# Patient Record
Sex: Female | Born: 1955
Health system: Southern US, Community
[De-identification: ages and names within clinical notes are randomized; demographics above are authoritative.]

## PROBLEM LIST (undated history)

## (undated) DIAGNOSIS — F419 Anxiety disorder, unspecified: Secondary | ICD-10-CM

## (undated) DIAGNOSIS — F329 Major depressive disorder, single episode, unspecified: Secondary | ICD-10-CM

## (undated) DIAGNOSIS — G2581 Restless legs syndrome: Secondary | ICD-10-CM

## (undated) DIAGNOSIS — J432 Centrilobular emphysema: Secondary | ICD-10-CM

## (undated) DIAGNOSIS — K219 Gastro-esophageal reflux disease without esophagitis: Secondary | ICD-10-CM

## (undated) DIAGNOSIS — M1990 Unspecified osteoarthritis, unspecified site: Secondary | ICD-10-CM

## (undated) DIAGNOSIS — Z973 Presence of spectacles and contact lenses: Secondary | ICD-10-CM

## (undated) DIAGNOSIS — R0602 Shortness of breath: Secondary | ICD-10-CM

## (undated) DIAGNOSIS — J439 Emphysema, unspecified: Secondary | ICD-10-CM

## (undated) DIAGNOSIS — M51369 Other intervertebral disc degeneration, lumbar region without mention of lumbar back pain or lower extremity pain: Secondary | ICD-10-CM

## (undated) DIAGNOSIS — J302 Other seasonal allergic rhinitis: Secondary | ICD-10-CM

## (undated) DIAGNOSIS — M5136 Other intervertebral disc degeneration, lumbar region: Secondary | ICD-10-CM

## (undated) DIAGNOSIS — Z923 Personal history of irradiation: Secondary | ICD-10-CM

## (undated) DIAGNOSIS — J449 Chronic obstructive pulmonary disease, unspecified: Secondary | ICD-10-CM

## (undated) DIAGNOSIS — N951 Menopausal and female climacteric states: Secondary | ICD-10-CM

## (undated) DIAGNOSIS — D219 Benign neoplasm of connective and other soft tissue, unspecified: Secondary | ICD-10-CM

## (undated) DIAGNOSIS — T7840XA Allergy, unspecified, initial encounter: Secondary | ICD-10-CM

## (undated) DIAGNOSIS — R0609 Other forms of dyspnea: Secondary | ICD-10-CM

## (undated) DIAGNOSIS — R0600 Dyspnea, unspecified: Secondary | ICD-10-CM

## (undated) DIAGNOSIS — L589 Radiodermatitis, unspecified: Secondary | ICD-10-CM

## (undated) HISTORY — DX: Allergy, unspecified, initial encounter: T78.40XA

## (undated) HISTORY — DX: Menopausal and female climacteric states: N95.1

## (undated) HISTORY — DX: Benign neoplasm of connective and other soft tissue, unspecified: D21.9

## (undated) HISTORY — DX: Unspecified osteoarthritis, unspecified site: M19.90

## (undated) HISTORY — PX: ANTERIOR CERVICAL DECOMP/DISCECTOMY FUSION: SHX1161

## (undated) HISTORY — DX: Anxiety disorder, unspecified: F41.9

## (undated) HISTORY — PX: COLONOSCOPY: SHX174

---

## 1998-09-12 DIAGNOSIS — M1990 Unspecified osteoarthritis, unspecified site: Secondary | ICD-10-CM

## 1998-09-12 HISTORY — DX: Unspecified osteoarthritis, unspecified site: M19.90

## 2001-09-12 DIAGNOSIS — D219 Benign neoplasm of connective and other soft tissue, unspecified: Secondary | ICD-10-CM

## 2001-09-12 HISTORY — DX: Benign neoplasm of connective and other soft tissue, unspecified: D21.9

## 2001-09-12 HISTORY — PX: NECK SURGERY: SHX720

## 2002-01-21 ENCOUNTER — Emergency Department (HOSPITAL_COMMUNITY): Admission: EM | Admit: 2002-01-21 | Discharge: 2002-01-21 | Payer: Self-pay | Admitting: Emergency Medicine

## 2002-01-21 ENCOUNTER — Encounter: Payer: Self-pay | Admitting: Emergency Medicine

## 2002-06-13 ENCOUNTER — Emergency Department (HOSPITAL_COMMUNITY): Admission: EM | Admit: 2002-06-13 | Discharge: 2002-06-13 | Payer: Self-pay | Admitting: Emergency Medicine

## 2002-09-30 ENCOUNTER — Other Ambulatory Visit: Admission: RE | Admit: 2002-09-30 | Discharge: 2002-09-30 | Payer: Self-pay | Admitting: Obstetrics and Gynecology

## 2002-12-06 ENCOUNTER — Inpatient Hospital Stay (HOSPITAL_COMMUNITY): Admission: RE | Admit: 2002-12-06 | Discharge: 2002-12-07 | Payer: Self-pay | Admitting: Neurosurgery

## 2002-12-06 ENCOUNTER — Encounter: Payer: Self-pay | Admitting: Neurosurgery

## 2003-01-21 ENCOUNTER — Encounter: Admission: RE | Admit: 2003-01-21 | Discharge: 2003-01-21 | Payer: Self-pay | Admitting: Neurosurgery

## 2003-01-21 ENCOUNTER — Encounter: Payer: Self-pay | Admitting: Neurosurgery

## 2003-04-08 ENCOUNTER — Encounter: Admission: RE | Admit: 2003-04-08 | Discharge: 2003-05-16 | Payer: Self-pay | Admitting: *Deleted

## 2004-06-29 ENCOUNTER — Emergency Department (HOSPITAL_COMMUNITY): Admission: EM | Admit: 2004-06-29 | Discharge: 2004-06-29 | Payer: Self-pay | Admitting: Emergency Medicine

## 2005-01-23 ENCOUNTER — Emergency Department (HOSPITAL_COMMUNITY): Admission: EM | Admit: 2005-01-23 | Discharge: 2005-01-23 | Payer: Self-pay | Admitting: Emergency Medicine

## 2005-02-01 ENCOUNTER — Ambulatory Visit: Payer: Self-pay | Admitting: Internal Medicine

## 2005-02-09 ENCOUNTER — Ambulatory Visit: Payer: Self-pay | Admitting: Internal Medicine

## 2005-02-16 ENCOUNTER — Ambulatory Visit (HOSPITAL_COMMUNITY): Admission: RE | Admit: 2005-02-16 | Discharge: 2005-02-16 | Payer: Self-pay | Admitting: Family Medicine

## 2005-05-30 ENCOUNTER — Emergency Department (HOSPITAL_COMMUNITY): Admission: EM | Admit: 2005-05-30 | Discharge: 2005-05-30 | Payer: Self-pay | Admitting: Emergency Medicine

## 2005-07-04 ENCOUNTER — Ambulatory Visit: Payer: Self-pay | Admitting: Internal Medicine

## 2006-01-05 ENCOUNTER — Ambulatory Visit: Payer: Self-pay | Admitting: Internal Medicine

## 2006-01-09 ENCOUNTER — Ambulatory Visit: Payer: Self-pay | Admitting: Internal Medicine

## 2006-01-12 ENCOUNTER — Ambulatory Visit: Payer: Self-pay | Admitting: *Deleted

## 2006-02-09 ENCOUNTER — Ambulatory Visit: Payer: Self-pay | Admitting: Internal Medicine

## 2006-04-21 ENCOUNTER — Ambulatory Visit: Payer: Self-pay | Admitting: Internal Medicine

## 2006-04-21 ENCOUNTER — Ambulatory Visit (HOSPITAL_COMMUNITY): Admission: RE | Admit: 2006-04-21 | Discharge: 2006-04-21 | Payer: Self-pay | Admitting: Internal Medicine

## 2006-04-28 ENCOUNTER — Ambulatory Visit: Payer: Self-pay | Admitting: Internal Medicine

## 2006-05-04 ENCOUNTER — Ambulatory Visit (HOSPITAL_COMMUNITY): Admission: RE | Admit: 2006-05-04 | Discharge: 2006-05-04 | Payer: Self-pay | Admitting: Internal Medicine

## 2006-05-12 ENCOUNTER — Emergency Department (HOSPITAL_COMMUNITY): Admission: EM | Admit: 2006-05-12 | Discharge: 2006-05-12 | Payer: Self-pay | Admitting: Emergency Medicine

## 2006-05-22 ENCOUNTER — Ambulatory Visit: Payer: Self-pay | Admitting: Internal Medicine

## 2006-05-22 ENCOUNTER — Ambulatory Visit (HOSPITAL_COMMUNITY): Admission: RE | Admit: 2006-05-22 | Discharge: 2006-05-22 | Payer: Self-pay | Admitting: Internal Medicine

## 2006-06-05 ENCOUNTER — Ambulatory Visit: Payer: Self-pay | Admitting: Internal Medicine

## 2006-08-11 ENCOUNTER — Ambulatory Visit: Payer: Self-pay | Admitting: Internal Medicine

## 2006-09-08 ENCOUNTER — Ambulatory Visit: Payer: Self-pay | Admitting: Internal Medicine

## 2006-10-12 ENCOUNTER — Ambulatory Visit: Payer: Self-pay | Admitting: Internal Medicine

## 2006-10-25 ENCOUNTER — Ambulatory Visit: Payer: Self-pay | Admitting: Internal Medicine

## 2007-01-01 ENCOUNTER — Encounter (INDEPENDENT_AMBULATORY_CARE_PROVIDER_SITE_OTHER): Payer: Self-pay | Admitting: *Deleted

## 2007-01-01 ENCOUNTER — Ambulatory Visit: Payer: Self-pay | Admitting: Internal Medicine

## 2007-03-01 ENCOUNTER — Emergency Department (HOSPITAL_COMMUNITY): Admission: EM | Admit: 2007-03-01 | Discharge: 2007-03-02 | Payer: Self-pay | Admitting: Emergency Medicine

## 2007-03-01 ENCOUNTER — Emergency Department (HOSPITAL_COMMUNITY): Admission: EM | Admit: 2007-03-01 | Discharge: 2007-03-01 | Payer: Self-pay | Admitting: Emergency Medicine

## 2007-03-09 ENCOUNTER — Ambulatory Visit: Payer: Self-pay | Admitting: Internal Medicine

## 2007-08-16 ENCOUNTER — Emergency Department (HOSPITAL_COMMUNITY): Admission: EM | Admit: 2007-08-16 | Discharge: 2007-08-16 | Payer: Self-pay | Admitting: Emergency Medicine

## 2007-09-13 DIAGNOSIS — F419 Anxiety disorder, unspecified: Secondary | ICD-10-CM

## 2007-09-13 HISTORY — DX: Anxiety disorder, unspecified: F41.9

## 2008-04-11 ENCOUNTER — Ambulatory Visit: Payer: Self-pay | Admitting: Internal Medicine

## 2008-04-14 ENCOUNTER — Ambulatory Visit: Payer: Self-pay | Admitting: Internal Medicine

## 2008-10-16 ENCOUNTER — Ambulatory Visit: Payer: Self-pay | Admitting: Internal Medicine

## 2008-10-28 ENCOUNTER — Ambulatory Visit (HOSPITAL_COMMUNITY): Admission: RE | Admit: 2008-10-28 | Discharge: 2008-10-28 | Payer: Self-pay | Admitting: Family Medicine

## 2008-11-05 ENCOUNTER — Encounter: Admission: RE | Admit: 2008-11-05 | Discharge: 2008-12-08 | Payer: Self-pay | Admitting: Internal Medicine

## 2008-12-22 ENCOUNTER — Emergency Department (HOSPITAL_COMMUNITY): Admission: EM | Admit: 2008-12-22 | Discharge: 2008-12-22 | Payer: Self-pay | Admitting: Emergency Medicine

## 2009-01-14 ENCOUNTER — Ambulatory Visit: Payer: Self-pay | Admitting: Internal Medicine

## 2009-01-26 ENCOUNTER — Ambulatory Visit: Payer: Self-pay | Admitting: Internal Medicine

## 2009-02-03 ENCOUNTER — Ambulatory Visit: Payer: Self-pay | Admitting: Internal Medicine

## 2009-05-06 ENCOUNTER — Other Ambulatory Visit: Admission: RE | Admit: 2009-05-06 | Discharge: 2009-05-06 | Payer: Self-pay | Admitting: Obstetrics and Gynecology

## 2009-05-06 ENCOUNTER — Ambulatory Visit: Payer: Self-pay | Admitting: Obstetrics and Gynecology

## 2009-05-20 ENCOUNTER — Ambulatory Visit: Payer: Self-pay | Admitting: Obstetrics and Gynecology

## 2009-07-14 ENCOUNTER — Telehealth (INDEPENDENT_AMBULATORY_CARE_PROVIDER_SITE_OTHER): Payer: Self-pay | Admitting: *Deleted

## 2009-07-14 ENCOUNTER — Inpatient Hospital Stay (HOSPITAL_COMMUNITY): Admission: AD | Admit: 2009-07-14 | Discharge: 2009-07-14 | Payer: Self-pay | Admitting: Obstetrics & Gynecology

## 2009-09-01 ENCOUNTER — Ambulatory Visit: Payer: Self-pay | Admitting: Obstetrics & Gynecology

## 2009-09-24 ENCOUNTER — Ambulatory Visit: Payer: Self-pay | Admitting: Obstetrics and Gynecology

## 2009-11-17 ENCOUNTER — Ambulatory Visit: Payer: Self-pay | Admitting: Obstetrics & Gynecology

## 2010-02-02 ENCOUNTER — Ambulatory Visit: Payer: Self-pay | Admitting: Obstetrics & Gynecology

## 2010-03-10 ENCOUNTER — Ambulatory Visit: Payer: Self-pay | Admitting: Obstetrics and Gynecology

## 2010-03-11 ENCOUNTER — Encounter: Payer: Self-pay | Admitting: Obstetrics and Gynecology

## 2010-03-11 LAB — CONVERTED CEMR LAB: LH: 16.8 milliintl units/mL

## 2010-03-22 ENCOUNTER — Ambulatory Visit: Payer: Self-pay | Admitting: Family Medicine

## 2010-04-28 ENCOUNTER — Ambulatory Visit: Payer: Self-pay | Admitting: Obstetrics and Gynecology

## 2010-05-28 ENCOUNTER — Ambulatory Visit: Payer: Self-pay | Admitting: Obstetrics & Gynecology

## 2010-06-28 ENCOUNTER — Encounter: Payer: Self-pay | Admitting: Obstetrics & Gynecology

## 2010-06-28 ENCOUNTER — Ambulatory Visit: Payer: Self-pay | Admitting: Obstetrics & Gynecology

## 2010-06-28 LAB — CONVERTED CEMR LAB: Prolactin: 10.6 ng/mL

## 2010-07-05 ENCOUNTER — Ambulatory Visit (HOSPITAL_COMMUNITY)
Admission: RE | Admit: 2010-07-05 | Discharge: 2010-07-05 | Payer: Self-pay | Source: Home / Self Care | Admitting: Obstetrics & Gynecology

## 2010-07-12 ENCOUNTER — Ambulatory Visit: Payer: Self-pay | Admitting: Family Medicine

## 2010-08-26 ENCOUNTER — Ambulatory Visit: Payer: Self-pay | Admitting: Obstetrics and Gynecology

## 2010-11-12 ENCOUNTER — Ambulatory Visit: Payer: Self-pay

## 2010-11-17 ENCOUNTER — Ambulatory Visit: Payer: Self-pay | Admitting: Obstetrics and Gynecology

## 2010-11-24 LAB — POCT PREGNANCY, URINE: Preg Test, Ur: NEGATIVE

## 2010-12-10 ENCOUNTER — Ambulatory Visit (INDEPENDENT_AMBULATORY_CARE_PROVIDER_SITE_OTHER): Payer: Self-pay | Admitting: Physician Assistant

## 2010-12-10 DIAGNOSIS — N949 Unspecified condition associated with female genital organs and menstrual cycle: Secondary | ICD-10-CM

## 2010-12-11 NOTE — Progress Notes (Signed)
NAME:  [name], [name] NO.:  [id number]  MEDICAL RECORD NO.:  [id number]           PATIENT TYPE:  A  LOCATION:  [facility]                   FACILITY:  [facility]  PHYSICIAN:  Maylon Cos, CNM    DATE OF BIRTH:  [birth year]  DATE OF SERVICE:  12/10/2010                                 CLINIC NOTE  The patient is being seen in GYN Clinic at [facility].  REASON FOR TODAY'S VISIT:  Followup for dysfunctional uterine bleeding and Depo-Provera.  HISTORY OF PRESENT ILLNESS:  The patient is a 55 year old gravida 2 para 2-0-0-2 who presents for followup for dysfunctional bleeding.  She has been on Depo-Provera to control this dysfunctional bleeding for the past 6 months, and she is having suboptimal relief of her symptoms.  She states that after her last Depo shot, her bleeding stopped for one week and then she bled for 2 months.  On average during that 26-month time period, she was changing pads up to 3 times a day and at sometimes, she was passing clots that were quarter sized.  She is desiring to try another option for her dysfunctional uterine bleeding and is very interested in the Mirena IUD.  PHYSICAL EXAMINATION TODAY:  GENERAL:  [name] is a pleasant 55 year old African American female, in no apparent distress. HEENT:  Grossly normal. VITAL SIGNS:  Stable and noted in her chart. NEUROLOGIC:  Cranial nerves are grossly intact, and she is appropriate during the length of our conversation.  ASSESSMENT:  Dysfunctional uterine bleeding.  PLAN: 1. The patient will receive 150 mg Depo-Provera today while she is     waiting for financial paperwork to be completed for qualification     for Mirena IUD. 2. Prescription is given for refill of buspirone 5 mg daily which was     originally prescribed for her anxiety and depression and is working     well for her. 3. The patient should follow up when Mirena IUD is received so it     could be placed.  Given that  this will take 4-6 weeks, the patient     will receive another Depo-Provera injection today.  She should     follow up at that time for her insertion of Mirena IUD or p.r.n.     problems or increased bleeding between now and the insertion.          ______________________________ Maylon Cos, CNM    SS/MEDQ  D:  12/10/2010  T:  12/11/2010  Job:  [id number]

## 2010-12-15 LAB — GC/CHLAMYDIA PROBE AMP, GENITAL
Chlamydia, DNA Probe: NEGATIVE
GC Probe Amp, Genital: NEGATIVE

## 2010-12-15 LAB — CBC
HCT: 36.8 % (ref 36.0–46.0)
MCHC: 33.3 g/dL (ref 30.0–36.0)
WBC: 10.7 10*3/uL — ABNORMAL HIGH (ref 4.0–10.5)

## 2010-12-15 LAB — WET PREP, GENITAL
Trich, Wet Prep: NONE SEEN
Yeast Wet Prep HPF POC: NONE SEEN

## 2010-12-17 LAB — POCT PREGNANCY, URINE: Preg Test, Ur: NEGATIVE

## 2010-12-22 LAB — POCT I-STAT, CHEM 8
BUN: 13 mg/dL (ref 6–23)
Chloride: 107 mEq/L (ref 96–112)
Glucose, Bld: 96 mg/dL (ref 70–99)
Hemoglobin: 13.3 g/dL (ref 12.0–15.0)
Sodium: 138 mEq/L (ref 135–145)
TCO2: 25 mmol/L (ref 0–100)

## 2010-12-22 LAB — DIFFERENTIAL
Basophils Absolute: 0.1 10*3/uL (ref 0.0–0.1)
Basophils Relative: 1 % (ref 0–1)
Eosinophils Relative: 2 % (ref 0–5)
Monocytes Absolute: 0.7 10*3/uL (ref 0.1–1.0)
Monocytes Relative: 6 % (ref 3–12)

## 2010-12-22 LAB — CBC
Hemoglobin: 12.5 g/dL (ref 12.0–15.0)
RBC: 4.27 MIL/uL (ref 3.87–5.11)
RDW: 13.8 % (ref 11.5–15.5)

## 2010-12-22 LAB — WET PREP, GENITAL
Clue Cells Wet Prep HPF POC: NONE SEEN
Yeast Wet Prep HPF POC: NONE SEEN

## 2011-01-28 NOTE — Op Note (Signed)
NAME:  [name], [name] NO.:  [id number]   MEDICAL RECORD NO.:  [id number]                   PATIENT TYPE:  INP   LOCATION:  3031                                 FACILITY:  [facility]   PHYSICIAN:  Kathaleen Maser. Pool, M.D.                 DATE OF BIRTH:  03/05/1956   DATE OF PROCEDURE:  12/06/2002  DATE OF DISCHARGE:  12/07/2002                                 OPERATIVE REPORT   PREOPERATIVE DIAGNOSIS:  C3-4, C4-5, and C5-6 stenosis with myelopathy.   POSTOPERATIVE DIAGNOSIS:  C3-4, C4-5, and C5-6 stenosis with myelopathy.   OPERATION/PROCEDURE:  C3-4, C4-5 and C5-6 anterior cervical fusion with  allograft anterior plating.   SURGEON:  Kathaleen Maser. Pool, M.D.   ASSISTANT:  Izell Waller. Elesa Hacker, M.D.   ANESTHESIA:  General endotracheal anesthesia.   INDICATION:  [name] is a 55 year old female with a history of neck and  bilateral upper extremity symptoms, right greater than left, consistent with  a progressive cervical myelopathy.  MRI scanning demonstrates evidence of a  rather large rightward C3-4 disk herniation with a spinal cord compression.  The patient has marked spondylosis at C4-5 and C5-6, also causing spinal  cord compression.  We have discussed options of her management including the  possibility of undergoing a C3-4, C4-5, and C5-6 anterior cervical fusion  with allograft anterior plating for hopeful improvement of her symptoms.  The patient is aware of the risks and benefits and wishes to proceed.   OPERATIVE NOTE:  The patient was brought to the operating room and placed on  the table in the supine position.  After adequate anesthesia was achieved,  the patient was supine with her neck slightly extended and held in place  with halter traction.  The patient's anterior cervical region was prepped  and draped sterilely.  A #10 blade knife was used to make a linear skin  incision overlying the C4-5 vertebral level.  This was carried down sharply  to the platysma.  Platysma was then divided vertically and dissection  proceeded along the medial border of the sternocleidomastoid muscle and  carotid sheath.  Trachea and esophagus were mobilized and retracted towards  the left.  Prevertebral fascia was stripped off the anterior spinal column.  The longus colli muscles were then elevated bilaterally using  electrocautery.  Deep self-retaining retractors were placed.  Intraoperative  fluoroscopy was used and the levels were confirmed.  Disk spaces at C3-4, C4-  5, and C5-6 were then incised with a 15 blade to the level of the fascia  where disk space clean out was then achieved using pituitary rongeurs  forward and back without any problems, with Kerrison rongeurs and a high-  speed drill.  All remnants of the disk were removed down to the level of the  posterior annulus.  Microscope was brought onto the field and used  throughout the remainder of  the dissection.  The remaining aspects of the  annulus and osteophytes were removed using the high-speed drill down to the  level of the posterior longitudinal ligament.  The posterior longitudinal  ligament was then elevated and resected in the usual fashion using Kerrison  rongeurs.  Starting with C3-4, wide central decompression was then performed  by elevating the ligament and undercutting the vertebral bodies of C3 and C4  to provide a wide central decompression.  Decompression then proceeded out  to each neural foramen.  A large amount of rightward disk herniation was  encountered and was completely resected.  The procedure was then repeated at  C4-5 and C5-6, once again performing wide decompression using the high-speed  drill and then later Kerrison rongeurs.  After all three levels were well  decompressed, the thecal sac and nerve roots were all widely patent at each  level.  The wound was then irrigated with antibiotic solution.  Dressing was  placed topically for hemostasis.  A 6 mm  patellar wedge allograft was then  placed at C3-4 and recessed approximately 1 mm from the anterior cortical  surface.  A 6-mm graft was placed at C4-5 and a 7-mm graft was placed at C5-  6.  A 55-mm outlet of anterior cervical plate was then placed under  fluoroscopic guidance.  13 mm  __________  screws were used at all locations  except at C3 where 14 mm __________  screws were used.  All eight screws  were given a final tightening and found to be solid within bone.  Locking  screws were engaged at all four levels.  Final images revealed good position  of bone graft fill-up as well as normal alignment of the spine.  Retractor  system was removed.  Hemostasis of the muscles was achieved with bipolar  electrocautery.  Wound was then irrigated one final time and then closed in  typical fashion.  Steri-Strips and a sterile dressing were applied.  There  were no intraoperative complications.  The patient tolerated the procedure  well and returns to the recovery room postoperative.                                                Henry A. Pool, M.D.    HAP/MEDQ  D:  12/06/2002  T:  12/07/2002  Job:  [id number]

## 2011-02-25 ENCOUNTER — Ambulatory Visit: Payer: PRIVATE HEALTH INSURANCE

## 2011-02-25 DIAGNOSIS — Z3049 Encounter for surveillance of other contraceptives: Secondary | ICD-10-CM

## 2011-04-14 ENCOUNTER — Telehealth: Payer: Self-pay | Admitting: Obstetrics and Gynecology

## 2011-04-14 NOTE — Telephone Encounter (Signed)
Patient requesting for an Inhaler for shortness of breath whenever she goes to a funeral. She will be going this Monday. Advised patient that she needs to get this from her primary care doctor. Patient agreed.

## 2011-05-13 ENCOUNTER — Telehealth: Payer: Self-pay | Admitting: *Deleted

## 2011-05-13 NOTE — Telephone Encounter (Signed)
Pt called at 0723 and left message can't make appointment this am for 0800, and needs to change. Per front office pt already called again and appt changed to 05/20/11

## 2011-05-20 ENCOUNTER — Ambulatory Visit: Payer: PRIVATE HEALTH INSURANCE

## 2011-05-23 ENCOUNTER — Ambulatory Visit: Payer: PRIVATE HEALTH INSURANCE

## 2011-05-26 ENCOUNTER — Other Ambulatory Visit: Payer: Self-pay | Admitting: Obstetrics & Gynecology

## 2011-05-30 ENCOUNTER — Ambulatory Visit: Payer: PRIVATE HEALTH INSURANCE

## 2011-08-19 ENCOUNTER — Other Ambulatory Visit: Payer: Self-pay | Admitting: Physician Assistant

## 2011-08-22 ENCOUNTER — Telehealth: Payer: Self-pay | Admitting: *Deleted

## 2011-08-22 MED ORDER — GABAPENTIN 300 MG PO CAPS: 300.0000 mg | ORAL_CAPSULE | Freq: Two times a day (BID) | ORAL | Status: DC

## 2011-08-22 NOTE — Telephone Encounter (Signed)
Pt left message stating that she would like a Rx for Gabapentin. Her next appt is 10/06/11 and she cannot wait until then for a refill. Pt wants to be called back @ tel # [phone].   I obtained refill order from [facility] CNM. I called pt and informed her that the medication will be ordered- pt requested a different pharmacy location.  I also explained that her condition of Restless leg Syndrome is not a Gyn problem and therefore we will not continue to manage or treat the condition. She will need to seek the care of a PCP for on-going care. I inquired if she is having problems with her DUB and she said she is not. I told pt that she will not need her appt on 10/06/11 since this is the last refill of Gabapentin that our providers will order. Pt voiced understanding.

## 2011-10-06 ENCOUNTER — Ambulatory Visit: Payer: PRIVATE HEALTH INSURANCE | Admitting: Obstetrics and Gynecology

## 2011-12-06 ENCOUNTER — Other Ambulatory Visit: Payer: Self-pay | Admitting: Physician Assistant

## 2012-01-06 ENCOUNTER — Emergency Department (HOSPITAL_COMMUNITY)
Admission: EM | Admit: 2012-01-06 | Discharge: 2012-01-06 | Disposition: A | Discharge status: Home / Self Care | Payer: PRIVATE HEALTH INSURANCE | Attending: Emergency Medicine | Admitting: Emergency Medicine

## 2012-01-06 ENCOUNTER — Encounter (HOSPITAL_COMMUNITY): Payer: Self-pay | Admitting: Emergency Medicine

## 2012-01-06 DIAGNOSIS — R059 Cough, unspecified: Secondary | ICD-10-CM | POA: Insufficient documentation

## 2012-01-06 DIAGNOSIS — J069 Acute upper respiratory infection, unspecified: Secondary | ICD-10-CM

## 2012-01-06 DIAGNOSIS — R05 Cough: Secondary | ICD-10-CM

## 2012-01-06 DIAGNOSIS — J029 Acute pharyngitis, unspecified: Secondary | ICD-10-CM | POA: Insufficient documentation

## 2012-01-06 LAB — RAPID STREP SCREEN (MED CTR MEBANE ONLY): Streptococcus, Group A Screen (Direct): NEGATIVE

## 2012-01-06 MED ORDER — BENZONATATE 100 MG PO CAPS
200.0000 mg | ORAL_CAPSULE | Freq: Once | ORAL | Status: AC
  Administered 2012-01-06: 200 mg via ORAL
  Filled 2012-01-06: qty 2

## 2012-01-06 MED ORDER — GI COCKTAIL ~~LOC~~
30.0000 mL | Freq: Once | ORAL | Status: AC
  Administered 2012-01-06: 30 mL via ORAL
  Filled 2012-01-06: qty 30

## 2012-01-06 MED ORDER — PSEUDOEPHEDRINE HCL ER 120 MG PO TB12: 120.0000 mg | ORAL_TABLET | Freq: Two times a day (BID) | ORAL | Status: DC

## 2012-01-06 MED ORDER — HYDROCOD POLST-CHLORPHEN POLST 10-8 MG/5ML PO LQCR
5.0000 mL | Freq: Once | ORAL | Status: AC
  Administered 2012-01-06: 5 mL via ORAL
  Filled 2012-01-06: qty 5

## 2012-01-06 MED ORDER — HYDROCOD POLST-CHLORPHEN POLST 10-8 MG/5ML PO LQCR: 5.0000 mL | Freq: Two times a day (BID) | ORAL | Status: DC | PRN

## 2012-01-06 MED ORDER — BENZONATATE 100 MG PO CAPS: 100.0000 mg | ORAL_CAPSULE | Freq: Three times a day (TID) | ORAL | Status: AC

## 2012-01-06 NOTE — ED Notes (Signed)
Pt reports cough, sore throat, and left ear ache for over one week. Not using over the counter remedies for these symptoms. No PCP per pt family.

## 2012-01-06 NOTE — ED Notes (Signed)
Pt ambulates out states "I'll be right back".

## 2012-01-06 NOTE — ED Notes (Signed)
Patient back in room

## 2012-01-06 NOTE — ED Notes (Signed)
MD at bedside. 

## 2012-01-06 NOTE — ED Notes (Signed)
Patient ambulatory with steady gait. Respirations equal and unlabored. Skin warm and dry. No acute distress noted. 

## 2012-01-06 NOTE — Discharge Instructions (Signed)
Please take medications as prescribed. Follow up with your doctor for recheck in 2-3 days. If you do not have a doctor, use one of the numbers listed below   RESOURCE GUIDE  Dental Problems  Patients with Medicaid: [facility] [phone] [address]                                                                   [phone] [address] Phone:  [phone]                                                                             Phone:  [phone]  If unable to pay or uninsured, contact:  [facility] or [facility]. to become qualified for the adult dental clinic.  Chronic Pain Problems Contact [facility]  [phone] Patients need to be referred by their primary care doctor.  Insufficient Money for Medicine Contact [employer]:  call "211" or [facility] 609-104-7262.  No Primary Care Doctor Call [facility]  [phone] Other agencies that provide inexpensive medical care    [facility]  [phone]    [facility]  [phone]    [facility]  [phone]    [facility]  [phone]    [facility]  [phone]    [facility]  [phone]  Psychological Services [facility]  [phone] [facility]  [phone] [facility]   [phone] (emergency services [phone])  Abuse/Neglect [facility] Child Abuse Hotline 616-018-5711 [facility] Child Abuse Hotline (757)225-7763 (After Hours)  Emergency Shelter [facility] [phone]  Maternity Homes [facility] at the [place] [facility] [phone] [facility] ([phone]  MRSA Hotline #:   940-076-1330  [facility] Resources  [facility] [place]     [employer]                          [facility] Dept. [address] [place]                        [address]           [address]  Patrecia Pace  Michell Heinrich Phone:  [phone]                                     Phone:  [phone]                   Phone:  [phone]  [facility] Mental Health Phone:  [phone]  [facility] Child Abuse Hotline (321)724-7229 304-305-8501 (After Hours)       *free text    If you develop symptoms of Shortness of Breath, Chest Pain, Swelling of lips, mouth or tongue or if your condition becomes worse with any new symptoms, see your doctor or return to the Emergency Department for immediate care. Emergency services are not intended to be a substitute for comprehensive medical attention.  Please contact your doctor for follow up if not improving as expected.   Call your doctor in 5-7 days or as directed if there is no improvement.   Community Resources: *IF YOU ARE IN IMMEDIATE DANGER CALL 911!  Abuse/Neglect:  Family Services Crisis Hotline [facility]): 732-747-1202 Center Against Violence [facility]): 843-242-6001  After hours, holidays and weekends: (252)077-9074 National Domestic Violence Hotline: 340-629-0150  Mental Health: [facility] Mental Health: Drucie Ip: 4704237881  Health Clinics:  Urgent [facility] Center [facility]): [phone] Monday - Friday 8 AM - 9 PM, Saturday and Sunday 10 AM - 9 PM  [facility] [place]: [phone] Monday - Friday 8 AM - 5 PM  [facility]  [address]: [phone] Monday- Friday 8:30 AM - 5:30 PM, Sat 9 AM - 1 PM  24 HR [place] [facility] [address]: [phone] [facility]: [phone] [facility] [address]: [phone]  24 HR [facility]: [address] [phone]  Cough, Adult  A cough is a reflex that helps clear your throat and airways. It can help heal the body or may be a reaction to an  irritated airway. A cough may only last 2 or 3 weeks (acute) or may last more than 8 weeks (chronic).  CAUSES Acute cough:  Viral or bacterial infections.  Chronic cough:  Infections.   Allergies.   Asthma.   Post-nasal drip.   Smoking.   Heartburn or acid reflux.   Some medicines.   Chronic lung problems (COPD).   Cancer.  SYMPTOMS   Cough.   Fever.   Chest pain.   Increased breathing rate.   High-pitched whistling sound when breathing (wheezing).   Colored mucus that you cough up (sputum).  TREATMENT   A bacterial cough may be treated with antibiotic medicine.   A viral cough must run its course and will not respond to antibiotics.   Your caregiver may recommend other treatments if you have a chronic cough.  HOME CARE INSTRUCTIONS   Only take over-the-counter or prescription medicines for pain, discomfort, or fever as directed by your caregiver. Use cough suppressants only as directed by your caregiver.   Use a cold steam vaporizer or humidifier in your bedroom or home to help loosen secretions.   Sleep in a semi-upright position if your cough is worse at night.   Rest as needed.   Stop smoking if you smoke.  SEEK IMMEDIATE MEDICAL CARE IF:   You have pus in your sputum.   Your cough starts to worsen.  You cannot control your cough with suppressants and are losing sleep.   You begin coughing up blood.   You have difficulty breathing.   You develop pain which is getting worse or is uncontrolled with medicine.   You have a fever.  MAKE SURE YOU:   Understand these instructions.   Will watch your condition.   Will get help right away if you are not doing well or get worse.  Document Released: 02/25/2011 Document Revised: 08/18/2011 Document Reviewed: 02/25/2011 [facility] Patient Information 2012 [place], [place].  Upper Respiratory Infection, Adult An upper respiratory infection (URI) is also known as the common cold. It is often caused  by a type of germ (virus). Colds are easily spread (contagious). You can pass it to others by kissing, coughing, sneezing, or drinking out of the same glass. Usually, you get better in 1 or 2 weeks.  HOME CARE   Only take medicine as told by your doctor.   Use a warm mist humidifier or breathe in steam from a hot shower.   Drink enough water and fluids to keep your pee (urine) clear or pale yellow.   Get plenty of rest.   Return to work when your temperature is back to normal or as told by your doctor. You may use a face mask and wash your hands to stop your cold from spreading.  GET HELP RIGHT AWAY IF:   After the first few days, you feel you are getting worse.   You have questions about your medicine.   You have chills, shortness of breath, or brown or red spit (mucus).   You have yellow or brown snot (nasal discharge) or pain in the face, especially when you bend forward.   You have a fever, puffy (swollen) neck, pain when you swallow, or white spots in the back of your throat.   You have a bad headache, ear pain, sinus pain, or chest pain.   You have a high-pitched whistling sound when you breathe in and out (wheezing).   You have a lasting cough or cough up blood.   You have sore muscles or a stiff neck.  MAKE SURE YOU:   Understand these instructions.   Will watch your condition.   Will get help right away if you are not doing well or get worse.  Document Released: 02/15/2008 Document Revised: 08/18/2011 Document Reviewed: 01/03/2011 [facility] Patient Information 2012 [place], [place].

## 2012-01-06 NOTE — ED Notes (Addendum)
Pt presented to the ER with c/o sore throat that is lasting for about week now, also pt reports s/s of otalgia, states started few days ago, pt further explains that sx worsen and that sore throat become more uncomfortable. Pt able to communicate, no apparent airway obstruction present, noted non productive cough as well.

## 2012-01-07 NOTE — ED Provider Notes (Signed)
History     CSN: [id number]  Arrival date & time 01/06/12  0043   First MD Initiated Contact with Patient 01/06/12 [phone]      Chief Complaint  Patient presents with  . Sore Throat  . Otalgia    (Consider location/radiation/quality/duration/timing/severity/associated sxs/prior treatment) HPI 56 year old female presents to emergency department complaining of 2 weeks of cough, left ear pain starting about one week ago, and sore throat. Patient denies any fevers. She's been taking over-the-counter cough remedies without improvement. Patient is a 1-2 pack-a-day smoker. History reviewed. No pertinent past medical history.  Past Surgical History  Procedure Date  . Neck surgery     History reviewed. No pertinent family history.  History  Substance Use Topics  . Smoking status: Current Everyday Smoker    Types: Cigarettes  . Smokeless tobacco: Not on file  . Alcohol Use: No    OB History    Grav Para Term Preterm Abortions TAB SAB Ect Mult Living                  Review of Systems  All other systems reviewed and are negative.    Allergies  Codeine  Home Medications   Current Outpatient Rx  Name Route Sig Dispense Refill  . GABAPENTIN 300 MG PO CAPS Oral Take 1 capsule (300 mg total) by mouth 2 (two) times daily. 60 capsule 1  . BENZONATATE 100 MG PO CAPS Oral Take 1 capsule (100 mg total) by mouth every 8 (eight) hours. 21 capsule 0  . HYDROCOD POLST-CPM POLST ER 10-8 MG/5ML PO LQCR Oral Take 5 mLs by mouth every 12 (twelve) hours as needed (cough). 140 mL 0  . PSEUDOEPHEDRINE HCL ER 120 MG PO TB12 Oral Take 1 tablet (120 mg total) by mouth every 12 (twelve) hours. 30 tablet 0    BP 145/80  Pulse 74  Temp(Src) 97.7 F (36.5 C) (Oral)  Resp 18  SpO2 100%  Physical Exam  Nursing note and vitals reviewed. Constitutional: She is oriented to person, place, and time. She appears well-developed and well-nourished.  HENT:  Head: Normocephalic and atraumatic.    Right Ear: External ear normal.  Left Ear: External ear normal.  Nose: Nose normal.  Mouth/Throat: Oropharynx is clear and moist.  Eyes: Conjunctivae and EOM are normal. Pupils are equal, round, and reactive to light.  Neck: Normal range of motion. Neck supple. No JVD present. No tracheal deviation present. No thyromegaly present.  Cardiovascular: Normal rate, regular rhythm, normal heart sounds and intact distal pulses.  Exam reveals no gallop and no friction rub.   No murmur heard. Pulmonary/Chest: Effort normal and breath sounds normal. No stridor. No respiratory distress. She has no wheezes. She has no rales. She exhibits no tenderness.  Abdominal: Soft. Bowel sounds are normal. She exhibits no distension and no mass. There is no tenderness. There is no rebound and no guarding.  Musculoskeletal: Normal range of motion. She exhibits no edema and no tenderness.  Lymphadenopathy:    She has no cervical adenopathy.  Neurological: She is oriented to person, place, and time. She exhibits normal muscle tone. Coordination normal.  Skin: Skin is dry. No rash noted. No erythema. No pallor.  Psychiatric: She has a normal mood and affect. Her behavior is normal. Judgment and thought content normal.    ED Course  Procedures (including critical care time)   Labs Reviewed  RAPID STREP SCREEN  LAB REPORT - SCANNED   No results found.   1.  Cough   2. Upper respiratory infection       MDM  56 year old female with sore throat cough ear pain. Physical exam unremarkable. Patient counseled on smoking cessation. Will symptomatically treat        Olivia Mackie, MD 01/07/12 [phone]

## 2012-02-28 ENCOUNTER — Ambulatory Visit (INDEPENDENT_AMBULATORY_CARE_PROVIDER_SITE_OTHER): Payer: PRIVATE HEALTH INSURANCE | Admitting: Family Medicine

## 2012-02-28 ENCOUNTER — Encounter: Payer: Self-pay | Admitting: Family Medicine

## 2012-02-28 VITALS — BP 118/62 | HR 84 | Temp 98.4°F | Ht 68.6 in | Wt 157.0 lb

## 2012-02-28 DIAGNOSIS — M7741 Metatarsalgia, right foot: Secondary | ICD-10-CM | POA: Insufficient documentation

## 2012-02-28 DIAGNOSIS — F411 Generalized anxiety disorder: Secondary | ICD-10-CM | POA: Insufficient documentation

## 2012-02-28 DIAGNOSIS — F419 Anxiety disorder, unspecified: Secondary | ICD-10-CM

## 2012-02-28 DIAGNOSIS — M7750 Other enthesopathy of unspecified foot: Secondary | ICD-10-CM

## 2012-02-28 DIAGNOSIS — N951 Menopausal and female climacteric states: Secondary | ICD-10-CM

## 2012-02-28 DIAGNOSIS — G2581 Restless legs syndrome: Secondary | ICD-10-CM

## 2012-02-28 HISTORY — DX: Generalized anxiety disorder: F41.1

## 2012-02-28 MED ORDER — VENLAFAXINE HCL ER 37.5 MG PO CP24: 37.5000 mg | ORAL_CAPSULE | Freq: Every day | ORAL | Status: DC

## 2012-02-28 MED ORDER — GABAPENTIN 300 MG PO CAPS: 300.0000 mg | ORAL_CAPSULE | Freq: Two times a day (BID) | ORAL | Status: DC

## 2012-02-28 NOTE — Progress Notes (Signed)
Subjective:     Patient ID: [name], female   DOB: 1955-10-29, 56 y.o.   MRN: [id number]  HPI 56 yo F presents as a new patient to establish primary care:   1. Anxiety: associated the deaths in the family/friends. Also associated with hot flashes, vaginal dryness and decreased sex drive.   2. Rest leg syndrome: hurts at night. Wakes her up from sleep. She has been out of gabapentin x 3 months.   3. Bilateral ball of foot pain: R>L. Worse since work for [employer] and being on her feet for > 12 hours at a time. Denies fracture or injury to her foot.   Past Medical History  Diagnosis Date  . Anxiety 2009  . Arthritis 2000  . Fibroid 2003  . Hot flashes, menopausal 2012   Past Surgical History  Procedure Date  . Neck surgery 2003    Cervical vertebroplasty    History   Social History  . Marital Status: Married    Spouse Name: [name]    Number of Children: N/A  . Years of Education: 12   Occupational History  . [employer]    Social History Main Topics  . Smoking status: Current Everyday Smoker -- 0.5 packs/day for 40 years    Types: Cigarettes  . Smokeless tobacco: Never Used  . Alcohol Use: No  . Drug Use: No  . Sexually Active: Yes    Birth Control/ Protection: None   Other Topics Concern  . Not on file   Social History Narrative   Lives with husband [name] (age 54).    Health maintenance: Pap smear: 2011 Mammogram: 2010 Colonoscopy: never had one   Review of Systems  Constitutional: Positive for chills, diaphoresis, activity change and unexpected weight change. Negative for fever, appetite change and fatigue.       Weight loss: working long hours.   HENT: Positive for congestion, rhinorrhea, sneezing, neck pain and neck stiffness. Negative for hearing loss, ear pain, nosebleeds, sore throat, facial swelling, drooling, mouth sores, trouble swallowing, dental problem, voice change, postnasal drip, sinus pressure, tinnitus and ear  discharge.   Eyes: Negative.   Respiratory: Positive for shortness of breath.   Cardiovascular: Positive for leg swelling. Negative for chest pain and palpitations.  Gastrointestinal: Negative.   Genitourinary: Positive for vaginal discharge, vaginal pain and menstrual problem. Negative for dysuria, urgency, frequency, hematuria, flank pain, decreased urine volume, vaginal bleeding, enuresis, difficulty urinating, genital sores, pelvic pain and dyspareunia.       Vaginal itching. Vaginal pain with intercourse. LMP: 02/2011  Musculoskeletal: Positive for back pain. Negative for myalgias, joint swelling, arthralgias and gait problem.       Sometimes with low back pain.   Skin: Negative.   Neurological: Negative.   Hematological: Negative.   Psychiatric/Behavioral: Positive for decreased concentration. Negative for suicidal ideas, hallucinations, behavioral problems, confusion, disturbed wake/sleep cycle, self-injury, dysphoric mood and agitation. The patient is nervous/anxious. The patient is not hyperactive.        Sometimes restless when trying to sleep.    Objective:   Physical Exam  Constitutional: She appears well-developed and well-nourished. No distress.  Eyes: Pupils are equal, round, and reactive to light.  Cardiovascular: Normal rate, regular rhythm and normal heart sounds.   Pulmonary/Chest: Effort normal and breath sounds normal.  Musculoskeletal: Normal range of motion. She exhibits no edema.       Tenderness on first metarsal head on R.  No erythema or skin breakdown.   Pes cavus bilaterally with compensatory spreading of first and second toe.   GAD 7: 0 to question 7. 1 to question 3,4 and 5.  2 to questions 1,2 and 6. Somewhat difficult to function.   Assessment and Plan:  See problem list

## 2012-02-28 NOTE — Patient Instructions (Addendum)
Dave,  Thank you very much for coming in to see me today.  For your anxiety and hot flashes: start effexor.  For rest less legs: restart gabapentin.  For ball of foot pain due to pressure from high arches and being on your feet for long hours.  -please go to sports medicine clinic for custom insole.   F/u in 1 month for well woman exam.   Dr. Armen Pickup

## 2012-02-28 NOTE — Assessment & Plan Note (Signed)
Restart gabapentin.

## 2012-02-28 NOTE — Assessment & Plan Note (Signed)
A: GAD 7 score of 9.  P: start effexor.

## 2012-02-28 NOTE — Assessment & Plan Note (Signed)
Referral to [facility] for custom insoles.

## 2012-02-28 NOTE — Assessment & Plan Note (Signed)
A: menopausal hot flashes.  P: start effexor.

## 2012-03-08 ENCOUNTER — Ambulatory Visit (INDEPENDENT_AMBULATORY_CARE_PROVIDER_SITE_OTHER): Payer: PRIVATE HEALTH INSURANCE | Admitting: Family Medicine

## 2012-03-08 VITALS — BP 126/77 | Ht 68.0 in | Wt 150.0 lb

## 2012-03-08 DIAGNOSIS — M2580 Other specified joint disorders, unspecified joint: Secondary | ICD-10-CM

## 2012-03-08 DIAGNOSIS — M948X9 Other specified disorders of cartilage, unspecified sites: Secondary | ICD-10-CM

## 2012-03-08 DIAGNOSIS — M216X9 Other acquired deformities of unspecified foot: Secondary | ICD-10-CM

## 2012-03-08 MED ORDER — DICLOFENAC SODIUM 1 % TD GEL: 1.0000 "application " | Freq: Four times a day (QID) | TRANSDERMAL | Status: DC

## 2012-03-08 NOTE — Progress Notes (Signed)
  Subjective:    Patient ID: [name], female    DOB: 1956-03-05, 57 y.o.   MRN: [id number]  HPI   Right foot pain since march/2012, no MOI, the pain is located in the sesamoid area of the right foot, more medial than lateral, she has high arch B/L. She work standing for long period of time which worsen her pain. The pain is sharp, located in the plantar aspect of the first MTPJ, worse with weightbearing activities, improved by rest. No numbness or tingling.  Patient Active Problem List  Diagnosis  . Hot flashes, menopausal  . Anxiety  . Restless leg syndrome  . Metatarsalgia of both feet   Current Outpatient Prescriptions on File Prior to Visit  Medication Sig Dispense Refill  . gabapentin (NEURONTIN) 300 MG capsule Take 1 capsule (300 mg total) by mouth 2 (two) times daily.  180 capsule  3  . venlafaxine XR (EFFEXOR-XR) 37.5 MG 24 hr capsule Take 1 capsule (37.5 mg total) by mouth daily.  180 capsule  3   Allergies  Allergen Reactions  . Codeine Nausea And Vomiting        Review of Systems  Constitutional: Negative for fever, chills, diaphoresis and fatigue.  Musculoskeletal: Positive for gait problem (sesamoid pain). Negative for back pain and arthralgias.  Neurological: Negative for weakness and numbness.       Objective:   Physical Exam  Constitutional: She is oriented to person, place, and time. She appears well-developed and well-nourished.       BP 126/77  Ht 5\' 8"  (1.727 m)  Wt 150 lb (68.04 kg)  BMI 22.81 kg/m2   Pulmonary/Chest: Effort normal.  Musculoskeletal:       Right foot with TTP in the medial sesamoid B/L high arch  Neurological: She is alert and oriented to person, place, and time. She has normal reflexes.  Skin: Skin is warm. No rash noted. No erythema.  Psychiatric: She has a normal mood and affect. Her behavior is normal. Thought content normal.     MSK U/S : Mild DJD in the Right  first MTPJ. Tenderness to palpation over the medial  sesamoid when pressure is applied with the probe.     Assessment & Plan:   1. Sesamoiditis   2. Cavus foot, acquired    We will start her sports insoles, medium metatarsal pads, small scaphoid pads Ice massage  Voltaren gel Aleeve po bid F/U in 4 weeks

## 2012-03-30 ENCOUNTER — Encounter: Payer: Self-pay | Admitting: Family Medicine

## 2012-03-30 ENCOUNTER — Ambulatory Visit (INDEPENDENT_AMBULATORY_CARE_PROVIDER_SITE_OTHER): Payer: 59 | Admitting: Family Medicine

## 2012-03-30 VITALS — BP 111/75 | HR 99 | Temp 98.1°F | Ht 68.0 in | Wt 155.0 lb

## 2012-03-30 DIAGNOSIS — M545 Low back pain: Secondary | ICD-10-CM | POA: Insufficient documentation

## 2012-03-30 DIAGNOSIS — F17210 Nicotine dependence, cigarettes, uncomplicated: Secondary | ICD-10-CM

## 2012-03-30 DIAGNOSIS — M7989 Other specified soft tissue disorders: Secondary | ICD-10-CM | POA: Insufficient documentation

## 2012-03-30 DIAGNOSIS — N951 Menopausal and female climacteric states: Secondary | ICD-10-CM

## 2012-03-30 DIAGNOSIS — F17200 Nicotine dependence, unspecified, uncomplicated: Secondary | ICD-10-CM

## 2012-03-30 DIAGNOSIS — Z23 Encounter for immunization: Secondary | ICD-10-CM

## 2012-03-30 DIAGNOSIS — Z0000 Encounter for general adult medical examination without abnormal findings: Secondary | ICD-10-CM

## 2012-03-30 MED ORDER — TRAMADOL HCL 50 MG PO TABS: 50.0000 mg | ORAL_TABLET | Freq: Two times a day (BID) | ORAL | Status: AC | PRN

## 2012-03-30 NOTE — Assessment & Plan Note (Signed)
Check CMP, dLDL, gave referral form for mammogram and colonoscopy. Gave Tdap today.

## 2012-03-30 NOTE — Patient Instructions (Addendum)
Gladyse,  Thank you for coming in today. Your next pap is due 09/2012 Please call and schedule your mammogram and colonoscopy.   For you hot flashes: quit smoking. It helps to set a quit date.  Smoking cessation support: smoking cessation hotline: 1-800-QUIT-NOW.  Here is the number to the smoking cessation classes at [facility]: 960-4540  Please go to [facility] for your L  hand and  R hip x-ray.   For back pain do the following: 1. Tylenol 1000 mg every 6 hrs as needed 2. Motrin 600 every 6 hours or 800 every 8 hours as needed 3. Tramadol for severe pain: do not take if it makes you nauseated  4. Heating pad.   Dr. Armen Pickup

## 2012-03-30 NOTE — Progress Notes (Signed)
Subjective:     Patient ID: [name], female   DOB: 20-Aug-1956, 56 y.o.   MRN: [id number]  HPI 56 yo F presents with her cousin for physical and to discuss the following:  1. Still having hot flashes: also still smoking. Taking effexor. Stays up until 2 AM some nights sweating.   2. L 3rd digit swelling: x 2 months. Cannot recall and injury. The swelling has been unchanged. Associated with pain when picking up heavy pots or pans or grip. No loss of strength. Some numbness in the finger.   3. Back pain: off and on x some years. Usually comes on without warning. Last from 3 days to 3 weeks. Present x 3 days. Patient has  Tried tylenol and motrin w/o relief. Denies fever, LE weakness, fecal/urinary incontinence and tingling/numbness in back, butt or groin.   4. Health maintenance: due for mammogram. Due for pap in 09/2012. Due for colonoscopy never had one. Due for pneumovax. Due for CMP and LDL check.   Soc Hx: 3/4-1/5 PPD smoker, does not exercise regularly, drink monthly or less, has 6 or more drinks at times but usually 1-2, denies illicit drug use, feels sage in relationships. Was laid off last week. Denies depression, hopelessness and anhedonia.   Review of Systems As per HPI  Otherwise brief ROS negative except for occasional dizziness     Objective:   Physical Exam BP 111/75  Pulse 99  Temp 98.1 F (36.7 C) (Oral)  Ht 5\' 8"  (1.727 m)  Wt 155 lb (70.308 kg)  BMI 23.57 kg/m2 General appearance: alert, cooperative and no distress Lungs: clear to auscultation bilaterally Heart: regular rate and rhythm, S1, S2 normal, no murmur, click, rub or gallop Extremities: extremities normal, atraumatic, no cyanosis or edema L hand: with appreciable swelling over plantar surface of 3rd digit in proximal phalange. ? Cyst. No erythema or streaking. Full strength in finger up flexion.  Back Exam: Inspection: normal  Motion: full  SLR seated:  neg                        SLR lying: neg    XSLR seated: neg                         XSLR lying: neg  Seated HS Flexibility: normal  Palpable tenderness: L4 down to L2 R paraspinal. No spinous process step off or tenderness  FABER: negative on L, equivocal on R  Sensory change: neg  Reflex change: neg  1+ R PT, 2 + L PT   Strength at foot Plantar-flexion: 5 / 5    Dorsi-flexion: 5 / 5    Eversion:  5/ 5   Inversion: 5 / 5 Leg strength Quad:  5/ 5   Hamstring:  5/ 5   Hip flexor:  5/ 5   Hip abductors: 5 / 5 Gait Walking: normal  Assessment and Plan:

## 2012-03-30 NOTE — Assessment & Plan Note (Addendum)
A: ? Ganglion cyst in flexor tendon sheath. Patient is unsure about injury. P:  -pain control with tylenol/motrin/tramadol prn -x-ray.

## 2012-03-30 NOTE — Assessment & Plan Note (Signed)
counseled and offered smoking cessation.

## 2012-03-30 NOTE — Assessment & Plan Note (Signed)
A: MSK pain. No red flags to suggest malignancy of nerve impingement. ? R hip pathology exacerbating low back pain.  P:  Tylenol/motrin/tramadol Heat R hip x-ray

## 2012-03-30 NOTE — Assessment & Plan Note (Signed)
A: persistent. P:  Continue gabapentin and Effexor Smoking cessation.

## 2012-03-31 LAB — COMPREHENSIVE METABOLIC PANEL
ALT: 18 U/L (ref 0–35)
AST: 15 U/L (ref 0–37)
Albumin: 4.6 g/dL (ref 3.5–5.2)
Alkaline Phosphatase: 77 U/L (ref 39–117)
BUN: 20 mg/dL (ref 6–23)
Calcium: 10.1 mg/dL (ref 8.4–10.5)
Chloride: 104 mEq/L (ref 96–112)
Creat: 0.94 mg/dL (ref 0.50–1.10)
Potassium: 4.6 mEq/L (ref 3.5–5.3)

## 2012-04-02 ENCOUNTER — Other Ambulatory Visit: Payer: Self-pay | Admitting: Family Medicine

## 2012-04-02 DIAGNOSIS — Z1231 Encounter for screening mammogram for malignant neoplasm of breast: Secondary | ICD-10-CM

## 2012-04-04 ENCOUNTER — Encounter: Payer: Self-pay | Admitting: Family Medicine

## 2012-04-13 ENCOUNTER — Ambulatory Visit (AMBULATORY_SURGERY_CENTER): Payer: 59 | Admitting: *Deleted

## 2012-04-13 ENCOUNTER — Ambulatory Visit (HOSPITAL_COMMUNITY)
Admission: RE | Admit: 2012-04-13 | Discharge: 2012-04-13 | Disposition: A | Discharge status: Home / Self Care | Payer: 59 | Source: Ambulatory Visit | Attending: Family Medicine | Admitting: Family Medicine

## 2012-04-13 VITALS — Ht 68.0 in | Wt 158.0 lb

## 2012-04-13 DIAGNOSIS — M545 Low back pain, unspecified: Secondary | ICD-10-CM | POA: Insufficient documentation

## 2012-04-13 DIAGNOSIS — Z1211 Encounter for screening for malignant neoplasm of colon: Secondary | ICD-10-CM

## 2012-04-13 DIAGNOSIS — M7989 Other specified soft tissue disorders: Secondary | ICD-10-CM | POA: Insufficient documentation

## 2012-04-13 MED ORDER — MOVIPREP 100 G PO SOLR: ORAL | Status: DC

## 2012-04-16 ENCOUNTER — Encounter: Payer: Self-pay | Admitting: Gastroenterology

## 2012-04-19 ENCOUNTER — Ambulatory Visit (HOSPITAL_COMMUNITY): Payer: 59 | Attending: Family Medicine

## 2012-04-23 ENCOUNTER — Encounter: Payer: Self-pay | Admitting: Family Medicine

## 2012-04-23 ENCOUNTER — Ambulatory Visit (INDEPENDENT_AMBULATORY_CARE_PROVIDER_SITE_OTHER): Payer: 59 | Admitting: Family Medicine

## 2012-04-23 VITALS — BP 111/70 | HR 82 | Temp 97.7°F | Ht 68.0 in | Wt 155.3 lb

## 2012-04-23 DIAGNOSIS — L0291 Cutaneous abscess, unspecified: Secondary | ICD-10-CM

## 2012-04-23 MED ORDER — MINOCYCLINE HCL 100 MG PO CAPS: 100.0000 mg | ORAL_CAPSULE | Freq: Two times a day (BID) | ORAL | Status: AC

## 2012-04-23 NOTE — Progress Notes (Signed)
  Subjective:    Patient ID: HANAKO TIPPING, female    DOB: 1955/10/21, 56 y.o.   MRN: [id number]  HPI  1.  "Spot" on side:  Patient with sore spot on Left side of abdomen.  States that scar has been there for about a year, but it recently became inflamed and painful in past 10 days.  Unclear what caused original scar.  No drainage from site.  Very tender to touch.  No nausea, vomiting, abdominal pain, fevers or chills.  Eating and drinking well.    Review of Systems See HPI above for review of systems.       Objective:   Physical Exam Gen:  Alert, cooperative patient who appears stated age in no acute distress.  Vital signs reviewed. Skin:  1.5 cm abscess noted left flank, approximately 3 cm anterior to mid-axillary line.  Induration in surrounding tissue and some overlying erythema as well.  Tender to palpation       Assessment & Plan:

## 2012-04-23 NOTE — Assessment & Plan Note (Signed)
Informed consent given and signed copy in chart. Appropriate time out taken. Area prepped and draped in usual sterile fashion. Local anesthesia with 2% lidocaine with epi (8 cc). Small 1 cm incision made horizontally across center of mass with immediate expression of purulent material.  Blunt disection used break any loculations.  No sutures needed. Incision was closed with two 3-0 vicryl.  Area was cleaned and topical abx ointment applied. Bandage applied.  No complications. EBL < 5 cc. Given wound care instructions. Doxycyline as apparent cellulitis in surrounding areas as well.

## 2012-04-23 NOTE — Patient Instructions (Signed)
Take the antibiotics for the next 7 days, twice a day. The handout should answer all of your questions -- if you have any issues you can call.

## 2012-04-25 ENCOUNTER — Encounter: Payer: Self-pay | Admitting: Gastroenterology

## 2012-04-25 ENCOUNTER — Ambulatory Visit (AMBULATORY_SURGERY_CENTER): Payer: 59 | Admitting: Gastroenterology

## 2012-04-25 VITALS — BP 143/78 | HR 59 | Temp 96.6°F | Resp 25 | Ht 68.0 in | Wt 158.0 lb

## 2012-04-25 DIAGNOSIS — D126 Benign neoplasm of colon, unspecified: Secondary | ICD-10-CM

## 2012-04-25 DIAGNOSIS — K621 Rectal polyp: Secondary | ICD-10-CM

## 2012-04-25 DIAGNOSIS — K620 Anal polyp: Secondary | ICD-10-CM

## 2012-04-25 DIAGNOSIS — Z1211 Encounter for screening for malignant neoplasm of colon: Secondary | ICD-10-CM

## 2012-04-25 MED ORDER — SODIUM CHLORIDE 0.9 % IV SOLN: 500.0000 mL | INTRAVENOUS | Status: DC

## 2012-04-25 NOTE — Op Note (Signed)
[facility] [address] [place], [place]  [address]  COLONOSCOPY PROCEDURE REPORT  PATIENT:  [name], [name]  MR#:  [id number] BIRTHDATE:  Sep 22, 1955, 56 yrs. old  GENDER:  female ENDOSCOPIST:  Vania Rea. Jarold Motto, MD, [facility] REF. BY: PROCEDURE DATE:  04/25/2012 PROCEDURE:  Colonoscopy with snare polypectomy ASA CLASS:  Class II INDICATIONS:  Routine Risk Screening MEDICATIONS:   propofol (Diprivan) 200 mg IV  DESCRIPTION OF PROCEDURE:   After the risks and benefits and of the procedure were explained, informed consent was obtained. Digital rectal exam was performed and revealed no abnormalities. The LB CF-[id number] endoscope was introduced through the anus and advanced to the cecum, which was identified by both the appendix and ileocecal valve.  The quality of the prep was poor, using MoviPrep.  The instrument was then slowly withdrawn as the colon was fully examined. <<PROCEDUREIMAGES>>  FINDINGS:  There were multiple polyps identified and removed. in the rectum. small 1-3 mm poklyps cold snare excised  This was otherwise a normal examination of the colon.   Retroflexed views in the rectum revealed no abnormalities.    The scope was then withdrawn from the patient and the procedure completed.  COMPLICATIONS:  None ENDOSCOPIC IMPRESSION: 1) Polyps, multiple in the rectum RECOMMENDATIONS: 1) Await biopsy results 2) Repeat colonoscopy in 5 years if polyp adenomatous; otherwise 10 years  REPEAT EXAM:  No  ______________________________ Vania Rea. Jarold Motto, MD, [name]  CC:  Dessa Phi, MD  n. Rosalie DoctorVania Rea. Patterson at 04/25/2012 01:58 PM  Sonda Primes, [id number]

## 2012-04-25 NOTE — Progress Notes (Signed)
Poor prep per Dr. Jarold Motto.  The pt tolerated the colonoscopy very well. Maw

## 2012-04-25 NOTE — Patient Instructions (Addendum)
Discharge instructions given with verbal understanding. Handout on polyps given. Resume previous medications. YOU HAD AN ENDOSCOPIC PROCEDURE TODAY AT THE [facility]: Refer to the procedure report that was given to you for any specific questions about what was found during the examination.  If the procedure report does not answer your questions, please call your gastroenterologist to clarify.  If you requested that your care partner not be given the details of your procedure findings, then the procedure report has been included in a sealed envelope for you to review at your convenience later.  YOU SHOULD EXPECT: Some feelings of bloating in the abdomen. Passage of more gas than usual.  Walking can help get rid of the air that was put into your GI tract during the procedure and reduce the bloating. If you had a lower endoscopy (such as a colonoscopy or flexible sigmoidoscopy) you may notice spotting of blood in your stool or on the toilet paper. If you underwent a bowel prep for your procedure, then you may not have a normal bowel movement for a few days.  DIET: Your first meal following the procedure should be a light meal and then it is ok to progress to your normal diet.  A half-sandwich or bowl of soup is an example of a good first meal.  Heavy or fried foods are harder to digest and may make you feel nauseous or bloated.  Likewise meals heavy in dairy and vegetables can cause extra gas to form and this can also increase the bloating.  Drink plenty of fluids but you should avoid alcoholic beverages for 24 hours.  ACTIVITY: Your care partner should take you home directly after the procedure.  You should plan to take it easy, moving slowly for the rest of the day.  You can resume normal activity the day after the procedure however you should NOT DRIVE or use heavy machinery for 24 hours (because of the sedation medicines used during the test).    SYMPTOMS TO REPORT IMMEDIATELY: A  gastroenterologist can be reached at any hour.  During normal business hours, 8:30 AM to 5:00 PM Monday through Friday, call [phone].  After hours and on weekends, please call the GI answering service at [phone] who will take a message and have the physician on call contact you.   Following lower endoscopy (colonoscopy or flexible sigmoidoscopy):  Excessive amounts of blood in the stool  Significant tenderness or worsening of abdominal pains  Swelling of the abdomen that is new, acute  Fever of 100F or higher  FOLLOW UP: If any biopsies were taken you will be contacted by phone or by letter within the next 1-3 weeks.  Call your gastroenterologist if you have not heard about the biopsies in 3 weeks.  Our staff will call the home number listed on your records the next business day following your procedure to check on you and address any questions or concerns that you may have at that time regarding the information given to you following your procedure. This is a courtesy call and so if there is no answer at the home number and we have not heard from you through the emergency physician on call, we will assume that you have returned to your regular daily activities without incident.  SIGNATURES/CONFIDENTIALITY: You and/or your care partner have signed paperwork which will be entered into your electronic medical record.  These signatures attest to the fact that that the information above on your After Visit Summary has   been reviewed and is understood.  Full responsibility of the confidentiality of this discharge information lies with you and/or your care-partner. 

## 2012-04-25 NOTE — Progress Notes (Signed)
Patient did not experience any of the following events: a burn prior to discharge; a fall within the facility; wrong site/side/patient/procedure/implant event; or a hospital transfer or hospital admission upon discharge from the facility. (G8907) Patient did not have preoperative order for IV antibiotic SSI prophylaxis. (G8918)  

## 2012-04-26 ENCOUNTER — Telehealth: Payer: Self-pay | Admitting: *Deleted

## 2012-04-26 NOTE — Telephone Encounter (Signed)
  Follow up Call-  Call back number 04/25/2012  Post procedure Call Back phone  # [phone]  Permission to leave phone message Yes     Patient questions:  Do you have a fever, pain , or abdominal swelling? no Pain Score  0 *  Have you tolerated food without any problems? yes  Have you been able to return to your normal activities? yes  Do you have any questions about your discharge instructions: Diet   yes Medications  yes Follow up visit  no  Do you have questions or concerns about your Care? no  Actions: * If pain score is 4 or above: No action needed, pain <4.pt did not have any questions as indicated above

## 2012-04-30 ENCOUNTER — Telehealth: Payer: Self-pay | Admitting: Family Medicine

## 2012-04-30 NOTE — Telephone Encounter (Signed)
Patient is calling about the packing in her wound from cyst removal.

## 2012-04-30 NOTE — Telephone Encounter (Signed)
Spoke with patient about questions she has. She had this procedure done 8/12 and just took bandaid off yesterday due to her not wanting this area to leak on her clothes so the packing is still in and has not been removed. Will let Dr. Gwendolyn Grant know about this and what should be done at this point.

## 2012-05-01 ENCOUNTER — Encounter: Payer: Self-pay | Admitting: Gastroenterology

## 2012-05-01 NOTE — Telephone Encounter (Signed)
Attempted to call patient, child screaming in background and it was hard to hear her.

## 2012-05-01 NOTE — Telephone Encounter (Signed)
She should go ahead and remove the packing.  Since it's been in so long, she should come back in for a recheck to make sure that the area's not becoming infected again.

## 2012-05-01 NOTE — Telephone Encounter (Signed)
Called and informed pt of Dr. Tyson Alias suggestion and appt made for 8.21.2013 @ 345 PM..Marland KitchenLoralee Pacas [place]

## 2012-05-02 ENCOUNTER — Encounter: Payer: Self-pay | Admitting: Family Medicine

## 2012-05-02 ENCOUNTER — Ambulatory Visit (INDEPENDENT_AMBULATORY_CARE_PROVIDER_SITE_OTHER): Payer: 59 | Admitting: Family Medicine

## 2012-05-02 VITALS — BP 104/70 | HR 90 | Temp 97.9°F | Ht 69.0 in | Wt 155.0 lb

## 2012-05-02 DIAGNOSIS — L0291 Cutaneous abscess, unspecified: Secondary | ICD-10-CM

## 2012-05-02 NOTE — Progress Notes (Signed)
  Subjective:    Patient ID: [name], female    DOB: 1956-06-24, 56 y.o.   MRN: [id number]  HPI  1.  Wound recheck:  Patient with I&D of abscess on 8/12.  She was instructed to remove packing 24 - 48 hours after procedure, but she was afraid she would have bleeding on her clothing and she left bandage on.  Did not remove until 8/19 and could not find packing, afraid it is still in place.   Since 8/12, she's been doing well.  No drainage, no further pain.  No fevers, chills, redness in area.  No nausea or vomiting.  She's not had to keep it covered for past 2 days.   Review of Systems See HPI above for review of systems.      Objective:   Physical Exam Gen:  Alert, cooperative patient who appears stated age in no acute distress.  Vital signs reviewed. Skin:  Well-healed incision site Left abdominal area.  No redness, fluctuance, areas of induration.  Good, healthy granulation tissue present.  Nontender.  I do not palpate anything within the wound.        Assessment & Plan:

## 2012-05-02 NOTE — Assessment & Plan Note (Signed)
Healing well.   No foreign bodies within wound, do not palpate any packing.   FU prn.   Gave warnings regarded fevers, return of fluctuance, redness, etc that would signal something is still in site or another infection.  Patient expressed understanding.

## 2012-05-25 ENCOUNTER — Ambulatory Visit (HOSPITAL_COMMUNITY): Payer: 59

## 2012-10-03 ENCOUNTER — Ambulatory Visit (INDEPENDENT_AMBULATORY_CARE_PROVIDER_SITE_OTHER): Payer: PRIVATE HEALTH INSURANCE | Admitting: Family Medicine

## 2012-10-03 ENCOUNTER — Encounter: Payer: Self-pay | Admitting: Family Medicine

## 2012-10-03 VITALS — BP 127/84 | HR 98 | Temp 97.9°F | Ht 68.0 in | Wt 165.0 lb

## 2012-10-03 DIAGNOSIS — H00019 Hordeolum externum unspecified eye, unspecified eyelid: Secondary | ICD-10-CM

## 2012-10-03 NOTE — Progress Notes (Signed)
  Subjective:    Patient ID: [name], female    DOB: Sep 24, 1955, 57 y.o.   MRN: [id number]  HPI  Eye Mass Has a lump in Left upper eyelid for last month. Not painful but annoying.  No discharge or redness or visual changes  Had a stye once as a child  Review of Systems     Objective:   Physical Exam  Left upper lid 0.5 cm firm nontender mass in mid lid.  No foreign body seen on retraction.  Does not appear to point on the inner lid  Eye - Pupils Equal Round Reactive to light, Extraocular movements intact, , Conjunctiva without redness or discharge        Assessment & Plan:

## 2012-10-03 NOTE — Assessment & Plan Note (Signed)
Will try warm compresses but if does not resolve will need referral for surgery

## 2012-10-03 NOTE — Patient Instructions (Addendum)
Use warm washcloth for 10 minutes on that eye 4 x a day for at least a week  If it is not better then call and leave a message and we will refer you to eye surgeon

## 2012-10-12 ENCOUNTER — Ambulatory Visit (INDEPENDENT_AMBULATORY_CARE_PROVIDER_SITE_OTHER): Payer: PRIVATE HEALTH INSURANCE | Admitting: *Deleted

## 2012-10-12 DIAGNOSIS — Z111 Encounter for screening for respiratory tuberculosis: Secondary | ICD-10-CM

## 2012-10-15 ENCOUNTER — Ambulatory Visit (INDEPENDENT_AMBULATORY_CARE_PROVIDER_SITE_OTHER): Payer: PRIVATE HEALTH INSURANCE | Admitting: *Deleted

## 2012-10-15 DIAGNOSIS — Z111 Encounter for screening for respiratory tuberculosis: Secondary | ICD-10-CM

## 2012-10-15 LAB — TB SKIN TEST: Induration: 0 mm

## 2012-10-20 ENCOUNTER — Encounter (HOSPITAL_COMMUNITY): Payer: Self-pay | Admitting: Emergency Medicine

## 2012-10-20 ENCOUNTER — Emergency Department (HOSPITAL_COMMUNITY): Payer: PRIVATE HEALTH INSURANCE

## 2012-10-20 ENCOUNTER — Emergency Department (HOSPITAL_COMMUNITY)
Admission: EM | Admit: 2012-10-20 | Discharge: 2012-10-20 | Disposition: A | Discharge status: Home / Self Care | Payer: PRIVATE HEALTH INSURANCE | Attending: Emergency Medicine | Admitting: Emergency Medicine

## 2012-10-20 DIAGNOSIS — Y9389 Activity, other specified: Secondary | ICD-10-CM | POA: Insufficient documentation

## 2012-10-20 DIAGNOSIS — Z791 Long term (current) use of non-steroidal anti-inflammatories (NSAID): Secondary | ICD-10-CM | POA: Insufficient documentation

## 2012-10-20 DIAGNOSIS — X500XXA Overexertion from strenuous movement or load, initial encounter: Secondary | ICD-10-CM | POA: Insufficient documentation

## 2012-10-20 DIAGNOSIS — S52599A Other fractures of lower end of unspecified radius, initial encounter for closed fracture: Secondary | ICD-10-CM | POA: Insufficient documentation

## 2012-10-20 DIAGNOSIS — S52501A Unspecified fracture of the lower end of right radius, initial encounter for closed fracture: Secondary | ICD-10-CM | POA: Diagnosis present

## 2012-10-20 DIAGNOSIS — Z8659 Personal history of other mental and behavioral disorders: Secondary | ICD-10-CM | POA: Insufficient documentation

## 2012-10-20 DIAGNOSIS — Y92410 Unspecified street and highway as the place of occurrence of the external cause: Secondary | ICD-10-CM | POA: Insufficient documentation

## 2012-10-20 DIAGNOSIS — Z79899 Other long term (current) drug therapy: Secondary | ICD-10-CM | POA: Insufficient documentation

## 2012-10-20 DIAGNOSIS — Z8742 Personal history of other diseases of the female genital tract: Secondary | ICD-10-CM | POA: Insufficient documentation

## 2012-10-20 DIAGNOSIS — M129 Arthropathy, unspecified: Secondary | ICD-10-CM | POA: Insufficient documentation

## 2012-10-20 DIAGNOSIS — F17200 Nicotine dependence, unspecified, uncomplicated: Secondary | ICD-10-CM | POA: Insufficient documentation

## 2012-10-20 LAB — CBC WITH DIFFERENTIAL/PLATELET
Eosinophils Absolute: 0.2 10*3/uL (ref 0.0–0.7)
HCT: 37.9 % (ref 36.0–46.0)
Hemoglobin: 12.4 g/dL (ref 12.0–15.0)
Lymphs Abs: 3.7 10*3/uL (ref 0.7–4.0)
MCH: 28.5 pg (ref 26.0–34.0)
MCV: 87.1 fL (ref 78.0–100.0)
Monocytes Absolute: 0.8 10*3/uL (ref 0.1–1.0)
Monocytes Relative: 7 % (ref 3–12)
Neutrophils Relative %: 60 % (ref 43–77)
RBC: 4.35 MIL/uL (ref 3.87–5.11)

## 2012-10-20 LAB — COMPREHENSIVE METABOLIC PANEL
Alkaline Phosphatase: 81 U/L (ref 39–117)
BUN: 13 mg/dL (ref 6–23)
Creatinine, Ser: 0.83 mg/dL (ref 0.50–1.10)
GFR calc Af Amer: 89 mL/min — ABNORMAL LOW (ref >90)
Glucose, Bld: 98 mg/dL (ref 70–99)
Potassium: 3.3 mEq/L — ABNORMAL LOW (ref 3.5–5.1)
Total Bilirubin: 0.3 mg/dL (ref 0.3–1.2)
Total Protein: 7.8 g/dL (ref 6.0–8.3)

## 2012-10-20 MED ORDER — SODIUM CHLORIDE 0.9 % IV SOLN: Freq: Once | INTRAVENOUS | Status: DC

## 2012-10-20 MED ORDER — HYDROMORPHONE HCL PF 1 MG/ML IJ SOLN
1.0000 mg | Freq: Once | INTRAMUSCULAR | Status: AC
  Administered 2012-10-20: 1 mg via INTRAVENOUS
  Filled 2012-10-20: qty 1

## 2012-10-20 MED ORDER — HYDROCODONE-ACETAMINOPHEN 5-325 MG PO TABS: 1.0000 | ORAL_TABLET | ORAL | Status: DC | PRN

## 2012-10-20 MED ORDER — ONDANSETRON 4 MG PO TBDP
4.0000 mg | ORAL_TABLET | Freq: Once | ORAL | Status: AC
  Administered 2012-10-20: 4 mg via ORAL
  Filled 2012-10-20: qty 1

## 2012-10-20 MED ORDER — ONDANSETRON HCL 4 MG/2ML IJ SOLN: 4.0000 mg | Freq: Once | INTRAMUSCULAR | Status: DC

## 2012-10-20 MED ORDER — LIDOCAINE HCL 1 % IJ SOLN
2.0000 mL | Freq: Once | INTRAMUSCULAR | Status: DC
  Filled 2012-10-20: qty 20

## 2012-10-20 MED ORDER — DIAZEPAM 5 MG/ML IJ SOLN
INTRAMUSCULAR | Status: AC
  Administered 2012-10-20: 10 mg
  Filled 2012-10-20: qty 2

## 2012-10-20 MED ORDER — SODIUM CHLORIDE 0.9 % IV BOLUS (SEPSIS)
1000.0000 mL | INTRAVENOUS | Status: AC
  Administered 2012-10-20: 1000 mL via INTRAVENOUS

## 2012-10-20 MED ORDER — HYDROMORPHONE HCL PF 1 MG/ML IJ SOLN
INTRAMUSCULAR | Status: AC
  Administered 2012-10-20: 1 mg
  Filled 2012-10-20: qty 1
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
## 2012-10-22 NOTE — ED Notes (Signed)
Patient called requesting work note.

## 2012-10-24 NOTE — ED Notes (Signed)
Patient called with question about medication not working.Patient was informed to return if she needed more medication for recheck.

## 2012-10-27 ENCOUNTER — Emergency Department (HOSPITAL_COMMUNITY)
Admission: EM | Admit: 2012-10-27 | Discharge: 2012-10-27 | Disposition: A | Discharge status: Home / Self Care | Payer: PRIVATE HEALTH INSURANCE | Attending: Emergency Medicine | Admitting: Emergency Medicine

## 2012-10-27 ENCOUNTER — Encounter (HOSPITAL_COMMUNITY): Payer: Self-pay | Admitting: Emergency Medicine

## 2012-10-27 DIAGNOSIS — F17200 Nicotine dependence, unspecified, uncomplicated: Secondary | ICD-10-CM | POA: Insufficient documentation

## 2012-10-27 DIAGNOSIS — Z8709 Personal history of other diseases of the respiratory system: Secondary | ICD-10-CM | POA: Insufficient documentation

## 2012-10-27 DIAGNOSIS — S62109A Fracture of unspecified carpal bone, unspecified wrist, initial encounter for closed fracture: Secondary | ICD-10-CM | POA: Insufficient documentation

## 2012-10-27 DIAGNOSIS — Z8739 Personal history of other diseases of the musculoskeletal system and connective tissue: Secondary | ICD-10-CM | POA: Insufficient documentation

## 2012-10-27 DIAGNOSIS — Z8742 Personal history of other diseases of the female genital tract: Secondary | ICD-10-CM | POA: Insufficient documentation

## 2012-10-27 DIAGNOSIS — Y9389 Activity, other specified: Secondary | ICD-10-CM | POA: Insufficient documentation

## 2012-10-27 DIAGNOSIS — Z79899 Other long term (current) drug therapy: Secondary | ICD-10-CM | POA: Insufficient documentation

## 2012-10-27 DIAGNOSIS — Y92410 Unspecified street and highway as the place of occurrence of the external cause: Secondary | ICD-10-CM | POA: Insufficient documentation

## 2012-10-27 DIAGNOSIS — Z8659 Personal history of other mental and behavioral disorders: Secondary | ICD-10-CM | POA: Insufficient documentation

## 2012-10-27 MED ORDER — OXYCODONE-ACETAMINOPHEN 5-325 MG PO TABS: 1.0000 | ORAL_TABLET | ORAL | Status: DC | PRN

## 2012-10-27 MED ORDER — HYDROMORPHONE HCL PF 1 MG/ML IJ SOLN
1.0000 mg | Freq: Once | INTRAMUSCULAR | Status: AC
  Administered 2012-10-27: 1 mg via INTRAMUSCULAR
  Filled 2012-10-27: qty 1

## 2012-10-27 MED ORDER — OXYCODONE-ACETAMINOPHEN 5-325 MG PO TABS
1.0000 | ORAL_TABLET | Freq: Once | ORAL | Status: AC
  Administered 2012-10-27: 1 via ORAL
  Filled 2012-10-27: qty 1

## 2012-10-27 NOTE — ED Notes (Signed)
Ortho tec called ?

## 2012-10-27 NOTE — ED Provider Notes (Signed)
Medical screening examination/treatment/procedure(s) were performed by non-physician practitioner and as supervising physician I was immediately available for consultation/collaboration.  Helios Kohlmann T Harshaan Whang, MD 10/27/12 1648 
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
## 2012-11-06 ENCOUNTER — Encounter (HOSPITAL_BASED_OUTPATIENT_CLINIC_OR_DEPARTMENT_OTHER): Payer: Self-pay | Admitting: *Deleted

## 2012-11-06 NOTE — Progress Notes (Signed)
Denies any heart or resp problems  

## 2012-11-08 ENCOUNTER — Ambulatory Visit (HOSPITAL_BASED_OUTPATIENT_CLINIC_OR_DEPARTMENT_OTHER): Payer: PRIVATE HEALTH INSURANCE | Admitting: Anesthesiology

## 2012-11-08 ENCOUNTER — Encounter (HOSPITAL_BASED_OUTPATIENT_CLINIC_OR_DEPARTMENT_OTHER)
Admission: RE | Disposition: A | Discharge status: Home / Self Care | Payer: Self-pay | Source: Ambulatory Visit | Attending: Orthopedic Surgery

## 2012-11-08 ENCOUNTER — Encounter (HOSPITAL_BASED_OUTPATIENT_CLINIC_OR_DEPARTMENT_OTHER): Payer: Self-pay | Admitting: *Deleted

## 2012-11-08 ENCOUNTER — Encounter (HOSPITAL_BASED_OUTPATIENT_CLINIC_OR_DEPARTMENT_OTHER): Payer: Self-pay | Admitting: Anesthesiology

## 2012-11-08 ENCOUNTER — Ambulatory Visit (HOSPITAL_BASED_OUTPATIENT_CLINIC_OR_DEPARTMENT_OTHER)
Admission: RE | Admit: 2012-11-08 | Discharge: 2012-11-09 | Disposition: A | Discharge status: Home / Self Care | Payer: PRIVATE HEALTH INSURANCE | Source: Ambulatory Visit | Attending: Orthopedic Surgery | Admitting: Orthopedic Surgery

## 2012-11-08 DIAGNOSIS — IMO0002 Reserved for concepts with insufficient information to code with codable children: Secondary | ICD-10-CM | POA: Insufficient documentation

## 2012-11-08 HISTORY — PX: OPEN REDUCTION INTERNAL FIXATION (ORIF) DISTAL RADIAL FRACTURE: SHX5989

## 2012-11-08 HISTORY — DX: Major depressive disorder, single episode, unspecified: F32.9

## 2012-11-08 HISTORY — DX: Presence of spectacles and contact lenses: Z97.3

## 2012-11-08 HISTORY — DX: Restless legs syndrome: G25.81

## 2012-11-08 LAB — POCT HEMOGLOBIN-HEMACUE: Hemoglobin: 13.7 g/dL (ref 12.0–15.0)

## 2012-11-08 SURGERY — OPEN REDUCTION INTERNAL FIXATION (ORIF) DISTAL RADIUS FRACTURE
Anesthesia: General | Site: Arm Lower | Laterality: Right | Wound class: Clean

## 2012-11-08 MED ORDER — ONDANSETRON HCL 4 MG/2ML IJ SOLN: 4.0000 mg | Freq: Once | INTRAMUSCULAR | Status: AC | PRN

## 2012-11-08 MED ORDER — CEFAZOLIN SODIUM 1-5 GM-% IV SOLN
1.0000 g | Freq: Three times a day (TID) | INTRAVENOUS | Status: DC
  Administered 2012-11-09: 1 g via INTRAVENOUS

## 2012-11-08 MED ORDER — PROPOFOL 10 MG/ML IV BOLUS
INTRAVENOUS | Status: DC | PRN
  Administered 2012-11-08: 200 mg via INTRAVENOUS

## 2012-11-08 MED ORDER — 0.9 % SODIUM CHLORIDE (POUR BTL) OPTIME
TOPICAL | Status: DC | PRN
  Administered 2012-11-08: 500 mL

## 2012-11-08 MED ORDER — METHOCARBAMOL 500 MG PO TABS: 500.0000 mg | ORAL_TABLET | Freq: Four times a day (QID) | ORAL | Status: DC

## 2012-11-08 MED ORDER — OXYCODONE HCL 5 MG/5ML PO SOLN: 5.0000 mg | Freq: Once | ORAL | Status: AC | PRN

## 2012-11-08 MED ORDER — MIDAZOLAM HCL 5 MG/5ML IJ SOLN
INTRAMUSCULAR | Status: DC | PRN
  Administered 2012-11-08: 2 mg via INTRAVENOUS

## 2012-11-08 MED ORDER — HYDROMORPHONE HCL PF 1 MG/ML IJ SOLN
0.5000 mg | INTRAMUSCULAR | Status: DC | PRN
  Administered 2012-11-09: 1 mg via INTRAVENOUS

## 2012-11-08 MED ORDER — OXYCODONE HCL 5 MG PO TABS: 5.0000 mg | ORAL_TABLET | Freq: Once | ORAL | Status: AC | PRN

## 2012-11-08 MED ORDER — DOCUSATE SODIUM 100 MG PO CAPS
100.0000 mg | ORAL_CAPSULE | Freq: Two times a day (BID) | ORAL | Status: DC
  Administered 2012-11-08: 100 mg via ORAL

## 2012-11-08 MED ORDER — METHOCARBAMOL 100 MG/ML IJ SOLN: 500.0000 mg | Freq: Four times a day (QID) | INTRAVENOUS | Status: DC | PRN

## 2012-11-08 MED ORDER — VITAMIN C 500 MG PO TABS
1000.0000 mg | ORAL_TABLET | Freq: Every day | ORAL | Status: DC
  Administered 2012-11-08: 1000 mg via ORAL

## 2012-11-08 MED ORDER — FENTANYL CITRATE 0.05 MG/ML IJ SOLN
50.0000 ug | INTRAMUSCULAR | Status: DC | PRN
  Administered 2012-11-08: 100 ug via INTRAVENOUS

## 2012-11-08 MED ORDER — ALPRAZOLAM 0.5 MG PO TABS: 0.5000 mg | ORAL_TABLET | Freq: Four times a day (QID) | ORAL | Status: DC | PRN

## 2012-11-08 MED ORDER — DOCUSATE SODIUM 100 MG PO CAPS: 100.0000 mg | ORAL_CAPSULE | Freq: Two times a day (BID) | ORAL | Status: DC

## 2012-11-08 MED ORDER — FENTANYL CITRATE 0.05 MG/ML IJ SOLN
INTRAMUSCULAR | Status: DC | PRN
  Administered 2012-11-08 (×3): 25 ug via INTRAVENOUS

## 2012-11-08 MED ORDER — LACTATED RINGERS IV SOLN
INTRAVENOUS | Status: DC
  Administered 2012-11-08 (×3): via INTRAVENOUS

## 2012-11-08 MED ORDER — EPHEDRINE SULFATE 50 MG/ML IJ SOLN
INTRAMUSCULAR | Status: DC | PRN
  Administered 2012-11-08 (×2): 10 mg via INTRAVENOUS

## 2012-11-08 MED ORDER — OXYCODONE HCL 5 MG PO TABS: 5.0000 mg | ORAL_TABLET | ORAL | Status: DC | PRN

## 2012-11-08 MED ORDER — DEXAMETHASONE SODIUM PHOSPHATE 10 MG/ML IJ SOLN
INTRAMUSCULAR | Status: DC | PRN
  Administered 2012-11-08: 10 mg

## 2012-11-08 MED ORDER — ONDANSETRON HCL 4 MG/2ML IJ SOLN
INTRAMUSCULAR | Status: DC | PRN
  Administered 2012-11-08: 4 mg via INTRAVENOUS

## 2012-11-08 MED ORDER — HYDROCODONE-ACETAMINOPHEN 5-325 MG PO TABS
1.0000 | ORAL_TABLET | ORAL | Status: DC | PRN
  Administered 2012-11-09: 2 via ORAL

## 2012-11-08 MED ORDER — HYDROMORPHONE HCL PF 1 MG/ML IJ SOLN: 0.2500 mg | INTRAMUSCULAR | Status: DC | PRN

## 2012-11-08 MED ORDER — KCL IN DEXTROSE-NACL 20-5-0.45 MEQ/L-%-% IV SOLN: INTRAVENOUS | Status: DC

## 2012-11-08 MED ORDER — VENLAFAXINE HCL ER 37.5 MG PO CP24: 37.5000 mg | ORAL_CAPSULE | Freq: Every day | ORAL | Status: DC

## 2012-11-08 MED ORDER — METHOCARBAMOL 500 MG PO TABS
500.0000 mg | ORAL_TABLET | Freq: Four times a day (QID) | ORAL | Status: DC | PRN
  Administered 2012-11-09: 500 mg via ORAL

## 2012-11-08 MED ORDER — LIDOCAINE HCL (CARDIAC) 20 MG/ML IV SOLN
INTRAVENOUS | Status: DC | PRN
  Administered 2012-11-08: 50 mg via INTRAVENOUS

## 2012-11-08 MED ORDER — DEXAMETHASONE SODIUM PHOSPHATE 4 MG/ML IJ SOLN
INTRAMUSCULAR | Status: DC | PRN
  Administered 2012-11-08: 10 mg via INTRAVENOUS

## 2012-11-08 MED ORDER — ONDANSETRON HCL 4 MG/2ML IJ SOLN: 4.0000 mg | Freq: Four times a day (QID) | INTRAMUSCULAR | Status: DC | PRN

## 2012-11-08 MED ORDER — BUPIVACAINE-EPINEPHRINE PF 0.5-1:200000 % IJ SOLN
INTRAMUSCULAR | Status: DC | PRN
  Administered 2012-11-08: 25 mL

## 2012-11-08 MED ORDER — GABAPENTIN 300 MG PO CAPS
300.0000 mg | ORAL_CAPSULE | Freq: Two times a day (BID) | ORAL | Status: DC
  Administered 2012-11-08: 300 mg via ORAL

## 2012-11-08 MED ORDER — OXYCODONE-ACETAMINOPHEN 10-325 MG PO TABS: 1.0000 | ORAL_TABLET | ORAL | Status: DC | PRN

## 2012-11-08 MED ORDER — CHLORHEXIDINE GLUCONATE 4 % EX LIQD: 60.0000 mL | Freq: Once | CUTANEOUS | Status: DC

## 2012-11-08 MED ORDER — CEFAZOLIN SODIUM-DEXTROSE 2-3 GM-% IV SOLR
2.0000 g | INTRAVENOUS | Status: AC
  Administered 2012-11-08: 2 g via INTRAVENOUS

## 2012-11-08 MED ORDER — CEFAZOLIN SODIUM 1-5 GM-% IV SOLN
1.0000 g | INTRAVENOUS | Status: AC
  Administered 2012-11-08: 1 g via INTRAVENOUS

## 2012-11-08 MED ORDER — ZOLPIDEM TARTRATE 5 MG PO TABS
5.0000 mg | ORAL_TABLET | Freq: Every evening | ORAL | Status: DC | PRN
  Administered 2012-11-08: 5 mg via ORAL

## 2012-11-08 MED ORDER — ONDANSETRON HCL 4 MG PO TABS: 4.0000 mg | ORAL_TABLET | Freq: Four times a day (QID) | ORAL | Status: DC | PRN

## 2012-11-08 MED ORDER — MIDAZOLAM HCL 2 MG/2ML IJ SOLN
1.0000 mg | INTRAMUSCULAR | Status: DC | PRN
  Administered 2012-11-08: 2 mg via INTRAVENOUS

## 2012-11-08 MED ORDER — DIPHENHYDRAMINE HCL 25 MG PO CAPS: 25.0000 mg | ORAL_CAPSULE | Freq: Four times a day (QID) | ORAL | Status: DC | PRN

## 2012-11-08 SURGICAL SUPPLY — 81 items
BANDAGE CONFORM 3  STR LF (GAUZE/BANDAGES/DRESSINGS) IMPLANT
BANDAGE ELASTIC 4 VELCRO ST LF (GAUZE/BANDAGES/DRESSINGS) ×4 IMPLANT
BANDAGE GAUZE ELAST BULKY 4 IN (GAUZE/BANDAGES/DRESSINGS) ×2 IMPLANT
BLADE SURG 15 STRL LF DISP TIS (BLADE) ×2 IMPLANT
BLADE SURG 15 STRL SS (BLADE) ×2
BNDG COHESIVE 4X5 TAN STRL (GAUZE/BANDAGES/DRESSINGS) IMPLANT
BNDG ESMARK 4X9 LF (GAUZE/BANDAGES/DRESSINGS) ×2 IMPLANT
CANISTER SUCTION 1200CC (MISCELLANEOUS) ×2 IMPLANT
CORDS BIPOLAR (ELECTRODE) ×2 IMPLANT
COVER TABLE BACK 60X90 (DRAPES) ×2 IMPLANT
CUFF TOURNIQUET SINGLE 18IN (TOURNIQUET CUFF) ×2 IMPLANT
DECANTER SPIKE VIAL GLASS SM (MISCELLANEOUS) IMPLANT
DRAPE EXTREMITY T 121X128X90 (DRAPE) ×2 IMPLANT
DRAPE OEC MINIVIEW 54X84 (DRAPES) ×2 IMPLANT
DRILL BIT 2.2 MM ×2 IMPLANT
DRSG EMULSION OIL 3X3 NADH (GAUZE/BANDAGES/DRESSINGS) ×2 IMPLANT
DURAPREP 26ML APPLICATOR (WOUND CARE) ×2 IMPLANT
ELECT NEEDLE TIP 2.8 STRL (NEEDLE) IMPLANT
ELECT REM PT RETURN 9FT ADLT (ELECTROSURGICAL)
ELECTRODE REM PT RTRN 9FT ADLT (ELECTROSURGICAL) IMPLANT
GLOVE BIO SURGEON STRL SZ 6.5 (GLOVE) ×2 IMPLANT
GLOVE BIOGEL M STRL SZ7.5 (GLOVE) ×2 IMPLANT
GLOVE BIOGEL PI IND STRL 7.0 (GLOVE) ×1 IMPLANT
GLOVE BIOGEL PI IND STRL 8 (GLOVE) ×1 IMPLANT
GLOVE BIOGEL PI IND STRL 8.5 (GLOVE) ×1 IMPLANT
GLOVE BIOGEL PI INDICATOR 7.0 (GLOVE) ×1
GLOVE BIOGEL PI INDICATOR 8 (GLOVE) ×1
GLOVE BIOGEL PI INDICATOR 8.5 (GLOVE) ×1
GLOVE SURG ORTHO 8.0 STRL STRW (GLOVE) ×2 IMPLANT
GOWN PREVENTION PLUS XXLARGE (GOWN DISPOSABLE) ×4 IMPLANT
K-WIRE 1.6 (WIRE) ×1
K-WIRE FX5X1.6XNS BN SS (WIRE) ×1
KWIRE FX5X1.6XNS BN SS (WIRE) ×1 IMPLANT
NEEDLE 1/2 CIR CATGUT .05X1.09 (NEEDLE) IMPLANT
NEEDLE HYPO 25X1 1.5 SAFETY (NEEDLE) IMPLANT
NS IRRIG 1000ML POUR BTL (IV SOLUTION) ×2 IMPLANT
PACK BASIN DAY SURGERY FS (CUSTOM PROCEDURE TRAY) ×2 IMPLANT
PAD CAST 4YDX4 CTTN HI CHSV (CAST SUPPLIES) ×2 IMPLANT
PADDING CAST ABS 4INX4YD NS (CAST SUPPLIES) ×1
PADDING CAST ABS COTTON 4X4 ST (CAST SUPPLIES) ×1 IMPLANT
PADDING CAST COTTON 4X4 STRL (CAST SUPPLIES) ×2
PEG LOCKING SMOOTH 2.2X16 (Screw) ×2 IMPLANT
PEG LOCKING SMOOTH 2.2X18 (Screw) ×4 IMPLANT
PEG LOCKING SMOOTH 2.2X20 (Screw) ×6 IMPLANT
PEG LOCKING SMOOTH 2.2X22 (Screw) ×4 IMPLANT
PENCIL BUTTON HOLSTER BLD 10FT (ELECTRODE) IMPLANT
PLATE DVR CROSSLOCK STD RT (Plate) ×2 IMPLANT
PUTTY DBM STAGRAFT 2CC (Putty) ×2 IMPLANT
SCREW LOCK 14X2.7X 3 LD TPR (Screw) ×3 IMPLANT
SCREW LOCK 16X2.7X 3 LD TPR (Screw) ×2 IMPLANT
SCREW LOCK 18X2.7X 3 LD TPR (Screw) ×1 IMPLANT
SCREW LOCKING 2.7X14 (Screw) ×3 IMPLANT
SCREW LOCKING 2.7X16 (Screw) ×2 IMPLANT
SCREW LOCKING 2.7X18 (Screw) ×1 IMPLANT
SCREW NLOCK 24X2.7 3 LD (Screw) ×1 IMPLANT
SCREW NONLOCK 2.7X24 (Screw) ×1 IMPLANT
SLEEVE SCD COMPRESS KNEE MED (MISCELLANEOUS) ×2 IMPLANT
SLING ARM FOAM STRAP LRG (SOFTGOODS) IMPLANT
SLING ARM FOAM STRAP MED (SOFTGOODS) IMPLANT
SPLINT FIBERGLASS 3X35 (CAST SUPPLIES) ×2 IMPLANT
SPLINT FIBERGLASS 4X30 (CAST SUPPLIES) IMPLANT
SPONGE GAUZE 4X4 12PLY (GAUZE/BANDAGES/DRESSINGS) ×2 IMPLANT
STOCKINETTE 4X48 STRL (DRAPES) ×2 IMPLANT
SUCTION FRAZIER TIP 10 FR DISP (SUCTIONS) ×6 IMPLANT
SUT MNCRL AB 3-0 PS2 18 (SUTURE) IMPLANT
SUT MON AB 3-0 SH 27 (SUTURE)
SUT MON AB 3-0 SH27 (SUTURE) IMPLANT
SUT PROLENE 3 0 PS 1 (SUTURE) IMPLANT
SUT PROLENE 4 0 PS 2 18 (SUTURE) ×6 IMPLANT
SUT VIC AB 0 CT1 27 (SUTURE)
SUT VIC AB 0 CT1 27XBRD ANBCTR (SUTURE) IMPLANT
SUT VIC AB 2-0 PS2 27 (SUTURE) IMPLANT
SUT VIC AB 2-0 SH 27 (SUTURE)
SUT VIC AB 2-0 SH 27XBRD (SUTURE) IMPLANT
SUT VICRYL 4-0 PS2 18IN ABS (SUTURE) ×4 IMPLANT
SYR BULB 3OZ (MISCELLANEOUS) ×2 IMPLANT
SYR CONTROL 10ML LL (SYRINGE) IMPLANT
TOWEL OR 17X24 6PK STRL BLUE (TOWEL DISPOSABLE) ×2 IMPLANT
TUBE CONNECTING 20X1/4 (TUBING) ×2 IMPLANT
UNDERPAD 30X30 INCONTINENT (UNDERPADS AND DIAPERS) ×2 IMPLANT
WATER STERILE IRR 1000ML POUR (IV SOLUTION) IMPLANT

## 2012-11-08 NOTE — H&P (Signed)
[name] is an 57 y.o. female.   Chief Complaint: right distal radius fracture after fall HPI: Pt fell and injured right wrist, had reduced by ed staff Presented to office with persistent pain and deformity to wrist Pt here for surgery Pt voiced understanding of plan.  Past Medical History  Diagnosis Date  . Anxiety 2009  . Arthritis 2000  . Fibroid 2003  . Hot flashes, menopausal 2012  . Allergy     seasonal  . RLS (restless legs syndrome)   . Wears glasses   . Depression     Past Surgical History  Procedure Laterality Date  . Neck surgery  2003    Cervical vertebroplasty   . Colonoscopy      Family History  Problem Relation Age of Onset  . Hypertension Mother   . Hyperlipidemia Mother   . Early death Sister 25    Drug overdose  . Dementia Father   . Cancer Father     Throat cancer   . Hypertension Daughter    Social History:  reports that she has been smoking Cigarettes.  She has a 32 pack-year smoking history. She has never used smokeless tobacco. She reports that  drinks alcohol. She reports that she does not use illicit drugs.  Allergies:  Allergies  Allergen Reactions  . Codeine Nausea And Vomiting and Other (See Comments)    Hallucinations    Medications Prior to Admission  Medication Sig Dispense Refill  . diphenhydrAMINE (SOMINEX) 25 MG tablet Take 25 mg by mouth at bedtime as needed for sleep.      Marland Kitchen gabapentin (NEURONTIN) 300 MG capsule Take 1 capsule (300 mg total) by mouth 2 (two) times daily.  180 capsule  3  . HYDROcodone-acetaminophen (NORCO) 5-325 MG per tablet Take 1 tablet by mouth every 4 (four) hours as needed for pain.  50 tablet  0  . ibuprofen (ADVIL,MOTRIN) 200 MG tablet Take 200 mg by mouth every 6 (six) hours as needed for pain.      Marland Kitchen diclofenac sodium (VOLTAREN) 1 % GEL Apply 1 application topically 4 (four) times daily.  1 Tube  3  . oxyCODONE-acetaminophen (PERCOCET) 5-325 MG per tablet Take 1-2 tablets by mouth every 4 (four)  hours as needed for pain.  30 tablet  0  . venlafaxine XR (EFFEXOR-XR) 37.5 MG 24 hr capsule Take 1 capsule (37.5 mg total) by mouth daily.  180 capsule  3    Results for orders placed during the hospital encounter of 11/08/12 (from the past 48 hour(s))  POCT HEMOGLOBIN-HEMACUE     Status: None   Collection Time    11/08/12  1:04 PM      Result Value Range   Hemoglobin 13.7  12.0 - 15.0 g/dL   No results found.  NO RECENT ILLNESSES OR HOSPITALIZATIONS  Blood pressure 144/89, pulse 73, temperature 97.5 F (36.4 C), temperature source Oral, resp. rate 18, height 5\' 8"  (1.727 m), weight 76.204 kg (168 lb), SpO2 99.00%. General Appearance:  Alert, cooperative, no distress, appears stated age  Head:  Normocephalic, without obvious abnormality, atraumatic  Eyes:  Pupils equal, conjunctiva/corneas clear,         Throat: Lips, mucosa, and tongue normal; teeth and gums normal  Neck: No visible masses     Lungs:   respirations unlabored  Chest Wall:  No tenderness or deformity  Heart:  Regular rate and rhythm,  Abdomen:   Soft, non-tender,  Extremities: RIGHT HAND: BLOCK IN PLACE SKIN INTACT MODERATE SWELLING OVER DISTAL RADIUS FINGERS WARM WELL PERFUSED  Pulses: 2+ and symmetric  Skin: Skin color, texture, turgor normal, no rashes or lesions     Neurologic: Normal    Assessment/Plan RIGHT DISTAL RADIUS FRACTURE  RIGHT DISTAL RADIUS OPEN REDUCTION AND INTERNAL FIXATION  R/B/A DISCUSSED WITH PT IN OFFICE.  PT VOICED UNDERSTANDING OF PLAN CONSENT SIGNED DAY OF SURGERY PT SEEN AND EXAMINED PRIOR TO OPERATIVE PROCEDURE/DAY OF SURGERY SITE MARKED. QUESTIONS ANSWERED WILL REMAIN AN INPATIENT, OBSERVATION FOLLOWING SURGERY  Sharma Covert 11/08/2012, 2:49 PM

## 2012-11-08 NOTE — Anesthesia Preprocedure Evaluation (Signed)

## 2012-11-08 NOTE — Brief Op Note (Signed)
11/08/2012  2:51 PM  PATIENT:  [name]  57 y.o. female  PRE-OPERATIVE DIAGNOSIS:  right distal radius fracture   POST-OPERATIVE DIAGNOSIS:  SAME  PROCEDURE:  Procedure(s): OPEN REDUCTION INTERNAL FIXATION (ORIF) RIGHT DISTAL RADIUS FRACTURE (Right) MALUNION  SURGEON:  Surgeon(s) and Role:    * Sharma Covert, MD - Primary  PHYSICIAN ASSISTANT:NONE   ASSISTANTS: none   ANESTHESIA:   general  EBL:     BLOOD ADMINISTERED:none  DRAINS: none   LOCAL MEDICATIONS USED:  MARCAINE     SPECIMEN:  No Specimen  DISPOSITION OF SPECIMEN:  N/A  COUNTS:  YES  TOURNIQUET:    DICTATION: .161096  PLAN OF CARE: Admit for overnight observation  PATIENT DISPOSITION:  PACU - hemodynamically stable.   Delay start of Pharmacological VTE agent (>24hrs) due to surgical blood loss or risk of bleeding: not applicable

## 2012-11-08 NOTE — Transfer of Care (Signed)
Immediate Anesthesia Transfer of Care Note  Patient: [name]  Procedure(s) Performed: Procedure(s): OPEN REDUCTION INTERNAL FIXATION (ORIF) RIGHT DISTAL RADIUS FRACTURE (Right)  Patient Location: PACU  Anesthesia Type:GA combined with regional for post-op pain  Level of Consciousness: sedated  Airway & Oxygen Therapy: Patient Spontanous Breathing and Patient connected to face mask oxygen  Post-op Assessment: Report given to PACU RN and Post -op Vital signs reviewed and stable  Post vital signs: Reviewed and stable  Complications: No apparent anesthesia complications

## 2012-11-08 NOTE — Progress Notes (Signed)
Assisted Dr. Crews with right, ultrasound guided, supraclavicular block. Side rails up, monitors on throughout procedure. See vital signs in flow sheet. Tolerated Procedure well. 

## 2012-11-08 NOTE — Anesthesia Procedure Notes (Addendum)
Anesthesia Regional Block:  Supraclavicular block  Pre-Anesthetic Checklist: ,, timeout performed, Correct Patient, Correct Site, Correct Laterality, Correct Procedure, Correct Position, site marked, Risks and benefits discussed,  Surgical consent,  Pre-op evaluation,  At surgeon's request and post-op pain management  Laterality: Right and Upper  Prep: chloraprep       Needles:  Injection technique: Single-shot  Needle Type: Echogenic Needle     Needle Length: 5cm 5 cm Needle Gauge: 21    Additional Needles:  Procedures: ultrasound guided (picture in chart) Supraclavicular block Narrative:  Start time: 11/08/2012 2:16 PM End time: 11/08/2012 2:25 PM Injection made incrementally with aspirations every 5 mL.  Performed by: Personally  Anesthesiologist: Sheldon Silvan  Supraclavicular block Procedure Name: LMA Insertion Date/Time: 11/08/2012 3:01 PM Performed by: Caren Macadam Pre-anesthesia Checklist: Patient identified, Emergency Drugs available, Suction available and Patient being monitored Patient Re-evaluated:Patient Re-evaluated prior to inductionOxygen Delivery Method: Circle System Utilized Preoxygenation: Pre-oxygenation with 100% oxygen Intubation Type: IV induction Ventilation: Mask ventilation without difficulty LMA: LMA inserted LMA Size: 4.0 Number of attempts: 1 Airway Equipment and Method: bite block Placement Confirmation: positive ETCO2 and breath sounds checked- equal and bilateral Tube secured with: Tape Dental Injury: Teeth and Oropharynx as per pre-operative assessment

## 2012-11-08 NOTE — Anesthesia Postprocedure Evaluation (Signed)
Anesthesia Post Note  Patient: [name]  Procedure(s) Performed: Procedure(s) (LRB): OPEN REDUCTION INTERNAL FIXATION (ORIF) RIGHT DISTAL RADIUS FRACTURE (Right)  Anesthesia type: general  Patient location: PACU  Post pain: Pain level controlled  Post assessment: Patient's Cardiovascular Status Stable  Last Vitals:  Filed Vitals:   11/08/12 1830  BP: 129/72  Pulse: 106  Temp:   Resp: 17    Post vital signs: Reviewed and stable  Level of consciousness: sedated  Complications: No apparent anesthesia complications

## 2012-11-12 ENCOUNTER — Encounter (HOSPITAL_BASED_OUTPATIENT_CLINIC_OR_DEPARTMENT_OTHER): Payer: Self-pay | Admitting: Orthopedic Surgery

## 2012-11-12 NOTE — Op Note (Signed)
NAMEJAMIELEE, [name]                ACCOUNT NO.:  [id number]  MEDICAL RECORD NO.:  [id number]  LOCATION:                               FACILITY:  [facility]  PHYSICIAN:  Madelynn Done, MD  DATE OF BIRTH:  1956/03/23  DATE OF PROCEDURE:  11/08/2012 DATE OF DISCHARGE:  11/08/2012                              OPERATIVE REPORT   PREOPERATIVE DIAGNOSIS:  Right distal radius nascent  malunion.  POSTOPERATIVE DIAGNOSIS:  Right distal radius nascent malunion.  PROCEDURE: 1. Open repair right distal radius nascent malunion, internal     fixation. 2. Brachioradialis tendon release and tendon tenotomy. 3. Radiographs, 3 views, right wrist.  ATTENDING PHYSICIAN:  Madelynn Done, MD, who scrubbed and present for the entire procedure.  ASSISTANT SURGEON:  None.  ANESTHESIA:  Supraclavicular block and general anesthesia via LMA.  TOURNIQUET TIME:  2 hours at 250 mmHg.  SURGICAL INTRAOPERATIVE FINDINGS:  The patient did have a grossly malunited distal radius, and it was a  healing fracture which is a gross malunion with a mark and incongruity of the distal radioulnar joint and a very difficult to get back out to length.  Without release of a lot of soft tissue structures to include the pronator quadratus and the brachioradialis.  SURGICAL INDICATIONS:  Ms. Pat is a 57 year old female who sustained a distal radius fracture, which was reduced in the ER, followed up in the office, weeks following the injury with nascent malunion.  It was recommended that she undergo the above procedure.  Risks, benefits, and alternatives were discussed in detail with the patient and signed informed consent was obtained.  Risks include, but not limited to bleeding, infection, damage to nearby nerves, arteries, or tendons, loss of motion of wrist and digits, nonunion malunion, hardware failure, loss of forearm rotation and need for further surgical intervention.  DESCRIPTION OF PROCEDURE:  The patient  was properly identified in the preop holding area and mark with a permanent marker made on the right wrist to indicate the correct operative site.  The patient was then brought back to the operating room, placed supine on anesthesia room table, where general anesthesia was administered.  The patient tolerated this well.  A well-padded tourniquet was then placed on the right brachium and sealed with 1000 drape.  Right upper extremity was then prepped and draped in normal sterile fashion.  Time-out was called, the correct side was identified, and procedure was then begun.  Attention then turned to the right upper extremity.  A longitudinal incision made directly over the FCR.  The FCR sheath was then opened both proximally and distally.  Retracted radially, going between the interval of the FCR and the radial artery.  The patient did have a large malunion with a distal radioulnar shaft sticking into the FCR sheath with the proximal radial shaft sticking into the FCR sheath.  A careful dissection was carried all the way circumferentially around the shaft in order to release the soft tissues along the shaft.  Following this, the distal segment was then released.  This was carefully released protecting the first dorsal compartment tendon.  The brachia radialis tendon tenotomy was then carried  out.  Approximately 15-20 minutes was spent in placing the wrist in traction and trying to reduce the malunion.  It was very difficult to reduce the malunion to get it out to length.  Considering how markedly with the bayonet apposition that was repaired.  After carefully releasing the soft tissues, the wrist was able to be brought out to length.  The patient did have the defect volarly, several milliliters of the Biomet bone graft substitute was then placed into the defect.  Bringing the wrist out to length, it was held temporarily in place with a K-wire.  After K-wire fixation, the volar Hand  innovations, new DVR plate was then applied along the volar surface.  Oblong screw hole was then placed proximally, plate height was adjusted and distal row fixation was then carried out.  The first attempt of the distal row fixation was noted after fixation that there was still difficulty with the forearm rotation.  It appears that  the ulnar column was not out to length.  The screws were then removed distally and then the wrist was brought back out to length, held in place with K-wires and then bringing the wrist into ulnar neutral position.  This has better rotational congruity.  Following this, the plate was then reapplied distal row fixation was then carried out, as well as proximal row fixation of a combination of locking and nonlocking pegs and screws.  The fixation was then carried out proximally with 3 nonlocking and one locking screw. The wound was then irrigated.  Final radiographs were then obtained showing the reduction and the radius back out to length.  There was still some mild in congruent to the distal radioulnar joint.  Stress examination of the area today was then carried out.  The patient did have better pronation and supination of the forearm with good stable wrist both flexion and extension.  Wound was then thoroughly irrigated. The tourniquet was then deflated.  The subcutaneous tissues closed with 4-0 Vicryl and the skin closed with simple 4-0 Prolene.  Adaptic dressing and sterile compressive bandage was then applied.  The patient tolerated the procedure well and returned to recovery room in good condition.  The patient was placed in a sugar-tong splint, extubated, and taken to recovery room in good condition.  POSTPROCEDURE PLAN:  The patient discharged to home, admitted overnight for IV antibiotics and pain control, discharge in the morning, see me back in the office in 2 weeks for x-rays out of the splint, short-arm cast for a total of 2 weeks, and then  begin a therapy regimen around the 4-week mark.  Radiographs at each visit.  PROGNOSIS:  Guarded prognosis.  The patient did have a very significantly malunited distal radius, very difficult getting her back out to length.  I think we got her back out to length.  But I still think, she will have some limitations with her forearm rotation and wrist mobility but I think functionally as long as we get her hand moving and her wrist moving, I wound anticipate her having a functional wrist.  Radiographs, 3 views of the wrist do show the volar plate fixation in place with good alignment in all planes.     Madelynn Done, MD     FWO/MEDQ  D:  11/08/2012  T:  11/09/2012  Job:  [id number]

## 2013-06-12 ENCOUNTER — Other Ambulatory Visit: Payer: Self-pay | Admitting: Orthopedic Surgery

## 2013-06-12 DIAGNOSIS — M25531 Pain in right wrist: Secondary | ICD-10-CM

## 2013-06-19 ENCOUNTER — Other Ambulatory Visit: Payer: PRIVATE HEALTH INSURANCE

## 2013-06-19 ENCOUNTER — Ambulatory Visit (HOSPITAL_COMMUNITY): Payer: PRIVATE HEALTH INSURANCE

## 2013-06-19 ENCOUNTER — Ambulatory Visit (HOSPITAL_COMMUNITY)
Admission: RE | Admit: 2013-06-19 | Discharge: 2013-06-19 | Disposition: A | Discharge status: Home / Self Care | Payer: PRIVATE HEALTH INSURANCE | Source: Ambulatory Visit | Attending: Orthopedic Surgery | Admitting: Orthopedic Surgery

## 2013-06-19 DIAGNOSIS — S52599A Other fractures of lower end of unspecified radius, initial encounter for closed fracture: Secondary | ICD-10-CM | POA: Insufficient documentation

## 2013-06-19 DIAGNOSIS — M25539 Pain in unspecified wrist: Secondary | ICD-10-CM | POA: Insufficient documentation

## 2013-06-19 DIAGNOSIS — M25531 Pain in right wrist: Secondary | ICD-10-CM

## 2013-06-19 DIAGNOSIS — X58XXXA Exposure to other specified factors, initial encounter: Secondary | ICD-10-CM | POA: Insufficient documentation

## 2013-06-26 ENCOUNTER — Telehealth: Payer: Self-pay | Admitting: Family Medicine

## 2013-06-26 NOTE — Telephone Encounter (Signed)
I am confused. It seems that this patient has been out of med since June of this year, since on her med list it has been scratched through. The last clinical documentation I have seen is from dr. Armen Pickup 02/2012 to restart gabapentin. Could we please verify that this patient has actually taken her gabapentin every day since first prescribed. [facility], MD

## 2013-06-26 NOTE — Telephone Encounter (Signed)
Patient made an OV to meet new MD but will be out of her meds for RLS before then. Will need a refill to get her through until 11/7 when she has her appt.

## 2013-06-26 NOTE — Telephone Encounter (Signed)
Will forward to MD. Zhara Gieske,CMA  

## 2013-06-27 ENCOUNTER — Telehealth: Payer: Self-pay | Admitting: Family Medicine

## 2013-06-27 NOTE — Telephone Encounter (Signed)
Will forward to MD. Jazmin Hartsell,CMA  

## 2013-06-27 NOTE — Telephone Encounter (Signed)
Pt stated that she was taking her Gabapentin as needed for pain.  Had other medication she was taking as well in between.

## 2013-07-19 ENCOUNTER — Ambulatory Visit (INDEPENDENT_AMBULATORY_CARE_PROVIDER_SITE_OTHER): Payer: PRIVATE HEALTH INSURANCE | Admitting: Family Medicine

## 2013-07-19 ENCOUNTER — Encounter: Payer: Self-pay | Admitting: Family Medicine

## 2013-07-19 VITALS — BP 136/74 | HR 90 | Temp 97.7°F | Wt 168.0 lb

## 2013-07-19 DIAGNOSIS — Z23 Encounter for immunization: Secondary | ICD-10-CM

## 2013-07-19 DIAGNOSIS — G2581 Restless legs syndrome: Secondary | ICD-10-CM

## 2013-07-19 DIAGNOSIS — K5900 Constipation, unspecified: Secondary | ICD-10-CM

## 2013-07-19 DIAGNOSIS — F32A Depression, unspecified: Secondary | ICD-10-CM

## 2013-07-19 DIAGNOSIS — F329 Major depressive disorder, single episode, unspecified: Secondary | ICD-10-CM

## 2013-07-19 HISTORY — DX: Depression, unspecified: F32.A

## 2013-07-19 LAB — BASIC METABOLIC PANEL
BUN: 20 mg/dL (ref 6–23)
CO2: 27 mEq/L (ref 19–32)
Glucose, Bld: 82 mg/dL (ref 70–99)
Potassium: 4.4 mEq/L (ref 3.5–5.3)

## 2013-07-19 LAB — CBC WITH DIFFERENTIAL/PLATELET
Basophils Relative: 1 % (ref 0–1)
Eosinophils Absolute: 0.2 10*3/uL (ref 0.0–0.7)
HCT: 40.9 % (ref 36.0–46.0)
Hemoglobin: 13.6 g/dL (ref 12.0–15.0)
MCH: 27.7 pg (ref 26.0–34.0)
MCHC: 33.3 g/dL (ref 30.0–36.0)
Monocytes Absolute: 0.6 10*3/uL (ref 0.1–1.0)
Monocytes Relative: 6 % (ref 3–12)
Neutro Abs: 6.3 10*3/uL (ref 1.7–7.7)

## 2013-07-19 LAB — PROTIME-INR: Prothrombin Time: 13.3 seconds (ref 11.6–15.2)

## 2013-07-19 MED ORDER — GABAPENTIN 300 MG PO CAPS: 300.0000 mg | ORAL_CAPSULE | Freq: Two times a day (BID) | ORAL | Status: DC

## 2013-07-19 MED ORDER — DOCUSATE SODIUM 100 MG PO CAPS: 100.0000 mg | ORAL_CAPSULE | Freq: Two times a day (BID) | ORAL | Status: DC

## 2013-07-19 NOTE — Patient Instructions (Addendum)
[name], Thanks for coming in to see me today.  I am sorry that you are having so much difficulty sleeping at night. I believe this may be due to your restless legs syndrome. Lets start the gabapentin again and we can titrate up as needed.  Also I would like you to use your bed for sleeping and sexual activities only. This means no tv or computers in your bed/bedroom. If you are going to watch tv ideally you would turn the tv off for 1-2 hrs prior to bed time. If this doesn't help, please let me know! I hope your surgery goes well.   Charlane Ferretti, MD
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
## 2013-07-19 NOTE — Assessment & Plan Note (Addendum)
A: worsening off meds, expect insomnia is related to the above P: restart gabapentin at 300 with up titration to BID in next 3 days, reassess at next visit

## 2013-07-20 NOTE — Telephone Encounter (Signed)
Informed patient that results were normal on pre-op blood work. Hope that fatigue and depression will improve with appropriate treatment of restless leg sx and surgery for hand next week [facility], MD

## 2013-07-22 ENCOUNTER — Encounter (HOSPITAL_BASED_OUTPATIENT_CLINIC_OR_DEPARTMENT_OTHER): Payer: Self-pay | Admitting: *Deleted

## 2013-07-22 NOTE — Progress Notes (Signed)
No labs needed-bring all nmeds and overnight bag

## 2013-07-24 ENCOUNTER — Encounter (HOSPITAL_BASED_OUTPATIENT_CLINIC_OR_DEPARTMENT_OTHER): Payer: Self-pay | Admitting: Anesthesiology

## 2013-07-24 ENCOUNTER — Ambulatory Visit (HOSPITAL_BASED_OUTPATIENT_CLINIC_OR_DEPARTMENT_OTHER): Payer: PRIVATE HEALTH INSURANCE | Admitting: Anesthesiology

## 2013-07-24 ENCOUNTER — Ambulatory Visit (HOSPITAL_BASED_OUTPATIENT_CLINIC_OR_DEPARTMENT_OTHER)
Admission: RE | Admit: 2013-07-24 | Discharge: 2013-07-24 | Disposition: A | Discharge status: Home / Self Care | Payer: PRIVATE HEALTH INSURANCE | Source: Ambulatory Visit | Attending: Orthopedic Surgery | Admitting: Orthopedic Surgery

## 2013-07-24 ENCOUNTER — Encounter (HOSPITAL_BASED_OUTPATIENT_CLINIC_OR_DEPARTMENT_OTHER): Payer: PRIVATE HEALTH INSURANCE | Admitting: Anesthesiology

## 2013-07-24 ENCOUNTER — Encounter (HOSPITAL_BASED_OUTPATIENT_CLINIC_OR_DEPARTMENT_OTHER)
Admission: RE | Disposition: A | Discharge status: Home / Self Care | Payer: Self-pay | Source: Ambulatory Visit | Attending: Orthopedic Surgery

## 2013-07-24 DIAGNOSIS — M24549 Contracture, unspecified hand: Secondary | ICD-10-CM | POA: Insufficient documentation

## 2013-07-24 DIAGNOSIS — Z885 Allergy status to narcotic agent status: Secondary | ICD-10-CM | POA: Diagnosis not present

## 2013-07-24 DIAGNOSIS — Y831 Surgical operation with implant of artificial internal device as the cause of abnormal reaction of the patient, or of later complication, without mention of misadventure at the time of the procedure: Secondary | ICD-10-CM | POA: Diagnosis not present

## 2013-07-24 DIAGNOSIS — F329 Major depressive disorder, single episode, unspecified: Secondary | ICD-10-CM | POA: Diagnosis not present

## 2013-07-24 DIAGNOSIS — S52501D Unspecified fracture of the lower end of right radius, subsequent encounter for closed fracture with routine healing: Secondary | ICD-10-CM

## 2013-07-24 DIAGNOSIS — X58XXXS Exposure to other specified factors, sequela: Secondary | ICD-10-CM | POA: Diagnosis not present

## 2013-07-24 DIAGNOSIS — F411 Generalized anxiety disorder: Secondary | ICD-10-CM | POA: Diagnosis not present

## 2013-07-24 DIAGNOSIS — T8489XA Other specified complication of internal orthopedic prosthetic devices, implants and grafts, initial encounter: Secondary | ICD-10-CM | POA: Insufficient documentation

## 2013-07-24 DIAGNOSIS — F17200 Nicotine dependence, unspecified, uncomplicated: Secondary | ICD-10-CM | POA: Insufficient documentation

## 2013-07-24 DIAGNOSIS — F3289 Other specified depressive episodes: Secondary | ICD-10-CM | POA: Insufficient documentation

## 2013-07-24 DIAGNOSIS — S42309S Unspecified fracture of shaft of humerus, unspecified arm, sequela: Secondary | ICD-10-CM | POA: Insufficient documentation

## 2013-07-24 HISTORY — PX: HARDWARE REMOVAL: SHX979

## 2013-07-24 SURGERY — REMOVAL, HARDWARE
Anesthesia: Regional | Site: Wrist | Laterality: Right | Wound class: Clean

## 2013-07-24 MED ORDER — LACTATED RINGERS IV SOLN
INTRAVENOUS | Status: DC
  Administered 2013-07-24 (×2): via INTRAVENOUS

## 2013-07-24 MED ORDER — MIDAZOLAM HCL 2 MG/2ML IJ SOLN
1.0000 mg | INTRAMUSCULAR | Status: DC | PRN
  Administered 2013-07-24: 2 mg via INTRAVENOUS

## 2013-07-24 MED ORDER — DEXAMETHASONE SODIUM PHOSPHATE 10 MG/ML IJ SOLN
INTRAMUSCULAR | Status: DC | PRN
  Administered 2013-07-24: 10 mg via INTRAVENOUS

## 2013-07-24 MED ORDER — DOCUSATE SODIUM 100 MG PO CAPS: 100.0000 mg | ORAL_CAPSULE | Freq: Two times a day (BID) | ORAL | Status: DC

## 2013-07-24 MED ORDER — FENTANYL CITRATE 0.05 MG/ML IJ SOLN
INTRAMUSCULAR | Status: AC
  Filled 2013-07-24: qty 2

## 2013-07-24 MED ORDER — MIDAZOLAM HCL 2 MG/2ML IJ SOLN
INTRAMUSCULAR | Status: AC
  Filled 2013-07-24: qty 2

## 2013-07-24 MED ORDER — ONDANSETRON HCL 4 MG/2ML IJ SOLN
INTRAMUSCULAR | Status: DC | PRN
  Administered 2013-07-24: 4 mg via INTRAVENOUS

## 2013-07-24 MED ORDER — FENTANYL CITRATE 0.05 MG/ML IJ SOLN
50.0000 ug | INTRAMUSCULAR | Status: DC | PRN
  Administered 2013-07-24: 100 ug via INTRAVENOUS

## 2013-07-24 MED ORDER — PROPOFOL 10 MG/ML IV BOLUS
INTRAVENOUS | Status: DC | PRN
  Administered 2013-07-24: 80 mg via INTRAVENOUS
  Administered 2013-07-24: 200 mg via INTRAVENOUS

## 2013-07-24 MED ORDER — LIDOCAINE HCL (CARDIAC) 20 MG/ML IV SOLN
INTRAVENOUS | Status: DC | PRN
  Administered 2013-07-24: 100 mg via INTRAVENOUS

## 2013-07-24 MED ORDER — OXYCODONE-ACETAMINOPHEN 10-325 MG PO TABS: 1.0000 | ORAL_TABLET | ORAL | Status: DC | PRN

## 2013-07-24 MED ORDER — CEFAZOLIN SODIUM-DEXTROSE 2-3 GM-% IV SOLR
2.0000 g | INTRAVENOUS | Status: AC
  Administered 2013-07-24: 2 g via INTRAVENOUS

## 2013-07-24 MED ORDER — CHLORHEXIDINE GLUCONATE 4 % EX LIQD: 60.0000 mL | Freq: Once | CUTANEOUS | Status: DC

## 2013-07-24 MED ORDER — BUPIVACAINE HCL (PF) 0.5 % IJ SOLN
INTRAMUSCULAR | Status: AC
  Filled 2013-07-24: qty 30

## 2013-07-24 MED ORDER — LIDOCAINE HCL (PF) 1 % IJ SOLN
INTRAMUSCULAR | Status: AC
  Filled 2013-07-24: qty 30

## 2013-07-24 MED ORDER — FENTANYL CITRATE 0.05 MG/ML IJ SOLN
INTRAMUSCULAR | Status: AC
  Filled 2013-07-24: qty 8

## 2013-07-24 MED ORDER — CEFAZOLIN SODIUM-DEXTROSE 2-3 GM-% IV SOLR
INTRAVENOUS | Status: AC
  Filled 2013-07-24: qty 50

## 2013-07-24 MED ORDER — BUPIVACAINE-EPINEPHRINE PF 0.5-1:200000 % IJ SOLN
INTRAMUSCULAR | Status: DC | PRN
  Administered 2013-07-24: 30 mL

## 2013-07-24 SURGICAL SUPPLY — 53 items
BANDAGE ELASTIC 3 VELCRO ST LF (GAUZE/BANDAGES/DRESSINGS) ×2 IMPLANT
BANDAGE ELASTIC 4 VELCRO ST LF (GAUZE/BANDAGES/DRESSINGS) IMPLANT
BANDAGE GAUZE ELAST BULKY 4 IN (GAUZE/BANDAGES/DRESSINGS) ×2 IMPLANT
BENZOIN TINCTURE PRP APPL 2/3 (GAUZE/BANDAGES/DRESSINGS) IMPLANT
BLADE SURG 15 STRL LF DISP TIS (BLADE) ×2 IMPLANT
BLADE SURG 15 STRL SS (BLADE) ×2
BNDG COHESIVE 1X5 TAN STRL LF (GAUZE/BANDAGES/DRESSINGS) ×2 IMPLANT
BNDG COHESIVE 3X5 TAN STRL LF (GAUZE/BANDAGES/DRESSINGS) ×2 IMPLANT
BNDG ESMARK 4X9 LF (GAUZE/BANDAGES/DRESSINGS) ×2 IMPLANT
CORDS BIPOLAR (ELECTRODE) ×2 IMPLANT
COVER MAYO STAND STRL (DRAPES) ×2 IMPLANT
COVER TABLE BACK 60X90 (DRAPES) ×2 IMPLANT
CUFF TOURNIQUET SINGLE 18IN (TOURNIQUET CUFF) ×2 IMPLANT
DECANTER SPIKE VIAL GLASS SM (MISCELLANEOUS) IMPLANT
DRAPE EXTREMITY T 121X128X90 (DRAPE) ×2 IMPLANT
DRAPE OEC MINIVIEW 54X84 (DRAPES) ×2 IMPLANT
DRAPE SURG 17X23 STRL (DRAPES) ×2 IMPLANT
DRSG EMULSION OIL 3X3 NADH (GAUZE/BANDAGES/DRESSINGS) ×2 IMPLANT
GLOVE BIOGEL PI IND STRL 7.0 (GLOVE) ×1 IMPLANT
GLOVE BIOGEL PI IND STRL 8.5 (GLOVE) ×1 IMPLANT
GLOVE BIOGEL PI INDICATOR 7.0 (GLOVE) ×1
GLOVE BIOGEL PI INDICATOR 8.5 (GLOVE) ×1
GLOVE ECLIPSE 6.5 STRL STRAW (GLOVE) ×2 IMPLANT
GLOVE SURG ORTHO 8.0 STRL STRW (GLOVE) ×2 IMPLANT
GOWN PREVENTION PLUS XLARGE (GOWN DISPOSABLE) ×4 IMPLANT
GOWN PREVENTION PLUS XXLARGE (GOWN DISPOSABLE) ×2 IMPLANT
NEEDLE HYPO 25X1 1.5 SAFETY (NEEDLE) ×2 IMPLANT
NS IRRIG 1000ML POUR BTL (IV SOLUTION) ×2 IMPLANT
PACK BASIN DAY SURGERY FS (CUSTOM PROCEDURE TRAY) ×2 IMPLANT
PAD CAST 3X4 CTTN HI CHSV (CAST SUPPLIES) ×1 IMPLANT
PADDING CAST ABS 3INX4YD NS (CAST SUPPLIES)
PADDING CAST ABS 4INX4YD NS (CAST SUPPLIES) ×1
PADDING CAST ABS COTTON 3X4 (CAST SUPPLIES) IMPLANT
PADDING CAST ABS COTTON 4X4 ST (CAST SUPPLIES) ×1 IMPLANT
PADDING CAST COTTON 3X4 STRL (CAST SUPPLIES) ×1
SLING ARM FOAM STRAP MED (SOFTGOODS) ×2 IMPLANT
SPLINT FIBERGLASS 4X30 (CAST SUPPLIES) ×2 IMPLANT
SPLINT PLASTER CAST XFAST 3X15 (CAST SUPPLIES) IMPLANT
SPLINT PLASTER XTRA FASTSET 3X (CAST SUPPLIES)
SPONGE GAUZE 4X4 12PLY (GAUZE/BANDAGES/DRESSINGS) ×2 IMPLANT
STOCKINETTE 4X48 STRL (DRAPES) ×2 IMPLANT
STRIP CLOSURE SKIN 1/2X4 (GAUZE/BANDAGES/DRESSINGS) IMPLANT
SUCTION FRAZIER TIP 10 FR DISP (SUCTIONS) ×2 IMPLANT
SUT MNCRL AB 3-0 PS2 18 (SUTURE) IMPLANT
SUT MNCRL AB 4-0 PS2 18 (SUTURE) IMPLANT
SUT PROLENE 4 0 PS 2 18 (SUTURE) ×2 IMPLANT
SUT VICRYL 4-0 PS2 18IN ABS (SUTURE) ×2 IMPLANT
SYR BULB 3OZ (MISCELLANEOUS) ×2 IMPLANT
SYR CONTROL 10ML LL (SYRINGE) ×2 IMPLANT
TOWEL OR 17X24 6PK STRL BLUE (TOWEL DISPOSABLE) ×2 IMPLANT
TRAY DSU PREP LF (CUSTOM PROCEDURE TRAY) ×2 IMPLANT
TUBING STERILE (MISCELLANEOUS) ×2 IMPLANT
UNDERPAD 30X30 INCONTINENT (UNDERPADS AND DIAPERS) ×2 IMPLANT

## 2013-07-24 NOTE — H&P (Signed)
[name] is an 57 y.o. female.   Chief Complaint: RIGHT WRIST STIFFNESS AND FINGER STIFFNESS HPI: RHD FEMALE SUSTAINED DISTAL RADIUS FRACTURE AND UNDERWENT ORIF PT WITH LIMITATIONS IN WRIST Randon Goldsmith AND DIGITAL MOTION PT HERE FOR SURGERY  Past Medical History  Diagnosis Date  . Anxiety 2009  . Arthritis 2000  . Fibroid 2003  . Hot flashes, menopausal 2012  . Allergy     seasonal  . RLS (restless legs syndrome)   . Wears glasses   . Depression     Past Surgical History  Procedure Laterality Date  . Neck surgery  2003    Cervical vertebroplasty   . Colonoscopy    . Open reduction internal fixation (orif) distal radial fracture Right 11/08/2012    Procedure: OPEN REDUCTION INTERNAL FIXATION (ORIF) RIGHT DISTAL RADIUS FRACTURE;  Surgeon: Sharma Covert, MD;  Location: [facility];  Service: Orthopedics;  Laterality: Right;    Family History  Problem Relation Age of Onset  . Hypertension Mother   . Hyperlipidemia Mother   . Early death Sister 59    Drug overdose  . Dementia Father   . Cancer Father     Throat cancer   . Hypertension Daughter    Social History:  reports that she has been smoking Cigarettes.  She has a 32 pack-year smoking history. She has never used smokeless tobacco. She reports that she drinks alcohol. She reports that she does not use illicit drugs.  Allergies:  Allergies  Allergen Reactions  . Codeine Nausea And Vomiting and Other (See Comments)    Hallucinations    Medications Prior to Admission  Medication Sig Dispense Refill  . diphenhydrAMINE (SOMINEX) 25 MG tablet Take 25 mg by mouth at bedtime as needed for sleep.      Marland Kitchen docusate sodium (COLACE) 100 MG capsule Take 1 capsule (100 mg total) by mouth 2 (two) times daily.  30 capsule  6  . gabapentin (NEURONTIN) 300 MG capsule Take 1 capsule (300 mg total) by mouth 2 (two) times daily. Start with 1 capsule for three days and then increase to two daily  180 capsule  3  .  methocarbamol (ROBAXIN) 500 MG tablet Take 1 tablet (500 mg total) by mouth 4 (four) times daily.  30 tablet  0  . oxyCODONE-acetaminophen (PERCOCET) 10-325 MG per tablet Take 1 tablet by mouth every 4 (four) hours as needed for pain.  50 tablet  0  . diclofenac sodium (VOLTAREN) 1 % GEL Apply 1 application topically 4 (four) times daily.  1 Tube  3  . HYDROcodone-acetaminophen (NORCO) 5-325 MG per tablet Take 1 tablet by mouth every 4 (four) hours as needed for pain.  50 tablet  0  . venlafaxine XR (EFFEXOR-XR) 37.5 MG 24 hr capsule Take 1 capsule (37.5 mg total) by mouth daily.  180 capsule  3    Results for orders placed during the hospital encounter of 07/24/13 (from the past 48 hour(s))  POCT HEMOGLOBIN-HEMACUE     Status: None   Collection Time    07/24/13 12:56 PM      Result Value Range   Hemoglobin 14.3  12.0 - 15.0 g/dL   No results found.  ROS NO RECENT ILLNESSES OR HOSPITALIZATIONS  Blood pressure 144/85, pulse 94, temperature 97.8 F (36.6 C), temperature source Oral, resp. rate 20, height 5\' 8"  (1.727 m), weight 75.751 kg (167 lb), SpO2 100.00%. Physical Exam   General Appearance:  Alert, cooperative, no distress, appears  stated age  Head:  Normocephalic, without obvious abnormality, atraumatic  Eyes:  Pupils equal, conjunctiva/corneas clear,         Throat: Lips, mucosa, and tongue normal; teeth and gums normal  Neck: No visible masses     Lungs:   respirations unlabored  Chest Wall:  No tenderness or deformity  Heart:  Regular rate and rhythm,  Abdomen:   Soft, non-tender,         Extremities: RIGHT HAND WELL HEALED VOLAR INCISION LIMITED DIGITAL MOTION OF MP/PIP AND DIP JOINTS MARKED LIMITATIONS IN SUPINATION FINGERS WARM WELL PERFUSED GOOD RADIAL PULSE  Pulses: 2+ and symmetric  Skin: Skin color, texture, turgor normal, no rashes or lesions     Neurologic: Normal    Assessment/Plan RIGHT WRIST AND DIGITAL ARTHROFIBROSIS  RIGHT WRIST DEEP HARDWARE  REMOVAL AND JOINT RELEASE AND DIGITAL MANIPULATION  R/B/A DISCUSSED WITH PT IN OFFICE.  PT VOICED UNDERSTANDING OF PLAN CONSENT SIGNED DAY OF SURGERY PT SEEN AND EXAMINED PRIOR TO OPERATIVE PROCEDURE/DAY OF SURGERY SITE MARKED. QUESTIONS ANSWERED WILL GO HOME FOLLOWING SURGERY  Sharma Covert 07/24/2013, 1:11 PM

## 2013-07-24 NOTE — Brief Op Note (Signed)
07/24/2013  2:14 PM  PATIENT:  [name]  57 y.o. female  PRE-OPERATIVE DIAGNOSIS:  RIGHT WRIST RETAINED HARDWARE/JOINT CONTRACTURE/RIGHT HAND PERIARTICULAR FIBROSIS  POST-OPERATIVE DIAGNOSIS:  SAME  PROCEDURE:  Procedure(s): RIGHT WRIST HARDWARE REMOVAL, JOINT RELEASE, RIGHT HAND MANIPULATION UNDER ANESTHESIA (Right)  SURGEON:  Surgeon(s) and Role:    * Sharma Covert, MD - Primary  PHYSICIAN ASSISTANT:   ASSISTANTS: none   ANESTHESIA:   general  EBL:     BLOOD ADMINISTERED:none  DRAINS: none   LOCAL MEDICATIONS USED:  MARCAINE     SPECIMEN:  No Specimen  DISPOSITION OF SPECIMEN:  N/A  COUNTS:  YES  TOURNIQUET:    DICTATION: [id number]  PLAN OF CARE: Discharge to home after PACU  PATIENT DISPOSITION:  PACU - hemodynamically stable.   Delay start of Pharmacological VTE agent (>24hrs) due to surgical blood loss or risk of bleeding: not applicable

## 2013-07-24 NOTE — Anesthesia Preprocedure Evaluation (Signed)
Anesthesia Evaluation  Patient identified by MRN, date of birth, ID band Patient awake    Reviewed: Allergy & Precautions, H&P , NPO status , Patient's Chart, lab work & pertinent test results  Airway Mallampati: II  Neck ROM: full    Dental   Pulmonary Current Smoker,          Cardiovascular negative cardio ROS      Neuro/Psych Anxiety Depression    GI/Hepatic   Endo/Other    Renal/GU      Musculoskeletal  (+) Arthritis -,   Abdominal   Peds  Hematology   Anesthesia Other Findings   Reproductive/Obstetrics                           Anesthesia Physical Anesthesia Plan  ASA: II  Anesthesia Plan: General and Regional   Post-op Pain Management: MAC Combined w/ Regional for Post-op pain   Induction: Intravenous  Airway Management Planned: LMA  Additional Equipment:   Intra-op Plan:   Post-operative Plan:   Informed Consent: I have reviewed the patients History and Physical, chart, labs and discussed the procedure including the risks, benefits and alternatives for the proposed anesthesia with the patient or authorized representative who has indicated his/her understanding and acceptance.     Plan Discussed with: CRNA, Anesthesiologist and Surgeon  Anesthesia Plan Comments:         Anesthesia Quick Evaluation

## 2013-07-24 NOTE — Anesthesia Procedure Notes (Addendum)
Anesthesia Regional Block:  Infraclavicular brachial plexus block  Pre-Anesthetic Checklist: ,, timeout performed, Correct Patient, Correct Site, Correct Laterality, Correct Procedure, Correct Position, site marked, Risks and benefits discussed,  Surgical consent,  Pre-op evaluation,  At surgeon's request and post-op pain management  Laterality: Right  Prep: chloraprep       Needles:  Injection technique: Single-shot  Needle Type: Echogenic Stimulator Needle      Needle Gauge: 21 and 21 G    Additional Needles:  Procedures: ultrasound guided (picture in chart) and nerve stimulator Infraclavicular brachial plexus block  Nerve Stimulator or Paresthesia:  Response: biceps flexion, 0.45 mA,   Additional Responses:   Narrative:  Start time: 07/24/2013 1:35 PM End time: 07/24/2013 1:47 PM Injection made incrementally with aspirations every 5 mL.  Performed by: Personally  Anesthesiologist: Dr Chaney Malling  Additional Notes: Functioning IV was confirmed and monitors were applied.  A 90mm 21ga Arrow echogenic stimulator needle was used. Sterile prep and drape,hand hygiene and sterile gloves were used.  Negative aspiration and negative test dose prior to incremental administration of local anesthetic. The patient tolerated the procedure well.  Ultrasound guidance: relevent anatomy identified, needle position confirmed, local anesthetic spread visualized around nerve(s), vascular puncture avoided.  Image printed for medical record.   Infraclavicular brachial plexus block Procedure Name: LMA Insertion Date/Time: 07/24/2013 2:24 PM Performed by: Caren Macadam Pre-anesthesia Checklist: Patient identified, Emergency Drugs available, Suction available and Patient being monitored Patient Re-evaluated:Patient Re-evaluated prior to inductionOxygen Delivery Method: Circle System Utilized Preoxygenation: Pre-oxygenation with 100% oxygen Intubation Type: IV induction Ventilation: Mask  ventilation without difficulty LMA: LMA inserted LMA Size: 4.0 Number of attempts: 1 Airway Equipment and Method: bite block Placement Confirmation: positive ETCO2 and breath sounds checked- equal and bilateral Tube secured with: Tape Dental Injury: Teeth and Oropharynx as per pre-operative assessment

## 2013-07-24 NOTE — Transfer of Care (Signed)
Immediate Anesthesia Transfer of Care Note  Patient: [name]  Procedure(s) Performed: Procedure(s): RIGHT WRIST HARDWARE REMOVAL, JOINT RELEASE, RIGHT HAND MANIPULATION UNDER ANESTHESIA (Right)  Patient Location: PACU  Anesthesia Type:General and GA combined with regional for post-op pain  Level of Consciousness: awake and alert   Airway & Oxygen Therapy: Patient Spontanous Breathing and Patient connected to face mask oxygen  Post-op Assessment: Report given to PACU RN and Post -op Vital signs reviewed and stable  Post vital signs: Reviewed and stable  Complications: No apparent anesthesia complications

## 2013-07-24 NOTE — Anesthesia Postprocedure Evaluation (Signed)
Anesthesia Post Note  Patient: [name]  Procedure(s) Performed: Procedure(s) (LRB): RIGHT WRIST HARDWARE REMOVAL, JOINT RELEASE, RIGHT HAND MANIPULATION UNDER ANESTHESIA (Right)  Anesthesia type: General  Patient location: PACU  Post pain: Pain level controlled and Adequate analgesia  Post assessment: Post-op Vital signs reviewed, Patient's Cardiovascular Status Stable, Respiratory Function Stable, Patent Airway and Pain level controlled  Last Vitals:  Filed Vitals:   07/24/13 1630  BP: 147/84  Pulse: 90  Temp:   Resp: 19    Post vital signs: Reviewed and stable  Level of consciousness: awake, alert  and oriented  Complications: No apparent anesthesia complications

## 2013-07-24 NOTE — Progress Notes (Signed)
Assisted Dr. Hodierne with right, ultrasound guided, supraclavicular block. Side rails up, monitors on throughout procedure. See vital signs in flow sheet. Tolerated Procedure well.  

## 2013-07-25 ENCOUNTER — Encounter (HOSPITAL_BASED_OUTPATIENT_CLINIC_OR_DEPARTMENT_OTHER): Payer: Self-pay | Admitting: Orthopedic Surgery

## 2013-07-26 NOTE — Op Note (Signed)
[name], [name]                ACCOUNT NO.:  [id number]  MEDICAL RECORD NO.:  [id number]  LOCATION:                                 FACILITY:  PHYSICIAN:  Madelynn Done, MD  DATE OF BIRTH:  Jun 15, 1956  DATE OF PROCEDURE:  07/24/2013 DATE OF DISCHARGE:  07/24/2013                              OPERATIVE REPORT   PREOPERATIVE DIAGNOSES: 1. Right index finger, periarticular fibrosis. 2. Right long finger, periarticular fibrosis. 3. Right ring finger, periarticular fibrosis. 4. Small finger, periarticular fibrosis. 5. Right thumb, periarticular fibrosis. 6. Right wrist symptomatic retained hardware in joint contracture.  POSTOPERATIVE DIAGNOSES: 1. Right index finger, periarticular fibrosis. 2. Right long finger, periarticular fibrosis. 3. Right ring finger, periarticular fibrosis. 4. Small finger, periarticular fibrosis. 5. Right thumb, periarticular fibrosis. 6. Right wrist symptomatic retained hardware in joint contracture.  ATTENDING PHYSICIAN:  Sharma Covert IV, MD, who scrubbed and present for the entire procedure.  ASSISTANT SURGEON:  None.  ANESTHESIA:  Supraclavicular block and general anesthesia via LMA.  TOURNIQUET TIME:  Less than 1 hour at 250 mmHg.  SURGICAL PROCEDURE: 1. Right wrist removal of deep implant and buried plates and screws. 2. Right wrist distal radioulnar joint, volar capsulotomy, and joint     release. 3. Radiograph reviews, right wrist. 4. Manipulation under anesthesia, right index finger, MP, PIP, and DIP     joints. 5. Right long finger manipulation under anesthesia, MP, PIP, and DIP     joints. 6. Right ring finger manipulation under anesthesia, MP, PIP, and DIP     joints. 7. Right small finger manipulation under anesthesia, MP, PIP, and DIP     joints. 8. Right thumb manipulation under anesthesia, CMC, MP, and IP joints.  SURGICAL INDICATIONS:  [name] [name] is a right-hand-dominant female who sustained the closed distal radius  fracture.  The patient went on to heal the distal radius fracture, however, she has had very difficult time and complications following surgery in terms we are getting back her movement.  The patient elected to undergo the above procedure. Risks, benefits, and alternatives were discussed in detail with the patient.  A signed informed consent was obtained.  Risks include, but are not limited to bleeding, infection, damage to nearby nerves, arteries, or tendons, loss motion of wrist and digits, incomplete relief of symptoms, and need for further surgical intervention.  DESCRIPTION OF PROCEDURE:  The patient was properly identified in the preop holding area and marked with a permanent marker made on the right hand to indicate the correct operative site.  The supraclavicular block was then performed by anesthesia.  The patient was then brought back to the operating room, placed supine on the anesthesia room table.  General anesthesia was administered.  The patient tolerated this well.  A well- padded tourniquet was then placed on the right brachium and sealed with 1000 drape.  The right upper extremity was then prepped and draped in normal sterile fashion.  Time-out was called, correct site was identified, and procedure then begun.  Attention then turned to the right hand where after adequate anesthesia, closed manipulation was then performed of the thumb CMC, MP, and IP joints.  Following this, under anesthesia, manipulation was then carried out of the MP joint and IP joints to the index, long, ring, and small.  I was able to make them into a full composite fist.  The patient had significant periarticular fibrosis and joint contractures, more so extrinsic tightness.  Following manipulation of all these, all the joints in the limb was then elevated using Esmarch exsanguination.  Tourniquet insufflated.  The previous incision was then used.  Deep dissection then carried down through the floor  of the FCR sheath, and the pronator quadratus was then released. Exposure of the plate was then carried out without complicating features.  The plate and screws were then removed.  Exposure of the distal radioulnar joint, volar capsule was then carried out under direct visualization.  Distal radioulnar joint, volar capsule was then released.  This allowed for improvement in the forearm rotation most supination and pronation.  After capsulotomy and joint release, the patient did near have almost had approximately 60 degrees of supination, over 60 degrees of pronation.  She still has limitations in her wrist flexion and extension.  Following this, the joint and the wound was then thoroughly irrigated. Final radiographs were then obtained which did show the healed distal radius fracture, and minor regularity in the distal radioulnar joint with the ulnar styloid nonunion and also the VISI deformity within the wrist with a flexed posture of the lunate.  The wound was then copiously irrigated.  After adequate release, subcutaneous tissues were then closed with 4-0 Vicryl and skin closed with simple running 4-0 Prolene.  Adaptic dressing, sterile compressive bandage then applied.  The patient was then placed in a small bulky dressing, and then the fingers were then brought down and then held in place in full flexion.  The patient was then extubated and taken to recovery room in good condition.  POSTPROCEDURAL PLAN:  The patient will be discharged to home, seen back in the office in approximately 1 week for wound check and motion assessment.  We will get immediately into the therapy, working on her movement, forearm rotation, and digital mobility.  Radiographs at the first visit.  Radiographs 3 views of the wrist did show the removal of the implant with mild incongruity of the distal radioulnar joint and the ulnar styloid nonunion with the VISI deformity within the midcarpal  joint.     Madelynn Done, MD     FWO/MEDQ  D:  07/24/2013  T:  07/25/2013  Job:  [id number]

## 2013-08-07 ENCOUNTER — Ambulatory Visit: Payer: PRIVATE HEALTH INSURANCE | Attending: Orthopedic Surgery | Admitting: Occupational Therapy

## 2013-08-07 DIAGNOSIS — M6281 Muscle weakness (generalized): Secondary | ICD-10-CM | POA: Insufficient documentation

## 2013-08-07 DIAGNOSIS — IMO0001 Reserved for inherently not codable concepts without codable children: Secondary | ICD-10-CM | POA: Insufficient documentation

## 2013-08-07 DIAGNOSIS — M2550 Pain in unspecified joint: Secondary | ICD-10-CM | POA: Insufficient documentation

## 2013-08-14 ENCOUNTER — Ambulatory Visit: Payer: PRIVATE HEALTH INSURANCE | Attending: Orthopedic Surgery | Admitting: Occupational Therapy

## 2013-08-14 DIAGNOSIS — IMO0001 Reserved for inherently not codable concepts without codable children: Secondary | ICD-10-CM | POA: Insufficient documentation

## 2013-08-14 DIAGNOSIS — M6281 Muscle weakness (generalized): Secondary | ICD-10-CM | POA: Insufficient documentation

## 2013-08-14 DIAGNOSIS — M2550 Pain in unspecified joint: Secondary | ICD-10-CM | POA: Insufficient documentation

## 2013-08-28 ENCOUNTER — Ambulatory Visit: Payer: PRIVATE HEALTH INSURANCE | Admitting: Occupational Therapy

## 2013-09-02 ENCOUNTER — Ambulatory Visit: Payer: PRIVATE HEALTH INSURANCE | Admitting: Occupational Therapy

## 2013-09-18 ENCOUNTER — Ambulatory Visit: Payer: PRIVATE HEALTH INSURANCE | Attending: Orthopedic Surgery | Admitting: Occupational Therapy

## 2013-09-18 DIAGNOSIS — M6281 Muscle weakness (generalized): Secondary | ICD-10-CM | POA: Insufficient documentation

## 2013-09-18 DIAGNOSIS — M2550 Pain in unspecified joint: Secondary | ICD-10-CM | POA: Insufficient documentation

## 2013-09-18 DIAGNOSIS — IMO0001 Reserved for inherently not codable concepts without codable children: Secondary | ICD-10-CM | POA: Insufficient documentation

## 2013-09-25 ENCOUNTER — Ambulatory Visit: Payer: PRIVATE HEALTH INSURANCE | Admitting: Occupational Therapy

## 2013-09-26 ENCOUNTER — Ambulatory Visit: Payer: PRIVATE HEALTH INSURANCE | Admitting: Occupational Therapy

## 2013-10-02 ENCOUNTER — Ambulatory Visit: Payer: PRIVATE HEALTH INSURANCE | Admitting: Occupational Therapy

## 2013-10-03 ENCOUNTER — Ambulatory Visit: Payer: PRIVATE HEALTH INSURANCE | Admitting: Occupational Therapy

## 2013-10-09 ENCOUNTER — Ambulatory Visit: Payer: PRIVATE HEALTH INSURANCE | Admitting: Occupational Therapy

## 2013-10-18 ENCOUNTER — Ambulatory Visit: Payer: PRIVATE HEALTH INSURANCE | Attending: Orthopedic Surgery | Admitting: Occupational Therapy

## 2013-10-18 DIAGNOSIS — IMO0001 Reserved for inherently not codable concepts without codable children: Secondary | ICD-10-CM | POA: Insufficient documentation

## 2013-10-18 DIAGNOSIS — M6281 Muscle weakness (generalized): Secondary | ICD-10-CM | POA: Insufficient documentation

## 2013-10-18 DIAGNOSIS — M2550 Pain in unspecified joint: Secondary | ICD-10-CM | POA: Insufficient documentation

## 2013-10-23 ENCOUNTER — Ambulatory Visit: Payer: PRIVATE HEALTH INSURANCE | Admitting: Occupational Therapy

## 2013-10-30 ENCOUNTER — Ambulatory Visit: Payer: PRIVATE HEALTH INSURANCE | Admitting: Occupational Therapy

## 2013-11-05 ENCOUNTER — Encounter: Payer: PRIVATE HEALTH INSURANCE | Admitting: Occupational Therapy

## 2013-11-08 ENCOUNTER — Encounter: Payer: PRIVATE HEALTH INSURANCE | Admitting: Occupational Therapy

## 2013-11-14 ENCOUNTER — Ambulatory Visit: Payer: PRIVATE HEALTH INSURANCE | Attending: Orthopedic Surgery | Admitting: Occupational Therapy

## 2013-11-14 DIAGNOSIS — M2550 Pain in unspecified joint: Secondary | ICD-10-CM | POA: Insufficient documentation

## 2013-11-14 DIAGNOSIS — M6281 Muscle weakness (generalized): Secondary | ICD-10-CM | POA: Insufficient documentation

## 2013-11-14 DIAGNOSIS — IMO0001 Reserved for inherently not codable concepts without codable children: Secondary | ICD-10-CM | POA: Insufficient documentation

## 2013-11-15 ENCOUNTER — Ambulatory Visit: Payer: PRIVATE HEALTH INSURANCE | Admitting: Occupational Therapy

## 2013-11-22 ENCOUNTER — Encounter: Payer: PRIVATE HEALTH INSURANCE | Admitting: Occupational Therapy

## 2013-12-09 ENCOUNTER — Telehealth: Payer: Self-pay | Admitting: Family Medicine

## 2013-12-09 NOTE — Telephone Encounter (Signed)
Would like a refill on her anxiety med Has had death in family and will be going to the funeral Needs meds for this, Please call her if she is able to do this

## 2013-12-10 NOTE — Telephone Encounter (Signed)
Patient calls again, anxiety levels have increased due to death in her family. Patient would really like to speak to MD about getting meds. Please call [phone]

## 2013-12-10 NOTE — Telephone Encounter (Signed)
Which anxiety medication is she referring to? I do not see any on her MAR. If she feels that she needs a prescription for a medication that I have never prescribed for her I think it is probably best for her safety to schedule an appointment with a provider in clinic. I am so sorry to hear about her loss.  [facility], MD

## 2013-12-11 NOTE — Telephone Encounter (Signed)
LMOVM for pt to return call .[name]  

## 2013-12-12 ENCOUNTER — Ambulatory Visit: Payer: PRIVATE HEALTH INSURANCE | Admitting: Family Medicine

## 2013-12-12 ENCOUNTER — Encounter: Payer: Self-pay | Admitting: Family Medicine

## 2013-12-12 ENCOUNTER — Ambulatory Visit (INDEPENDENT_AMBULATORY_CARE_PROVIDER_SITE_OTHER): Payer: PRIVATE HEALTH INSURANCE | Admitting: Family Medicine

## 2013-12-12 VITALS — BP 150/88 | HR 102 | Temp 97.2°F | Ht 68.0 in | Wt 167.0 lb

## 2013-12-12 DIAGNOSIS — F419 Anxiety disorder, unspecified: Secondary | ICD-10-CM | POA: Insufficient documentation

## 2013-12-12 DIAGNOSIS — F410 Panic disorder [episodic paroxysmal anxiety] without agoraphobia: Secondary | ICD-10-CM

## 2013-12-12 DIAGNOSIS — N951 Menopausal and female climacteric states: Secondary | ICD-10-CM

## 2013-12-12 MED ORDER — OXYCODONE HCL 5 MG PO TABS: 5.0000 mg | ORAL_TABLET | ORAL | Status: DC | PRN

## 2013-12-12 MED ORDER — CITALOPRAM HYDROBROMIDE 20 MG PO TABS: 20.0000 mg | ORAL_TABLET | Freq: Every day | ORAL | Status: DC

## 2013-12-12 MED ORDER — LORAZEPAM 0.5 MG PO TABS: 0.5000 mg | ORAL_TABLET | Freq: Three times a day (TID) | ORAL | Status: DC | PRN

## 2013-12-12 NOTE — Assessment & Plan Note (Addendum)
Not controlled. Given the concurrent Nocturnal postmenopausal hot flashes that are significantly impairing her sleep, Recommend start of Citalopram 20 mg daily with reassessment of efficacy for Panic Attacks and Nocturnal vasomotor symptoms in 1 to 2 months. Recommended pt increase her evening Gabapentin to 600 mg dose.   Discussed other option of HRT for treatment of symptoms as long as patient accepts known HRT risks to achieve the benefits of HRT.

## 2013-12-12 NOTE — Patient Instructions (Signed)
Start taking Citalopram 20 mg tablet, one tablet daily.  This will slowly raise the threshold for you to have a panic attack. It will take at least 2 months of taking it every day for it to have its full effect.   Take the Lorazepam (Ativan) one tablet if you need it for Panic attacks that occur while you are waiting for the Citalopram to have its full effect.   Take one Lorazepam about one hour before your Father's funeral.   Ideally, the Citalopram will help decrease your hot flashes (it takes two months for it to take full effect in reducing your hot flashes)  Panic Attacks Panic attacks are sudden, short feelings of great fear or discomfort. You may have them for no reason when you are relaxed, when you are uneasy (anxious), or when you are sleeping.  HOME CARE  Take all your medicines as told.  Check with your doctor before starting new medicines.  Keep all doctor visits. GET HELP IF:  You are not able to take your medicines as told.  Your symptoms do not get better.  Your symptoms get worse. GET HELP RIGHT AWAY IF:  Your attacks seem different than your normal attacks.  You have thoughts about hurting yourself or others.  You take panic attack medicine and you have a side effect. MAKE SURE YOU:  Understand these instructions.  Will watch your condition.  Will get help right away if you are not doing well or get worse.  Menopause Menopause is the normal time of life when menstrual periods stop completely. Menopause is complete when you have missed 12 consecutive menstrual periods. It usually occurs between the ages of 2 years and 68 years. Very rarely does a woman develop menopause before the age of 50 years. At menopause, your ovaries stop producing the female hormones estrogen and progesterone. This can cause undesirable symptoms and also affect your health. Sometimes the symptoms may occur 4 5 years before the menopause begins. There is no relationship between  menopause and:  Oral contraceptives.  Number of children you had.  Race.  The age your menstrual periods started (menarche). Heavy smokers and very thin women may develop menopause earlier in life. CAUSES  The ovaries stop producing the female hormones estrogen and progesterone.  Other causes include:  Surgery to remove both ovaries.  The ovaries stop functioning for no known reason.  Tumors of the pituitary gland in the brain.  Medical disease that affects the ovaries and hormone production.  Radiation treatment to the abdomen or pelvis.  Chemotherapy that affects the ovaries. SYMPTOMS   Hot flashes.  Night sweats.  Decrease in sex drive.  Vaginal dryness and thinning of the vagina causing painful intercourse.  Dryness of the skin and developing wrinkles.  Headaches.  Tiredness.  Irritability.  Memory problems.  Weight gain.  Bladder infections.  Hair growth of the face and chest.  Infertility. More serious symptoms include:  Loss of bone (osteoporosis) causing breaks (fractures).  Depression.  Hardening and narrowing of the arteries (atherosclerosis) causing heart attacks and strokes. DIAGNOSIS   When the menstrual periods have stopped for 12 straight months.  Physical exam.  Hormone studies of the blood. TREATMENT  There are many treatment choices and nearly as many questions about them. The decisions to treat or not to treat menopausal changes is an individual choice made with your health care provider. Your health care provider can discuss the treatments with you. Together, you can decide which treatment will  work best for you. Your treatment choices may include:   Hormone therapy (estrogen and progesterone).  Non-hormonal medicines.  Treating the individual symptoms with medicine (for example antidepressants for depression).  Herbal medicines that may help specific symptoms.  Counseling by a psychiatrist or psychologist.  Group  therapy.  Lifestyle changes including:  Eating healthy.  Regular exercise.  Limiting caffeine and alcohol.  Stress management and meditation.  No treatment. HOME CARE INSTRUCTIONS   Take the medicine your health care provider gives you as directed.  Get plenty of sleep and rest.  Exercise regularly.  Eat a diet that contains calcium (good for the bones) and soy products (acts like estrogen hormone).  Avoid alcoholic beverages.  Do not smoke.  If you have hot flashes, dress in layers.  Take supplements, calcium, and vitamin D to strengthen bones.  You can use over-the-counter lubricants or moisturizers for vaginal dryness.  Group therapy is sometimes very helpful.  Acupuncture may be helpful in some cases. SEEK MEDICAL CARE IF:   You are not sure you are in menopause.  You are having menopausal symptoms and need advice and treatment.  You are still having menstrual periods after age 11 years.  You have pain with intercourse.  Menopause is complete (no menstrual period for 12 months) and you develop vaginal bleeding.  You need a referral to a specialist (gynecologist, psychiatrist, or psychologist) for treatment. SEEK IMMEDIATE MEDICAL CARE IF:   You have severe depression.  You have excessive vaginal bleeding.  You fell and think you have a broken bone.  You have pain when you urinate.  You develop leg or chest pain.  You have a fast pounding heart beat (palpitations).  You have severe headaches.  You develop vision problems.  You feel a lump in your breast.  You have abdominal pain or severe indigestion. Document Released: 11/19/2003 Document Revised: 05/01/2013 Document Reviewed: 03/28/2013 [facility] Patient Information 2014 [place], [place].

## 2013-12-12 NOTE — Progress Notes (Signed)
Subjective:    Patient ID: [name], female    DOB: 11-01-1955, 58 y.o.   MRN: [id number]  HPI CC: 1) Anxiousness 2) Hot Flashes  Anxiousness PHQ-9  Over the last 2 weeks how ofter have you been bothered by any of the following problems? (Not at all = 0, Several days = 1, More than half the days = 2, Nearly every day = 3)  1. Little interest or pleasure in doing things 0 2. Feeling down, depressed, or hopeless 0 3. Trouble falling/staying asleep or sleeping too much 3 4. Feeling tired or having little energy 3 5. Poor appetite or overeating 0 6. Feeling bad about your self; that you are a failure or have let yourself or your family down 0 7. Trouble concentrating on things such as reading the newspaper or watching TV 0 8. Moving or speaking so slowly that other people could have noticed. Or the opposite - being so fidgety or restless that you have been moving around a lot more than usual 0 9. Thought that you would be better off dead or thoughts about hurting yourself in some way 0  Questions about Anxiety 1.  In the last 4 weeks, have you had an anxiety atttack -- suddenly feel fear or panic: Yes 2.  Has this happened to you before: Yes 3.  Do some of these attacks suddnely come out of the blue - NO, they occur when in stressful or physically painful situations.  4.  Do these attacks both you a lot or are you worried about having another attack? NO. 5.  During your last bad anxiety attack, did you have symptoms like shortness of breath, sweating, your heart racing, dizziness or faintness, tingling or numbness, or nausea or upset stomach? Yes.  Questions about Stressors In the last 4 weeks, how much have you been bothered by any of the following problems (0 = Not bothered, 1 = Bothered a little, 2 = Bothered a lot) 1. Worry about your health: 0 2. Your weight or how you look: 0 3. Little or not sexual desire or pleasure during sex: 2 4. Difficulties with significant other:  2 5. The stress of taking care of children, parents, or other family members: - 18. Stress at work outside of the home or school: - 7. Financial problems or worries: - 8. Having no one to turn to when you have a problem: 0 9. Something bad that happened recently: 0 10. Thinking or dreaming about something terrible that happened to you in the past -- like your house being destroyed, a severe accident, being hit or assaulted, or being forces to commit a sexual act: 2 11. What is the most stressful thing in your life right now? "My hand"  Question about Trauma 1. In the last year, have you been hit, slapped, kicked, or otherwise physically hurt by someone, or has anyone forces you to have an unwanted sexual act: -  Are you taking medications for anxiety, depression or stress? No.  Generalized Anxiety Disorder Checklist Over the last 2 weeks, how often have you been bothered by the following problems? (0 = Not at all, 1 =- Several days, 2 = More than half the days, 3 = Nearly every day)  1. Feeling nervous, anxious, or on edge: 0 2. Not being able to stop or control worrying: 0 3. Worrying too much about different things: 1 4. Having trouble relaxing: 1 5. Being so restless that it is hard to sit  still: 0 6. Becoming easily annoyed or irritable: 0 7. Feeling afraid as if something awful might happen: 0  Total Score: 2   Hot flashes Onset in 2012 Occurring almost nightly, interrupting sleep around 4 am.  Face and chest turn red and hot Has tried [employer] and [employer] (OTC) without improvement No history of MI, Stroke, Breast Cancer, blood clots No diarrhea        Review of Systems     Objective:   Physical Exam  Constitutional: Vital signs are normal. She appears well-developed. She does not have a sickly appearance. No distress.  Neurological: She is alert.  Psychiatric: She has a normal mood and affect. Her behavior is normal. Thought content normal. Her mood appears  not anxious. Her speech is not delayed. She is not agitated and not slowed.          Assessment & Plan:

## 2013-12-12 NOTE — Assessment & Plan Note (Addendum)
New problem Patient has Panic Attacks but they appear to be related to painful situations.  Another possible diagnostic category could be a specific phobia. Patient is having some grief reaction to the anticipated death of her father last week. Given the concurrent Nocturnal postmenopausal hot flashes that are significantly impairing her sleep, Recommend start of Citalopram 20 mg daily with reassessment of efficacy for Panic Attacks and Nocturnal vasomotor symptoms in 1 to 2 months.   Rx short-term lorazepam during grief period. No refills.  Pt given contact information for Grief counseling from [place].  RTC in 1 month.

## 2014-01-02 ENCOUNTER — Ambulatory Visit (INDEPENDENT_AMBULATORY_CARE_PROVIDER_SITE_OTHER): Payer: PRIVATE HEALTH INSURANCE | Admitting: Family Medicine

## 2014-01-02 ENCOUNTER — Encounter: Payer: Self-pay | Admitting: Family Medicine

## 2014-01-02 VITALS — BP 159/83 | HR 82 | Temp 98.1°F | Ht 68.5 in | Wt 162.0 lb

## 2014-01-02 DIAGNOSIS — L299 Pruritus, unspecified: Secondary | ICD-10-CM | POA: Insufficient documentation

## 2014-01-02 DIAGNOSIS — G2581 Restless legs syndrome: Secondary | ICD-10-CM

## 2014-01-02 LAB — COMPREHENSIVE METABOLIC PANEL
ALT: 13 U/L (ref 0–35)
AST: 13 U/L (ref 0–37)
Albumin: 4.6 g/dL (ref 3.5–5.2)
Alkaline Phosphatase: 81 U/L (ref 39–117)
BILIRUBIN TOTAL: 0.9 mg/dL (ref 0.2–1.2)
BUN: 16 mg/dL (ref 6–23)
CO2: 30 meq/L (ref 19–32)
CREATININE: 0.9 mg/dL (ref 0.50–1.10)
Calcium: 9.8 mg/dL (ref 8.4–10.5)
Chloride: 102 mEq/L (ref 96–112)
Glucose, Bld: 88 mg/dL (ref 70–99)
Potassium: 4.4 mEq/L (ref 3.5–5.3)
SODIUM: 140 meq/L (ref 135–145)
TOTAL PROTEIN: 7.6 g/dL (ref 6.0–8.3)

## 2014-01-02 LAB — CBC
HCT: 42.3 % (ref 36.0–46.0)
Hemoglobin: 14.1 g/dL (ref 12.0–15.0)
MCH: 28.1 pg (ref 26.0–34.0)
MCHC: 33.3 g/dL (ref 30.0–36.0)
MCV: 84.4 fL (ref 78.0–100.0)
PLATELETS: 422 10*3/uL — AB (ref 150–400)
RBC: 5.01 MIL/uL (ref 3.87–5.11)
RDW: 13.8 % (ref 11.5–15.5)
WBC: 10.3 10*3/uL (ref 4.0–10.5)

## 2014-01-02 NOTE — Assessment & Plan Note (Signed)
Chronic, likely idiopathic, related to dry skin which pt relays she and her family have. No rash or signs of bugbites. - CMET today. - Checking TSH. - Recommended light coat of vaseline or unscented cream like cetaphil. Continue using unscented sensitive body soap (currently using Dove which is good).

## 2014-01-02 NOTE — Assessment & Plan Note (Signed)
Most likely dx. No signs of infection or weakness. Pain is not described as "burning". - Will work up with TSH, CBC, and metabolic panel. - Continue gabapentin. - F/u 1 mo. Consider ABI with trace DP and PT pulses. Consider ropinerole if no improvement.

## 2014-01-02 NOTE — Patient Instructions (Signed)
Good to meet you today.  I am sorry your legs are not feeling any better. - We will check labs today and I will call you if anything is NOT normal. - Continue gabapentin. - Stay active. - Come back in 1 month so we can follow up.  For your itching, I am checking your liver function. I will call you if this is NOT normal. - Use a light coat of vaseline or cetaphil cream.  There is not an option for problems with your hand on the DMV form.   Best,  Hilton Sinclair, MD  Restless Legs Syndrome Restless legs syndrome is a movement disorder. It may also be called a sensori-motor disorder.  CAUSES  No one knows what specifically causes restless legs syndrome, but it tends to run in families. It is also more common in people with low iron, in pregnancy, in people who need dialysis, and those with nerve damage (neuropathy).Some medications may make restless legs syndrome worse.Those medications include drugs to treat high blood pressure, some heart conditions, nausea, colds, allergies, and depression. SYMPTOMS Symptoms include uncomfortable sensations in the legs. These leg sensations are worse during periods of inactivity or rest. They are also worse while sitting or lying down. Individuals that have the disorder describe sensations in the legs that feel like:  Pulling.  Drawing.  Crawling.  Worming.  Boring.  Tingling.  Pins and needles.  Prickling.  Pain. The sensations are usually accompanied by an overwhelming urge to move the legs. Sudden muscle jerks may also occur. Movement provides temporary relief from the discomfort. In rare cases, the arms may also be affected. Symptoms may interfere with going to sleep (sleep onset insomnia). Restless legs syndrome may also be related to periodic limb movement disorder (PLMD). PLMD is another more common motor disorder. It also causes interrupted sleep. The symptoms from PLMD usually occur most often when you are awake. TREATMENT   Treatment for restless legs syndrome is symptomatic. This means that the symptoms are treated.   Massage and cold compresses may provide temporary relief.  Walk, stretch, or take a cold or hot bath.  Get regular exercise and a good night's sleep.  Avoid caffeine, alcohol, nicotine, and medications that can make it worse.  Do activities that provide mental stimulation like discussions, needlework, and video games. These may be helpful if you are not able to walk or stretch. Some medications are effective in relieving the symptoms. However, many of these medications have side effects. Ask your caregiver about medications that may help your symptoms. Correcting iron deficiency may improve symptoms for some patients. Document Released: 08/19/2002 Document Revised: 11/21/2011 Document Reviewed: 11/25/2010 [facility] Patient Information 2014 [place].
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
## 2014-01-03 ENCOUNTER — Encounter: Payer: Self-pay | Admitting: Family Medicine

## 2014-01-03 LAB — TSH: TSH: 1.422 u[IU]/mL (ref 0.350–4.500)

## 2014-01-09 ENCOUNTER — Other Ambulatory Visit: Payer: Self-pay | Admitting: Orthopedic Surgery

## 2014-01-09 DIAGNOSIS — S52599A Other fractures of lower end of unspecified radius, initial encounter for closed fracture: Secondary | ICD-10-CM

## 2014-01-16 ENCOUNTER — Ambulatory Visit
Admission: RE | Admit: 2014-01-16 | Discharge: 2014-01-16 | Disposition: A | Discharge status: Home / Self Care | Payer: PRIVATE HEALTH INSURANCE | Source: Ambulatory Visit | Attending: Orthopedic Surgery | Admitting: Orthopedic Surgery

## 2014-01-16 DIAGNOSIS — S52599A Other fractures of lower end of unspecified radius, initial encounter for closed fracture: Secondary | ICD-10-CM

## 2014-01-21 ENCOUNTER — Ambulatory Visit (INDEPENDENT_AMBULATORY_CARE_PROVIDER_SITE_OTHER): Payer: PRIVATE HEALTH INSURANCE | Admitting: Family Medicine

## 2014-01-21 ENCOUNTER — Encounter: Payer: Self-pay | Admitting: Family Medicine

## 2014-01-21 VITALS — BP 114/70 | HR 88 | Temp 98.4°F | Wt 162.0 lb

## 2014-01-21 DIAGNOSIS — G2581 Restless legs syndrome: Secondary | ICD-10-CM

## 2014-01-21 DIAGNOSIS — N951 Menopausal and female climacteric states: Secondary | ICD-10-CM

## 2014-01-21 MED ORDER — GABAPENTIN 300 MG PO CAPS: 300.0000 mg | ORAL_CAPSULE | Freq: Three times a day (TID) | ORAL | Status: DC

## 2014-01-21 MED ORDER — CITALOPRAM HYDROBROMIDE 20 MG PO TABS: 30.0000 mg | ORAL_TABLET | Freq: Every day | ORAL | Status: DC

## 2014-01-21 MED ORDER — LORAZEPAM 0.5 MG PO TABS: 0.5000 mg | ORAL_TABLET | Freq: Three times a day (TID) | ORAL | Status: DC | PRN

## 2014-01-21 NOTE — Progress Notes (Signed)
Patient ID: [name], female   DOB: Aug 12, 1956, 58 y.o.   MRN: [id number] [facility] Bernadene Bell, MD Phone: [phone]  Subjective:  [name] is a 58 y.o F who presents for f/up on right leg pain and assc insomnia   # right leg restlessness -currently ongoing, improved on days that she takes 3 gabapentin -no sx during the day, only at night when tries to lay down and mostly on right side -not assc with level of activity during the day time -no numbness or dullness in foot while walking -leg pain contributing to insomnia, currently wakes her up at nighttime  #insomnia -has had improvement since was started on celexa and ativan -was told she takes these medication for post menopausal hot flashes -is able to fall asleep okay but wakes up in the night with leg discomfort -feels very tired bc is currently also having to take care of her aunt  All systems were reviewed and were negative unless otherwise noted in the HPI  Past Medical History Patient Active Problem List   Diagnosis Date Noted  . Itching 01/02/2014  . Anxiety disorder, unspecified anxiety disorder type 12/12/2013  . Depression 07/19/2013  . Distal radius fracture, right 10/20/2012  . Hordeolum 10/03/2012  . Intermittent low back pain 03/30/2012  . Swelling of finger, left 03/30/2012  . Heavy smoker (more than 20 cigarettes per day) 03/30/2012  . Health maintenance examination 03/30/2012  . Hot flashes, menopausal 02/28/2012  . Anxiety 02/28/2012  . Restless leg syndrome 02/28/2012  . Metatarsalgia of both feet 02/28/2012   Reviewed problem list.  Medications- reviewed and updated Chief complaint-noted No additions to family history Social history- patient is a current smoker  Objective: BP 114/70  Pulse 88  Temp(Src) 98.4 F (36.9 C) (Oral)  Wt 162 lb (73.483 kg) Gen: NAD, alert, cooperative with exam Ext: No edema, warm, normal tone, moves UE/LE spontaneously Neuro:  Alert and oriented, No gross deficits Skin: no rashes no lesions  Assessment/Plan: See problem based a/p

## 2014-01-21 NOTE — Assessment & Plan Note (Signed)
No neurologic deficits. Only happens during the day making claudication less likely Pt amenable to increasing both celexa and gabapentin at this time -celexa 30mg  and gabapentin 300mg  TID -considering abi at next visit given concerns of trace Dp and Pt pulses -?? Ropinerole??

## 2014-01-21 NOTE — Patient Instructions (Signed)
Ms Schenk it was great to see you today!  I am sorry to hear that you are still feeling significant discomfort with your legs. Lets work on changes with your medications to get better relief Increase the gabapentin 300mg  three times a day no matter what Continue with the ativan daily as you need it Please also increase your celexa to 1.5pills daily  Call us back for an appointment in the next few weeks to let us know if things are getting better Looking forward to seeing you soon Bernadene Bell, MD

## 2014-01-21 NOTE — Assessment & Plan Note (Signed)
Still bothersome at this time Will plan to increase celexa to 30mg  daily Currently also taking ativan prn daily (ran out in >32month with 30pill rx) Pt reviewed risks and benefits  Knows red flags to call office  rec reviewing anxiety questionaire at next visit

## 2014-02-07 ENCOUNTER — Ambulatory Visit (INDEPENDENT_AMBULATORY_CARE_PROVIDER_SITE_OTHER): Payer: PRIVATE HEALTH INSURANCE | Admitting: Family Medicine

## 2014-02-07 ENCOUNTER — Encounter: Payer: Self-pay | Admitting: Family Medicine

## 2014-02-07 VITALS — BP 128/85 | HR 94 | Temp 98.1°F | Wt 159.0 lb

## 2014-02-07 DIAGNOSIS — B372 Candidiasis of skin and nail: Secondary | ICD-10-CM

## 2014-02-07 DIAGNOSIS — B009 Herpesviral infection, unspecified: Secondary | ICD-10-CM | POA: Insufficient documentation

## 2014-02-07 MED ORDER — VALACYCLOVIR HCL 1 G PO TABS: 1000.0000 mg | ORAL_TABLET | Freq: Two times a day (BID) | ORAL | Status: DC

## 2014-02-07 MED ORDER — DIPHENHYDRAMINE HCL 25 MG PO CAPS: 25.0000 mg | ORAL_CAPSULE | Freq: Three times a day (TID) | ORAL | Status: DC | PRN

## 2014-02-07 MED ORDER — LIDOCAINE 5 % EX OINT: 1.0000 "application " | TOPICAL_OINTMENT | Freq: Four times a day (QID) | CUTANEOUS | Status: DC | PRN

## 2014-02-07 MED ORDER — CLOTRIMAZOLE 1 % EX CREA: 1.0000 "application " | TOPICAL_CREAM | Freq: Two times a day (BID) | CUTANEOUS | Status: DC

## 2014-02-07 NOTE — Assessment & Plan Note (Addendum)
A: herpetiform rash suspect HSV, not a dermatomal distribution so not VZV, not a drug eruption bc no recent Abx or new meds.  P: HSV titers Valtrex HIV, RPR lidocaine for pain Benadryl for itching Close f/u with PCP

## 2014-02-07 NOTE — Progress Notes (Signed)
   Subjective:    Patient ID: [name], female    DOB: May 11, 1956, 58 y.o.   MRN: [id number] CC: skin rashes, swollen lymph nodes in neck  HPI 58 yo F presents with a complaint of painful skin rashes:  1. Skin rashes: stated on 01/30/14 with perianal and inguinal rash. Rash is pruritic and painful. Perhaps started as blisters. Now erythematous. Spreading. Involved areas are pubic, low back,  Lower abdomen, upper thigh, mid back, under both breast, and palms. No associated fever, chills, cold symptoms, GI upset. No history of genital herpes, no new medications or foods. No contacts with rash. Sexually active with her husband only. Has history of cold sores. Today she noticed painful, deep seated pustules on her soles with erythema along lateral palm.   Soc hx: non smoker  Review of Systems As per HPI     Objective:   Physical Exam BP 128/85  Pulse 94  Temp(Src) 98.1 F (36.7 C) (Oral)  Wt 159 lb (72.122 kg) General appearance: alert, cooperative and no distress Eyes: conjunctivae/corneas clear. PERRL, EOM's intact.  Ears: normal TM's and external ear canals both ears Nose: Nares normal. Septum midline. Mucosa normal. No drainage or sinus tenderness. Throat: lips, mucosa, and tongue normal; teeth and gums normal Neck: moderate anterior cervical adenopathy Lungs: clear to auscultation bilaterally Heart: regular rate and rhythm, S1, S2 normal, no murmur, click, rub or gallop Skin:  Erythematous papules and small scattered pustules on mid to low back. Gluteal area with confluent erythema in gluteal cleft anteriorly to labia.  Pustule on upper thigh and lower abdomen. Deep seated vesicles on both palms with non blanching erythema on both lateral palms along 5th metacarpal.  Soles: no lesions.  Lymph nodes: no supraclavicular, axillary or inguinal adenopathy         Assessment & Plan:

## 2014-02-07 NOTE — Assessment & Plan Note (Signed)
A: suspect IC under breast P: lotrimin

## 2014-02-07 NOTE — Patient Instructions (Signed)
[name],  Thank you for coming in today. I am most concerned about herpes. I would like you to start valtrex 1000 mg twice daily for 10 days while we work you up.  I will be in touch with blood work results.   Schedule f/u for next week with Dr. Skeet Simmer preferably or myself.   Dr. Adrian Blackwater

## 2014-02-08 LAB — HIV ANTIBODY (ROUTINE TESTING W REFLEX): HIV: NONREACTIVE

## 2014-02-08 LAB — RPR

## 2014-02-10 ENCOUNTER — Telehealth: Payer: Self-pay | Admitting: Family Medicine

## 2014-02-10 DIAGNOSIS — B009 Herpesviral infection, unspecified: Secondary | ICD-10-CM

## 2014-02-10 LAB — HSV(HERPES SMPLX)ABS-I+II(IGG+IGM)-BLD
HSV 1 Glycoprotein G Ab, IgG: 8.06 IV — ABNORMAL HIGH
HSV 2 Glycoprotein G Ab, IgG: 6.99 IV — ABNORMAL HIGH
Herpes Simplex Vrs I&II-IgM Ab (EIA): 0.56 INDEX

## 2014-02-10 MED ORDER — LIDOCAINE 5 % EX OINT
1.0000 | TOPICAL_OINTMENT | Freq: Four times a day (QID) | CUTANEOUS | Status: DC | PRN
Start: 2014-02-10 — End: 2015-02-10

## 2014-02-10 NOTE — Telephone Encounter (Signed)
Patient called.  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  Hand rash is worse, legs still red.  Behind is still itchy.  Taking valtrex and using lidocaine gel. Almost out of gel.  From my end I do believe this is a viral rash given appearance. Very concerned for secondary bacterial infection Advised f/u evaluation either today or tomorrow. Patient did not schedule f/u at the end of last visit as instructed.   Continue valtrex At f/u assess for possible secondary bacterial infection Assess pain, patient my need oral pain medication.  Could this be some form of allergy?? I don't think so.

## 2014-02-10 NOTE — Telephone Encounter (Signed)
Pt called and would like her test results. jw

## 2014-02-14 ENCOUNTER — Ambulatory Visit: Payer: PRIVATE HEALTH INSURANCE | Admitting: Family Medicine

## 2014-02-17 ENCOUNTER — Inpatient Hospital Stay (HOSPITAL_COMMUNITY)
Admission: RE | Admit: 2014-02-17 | Discharge: 2014-02-17 | Disposition: A | Discharge status: Home / Self Care | Payer: PRIVATE HEALTH INSURANCE | Source: Ambulatory Visit

## 2014-02-17 NOTE — Progress Notes (Signed)
Pt did not show for scheduled PAT appointment. Pt contacted at home number and pt stated that her surgery is canceled. Will follow up with surgeon.

## 2014-02-18 NOTE — Progress Notes (Signed)
Spoke to White at Dr. Angus Palms office regarding pt scheduled surgery. According to Judeen Hammans," pt is to remain on surgery schedule per MD."  Pt is scheduled to see Dr.Ortmann of Friday. Again, pt did not show for scheduled PAT on 02/17/14.

## 2014-02-27 ENCOUNTER — Ambulatory Visit (HOSPITAL_COMMUNITY)
Admission: RE | Admit: 2014-02-27 | Payer: PRIVATE HEALTH INSURANCE | Source: Ambulatory Visit | Admitting: Orthopedic Surgery

## 2014-02-27 ENCOUNTER — Encounter (HOSPITAL_COMMUNITY): Admission: RE | Payer: Self-pay | Source: Ambulatory Visit

## 2014-02-27 SURGERY — OPEN REDUCTION INTERNAL FIXATION (ORIF) DISTAL RADIUS FRACTURE
Anesthesia: General | Site: Wrist | Laterality: Right

## 2014-03-26 ENCOUNTER — Telehealth: Payer: Self-pay | Admitting: Family Medicine

## 2014-03-26 DIAGNOSIS — R21 Rash and other nonspecific skin eruption: Secondary | ICD-10-CM

## 2014-03-26 NOTE — Telephone Encounter (Signed)
Patient seen for rash in May. Rash did not get any better so she went to an urgent care and was referred to [facility]. Patient will need an referral from [place] due to her having Medicaid. Patient has a scheduled appt with them on 7/24 at 10:50 am. Please advise.

## 2014-04-01 NOTE — Telephone Encounter (Signed)
Had front office check card.  Unfortunately, we are not on patients card so we will be unable to process referral at this time.    LMOVM for pt to return call.  Please inform that she will need to call and get the name on her card changed.  This change will not take place until early August (at the very least).  She will need to give [place] a call when she receives the new card with our practice on it, so we can process referral. Luisfelipe Engelstad, [name]

## 2014-04-04 NOTE — Telephone Encounter (Signed)
Pt called back and spoke with [name].  Pt was confused by medicaid stating that "her husbands name was on the card".  Attempted to call patient back but no answer.  Also called [facility] and informed of the above.  If pt is only filing [place] then no referral would be needed. [name], [name]

## 2014-06-24 ENCOUNTER — Encounter (HOSPITAL_COMMUNITY): Payer: Self-pay | Admitting: Pharmacy Technician

## 2014-06-30 ENCOUNTER — Encounter (HOSPITAL_COMMUNITY)
Admission: RE | Admit: 2014-06-30 | Discharge: 2014-06-30 | Disposition: A | Discharge status: Home / Self Care | Payer: PRIVATE HEALTH INSURANCE | Source: Ambulatory Visit | Attending: Orthopedic Surgery | Admitting: Orthopedic Surgery

## 2014-06-30 ENCOUNTER — Encounter (HOSPITAL_COMMUNITY): Payer: Self-pay

## 2014-06-30 DIAGNOSIS — Z01812 Encounter for preprocedural laboratory examination: Secondary | ICD-10-CM | POA: Insufficient documentation

## 2014-06-30 HISTORY — DX: Shortness of breath: R06.02

## 2014-06-30 LAB — CBC
HEMATOCRIT: 40.1 % (ref 36.0–46.0)
HEMOGLOBIN: 13.4 g/dL (ref 12.0–15.0)
MCH: 28.3 pg (ref 26.0–34.0)
MCHC: 33.4 g/dL (ref 30.0–36.0)
MCV: 84.8 fL (ref 78.0–100.0)
Platelets: 376 10*3/uL (ref 150–400)
RBC: 4.73 MIL/uL (ref 3.87–5.11)
RDW: 13.6 % (ref 11.5–15.5)
WBC: 9.2 10*3/uL (ref 4.0–10.5)

## 2014-06-30 NOTE — Pre-Procedure Instructions (Addendum)
[name]  06/30/2014   Your procedure is scheduled on Wednesday, October 21  Report to [facility] Admitting at 9:45 AM.  Call this number if you have problems the morning of surgery: [phone]   Remember:   Do not eat food or drink liquids after midnight Tuesday, October 20.   Take these medicines the morning of surgery with A SIP OF WATER: citalopram (CELEXA),gabapentin (NEURONTIN).               Take if needed:HYDROcodone-acetaminophen (NORCO), LORazepam (ATIVAN)      Do not wear jewelry, make-up or nail polish.  Do not wear lotions, powders, or perfume.  Do not shave 48 hours prior to surgery.   Do not bring valuables to the hospital.              [facility] is not responsible for any belongings or valuables.               Contacts, dentures or bridgework may not be worn into surgery.  Leave suitcase in the car. After surgery it may be brought to your room.  For patients admitted to the hospital, discharge time is determined by your treatment team.               Patients discharged the day of surgery will not be allowed to drive home.  Name and phone number of your driver: -   Special Instructions: Review  [place] - Preparing For Surgery.   Please read over the following fact sheets that you were given: Pain Booklet, Coughing and Deep Breathing and Surgical Site Infection Prevention

## 2014-07-14 ENCOUNTER — Encounter (HOSPITAL_COMMUNITY): Payer: Self-pay

## 2014-07-16 ENCOUNTER — Ambulatory Visit: Payer: PRIVATE HEALTH INSURANCE

## 2014-07-17 ENCOUNTER — Ambulatory Visit (INDEPENDENT_AMBULATORY_CARE_PROVIDER_SITE_OTHER): Payer: PRIVATE HEALTH INSURANCE | Admitting: *Deleted

## 2014-07-17 DIAGNOSIS — Z23 Encounter for immunization: Secondary | ICD-10-CM

## 2014-07-18 ENCOUNTER — Encounter (HOSPITAL_COMMUNITY): Payer: Self-pay | Admitting: *Deleted

## 2014-07-21 ENCOUNTER — Ambulatory Visit (HOSPITAL_COMMUNITY)
Admission: RE | Admit: 2014-07-21 | Discharge: 2014-07-21 | Disposition: A | Discharge status: Home / Self Care | Payer: PRIVATE HEALTH INSURANCE | Source: Ambulatory Visit | Attending: Orthopedic Surgery | Admitting: Orthopedic Surgery

## 2014-07-21 ENCOUNTER — Ambulatory Visit (HOSPITAL_COMMUNITY): Payer: PRIVATE HEALTH INSURANCE | Admitting: Certified Registered Nurse Anesthetist

## 2014-07-21 ENCOUNTER — Encounter (HOSPITAL_COMMUNITY): Payer: Self-pay | Admitting: *Deleted

## 2014-07-21 ENCOUNTER — Encounter (HOSPITAL_COMMUNITY)
Admission: RE | Disposition: A | Discharge status: Home / Self Care | Payer: Self-pay | Source: Ambulatory Visit | Attending: Orthopedic Surgery

## 2014-07-21 DIAGNOSIS — F329 Major depressive disorder, single episode, unspecified: Secondary | ICD-10-CM | POA: Insufficient documentation

## 2014-07-21 DIAGNOSIS — F419 Anxiety disorder, unspecified: Secondary | ICD-10-CM | POA: Insufficient documentation

## 2014-07-21 DIAGNOSIS — Z79899 Other long term (current) drug therapy: Secondary | ICD-10-CM | POA: Insufficient documentation

## 2014-07-21 DIAGNOSIS — M24631 Ankylosis, right wrist: Secondary | ICD-10-CM | POA: Diagnosis present

## 2014-07-21 DIAGNOSIS — F17210 Nicotine dependence, cigarettes, uncomplicated: Secondary | ICD-10-CM | POA: Insufficient documentation

## 2014-07-21 DIAGNOSIS — G2581 Restless legs syndrome: Secondary | ICD-10-CM | POA: Insufficient documentation

## 2014-07-21 DIAGNOSIS — Z885 Allergy status to narcotic agent status: Secondary | ICD-10-CM | POA: Diagnosis not present

## 2014-07-21 DIAGNOSIS — M24641 Ankylosis, right hand: Secondary | ICD-10-CM | POA: Insufficient documentation

## 2014-07-21 HISTORY — PX: WRIST ARTHROSCOPY WITH DEBRIDEMENT: SHX6194

## 2014-07-21 SURGERY — WRIST ARTHROSCOPY WITH DEBRIDEMENT
Anesthesia: Regional | Site: Wrist | Laterality: Right

## 2014-07-21 MED ORDER — FENTANYL CITRATE 0.05 MG/ML IJ SOLN
INTRAMUSCULAR | Status: AC
  Filled 2014-07-21: qty 5

## 2014-07-21 MED ORDER — CEFAZOLIN SODIUM-DEXTROSE 2-3 GM-% IV SOLR
2.0000 g | INTRAVENOUS | Status: AC
  Administered 2014-07-21: 2 g via INTRAVENOUS
  Filled 2014-07-21: qty 50

## 2014-07-21 MED ORDER — DOCUSATE SODIUM 100 MG PO CAPS: 100.0000 mg | ORAL_CAPSULE | Freq: Two times a day (BID) | ORAL | Status: DC

## 2014-07-21 MED ORDER — PHENYLEPHRINE HCL 10 MG/ML IJ SOLN
10.0000 mg | INTRAVENOUS | Status: DC | PRN
  Administered 2014-07-21: 20 ug/min via INTRAVENOUS

## 2014-07-21 MED ORDER — VITAMIN C 500 MG PO TABS: 500.0000 mg | ORAL_TABLET | Freq: Every day | ORAL | Status: DC

## 2014-07-21 MED ORDER — MIDAZOLAM HCL 2 MG/2ML IJ SOLN
INTRAMUSCULAR | Status: AC
  Filled 2014-07-21: qty 2

## 2014-07-21 MED ORDER — ACETAMINOPHEN 160 MG/5ML PO SOLN: 325.0000 mg | ORAL | Status: DC | PRN

## 2014-07-21 MED ORDER — ONDANSETRON HCL 4 MG/2ML IJ SOLN
INTRAMUSCULAR | Status: DC | PRN
  Administered 2014-07-21: 4 mg via INTRAVENOUS

## 2014-07-21 MED ORDER — OXYCODONE HCL 5 MG/5ML PO SOLN: 5.0000 mg | Freq: Once | ORAL | Status: DC | PRN

## 2014-07-21 MED ORDER — SODIUM CHLORIDE 0.9 % IR SOLN
Status: DC | PRN
  Administered 2014-07-21: 1000 mL

## 2014-07-21 MED ORDER — PROPOFOL 10 MG/ML IV EMUL
INTRAVENOUS | Status: AC
  Filled 2014-07-21: qty 100

## 2014-07-21 MED ORDER — MIDAZOLAM HCL 5 MG/ML IJ SOLN: 2.0000 mg | Freq: Once | INTRAMUSCULAR | Status: AC

## 2014-07-21 MED ORDER — FENTANYL CITRATE 0.05 MG/ML IJ SOLN
INTRAMUSCULAR | Status: AC
  Administered 2014-07-21: 50 ug via INTRAVENOUS
  Filled 2014-07-21: qty 2

## 2014-07-21 MED ORDER — LIDOCAINE HCL (CARDIAC) 20 MG/ML IV SOLN
INTRAVENOUS | Status: DC | PRN
  Administered 2014-07-21: 50 mg via INTRAVENOUS

## 2014-07-21 MED ORDER — HYDROMORPHONE HCL 1 MG/ML IJ SOLN: 0.2500 mg | INTRAMUSCULAR | Status: DC | PRN

## 2014-07-21 MED ORDER — LACTATED RINGERS IV SOLN
Freq: Once | INTRAVENOUS | Status: AC
  Administered 2014-07-21: 15:00:00 via INTRAVENOUS

## 2014-07-21 MED ORDER — FENTANYL CITRATE 0.05 MG/ML IJ SOLN
50.0000 ug | Freq: Once | INTRAMUSCULAR | Status: AC
  Administered 2014-07-21: 50 ug via INTRAVENOUS

## 2014-07-21 MED ORDER — MIDAZOLAM HCL 2 MG/2ML IJ SOLN
INTRAMUSCULAR | Status: AC
Start: 2014-07-21 — End: 2014-07-21
  Administered 2014-07-21: 2 mg
  Filled 2014-07-21: qty 2

## 2014-07-21 MED ORDER — OXYCODONE-ACETAMINOPHEN 5-325 MG PO TABS: 1.0000 | ORAL_TABLET | ORAL | Status: DC | PRN

## 2014-07-21 MED ORDER — PHENYLEPHRINE HCL 10 MG/ML IJ SOLN
INTRAMUSCULAR | Status: AC
  Filled 2014-07-21: qty 2

## 2014-07-21 MED ORDER — CHLORHEXIDINE GLUCONATE 4 % EX LIQD: 60.0000 mL | Freq: Once | CUTANEOUS | Status: DC

## 2014-07-21 MED ORDER — ACETAMINOPHEN 325 MG PO TABS: 325.0000 mg | ORAL_TABLET | ORAL | Status: DC | PRN

## 2014-07-21 MED ORDER — ONDANSETRON HCL 4 MG PO TABS: 4.0000 mg | ORAL_TABLET | Freq: Three times a day (TID) | ORAL | Status: DC | PRN

## 2014-07-21 MED ORDER — ROPIVACAINE HCL 5 MG/ML IJ SOLN
INTRAMUSCULAR | Status: DC | PRN
  Administered 2014-07-21: 20 mL via PERINEURAL

## 2014-07-21 MED ORDER — MIDAZOLAM HCL 2 MG/2ML IJ SOLN
INTRAMUSCULAR | Status: AC
Start: 2014-07-21 — End: 2014-07-21
  Filled 2014-07-21: qty 2

## 2014-07-21 MED ORDER — BUPIVACAINE HCL (PF) 0.25 % IJ SOLN
INTRAMUSCULAR | Status: AC
  Filled 2014-07-21: qty 30

## 2014-07-21 MED ORDER — FENTANYL CITRATE 0.05 MG/ML IJ SOLN
INTRAMUSCULAR | Status: AC
Start: 2014-07-21 — End: 2014-07-21
  Filled 2014-07-21: qty 5

## 2014-07-21 MED ORDER — PHENYLEPHRINE HCL 10 MG/ML IJ SOLN
INTRAMUSCULAR | Status: DC | PRN
  Administered 2014-07-21 (×5): 80 ug via INTRAVENOUS

## 2014-07-21 MED ORDER — OXYCODONE HCL 5 MG PO TABS: 5.0000 mg | ORAL_TABLET | Freq: Once | ORAL | Status: DC | PRN

## 2014-07-21 MED ORDER — LACTATED RINGERS IV SOLN
INTRAVENOUS | Status: DC | PRN
  Administered 2014-07-21 (×2): via INTRAVENOUS

## 2014-07-21 MED ORDER — PROPOFOL 10 MG/ML IV BOLUS
INTRAVENOUS | Status: DC | PRN
  Administered 2014-07-21: 50 mg via INTRAVENOUS
  Administered 2014-07-21: 150 mg via INTRAVENOUS
  Administered 2014-07-21: 50 mg via INTRAVENOUS

## 2014-07-21 SURGICAL SUPPLY — 31 items
BANDAGE ELASTIC 3 VELCRO ST LF (GAUZE/BANDAGES/DRESSINGS) ×3 IMPLANT
BNDG COHESIVE 3X5 TAN STRL LF (GAUZE/BANDAGES/DRESSINGS) ×3 IMPLANT
COVER SURGICAL LIGHT HANDLE (MISCELLANEOUS) ×3 IMPLANT
CUFF TOURNIQUET SINGLE 18IN (TOURNIQUET CUFF) ×3 IMPLANT
DRAPE OEC MINIVIEW 54X84 (DRAPES) ×3 IMPLANT
DRSG ADAPTIC 3X8 NADH LF (GAUZE/BANDAGES/DRESSINGS) ×3 IMPLANT
GAUZE XEROFORM 5X9 LF (GAUZE/BANDAGES/DRESSINGS) ×3 IMPLANT
GLOVE BIOGEL PI IND STRL 8.5 (GLOVE) ×1 IMPLANT
GLOVE BIOGEL PI INDICATOR 8.5 (GLOVE) ×2
GLOVE SURG ORTHO 8.0 STRL STRW (GLOVE) ×3 IMPLANT
GOWN STRL REUS W/ TWL LRG LVL3 (GOWN DISPOSABLE) ×2 IMPLANT
GOWN STRL REUS W/ TWL XL LVL3 (GOWN DISPOSABLE) ×1 IMPLANT
GOWN STRL REUS W/TWL LRG LVL3 (GOWN DISPOSABLE) ×4
GOWN STRL REUS W/TWL XL LVL3 (GOWN DISPOSABLE) ×2
KIT BASIN OR (CUSTOM PROCEDURE TRAY) ×3 IMPLANT
KIT ROOM TURNOVER OR (KITS) ×3 IMPLANT
NEEDLE HYPO 21X1.5 SAFETY (NEEDLE) ×3 IMPLANT
NEEDLE HYPO 25GX1X1/2 BEV (NEEDLE) ×3 IMPLANT
NS IRRIG 1000ML POUR BTL (IV SOLUTION) ×3 IMPLANT
PACK ORTHO EXTREMITY (CUSTOM PROCEDURE TRAY) ×3 IMPLANT
PAD ARMBOARD 7.5X6 YLW CONV (MISCELLANEOUS) ×6 IMPLANT
PAD CAST 3X4 CTTN HI CHSV (CAST SUPPLIES) ×1 IMPLANT
PADDING CAST COTTON 3X4 STRL (CAST SUPPLIES) ×2
SOAP 2 % CHG 4 OZ (WOUND CARE) ×3 IMPLANT
SPONGE GAUZE 4X4 12PLY STER LF (GAUZE/BANDAGES/DRESSINGS) ×3 IMPLANT
SUT ETHILON 4 0 PS 2 18 (SUTURE) ×3 IMPLANT
TOWEL OR 17X24 6PK STRL BLUE (TOWEL DISPOSABLE) ×3 IMPLANT
TOWEL OR 17X26 10 PK STRL BLUE (TOWEL DISPOSABLE) ×3 IMPLANT
TUBE CONNECTING 12'X1/4 (SUCTIONS) ×1
TUBE CONNECTING 12X1/4 (SUCTIONS) ×2 IMPLANT
UNDERPAD 30X30 INCONTINENT (UNDERPADS AND DIAPERS) ×3 IMPLANT

## 2014-07-21 NOTE — Anesthesia Postprocedure Evaluation (Signed)
Anesthesia Post Note  Patient: [name]  Procedure(s) Performed: Procedure(s) (LRB): RIGHT WRIST DISTAL RADIAL ULNAR JOINT DEBRIDEMENT AND JOINT RELEASE POSSIBLE TENDON INTERPOSITION (Right)  Anesthesia type: General  Patient location: PACU  Post pain: Pain level controlled and Adequate analgesia  Post assessment: Post-op Vital signs reviewed, Patient's Cardiovascular Status Stable, Respiratory Function Stable, Patent Airway and Pain level controlled  Last Vitals:  Filed Vitals:   07/21/14 1800  BP:   Pulse: 91  Temp:   Resp: 25    Post vital signs: Reviewed and stable  Level of consciousness: awake, alert  and oriented  Complications: No apparent anesthesia complications

## 2014-07-21 NOTE — Anesthesia Procedure Notes (Addendum)
Anesthesia Regional Block:  Supraclavicular block  Pre-Anesthetic Checklist: ,, timeout performed, Correct Patient, Correct Site, Correct Laterality, Correct Procedure, Correct Position, site marked, Risks and benefits discussed,  Surgical consent,  Pre-op evaluation,  At surgeon's request and post-op pain management  Laterality: Upper and Right  Prep: chloraprep       Needles:  Injection technique: Single-shot  Needle Type: Echogenic Needle          Additional Needles:  Procedures: ultrasound guided (picture in chart) Supraclavicular block Narrative:  Start time: 07/21/2014 3:41 PM End time: 07/21/2014 3:49 PM Injection made incrementally with aspirations every 5 mL.  Performed by: Personally   Additional Notes: H+P and labs reviewed, risks and benefits discussed with patient, procedure tolerated well without complications   Procedure Name: LMA Insertion Date/Time: 07/21/2014 4:13 PM Performed by: Blair Heys E Pre-anesthesia Checklist: Patient identified, Emergency Drugs available, Suction available and Patient being monitored Patient Re-evaluated:Patient Re-evaluated prior to inductionOxygen Delivery Method: Circle system utilized Preoxygenation: Pre-oxygenation with 100% oxygen Intubation Type: IV induction Ventilation: Mask ventilation without difficulty LMA: LMA inserted LMA Size: 4.0 Number of attempts: 1 Placement Confirmation: positive ETCO2 and breath sounds checked- equal and bilateral Tube secured with: Tape Dental Injury: Teeth and Oropharynx as per pre-operative assessment

## 2014-07-21 NOTE — Transfer of Care (Signed)
Immediate Anesthesia Transfer of Care Note  Patient: [name]  Procedure(s) Performed: Procedure(s): RIGHT WRIST DISTAL RADIAL ULNAR JOINT DEBRIDEMENT AND JOINT RELEASE POSSIBLE TENDON INTERPOSITION (Right)  Patient Location: PACU  Anesthesia Type:General and Regional  Level of Consciousness: awake and alert   Airway & Oxygen Therapy: Patient Spontanous Breathing and Patient connected to nasal cannula oxygen  Post-op Assessment: Report given to PACU RN, Post -op Vital signs reviewed and stable and Patient moving all extremities X 4  Post vital signs: Reviewed and stable  Complications: No apparent anesthesia complications

## 2014-07-21 NOTE — Anesthesia Preprocedure Evaluation (Addendum)
Anesthesia Evaluation  Patient identified by MRN, date of birth, ID band Patient awake    Reviewed: Allergy & Precautions, H&P , NPO status , Patient's Chart, lab work & pertinent test results  History of Anesthesia Complications (+) history of anesthetic complications  Airway Mallampati: II  TM Distance: >3 FB Neck ROM: Full    Dental  (+) Dental Advisory Given   Pulmonary shortness of breath and with exertion, Current Smoker,  breath sounds clear to auscultation        Cardiovascular negative cardio ROS  Rhythm:Regular     Neuro/Psych PSYCHIATRIC DISORDERS Anxiety Depression negative neurological ROS     GI/Hepatic negative GI ROS, Neg liver ROS,   Endo/Other  negative endocrine ROS  Renal/GU negative Renal ROS     Musculoskeletal  (+) Arthritis -,   Abdominal   Peds  Hematology negative hematology ROS (+)   Anesthesia Other Findings   Reproductive/Obstetrics                            Anesthesia Physical Anesthesia Plan  ASA: II  Anesthesia Plan: General and Regional   Post-op Pain Management:    Induction: Intravenous  Airway Management Planned: LMA  Additional Equipment:   Intra-op Plan:   Post-operative Plan: Extubation in OR  Informed Consent: I have reviewed the patients History and Physical, chart, labs and discussed the procedure including the risks, benefits and alternatives for the proposed anesthesia with the patient or authorized representative who has indicated his/her understanding and acceptance.   Dental advisory given  Plan Discussed with: CRNA, Anesthesiologist and Surgeon  Anesthesia Plan Comments:         Anesthesia Quick Evaluation

## 2014-07-21 NOTE — H&P (Signed)
[name] is an 58 y.o. female.   Chief Complaint: Right wrist stiffness and lack of forearm rotation HPI: Pt followed in office for > 18 months Pt with concerned about her forearm rotation and lack of digital motion Pt here for surgery   Past Medical History  Diagnosis Date  . Anxiety 2009  . Arthritis 2000  . Fibroid 2003  . Hot flashes, menopausal 2012  . Allergy     seasonal  . RLS (restless legs syndrome)   . Wears glasses   . Depression   . Shortness of breath     with exertion    Past Surgical History  Procedure Laterality Date  . Neck surgery  2003    Cervical vertebroplasty   . Colonoscopy    . Open reduction internal fixation (orif) distal radial fracture Right 11/08/2012    Procedure: OPEN REDUCTION INTERNAL FIXATION (ORIF) RIGHT DISTAL RADIUS FRACTURE;  Surgeon: Linna Hoff, MD;  Location: [place];  Service: Orthopedics;  Laterality: Right;  . Hardware removal Right 07/24/2013    Procedure: RIGHT WRIST HARDWARE REMOVAL, JOINT RELEASE, RIGHT HAND MANIPULATION UNDER ANESTHESIA;  Surgeon: Linna Hoff, MD;  Location: [place];  Service: Orthopedics;  Laterality: Right;    Family History  Problem Relation Age of Onset  . Hypertension Mother   . Hyperlipidemia Mother   . Early death Sister 61    Drug overdose  . Dementia Father   . Cancer Father     Throat cancer   . Hypertension Daughter    Social History:  reports that she has been smoking Cigarettes.  She has a 41 pack-year smoking history. She has never used smokeless tobacco. She reports that she drinks alcohol. She reports that she does not use illicit drugs.  Allergies:  Allergies  Allergen Reactions  . Codeine Nausea And Vomiting and Other (See Comments)    Hallucinations    Medications Prior to Admission  Medication Sig Dispense Refill  . citalopram (CELEXA) 20 MG tablet Take 1.5 tablets (30 mg total) by mouth daily. 50 tablet 3  . clobetasol cream  (TEMOVATE) 3.53 % Apply 1 application topically 2 (two) times daily as needed.    . diphenhydrAMINE (BENADRYL) 25 mg capsule Take 1 capsule (25 mg total) by mouth every 8 (eight) hours as needed for itching or sleep. 30 capsule 0  . gabapentin (NEURONTIN) 300 MG capsule Take 1 capsule (300 mg total) by mouth 3 (three) times daily. Start with 1 capsule for three days and then increase to two daily 180 capsule 3  . HYDROcodone-acetaminophen (NORCO) 5-325 MG per tablet Take 1 tablet by mouth every 4 (four) hours as needed for pain. 50 tablet 0  . hydrOXYzine (ATARAX/VISTARIL) 10 MG tablet Take 10 mg by mouth at bedtime as needed.    . hydrOXYzine (VISTARIL) 50 MG capsule Take 100 mg by mouth at bedtime as needed.    Marland Kitchen ibuprofen (ADVIL,MOTRIN) 800 MG tablet Take 800 mg by mouth every 6 (six) hours as needed.    Marland Kitchen LORazepam (ATIVAN) 0.5 MG tablet Take 1 tablet (0.5 mg total) by mouth every 8 (eight) hours as needed for anxiety. 30 tablet 0  . clotrimazole (LOTRIMIN) 1 % cream Apply 1 application topically 2 (two) times daily. (Patient not taking: Reported on 07/21/2014) 30 g 0  . lidocaine (XYLOCAINE) 5 % ointment Apply 1 application topically 4 (four) times daily as needed. 35.44 g 1    No results found  for this or any previous visit (from the past 48 hour(s)). No results found.  ROS PT BEING TREATED FOR PSORIASIS  Blood pressure 169/84, pulse 83, temperature 97.6 F (36.4 C), temperature source Oral, resp. rate 20, weight 73.483 kg (162 lb), SpO2 98 %. Physical Exam  General Appearance:  Alert, cooperative, no distress, appears stated age  Head:  Normocephalic, without obvious abnormality, atraumatic  Eyes:  Pupils equal, conjunctiva/corneas clear,         Throat: Lips, mucosa, and tongue normal; teeth and gums normal  Neck: No visible masses     Lungs:   respirations unlabored  Chest Wall:  No tenderness or deformity  Heart:  Regular rate and rhythm,  Abdomen:   Soft, non-tender,          Extremities: RIGHT WRIST VOLAR INCISION WELL HEALED, LACK OF FOREARM ROTATION NEAR ANKYLOSIS FINGERS WARM WELL PERFUSED UNABLE TO MAKE FIST ABLE TO EXTEND THUMB  Pulses: 2+ and symmetric  Skin: Skin color, texture, turgor normal, no rashes or lesions     Neurologic: Normal    Assessment/Plan RIGHT WRIST DISTAL-RADIOULNAR JOINT CONTRACTURE  RIGHT WRIST DRUJ VOLAR CAPSULAR JOINT RELEASE AND OR HEMI RESECTION AND TENDON INTERPOSITION  WE ARE PLANNING SURGERY FOR YOUR UPPER EXTREMITY. THE RISKS AND BENEFITS OF SURGERY INCLUDE BUT NOT LIMITED TO BLEEDING INFECTION, DAMAGE TO NEARBY NERVES ARTERIES TENDONS, FAILURE OF SURGERY TO ACCOMPLISH ITS INTENDED GOALS, PERSISTENT SYMPTOMS AND NEED FOR FURTHER SURGICAL INTERVENTION. WITH THIS IN MIND WE WILL PROCEED. I HAVE DISCUSSED WITH THE PATIENT THE PRE AND POSTOPERATIVE REGIMEN AND THE DOS AND DON'TS. PT VOICED UNDERSTANDING AND INFORMED CONSENT SIGNED.  R/B/A DISCUSSED WITH PT IN OFFICE.  PT VOICED UNDERSTANDING OF PLAN CONSENT SIGNED DAY OF SURGERY PT SEEN AND EXAMINED PRIOR TO OPERATIVE PROCEDURE/DAY OF SURGERY SITE MARKED. QUESTIONS ANSWERED WILL GO HOME FOLLOWING SURGERY  Linna Hoff 07/21/2014, 3:11 PM

## 2014-07-21 NOTE — Discharge Instructions (Signed)
What to eat:  For your first meals, you should eat lightly; only small meals initially.  If you do not have nausea, you may eat larger meals.  Avoid spicy, greasy and heavy food.    General Anesthesia, Adult, Care After  Refer to this sheet in the next few weeks. These instructions provide you with information on caring for yourself after your procedure. Your health care provider may also give you more specific instructions. Your treatment has been planned according to current medical practices, but problems sometimes occur. Call your health care provider if you have any problems or questions after your procedure.  WHAT TO EXPECT AFTER THE PROCEDURE  After the procedure, it is typical to experience:  Sleepiness.  Nausea and vomiting. HOME CARE INSTRUCTIONS  For the first 24 hours after general anesthesia:  Have a responsible person with you.  Do not drive a car. If you are alone, do not take public transportation.  Do not drink alcohol.  Do not take medicine that has not been prescribed by your health care provider.  Do not sign important papers or make important decisions.  You may resume a normal diet and activities as directed by your health care provider.  Change bandages (dressings) as directed.  If you have questions or problems that seem related to general anesthesia, call the hospital and ask for the anesthetist or anesthesiologist on call. SEEK MEDICAL CARE IF:  You have nausea and vomiting that continue the day after anesthesia.  You develop a rash. SEEK IMMEDIATE MEDICAL CARE IF:  You have difficulty breathing.  You have chest pain.  You have any allergic problems. Document Released: 12/05/2000 Document Revised: 05/01/2013 Document Reviewed: 03/14/2013  ExitCare Patient Information 2014 [place] [facility] THE [facility] ON [address] IN [place].

## 2014-07-21 NOTE — Brief Op Note (Signed)
07/21/2014  3:11 PM  PATIENT:  [name]  58 y.o. female  PRE-OPERATIVE DIAGNOSIS:  right wrist distal radial ulnar joint arthritits  POST-OPERATIVE DIAGNOSIS:  same  PROCEDURE:  Procedure(s): RIGHT WRIST DISTAL RADIAL ULNAR JOINT DEBRIDEMENT AND JOINT RELEASE POSSIBLE TENDON INTERPOSITION (Right)  SURGEON:  Surgeon(s) and Role:    * Linna Hoff, MD - Primary  PHYSICIAN ASSISTANT:   ASSISTANTS: [place] PAC   ANESTHESIA:   general  EBL:     BLOOD ADMINISTERED:none  DRAINS: none   LOCAL MEDICATIONS USED:  NONE  SPECIMEN:  No Specimen  DISPOSITION OF SPECIMEN:  N/A  COUNTS:  YES  TOURNIQUET:    DICTATION: .Other Dictation: Dictation Number dictated  PLAN OF CARE: Discharge to home after PACU  PATIENT DISPOSITION:  PACU - hemodynamically stable.   Delay start of Pharmacological VTE agent (>24hrs) due to surgical blood loss or risk of bleeding: not applicable

## 2014-07-22 ENCOUNTER — Encounter (HOSPITAL_COMMUNITY): Payer: Self-pay | Admitting: Orthopedic Surgery

## 2014-07-22 NOTE — Op Note (Signed)
NAMEARGIE, LOBER                ACCOUNT NO.:  [id number]  MEDICAL RECORD NO.:  [id number]  LOCATION:  [facility]                         FACILITY:  [facility]  PHYSICIAN:  Melrose Nakayama, MD  DATE OF BIRTH:  04/06/1956  DATE OF PROCEDURE:  07/21/2014 DATE OF DISCHARGE:  07/21/2014                              OPERATIVE REPORT   PREOPERATIVE DIAGNOSES: 1. Right wrist distal radioulnar joint arthrofibrosis and joint     significant forearm rotation limitations. 2. Right flexor pollicis longus tendon adhesions. 3. Right index, long, ring, and small finger joint stiffness with     arthrofibrosis of the metacarpophalangeal and interphalangeal     joints.  POSTOPERATIVE DIAGNOSES: 1. Right wrist distal radioulnar joint arthrofibrosis and joint     significant forearm rotation limitations. 2. Right flexor pollicis longus tendon adhesions. 3. Right index, long, ring, and small finger joint stiffness with     arthrofibrosis of the metacarpophalangeal and interphalangeal     joints.  PROCEDURE: 1. Right forearm volar capsular distal radioulnar joint release. 2. Right partial excision of the distal ulna. 3. Right flexor pollicis longus tenolysis and tendon release. 4. Right forearm flexor digitorum profundus tendon release and     tenolysis. 5. Manipulation under anesthesia and interphalangeal joints of the     index, long, ring, and small fingers. 6. Radiographs 3 views, right wrist.  SURGICAL ASSISTANT:  Judith Part. Chabon, PA-C, who scrubbed and necessary for the entire procedure who helped to aid primarily in the exposure and resection of the portion of the distal ulna.  SURGICAL INDICATIONS:  Ms. Fontenot is a right-hand-dominant female with persistent discomfort and limitations significantly with her forearm rotation digital movement.  The patient elected to undergo the above procedure.  Risks, benefits, and alternatives were discussed in detail with the patient and a signed informed  consent was obtained.  Risks of surgery to include, but not limited to bleeding, infection, damage to nearby nerves, arteries, or tendons, loss of motion to the wrist and digits, incomplete relief of symptoms, and need for further surgical intervention.  DESCRIPTION OF PROCEDURE:  The patient was properly identified in the preoperative holding area and a mark with a permanent marker made on the right wrist to indicate the correct operative site.  The patient was then brought back to the operating room, placed supine on the anesthesia room table where general anesthesia was administered.  The patient tolerated this well.  A well-padded tourniquet was placed on the right brachium and sealed with 1000 drape.  Right upper extremity was then prepped and draped in normal sterile fashion.  Time-out was called, correct side was identified, and procedure then begun.  Attention was then turned to the right wrist.  Previous incision was then made volarly.  Limb was then elevated and tourniquet insufflated.  Dissection was then carried down through the skin and subcutaneous tissue.  The FCR sheath was then opened both proximally and distally.  The FPL had marked adhesions down to the bone.  Tenolysis was then carried out of the [place]. This was then proximally and distally free in the FPL nicely.  Remaining portions of the flexor tendons were then freed  from the distal radius where they were grossly adherent into the pronator quadratus.  Tenolysis was then carried out of the FDP along the volar region.  While this was being done, joint manipulation was then carried out under anesthesia of the MP and interphalangeal joints.  Manipulation of the joint was then able to close and I was able to get the fingers and almost make a full composite fist under anesthesia.  Once this was carried out, the distal radioulnar joint was then exposed.  The patient did have the limitations in the forearm rotation.   Manual manipulation did improve the movement, however, the volar capsule was then carefully identified and resected. The volar capsule resection exposed the ulnar head using small Kerrison rongeurs.  The Kerrison was then used to resect portion of the ulna creating the hemi-resection of the distal ulna off.  The wound was then thoroughly irrigated.  Radiographs were then used to confirm the hemi- resection.  After hemi-resection, there was markedly improvement in her forearm rotation.  I was able to nearly fully get her to full supination, no instability with passive forearm rotation.  The wound was then thoroughly irrigated.  After copious wound irrigation, the subcutaneous tissues were closed with Vicryl and the skin was closed with 4-0 Vicryl Rapide.  Adaptic dressing and sterile compressive bandage were then applied.  The patient was then placed in a well-padded sugar-tong splint in full supination, extubated, and taken to the recovery room in good condition.  RADIOGRAPHIC INTERPRETATION:  AP, lateral, and oblique films of the wrist did show the resection of the distal ulna.  There was good alignment in all planes.  POSTPROCEDURAL PLAN:  The patient discharged to home, seen back in the office in approximately 11 days for wound check, suture removal, and then get her in a therapy regimen with supination and allow her some active range of motion of the forearm and be very aggressive with mobility of her fingers and her forearm.     Melrose Nakayama, MD     FWO/MEDQ  D:  07/21/2014  T:  07/22/2014  Job:  [id number]

## 2014-08-04 ENCOUNTER — Encounter: Payer: Self-pay | Admitting: Occupational Therapy

## 2014-08-04 ENCOUNTER — Ambulatory Visit: Payer: PRIVATE HEALTH INSURANCE | Attending: Orthopedic Surgery | Admitting: Occupational Therapy

## 2014-08-04 DIAGNOSIS — M79641 Pain in right hand: Secondary | ICD-10-CM | POA: Insufficient documentation

## 2014-08-04 DIAGNOSIS — M6281 Muscle weakness (generalized): Secondary | ICD-10-CM | POA: Diagnosis not present

## 2014-08-04 DIAGNOSIS — M2560 Stiffness of unspecified joint, not elsewhere classified: Secondary | ICD-10-CM | POA: Insufficient documentation

## 2014-08-04 DIAGNOSIS — R279 Unspecified lack of coordination: Secondary | ICD-10-CM | POA: Diagnosis not present

## 2014-08-04 DIAGNOSIS — Z5189 Encounter for other specified aftercare: Secondary | ICD-10-CM | POA: Diagnosis present

## 2014-08-04 NOTE — Therapy (Signed)
Occupational Therapy Evaluation  Patient Details  Name: [name] MRN: [id number] Date of Birth: Feb 15, 1956  Encounter Date: 08/04/2014      OT End of Session - 08/04/14 1305    Visit Number 1   Number of Visits 16   Date for OT Re-Evaluation 09/29/14   OT Start Time 1105   OT Stop Time 1145   OT Time Calculation (min) 40 min   Activity Tolerance Patient tolerated treatment well      Past Medical History  Diagnosis Date  . Anxiety 2009  . Arthritis 2000  . Fibroid 2003  . Hot flashes, menopausal 2012  . Allergy     seasonal  . RLS (restless legs syndrome)   . Wears glasses   . Depression   . Shortness of breath     with exertion    Past Surgical History  Procedure Laterality Date  . Neck surgery  2003    Cervical vertebroplasty   . Colonoscopy    . Open reduction internal fixation (orif) distal radial fracture Right 11/08/2012    Procedure: OPEN REDUCTION INTERNAL FIXATION (ORIF) RIGHT DISTAL RADIUS FRACTURE;  Surgeon: Linna Hoff, MD;  Location: [facility];  Service: Orthopedics;  Laterality: Right;  . Hardware removal Right 07/24/2013    Procedure: RIGHT WRIST HARDWARE REMOVAL, JOINT RELEASE, RIGHT HAND MANIPULATION UNDER ANESTHESIA;  Surgeon: Linna Hoff, MD;  Location: [facility];  Service: Orthopedics;  Laterality: Right;  . Wrist arthroscopy with debridement Right 07/21/2014    Procedure: RIGHT WRIST DISTAL RADIAL ULNAR JOINT DEBRIDEMENT AND JOINT RELEASE POSSIBLE TENDON INTERPOSITION;  Surgeon: Linna Hoff, MD;  Location: [facility];  Service: Orthopedics;  Laterality: Right;    There were no vitals taken for this visit.  Visit Diagnosis:  Lack of coordination - Plan: Ot plan of care cert/re-cert  Pain of right hand - Plan: Ot plan of care cert/re-cert  Generalized muscle weakness - Plan: Ot plan of care cert/re-cert  Decreased range of motion - Plan: Ot plan of care cert/re-cert      Subjective Assessment -  08/04/14 1110    Symptoms "Numbness and tingling in the fingers, pain  in hand with some in fingers"   Limitations Order states no limitations however strength not checked.  MD provided splint and told pt "just wear it". Patient states he did not tell her how long to wear it or when to wear it. Patient does not have it on today.   Currently in Pain? Yes   Pain Score 7    Pain Location Hand   Pain Orientation Right   Pain Descriptors / Indicators Aching   Pain Type Chronic pain   Pain Onset More than a month ago          [facility] OT Assessment - 08/04/14 1118    Assessment   Diagnosis R forearm tendon release   Onset Date 07/21/14   Prior Therapy summer 2015   Precautions   Precautions None  Need to clarify with MD regarding strengthening and brace    Precaution Comments need to clarify strengthening and brace   Required Braces or Orthoses Other Brace/Splint  need to clarify with MD wearing schedule   Home  Environment   Family/patient expects to be discharged to: Private residence   Lives With Spouse  one level house   Prior Function   Level of [place] with basic ADLs   Vocation Full time employment  day care  ADL   Eating/Feeding Minimal assistance  pt is right handed   Eating/Feeding Patient Percentage 80%   Grooming Minimal assistance   Upper Body Bathing Minimal assistance  with back   Lower Body Bathing Modified independent   Upper Body Dressing Minimal assistance  needs help with buttoning, zipping and tying   Lower Body Dressing Minimal assistance  needs assist with buttoning, zipping and tying   Toileting - Clothing Manipulation Minimal assistance  for clothing mgmt if pants have buttons or zippers   Toileting -  Hygiene Modified Independent   IADL   Shopping Needs to be accompanied on any shopping trip   Light Housekeeping Needs help with all home maintenance tasks   Meal Prep Needs to have meals prepared and served   Medication Management  Is responsible for taking medication in correct dosages at correct time   Sensation   Light Touch Impaired by gross assessment  feels dull on palm of hand and in fingers but able to locate   Hot/Cold Appears Intact   Proprioception Appears Intact   Coordination   Gross Motor Movements are Fluid and Coordinated No   Fine Motor Movements are Fluid and Coordinated No   9 Hole Peg Test L=23.19 R= 39.40   Box and Blocks R=47   Right 9 Hole Peg Test 39.40   Left 9 Hole Peg Test 23.19   Edema   Edema mild edema in hand and forearm   AROM   Overall AROM  Deficits   Overall AROM Comments pt with approximately 15% supination, 10% wrist flexion, 0% extension, 30% composite flexion, full finger extension.  extremely limited ulnar deviation and no radial deviation.    Hand Function   Comments Unable to assess strength today as will need to clarify MD order related to strengthening however clearly patient is signifcantly limited in strength throughour her entire right hand.                OT Short Term Goals - 08/04/14 1322    OT SHORT TERM GOAL #1   Title Pt will be I with HEP - 09/01/2014   Status New   OT SHORT TERM GOAL #2   Title Pt will report no more than 5/10 pain in R hand with activity   Status New   OT SHORT TERM GOAL #3   Title Patient will be mod I with buttoning with AE prn   Status New   OT SHORT TERM GOAL #4   Title Pt will be mod I with zipping with AE prn          OT Long Term Goals - 08/04/14 1324    OT LONG TERM GOAL #1   Title Pt will be I with upgraded HEP prn -09/29/2014   Status New   OT LONG TERM GOAL #2   Title Pt will be mod I with securing shoes with AE prn   Status New   OT LONG TERM GOAL #3   Title Pt will demonstrate improved coordination as evidenced by decreasing time on 9 hole peg test by 5 seconds   Status New   OT LONG TERM GOAL #4   Title Pt will demonstrate the abiity to use RUE as gross assist during basic ADL tasks   Status New           Plan - 08/04/14 1307    Clinical Impression Statement Pt is 58 year old female who sustained a R wrist fracture who then attended  therapy this summer per patient when hardware was removed; patient is now s/p R forearm tendon repair. Patient is experience the following:  decreased ROM, decreased strength, pain in R hand, decreased sensation, decreased functional use of R dominant hand.   Rehab Potential Fair   OT Frequency 2x / week   OT Duration 8 weeks   OT Treatment/Interventions Self-care/ADL training;Moist Heat;Fluidtherapy;Therapeutic exercise;Manual Therapy;DME and/or AE instruction;Passive range of motion;Splinting;Therapeutic activities;Patient/family education   Plan futher assess PROM;  clarify with MD brace schedule and any restrictions;  begin ROM/coordination        Problem List Patient Active Problem List   Diagnosis Date Noted  . Herpetic lesion 02/07/2014  . Intertriginous candidiasis 02/07/2014  . Itching 01/02/2014  . Anxiety disorder, unspecified anxiety disorder type 12/12/2013  . Depression 07/19/2013  . Distal radius fracture, right 10/20/2012  . Hordeolum 10/03/2012  . Intermittent low back pain 03/30/2012  . Swelling of finger, left 03/30/2012  . Heavy smoker (more than 20 cigarettes per day) 03/30/2012  . Health maintenance examination 03/30/2012  . Hot flashes, menopausal 02/28/2012  . Anxiety 02/28/2012  . Restless leg syndrome 02/28/2012  . Metatarsalgia of both feet 02/28/2012                                             Forde Radon, MS, OTR/L 08/04/2014 1:35 PM Phone: [phone] Fax: [phone]   Quay Burow 08/04/2014, 1:34 PM

## 2014-08-11 ENCOUNTER — Ambulatory Visit: Payer: PRIVATE HEALTH INSURANCE | Admitting: Occupational Therapy

## 2014-08-11 DIAGNOSIS — M6281 Muscle weakness (generalized): Secondary | ICD-10-CM

## 2014-08-11 DIAGNOSIS — R279 Unspecified lack of coordination: Secondary | ICD-10-CM

## 2014-08-11 DIAGNOSIS — Z5189 Encounter for other specified aftercare: Secondary | ICD-10-CM | POA: Diagnosis not present

## 2014-08-11 DIAGNOSIS — M79641 Pain in right hand: Secondary | ICD-10-CM

## 2014-08-11 DIAGNOSIS — M2560 Stiffness of unspecified joint, not elsewhere classified: Secondary | ICD-10-CM

## 2014-08-11 NOTE — Therapy (Signed)
Occupational Therapy Treatment  Patient Details  Name: [name] MRN: [id number] Date of Birth: 1956/03/07  Encounter Date: 08/11/2014      OT End of Session - 08/11/14 1108    Visit Number 2  2/15   Number of Visits 16   Date for OT Re-Evaluation 09/29/14   Authorization Type [employer]- 20 visit limit, 15 remaining   OT Start Time 1034   OT Stop Time 1102   OT Time Calculation (min) 28 min   Activity Tolerance Patient tolerated treatment well      Past Medical History  Diagnosis Date  . Anxiety 2009  . Arthritis 2000  . Fibroid 2003  . Hot flashes, menopausal 2012  . Allergy     seasonal  . RLS (restless legs syndrome)   . Wears glasses   . Depression   . Shortness of breath     with exertion    Past Surgical History  Procedure Laterality Date  . Neck surgery  2003    Cervical vertebroplasty   . Colonoscopy    . Open reduction internal fixation (orif) distal radial fracture Right 11/08/2012    Procedure: OPEN REDUCTION INTERNAL FIXATION (ORIF) RIGHT DISTAL RADIUS FRACTURE;  Surgeon: Linna Hoff, MD;  Location: [facility];  Service: Orthopedics;  Laterality: Right;  . Hardware removal Right 07/24/2013    Procedure: RIGHT WRIST HARDWARE REMOVAL, JOINT RELEASE, RIGHT HAND MANIPULATION UNDER ANESTHESIA;  Surgeon: Linna Hoff, MD;  Location: [facility];  Service: Orthopedics;  Laterality: Right;  . Wrist arthroscopy with debridement Right 07/21/2014    Procedure: RIGHT WRIST DISTAL RADIAL ULNAR JOINT DEBRIDEMENT AND JOINT RELEASE POSSIBLE TENDON INTERPOSITION;  Surgeon: Linna Hoff, MD;  Location: [facility];  Service: Orthopedics;  Laterality: Right;    There were no vitals taken for this visit.  Visit Diagnosis:  Lack of coordination  Pain of right hand  Generalized muscle weakness  Decreased range of motion      Subjective Assessment - 08/11/14 1038    Symptoms "I just can't move"   Pain Score 5    Pain Location  Hand   Pain Orientation Right   Pain Type --  Alleviating/aggravating factors:  ?heat/sleeping at night            OT Treatments/Exercises (OP) - 08/11/14 0001    Modalities   Modalities Fluidotherapy   RUE Fluidotherapy   Number Minutes Fluidotherapy 8 Minutes   RUE Fluidotherapy Location Hand;Wrist   Comments with no adverse reactions for pain/stiffness          OT Education - 08/11/14 1108    Education provided Yes   Education Details AROM HEP   Person(s) Educated Patient   Methods Explanation;Demonstration;Handout;Verbal cues   Comprehension Verbalized understanding;Returned demonstration;Verbal cues required              Plan - 08/11/14 1111    Clinical Impression Statement Pt is very limited with AROM.  Pt arrived >71min late so was seen for shortened session.  Reminded pt importance of being on time for sessions.   Plan still awaiting clarification from MD regarding brace schedule (did not wear brace today) and strengthening, continue with ROM        Problem List Patient Active Problem List   Diagnosis Date Noted  . Herpetic lesion 02/07/2014  . Intertriginous candidiasis 02/07/2014  . Itching 01/02/2014  . Anxiety disorder, unspecified anxiety disorder type 12/12/2013  . Depression 07/19/2013  . Distal  radius fracture, right 10/20/2012  . Hordeolum 10/03/2012  . Intermittent low back pain 03/30/2012  . Swelling of finger, left 03/30/2012  . Heavy smoker (more than 20 cigarettes per day) 03/30/2012  . Health maintenance examination 03/30/2012  . Hot flashes, menopausal 02/28/2012  . Anxiety 02/28/2012  . Restless leg syndrome 02/28/2012  . Metatarsalgia of both feet 02/28/2012                                             Vianne Bulls, OTR/L 08/11/2014 11:18 AM Phone: [phone] Fax: [phone]    [facility] 08/11/2014, 11:18 AM

## 2014-08-11 NOTE — Patient Instructions (Addendum)
AROM: Wrist Flexion   AROM: Wrist Flexion / Extension   Actively bend right wrist forward then back as far as possible. Repeat 10 times per set. Do 5 sessions per day.  Hold 5-10sec.      AROM: Wrist Radial / Ulnar Deviation Against Gravity   With thumb toward face, gently bend left wrist toward body, then away. Keep elbow bent and supported. Repeat 10 times per set. Do 5 sessions per day.  Hold 5-10sec.   AROM: Forearm Pronation / Supination   With right arm in handshake position, slowly rotate palm down until stretch is felt. Relax. Then rotate palm up until stretch is felt. Repeat 10 times per set. Do 5 sessions per day.  Hold 5-10sec.   AROM: Finger Flexion / Extension   Actively bend fingers of right hand. Start with knuckles furthest from palm, and slowly make a fist. Hold 5 seconds. Relax. Then straighten fingers as far as possible. Repeat 10 times per set. Do 5 sessions per day.  Hold 5-10sec.   Finger Opposition   Actively touch right thumb to each fingertip. Start with index finger and proceed toward little finger. Move slowly at first, then more rapidly as motion and coordination improve. Be sure to touch each fingertip. Repeat 10 times per set. Do 5 sessions per day.  Hold 5-10sec.   Flexor Tendon Gliding (Active Hook Fist)   With fingers and knuckles straight, bend middle and tip joints. Do not bend large knuckles. Repeat 10 times per set. Do 5 sessions per day.  Hold 5-10sec.

## 2014-08-13 ENCOUNTER — Encounter: Payer: Self-pay | Admitting: Occupational Therapy

## 2014-08-13 ENCOUNTER — Ambulatory Visit: Payer: PRIVATE HEALTH INSURANCE | Attending: Orthopedic Surgery | Admitting: Occupational Therapy

## 2014-08-13 DIAGNOSIS — M79641 Pain in right hand: Secondary | ICD-10-CM

## 2014-08-13 DIAGNOSIS — M6281 Muscle weakness (generalized): Secondary | ICD-10-CM

## 2014-08-13 DIAGNOSIS — Z5189 Encounter for other specified aftercare: Secondary | ICD-10-CM | POA: Diagnosis present

## 2014-08-13 DIAGNOSIS — R279 Unspecified lack of coordination: Secondary | ICD-10-CM

## 2014-08-13 DIAGNOSIS — M2560 Stiffness of unspecified joint, not elsewhere classified: Secondary | ICD-10-CM | POA: Diagnosis not present

## 2014-08-13 NOTE — Patient Instructions (Signed)
Pt began therapy today in fluidotherapy x10 min R UE while performing AROM to wrist, hand and forearm while using heat to increase AROM.  HEP for AROM R hand/wrist/forearm was reviewed and performed in clinic today. Pt continues with significant limitations in AROM and functional use of ight hand due to joint stiffness, pain and numbness per pt reports.   Gentle AROM and A/AROM R UE for flat and composite fist, blocked exercises within pt tolerance.   ADL retraining: Reviewed ADL's with pt and using Right UE for grooming as well as light ADL's (Brushing teeth with wide grip handle, holding wide grip pen and practicing loops, holding utensils in right hand - using wide grip etc) to encourage increased functional use R hand and AROM. Pt verbalized understanding of all of the above in clinic and di not have any questions.

## 2014-08-13 NOTE — Therapy (Signed)
[facility] [address] [place], [place], [address] Phone: [phone]   Fax:  [phone]  Occupational Therapy Treatment  Patient Details  Name: [name] MRN: [id number] Date of Birth: 05/27/1956  Encounter Date: 08/13/2014      OT End of Session - 08/13/14 1134    Visit Number 3  3/15   Number of Visits 16   Date for OT Re-Evaluation 09/29/14   Authorization Type [employer]- 20 visit limit, 15 remaining   OT Start Time 1017   OT Stop Time 1102   OT Time Calculation (min) 45 min   Activity Tolerance Patient tolerated treatment well   Behavior During Therapy Anxious      Past Medical History  Diagnosis Date  . Anxiety 2009  . Arthritis 2000  . Fibroid 2003  . Hot flashes, menopausal 2012  . Allergy     seasonal  . RLS (restless legs syndrome)   . Wears glasses   . Depression   . Shortness of breath     with exertion    Past Surgical History  Procedure Laterality Date  . Neck surgery  2003    Cervical vertebroplasty   . Colonoscopy    . Open reduction internal fixation (orif) distal radial fracture Right 11/08/2012    Procedure: OPEN REDUCTION INTERNAL FIXATION (ORIF) RIGHT DISTAL RADIUS FRACTURE;  Surgeon: Linna Hoff, MD;  Location: [place];  Service: Orthopedics;  Laterality: Right;  . Hardware removal Right 07/24/2013    Procedure: RIGHT WRIST HARDWARE REMOVAL, JOINT RELEASE, RIGHT HAND MANIPULATION UNDER ANESTHESIA;  Surgeon: Linna Hoff, MD;  Location: [place];  Service: Orthopedics;  Laterality: Right;  . Wrist arthroscopy with debridement Right 07/21/2014    Procedure: RIGHT WRIST DISTAL RADIAL ULNAR JOINT DEBRIDEMENT AND JOINT RELEASE POSSIBLE TENDON INTERPOSITION;  Surgeon: Linna Hoff, MD;  Location: [place];  Service: Orthopedics;  Laterality: Right;    There were no vitals taken for this visit.  Visit Diagnosis:  Lack of coordination  Pain of  right hand  Generalized muscle weakness  Decreased range of motion      Subjective Assessment - 08/13/14 1038    Symptoms Pt reports stiffness RUE, and "Numbness"   Limitations Order states no limitations however strength not checked.  MD provided splint and told pt "just wear it". Patient states he did not tell her how long to wear it or when to wear it. Patient does not have it on today.   Currently in Pain? No/denies   Pain Location Hand   Pain Orientation Right   Pain Descriptors / Indicators Numbness   Pain Type --  Alleviating/Aggrivating factors Heat/Sleeping at night            OT Treatments/Exercises (OP) - 08/13/14 1030    Exercises   Exercises Hand;Wrist  AROM   Fine Motor Coordination   Fine Motor Coordination Tendon glides;Thumb opposition;Maneuvering Blocks;Digit abduction;Digit adduction  R hand stacking and picking up 1" blocks   Tendon Glides --  R hand, all tendon glides x10 reps each   Hand Exercises   MCPJ Flexion PROM;AROM;AAROM;Right;10 reps  Tendon glides active, passive and active assistive   Opposition AROM;Right;PROM;AAROM;10 reps;Seated   Fine Motor Coordination   Maneuvering Blocks --  Picking up using 3 point pinch, stacking and placing x50   RUE Fluidotherapy   Number Minutes Fluidotherapy --  1 Min RUE while performing AROM   Comments --  AROM digits, wrist  and forearm.    Pt began therapy today in fluidotherapy x10 min R UE while performing AROM to wrist, hand and forearm while using heat to assist with increased AROM, pt exercising throughout session.  HEP for AROM R hand/wrist/forearm was reviewed and performed in clinic today. Pt continues with significant limitations in AROM and functional use of ight hand due to joint stiffness, pain and numbness per pt reports.   Gentle AROM and A/AROM R UE for flat and composite fist, blocked exercises within pt tolerance.   ADL retraining: Reviewed ADL's with pt and using Right UE for  grooming as well as light ADL's (Brushing teeth with wide grip handle, holding wide grip pen and practicing loops, holding utensils in right hand - using wide grip etc) to encourage increased functional use R hand and AROM. Pt verbalized understanding of all of the above in clinic and di not have any questions.      OT Education - 08/13/14 1134    Education provided Yes   Education Details Reviewed HEP, ADL's and light functional use right hand   Person(s) Educated Patient   Methods Explanation;Demonstration;Verbal cues   Comprehension Verbalized understanding             Plan - 08/13/14 1135    Clinical Impression Statement Pt continues to be significantly limited in AROM R hand/forearm. Reviewed HEP and ADL retraining session for light functional use today to encourage increaesd AROM R UE performed.   Plan Still awaiting clarification from MD regarding brace schedule (Pt did not wear brace today, but presented to clinic in RUE sling) and strengthening, continue with ROM and light functional use.   Consulted and Agree with Plan of Care Patient                            Problem List Patient Active Problem List   Diagnosis Date Noted  . Herpetic lesion 02/07/2014  . Intertriginous candidiasis 02/07/2014  . Itching 01/02/2014  . Anxiety disorder, unspecified anxiety disorder type 12/12/2013  . Depression 07/19/2013  . Distal radius fracture, right 10/20/2012  . Hordeolum 10/03/2012  . Intermittent low back pain 03/30/2012  . Swelling of finger, left 03/30/2012  . Heavy smoker (more than 20 cigarettes per day) 03/30/2012  . Health maintenance examination 03/30/2012  . Hot flashes, menopausal 02/28/2012  . Anxiety 02/28/2012  . Restless leg syndrome 02/28/2012  . Metatarsalgia of both feet 02/28/2012    Almyra Deforest, OT 08/13/2014, 11:41 AM

## 2014-08-18 ENCOUNTER — Ambulatory Visit: Payer: PRIVATE HEALTH INSURANCE | Admitting: Occupational Therapy

## 2014-08-20 ENCOUNTER — Ambulatory Visit: Payer: PRIVATE HEALTH INSURANCE | Admitting: Occupational Therapy

## 2014-08-20 ENCOUNTER — Encounter: Payer: Self-pay | Admitting: Occupational Therapy

## 2014-08-20 DIAGNOSIS — Z5189 Encounter for other specified aftercare: Secondary | ICD-10-CM | POA: Diagnosis not present

## 2014-08-20 DIAGNOSIS — R279 Unspecified lack of coordination: Secondary | ICD-10-CM

## 2014-08-20 DIAGNOSIS — M2560 Stiffness of unspecified joint, not elsewhere classified: Secondary | ICD-10-CM

## 2014-08-20 DIAGNOSIS — M79641 Pain in right hand: Secondary | ICD-10-CM

## 2014-08-20 DIAGNOSIS — M6281 Muscle weakness (generalized): Secondary | ICD-10-CM

## 2014-08-20 NOTE — Therapy (Signed)
[facility] [address] [place], [place], [address] Phone: [phone]   Fax:  [phone]  Occupational Therapy Treatment  Patient Details  Name: [name] MRN: [id number] Date of Birth: 09-02-1956  Encounter Date: 08/20/2014      OT End of Session - 08/20/14 1232    Visit Number 4  4/15   Number of Visits 15   Date for OT Re-Evaluation 09/29/14   Authorization Type [employer]- 20 visit limit, 15 remaining   OT Start Time 1018   OT Stop Time 1100   OT Time Calculation (min) 42 min   Activity Tolerance Patient tolerated treatment well   Behavior During Therapy [facility] for tasks assessed/performed;Anxious      Past Medical History  Diagnosis Date  . Anxiety 2009  . Arthritis 2000  . Fibroid 2003  . Hot flashes, menopausal 2012  . Allergy     seasonal  . RLS (restless legs syndrome)   . Wears glasses   . Depression   . Shortness of breath     with exertion    Past Surgical History  Procedure Laterality Date  . Neck surgery  2003    Cervical vertebroplasty   . Colonoscopy    . Open reduction internal fixation (orif) distal radial fracture Right 11/08/2012    Procedure: OPEN REDUCTION INTERNAL FIXATION (ORIF) RIGHT DISTAL RADIUS FRACTURE;  Surgeon: Linna Hoff, MD;  Location: [place];  Service: Orthopedics;  Laterality: Right;  . Hardware removal Right 07/24/2013    Procedure: RIGHT WRIST HARDWARE REMOVAL, JOINT RELEASE, RIGHT HAND MANIPULATION UNDER ANESTHESIA;  Surgeon: Linna Hoff, MD;  Location: [place];  Service: Orthopedics;  Laterality: Right;  . Wrist arthroscopy with debridement Right 07/21/2014    Procedure: RIGHT WRIST DISTAL RADIAL ULNAR JOINT DEBRIDEMENT AND JOINT RELEASE POSSIBLE TENDON INTERPOSITION;  Surgeon: Linna Hoff, MD;  Location: [place];  Service: Orthopedics;  Laterality: Right;    There were no vitals taken for this visit.  Visit Diagnosis:   Decreased range of motion  Generalized muscle weakness  Pain of right hand  Lack of coordination      Subjective Assessment - 08/20/14 1035    Symptoms Pt reports continued decreased functional use R hand and decreased AROM, Numbness. Denies pain this am.   Limitations In basket note from Dr Apolonio Schneiders - Orders  for AROM, PROM and modalities; No strengthening and "Pt does not need brace for comfort"   Currently in Pain? No/denies   Pain Score 0-No pain            OT Treatments/Exercises (OP) - 08/20/14 0001    Exercises   Exercises Wrist;Hand  AROM; PROM, A/AROM Right   Fine Motor Coordination   Fine Motor Coordination Tendon glides;Thumb opposition  Blocked ROM, A/AROM, PROM, place and hold.   Tendon Glides Right hand tendon glides and blocked ex's  performed in clinic x5 reps each; handout provided   Hand Exercises   MCPJ Flexion PROM;AROM;AAROM;Right;5 reps  MCP flexion, composite fist and hold, blocked DIP/PIP RUE   RUE Fluidotherapy   Number Minutes Fluidotherapy --  RUE x10 min while performing AROM throughout session   RUE Fluidotherapy Location Hand;Wrist;Forearm   Comments AROM throughout fluido. Digits, wrist and forearm  RUE    Pt educated in home program today for Right hand AROM, A/AROM, PROM and blocked exercises for tendon gliding, fist and hold. Pt was educated written and verbally as well as via  demonstration & performance in clinic today to perform 5-10 reps each ex, a minimum of 3-4 times/day (more if able). Pt verbalized understanding of this was also demonstrated in clinic w/ min vc's. She continues to demonstrate significant limitations in ROM R hand/wrist.  Gentle PROM RUE pro/supination and wrist flexion/extension.  Reviewed w/ pt that she should be using R hand for ADL and self care tasks as able. Reviewed using wide grip handles for utensils, pen, hair brush and tooth brush etc... To assist with increased AROM and functional use via increased  activity.      OT Education - 08/20/14 1231    Education provided Yes   Education Details Upgrade HEP to AROM, A/AROM, PROM tendon gliding, blocked flexion ex's, fist and place and hold. Use R hand for all light functional activity as able especially ADL's and grooming.   Person(s) Educated Patient   Methods Explanation;Demonstration;Verbal cues   Comprehension Verbalized understanding;Need further instruction              Plan - 08/20/14 1234    Clinical Impression Statement Pt continues to be significantly limited in range of motion of RUE hand and forearm. Upgrade home program to include blocked flexion exercises, AROM, A/AROM, PROM tendon gliding and encouragement of increasd light functional use  for ADL and self care tasks.    Rehab Potential Fair   OT Frequency 2x / week   OT Duration 8 weeks   OT Treatment/Interventions Self-care/ADL training;Moist Heat;Fluidtherapy;Therapeutic exercise;Manual Therapy;DME and/or AE instruction;Passive range of motion;Splinting;Therapeutic activities;Patient/family education   Plan MD has replied via In [employer] re: AROM, PROM and modalities. Pt does not need brace for comfort and no strengthening at this time, per Dr. Apolonio Schneiders. PROM and blocked flexion exercises RUE and functional activities as able.   Consulted and Agree with Plan of Care Patient                               Problem List Patient Active Problem List   Diagnosis Date Noted  . Herpetic lesion 02/07/2014  . Intertriginous candidiasis 02/07/2014  . Itching 01/02/2014  . Anxiety disorder, unspecified anxiety disorder type 12/12/2013  . Depression 07/19/2013  . Distal radius fracture, right 10/20/2012  . Hordeolum 10/03/2012  . Intermittent low back pain 03/30/2012  . Swelling of finger, left 03/30/2012  . Heavy smoker (more than 20 cigarettes per day) 03/30/2012  . Health maintenance examination 03/30/2012  . Hot flashes, menopausal  02/28/2012  . Anxiety 02/28/2012  . Restless leg syndrome 02/28/2012  . Metatarsalgia of both feet 02/28/2012    Almyra Deforest, OT 08/20/2014, 12:40 PM

## 2014-08-20 NOTE — Patient Instructions (Signed)
Pt educated in home program today for Right hand AROM, A/AROM, PROM and blocked exercises for tendon gliding, fist and hold. Pt was educated written and verbally as well as via demonstration & performance in clinic today to perform 5-10 reps each ex, a minimum of 3-4 times/day (more if able). Pt verbalized understanding of this was also demonstrated in clinic w/ min vc's. She continues to demonstrate significant limitations in ROM R hand/wrist.  Gentle PROM RUE pro/supination and wrist flexion/extension.  Reviewed w/ pt that she should be using R hand for ADL and self care tasks as able. Reviewed using wide grip handles for utensils, pen, hair brush and tooth brush etc... To assist with increased AROM and functional use via increased activity.

## 2014-08-25 ENCOUNTER — Encounter: Payer: Self-pay | Admitting: Occupational Therapy

## 2014-08-25 ENCOUNTER — Ambulatory Visit: Payer: PRIVATE HEALTH INSURANCE | Admitting: Occupational Therapy

## 2014-08-25 DIAGNOSIS — M79641 Pain in right hand: Secondary | ICD-10-CM

## 2014-08-25 DIAGNOSIS — M2560 Stiffness of unspecified joint, not elsewhere classified: Secondary | ICD-10-CM

## 2014-08-25 DIAGNOSIS — Z5189 Encounter for other specified aftercare: Secondary | ICD-10-CM | POA: Diagnosis not present

## 2014-08-25 DIAGNOSIS — R279 Unspecified lack of coordination: Secondary | ICD-10-CM

## 2014-08-25 DIAGNOSIS — M6281 Muscle weakness (generalized): Secondary | ICD-10-CM

## 2014-08-25 NOTE — Therapy (Signed)
[facility] [address] [place], [place], [address] Phone: [phone]   Fax:  [phone]  Occupational Therapy Treatment  Patient Details  Name: [name] MRN: [id number] Date of Birth: May 18, 1956  Encounter Date: 08/25/2014      OT End of Session - 08/25/14 1224    Visit Number 5  5/15 today   Number of Visits 15   Date for OT Re-Evaluation 09/29/14   Authorization Type [employer]- 20 visit limit, 15 remaining   OT Start Time 1018   OT Stop Time 1102   OT Time Calculation (min) 44 min   Activity Tolerance Patient tolerated treatment well   Behavior During Therapy Anxious;WFL for tasks assessed/performed      Past Medical History  Diagnosis Date  . Anxiety 2009  . Arthritis 2000  . Fibroid 2003  . Hot flashes, menopausal 2012  . Allergy     seasonal  . RLS (restless legs syndrome)   . Wears glasses   . Depression   . Shortness of breath     with exertion    Past Surgical History  Procedure Laterality Date  . Neck surgery  2003    Cervical vertebroplasty   . Colonoscopy    . Open reduction internal fixation (orif) distal radial fracture Right 11/08/2012    Procedure: OPEN REDUCTION INTERNAL FIXATION (ORIF) RIGHT DISTAL RADIUS FRACTURE;  Surgeon: Linna Hoff, MD;  Location: [place];  Service: Orthopedics;  Laterality: Right;  . Hardware removal Right 07/24/2013    Procedure: RIGHT WRIST HARDWARE REMOVAL, JOINT RELEASE, RIGHT HAND MANIPULATION UNDER ANESTHESIA;  Surgeon: Linna Hoff, MD;  Location: [place];  Service: Orthopedics;  Laterality: Right;  . Wrist arthroscopy with debridement Right 07/21/2014    Procedure: RIGHT WRIST DISTAL RADIAL ULNAR JOINT DEBRIDEMENT AND JOINT RELEASE POSSIBLE TENDON INTERPOSITION;  Surgeon: Linna Hoff, MD;  Location: [place];  Service: Orthopedics;  Laterality: Right;    There were no vitals taken for this visit.  Visit  Diagnosis:  Decreased range of motion  Generalized muscle weakness  Pain of right hand  Lack of coordination      Subjective Assessment - 08/25/14 1028    Symptoms Pt reports continued decreased functional use R hand and decreased AROM, Numbness. Denies pain this am however, she states that she woke up with pain right hand at 5:30am this morning.   Limitations In basket note from Dr Apolonio Schneiders - Orders  for AROM, PROM and modalities; No strengthening and "Pt does not need brace for comfort"   Currently in Pain? No/denies   Pain Score 0-No pain            OT Treatments/Exercises (OP) - 08/25/14 0001    Exercises   Exercises Hand  Blocked flexion ex's right digits/stressed hold to count 5    Hand Exercises   MCPJ Flexion PROM;AROM;AAROM;Right;5 reps  MCP flexion, composite fist and hold, blocked DIP/PIP RUE   Tendon Glides Right hand tendon glides and blocked ex's  performed in clinic x5 reps each; handout provided   Fine Motor Coordination Tendon glides;Thumb opposition  Blocked ROM, A/AROM, PROM, place and hold.   Other Hand Exercises Reviewed holding x5 count for blocked flexion ex's, pt appears to rush and require Min vc's for positioning and follow through  Right hand   Fine Motor Coordination   Maneuvering Blocks Right hand picking up and stacking; rest breaks as needed. Pt fatigues easily and becomes anxious  noted.  x50 blocks in stacks of 2 & 3 at a time.   RUE Fluidotherapy   Number Minutes Fluidotherapy --  RUE x32min while performing AROM throughout session   RUE Fluidotherapy Location Hand;Wrist;Forearm  AROM throughout fluido wrist, forearm and digits   Comments AROM  AROM throughout wrist, digits and forearm   Manual Therapy   Manual Therapy Joint mobilization;Passive ROM  Right hand x   Passive ROM PROM for composite flexion/extension of digits  R limited, within pt tolerance. Rest breaks "it's tired"    Stressed blocked flexion ex's and performance of home  program for increased range of motion right hand. Performance of light functional activity and use of RUE for ADL's and functional activity.      OT Education - 08/25/14 1223    Education Details Upgraded HEP for blocked flexion ex's and vc's for holding x5 count; gentle PROM and place and hold, use of R hand for all light functional activitiy as able (ADL's nad grooming).   Person(s) Educated Patient   Methods Explanation;Demonstration;Verbal cues;Handout   Comprehension Verbalized understanding;Returned demonstration;Need further instruction          OT Short Term Goals - 08/25/14 1228    OT SHORT TERM GOAL #1   Title Pt will be I with HEP - 09/01/2014   Status On-going   OT SHORT TERM GOAL #2   Title Pt will report no more than 5/10 pain in R hand with activity   Status On-going   OT SHORT TERM GOAL #3   Title Patient will be mod I with buttoning with AE prn   Status On-going   OT SHORT TERM GOAL #4   Title Pt will be mod I with zipping with AE prn   Status On-going          OT Long Term Goals - 08/25/14 1229    OT LONG TERM GOAL #1   Title Pt will be I with upgraded HEP prn -09/29/2014   Status On-going   OT LONG TERM GOAL #2   Title Pt will be mod I with securing shoes with AE prn   Status On-going   OT LONG TERM GOAL #3   Title Pt will demonstrate improved coordination as evidenced by decreasing time on 9 hole peg test by 5 seconds   Status On-going   OT LONG TERM GOAL #4   Title Pt will demonstrate the abiity to use RUE as gross assist during basic ADL tasks   Status On-going          Plan - 08/25/14 1225    Clinical Impression Statement Pt continues to be significantly limited in range of motion RUE. She should experience increased AROM with performance of blocked flexion ex's and AROM, A/AROM, PROM and tendon gliding as well as encouragement to use RUE for light functional ADL's & selfcare tasks. Follow through at home will be essential..   Rehab Potential  Fair   OT Frequency 2x / week   OT Duration 8 weeks   OT Treatment/Interventions Self-care/ADL training;Moist Heat;Fluidtherapy;Therapeutic exercise;Manual Therapy;DME and/or AE instruction;Passive range of motion;Splinting;Therapeutic activities;Patient/family education   Plan Review Blocked flexion ex's and functional activity w/ RUE (simulated ADL's/grooming tasks) as tolerated.   Consulted and Agree with Plan of Care Patient                               Problem List Patient Active Problem List  Diagnosis Date Noted  . Herpetic lesion 02/07/2014  . Intertriginous candidiasis 02/07/2014  . Itching 01/02/2014  . Anxiety disorder, unspecified anxiety disorder type 12/12/2013  . Depression 07/19/2013  . Distal radius fracture, right 10/20/2012  . Hordeolum 10/03/2012  . Intermittent low back pain 03/30/2012  . Swelling of finger, left 03/30/2012  . Heavy smoker (more than 20 cigarettes per day) 03/30/2012  . Health maintenance examination 03/30/2012  . Hot flashes, menopausal 02/28/2012  . Anxiety 02/28/2012  . Restless leg syndrome 02/28/2012  . Metatarsalgia of both feet 02/28/2012    Almyra Deforest, OT 08/25/2014, 12:32 PM

## 2014-08-25 NOTE — Patient Instructions (Signed)
    Do the above exercises at least 2-3 times a dayAROM: DIP Flexion / Extension   Pinch middle knuckle of __each______ finger of right hand to prevent bending. Bend end knuckle until stretch is felt. Hold _5___ seconds. Relax. Straighten finger as far as possible. Repeat ___10_ times per set. Do __1__ sets per session. Do _2-3___ sessions per day.  Copyright  [employer]. All rights reserved.  AROM: PIP Flexion / Extension   Pinch bottom knuckle of ____each____ finger of right hand to prevent bending. Actively bend middle knuckle until stretch is felt. Hold __5__ seconds. Relax. Straighten finger as far as possible. Repeat _10___ times per set. Do _1___ sets per session. Do __2-3__ sessions per day.  Copyright  [employer]. All rights reserved.

## 2014-08-27 ENCOUNTER — Ambulatory Visit: Payer: PRIVATE HEALTH INSURANCE | Admitting: *Deleted

## 2014-08-27 ENCOUNTER — Telehealth: Payer: Self-pay | Admitting: Occupational Therapy

## 2014-09-01 ENCOUNTER — Ambulatory Visit: Payer: PRIVATE HEALTH INSURANCE | Admitting: Occupational Therapy

## 2014-09-01 ENCOUNTER — Encounter: Payer: Self-pay | Admitting: Occupational Therapy

## 2014-09-01 DIAGNOSIS — R279 Unspecified lack of coordination: Secondary | ICD-10-CM

## 2014-09-01 DIAGNOSIS — Z5189 Encounter for other specified aftercare: Secondary | ICD-10-CM | POA: Diagnosis not present

## 2014-09-01 DIAGNOSIS — M6281 Muscle weakness (generalized): Secondary | ICD-10-CM

## 2014-09-01 DIAGNOSIS — M79641 Pain in right hand: Secondary | ICD-10-CM

## 2014-09-01 DIAGNOSIS — M2560 Stiffness of unspecified joint, not elsewhere classified: Secondary | ICD-10-CM

## 2014-09-01 NOTE — Therapy (Signed)
[facility] [address] [place], [place], [address] Phone: [phone]   Fax:  [phone]  Occupational Therapy Treatment  Patient Details  Name: [name] MRN: [id number] Date of Birth: February 16, 1956  Encounter Date: 09/01/2014      OT End of Session - 09/01/14 1202    Visit Number 6   Number of Visits 15   Date for OT Re-Evaluation 09/29/14   Authorization Type [employer]- 20 visit limit, 15 remaining   OT Start Time 1018   OT Stop Time 1100   OT Time Calculation (min) 42 min   Activity Tolerance Patient limited by fatigue;Patient limited by pain   Behavior During Therapy Anxious  due to pain      Past Medical History  Diagnosis Date  . Anxiety 2009  . Arthritis 2000  . Fibroid 2003  . Hot flashes, menopausal 2012  . Allergy     seasonal  . RLS (restless legs syndrome)   . Wears glasses   . Depression   . Shortness of breath     with exertion    Past Surgical History  Procedure Laterality Date  . Neck surgery  2003    Cervical vertebroplasty   . Colonoscopy    . Open reduction internal fixation (orif) distal radial fracture Right 11/08/2012    Procedure: OPEN REDUCTION INTERNAL FIXATION (ORIF) RIGHT DISTAL RADIUS FRACTURE;  Surgeon: Linna Hoff, MD;  Location: [facility];  Service: Orthopedics;  Laterality: Right;  . Hardware removal Right 07/24/2013    Procedure: RIGHT WRIST HARDWARE REMOVAL, JOINT RELEASE, RIGHT HAND MANIPULATION UNDER ANESTHESIA;  Surgeon: Linna Hoff, MD;  Location: [facility];  Service: Orthopedics;  Laterality: Right;  . Wrist arthroscopy with debridement Right 07/21/2014    Procedure: RIGHT WRIST DISTAL RADIAL ULNAR JOINT DEBRIDEMENT AND JOINT RELEASE POSSIBLE TENDON INTERPOSITION;  Surgeon: Linna Hoff, MD;  Location: [facility];  Service: Orthopedics;  Laterality: Right;    There were no vitals taken for this visit.  Visit  Diagnosis:  Decreased range of motion  Generalized muscle weakness  Pain of right hand  Lack of coordination      Subjective Assessment - 09/01/14 1042    Symptoms No pain initially, but increased with exercise   Currently in Pain? Yes   Pain Score 6   none at rest   Pain Location Hand   Pain Orientation Right   Aggravating Factors  movment   Pain Relieving Factors rest, heat                 OT Treatments/Exercises (OP) - 09/01/14 0001    ADLs   ADL Comments Checked STGs and discussed progress   Hand Exercises   Other Hand Exercises AROM blocked PIP and DIP flexion, composite flex with min cues   Other Hand Exercises PROM gross composite flexion and individual finer flex followed by place and holds   RUE Fluidotherapy   Number Minutes Fluidotherapy 12 Minutes   RUE Fluidotherapy Location Hand;Wrist   Comments pt performing AROM, for stiffness with no adverse reactions                  OT Short Term Goals - 09/01/14 1046    OT SHORT TERM GOAL #1   Title Pt will be I with HEP - 09/01/2014   Status Achieved   OT SHORT TERM GOAL #2   Title Pt will report no more than 5/10 pain  in R hand with activity   Status On-going  5-6/10 for light activity   OT SHORT TERM GOAL #3   Title Patient will be mod I with buttoning with AE prn   Status Achieved   OT SHORT TERM GOAL #4   Title Pt will be mod I with zipping with AE prn   Status On-going  not fully met           OT Long Term Goals - 08/25/14 1229    OT LONG TERM GOAL #1   Title Pt will be I with upgraded HEP prn -09/29/2014   Status On-going   OT LONG TERM GOAL #2   Title Pt will be mod I with securing shoes with AE prn   Status On-going   OT LONG TERM GOAL #3   Title Pt will demonstrate improved coordination as evidenced by decreasing time on 9 hole peg test by 5 seconds   Status On-going   OT LONG TERM GOAL #4   Title Pt will demonstrate the abiity to use RUE as gross assist during basic  ADL tasks   Status On-going               Plan - 09/01/14 1057    Clinical Impression Statement Pt continues to be significantly limited in RUE ROM with pain/fatigue as barriers as pt needs multiple rest breaks during session.  Gross composite finger flexion approx 40% (improved  from inital eval).   Plan strategies for ADLs, functional activities, ?finger flex glove        Problem List Patient Active Problem List   Diagnosis Date Noted  . Herpetic lesion 02/07/2014  . Intertriginous candidiasis 02/07/2014  . Itching 01/02/2014  . Anxiety disorder, unspecified anxiety disorder type 12/12/2013  . Depression 07/19/2013  . Distal radius fracture, right 10/20/2012  . Hordeolum 10/03/2012  . Intermittent low back pain 03/30/2012  . Swelling of finger, left 03/30/2012  . Heavy smoker (more than 20 cigarettes per day) 03/30/2012  . Health maintenance examination 03/30/2012  . Hot flashes, menopausal 02/28/2012  . Anxiety 02/28/2012  . Restless leg syndrome 02/28/2012  . Metatarsalgia of both feet 02/28/2012    [facility], OTR/L 09/01/2014, 1:12 PM  [facility] [address] [place], [place], [address] Phone: [phone]   Fax:  [phone]

## 2014-09-08 ENCOUNTER — Ambulatory Visit: Payer: PRIVATE HEALTH INSURANCE | Admitting: Occupational Therapy

## 2014-09-08 ENCOUNTER — Encounter: Payer: Self-pay | Admitting: Occupational Therapy

## 2014-09-08 DIAGNOSIS — Z5189 Encounter for other specified aftercare: Secondary | ICD-10-CM | POA: Diagnosis not present

## 2014-09-08 DIAGNOSIS — M79641 Pain in right hand: Secondary | ICD-10-CM

## 2014-09-08 DIAGNOSIS — M6281 Muscle weakness (generalized): Secondary | ICD-10-CM

## 2014-09-08 DIAGNOSIS — R279 Unspecified lack of coordination: Secondary | ICD-10-CM

## 2014-09-08 DIAGNOSIS — M2560 Stiffness of unspecified joint, not elsewhere classified: Secondary | ICD-10-CM

## 2014-09-08 NOTE — Patient Instructions (Signed)
Wear finger flexion glove for 10-44min 3 times a day.  Pull down fingers to get a good stretch.  After wearing 21min, pull fingers down further if possible.

## 2014-09-08 NOTE — Therapy (Addendum)
[facility] [address] [place], [place], [address] Phone: [phone]   Fax:  [phone]  Occupational Therapy Treatment  Patient Details  Name: [name] MRN: [id number] Date of Birth: 11/30/1955  Encounter Date: 09/08/2014      OT End of Session - 09/08/14 1229    Visit Number 7   Number of Visits 15   Date for OT Re-Evaluation 09/29/14   Authorization Type [employer]- 20 visit limit, 15 remaining   OT Start Time 1029   OT Stop Time 1102   OT Time Calculation (min) 33 min   Activity Tolerance Patient limited by fatigue;Patient limited by pain   Behavior During Therapy Anxious      Past Medical History  Diagnosis Date  . Anxiety 2009  . Arthritis 2000  . Fibroid 2003  . Hot flashes, menopausal 2012  . Allergy     seasonal  . RLS (restless legs syndrome)   . Wears glasses   . Depression   . Shortness of breath     with exertion    Past Surgical History  Procedure Laterality Date  . Neck surgery  2003    Cervical vertebroplasty   . Colonoscopy    . Open reduction internal fixation (orif) distal radial fracture Right 11/08/2012    Procedure: OPEN REDUCTION INTERNAL FIXATION (ORIF) RIGHT DISTAL RADIUS FRACTURE;  Surgeon: Linna Hoff, MD;  Location: [facility];  Service: Orthopedics;  Laterality: Right;  . Hardware removal Right 07/24/2013    Procedure: RIGHT WRIST HARDWARE REMOVAL, JOINT RELEASE, RIGHT HAND MANIPULATION UNDER ANESTHESIA;  Surgeon: Linna Hoff, MD;  Location: [facility];  Service: Orthopedics;  Laterality: Right;  . Wrist arthroscopy with debridement Right 07/21/2014    Procedure: RIGHT WRIST DISTAL RADIAL ULNAR JOINT DEBRIDEMENT AND JOINT RELEASE POSSIBLE TENDON INTERPOSITION;  Surgeon: Linna Hoff, MD;  Location: [facility];  Service: Orthopedics;  Laterality: Right;    There were no vitals taken for this visit.  Visit Diagnosis:  Decreased  range of motion  Generalized muscle weakness  Pain of right hand  Lack of coordination      Subjective Assessment - 09/08/14 1036    Symptoms "Just sore with the weather" when arrived.  "I was starting to have a panic attack" (with PROM/pain).   Limitations In basket note from Dr Apolonio Schneiders - Orders  for AROM, PROM and modalities; No strengthening and "Pt does not need brace for comfort"   Pain Score --  0-9/10   Pain Location Hand   Pain Orientation Right   Pain Descriptors / Indicators Sore   Aggravating Factors  movement, cold/rainy weather   Pain Relieving Factors rest, heat                 OT Treatments/Exercises (OP) - 09/08/14 0001    Hand Exercises   Other Hand Exercises PROM to fingers in isolated joint and composite flexion, followed by place and hold composite flexion.   Other Hand Exercises Picking up small cones for MP flexion and cylinder pegs for IP flex (with and without holding small cone in hand to facilitate IP flexion.   RUE Fluidotherapy   Number Minutes Fluidotherapy 8 Minutes   RUE Fluidotherapy Location Hand;Wrist   Comments with no adverse reactions for pain/stiffness                OT Education - 09/08/14 1048    Education provided Yes   Education  Details Finger flex glove wear.  10-15 min 3x/day   Person(s) Educated Patient   Methods Explanation;Demonstration;Handout;Verbal cues   Comprehension Verbalized understanding          OT Short Term Goals - 09/01/14 1046    OT SHORT TERM GOAL #1   Title Pt will be I with HEP - 09/01/2014   Status Achieved   OT SHORT TERM GOAL #2   Title Pt will report no more than 5/10 pain in R hand with activity   Status On-going  5-6/10 for light activity   OT SHORT TERM GOAL #3   Title Patient will be mod I with buttoning with AE prn   Status Achieved   OT SHORT TERM GOAL #4   Title Pt will be mod I with zipping with AE prn   Status On-going  not fully met           OT Long Term  Goals - 08/25/14 1229    OT LONG TERM GOAL #1   Title Pt will be I with upgraded HEP prn -09/29/2014   Status On-going   OT LONG TERM GOAL #2   Title Pt will be mod I with securing shoes with AE prn   Status On-going   OT LONG TERM GOAL #3   Title Pt will demonstrate improved coordination as evidenced by decreasing time on 9 hole peg test by 5 seconds   Status On-going   OT LONG TERM GOAL #4   Title Pt will demonstrate the abiity to use RUE as gross assist during basic ADL tasks   Status On-going               Plan - 09/08/14 1038    Clinical Impression Statement Pt arrived 10 min late.  Pt demo decreased stiffness at end of session, but is limited by pain/anxiety and needs multiple breaks.  Pt tearful today.   Plan functional activities, strategies for ADLs prn, A/PROM        Problem List Patient Active Problem List   Diagnosis Date Noted  . Herpetic lesion 02/07/2014  . Intertriginous candidiasis 02/07/2014  . Itching 01/02/2014  . Anxiety disorder, unspecified anxiety disorder type 12/12/2013  . Depression 07/19/2013  . Distal radius fracture, right 10/20/2012  . Hordeolum 10/03/2012  . Intermittent low back pain 03/30/2012  . Swelling of finger, left 03/30/2012  . Heavy smoker (more than 20 cigarettes per day) 03/30/2012  . Health maintenance examination 03/30/2012  . Hot flashes, menopausal 02/28/2012  . Anxiety 02/28/2012  . Restless leg syndrome 02/28/2012  . Metatarsalgia of both feet 02/28/2012    [facility], OTR/L 09/08/2014, 1:46 PM  [facility] [address] [place], [place], [address] Phone: [phone]   Fax:  [phone]

## 2014-09-09 ENCOUNTER — Ambulatory Visit: Payer: PRIVATE HEALTH INSURANCE | Admitting: Occupational Therapy

## 2014-09-11 ENCOUNTER — Encounter: Payer: PRIVATE HEALTH INSURANCE | Admitting: Occupational Therapy

## 2014-09-15 ENCOUNTER — Ambulatory Visit: Payer: PRIVATE HEALTH INSURANCE | Attending: Orthopedic Surgery | Admitting: Occupational Therapy

## 2014-09-15 ENCOUNTER — Telehealth: Payer: Self-pay | Admitting: Occupational Therapy

## 2014-09-15 DIAGNOSIS — R279 Unspecified lack of coordination: Secondary | ICD-10-CM | POA: Insufficient documentation

## 2014-09-15 DIAGNOSIS — M79641 Pain in right hand: Secondary | ICD-10-CM | POA: Insufficient documentation

## 2014-09-15 DIAGNOSIS — M6281 Muscle weakness (generalized): Secondary | ICD-10-CM | POA: Insufficient documentation

## 2014-09-15 DIAGNOSIS — Z5189 Encounter for other specified aftercare: Secondary | ICD-10-CM | POA: Insufficient documentation

## 2014-09-15 DIAGNOSIS — M2560 Stiffness of unspecified joint, not elsewhere classified: Secondary | ICD-10-CM | POA: Insufficient documentation

## 2014-09-15 NOTE — Telephone Encounter (Signed)
Called pt's home #, but unable to leave message as mailbox was full.  Then, called and left message on cell # regarding missed appt today and reminder of next scheduled appt.

## 2014-09-16 ENCOUNTER — Encounter: Payer: PRIVATE HEALTH INSURANCE | Admitting: Occupational Therapy

## 2014-09-16 ENCOUNTER — Ambulatory Visit: Payer: PRIVATE HEALTH INSURANCE | Admitting: Occupational Therapy

## 2014-09-18 ENCOUNTER — Encounter: Payer: PRIVATE HEALTH INSURANCE | Admitting: Occupational Therapy

## 2014-09-19 ENCOUNTER — Encounter: Payer: Self-pay | Admitting: Occupational Therapy

## 2014-09-19 ENCOUNTER — Ambulatory Visit: Payer: PRIVATE HEALTH INSURANCE | Admitting: Occupational Therapy

## 2014-09-19 DIAGNOSIS — R279 Unspecified lack of coordination: Secondary | ICD-10-CM | POA: Diagnosis not present

## 2014-09-19 DIAGNOSIS — M79641 Pain in right hand: Secondary | ICD-10-CM

## 2014-09-19 DIAGNOSIS — M2560 Stiffness of unspecified joint, not elsewhere classified: Secondary | ICD-10-CM | POA: Diagnosis not present

## 2014-09-19 DIAGNOSIS — Z5189 Encounter for other specified aftercare: Secondary | ICD-10-CM | POA: Diagnosis present

## 2014-09-19 DIAGNOSIS — M6281 Muscle weakness (generalized): Secondary | ICD-10-CM | POA: Diagnosis not present

## 2014-09-19 NOTE — Patient Instructions (Signed)
Pt was instructed in light functional use at home for ADL's and functional activity. Reviewed examples and tasks that she may be able to perform (wash dishes, use wide grip handles on brush and comb for grooming activities - foam applied today to both in clinic and pt encouraged to use), pt verbalized understanding.  Pt was also verbally educated that she missed 2 appts this week (No show) w/o calling to cancel and that she would be d/c'd should she no show again. Pt stated that she had to "go out of town" and stated that her husband had tried to call. No messages received, but calls were left for pt (see telephone encounter notes from this week for details).

## 2014-09-19 NOTE — Therapy (Signed)
[facility] [address] [place], [place], [address] Phone: [phone]   Fax:  [phone]  Occupational Therapy Treatment  Patient Details  Name: [name] MRN: [id number] Date of Birth: 30-Dec-1955 Referring Provider:  Charlane Ferretti, MD  Encounter Date: 09/19/2014      OT End of Session - 09/19/14 1236    Visit Number 8   Number of Visits 15   Date for OT Re-Evaluation 09/29/14   Authorization Type [employer]- 20 visit limit, 15 remaining   OT Start Time 0934   OT Stop Time 1018   OT Time Calculation (min) 44 min   Activity Tolerance Patient limited by pain   Behavior During Therapy Anxious      Past Medical History  Diagnosis Date  . Anxiety 2009  . Arthritis 2000  . Fibroid 2003  . Hot flashes, menopausal 2012  . Allergy     seasonal  . RLS (restless legs syndrome)   . Wears glasses   . Depression   . Shortness of breath     with exertion    Past Surgical History  Procedure Laterality Date  . Neck surgery  2003    Cervical vertebroplasty   . Colonoscopy    . Open reduction internal fixation (orif) distal radial fracture Right 11/08/2012    Procedure: OPEN REDUCTION INTERNAL FIXATION (ORIF) RIGHT DISTAL RADIUS FRACTURE;  Surgeon: Sharma Covert, MD;  Location: [facility];  Service: Orthopedics;  Laterality: Right;  . Hardware removal Right 07/24/2013    Procedure: RIGHT WRIST HARDWARE REMOVAL, JOINT RELEASE, RIGHT HAND MANIPULATION UNDER ANESTHESIA;  Surgeon: Sharma Covert, MD;  Location: [facility];  Service: Orthopedics;  Laterality: Right;  . Wrist arthroscopy with debridement Right 07/21/2014    Procedure: RIGHT WRIST DISTAL RADIAL ULNAR JOINT DEBRIDEMENT AND JOINT RELEASE POSSIBLE TENDON INTERPOSITION;  Surgeon: Sharma Covert, MD;  [facility];  Service: Orthopedics;  Laterality: Right;    There were no vitals taken for this visit.  Visit  Diagnosis:  Decreased range of motion  Generalized muscle weakness  Pain of right hand  Lack of coordination      Subjective Assessment - 09/19/14 0951    Symptoms Pt reports that she "Had to go out of town" as she missed 2 appts this past week. "My husband tried to call" Pt was educated that she would be d/c if she missed another appt. She verbalized understanding of this. Pt denis pain, reports "Stiffness".   Pertinent History Pt has return to MD appt (Dr Orlan Leavens) on 09/23/14.    Currently in Pain? No/denies   Pain Location --  R hand stiffness, "Not pain"                 OT Treatments/Exercises (OP) - 09/19/14 0001    ADLs   Overall ADLs Cylindrical foam issued and applied to pt's brush and comb in clinic today to assist with increased grip surface area and pt ability to hold/manipulate w/ R hand.  Pt verbalized understanding and returned demonstration.   ADL Education Given Yes  To use R hand for washing dishes, bathe, light funct use   Fine Motor Coordination   Fine Motor Coordination Large Pegboard  Clothes pins: yellow, red, blue rest breaks.    Hand Exercises   Other Hand Exercises PROM to fingers in isolated joint and composite flexion, followed by place and hold composite flexion.   Other Hand Exercises Discussed  stacking and picking up small objects at home, cups etc...  Graded clothes pins (yellow, red and blue) multi rest breaks     Pt was instructed in light functional use at home for ADL's and functional activity. Reviewed examples and tasks that she may be able to perform (wash dishes, use wide grip handles on brush and comb for grooming activities - foam applied today to both in clinic and pt encouraged to use), pt verbalized understanding.  Pt was also verbally educated that she missed 2 appts this week (No show) w/o calling to cancel and that she would be d/c'd should she no show again. Pt stated that she had to "go out of town" and stated that her  husband had tried to call. No messages received, but calls were left for pt (see telephone encounter notes from this week for details).           OT Education - 09/19/14 1234    Education provided Yes   Education Details Light functional use R hand, built up handles to increase grip surface area, discussion re:N/S for 2 appt this week.   Person(s) Educated Patient   Methods Explanation;Demonstration   Comprehension Verbalized understanding          OT Short Term Goals - 09/01/14 1046    OT SHORT TERM GOAL #1   Title Pt will be I with HEP - 09/01/2014   Status Achieved   OT SHORT TERM GOAL #2   Title Pt will report no more than 5/10 pain in R hand with activity   Status On-going  5-6/10 for light activity   OT SHORT TERM GOAL #3   Title Patient will be mod I with buttoning with AE prn   Status Achieved   OT SHORT TERM GOAL #4   Title Pt will be mod I with zipping with AE prn   Status On-going  not fully met           OT Long Term Goals - 08/25/14 1229    OT LONG TERM GOAL #1   Title Pt will be I with upgraded HEP prn -09/29/2014   Status On-going   OT LONG TERM GOAL #2   Title Pt will be mod I with securing shoes with AE prn   Status On-going   OT LONG TERM GOAL #3   Title Pt will demonstrate improved coordination as evidenced by decreasing time on 9 hole peg test by 5 seconds   Status On-going   OT LONG TERM GOAL #4   Title Pt will demonstrate the abiity to use RUE as gross assist during basic ADL tasks   Status On-going               Plan - 09/19/14 1237    Clinical Impression Statement Pt cont to demonstrate deficits in R hand AROM and functional use, she is limited by pain/anxiety and requires frequent rest breaks. She no showed for last 2 appts and was educated that she would be d/c's should  she miss or no show for another. She verbalized understanding of this.   Rehab Potential Fair   OT Frequency 2x / week   OT Duration 8 weeks   OT  Treatment/Interventions Self-care/ADL training;Moist Heat;Fluidtherapy;Therapeutic exercise;Manual Therapy;DME and/or AE instruction;Passive range of motion;Splinting;Therapeutic activities;Patient/family education   Plan ADL's, functional use right hand, A/PROM   Recommended Other Services Pt has f/u w/ MD on 09/23/14   Consulted and Agree with Plan of Care Patient  Problem List Patient Active Problem List   Diagnosis Date Noted  . Herpetic lesion 02/07/2014  . Intertriginous candidiasis 02/07/2014  . Itching 01/02/2014  . Anxiety disorder, unspecified anxiety disorder type 12/12/2013  . Depression 07/19/2013  . Distal radius fracture, right 10/20/2012  . Hordeolum 10/03/2012  . Intermittent low back pain 03/30/2012  . Swelling of finger, left 03/30/2012  . Heavy smoker (more than 20 cigarettes per day) 03/30/2012  . Health maintenance examination 03/30/2012  . Hot flashes, menopausal 02/28/2012  . Anxiety 02/28/2012  . Restless leg syndrome 02/28/2012  . Metatarsalgia of both feet 02/28/2012    [place], Amy Ardath Sax, OTR/L 09/19/2014, 12:42 PM  [facility] [address] [place], [place], [address] Phone: [phone]   Fax:  [phone]

## 2014-09-22 ENCOUNTER — Encounter: Payer: Self-pay | Admitting: Occupational Therapy

## 2014-09-22 ENCOUNTER — Ambulatory Visit: Payer: PRIVATE HEALTH INSURANCE | Admitting: Occupational Therapy

## 2014-09-22 DIAGNOSIS — M6281 Muscle weakness (generalized): Secondary | ICD-10-CM

## 2014-09-22 DIAGNOSIS — M2560 Stiffness of unspecified joint, not elsewhere classified: Secondary | ICD-10-CM

## 2014-09-22 DIAGNOSIS — M79641 Pain in right hand: Secondary | ICD-10-CM

## 2014-09-22 DIAGNOSIS — R279 Unspecified lack of coordination: Secondary | ICD-10-CM

## 2014-09-22 DIAGNOSIS — Z5189 Encounter for other specified aftercare: Secondary | ICD-10-CM | POA: Diagnosis not present

## 2014-09-22 NOTE — Therapy (Signed)
[facility] [address] [place], [place], [address] Phone: [phone]   Fax:  [phone]  Patient Details  Name: [name] MRN: [id number] Date of Birth: [birth year] Referring Provider:  No ref. provider found  Encounter Date: 09/22/2014  Dr. Apolonio Schneiders, [name] has been seen for 9 visit for OT.  Treatment has included AE for ADLs, A/PROM, R hand functional use, fludio, finger flexion glove (unable to tolerate e-stim).  She continues to remain stiff and has difficulty tolerating much PROM due to pain/anxiety and needs frequent breaks.  Pt only demo approx 45% gross finger flexion after stretching.  She does report some increase in functional use.  Another barrier has been attendance as pt has typically only been seen 1x/wk due to cancellations/no shows.  We may need to discharge soon due to slowed progress.  Can we initiate putty/gentle strengthening?   Thanks,   Vianne Bulls, OTR/L 09/22/2014, 12:58 PM  [facility] [address] [place], [place], [address] Phone: [phone]   Fax:  [phone]

## 2014-09-22 NOTE — Therapy (Signed)
[facility] [address] [place], [place], [address] Phone: [phone]   Fax:  [phone]  Occupational Therapy Treatment  Patient Details  Name: [name] MRN: [id number] Date of Birth: 1956/08/30 Referring Provider:  Bernadene Bell, MD  Encounter Date: 09/22/2014      OT End of Session - 09/22/14 1248    Visit Number 9   Number of Visits 15   Date for OT Re-Evaluation 09/29/14   Authorization Type [employer]- 20 visit limit, 15 remaining   Authorization Time Period wk 7/8   OT Start Time 1100   OT Stop Time 1148   OT Time Calculation (min) 48 min   Activity Tolerance Patient limited by pain      Past Medical History  Diagnosis Date  . Anxiety 2009  . Arthritis 2000  . Fibroid 2003  . Hot flashes, menopausal 2012  . Allergy     seasonal  . RLS (restless legs syndrome)   . Wears glasses   . Depression   . Shortness of breath     with exertion    Past Surgical History  Procedure Laterality Date  . Neck surgery  2003    Cervical vertebroplasty   . Colonoscopy    . Open reduction internal fixation (orif) distal radial fracture Right 11/08/2012    Procedure: OPEN REDUCTION INTERNAL FIXATION (ORIF) RIGHT DISTAL RADIUS FRACTURE;  Surgeon: Linna Hoff, MD;  Location: [facility];  Service: Orthopedics;  Laterality: Right;  . Hardware removal Right 07/24/2013    Procedure: RIGHT WRIST HARDWARE REMOVAL, JOINT RELEASE, RIGHT HAND MANIPULATION UNDER ANESTHESIA;  Surgeon: Linna Hoff, MD;  Location: [facility];  Service: Orthopedics;  Laterality: Right;  . Wrist arthroscopy with debridement Right 07/21/2014    Procedure: RIGHT WRIST DISTAL RADIAL ULNAR JOINT DEBRIDEMENT AND JOINT RELEASE POSSIBLE TENDON INTERPOSITION;  Surgeon: Linna Hoff, MD;  Location: [facility];  Service: Orthopedics;  Laterality: Right;    There were no vitals taken for this visit.  Visit  Diagnosis:  Decreased range of motion  Generalized muscle weakness  Pain of right hand  Lack of coordination      Subjective Assessment - 09/22/14 1111    Symptoms Pt reports 9/10 pain this am, but no pain at beginning of session.   Pertinent History Pt has return to MD appt (Dr Apolonio Schneiders) on 09/23/14.    Limitations In basket note from Dr Apolonio Schneiders - Orders  for AROM, PROM and modalities; No strengthening and "Pt does not need brace for comfort"   Currently in Pain? No/denies   Pain Score --  0-9/10   Pain Location Hand   Pain Orientation Right   Pain Type Chronic pain   Aggravating Factors  movement, cold/rainy weather   Pain Relieving Factors rest, heat                 OT Treatments/Exercises (OP) - 09/22/14 0001    ADLs   ADL Comments Began checking goals and discussing progress.     ADL Education Given Yes   Button Hook instructed in use and zipper pull use/zipper ring.  Pt verbalized understanding   Foam Grip for Utensils/Toothbrush/Pen issued foam for utensils and pt verbalized understanding of use   Hand Exercises   Other Hand Exercises Removing cylinder pegs with mod cues to focus on composite finger flexion around each.  Pt demo approx 45% gross finger flexion.   Other Hand Exercises Towel  scrunches for finger flex/ext AROM with min cues.  MPs flexion grossly 70-75 degrees, PIP flex grossly 60-65 degrees, DIP flex grossly 15-20 degrees after PROM.   Acupuncturist Location Attempted NMES to finger flexors, but pt unable to tolerate/did not like sensation and did not wish to try.  Therefore, discontinued   RUE Fluidotherapy   Number Minutes Fluidotherapy 12 Minutes   RUE Fluidotherapy Location Hand;Wrist   Comments with fingers wrapped in flex for stretch, no adverse reactions   Manual Therapy   Passive ROM PROM for composite flex of digits and isolated joint flex each digit.  Pt with decreased tolerance and needed breaks.                 OT Education - 09/22/14 1247    Education Details built-up handles for utensils, zipper pulls, button hook   Person(s) Educated Patient   Methods Explanation;Demonstration   Comprehension Verbalized understanding          OT Short Term Goals - 09/22/14 1145    OT SHORT TERM GOAL #1   Title Pt will be I with HEP - 09/01/2014   Status Achieved   OT SHORT TERM GOAL #2   Title Pt will report no more than 5/10 pain in R hand with activity   Status On-going  Not met.  10/10   OT SHORT TERM GOAL #3   Title Patient will be mod I with buttoning with AE prn   Status Achieved   OT SHORT TERM GOAL #4   Title Pt will be mod I with zipping with AE prn   Status On-going  instructed in AE           OT Long Term Goals - 09/22/14 1142    OT LONG TERM GOAL #1   Title Pt will be I with upgraded HEP prn -09/29/2014   Status On-going   OT LONG TERM GOAL #2   Title Pt will be mod I with securing shoes with AE prn   Status Achieved  per pt report   OT LONG TERM GOAL #3   Title Pt will demonstrate improved coordination as evidenced by decreasing time on 9 hole peg test by 5 seconds   Status On-going   OT LONG TERM GOAL #4   Title Pt will demonstrate the abiity to use RUE as gross assist during basic ADL tasks   Status Achieved               Plan - 09/22/14 1250    Clinical Impression Statement Pt continues to demo significant deficits in ROM and is limited in PROM due to pain/anxiety and frequent breaks.  Multiple cancels/no shows also limits progress.  However, pt reports some inmprovement in R hand functional use.   Plan functional use R hand, A/PROM        Problem List Patient Active Problem List   Diagnosis Date Noted  . Herpetic lesion 02/07/2014  . Intertriginous candidiasis 02/07/2014  . Itching 01/02/2014  . Anxiety disorder, unspecified anxiety disorder type 12/12/2013  . Depression 07/19/2013  . Distal radius fracture, right 10/20/2012   . Hordeolum 10/03/2012  . Intermittent low back pain 03/30/2012  . Swelling of finger, left 03/30/2012  . Heavy smoker (more than 20 cigarettes per day) 03/30/2012  . Health maintenance examination 03/30/2012  . Hot flashes, menopausal 02/28/2012  . Anxiety 02/28/2012  . Restless leg syndrome 02/28/2012  . Metatarsalgia of both feet 02/28/2012  [facility], OTR/L 09/22/2014, 12:57 PM  [facility] [address] [place], [place], [address] Phone: [phone]   Fax:  [phone]

## 2014-09-23 ENCOUNTER — Encounter: Payer: PRIVATE HEALTH INSURANCE | Admitting: Occupational Therapy

## 2014-09-25 ENCOUNTER — Ambulatory Visit: Payer: PRIVATE HEALTH INSURANCE | Admitting: Occupational Therapy

## 2014-09-25 ENCOUNTER — Encounter: Payer: PRIVATE HEALTH INSURANCE | Admitting: Occupational Therapy

## 2014-09-25 ENCOUNTER — Encounter: Payer: Self-pay | Admitting: Occupational Therapy

## 2014-09-25 DIAGNOSIS — M6281 Muscle weakness (generalized): Secondary | ICD-10-CM

## 2014-09-25 DIAGNOSIS — Z5189 Encounter for other specified aftercare: Secondary | ICD-10-CM | POA: Diagnosis not present

## 2014-09-25 DIAGNOSIS — M2560 Stiffness of unspecified joint, not elsewhere classified: Secondary | ICD-10-CM

## 2014-09-25 DIAGNOSIS — R279 Unspecified lack of coordination: Secondary | ICD-10-CM

## 2014-09-25 DIAGNOSIS — M79641 Pain in right hand: Secondary | ICD-10-CM

## 2014-09-25 NOTE — Therapy (Signed)
[facility] [address] [place], [place], [address] Phone: [phone]   Fax:  [phone]  Occupational Therapy Treatment  Patient Details  Name: [name] MRN: [id number] Date of Birth: 1955-12-14 Referring Provider:  Bernadene Bell, MD  Encounter Date: 09/25/2014      OT End of Session - 09/25/14 1157    Visit Number 10   Number of Visits 15   Date for OT Re-Evaluation 09/29/14   Authorization Type [employer]- 20 visit limit, 15 remaining   Authorization Time Period wk 7/8   OT Start Time 1105   OT Stop Time 1150   OT Time Calculation (min) 45 min   Activity Tolerance Patient limited by pain      Past Medical History  Diagnosis Date  . Anxiety 2009  . Arthritis 2000  . Fibroid 2003  . Hot flashes, menopausal 2012  . Allergy     seasonal  . RLS (restless legs syndrome)   . Wears glasses   . Depression   . Shortness of breath     with exertion    Past Surgical History  Procedure Laterality Date  . Neck surgery  2003    Cervical vertebroplasty   . Colonoscopy    . Open reduction internal fixation (orif) distal radial fracture Right 11/08/2012    Procedure: OPEN REDUCTION INTERNAL FIXATION (ORIF) RIGHT DISTAL RADIUS FRACTURE;  Surgeon: Linna Hoff, MD;  Location: [facility];  Service: Orthopedics;  Laterality: Right;  . Hardware removal Right 07/24/2013    Procedure: RIGHT WRIST HARDWARE REMOVAL, JOINT RELEASE, RIGHT HAND MANIPULATION UNDER ANESTHESIA;  Surgeon: Linna Hoff, MD;  Location: [facility];  Service: Orthopedics;  Laterality: Right;  . Wrist arthroscopy with debridement Right 07/21/2014    Procedure: RIGHT WRIST DISTAL RADIAL ULNAR JOINT DEBRIDEMENT AND JOINT RELEASE POSSIBLE TENDON INTERPOSITION;  Surgeon: Linna Hoff, MD;  Location: [facility];  Service: Orthopedics;  Laterality: Right;    There were no vitals taken for this visit.  Visit  Diagnosis:  Decreased range of motion  Generalized muscle weakness  Pain of right hand  Lack of coordination      Subjective Assessment - 09/25/14 1107    Symptoms Pt saw Dr. Caralyn Guile on 09/23/14.    Currently in Pain? No/denies  0-9/10, 0 at beginning of session   Pain Location Hand   Pain Orientation Right   Pain Type Chronic pain   Aggravating Factors  movement, cold/rainy weather   Pain Relieving Factors rest, heat                 OT Treatments/Exercises (OP) - 09/25/14 1115    Hand Exercises   Other Hand Exercises Pt issued theraputty ex's in mass grasp and tip pinch x10 -15 reps each. Pt performed in clinic and issued for HEP   Other Hand Exercises Pt  also performed weight bearing ex in quadraped (w/ modifications for Rt hand/wrist): A-P wt. shifts and disengaging LUE to place increased weight on RUE for pain management. Also performed wall slides w/ towel for overall health and ROM RUE. (? Complex regional pain syndrome. Pt encouraged to discuss further w/ MD)   RUE Fluidotherapy   Number Minutes Fluidotherapy 12 Minutes   RUE Fluidotherapy Location Hand;Wrist   Comments to decrease pain/stiffness   Manual Therapy   Manual Therapy Passive ROM  mass flexion, IP flexion and MP flexion  OT Education - 09/25/14 1142    Education provided Yes   Education Details putty, wt bearing (quadraped) and scrubbing HEP (Pt issued yellow putty)   Person(s) Educated Patient   Methods Explanation;Demonstration;Handout   Comprehension Verbalized understanding;Returned demonstration          OT Short Term Goals - 09/22/14 1145    OT SHORT TERM GOAL #1   Title Pt will be I with HEP - 09/01/2014   Status Achieved   OT SHORT TERM GOAL #2   Title Pt will report no more than 5/10 pain in R hand with activity   Status On-going  Not met.  10/10   OT SHORT TERM GOAL #3   Title Patient will be mod I with buttoning with AE prn   Status Achieved   OT  SHORT TERM GOAL #4   Title Pt will be mod I with zipping with AE prn   Status On-going  instructed in AE           OT Long Term Goals - 09/22/14 1142    OT LONG TERM GOAL #1   Title Pt will be I with upgraded HEP prn -09/29/2014   Status On-going   OT LONG TERM GOAL #2   Title Pt will be mod I with securing shoes with AE prn   Status Achieved  per pt report   OT LONG TERM GOAL #3   Title Pt will demonstrate improved coordination as evidenced by decreasing time on 9 hole peg test by 5 seconds   Status On-going   OT LONG TERM GOAL #4   Title Pt will demonstrate the abiity to use RUE as gross assist during basic ADL tasks   Status Achieved               Plan - 09/25/14 1158    Clinical Impression Statement Pt w/ limited tolerance to P/ROM, but tolerating putty HEP issued today (yellow resistance). ? complex regional pain syndrome (chronic)   Plan continue ROM, putty ex's. ? d/c next week vs. renew        Problem List Patient Active Problem List   Diagnosis Date Noted  . Herpetic lesion 02/07/2014  . Intertriginous candidiasis 02/07/2014  . Itching 01/02/2014  . Anxiety disorder, unspecified anxiety disorder type 12/12/2013  . Depression 07/19/2013  . Distal radius fracture, right 10/20/2012  . Hordeolum 10/03/2012  . Intermittent low back pain 03/30/2012  . Swelling of finger, left 03/30/2012  . Heavy smoker (more than 20 cigarettes per day) 03/30/2012  . Health maintenance examination 03/30/2012  . Hot flashes, menopausal 02/28/2012  . Anxiety 02/28/2012  . Restless leg syndrome 02/28/2012  . Metatarsalgia of both feet 02/28/2012    Carey Bullocks, OTR/L 09/25/2014, 12:03 PM  [facility] [address] [place], [place], [address] Phone: [phone]   Fax:  [phone]

## 2014-09-25 NOTE — Patient Instructions (Addendum)
1. Grip Strengthening (Resistive Putty)   Squeeze putty using thumb and all fingers. Repeat _20___ times. Do __3-4__ sessions per day.   2. Roll putty into tube on table and pinch between each finger and thumb x 10 reps each. (can do ring and small finger together). 3-4 times per day   3. Get on hands and knees (place bowl upside down under Rt hand). Rock slowly forward and backwards placing weight on Rt arm as able. Then lift Lt arm to side and back down slowly (do approx. For up to 5 min. 2x/day)  4. Use scrub brush and scrub table and scrub up/down wall Rt arm x 5 min. 2x/day.   Copyright  VHI. All rights reserved.

## 2014-09-26 ENCOUNTER — Encounter: Payer: Self-pay | Admitting: Family Medicine

## 2014-09-26 ENCOUNTER — Ambulatory Visit (INDEPENDENT_AMBULATORY_CARE_PROVIDER_SITE_OTHER): Payer: PRIVATE HEALTH INSURANCE | Admitting: Family Medicine

## 2014-09-26 VITALS — BP 126/80 | HR 98 | Temp 97.9°F | Wt 162.0 lb

## 2014-09-26 DIAGNOSIS — G2581 Restless legs syndrome: Secondary | ICD-10-CM

## 2014-09-26 MED ORDER — LORAZEPAM 0.5 MG PO TABS: 0.5000 mg | ORAL_TABLET | Freq: Three times a day (TID) | ORAL | Status: DC | PRN

## 2014-09-26 MED ORDER — MOMETASONE FUROATE 50 MCG/ACT NA SUSP: 2.0000 | Freq: Every day | NASAL | Status: DC

## 2014-09-26 MED ORDER — GABAPENTIN 300 MG PO CAPS: 300.0000 mg | ORAL_CAPSULE | Freq: Three times a day (TID) | ORAL | Status: DC

## 2014-09-26 MED ORDER — HYDROXYZINE PAMOATE 50 MG PO CAPS: 100.0000 mg | ORAL_CAPSULE | Freq: Every evening | ORAL | Status: DC | PRN

## 2014-09-26 MED ORDER — TRAZODONE HCL 50 MG PO TABS: 25.0000 mg | ORAL_TABLET | Freq: Every evening | ORAL | Status: DC | PRN

## 2014-09-26 NOTE — Progress Notes (Signed)
   Subjective:    Patient ID: [name], female    DOB: March 14, 1956, 59 y.o.   MRN: [id number]  HPI  Ear pain: right, x2-3d after cold, no alleviating/aggravating factors, overall not worsening, pain is intermittent.  When blows nose feels pain is popping.  Also with cough, improving, reports towards end of head cold.  Insomnia: would like something for sleep, initially reported she was not taking celexa, then stated she was taking celexa.  Review of Systems  Constitutional: Negative for chills and diaphoresis.  HENT: Positive for congestion, ear pain, rhinorrhea and sore throat (resolving). Negative for dental problem, ear discharge, hearing loss, sinus pressure and tinnitus.   Respiratory: Negative for cough and shortness of breath.   Cardiovascular: Negative for leg swelling.  Gastrointestinal: Positive for constipation. Negative for abdominal pain, diarrhea and anal bleeding.  Genitourinary: Negative for dysuria, urgency, decreased urine volume and difficulty urinating.  Musculoskeletal: Positive for gait problem.  Neurological: Positive for headaches (x2-3d, now improved).       Objective:   Physical Exam  Constitutional: She appears well-developed and well-nourished. No distress.  HENT:  Head: Normocephalic and atraumatic.  Mouth/Throat: Mucous membranes are moist. Pharynx is normal.  Bilateral TM effusion L>>R, no erythema; no tonsillar exudate  Eyes: Conjunctivae are normal.  Neck: No adenopathy.  Cardiovascular: Normal rate and S2 normal.   Pulmonary/Chest: Effort normal.  Abdominal: She exhibits no distension. There is no tenderness.  Musculoskeletal: Normal range of motion.  Lymphadenopathy:    She has cervical adenopathy.  Neurological: She is alert. No cranial nerve deficit. Coordination normal.  Skin: Skin is warm. No rash noted. She is not diaphoretic. No pallor.   BP 126/80 mmHg  Pulse 98  Temp(Src) 97.9 F (36.6 C) (Oral)  Wt 162 lb (73.483  kg)        Assessment & Plan:  [name] was seen today for medication refill and ear pain  Diagnoses and associated orders for this visit:  Restless legs, neuropathy - RF: gabapentin (NEURONTIN) 300 MG capsule; Take 1 capsule (300 mg total) by mouth 3 (three) times daily. Start with 1 capsule for three days and then increase to two daily  - For bilateral otitis with effusion, no signs of infection: mometasone (NASONEX) 50 MCG/ACT nasal spray; Place 2 sprays into the nose daily.  - RF: hydrOXYzine (VISTARIL) 50 MG capsule; Take 2 capsules (100 mg total) by mouth at bedtime as needed. - RF: LORazepam (ATIVAN) 0.5 MG tablet; Take 1 tablet (0.5 mg total) by mouth every 8 (eight) hours as needed for anxiety.      =>#30 no RF, last rx was in May, no signs of abuse or regular usage at this time   - Discontinue: traZODone (DESYREL) 50 MG tablet; Take 0.5-1 tablets (25-50 mg total) by mouth at bedtime as needed for sleep. - told pt to not take, was initially ordered as pt stated she was not taking celexa, then stated she was.  Combo increased risk of QT prolongation, then vistaril refilled  Merla Riches, MD

## 2014-09-29 ENCOUNTER — Encounter: Payer: PRIVATE HEALTH INSURANCE | Admitting: Occupational Therapy

## 2014-09-30 ENCOUNTER — Other Ambulatory Visit: Payer: Self-pay | Admitting: Family Medicine

## 2014-09-30 ENCOUNTER — Encounter: Payer: PRIVATE HEALTH INSURANCE | Admitting: Occupational Therapy

## 2014-09-30 ENCOUNTER — Telehealth: Payer: Self-pay | Admitting: Family Medicine

## 2014-09-30 NOTE — Telephone Encounter (Signed)
Opened in error

## 2014-09-30 NOTE — Telephone Encounter (Signed)
Pt called and would like [place] to send her Nasonex to [facility] on [address], [place]

## 2014-10-01 ENCOUNTER — Encounter: Payer: Self-pay | Admitting: Occupational Therapy

## 2014-10-01 NOTE — Telephone Encounter (Signed)
LMOVM for pt to return call .[name] [name]  

## 2014-10-01 NOTE — Telephone Encounter (Signed)
Recently refilled by Dr. Deniece Ree.  Note to nursing staff - please call patient and have her check with her pharmacy.

## 2014-10-02 ENCOUNTER — Ambulatory Visit: Payer: PRIVATE HEALTH INSURANCE | Admitting: Occupational Therapy

## 2014-10-02 ENCOUNTER — Encounter: Payer: PRIVATE HEALTH INSURANCE | Admitting: Occupational Therapy

## 2014-10-02 DIAGNOSIS — R279 Unspecified lack of coordination: Secondary | ICD-10-CM

## 2014-10-02 DIAGNOSIS — M79641 Pain in right hand: Secondary | ICD-10-CM

## 2014-10-02 DIAGNOSIS — Z5189 Encounter for other specified aftercare: Secondary | ICD-10-CM | POA: Diagnosis not present

## 2014-10-02 DIAGNOSIS — M2560 Stiffness of unspecified joint, not elsewhere classified: Secondary | ICD-10-CM

## 2014-10-02 DIAGNOSIS — M6281 Muscle weakness (generalized): Secondary | ICD-10-CM

## 2014-10-02 NOTE — Therapy (Signed)
[facility] [address] [place], [place], [address] Phone: [phone]   Fax:  [phone]  Occupational Therapy Treatment  Patient Details  Name: [name] MRN: [id number] Date of Birth: 03-Jun-1956 Referring Provider:  Bernadene Bell, MD  Encounter Date: 10/02/2014      OT End of Session - 10/02/14 1140    Visit Number 11   Number of Visits 15   Date for OT Re-Evaluation 10/02/14   Authorization Type [employer]- 20 visit limit, 15 remaining   Authorization Time Period week 8/8   OT Start Time 1102   OT Stop Time 1145   OT Time Calculation (min) 43 min   Activity Tolerance Patient tolerated treatment well   Behavior During Therapy Anxious      Past Medical History  Diagnosis Date  . Anxiety 2009  . Arthritis 2000  . Fibroid 2003  . Hot flashes, menopausal 2012  . Allergy     seasonal  . RLS (restless legs syndrome)   . Wears glasses   . Depression   . Shortness of breath     with exertion    Past Surgical History  Procedure Laterality Date  . Neck surgery  2003    Cervical vertebroplasty   . Colonoscopy    . Open reduction internal fixation (orif) distal radial fracture Right 11/08/2012    Procedure: OPEN REDUCTION INTERNAL FIXATION (ORIF) RIGHT DISTAL RADIUS FRACTURE;  Surgeon: Linna Hoff, MD;  Location: [facility];  Service: Orthopedics;  Laterality: Right;  . Hardware removal Right 07/24/2013    Procedure: RIGHT WRIST HARDWARE REMOVAL, JOINT RELEASE, RIGHT HAND MANIPULATION UNDER ANESTHESIA;  Surgeon: Linna Hoff, MD;  Location: [facility];  Service: Orthopedics;  Laterality: Right;  . Wrist arthroscopy with debridement Right 07/21/2014    Procedure: RIGHT WRIST DISTAL RADIAL ULNAR JOINT DEBRIDEMENT AND JOINT RELEASE POSSIBLE TENDON INTERPOSITION;  Surgeon: Linna Hoff, MD;  Location: [facility];  Service: Orthopedics;  Laterality: Right;    There were  no vitals taken for this visit.  Visit Diagnosis:  Decreased range of motion  Generalized muscle weakness  Pain of right hand  Lack of coordination      Subjective Assessment - 10/02/14 1202    Currently in Pain? No/denies   Pain Score --  pt. reports pain 0-10/10  with use   Pain Location Hand   Pain Orientation Right   Pain Descriptors / Indicators Aching   Pain Type Chronic pain   Pain Onset More than a month ago   Aggravating Factors  movement   Pain Relieving Factors rest, heat                 OT Treatments/Exercises (OP) - 10/02/14 0001    Exercises   Exercises Hand  P/ROM finger flexion/ extension with in tolerated ROM,    Hand Exercises   MCPJ Flexion PROM;AROM;AAROM;Right;10 reps  A/ROM, AA/ROM wrist flex, extension to neutral    Other Hand Exercises Pt issued theraputty ex's in mass grasp and tip pinch x10 -15 reps each. Pt performed in clinic and issued for HEP   Other Hand Exercises Scrubbing exercises on table top and wall x 20 reps, for weightbearing. Weightbearing through bilateral UE's on tabletop while disengaging LUE.  UBE x 6 mins level 10 for increased UE functional use                  OT Short Term Goals -  10/02/14 1131    OT SHORT TERM GOAL #1   Title Pt will be I with HEP - 09/01/2014   Status Achieved   OT SHORT TERM GOAL #2   Title Pt will report no more than 5/10 pain in R hand with activity   Baseline not met, pt reports pain 10/10 at times with functional use   Status Not Met   OT SHORT TERM GOAL #3   Title Patient will be mod I with buttoning with AE prn   Baseline with use of butttonhook.   Status Achieved   OT SHORT TERM GOAL #4   Title Pt will be mod I with zipping with AE prn   Baseline per pt report   Status Achieved           OT Long Term Goals - 10/02/14 1130    OT LONG TERM GOAL #1   Title Pt will be I with upgraded HEP prn -09/29/2014   Baseline met for putty exercises   Status Achieved   OT  LONG TERM GOAL #2   Title Pt will be mod I with securing shoes with AE prn   Status Partially Met   OT LONG TERM GOAL #3   Title Pt will demonstrate improved coordination as evidenced by decreasing time on 9 hole peg test by 5 seconds   Baseline not met yet improved 35.10 secs   Status Not Met   OT LONG TERM GOAL #4   Title Pt will demonstrate the abiity to use RUE as gross assist during basic ADL tasks   Baseline Pt reports using but not as well as she would like to use it.   Status Partially Met               Plan - 10/02/14 1141    Clinical Impression Statement Pt progress limited by pain and stiffness. Pt reports she may undergo another surgery. Pt goals checked and therapy discontinued today due to maximal progress achieved at this time.   Rehab Potential Fair   Clinical Impairments Affecting Rehab Potential pain   OT Frequency 2x / week   OT Duration 8 weeks   OT Treatment/Interventions Self-care/ADL training;Moist Heat;Fluidtherapy;Therapeutic exercise;Manual Therapy;DME and/or AE instruction;Passive range of motion;Splinting;Therapeutic activities;Patient/family education   Plan Discharge from occupational therapy due to pt unable to progress further due to pain.   OT Home Exercise Plan putty exercises   Consulted and Agree with Plan of Care Patient        Problem List Patient Active Problem List   Diagnosis Date Noted  . Herpetic lesion 02/07/2014  . Intertriginous candidiasis 02/07/2014  . Itching 01/02/2014  . Anxiety disorder, unspecified anxiety disorder type 12/12/2013  . Depression 07/19/2013  . Distal radius fracture, right 10/20/2012  . Hordeolum 10/03/2012  . Intermittent low back pain 03/30/2012  . Swelling of finger, left 03/30/2012  . Heavy smoker (more than 20 cigarettes per day) 03/30/2012  . Health maintenance examination 03/30/2012  . Hot flashes, menopausal 02/28/2012  . Anxiety 02/28/2012  . Restless leg syndrome 02/28/2012  .  Metatarsalgia of both feet 02/28/2012    [name] 10/02/2014, 1:11 PM Theone Murdoch, OTR/L [phone] Phone: [phone] 1:11 PM 10/02/2014 [facility] [address] [place], [place], [address] Phone: [phone]   Fax:  [phone]   OCCUPATIONAL THERAPY DISCHARGE SUMMARY    Current functional level related to goals / functional outcomes: Pt met 1/4 long term goals. Pt is unable  to progress further due to pain.   Remaining deficits: Pain, decreased A/ROM, decreased P/ROM   Education / Equipment: Pt was educated regarding AE for ADLs and HEP. Pt verbalizes understanding of all education. Pt is being discharged as pt is unable to progress further due to pain.  Pt has limited insurance visits per year. Pt desires to save some visits until after her next surgery. Plan: Patient agrees to discharge.  Patient goals were not met. Patient is being discharged due to lack of progress.  ?????

## 2014-12-02 ENCOUNTER — Encounter: Payer: PRIVATE HEALTH INSURANCE | Admitting: Student

## 2014-12-22 ENCOUNTER — Other Ambulatory Visit: Payer: Self-pay | Admitting: Orthopedic Surgery

## 2014-12-22 DIAGNOSIS — Z4789 Encounter for other orthopedic aftercare: Secondary | ICD-10-CM

## 2014-12-24 ENCOUNTER — Ambulatory Visit
Admission: RE | Admit: 2014-12-24 | Discharge: 2014-12-24 | Disposition: A | Discharge status: Home / Self Care | Payer: PRIVATE HEALTH INSURANCE | Source: Ambulatory Visit | Attending: Orthopedic Surgery | Admitting: Orthopedic Surgery

## 2014-12-24 DIAGNOSIS — Z4789 Encounter for other orthopedic aftercare: Secondary | ICD-10-CM

## 2015-01-09 ENCOUNTER — Ambulatory Visit (INDEPENDENT_AMBULATORY_CARE_PROVIDER_SITE_OTHER): Payer: PRIVATE HEALTH INSURANCE | Admitting: Family Medicine

## 2015-01-09 ENCOUNTER — Telehealth: Payer: Self-pay | Admitting: Family Medicine

## 2015-01-09 ENCOUNTER — Encounter: Payer: Self-pay | Admitting: Family Medicine

## 2015-01-09 ENCOUNTER — Telehealth: Payer: Self-pay | Admitting: Student

## 2015-01-09 ENCOUNTER — Ambulatory Visit (HOSPITAL_COMMUNITY)
Admission: RE | Admit: 2015-01-09 | Discharge: 2015-01-09 | Disposition: A | Discharge status: Home / Self Care | Payer: PRIVATE HEALTH INSURANCE | Source: Ambulatory Visit | Attending: Family Medicine | Admitting: Family Medicine

## 2015-01-09 VITALS — BP 144/69 | HR 84 | Temp 97.6°F | Wt 165.0 lb

## 2015-01-09 DIAGNOSIS — F419 Anxiety disorder, unspecified: Secondary | ICD-10-CM | POA: Diagnosis not present

## 2015-01-09 DIAGNOSIS — M5136 Other intervertebral disc degeneration, lumbar region: Secondary | ICD-10-CM | POA: Insufficient documentation

## 2015-01-09 DIAGNOSIS — M545 Low back pain, unspecified: Secondary | ICD-10-CM

## 2015-01-09 HISTORY — DX: Low back pain, unspecified: M54.50

## 2015-01-09 MED ORDER — CYCLOBENZAPRINE HCL 10 MG PO TABS: 10.0000 mg | ORAL_TABLET | Freq: Three times a day (TID) | ORAL | Status: DC | PRN

## 2015-01-09 MED ORDER — LORAZEPAM 0.5 MG PO TABS
0.5000 mg | ORAL_TABLET | Freq: Three times a day (TID) | ORAL | Status: DC | PRN
Start: 2015-01-09 — End: 2015-06-08

## 2015-01-09 NOTE — Addendum Note (Signed)
Addended by: Andrena Mews T on: 01/09/2015 11:40 AM   Modules accepted: Level of Service

## 2015-01-09 NOTE — Telephone Encounter (Signed)
Dr Awanda Mink,  Patient was seen today for back pain. Xray showed DJD. She is not doing well on her percocet and will like to try steroid injection. I don't do spinal injection and she is not able to get an early appointment with her PCP. She is scheduled to see you soon, Do you do spinal injection?

## 2015-01-09 NOTE — Progress Notes (Signed)
Subjective:     Patient ID: [name], female   DOB: 1956/08/07, 59 y.o.   MRN: [id number]  Back Pain This is a new problem. The current episode started in the past 7 days (Low back back pain started 2 days ago.). The problem occurs constantly. The problem has been gradually worsening since onset. The pain is present in the lumbar spine. The quality of the pain is described as stabbing and aching. The pain does not radiate. The pain is at a severity of 10/10. The pain is moderate. The pain is the same all the time. The symptoms are aggravated by coughing, bending, twisting and position. Pertinent negatives include no abdominal pain, bladder incontinence, bowel incontinence, fever, leg pain, numbness, paresis, tingling or weakness. Risk factors: Patient was trying to put on her pant when she got up, she started having severe back pain. Treatments tried: Oxycodone. The treatment provided mild relief.  Anxiety:Requesting refill of her medication.  Current Outpatient Prescriptions on File Prior to Visit  Medication Sig Dispense Refill  . gabapentin (NEURONTIN) 300 MG capsule Take 1 capsule (300 mg total) by mouth 3 (three) times daily. Start with 1 capsule for three days and then increase to two daily 180 capsule 3  . lidocaine (XYLOCAINE) 5 % ointment Apply 1 application topically 4 (four) times daily as needed. 35.44 g 1  . oxyCODONE-acetaminophen (ROXICET) 5-325 MG per tablet Take 1 tablet by mouth every 4 (four) hours as needed for severe pain. 45 tablet 0  . vitamin C (ASCORBIC ACID) 500 MG tablet Take 1 tablet (500 mg total) by mouth daily. 50 tablet 0  . clobetasol cream (TEMOVATE) 1.60 % Apply 1 application topically 2 (two) times daily as needed.    . clotrimazole (LOTRIMIN) 1 % cream Apply 1 application topically 2 (two) times daily. 30 g 0  . diphenhydrAMINE (BENADRYL) 25 mg capsule Take 1 capsule (25 mg total) by mouth every 8 (eight) hours as needed for itching or sleep. 30 capsule 0  .  docusate sodium (COLACE) 100 MG capsule Take 1 capsule (100 mg total) by mouth 2 (two) times daily. 30 capsule 0  . hydrOXYzine (ATARAX/VISTARIL) 10 MG tablet Take 10 mg by mouth at bedtime as needed.    . hydrOXYzine (VISTARIL) 50 MG capsule Take 2 capsules (100 mg total) by mouth at bedtime as needed. 30 capsule 2  . ibuprofen (ADVIL,MOTRIN) 800 MG tablet Take 800 mg by mouth every 6 (six) hours as needed.    . mometasone (NASONEX) 50 MCG/ACT nasal spray Place 2 sprays into the nose daily. 17 g 12  . ondansetron (ZOFRAN) 4 MG tablet Take 1 tablet (4 mg total) by mouth every 8 (eight) hours as needed for nausea or vomiting. 20 tablet 0   No current facility-administered medications on file prior to visit.   Past Medical History  Diagnosis Date  . Anxiety 2009  . Arthritis 2000  . Fibroid 2003  . Hot flashes, menopausal 2012  . Allergy     seasonal  . RLS (restless legs syndrome)   . Wears glasses   . Depression   . Shortness of breath     with exertion     Review of Systems  Constitutional: Negative for fever.  Respiratory: Negative.   Cardiovascular: Negative.   Gastrointestinal: Negative.  Negative for abdominal pain and bowel incontinence.  Genitourinary: Negative for bladder incontinence.  Musculoskeletal: Positive for back pain.  Neurological: Negative for tingling, weakness and numbness.   Filed  Vitals:   01/09/15 1049  BP: 144/69  Pulse: 84  Temp: 97.6 F (36.4 C)  TempSrc: Oral  Weight: 165 lb (74.844 kg)       Objective:   Physical Exam  Constitutional: She appears well-developed. No distress.  Cardiovascular: Normal rate, regular rhythm, normal heart sounds and intact distal pulses.   No murmur heard. Pulmonary/Chest: Effort normal and breath sounds normal. No respiratory distress. She has no wheezes.  Abdominal: Soft. Bowel sounds are normal. She exhibits no distension and no mass. There is no tenderness.  Musculoskeletal:       Lumbar back: She  exhibits decreased range of motion and tenderness. She exhibits no deformity and no spasm.  + Straight leg raising test on the left.  Neurological: No cranial nerve deficit or sensory deficit. She exhibits normal muscle tone.  Nursing note and vitals reviewed.      Assessment:     Lumbar spine pain: ?? Lumbar disc herniation    Anxiety Plan:     Check problem list.

## 2015-01-09 NOTE — Telephone Encounter (Signed)
Pt called and was seen today by Dr. Gwendlyn Deutscher. She was told to make an appointment to see her pcp so that she can get a injection. The first appointment that Dr. Lincoln Brigham has in 02/03/15 and she feels that is to long to wait for her pain. She wanted to know can the doctor leaves orders for one of the nurse to do this or can she be seen by someone else. jw

## 2015-01-09 NOTE — Telephone Encounter (Signed)
Xray report discussed with patient. I recommended PT but she declined. She will schedule follow up with her PCP to discuss steroid injection.

## 2015-01-09 NOTE — Telephone Encounter (Signed)
[name],  Flexeril sent ot her pharmacy. Please cancel appointment with Dr Awanda Mink, he does not do spinal injection. Counsel patient to use her current pain meds and Flexeril as needed. See PCP soon who may refer to pain specialist if no improvement. At the time she might need an MRI.

## 2015-01-09 NOTE — Telephone Encounter (Signed)
Will forward to Dr. Gwendlyn Deutscher and Dr. Lincoln Brigham to see if they are ok with patient seeing another provider. Jazmin Hartsell,CMA

## 2015-01-09 NOTE — Telephone Encounter (Signed)
Tried to call patient and inform her of message but was unable to reach.  Unable to leave message due to full voicemail.  Will try calling again on Monday. Seleste Tallman,CMA

## 2015-01-09 NOTE — Assessment & Plan Note (Signed)
??   Disc herniation. Continue current pain regimen. Xray ordered, if no fracture or dislocation I will refer her for PT. Return precaution discussed.

## 2015-01-09 NOTE — Patient Instructions (Signed)
It was nice meeting you, I am sorry about your back pain, from the history you gave me and the exam I did, you might have disc herniation, I will get an xray of your back for this and call you with result.Continue current pain medicine, I will call if I need to refer you to PT based on your xray report. Please call soon if this is worsening.  Herniated Disk A herniated disk occurs when a disk in your spine bulges out too far. Your spine (backbone) is made up of bones called vertebrae. A disk with a spongy center is located between each pair of bones. These disks act as shock absorbers when you move. A herniated disk can cause pain and muscle weakness.  HOME CARE  Take all medicines as told by your doctor.  Rest for 2 days and then start moving.  Do not sit or stand for long periods of time.  Maintain good posture when sitting and standing.  Avoid moving in a way that causes pain, such as bending or lifting.  When you are able to start lifting things again:  [place] with your knees.  Keep your back straight.  Hold heavy objects close to your body.  If you are overweight, ask your doctor about starting a weight-loss program.  When you are able to start exercising, ask your doctor how much and what type of exercise is best for you.  Work with a physical therapist on stretching and strengthening exercises for your back.  Do not wear high-heeled shoes.  Do not sleep on your belly.  Do not smoke.  Keep all follow-up visits as told by your doctor. GET HELP IF:  You have back or neck pain that is not getting better after 4 weeks.  You have very bad pain in your back or neck.  You have a loss of feeling (numbness), tingling, or weakness along with pain. GET HELP RIGHT AWAY IF:  You have tingling, weakness, or loss of feeling that makes you unable to use your arms or legs.  You are not able to control when you pee (urinate) or poop (bowel movement).  You have dizziness or  fainting.  You have shortness of breath. MAKE SURE YOU:  Understand these instructions.  Will watch your condition.  Will get help right away if you are not doing well or get worse. Document Released: 01/13/2014 Document Reviewed: 08/02/2013 [facility] Patient Information 2015 [place], [place]. This information is not intended to replace advice given to you by your health care provider. Make sure you discuss any questions you have with your health care provider.

## 2015-01-09 NOTE — Telephone Encounter (Signed)
Nurse will not be able to do it, I don't due spinal injection. You may put her in with Nori Riis or Awanda Mink, otherwise get an appointment at [facility].

## 2015-01-09 NOTE — Telephone Encounter (Signed)
[name] said he does not do spinal injection either. Please advise patient to continue her pain medicine for now, if no improvement she can see her PCP for pain management referral. I will also send to her pharmacy flexeril to see if this will help.

## 2015-01-09 NOTE — Assessment & Plan Note (Signed)
I refilled her Ativan

## 2015-01-09 NOTE — Telephone Encounter (Signed)
Pt has an appt on 01/13/15 with Dr. Awanda Mink.  Can you CC him with your note from today so he can see what kind of injection patient would be needing?  Jazmin Hartsell,CMA

## 2015-01-10 ENCOUNTER — Telehealth: Payer: Self-pay | Admitting: Family Medicine

## 2015-01-10 NOTE — Telephone Encounter (Signed)
Patient reporting severe back pain since Wednesday. Had xrays which showed arthritis. Is taking percocet and flexeril but still not controlled. Reports pain is unbearable. Is not on any nsaids. Recommended 800 ibuprofen tid or 500 naproxen bid and eval in ED if still uncontrolled. Patient would prefer to come to ED now as she does not think over the counter pain medicine will help.

## 2015-01-11 ENCOUNTER — Emergency Department (HOSPITAL_COMMUNITY)
Admission: EM | Admit: 2015-01-11 | Discharge: 2015-01-11 | Disposition: A | Discharge status: Home / Self Care | Payer: PRIVATE HEALTH INSURANCE | Attending: Emergency Medicine | Admitting: Emergency Medicine

## 2015-01-11 ENCOUNTER — Encounter (HOSPITAL_COMMUNITY): Payer: Self-pay

## 2015-01-11 DIAGNOSIS — Z79899 Other long term (current) drug therapy: Secondary | ICD-10-CM | POA: Insufficient documentation

## 2015-01-11 DIAGNOSIS — Z978 Presence of other specified devices: Secondary | ICD-10-CM | POA: Insufficient documentation

## 2015-01-11 DIAGNOSIS — F419 Anxiety disorder, unspecified: Secondary | ICD-10-CM | POA: Diagnosis not present

## 2015-01-11 DIAGNOSIS — Z8742 Personal history of other diseases of the female genital tract: Secondary | ICD-10-CM | POA: Insufficient documentation

## 2015-01-11 DIAGNOSIS — M545 Low back pain, unspecified: Secondary | ICD-10-CM

## 2015-01-11 DIAGNOSIS — Z8669 Personal history of other diseases of the nervous system and sense organs: Secondary | ICD-10-CM | POA: Diagnosis not present

## 2015-01-11 DIAGNOSIS — Z720 Tobacco use: Secondary | ICD-10-CM | POA: Diagnosis not present

## 2015-01-11 DIAGNOSIS — Z7951 Long term (current) use of inhaled steroids: Secondary | ICD-10-CM | POA: Diagnosis not present

## 2015-01-11 MED ORDER — CYCLOBENZAPRINE HCL 10 MG PO TABS: 10.0000 mg | ORAL_TABLET | Freq: Two times a day (BID) | ORAL | Status: DC | PRN

## 2015-01-11 MED ORDER — KETOROLAC TROMETHAMINE 60 MG/2ML IM SOLN
60.0000 mg | Freq: Once | INTRAMUSCULAR | Status: AC
  Administered 2015-01-11: 60 mg via INTRAMUSCULAR
  Filled 2015-01-11: qty 2

## 2015-01-11 MED ORDER — MELOXICAM 15 MG PO TABS: 15.0000 mg | ORAL_TABLET | Freq: Every day | ORAL | Status: DC

## 2015-01-11 MED ORDER — DIAZEPAM 5 MG/ML IJ SOLN
5.0000 mg | Freq: Once | INTRAMUSCULAR | Status: AC
  Administered 2015-01-11: 5 mg via INTRAMUSCULAR
  Filled 2015-01-11: qty 2

## 2015-01-11 NOTE — ED Notes (Signed)
Per [facility], pt here for lower back pain for the past 5 months but it suddenly got worse today when she moved the wrong way. Has an appt with PCP on Tuesday but just couldn't wait that long. Pain is 10/10.

## 2015-01-11 NOTE — ED Provider Notes (Signed)
CSN: [id number]     Arrival date & time 01/11/15  1829 History  This chart was scribed for non-physician provider Alvina Chou, PA-C, working with Wandra Arthurs, MD by Irene Pap, ED Scribe. This patient was seen in room TR05C/TR05C and patient care was started at 7:12 PM.   Chief Complaint  Patient presents with  . Back Pain   Patient is a 59 y.o. female presenting with back pain. The history is provided by the patient. No language interpreter was used.  Back Pain HPI Comments: [name] is a 59 y.o. female with a history of anxiety who presents to the Emergency Department complaining of lower back pain onset 5 months ago. She states that the pain worsened for the past three days when she moved the wrong way. She states taking Oxycodone for the pain to no relief. She states that she has used ice and a heating pad to the area to no relief. She states that she also takes pain medication for RLS. She reports associated anxiety attacks when the pain is severe. She states that she was told that she needed to receive a cortisone shot for the pain but states that she could not wait because the pain was so severe. She denies radiating pain to her legs, numbness or tingling to the legs. She denies injury to the area.   Past Medical History  Diagnosis Date  . Anxiety 2009  . Arthritis 2000  . Fibroid 2003  . Hot flashes, menopausal 2012  . Allergy     seasonal  . RLS (restless legs syndrome)   . Wears glasses   . Depression   . Shortness of breath     with exertion   Past Surgical History  Procedure Laterality Date  . Neck surgery  2003    Cervical vertebroplasty   . Colonoscopy    . Open reduction internal fixation (orif) distal radial fracture Right 11/08/2012    Procedure: OPEN REDUCTION INTERNAL FIXATION (ORIF) RIGHT DISTAL RADIUS FRACTURE;  Surgeon: Linna Hoff, MD;  Location: [place];  Service: Orthopedics;  Laterality: Right;  . Hardware removal Right  07/24/2013    Procedure: RIGHT WRIST HARDWARE REMOVAL, JOINT RELEASE, RIGHT HAND MANIPULATION UNDER ANESTHESIA;  Surgeon: Linna Hoff, MD;  Location: [place];  Service: Orthopedics;  Laterality: Right;  . Wrist arthroscopy with debridement Right 07/21/2014    Procedure: RIGHT WRIST DISTAL RADIAL ULNAR JOINT DEBRIDEMENT AND JOINT RELEASE POSSIBLE TENDON INTERPOSITION;  Surgeon: Linna Hoff, MD;  Location: [place];  Service: Orthopedics;  Laterality: Right;   Family History  Problem Relation Age of Onset  . Hypertension Mother   . Hyperlipidemia Mother   . Early death Sister 43    Drug overdose  . Dementia Father   . Cancer Father     Throat cancer   . Hypertension Daughter    History  Substance Use Topics  . Smoking status: Current Every Day Smoker -- 1.00 packs/day for 41 years    Types: Cigarettes  . Smokeless tobacco: Never Used  . Alcohol Use: Yes     Comment: Holidays only   OB History    Gravida Para Term Preterm AB TAB SAB Ectopic Multiple Living   '2 2 2       2     '$ Review of Systems  Musculoskeletal: Positive for back pain.  All other systems reviewed and are negative.   Allergies  Codeine  Home Medications  Prior to Admission medications   Medication Sig Start Date End Date Taking? Authorizing Provider  clobetasol cream (TEMOVATE) 5.05 % Apply 1 application topically 2 (two) times daily as needed.    Historical Provider, MD  clotrimazole (LOTRIMIN) 1 % cream Apply 1 application topically 2 (two) times daily. 02/07/14   Josalyn Funches, MD  cyclobenzaprine (FLEXERIL) 10 MG tablet Take 1 tablet (10 mg total) by mouth 3 (three) times daily as needed for muscle spasms. 01/09/15   Kinnie Feil, MD  diphenhydrAMINE (BENADRYL) 25 mg capsule Take 1 capsule (25 mg total) by mouth every 8 (eight) hours as needed for itching or sleep. 02/07/14   Josalyn Funches, MD  docusate sodium (COLACE) 100 MG capsule Take 1 capsule (100 mg total) by mouth 2 (two)  times daily. 07/21/14   Iran Planas, MD  gabapentin (NEURONTIN) 300 MG capsule Take 1 capsule (300 mg total) by mouth 3 (three) times daily. Start with 1 capsule for three days and then increase to two daily 09/26/14 09/26/15  Nila Nephew, MD  hydrOXYzine (ATARAX/VISTARIL) 10 MG tablet Take 10 mg by mouth at bedtime as needed.    Historical Provider, MD  hydrOXYzine (VISTARIL) 50 MG capsule Take 2 capsules (100 mg total) by mouth at bedtime as needed. 09/26/14   Nila Nephew, MD  ibuprofen (ADVIL,MOTRIN) 800 MG tablet Take 800 mg by mouth every 6 (six) hours as needed.    Historical Provider, MD  lidocaine (XYLOCAINE) 5 % ointment Apply 1 application topically 4 (four) times daily as needed. 02/10/14   Josalyn Funches, MD  LORazepam (ATIVAN) 0.5 MG tablet Take 1 tablet (0.5 mg total) by mouth every 8 (eight) hours as needed for anxiety. 01/09/15   Kinnie Feil, MD  mometasone (NASONEX) 50 MCG/ACT nasal spray Place 2 sprays into the nose daily. 09/26/14   Nila Nephew, MD  ondansetron (ZOFRAN) 4 MG tablet Take 1 tablet (4 mg total) by mouth every 8 (eight) hours as needed for nausea or vomiting. 07/21/14   Iran Planas, MD  oxyCODONE-acetaminophen (ROXICET) 5-325 MG per tablet Take 1 tablet by mouth every 4 (four) hours as needed for severe pain. 07/21/14   Iran Planas, MD  vitamin C (ASCORBIC ACID) 500 MG tablet Take 1 tablet (500 mg total) by mouth daily. 07/21/14   Iran Planas, MD   BP 102/65 mmHg  Pulse 88  Temp(Src) 97.2 F (36.2 C) (Oral)  Resp 20  SpO2 98%   Physical Exam  Constitutional: She is oriented to person, place, and time. She appears well-developed and well-nourished. No distress.  HENT:  Head: Normocephalic and atraumatic.  Mouth/Throat: Oropharynx is clear and moist.  Eyes: Conjunctivae and EOM are normal.  Neck: Normal range of motion. Neck supple.  Cardiovascular: Normal rate, regular rhythm and normal heart sounds.   Pulmonary/Chest: Effort normal and breath sounds  normal. No respiratory distress.  Abdominal: Soft. She exhibits no distension. There is no tenderness. There is no rebound.  Musculoskeletal: Normal range of motion. She exhibits no edema.  No midline spine tenderness;   Neurological: She is alert and oriented to person, place, and time. No sensory deficit. Coordination normal.  lower extremity sensation and strength equal and intact  Skin: Skin is warm and dry.  Psychiatric: She has a normal mood and affect. Her behavior is normal.  Nursing note and vitals reviewed.   ED Course  Procedures (including critical care time) DIAGNOSTIC STUDIES: Oxygen Saturation is 98% on room air, normal by my interpretation.  COORDINATION OF CARE: 7:17 PM-Discussed treatment plan which includes pain medication with pt at bedside and pt agreed to plan.   Labs Review Labs Reviewed - No data to display  Imaging Review No results found.   EKG Interpretation None      MDM   Final diagnoses:  Midline low back pain without sciatica    8:33 PM Patient with a history of herniated disc having back pain. Patient reports improvement of the pain with toradol and valium. Patient will be discharge with mobic and flexeril. Patient has PCP follow up in 2 days. No bladder/bowel incontinence or saddle paresthesias.   I personally performed the services described in this documentation, which was scribed in my presence. The recorded information has been reviewed and is accurate.    Alvina Chou, PA-C 01/11/15 2035  Wandra Arthurs, MD 01/12/15 [phone]

## 2015-01-11 NOTE — Discharge Instructions (Signed)
Take mobic as needed for pain. Take flexeril as needed for muscle spasm. Refer to attached documents for more information.

## 2015-01-12 NOTE — Telephone Encounter (Signed)
Spoke with patient and she is aware of [place] cancelling this appt with Dr. Awanda Mink.  She states that the pain did worsen and had to seek care in the ED.  She had to call 911 because she couldn't stand.  Patient would like to see PCP but there were no appointments until the end of the month.  Patient was scheduled to see Dr. Gwendlyn Deutscher again so if she needs a referral to pain management we can go ahead and get it in motion.  Patient voiced understanding on appt for 01/16/15 at 10:15am and will pick up her prescription to see if she can get some relief.  Zimere Dunlevy,CMA

## 2015-01-13 ENCOUNTER — Ambulatory Visit: Payer: PRIVATE HEALTH INSURANCE | Admitting: Family Medicine

## 2015-01-15 ENCOUNTER — Telehealth: Payer: Self-pay | Admitting: Family Medicine

## 2015-01-15 DIAGNOSIS — M47896 Other spondylosis, lumbar region: Secondary | ICD-10-CM

## 2015-01-15 NOTE — Telephone Encounter (Signed)
I called to see how patient is doing with her pain. She is still in pain with no major improvement with her pain medicine and Flexeril. She still does not want to be referred to PT, she will like referral to pain management clinic. Referral placed today.

## 2015-01-16 ENCOUNTER — Ambulatory Visit (INDEPENDENT_AMBULATORY_CARE_PROVIDER_SITE_OTHER): Payer: PRIVATE HEALTH INSURANCE | Admitting: Family Medicine

## 2015-01-16 ENCOUNTER — Encounter: Payer: Self-pay | Admitting: Family Medicine

## 2015-01-16 VITALS — BP 111/81 | HR 95 | Temp 97.6°F | Ht 68.0 in | Wt 165.4 lb

## 2015-01-16 DIAGNOSIS — M545 Low back pain, unspecified: Secondary | ICD-10-CM

## 2015-01-16 NOTE — Assessment & Plan Note (Signed)
Xray reviewed and discussed with patient. She has Mild to moderate DJD. Seems to be improving gradually. Continue home exercise, she declined PT referral. Continue Oxycodone and felxeril prn. She will like pain clinic referral since her pain meds does not take pain away completely. Referral to pain clinic ordered yesterday. She is advised to cancel appointment if pain resolves completely. Return precaution discussed.

## 2015-01-16 NOTE — Patient Instructions (Signed)
  It was nice seeing you today Ms Schoenbeck, I am glad your pain is getting better. I have referred you to pain clinic,they might recommend injection. Also continue your percocet and the flexeril. Call if symptom does not get better or worsening. See your PCP soon.  Back Exercises Back exercises help treat and prevent back injuries. The goal is to increase your strength in your belly (abdominal) and back muscles. These exercises can also help with flexibility. Start these exercises when told by your doctor. HOME CARE Back exercises include: Pelvic Tilt.  Lie on your back with your knees bent. Tilt your pelvis until the lower part of your back is against the floor. Hold this position 5 to 10 sec. Repeat this exercise 5 to 10 times. Knee to Chest.  Pull 1 knee up against your chest and hold for 20 to 30 seconds. Repeat this with the other knee. This may be done with the other leg straight or bent, whichever feels better. Then, pull both knees up against your chest. Sit-Ups or Curl-Ups.  Bend your knees 90 degrees. Start with tilting your pelvis, and do a partial, slow sit-up. Only lift your upper half 30 to 45 degrees off the floor. Take at least 2 to 3 seonds for each sit-up. Do not do sit-ups with your knees out straight. If partial sit-ups are difficult, simply do the above but with only tightening your belly (abdominal) muscles and holding it as told. Hip-Lift.  Lie on your back with your knees flexed 90 degrees. Push down with your feet and shoulders as you raise your hips 2 inches off the floor. Hold for 10 seconds, repeat 5 to 10 times. Back Arches.  Lie on your stomach. Prop yourself up on bent elbows. Slowly press on your hands, causing an arch in your low back. Repeat 3 to 5 times. Shoulder-Lifts.  Lie face down with arms beside your body. Keep hips and belly pressed to floor as you slowly lift your head and shoulders off the floor. Do not overdo your exercises. Be careful in the  beginning. Exercises may cause you some mild back discomfort. If the pain lasts for more than 15 minutes, stop the exercises until you see your doctor. Improvement with exercise for back problems is slow.  Document Released: 10/01/2010 Document Revised: 11/21/2011 Document Reviewed: 06/30/2011 [facility] Patient Information 2015 [place], [place]. This information is not intended to replace advice given to you by your health care provider. Make sure you discuss any questions you have with your health care provider.

## 2015-01-16 NOTE — Progress Notes (Signed)
Subjective:     Patient ID: [name], female   DOB: 1956/06/26, 59 y.o.   MRN: [id number]  Back Pain This is a recurrent problem. The current episode started in the past 7 days. The problem has been waxing and waning since onset. The pain is present in the lumbar spine. The pain does not radiate. The pain is at a severity of 5/10. The pain is moderate. The pain is the same all the time. The symptoms are aggravated by sitting, bending and position. Pertinent negatives include no bladder incontinence, bowel incontinence, paresthesias, tingling or weakness. (Stiffness of her back pain on and off) She has tried heat and muscle relaxant (Oxycodone) for the symptoms. The treatment provided moderate relief.   Current Outpatient Prescriptions on File Prior to Visit  Medication Sig Dispense Refill  . oxyCODONE-acetaminophen (ROXICET) 5-325 MG per tablet Take 1 tablet by mouth every 4 (four) hours as needed for severe pain. 45 tablet 0  . clobetasol cream (TEMOVATE) 4.33 % Apply 1 application topically 2 (two) times daily as needed.    . clotrimazole (LOTRIMIN) 1 % cream Apply 1 application topically 2 (two) times daily. 30 g 0  . cyclobenzaprine (FLEXERIL) 10 MG tablet Take 1 tablet (10 mg total) by mouth 2 (two) times daily as needed for muscle spasms. 20 tablet 0  . diphenhydrAMINE (BENADRYL) 25 mg capsule Take 1 capsule (25 mg total) by mouth every 8 (eight) hours as needed for itching or sleep. 30 capsule 0  . docusate sodium (COLACE) 100 MG capsule Take 1 capsule (100 mg total) by mouth 2 (two) times daily. 30 capsule 0  . gabapentin (NEURONTIN) 300 MG capsule Take 1 capsule (300 mg total) by mouth 3 (three) times daily. Start with 1 capsule for three days and then increase to two daily 180 capsule 3  . hydrOXYzine (ATARAX/VISTARIL) 10 MG tablet Take 10 mg by mouth at bedtime as needed.    . hydrOXYzine (VISTARIL) 50 MG capsule Take 2 capsules (100 mg total) by mouth at bedtime as needed. 30 capsule  2  . ibuprofen (ADVIL,MOTRIN) 800 MG tablet Take 800 mg by mouth every 6 (six) hours as needed.    . lidocaine (XYLOCAINE) 5 % ointment Apply 1 application topically 4 (four) times daily as needed. 35.44 g 1  . LORazepam (ATIVAN) 0.5 MG tablet Take 1 tablet (0.5 mg total) by mouth every 8 (eight) hours as needed for anxiety. 15 tablet 0  . meloxicam (MOBIC) 15 MG tablet Take 1 tablet (15 mg total) by mouth daily. 20 tablet 0  . mometasone (NASONEX) 50 MCG/ACT nasal spray Place 2 sprays into the nose daily. 17 g 12  . ondansetron (ZOFRAN) 4 MG tablet Take 1 tablet (4 mg total) by mouth every 8 (eight) hours as needed for nausea or vomiting. 20 tablet 0  . vitamin C (ASCORBIC ACID) 500 MG tablet Take 1 tablet (500 mg total) by mouth daily. 50 tablet 0   No current facility-administered medications on file prior to visit.   Past Medical History  Diagnosis Date  . Anxiety 2009  . Arthritis 2000  . Fibroid 2003  . Hot flashes, menopausal 2012  . Allergy     seasonal  . RLS (restless legs syndrome)   . Wears glasses   . Depression   . Shortness of breath     with exertion     Review of Systems  Respiratory: Negative.   Cardiovascular: Negative.   Gastrointestinal: Negative.  Negative  for bowel incontinence.  Genitourinary: Negative for bladder incontinence.  Musculoskeletal: Positive for back pain. Negative for joint swelling and gait problem.  Neurological: Negative for tingling, weakness and paresthesias.  All other systems reviewed and are negative.  Filed Vitals:   01/16/15 1017  BP: 111/81  Pulse: 95  Temp: 97.6 F (36.4 C)  TempSrc: Oral  Height: '5\' 8"'$  (1.727 m)  Weight: 165 lb 7 oz (75.042 kg)        Objective:   Physical Exam  Constitutional: She appears well-developed. No distress.  Cardiovascular: Normal rate, regular rhythm, normal heart sounds and intact distal pulses.   No murmur heard. Pulmonary/Chest: Effort normal and breath sounds normal. No  respiratory distress. She has no wheezes.  Abdominal: Soft. Bowel sounds are normal. She exhibits no distension and no mass. There is no tenderness.  Musculoskeletal:       Lumbar back: She exhibits decreased range of motion and tenderness. She exhibits no swelling, no edema, no deformity and no spasm.  Neurological: She has normal strength. No cranial nerve deficit or sensory deficit.  Nursing note and vitals reviewed.      Assessment:     Lumbar spine pain     Plan:     Check problem list.

## 2015-01-27 ENCOUNTER — Telehealth: Payer: Self-pay | Admitting: Student

## 2015-01-27 NOTE — Telephone Encounter (Signed)
Pt called because she needs refills on her Flexeril and Mobic. jw

## 2015-01-27 NOTE — Telephone Encounter (Signed)
Refill request from pt. Will forward to PCP and MD covering for PCP for review. Tyffani Foglesong, CMA.

## 2015-01-28 MED ORDER — CYCLOBENZAPRINE HCL 10 MG PO TABS: 10.0000 mg | ORAL_TABLET | Freq: Two times a day (BID) | ORAL | Status: DC | PRN

## 2015-01-28 MED ORDER — MELOXICAM 15 MG PO TABS: 15.0000 mg | ORAL_TABLET | Freq: Every day | ORAL | Status: DC

## 2015-01-28 NOTE — Telephone Encounter (Signed)
10 days of flexeril and mobic prescribed.  Patient is awaiting pain clinic referral, so will defer future refills to PCP or pain clinic.  Virginia Crews, MD, MPH PGY-1,  [facility] 01/28/2015 2:19 PM

## 2015-01-28 NOTE — Telephone Encounter (Signed)
LM for patient to call back.  Please inform of this when she calls back. Gareth Fitzner,CMA

## 2015-02-10 ENCOUNTER — Ambulatory Visit (INDEPENDENT_AMBULATORY_CARE_PROVIDER_SITE_OTHER): Payer: Self-pay | Admitting: Family Medicine

## 2015-02-10 ENCOUNTER — Encounter: Payer: Self-pay | Admitting: Family Medicine

## 2015-02-10 VITALS — BP 117/81 | HR 100 | Temp 97.7°F | Ht 68.0 in | Wt 161.3 lb

## 2015-02-10 DIAGNOSIS — R609 Edema, unspecified: Secondary | ICD-10-CM

## 2015-02-10 DIAGNOSIS — B009 Herpesviral infection, unspecified: Secondary | ICD-10-CM

## 2015-02-10 DIAGNOSIS — R42 Dizziness and giddiness: Secondary | ICD-10-CM

## 2015-02-10 LAB — CBC
HCT: 41.6 % (ref 36.0–46.0)
Hemoglobin: 13.7 g/dL (ref 12.0–15.0)
MCH: 27.7 pg (ref 26.0–34.0)
MCHC: 32.9 g/dL (ref 30.0–36.0)
MCV: 84.2 fL (ref 78.0–100.0)
MPV: 9.1 fL (ref 8.6–12.4)
Platelets: 457 10*3/uL — ABNORMAL HIGH (ref 150–400)
RBC: 4.94 MIL/uL (ref 3.87–5.11)
RDW: 13.8 % (ref 11.5–15.5)
WBC: 11.3 10*3/uL — ABNORMAL HIGH (ref 4.0–10.5)

## 2015-02-10 MED ORDER — LORATADINE 10 MG PO TABS: 10.0000 mg | ORAL_TABLET | Freq: Every day | ORAL | Status: DC

## 2015-02-10 MED ORDER — CARBAMIDE PEROXIDE 6.5 % OT SOLN: 5.0000 [drp] | Freq: Two times a day (BID) | OTIC | Status: DC

## 2015-02-10 MED ORDER — LIDOCAINE 5 % EX OINT: 1.0000 "application " | TOPICAL_OINTMENT | Freq: Four times a day (QID) | CUTANEOUS | Status: DC | PRN

## 2015-02-10 NOTE — Patient Instructions (Signed)
Thank you so much for coming to visit me today! I am not sure what is causing your dizziness. You had some fluids behind both of your ears in January, which may cause dizziness if not resolved. I was unable to visualize your ear drums today. I have sent in prescriptions for Claritin and Debrox (ear drops) for suspected effusion (fluid on the ear). I am hoping when you return we will be able to visualize your ear drums better! Please continue to take the Nasonex daily! Please write down when you have any episodes, what you were doing when it occurred, and what you experienced.  We will check some basic lab work today to make sure there is nothing else that can explain your dizziness. If you do not get better on the above plan, I will consider referring you to ENT.  I have sent in a refill of the Lidocaine cream as requested.  Please follow up in 4 weeks.  Thanks again! Dr. Gerlean Ren

## 2015-02-11 DIAGNOSIS — R609 Edema, unspecified: Secondary | ICD-10-CM | POA: Insufficient documentation

## 2015-02-11 DIAGNOSIS — R42 Dizziness and giddiness: Secondary | ICD-10-CM | POA: Insufficient documentation

## 2015-02-11 LAB — BASIC METABOLIC PANEL
BUN: 21 mg/dL (ref 6–23)
CALCIUM: 10 mg/dL (ref 8.4–10.5)
CO2: 27 meq/L (ref 19–32)
CREATININE: 0.95 mg/dL (ref 0.50–1.10)
Chloride: 104 mEq/L (ref 96–112)
GLUCOSE: 88 mg/dL (ref 70–99)
Potassium: 4.6 mEq/L (ref 3.5–5.3)
SODIUM: 140 meq/L (ref 135–145)

## 2015-02-11 LAB — TSH: TSH: 2.523 u[IU]/mL (ref 0.350–4.500)

## 2015-02-11 NOTE — Assessment & Plan Note (Addendum)
-   Suspected to be secondary to Bilateral Effusion. Effusion noted on otologic exam in January 2016 L>R - Unable to adequately evaluate tympanic membranes. Attempted to remove cerumen, but patient unable to tolerate. Will prescribe Debrox and recommended following up in 3-4 weeks to re-examine ears. - Refilled Nasonex and encouraged use. Prescription given for Claritin. Demonstrated massage for home to aid with draining. - BMP WNL - CBC with mildly elevated WBC to 11.3, Platelets to 457 - TSH WNL - Follow up in 3-4 weeks

## 2015-02-11 NOTE — Progress Notes (Signed)
Subjective:     Patient ID: [name], female   DOB: [birth year], 59 y.o.   MRN: [id number]  HPI [name] is a 59yo female presenting today for one year history of intermittent "dizziness." - Describes dizziness as being lightheaded and feeling like she is drunk. She often looses her balance and falls during these episodes. - Most recent episode on Saturday 5/28. She fell during this episode and skinned her left knee and decided to come in for office visit - Episodes do not occur with changes in position, turning of head, rolling over in bed - Also noted left ear pain that occurred with episode on 5/28 - Notes family history of inner ear problem in mother and aunt. Unsure of what problem is. Vertigo sounds familiar but not sure. - Denies hearing loss - Denies any episodes of syncope - Noted to have bilateral effusions of both ears worse on left on January. Was prescribed Nasonex, but has not been using this. Needs refill. - Denies chest pain, chest palpitations, shortness of breath - Admits to swelling in legs bilaterally. Gets worse at end of day, improved with elevation of legs. - Denies orthopnea, paroxysmal nocturnal dyspnea  Review of Systems  HENT: Positive for ear pain. Negative for hearing loss.   Respiratory: Negative for shortness of breath.   Cardiovascular: Positive for leg swelling. Negative for chest pain and palpitations.  Neurological: Positive for dizziness.       Objective:   Physical Exam  Constitutional: She appears well-developed and well-nourished. No distress.  HENT:  Unable to visualize right tympanic membrane due to cerumen. Able to visualize very small portion of left tympanic membrane, which was not noted to be erythematous but was mostly concealed by cerumen.  Cardiovascular: Normal rate and regular rhythm.  Exam reveals no gallop and no friction rub.   No murmur heard. Pulmonary/Chest: Effort normal. No respiratory distress. She has no wheezes. She  has no rales.  Abdominal: Soft. She exhibits no distension. There is no tenderness.  Musculoskeletal: She exhibits edema.  Skin:  Small dried scab noted on left knee and right lower leg.  Psychiatric: She has a normal mood and affect. Her behavior is normal.       Assessment:     Please refer to Problem List for Assessment.    Plan:     Please refer to Problem List for Plan.

## 2015-02-11 NOTE — Assessment & Plan Note (Signed)
-   Denies CP, SOB, orthopnea, paroxysmal nocturnal dyspnea - Suspect secondary to venous insufficiency - Recommended compression stockings

## 2015-02-12 NOTE — Progress Notes (Signed)
I was the preceptor on the day of this visit.   Keyaan Lederman MD  

## 2015-02-17 ENCOUNTER — Encounter: Payer: Self-pay | Admitting: Family Medicine

## 2015-02-17 ENCOUNTER — Telehealth: Payer: Self-pay | Admitting: Family Medicine

## 2015-02-17 NOTE — Telephone Encounter (Signed)
Attempted to contact via phone to discuss lab results without success x3. Will mail results.  Dr. Gerlean Ren

## 2015-03-22 ENCOUNTER — Emergency Department (INDEPENDENT_AMBULATORY_CARE_PROVIDER_SITE_OTHER)
Admission: EM | Admit: 2015-03-22 | Discharge: 2015-03-22 | Disposition: A | Discharge status: Home / Self Care | Payer: Self-pay | Source: Home / Self Care | Attending: Emergency Medicine | Admitting: Emergency Medicine

## 2015-03-22 ENCOUNTER — Emergency Department (INDEPENDENT_AMBULATORY_CARE_PROVIDER_SITE_OTHER): Payer: Medicaid Other

## 2015-03-22 ENCOUNTER — Encounter (HOSPITAL_COMMUNITY): Payer: Self-pay | Admitting: Emergency Medicine

## 2015-03-22 DIAGNOSIS — S1093XA Contusion of unspecified part of neck, initial encounter: Secondary | ICD-10-CM

## 2015-03-22 DIAGNOSIS — S60221A Contusion of right hand, initial encounter: Secondary | ICD-10-CM | POA: Diagnosis not present

## 2015-03-22 MED ORDER — HYDROCODONE-ACETAMINOPHEN 5-325 MG PO TABS: 2.0000 | ORAL_TABLET | ORAL | Status: DC | PRN

## 2015-03-22 NOTE — Discharge Instructions (Signed)
Contusion °A contusion is a deep bruise. Contusions are the result of an injury that caused bleeding under the skin. The contusion may turn blue, purple, or yellow. Minor injuries will give you a painless contusion, but more severe contusions may stay painful and swollen for a few weeks.  °CAUSES  °A contusion is usually caused by a blow, trauma, or direct force to an area of the body. °SYMPTOMS  °· Swelling and redness of the injured area. °· Bruising of the injured area. °· Tenderness and soreness of the injured area. °· Pain. °DIAGNOSIS  °The diagnosis can be made by taking a history and physical exam. An X-ray, CT scan, or MRI may be needed to determine if there were any associated injuries, such as fractures. °TREATMENT  °Specific treatment will depend on what area of the body was injured. In general, the best treatment for a contusion is resting, icing, elevating, and applying cold compresses to the injured area. Over-the-counter medicines may also be recommended for pain control. Ask your caregiver what the best treatment is for your contusion. °HOME CARE INSTRUCTIONS  °· Put ice on the injured area. °¨ Put ice in a plastic bag. °¨ Place a towel between your skin and the bag. °¨ Leave the ice on for 15-20 minutes, 3-4 times a day, or as directed by your health care provider. °· Only take over-the-counter or prescription medicines for pain, discomfort, or fever as directed by your caregiver. Your caregiver may recommend avoiding anti-inflammatory medicines (aspirin, ibuprofen, and naproxen) for 48 hours because these medicines may increase bruising. °· Rest the injured area. °· If possible, elevate the injured area to reduce swelling. °SEEK IMMEDIATE MEDICAL CARE IF:  °· You have increased bruising or swelling. °· You have pain that is getting worse. °· Your swelling or pain is not relieved with medicines. °MAKE SURE YOU:  °· Understand these instructions. °· Will watch your condition. °· Will get help right  away if you are not doing well or get worse. °Document Released: 06/08/2005 Document Revised: 09/03/2013 Document Reviewed: 07/04/2011 °ExitCare® Patient Information ©2015 [employer]. This information is not intended to replace advice given to you by your health care provider. Make sure you discuss any questions you have with your health care provider. ° °

## 2015-03-22 NOTE — ED Notes (Signed)
Pt comes in with c/o right arm pain radiating to shoulder and neck pain s/p fall in [employer], water was in aisle without caution sign Denies head injury or bruising

## 2015-03-22 NOTE — ED Provider Notes (Signed)
CSN: [id number]     Arrival date & time 03/22/15  1734 History   First MD Initiated Contact with Patient 03/22/15 1922     Chief Complaint  Patient presents with  . Fall   (Consider location/radiation/quality/duration/timing/severity/associated sxs/prior Treatment) Patient is a 59 y.o. female presenting with fall. The history is provided by the patient. No language interpreter was used.  Fall This is a new problem. The current episode started today. The problem occurs constantly. The problem has been gradually worsening. Associated symptoms include neck pain. Nothing aggravates the symptoms. She has tried nothing for the symptoms. The treatment provided moderate relief.  Pt reports she fell in grocery store.  Pt complains of pain in her neck and right shoulder. No loc  Past Medical History  Diagnosis Date  . Anxiety 2009  . Arthritis 2000  . Fibroid 2003  . Hot flashes, menopausal 2012  . Allergy     seasonal  . RLS (restless legs syndrome)   . Wears glasses   . Depression   . Shortness of breath     with exertion   Past Surgical History  Procedure Laterality Date  . Neck surgery  2003    Cervical vertebroplasty   . Colonoscopy    . Open reduction internal fixation (orif) distal radial fracture Right 11/08/2012    Procedure: OPEN REDUCTION INTERNAL FIXATION (ORIF) RIGHT DISTAL RADIUS FRACTURE;  Surgeon: Linna Hoff, MD;  Location: [facility];  Service: Orthopedics;  Laterality: Right;  . Hardware removal Right 07/24/2013    Procedure: RIGHT WRIST HARDWARE REMOVAL, JOINT RELEASE, RIGHT HAND MANIPULATION UNDER ANESTHESIA;  Surgeon: Linna Hoff, MD;  Location: [facility];  Service: Orthopedics;  Laterality: Right;  . Wrist arthroscopy with debridement Right 07/21/2014    Procedure: RIGHT WRIST DISTAL RADIAL ULNAR JOINT DEBRIDEMENT AND JOINT RELEASE POSSIBLE TENDON INTERPOSITION;  Surgeon: Linna Hoff, MD;  Location: [facility];  Service: Orthopedics;   Laterality: Right;   Family History  Problem Relation Age of Onset  . Hypertension Mother   . Hyperlipidemia Mother   . Early death Sister 47    Drug overdose  . Dementia Father   . Cancer Father     Throat cancer   . Hypertension Daughter    History  Substance Use Topics  . Smoking status: Current Every Day Smoker -- 1.00 packs/day for 41 years    Types: Cigarettes  . Smokeless tobacco: Never Used  . Alcohol Use: Yes     Comment: Holidays only   OB History    Gravida Para Term Preterm AB TAB SAB Ectopic Multiple Living   '2 2 2       2     '$ Review of Systems  Musculoskeletal: Positive for neck pain.  All other systems reviewed and are negative.   Allergies  Codeine  Home Medications   Prior to Admission medications   Medication Sig Start Date End Date Taking? Authorizing Provider  carbamide peroxide (DEBROX) 6.5 % otic solution Place 5 drops into both ears 2 (two) times daily. 02/10/15   [place] N Rumley, DO  clobetasol cream (TEMOVATE) 1.85 % Apply 1 application topically 2 (two) times daily as needed.    Historical Provider, MD  clotrimazole (LOTRIMIN) 1 % cream Apply 1 application topically 2 (two) times daily. 02/07/14   Josalyn Funches, MD  cyclobenzaprine (FLEXERIL) 10 MG tablet Take 1 tablet (10 mg total) by mouth 2 (two) times daily as needed for muscle spasms. 01/28/15  Virginia Crews, MD  docusate sodium (COLACE) 100 MG capsule Take 1 capsule (100 mg total) by mouth 2 (two) times daily. 07/21/14   Iran Planas, MD  gabapentin (NEURONTIN) 300 MG capsule Take 1 capsule (300 mg total) by mouth 3 (three) times daily. Start with 1 capsule for three days and then increase to two daily 09/26/14 09/26/15  Nila Nephew, MD  hydrOXYzine (VISTARIL) 50 MG capsule Take 2 capsules (100 mg total) by mouth at bedtime as needed. 09/26/14   Nila Nephew, MD  lidocaine (XYLOCAINE) 5 % ointment Apply 1 application topically 4 (four) times daily as needed. 02/10/15   [place] N  Rumley, DO  loratadine (CLARITIN) 10 MG tablet Take 1 tablet (10 mg total) by mouth daily. 02/10/15   [place] N Rumley, DO  LORazepam (ATIVAN) 0.5 MG tablet Take 1 tablet (0.5 mg total) by mouth every 8 (eight) hours as needed for anxiety. 01/09/15   Kinnie Feil, MD  meloxicam (MOBIC) 15 MG tablet Take 1 tablet (15 mg total) by mouth daily. 01/28/15   Virginia Crews, MD  mometasone (NASONEX) 50 MCG/ACT nasal spray Place 2 sprays into the nose daily. Patient not taking: Reported on 02/11/2015 09/26/14   Nila Nephew, MD  oxyCODONE-acetaminophen (ROXICET) 5-325 MG per tablet Take 1 tablet by mouth every 4 (four) hours as needed for severe pain. 07/21/14   Iran Planas, MD  vitamin C (ASCORBIC ACID) 500 MG tablet Take 1 tablet (500 mg total) by mouth daily. 07/21/14   Iran Planas, MD   BP 133/84 mmHg  Pulse 79  Temp(Src) 97.7 F (36.5 C) (Oral)  Resp 16  SpO2 96% Physical Exam  Constitutional: She is oriented to person, place, and time. She appears well-developed and well-nourished.  HENT:  Head: Normocephalic and atraumatic.  Right Ear: External ear normal.  Left Ear: External ear normal.  Nose: Nose normal.  Mouth/Throat: Oropharynx is clear and moist.  Eyes: Conjunctivae and EOM are normal. Pupils are equal, round, and reactive to light.  Neck:  Tender c spine diffusely  Cardiovascular: Normal rate and normal heart sounds.   Pulmonary/Chest: Effort normal.  Abdominal: Soft. She exhibits no distension.  Musculoskeletal: She exhibits tenderness.  Tender right shoulder, pain with range of motion  Neurological: She is alert and oriented to person, place, and time.  Skin: Skin is warm.  Psychiatric: She has a normal mood and affect.  Nursing note and vitals reviewed.   ED Course  Procedures (including critical care time) Labs Review Labs Reviewed - No data to display  Imaging Review Dg Cervical Spine Complete  03/22/2015   CLINICAL DATA:  Acute neck pain after fall  yesterday in grocery store. Initial encounter.  EXAM: CERVICAL SPINE  4+ VIEWS  COMPARISON:  None.  FINDINGS: Status post surgical anterior fixation of C3, C4, C5 and C6. Good alignment of vertebral bodies is noted. No fracture or spondylolisthesis is noted. Moderate degenerative disc disease is noted at C6-7 with anterior and posterior osteophyte formation. Bilateral neural foraminal stenosis is noted at C6-7 secondary to uncovertebral spurring.  IMPRESSION: Postsurgical and degenerative changes as described above. No acute abnormality seen in the cervical spine.   Electronically Signed   By: Marijo Conception, M.D.   On: 03/22/2015 20:07   Dg Hand Complete Right  03/22/2015   CLINICAL DATA:  Fall with wrist injury. Prior wrist surgery in November 2015. Ulnar-sided pain.  EXAM: RIGHT HAND - COMPLETE 3+ VIEW  COMPARISON:  12/24/2014  FINDINGS:  Osteoarthritis with loss of interphalangeal articular space.  Prior radius and ulnar fractures with residual deformity which appears chronic. Chronic degenerative loss of articular space in the radiocarpal articulation. 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  IMPRESSION: 1. No acute fracture is identified.  Bony demineralization noted. 2. Chronic deformity of the distal radius and ulna.  Osteoarthritis. 3. 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   Electronically Signed   By: Van Clines M.D.   On: 03/22/2015 20:04     MDM   1. Contusion of neck, initial encounter   2. Contusion, hand, right, initial encounter    Pt given rx for hydrocodone Follow up with your MD for recheck Ice to areas of soreness    Fransico Meadow, PA-C 03/23/15 [place], PA-C 03/23/15 1722

## 2015-03-27 ENCOUNTER — Ambulatory Visit (INDEPENDENT_AMBULATORY_CARE_PROVIDER_SITE_OTHER): Payer: Medicaid Other | Admitting: Family Medicine

## 2015-03-27 ENCOUNTER — Encounter: Payer: Self-pay | Admitting: Family Medicine

## 2015-03-27 VITALS — BP 140/90 | HR 88 | Temp 97.8°F | Wt 163.0 lb

## 2015-03-27 DIAGNOSIS — W010XXA Fall on same level from slipping, tripping and stumbling without subsequent striking against object, initial encounter: Secondary | ICD-10-CM | POA: Insufficient documentation

## 2015-03-27 DIAGNOSIS — M47819 Spondylosis without myelopathy or radiculopathy, site unspecified: Secondary | ICD-10-CM | POA: Diagnosis present

## 2015-03-27 DIAGNOSIS — W010XXD Fall on same level from slipping, tripping and stumbling without subsequent striking against object, subsequent encounter: Secondary | ICD-10-CM

## 2015-03-27 DIAGNOSIS — Z1239 Encounter for other screening for malignant neoplasm of breast: Secondary | ICD-10-CM

## 2015-03-27 MED ORDER — CYCLOBENZAPRINE HCL 10 MG PO TABS: 10.0000 mg | ORAL_TABLET | Freq: Two times a day (BID) | ORAL | Status: DC | PRN

## 2015-03-27 MED ORDER — TRAZODONE HCL 50 MG PO TABS: ORAL_TABLET | ORAL | Status: DC

## 2015-03-27 MED ORDER — MELOXICAM 15 MG PO TABS: 15.0000 mg | ORAL_TABLET | Freq: Every day | ORAL | Status: DC

## 2015-03-27 NOTE — Progress Notes (Signed)
Subjective:    Patient ID: AMARISE LILLO is a 59 y.o. female presenting with Hip Pain  on 03/27/2015  HPI: Pt. 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  Fell on right side. Pain in her hand and notes pain in her hip since the fall. Pain is keeping her up at night. Has tried oxycodone with very little help.  Notes she has arthritis in her neck. Went to the Urgent care where hand, neck and lumbar spine showed several areas of arthritis and chronic changes in wrist. She has had several wrist surgeries in the past and needs a referral back to Ortho--has appointment on Monday.  Review of Systems  Constitutional: Negative for fever and chills.  Respiratory: Negative for shortness of breath.   Cardiovascular: Negative for chest pain.  Gastrointestinal: Negative for nausea, vomiting and abdominal pain.  Genitourinary: Negative for dysuria.  Musculoskeletal: Positive for back pain, arthralgias and neck pain.  Skin: Negative for rash.      Objective:    BP 147/105 mmHg  Pulse 88  Temp(Src) 97.8 F (36.6 C) (Oral)  Wt 163 lb (73.936 kg) Physical Exam  Constitutional: She is oriented to person, place, and time. She appears well-developed and well-nourished. No distress.  HENT:  Head: Normocephalic and atraumatic.  Eyes: No scleral icterus.  Neck: Neck supple.  Cardiovascular: Normal rate.   Pulmonary/Chest: Effort normal.  Abdominal: Soft.  Musculoskeletal: She exhibits tenderness.  There is decreased external rotation of the right shoulder and internal rotation of the right hip. There is pain with neck movement. There are no ecchymoses noted.  Neurological: She is alert and oriented to person, place, and time.  Skin: Skin is warm and dry.  Psychiatric: She has a normal mood and affect.   Dg Cervical Spine Complete  03/22/2015   CLINICAL DATA:  Acute neck pain after fall yesterday in grocery store. Initial encounter.  EXAM: CERVICAL SPINE  4+ VIEWS   COMPARISON:  None.  FINDINGS: Status post surgical anterior fixation of C3, C4, C5 and C6. Good alignment of vertebral bodies is noted. No fracture or spondylolisthesis is noted. Moderate degenerative disc disease is noted at C6-7 with anterior and posterior osteophyte formation. Bilateral neural foraminal stenosis is noted at C6-7 secondary to uncovertebral spurring.  IMPRESSION: Postsurgical and degenerative changes as described above. No acute abnormality seen in the cervical spine.   Electronically Signed   By: Marijo Conception, M.D.   On: 03/22/2015 20:07   Dg Hand Complete Right  03/22/2015   CLINICAL DATA:  Fall with wrist injury. Prior wrist surgery in November 2015. Ulnar-sided pain.  EXAM: RIGHT HAND - COMPLETE 3+ VIEW  COMPARISON:  12/24/2014  FINDINGS: Osteoarthritis with loss of interphalangeal articular space.  Prior radius and ulnar fractures with residual deformity which appears chronic. Chronic degenerative loss of articular space in the radiocarpal articulation. 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  IMPRESSION: 1. No acute fracture is identified.  Bony demineralization noted. 2. Chronic deformity of the distal radius and ulna.  Osteoarthritis. 3. 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   Electronically Signed   By: Van Clines M.D.   On: 03/22/2015 20:04        Assessment & Plan:   Problem List Items Addressed This Visit      Unprioritized   Arthritis of spine - Primary    Trial of NSAIDS, muscle relaxer. May need PT, if no significant improvement.  Relevant Medications   cyclobenzaprine (FLEXERIL) 10 MG tablet   traZODone (DESYREL) 50 MG tablet   meloxicam (MOBIC) 15 MG tablet   Other Relevant Orders   Ambulatory referral to Orthopedic Surgery   Fall due to wet surface    Patient with likely acute exacerbation of chronic issue due to fall       Other Visit Diagnoses    Screening for breast cancer        Relevant Orders     MM DIGITAL SCREENING BILATERAL       Return in about 3 months (around 06/27/2015) for a follow-up. and a pap smear.  PRATT,TANYA S 03/27/2015 9:07 AM

## 2015-03-27 NOTE — Assessment & Plan Note (Signed)
Patient with likely acute exacerbation of chronic issue due to fall

## 2015-03-27 NOTE — Patient Instructions (Signed)
Back Exercises Back exercises help treat and prevent back injuries. The goal of back exercises is to increase the strength of your abdominal and back muscles and the flexibility of your back. These exercises should be started when you no longer have back pain. Back exercises include:  Pelvic Tilt. Lie on your back with your knees bent. Tilt your pelvis until the lower part of your back is against the floor. Hold this position 5 to 10 sec and repeat 5 to 10 times.  Knee to Chest. Pull first 1 knee up against your chest and hold for 20 to 30 seconds, repeat this with the other knee, and then both knees. This may be done with the other leg straight or bent, whichever feels better.  Sit-Ups or Curl-Ups. Bend your knees 90 degrees. Start with tilting your pelvis, and do a partial, slow sit-up, lifting your trunk only 30 to 45 degrees off the floor. Take at least 2 to 3 seconds for each sit-up. Do not do sit-ups with your knees out straight. If partial sit-ups are difficult, simply do the above but with only tightening your abdominal muscles and holding it as directed.  Hip-Lift. Lie on your back with your knees flexed 90 degrees. Push down with your feet and shoulders as you raise your hips a couple inches off the floor; hold for 10 seconds, repeat 5 to 10 times.  Back arches. Lie on your stomach, propping yourself up on bent elbows. Slowly press on your hands, causing an arch in your low back. Repeat 3 to 5 times. Any initial stiffness and discomfort should lessen with repetition over time.  Shoulder-Lifts. Lie face down with arms beside your body. Keep hips and torso pressed to floor as you slowly lift your head and shoulders off the floor. Do not overdo your exercises, especially in the beginning. Exercises may cause you some mild back discomfort which lasts for a few minutes; however, if the pain is more severe, or lasts for more than 15 minutes, do not continue exercises until you see your caregiver.  Improvement with exercise therapy for back problems is slow.  See your caregivers for assistance with developing a proper back exercise program. Document Released: 10/06/2004 Document Revised: 11/21/2011 Document Reviewed: 06/30/2011 [facility] Patient Information 2015 [place], [place]. This information is not intended to replace advice given to you by your health care provider. Make sure you discuss any questions you have with your health care provider. Arthritis, Nonspecific Arthritis is inflammation of a joint. This usually means pain, redness, warmth or swelling are present. One or more joints may be involved. There are a number of types of arthritis. Your caregiver may not be able to tell what type of arthritis you have right away. CAUSES  The most common cause of arthritis is the wear and tear on the joint (osteoarthritis). This causes damage to the cartilage, which can break down over time. The knees, hips, back and neck are most often affected by this type of arthritis. Other types of arthritis and common causes of joint pain include:  Sprains and other injuries near the joint. Sometimes minor sprains and injuries cause pain and swelling that develop hours later.  Rheumatoid arthritis. This affects hands, feet and knees. It usually affects both sides of your body at the same time. It is often associated with chronic ailments, fever, weight loss and general weakness.  Crystal arthritis. Gout and pseudo gout can cause occasional acute severe pain, redness and swelling in the foot, ankle, or  knee.  Infectious arthritis. Bacteria can get into a joint through a break in overlying skin. This can cause infection of the joint. Bacteria and viruses can also spread through the blood and affect your joints.  Drug, infectious and allergy reactions. Sometimes joints can become mildly painful and slightly swollen with these types of illnesses. SYMPTOMS   Pain is the main symptom.  Your joint or joints  can also be red, swollen and warm or hot to the touch.  You may have a fever with certain types of arthritis, or even feel overall ill.  The joint with arthritis will hurt with movement. Stiffness is present with some types of arthritis. DIAGNOSIS  Your caregiver will suspect arthritis based on your description of your symptoms and on your exam. Testing may be needed to find the type of arthritis:  Blood and sometimes urine tests.  X-ray tests and sometimes CT or MRI scans.  Removal of fluid from the joint (arthrocentesis) is done to check for bacteria, crystals or other causes. Your caregiver (or a specialist) will numb the area over the joint with a local anesthetic, and use a needle to remove joint fluid for examination. This procedure is only minimally uncomfortable.  Even with these tests, your caregiver may not be able to tell what kind of arthritis you have. Consultation with a specialist (rheumatologist) may be helpful. TREATMENT  Your caregiver will discuss with you treatment specific to your type of arthritis. If the specific type cannot be determined, then the following general recommendations may apply. Treatment of severe joint pain includes:  Rest.  Elevation.  Anti-inflammatory medication (for example, ibuprofen) may be prescribed. Avoiding activities that cause increased pain.  Only take over-the-counter or prescription medicines for pain and discomfort as recommended by your caregiver.  Cold packs over an inflamed joint may be used for 10 to 15 minutes every hour. Hot packs sometimes feel better, but do not use overnight. Do not use hot packs if you are diabetic without your caregiver's permission.  A cortisone shot into arthritic joints may help reduce pain and swelling.  Any acute arthritis that gets worse over the next 1 to 2 days needs to be looked at to be sure there is no joint infection. Long-term arthritis treatment involves modifying activities and lifestyle  to reduce joint stress jarring. This can include weight loss. Also, exercise is needed to nourish the joint cartilage and remove waste. This helps keep the muscles around the joint strong. HOME CARE INSTRUCTIONS   Do not take aspirin to relieve pain if gout is suspected. This elevates uric acid levels.  Only take over-the-counter or prescription medicines for pain, discomfort or fever as directed by your caregiver.  Rest the joint as much as possible.  If your joint is swollen, keep it elevated.  Use crutches if the painful joint is in your leg.  Drinking plenty of fluids may help for certain types of arthritis.  Follow your caregiver's dietary instructions.  Try low-impact exercise such as:  Swimming.  Water aerobics.  Biking.  Walking.  Morning stiffness is often relieved by a warm shower.  Put your joints through regular range-of-motion. SEEK MEDICAL CARE IF:   You do not feel better in 24 hours or are getting worse.  You have side effects to medications, or are not getting better with treatment. SEEK IMMEDIATE MEDICAL CARE IF:   You have a fever.  You develop severe joint pain, swelling or redness.  Many joints are involved and become  painful and swollen.  There is severe back pain and/or leg weakness.  You have loss of bowel or bladder control. Document Released: 10/06/2004 Document Revised: 11/21/2011 Document Reviewed: 10/22/2008 [facility] Patient Information 2015 [place], [place]. This information is not intended to replace advice given to you by your health care provider. Make sure you discuss any questions you have with your health care provider.

## 2015-03-27 NOTE — Assessment & Plan Note (Signed)
Trial of NSAIDS, muscle relaxer. May need PT, if no significant improvement.

## 2015-04-21 ENCOUNTER — Ambulatory Visit
Admission: RE | Admit: 2015-04-21 | Discharge: 2015-04-21 | Disposition: A | Discharge status: Home / Self Care | Payer: Medicare Other | Source: Ambulatory Visit | Attending: Family Medicine | Admitting: Family Medicine

## 2015-04-21 DIAGNOSIS — Z1239 Encounter for other screening for malignant neoplasm of breast: Secondary | ICD-10-CM

## 2015-05-22 ENCOUNTER — Other Ambulatory Visit (HOSPITAL_COMMUNITY)
Admission: RE | Admit: 2015-05-22 | Discharge: 2015-05-22 | Disposition: A | Discharge status: Home / Self Care | Payer: PRIVATE HEALTH INSURANCE | Source: Ambulatory Visit | Attending: Family Medicine | Admitting: Family Medicine

## 2015-05-22 ENCOUNTER — Encounter: Payer: Self-pay | Admitting: Family Medicine

## 2015-05-22 ENCOUNTER — Telehealth: Payer: Self-pay | Admitting: Family Medicine

## 2015-05-22 ENCOUNTER — Ambulatory Visit (INDEPENDENT_AMBULATORY_CARE_PROVIDER_SITE_OTHER): Payer: Medicare Other | Admitting: Family Medicine

## 2015-05-22 VITALS — BP 129/77 | HR 84 | Temp 98.0°F | Ht 67.75 in | Wt 162.0 lb

## 2015-05-22 DIAGNOSIS — Z113 Encounter for screening for infections with a predominantly sexual mode of transmission: Secondary | ICD-10-CM | POA: Insufficient documentation

## 2015-05-22 DIAGNOSIS — N898 Other specified noninflammatory disorders of vagina: Secondary | ICD-10-CM | POA: Diagnosis present

## 2015-05-22 DIAGNOSIS — A599 Trichomoniasis, unspecified: Secondary | ICD-10-CM

## 2015-05-22 DIAGNOSIS — N760 Acute vaginitis: Secondary | ICD-10-CM | POA: Insufficient documentation

## 2015-05-22 LAB — HEPATITIS PANEL, ACUTE
HCV Ab: NEGATIVE
Hep A IgM: NONREACTIVE
Hep B C IgM: NONREACTIVE
Hepatitis B Surface Ag: NEGATIVE

## 2015-05-22 LAB — POCT WET PREP (WET MOUNT): CLUE CELLS WET PREP WHIFF POC: NEGATIVE

## 2015-05-22 LAB — HIV ANTIBODY (ROUTINE TESTING W REFLEX): HIV 1&2 Ab, 4th Generation: NONREACTIVE

## 2015-05-22 NOTE — Patient Instructions (Signed)
Testing for infections I will call you this afternoon with results of swab, and then again next week when I have the results of your bloodwork  Be well, Dr. Ardelia Mems

## 2015-05-22 NOTE — Progress Notes (Signed)
Patient ID: BRISA AUTH, female   DOB: 04/28/1956, 59 y.o.   MRN: [id number]  HPI:  Pt presents for a same day appointment to discuss vaginal discharge.  Has had vaginal discharge for about 5 days. Associated with mild itch, and no odor. No hx of this in the past, other than frequent yeast infections when she was a lot younger. No vaginal bleeding or pelvic pain. LMP was over 2 years ago. Sexually active with one female partner in the last year (husband). Does report having frequent vaginal dryness since entering menopause. Has no bladder incontinence.   ROS: See HPI  [place]: hx anxiety/depression, smoker, postmenopausal  PHYSICAL EXAM: BP 129/77 mmHg  Pulse 84  Temp(Src) 98 F (36.7 C) (Oral)  Ht 5' 7.75" (1.721 m)  Wt 162 lb (73.483 kg)  BMI 24.81 kg/m2 Gen: NAD, pleasant, cooperative GU: normal appearing external genitalia without lesions. Vagina is moist with thin yellow discharge. Cervix normal in appearance. No cervical motion tenderness. Mild generalized tenderness on bimanual exam without peritoneal signs. No adnexal masses.   ASSESSMENT/PLAN:  1. Vaginal discharge: no clear etiology by hx or exam. May have atrophic vaginitis vs BV vs yeast - wet prep, gc/chlamydia/trich testing today - complete STD testing with HIV, RPR, hepatitis panel - await results before deciding tx  FOLLOW UP: F/u as needed if symptoms worsen or do not improve.   Stra[place]. Ardelia Mems, [place]

## 2015-05-22 NOTE — Telephone Encounter (Signed)
Attempted to reach pt about + trichomonas. First number ([phone]) disconnected. Second number female answered. Left generic message with him asking for pt to call her doctor back on Monday.  Leeanne Rio, MD

## 2015-05-23 LAB — RPR

## 2015-05-25 MED ORDER — METRONIDAZOLE 500 MG PO TABS: 2000.0000 mg | ORAL_TABLET | Freq: Once | ORAL | Status: DC

## 2015-05-26 ENCOUNTER — Telehealth: Payer: Self-pay | Admitting: Student

## 2015-05-26 LAB — CERVICOVAGINAL ANCILLARY ONLY
CHLAMYDIA, DNA PROBE: NEGATIVE
NEISSERIA GONORRHEA: NEGATIVE
TRICH (WINDOWPATH): POSITIVE — AB

## 2015-05-26 NOTE — Telephone Encounter (Signed)
Late entry note: patient came into clinic yesterday and was given information about this diagnosis, and opted for 2g once dosing for treatment. Was instructed to have partner treated as well, and to abstain from intercourse for at least 7 days after both have been treated. Pt upset at this news but accepting of diagnosis.  Leeanne Rio, MD

## 2015-05-26 NOTE — Telephone Encounter (Signed)
Will forward to Dr. Ardelia Mems since see saw pt last and prescribed med. Makiah Clauson, CMA.

## 2015-05-26 NOTE — Telephone Encounter (Signed)
Was given antibotic yesterday ( metonidazole). Wants to know how long will it take to clear up the condition so she can schedule surgery. Please advise

## 2015-05-29 NOTE — Telephone Encounter (Signed)
Generally, she should be free of infection one week later. If she wants to return for a test of cure, we can do that. Please inform patient.  Leeanne Rio, MD

## 2015-06-01 NOTE — Telephone Encounter (Signed)
Patient informed, expressed understanding. 

## 2015-06-08 ENCOUNTER — Encounter: Payer: Self-pay | Admitting: Student

## 2015-06-08 ENCOUNTER — Ambulatory Visit (INDEPENDENT_AMBULATORY_CARE_PROVIDER_SITE_OTHER): Payer: PRIVATE HEALTH INSURANCE | Admitting: Student

## 2015-06-08 VITALS — BP 133/75 | HR 79 | Temp 98.0°F | Ht 68.0 in | Wt 161.0 lb

## 2015-06-08 DIAGNOSIS — Z202 Contact with and (suspected) exposure to infections with a predominantly sexual mode of transmission: Secondary | ICD-10-CM | POA: Diagnosis not present

## 2015-06-08 DIAGNOSIS — Z1211 Encounter for screening for malignant neoplasm of colon: Secondary | ICD-10-CM | POA: Insufficient documentation

## 2015-06-08 DIAGNOSIS — Z23 Encounter for immunization: Secondary | ICD-10-CM

## 2015-06-08 DIAGNOSIS — G2581 Restless legs syndrome: Secondary | ICD-10-CM | POA: Diagnosis not present

## 2015-06-08 DIAGNOSIS — A599 Trichomoniasis, unspecified: Secondary | ICD-10-CM

## 2015-06-08 DIAGNOSIS — Z0000 Encounter for general adult medical examination without abnormal findings: Secondary | ICD-10-CM

## 2015-06-08 DIAGNOSIS — B009 Herpesviral infection, unspecified: Secondary | ICD-10-CM

## 2015-06-08 DIAGNOSIS — M47819 Spondylosis without myelopathy or radiculopathy, site unspecified: Secondary | ICD-10-CM | POA: Diagnosis not present

## 2015-06-08 LAB — POCT WET PREP (WET MOUNT): Clue Cells Wet Prep Whiff POC: NEGATIVE

## 2015-06-08 MED ORDER — LORATADINE 10 MG PO TABS: 10.0000 mg | ORAL_TABLET | Freq: Every day | ORAL | Status: DC

## 2015-06-08 MED ORDER — HYDROXYZINE PAMOATE 50 MG PO CAPS: 100.0000 mg | ORAL_CAPSULE | Freq: Every evening | ORAL | Status: DC | PRN

## 2015-06-08 MED ORDER — CYCLOBENZAPRINE HCL 10 MG PO TABS: 10.0000 mg | ORAL_TABLET | Freq: Two times a day (BID) | ORAL | Status: DC | PRN

## 2015-06-08 MED ORDER — DOCUSATE SODIUM 100 MG PO CAPS: 100.0000 mg | ORAL_CAPSULE | Freq: Two times a day (BID) | ORAL | Status: DC

## 2015-06-08 MED ORDER — LIDOCAINE 5 % EX OINT: 1.0000 "application " | TOPICAL_OINTMENT | Freq: Four times a day (QID) | CUTANEOUS | Status: DC | PRN

## 2015-06-08 MED ORDER — TRAZODONE HCL 50 MG PO TABS: ORAL_TABLET | ORAL | Status: DC

## 2015-06-08 MED ORDER — LORAZEPAM 0.5 MG PO TABS: 0.5000 mg | ORAL_TABLET | Freq: Three times a day (TID) | ORAL | Status: DC | PRN

## 2015-06-08 MED ORDER — MOMETASONE FUROATE 50 MCG/ACT NA SUSP: 2.0000 | Freq: Every day | NASAL | Status: DC

## 2015-06-08 MED ORDER — CLOBETASOL PROPIONATE 0.05 % EX CREA: 1.0000 "application " | TOPICAL_CREAM | Freq: Two times a day (BID) | CUTANEOUS | Status: DC | PRN

## 2015-06-08 MED ORDER — CARBAMIDE PEROXIDE 6.5 % OT SOLN: 5.0000 [drp] | Freq: Two times a day (BID) | OTIC | Status: DC

## 2015-06-08 MED ORDER — GABAPENTIN 300 MG PO CAPS: 300.0000 mg | ORAL_CAPSULE | Freq: Three times a day (TID) | ORAL | Status: DC

## 2015-06-08 NOTE — Assessment & Plan Note (Addendum)
Test of cure today. Pt counseled that trichomonas is an STD and encouraged to ensure her husband recieives treatment

## 2015-06-08 NOTE — Patient Instructions (Signed)
Return in 3 months You had a test of cure for Trichomonas today Trichomonas is a sexually transmitted disease Please refrain for intercourse with your partner until after he is treated for Trichomonas If you have questions or concerns please call the Family Medicine office
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
## 2015-06-08 NOTE — Assessment & Plan Note (Signed)
Pt due for pap smear - Will perform at next visit - Discuss zostavax at next visit - Pt refused flu vaccine, will continue to discuss

## 2015-06-29 ENCOUNTER — Other Ambulatory Visit: Payer: Self-pay | Admitting: Orthopedic Surgery

## 2015-06-30 ENCOUNTER — Other Ambulatory Visit: Payer: Self-pay | Admitting: Orthopedic Surgery

## 2015-06-30 DIAGNOSIS — S52561D Barton's fracture of right radius, subsequent encounter for closed fracture with routine healing: Secondary | ICD-10-CM

## 2015-07-03 ENCOUNTER — Ambulatory Visit
Admission: RE | Admit: 2015-07-03 | Discharge: 2015-07-03 | Disposition: A | Discharge status: Home / Self Care | Payer: PRIVATE HEALTH INSURANCE | Source: Ambulatory Visit | Attending: Orthopedic Surgery | Admitting: Orthopedic Surgery

## 2015-07-03 DIAGNOSIS — S52561D Barton's fracture of right radius, subsequent encounter for closed fracture with routine healing: Secondary | ICD-10-CM

## 2015-09-16 ENCOUNTER — Other Ambulatory Visit: Payer: Self-pay | Admitting: Orthopedic Surgery

## 2015-09-21 ENCOUNTER — Ambulatory Visit (HOSPITAL_COMMUNITY)
Admission: RE | Admit: 2015-09-21 | Payer: PRIVATE HEALTH INSURANCE | Source: Ambulatory Visit | Admitting: Orthopedic Surgery

## 2015-09-21 ENCOUNTER — Encounter (HOSPITAL_COMMUNITY): Admission: RE | Payer: Self-pay | Source: Ambulatory Visit

## 2015-09-21 SURGERY — TOTAL WRIST ARTHROPLASTY
Anesthesia: General | Laterality: Right

## 2015-10-13 ENCOUNTER — Other Ambulatory Visit: Payer: Self-pay | Admitting: *Deleted

## 2015-10-13 MED ORDER — HYDROXYZINE PAMOATE 50 MG PO CAPS: 100.0000 mg | ORAL_CAPSULE | Freq: Every evening | ORAL | Status: DC | PRN

## 2015-10-19 ENCOUNTER — Encounter: Payer: Self-pay | Admitting: Student

## 2015-10-19 ENCOUNTER — Ambulatory Visit (INDEPENDENT_AMBULATORY_CARE_PROVIDER_SITE_OTHER): Payer: Medicare Other | Admitting: Student

## 2015-10-19 VITALS — BP 120/70 | HR 86 | Temp 98.6°F | Ht 68.0 in | Wt 167.0 lb

## 2015-10-19 DIAGNOSIS — M545 Low back pain, unspecified: Secondary | ICD-10-CM

## 2015-10-19 DIAGNOSIS — F411 Generalized anxiety disorder: Secondary | ICD-10-CM | POA: Diagnosis not present

## 2015-10-19 DIAGNOSIS — F419 Anxiety disorder, unspecified: Secondary | ICD-10-CM

## 2015-10-19 DIAGNOSIS — B002 Herpesviral gingivostomatitis and pharyngotonsillitis: Secondary | ICD-10-CM | POA: Diagnosis not present

## 2015-10-19 MED ORDER — VALACYCLOVIR HCL 1 G PO TABS: 2000.0000 mg | ORAL_TABLET | Freq: Two times a day (BID) | ORAL | Status: DC

## 2015-10-19 MED ORDER — SERTRALINE HCL 50 MG PO TABS: 50.0000 mg | ORAL_TABLET | Freq: Every day | ORAL | Status: DC

## 2015-10-19 MED ORDER — LORAZEPAM 0.5 MG PO TABS: 0.5000 mg | ORAL_TABLET | Freq: Three times a day (TID) | ORAL | Status: DC | PRN

## 2015-10-19 NOTE — Assessment & Plan Note (Signed)
Patient currently anxious and setting of being out of her Ativan. She also is not currently on a more long-term form of anxiety treatment like an SSRI. - We'll start Zoloft 50 mg daily and refill Ativan - Discussed the need to reduce Ativan use as much as tolerated given potential for long-term sequela - Zoloft additionally could be helpful for hot flashes as previously documented that she was on Celexa for hot flashes but I do not see that on her medication list - Will follow-up in 2 weeks

## 2015-10-19 NOTE — Assessment & Plan Note (Signed)
History and exam consistent with oral herpes. - We'll treat with valacyclovir - -Precautions discussed

## 2015-10-19 NOTE — Assessment & Plan Note (Signed)
Stable back pain without red flag symptoms. Discussed physical therapy for pain but patient was adamantly against this. As Tylenol has been helping her pain will continue this. - Discussed that we will not narcotic pain medication for her back pain given risk for dependence

## 2015-10-19 NOTE — Progress Notes (Signed)
   Subjective:    Patient ID: [name], female    DOB: 1956-09-06, 60 y.o.   MRN: [id number]   CC: Back pain  HPI: 60 year-old female presenting for back pain  Back pain - Pain is similar to her chronic back pain.  - Denies new weakness -Denies loss of bowel or bladder continence -  she reports that she is very stressed out about an upcoming surgery on her wrist when she is stressed out she cannot handle pain  - She denies pain at this time as she took Tylenol last night which typically helps her pain   Wrist surgery - She has had multiple surgeries on her right wrist after falling on it. - She is worried that this additional surgery will not really help her she'll need further surgery after this   Anxiety - She has a history of anxiety for which she is treated with Ativan. However she ran out of her Ativan approximately 2 weeks ago   "Fever blisters" - She reports that when she gets worried she develops peri-oral fever blisters - This is been occurring since she was in her 60s - she denies knowing that fever blisters mean herpetic lesions -  she reports that they typically start as bumps then progressed to open sores   Review of Systems ROS  per the history of present illness otherwise she denies fevers, recent illnesses, chest pain, shortness of breath, nausea, vomiting, diarrhea, weakness Past Medical, Surgical, Social, and Family History Reviewed & Updated per EMR.   Objective:  BP 120/70 mmHg  Pulse 86  Temp(Src) 98.6 F (37 C) (Oral)  Ht '5\' 8"'$  (1.727 m)  Wt 167 lb (75.751 kg)  BMI 25.40 kg/m2  SpO2 98% Vitals and nursing note reviewed  General: NAD, intermittently tearful when talking about her upcoming surgery  HEENT: 2 closed lesions over her lips one on the lower lip approximately half a centimeter in greatest diameter, one on upper lip, approximately quarter of a centimeter in greatest diameter  Cardiac: RRR, normal heart sounds, no murmurs.  Respiratory: CTAB, normal effort Abdomen: soft, nontender, nondistended,Bowel sounds present Extremities: no edema or cyanosis. WWP. MSK: Slight pain to palpation over left aspect of lower back otherwise no pain to the patient over back, negative straight leg raise bilaterally Skin: warm and dry, no rashes noted Neuro: alert and oriented, no focal deficits   Assessment & Plan:    Oral herpes History and exam consistent with oral herpes. - We'll treat with valacyclovir - -Precautions discussed  Anxiety Patient currently anxious and setting of being out of her Ativan. She also is not currently on a more long-term form of anxiety treatment like an SSRI. - We'll start Zoloft 50 mg daily and refill Ativan - Discussed the need to reduce Ativan use as much as tolerated given potential for long-term sequela - Zoloft additionally could be helpful for hot flashes as previously documented that she was on Celexa for hot flashes but I do not see that on her medication list - Will follow-up in 2 weeks  Lumbar spine pain Stable back pain without red flag symptoms. Discussed physical therapy for pain but patient was adamantly against this. As Tylenol has been helping her pain will continue this. - Discussed that we will not narcotic pain medication for her back pain given risk for dependence     Leasia Swann A. Lincoln Brigham MD, [facility] Resident PGY-2 Pager [phone]

## 2015-10-19 NOTE — Patient Instructions (Addendum)
Return in 2 weeks If you have questions or concerns, call the office at [phone]

## 2015-10-30 ENCOUNTER — Encounter: Payer: Self-pay | Admitting: Student

## 2015-10-30 ENCOUNTER — Ambulatory Visit (INDEPENDENT_AMBULATORY_CARE_PROVIDER_SITE_OTHER): Payer: Medicare Other | Admitting: Student

## 2015-10-30 VITALS — BP 138/70 | HR 67 | Temp 97.6°F | Ht 68.0 in | Wt 165.0 lb

## 2015-10-30 DIAGNOSIS — N951 Menopausal and female climacteric states: Secondary | ICD-10-CM | POA: Diagnosis not present

## 2015-10-30 DIAGNOSIS — F329 Major depressive disorder, single episode, unspecified: Secondary | ICD-10-CM | POA: Diagnosis not present

## 2015-10-30 DIAGNOSIS — F419 Anxiety disorder, unspecified: Secondary | ICD-10-CM | POA: Diagnosis not present

## 2015-10-30 DIAGNOSIS — G4700 Insomnia, unspecified: Secondary | ICD-10-CM | POA: Insufficient documentation

## 2015-10-30 DIAGNOSIS — F32A Depression, unspecified: Secondary | ICD-10-CM

## 2015-10-30 NOTE — Assessment & Plan Note (Signed)
Currently takes trazodone for insomnia, however still has difficulty sleeping potentially now exacerbated by anxiety surrounding upcoming surgery - Discussed need for behavior changes in addition to trazodone use - Will continue to follow

## 2015-10-30 NOTE — Assessment & Plan Note (Addendum)
Started on Zoloft on 10/19/2015 and tolerating it well with good compliance and no side effects - Will continue to follow her on Zoloft and uptitrate as needed - Discussed potential psychotherapy which she is interested in starting after surgery

## 2015-10-30 NOTE — Assessment & Plan Note (Signed)
No formal depression diagnosis, however on her problem list. She is on Zoloft for anxiety which can help with depression - Will consider PHQ 9 at next visit her she will have been treated with Zoloft for one month at that time

## 2015-10-30 NOTE — Patient Instructions (Signed)
Follow-up in one month for anxiety  We discussed your Zoloft today -Please continue to take every day - If you have any worrisome side effects please stop Zoloft and call the office   We also discussed her sleep habits  - Please do not drink caffeinated beverages at least 4 hours prior to bedtime  - Please only sleep in bed do not read  - Please try to limit naps during the day   If you have further questions or concerns please call the office at [phone]

## 2015-10-30 NOTE — Assessment & Plan Note (Signed)
Started on Zoloft for anxiety which can also help with her hot flashes - Will continue to follow her menopausal symptoms

## 2015-10-30 NOTE — Progress Notes (Signed)
   Subjective:    Patient ID: [name], female    DOB: [birth year], 60 y.o.   MRN: [id number]   CC: Zoloft follow-up  HPI: 60 year old female with history of anxiety presenting for follow-up after Zoloft initiation  Zoloft initiation - Patient reports she has obtained the Zoloft that has been taking it daily. Denies missed doses - Denies worrisome side effects like nausea, vomiting, abdominal pain - Denies suicidal ideation - Does report feeling tired due to difficulty sleeping but is not sure if this is due to Zoloft as it has been ongoing since her wrist injury - Does not feel the Zoloft has started to help her anxiety at  Sleeping issues -   difficulty sleeping since her wrist injury  - Feels that this is secondary to worries surrounding her wrist dysfunction  - Has difficulty falling asleep, and staying asleep - Has tried to turn off the television, and treating to help her fall asleep - She does take trazodone for sleep  Wrist surgery  - Resurgery scheduled for 11/09/2015 - Worried most about potential pain after the surgery and recuperation time  Review of Systems ROS Per history of present illness otherwise denies recent fevers, illnesses, weakness, headache, changes in vision Past Medical, Surgical, Social, and Family History Reviewed & Updated per EMR.   Objective:  BP 138/70 mmHg  Pulse 67  Temp(Src) 97.6 F (36.4 C) (Oral)  Ht '5\' 8"'$  (1.727 m)  Wt 165 lb (74.844 kg)  BMI 25.09 kg/m2  SpO2 99% Vitals and nursing note reviewed  General: NAD Cardiac: RRR Respiratory: CTAB, normal effort Neuro: alert and oriented, no focal deficits   Assessment & Plan:    Anxiety Started on Zoloft on 10/19/2015 and tolerating it well with good compliance and no side effects - Will continue to follow her on Zoloft and uptitrate as needed - Discussed potential psychotherapy which she is interested in starting after surgery  Hot flashes, menopausal Started on Zoloft for  anxiety which can also help with her hot flashes - Will continue to follow her menopausal symptoms  Depression No formal depression diagnosis, however on her problem list. She is on Zoloft for anxiety which can help with depression - Will consider PHQ 9 at next visit her she will have been treated with Zoloft for one month at that time  Insomnia Currently takes trazodone for insomnia, however still has difficulty sleeping potentially now exacerbated by anxiety surrounding upcoming surgery - Discussed need for behavior changes in addition to trazodone use - Will continue to follow     Davyn Morandi A. Lincoln Brigham MD, [facility] Resident PGY-2 Pager [phone]

## 2015-11-02 ENCOUNTER — Other Ambulatory Visit (HOSPITAL_COMMUNITY): Payer: Self-pay | Admitting: *Deleted

## 2015-11-02 ENCOUNTER — Encounter (HOSPITAL_COMMUNITY)
Admission: RE | Admit: 2015-11-02 | Discharge: 2015-11-02 | Disposition: A | Discharge status: Home / Self Care | Payer: Medicare Other | Source: Ambulatory Visit | Attending: Orthopedic Surgery | Admitting: Orthopedic Surgery

## 2015-11-02 ENCOUNTER — Telehealth: Payer: Self-pay | Admitting: Student

## 2015-11-02 ENCOUNTER — Other Ambulatory Visit (HOSPITAL_COMMUNITY): Payer: PRIVATE HEALTH INSURANCE

## 2015-11-02 ENCOUNTER — Encounter (HOSPITAL_COMMUNITY): Payer: Self-pay

## 2015-11-02 DIAGNOSIS — M25641 Stiffness of right hand, not elsewhere classified: Secondary | ICD-10-CM | POA: Diagnosis not present

## 2015-11-02 DIAGNOSIS — M25631 Stiffness of right wrist, not elsewhere classified: Secondary | ICD-10-CM | POA: Diagnosis not present

## 2015-11-02 DIAGNOSIS — Z01812 Encounter for preprocedural laboratory examination: Secondary | ICD-10-CM | POA: Diagnosis not present

## 2015-11-02 LAB — CBC
HEMATOCRIT: 41.6 % (ref 36.0–46.0)
Hemoglobin: 13.1 g/dL (ref 12.0–15.0)
MCH: 27.5 pg (ref 26.0–34.0)
MCHC: 31.5 g/dL (ref 30.0–36.0)
MCV: 87.2 fL (ref 78.0–100.0)
PLATELETS: 394 10*3/uL (ref 150–400)
RBC: 4.77 MIL/uL (ref 3.87–5.11)
RDW: 14.1 % (ref 11.5–15.5)
WBC: 9.7 10*3/uL (ref 4.0–10.5)

## 2015-11-02 NOTE — Telephone Encounter (Signed)
If the diarrhea is intolerable, she should stop the zoloft. She should take half a tablet for the next 4 days, then half a tablet every other day for 4 days, and then stop. If she is still having diarrhea, she needs to be seen. She will need to schedule an appointment with Dr Lincoln Brigham for discussing other medications.  [name]. Jerline Pain, [facility] Resident PGY-2 11/02/2015 12:10 PM

## 2015-11-02 NOTE — Telephone Encounter (Signed)
Pt called because since starting Zoloft she has had diarrhea every day. She would like something else to take. jw

## 2015-11-02 NOTE — Telephone Encounter (Signed)
Spoke with patient and she voiced understanding on medication tapering and will plan to let us know if it doesn't help.  She already has an appt with Dr. Lincoln Brigham on 11/23/15 and will call if she needs one sooner. Jazmin Hartsell,CMA

## 2015-11-02 NOTE — Telephone Encounter (Signed)
Will forward to Dr. Lincoln Brigham to advise. Jazmin Hartsell,CMA

## 2015-11-02 NOTE — Pre-Procedure Instructions (Addendum)
[name]  11/02/2015      [facility] - [place] ([place]), [place] - [address] [place] [address] Phone: [phone] Fax: [phone]  [facility] [address] [place], [place] - [address] [place] [address] Phone: [phone] Fax: [phone]  [id number] [place]: [phone]: [phone] [facility] [id number]: [phone]: [phone]   Your procedure is scheduled on Monday, November 09, 2015 at 12:45 PM.  [facility] 10:45 AM.  Call this number if you have problems the morning of surgery: [phone]  Any questions prior to day of surgery, please call [phone] between 8 & 4 PM.   Remember:  Do not eat food or drink liquids after midnight Sunday, 11/08/15.  Take these medicines the morning of surgery with A SIP OF WATER: Gabapentin (Neurontin), Sertraline (Zoloft), Valacyclovir (Valtrex), Hydrocodone - if needed,ativan if needed  STOP all herbel meds, nsaids (aleve,naproxen,advil,ibuprofen)  prior to surgery starting 11/04/15 including aspirin, meloxicam, vitamins    Do not wear jewelry, make-up or nail polish.  Do not wear lotions, powders, or perfumes.  You may wear deodorant.  Do not shave 48 hours prior to surgery.    Do not bring valuables to the hospital.  [facility] any belongings or [facility]ables.  Contacts, dentures or bridgework may not be worn into surgery.  Leave your suitcase in the car.  After surgery it may be brought to your room.  For patients admitted to the hospital, discharge time will be determined by your treatment team. ** Special instructions:   Special Instructions: [place] -  Preparing for Surgery  Before surgery, you can play an important role.  Because skin is not sterile, your skin needs to be as free of germs as possible.  You can reduce the number of germs on you skin by washing with CHG (chlorahexidine gluconate) soap before surgery.  CHG is an antiseptic cleaner which kills germs and bonds with the skin to continue killing germs even after washing.  Please DO NOT use if you have an allergy to CHG or antibacterial soaps.  If your skin becomes reddened/irritated stop using the CHG and inform your nurse when you arrive at Short Stay.  Do not shave (including legs and underarms) for at least 48 hours prior to the first CHG shower.  You may shave your face.  Please follow these instructions carefully:   1.  Shower with CHG Soap the night before surgery and the morning of Surgery.  2.  If you choose to wash your hair, wash your hair first as usual with your normal shampoo.  3.  After you shampoo, rinse your hair and body thoroughly to remove the Shampoo.  4.  Use CHG as you would any other liquid soap.  You can apply chg directly  to the skin and wash gently with scrungie or a clean washcloth.  5.  Apply the CHG Soap to your body ONLY FROM THE NECK DOWN.  Do not use on open wounds or open sores.  Avoid contact with your eyes ears, mouth and genitals (private parts).  Wash genitals (private parts)       with your normal soap.  6.  Wash thoroughly, paying special attention to the area where your surgery will be performed.  7.  Thoroughly rinse your body with warm water from the neck down.  8.  DO NOT shower/wash with your normal soap after using and rinsing off the CHG Soap.  9.  Pat yourself dry with a clean towel.            10.  Wear clean pajamas.            11.  Place clean sheets on your bed the night of your first shower and do not sleep with pets.  Day of Surgery  Do not apply any lotions/deodorants the morning of surgery.  Please wear clean clothes to the  hospital/surgery center. Please read over the following fact sheets that you were given. Pain Booklet, Coughing and Deep Breathing and Surgical Site Infection Prevention

## 2015-11-07 NOTE — Brief Op Note (Signed)
11/09/2015  9:41 AM  PATIENT:  Jeanice Lim Aramburo  60 y.o. 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   POST-OPERATIVE DIAGNOSIS:  * No post-op diagnosis entered *  PROCEDURE:  Procedure(s): RIGHT WRIST DISTAL RADIAL ULNAR JOINT ARTHROTOMY AND DEBRIDEMENT,  AND  (Right) OR DISTAL ULNAR RESECTION ARTHROPLASTY  (Right)  SURGEON:  Surgeon(s) and Role:    * Iran Planas, MD - Primary  PHYSICIAN ASSISTANT: chabon pac  ASSISTANTS: none   ANESTHESIA:   general  EBL:     BLOOD ADMINISTERED:none  DRAINS: none   LOCAL MEDICATIONS USED:  NONE  SPECIMEN:  No Specimen  DISPOSITION OF SPECIMEN:  N/A  COUNTS:  YES  TOURNIQUET:  * No tourniquets in log *  DICTATION: .Other Dictation: Dictation Number [id number]  PLAN OF CARE: Discharge to home after PACU  PATIENT DISPOSITION:  PACU - hemodynamically stable.   Delay start of Pharmacological VTE agent (>24hrs) due to surgical blood loss or risk of bleeding: not applicable

## 2015-11-07 NOTE — H&P (Signed)
KYLIAH DEANDA is an 60 y.o. female.   Chief Complaint: RIGHT WRIST INJURY WITH DIFFICULTY FOREARM ROTATION HPI: LONGSTANDING PATIENT WITH DISTAL RADIUS FRACTURE PT UNDERWENT ORIF AND HARDWARE REMOVAL PT WITH DISTAL RADIOULNAR JOINT DYSFUNCTION AND ANKYLOSIS PT HERE FOR SURGERY TO HELP IMPROVE FOREARM ROTATION  Past Medical History  Diagnosis Date  . Anxiety 2009  . Arthritis 2000  . Fibroid 2003  . Hot flashes, menopausal 2012  . Allergy     seasonal  . RLS (restless legs syndrome)   . Wears glasses   . Depression   . Shortness of breath     with exertion denies    Past Surgical History  Procedure Laterality Date  . Neck surgery  2003    Cervical vertebroplasty   . Colonoscopy    . Open reduction internal fixation (orif) distal radial fracture Right 11/08/2012    Procedure: OPEN REDUCTION INTERNAL FIXATION (ORIF) RIGHT DISTAL RADIUS FRACTURE;  Surgeon: Linna Hoff, MD;  Location: [facility];  Service: Orthopedics;  Laterality: Right;  . Hardware removal Right 07/24/2013    Procedure: RIGHT WRIST HARDWARE REMOVAL, JOINT RELEASE, RIGHT HAND MANIPULATION UNDER ANESTHESIA;  Surgeon: Linna Hoff, MD;  Location: [facility];  Service: Orthopedics;  Laterality: Right;  . Wrist arthroscopy with debridement Right 07/21/2014    Procedure: RIGHT WRIST DISTAL RADIAL ULNAR JOINT DEBRIDEMENT AND JOINT RELEASE POSSIBLE TENDON INTERPOSITION;  Surgeon: Linna Hoff, MD;  Location: [facility];  Service: Orthopedics;  Laterality: Right;    Family History  Problem Relation Age of Onset  . Hypertension Mother   . Hyperlipidemia Mother   . Early death Sister 70    Drug overdose  . Dementia Father   . Cancer Father     Throat cancer   . Hypertension Daughter    Social History:  reports that she has been smoking Cigarettes.  She has a 41 pack-year smoking history. She has never used smokeless tobacco. She reports that she drinks alcohol. She reports that she does  not use illicit drugs.  Allergies:  Allergies  Allergen Reactions  . Codeine Nausea And Vomiting and Other (See Comments)    Hallucinations    No prescriptions prior to admission    No results found for this or any previous visit (from the past 48 hour(s)). No results found.  ROS NO RECENT ILLNESSES OR HOSPITALIZATIONS  There were no vitals taken for this visit. Physical Exam  General Appearance:  Alert, cooperative, no distress, appears stated age  Head:  Normocephalic, without obvious abnormality, atraumatic  Eyes:  Pupils equal, conjunctiva/corneas clear,         Throat: Lips, mucosa, and tongue normal; teeth and gums normal  Neck: No visible masses     Lungs:   respirations unlabored  Chest Wall:  No tenderness or deformity  Heart:  Regular rate and rhythm,  Abdomen:   Soft, non-tender,         Extremities: RIGHT HAND: LIMITED MOBILITY OF FINGERS AT IP JOINTS, LITTLE FOREARM ROTATION POOR WRIST FLEXION AND EXTENSION FINGERS WARM WELL PERFUSED  Pulses: 2+ and symmetric  Skin: Skin color, texture, turgor normal, no rashes or lesions     Neurologic: Normal    Assessment/Plan RIGHT FOREARM DISTAL RADIOULNAR JOINT ANKYLOSIS AND LIMITED FOREARM ROTATION  RIGHT WRIST DISTAL RADIOULNAR JOINT DEBRIDEMENT AND RECONSTRUCTION AS NECESSARY FINGER JOINT MANIPULATION AND WRIST MANIPULATION  R/B/A DISCUSSED WITH PT IN OFFICE.  PT VOICED UNDERSTANDING OF PLAN CONSENT SIGNED DAY  OF SURGERY PT SEEN AND EXAMINED PRIOR TO OPERATIVE PROCEDURE/DAY OF SURGERY SITE MARKED. QUESTIONS ANSWERED WILL GO HOME FOLLOWING SURGERY  WE ARE PLANNING SURGERY FOR YOUR UPPER EXTREMITY. THE RISKS AND BENEFITS OF SURGERY INCLUDE BUT NOT LIMITED TO BLEEDING INFECTION, DAMAGE TO NEARBY NERVES ARTERIES TENDONS, FAILURE OF SURGERY TO ACCOMPLISH ITS INTENDED GOALS, PERSISTENT SYMPTOMS AND NEED FOR FURTHER SURGICAL INTERVENTION. WITH THIS IN MIND WE WILL PROCEED. I HAVE DISCUSSED WITH THE PATIENT THE  PRE AND POSTOPERATIVE REGIMEN AND THE DOS AND DON'TS. PT VOICED UNDERSTANDING AND INFORMED CONSENT SIGNED.  Linna Hoff 11/09/2015 '@1245'$

## 2015-11-08 MED ORDER — CEFAZOLIN SODIUM-DEXTROSE 2-3 GM-% IV SOLR
2.0000 g | INTRAVENOUS | Status: AC
  Administered 2015-11-09: 2 g via INTRAVENOUS
  Filled 2015-11-08: qty 50

## 2015-11-09 ENCOUNTER — Ambulatory Visit (HOSPITAL_COMMUNITY)
Admission: RE | Admit: 2015-11-09 | Discharge: 2015-11-09 | Disposition: A | Discharge status: Home / Self Care | Payer: Medicare Other | Source: Ambulatory Visit | Attending: Orthopedic Surgery | Admitting: Orthopedic Surgery

## 2015-11-09 ENCOUNTER — Encounter (HOSPITAL_COMMUNITY): Payer: Self-pay | Admitting: Surgery

## 2015-11-09 ENCOUNTER — Encounter (HOSPITAL_COMMUNITY)
Admission: RE | Disposition: A | Discharge status: Home / Self Care | Payer: Self-pay | Source: Ambulatory Visit | Attending: Orthopedic Surgery

## 2015-11-09 ENCOUNTER — Ambulatory Visit (HOSPITAL_COMMUNITY): Payer: Medicare Other | Admitting: Anesthesiology

## 2015-11-09 DIAGNOSIS — F329 Major depressive disorder, single episode, unspecified: Secondary | ICD-10-CM | POA: Insufficient documentation

## 2015-11-09 DIAGNOSIS — X58XXXD Exposure to other specified factors, subsequent encounter: Secondary | ICD-10-CM | POA: Insufficient documentation

## 2015-11-09 DIAGNOSIS — S52531P Colles' fracture of right radius, subsequent encounter for closed fracture with malunion: Secondary | ICD-10-CM | POA: Diagnosis not present

## 2015-11-09 DIAGNOSIS — M25331 Other instability, right wrist: Secondary | ICD-10-CM | POA: Diagnosis not present

## 2015-11-09 DIAGNOSIS — M19031 Primary osteoarthritis, right wrist: Secondary | ICD-10-CM | POA: Diagnosis not present

## 2015-11-09 DIAGNOSIS — M24631 Ankylosis, right wrist: Secondary | ICD-10-CM | POA: Insufficient documentation

## 2015-11-09 DIAGNOSIS — G2581 Restless legs syndrome: Secondary | ICD-10-CM | POA: Diagnosis not present

## 2015-11-09 DIAGNOSIS — M24641 Ankylosis, right hand: Secondary | ICD-10-CM | POA: Diagnosis not present

## 2015-11-09 DIAGNOSIS — S52501P Unspecified fracture of the lower end of right radius, subsequent encounter for closed fracture with malunion: Secondary | ICD-10-CM | POA: Diagnosis not present

## 2015-11-09 DIAGNOSIS — Z79899 Other long term (current) drug therapy: Secondary | ICD-10-CM | POA: Insufficient documentation

## 2015-11-09 DIAGNOSIS — S52561P Barton's fracture of right radius, subsequent encounter for closed fracture with malunion: Secondary | ICD-10-CM | POA: Diagnosis not present

## 2015-11-09 DIAGNOSIS — G8918 Other acute postprocedural pain: Secondary | ICD-10-CM | POA: Diagnosis not present

## 2015-11-09 DIAGNOSIS — F17210 Nicotine dependence, cigarettes, uncomplicated: Secondary | ICD-10-CM | POA: Insufficient documentation

## 2015-11-09 DIAGNOSIS — M1990 Unspecified osteoarthritis, unspecified site: Secondary | ICD-10-CM | POA: Diagnosis not present

## 2015-11-09 HISTORY — PX: ARTHROTOMY: SHX5728

## 2015-11-09 HISTORY — PX: WRIST ARTHROPLASTY: SHX1088

## 2015-11-09 SURGERY — ARTHROTOMY
Anesthesia: Regional | Site: Wrist | Laterality: Right

## 2015-11-09 MED ORDER — FENTANYL CITRATE (PF) 100 MCG/2ML IJ SOLN
INTRAMUSCULAR | Status: AC
  Administered 2015-11-09: 100 ug
  Filled 2015-11-09: qty 2

## 2015-11-09 MED ORDER — LACTATED RINGERS IV SOLN
INTRAVENOUS | Status: DC
  Administered 2015-11-09 (×2): via INTRAVENOUS

## 2015-11-09 MED ORDER — OXYCODONE-ACETAMINOPHEN 10-325 MG PO TABS: 1.0000 | ORAL_TABLET | ORAL | Status: DC | PRN

## 2015-11-09 MED ORDER — HYDROCODONE-ACETAMINOPHEN 7.5-325 MG PO TABS: 1.0000 | ORAL_TABLET | Freq: Once | ORAL | Status: DC | PRN

## 2015-11-09 MED ORDER — FENTANYL CITRATE (PF) 100 MCG/2ML IJ SOLN
INTRAMUSCULAR | Status: DC | PRN
  Administered 2015-11-09: 50 ug via INTRAVENOUS
  Administered 2015-11-09: 100 ug via INTRAVENOUS

## 2015-11-09 MED ORDER — KETOROLAC TROMETHAMINE 30 MG/ML IJ SOLN
30.0000 mg | Freq: Once | INTRAMUSCULAR | Status: DC
Start: 2015-11-09 — End: 2015-11-09

## 2015-11-09 MED ORDER — SUCCINYLCHOLINE CHLORIDE 20 MG/ML IJ SOLN
INTRAMUSCULAR | Status: DC | PRN
  Administered 2015-11-09: 100 mg via INTRAVENOUS

## 2015-11-09 MED ORDER — PHENYLEPHRINE HCL 10 MG/ML IJ SOLN
INTRAMUSCULAR | Status: DC | PRN
  Administered 2015-11-09 (×3): 80 ug via INTRAVENOUS

## 2015-11-09 MED ORDER — DOCUSATE SODIUM 100 MG PO CAPS: 100.0000 mg | ORAL_CAPSULE | Freq: Two times a day (BID) | ORAL | Status: DC

## 2015-11-09 MED ORDER — CHLORHEXIDINE GLUCONATE 4 % EX LIQD: 60.0000 mL | Freq: Once | CUTANEOUS | Status: DC

## 2015-11-09 MED ORDER — METHOCARBAMOL 500 MG PO TABS: 500.0000 mg | ORAL_TABLET | Freq: Four times a day (QID) | ORAL | Status: DC

## 2015-11-09 MED ORDER — EPHEDRINE SULFATE 50 MG/ML IJ SOLN
INTRAMUSCULAR | Status: DC | PRN
  Administered 2015-11-09 (×3): 10 mg via INTRAVENOUS

## 2015-11-09 MED ORDER — FENTANYL CITRATE (PF) 250 MCG/5ML IJ SOLN
INTRAMUSCULAR | Status: AC
  Filled 2015-11-09: qty 5

## 2015-11-09 MED ORDER — PROPOFOL 10 MG/ML IV BOLUS
INTRAVENOUS | Status: DC | PRN
  Administered 2015-11-09: 100 mg via INTRAVENOUS
  Administered 2015-11-09: 20 mg via INTRAVENOUS
  Administered 2015-11-09: 200 mg via INTRAVENOUS
  Administered 2015-11-09: 100 mg via INTRAVENOUS
  Administered 2015-11-09: 80 mg via INTRAVENOUS
  Administered 2015-11-09: 200 mg via INTRAVENOUS

## 2015-11-09 MED ORDER — PROMETHAZINE HCL 25 MG/ML IJ SOLN: 6.2500 mg | INTRAMUSCULAR | Status: DC | PRN

## 2015-11-09 MED ORDER — VITAMIN C 500 MG PO TABS: 500.0000 mg | ORAL_TABLET | Freq: Every day | ORAL | Status: DC

## 2015-11-09 MED ORDER — 0.9 % SODIUM CHLORIDE (POUR BTL) OPTIME
TOPICAL | Status: DC | PRN
  Administered 2015-11-09: 1000 mL

## 2015-11-09 MED ORDER — MIDAZOLAM HCL 2 MG/2ML IJ SOLN
INTRAMUSCULAR | Status: AC
  Administered 2015-11-09: 2 mg
  Filled 2015-11-09: qty 2

## 2015-11-09 MED ORDER — BUPIVACAINE-EPINEPHRINE (PF) 0.5% -1:200000 IJ SOLN
INTRAMUSCULAR | Status: DC | PRN
  Administered 2015-11-09: 30 mL via PERINEURAL

## 2015-11-09 MED ORDER — DEXAMETHASONE SODIUM PHOSPHATE 10 MG/ML IJ SOLN
INTRAMUSCULAR | Status: DC | PRN
  Administered 2015-11-09: 4 mg via INTRAVENOUS

## 2015-11-09 MED ORDER — ONDANSETRON HCL 4 MG/2ML IJ SOLN
INTRAMUSCULAR | Status: DC | PRN
  Administered 2015-11-09: 4 mg via INTRAVENOUS

## 2015-11-09 MED ORDER — HYDROMORPHONE HCL 1 MG/ML IJ SOLN: 0.2500 mg | INTRAMUSCULAR | Status: DC | PRN

## 2015-11-09 SURGICAL SUPPLY — 56 items
ANCHOR FT CORKSCREW MICRO 2-0 (Anchor) ×6 IMPLANT
BANDAGE ELASTIC 3 VELCRO ST LF (GAUZE/BANDAGES/DRESSINGS) IMPLANT
BANDAGE ELASTIC 4 VELCRO ST LF (GAUZE/BANDAGES/DRESSINGS) IMPLANT
BLADE LONG MED 31MMX9MM (MISCELLANEOUS) ×1
BLADE LONG MED 31X9 (MISCELLANEOUS) ×2 IMPLANT
BLADE SURG ROTATE 9660 (MISCELLANEOUS) IMPLANT
BNDG ESMARK 4X9 LF (GAUZE/BANDAGES/DRESSINGS) ×3 IMPLANT
BNDG GAUZE ELAST 4 BULKY (GAUZE/BANDAGES/DRESSINGS) IMPLANT
CLOSURE WOUND 1/2 X4 (GAUZE/BANDAGES/DRESSINGS)
CORDS BIPOLAR (ELECTRODE) ×3 IMPLANT
COVER SURGICAL LIGHT HANDLE (MISCELLANEOUS) ×3 IMPLANT
CUFF TOURNIQUET SINGLE 18IN (TOURNIQUET CUFF) ×3 IMPLANT
CUFF TOURNIQUET SINGLE 24IN (TOURNIQUET CUFF) IMPLANT
DRAIN TLS ROUND 10FR (DRAIN) IMPLANT
DRAPE OEC MINIVIEW 54X84 (DRAPES) ×3 IMPLANT
DRAPE SURG 17X11 SM STRL (DRAPES) ×3 IMPLANT
DRSG ADAPTIC 3X8 NADH LF (GAUZE/BANDAGES/DRESSINGS) ×3 IMPLANT
ELECT REM PT RETURN 9FT ADLT (ELECTROSURGICAL)
ELECTRODE REM PT RTRN 9FT ADLT (ELECTROSURGICAL) IMPLANT
GAUZE SPONGE 4X4 12PLY STRL (GAUZE/BANDAGES/DRESSINGS) ×3 IMPLANT
GAUZE SPONGE 4X4 16PLY XRAY LF (GAUZE/BANDAGES/DRESSINGS) ×3 IMPLANT
GLOVE BIOGEL PI IND STRL 8.5 (GLOVE) ×1 IMPLANT
GLOVE BIOGEL PI INDICATOR 8.5 (GLOVE) ×2
GLOVE SURG ORTHO 8.0 STRL STRW (GLOVE) ×3 IMPLANT
GOWN STRL REUS W/ TWL LRG LVL3 (GOWN DISPOSABLE) ×3 IMPLANT
GOWN STRL REUS W/ TWL XL LVL3 (GOWN DISPOSABLE) ×1 IMPLANT
GOWN STRL REUS W/TWL LRG LVL3 (GOWN DISPOSABLE) ×6
GOWN STRL REUS W/TWL XL LVL3 (GOWN DISPOSABLE) ×2
KIT BASIN OR (CUSTOM PROCEDURE TRAY) ×3 IMPLANT
KIT ROOM TURNOVER OR (KITS) ×3 IMPLANT
MANIFOLD NEPTUNE II (INSTRUMENTS) ×3 IMPLANT
NEEDLE HYPO 25X1 1.5 SAFETY (NEEDLE) ×3 IMPLANT
NS IRRIG 1000ML POUR BTL (IV SOLUTION) ×3 IMPLANT
PACK ORTHO EXTREMITY (CUSTOM PROCEDURE TRAY) ×3 IMPLANT
PAD ARMBOARD 7.5X6 YLW CONV (MISCELLANEOUS) ×6 IMPLANT
PAD CAST 4YDX4 CTTN HI CHSV (CAST SUPPLIES) ×1 IMPLANT
PADDING CAST COTTON 4X4 STRL (CAST SUPPLIES) ×2
PASSER SUT SWANSON 36MM LOOP (INSTRUMENTS) ×6 IMPLANT
SLING ARM FOAM STRAP SML (SOFTGOODS) ×3 IMPLANT
SOAP 2 % CHG 4 OZ (WOUND CARE) ×3 IMPLANT
STRIP CLOSURE SKIN 1/2X4 (GAUZE/BANDAGES/DRESSINGS) IMPLANT
SUT ETHILON 4 0 PS 2 18 (SUTURE) IMPLANT
SUT MNCRL AB 4-0 PS2 18 (SUTURE) IMPLANT
SUT VIC AB 2-0 CT1 27 (SUTURE) ×2
SUT VIC AB 2-0 CT1 TAPERPNT 27 (SUTURE) ×1 IMPLANT
SUT VIC AB 2-0 FS1 27 (SUTURE) IMPLANT
SUT VIC AB 3-0 FS2 27 (SUTURE) ×3 IMPLANT
SUT VICRYL 4-0 PS2 18IN ABS (SUTURE) IMPLANT
SYR CONTROL 10ML LL (SYRINGE) IMPLANT
SYSTEM CHEST DRAIN TLS 7FR (DRAIN) IMPLANT
TOWEL OR 17X24 6PK STRL BLUE (TOWEL DISPOSABLE) IMPLANT
TOWEL OR 17X26 10 PK STRL BLUE (TOWEL DISPOSABLE) ×3 IMPLANT
TUBE CONNECTING 12'X1/4 (SUCTIONS) ×1
TUBE CONNECTING 12X1/4 (SUCTIONS) ×2 IMPLANT
WATER STERILE IRR 1000ML POUR (IV SOLUTION) IMPLANT
YANKAUER SUCT BULB TIP NO VENT (SUCTIONS) IMPLANT

## 2015-11-09 NOTE — Transfer of Care (Signed)
Immediate Anesthesia Transfer of Care Note  Patient: [name]  Procedure(s) Performed: Procedure(s): RIGHT WRIST DISTAL RADIAL ULNAR JOINT ARTHROTOMY AND DEBRIDEMENT,  AND  (Right) OR DISTAL ULNAR RESECTION ARTHROPLASTY  (Right)  Patient Location: PACU  Anesthesia Type:GA combined with regional for post-op pain  Level of Consciousness: awake and alert   Airway & Oxygen Therapy: Patient Spontanous Breathing and Patient connected to nasal cannula oxygen  Post-op Assessment: Report given to RN and Post -op Vital signs reviewed and stable  Post vital signs: Reviewed and stable  Last Vitals:  Filed Vitals:   11/09/15 1215 11/09/15 1451  BP:  135/76  Pulse: 78 121  Temp:    Resp: 17 20    Complications: No apparent anesthesia complications

## 2015-11-09 NOTE — Anesthesia Postprocedure Evaluation (Signed)
Anesthesia Post Note  Patient: [name]  Procedure(s) Performed: Procedure(s) (LRB): RIGHT WRIST DISTAL RADIAL ULNAR JOINT ARTHROTOMY AND DEBRIDEMENT,  AND  (Right) OR DISTAL ULNAR RESECTION ARTHROPLASTY  (Right)  Patient location during evaluation: PACU Anesthesia Type: General and Regional Level of consciousness: awake and alert Pain management: pain level controlled Vital Signs Assessment: post-procedure vital signs reviewed and stable Respiratory status: spontaneous breathing, nonlabored ventilation, respiratory function stable and patient connected to nasal cannula oxygen Cardiovascular status: blood pressure returned to baseline and stable Postop Assessment: no signs of nausea or vomiting Anesthetic complications: no    Last Vitals:  Filed Vitals:   11/09/15 1504 11/09/15 1521  BP: 136/82 160/78  Pulse: 107 102  Temp:    Resp: 26 32    Last Pain:  Filed Vitals:   11/09/15 1522  PainSc: 0-No pain                 Effie Berkshire

## 2015-11-09 NOTE — Anesthesia Preprocedure Evaluation (Addendum)
Anesthesia Evaluation  Patient identified by MRN, date of birth, ID band Patient awake    Reviewed: Allergy & Precautions, NPO status , Patient's Chart, lab work & pertinent test results  Airway Mallampati: II  TM Distance: >3 FB Neck ROM: Full    Dental  (+) Dental Advisory Given   Pulmonary Current Smoker,    breath sounds clear to auscultation       Cardiovascular negative cardio ROS   Rhythm:Regular Rate:Normal     Neuro/Psych Anxiety Depression negative neurological ROS     GI/Hepatic negative GI ROS, Neg liver ROS,   Endo/Other  negative endocrine ROS  Renal/GU negative Renal ROS     Musculoskeletal  (+) Arthritis ,   Abdominal   Peds  Hematology negative hematology ROS (+)   Anesthesia Other Findings   Reproductive/Obstetrics                            Lab Results  Component Value Date   WBC 9.7 11/02/2015   HGB 13.1 11/02/2015   HCT 41.6 11/02/2015   MCV 87.2 11/02/2015   PLT 394 11/02/2015   Lab Results  Component Value Date   CREATININE 0.95 02/10/2015   BUN 21 02/10/2015   NA 140 02/10/2015   K 4.6 02/10/2015   CL 104 02/10/2015   CO2 27 02/10/2015    Anesthesia Physical Anesthesia Plan  ASA: II  Anesthesia Plan: General and Regional   Post-op Pain Management: GA combined w/ Regional for post-op pain   Induction: Intravenous  Airway Management Planned: LMA  Additional Equipment:   Intra-op Plan:   Post-operative Plan:   Informed Consent: I have reviewed the patients History and Physical, chart, labs and discussed the procedure including the risks, benefits and alternatives for the proposed anesthesia with the patient or authorized representative who has indicated his/her understanding and acceptance.     Plan Discussed with: CRNA  Anesthesia Plan Comments:         Anesthesia Quick Evaluation

## 2015-11-09 NOTE — Anesthesia Procedure Notes (Addendum)
Anesthesia Regional Block:  Axillary brachial plexus block  Pre-Anesthetic Checklist: ,, timeout performed, Correct Patient, Correct Site, Correct Laterality, Correct Procedure, Correct Position, site marked, Risks and benefits discussed,  Surgical consent,  Pre-op evaluation,  At surgeon's request and post-op pain management  Laterality: Right  Prep: chloraprep       Needles:  Injection technique: Single-shot  Needle Type: Echogenic Stimulator Needle     Needle Length: 9cm 9 cm Needle Gauge: 21 and 21 G    Additional Needles:  Procedures: ultrasound guided (picture in chart) and nerve stimulator Axillary brachial plexus block  Nerve Stimulator or Paresthesia:  Response: median, 0.5 mA,  Response: radial, 0.5 mA,  Response: ulnar, 0.5 mA,   Additional Responses:  Musculocutaneous at 0.40m. Narrative:  Start time: 11/09/2015 11:56 AM End time: 11/09/2015 12:06 PM Injection made incrementally with aspirations every 5 mL.  Performed by: Personally  Anesthesiologist: FSuzette Battiest  Procedure Name: LMA Insertion Date/Time: 11/09/2015 1:30 PM Performed by: FOllen BowlPre-anesthesia Checklist: Patient identified, Emergency Drugs available, Suction available, Patient being monitored and Timeout performed Patient Re-evaluated:Patient Re-evaluated prior to inductionOxygen Delivery Method: Circle system utilized Preoxygenation: Pre-oxygenation with 100% oxygen Intubation Type: IV induction Ventilation: Mask ventilation without difficulty LMA: LMA inserted LMA Size: 4.0 Grade View: Grade II Number of attempts: 1 Placement Confirmation: positive ETCO2,  CO2 detector and breath sounds checked- equal and bilateral    Procedure Name: Intubation Date/Time: 11/09/2015 2:00 PM Performed by: FOllen BowlPre-anesthesia Checklist: Patient identified, Emergency Drugs available, Suction available, Patient being monitored and Timeout performed Patient  Re-evaluated:Patient Re-evaluated prior to inductionOxygen Delivery Method: Circle system utilized and Simple face mask Preoxygenation: Pre-oxygenation with 100% oxygen Intubation Type: Combination inhalational/ intravenous induction Ventilation: Mask ventilation without difficulty Laryngoscope Size: Mac and 3 Grade View: Grade I Tube type: Oral Tube size: 7.0 mm Number of attempts: 1 Airway Equipment and Method: Patient positioned with wedge pillow and Stylet Placement Confirmation: ETT inserted through vocal cords under direct vision,  positive ETCO2 and breath sounds checked- equal and bilateral Secured at: 22 cm Tube secured with: Tape Dental Injury: Teeth and Oropharynx as per pre-operative assessment

## 2015-11-09 NOTE — Discharge Instructions (Signed)
KEEP BANDAGE CLEAN AND DRY °CALL OFFICE FOR F/U APPT [phone] in 14 days °DR Dorthia Tout CELL [phone] °KEEP HAND ELEVATED ABOVE HEART °OK TO APPLY ICE TO OPERATIVE AREA °CONTACT OFFICE IF ANY WORSENING PAIN OR CONCERNS. °

## 2015-11-10 ENCOUNTER — Encounter (HOSPITAL_COMMUNITY): Payer: Self-pay | Admitting: Orthopedic Surgery

## 2015-11-10 NOTE — Op Note (Signed)
NAMEMELANIA, [name]                ACCOUNT NO.:  [id number]  MEDICAL RECORD NO.:  [id number]  LOCATION:  [facility]                         FACILITY:  [facility]  PHYSICIAN:  Linna Hoff IV, M.D.DATE OF BIRTH:  11-Dec-1955  DATE OF PROCEDURE:  11/09/2015 DATE OF DISCHARGE:  11/09/2015                              OPERATIVE REPORT   PREOPERATIVE DIAGNOSES: 1. 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 2. Right distal radius malunion. 3. 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  POSTOPERATIVE DIAGNOSES: 1. 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 2. Right distal radius malunion. 3. 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  ATTENDING PHYSICIAN:  Linna Hoff IV, MD, who was scrubbed and present for the entire procedure.  ASSISTANT SURGEON:  Narda Amber, [facility], who was scrubbed and necessary for the entire procedure.  SURGICAL PROCEDURE: 1. Right ulna osteoplasty and resection of 1 cm of distal ulnar shaft. 2. Right wrist stabilization and extensor carpi ulnaris tendon     transfer and tenodesis. 3. Radiographs 3 views, right wrist.  SURGICAL INDICATIONS:  [name] is a right-hand-dominant female who has a complicated history after sustaining a distal radius fracture. The patient did have ankylosis and inability of the distal radioulnar joint.  It is recommended that she undergo the above procedure.  Risks, benefits, and alternatives were discussed in detail with the patient and a signed informed consent was obtained.  Risks include, but not limited to bleeding, infection, damage to nearby nerves, arteries, or tendons, loss of motion of the wrist and digits, instability of the radius and ulna, and need for further surgical intervention.  DESCRIPTION OF PROCEDURE:  The patient was properly identified in the preop holding area, mark with a permanent marker made on the right wrist to indicate correct operative  site.  The patient was brought back to the operating room, placed supine on the anesthesia table.  General anesthetic was administered.  Examination under anesthesia revealed the stuck wrist without any passive pronation and supination and without any instability of the distal radioulnar joint.  A well-padded tourniquet placed in the right brachium, sealed with 1000 drape.  Right upper extremity was then prepped and draped in normal sterile fashion.  A time- out was called, the correct side was identified, and the procedure was begun.  Attention was then turned to the right wrist.  Incision was made between the fifth and sixth dorsal compartments.  Limb was then elevated and tourniquet inflated.  Dissection was carried down through skin and subcutaneous tissue.  The interval between the fourth and fifth extensor compartments was then carried out given the subluxation of the sixth dorsal compartment volarly.  The extensor tendons were then carefully retracted out to length.  The ulna was then subperiosteally dissected and large capsular flaps were then elevated off the ulna exposing the distal radioulnar joint.  The patient did have near ankylosis, did not have any active movement at the DRUJ, and had a near auto fusion at this joint.  Based on the auto fusion, the decision was then made to complete the Sauve- Kapandji procedure with resection of 1 cm of the ulna proximally and this was then carried  out nicely after removal of the bone segment at this time with a small oscillating saw.  Once this was carried out, stabilization of the stump was then carried out with an ECU tenodesis. Tenodesis was then carried out taking a distally based stump of the ECU and then bringing it back through the bone and then sewing it back on itself and reinforcing it with a FiberWire suture.  The ECU was in slightly volar position but not unstable.  The wound was then irrigated.  After thorough wound  irrigation, this completed the arthroplasty and tendon transfer.  Two mini Arthrex corkscrew anchors were then placed in the radius __________ then tied down nicely over the bone.  The retinaculum was then closed with 2-0 Vicryl.  The subcutaneous tissues closed with 4-0 Vicryl and the skin closed with a running 4-0 Prolene.  Steri-Strips were applied, and sterile compressive bandage then applied.  The patient was placed in a short-arm volar splint, extubated, and taken to recovery room in good condition.  The patient did also undergo manipulation under anesthesia of the finger joints.  The patient did have the extrinsic tightness, and I was able to get her into a full disposition intraoperatively.  Intraoperative radiographs, 2 views of the wrist did show the suture anchor fixation in place there is of the ankylosis and arthrodesis of the DRUJ with the resection of the distal ulna.  PLAN:  The patient will be discharged home, seen back in the office in approximately 2 weeks for wound check, suture removal, x-rays, and then begin an outpatient therapy regimen.  We will try to get her into therapy working on her digits.  I definitely do think the same, and am optimistic this will help with her forearm rotation.  She is still going to have limitations with the VISI deformity of the radius and may require further wrist arthrodesis in the future.  Her limited wrist arthrodesis, I think the goal is to try to get her forearm moving, and we will address the wrist and the digits as we continue to treat her.     Melrose Nakayama, M.D.     FWO/MEDQ  D:  11/09/2015  T:  11/09/2015  Job:  [id number]

## 2015-11-11 ENCOUNTER — Encounter: Payer: Self-pay | Admitting: Internal Medicine

## 2015-11-11 ENCOUNTER — Ambulatory Visit (INDEPENDENT_AMBULATORY_CARE_PROVIDER_SITE_OTHER): Payer: Medicare Other | Admitting: Internal Medicine

## 2015-11-11 VITALS — BP 143/74 | HR 96 | Temp 97.7°F | Ht 65.5 in | Wt 168.2 lb

## 2015-11-11 DIAGNOSIS — R52 Pain, unspecified: Secondary | ICD-10-CM | POA: Insufficient documentation

## 2015-11-11 LAB — INFLUENZA PANEL BY PCR (TYPE A & B)
H1N1 flu by pcr: NOT DETECTED
INFLAPCR: NEGATIVE
INFLBPCR: NEGATIVE

## 2015-11-11 NOTE — Assessment & Plan Note (Signed)
Pt with body aches, sore throat, cough. Recent exposure to nephew, who has the flu. No signs of bacterial infection on exam. Pt did receive a flu shot this year.  - Will order flu test today. - If positive, will prescribe Tamiflu given that her symptoms started last night - If negative, advised symptomatic care with hydration, Tylenol, tea, etc - Follow-up as needed or if she fails to improve

## 2015-11-11 NOTE — Progress Notes (Signed)
[facility] Phone: [phone]  Subjective:  She had surgery on her right wrist Monday. She started feeling bad last night with diffuse body aches, cough, sore throat. Cough is non-productive. Body aches are worse with coughing. She feels very tired. No fevers, no nausea, no vomiting, no diarrhea, no shortness of breath, no congestion, no runny nose. She got a flu shot this year. Her nephew has the flu and she was around him this past weekend. She has been taking the Norco that she was prescribed after the surgery, which helped mildly. She also tried Tylenol arthritis, but it didn't help.   ROS: See HPI for pertinent positives and negatives Past Medical History- recent distal radius fracture with surgery on 2/27. Reviewed problem list.  Medications- reviewed and updated Current Outpatient Prescriptions  Medication Sig Dispense Refill  . acetaminophen (TYLENOL) 650 MG CR tablet Take 650 mg by mouth every 8 (eight) hours as needed for pain.    . carbamide peroxide (DEBROX) 6.5 % otic solution Place 5 drops into both ears 2 (two) times daily. 15 mL 0  . clobetasol cream (TEMOVATE) 0.86 % Apply 1 application topically 2 (two) times daily as needed. (Patient taking differently: Apply 1 application topically 2 (two) times daily as needed (affected area). ) 30 g 2  . clotrimazole (LOTRIMIN) 1 % cream Apply 1 application topically 2 (two) times daily. (Patient not taking: Reported on 10/30/2015) 30 g 0  . cyclobenzaprine (FLEXERIL) 10 MG tablet Take 1 tablet (10 mg total) by mouth 2 (two) times daily as needed for muscle spasms. 20 tablet 0  . docusate sodium (COLACE) 100 MG capsule Take 1 capsule (100 mg total) by mouth 2 (two) times daily. (Patient taking differently: Take 100 mg by mouth 2 (two) times daily as needed for mild constipation. ) 30 capsule 0  . docusate sodium (COLACE) 100 MG capsule Take 1 capsule (100 mg total) by mouth 2 (two) times daily. 30 capsule 0  .  docusate sodium (COLACE) 100 MG capsule Take 1 capsule (100 mg total) by mouth 2 (two) times daily. 30 capsule 0  . gabapentin (NEURONTIN) 300 MG capsule Take 1 capsule (300 mg total) by mouth 3 (three) times daily. Start with 1 capsule for three days and then increase to two daily (Patient taking differently: Take 300 mg by mouth 3 (three) times daily. ) 180 capsule 3  . HYDROcodone-acetaminophen (NORCO/VICODIN) 5-325 MG per tablet Take 2 tablets by mouth every 4 (four) hours as needed. (Patient taking differently: Take 1-2 tablets by mouth every 4 (four) hours as needed for moderate pain or severe pain. ) 16 tablet 0  . hydrOXYzine (VISTARIL) 50 MG capsule Take 2 capsules (100 mg total) by mouth at bedtime as needed. 30 capsule 2  . lidocaine (XYLOCAINE) 5 % ointment Apply 1 application topically 4 (four) times daily as needed. (Patient not taking: Reported on 10/30/2015) 35.44 g 1  . loratadine (CLARITIN) 10 MG tablet Take 1 tablet (10 mg total) by mouth daily. (Patient taking differently: Take 10 mg by mouth daily as needed for allergies. ) 30 tablet 11  . LORazepam (ATIVAN) 0.5 MG tablet Take 1 tablet (0.5 mg total) by mouth every 8 (eight) hours as needed for anxiety. 15 tablet 0  . meloxicam (MOBIC) 15 MG tablet Take 1 tablet (15 mg total) by mouth daily. (Patient not taking: Reported on 10/30/2015) 20 tablet 0  . methocarbamol (ROBAXIN) 500 MG tablet Take 1 tablet (500 mg total)  by mouth 4 (four) times daily. 30 tablet 0  . methocarbamol (ROBAXIN) 500 MG tablet Take 1 tablet (500 mg total) by mouth 4 (four) times daily. 30 tablet 0  . mometasone (NASONEX) 50 MCG/ACT nasal spray Place 2 sprays into the nose daily. (Patient taking differently: Place 2 sprays into the nose daily as needed (allergies). ) 17 g 12  . oxyCODONE-acetaminophen (PERCOCET) 10-325 MG tablet Take 1 tablet by mouth every 4 (four) hours as needed for pain. 40 tablet 0  . oxyCODONE-acetaminophen (PERCOCET) 10-325 MG tablet Take 1  tablet by mouth every 4 (four) hours as needed for pain. 40 tablet 0  . sertraline (ZOLOFT) 50 MG tablet Take 1 tablet (50 mg total) by mouth daily. 30 tablet 3  . traZODone (DESYREL) 50 MG tablet TAKE 1/2 TO 1 TABLET BY MOUTH EVERY DAY AT BEDTIME AS NEEDED FOR SLEEP (Patient taking differently: Take 25-50 mg by mouth at bedtime as needed for sleep. ) 60 tablet 2  . valACYclovir (VALTREX) 1000 MG tablet Take 2 tablets (2,000 mg total) by mouth 2 (two) times daily. 4 tablet 0  . vitamin C (ASCORBIC ACID) 500 MG tablet Take 1 tablet (500 mg total) by mouth daily. 50 tablet 0  . vitamin C (ASCORBIC ACID) 500 MG tablet Take 1 tablet (500 mg total) by mouth daily. 50 tablet 0   No current facility-administered medications for this visit.   Chief complaint-noted Family history reviewed for today's visit. No changes. Social history- patient is a current smoker.  Objective: BP 143/74 mmHg  Pulse 96  Temp(Src) 97.7 F (36.5 C) (Oral)  Ht 5' 5.5" (1.664 m)  Wt 168 lb 3.2 oz (76.295 kg)  BMI 27.55 kg/m2 Gen: Ill-appearing but non-toxic, NAD HEENT: NCAT, EOMI, oropharynx mildly erythematous, TMs clear, MMM Neck: FROM, supple, no cervical lymphadenopathy CV: RRR, no murmur Resp: CTABL, no wheezes, normal work of breathing Msk: No edema Neuro: Alert and oriented, no gross deficits Skin: No rashes, no lesions Psych: Appropriate behavior  Assessment/Plan: Body aches: Pt with body aches, sore throat, cough. Recent exposure to nephew, who has the flu. No signs of bacterial infection on exam. Pt did receive a flu shot this year.  - Will order flu test today. - If positive, will prescribe Tamiflu given that her symptoms started last night - If negative, advised symptomatic care with hydration, Tylenol, tea, etc - Follow-up as needed or if she fails to improve   Hyman Bible, MD PGY-1

## 2015-11-11 NOTE — Patient Instructions (Signed)
We have tested you for the flu. We will have your result in a couple of hours and I will give you a call to let you know the result. If it is positive, I will send a prescription for Tamiflu into your pharmacy.  Please follow-up with your primary doctor as needed.  - Dr. Brett Albino

## 2015-11-20 ENCOUNTER — Encounter (HOSPITAL_COMMUNITY): Payer: Self-pay | Admitting: Orthopedic Surgery

## 2015-11-23 ENCOUNTER — Ambulatory Visit: Payer: Medicare Other | Admitting: Student

## 2015-11-23 DIAGNOSIS — Z4789 Encounter for other orthopedic aftercare: Secondary | ICD-10-CM | POA: Diagnosis not present

## 2015-11-25 ENCOUNTER — Encounter: Payer: Self-pay | Admitting: Occupational Therapy

## 2015-11-25 ENCOUNTER — Ambulatory Visit: Payer: Medicare Other | Attending: Orthopedic Surgery | Admitting: Occupational Therapy

## 2015-11-25 DIAGNOSIS — M25431 Effusion, right wrist: Secondary | ICD-10-CM | POA: Insufficient documentation

## 2015-11-25 DIAGNOSIS — M25641 Stiffness of right hand, not elsewhere classified: Secondary | ICD-10-CM

## 2015-11-25 DIAGNOSIS — M25531 Pain in right wrist: Secondary | ICD-10-CM | POA: Insufficient documentation

## 2015-11-25 DIAGNOSIS — M6289 Other specified disorders of muscle: Secondary | ICD-10-CM | POA: Insufficient documentation

## 2015-11-25 DIAGNOSIS — R29898 Other symptoms and signs involving the musculoskeletal system: Secondary | ICD-10-CM

## 2015-11-25 DIAGNOSIS — M25631 Stiffness of right wrist, not elsewhere classified: Secondary | ICD-10-CM | POA: Insufficient documentation

## 2015-11-25 NOTE — Therapy (Signed)
[facility] [address] [place], [place], [address] Phone: [phone]   Fax:  [phone]  Occupational Therapy Evaluation  Patient Details  Name: [name] MRN: [id number] Date of Birth: August 09, 1956 Referring Provider: Dr. Iran Planas  Encounter Date: 11/25/2015      OT End of Session - 11/25/15 1121    Visit Number 1   Number of Visits 17   Date for OT Re-Evaluation 01/23/16   Authorization Type [employer] MCR - G code needed   Authorization - Visit Number 1   Authorization - Number of Visits 10   OT Start Time 0930   OT Stop Time 1015   OT Time Calculation (min) 45 min   Activity Tolerance Patient tolerated treatment well      Past Medical History  Diagnosis Date  . Anxiety 2009  . Arthritis 2000  . Fibroid 2003  . Hot flashes, menopausal 2012  . Allergy     seasonal  . RLS (restless legs syndrome)   . Wears glasses   . Depression   . Shortness of breath     with exertion denies    Past Surgical History  Procedure Laterality Date  . Neck surgery  2003    Cervical vertebroplasty   . Colonoscopy    . Open reduction internal fixation (orif) distal radial fracture Right 11/08/2012    Procedure: OPEN REDUCTION INTERNAL FIXATION (ORIF) RIGHT DISTAL RADIUS FRACTURE;  Surgeon: Linna Hoff, MD;  Location: [facility];  Service: Orthopedics;  Laterality: Right;  . Hardware removal Right 07/24/2013    Procedure: RIGHT WRIST HARDWARE REMOVAL, JOINT RELEASE, RIGHT HAND MANIPULATION UNDER ANESTHESIA;  Surgeon: Linna Hoff, MD;  Location: [facility];  Service: Orthopedics;  Laterality: Right;  . Wrist arthroscopy with debridement Right 07/21/2014    Procedure: RIGHT WRIST DISTAL RADIAL ULNAR JOINT DEBRIDEMENT AND JOINT RELEASE POSSIBLE TENDON INTERPOSITION;  Surgeon: Linna Hoff, MD;  Location: [facility];  Service: Orthopedics;  Laterality: Right;  . Arthrotomy Right 11/09/2015   Procedure: RIGHT WRIST DISTAL RADIAL ULNAR JOINT ARTHROTOMY AND DEBRIDEMENT,  AND ;  Surgeon: Iran Planas, MD;  Location: [facility];  Service: Orthopedics;  Laterality: Right;  . Wrist arthroplasty Right 11/09/2015    Procedure: OR DISTAL ULNAR RESECTION ARTHROPLASTY ;  Surgeon: Iran Planas, MD;  Location: [facility];  Service: Orthopedics;  Laterality: Right;    There were no vitals filed for this visit.  Visit Diagnosis:  Stiffness of wrist joint, right - Plan: Ot plan of care cert/re-cert  Stiffness of joint, forearm, right - Plan: Ot plan of care cert/re-cert  Stiffness of joint, hand, right - Plan: Ot plan of care cert/re-cert  Weakness of right hand - Plan: Ot plan of care cert/re-cert  Pain and swelling of wrist, right - Plan: Ot plan of care cert/re-cert      Subjective Assessment - 11/25/15 0934    Subjective  This problem has been for a long time   Pertinent History 11/09/15: Rt ulna osteoplasty with resection of 1 cm of distal ulnar shaft, Rt wrist stabalization and ECU tendon transfer and tenolysis due to Rt distal radius malunion. 07/21/14: Rt wrist arthoscopy and tendon releases. 11/08/12: ORIF Rt distal radius fx   Patient Stated Goals To get some mobility back in the wrist   Currently in Pain? Yes   Pain Score 5    Pain Location Wrist   Pain Orientation Right   Pain Descriptors /  Indicators Aching   Pain Type Chronic pain   Pain Onset More than a month ago   Pain Frequency Constant   Aggravating Factors  cold weather   Pain Relieving Factors pain meds, elevation           [facility] OT Assessment - 11/25/15 0001    Assessment   Diagnosis Rt ulna osteoplasty and resection of 1 cm of distal ulnar shaft, Rt wrist stabalization and ECU tendon transfer and tenodesis due to Rt distal radius malunion from old injury (original injury 2014)   Referring Provider Dr. Iran Planas   Onset Date 11/09/15  most recent surgery   Assessment Pt arrives with new dorsal wrist incision and  dorsal swelling. Pt appears subluxed at wrist joint but may be due to edema. This is patient's 4th surgery to Rt wrist   Prior Therapy January 2016   Precautions   Precautions --  ? strengthening at this time - MD did not specify   Required Braces or Orthoses --  none   Balance Screen   Has the patient fallen in the past 6 months No   Has the patient had a decrease in activity level because of a fear of falling?  No   Is the patient reluctant to leave their home because of a fear of falling?  No   Home  Environment   Additional Comments Pt lives in mobile home with 6-7 steps to enter    Lives With Spouse   Prior Function   Level of Independence Independent  prior to 2014, needs assist with IADLS since 9379   Vocation On disability   ADL   ADL comments Feeds self I'ly with Rt and Lt hand. Grooming I'ly. Occasional assist with buttons/zippers for dressing. Min assist for bathing (washing hair). Assist to cut and to open tight jars/cans when cooking. Pt performs light cleaning, husband assist with heavier cleaning. Pt has help grocery shopping.    Mobility   Mobility Status Independent   Written Expression   Dominant Hand Right   Handwriting 75% legible   Vision - History   Baseline Vision Wears glasses all the time   Additional Comments denies changes   Sensation   Additional Comments Pt reports occasional numbness in entire hand volarly but not constant   Coordination   9 Hole Peg Test Right;Left   Right 9 Hole Peg Test 41.45 sec   Left 9 Hole Peg Test 21.66 sec   Edema   Edema moderate dorsal distal forearm at incision area   ROM / Strength   AROM / PROM / Strength AROM   AROM   Overall AROM Comments BUE AROM at shoulders, elbow flex/ext WFL's. LUE elbow distally WNL's. RUE: pronation = 80*, supination = -10*. Rt wrist flex = 45*, wrist ext = -10* from neutral position. RD/UD = 15* each way. Pt has only approx. 40% composite finger flexion (mostly at MP's)   Right Hand AROM    R Thumb MCP 0-60 50 Degrees   R Thumb Radial ABduction/ADduction 0-55 40   R Thumb Palmar ABduction/ADduction 0-45 47   R Thumb Opposition to Index --  to 5th digit   R Index  MCP 0-90 70 Degrees   R Index PIP 0-100 55 Degrees   R Long  MCP 0-90 65 Degrees   R Long PIP 0-100 55 Degrees   R Ring  MCP 0-90 55 Degrees   R Ring PIP 0-100 50 Degrees   R Little  MCP  0-90 55 Degrees   R Little PIP 0-100 40 Degrees                         OT Education - 11/25/15 1016    Education provided Yes   Education Details A/ROM HEP for forearm, wrist, fingers, P/ROM HEP for fingers   Person(s) Educated Patient   Methods Explanation;Demonstration;Handout   Comprehension Verbalized understanding;Returned demonstration          OT Short Term Goals - 11/25/15 1240    OT SHORT TERM GOAL #1   Title Indepndent with HEP (due 12/25/15)   Time 4   Period Weeks   Status New   OT SHORT TERM GOAL #2   Title Pt will report pain less than or equal to 4/10 with activity and exercises   Baseline 5/10 at rest   Time 4   Period Weeks   Status New   OT SHORT TERM GOAL #3   Title Pt will consistently be mod I with buttoning and zipping with A/E prn   Time 4   Period Weeks   Status New   OT SHORT TERM GOAL #4   Title Pt will verbalize understanding with potential A/E needs and how to acquire   Time 4   Period Weeks   Status New           OT Long Term Goals - 11/25/15 1242    OT LONG TERM GOAL #1   Title Pt will be I with upgraded HEP prn - due 01/23/16   Time 8   Period Weeks   Status New   OT LONG TERM GOAL #2   Title Pt will improve wrist extension to neutral or greater for functional tasks   Baseline -10*   Time 8   Period Weeks   Status New   OT LONG TERM GOAL #3   Title Pt will demo 50% or greater composite finger flex/ext for gross grasping/releasing   Baseline approx. 40%   Time 8   Period Weeks   Status New   OT LONG TERM GOAL #4   Title Pt to eat 80% or  greater with Rt hand    Baseline approx. 50%   Time 8   Period Weeks   Status New               Plan - 11/25/15 1125    Clinical Impression Statement Pt is a 61 y.o. female who presents to outpatient rehab s/p recent surgery 11/09/15 for Rt ulna osteoplasty and resection as well as ECU tendon transfer and tenodesis due to Rt distal radius malunion. Original injury (Rt distal radius fx) was in 2014 with ORIF 11/08/12. Pt then had hardware removal, and then a 3rd surgery 07/21/14 (wrist arthoscopy and tendon releases, tenolysis). Pt has had residual stiffness of forearm, wrist, and fingers since original injury   Pt will benefit from skilled therapeutic intervention in order to improve on the following deficits (Retired) Decreased coordination;Decreased range of motion;Impaired flexibility;Increased edema;Impaired sensation;Decreased activity tolerance;Decreased knowledge of precautions;Decreased scar mobility;Impaired UE functional use;Pain;Decreased mobility;Decreased strength   OT Frequency 2x / week   OT Duration 8 weeks  plus eval   OT Treatment/Interventions Self-care/ADL training;Therapeutic exercise;Patient/family education;Ultrasound;Neuromuscular education;Manual Therapy;Splinting;Cryotherapy;Parrafin;DME and/or AE instruction;Compression bandaging;Therapeutic activities;Electrical Stimulation;Fluidtherapy;Scar mobilization;Moist Heat;Contrast Bath;Passive range of motion   Plan modalities, compression wrapping for edema, flexion glove?, progress to P/ROM at wrist/forearm if cleared with MD   Consulted and Agree with Plan of  Care Patient          G-Codes - 05-Dec-2015 1246    Functional Assessment Tool Used Rt hand: limited mobility of fingers and wrist, clinical judgment    Functional Limitation Carrying, moving and handling objects   Carrying, Moving and Handling Objects Current Status [phone]) At least 60 percent but less than 80 percent impaired, limited or restricted    Carrying, Moving and Handling Objects Goal Status ([id number]) At least 40 percent but less than 60 percent impaired, limited or restricted      Problem List Patient Active Problem List   Diagnosis Date Noted  . Body aches 11/11/2015  . Insomnia 10/30/2015  . Oral herpes 10/19/2015  . Trichomonas infection 06/08/2015  . Healthcare maintenance 06/08/2015  . Arthritis of spine 03/27/2015  . Fall due to wet surface 03/27/2015  . Dizziness 02/11/2015  . Edema 02/11/2015  . Lumbar spine pain 01/09/2015  . Herpetic lesion 02/07/2014  . Intertriginous candidiasis 02/07/2014  . Depression 07/19/2013  . Distal radius fracture, right 10/20/2012  . Hordeolum 10/03/2012  . Intermittent low back pain 03/30/2012  . Heavy smoker (more than 20 cigarettes per day) 03/30/2012  . Health maintenance examination 03/30/2012  . Hot flashes, menopausal 02/28/2012  . Anxiety 02/28/2012  . Restless leg syndrome 02/28/2012  . Metatarsalgia of both feet 02/28/2012    Carey Bullocks, OTR/L 12/05/2015, 12:51 PM  [facility] [address] [place], [place], [address] Phone: [phone]   Fax:  [phone]  Name: [name] MRN: [id number] Date of Birth: 1956/02/23

## 2015-11-25 NOTE — Patient Instructions (Signed)
AROM: Wrist Extension   .  With __Rt__ palm down, bend wrist up. Repeat __15__ times per set.  Do __4-6__ sessions per day.      AROM: Forearm Pronation / Supination   With _Rt___ arm in handshake position, slowly rotate palm down until stretch is felt. Relax. Then rotate palm up until stretch is felt. Repeat _15___ times per set. Do _4-6___ sessions per day.   Flexor Tendon Gliding (Active Hook Fist)   With fingers and knuckles straight, bend middle and tip joints. Do not bend large knuckles. Repeat _10-15___ times. Do _4-6___ sessions per day.  MP Flexion (Active)   With back of hand on table, bend large knuckles as far as they will go, keeping small joints straight. Repeat _10-15___ times. Do __4-6__ sessions per day. Activity: Reach into a narrow container.*      Finger Flexion / Extension   With palm up, bend fingers of left hand toward palm, making a  fist. Straighten fingers, opening fist. Repeat sequence _10-15___ times per session. Do _4-6__ sessions per day. Hand Variation: Palm down    PROM: Finger MP Joints   Passively bend ___all_____ finger of hand at big knuckle until stretch is felt. Hold _10___ seconds. Relax. Straighten finger as far as possible. Repeat __5__ times per set.  Do __4-6__ sessions per day.   PIP Flexion (Passive)   Use other hand to bend the middle joint of ___all___ finger down as far as possible. Hold _10___ seconds. Repeat __5__ times. Do _4-6___ sessions per day.

## 2015-11-30 ENCOUNTER — Encounter: Payer: Self-pay | Admitting: Occupational Therapy

## 2015-11-30 ENCOUNTER — Ambulatory Visit: Payer: Medicare Other | Admitting: Occupational Therapy

## 2015-11-30 DIAGNOSIS — M25631 Stiffness of right wrist, not elsewhere classified: Secondary | ICD-10-CM

## 2015-11-30 DIAGNOSIS — M25431 Effusion, right wrist: Secondary | ICD-10-CM

## 2015-11-30 DIAGNOSIS — M25641 Stiffness of right hand, not elsewhere classified: Secondary | ICD-10-CM

## 2015-11-30 DIAGNOSIS — M25531 Pain in right wrist: Secondary | ICD-10-CM | POA: Diagnosis not present

## 2015-11-30 DIAGNOSIS — M6289 Other specified disorders of muscle: Secondary | ICD-10-CM | POA: Diagnosis not present

## 2015-11-30 NOTE — Therapy (Signed)
[facility] [address] [place], [place], [address] Phone: [phone]   Fax:  [phone]  Occupational Therapy Treatment  Patient Details  Name: [name] MRN: [id number] Date of Birth: 02-28-1956 Referring Provider: Dr. Iran Planas  Encounter Date: 11/30/2015      OT End of Session - 11/30/15 1321    Visit Number 2   Number of Visits 17   Date for OT Re-Evaluation 01/23/16   Authorization Type UHC MCR - G code needed   Authorization - Visit Number 2   Authorization - Number of Visits 10   OT Start Time 7948   OT Stop Time 1315   OT Time Calculation (min) 45 min   Activity Tolerance Patient tolerated treatment well      Past Medical History  Diagnosis Date  . Anxiety 2009  . Arthritis 2000  . Fibroid 2003  . Hot flashes, menopausal 2012  . Allergy     seasonal  . RLS (restless legs syndrome)   . Wears glasses   . Depression   . Shortness of breath     with exertion denies    Past Surgical History  Procedure Laterality Date  . Neck surgery  2003    Cervical vertebroplasty   . Colonoscopy    . Open reduction internal fixation (orif) distal radial fracture Right 11/08/2012    Procedure: OPEN REDUCTION INTERNAL FIXATION (ORIF) RIGHT DISTAL RADIUS FRACTURE;  Surgeon: Linna Hoff, MD;  Location: [facility];  Service: Orthopedics;  Laterality: Right;  . Hardware removal Right 07/24/2013    Procedure: RIGHT WRIST HARDWARE REMOVAL, JOINT RELEASE, RIGHT HAND MANIPULATION UNDER ANESTHESIA;  Surgeon: Linna Hoff, MD;  Location: [facility];  Service: Orthopedics;  Laterality: Right;  . Wrist arthroscopy with debridement Right 07/21/2014    Procedure: RIGHT WRIST DISTAL RADIAL ULNAR JOINT DEBRIDEMENT AND JOINT RELEASE POSSIBLE TENDON INTERPOSITION;  Surgeon: Linna Hoff, MD;  Location: [facility];  Service: Orthopedics;  Laterality: Right;  . Arthrotomy Right 11/09/2015   Procedure: RIGHT WRIST DISTAL RADIAL ULNAR JOINT ARTHROTOMY AND DEBRIDEMENT,  AND ;  Surgeon: Iran Planas, MD;  Location: [facility];  Service: Orthopedics;  Laterality: Right;  . Wrist arthroplasty Right 11/09/2015    Procedure: OR DISTAL ULNAR RESECTION ARTHROPLASTY ;  Surgeon: Iran Planas, MD;  Location: [facility];  Service: Orthopedics;  Laterality: Right;    There were no vitals filed for this visit.  Visit Diagnosis:  Stiffness of wrist joint, right  Stiffness of joint, forearm, right  Stiffness of joint, hand, right  Pain and swelling of wrist, right      Subjective Assessment - 11/30/15 1248    Pertinent History 11/09/15: Rt ulna osteoplasty with resection of 1 cm of distal ulnar shaft, Rt wrist stabalization and ECU tendon transfer and tenolysis due to Rt distal radius malunion. 07/21/14: Rt wrist arthoscopy and tendon releases. 11/08/12: ORIF Rt distal radius fx   Patient Stated Goals To get some mobility back in the wrist   Currently in Pain? Yes   Pain Score 9    Pain Location Wrist   Pain Orientation Right   Pain Descriptors / Indicators Aching   Pain Type Chronic pain   Pain Onset More than a month ago   Pain Frequency Constant   Aggravating Factors  cold weather   Pain Relieving Factors pain, meds, elevation  OT Treatments/Exercises (OP) - 11/30/15 0001    Exercises   Exercises Wrist   Wrist Exercises   Forearm Supination AROM;15 reps   Forearm Pronation AROM;15 reps   Other wrist exercises Wrist flexion/extension x 15 reps in A/ROM (RUE)    Modalities   Modalities Paraffin   RUE Paraffin   Number Minutes Paraffin 10 Minutes   RUE Paraffin Location Hand  and wrist as able   Comments at beginning of session to decrease stiffness and pain   Splinting   Splinting Pt issued flexion glove and instructed on how to properply don/doff and wearing time. Pt instructed to begin with 5 minutes 3x/day, then increase to 10 minutes 3x/day as  tolerated.    Manual Therapy   Manual Therapy Passive ROM   Passive ROM Passive finger flexion all fingers Rt hand isolated and compositely                OT Education - 11/30/15 1321    Education provided Yes   Education Details flexion glove wear and care   Person(s) Educated Patient   Methods Explanation;Demonstration   Comprehension Verbalized understanding          OT Short Term Goals - 11/25/15 1240    OT SHORT TERM GOAL #1   Title Indepndent with HEP (due 12/25/15)   Time 4   Period Weeks   Status New   OT SHORT TERM GOAL #2   Title Pt will report pain less than or equal to 4/10 with activity and exercises   Baseline 5/10 at rest   Time 4   Period Weeks   Status New   OT SHORT TERM GOAL #3   Title Pt will consistently be mod I with buttoning and zipping with A/E prn   Time 4   Period Weeks   Status New   OT SHORT TERM GOAL #4   Title Pt will verbalize understanding with potential A/E needs and how to acquire   Time 4   Period Weeks   Status New           OT Long Term Goals - 11/25/15 1242    OT LONG TERM GOAL #1   Title Pt will be I with upgraded HEP prn - due 01/23/16   Time 8   Period Weeks   Status New   OT LONG TERM GOAL #2   Title Pt will improve wrist extension to neutral or greater for functional tasks   Baseline -10*   Time 8   Period Weeks   Status New   OT LONG TERM GOAL #3   Title Pt will demo 50% or greater composite finger flex/ext for gross grasping/releasing   Baseline approx. 40%   Time 8   Period Weeks   Status New   OT LONG TERM GOAL #4   Title Pt to eat 80% or greater with Rt hand    Baseline approx. 50%   Time 8   Period Weeks   Status New               Plan - 11/30/15 1322    Clinical Impression Statement Pt tolerating P/ROM to fingers and responding well to flexion glove.    Plan P/ROM to wrist and forearm if cleared by MD, continue with paraffin, estim for wrist extension   Consulted and Agree with  Plan of Care Patient        Problem List Patient Active Problem List   Diagnosis Date Noted  .  Body aches 11/11/2015  . Insomnia 10/30/2015  . Oral herpes 10/19/2015  . Trichomonas infection 06/08/2015  . Healthcare maintenance 06/08/2015  . Arthritis of spine 03/27/2015  . Fall due to wet surface 03/27/2015  . Dizziness 02/11/2015  . Edema 02/11/2015  . Lumbar spine pain 01/09/2015  . Herpetic lesion 02/07/2014  . Intertriginous candidiasis 02/07/2014  . Depression 07/19/2013  . Distal radius fracture, right 10/20/2012  . Hordeolum 10/03/2012  . Intermittent low back pain 03/30/2012  . Heavy smoker (more than 20 cigarettes per day) 03/30/2012  . Health maintenance examination 03/30/2012  . Hot flashes, menopausal 02/28/2012  . Anxiety 02/28/2012  . Restless leg syndrome 02/28/2012  . Metatarsalgia of both feet 02/28/2012    Carey Bullocks, OTR/L 11/30/2015, 1:23 PM  [facility] [address] [place], [place], [address] Phone: [phone]   Fax:  [phone]  Name: [name] MRN: [id number] Date of Birth: 03/19/1956

## 2015-12-02 ENCOUNTER — Encounter: Payer: Medicare Other | Admitting: Occupational Therapy

## 2015-12-03 ENCOUNTER — Encounter: Payer: Self-pay | Admitting: Occupational Therapy

## 2015-12-03 ENCOUNTER — Ambulatory Visit: Payer: Medicare Other | Admitting: Occupational Therapy

## 2015-12-03 DIAGNOSIS — M6289 Other specified disorders of muscle: Secondary | ICD-10-CM | POA: Diagnosis not present

## 2015-12-03 DIAGNOSIS — M25531 Pain in right wrist: Secondary | ICD-10-CM | POA: Diagnosis not present

## 2015-12-03 DIAGNOSIS — M25631 Stiffness of right wrist, not elsewhere classified: Secondary | ICD-10-CM | POA: Diagnosis not present

## 2015-12-03 DIAGNOSIS — M25431 Effusion, right wrist: Secondary | ICD-10-CM

## 2015-12-03 DIAGNOSIS — M25641 Stiffness of right hand, not elsewhere classified: Secondary | ICD-10-CM | POA: Diagnosis not present

## 2015-12-03 NOTE — Therapy (Signed)
[facility] [address] [place], [place], [address] Phone: [phone]   Fax:  [phone]  Occupational Therapy Treatment  Patient Details  Name: [name] MRN: [id number] Date of Birth: July 17, 1956 Referring Provider: Dr. Iran Planas  Encounter Date: 12/03/2015      OT End of Session - 12/03/15 0841    Visit Number 3   Number of Visits 17   Date for OT Re-Evaluation 01/23/16   Authorization Type UHC MCR - G code needed   Authorization - Visit Number 3   Authorization - Number of Visits 10   OT Start Time 0800   OT Stop Time 0845   OT Time Calculation (min) 45 min   Activity Tolerance Patient tolerated treatment well      Past Medical History  Diagnosis Date  . Anxiety 2009  . Arthritis 2000  . Fibroid 2003  . Hot flashes, menopausal 2012  . Allergy     seasonal  . RLS (restless legs syndrome)   . Wears glasses   . Depression   . Shortness of breath     with exertion denies    Past Surgical History  Procedure Laterality Date  . Neck surgery  2003    Cervical vertebroplasty   . Colonoscopy    . Open reduction internal fixation (orif) distal radial fracture Right 11/08/2012    Procedure: OPEN REDUCTION INTERNAL FIXATION (ORIF) RIGHT DISTAL RADIUS FRACTURE;  Surgeon: Linna Hoff, MD;  Location: [facility];  Service: Orthopedics;  Laterality: Right;  . Hardware removal Right 07/24/2013    Procedure: RIGHT WRIST HARDWARE REMOVAL, JOINT RELEASE, RIGHT HAND MANIPULATION UNDER ANESTHESIA;  Surgeon: Linna Hoff, MD;  Location: [facility];  Service: Orthopedics;  Laterality: Right;  . Wrist arthroscopy with debridement Right 07/21/2014    Procedure: RIGHT WRIST DISTAL RADIAL ULNAR JOINT DEBRIDEMENT AND JOINT RELEASE POSSIBLE TENDON INTERPOSITION;  Surgeon: Linna Hoff, MD;  Location: [facility];  Service: Orthopedics;  Laterality: Right;  . Arthrotomy Right 11/09/2015   Procedure: RIGHT WRIST DISTAL RADIAL ULNAR JOINT ARTHROTOMY AND DEBRIDEMENT,  AND ;  Surgeon: Iran Planas, MD;  Location: [facility];  Service: Orthopedics;  Laterality: Right;  . Wrist arthroplasty Right 11/09/2015    Procedure: OR DISTAL ULNAR RESECTION ARTHROPLASTY ;  Surgeon: Iran Planas, MD;  Location: [facility];  Service: Orthopedics;  Laterality: Right;    There were no vitals filed for this visit.  Visit Diagnosis:  Stiffness of wrist joint, right  Stiffness of joint, forearm, right  Pain and swelling of wrist, right      Subjective Assessment - 12/03/15 0805    Subjective  My pain is much better today. The flexion glove seems to be helping but I'm not up to 10 minutes yet   Pertinent History 11/09/15: Rt ulna osteoplasty with resection of 1 cm of distal ulnar shaft, Rt wrist stabalization and ECU tendon transfer and tenolysis due to Rt distal radius malunion. 07/21/14: Rt wrist arthoscopy and tendon releases. 11/08/12: ORIF Rt distal radius fx   Patient Stated Goals To get some mobility back in the wrist   Currently in Pain? Yes   Pain Score 2    Pain Location Wrist   Pain Orientation Right   Pain Descriptors / Indicators Aching   Pain Type Chronic pain   Pain Onset More than a month ago   Pain Frequency Constant   Aggravating Factors  cold weather   Pain Relieving  Factors pain meds, elevation                      OT Treatments/Exercises (OP) - 12/03/15 0001    ADLs   ADL Comments Therapist sent email to referring MD at last session, but has not heard back for clearance of P/ROM to wrist and forearm.    Wrist Exercises   Other wrist exercises Wrist flexion/extension x 15 reps in A/ROM (RUE)    Other wrist exercises Supination (as able) x 15 reps in AA/ROM    Modalities   Modalities Electrical Stimulation   Electrical Stimulation   Electrical Stimulation Location dorsal forearm  no contraindications   Electrical Stimulation Action wrist extension   Electrical  Stimulation Parameters 50 pps, 250 pw, 10 sec. on/off cycle, Int = 16 x 10 minutes   Electrical Stimulation Goals Neuromuscular facilitation;Tone   RUE Paraffin   Number Minutes Paraffin 10 Minutes   RUE Paraffin Location Hand  and wrist   Comments at beginning of session to decr pain and stiffness   Manual Therapy   Manual Therapy Passive ROM   Passive ROM Passive finger flexion all fingers Rt hand isolated and compositely                  OT Short Term Goals - 11/25/15 1240    OT SHORT TERM GOAL #1   Title Indepndent with HEP (due 12/25/15)   Time 4   Period Weeks   Status New   OT SHORT TERM GOAL #2   Title Pt will report pain less than or equal to 4/10 with activity and exercises   Baseline 5/10 at rest   Time 4   Period Weeks   Status New   OT SHORT TERM GOAL #3   Title Pt will consistently be mod I with buttoning and zipping with A/E prn   Time 4   Period Weeks   Status New   OT SHORT TERM GOAL #4   Title Pt will verbalize understanding with potential A/E needs and how to acquire   Time 4   Period Weeks   Status New           OT Long Term Goals - 11/25/15 1242    OT LONG TERM GOAL #1   Title Pt will be I with upgraded HEP prn - due 01/23/16   Time 8   Period Weeks   Status New   OT LONG TERM GOAL #2   Title Pt will improve wrist extension to neutral or greater for functional tasks   Baseline -10*   Time 8   Period Weeks   Status New   OT LONG TERM GOAL #3   Title Pt will demo 50% or greater composite finger flex/ext for gross grasping/releasing   Baseline approx. 40%   Time 8   Period Weeks   Status New   OT LONG TERM GOAL #4   Title Pt to eat 80% or greater with Rt hand    Baseline approx. 50%   Time 8   Period Weeks   Status New               Plan - 12/03/15 6073    Clinical Impression Statement Pt with overall decreased pain today. Pt limited in ROM (chronic stiffness from multiple surgeries)   Plan continue modalities,  begin gentle P/ROM at wrist/foream (pt will be greater than 4 weeks post-op next week)   Consulted and Agree with  Plan of Care Patient        Problem List Patient Active Problem List   Diagnosis Date Noted  . Body aches 11/11/2015  . Insomnia 10/30/2015  . Oral herpes 10/19/2015  . Trichomonas infection 06/08/2015  . Healthcare maintenance 06/08/2015  . Arthritis of spine 03/27/2015  . Fall due to wet surface 03/27/2015  . Dizziness 02/11/2015  . Edema 02/11/2015  . Lumbar spine pain 01/09/2015  . Herpetic lesion 02/07/2014  . Intertriginous candidiasis 02/07/2014  . Depression 07/19/2013  . Distal radius fracture, right 10/20/2012  . Hordeolum 10/03/2012  . Intermittent low back pain 03/30/2012  . Heavy smoker (more than 20 cigarettes per day) 03/30/2012  . Health maintenance examination 03/30/2012  . Hot flashes, menopausal 02/28/2012  . Anxiety 02/28/2012  . Restless leg syndrome 02/28/2012  . Metatarsalgia of both feet 02/28/2012    Carey Bullocks, OTR/L 12/03/2015, 8:45 AM  [facility] [address] [place], [place], [address] Phone: [phone]   Fax:  [phone]  Name: [name] MRN: [id number] Date of Birth: Jun 01, 1956

## 2015-12-04 ENCOUNTER — Encounter: Payer: Self-pay | Admitting: Student

## 2015-12-04 ENCOUNTER — Ambulatory Visit (INDEPENDENT_AMBULATORY_CARE_PROVIDER_SITE_OTHER): Payer: Medicare Other | Admitting: Student

## 2015-12-04 VITALS — BP 130/80 | HR 82 | Temp 98.1°F | Wt 162.6 lb

## 2015-12-04 DIAGNOSIS — F419 Anxiety disorder, unspecified: Secondary | ICD-10-CM | POA: Diagnosis not present

## 2015-12-04 MED ORDER — SERTRALINE HCL 50 MG PO TABS: 100.0000 mg | ORAL_TABLET | Freq: Every day | ORAL | Status: DC

## 2015-12-04 MED ORDER — CITALOPRAM HYDROBROMIDE 10 MG PO TABS: 10.0000 mg | ORAL_TABLET | Freq: Every day | ORAL | Status: DC

## 2015-12-04 NOTE — Assessment & Plan Note (Addendum)
Anxiety previously treated with Zoloft. But secondary to reported side effects will switch to Celexa. Concerned that as symptoms did not start until she was told that should she could have symptoms like that they're psychosomatic in nature. Behavioral health post doc provider interviewed with the patient during the visit. - Will continue psychotherapy and adjunct to medication therapy - Strong return precautions discussed. - Will follow in 2 weeks to assess Celexa side effects and compliance

## 2015-12-04 NOTE — Progress Notes (Signed)
Dr. Lincoln Brigham requested a [place].   Presenting Issue: Ms. Malak presents with symptoms of anxiety and panic.  Report of symptoms: Patient reported severe "anxiety attacks" and generalized anxiety. During "anxiety attacks", patient has trouble breathing and fears that she will die. Patient was not able to describe more specific symptoms of worry or somatic symptoms at this time.  Duration of CURRENT symptoms: Patient was unable to report specific duration, but stated that symptoms have been present for at least 10 years.  Age of onset of first mood disturbance: Patient did not know.  Impact on function: Not assessed.  Psychiatric History - Diagnoses: Anxiety, depression - Hospitalizations:Not assessed - Pharmacotherapy: Patient recently started on Sertraline, but did not tolerate it well. - Outpatient therapy: None currently, but patient interested in starting therapy. Stated "I do not want to be in therapy, but I need to be in therapy".  Family history of psychiatric issues: Not assessed.  Current and history of substance use: Not assessed.  Medical conditions that might explain or contribute to symptoms: Patient reports that wrist pain resulting from an injury serves as an antecedent to episodes of intense anxiety.  PHQ-9: 6 GAD-7:  6  Assessment / Plan / Recommendations:Patient reported that she has had general "anxiety" for years (unable to be more specific), as well as "anxiety attacks", which she finds very distressing. She tends to experience these episodes when she is in church, at funerals, or when her wrist is in pain. The attacks typically last anywhere from one to several minutes, but patient reported that they "feel like hours". During these attacks, patient reports difficulty breathing and begins to worry that she is going to die. Patient find these attacks very distressing, but denied worrying that she will have an attack or avoiding places where attacks  tend to occur. Patient denied other somatic symptoms during attacks such as muscle tension, sweating, or heart palpitations. Patient also denied other generalized worries during attacks. Patient symptoms seem consistent with panic attacks, although she does not meet criteria for "panic disorder". [facility] provided psychoeducation about panic attacks and taught patient diaphragmatic breathing. Patient successfully demonstrated diaphragmatic breathing to [facility], and will practice five minutes every day. Patient will follow up with [facility] in one week for further assessment and treatment of anxiety.

## 2015-12-04 NOTE — Progress Notes (Signed)
   Subjective:    Patient ID: [name], female    DOB: 13-Mar-1956, 60 y.o.   MRN: [id number]   CC: follow up anxiety  HPI: 60 y/o F presenting for follow up anxiety after wrist surgery.   Anxiety - Previously treated with Zoloft. - Patient reports that after her follow-up appointment she develops diarrhea which she attributes to the Zoloft. - She reports she developed diarrhea after being told during the visit that she could have it as a side effect from Zoloft. - She has since started cutting the Zoloft in half so only taking 25 mg daily - In setting of her recent surgery and physical therapy she feels that her anxiety has been poorly controlled - She denies SI / HI - Her wrist pain is controlled with Norco prescribed by her surgeon   Review of Systems ROS  Per the history of present illness otherwise she denies recent illnesses, fevers, chest pain, shortness of breath  Past Medical, Surgical, Social, and Family History Reviewed & Updated per EMR.   Objective:  BP 130/80 mmHg  Pulse 82  Temp(Src) 98.1 F (36.7 C) (Oral)  Wt 162 lb 9.6 oz (73.755 kg) Vitals and nursing note reviewed  General: NAD Cardiac: RRR, Respiratory: CTAB, normal effort Extremities:  right wrist in cast, else no edema or cyanosis. WWP. Skin: warm and dry, no rashes noted Neuro: alert and oriented, no focal deficits   Assessment & Plan:    Anxiety Anxiety previously treated with Zoloft. But secondary to reported side effects will switch to Celexa. Concerned that as symptoms did not start until she was told that should she could have symptoms like that they're psychosomatic in nature. Behavioral health post doc provider interviewed with the patient during the visit. - Will continue psychotherapy and adjunct to medication therapy - Strong return precautions discussed. - Will follow in 2 weeks to assess Celexa side effects and compliance      Onix Jumper A. Lincoln Brigham MD, [facility] Resident  PGY-2 Pager [phone]

## 2015-12-04 NOTE — Patient Instructions (Addendum)
Please follow-up in 1 weeks for anxiety with [place] will be prescribed Celexa for anxiety. If you have any questions or concerns please call the office at [phone]

## 2015-12-07 ENCOUNTER — Ambulatory Visit: Payer: Medicare Other | Admitting: Occupational Therapy

## 2015-12-07 DIAGNOSIS — M25641 Stiffness of right hand, not elsewhere classified: Secondary | ICD-10-CM | POA: Diagnosis not present

## 2015-12-07 DIAGNOSIS — M25431 Effusion, right wrist: Secondary | ICD-10-CM | POA: Diagnosis not present

## 2015-12-07 DIAGNOSIS — M25531 Pain in right wrist: Secondary | ICD-10-CM | POA: Diagnosis not present

## 2015-12-07 DIAGNOSIS — M6289 Other specified disorders of muscle: Secondary | ICD-10-CM | POA: Diagnosis not present

## 2015-12-07 DIAGNOSIS — M25631 Stiffness of right wrist, not elsewhere classified: Secondary | ICD-10-CM

## 2015-12-07 NOTE — Therapy (Signed)
[facility] [address] [place], [place], [address] Phone: [phone]   Fax:  [phone]  Occupational Therapy Treatment  Patient Details  Name: [name] MRN: [id number] Date of Birth: 12/20/1955 Referring Provider: Dr. Iran Planas  Encounter Date: 12/07/2015      OT End of Session - 12/07/15 1403    Visit Number 4   Number of Visits 17   Date for OT Re-Evaluation 01/23/16   Authorization Type UHC MCR - G code needed   Authorization - Visit Number 4   Authorization - Number of Visits 10   OT Start Time 1150   OT Stop Time 1231   OT Time Calculation (min) 41 min   Activity Tolerance Patient tolerated treatment well      Past Medical History  Diagnosis Date  . Anxiety 2009  . Arthritis 2000  . Fibroid 2003  . Hot flashes, menopausal 2012  . Allergy     seasonal  . RLS (restless legs syndrome)   . Wears glasses   . Depression   . Shortness of breath     with exertion denies    Past Surgical History  Procedure Laterality Date  . Neck surgery  2003    Cervical vertebroplasty   . Colonoscopy    . Open reduction internal fixation (orif) distal radial fracture Right 11/08/2012    Procedure: OPEN REDUCTION INTERNAL FIXATION (ORIF) RIGHT DISTAL RADIUS FRACTURE;  Surgeon: Linna Hoff, MD;  Location: [place];  Service: Orthopedics;  Laterality: Right;  . Hardware removal Right 07/24/2013    Procedure: RIGHT WRIST HARDWARE REMOVAL, JOINT RELEASE, RIGHT HAND MANIPULATION UNDER ANESTHESIA;  Surgeon: Linna Hoff, MD;  Location: [facility];  Service: Orthopedics;  Laterality: Right;  . Wrist arthroscopy with debridement Right 07/21/2014    Procedure: RIGHT WRIST DISTAL RADIAL ULNAR JOINT DEBRIDEMENT AND JOINT RELEASE POSSIBLE TENDON INTERPOSITION;  Surgeon: Linna Hoff, MD;  Location: [facility];  Service: Orthopedics;  Laterality: Right;  . Arthrotomy Right 11/09/2015   Procedure: RIGHT WRIST DISTAL RADIAL ULNAR JOINT ARTHROTOMY AND DEBRIDEMENT,  AND ;  Surgeon: Iran Planas, MD;  Location: [facility];  Service: Orthopedics;  Laterality: Right;  . Wrist arthroplasty Right 11/09/2015    Procedure: OR DISTAL ULNAR RESECTION ARTHROPLASTY ;  Surgeon: Iran Planas, MD;  Location: [facility];  Service: Orthopedics;  Laterality: Right;    There were no vitals filed for this visit.  Visit Diagnosis:  Stiffness of wrist joint, right  Stiffness of joint, forearm, right  Stiffness of joint, hand, right  Pain and swelling of wrist, right      Subjective Assessment - 12/07/15 1156    Subjective  I am not feeling good about this today   Pertinent History 11/09/15: Rt ulna osteoplasty with resection of 1 cm of distal ulnar shaft, Rt wrist stabalization and ECU tendon transfer and tenolysis due to Rt distal radius malunion. 07/21/14: Rt wrist arthoscopy and tendon releases. 11/08/12: ORIF Rt distal radius fx   Patient Stated Goals To get some mobility back in the wrist   Pain Score 8    Pain Location Wrist   Pain Orientation Right   Pain Descriptors / Indicators Aching   Pain Type Chronic pain   Pain Onset More than a month ago   Pain Frequency Constant   Aggravating Factors  work - cleaning this weekend   Pain Relieving Factors pain meds  OT Treatments/Exercises (OP) - 12/07/15 0001    Wrist Exercises   Other wrist exercises Wrist flexion and extension AROM x 15 reps.  Gentle passive motion to increase wrist range,  Patient with very slight radial, ulnar deviation AAROM   Other wrist exercises Supination active and gentle passive following myofascial release between radius and ulna.     RUE Paraffin   Number Minutes Paraffin 10 Minutes   RUE Paraffin Location Hand  and wrist   Comments at beginning of session to decrease pain and stiffness   Manual Therapy   Myofascial Release Between radius and ulna to increase supination beyond  neutral   Passive ROM Passive finger flexion right hand, composite and isolated digits.  Patient reports frustration at fingers getting looser here in therapy and then stiffening right back up.  Explained that patient needs to frequently move digits during the day to keep motion in joints and prevent stiffness.                  OT Education - 12/07/15 1359    Education provided Yes   Education Details reviewed HEP. Patient needs additional reinforcement   Person(s) Educated Patient   Methods Explanation;Demonstration   Comprehension Need further instruction;Verbal cues required          OT Short Term Goals - 11/25/15 1240    OT SHORT TERM GOAL #1   Title Indepndent with HEP (due 12/25/15)   Time 4   Period Weeks   Status New   OT SHORT TERM GOAL #2   Title Pt will report pain less than or equal to 4/10 with activity and exercises   Baseline 5/10 at rest   Time 4   Period Weeks   Status New   OT SHORT TERM GOAL #3   Title Pt will consistently be mod I with buttoning and zipping with A/E prn   Time 4   Period Weeks   Status New   OT SHORT TERM GOAL #4   Title Pt will verbalize understanding with potential A/E needs and how to acquire   Time 4   Period Weeks   Status New           OT Long Term Goals - 11/25/15 1242    OT LONG TERM GOAL #1   Title Pt will be I with upgraded HEP prn - due 01/23/16   Time 8   Period Weeks   Status New   OT LONG TERM GOAL #2   Title Pt will improve wrist extension to neutral or greater for functional tasks   Baseline -10*   Time 8   Period Weeks   Status New   OT LONG TERM GOAL #3   Title Pt will demo 50% or greater composite finger flex/ext for gross grasping/releasing   Baseline approx. 40%   Time 8   Period Weeks   Status New   OT LONG TERM GOAL #4   Title Pt to eat 80% or greater with Rt hand    Baseline approx. 50%   Time 8   Period Weeks   Status New               Plan - 12/07/15 1404    Clinical  Impression Statement Patient with report of significant pain today, patient tearful - stating - it always hurts.     Pt will benefit from skilled therapeutic intervention in order to improve on the following deficits (Retired) Decreased coordination;Decreased range of motion;Impaired  flexibility;Increased edema;Impaired sensation;Decreased activity tolerance;Decreased knowledge of precautions;Decreased scar mobility;Impaired UE functional use;Pain;Decreased mobility;Decreased strength   OT Frequency 2x / week   OT Duration 8 weeks   OT Treatment/Interventions Self-care/ADL training;Therapeutic exercise;Patient/family education;Ultrasound;Neuromuscular education;Manual Therapy;Splinting;Cryotherapy;Parrafin;DME and/or AE instruction;Compression bandaging;Therapeutic activities;Electrical Stimulation;Fluidtherapy;Scar mobilization;Moist Heat;Contrast Bath;Passive range of motion   Plan continue modalities, begin gentle P/ROM wrist   Consulted and Agree with Plan of Care Patient        Problem List Patient Active Problem List   Diagnosis Date Noted  . Body aches 11/11/2015  . Insomnia 10/30/2015  . Oral herpes 10/19/2015  . Trichomonas infection 06/08/2015  . Healthcare maintenance 06/08/2015  . Arthritis of spine 03/27/2015  . Fall due to wet surface 03/27/2015  . Dizziness 02/11/2015  . Edema 02/11/2015  . Lumbar spine pain 01/09/2015  . Herpetic lesion 02/07/2014  . Intertriginous candidiasis 02/07/2014  . Depression 07/19/2013  . Distal radius fracture, right 10/20/2012  . Hordeolum 10/03/2012  . Intermittent low back pain 03/30/2012  . Heavy smoker (more than 20 cigarettes per day) 03/30/2012  . Health maintenance examination 03/30/2012  . Hot flashes, menopausal 02/28/2012  . Anxiety 02/28/2012  . Restless leg syndrome 02/28/2012  . Metatarsalgia of both feet 02/28/2012    Mariah Milling, OTR/L 12/07/2015, 2:06 PM  [facility] [address] [place], [place], [address] Phone: [phone]   Fax:  [phone]  Name: [name] MRN: [id number] Date of Birth: 1956-05-05

## 2015-12-09 ENCOUNTER — Encounter: Payer: Self-pay | Admitting: Occupational Therapy

## 2015-12-09 ENCOUNTER — Ambulatory Visit: Payer: Medicare Other | Admitting: Occupational Therapy

## 2015-12-09 DIAGNOSIS — M25641 Stiffness of right hand, not elsewhere classified: Secondary | ICD-10-CM

## 2015-12-09 DIAGNOSIS — M25431 Effusion, right wrist: Secondary | ICD-10-CM | POA: Diagnosis not present

## 2015-12-09 DIAGNOSIS — M25531 Pain in right wrist: Secondary | ICD-10-CM | POA: Diagnosis not present

## 2015-12-09 DIAGNOSIS — M25631 Stiffness of right wrist, not elsewhere classified: Secondary | ICD-10-CM

## 2015-12-09 DIAGNOSIS — M6289 Other specified disorders of muscle: Secondary | ICD-10-CM | POA: Diagnosis not present

## 2015-12-09 NOTE — Therapy (Signed)
[facility] [address] [place], [place], [address] Phone: [phone]   Fax:  [phone]  Occupational Therapy Treatment  Patient Details  Name: [name] MRN: [id number] Date of Birth: September 14, 1955 Referring Provider: Dr. Iran Planas  Encounter Date: 12/09/2015      OT End of Session - 12/09/15 1409    Visit Number 5   Number of Visits 17   Date for OT Re-Evaluation 01/23/16   Authorization Type UHC MCR - G code needed   Authorization - Visit Number 5   Authorization - Number of Visits 10   OT Start Time 1150   OT Stop Time 1231   OT Time Calculation (min) 41 min   Activity Tolerance Patient tolerated treatment well   Behavior During Therapy [facility] for tasks assessed/performed      Past Medical History  Diagnosis Date  . Anxiety 2009  . Arthritis 2000  . Fibroid 2003  . Hot flashes, menopausal 2012  . Allergy     seasonal  . RLS (restless legs syndrome)   . Wears glasses   . Depression   . Shortness of breath     with exertion denies    Past Surgical History  Procedure Laterality Date  . Neck surgery  2003    Cervical vertebroplasty   . Colonoscopy    . Open reduction internal fixation (orif) distal radial fracture Right 11/08/2012    Procedure: OPEN REDUCTION INTERNAL FIXATION (ORIF) RIGHT DISTAL RADIUS FRACTURE;  Surgeon: Linna Hoff, MD;  Location: [facility];  Service: Orthopedics;  Laterality: Right;  . Hardware removal Right 07/24/2013    Procedure: RIGHT WRIST HARDWARE REMOVAL, JOINT RELEASE, RIGHT HAND MANIPULATION UNDER ANESTHESIA;  Surgeon: Linna Hoff, MD;  Location: [facility];  Service: Orthopedics;  Laterality: Right;  . Wrist arthroscopy with debridement Right 07/21/2014    Procedure: RIGHT WRIST DISTAL RADIAL ULNAR JOINT DEBRIDEMENT AND JOINT RELEASE POSSIBLE TENDON INTERPOSITION;  Surgeon: Linna Hoff, MD;  Location: [facility];  Service:  Orthopedics;  Laterality: Right;  . Arthrotomy Right 11/09/2015    Procedure: RIGHT WRIST DISTAL RADIAL ULNAR JOINT ARTHROTOMY AND DEBRIDEMENT,  AND ;  Surgeon: Iran Planas, MD;  Location: [facility];  Service: Orthopedics;  Laterality: Right;  . Wrist arthroplasty Right 11/09/2015    Procedure: OR DISTAL ULNAR RESECTION ARTHROPLASTY ;  Surgeon: Iran Planas, MD;  Location: [facility];  Service: Orthopedics;  Laterality: Right;    There were no vitals filed for this visit.  Visit Diagnosis:  Stiffness of wrist joint, right  Stiffness of joint, forearm, right  Stiffness of joint, hand, right  Pain and swelling of wrist, right      Subjective Assessment - 12/09/15 1159    Subjective  (p) It was pretty sore last night   Pertinent History (p) 11/09/15: Rt ulna osteoplasty with resection of 1 cm of distal ulnar shaft, Rt wrist stabalization and ECU tendon transfer and tenolysis due to Rt distal radius malunion. 07/21/14: Rt wrist arthoscopy and tendon releases. 11/08/12: ORIF Rt distal radius fx   Patient Stated Goals (p) To get some mobility back in the wrist   Currently in Pain? (p) Yes   Pain Score (p) 0-No pain  stiffness   Pain Location (p) Wrist   Pain Orientation (p) Right   Pain Descriptors / Indicators (p) Aching  OT Treatments/Exercises (OP) - 12/09/15 0001    ADLs   ADL Comments Patient indicates that she is able to start to use her right hand for cooking - stirring thin items.  She reports fatigue and stiffness as limiting factors.     Exercises   Exercises Wrist   Wrist Exercises   Forearm Supination PROM;AROM;Right;Seated   Wrist Flexion PROM;AROM;Right;Seated   Wrist Extension PROM;AROM;Right;Seated   Wrist Radial Deviation PROM;AROM;Right;Seated   Wrist Ulnar Deviation PROM;AROM;Right;Seated   Additional Wrist Exercises   Theraputty - Grip Working to increase MCP, PIP JOINT FLEXION  with medium soft putty.     Theraputty - Pinch Medium soft  with emphasis on tip pinch.     Hand Exercises   MCPJ Flexion PROM;AROM;Right  Prolonged stretch following joint mobilization   PIPJ Flexion PROM;AROM;Right  prolonged stretch after joint mobilization   RUE Paraffin   Number Minutes Paraffin 10 Minutes   RUE Paraffin Location Hand   Manual Therapy   Joint Mobilization MCP, PIP joints right hand                OT Education - 12/09/15 1234    Education provided Yes   Education Details theraputty exercises   Person(s) Educated Patient   Methods Explanation;Demonstration   Comprehension Verbalized understanding;Returned demonstration          OT Short Term Goals - 11/25/15 1240    OT SHORT TERM GOAL #1   Title Indepndent with HEP (due 12/25/15)   Time 4   Period Weeks   Status New   OT SHORT TERM GOAL #2   Title Pt will report pain less than or equal to 4/10 with activity and exercises   Baseline 5/10 at rest   Time 4   Period Weeks   Status New   OT SHORT TERM GOAL #3   Title Pt will consistently be mod I with buttoning and zipping with A/E prn   Time 4   Period Weeks   Status New   OT SHORT TERM GOAL #4   Title Pt will verbalize understanding with potential A/E needs and how to acquire   Time 4   Period Weeks   Status New           OT Long Term Goals - 11/25/15 1242    OT LONG TERM GOAL #1   Title Pt will be I with upgraded HEP prn - due 01/23/16   Time 8   Period Weeks   Status New   OT LONG TERM GOAL #2   Title Pt will improve wrist extension to neutral or greater for functional tasks   Baseline -10*   Time 8   Period Weeks   Status New   OT LONG TERM GOAL #3   Title Pt will demo 50% or greater composite finger flex/ext for gross grasping/releasing   Baseline approx. 40%   Time 8   Period Weeks   Status New   OT LONG TERM GOAL #4   Title Pt to eat 80% or greater with Rt hand    Baseline approx. 50%   Time 8   Period Weeks   Status New               Plan - 12/09/15 1410     Clinical Impression Statement Patient reports stiffness versus pain this session.  Patient reports stiffness as major limiting factor.   Pt will benefit from skilled therapeutic intervention in order to improve on the following  deficits (Retired) Decreased coordination;Decreased range of motion;Impaired flexibility;Increased edema;Impaired sensation;Decreased activity tolerance;Decreased knowledge of precautions;Decreased scar mobility;Impaired UE functional use;Pain;Decreased mobility;Decreased strength   Rehab Potential Fair   OT Frequency 2x / week   OT Duration 8 weeks   OT Treatment/Interventions Self-care/ADL training;Therapeutic exercise;Patient/family education;Ultrasound;Neuromuscular education;Manual Therapy;Splinting;Cryotherapy;Parrafin;DME and/or AE instruction;Compression bandaging;Therapeutic activities;Electrical Stimulation;Fluidtherapy;Scar mobilization;Moist Heat;Contrast Bath;Passive range of motion   Plan continue modalities - fluido when available   OT Home Exercise Plan theraputty - red   Consulted and Agree with Plan of Care Patient        Problem List Patient Active Problem List   Diagnosis Date Noted  . Body aches 11/11/2015  . Insomnia 10/30/2015  . Oral herpes 10/19/2015  . Trichomonas infection 06/08/2015  . Healthcare maintenance 06/08/2015  . Arthritis of spine 03/27/2015  . Fall due to wet surface 03/27/2015  . Dizziness 02/11/2015  . Edema 02/11/2015  . Lumbar spine pain 01/09/2015  . Herpetic lesion 02/07/2014  . Intertriginous candidiasis 02/07/2014  . Depression 07/19/2013  . Distal radius fracture, right 10/20/2012  . Hordeolum 10/03/2012  . Intermittent low back pain 03/30/2012  . Heavy smoker (more than 20 cigarettes per day) 03/30/2012  . Health maintenance examination 03/30/2012  . Hot flashes, menopausal 02/28/2012  . Anxiety 02/28/2012  . Restless leg syndrome 02/28/2012  . Metatarsalgia of both feet 02/28/2012    Mariah Milling,  OTR/L 12/09/2015, 2:15 PM  [facility] [address] [place], [place], [address] Phone: [phone]   Fax:  [phone]  Name: [name] MRN: [id number] Date of Birth: Sep 17, 1955

## 2015-12-09 NOTE — Patient Instructions (Signed)
1. Grip Strengthening (Resistive Putty)   Squeeze putty using thumb and all fingers. Repeat 15 times. Do 1 sessions per day.     Palmar Pinch Strengthening (Resistive Putty)   Pinch putty between thumb and each fingertip in turn after rolling out      IP Fisting (Resistive Putty)   Keeping knuckles straight, bend fingertips to squeeze putty. Repeat 10 times. Do 1 sessions per day.  MP Flexion (Resistive Putty)   Bending only at large knuckles, press putty down against thumb. Keep fingertips straight. Repeat 10 times. Do 2 sessions per day.

## 2015-12-11 ENCOUNTER — Ambulatory Visit (INDEPENDENT_AMBULATORY_CARE_PROVIDER_SITE_OTHER): Payer: Medicare Other | Admitting: Psychology

## 2015-12-11 DIAGNOSIS — F419 Anxiety disorder, unspecified: Secondary | ICD-10-CM

## 2015-12-11 NOTE — Assessment & Plan Note (Addendum)
Patient reported that she has panic attacks when she is experiencing wrist pain, when she is at funerals, and when she is in church. Patient also complained of crying spells at inappropriate times (e.g., began crying at the beginning of appointment despite reporting that she did not feel sad or distressed). Patient denied generalized worry or somatic symptoms of anxiety outside of panic attacks, and denied depressed mood. However, patient endorsed difficulty controlling worry and relaxing over half of the days on GAD-7. Patient attempted to complete PHQ-9, but marked out multiple answers on each item and was unable to specify which answers she intended keep. Generally, patient exhibited difficulty with reporting history of symptoms.  Patient reported that during panic attacks, she has difficulty breathing and as a result, worries that she is going to die. She was unable to report a frequency, and only reported that attacks occur less than once per week. However, she finds these attacks very distressing and denies other psychological symptoms. [facility] provided psychoeducation about panic attacks and discussed CBT triangle of anxiety. [facility] and patient reviewed diaphragmatic breathing, and patient stated that she practiced "once or twice" in the past week. [facility] and patient also discussed mindfulness and patient's thoughts during panic attacks. [facility] reassured patient that people generally do not die during panic attacks, and discussed using coping thoughts (e.g, "this is unpleasant, but it will be over soon"). Patient will follow-up with [facility] in two weeks to continue discussing coping strategies.

## 2015-12-11 NOTE — Patient Instructions (Signed)
It was great to see you today! Remember to do your breathing exercises:  Diaphragmatic Breathing ("belly breathing")  - Find a relaxing place (like a bed or couch) - it is best to lay down. - One hand on stomach, one on chest - stomach should rise and fall, chest should stay still - Breathe in 8 seconds, out 5 seconds - Practice 5 minutes daily  You are scheduled for a follow up appointment in two weeks on Friday, April 14 at 1:30 PM.

## 2015-12-11 NOTE — Progress Notes (Signed)
Reason for follow-up: Patient followed up to treat symptoms of anxiety  Issues discussed: Discussed nature and frequency of patient's panic attacks; provided psychoeducation about panic attacks, and discussed coping strategies.  Identified goals: Patient would like to reduce symptoms of anxiety and panic.

## 2015-12-14 ENCOUNTER — Encounter: Payer: Medicare Other | Admitting: Occupational Therapy

## 2015-12-14 DIAGNOSIS — S52561D Barton's fracture of right radius, subsequent encounter for closed fracture with routine healing: Secondary | ICD-10-CM | POA: Diagnosis not present

## 2015-12-15 ENCOUNTER — Ambulatory Visit: Payer: Medicare Other | Attending: Orthopedic Surgery | Admitting: Occupational Therapy

## 2015-12-15 ENCOUNTER — Encounter: Payer: Self-pay | Admitting: Occupational Therapy

## 2015-12-15 DIAGNOSIS — M25631 Stiffness of right wrist, not elsewhere classified: Secondary | ICD-10-CM | POA: Diagnosis not present

## 2015-12-15 DIAGNOSIS — M79641 Pain in right hand: Secondary | ICD-10-CM | POA: Diagnosis not present

## 2015-12-15 DIAGNOSIS — M6281 Muscle weakness (generalized): Secondary | ICD-10-CM | POA: Insufficient documentation

## 2015-12-15 DIAGNOSIS — G8929 Other chronic pain: Secondary | ICD-10-CM | POA: Diagnosis not present

## 2015-12-15 DIAGNOSIS — M25621 Stiffness of right elbow, not elsewhere classified: Secondary | ICD-10-CM | POA: Insufficient documentation

## 2015-12-15 DIAGNOSIS — M25531 Pain in right wrist: Secondary | ICD-10-CM | POA: Insufficient documentation

## 2015-12-15 NOTE — Therapy (Signed)
[facility] [address] [place], [place], [address] Phone: [phone]   Fax:  [phone]  Occupational Therapy Treatment  Patient Details  Name: [name] MRN: [id number] Date of Birth: 1955/09/13 Referring Provider: Dr. Iran Planas  Encounter Date: 12/15/2015      OT End of Session - 12/15/15 1046    Visit Number 6   Number of Visits 17   Date for OT Re-Evaluation 01/23/16   Authorization Type UHC MCR - G code needed   Authorization - Visit Number 6   Authorization - Number of Visits 10   OT Start Time 0935   OT Stop Time 1020   OT Time Calculation (min) 45 min   Activity Tolerance Patient tolerated treatment well      Past Medical History  Diagnosis Date  . Anxiety 2009  . Arthritis 2000  . Fibroid 2003  . Hot flashes, menopausal 2012  . Allergy     seasonal  . RLS (restless legs syndrome)   . Wears glasses   . Depression   . Shortness of breath     with exertion denies    Past Surgical History  Procedure Laterality Date  . Neck surgery  2003    Cervical vertebroplasty   . Colonoscopy    . Open reduction internal fixation (orif) distal radial fracture Right 11/08/2012    Procedure: OPEN REDUCTION INTERNAL FIXATION (ORIF) RIGHT DISTAL RADIUS FRACTURE;  Surgeon: Linna Hoff, MD;  Location: [facility];  Service: Orthopedics;  Laterality: Right;  . Hardware removal Right 07/24/2013    Procedure: RIGHT WRIST HARDWARE REMOVAL, JOINT RELEASE, RIGHT HAND MANIPULATION UNDER ANESTHESIA;  Surgeon: Linna Hoff, MD;  Location: [facility];  Service: Orthopedics;  Laterality: Right;  . Wrist arthroscopy with debridement Right 07/21/2014    Procedure: RIGHT WRIST DISTAL RADIAL ULNAR JOINT DEBRIDEMENT AND JOINT RELEASE POSSIBLE TENDON INTERPOSITION;  Surgeon: Linna Hoff, MD;  Location: [facility];  Service: Orthopedics;  Laterality: Right;  . Arthrotomy Right 11/09/2015   Procedure: RIGHT WRIST DISTAL RADIAL ULNAR JOINT ARTHROTOMY AND DEBRIDEMENT,  AND ;  Surgeon: Iran Planas, MD;  Location: [facility];  Service: Orthopedics;  Laterality: Right;  . Wrist arthroplasty Right 11/09/2015    Procedure: OR DISTAL ULNAR RESECTION ARTHROPLASTY ;  Surgeon: Iran Planas, MD;  Location: [facility];  Service: Orthopedics;  Laterality: Right;    There were no vitals filed for this visit.  Visit Diagnosis:  Stiffness of right wrist, not elsewhere classified  Stiffness of right elbow, not elsewhere classified  Chronic pain of right wrist  Muscle weakness (generalized)      Subjective Assessment - 12/15/15 0947    Pertinent History 11/09/15: Rt ulna osteoplasty with resection of 1 cm of distal ulnar shaft, Rt wrist stabalization and ECU tendon transfer and tenolysis due to Rt distal radius malunion. 07/21/14: Rt wrist arthoscopy and tendon releases. 11/08/12: ORIF Rt distal radius fx   Limitations ok for A/ROM and P/ROM at wrist, ok for strengthening to hand - NO strengthening to wrist until after next MD appt 01/12/16   Patient Stated Goals To get some mobility back in the wrist   Currently in Pain? Yes   Pain Score 8    Pain Location Wrist   Pain Orientation Right   Pain Descriptors / Indicators Aching   Pain Type Chronic pain   Pain Onset More than a month ago   Pain Frequency Intermittent   Aggravating  Factors  rain, cold, P/ROM   Pain Relieving Factors rest, heat, pain meds                      OT Treatments/Exercises (OP) - 12/15/15 0001    ADLs   ADL Comments Called MD office to clarify remaining precautions secondary to not hearing back from email sent on 11/30/15. MD (via nurse) reported ok for A/ROM, P/ROM to wrist ok but no strengthening to wrist/forearm until next MD appt. Ok for hand strengthening    Wrist Exercises   Other wrist exercises A/ROM in wrist flex/ext and supination as able x 15 reps each   Other wrist exercises P/ROM in wrist flex/ext  and supination    Hand Exercises   PIPJ Flexion PROM;AROM;Right  each finger Rt hand   RUE Paraffin   Number Minutes Paraffin 10 Minutes   RUE Paraffin Location Hand  and wrist   Comments at beginning of session to decrease stiffness/pain   Splinting   Splinting Adjusted finger flexion glove to increase stretch. Reviewed wear and care                OT Education - 12/15/15 1018    Education provided Yes   Education Details P/ROM HEP for wrist extension, supination, and PIP flexion all digits   Person(s) Educated Patient   Methods Explanation;Demonstration;Handout   Comprehension Verbalized understanding;Returned demonstration          OT Short Term Goals - 12/15/15 1047    OT SHORT TERM GOAL #1   Title Indepndent with HEP (due 12/25/15)   Time 4   Period Weeks   Status Achieved   OT SHORT TERM GOAL #2   Title Pt will report pain less than or equal to 4/10 with activity and exercises   Baseline 5/10 at rest   Time 4   Period Weeks   Status On-going   OT SHORT TERM GOAL #3   Title Pt will consistently be mod I with buttoning and zipping with A/E prn   Time 4   Period Weeks   Status On-going   OT SHORT TERM GOAL #4   Title Pt will verbalize understanding with potential A/E needs and how to acquire   Time 4   Period Weeks   Status On-going           OT Long Term Goals - 11/25/15 1242    OT LONG TERM GOAL #1   Title Pt will be I with upgraded HEP prn - due 01/23/16   Time 8   Period Weeks   Status New   OT LONG TERM GOAL #2   Title Pt will improve wrist extension to neutral or greater for functional tasks   Baseline -10*   Time 8   Period Weeks   Status New   OT LONG TERM GOAL #3   Title Pt will demo 50% or greater composite finger flex/ext for gross grasping/releasing   Baseline approx. 40%   Time 8   Period Weeks   Status New   OT LONG TERM GOAL #4   Title Pt to eat 80% or greater with Rt hand    Baseline approx. 50%   Time 8   Period  Weeks   Status New               Plan - 12/15/15 1047    Clinical Impression Statement Pt met STG #1 and approximating remaining STG's. Stiffness of joints remains limiting  factor   Plan continue modalities and A/ROM, P/ROM   Consulted and Agree with Plan of Care Patient        Problem List Patient Active Problem List   Diagnosis Date Noted  . Body aches 11/11/2015  . Insomnia 10/30/2015  . Oral herpes 10/19/2015  . Trichomonas infection 06/08/2015  . Healthcare maintenance 06/08/2015  . Arthritis of spine 03/27/2015  . Fall due to wet surface 03/27/2015  . Dizziness 02/11/2015  . Edema 02/11/2015  . Lumbar spine pain 01/09/2015  . Herpetic lesion 02/07/2014  . Intertriginous candidiasis 02/07/2014  . Depression 07/19/2013  . Distal radius fracture, right 10/20/2012  . Hordeolum 10/03/2012  . Intermittent low back pain 03/30/2012  . Heavy smoker (more than 20 cigarettes per day) 03/30/2012  . Health maintenance examination 03/30/2012  . Hot flashes, menopausal 02/28/2012  . Anxiety 02/28/2012  . Restless leg syndrome 02/28/2012  . Metatarsalgia of both feet 02/28/2012    Carey Bullocks, OTR/L 12/15/2015, 10:49 AM  [facility] [address] [place], [place], [address] Phone: [phone]   Fax:  [phone]  Name: ZAMZAM WHINERY MRN: [id number] Date of Birth: 1956/07/28

## 2015-12-15 NOTE — Patient Instructions (Signed)
PROM: Wrist Flexion / Extension   Grasp  hand and slowly bend wrist until stretch is felt. Relax. Then stretch as far as possible in opposite direction. Be sure to keep elbow bent.  Hold __10__ sec. each way Repeat _5___ times per set.    Do _4-6___ sessions per day. Emphasis should be on up motion     Supination (Passive)   Keep elbow bent at right angle and held firmly at side. Use other hand to turn forearm until palm faces upward. Hold __10__ seconds. Repeat __5__ times. Do _4-6___ sessions per day.    PIP Flexion (Passive)   Use other hand to bend the middle joint of ______ finger down as far as possible. Hold _10___ seconds. Repeat __5__ times. Do _4-6___ sessions per day.

## 2015-12-16 ENCOUNTER — Encounter: Payer: Self-pay | Admitting: Occupational Therapy

## 2015-12-16 ENCOUNTER — Ambulatory Visit: Payer: Medicare Other | Admitting: Occupational Therapy

## 2015-12-16 DIAGNOSIS — M79641 Pain in right hand: Secondary | ICD-10-CM | POA: Diagnosis not present

## 2015-12-16 DIAGNOSIS — M25631 Stiffness of right wrist, not elsewhere classified: Secondary | ICD-10-CM

## 2015-12-16 DIAGNOSIS — M25621 Stiffness of right elbow, not elsewhere classified: Secondary | ICD-10-CM

## 2015-12-16 DIAGNOSIS — M25531 Pain in right wrist: Secondary | ICD-10-CM | POA: Diagnosis not present

## 2015-12-16 DIAGNOSIS — G8929 Other chronic pain: Secondary | ICD-10-CM | POA: Diagnosis not present

## 2015-12-16 DIAGNOSIS — M6281 Muscle weakness (generalized): Secondary | ICD-10-CM

## 2015-12-16 NOTE — Therapy (Signed)
[facility] [address] [place], [place], [address] Phone: [phone]   Fax:  [phone]  Occupational Therapy Treatment  Patient Details  Name: [name] MRN: [id number] Date of Birth: 1955/12/23 Referring Provider: Dr. Iran Planas  Encounter Date: 12/16/2015      OT End of Session - 12/16/15 1216    Visit Number 7   Number of Visits 17   Date for OT Re-Evaluation 01/23/16   Authorization Type UHC MCR - G code needed   Authorization - Visit Number 7   Authorization - Number of Visits 10   OT Start Time 1020   OT Stop Time 1100   OT Time Calculation (min) 40 min   Activity Tolerance Patient tolerated treatment well      Past Medical History  Diagnosis Date  . Anxiety 2009  . Arthritis 2000  . Fibroid 2003  . Hot flashes, menopausal 2012  . Allergy     seasonal  . RLS (restless legs syndrome)   . Wears glasses   . Depression   . Shortness of breath     with exertion denies    Past Surgical History  Procedure Laterality Date  . Neck surgery  2003    Cervical vertebroplasty   . Colonoscopy    . Open reduction internal fixation (orif) distal radial fracture Right 11/08/2012    Procedure: OPEN REDUCTION INTERNAL FIXATION (ORIF) RIGHT DISTAL RADIUS FRACTURE;  Surgeon: Linna Hoff, MD;  Location: [facility];  Service: Orthopedics;  Laterality: Right;  . Hardware removal Right 07/24/2013    Procedure: RIGHT WRIST HARDWARE REMOVAL, JOINT RELEASE, RIGHT HAND MANIPULATION UNDER ANESTHESIA;  Surgeon: Linna Hoff, MD;  Location: [facility];  Service: Orthopedics;  Laterality: Right;  . Wrist arthroscopy with debridement Right 07/21/2014    Procedure: RIGHT WRIST DISTAL RADIAL ULNAR JOINT DEBRIDEMENT AND JOINT RELEASE POSSIBLE TENDON INTERPOSITION;  Surgeon: Linna Hoff, MD;  Location: [facility];  Service: Orthopedics;  Laterality: Right;  . Arthrotomy Right 11/09/2015   Procedure: RIGHT WRIST DISTAL RADIAL ULNAR JOINT ARTHROTOMY AND DEBRIDEMENT,  AND ;  Surgeon: Iran Planas, MD;  Location: [facility];  Service: Orthopedics;  Laterality: Right;  . Wrist arthroplasty Right 11/09/2015    Procedure: OR DISTAL ULNAR RESECTION ARTHROPLASTY ;  Surgeon: Iran Planas, MD;  Location: [facility];  Service: Orthopedics;  Laterality: Right;    There were no vitals filed for this visit.  Visit Diagnosis:  Stiffness of right wrist, not elsewhere classified  Stiffness of right elbow, not elsewhere classified  Muscle weakness (generalized)  Pain in right wrist  Pain in right hand      Subjective Assessment - 12/16/15 1032    Subjective  Pt reports something in her Rt eye today but denies headache. Noted redness from rubbing eye but did not appear to have ruptured blood vessel.    Pertinent History 11/09/15: Rt ulna osteoplasty with resection of 1 cm of distal ulnar shaft, Rt wrist stabalization and ECU tendon transfer and tenolysis due to Rt distal radius malunion. 07/21/14: Rt wrist arthoscopy and tendon releases. 11/08/12: ORIF Rt distal radius fx   Limitations ok for A/ROM and P/ROM at wrist, ok for strengthening to hand - NO strengthening to wrist until after next MD appt 01/12/16   Patient Stated Goals To get some mobility back in the wrist   Currently in Pain? Yes   Pain Score 8   0/10 at rest  Pain Location Wrist   Pain Orientation Right   Pain Descriptors / Indicators Aching   Pain Type Chronic pain   Pain Onset More than a month ago   Pain Frequency Intermittent   Aggravating Factors  rain, cold, P/ROM   Pain Relieving Factors rest, heat, pain meds                      OT Treatments/Exercises (OP) - 12/16/15 0001    ADLs   ADL Comments (p) Discussed A/E needs and modifications for difficult tasks. Pt provided handouts on potential A/E needs and told how/where to purchase if she pursues.    Wrist Exercises   Other wrist exercises (p) P/ROM to wrist  in extension   Hand Exercises   PIPJ Flexion (p) PROM;Right  each digit Rt hand   RUE Paraffin   Number Minutes Paraffin 10 Minutes   RUE Paraffin Location Hand   Comments at beginning of session to decrease stiffness and pain                OT Education - 12/15/15 1018    Education provided Yes   Education Details P/ROM HEP for wrist extension, supination, and PIP flexion all digits   Person(s) Educated Patient   Methods Explanation;Demonstration;Handout   Comprehension Verbalized understanding;Returned demonstration          OT Short Term Goals - 12/15/15 1047    OT SHORT TERM GOAL #1   Title Indepndent with HEP (due 12/25/15)   Time 4   Period Weeks   Status Achieved   OT SHORT TERM GOAL #2   Title Pt will report pain less than or equal to 4/10 with activity and exercises   Baseline 5/10 at rest   Time 4   Period Weeks   Status On-going   OT SHORT TERM GOAL #3   Title Pt will consistently be mod I with buttoning and zipping with A/E prn   Time 4   Period Weeks   Status On-going   OT SHORT TERM GOAL #4   Title Pt will verbalize understanding with potential A/E needs and how to acquire   Time 4   Period Weeks   Status On-going           OT Long Term Goals - 11/25/15 1242    OT LONG TERM GOAL #1   Title Pt will be I with upgraded HEP prn - due 01/23/16   Time 8   Period Weeks   Status New   OT LONG TERM GOAL #2   Title Pt will improve wrist extension to neutral or greater for functional tasks   Baseline -10*   Time 8   Period Weeks   Status New   OT LONG TERM GOAL #3   Title Pt will demo 50% or greater composite finger flex/ext for gross grasping/releasing   Baseline approx. 40%   Time 8   Period Weeks   Status New   OT LONG TERM GOAL #4   Title Pt to eat 80% or greater with Rt hand    Baseline approx. 50%   Time 8   Period Weeks   Status New               Plan - 12/16/15 1217    Clinical Impression Statement Approximating  STG's. Pt limited by pain and stiffness    Plan assess remaining STG's, continue A/ROM, P/ROM and hand strengthening   Consulted and Agree with  Plan of Care Patient        Problem List Patient Active Problem List   Diagnosis Date Noted  . Body aches 11/11/2015  . Insomnia 10/30/2015  . Oral herpes 10/19/2015  . Trichomonas infection 06/08/2015  . Healthcare maintenance 06/08/2015  . Arthritis of spine 03/27/2015  . Fall due to wet surface 03/27/2015  . Dizziness 02/11/2015  . Edema 02/11/2015  . Lumbar spine pain 01/09/2015  . Herpetic lesion 02/07/2014  . Intertriginous candidiasis 02/07/2014  . Depression 07/19/2013  . Distal radius fracture, right 10/20/2012  . Hordeolum 10/03/2012  . Intermittent low back pain 03/30/2012  . Heavy smoker (more than 20 cigarettes per day) 03/30/2012  . Health maintenance examination 03/30/2012  . Hot flashes, menopausal 02/28/2012  . Anxiety 02/28/2012  . Restless leg syndrome 02/28/2012  . Metatarsalgia of both feet 02/28/2012    Carey Bullocks, OTR/L 12/16/2015, 12:19 PM  [facility] [address] [place], [place], [address] Phone: [phone]   Fax:  [phone]  Name: [name] MRN: [id number] Date of Birth: 24-Jul-1956

## 2015-12-21 ENCOUNTER — Ambulatory Visit: Payer: Medicare Other | Admitting: Occupational Therapy

## 2015-12-21 ENCOUNTER — Telehealth: Payer: Self-pay | Admitting: Psychology

## 2015-12-21 NOTE — Addendum Note (Signed)
Addended by: Hans Eden on: 12/21/2015 01:26 PM   Modules accepted: Orders

## 2015-12-21 NOTE — Telephone Encounter (Signed)
Called patient to reschedule appointment originally scheduled for 4/14 due to clinic closure for Good Friday holiday. Patient answered and rescheduled to 4/21 at 1:30 PM.

## 2015-12-23 ENCOUNTER — Ambulatory Visit: Payer: Medicare Other | Admitting: Occupational Therapy

## 2015-12-23 ENCOUNTER — Telehealth: Payer: Self-pay | Admitting: Student

## 2015-12-23 ENCOUNTER — Encounter: Payer: Self-pay | Admitting: Occupational Therapy

## 2015-12-23 DIAGNOSIS — M6281 Muscle weakness (generalized): Secondary | ICD-10-CM | POA: Diagnosis not present

## 2015-12-23 DIAGNOSIS — M79641 Pain in right hand: Secondary | ICD-10-CM

## 2015-12-23 DIAGNOSIS — M25531 Pain in right wrist: Secondary | ICD-10-CM | POA: Diagnosis not present

## 2015-12-23 DIAGNOSIS — M25631 Stiffness of right wrist, not elsewhere classified: Secondary | ICD-10-CM | POA: Diagnosis not present

## 2015-12-23 DIAGNOSIS — G8929 Other chronic pain: Secondary | ICD-10-CM | POA: Diagnosis not present

## 2015-12-23 DIAGNOSIS — M25621 Stiffness of right elbow, not elsewhere classified: Secondary | ICD-10-CM | POA: Diagnosis not present

## 2015-12-23 NOTE — Telephone Encounter (Signed)
Please schedule patient to be see in clinic to discuss antidepressant change

## 2015-12-23 NOTE — Telephone Encounter (Signed)
Patient calling to say that the medication citalopram is not helping.  Had better results from previous medication she was taking.  Would like to have for that sent in or pick up.  Call patient to inform.  Also need something for her hot flashes.

## 2015-12-23 NOTE — Therapy (Signed)
[facility] [address] [place], [place], [address] Phone: [phone]   Fax:  [phone]  Occupational Therapy Treatment  Patient Details  Name: [name] MRN: [id number] Date of Birth: 04-Sep-1956 Referring Provider: Dr. Iran Planas  Encounter Date: 12/23/2015      OT End of Session - 12/23/15 0928    Visit Number 8   Number of Visits 17   Date for OT Re-Evaluation 01/23/16   Authorization Type UHC MCR - G code needed   Authorization - Visit Number 8   Authorization - Number of Visits 10   OT Start Time 0850   OT Stop Time 0930   OT Time Calculation (min) 40 min   Activity Tolerance Patient tolerated treatment well      Past Medical History  Diagnosis Date  . Anxiety 2009  . Arthritis 2000  . Fibroid 2003  . Hot flashes, menopausal 2012  . Allergy     seasonal  . RLS (restless legs syndrome)   . Wears glasses   . Depression   . Shortness of breath     with exertion denies    Past Surgical History  Procedure Laterality Date  . Neck surgery  2003    Cervical vertebroplasty   . Colonoscopy    . Open reduction internal fixation (orif) distal radial fracture Right 11/08/2012    Procedure: OPEN REDUCTION INTERNAL FIXATION (ORIF) RIGHT DISTAL RADIUS FRACTURE;  Surgeon: Linna Hoff, MD;  Location: [facility];  Service: Orthopedics;  Laterality: Right;  . Hardware removal Right 07/24/2013    Procedure: RIGHT WRIST HARDWARE REMOVAL, JOINT RELEASE, RIGHT HAND MANIPULATION UNDER ANESTHESIA;  Surgeon: Linna Hoff, MD;  Location: [facility];  Service: Orthopedics;  Laterality: Right;  . Wrist arthroscopy with debridement Right 07/21/2014    Procedure: RIGHT WRIST DISTAL RADIAL ULNAR JOINT DEBRIDEMENT AND JOINT RELEASE POSSIBLE TENDON INTERPOSITION;  Surgeon: Linna Hoff, MD;  Location: [facility];  Service: Orthopedics;  Laterality: Right;  . Arthrotomy Right 11/09/2015   Procedure: RIGHT WRIST DISTAL RADIAL ULNAR JOINT ARTHROTOMY AND DEBRIDEMENT,  AND ;  Surgeon: Iran Planas, MD;  Location: [facility];  Service: Orthopedics;  Laterality: Right;  . Wrist arthroplasty Right 11/09/2015    Procedure: OR DISTAL ULNAR RESECTION ARTHROPLASTY ;  Surgeon: Iran Planas, MD;  Location: [facility];  Service: Orthopedics;  Laterality: Right;    There were no vitals filed for this visit.      Subjective Assessment - 12/23/15 0900    Subjective  I'm still wearing my flexion glove but usually only 1x/day. (Pt encouraged to wear 3x/day)   Pertinent History 11/09/15: Rt ulna osteoplasty with resection of 1 cm of distal ulnar shaft, Rt wrist stabalization and ECU tendon transfer and tenolysis due to Rt distal radius malunion. 07/21/14: Rt wrist arthoscopy and tendon releases. 11/08/12: ORIF Rt distal radius fx   Limitations ok for A/ROM and P/ROM at wrist, ok for strengthening to hand - NO strengthening to wrist until after next MD appt 01/12/16   Patient Stated Goals To get some mobility back in the wrist   Currently in Pain? Yes   Pain Score 9    Pain Location Wrist   Pain Orientation Right   Pain Descriptors / Indicators Aching;Nagging   Pain Type Chronic pain   Pain Onset More than a month ago   Pain Frequency Intermittent   Aggravating Factors  rain, cold, P/ROM   Pain Relieving Factors  rest, heat, pain meds                      OT Treatments/Exercises (OP) - 12/23/15 0001    ADLs   ADL Comments Assessed STG's and progress to date   Hand Exercises   PIPJ Flexion PROM;Right   Other Hand Exercises Gripper set at 10 lbs resistance to pick up blocks Rt hand for ROM to fingers and sustained grip strength   Other Hand Exercises Yellow digiflex for PIP flexion x 10 reps each digit   RUE Paraffin   Number Minutes Paraffin 10 Minutes   RUE Paraffin Location Hand   Comments at beginning of session to decr stiffness and pain   Manual Therapy   Passive ROM Passive finger  flexion right hand, composite and isolated digits for PIP flexion. Passive wrist extension to approx. 15 degrees past neutral                  OT Short Term Goals - 12/23/15 0929    OT SHORT TERM GOAL #1   Title Indepndent with HEP (due 12/25/15)   Time 4   Period Weeks   Status Achieved   OT SHORT TERM GOAL #2   Title Pt will report pain less than or equal to 4/10 with activity and exercises   Baseline 5/10 at rest   Time 4   Period Weeks   Status Not Met  pain up to 9/10   OT SHORT TERM GOAL #3   Title Pt will consistently be mod I with buttoning and zipping with A/E prn   Time 4   Period Weeks   Status Achieved   OT SHORT TERM GOAL #4   Title Pt will verbalize understanding with potential A/E needs and how to acquire   Time 4   Period Weeks   Status Achieved           OT Long Term Goals - 11/25/15 1242    OT LONG TERM GOAL #1   Title Pt will be I with upgraded HEP prn - due 01/23/16   Time 8   Period Weeks   Status New   OT LONG TERM GOAL #2   Title Pt will improve wrist extension to neutral or greater for functional tasks   Baseline -10*   Time 8   Period Weeks   Status New   OT LONG TERM GOAL #3   Title Pt will demo 50% or greater composite finger flex/ext for gross grasping/releasing   Baseline approx. 40%   Time 8   Period Weeks   Status New   OT LONG TERM GOAL #4   Title Pt to eat 80% or greater with Rt hand    Baseline approx. 50%   Time 8   Period Weeks   Status New               Plan - 12/23/15 0929    Clinical Impression Statement Pt met 3/4 STG's. Pt still limited by pain and stiffness   Plan continue paraffin, A/ROM, P/ROM, and hand strengthening   OT Home Exercise Plan theraputty - red      Patient will benefit from skilled therapeutic intervention in order to improve the following deficits and impairments:  Decreased coordination, Decreased range of motion, Impaired flexibility, Increased edema, Impaired sensation,  Decreased activity tolerance, Decreased knowledge of precautions, Decreased scar mobility, Impaired UE functional use, Pain, Decreased mobility, Decreased strength  Visit Diagnosis: Stiffness of  right wrist, not elsewhere classified  Muscle weakness (generalized)  Pain in right hand    Problem List Patient Active Problem List   Diagnosis Date Noted  . Body aches 11/11/2015  . Insomnia 10/30/2015  . Oral herpes 10/19/2015  . Trichomonas infection 06/08/2015  . Healthcare maintenance 06/08/2015  . Arthritis of spine 03/27/2015  . Fall due to wet surface 03/27/2015  . Dizziness 02/11/2015  . Edema 02/11/2015  . Lumbar spine pain 01/09/2015  . Herpetic lesion 02/07/2014  . Intertriginous candidiasis 02/07/2014  . Depression 07/19/2013  . Distal radius fracture, right 10/20/2012  . Hordeolum 10/03/2012  . Intermittent low back pain 03/30/2012  . Heavy smoker (more than 20 cigarettes per day) 03/30/2012  . Health maintenance examination 03/30/2012  . Hot flashes, menopausal 02/28/2012  . Anxiety 02/28/2012  . Restless leg syndrome 02/28/2012  . Metatarsalgia of both feet 02/28/2012    Carey Bullocks, OTR/L 12/23/2015, 9:31 AM  [facility] [address] [place], [place], [address] Phone: [phone]   Fax:  [phone]  Name: [name] MRN: [id number] Date of Birth: Oct 30, 1955

## 2015-12-23 NOTE — Telephone Encounter (Signed)
Will forward to MD to advise. Jazmin Hartsell,CMA  

## 2015-12-24 NOTE — Telephone Encounter (Signed)
Pt has appt on 4/21 with integrated care.  Will that work? [name], [name], CMA

## 2015-12-25 ENCOUNTER — Ambulatory Visit: Payer: Medicaid Other

## 2015-12-26 NOTE — Telephone Encounter (Signed)
Appointment with integrated care on 4/21 will be fine to discuss antidepressant change unless patient prefers earlier to be seen

## 2015-12-30 ENCOUNTER — Encounter: Payer: Medicare Other | Admitting: Occupational Therapy

## 2016-01-01 ENCOUNTER — Ambulatory Visit: Payer: Medicare Other | Admitting: Occupational Therapy

## 2016-01-01 ENCOUNTER — Ambulatory Visit (INDEPENDENT_AMBULATORY_CARE_PROVIDER_SITE_OTHER): Payer: Medicare Other | Admitting: Psychology

## 2016-01-01 DIAGNOSIS — F419 Anxiety disorder, unspecified: Secondary | ICD-10-CM

## 2016-01-01 NOTE — Assessment & Plan Note (Signed)
Patient reported that she has not had any panic attacks in the past three weeks since her last appointment. She generally feels much better, and states that her "spirits are lifted" because she found out that she will soon have a grandchild. However, she reported that citalopram has not been effective in treating her anxiety, and would prefer to switch back to her old medication. She was unable to remember her last medication; [facility] asked if it was "sertraline" (which is in her chart), and she could not remember. She denied missing any doses. She also currently has a prescription for lorazepam, but reported that she infrequently takes it. Given reported infrequency of patient's panic, and that patient effectively demonstrated coping skills taught in previous appointments, patient and [facility] agreed to end regular appointments. [facility] informed patient to schedule an appointment if she feels that her symptoms of panic return.  Due to patient's difficulty recalling autobiographical information and occasional cognitive disorganization in past appointments, and patient's prescription for a benzodiazepine, [facility] administered a Mini Mental Status Exam to the patient. Her overall score was a 24/30, which is the lower limit for normal cognitive functioning (scores lower than 24 indicate potential cognitive impairment). Patient was oriented to time (5/5) and place (5/5). She was able to immediately repeat a list of three objects (3/3). When asked to spell "world" backwards, she spelled "DLOW" (4/5). When asked to recall the list of three objects, she was able to recall one (1/3). She was able to name a pencil and a watch (2/2), but was not able to repeat the phrase "no if's and's or but's" - she omitted the "s" from the end of each word. When given a three-stage instruction to take a piece of paper, fold it in half, and place it on the floor, she omitted the second instruction (2/3). She was able to read and follow an instruction  (0/1) and was able to write a sentence. However, she was not able to draw two overlapping pentagons, instead drawing a square and a circle (0/1) - she stated she knew that it was incorrect, but is "bad at drawing".   Overall, patient struggled with items that required basic short-term memory (e.g., remembering and following three instructions, recalling three objects following brief interference), as well as two other items. Although her score was in the "normal" range, her mental status should continue to be monitored at future appointments, especially given her continued use (albeit infrequent) of a benzodiazepine.

## 2016-01-01 NOTE — Progress Notes (Signed)
Reason for follow-up: [name] followed up to treat symptoms of panic and anxiety.  Issues discussed: Current symptoms, medication efficacy, memory.  Identified goals: Patient would like to reduce symptoms of panic.  PHQ-9: 5 (did not endorse first two items). Patient reported problems are Somewhat Difficult.

## 2016-01-15 DIAGNOSIS — S52561D Barton's fracture of right radius, subsequent encounter for closed fracture with routine healing: Secondary | ICD-10-CM | POA: Diagnosis not present

## 2016-01-21 ENCOUNTER — Ambulatory Visit: Payer: Medicare Other | Attending: Orthopedic Surgery | Admitting: Occupational Therapy

## 2016-01-21 ENCOUNTER — Encounter: Payer: Self-pay | Admitting: Occupational Therapy

## 2016-01-21 DIAGNOSIS — M6281 Muscle weakness (generalized): Secondary | ICD-10-CM | POA: Diagnosis not present

## 2016-01-21 DIAGNOSIS — M25531 Pain in right wrist: Secondary | ICD-10-CM | POA: Diagnosis not present

## 2016-01-21 DIAGNOSIS — M25621 Stiffness of right elbow, not elsewhere classified: Secondary | ICD-10-CM | POA: Insufficient documentation

## 2016-01-21 DIAGNOSIS — M25641 Stiffness of right hand, not elsewhere classified: Secondary | ICD-10-CM | POA: Diagnosis not present

## 2016-01-21 DIAGNOSIS — M79641 Pain in right hand: Secondary | ICD-10-CM | POA: Diagnosis not present

## 2016-01-21 DIAGNOSIS — M25631 Stiffness of right wrist, not elsewhere classified: Secondary | ICD-10-CM | POA: Insufficient documentation

## 2016-01-21 NOTE — Therapy (Signed)
[name] [address] [place], [place], [address] Phone: [phone]   Fax:  [phone]  Occupational Therapy Treatment  Patient Details  Name: [name] MRN: [id number] Date of Birth: 09-25-1955 Referring Provider: Dr. Iran Planas  Encounter Date: 01/21/2016      OT End of Session - 01/21/16 1101    Visit Number 9   Number of Visits 17   Date for OT Re-Evaluation 01/23/16   Authorization Type [employer] MCR - G code needed   Authorization - Visit Number 9   Authorization - Number of Visits 10   OT Start Time 1324   OT Stop Time 1100   OT Time Calculation (min) 45 min   Activity Tolerance Patient tolerated treatment well      Past Medical History  Diagnosis Date  . Anxiety 2009  . Arthritis 2000  . Fibroid 2003  . Hot flashes, menopausal 2012  . Allergy     seasonal  . RLS (restless legs syndrome)   . Wears glasses   . Depression   . Shortness of breath     with exertion denies    Past Surgical History  Procedure Laterality Date  . Neck surgery  2003    Cervical vertebroplasty   . Colonoscopy    . Open reduction internal fixation (orif) distal radial fracture Right 11/08/2012    Procedure: OPEN REDUCTION INTERNAL FIXATION (ORIF) RIGHT DISTAL RADIUS FRACTURE;  Surgeon: Linna Hoff, MD;  Location: [facility];  Service: Orthopedics;  Laterality: Right;  . Hardware removal Right 07/24/2013    Procedure: RIGHT WRIST HARDWARE REMOVAL, JOINT RELEASE, RIGHT HAND MANIPULATION UNDER ANESTHESIA;  Surgeon: Linna Hoff, MD;  Location: [facility];  Service: Orthopedics;  Laterality: Right;  . Wrist arthroscopy with debridement Right 07/21/2014    Procedure: RIGHT WRIST DISTAL RADIAL ULNAR JOINT DEBRIDEMENT AND JOINT RELEASE POSSIBLE TENDON INTERPOSITION;  Surgeon: Linna Hoff, MD;  Location: [facility];  Service: Orthopedics;  Laterality: Right;  . Arthrotomy Right 11/09/2015     Procedure: RIGHT WRIST DISTAL RADIAL ULNAR JOINT ARTHROTOMY AND DEBRIDEMENT,  AND ;  Surgeon: Iran Planas, MD;  Location: [facility];  Service: Orthopedics;  Laterality: Right;  . Wrist arthroplasty Right 11/09/2015    Procedure: OR DISTAL ULNAR RESECTION ARTHROPLASTY ;  Surgeon: Iran Planas, MD;  Location: [facility];  Service: Orthopedics;  Laterality: Right;    There were no vitals filed for this visit.      Subjective Assessment - 01/21/16 1020    Subjective  I didn't have transportation and then I needed to see Dr. Caralyn Guile. (Pt had not come to therapy in almost a month - pt no showed x 2, and cancelled x 1, then ran out of appointments and did not call to reschedule)    Pertinent History 11/09/15: Rt ulna osteoplasty with resection of 1 cm of distal ulnar shaft, Rt wrist stabalization and ECU tendon transfer and tenolysis due to Rt distal radius malunion. 07/21/14: Rt wrist arthoscopy and tendon releases. 11/08/12: ORIF Rt distal radius fx   Limitations ok for A/ROM and P/ROM at wrist, ok for strengthening to hand - NOW cleared for strengthening to wrist/forearm   Patient Stated Goals To get some mobility back in the wrist   Currently in Pain? Yes   Pain Score --  Pt would not rank at beginning, but 10/10 with P/ROM and estim   Pain Location Wrist   Pain Orientation Right  Pain Descriptors / Indicators Aching;Nagging   Pain Type Chronic pain   Pain Onset More than a month ago   Pain Frequency Intermittent   Aggravating Factors  rain, cold, P/ROM    Pain Relieving Factors rest, heat, pain meds                      OT Treatments/Exercises (OP) - 01/21/16 0001    ADLs   ADL Comments When asked if pt was still wearing flexion glove, she reported "all the time". Pt instructed to only wear as instructed - 3x/day for 15 minute sessions each. Pt agreed.  Pt returns today after 30 days d/t cancel, 2 no shows, and letting appointments run out. Pt with new O.T. orders, however MD reports  ok to continue with current POC.    Hand Exercises   Other Hand Exercises see updated HEP for wrist/forearm strengthening   Electrical Stimulation   Electrical Stimulation Location volar forearm    Electrical Stimulation Action finger flexion (as able)    Electrical Stimulation Parameters 50 pps, 250 pw, 10 sec. on/off cycle, Int = 14, x 8 minutues   Electrical Stimulation Goals Neuromuscular facilitation  ROM   RUE Paraffin   Number Minutes Paraffin 10 Minutes   RUE Paraffin Location Hand   Comments at beginning of session to decr. stiffness and pain   Manual Therapy   Passive ROM Passive finger flexion right hand, composite and isolated digits for PIP flexion. Passive wrist extension to approx. 15 degrees past neutral                OT Education - 01/21/16 1048    Education provided Yes   Education Details wrist/forearm strengthening HEP    Person(s) Educated Patient   Methods Explanation;Demonstration;Handout   Comprehension Verbalized understanding;Returned demonstration          OT Short Term Goals - 12/23/15 0929    OT SHORT TERM GOAL #1   Title Indepndent with HEP (due 12/25/15)   Time 4   Period Weeks   Status Achieved   OT SHORT TERM GOAL #2   Title Pt will report pain less than or equal to 4/10 with activity and exercises   Baseline 5/10 at rest   Time 4   Period Weeks   Status Not Met  pain up to 9/10   OT SHORT TERM GOAL #3   Title Pt will consistently be mod I with buttoning and zipping with A/E prn   Time 4   Period Weeks   Status Achieved   OT SHORT TERM GOAL #4   Title Pt will verbalize understanding with potential A/E needs and how to acquire   Time 4   Period Weeks   Status Achieved           OT Long Term Goals - 11/25/15 1242    OT LONG TERM GOAL #1   Title Pt will be I with upgraded HEP prn - due 01/23/16   Time 8   Period Weeks   Status New   OT LONG TERM GOAL #2   Title Pt will improve wrist extension to neutral or greater  for functional tasks   Baseline -10*   Time 8   Period Weeks   Status New   OT LONG TERM GOAL #3   Title Pt will demo 50% or greater composite finger flex/ext for gross grasping/releasing   Baseline approx. 40%   Time 8   Period Weeks  Status New   OT LONG TERM GOAL #4   Title Pt to eat 80% or greater with Rt hand    Baseline approx. 50%   Time 8   Period Weeks   Status New               Plan - 01/21/16 1202    Clinical Impression Statement Pt returns after 30 days secondary to no shows/cancels. Pt with new MD order however still under current POC. MD reports no new changes and ok to continue under current POC.    Rehab Potential Fair   Clinical Impairments Affecting Rehab Potential low tolerance to P/ROM, poor carryover, inconsistent attendance   OT Frequency 2x / week   OT Duration 8 weeks   OT Treatment/Interventions Self-care/ADL training;Therapeutic exercise;Patient/family education;Ultrasound;Neuromuscular education;Manual Therapy;Splinting;Cryotherapy;Parrafin;DME and/or AE instruction;Compression bandaging;Therapeutic activities;Electrical Stimulation;Fluidtherapy;Scar mobilization;Moist Heat;Contrast Bath;Passive range of motion   Plan Pt will need G-code and renewal at next visit. Pt agrees to continue 2x/wk for 4 weeks and stressed importance of following recommendations and consistent attendance   Consulted and Agree with Plan of Care Patient      Patient will benefit from skilled therapeutic intervention in order to improve the following deficits and impairments:  Decreased coordination, Decreased range of motion, Impaired flexibility, Increased edema, Impaired sensation, Decreased activity tolerance, Decreased knowledge of precautions, Decreased scar mobility, Impaired UE functional use, Pain, Decreased mobility, Decreased strength  Visit Diagnosis: Stiffness of right wrist, not elsewhere classified  Muscle weakness (generalized)  Pain in right  hand    Problem List Patient Active Problem List   Diagnosis Date Noted  . Body aches 11/11/2015  . Insomnia 10/30/2015  . Oral herpes 10/19/2015  . Trichomonas infection 06/08/2015  . Healthcare maintenance 06/08/2015  . Arthritis of spine 03/27/2015  . Fall due to wet surface 03/27/2015  . Dizziness 02/11/2015  . Edema 02/11/2015  . Lumbar spine pain 01/09/2015  . Herpetic lesion 02/07/2014  . Intertriginous candidiasis 02/07/2014  . Depression 07/19/2013  . Distal radius fracture, right 10/20/2012  . Hordeolum 10/03/2012  . Intermittent low back pain 03/30/2012  . Heavy smoker (more than 20 cigarettes per day) 03/30/2012  . Health maintenance examination 03/30/2012  . Hot flashes, menopausal 02/28/2012  . Anxiety 02/28/2012  . Restless leg syndrome 02/28/2012  . Metatarsalgia of both feet 02/28/2012    Carey Bullocks, OTR/L  01/21/2016, 12:07 PM  [facility] [address] [place], [place], [address] Phone: [phone]   Fax:  [phone]  Name: [name] MRN: [id number] Date of Birth: 1956-01-08

## 2016-01-21 NOTE — Patient Instructions (Signed)
Wrist Extension: Resisted    With right palm down, __1-2__ pound weight in hand, bend wrist up. Return slowly. Repeat __15__ times per set.  Do __2__ sessions per day.   Flexion (Resistive)    With hand palm-up and holding _1-2 lbs, bend hand toward you at wrist. Hold __3__ seconds. Relax slowly. Repeat __15__ times. Do ___2_ sessions per day.   Pronation / Supination (Resistive)    Hold hammer weighing _16___ ounces and rotate palm up and down, hold 10 sec. Mar[place] Kitchen Keep elbow flexed at side and wrist straight. Repeat _15___ times. Do _2___ sessions per day.

## 2016-01-25 ENCOUNTER — Encounter: Payer: Self-pay | Admitting: Occupational Therapy

## 2016-01-25 ENCOUNTER — Ambulatory Visit: Payer: Medicare Other | Admitting: Occupational Therapy

## 2016-01-25 DIAGNOSIS — M25531 Pain in right wrist: Secondary | ICD-10-CM

## 2016-01-25 DIAGNOSIS — M79641 Pain in right hand: Secondary | ICD-10-CM

## 2016-01-25 DIAGNOSIS — M6281 Muscle weakness (generalized): Secondary | ICD-10-CM

## 2016-01-25 DIAGNOSIS — M25631 Stiffness of right wrist, not elsewhere classified: Secondary | ICD-10-CM | POA: Diagnosis not present

## 2016-01-25 DIAGNOSIS — M25621 Stiffness of right elbow, not elsewhere classified: Secondary | ICD-10-CM

## 2016-01-25 DIAGNOSIS — M25641 Stiffness of right hand, not elsewhere classified: Secondary | ICD-10-CM | POA: Diagnosis not present

## 2016-01-25 NOTE — Therapy (Signed)
[facility] [address] [place], [place], [address] Phone: [phone]   Fax:  [phone]  Occupational Therapy Treatment  Patient Details  Name: [name] MRN: [id number] Date of Birth: [birth year] Referring Provider: Dr. Iran Planas  Encounter Date: 01/25/2016      OT End of Session - 01/25/16 1646    Visit Number 10   Number of Visits 17   Date for OT Re-Evaluation 03/14/16   Authorization Type UHC MCR - G code needed   Authorization - Visit Number 10   Authorization - Number of Visits 17   OT Start Time 1529   OT Stop Time 1618   OT Time Calculation (min) 49 min   Activity Tolerance Patient tolerated treatment well   Behavior During Therapy [facility] for tasks assessed/performed      Past Medical History  Diagnosis Date  . Anxiety 2009  . Arthritis 2000  . Fibroid 2003  . Hot flashes, menopausal 2012  . Allergy     seasonal  . RLS (restless legs syndrome)   . Wears glasses   . Depression   . Shortness of breath     with exertion denies    Past Surgical History  Procedure Laterality Date  . Neck surgery  2003    Cervical vertebroplasty   . Colonoscopy    . Open reduction internal fixation (orif) distal radial fracture Right 11/08/2012    Procedure: OPEN REDUCTION INTERNAL FIXATION (ORIF) RIGHT DISTAL RADIUS FRACTURE;  Surgeon: Linna Hoff, MD;  Location: [place];  Service: Orthopedics;  Laterality: Right;  . Hardware removal Right 07/24/2013    Procedure: RIGHT WRIST HARDWARE REMOVAL, JOINT RELEASE, RIGHT HAND MANIPULATION UNDER ANESTHESIA;  Surgeon: Linna Hoff, MD;  Location: [place];  Service: Orthopedics;  Laterality: Right;  . Wrist arthroscopy with debridement Right 07/21/2014    Procedure: RIGHT WRIST DISTAL RADIAL ULNAR JOINT DEBRIDEMENT AND JOINT RELEASE POSSIBLE TENDON INTERPOSITION;  Surgeon: Linna Hoff, MD;  Location: [place];  Service:  Orthopedics;  Laterality: Right;  . Arthrotomy Right 11/09/2015    Procedure: RIGHT WRIST DISTAL RADIAL ULNAR JOINT ARTHROTOMY AND DEBRIDEMENT,  AND ;  Surgeon: Iran Planas, MD;  Location: [place];  Service: Orthopedics;  Laterality: Right;  . Wrist arthroplasty Right 11/09/2015    Procedure: OR DISTAL ULNAR RESECTION ARTHROPLASTY ;  Surgeon: Iran Planas, MD;  Location: [place];  Service: Orthopedics;  Laterality: Right;    There were no vitals filed for this visit.      Subjective Assessment - 01/25/16 1625    Subjective  It's not too good today.  I am trying to use it as much as I can.  (Regarding right hand)   Pertinent History 11/09/15: Rt ulna osteoplasty with resection of 1 cm of distal ulnar shaft, Rt wrist stabalization and ECU tendon transfer and tenolysis due to Rt distal radius malunion. 07/21/14: Rt wrist arthoscopy and tendon releases. 11/08/12: ORIF Rt distal radius fx   Limitations ok for A/ROM and P/ROM at wrist, ok for strengthening to hand - NOW cleared for strengthening to wrist/forearm   Patient Stated Goals To get some mobility back in the wrist   Currently in Pain? Yes   Pain Score 7    Pain Location Wrist   Pain Orientation Right   Pain Descriptors / Indicators Aching   Pain Type Chronic pain   Pain Onset More than a month ago   Pain Frequency Intermittent  Aggravating Factors  use of right hand   Pain Relieving Factors rest, heat            [facility] OT Assessment - 01/25/16 0001    Coordination   9 Hole Peg Test Right;Left   Right 9 Hole Peg Test 30.22   Left 9 Hole Peg Test 24.82   Right Hand AROM   R Index  MCP 0-90 70 Degrees   R Index PIP 0-100 60 Degrees   R Long  MCP 0-90 70 Degrees   R Long PIP 0-100 65 Degrees   R Ring  MCP 0-90 65 Degrees   R Ring PIP 0-100 60 Degrees   R Little  MCP 0-90 60 Degrees   R Little PIP 0-100 50 Degrees   Right Hand PROM   R Index  MCP 0-90 75 Degrees   R Index PIP 0-100 70 Degrees   R Long  MCP 0-90 75 Degrees   R  Long PIP 0-100 70 Degrees   R Ring  MCP 0-90 70 Degrees   R Ring PIP 0-100 65 Degrees   R Little  MCP 0-90 60 Degrees   R Little PIP 0-100 55 Degrees   Hand Function   Right Hand Gross Grasp Impaired   Right Hand Grip (lbs) 15   Right Hand Lateral Pinch 13 lbs   Left Hand Gross Grasp Functional   Left Hand Grip (lbs) 45   Left Hand Lateral Pinch 10 lbs                  OT Treatments/Exercises (OP) - 01/25/16 0001    ADLs   LB Dressing Patient able to tie shoes, but feels that she can not pull laces tight enough for safety, so she tucks laces into shoes.  Demonstrated technique in which she used a lateral pinch versus two point pinch to tighten laces.     Cooking Patient is attempting to incorporate her right hand in meal prep activities.  She reports difficulty peeling vegetables.  Reviewed comfort grip products which may improve effectiveness with grip for peeling , coring fruit / vegetables.  Also provided red and blue foam for building up handles of current kitchen utensils.     Writing Patient able to write with right hand, although has difficulty maintaining consistent pressure on pen, and has difficulty orienting her hand to paper with longer phrases.     Driving Patient indicates she can now turn key in ignition, ad shift car with right hand.   Wrist Exercises   Other wrist exercises Reviewed HEP for wrist extension, supination and pronation.  Patient is not using hammer for forearm rotation resistance - rather is using water bottle - which offers very little resistance.  Discussed this with patient and reviewed notion that moving more toward hammer head on shaft will reduce resistance for this exercise.  Patient appeared to understand.     RUE Paraffin   Number Minutes Paraffin 10 Minutes   RUE Paraffin Location Hand                OT Education - 01/25/16 1644    Education provided Yes   Education Details HEP Supination / pronation, Home activity program -  cooking using right hand, wearing schedule review - flexion glove, possible use of ultrasound to enhance stretching to wrist, forearm and digits   Person(s) Educated Patient   Methods Explanation   Comprehension Verbalized understanding  OT Short Term Goals - 12/23/15 0929    OT SHORT TERM GOAL #1   Title Indepndent with HEP (due 12/25/15)   Time 4   Period Weeks   Status Achieved   OT SHORT TERM GOAL #2   Title Pt will report pain less than or equal to 4/10 with activity and exercises   Baseline 5/10 at rest   Time 4   Period Weeks   Status Not Met  pain up to 9/10   OT SHORT TERM GOAL #3   Title Pt will consistently be mod I with buttoning and zipping with A/E prn   Time 4   Period Weeks   Status Achieved   OT SHORT TERM GOAL #4   Title Pt will verbalize understanding with potential A/E needs and how to acquire   Time 4   Period Weeks   Status Achieved           OT Long Term Goals - 01/25/16 1705    OT LONG TERM GOAL #1   Title Pt will be I with upgraded HEP prn - due 03/14/16   Baseline met for putty exercises   Time 8   Period Weeks   Status On-going   OT LONG TERM GOAL #2   Title Pt will improve wrist extension to neutral or greater for functional tasks   Baseline -10*   Time 8   Period Weeks   Status On-going   OT LONG TERM GOAL #3   Title Pt will demo 50% or greater composite finger flex/ext for gross grasping/releasing   Baseline approx. 40%   Time 8   Period Weeks   Status On-going   OT LONG TERM GOAL #4   Title Pt to eat 80% or greater with Rt hand in private not public setting   Baseline approx. 50%   Time 8   Period Weeks   Status On-going   OT LONG TERM GOAL #5   Title Patient will utilize right hand, as assist to left  75% time for meal preparation activities due 7/3   Baseline 25%   Time 8   Period Weeks   Status New   OT LONG TERM GOAL #6   Title Patient will utilize right hand to effectively cut with scissors (cut coupon from  paper) with increased time (right hand dominant)   Baseline Unable - uses bilateral technique   Time 8   Period Weeks   Status New               Plan - 01/25/16 1647    Clinical Impression Statement Patient with improvement in range of motion and functional use of right upper extremity due to improved active and passive motion in wrist, forearm, and digits.  Patient with consistent and chronic pain which limits functional use, although patient able to verbalize specific areas of functional improvement since onset of this rehab course.  Patient will benefit from  additional time to complete final OT visits to maximize motion, strength, improve pain / desensitize hand/wrist, and improve functional ise of Right UE with ADL/IADL.     Rehab Potential Good   OT Frequency 2x / week   OT Duration 8 weeks   OT Treatment/Interventions Self-care/ADL training;Therapeutic exercise;Patient/family education;Ultrasound;Neuromuscular education;Manual Therapy;Splinting;Cryotherapy;Parrafin;DME and/or AE instruction;Compression bandaging;Therapeutic activities;Electrical Stimulation;Fluidtherapy;Scar mobilization;Moist Heat;Contrast Bath;Passive range of motion   Plan Consider ultrasound to aide with stretching, motion in forearm, wrist and digits.  Review homework - peeling vegetables at home   Consulted and  Agree with Plan of Care Patient      Patient will benefit from skilled therapeutic intervention in order to improve the following deficits and impairments:  Decreased coordination, Decreased range of motion, Impaired flexibility, Increased edema, Impaired sensation, Decreased activity tolerance, Decreased knowledge of precautions, Decreased scar mobility, Impaired UE functional use, Pain, Decreased mobility, Decreased strength  Visit Diagnosis: Stiffness of right wrist, not elsewhere classified - Plan: Ot plan of care cert/re-cert  Muscle weakness (generalized) - Plan: Ot plan of care  cert/re-cert  Pain in right wrist - Plan: Ot plan of care cert/re-cert  Pain in right hand - Plan: Ot plan of care cert/re-cert  Stiffness of right elbow, not elsewhere classified - Plan: Ot plan of care cert/re-cert  Stiffness of right hand, not elsewhere classified - Plan: Ot plan of care cert/re-cert      G-Codes - 63/33/54 1659    Functional Assessment Tool Used 9 hole peg right 30.22, left 24.82, gross grasp- right 15 lbs, left 45 lbs.  Skilled clinical judgement- severe limitations to active and passive motion of forearm, wrist and digits right upper extremity   Functional Limitation Carrying, moving and handling objects   Carrying, Moving and Handling Objects Current Status [phone]) At least 60 percent but less than 80 percent impaired, limited or restricted   Carrying, Moving and Handling Objects Goal Status (L8937) At least 40 percent but less than 60 percent impaired, limited or restricted      Problem List Patient Active Problem List   Diagnosis Date Noted  . Body aches 11/11/2015  . Insomnia 10/30/2015  . Oral herpes 10/19/2015  . Trichomonas infection 06/08/2015  . Healthcare maintenance 06/08/2015  . Arthritis of spine 03/27/2015  . Fall due to wet surface 03/27/2015  . Dizziness 02/11/2015  . Edema 02/11/2015  . Lumbar spine pain 01/09/2015  . Herpetic lesion 02/07/2014  . Intertriginous candidiasis 02/07/2014  . Depression 07/19/2013  . Distal radius fracture, right 10/20/2012  . Hordeolum 10/03/2012  . Intermittent low back pain 03/30/2012  . Heavy smoker (more than 20 cigarettes per day) 03/30/2012  . Health maintenance examination 03/30/2012  . Hot flashes, menopausal 02/28/2012  . Anxiety 02/28/2012  . Restless leg syndrome 02/28/2012  . Metatarsalgia of both feet 02/28/2012   Occupational Therapy Progress Note  Dates of Reporting Period: 11/25/15- 01/25/16  Objective Reports of Subjective Statement: see above  Objective Measurements: see  above  Goal Update: see above  Plan: see above  Reason Skilled Services are Required: Patient had several missed visits due to transportation, and other reasons.  Anticipate 7-8 additional visits as initially planned for achievement of long term goals.  Patient is benefiting form current OT services and is  progressing with motion, strength, and functional use of right hand as indicated above.    Mariah Milling, OTR/L 01/25/2016, 5:10 PM  [facility] [address] [place], [place], [address] Phone: [phone]   Fax:  [phone]  Name: [name] MRN: [id number] Date of Birth: Jun 11, 1956

## 2016-01-27 ENCOUNTER — Encounter: Payer: Self-pay | Admitting: Occupational Therapy

## 2016-01-27 ENCOUNTER — Ambulatory Visit: Payer: Medicare Other | Admitting: Occupational Therapy

## 2016-01-27 DIAGNOSIS — M6281 Muscle weakness (generalized): Secondary | ICD-10-CM | POA: Diagnosis not present

## 2016-01-27 DIAGNOSIS — M25621 Stiffness of right elbow, not elsewhere classified: Secondary | ICD-10-CM | POA: Diagnosis not present

## 2016-01-27 DIAGNOSIS — M25641 Stiffness of right hand, not elsewhere classified: Secondary | ICD-10-CM | POA: Diagnosis not present

## 2016-01-27 DIAGNOSIS — M79641 Pain in right hand: Secondary | ICD-10-CM | POA: Diagnosis not present

## 2016-01-27 DIAGNOSIS — M25631 Stiffness of right wrist, not elsewhere classified: Secondary | ICD-10-CM

## 2016-01-27 DIAGNOSIS — M25531 Pain in right wrist: Secondary | ICD-10-CM | POA: Diagnosis not present

## 2016-01-27 NOTE — Therapy (Signed)
[facility] [address] [place], [place], [address] Phone: [phone]   Fax:  [phone]  Occupational Therapy Treatment  Patient Details  Name: [name] MRN: [id number] Date of Birth: April 28, 1956 Referring Provider: Dr. Iran Planas  Encounter Date: 01/27/2016      OT End of Session - 01/27/16 1721    Visit Number 11   Number of Visits 17   Date for OT Re-Evaluation 03/14/16   Authorization Type UHC MCR - G code needed   Authorization - Visit Number 11   Authorization - Number of Visits 17   OT Start Time 5397   OT Stop Time 1617   OT Time Calculation (min) 44 min   Activity Tolerance Patient tolerated treatment well   Behavior During Therapy [facility] for tasks assessed/performed      Past Medical History  Diagnosis Date  . Anxiety 2009  . Arthritis 2000  . Fibroid 2003  . Hot flashes, menopausal 2012  . Allergy     seasonal  . RLS (restless legs syndrome)   . Wears glasses   . Depression   . Shortness of breath     with exertion denies    Past Surgical History  Procedure Laterality Date  . Neck surgery  2003    Cervical vertebroplasty   . Colonoscopy    . Open reduction internal fixation (orif) distal radial fracture Right 11/08/2012    Procedure: OPEN REDUCTION INTERNAL FIXATION (ORIF) RIGHT DISTAL RADIUS FRACTURE;  Surgeon: Linna Hoff, MD;  Location: [place];  Service: Orthopedics;  Laterality: Right;  . Hardware removal Right 07/24/2013    Procedure: RIGHT WRIST HARDWARE REMOVAL, JOINT RELEASE, RIGHT HAND MANIPULATION UNDER ANESTHESIA;  Surgeon: Linna Hoff, MD;  Location: [place];  Service: Orthopedics;  Laterality: Right;  . Wrist arthroscopy with debridement Right 07/21/2014    Procedure: RIGHT WRIST DISTAL RADIAL ULNAR JOINT DEBRIDEMENT AND JOINT RELEASE POSSIBLE TENDON INTERPOSITION;  Surgeon: Linna Hoff, MD;  Location: [place];  Service:  Orthopedics;  Laterality: Right;  . Arthrotomy Right 11/09/2015    Procedure: RIGHT WRIST DISTAL RADIAL ULNAR JOINT ARTHROTOMY AND DEBRIDEMENT,  AND ;  Surgeon: Iran Planas, MD;  Location: [place];  Service: Orthopedics;  Laterality: Right;  . Wrist arthroplasty Right 11/09/2015    Procedure: OR DISTAL ULNAR RESECTION ARTHROPLASTY ;  Surgeon: Iran Planas, MD;  Location: [place];  Service: Orthopedics;  Laterality: Right;    There were no vitals filed for this visit.      Subjective Assessment - 01/27/16 1711    Subjective  She's doing good today.  (Patient just back from gym!)   Pertinent History 11/09/15: Rt ulna osteoplasty with resection of 1 cm of distal ulnar shaft, Rt wrist stabalization and ECU tendon transfer and tenolysis due to Rt distal radius malunion. 07/21/14: Rt wrist arthoscopy and tendon releases. 11/08/12: ORIF Rt distal radius fx   Limitations ok for A/ROM and P/ROM at wrist, ok for strengthening to hand - NOW cleared for strengthening to wrist/forearm   Patient Stated Goals To get some mobility back in the wrist   Currently in Pain? No/denies   Pain Score 0-No pain                      OT Treatments/Exercises (OP) - 01/27/16 0001    ADLs   Cooking Patient reported back that she was able to peel a potato and cut cabbage  using both hands.  Patient was very pleased to use her right hand for these tasks.  Patient used peeler effectively in right hand.     Elbow Exercises   Other elbow exercises Forearm supination with emphasis on radius rolling over ulna following ultrasound.  Patient tolerating approxiamtely 45 degrees of supination without discomfort.     Hand Exercises   Other Hand Exercises Did PROM to MCP and PIP to patient's tolerance, and then patient actively completed each motion.  Patient continues to be more restricted in flexion in 4th and 5th digits.     Ultrasound   Ultrasound Location forearm- volar, hand/digits volar   Ultrasound Parameters 30mz,  20%, 0.8w/cm2 558m forearm, 5 min hand   Ultrasound Goals Edema;Other (Comment)  to enhance range of motion   Manual Therapy   Joint Mobilization PIP joints 2-5 followed with PROM- flexion   Myofascial Release Between radius and ulna - mid shaft to increase supination/ decrease tension.                  OT Education - 01/27/16 1720    Education provided Yes   Education Details Use of warm water as prep to hand before stretching exercises, complete several times per day to maintain range of motion   Person(s) Educated Patient   Methods Explanation   Comprehension Verbalized understanding          OT Short Term Goals - 12/23/15 0929    OT SHORT TERM GOAL #1   Title Indepndent with HEP (due 12/25/15)   Time 4   Period Weeks   Status Achieved   OT SHORT TERM GOAL #2   Title Pt will report pain less than or equal to 4/10 with activity and exercises   Baseline 5/10 at rest   Time 4   Period Weeks   Status Not Met  pain up to 9/10   OT SHORT TERM GOAL #3   Title Pt will consistently be mod I with buttoning and zipping with A/E prn   Time 4   Period Weeks   Status Achieved   OT SHORT TERM GOAL #4   Title Pt will verbalize understanding with potential A/E needs and how to acquire   Time 4   Period Weeks   Status Achieved           OT Long Term Goals - 01/25/16 1705    OT LONG TERM GOAL #1   Title Pt will be I with upgraded HEP prn - due 03/14/16   Baseline met for putty exercises   Time 8   Period Weeks   Status On-going   OT LONG TERM GOAL #2   Title Pt will improve wrist extension to neutral or greater for functional tasks   Baseline -10*   Time 8   Period Weeks   Status On-going   OT LONG TERM GOAL #3   Title Pt will demo 50% or greater composite finger flex/ext for gross grasping/releasing   Baseline approx. 40%   Time 8   Period Weeks   Status On-going   OT LONG TERM GOAL #4   Title Pt to eat 80% or greater with Rt hand in private not public  setting   Baseline approx. 50%   Time 8   Period Weeks   Status On-going   OT LONG TERM GOAL #5   Title Patient will utilize right hand, as assist to left  75% time for meal preparation activities due 7/3  Baseline 25%   Time 8   Period Weeks   Status New   OT LONG TERM GOAL #6   Title Patient will utilize right hand to effectively cut with scissors (cut coupon from paper) with increased time (right hand dominant)   Baseline Unable - uses bilateral technique   Time 8   Period Weeks   Status New               Plan - 01/27/16 1721    Clinical Impression Statement Patient showing improved functional use of right hand despite significant range of motion limitations in wrist and digits.  Patient showing improvement in supination of forearm.     OT Frequency 2x / week   OT Duration 8 weeks   OT Treatment/Interventions Self-care/ADL training;Therapeutic exercise;Patient/family education;Ultrasound;Neuromuscular education;Manual Therapy;Splinting;Cryotherapy;Parrafin;DME and/or AE instruction;Compression bandaging;Therapeutic activities;Electrical Stimulation;Fluidtherapy;Scar mobilization;Moist Heat;Contrast Bath;Passive range of motion   Consulted and Agree with Plan of Care Patient      Patient will benefit from skilled therapeutic intervention in order to improve the following deficits and impairments:  Decreased coordination, Decreased range of motion, Impaired flexibility, Increased edema, Impaired sensation, Decreased activity tolerance, Decreased knowledge of precautions, Decreased scar mobility, Impaired UE functional use, Pain, Decreased mobility, Decreased strength  Visit Diagnosis: Stiffness of right wrist, not elsewhere classified  Muscle weakness (generalized)  Pain in right wrist  Pain in right hand  Stiffness of right elbow, not elsewhere classified  Stiffness of right hand, not elsewhere classified    Problem List Patient Active Problem List    Diagnosis Date Noted  . Body aches 11/11/2015  . Insomnia 10/30/2015  . Oral herpes 10/19/2015  . Trichomonas infection 06/08/2015  . Healthcare maintenance 06/08/2015  . Arthritis of spine 03/27/2015  . Fall due to wet surface 03/27/2015  . Dizziness 02/11/2015  . Edema 02/11/2015  . Lumbar spine pain 01/09/2015  . Herpetic lesion 02/07/2014  . Intertriginous candidiasis 02/07/2014  . Depression 07/19/2013  . Distal radius fracture, right 10/20/2012  . Hordeolum 10/03/2012  . Intermittent low back pain 03/30/2012  . Heavy smoker (more than 20 cigarettes per day) 03/30/2012  . Health maintenance examination 03/30/2012  . Hot flashes, menopausal 02/28/2012  . Anxiety 02/28/2012  . Restless leg syndrome 02/28/2012  . Metatarsalgia of both feet 02/28/2012    Mariah Milling, OTR/L 01/27/2016, 5:24 PM  [facility] [address] [place], [place], [address] Phone: [phone]   Fax:  [phone]  Name: [name] MRN: [id number] Date of Birth: [birth year]

## 2016-02-01 ENCOUNTER — Ambulatory Visit: Payer: Medicare Other | Admitting: Occupational Therapy

## 2016-02-03 ENCOUNTER — Ambulatory Visit: Payer: Medicare Other | Admitting: Occupational Therapy

## 2016-02-03 ENCOUNTER — Encounter: Payer: Self-pay | Admitting: Occupational Therapy

## 2016-02-03 DIAGNOSIS — M25631 Stiffness of right wrist, not elsewhere classified: Secondary | ICD-10-CM | POA: Diagnosis not present

## 2016-02-03 DIAGNOSIS — M25641 Stiffness of right hand, not elsewhere classified: Secondary | ICD-10-CM | POA: Diagnosis not present

## 2016-02-03 DIAGNOSIS — M25531 Pain in right wrist: Secondary | ICD-10-CM

## 2016-02-03 DIAGNOSIS — M79641 Pain in right hand: Secondary | ICD-10-CM | POA: Diagnosis not present

## 2016-02-03 DIAGNOSIS — M6281 Muscle weakness (generalized): Secondary | ICD-10-CM | POA: Diagnosis not present

## 2016-02-03 DIAGNOSIS — M25621 Stiffness of right elbow, not elsewhere classified: Secondary | ICD-10-CM

## 2016-02-03 NOTE — Therapy (Signed)
[facility] [address] [place], [place], [address] Phone: [phone]   Fax:  [phone]  Occupational Therapy Treatment  Patient Details  Name: [name] MRN: [id number] Date of Birth: Nov 08, 1955 Referring Provider: Dr. Iran Planas  Encounter Date: 02/03/2016      OT End of Session - 02/03/16 1659    Visit Number 12   Number of Visits 17   Date for OT Re-Evaluation 03/14/16   Authorization Type [employer] MCR - G code needed   Authorization - Visit Number 12   Authorization - Number of Visits 17   OT Start Time 8144   OT Stop Time 1530   OT Time Calculation (min) 42 min   Activity Tolerance Patient tolerated treatment well   Behavior During Therapy [facility] for tasks assessed/performed      Past Medical History  Diagnosis Date  . Anxiety 2009  . Arthritis 2000  . Fibroid 2003  . Hot flashes, menopausal 2012  . Allergy     seasonal  . RLS (restless legs syndrome)   . Wears glasses   . Depression   . Shortness of breath     with exertion denies    Past Surgical History  Procedure Laterality Date  . Neck surgery  2003    Cervical vertebroplasty   . Colonoscopy    . Open reduction internal fixation (orif) distal radial fracture Right 11/08/2012    Procedure: OPEN REDUCTION INTERNAL FIXATION (ORIF) RIGHT DISTAL RADIUS FRACTURE;  Surgeon: Linna Hoff, MD;  Location: [facility];  Service: Orthopedics;  Laterality: Right;  . Hardware removal Right 07/24/2013    Procedure: RIGHT WRIST HARDWARE REMOVAL, JOINT RELEASE, RIGHT HAND MANIPULATION UNDER ANESTHESIA;  Surgeon: Linna Hoff, MD;  Location: [facility];  Service: Orthopedics;  Laterality: Right;  . Wrist arthroscopy with debridement Right 07/21/2014    Procedure: RIGHT WRIST DISTAL RADIAL ULNAR JOINT DEBRIDEMENT AND JOINT RELEASE POSSIBLE TENDON INTERPOSITION;  Surgeon: Linna Hoff, MD;  Location: [facility];  Service:  Orthopedics;  Laterality: Right;  . Arthrotomy Right 11/09/2015    Procedure: RIGHT WRIST DISTAL RADIAL ULNAR JOINT ARTHROTOMY AND DEBRIDEMENT,  AND ;  Surgeon: Iran Planas, MD;  Location: [facility];  Service: Orthopedics;  Laterality: Right;  . Wrist arthroplasty Right 11/09/2015    Procedure: OR DISTAL ULNAR RESECTION ARTHROPLASTY ;  Surgeon: Iran Planas, MD;  Location: [facility];  Service: Orthopedics;  Laterality: Right;    There were no vitals filed for this visit.      Subjective Assessment - 02/03/16 1652    Subjective  I am good - but my hand was aching last night.    Pertinent History 11/09/15: Rt ulna osteoplasty with resection of 1 cm of distal ulnar shaft, Rt wrist stabalization and ECU tendon transfer and tenolysis due to Rt distal radius malunion. 07/21/14: Rt wrist arthoscopy and tendon releases. 11/08/12: ORIF Rt distal radius fx   Limitations ok for A/ROM and P/ROM at wrist, ok for strengthening to hand - NOW cleared for strengthening to wrist/forearm   Patient Stated Goals To get some mobility back in the wrist   Currently in Pain? Yes   Pain Score 4    Pain Location Wrist   Pain Orientation Right   Pain Descriptors / Indicators Aching   Pain Type Chronic pain   Pain Frequency Intermittent   Aggravating Factors  weather, use of right hand  [facility] OT Assessment - 02/03/16 0001    Right Hand AROM   R Index PIP 0-100 70 Degrees   R Long PIP 0-100 75 Degrees                  OT Treatments/Exercises (OP) - 02/03/16 0001    ADLs   Grooming Patient able to use her right hand to braid her hair at end of session today.  Patient has struggled with this in the recent past.     Elbow Exercises   Other elbow exercises Forearm supination.  Worked to establish interplay between hand, wrist and forearm via forearm gym exercise.  Patient with functional stiffness persisting.     Ultrasound   Ultrasound Location hand/digits   Ultrasound Parameters 38mz, 20%,  0.8w/cm2, 6 min   Ultrasound Goals Pain;Other (Comment)  range of motion   Manual Therapy   Manual therapy comments Joint mobilization PIP, followed with palce and hold exercise.  Working within patient's tolerance, patient is pushing herself to improve digit motion.     Myofascial Release Soft tissue mobilization / myofascial release between radius and ulna.  Patient has llimited supination due to structurla changes in wrist, however - proximal supination is significantly improved.  Place and hold exercise immediately following stretch.                  OT Education - 02/03/16 1658    Education provided Yes   Education Details Reviewed remaining visits in this plan of care.  Patient aware and in agreement to end therapy as indicated in POC.     Person(s) Educated Patient   Methods Explanation   Comprehension Verbalized understanding          OT Short Term Goals - 12/23/15 0929    OT SHORT TERM GOAL #1   Title Indepndent with HEP (due 12/25/15)   Time 4   Period Weeks   Status Achieved   OT SHORT TERM GOAL #2   Title Pt will report pain less than or equal to 4/10 with activity and exercises   Baseline 5/10 at rest   Time 4   Period Weeks   Status Not Met  pain up to 9/10   OT SHORT TERM GOAL #3   Title Pt will consistently be mod I with buttoning and zipping with A/E prn   Time 4   Period Weeks   Status Achieved   OT SHORT TERM GOAL #4   Title Pt will verbalize understanding with potential A/E needs and how to acquire   Time 4   Period Weeks   Status Achieved           OT Long Term Goals - 01/25/16 1705    OT LONG TERM GOAL #1   Title Pt will be I with upgraded HEP prn - due 03/14/16   Baseline met for putty exercises   Time 8   Period Weeks   Status On-going   OT LONG TERM GOAL #2   Title Pt will improve wrist extension to neutral or greater for functional tasks   Baseline -10*   Time 8   Period Weeks   Status On-going   OT LONG TERM GOAL #3    Title Pt will demo 50% or greater composite finger flex/ext for gross grasping/releasing   Baseline approx. 40%   Time 8   Period Weeks   Status On-going   OT LONG TERM GOAL #4   Title Pt to eat 80%  or greater with Rt hand in private not public setting   Baseline approx. 50%   Time 8   Period Weeks   Status On-going   OT LONG TERM GOAL #5   Title Patient will utilize right hand, as assist to left  75% time for meal preparation activities due 7/3   Baseline 25%   Time 8   Period Weeks   Status New   OT LONG TERM GOAL #6   Title Patient will utilize right hand to effectively cut with scissors (cut coupon from paper) with increased time (right hand dominant)   Baseline Unable - uses bilateral technique   Time 8   Period Weeks   Status New               Plan - 02/03/16 1659    Clinical Impression Statement Patient continues to show improved functional use of right hand due to improved range of motion - forearm and digits, and decreased pain.  Patient aware that she benefits from using her hand, even if movement is limited.    Rehab Potential Good   OT Frequency 2x / week   OT Duration 8 weeks   OT Treatment/Interventions Self-care/ADL training;Therapeutic exercise;Patient/family education;Ultrasound;Neuromuscular education;Manual Therapy;Splinting;Cryotherapy;Parrafin;DME and/or AE instruction;Compression bandaging;Therapeutic activities;Electrical Stimulation;Fluidtherapy;Scar mobilization;Moist Heat;Contrast Bath;Passive range of motion   Plan Place and hold exercise, may wrap hand for paraffin, or [place]   Consulted and Agree with Plan of Care Patient      Patient will benefit from skilled therapeutic intervention in order to improve the following deficits and impairments:  Decreased coordination, Decreased range of motion, Impaired flexibility, Increased edema, Impaired sensation, Decreased activity tolerance, Decreased knowledge of precautions, Decreased scar mobility,  Impaired UE functional use, Pain, Decreased mobility, Decreased strength  Visit Diagnosis: Stiffness of right wrist, not elsewhere classified  Muscle weakness (generalized)  Pain in right wrist  Pain in right hand  Stiffness of right elbow, not elsewhere classified  Stiffness of right hand, not elsewhere classified    Problem List Patient Active Problem List   Diagnosis Date Noted  . Body aches 11/11/2015  . Insomnia 10/30/2015  . Oral herpes 10/19/2015  . Trichomonas infection 06/08/2015  . Healthcare maintenance 06/08/2015  . Arthritis of spine 03/27/2015  . Fall due to wet surface 03/27/2015  . Dizziness 02/11/2015  . Edema 02/11/2015  . Lumbar spine pain 01/09/2015  . Herpetic lesion 02/07/2014  . Intertriginous candidiasis 02/07/2014  . Depression 07/19/2013  . Distal radius fracture, right 10/20/2012  . Hordeolum 10/03/2012  . Intermittent low back pain 03/30/2012  . Heavy smoker (more than 20 cigarettes per day) 03/30/2012  . Health maintenance examination 03/30/2012  . Hot flashes, menopausal 02/28/2012  . Anxiety 02/28/2012  . Restless leg syndrome 02/28/2012  . Metatarsalgia of both feet 02/28/2012    [name], OTR/L 02/03/2016, 5:04 PM  [facility] [address] [place], [place], [address] Phone: [phone]   Fax:  [phone]  Name: [name] MRN: [id number] Date of Birth: 10/10/1955

## 2016-02-04 ENCOUNTER — Encounter: Payer: Medicaid Other | Admitting: Occupational Therapy

## 2016-02-05 ENCOUNTER — Ambulatory Visit: Payer: Medicare Other | Admitting: Occupational Therapy

## 2016-02-05 ENCOUNTER — Encounter: Payer: Self-pay | Admitting: Occupational Therapy

## 2016-02-05 DIAGNOSIS — M25631 Stiffness of right wrist, not elsewhere classified: Secondary | ICD-10-CM | POA: Diagnosis not present

## 2016-02-05 DIAGNOSIS — M6281 Muscle weakness (generalized): Secondary | ICD-10-CM | POA: Diagnosis not present

## 2016-02-05 DIAGNOSIS — M25531 Pain in right wrist: Secondary | ICD-10-CM | POA: Diagnosis not present

## 2016-02-05 DIAGNOSIS — M25641 Stiffness of right hand, not elsewhere classified: Secondary | ICD-10-CM | POA: Diagnosis not present

## 2016-02-05 DIAGNOSIS — M79641 Pain in right hand: Secondary | ICD-10-CM | POA: Diagnosis not present

## 2016-02-05 DIAGNOSIS — M25621 Stiffness of right elbow, not elsewhere classified: Secondary | ICD-10-CM | POA: Diagnosis not present

## 2016-02-05 NOTE — Therapy (Signed)
[facility] [address] [place], [place], [address] Phone: [phone]   Fax:  [phone]  Occupational Therapy Note  Patient Details  Name: [name] MRN: [id number] Date of Birth: 10/24/1955 Referring Provider: Dr. Fred Ortmann  Encounter Date: 02/05/2016- Patient cancelled this and all remaining sessions due to no longer needing therapy services.      Past Medical History  Diagnosis Date  . Anxiety 2009  . Arthritis 2000  . Fibroid 2003  . Hot flashes, menopausal 2012  . Allergy     seasonal  . RLS (restless legs syndrome)   . Wears glasses   . Depression   . Shortness of breath     with exertion denies    Past Surgical History  Procedure Laterality Date  . Neck surgery  2003    Cervical vertebroplasty   . Colonoscopy    . Open reduction internal fixation (orif) distal radial fracture Right 11/08/2012    Procedure: OPEN REDUCTION INTERNAL FIXATION (ORIF) RIGHT DISTAL RADIUS FRACTURE;  Surgeon: Fred W Ortmann, MD;  Location: [facility];  Service: Orthopedics;  Laterality: Right;  . Hardware removal Right 07/24/2013    Procedure: RIGHT WRIST HARDWARE REMOVAL, JOINT RELEASE, RIGHT HAND MANIPULATION UNDER ANESTHESIA;  Surgeon: Fred W Ortmann, MD;  Location: [facility];  Service: Orthopedics;  Laterality: Right;  . Wrist arthroscopy with debridement Right 07/21/2014    Procedure: RIGHT WRIST DISTAL RADIAL ULNAR JOINT DEBRIDEMENT AND JOINT RELEASE POSSIBLE TENDON INTERPOSITION;  Surgeon: Fred W Ortmann, MD;  [facility];  Service: Orthopedics;  Laterality: Right;  . Arthrotomy Right 11/09/2015    Procedure: RIGHT WRIST DISTAL RADIAL ULNAR JOINT ARTHROTOMY AND DEBRIDEMENT,  AND ;  Surgeon: Fred Ortmann, MD;  [facility];  Service: Orthopedics;  Laterality: Right;  . Wrist arthroplasty Right 11/09/2015    Procedure: OR DISTAL ULNAR RESECTION ARTHROPLASTY ;  Surgeon: Fred  Ortmann, MD;  [facility];  Service: Orthopedics;  Laterality: Right;    There were no vitals filed for this visit.                              OT Short Term Goals - 02/05/16 1351    OT SHORT TERM GOAL #1   Title Indepndent with HEP (due 12/25/15)   Status Achieved   OT SHORT TERM GOAL #2   Title Pt will report pain less than or equal to 4/10 with activity and exercises   Status Not Met   OT SHORT TERM GOAL #3   Title Pt will consistently be mod I with buttoning and zipping with A/E prn   Status Achieved   OT SHORT TERM GOAL #4   Title Pt will verbalize understanding with potential A/E needs and how to acquire   Status Achieved           OT Long Term Goals - 02/05/16 1352    OT LONG TERM GOAL #1   Title Pt will be I with upgraded HEP prn - due 03/14/16   Status Achieved   OT LONG TERM GOAL #2   Title Pt will improve wrist extension to neutral or greater for functional tasks   Status Achieved   OT LONG TERM GOAL #3   Title Pt will demo 50% or greater composite finger flex/ext for gross grasping/releasing   Status Achieved   OT LONG TERM GOAL #4   Title Pt   to eat 80% or greater with Rt hand in private not public setting   Status Achieved   OT LONG TERM GOAL #5   Title Patient will utilize right hand, as assist to left  75% time for meal preparation activities due 7/3   Status Achieved   OT LONG TERM GOAL #6   Title Patient will utilize right hand to effectively cut with scissors (cut coupon from paper) with increased time (right hand dominant)   Status Unable to assess                 G-Codes - 02/05/16 1354    Functional Assessment Tool Used 9 hole peg right 30.22, left 24.82, gross grasp- right 15 lbs, left 45 lbs.  Skilled clinical judgement- severe limitations to active and passive motion of forearm, wrist and digits right upper extremity   Functional Limitation Carrying, moving and handling objects   Carrying, Moving and  Handling Objects Current Status (G8984) At least 40 percent but less than 60 percent impaired, limited or restricted   Carrying, Moving and Handling Objects Goal Status (G8985) At least 40 percent but less than 60 percent impaired, limited or restricted   Carrying, Moving and Handling Objects Discharge Status (G8986) At least 40 percent but less than 60 percent impaired, limited or restricted      Problem List Patient Active Problem List   Diagnosis Date Noted  . Body aches 11/11/2015  . Insomnia 10/30/2015  . Oral herpes 10/19/2015  . Trichomonas infection 06/08/2015  . Healthcare maintenance 06/08/2015  . Arthritis of spine 03/27/2015  . Fall due to wet surface 03/27/2015  . Dizziness 02/11/2015  . Edema 02/11/2015  . Lumbar spine pain 01/09/2015  . Herpetic lesion 02/07/2014  . Intertriginous candidiasis 02/07/2014  . Depression 07/19/2013  . Distal radius fracture, right 10/20/2012  . Hordeolum 10/03/2012  . Intermittent low back pain 03/30/2012  . Heavy smoker (more than 20 cigarettes per day) 03/30/2012  . Health maintenance examination 03/30/2012  . Hot flashes, menopausal 02/28/2012  . Anxiety 02/28/2012  . Restless leg syndrome 02/28/2012  . Metatarsalgia of both feet 02/28/2012   OCCUPATIONAL THERAPY DISCHARGE SUMMARY  Visits from Start of Care: 12  Current functional level related to goals / functional outcomes: Patient made marked improvement in passive and active range of motion in forearm for supination, and in digits for improved flexion, specifically MCP/PIP.     Remaining deficits: Limited wrist extension beyond neutral, limited finger flexion for in hand manipulation, fine motor coordination.  Pain and arthritis throughout right UE.   Education / Equipment: HEP, FLexion glove, use of right hand for function - adaptive equipment options   Plan: Patient agrees to discharge.  Patient goals were partially met. Patient is being discharged due to meeting the  stated rehab goals.  ?????        Gellert, Kristin M, OTR/L 02/05/2016, 1:56 PM  [facility] [address] [place], [place], [address] Phone: [phone]   Fax:  [phone]  Name: [name] MRN: [id number] Date of Birth: 10/03/1955  

## 2016-02-10 ENCOUNTER — Encounter: Payer: Medicaid Other | Admitting: Occupational Therapy

## 2016-02-12 ENCOUNTER — Encounter: Payer: Medicaid Other | Admitting: Occupational Therapy

## 2016-02-12 DIAGNOSIS — S52561D Barton's fracture of right radius, subsequent encounter for closed fracture with routine healing: Secondary | ICD-10-CM | POA: Diagnosis not present

## 2016-02-16 ENCOUNTER — Encounter: Payer: Medicaid Other | Admitting: Occupational Therapy

## 2016-02-17 ENCOUNTER — Encounter: Payer: Medicaid Other | Admitting: Occupational Therapy

## 2016-02-19 ENCOUNTER — Encounter: Payer: Medicaid Other | Admitting: Occupational Therapy

## 2016-03-02 ENCOUNTER — Encounter: Payer: Self-pay | Admitting: Family Medicine

## 2016-03-02 ENCOUNTER — Ambulatory Visit (INDEPENDENT_AMBULATORY_CARE_PROVIDER_SITE_OTHER): Payer: Medicare Other | Admitting: Family Medicine

## 2016-03-02 VITALS — BP 125/75 | HR 76 | Temp 97.6°F | Ht 66.0 in | Wt 169.0 lb

## 2016-03-02 DIAGNOSIS — G2581 Restless legs syndrome: Secondary | ICD-10-CM

## 2016-03-02 DIAGNOSIS — H5713 Ocular pain, bilateral: Secondary | ICD-10-CM | POA: Diagnosis not present

## 2016-03-02 DIAGNOSIS — H5710 Ocular pain, unspecified eye: Secondary | ICD-10-CM | POA: Insufficient documentation

## 2016-03-02 MED ORDER — LORATADINE 10 MG PO TABS: 10.0000 mg | ORAL_TABLET | Freq: Every day | ORAL | Status: DC

## 2016-03-02 MED ORDER — GABAPENTIN 300 MG PO CAPS: 300.0000 mg | ORAL_CAPSULE | Freq: Three times a day (TID) | ORAL | Status: DC

## 2016-03-02 NOTE — Assessment & Plan Note (Signed)
Bilateral lower extremity pain occurring mainly at night, she denies urges to move legs so question if this is RLS. She reports pain well controlled with gabapentin - Refilled Gabapentin - recommended f/u with PCP for ongoing evaluation and treatment as indicated.

## 2016-03-02 NOTE — Assessment & Plan Note (Signed)
Vague, intermittent eye pain/pressure.  Patient is a poor historian and unable to answer many specific questions about her symptoms.  Physical exam benign, will refer to ophthalmology for evaluation of glaucoma and internal eye pathologies - Advised patient to keep a diary recording her pain and associated symptoms - Follow-up with PCP in 2-3 weeks for reevaluation

## 2016-03-02 NOTE — Progress Notes (Signed)
  Patient name: [name] MRN [id number]  Date of birth: 06-22-1956  CC & HPI:  [name] is a 60 y.o. female presenting today for Eye pain and restless legs.   Eye pain - She reports several months of intermittent eye pain/pressure - Pain occurs and both eyes, but sometimes only one eye hurts at any given time - Pain can last for minutes to sometimes days - Worse with bending over - She has tried Tylenol with no relief - Reports some nasal congestion, has not been using her allergy medications - Denies any fevers, chills, recent infections, double vision, blurry vision - Denies previous heart attacks, strokes - Denies family history of glaucoma - Reports history of astigmatism - She denies current pain  Restless legs - She reports pain in bilateral legs that occurs intermittently, mainly at night - She reports this is been well controlled with gabapentin  Smoking History Noted  Objective Findings:  Vitals: BP 125/75 mmHg  Pulse 76  Temp(Src) 97.6 F (36.4 C) (Oral)  Ht '5\' 6"'$  (1.676 m)  Wt 169 lb (76.658 kg)  BMI 27.29 kg/m2  Gen: NAD Eyes: PERRLA, EOMI, sclera clear, globes nontender to palpation bilaterally ENT: Mild tenderness to palpation and percussion of frontal sinuses CV: RRR w/o m/r/g, pulses +2 b/l; no carotid bruits Resp: CTAB w/ normal respiratory effort LE: No edema  Assessment & Plan:   Eye pain Vague, intermittent eye pain/pressure.  Patient is a poor historian and unable to answer many specific questions about her symptoms.  Physical exam benign, will refer to ophthalmology for evaluation of glaucoma and internal eye pathologies - Advised patient to keep a diary recording her pain and associated symptoms - Follow-up with PCP in 2-3 weeks for reevaluation  Restless leg syndrome Bilateral lower extremity pain occurring mainly at night, she denies urges to move legs so question if this is RLS. She reports pain well controlled with gabapentin -  Refilled Gabapentin - recommended f/u with PCP for ongoing evaluation and treatment as indicated.

## 2016-03-02 NOTE — Patient Instructions (Signed)
I have referred you to ophthalmology for further evaluation of your eye pain.  - In the meantime, keep a diary of the pain and associated symptoms. When it happens, how it feels, anything that causes the pain or makes it works, how long it last - Restart Claritin daily, as your allergies may be causing your eye pain  Follow-up with Dr. Lincoln Brigham for your hot flashes and anxiety

## 2016-03-11 DIAGNOSIS — H04123 Dry eye syndrome of bilateral lacrimal glands: Secondary | ICD-10-CM | POA: Diagnosis not present

## 2016-04-08 DIAGNOSIS — S52561D Barton's fracture of right radius, subsequent encounter for closed fracture with routine healing: Secondary | ICD-10-CM | POA: Diagnosis not present

## 2016-04-08 DIAGNOSIS — Z4789 Encounter for other orthopedic aftercare: Secondary | ICD-10-CM | POA: Diagnosis not present

## 2016-04-12 ENCOUNTER — Ambulatory Visit (INDEPENDENT_AMBULATORY_CARE_PROVIDER_SITE_OTHER): Payer: Medicare Other | Admitting: *Deleted

## 2016-04-12 ENCOUNTER — Encounter: Payer: Self-pay | Admitting: *Deleted

## 2016-04-12 VITALS — BP 135/73 | HR 67 | Temp 98.0°F | Ht 67.0 in | Wt 168.2 lb

## 2016-04-12 DIAGNOSIS — M47819 Spondylosis without myelopathy or radiculopathy, site unspecified: Secondary | ICD-10-CM

## 2016-04-12 DIAGNOSIS — Z0000 Encounter for general adult medical examination without abnormal findings: Secondary | ICD-10-CM | POA: Diagnosis not present

## 2016-04-12 NOTE — Patient Instructions (Signed)
Fat and Cholesterol Restricted Diet Getting too much fat and cholesterol in your diet may cause health problems. Following this diet helps keep your fat and cholesterol at normal levels. This can keep you from getting sick. WHAT TYPES OF FAT SHOULD I CHOOSE?  Choose monosaturated and polyunsaturated fats. These are found in foods such as olive oil, canola oil, flaxseeds, walnuts, almonds, and seeds.  Eat more omega-3 fats. Good choices include salmon, mackerel, sardines, tuna, flaxseed oil, and ground flaxseeds.  Limit saturated fats. These are in animal products such as meats, butter, and cream. They can also be in plant products such as palm oil, palm kernel oil, and coconut oil.   Avoid foods with partially hydrogenated oils in them. These contain trans fats. Examples of foods that have trans fats are stick margarine, some tub margarines, cookies, crackers, and other baked goods. WHAT GENERAL GUIDELINES DO I NEED TO FOLLOW?   Check food labels. Look for the words "trans fat" and "saturated fat."  When preparing a meal:  Fill half of your plate with vegetables and green salads.  Fill one fourth of your plate with whole grains. Look for the word "whole" as the first word in the ingredient list.  Fill one fourth of your plate with lean protein foods.  Limit fruit to two servings a day. Choose fruit instead of juice.  Eat more foods with soluble fiber. Examples of foods with this type of fiber are apples, broccoli, carrots, beans, peas, and barley. Try to get 20-30 g (grams) of fiber per day.  Eat more home-cooked foods. Eat less at restaurants and buffets.  Limit or avoid alcohol.  Limit foods high in starch and sugar.  Limit fried foods.  Cook foods without frying them. Baking, boiling, grilling, and broiling are all great options.  Lose weight if you are overweight. Losing even a small amount of weight can help your overall health. It can also help prevent diseases such as  diabetes and heart disease. WHAT FOODS CAN I EAT? Grains Whole grains, such as whole wheat or whole grain breads, crackers, cereals, and pasta. Unsweetened oatmeal, bulgur, barley, quinoa, or brown rice. Corn or whole wheat flour tortillas. Vegetables Fresh or frozen vegetables (raw, steamed, roasted, or grilled). Green salads. Fruits All fresh, canned (in natural juice), or frozen fruits. Meat and Other Protein Products Ground beef (85% or leaner), grass-fed beef, or beef trimmed of fat. Skinless chicken or [place]. Ground chicken or [place]. Pork trimmed of fat. All fish and seafood. Eggs. Dried beans, peas, or lentils. Unsalted nuts or seeds. Unsalted canned or dry beans. Dairy Low-fat dairy products, such as skim or 1% milk, 2% or reduced-fat cheeses, low-fat ricotta or cottage cheese, or plain low-fat yogurt. Fats and Oils Tub margarines without trans fats. Light or reduced-fat mayonnaise and salad dressings. Avocado. Olive, canola, sesame, or safflower oils. Natural peanut or almond butter (choose ones without added sugar and oil). The items listed above may not be a complete list of recommended foods or beverages. Contact your dietitian for more options. WHAT FOODS ARE NOT RECOMMENDED? Grains White bread. White pasta. White rice. Cornbread. Bagels, pastries, and croissants. Crackers that contain trans fat. Vegetables White potatoes. Corn. Creamed or fried vegetables. Vegetables in a cheese sauce. Fruits Dried fruits. Canned fruit in light or heavy syrup. Fruit juice. Meat and Other Protein Products Fatty cuts of meat. Ribs, chicken wings, bacon, sausage, bologna, salami, chitterlings, fatback, hot dogs, bratwurst, and packaged luncheon meats. Liver and organ meats.  Dairy Whole or 2% milk, cream, half-and-half, and cream cheese. Whole milk cheeses. Whole-fat or sweetened yogurt. Full-fat cheeses. Nondairy creamers and whipped toppings. Processed cheese, cheese spreads, or cheese  curds. Sweets and Desserts Corn syrup, sugars, honey, and molasses. Candy. Jam and jelly. Syrup. Sweetened cereals. Cookies, pies, cakes, donuts, muffins, and ice cream. Fats and Oils Butter, stick margarine, lard, shortening, ghee, or bacon fat. Coconut, palm kernel, or palm oils. Beverages Alcohol. Sweetened drinks (such as sodas, lemonade, and fruit drinks or punches). The items listed above may not be a complete list of foods and beverages to avoid. Contact your dietitian for more information.   This information is not intended to replace advice given to you by your health care provider. Make sure you discuss any questions you have with your health care provider.   Document Released: 02/28/2012 Document Revised: 09/19/2014 Document Reviewed: 11/28/2013 Elsevier Interactive Patient Education 2016 [place] in the Home  Falls can cause injuries. They can happen to people of all ages. There are many things you can do to make your home safe and to help prevent falls.  WHAT CAN I DO ON THE OUTSIDE OF MY HOME?  Regularly fix the edges of walkways and driveways and fix any cracks.  Remove anything that might make you trip as you walk through a door, such as a raised step or threshold.  Trim any bushes or trees on the path to your home.  Use bright outdoor lighting.  Clear any walking paths of anything that might make someone trip, such as rocks or tools.  Regularly check to see if handrails are loose or broken. Make sure that both sides of any steps have handrails.  Any raised decks and porches should have guardrails on the edges.  Have any leaves, snow, or ice cleared regularly.  Use sand or salt on walking paths during winter.  Clean up any spills in your garage right away. This includes oil or grease spills. WHAT CAN I DO IN THE BATHROOM?   Use night lights.  Install grab bars by the toilet and in the tub and shower. Do not use towel bars as grab  bars.  Use non-skid mats or decals in the tub or shower.  If you need to sit down in the shower, use a plastic, non-slip stool.  Keep the floor dry. Clean up any water that spills on the floor as soon as it happens.  Remove soap buildup in the tub or shower regularly.  Attach bath mats securely with double-sided non-slip rug tape.  Do not have throw rugs and other things on the floor that can make you trip. WHAT CAN I DO IN THE BEDROOM?  Use night lights.  Make sure that you have a light by your bed that is easy to reach.  Do not use any sheets or blankets that are too big for your bed. They should not hang down onto the floor.  Have a firm chair that has side arms. You can use this for support while you get dressed.  Do not have throw rugs and other things on the floor that can make you trip. WHAT CAN I DO IN THE KITCHEN?  Clean up any spills right away.  Avoid walking on wet floors.  Keep items that you use a lot in easy-to-reach places.  If you need to reach something above you, use a strong step stool that has a grab bar.  Keep electrical cords out of  the way.  Do not use floor polish or wax that makes floors slippery. If you must use wax, use non-skid floor wax.  Do not have throw rugs and other things on the floor that can make you trip. WHAT CAN I DO WITH MY STAIRS?  Do not leave any items on the stairs.  Make sure that there are handrails on both sides of the stairs and use them. Fix handrails that are broken or loose. Make sure that handrails are as long as the stairways.  Check any carpeting to make sure that it is firmly attached to the stairs. Fix any carpet that is loose or worn.  Avoid having throw rugs at the top or bottom of the stairs. If you do have throw rugs, attach them to the floor with carpet tape.  Make sure that you have a light switch at the top of the stairs and the bottom of the stairs. If you do not have them, ask someone to add them for  you. WHAT ELSE CAN I DO TO HELP PREVENT FALLS?  Wear shoes that:  Do not have high heels.  Have rubber bottoms.  Are comfortable and fit you well.  Are closed at the toe. Do not wear sandals.  If you use a stepladder:  Make sure that it is fully opened. Do not climb a closed stepladder.  Make sure that both sides of the stepladder are locked into place.  Ask someone to hold it for you, if possible.  Clearly mark and make sure that you can see:  Any grab bars or handrails.  First and last steps.  Where the edge of each step is.  Use tools that help you move around (mobility aids) if they are needed. These include:  Canes.  Walkers.  Scooters.  Crutches.  Turn on the lights when you go into a dark area. Replace any light bulbs as soon as they burn out.  Set up your furniture so you have a clear path. Avoid moving your furniture around.  If any of your floors are uneven, fix them.  If there are any pets around you, be aware of where they are.  Review your medicines with your doctor. Some medicines can make you feel dizzy. This can increase your chance of falling. Ask your doctor what other things that you can do to help prevent falls.   This information is not intended to replace advice given to you by your health care provider. Make sure you discuss any questions you have with your health care provider.   Document Released: 06/25/2009 Document Revised: 01/13/2015 Document Reviewed: 10/03/2014 Elsevier Interactive Patient Education 2016 [place] Maintenance, Female Adopting a healthy lifestyle and getting preventive care can go a long way to promote health and wellness. Talk with your health care provider about what schedule of regular examinations is right for you. This is a good chance for you to check in with your provider about disease prevention and staying healthy. In between checkups, there are plenty of things you can do on your own. Experts  have done a lot of research about which lifestyle changes and preventive measures are most likely to keep you healthy. Ask your health care provider for more information. WEIGHT AND DIET  Eat a healthy diet  Be sure to include plenty of vegetables, fruits, low-fat dairy products, and lean protein.  Do not eat a lot of foods high in solid fats, added sugars, or salt.  Get regular exercise.  This is one of the most important things you can do for your health.  Most adults should exercise for at least 150 minutes each week. The exercise should increase your heart rate and make you sweat (moderate-intensity exercise).  Most adults should also do strengthening exercises at least twice a week. This is in addition to the moderate-intensity exercise.  Maintain a healthy weight  Body mass index (BMI) is a measurement that can be used to identify possible weight problems. It estimates body fat based on height and weight. Your health care provider can help determine your BMI and help you achieve or maintain a healthy weight.  For females 62 years of age and older:   A BMI below 18.5 is considered underweight.  A BMI of 18.5 to 24.9 is normal.  A BMI of 25 to 29.9 is considered overweight.  A BMI of 30 and above is considered obese.  Watch levels of cholesterol and blood lipids  You should start having your blood tested for lipids and cholesterol at 61 years of age, then have this test every 5 years.  You may need to have your cholesterol levels checked more often if:  Your lipid or cholesterol levels are high.  You are older than 61 years of age.  You are at high risk for heart disease.  CANCER SCREENING   Lung Cancer  Lung cancer screening is recommended for adults 46-3 years old who are at high risk for lung cancer because of a history of smoking.  A yearly low-dose CT scan of the lungs is recommended for people who:  Currently smoke.  Have quit within the past 15  years.  Have at least a 30-pack-year history of smoking. A pack year is smoking an average of one pack of cigarettes a day for 1 year.  Yearly screening should continue until it has been 15 years since you quit.  Yearly screening should stop if you develop a health problem that would prevent you from having lung cancer treatment.  Breast Cancer  Practice breast self-awareness. This means understanding how your breasts normally appear and feel.  It also means doing regular breast self-exams. Let your health care provider know about any changes, no matter how small.  If you are in your 20s or 30s, you should have a clinical breast exam (CBE) by a health care provider every 1-3 years as part of a regular health exam.  If you are 19 or older, have a CBE every year. Also consider having a breast X-ray (mammogram) every year.  If you have a family history of breast cancer, talk to your health care provider about genetic screening.  If you are at high risk for breast cancer, talk to your health care provider about having an MRI and a mammogram every year.  Breast cancer gene (BRCA) assessment is recommended for women who have family members with BRCA-related cancers. BRCA-related cancers include:  Breast.  Ovarian.  Tubal.  Peritoneal cancers.  Results of the assessment will determine the need for genetic counseling and BRCA1 and BRCA2 testing. Cervical Cancer Your health care provider may recommend that you be screened regularly for cancer of the pelvic organs (ovaries, uterus, and vagina). This screening involves a pelvic examination, including checking for microscopic changes to the surface of your cervix (Pap test). You may be encouraged to have this screening done every 3 years, beginning at age 57.  For women ages 47-65, health care providers may recommend pelvic exams and Pap testing every  3 years, or they may recommend the Pap and pelvic exam, combined with testing for human  papilloma virus (HPV), every 5 years. Some types of HPV increase your risk of cervical cancer. Testing for HPV may also be done on women of any age with unclear Pap test results.  Other health care providers may not recommend any screening for nonpregnant women who are considered low risk for pelvic cancer and who do not have symptoms. Ask your health care provider if a screening pelvic exam is right for you.  If you have had past treatment for cervical cancer or a condition that could lead to cancer, you need Pap tests and screening for cancer for at least 20 years after your treatment. If Pap tests have been discontinued, your risk factors (such as having a new sexual partner) need to be reassessed to determine if screening should resume. Some women have medical problems that increase the chance of getting cervical cancer. In these cases, your health care provider may recommend more frequent screening and Pap tests. Colorectal Cancer  This type of cancer can be detected and often prevented.  Routine colorectal cancer screening usually begins at 61 years of age and continues through 61 years of age.  Your health care provider may recommend screening at an earlier age if you have risk factors for colon cancer.  Your health care provider may also recommend using home test kits to check for hidden blood in the stool.  A small camera at the end of a tube can be used to examine your colon directly (sigmoidoscopy or colonoscopy). This is done to check for the earliest forms of colorectal cancer.  Routine screening usually begins at age 23.  Direct examination of the colon should be repeated every 5-10 years through 60 years of age. However, you may need to be screened more often if early forms of precancerous polyps or small growths are found. Skin Cancer  Check your skin from head to toe regularly.  Tell your health care provider about any new moles or changes in moles, especially if there is a  change in a mole's shape or color.  Also tell your health care provider if you have a mole that is larger than the size of a pencil eraser.  Always use sunscreen. Apply sunscreen liberally and repeatedly throughout the day.  Protect yourself by wearing long sleeves, pants, a wide-brimmed hat, and sunglasses whenever you are outside. HEART DISEASE, DIABETES, AND HIGH BLOOD PRESSURE   High blood pressure causes heart disease and increases the risk of stroke. High blood pressure is more likely to develop in:  People who have blood pressure in the high end of the normal range (130-139/85-89 mm Hg).  People who are overweight or obese.  People who are African American.  If you are 71-65 years of age, have your blood pressure checked every 3-5 years. If you are 69 years of age or older, have your blood pressure checked every year. You should have your blood pressure measured twice--once when you are at a hospital or clinic, and once when you are not at a hospital or clinic. Record the average of the two measurements. To check your blood pressure when you are not at a hospital or clinic, you can use:  An automated blood pressure machine at a pharmacy.  A home blood pressure monitor.  If you are between 68 years and 84 years old, ask your health care provider if you should take aspirin to prevent strokes.  Have regular diabetes screenings. This involves taking a blood sample to check your fasting blood sugar level.  If you are at a normal weight and have a low risk for diabetes, have this test once every three years after 61 years of age.  If you are overweight and have a high risk for diabetes, consider being tested at a younger age or more often. PREVENTING INFECTION  Hepatitis B  If you have a higher risk for hepatitis B, you should be screened for this virus. You are considered at high risk for hepatitis B if:  You were born in a country where hepatitis B is common. Ask your health  care provider which countries are considered high risk.  Your parents were born in a high-risk country, and you have not been immunized against hepatitis B (hepatitis B vaccine).  You have HIV or AIDS.  You use needles to inject street drugs.  You live with someone who has hepatitis B.  You have had sex with someone who has hepatitis B.  You get hemodialysis treatment.  You take certain medicines for conditions, including cancer, organ transplantation, and autoimmune conditions. Hepatitis C  Blood testing is recommended for:  Everyone born from [birth year] through 1966.  Anyone with known risk factors for hepatitis C. Sexually transmitted infections (STIs)  You should be screened for sexually transmitted infections (STIs) including gonorrhea and chlamydia if:  You are sexually active and are younger than 61 years of age.  You are older than 61 years of age and your health care provider tells you that you are at risk for this type of infection.  Your sexual activity has changed since you were last screened and you are at an increased risk for chlamydia or gonorrhea. Ask your health care provider if you are at risk.  If you do not have HIV, but are at risk, it may be recommended that you take a prescription medicine daily to prevent HIV infection. This is called pre-exposure prophylaxis (PrEP). You are considered at risk if:  You are sexually active and do not regularly use condoms or know the HIV status of your partner(s).  You take drugs by injection.  You are sexually active with a partner who has HIV. Talk with your health care provider about whether you are at high risk of being infected with HIV. If you choose to begin PrEP, you should first be tested for HIV. You should then be tested every 3 months for as long as you are taking PrEP.  PREGNANCY   If you are premenopausal and you may become pregnant, ask your health care provider about preconception counseling.  If you may  become pregnant, take 400 to 800 micrograms (mcg) of folic acid every day.  If you want to prevent pregnancy, talk to your health care provider about birth control (contraception). OSTEOPOROSIS AND MENOPAUSE   Osteoporosis is a disease in which the bones lose minerals and strength with aging. This can result in serious bone fractures. Your risk for osteoporosis can be identified using a bone density scan.  If you are 31 years of age or older, or if you are at risk for osteoporosis and fractures, ask your health care provider if you should be screened.  Ask your health care provider whether you should take a calcium or vitamin D supplement to lower your risk for osteoporosis.  Menopause may have certain physical symptoms and risks.  Hormone replacement therapy may reduce some of these symptoms and risks. Talk to  your health care provider about whether hormone replacement therapy is right for you.  HOME CARE INSTRUCTIONS   Schedule regular health, dental, and eye exams.  Stay current with your immunizations.   Do not use any tobacco products including cigarettes, chewing tobacco, or electronic cigarettes.  If you are pregnant, do not drink alcohol.  If you are breastfeeding, limit how much and how often you drink alcohol.  Limit alcohol intake to no more than 1 drink per day for nonpregnant women. One drink equals 12 ounces of beer, 5 ounces of wine, or 1 ounces of hard liquor.  Do not use street drugs.  Do not share needles.  Ask your health care provider for help if you need support or information about quitting drugs.  Tell your health care provider if you often feel depressed.  Tell your health care provider if you have ever been abused or do not feel safe at home.   This information is not intended to replace advice given to you by your health care provider. Make sure you discuss any questions you have with your health care provider.   Document Released: 03/14/2011  Document Revised: 09/19/2014 Document Reviewed: 07/31/2013 Elsevier Interactive Patient Education 2016 [place] Can Quit Smoking If you are ready to quit smoking or are thinking about it, congratulations! You have chosen to help yourself be healthier and live longer! There are lots of different ways to quit smoking. Nicotine gum, nicotine patches, a nicotine inhaler, or nicotine nasal spray can help with physical craving. Hypnosis, support groups, and medicines help break the habit of smoking. TIPS TO GET OFF AND STAY OFF CIGARETTES  Learn to predict your moods. Do not let a bad situation be your excuse to have a cigarette. Some situations in your life might tempt you to have a cigarette.  Ask friends and co-workers not to smoke around you.  Make your home smoke-free.  Never have "just one" cigarette. It leads to wanting another and another. Remind yourself of your decision to quit.  On a card, make a list of your reasons for not smoking. Read it at least the same number of times a day as you have a cigarette. Tell yourself everyday, "I do not want to smoke. I choose not to smoke."  Ask someone at home or work to help you with your plan to quit smoking.  Have something planned after you eat or have a cup of coffee. Take a walk or get other exercise to perk you up. This will help to keep you from overeating.  Try a relaxation exercise to calm you down and decrease your stress. Remember, you may be tense and nervous the first two weeks after you quit. This will pass.  Find new activities to keep your hands busy. Play with a pen, coin, or rubber band. Doodle or draw things on paper.  Brush your teeth right after eating. This will help cut down the craving for the taste of tobacco after meals. You can try mouthwash too.  Try gum, breath mints, or diet candy to keep something in your mouth. IF YOU SMOKE AND WANT TO QUIT:  Do not stock up on cigarettes. Never buy a carton. Wait  until one pack is finished before you buy another.  Never carry cigarettes with you at work or at home.  Keep cigarettes as far away from you as possible. Leave them with someone else.  Never carry matches or a lighter with you.  Ask  yourself, "Do I need this cigarette or is this just a reflex?"  Bet with someone that you can quit. Put cigarette money in a piggy bank every morning. If you smoke, you give up the money. If you do not smoke, by the end of the week, you keep the money.  Keep trying. It takes 21 days to change a habit!  Talk to your doctor about using medicines to help you quit. These include nicotine replacement gum, lozenges, or skin patches.   This information is not intended to replace advice given to you by your health care provider. Make sure you discuss any questions you have with your health care provider.   Document Released: 06/25/2009 Document Revised: 11/21/2011 Document Reviewed: 06/25/2009 Elsevier Interactive Patient Education [employer].

## 2016-04-12 NOTE — Progress Notes (Signed)
Subjective:   [name] is a 60 y.o. female who presents for an Initial Medicare Annual Wellness Visit.   Cardiac Risk Factors include: smoking/ tobacco exposure     Objective:    Today's Vitals   04/12/16 1451  BP: 135/73  Pulse: 67  Temp: 98 F (36.7 C)  SpO2: 100%  Weight: 168 lb 3.2 oz (76.3 kg)  Height: '5\' 7"'$  (1.702 m)  PainSc: 0-No pain   Body mass index is 26.34 kg/m.   Current Medications (verified) Outpatient Encounter Prescriptions as of 04/12/2016  Medication Sig  . acetaminophen (TYLENOL) 650 MG CR tablet Take 650 mg by mouth every 8 (eight) hours as needed for pain.  . carbamide peroxide (DEBROX) 6.5 % otic solution Place 5 drops into both ears 2 (two) times daily.  . citalopram (CELEXA) 10 MG tablet Take 1 tablet (10 mg total) by mouth daily.  . clobetasol cream (TEMOVATE) 2.70 % Apply 1 application topically 2 (two) times daily as needed. (Patient taking differently: Apply 1 application topically 2 (two) times daily as needed (affected area). )  . cyclobenzaprine (FLEXERIL) 10 MG tablet Take 1 tablet (10 mg total) by mouth 2 (two) times daily as needed for muscle spasms.  Marland Kitchen docusate sodium (COLACE) 100 MG capsule Take 1 capsule (100 mg total) by mouth 2 (two) times daily. (Patient taking differently: Take 100 mg by mouth 2 (two) times daily as needed for mild constipation. )  . gabapentin (NEURONTIN) 300 MG capsule Take 1 capsule (300 mg total) by mouth 3 (three) times daily.  Marland Kitchen HYDROcodone-acetaminophen (NORCO/VICODIN) 5-325 MG per tablet Take 2 tablets by mouth every 4 (four) hours as needed. (Patient taking differently: Take 1-2 tablets by mouth every 4 (four) hours as needed for moderate pain or severe pain. )  . hydrOXYzine (VISTARIL) 50 MG capsule Take 2 capsules (100 mg total) by mouth at bedtime as needed.  . lidocaine (XYLOCAINE) 5 % ointment Apply 1 application topically 4 (four) times daily as needed.  . loratadine (CLARITIN) 10 MG tablet Take 1  tablet (10 mg total) by mouth daily.  Marland Kitchen LORazepam (ATIVAN) 0.5 MG tablet Take 1 tablet (0.5 mg total) by mouth every 8 (eight) hours as needed for anxiety.  . methocarbamol (ROBAXIN) 500 MG tablet Take 1 tablet (500 mg total) by mouth 4 (four) times daily.  . mometasone (NASONEX) 50 MCG/ACT nasal spray Place 2 sprays into the nose daily. (Patient taking differently: Place 2 sprays into the nose daily as needed (allergies). )  . traZODone (DESYREL) 50 MG tablet TAKE 1/2 TO 1 TABLET BY MOUTH EVERY DAY AT BEDTIME AS NEEDED FOR SLEEP (Patient taking differently: Take 25-50 mg by mouth at bedtime as needed for sleep. )  . vitamin C (ASCORBIC ACID) 500 MG tablet Take 1 tablet (500 mg total) by mouth daily.  . meloxicam (MOBIC) 15 MG tablet Take 1 tablet (15 mg total) by mouth daily. (Patient not taking: Reported on 04/12/2016)  . oxyCODONE-acetaminophen (PERCOCET) 10-325 MG tablet Take 1 tablet by mouth every 4 (four) hours as needed for pain. (Patient not taking: Reported on 04/12/2016)  . sertraline (ZOLOFT) 50 MG tablet Take 2 tablets (100 mg total) by mouth daily. (Patient not taking: Reported on 04/12/2016)  . valACYclovir (VALTREX) 1000 MG tablet Take 2 tablets (2,000 mg total) by mouth 2 (two) times daily. (Patient not taking: Reported on 04/12/2016)  . [DISCONTINUED] clotrimazole (LOTRIMIN) 1 % cream Apply 1 application topically 2 (two) times daily. (  Patient not taking: Reported on 04/12/2016)  . [DISCONTINUED] docusate sodium (COLACE) 100 MG capsule Take 1 capsule (100 mg total) by mouth 2 (two) times daily.  . [DISCONTINUED] docusate sodium (COLACE) 100 MG capsule Take 1 capsule (100 mg total) by mouth 2 (two) times daily.  . [DISCONTINUED] methocarbamol (ROBAXIN) 500 MG tablet Take 1 tablet (500 mg total) by mouth 4 (four) times daily.  . [DISCONTINUED] oxyCODONE-acetaminophen (PERCOCET) 10-325 MG tablet Take 1 tablet by mouth every 4 (four) hours as needed for pain. (Patient not taking: Reported on  04/12/2016)   No facility-administered encounter medications on file as of 04/12/2016.     Allergies (verified) Codeine   History: Past Medical History:  Diagnosis Date  . Allergy    seasonal  . Anxiety 2009  . Arthritis 2000  . Depression   . Fibroid 2003  . Hot flashes, menopausal 2012  . RLS (restless legs syndrome)   . Shortness of breath    with exertion denies  . Wears glasses    Past Surgical History:  Procedure Laterality Date  . ARTHROTOMY Right 11/09/2015   Procedure: RIGHT WRIST DISTAL RADIAL ULNAR JOINT ARTHROTOMY AND DEBRIDEMENT,  AND ;  Surgeon: Iran Planas, MD;  Location: [place];  Service: Orthopedics;  Laterality: Right;  . COLONOSCOPY    . HARDWARE REMOVAL Right 07/24/2013   Procedure: RIGHT WRIST HARDWARE REMOVAL, JOINT RELEASE, RIGHT HAND MANIPULATION UNDER ANESTHESIA;  Surgeon: Linna Hoff, MD;  Location: [place];  Service: Orthopedics;  Laterality: Right;  . NECK SURGERY  2003   Cervical vertebroplasty   . OPEN REDUCTION INTERNAL FIXATION (ORIF) DISTAL RADIAL FRACTURE Right 11/08/2012   Procedure: OPEN REDUCTION INTERNAL FIXATION (ORIF) RIGHT DISTAL RADIUS FRACTURE;  Surgeon: Linna Hoff, MD;  Location: [facility];  Service: Orthopedics;  Laterality: Right;  . WRIST ARTHROPLASTY Right 11/09/2015   Procedure: OR DISTAL ULNAR RESECTION ARTHROPLASTY ;  Surgeon: Iran Planas, MD;  Location: [place];  Service: Orthopedics;  Laterality: Right;  . WRIST ARTHROSCOPY WITH DEBRIDEMENT Right 07/21/2014   Procedure: RIGHT WRIST DISTAL RADIAL ULNAR JOINT DEBRIDEMENT AND JOINT RELEASE POSSIBLE TENDON INTERPOSITION;  Surgeon: Linna Hoff, MD;  Location: [place];  Service: Orthopedics;  Laterality: Right;   Family History  Problem Relation Age of Onset  . Hypertension Mother   . Hyperlipidemia Mother   . Early death Sister 10    Drug overdose  . Dementia Father   . Cancer Father     Throat cancer   . Parkinson's disease Father   .  Hypertension Daughter   . Diabetes Daughter   . Cancer Brother     Throat   Social History   Occupational History  . Unemployed   [place]    [employer]    Social History Main Topics  . Smoking status: Current Every Day Smoker    Packs/day: 1.00    Years: 41.00    Types: Cigarettes    Start date: 04/13/1975  . Smokeless tobacco: Never Used  . Alcohol use No     Comment: Holidays only  . Drug use: No  . Sexual activity: Yes    Partners: Male    Birth control/ protection: None, Post-menopausal    Tobacco Counseling Ready to quit: No Counseling given: Yes   Activities of Daily Living In your present state of health, do you have any difficulty performing the following activities: 04/12/2016 11/02/2015  Hearing? N N  Vision? N N  Difficulty concentrating or  making decisions? N N  Walking or climbing stairs? Y N  Dressing or bathing? N N  Doing errands, shopping? N N  Preparing Food and eating ? N -  Using the Toilet? N -  In the past six months, have you accidently leaked urine? N -  Do you have problems with loss of bowel control? N -  Managing your Medications? N -  Managing your Finances? N -  Housekeeping or managing your Housekeeping? N -  Some recent data might be hidden   Home Safety:  My home has a working smoke alarm:  Yes           My home throw rugs have been fastened down to the floor or removed:  Yes I have non-slip mats in the bathtub and shower:  Yes         All my home's stairs have railings or bannisters: 7 outside steps with railing         My home's floors, stairs and hallways are free from clutter, wires and cords:  Yes       Immunizations and Health Maintenance Immunization History  Administered Date(s) Administered  . Influenza,inj,Quad PF,36+ Mos 07/19/2013, 07/17/2014, 06/08/2015  . PPD Test 10/12/2012  . Pneumococcal Polysaccharide-23 07/17/2014  . Tdap 03/30/2012   Health Maintenance Due  Topic Date Due  . PAP SMEAR  09/24/2012  . ZOSTAVAX   01/31/2015  . INFLUENZA VACCINE  04/12/2016  Patient will obtain zostavax vaccine at local pharmacy Patient will discuss need for pap smear with PCP  Patient Care Team: Veatrice Bourbon, MD as PCP - General (Student)  Indicate any recent Medical Services you may have received from other than [facility] providers in the past year (date may be approximate).     Assessment:   This is a routine wellness examination for [name].   Hearing/Vision screen  Hearing Screening   Method: Audiometry   '125Hz'$  '250Hz'$  '500Hz'$  '1000Hz'$  '2000Hz'$  '3000Hz'$  '4000Hz'$  '6000Hz'$  '8000Hz'$   Right ear:   '25 25 25  25    '$ Left ear:   '25 25 25  25      '$ Dietary issues and exercise activities discussed: Current Exercise Habits: Home exercise routine (Not in last 2 weeks due to husband having stroke), Type of exercise: stretching;walking;treadmill, Time (Minutes): 60, Frequency (Times/Week): 6, Weekly Exercise (Minutes/Week): 360, Intensity: Moderate, Exercise limited by: orthopedic condition(s) (Right wrist pain)  Goals    . Blood Pressure < 140/90    . Exercise 150 minutes per week (moderate activity) (pt-stated)    . Weight (lb) < 156 lb (70.8 kg)          7% weight loss      Discussed recording consumption intake to assist with desired 7% weight loss. Recommended MyPLate or My Fitness Pal apps to assist with recording.  Depression Screen PHQ 2/9 Scores 04/12/2016 01/01/2016 11/11/2015 10/30/2015 10/19/2015 03/27/2015 02/10/2015  PHQ - 2 Score 0 0 0 0 0 0 0    Fall Risk Fall Risk  04/12/2016 03/27/2015 02/28/2012  Falls in the past year? No Yes -  Number falls in past yr: - 1 -  Injury with Fall? - No -  Risk for fall due to : - - Other (Comment);Impaired balance/gait    Cognitive Function:  Mini-Cog passed with score 3/5  TUG Test:  Done in 10 seconds. Patient used both hands to push out of chair and to sit back down. Screening Tests Health Maintenance  Topic Date Due  .  PAP SMEAR  09/24/2012  . ZOSTAVAX  01/31/2015  .  INFLUENZA VACCINE  04/12/2016  . MAMMOGRAM  04/20/2017  . TETANUS/TDAP  03/30/2022  . COLONOSCOPY  04/25/2022  . Hepatitis C Screening  Completed  . HIV Screening  Completed      Plan:     During the course of the visit, [name] was educated and counseled about the following appropriate screening and preventive services:   Vaccines to include Pneumoccal, Influenza, Td, Zostavax  Cardiovascular disease screening  Colorectal cancer screening  Bone density screening  Diabetes screening  Mammography/PAP  Nutrition counseling  Smoking cessation counseling  Patient Instructions (the written plan) were given to the patient.    Velora Heckler, RN   04/12/2016

## 2016-04-27 ENCOUNTER — Other Ambulatory Visit: Payer: Self-pay | Admitting: Student

## 2016-04-27 DIAGNOSIS — M47819 Spondylosis without myelopathy or radiculopathy, site unspecified: Secondary | ICD-10-CM

## 2016-04-27 MED ORDER — CYCLOBENZAPRINE HCL 10 MG PO TABS: 10.0000 mg | ORAL_TABLET | Freq: Two times a day (BID) | ORAL | 0 refills | Status: DC | PRN

## 2016-04-27 MED ORDER — CLOBETASOL PROPIONATE 0.05 % EX CREA: 1.0000 "application " | TOPICAL_CREAM | Freq: Two times a day (BID) | CUTANEOUS | 2 refills | Status: DC | PRN

## 2016-04-27 NOTE — Telephone Encounter (Signed)
Needs refill on her itching medicine, musle relaxer and hot flash medicine. She did not know the names of the medications. [facility] on [address]

## 2016-05-06 ENCOUNTER — Other Ambulatory Visit: Payer: Self-pay | Admitting: *Deleted

## 2016-05-06 DIAGNOSIS — M47819 Spondylosis without myelopathy or radiculopathy, site unspecified: Secondary | ICD-10-CM

## 2016-05-06 NOTE — Telephone Encounter (Signed)
Pt called and states that she never received the refills from [facility] (they told her they never received anything from [place]) and she wants to change to [facility] [address] anyway.   Advised that we did send flexeril and a cream on 8/16 , again pt states that they told her multiple times that we never sent it.  Advised I would send a [facility] to [facility] [address].    I called [facility] and they said the medications have been ready since 8/16 waiting on pt to pick them up.  I asked them to cancel the refill as pt has decided to go elsewhere. [name], [name], CMA

## 2016-05-08 MED ORDER — HYDROXYZINE PAMOATE 50 MG PO CAPS: 100.0000 mg | ORAL_CAPSULE | Freq: Every evening | ORAL | 2 refills | Status: DC | PRN

## 2016-05-08 MED ORDER — CLOBETASOL PROPIONATE 0.05 % EX CREA: 1.0000 "application " | TOPICAL_CREAM | Freq: Two times a day (BID) | CUTANEOUS | 2 refills | Status: DC | PRN

## 2016-05-08 MED ORDER — CYCLOBENZAPRINE HCL 10 MG PO TABS: 10.0000 mg | ORAL_TABLET | Freq: Two times a day (BID) | ORAL | 3 refills | Status: DC | PRN

## 2016-05-08 MED ORDER — TRAZODONE HCL 50 MG PO TABS: ORAL_TABLET | ORAL | 2 refills | Status: DC

## 2016-05-27 ENCOUNTER — Telehealth: Payer: Self-pay

## 2016-05-27 DIAGNOSIS — Z4789 Encounter for other orthopedic aftercare: Secondary | ICD-10-CM | POA: Diagnosis not present

## 2016-05-27 DIAGNOSIS — S52561D Barton's fracture of right radius, subsequent encounter for closed fracture with routine healing: Secondary | ICD-10-CM | POA: Diagnosis not present

## 2016-05-27 MED ORDER — LORAZEPAM 0.5 MG PO TABS: 0.5000 mg | ORAL_TABLET | Freq: Three times a day (TID) | ORAL | 0 refills | Status: DC | PRN

## 2016-05-27 MED ORDER — CITALOPRAM HYDROBROMIDE 10 MG PO TABS: 10.0000 mg | ORAL_TABLET | Freq: Every day | ORAL | 0 refills | Status: DC

## 2016-05-27 MED ORDER — METHOCARBAMOL 500 MG PO TABS: 500.0000 mg | ORAL_TABLET | Freq: Four times a day (QID) | ORAL | 0 refills | Status: DC

## 2016-05-27 NOTE — Progress Notes (Signed)
I have reviewed this visit and discussed with Howell Rucks, RN, BSN, and agree with her documentation.   Kamile Fassler A. Lincoln Brigham MD, [facility] Resident PGY-3 Pager [phone]

## 2016-05-27 NOTE — Telephone Encounter (Signed)
Dr. Lincoln Brigham left rx on my desk to leave at the front for pick up (lorazepam) I informed pt rx is ready for pick up.

## 2016-06-13 DIAGNOSIS — S52561D Barton's fracture of right radius, subsequent encounter for closed fracture with routine healing: Secondary | ICD-10-CM | POA: Diagnosis not present

## 2016-06-13 DIAGNOSIS — Z4789 Encounter for other orthopedic aftercare: Secondary | ICD-10-CM | POA: Diagnosis not present

## 2016-06-20 DIAGNOSIS — S52561D Barton's fracture of right radius, subsequent encounter for closed fracture with routine healing: Secondary | ICD-10-CM | POA: Diagnosis not present

## 2016-06-20 DIAGNOSIS — Z4789 Encounter for other orthopedic aftercare: Secondary | ICD-10-CM | POA: Diagnosis not present

## 2016-09-23 DIAGNOSIS — Z4789 Encounter for other orthopedic aftercare: Secondary | ICD-10-CM | POA: Diagnosis not present

## 2016-09-23 DIAGNOSIS — S52561D Barton's fracture of right radius, subsequent encounter for closed fracture with routine healing: Secondary | ICD-10-CM | POA: Diagnosis not present

## 2016-10-07 ENCOUNTER — Ambulatory Visit (INDEPENDENT_AMBULATORY_CARE_PROVIDER_SITE_OTHER): Payer: Medicare Other | Admitting: *Deleted

## 2016-10-07 DIAGNOSIS — Z23 Encounter for immunization: Secondary | ICD-10-CM | POA: Diagnosis not present

## 2016-10-17 ENCOUNTER — Telehealth: Payer: Self-pay | Admitting: Student

## 2016-10-17 NOTE — Telephone Encounter (Signed)
Pt would like a referral [facility]. ep

## 2016-10-17 NOTE — Telephone Encounter (Signed)
Spoke with patient and informed her that per our HM tab, patient wasn't due for her mammogram until 04/2017.  Patient voiced understanding.  [name],CMA

## 2016-10-21 DIAGNOSIS — S52561D Barton's fracture of right radius, subsequent encounter for closed fracture with routine healing: Secondary | ICD-10-CM | POA: Diagnosis not present

## 2016-10-25 ENCOUNTER — Ambulatory Visit: Payer: Medicare Other | Admitting: Student

## 2016-11-03 ENCOUNTER — Ambulatory Visit: Payer: Medicare Other | Admitting: Student

## 2016-12-20 ENCOUNTER — Ambulatory Visit (INDEPENDENT_AMBULATORY_CARE_PROVIDER_SITE_OTHER): Payer: Medicare Other | Admitting: Family Medicine

## 2016-12-20 ENCOUNTER — Other Ambulatory Visit: Payer: Self-pay | Admitting: *Deleted

## 2016-12-20 ENCOUNTER — Encounter: Payer: Self-pay | Admitting: Family Medicine

## 2016-12-20 VITALS — BP 138/78 | HR 98 | Temp 97.8°F | Wt 172.8 lb

## 2016-12-20 DIAGNOSIS — M47819 Spondylosis without myelopathy or radiculopathy, site unspecified: Secondary | ICD-10-CM

## 2016-12-20 DIAGNOSIS — M79601 Pain in right arm: Secondary | ICD-10-CM

## 2016-12-20 DIAGNOSIS — M4690 Unspecified inflammatory spondylopathy, site unspecified: Secondary | ICD-10-CM | POA: Diagnosis not present

## 2016-12-20 DIAGNOSIS — H5713 Ocular pain, bilateral: Secondary | ICD-10-CM

## 2016-12-20 MED ORDER — CYCLOBENZAPRINE HCL 10 MG PO TABS: 10.0000 mg | ORAL_TABLET | Freq: Two times a day (BID) | ORAL | 0 refills | Status: DC | PRN

## 2016-12-20 NOTE — Telephone Encounter (Signed)
Please call the patient and ask her to schedule an appointment for med refill. She has not seen her PCP since 11/2015 and she will need to be seen prior to filling many of the medications she is requesting

## 2016-12-20 NOTE — Patient Instructions (Signed)
Neck Exercises Neck exercises can be important for many reasons:  They can help you to improve and maintain flexibility in your neck. This can be especially important as you age.  They can help to make your neck stronger. This can make movement easier.  They can reduce or prevent neck pain.  They may help your upper back.  Ask your health care provider which neck exercises would be best for you. Exercises Neck Press Repeat this exercise 10 times. Do it first thing in the morning and right before bed or as told by your health care provider. 1. Lie on your back on a firm bed or on the floor with a pillow under your head. 2. Use your neck muscles to push your head down on the pillow and straighten your spine. 3. Hold the position as well as you can. Keep your head facing up and your chin tucked. 4. Slowly count to 5 while holding this position. 5. Relax for a few seconds. Then repeat.  Isometric Strengthening Do a full set of these exercises 2 times a day or as told by your health care provider. 1. Sit in a supportive chair and place your hand on your forehead. 2. Push forward with your head and neck while pushing back with your hand. Hold for 10 seconds. 3. Relax. Then repeat the exercise 3 times. 4. Next, do thesequence again, this time putting your hand against the back of your head. Use your head and neck to push backward against the hand pressure. 5. Finally, do the same exercise on either side of your head, pushing sideways against the pressure of your hand.  Prone Head Lifts Repeat this exercise 5 times. Do this 2 times a day or as told by your health care provider. 1. Lie face-down, resting on your elbows so that your chest and upper back are raised. 2. Start with your head facing downward, near your chest. Position your chin either on or near your chest. 3. Slowly lift your head upward. Lift until you are looking straight ahead. Then continue lifting your head as far back as  you can stretch. 4. Hold your head up for 5 seconds. Then slowly lower it to your starting position.  Supine Head Lifts Repeat this exercise 8-10 times. Do this 2 times a day or as told by your health care provider. 1. Lie on your back, bending your knees to point to the ceiling and keeping your feet flat on the floor. 2. Lift your head slowly off the floor, raising your chin toward your chest. 3. Hold for 5 seconds. 4. Relax and repeat.  Scapular Retraction Repeat this exercise 5 times. Do this 2 times a day or as told by your health care provider. 1. Stand with your arms at your sides. Look straight ahead. 2. Slowly pull both shoulders backward and downward until you feel a stretch between your shoulder blades in your upper back. 3. Hold for 10-30 seconds. 4. Relax and repeat.  Contact a health care provider if:  Your neck pain or discomfort gets much worse when you do an exercise.  Your neck pain or discomfort does not improve within 2 hours after you exercise. If you have any of these problems, stop exercising right away. Do not do the exercises again unless your health care provider says that you can. Get help right away if:  You develop sudden, severe neck pain. If this happens, stop exercising right away. Do not do the exercises again unless your   health care provider says that you can. Exercises Neck Stretch  Repeat this exercise 3-5 times. 1. Do this exercise while standing or while sitting in a chair. 2. Place your feet flat on the floor, shoulder-width apart. 3. Slowly turn your head to the right. Turn it all the way to the right so you can look over your right shoulder. Do not tilt or tip your head. 4. Hold this position for 10-30 seconds. 5. Slowly turn your head to the left, to look over your left shoulder. 6. Hold this position for 10-30 seconds.  Neck Retraction Repeat this exercise 8-10 times. Do this 3-4 times a day or as told by your health care  provider. 1. Do this exercise while standing or while sitting in a sturdy chair. 2. Look straight ahead. Do not bend your neck. 3. Use your fingers to push your chin backward. Do not bend your neck for this movement. Continue to face straight ahead. If you are doing the exercise properly, you will feel a slight sensation in your throat and a stretch at the back of your neck. 4. Hold the stretch for 1-2 seconds. Relax and repeat.  This information is not intended to replace advice given to you by your health care provider. Make sure you discuss any questions you have with your health care provider. Document Released: 08/10/2015 Document Revised: 02/04/2016 Document Reviewed: 03/09/2015 Elsevier Interactive Patient Education  2017 Elsevier Inc.  

## 2016-12-20 NOTE — Progress Notes (Signed)
   Subjective:   [name] is a 61 y.o. female with a history of Anxiety, tobacco abuse, insomnia, depression here for same day appointment for  Chief Complaint  Patient presents with  . right arm pain    EXTREMITY PAIN  Location: Entire right arm Pain started: This morning Pain is: Aching pain that came out of nowhere and extends from shoulder all the way to her fingertips - no related neck pain though patient has had neck surgery in the past Severity: She reports that it was almost so bad that it almost caused a panic attack earlier today, but has now resolved after taking Norco which she is prescribed for her chronic wrist pain after several wrist surgeries Medications tried: Norco as above Recent trauma: No Similar pain previously: No  Symptoms Redness: No Swelling: No Fever: No Weakness: No Weight loss: No Rash: No  Review of Symptoms - see HPI PMH - Smoking status noted.     Objective:  BP 138/78 (BP Location: Left Arm, Patient Position: Sitting, Cuff Size: Normal)   Pulse 98   Temp 97.8 F (36.6 C) (Oral)   Wt 172 lb 12.8 oz (78.4 kg)   SpO2 98%   BMI 27.06 kg/m   Gen:  61 y.o. female in NAD HEENT: NCAT, MMM, anicteric sclerae CV: RRR, no MRG Resp: Non-labored, CTAB, no wheezes noted Ext: WWP, no edema MSK: Neck: Decreased range of motion in all planes. Spurling's negative. No tenderness to palpation over spinous processes or paraspinal musculature Right arm: Shoulder and elbow without any deformity, swelling, redness, tenderness to palpation. Right wrist with multiple surgical scars and inability to supinate and pronate much. Strength intact for internal and external rotation, shoulder flexion, abduction, adduction, elbow flexion and extension, grip strength Neuro: Alert and oriented, speech normal, sensation intact throughout upper extremities to light touch       Chemistry      Component Value Date/Time   NA 140 02/10/2015 1526   K 4.6 02/10/2015  1526   CL 104 02/10/2015 1526   CO2 27 02/10/2015 1526   BUN 21 02/10/2015 1526   CREATININE 0.95 02/10/2015 1526      Component Value Date/Time   CALCIUM 10.0 02/10/2015 1526   ALKPHOS 81 01/02/2014 1703   AST 13 01/02/2014 1703   ALT 13 01/02/2014 1703   BILITOT 0.9 01/02/2014 1703      Lab Results  Component Value Date   WBC 9.7 11/02/2015   HGB 13.1 11/02/2015   HCT 41.6 11/02/2015   MCV 87.2 11/02/2015   PLT 394 11/02/2015   Lab Results  Component Value Date   TSH 2.523 02/10/2015   No results found for: HGBA1C Assessment & Plan:     [name] is a 61 y.o. female here for   Right arm pain No signs of radiculopathy currently, but may have some radiation of pain from tight neck muscles Does have limited range of motion of her neck and wrist after multiple operations No red flag symptoms Advised Flexeril, heat or ice, neck stretching exercises to help with neck tightness which may help with radiation of pain Return precautions discussed   Virginia Crews, MD MPH PGY-3,  [facility] 12/21/2016  3:47 PM

## 2016-12-20 NOTE — Telephone Encounter (Signed)
Patient has an appt on 12-27-16 with Dr. Lincoln Brigham. Debar Plate,CMA

## 2016-12-21 DIAGNOSIS — M79601 Pain in right arm: Secondary | ICD-10-CM | POA: Insufficient documentation

## 2016-12-21 NOTE — Assessment & Plan Note (Signed)
No signs of radiculopathy currently, but may have some radiation of pain from tight neck muscles Does have limited range of motion of her neck and wrist after multiple operations No red flag symptoms Advised Flexeril, heat or ice, neck stretching exercises to help with neck tightness which may help with radiation of pain Return precautions discussed

## 2016-12-23 DIAGNOSIS — S52561D Barton's fracture of right radius, subsequent encounter for closed fracture with routine healing: Secondary | ICD-10-CM | POA: Diagnosis not present

## 2016-12-27 ENCOUNTER — Ambulatory Visit: Payer: Medicare Other | Admitting: Student

## 2016-12-28 ENCOUNTER — Ambulatory Visit (INDEPENDENT_AMBULATORY_CARE_PROVIDER_SITE_OTHER): Payer: Medicare Other | Admitting: Student

## 2016-12-28 DIAGNOSIS — R000 Tachycardia, unspecified: Secondary | ICD-10-CM | POA: Insufficient documentation

## 2016-12-28 DIAGNOSIS — M4690 Unspecified inflammatory spondylopathy, site unspecified: Secondary | ICD-10-CM | POA: Diagnosis not present

## 2016-12-28 DIAGNOSIS — G2581 Restless legs syndrome: Secondary | ICD-10-CM

## 2016-12-28 DIAGNOSIS — F419 Anxiety disorder, unspecified: Secondary | ICD-10-CM | POA: Diagnosis not present

## 2016-12-28 DIAGNOSIS — M47819 Spondylosis without myelopathy or radiculopathy, site unspecified: Secondary | ICD-10-CM

## 2016-12-28 DIAGNOSIS — H5713 Ocular pain, bilateral: Secondary | ICD-10-CM

## 2016-12-28 MED ORDER — HYDROXYZINE PAMOATE 50 MG PO CAPS: 100.0000 mg | ORAL_CAPSULE | Freq: Every evening | ORAL | 2 refills | Status: DC | PRN

## 2016-12-28 MED ORDER — MOMETASONE FUROATE 50 MCG/ACT NA SUSP: 2.0000 | Freq: Every day | NASAL | 12 refills | Status: DC

## 2016-12-28 MED ORDER — TRAZODONE HCL 50 MG PO TABS: ORAL_TABLET | ORAL | 2 refills | Status: DC

## 2016-12-28 MED ORDER — CYCLOBENZAPRINE HCL 10 MG PO TABS: 10.0000 mg | ORAL_TABLET | Freq: Two times a day (BID) | ORAL | 0 refills | Status: DC | PRN

## 2016-12-28 MED ORDER — GABAPENTIN 300 MG PO CAPS: 300.0000 mg | ORAL_CAPSULE | Freq: Three times a day (TID) | ORAL | 3 refills | Status: DC

## 2016-12-28 MED ORDER — DOCUSATE SODIUM 100 MG PO CAPS: 100.0000 mg | ORAL_CAPSULE | Freq: Two times a day (BID) | ORAL | 1 refills | Status: DC

## 2016-12-28 MED ORDER — LORATADINE 10 MG PO TABS: 10.0000 mg | ORAL_TABLET | Freq: Every day | ORAL | 2 refills | Status: DC

## 2016-12-28 MED ORDER — MELOXICAM 15 MG PO TABS: 15.0000 mg | ORAL_TABLET | Freq: Every day | ORAL | 1 refills | Status: DC

## 2016-12-28 MED ORDER — CARBAMIDE PEROXIDE 6.5 % OT SOLN
5.0000 [drp] | Freq: Two times a day (BID) | OTIC | 0 refills | Status: DC
Start: 2016-12-28 — End: 2020-06-23

## 2016-12-28 MED ORDER — METHOCARBAMOL 500 MG PO TABS: 500.0000 mg | ORAL_TABLET | Freq: Four times a day (QID) | ORAL | 0 refills | Status: DC

## 2016-12-28 MED ORDER — CITALOPRAM HYDROBROMIDE 10 MG PO TABS: 10.0000 mg | ORAL_TABLET | Freq: Every day | ORAL | 2 refills | Status: DC

## 2016-12-28 NOTE — Assessment & Plan Note (Signed)
Mild tachycardia without chest pain, SOB or sensation of palpitations. Regular rhythm - will follow closely - return precautions discussed

## 2016-12-28 NOTE — Assessment & Plan Note (Signed)
Stable. Pateint advised to use celexa daily. She will follow as needed

## 2016-12-28 NOTE — Progress Notes (Signed)
   Subjective:    Patient ID: [name], female    DOB: [birth year], 61 y.o.   MRN: [id number]   CC: med refill  HPI: 61 y/o F presents for medication refill  Depression - on celexa, has been taking I only sporadically - patient states herm ood has been stable  Anxiety - started on xanax for anxiety surrounding arm surgery one year ago - has not taken it in some time and feels her anxiety is stable without it  Tachycardia - patitnt mildly tachycardic in the office, no chest pain or sensation of palpittaions  Smoking status reviewed  Review of Systems  Per HPI, else denies recent illness,chest pain, shortness of breath,     Objective:  BP 120/74   Pulse (!) 116   Temp 98.4 F (36.9 C) (Oral)   Wt 168 lb (76.2 kg)   SpO2 95%   BMI 26.31 kg/m  Vitals and nursing note reviewed  General: NAD Cardiac: Tachycardic, regular rhythm  Respiratory: CTAB, normal effort Skin: warm and dry, no rashes noted Neuro: alert and oriented, no focal deficits   Assessment & Plan:    Anxiety Stable, will stop xanax a this time and will follow as needed  Depression Stable. Pateint advised to use celexa daily. She will follow as needed  Tachycardia Mild tachycardia without chest pain, SOB or sensation of palpitations. Regular rhythm - will follow closely - return precautions discussed    [name] A. Lincoln Brigham MD, [facility] Resident PGY-3 Pager [phone]

## 2016-12-28 NOTE — Patient Instructions (Addendum)
Follow up in one week for pap smear  call the office with questions or concerns

## 2016-12-28 NOTE — Assessment & Plan Note (Signed)
Stable, will stop xanax a this time and will follow as needed

## 2017-01-03 ENCOUNTER — Other Ambulatory Visit (HOSPITAL_COMMUNITY)
Admission: RE | Admit: 2017-01-03 | Discharge: 2017-01-03 | Disposition: A | Discharge status: Home / Self Care | Payer: Medicare Other | Source: Ambulatory Visit | Attending: Family Medicine | Admitting: Family Medicine

## 2017-01-03 ENCOUNTER — Encounter: Payer: Self-pay | Admitting: Student

## 2017-01-03 ENCOUNTER — Ambulatory Visit (INDEPENDENT_AMBULATORY_CARE_PROVIDER_SITE_OTHER): Payer: Medicare Other | Admitting: Student

## 2017-01-03 VITALS — BP 136/70 | HR 90 | Temp 98.4°F | Wt 168.0 lb

## 2017-01-03 DIAGNOSIS — Z0000 Encounter for general adult medical examination without abnormal findings: Secondary | ICD-10-CM | POA: Diagnosis not present

## 2017-01-03 DIAGNOSIS — Z124 Encounter for screening for malignant neoplasm of cervix: Secondary | ICD-10-CM

## 2017-01-03 DIAGNOSIS — N898 Other specified noninflammatory disorders of vagina: Secondary | ICD-10-CM

## 2017-01-03 LAB — POCT WET PREP (WET MOUNT)
CLUE CELLS WET PREP WHIFF POC: NEGATIVE
Trichomonas Wet Prep HPF POC: ABSENT

## 2017-01-03 NOTE — Patient Instructions (Addendum)
You will be called about your test results Follow up as needed If you  Have questions or concerns, call the ffice at [phone]

## 2017-01-03 NOTE — Progress Notes (Signed)
Please call the patient and tell her that her Wet prep was normal

## 2017-01-03 NOTE — Progress Notes (Signed)
   Subjective:    Patient ID: [name], female    DOB: June 01, 1956, 61 y.o.   MRN: [id number]   CC: pap smear, vaginal discharge  HPI: 61 y/o F presents for pap smear and vaginal discharge   Pap - denies history of abnormal paps - last pap in 2011, was normal no HPV component  Vaginal discharge - has had ongoing vaginal dischargh - no vaginal irritation, abdominal pain, dysuria or fevers - she is sexually active with her husband  Smoking status reviewed  Review of Systems  Per HPI, else denies recent illness, fever,  chest pain, shortness of breath,     Objective:  BP 136/70   Pulse 90   Temp 98.4 F (36.9 C) (Oral)   Wt 168 lb (76.2 kg)   SpO2 99%   BMI 26.31 kg/m  Vitals and nursing note reviewed  General: NAD Cardiac: RRR,  Respiratory: CTAB, normal effort Skin: warm and dry, no rashes noted Neuro: alert and oriented, no focal deficits Pelvic: Normal EGBUS, normal vaginal canal, normal cervix with no CMT, normal mobile uterus, normal adnexa with no masses, no adnexal tenderness     Assessment & Plan:    Healthcare maintenance Pap smear today, will follow  Vaginal discharge Wet prep and GC/Ct collected - wet prep negative - will follow GC/Ct and treat as needed    Kotaro Buer A. Lincoln Brigham MD, [facility] Resident PGY-3 Pager [phone]

## 2017-01-03 NOTE — Assessment & Plan Note (Signed)
Pap smear today, will follow

## 2017-01-03 NOTE — Assessment & Plan Note (Signed)
Wet prep and GC/Ct collected - wet prep negative - will follow GC/Ct and treat as needed

## 2017-01-04 ENCOUNTER — Telehealth: Payer: Self-pay

## 2017-01-04 NOTE — Telephone Encounter (Signed)
Attempted to contact pt to inform of normal results. However, no answer, VM was left to call the office. If patient calls back please inform her of above.

## 2017-01-04 NOTE — Telephone Encounter (Signed)
-----   Message from Veatrice Bourbon, MD sent at 01/03/2017  4:55 PM EDT ----- Please call the patient and tell her that her Wet prep was normal

## 2017-01-05 LAB — CYTOLOGY - PAP
Chlamydia: NEGATIVE
Diagnosis: NEGATIVE
HPV: NOT DETECTED
Neisseria Gonorrhea: NEGATIVE

## 2017-01-06 ENCOUNTER — Telehealth: Payer: Self-pay | Admitting: Student

## 2017-01-06 NOTE — Telephone Encounter (Signed)
Please inform the patient that her pap is normal

## 2017-01-06 NOTE — Telephone Encounter (Signed)
Left message for patient to call back  

## 2017-02-10 DIAGNOSIS — S52561D Barton's fracture of right radius, subsequent encounter for closed fracture with routine healing: Secondary | ICD-10-CM | POA: Diagnosis not present

## 2017-02-16 ENCOUNTER — Encounter: Payer: Self-pay | Admitting: Student

## 2017-02-16 ENCOUNTER — Ambulatory Visit (INDEPENDENT_AMBULATORY_CARE_PROVIDER_SITE_OTHER): Payer: Medicare Other | Admitting: Student

## 2017-02-16 VITALS — BP 122/61 | HR 92 | Temp 97.6°F | Wt 174.0 lb

## 2017-02-16 DIAGNOSIS — M79604 Pain in right leg: Secondary | ICD-10-CM | POA: Diagnosis not present

## 2017-02-16 NOTE — Progress Notes (Signed)
   Subjective:    Patient ID: [name], female    DOB: 1955-09-18, 61 y.o.   MRN: [id number]   CC: right leg pain  HPI: 61 y/o F with PMH of lumbar spine arthritis presents for right leg pain  Right leg pain - for the last 1-2 months she has noted occasional shooting pain from her right hip to her right ankle - no known injury, no weakness, no difficulty walking, no falls - last had pain while walking around at a family member's graduation a week ago - no pain today  Smoking status reviewed  Review of Systems  Per HPI, else denies recent illness, fever, chest pain, shortness of breath,     Objective:  BP 122/61   Pulse 92   Temp 97.6 F (36.4 C) (Oral)   Wt 174 lb (78.9 kg)   SpO2 97%   BMI 27.25 kg/m  Vitals and nursing note reviewed  General: NAD Cardiac: RRR,  Respiratory: CTAB, normal effort MSK: no lumbar spinal tenderness, no hip pain on palpation, negative straight leg bilaterally, 5/5 LE strength, normal gait Extremities: no edema or cyanosis. WWP. Skin: warm and dry, no rashes noted Neuro: alert and oriented, no focal deficits   Assessment & Plan:    Right leg pain History of right leg pain concerning for neuropathic pain especially given her known lumbar arthritis. Although her exam was benign thereis still some concern for worsening arthritis in herb back - will obtain Xray of her back - NSAIDs as needed for pain - follow xray results    Franki Stemen A. Lincoln Brigham MD, [facility] Resident PGY-3 Pager [phone]

## 2017-02-16 NOTE — Assessment & Plan Note (Signed)
History of right leg pain concerning for neuropathic pain especially given her known lumbar arthritis. Although her exam was benign thereis still some concern for worsening arthritis in herb back - will obtain Xray of her back - NSAIDs as needed for pain - follow xray results

## 2017-02-16 NOTE — Patient Instructions (Signed)
Follow up as needed Obtain Xray, you will be called about the results Call the office with questions or concerns

## 2017-03-14 DIAGNOSIS — H04123 Dry eye syndrome of bilateral lacrimal glands: Secondary | ICD-10-CM | POA: Diagnosis not present

## 2017-03-14 DIAGNOSIS — H25813 Combined forms of age-related cataract, bilateral: Secondary | ICD-10-CM | POA: Diagnosis not present

## 2017-03-28 DIAGNOSIS — M5412 Radiculopathy, cervical region: Secondary | ICD-10-CM | POA: Diagnosis not present

## 2017-03-28 DIAGNOSIS — M50823 Other cervical disc disorders at C6-C7 level: Secondary | ICD-10-CM | POA: Diagnosis not present

## 2017-04-19 ENCOUNTER — Ambulatory Visit: Payer: Medicare Other | Attending: Student | Admitting: Physical Therapy

## 2017-04-19 DIAGNOSIS — M50123 Cervical disc disorder at C6-C7 level with radiculopathy: Secondary | ICD-10-CM | POA: Insufficient documentation

## 2017-04-28 DIAGNOSIS — M50823 Other cervical disc disorders at C6-C7 level: Secondary | ICD-10-CM | POA: Diagnosis not present

## 2017-05-08 ENCOUNTER — Ambulatory Visit: Payer: Medicare Other | Admitting: Nurse Practitioner

## 2017-05-11 ENCOUNTER — Ambulatory Visit: Payer: Medicare Other | Admitting: Student

## 2017-05-26 ENCOUNTER — Ambulatory Visit: Payer: Medicare Other | Attending: Student | Admitting: Rehabilitation

## 2017-05-29 ENCOUNTER — Ambulatory Visit: Payer: Medicare Other | Admitting: Student

## 2017-06-20 ENCOUNTER — Encounter: Payer: Self-pay | Admitting: Family Medicine

## 2017-06-20 ENCOUNTER — Ambulatory Visit (INDEPENDENT_AMBULATORY_CARE_PROVIDER_SITE_OTHER): Payer: Medicare Other | Admitting: Family Medicine

## 2017-06-20 VITALS — BP 118/68 | HR 93 | Temp 97.9°F | Ht 66.0 in | Wt 169.8 lb

## 2017-06-20 DIAGNOSIS — M47819 Spondylosis without myelopathy or radiculopathy, site unspecified: Secondary | ICD-10-CM | POA: Diagnosis not present

## 2017-06-20 DIAGNOSIS — R350 Frequency of micturition: Secondary | ICD-10-CM | POA: Diagnosis not present

## 2017-06-20 DIAGNOSIS — M545 Low back pain, unspecified: Secondary | ICD-10-CM

## 2017-06-20 LAB — POCT URINALYSIS DIP (MANUAL ENTRY)
BILIRUBIN UA: NEGATIVE
GLUCOSE UA: NEGATIVE mg/dL
Ketones, POC UA: NEGATIVE mg/dL
NITRITE UA: NEGATIVE
Protein Ur, POC: NEGATIVE mg/dL
RBC UA: NEGATIVE
Spec Grav, UA: 1.02 (ref 1.010–1.025)
Urobilinogen, UA: 0.2 E.U./dL
pH, UA: 7 (ref 5.0–8.0)

## 2017-06-20 LAB — POCT UA - MICROSCOPIC ONLY

## 2017-06-20 MED ORDER — CYCLOBENZAPRINE HCL 10 MG PO TABS: 10.0000 mg | ORAL_TABLET | Freq: Two times a day (BID) | ORAL | 0 refills | Status: DC | PRN

## 2017-06-20 NOTE — Progress Notes (Signed)
   Subjective:   Patient ID: [name]    DOB: 09-10-1956, 61 y.o. female   MRN: [id number]  CC: "Back pain"  HPI: [name] is a 61 y.o. female who presents for a same day appointment concerning back pain. Problems discussed today are as follows:  BACK PAIN  Onset: day 3, started 06/17/2017 Pain characteristic: sharp, intermitent Patient has tried: Norco x1, tylenol arthritis, muscle relaxer x1 with some relief using both Pain radiates: radiates to abdomen bilaterally History of trauma or injury: was lifting groceries prior to sudden onset, no trauma Prior history of similar pain: yes "off and on", however never radiated before History of cancer: no Weak immune system: no History of IV drug use: no History of steroid use: no  Symptoms Incontinence of bowel or bladder: no Saddle anesthesia: no Radiculopathy: no Fever: no Rest or nocturnal pain: yes, wakes her up Weight Loss: no Rash: no  Complete ROS performed, see HPI for pertinent.  [place]: Tobacco use disorder, OA. Surgical history ORIF distal radial fracture, cervical spine vertebroplasty, family history HTN, Parkinson's disease, throat cancer (father). Smoking status reviewed. Medications reviewed.  Objective:   BP 118/68   Pulse 93   Temp 97.9 F (36.6 C) (Oral)   Ht 5\' 6"  (1.676 m)   Wt 169 lb 12.8 oz (77 kg)   SpO2 98%   BMI 27.41 kg/m  Vitals and nursing note reviewed.  General: well nourished, well developed, in no acute distress with non-toxic appearance Neck: supple, non-tender without lymphadenopathy CV: regular rate and rhythm without murmurs, rubs, or gallops, no lower extremity edema Lungs: clear to auscultation bilaterally with normal work of breathing Abdomen: soft, non-tender, non-distended, no masses or organomegaly palpable, normoactive bowel sounds Skin: warm, dry, no rashes or lesions, cap refill < 2 seconds Extremities: warm and well perfused, normal tone MSK: 5/5 motor strength on  all 4 extremities, negative straight leg raise bilaterally, negative FABER test bilaterally, mildly tender at SI joint bilaterally, normal gait, no scoliosis, 2+ patellar DTR bilaterally  Assessment & Plan:   Acute lumbar back pain Acute. UA neg. Likely mechanical by history. Does have DDD by lumbar XR in 2016. No red flags. No radiculopathy. Likely muscle strain. Improved with muscle relaxants. Ddx includes spinal stenosis given improvement with bringing knees to chest. --Given refill for Flexeril 10 mg BID PRN pain --RTC in 2 weeks if not improved, would consider PT referral, repeat imaging warranted in 4 weeks if pain persists  Orders Placed This Encounter  Procedures  . POCT urinalysis dipstick  . POCT UA - Microscopic Only   Meds ordered this encounter  Medications  . cyclobenzaprine (FLEXERIL) 10 MG tablet    Sig: Take 1 tablet (10 mg total) by mouth 2 (two) times daily as needed for muscle spasms.    Dispense:  20 tablet    Refill:  0    Harriet Butte, DO [place], PGY-2 06/20/2017 8:17 PM

## 2017-06-20 NOTE — Progress Notes (Signed)
[name]

## 2017-06-20 NOTE — Assessment & Plan Note (Addendum)
Acute. UA neg. Likely mechanical by history. Does have DDD by lumbar XR in 2016. No red flags. No radiculopathy. Likely muscle strain. Improved with muscle relaxants. Ddx includes spinal stenosis given improvement with bringing knees to chest. --Given refill for Flexeril 10 mg BID PRN pain --RTC in 2 weeks if not improved, would consider PT referral, repeat imaging warranted in 4 weeks if pain persists

## 2017-06-20 NOTE — Patient Instructions (Signed)
Thank you for coming in to see [place] today. Please see below to review our plan for today's visit.  I have prescribed you Flexeril for your back pain. Please take as instructed. Do not take your Norco with this medication. If your back pain does not improve in 2 weeks, please return to the clinic. Be sure to stay active and walk for 30 minutes daily.  Please call the clinic at [phone] if your symptoms worsen or you have any concerns. It was my pleasure to see you. -- Harriet Butte, [place], PGY-2

## 2017-09-13 ENCOUNTER — Other Ambulatory Visit: Payer: Self-pay

## 2017-09-13 ENCOUNTER — Encounter: Payer: Self-pay | Admitting: Student

## 2017-09-13 ENCOUNTER — Ambulatory Visit (INDEPENDENT_AMBULATORY_CARE_PROVIDER_SITE_OTHER): Payer: Medicare Other | Admitting: Student

## 2017-09-13 ENCOUNTER — Ambulatory Visit (HOSPITAL_COMMUNITY)
Admission: RE | Admit: 2017-09-13 | Discharge: 2017-09-13 | Disposition: A | Discharge status: Home / Self Care | Payer: Medicare Other | Source: Ambulatory Visit | Attending: Family Medicine | Admitting: Family Medicine

## 2017-09-13 VITALS — BP 132/76 | HR 75 | Temp 97.3°F | Ht 66.0 in | Wt 171.4 lb

## 2017-09-13 DIAGNOSIS — M79671 Pain in right foot: Secondary | ICD-10-CM | POA: Insufficient documentation

## 2017-09-13 DIAGNOSIS — M47819 Spondylosis without myelopathy or radiculopathy, site unspecified: Secondary | ICD-10-CM | POA: Diagnosis not present

## 2017-09-13 DIAGNOSIS — Z23 Encounter for immunization: Secondary | ICD-10-CM

## 2017-09-13 MED ORDER — MELOXICAM 15 MG PO TABS: 15.0000 mg | ORAL_TABLET | Freq: Every day | ORAL | 1 refills | Status: DC

## 2017-09-13 NOTE — Progress Notes (Signed)
Subjective:    [name] is a 62 y.o. old female here right foot pain  HPI Right foot pain: this has been going on for two months. Starts on her heel and now on the top of her foot. No injury or unusual activity. Describes the pain as achy. Comes and goes. No aggravating and alleviating factor. No pain with the first step in the morning.  Tried tylenol without improvement. Also reports occassional cramping in her right leg.  Denies fever, swelling, skin color changes, numbness or tingling.   PMH/Problem List: has Hot flashes, menopausal; Anxiety; Restless leg syndrome; Metatarsalgia of both feet; Acute lumbar back pain; Heavy smoker (more than 20 cigarettes per day); Health maintenance examination; Hordeolum; Distal radius fracture, right; Depression; Herpetic lesion; Intertriginous candidiasis; Lumbar spine pain; Dizziness; Edema; Arthritis of spine; Fall due to wet surface; Trichomonas infection; Healthcare maintenance; Oral herpes; Insomnia; Body aches; Eye pain; Right arm pain; Tachycardia; Vaginal discharge; and Right leg pain on their problem list.   has a past medical history of Allergy, Anxiety (2009), Arthritis (2000), Depression, Fibroid (2003), Hot flashes, menopausal (2012), RLS (restless legs syndrome), Shortness of breath, and Wears glasses.  FH:  Family History  Problem Relation Age of Onset  . Hypertension Mother   . Hyperlipidemia Mother   . Early death Sister 66       Drug overdose  . Dementia Father   . Cancer Father        Throat cancer   . Parkinson's disease Father   . Hypertension Daughter   . Diabetes Daughter   . Cancer Brother        Throat    SH Social History   Tobacco Use  . Smoking status: Current Every Day Smoker    Packs/day: 1.00    Years: 41.00    Pack years: 41.00    Types: Cigarettes    Start date: 04/13/1975  . Smokeless tobacco: Never Used  Substance Use Topics  . Alcohol use: No    Comment: Holidays only  . Drug use: No    Review of  Systems Review of systems negative except for pertinent positives and negatives in history of present illness above.     Objective:     Vitals:   09/13/17 1404  BP: 132/76  Pulse: 75  Temp: (!) 97.3 F (36.3 C)  TempSrc: Oral  SpO2: 99%  Weight: 171 lb 6.4 oz (77.7 kg)  Height: 5\' 6"  (1.676 m)   Body mass index is 27.66 kg/m.  Physical Exam  GEN: appears well, no apparent distress. CVS: DP and PT pulses 2+ bilaterally. RESP: no IWOB MSK:   Ankle:  No visible erythema, swelling or deformity.  Good arch  Some tenderness over the proximal end of right third metatarsal bone. No tenderness over lateral and medial malleolus, base of 5th MT.    Range of motion is full in all directions.  Some pain with resisted foot eversion.  Strength is 5/5 in all directions (dorsal & plantar flexion, abduction & adduction, eversion and inversion). Light sensation intact in all dermatomes. Negative tarsal tunnel tinel's.   No sign of peroneal tendon subluxations or tenderness to palpation  Able to walk 4 steps, no obvious pronation or supination.   SKIN: no apparent skin lesion    Assessment and Plan:  1. Right foot pain: Unclear etiology.  Exam is remarkable for focal tenderness to palpation over the proximal end of right third metatarsal bone.  She also have some pain with  resisted eversion of right foot.  Will obtain foot x-ray to exclude fracture.  Could also be osteoarthritis.  - DG Foot Complete Right; Future to exclude fracture - Refilled her meloxicam -Recommended icing and proper footwear  2. Need for immunization against influenza - Flu Vaccine QUAD 36+ mos IM  Return if symptoms worsen or fail to improve.  Mercy Riding, MD 09/13/17 Pager: [phone]

## 2017-09-13 NOTE — Patient Instructions (Signed)
It was great seeing you today! We have addressed the following issues today  Right foot pain: Unclear about the cause of your pain.  We have ordered an x-ray of your foot to exclude fracture.  You can have this done at [facility] today.  We also refilled your prescription for meloxicam.  Please pick up this prescription and start taking.  I recommend icing for about 50 minutes 3-4 times a day.   If we did any lab work today, and the results require attention, either me or my nurse will get in touch with you. If everything is normal, you will get a letter in mail and a message via . If you don't hear from [place] in two weeks, please give [place] a call. Otherwise, we look forward to seeing you again at your next visit. If you have any questions or concerns before then, please call the clinic at [phone].  Please bring all your medications to every doctors visit  Sign up for My Chart to have easy access to your labs results, and communication with your Primary care physician.    Please check-out at the front desk before leaving the clinic.    Take Care,   Dr. Cyndia Skeeters

## 2017-09-13 NOTE — Progress Notes (Signed)
DG right foot without fracture. Sent result letter to patient.

## 2017-10-18 ENCOUNTER — Other Ambulatory Visit: Payer: Self-pay | Admitting: Family Medicine

## 2017-10-30 ENCOUNTER — Encounter: Payer: Self-pay | Admitting: Student

## 2017-10-30 ENCOUNTER — Other Ambulatory Visit: Payer: Self-pay

## 2017-10-30 ENCOUNTER — Ambulatory Visit (INDEPENDENT_AMBULATORY_CARE_PROVIDER_SITE_OTHER): Payer: Medicare Other | Admitting: Student

## 2017-10-30 VITALS — BP 122/68 | HR 90 | Temp 98.0°F | Ht 66.0 in | Wt 170.0 lb

## 2017-10-30 DIAGNOSIS — Z0001 Encounter for general adult medical examination with abnormal findings: Secondary | ICD-10-CM

## 2017-10-30 DIAGNOSIS — Z1239 Encounter for other screening for malignant neoplasm of breast: Secondary | ICD-10-CM

## 2017-10-30 DIAGNOSIS — F329 Major depressive disorder, single episode, unspecified: Secondary | ICD-10-CM

## 2017-10-30 DIAGNOSIS — Z13220 Encounter for screening for lipoid disorders: Secondary | ICD-10-CM | POA: Diagnosis not present

## 2017-10-30 DIAGNOSIS — Z0000 Encounter for general adult medical examination without abnormal findings: Secondary | ICD-10-CM

## 2017-10-30 DIAGNOSIS — R5383 Other fatigue: Secondary | ICD-10-CM | POA: Insufficient documentation

## 2017-10-30 DIAGNOSIS — L409 Psoriasis, unspecified: Secondary | ICD-10-CM

## 2017-10-30 DIAGNOSIS — Z23 Encounter for immunization: Secondary | ICD-10-CM

## 2017-10-30 DIAGNOSIS — Z1231 Encounter for screening mammogram for malignant neoplasm of breast: Secondary | ICD-10-CM | POA: Diagnosis not present

## 2017-10-30 DIAGNOSIS — Z79899 Other long term (current) drug therapy: Secondary | ICD-10-CM | POA: Diagnosis not present

## 2017-10-30 DIAGNOSIS — F419 Anxiety disorder, unspecified: Secondary | ICD-10-CM | POA: Diagnosis not present

## 2017-10-30 DIAGNOSIS — E785 Hyperlipidemia, unspecified: Secondary | ICD-10-CM | POA: Diagnosis not present

## 2017-10-30 DIAGNOSIS — E559 Vitamin D deficiency, unspecified: Secondary | ICD-10-CM | POA: Diagnosis not present

## 2017-10-30 MED ORDER — CLOBETASOL PROPIONATE 0.05 % EX CREA: 1.0000 "application " | TOPICAL_CREAM | Freq: Two times a day (BID) | CUTANEOUS | 2 refills | Status: DC | PRN

## 2017-10-30 MED ORDER — ZOSTER VAC RECOMB ADJUVANTED 50 MCG/0.5ML IM SUSR: 0.5000 mL | Freq: Once | INTRAMUSCULAR | 1 refills | Status: AC
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
## 2017-10-30 NOTE — Patient Instructions (Addendum)
It was great seeing you today! We have addressed the following issues today  Fatigue: This could be related to your sleep habits.  I recommend trying a sleep hygiene.  Please refer to the handout we gave you for this.  Also doing some blood work to rule out treatable conditions.  Psoriasis: we refilled your clobetasol cream.   Immunization for shingles: please take the prescription we gave you to the pharmacy to have the shingles vaccine.  Breast cancer screening: Please call the number we gave you to schedule for your mammogram.  Please come back and see [place] for you other concerns  If we did any lab work today, and the results require attention, either me or my nurse will get in touch with you. If everything is normal, you will get a letter in mail and a message via . If you don't hear from [place] in two weeks, please give [place] a call. Otherwise, we look forward to seeing you again at your next visit. If you have any questions or concerns before then, please call the clinic at [phone].  Please bring all your medications to every doctors visit  Sign up for My Chart to have easy access to your labs results, and communication with your Primary care physician.    Please check-out at the front desk before leaving the clinic.    Take Care,   Dr. Cyndia Skeeters   Health Maintenance for Postmenopausal Women Menopause is a normal process in which your reproductive ability comes to an end. This process happens gradually over a span of months to years, usually between the ages of 19 and 58. Menopause is complete when you have missed 12 consecutive menstrual periods. It is important to talk with your health care provider about some of the most common conditions that affect postmenopausal women, such as heart disease, cancer, and bone loss (osteoporosis). Adopting a healthy lifestyle and getting preventive care can help to promote your health and wellness. Those actions can also lower your chances of  developing some of these common conditions. What should I know about menopause? During menopause, you may experience a number of symptoms, such as:  Moderate-to-severe hot flashes.  Night sweats.  Decrease in sex drive.  Mood swings.  Headaches.  Tiredness.  Irritability.  Memory problems.  Insomnia.  Choosing to treat or not to treat menopausal changes is an individual decision that you make with your health care provider. What should I know about hormone replacement therapy and supplements? Hormone therapy products are effective for treating symptoms that are associated with menopause, such as hot flashes and night sweats. Hormone replacement carries certain risks, especially as you become older. If you are thinking about using estrogen or estrogen with progestin treatments, discuss the benefits and risks with your health care provider. What should I know about heart disease and stroke? Heart disease, heart attack, and stroke become more likely as you age. This may be due, in part, to the hormonal changes that your body experiences during menopause. These can affect how your body processes dietary fats, triglycerides, and cholesterol. Heart attack and stroke are both medical emergencies. There are many things that you can do to help prevent heart disease and stroke:  Have your blood pressure checked at least every 1-2 years. High blood pressure causes heart disease and increases the risk of stroke.  If you are 31-57 years old, ask your health care provider if you should take aspirin to prevent a heart attack or a stroke.  Do not use any tobacco products, including cigarettes, chewing tobacco, or electronic cigarettes. If you need help quitting, ask your health care provider.  It is important to eat a healthy diet and maintain a healthy weight. ? Be sure to include plenty of vegetables, fruits, low-fat dairy products, and lean protein. ? Avoid eating foods that are high in solid  fats, added sugars, or salt (sodium).  Get regular exercise. This is one of the most important things that you can do for your health. ? Try to exercise for at least 150 minutes each week. The type of exercise that you do should increase your heart rate and make you sweat. This is known as moderate-intensity exercise. ? Try to do strengthening exercises at least twice each week. Do these in addition to the moderate-intensity exercise.  Know your numbers.Ask your health care provider to check your cholesterol and your blood glucose. Continue to have your blood tested as directed by your health care provider.  What should I know about cancer screening? There are several types of cancer. Take the following steps to reduce your risk and to catch any cancer development as early as possible. Breast Cancer  Practice breast self-awareness. ? This means understanding how your breasts normally appear and feel. ? It also means doing regular breast self-exams. Let your health care provider know about any changes, no matter how small.  If you are 41 or older, have a clinician do a breast exam (clinical breast exam or CBE) every year. Depending on your age, family history, and medical history, it may be recommended that you also have a yearly breast X-ray (mammogram).  If you have a family history of breast cancer, talk with your health care provider about genetic screening.  If you are at high risk for breast cancer, talk with your health care provider about having an MRI and a mammogram every year.  Breast cancer (BRCA) gene test is recommended for women who have family members with BRCA-related cancers. Results of the assessment will determine the need for genetic counseling and BRCA1 and for BRCA2 testing. BRCA-related cancers include these types: ? Breast. This occurs in males or females. ? Ovarian. ? Tubal. This may also be called fallopian tube cancer. ? Cancer of the abdominal or pelvic lining  (peritoneal cancer). ? Prostate. ? Pancreatic.  Cervical, Uterine, and Ovarian Cancer Your health care provider may recommend that you be screened regularly for cancer of the pelvic organs. These include your ovaries, uterus, and vagina. This screening involves a pelvic exam, which includes checking for microscopic changes to the surface of your cervix (Pap test).  For women ages 21-65, health care providers may recommend a pelvic exam and a Pap test every three years. For women ages 25-65, they may recommend the Pap test and pelvic exam, combined with testing for human papilloma virus (HPV), every five years. Some types of HPV increase your risk of cervical cancer. Testing for HPV may also be done on women of any age who have unclear Pap test results.  Other health care providers may not recommend any screening for nonpregnant women who are considered low risk for pelvic cancer and have no symptoms. Ask your health care provider if a screening pelvic exam is right for you.  If you have had past treatment for cervical cancer or a condition that could lead to cancer, you need Pap tests and screening for cancer for at least 20 years after your treatment. If Pap tests have been discontinued  for you, your risk factors (such as having a new sexual partner) need to be reassessed to determine if you should start having screenings again. Some women have medical problems that increase the chance of getting cervical cancer. In these cases, your health care provider may recommend that you have screening and Pap tests more often.  If you have a family history of uterine cancer or ovarian cancer, talk with your health care provider about genetic screening.  If you have vaginal bleeding after reaching menopause, tell your health care provider.  There are currently no reliable tests available to screen for ovarian cancer.  Lung Cancer Lung cancer screening is recommended for adults 55-80 years old who are at  high risk for lung cancer because of a history of smoking. A yearly low-dose CT scan of the lungs is recommended if you:  Currently smoke.  Have a history of at least 30 pack-years of smoking and you currently smoke or have quit within the past 15 years. A pack-year is smoking an average of one pack of cigarettes per day for one year.  Yearly screening should:  Continue until it has been 15 years since you quit.  Stop if you develop a health problem that would prevent you from having lung cancer treatment.  Colorectal Cancer  This type of cancer can be detected and can often be prevented.  Routine colorectal cancer screening usually begins at age 50 and continues through age 75.  If you have risk factors for colon cancer, your health care provider may recommend that you be screened at an earlier age.  If you have a family history of colorectal cancer, talk with your health care provider about genetic screening.  Your health care provider may also recommend using home test kits to check for hidden blood in your stool.  A small camera at the end of a tube can be used to examine your colon directly (sigmoidoscopy or colonoscopy). This is done to check for the earliest forms of colorectal cancer.  Direct examination of the colon should be repeated every 5-10 years until age 75. However, if early forms of precancerous polyps or small growths are found or if you have a family history or genetic risk for colorectal cancer, you may need to be screened more often.  Skin Cancer  Check your skin from head to toe regularly.  Monitor any moles. Be sure to tell your health care provider: ? About any new moles or changes in moles, especially if there is a change in a mole's shape or color. ? If you have a mole that is larger than the size of a pencil eraser.  If any of your family members has a history of skin cancer, especially at a young age, talk with your health care provider about genetic  screening.  Always use sunscreen. Apply sunscreen liberally and repeatedly throughout the day.  Whenever you are outside, protect yourself by wearing long sleeves, pants, a wide-brimmed hat, and sunglasses.  What should I know about osteoporosis? Osteoporosis is a condition in which bone destruction happens more quickly than new bone creation. After menopause, you may be at an increased risk for osteoporosis. To help prevent osteoporosis or the bone fractures that can happen because of osteoporosis, the following is recommended:  If you are 19-50 years old, get at least 1,000 mg of calcium and at least 600 mg of vitamin D per day.  If you are older than age 50 but younger than age 70, get   at least 1,200 mg of calcium and at least 600 mg of vitamin D per day.  If you are older than age 55, get at least 1,200 mg of calcium and at least 800 mg of vitamin D per day.  Smoking and excessive alcohol intake increase the risk of osteoporosis. Eat foods that are rich in calcium and vitamin D, and do weight-bearing exercises several times each week as directed by your health care provider. What should I know about how menopause affects my mental health? Depression may occur at any age, but it is more common as you become older. Common symptoms of depression include:  Low or sad mood.  Changes in sleep patterns.  Changes in appetite or eating patterns.  Feeling an overall lack of motivation or enjoyment of activities that you previously enjoyed.  Frequent crying spells.  Talk with your health care provider if you think that you are experiencing depression. What should I know about immunizations? It is important that you get and maintain your immunizations. These include:  Tetanus, diphtheria, and pertussis (Tdap) booster vaccine.  Influenza every year before the flu season begins.  Pneumonia vaccine.  Shingles vaccine.  Your health care provider may also recommend other  immunizations. This information is not intended to replace advice given to you by your health care provider. Make sure you discuss any questions you have with your health care provider. Document Released: 10/21/2005 Document Revised: 03/18/2016 Document Reviewed: 06/02/2015 Elsevier Interactive Patient Education  2018 [employer].

## 2017-10-31 ENCOUNTER — Encounter: Payer: Self-pay | Admitting: Student

## 2017-10-31 DIAGNOSIS — E559 Vitamin D deficiency, unspecified: Secondary | ICD-10-CM

## 2017-10-31 DIAGNOSIS — E785 Hyperlipidemia, unspecified: Secondary | ICD-10-CM

## 2017-10-31 LAB — CMP14+EGFR
A/G RATIO: 1.6 (ref 1.2–2.2)
ALK PHOS: 84 IU/L (ref 39–117)
ALT: 17 IU/L (ref 0–32)
AST: 12 IU/L (ref 0–40)
Albumin: 4.2 g/dL (ref 3.6–4.8)
BILIRUBIN TOTAL: 0.6 mg/dL (ref 0.0–1.2)
BUN / CREAT RATIO: 15 (ref 12–28)
BUN: 15 mg/dL (ref 8–27)
CHLORIDE: 104 mmol/L (ref 96–106)
CO2: 25 mmol/L (ref 20–29)
Calcium: 9.9 mg/dL (ref 8.7–10.3)
Creatinine, Ser: 1.01 mg/dL — ABNORMAL HIGH (ref 0.57–1.00)
GFR calc Af Amer: 69 mL/min/{1.73_m2} (ref >59)
GFR calc non Af Amer: 60 mL/min/{1.73_m2} (ref >59)
GLUCOSE: 72 mg/dL (ref 65–99)
Globulin, Total: 2.6 g/dL (ref 1.5–4.5)
POTASSIUM: 4.1 mmol/L (ref 3.5–5.2)
Sodium: 142 mmol/L (ref 134–144)
Total Protein: 6.8 g/dL (ref 6.0–8.5)

## 2017-10-31 LAB — CBC WITH DIFFERENTIAL/PLATELET
BASOS ABS: 0.1 10*3/uL (ref 0.0–0.2)
Basos: 1 %
EOS (ABSOLUTE): 0.2 10*3/uL (ref 0.0–0.4)
Eos: 2 %
Hematocrit: 39.7 % (ref 34.0–46.6)
Hemoglobin: 13 g/dL (ref 11.1–15.9)
Immature Grans (Abs): 0 10*3/uL (ref 0.0–0.1)
Immature Granulocytes: 0 %
LYMPHS ABS: 2.9 10*3/uL (ref 0.7–3.1)
LYMPHS: 32 %
MCH: 27.5 pg (ref 26.6–33.0)
MCHC: 32.7 g/dL (ref 31.5–35.7)
MCV: 84 fL (ref 79–97)
Monocytes Absolute: 0.6 10*3/uL (ref 0.1–0.9)
Monocytes: 7 %
NEUTROS ABS: 5.3 10*3/uL (ref 1.4–7.0)
Neutrophils: 58 %
PLATELETS: 397 10*3/uL — AB (ref 150–379)
RBC: 4.73 x10E6/uL (ref 3.77–5.28)
RDW: 14 % (ref 12.3–15.4)
WBC: 9 10*3/uL (ref 3.4–10.8)

## 2017-10-31 LAB — LIPID PANEL
CHOLESTEROL TOTAL: 255 mg/dL — AB (ref 100–199)
Chol/HDL Ratio: 6.7 ratio — ABNORMAL HIGH (ref 0.0–4.4)
HDL: 38 mg/dL — ABNORMAL LOW (ref >39)
LDL Calculated: 175 mg/dL — ABNORMAL HIGH (ref 0–99)
Triglycerides: 209 mg/dL — ABNORMAL HIGH (ref 0–149)
VLDL Cholesterol Cal: 42 mg/dL — ABNORMAL HIGH (ref 5–40)

## 2017-10-31 LAB — TSH: TSH: 3.98 u[IU]/mL (ref 0.450–4.500)

## 2017-10-31 LAB — VITAMIN D 25 HYDROXY (VIT D DEFICIENCY, FRACTURES): VIT D 25 HYDROXY: 14.9 ng/mL — AB (ref 30.0–100.0)

## 2017-10-31 MED ORDER — ATORVASTATIN CALCIUM 40 MG PO TABS: 40.0000 mg | ORAL_TABLET | Freq: Every day | ORAL | 3 refills | Status: DC

## 2017-10-31 MED ORDER — VITAMIN D 50 MCG (2000 UT) PO CAPS: 1.0000 | ORAL_CAPSULE | Freq: Every day | ORAL | 11 refills | Status: DC

## 2017-10-31 NOTE — Progress Notes (Signed)
Attempted to call patient twice to discuss about the lab results which is significant for high cholesterol and low vitamin D.  Unfortunately, patient was not able to pick up the phone.  Patient has an ASCVD risk score of 15% (high risk for stroke or heart attack).  High intensity statin is recommended in this situation.  I have sent a prescription for atorvastatin 40 mg daily to her pharmacy. For low vitamin D level, she can get vitamin D 2000 units over-the-counter and take it daily. Routed result letter to admin for mail out.

## 2017-11-06 ENCOUNTER — Other Ambulatory Visit: Payer: Self-pay | Admitting: Student

## 2017-11-06 MED ORDER — HYDROXYZINE PAMOATE 50 MG PO CAPS: 100.0000 mg | ORAL_CAPSULE | Freq: Every evening | ORAL | 2 refills | Status: DC | PRN

## 2017-11-06 NOTE — Telephone Encounter (Signed)
Pt came in office requesting Rx refill for hydrOXYzine, last DOS 10-30-17. If have any questions please call [phone].

## 2017-11-09 ENCOUNTER — Other Ambulatory Visit: Payer: Self-pay

## 2017-11-09 DIAGNOSIS — L409 Psoriasis, unspecified: Secondary | ICD-10-CM

## 2017-11-09 MED ORDER — CLOBETASOL PROPIONATE 0.05 % EX CREA: 1.0000 "application " | TOPICAL_CREAM | Freq: Two times a day (BID) | CUTANEOUS | 2 refills | Status: DC | PRN

## 2017-11-20 ENCOUNTER — Telehealth: Payer: Self-pay

## 2017-11-20 DIAGNOSIS — M79671 Pain in right foot: Secondary | ICD-10-CM

## 2017-11-20 NOTE — Telephone Encounter (Signed)
Pt called nurse line, states she needs a referral to see Dr. Caralyn Guile with [facility]. Pts call back [phone] Wallace Cullens, RN

## 2017-11-20 NOTE — Telephone Encounter (Signed)
Referral to orthopedics surgeon (Dr. Apolonio Schneiders ) ordered.

## 2017-11-24 ENCOUNTER — Other Ambulatory Visit: Payer: Self-pay | Admitting: Student

## 2017-11-27 NOTE — Telephone Encounter (Signed)
referall needs to be for her right hand and wrist. Please correct and resend

## 2017-11-28 NOTE — Telephone Encounter (Signed)
I have not evaluated her for wrist or hand pain. I recommend office visit for referral. Please advise her. Thanks

## 2017-11-30 NOTE — Telephone Encounter (Signed)
Contacted pt and gave her the below information and scheduled her an appointment for 12/11/17. Katharina Caper, Chenika Nevils D, [place]

## 2017-12-11 ENCOUNTER — Ambulatory Visit (INDEPENDENT_AMBULATORY_CARE_PROVIDER_SITE_OTHER): Payer: Medicare Other | Admitting: Student

## 2017-12-11 ENCOUNTER — Other Ambulatory Visit: Payer: Self-pay

## 2017-12-11 ENCOUNTER — Encounter: Payer: Self-pay | Admitting: Student

## 2017-12-11 VITALS — BP 126/70 | HR 81 | Temp 97.9°F | Ht 66.0 in | Wt 170.0 lb

## 2017-12-11 DIAGNOSIS — M25531 Pain in right wrist: Secondary | ICD-10-CM

## 2017-12-11 DIAGNOSIS — M545 Low back pain: Secondary | ICD-10-CM | POA: Diagnosis not present

## 2017-12-11 DIAGNOSIS — G8929 Other chronic pain: Secondary | ICD-10-CM

## 2017-12-11 MED ORDER — CYCLOBENZAPRINE HCL 10 MG PO TABS: 10.0000 mg | ORAL_TABLET | Freq: Two times a day (BID) | ORAL | 0 refills | Status: DC | PRN
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
## 2017-12-11 NOTE — Patient Instructions (Signed)
It was great seeing you today! We have addressed the following issues today  Right wrist pain: We have ordered an x-ray of your right hand.  You can have this done at [facility] today.  Also recommend taking Tylenol every 6 hours for pain.  If we did any lab work today, and the results require attention, either me or my nurse will get in touch with you. If everything is normal, you will get a letter in mail and a message via . If you don't hear from [place] in two weeks, please give [place] a call. Otherwise, we look forward to seeing you again at your next visit. If you have any questions or concerns before then, please call the clinic at [phone].  Please bring all your medications to every doctors visit  Sign up for My Chart to have easy access to your labs results, and communication with your Primary care physician.    Please check-out at the front desk before leaving the clinic.    Take Care,   Dr. Cyndia Skeeters

## 2017-12-13 ENCOUNTER — Ambulatory Visit (HOSPITAL_COMMUNITY)
Admission: RE | Admit: 2017-12-13 | Discharge: 2017-12-13 | Disposition: A | Discharge status: Home / Self Care | Payer: Medicare Other | Source: Ambulatory Visit | Attending: Family Medicine | Admitting: Family Medicine

## 2017-12-13 ENCOUNTER — Other Ambulatory Visit: Payer: Self-pay

## 2017-12-13 DIAGNOSIS — I700 Atherosclerosis of aorta: Secondary | ICD-10-CM | POA: Insufficient documentation

## 2017-12-13 DIAGNOSIS — M545 Low back pain: Secondary | ICD-10-CM | POA: Diagnosis not present

## 2017-12-13 DIAGNOSIS — M79604 Pain in right leg: Secondary | ICD-10-CM | POA: Insufficient documentation

## 2017-12-13 DIAGNOSIS — M25531 Pain in right wrist: Secondary | ICD-10-CM | POA: Diagnosis not present

## 2017-12-13 DIAGNOSIS — M810 Age-related osteoporosis without current pathological fracture: Secondary | ICD-10-CM | POA: Diagnosis not present

## 2017-12-13 DIAGNOSIS — M47816 Spondylosis without myelopathy or radiculopathy, lumbar region: Secondary | ICD-10-CM | POA: Diagnosis not present

## 2017-12-14 ENCOUNTER — Encounter: Payer: Self-pay | Admitting: Student

## 2017-12-14 DIAGNOSIS — E785 Hyperlipidemia, unspecified: Secondary | ICD-10-CM | POA: Insufficient documentation

## 2017-12-14 DIAGNOSIS — E559 Vitamin D deficiency, unspecified: Secondary | ICD-10-CM | POA: Insufficient documentation

## 2017-12-15 MED ORDER — HYDROXYZINE PAMOATE 50 MG PO CAPS: 50.0000 mg | ORAL_CAPSULE | Freq: Every evening | ORAL | 2 refills | Status: DC | PRN

## 2017-12-18 ENCOUNTER — Encounter: Payer: Self-pay | Admitting: Student

## 2017-12-18 ENCOUNTER — Ambulatory Visit
Admission: RE | Admit: 2017-12-18 | Discharge: 2017-12-18 | Disposition: A | Discharge status: Home / Self Care | Payer: Medicare Other | Source: Ambulatory Visit | Attending: Family Medicine | Admitting: Family Medicine

## 2017-12-18 DIAGNOSIS — H1013 Acute atopic conjunctivitis, bilateral: Secondary | ICD-10-CM | POA: Diagnosis not present

## 2017-12-18 DIAGNOSIS — H25813 Combined forms of age-related cataract, bilateral: Secondary | ICD-10-CM | POA: Diagnosis not present

## 2017-12-18 DIAGNOSIS — Z1231 Encounter for screening mammogram for malignant neoplasm of breast: Secondary | ICD-10-CM | POA: Diagnosis not present

## 2017-12-18 DIAGNOSIS — Z1239 Encounter for other screening for malignant neoplasm of breast: Secondary | ICD-10-CM

## 2017-12-18 NOTE — Progress Notes (Signed)
Attempted to call patient about her X-ray. She didn't pick up the phone. Back and arm x-rays didn't show bone fracture, dislocation or other finding that needs urgent or emergent attention. There are some degrees of arthritis. These are usually managed with pain medicine such as Tylenol as needed or every 8 hours.

## 2018-01-01 ENCOUNTER — Ambulatory Visit: Payer: Medicare Other | Admitting: Student

## 2018-01-03 ENCOUNTER — Telehealth: Payer: Self-pay | Admitting: Student

## 2018-01-03 NOTE — Telephone Encounter (Signed)
[facility] told the pt they no longer carry hydr oxine.  The pharmacy suggested Dr call in a substitute Rx. Please advise

## 2018-01-03 NOTE — Telephone Encounter (Signed)
She may try transferring prescription to other pharmacy that carries her medication. If not available, we can increase her trazodone. May someone call patient and let her know. Thanks, Bretta Bang

## 2018-01-10 ENCOUNTER — Ambulatory Visit: Payer: Medicare Other | Admitting: Student

## 2018-01-18 ENCOUNTER — Telehealth: Payer: Self-pay | Admitting: Student

## 2018-01-18 NOTE — Telephone Encounter (Signed)
Patient came to office with issue with RX Hydroxzine pamoate 50mg  capsules. [facility] told patient they are not going to carry RX anymore. Can something else be called in to replace this RX. Please let patient know. [phone]

## 2018-01-19 ENCOUNTER — Other Ambulatory Visit: Payer: Self-pay | Admitting: Student

## 2018-01-19 DIAGNOSIS — G4700 Insomnia, unspecified: Secondary | ICD-10-CM

## 2018-01-19 DIAGNOSIS — F411 Generalized anxiety disorder: Secondary | ICD-10-CM

## 2018-01-19 MED ORDER — HYDROXYZINE HCL 50 MG PO TABS: 50.0000 mg | ORAL_TABLET | Freq: Every evening | ORAL | 2 refills | Status: DC | PRN

## 2018-01-19 NOTE — Telephone Encounter (Signed)
[facility].  They do not have hydroxyzine capsule anymore.  They only have tablet.  I have changed her prescription to tablet and sent to her pharmacy. Please let the patient know. Thank you, Bretta Bang

## 2018-01-19 NOTE — Telephone Encounter (Signed)
LVM for pt to call our office. If pt calls, please let her know that she will be getting tablets in place of capsules. Ottis Stain, CMA

## 2018-02-08 ENCOUNTER — Ambulatory Visit (INDEPENDENT_AMBULATORY_CARE_PROVIDER_SITE_OTHER): Payer: Medicare Other | Admitting: Internal Medicine

## 2018-02-08 ENCOUNTER — Encounter: Payer: Self-pay | Admitting: Internal Medicine

## 2018-02-08 DIAGNOSIS — M545 Low back pain, unspecified: Secondary | ICD-10-CM

## 2018-02-08 DIAGNOSIS — G8929 Other chronic pain: Secondary | ICD-10-CM

## 2018-02-08 MED ORDER — CYCLOBENZAPRINE HCL 10 MG PO TABS: 10.0000 mg | ORAL_TABLET | Freq: Two times a day (BID) | ORAL | 0 refills | Status: DC | PRN

## 2018-02-08 NOTE — Patient Instructions (Signed)
Please take mobic for the next two weeks as this will help with the inflammation. Can use tylenol and flexeril.   Back Exercises If you have pain in your back, do these exercises 2-3 times each day or as told by your doctor. When the pain goes away, do the exercises once each day, but repeat the steps more times for each exercise (do more repetitions). If you do not have pain in your back, do these exercises once each day or as told by your doctor. Exercises Single Knee to Chest  Do these steps 3-5 times in a row for each leg: 1. Lie on your back on a firm bed or the floor with your legs stretched out. 2. Bring one knee to your chest. 3. Hold your knee to your chest by grabbing your knee or thigh. 4. Pull on your knee until you feel a gentle stretch in your lower back. 5. Keep doing the stretch for 10-30 seconds. 6. Slowly let go of your leg and straighten it.  Pelvic Tilt  Do these steps 5-10 times in a row: 1. Lie on your back on a firm bed or the floor with your legs stretched out. 2. Bend your knees so they point up to the ceiling. Your feet should be flat on the floor. 3. Tighten your lower belly (abdomen) muscles to press your lower back against the floor. This will make your tailbone point up to the ceiling instead of pointing down to your feet or the floor. 4. Stay in this position for 5-10 seconds while you gently tighten your muscles and breathe evenly.  Cat-Cow  Do these steps until your lower back bends more easily: 1. Get on your hands and knees on a firm surface. Keep your hands under your shoulders, and keep your knees under your hips. You may put padding under your knees. 2. Let your head hang down, and make your tailbone point down to the floor so your lower back is round like the back of a cat. 3. Stay in this position for 5 seconds. 4. Slowly lift your head and make your tailbone point up to the ceiling so your back hangs low (sags) like the back of a cow. 5. Stay  in this position for 5 seconds.  Press-Ups  Do these steps 5-10 times in a row: 1. Lie on your belly (face-down) on the floor. 2. Place your hands near your head, about shoulder-width apart. 3. While you keep your back relaxed and keep your hips on the floor, slowly straighten your arms to raise the top half of your body and lift your shoulders. Do not use your back muscles. To make yourself more comfortable, you may change where you place your hands. 4. Stay in this position for 5 seconds. 5. Slowly return to lying flat on the floor.  Bridges  Do these steps 10 times in a row: 1. Lie on your back on a firm surface. 2. Bend your knees so they point up to the ceiling. Your feet should be flat on the floor. 3. Tighten your butt muscles and lift your butt off of the floor until your waist is almost as high as your knees. If you do not feel the muscles working in your butt and the back of your thighs, slide your feet 1-2 inches farther away from your butt. 4. Stay in this position for 3-5 seconds. 5. Slowly lower your butt to the floor, and let your butt muscles relax.  If this exercise  is too easy, try doing it with your arms crossed over your chest. Belly Crunches  Do these steps 5-10 times in a row: 1. Lie on your back on a firm bed or the floor with your legs stretched out. 2. Bend your knees so they point up to the ceiling. Your feet should be flat on the floor. 3. Cross your arms over your chest. 4. Tip your chin a little bit toward your chest but do not bend your neck. 5. Tighten your belly muscles and slowly raise your chest just enough to lift your shoulder blades a tiny bit off of the floor. 6. Slowly lower your chest and your head to the floor.  Back Lifts Do these steps 5-10 times in a row: 1. Lie on your belly (face-down) with your arms at your sides, and rest your forehead on the floor. 2. Tighten the muscles in your legs and your butt. 3. Slowly lift your chest off of  the floor while you keep your hips on the floor. Keep the back of your head in line with the curve in your back. Look at the floor while you do this. 4. Stay in this position for 3-5 seconds. 5. Slowly lower your chest and your face to the floor.  Contact a doctor if:  Your back pain gets a lot worse when you do an exercise.  Your back pain does not lessen 2 hours after you exercise. If you have any of these problems, stop doing the exercises. Do not do them again unless your doctor says it is okay. Get help right away if:  You have sudden, very bad back pain. If this happens, stop doing the exercises. Do not do them again unless your doctor says it is okay. This information is not intended to replace advice given to you by your health care provider. Make sure you discuss any questions you have with your health care provider. Document Released: 10/01/2010 Document Revised: 02/04/2016 Document Reviewed: 10/23/2014 Elsevier Interactive Patient Education  [employer].

## 2018-02-08 NOTE — Progress Notes (Signed)
   [name] Kerrin Mo, MD Phone: [phone]  Reason For Visit: SDA for Back Pain  #BACK PAIN  Hx of osteoarthritis  Back pain began 9 days ago. Patient states she was getting out the bed and has severe pain. Patient started taking the muscle relaxer and tylenol. Patient states that she still has a pain back there but not as severe as it use to be.  Pain radiates - does not radiates - stay on the left side  History of trauma or injury: None  Patient believes might be causing their pain: probably due to osteoarthritis   Prior history of similar pain: yes  History of cancer: None  Weak immune system:  None  History of IV drug use: None  History of steroid use: None   Symptoms Incontinence of bowel or bladder: none  Numbness of leg: none  Fever: none  Weight Loss:  None   Past Medical History Reviewed problem list.  Medications- reviewed and updated No additions to family history Social history- patient is a non smoker  Objective: BP 120/75 (BP Location: Left Arm, Patient Position: Sitting, Cuff Size: Normal)   Pulse (!) 101   Temp 97.8 F (36.6 C) (Oral)   Wt 170 lb 9.6 oz (77.4 kg)   SpO2 91%   BMI 27.54 kg/m  Gen: NAD, alert, cooperative with exam MSK: No abnormalities noted on inspection, pain with palpation of paraspinal muscles on the left side, also some pain around the sacroiliac joints, slight increase in plane with flexion compared to extension, neurovascularly intact Skin: dry, intact, no rashes or lesions   Assessment/Plan: See problem based a/p  Chronic bilateral low back pain without sciatica Likely osteoarthritis, has had recent lumbar x-rays which show osteoarthritic changes Continue using Flexeril Patient has Mobic already, explained to her that she should try this for 2 weeks Provided her with back exercises Follow-up in 4 weeks if no improvement

## 2018-02-08 NOTE — Assessment & Plan Note (Addendum)
Likely osteoarthritis, has had recent lumbar x-rays which show osteoarthritic changes Continue using Flexeril Patient has Mobic already, explained to her that she should try this for 2 weeks Provided her with back exercises Follow-up in 4 weeks if no improvement

## 2018-02-22 ENCOUNTER — Other Ambulatory Visit: Payer: Self-pay | Admitting: Student

## 2018-02-22 DIAGNOSIS — M47819 Spondylosis without myelopathy or radiculopathy, site unspecified: Secondary | ICD-10-CM

## 2018-03-19 DIAGNOSIS — H02831 Dermatochalasis of right upper eyelid: Secondary | ICD-10-CM | POA: Diagnosis not present

## 2018-03-19 DIAGNOSIS — H25813 Combined forms of age-related cataract, bilateral: Secondary | ICD-10-CM | POA: Diagnosis not present

## 2018-03-27 ENCOUNTER — Other Ambulatory Visit: Payer: Self-pay

## 2018-03-27 ENCOUNTER — Ambulatory Visit (INDEPENDENT_AMBULATORY_CARE_PROVIDER_SITE_OTHER): Payer: Medicare Other | Admitting: Family Medicine

## 2018-03-27 DIAGNOSIS — M79671 Pain in right foot: Secondary | ICD-10-CM | POA: Diagnosis not present

## 2018-03-27 MED ORDER — NAPROXEN 500 MG PO TABS: 500.0000 mg | ORAL_TABLET | Freq: Two times a day (BID) | ORAL | 0 refills | Status: DC

## 2018-03-27 NOTE — Patient Instructions (Signed)
It was nice meeting you today! You were seen in clinic for right foot pain which is most likely due to overlying irritation from your sandal.  I do not suspect any sprain or tear of your ligaments as your exam was normal.  I would recommend wearing more supportive shoes for the next few days and icing the area.  I have sent in a prescription for an antiinflammatory, Naproxen, to be taken twice daily for the next 7 days.  You can also try elevating your leg to see if that alleviates some of your pain.  If you have new or worsening symptoms, please make an appointment to be seen by a provider.  Be well, Lovenia Kim MD

## 2018-03-27 NOTE — Progress Notes (Addendum)
   Subjective:   Patient ID: [name]    DOB: 06/13/1956, 62 y.o. female   MRN: [id number]  CC: painful right foot   HPI: [name] is a 62 y.o. female who presents to clinic today for the following issue.  Painful R foot  She reports she noticed a "bump" on outer side of her right ankle.  This first started on Saturday when she was at the mall.  She states she was walking around a lot all day. No h/o fall or trauma, does not think she rolled it.  Able to bear weight without issues. No fevers, chills, nausea, vomiting.  Denies leg swelling. Has been taking Tylenol for pain which has helped.    ROS: See HPI for pertinent ROS.  Social: Patient is a current everyday smoker.  Medications reviewed. Objective:   BP 112/68   Pulse 92   Temp 98.2 F (36.8 C) (Oral)   Ht 5\' 6"  (1.676 m)   Wt 174 lb 9.6 oz (79.2 kg)   SpO2 95%   BMI 28.18 kg/m  Vitals and nursing note reviewed.  General: 62 yo female, NAD  CV: regular rate and rhythm without murmurs rubs or gallops Lungs: clear to auscultation bilaterally with normal work of breathing Abdomen: soft, NTND, +bs  Skin: warm, dry, no rash  R foot: No visible erythema or swelling. Range of motion is full in all directions. Strength is 5/5 in all directions. Stable lateral and medial ligaments; squeeze test and kleiger test unremarkable; Talar dome nontender; No pain at base of 5th MT; No tenderness over cuboid; Mild tenderness over lateral malleolus, medial malleolus nontender  No sign of peroneal tendon subluxations; Negative tarsal tunnel tinel's  Able to walk 4 steps.  Extremities: warm and well perfused, normal tone  Assessment & Plan:   Right foot pain Unclear etiology, however likely 2/2 irritation from overlying sandal as there is a small button on the underside of her shoe strap which correlates to the area of tenderness over lateral epicondyle. Exam is otherwise largely normal today, without evidence of sprain,   Do not suspect fracture as no hx of trauma or fall.  -Recommend wearing more supportive shoes preferably sneakers  -Conservative therapy for now with antiinflammatories, rest, ice, elevation -Follow-up if symptoms worsen or do not improve   Lovenia Kim, MD [place], PGY-3

## 2018-03-27 NOTE — Telephone Encounter (Signed)
Requests 90 day supply.  Danley Danker, RN [facility] RN)

## 2018-03-28 MED ORDER — NAPROXEN 500 MG PO TABS: 500.0000 mg | ORAL_TABLET | Freq: Two times a day (BID) | ORAL | 1 refills | Status: DC

## 2018-04-02 DIAGNOSIS — M79671 Pain in right foot: Secondary | ICD-10-CM | POA: Insufficient documentation

## 2018-04-02 NOTE — Assessment & Plan Note (Addendum)
Unclear etiology, however likely 2/2 irritation from overlying sandal as there is a small button on the underside of her shoe strap which correlates to the area of tenderness over lateral epicondyle. Exam is otherwise largely normal today, without evidence of sprain,  Do not suspect fracture as no hx of trauma or fall.  -Recommend wearing more supportive shoes preferably sneakers  -Conservative therapy for now with antiinflammatories, rest, ice, elevation -Follow-up if symptoms worsen or do not improve

## 2018-05-09 ENCOUNTER — Telehealth: Payer: Self-pay | Admitting: Family Medicine

## 2018-05-09 NOTE — Telephone Encounter (Signed)
Pt informed of below. Zimmerman Rumple, Fahd Galea D, CMA  

## 2018-05-09 NOTE — Telephone Encounter (Signed)
Was calling to find out about the bone density test she needs done.

## 2018-05-09 NOTE — Telephone Encounter (Signed)
Patient does not need a bone density scan until she is 46.  I have not yet met this patient, but according to chart review, this has not been mentioned or ordered, so she should wait two more years before getting this scan.

## 2018-05-23 ENCOUNTER — Other Ambulatory Visit: Payer: Self-pay

## 2018-05-23 DIAGNOSIS — M47819 Spondylosis without myelopathy or radiculopathy, site unspecified: Secondary | ICD-10-CM

## 2018-05-23 MED ORDER — MELOXICAM 15 MG PO TABS: ORAL_TABLET | ORAL | 0 refills | Status: DC

## 2018-06-04 ENCOUNTER — Ambulatory Visit: Payer: Medicare Other

## 2018-06-12 NOTE — Progress Notes (Deleted)
  Subjective:   Patient ID: [name]    DOB: 1955-12-28, 61 y.o. female   MRN: [id number]  [name] is a 62 y.o. female with a history of chronic back pain due to arthritis of lumbar spine, RLS, anxiety, depression here for ***  Chronic Low Back Pain - Last seen 01/2018 for same, thought to be due to arthritis at that time. Recommended continued use of flexeril and mobic with back exercises. - lumbar xrays 12/2017 without fracture or spondyolisthesis, consistent with arthritis most notably at L4-5.  - ***  BACK PAIN  Back pain began *** days ago. Pain is described as ***. Patient has tried ***. Pain radiates ***. History of trauma or injury: *** Patient believes might be causing their pain: ***  Prior history of similar pain: *** History of cancer: *** Weak immune system:  *** History of IV drug use: *** History of steroid use: ***  Symptoms Incontinence of bowel or bladder:  *** Numbness of leg: *** Fever: *** Rest or Night pain: *** Weight Loss:  **** Rash: ***  Review of Systems:  Per HPI.  S[place], medications and smoking status reviewed.  Objective:   There were no vitals taken for this visit. Vitals and nursing note reviewed.  General: well nourished, well developed, in no acute distress with non-toxic appearance HEENT: normocephalic, atraumatic, moist mucous membranes Neck: supple, non-tender without lymphadenopathy CV: regular rate and rhythm without murmurs, rubs, or gallops, no lower extremity edema Lungs: clear to auscultation bilaterally with normal work of breathing Abdomen: soft, non-tender, non-distended, no masses or organomegaly palpable, normoactive bowel sounds Skin: warm, dry, no rashes or lesions Extremities: warm and well perfused, normal tone MSK: ROM grossly intact, strength intact, gait normal Neuro: Alert and oriented, speech normal  Assessment & Plan:   No problem-specific Assessment & Plan notes found for this encounter.  No  orders of the defined types were placed in this encounter.  No orders of the defined types were placed in this encounter.   Rory Percy, DO PGY-2, [facility] 06/12/2018 6:18 PM

## 2018-06-13 ENCOUNTER — Ambulatory Visit: Payer: Medicare Other | Admitting: Family Medicine

## 2018-06-27 ENCOUNTER — Ambulatory Visit (INDEPENDENT_AMBULATORY_CARE_PROVIDER_SITE_OTHER): Payer: Medicare Other | Admitting: Family Medicine

## 2018-06-27 ENCOUNTER — Other Ambulatory Visit: Payer: Self-pay

## 2018-06-27 ENCOUNTER — Encounter: Payer: Self-pay | Admitting: Family Medicine

## 2018-06-27 VITALS — BP 126/68 | HR 85 | Temp 97.8°F | Wt 172.8 lb

## 2018-06-27 DIAGNOSIS — G2581 Restless legs syndrome: Secondary | ICD-10-CM | POA: Diagnosis not present

## 2018-06-27 DIAGNOSIS — M545 Low back pain, unspecified: Secondary | ICD-10-CM

## 2018-06-27 DIAGNOSIS — F411 Generalized anxiety disorder: Secondary | ICD-10-CM

## 2018-06-27 DIAGNOSIS — Z23 Encounter for immunization: Secondary | ICD-10-CM

## 2018-06-27 DIAGNOSIS — G8929 Other chronic pain: Secondary | ICD-10-CM

## 2018-06-27 MED ORDER — HYDROXYZINE HCL 50 MG PO TABS: 50.0000 mg | ORAL_TABLET | Freq: Every evening | ORAL | 2 refills | Status: DC | PRN

## 2018-06-27 NOTE — Progress Notes (Signed)
SUBJECTIVE:  PCP: Kathrene Alu, MD Patient ID: MRN [id number]  Date of birth: 16-Jan-1956  HPI [name] is a 62 y.o. female who presents to clinic with chief complaint of back pain. Other complaints today include cramps possibly from gabapentin.   #Back Pain Location: Lower back pain from spine to right hip . Quality "sharp nagging pain".   Timing: last 2-3 weeks. Setting: occurs more so when walking and sometimes when sitting. Warm water makes it feel better. Modifying Factors: tylenol helps sometimes, 1-2 tablets. Worse with activity and especially when she is tilted forward over the sink. In the past, has tried heating pad and rolled towel under back when lying down. Associated: non-painful popping when walking. Patient denies leg weakness and paraesthesia no saddle anaesthesia, no loss of bowel and bladder control. Patient has not done physical therapy for back or legs. No car accidens or recent fall. Pt Concerns: Activities throughout day- clean, driving, currently has not exercised. No scans or imaging.   #Restless Leg Syndrome  Patient was last seen in April 2018 for restless legs and was given prescription for gabapentin 300 mg 3 times daily.  She was given 6 months of refills.  She reports that she is still currently on this medication, however, if taking as prescribed, she would not have enough medication to last until now.  She reports that she may have gotten some from a different provider. She requests Rx refill today. RLS is currently stable. She takes gagbentin daily.   #Anxiety Patient reports recent increased anxiety with her aunt being in hospice care.  She reports that she has episodes of" panic attacks" whenever the topic of death or comes up.  She has not talked to a professional about these issues.  She requests refill of anxiety medication.  She continues to take her Cymbalta.  Review of Symptoms: See HPI  HISTORY Medications & Allergies: Reviewed with patient  and updated in EMR as appropriate.   PMHx:  Patient Active Problem List   Diagnosis Date Noted  . Right foot pain 04/02/2018  . Chronic bilateral low back pain without sciatica 02/08/2018  . Dyslipidemia 12/14/2017  . Vitamin D insufficiency 12/14/2017  . Fatigue 10/30/2017  . Tachycardia 12/28/2016  . Oral herpes 10/19/2015  . Arthritis of spine 03/27/2015  . Lumbar spine pain 01/09/2015  . Depression 07/19/2013  . Acute lumbar back pain 03/30/2012  . Anxiety 02/28/2012    FHx:  family history includes Cancer in her brother and father; Dementia in her father; Diabetes in her daughter; Early death (age of onset: 62) in her sister; Hyperlipidemia in her mother; Hypertension in her daughter and mother; Parkinson's disease in her father.  SHx  reports that she has been smoking cigarettes. She started smoking about 43 years ago. She has a 41.00 pack-year smoking history. She has never used smokeless tobacco. She reports that she does not drink alcohol or use drugs.   OBJECTIVE:  BP 126/68   Pulse 85   Temp 97.8 F (36.6 C) (Oral)   Wt 172 lb 12.8 oz (78.4 kg)   SpO2 97%   BMI 27.89 kg/m   Physical Exam:  Gen: NAD, alert, non-toxic, well-appearing, sitting comfortably  Skin: Warm and dry. No obvious rashes, lesions, or trauma. HEENT: NCAT. PERRLA. No conjunctival pallor or injection. No scleral icterus or injection.  MMM.  CV: RRR.  Normal S1-S2.  RP & DPs 2+ bilaterally. No BLEE. Resp: CTAB.  No wheezing, rales, abnormal  lung sounds.  No increased WOB Abd: NTND on palpation to all 4 quadrants.  Positive bowel sounds. Psych: Cooperative with exam. Pleasant. Makes eye contact. Speech normal. Extremities: Moves all extremities spontaneously  Neuro: CN II-XII grossly intact. No FNDs. Gait: normal, no abnormalities or limp appreciated Back: spine symmetric w/o abnormal curvature. TTP at L4-L5 that radiates laterally to right hip. Straight leg raise negative bilaterally.    Pertinent Labs & Imaging:  April 2019 Dx shows osteoarthritic changes in lower back   ASSESSMENT & PLAN:  Anxiety state Patient reports that aunt is currently in hospice and has been causing her to have great anxiety requiring medication to calm her down.   Refilled hydroxyzine. Patient knows that she can take it during the day but it may make her sleepy. Did not refill trazadone.  Encourage continuation of cymbalta  Lumbar spine pain   Patient complains of acute on chronic back pain that started 2 to 3 weeks ago.  She reports that Tylenol and heat have worked well in the past.  She has not lost any function throughout the day.  She is currently not working. Likely due to osteoarthritis seen in April 2019 scan of lower back.  No recent trauma, falls. Patient does not want any type of surgery and agrees with conservative management moving forward.  Patient's expectations of treatment today include medication to help with pain.  Provided patient with stretching and strengthening back exercises that she is amenable to doing every morning.  Patient agrees that this these exercises will benefit her chronic pain  Did not provide patient with any other medications for back pain.  As Tylenol and heat sores works well.    Patient reports that the Mobic is not working.  Advised patient to discontinue medication.  Restless legs Patient was last seen in April 2018 for restless legs and was given prescription for gabapentin 300 mg 3 times daily.  She was given 6 months of refills.  She reports that she is still currently on this medication, however, if taking as prescribed, she would not have enough medication to last until now.  She reports that she may have gotten some from a different provider.  Advised patient to follow-up with her primary care provider about refilling gabapentin due to the prescription discrepancy and discourage patient from obtaining drugs from different providers as it is likely  to become a controlled substance.  Patient is agreeable to plan  Patient received influenza vaccine today   Zettie Cooley, M.D. [place]  PGY -1 06/27/2018, 2:06 PM

## 2018-06-27 NOTE — Assessment & Plan Note (Signed)
Patient complains of acute on chronic back pain that started 2 to 3 weeks ago.  She reports that Tylenol and heat have worked well in the past.  She has not lost any function throughout the day.  She is currently not working. Likely due to osteoarthritis seen in April 2019 scan of lower back.  No recent trauma, falls. Patient does not want any type of surgery and agrees with conservative management moving forward.  Patient's expectations of treatment today include medication to help with pain.  Provided patient with stretching and strengthening back exercises that she is amenable to doing every morning.  Patient agrees that this these exercises will benefit her chronic pain  Did not provide patient with any other medications for back pain.  As Tylenol and heat sores works well.    Patient reports that the Mobic is not working.  Advised patient to discontinue medication.

## 2018-06-27 NOTE — Patient Instructions (Addendum)
Dear [name],   It was nice to see you today! I am glad you came in for your concerns. This document serves as a "wrap-up" to all that we discussed today and is listed as follows:    Back pain  Stop the mobic as it is not helping.   Continue to take tylenol and use heating elements for break through pain.  Every morning, please do stretches and some exercises (attached) to help with stretching and muscle strengthening.   Anxiety  Refilled your hydroxyzine. You can take this during the day for anxiety, but please remember that it makes you drowsy.    Health Maintenance Thank you for getting your flu shot!    Thank you for choosing [facility] for your primary care needs and stay well!   Best,   Dr. Zettie Cooley Resident Physician, PGY-1 [facility] [phone]    Don't forget to sign up for MyChart for instant access to your health profile, labs, orders, upcoming appointments or to contact your provider with questions. Stop at the front desk on the way out for more information about how to sign up!  Back Pain, Adult Back pain is very common. The pain often gets better over time. The cause of back pain is usually not dangerous. Most people can learn to manage their back pain on their own. Follow these instructions at home: Watch your back pain for any changes. The following actions may help to lessen any pain you are feeling:  Stay active. Start with short walks on flat ground if you can. Try to walk farther each day.  Exercise regularly as told by your doctor. Exercise helps your back heal faster. It also helps avoid future injury by keeping your muscles strong and flexible.  Do not sit, drive, or stand in one place for more than 30 minutes.  Do not stay in bed. Resting more than 1-2 days can slow down your recovery.  Be careful when you bend or lift an object. Use good form when lifting: ? Bend at your knees. ? Keep the object close to  your body. ? Do not twist.  Sleep on a firm mattress. Lie on your side, and bend your knees. If you lie on your back, put a pillow under your knees.  Take medicines only as told by your doctor.  Put ice on the injured area. ? Put ice in a plastic bag. ? Place a towel between your skin and the bag. ? Leave the ice on for 20 minutes, 2-3 times a day for the first 2-3 days. After that, you can switch between ice and heat packs.  Avoid feeling anxious or stressed. Find good ways to deal with stress, such as exercise.  Maintain a healthy weight. Extra weight puts stress on your back.  Contact a doctor if:  You have pain that does not go away with rest or medicine.  You have worsening pain that goes down into your legs or buttocks.  You have pain that does not get better in one week.  You have pain at night.  You lose weight.  You have a fever or chills. Get help right away if:  You cannot control when you poop (bowel movement) or pee (urinate).  Your arms or legs feel weak.  Your arms or legs lose feeling (numbness).  You feel sick to your stomach (nauseous) or throw up (vomit).  You have belly (abdominal) pain.  You feel like you may  pass out (faint). This information is not intended to replace advice given to you by your health care provider. Make sure you discuss any questions you have with your health care provider. Document Released: 02/15/2008 Document Revised: 02/04/2016 Document Reviewed: 12/31/2013 Elsevier Interactive Patient Education  2018 [employer].  Back Exercises The following exercises strengthen the muscles that help to support the back. They also help to keep the lower back flexible. Doing these exercises can help to prevent back pain or lessen existing pain. If you have back pain or discomfort, try doing these exercises 2-3 times each day or as told by your health care provider. When the pain goes away, do them once each day, but increase the number  of times that you repeat the steps for each exercise (do more repetitions). If you do not have back pain or discomfort, do these exercises once each day or as told by your health care provider. Exercises Single Knee to Chest  Repeat these steps 3-5 times for each leg: 1. Lie on your back on a firm bed or the floor with your legs extended. 2. Bring one knee to your chest. Your other leg should stay extended and in contact with the floor. 3. Hold your knee in place by grabbing your knee or thigh. 4. Pull on your knee until you feel a gentle stretch in your lower back. 5. Hold the stretch for 10-30 seconds. 6. Slowly release and straighten your leg.  Pelvic Tilt  Repeat these steps 5-10 times: 1. Lie on your back on a firm bed or the floor with your legs extended. 2. Bend your knees so they are pointing toward the ceiling and your feet are flat on the floor. 3. Tighten your lower abdominal muscles to press your lower back against the floor. This motion will tilt your pelvis so your tailbone points up toward the ceiling instead of pointing to your feet or the floor. 4. With gentle tension and even breathing, hold this position for 5-10 seconds.  Cat-Cow  Repeat these steps until your lower back becomes more flexible: 1. Get into a hands-and-knees position on a firm surface. Keep your hands under your shoulders, and keep your knees under your hips. You may place padding under your knees for comfort. 2. Let your head hang down, and point your tailbone toward the floor so your lower back becomes rounded like the back of a cat. 3. Hold this position for 5 seconds. 4. Slowly lift your head and point your tailbone up toward the ceiling so your back forms a sagging arch like the back of a cow. 5. Hold this position for 5 seconds.  Press-Ups  Repeat these steps 5-10 times: 1. Lie on your abdomen (face-down) on the floor. 2. Place your palms near your head, about shoulder-width apart. 3. While  you keep your back as relaxed as possible and keep your hips on the floor, slowly straighten your arms to raise the top half of your body and lift your shoulders. Do not use your back muscles to raise your upper torso. You may adjust the placement of your hands to make yourself more comfortable. 4. Hold this position for 5 seconds while you keep your back relaxed. 5. Slowly return to lying flat on the floor.  Bridges  Repeat these steps 10 times: 1. Lie on your back on a firm surface. 2. Bend your knees so they are pointing toward the ceiling and your feet are flat on the floor. 3. Tighten your  buttocks muscles and lift your buttocks off of the floor until your waist is at almost the same height as your knees. You should feel the muscles working in your buttocks and the back of your thighs. If you do not feel these muscles, slide your feet 1-2 inches farther away from your buttocks. 4. Hold this position for 3-5 seconds. 5. Slowly lower your hips to the starting position, and allow your buttocks muscles to relax completely.  If this exercise is too easy, try doing it with your arms crossed over your chest. Abdominal Crunches  Repeat these steps 5-10 times: 1. Lie on your back on a firm bed or the floor with your legs extended. 2. Bend your knees so they are pointing toward the ceiling and your feet are flat on the floor. 3. Cross your arms over your chest. 4. Tip your chin slightly toward your chest without bending your neck. 5. Tighten your abdominal muscles and slowly raise your trunk (torso) high enough to lift your shoulder blades a tiny bit off of the floor. Avoid raising your torso higher than that, because it can put too much stress on your low back and it does not help to strengthen your abdominal muscles. 6. Slowly return to your starting position.  Back Lifts Repeat these steps 5-10 times: 1. Lie on your abdomen (face-down) with your arms at your sides, and rest your forehead on  the floor. 2. Tighten the muscles in your legs and your buttocks. 3. Slowly lift your chest off of the floor while you keep your hips pressed to the floor. Keep the back of your head in line with the curve in your back. Your eyes should be looking at the floor. 4. Hold this position for 3-5 seconds. 5. Slowly return to your starting position.  Contact a health care provider if:  Your back pain or discomfort gets much worse when you do an exercise.  Your back pain or discomfort does not lessen within 2 hours after you exercise. If you have any of these problems, stop doing these exercises right away. Do not do them again unless your health care provider says that you can. Get help right away if:  You develop sudden, severe back pain. If this happens, stop doing the exercises right away. Do not do them again unless your health care provider says that you can. This information is not intended to replace advice given to you by your health care provider. Make sure you discuss any questions you have with your health care provider. Document Released: 10/06/2004 Document Revised: 01/06/2016 Document Reviewed: 10/23/2014 Elsevier Interactive Patient Education  2017 Tea your health care provider which exercises are safe for you. Do exercises exactly as told by your health care provider and adjust them as directed. It is normal to feel mild stretching, pulling, tightness, or discomfort as you do these exercises, but you should stop right away if you feel sudden pain or your pain gets worse. Do not begin these exercises until told by your health care provider. Stretching and range of motion exercises These exercises warm up your muscles and joints and improve the movement and flexibility of your hips and your back. These exercises may also help to relieve pain, numbness, and tingling. Exercise A: Single knee to chest  1. Lie on your back on a firm surface with both  legs straight. 2. Bend one of your knees. Use your hands to move your knee up toward your  chest until you feel a gentle stretch in your lower back and buttock. ? Hold your leg in this position by holding onto the front of your knee. ? Keep your other leg as straight as possible. 3. Hold for __________ seconds. 4. Slowly return to the starting position. 5. Repeat this exercise with your other leg. Repeat __________ times. Complete this exercise __________ times a day. Exercise B: Hamstring stretch, supine  1. Lie on your back. 2. Hold both ends of a belt or towel as you loop it over the ball of one of your feet. The ball of your foot is on the walking surface, right under your toes. 3. Straighten your knee and slowly pull on the belt to raise your leg. ? Do not let your knee bend while you do this. ? Keep your other leg flat on the floor. ? Raise the leg until you feel a gentle stretch in the back of your knee or thigh. 4. Hold for __________ seconds. 5. If told by your health care provider, repeat this exercise with your other leg. Repeat __________ times. Complete this exercise __________ times a day. Strengthening exercises These exercises build strength and endurance in your back. Endurance is the ability to use your muscles for a long time, even after they get tired. Exercise C: Pelvic tilt 1. Lie on your back on a firm bed or the floor. Bend your knees and keep your feet flat. 2. Tense your abdominal muscles. Tip your pelvis up toward the ceiling and flatten your lower back into the floor. ? To help with this exercise, you may place a small towel under your lower back and try to push your back into the towel. 3. Hold for __________ seconds. 4. Let your muscles relax completely before you repeat this exercise. Repeat __________ times. Complete this exercise __________ times a day. Exercise D: Abdominal crunch  1. Lie on your back on a firm surface. Bend your knees and keep your feet  flat. Cross your arms over your chest. 2. Tuck your chin down toward your chest, without bending your neck. 3. Use your abdominal muscles to lift your upper body off of the ground, straight up into the air. ? Try to lift yourself until your shoulder blades are off the ground. You may need to work up to this. ? Keep your lower back on the ground while you crunch upward. ? Do not hold your breath. 4. Slowly lower yourself down. Keep your abdominal muscles tense until you are back to the starting position. Repeat __________ times. Complete this exercise __________ times a day. Exercise E: Alternating arm and leg raises  1. Get on your hands and knees on a firm surface. If you are on a hard floor, you may want to use padding to cushion your knees, such as an exercise mat. 2. Line up your arms and legs. Your hands should be below your shoulders, and your knees should be below your hips. 3. Lift your left leg behind you. At the same time, raise your right arm and straighten it in front of you. ? Do not lift your leg higher than your hip. ? Do not lift your arm higher than your shoulder. ? Keep your abdominal and back muscles tight. ? Keep your hips facing the ground. ? Do not arch your back. ? Keep your balance carefully, and do not hold your breath. 4. Hold for __________ seconds. 5. Slowly return to the starting position and repeat with your right leg  and your left arm. Repeat __________ times. Complete this exercise __________ times a day. Posture and body mechanics Body mechanics refers to the movements and positions of your body while you do your daily activities. Posture is part of body mechanics. Good posture and healthy body mechanics can help to relieve stress in your body's tissues and joints. Good posture means that your spine is in its natural S-curve position (your spine is neutral), your shoulders are pulled back slightly, and your head is not tipped forward. The following are general  guidelines for applying improved posture and body mechanics to your everyday activities. Standing   When standing, keep your spine neutral and your feet about hip-width apart. Keep a slight bend in your knees. Your ears, shoulders, and hips should line up with each other.  When you do a task in which you stand in one place for a long time, place one foot up on a stable object that is 2-4 inches (5-10 cm) high, such as a footstool. This helps keep your spine neutral. Sitting   When sitting, keep your spine neutral and keep your feet flat on the floor. Use a footrest, if necessary, and keep your thighs parallel to the floor. Avoid rounding your shoulders, and avoid tilting your head forward.  When working at a desk or a computer, keep your desk at a height where your hands are slightly lower than your elbows. Slide your chair under your desk so you are close enough to maintain good posture.  When working at a computer, place your monitor at a height where you are looking straight ahead and you do not have to tilt your head forward or downward to look at the screen. Resting  When lying down and resting, avoid positions that are most painful for you.  If you have pain with activities such as sitting, bending, stooping, or squatting (flexion-based activities), lie in a position in which your body does not bend very much. For example, avoid curling up on your side with your arms and knees near your chest (fetal position).  If you have pain with activities such as standing for a long time or reaching with your arms (extension-based activities), lie with your spine in a neutral position and bend your knees slightly. Try the following positions: ? Lying on your side with a pillow between your knees. ? Lying on your back with a pillow under your knees.  Lifting   When lifting objects, keep your feet at least shoulder-width apart and tighten your abdominal muscles.  Bend your knees and hips and  keep your spine neutral. It is important to lift using the strength of your legs, not your back. Do not lock your knees straight out.  Always ask for help to lift heavy or awkward objects. This information is not intended to replace advice given to you by your health care provider. Make sure you discuss any questions you have with your health care provider. Document Released: 08/29/2005 Document Revised: 05/05/2016 Document Reviewed: 06/09/2015 Elsevier Interactive Patient Education  [employer].

## 2018-06-27 NOTE — Assessment & Plan Note (Signed)
Patient reports that aunt is currently in hospice and has been causing her to have great anxiety requiring medication to calm her down.   Refilled hydroxyzine. Patient knows that she can take it during the day but it may make her sleepy. Did not refill trazadone.  Encourage continuation of cymbalta

## 2018-06-27 NOTE — Assessment & Plan Note (Addendum)
Advised patient to follow-up with her primary care provider about refilling gabapentin due to the prescription discrepancy and discourage patient from obtaining drugs from different providers as it is likely to become a controlled substance.  Patient is agreeable to plan

## 2018-08-07 ENCOUNTER — Other Ambulatory Visit: Payer: Self-pay

## 2018-08-07 ENCOUNTER — Encounter: Payer: Self-pay | Admitting: Family Medicine

## 2018-08-07 ENCOUNTER — Ambulatory Visit (INDEPENDENT_AMBULATORY_CARE_PROVIDER_SITE_OTHER): Payer: Medicare Other | Admitting: Family Medicine

## 2018-08-07 VITALS — BP 120/74 | HR 98 | Temp 98.0°F | Ht 66.0 in | Wt 171.6 lb

## 2018-08-07 DIAGNOSIS — M545 Low back pain, unspecified: Secondary | ICD-10-CM

## 2018-08-07 DIAGNOSIS — G2581 Restless legs syndrome: Secondary | ICD-10-CM

## 2018-08-07 DIAGNOSIS — G8929 Other chronic pain: Secondary | ICD-10-CM | POA: Diagnosis not present

## 2018-08-07 MED ORDER — ROPINIROLE HCL 0.25 MG PO TABS: 0.2500 mg | ORAL_TABLET | Freq: Every day | ORAL | 2 refills | Status: DC

## 2018-08-07 NOTE — Progress Notes (Signed)
   Subjective:    [name] - 62 y.o. female MRN [id number]  Date of birth: [birth year]  CC  [name] is here for back pain.  HPI:  Back pain - worsening over the past month - takes tylenol to take care of her pain at first and takes naproxen and meloxicam for flares - does not have radiation down her legs - does not feel that a cane would be very helpful currently - has some balance issues and fell last month - would like a handicap sticker to help ease her difficulty of walking  Restless leg - has been taking gabapentin and does not feel that it is currently helpful - has this problem nightly  Social stressors -Her husband recently had a stroke and she is currently adjusting to his new neurological status and is providing care for him, so she is under significant stress.  She does say that it is slowly getting easier for her.   Health Maintenance:   There are no preventive care reminders to display for this patient.  -  reports that she has been smoking cigarettes. She started smoking about 43 years ago. She has a 41.00 pack-year smoking history. She has never used smokeless tobacco. - Review of Systems: Per HPI. - Past Medical History: Patient Active Problem List   Diagnosis Date Noted  . Right foot pain 04/02/2018  . Chronic bilateral low back pain without sciatica 02/08/2018  . Dyslipidemia 12/14/2017  . Vitamin D insufficiency 12/14/2017  . Fatigue 10/30/2017  . Tachycardia 12/28/2016  . Oral herpes 10/19/2015  . Arthritis of spine 03/27/2015  . Lumbar spine pain 01/09/2015  . Depression 07/19/2013  . Acute lumbar back pain 03/30/2012  . Anxiety state 02/28/2012  . Restless legs 02/28/2012   - Medications: reviewed and updated   Objective:   Physical Exam BP 120/74   Pulse 98   Temp 98 F (36.7 C) (Oral)   Ht 5\' 6"  (1.676 m)   Wt 171 lb 9.6 oz (77.8 kg)   SpO2 98%   BMI 27.70 kg/m  Gen: NAD, alert, cooperative with exam, well-appearing CV:  RRR, good S1/S2, no murmur Resp: CTABL, no wheezes, non-labored Musculoskeletal: Tender to palpation along the spine paraspinal areas of her lumbar spine, no point tenderness along spinal column, normal gait Neuro: no gross deficits, straight leg test negative Psych: tearful when discussing husband's stroke       Assessment & Plan:   Chronic bilateral low back pain without sciatica Reviewed using Tylenol for pain management rather than daily NSAIDs.  Filled out patient's paperwork to provide her a temporary handicap sticker.  Restless legs We will try ropinirole 0.25 mg with patient advised to call and let me know in 1 month if this dose has helped her or not.  We can consider increasing the dose at that time if she is amenable.    Maia Breslow, M.D. 08/10/2018, 6:41 PM PGY-2, [place]

## 2018-08-07 NOTE — Patient Instructions (Signed)
It was nice meeting you today [name]!  I filled out your paperwork for a parking placard.  We also started a medication called ropinirole or Requip for your restless leg, which you'll take instead of the gabapentin.  Please call our clinic if it is not helpful for you after trying it for a few weeks.  Please make another appointment if you need at anytime.  If you have any questions or concerns, please feel free to call the clinic.   Be well,  Dr. Shan Levans

## 2018-08-10 NOTE — Assessment & Plan Note (Signed)
Reviewed using Tylenol for pain management rather than daily NSAIDs.  Filled out patient's paperwork to provide her a temporary handicap sticker.

## 2018-08-10 NOTE — Assessment & Plan Note (Signed)
We will try ropinirole 0.25 mg with patient advised to call and let me know in 1 month if this dose has helped her or not.  We can consider increasing the dose at that time if she is amenable.

## 2018-08-21 ENCOUNTER — Other Ambulatory Visit: Payer: Self-pay | Admitting: Family Medicine

## 2018-08-21 DIAGNOSIS — M47819 Spondylosis without myelopathy or radiculopathy, site unspecified: Secondary | ICD-10-CM

## 2018-10-26 ENCOUNTER — Ambulatory Visit (INDEPENDENT_AMBULATORY_CARE_PROVIDER_SITE_OTHER): Payer: Medicare Other | Admitting: Family Medicine

## 2018-10-26 VITALS — BP 127/80 | HR 80 | Temp 97.8°F | Wt 172.2 lb

## 2018-10-26 DIAGNOSIS — L723 Sebaceous cyst: Secondary | ICD-10-CM

## 2018-10-26 NOTE — Patient Instructions (Signed)
It was great meeting you today!  That the area on your left flank is something called a sebaceous cyst.  This is essentially a pocket filled with hair follicle, fat, various oils your body makes, and other stuff.  It will likely continue to grow and for this reason I think getting it removed is a good idea.  We have a dermatology clinic here in our clinic to handle simple procedures like this.  On the way out please asked the front desk to schedule you an appointment in dermatology clinic.  Fortunately this is nothing serious and there is no immediate treatment necessary.

## 2018-10-29 ENCOUNTER — Encounter: Payer: Self-pay | Admitting: Family Medicine

## 2018-10-29 DIAGNOSIS — L723 Sebaceous cyst: Principal | ICD-10-CM

## 2018-10-29 DIAGNOSIS — L720 Epidermal cyst: Secondary | ICD-10-CM | POA: Insufficient documentation

## 2018-10-29 HISTORY — DX: Epidermal cyst: L72.0

## 2018-10-29 NOTE — Progress Notes (Signed)
   HPI 63 year old African-American female who presents for a spot on her left flank that is getting "harder".  Patient states that this area is not painful but is irritating as it rubs up against her clothes frequently.  Upon chart the patient had to have a I&D the of abscess in this area around 2013.  She initially had scar tissue in that area secondary to an unknown procedure.  She thinks that it feels very similar to this.  She has done nothing to relieve the pain is tried nothing that makes it worse.  She states that she has a similar spot on her chest wall in between her breasts.  CC: Hard spot on left flank   ROS:   Review of Systems See HPI for ROS.   CC, SH/smoking status, and VS noted  Objective: BP 127/80   Pulse 80   Temp 97.8 F (36.6 C) (Oral)   Wt 172 lb 3.2 oz (78.1 kg)   SpO2 98%   BMI 27.79 kg/m  Gen: No acute distress, resting comfortably, very pleasant CV: RRR, no murmur Resp: CTAB, no wheezes, non-labored Neuro: Alert and oriented, Speech clear, No gross deficits Left flank: 2 x 1 cm raised, nodular structure, no tenderness to palpation, area of central hyperpigmentation. Chest wall between breasts: 0.5 x 1 cm raised circular lesion, no tenderness, with area of central hypopigmentation      Assessment and plan:  Sebaceous cyst Findings consistent with a sebaceous cyst both her left flank and between her breasts.  Likely will need excisional removal.  Will refer to dermatology clinic at family practice for removal.   No orders of the defined types were placed in this encounter.   No orders of the defined types were placed in this encounter.    Guadalupe Dawn MD PGY-2 Family Medicine Resident  10/29/2018 10:10 AM

## 2018-10-29 NOTE — Assessment & Plan Note (Signed)
Findings consistent with a sebaceous cyst both her left flank and between her breasts.  Likely will need excisional removal.  Will refer to dermatology clinic at family practice for removal.

## 2018-11-05 ENCOUNTER — Other Ambulatory Visit: Payer: Self-pay

## 2018-11-05 DIAGNOSIS — M545 Low back pain: Principal | ICD-10-CM

## 2018-11-05 DIAGNOSIS — G8929 Other chronic pain: Secondary | ICD-10-CM

## 2018-11-07 MED ORDER — CYCLOBENZAPRINE HCL 10 MG PO TABS: 10.0000 mg | ORAL_TABLET | Freq: Two times a day (BID) | ORAL | 0 refills | Status: DC | PRN

## 2018-11-09 ENCOUNTER — Other Ambulatory Visit: Payer: Self-pay | Admitting: Family Medicine

## 2018-11-09 DIAGNOSIS — Z1231 Encounter for screening mammogram for malignant neoplasm of breast: Secondary | ICD-10-CM

## 2018-11-18 ENCOUNTER — Other Ambulatory Visit: Payer: Self-pay | Admitting: Family Medicine

## 2018-11-20 ENCOUNTER — Other Ambulatory Visit: Payer: Self-pay | Admitting: Family Medicine

## 2018-11-20 ENCOUNTER — Other Ambulatory Visit: Payer: Self-pay

## 2018-11-20 DIAGNOSIS — M47819 Spondylosis without myelopathy or radiculopathy, site unspecified: Secondary | ICD-10-CM

## 2018-11-20 MED ORDER — MELOXICAM 15 MG PO TABS: 15.0000 mg | ORAL_TABLET | Freq: Every day | ORAL | 0 refills | Status: DC | PRN

## 2018-12-17 ENCOUNTER — Other Ambulatory Visit: Payer: Self-pay | Admitting: *Deleted

## 2018-12-17 DIAGNOSIS — F411 Generalized anxiety disorder: Secondary | ICD-10-CM

## 2018-12-17 MED ORDER — HYDROXYZINE HCL 50 MG PO TABS: 50.0000 mg | ORAL_TABLET | Freq: Every evening | ORAL | 2 refills | Status: DC | PRN

## 2018-12-20 ENCOUNTER — Ambulatory Visit: Payer: Medicare Other

## 2018-12-27 ENCOUNTER — Ambulatory Visit: Payer: Medicare Other

## 2018-12-31 ENCOUNTER — Ambulatory Visit: Payer: Medicare Other

## 2019-01-09 ENCOUNTER — Other Ambulatory Visit: Payer: Self-pay | Admitting: *Deleted

## 2019-01-09 DIAGNOSIS — E785 Hyperlipidemia, unspecified: Secondary | ICD-10-CM

## 2019-01-09 MED ORDER — ATORVASTATIN CALCIUM 40 MG PO TABS: 40.0000 mg | ORAL_TABLET | Freq: Every day | ORAL | 3 refills | Status: DC

## 2019-02-14 ENCOUNTER — Other Ambulatory Visit: Payer: Self-pay

## 2019-02-14 DIAGNOSIS — F411 Generalized anxiety disorder: Secondary | ICD-10-CM

## 2019-02-14 MED ORDER — HYDROXYZINE HCL 50 MG PO TABS: 50.0000 mg | ORAL_TABLET | Freq: Every evening | ORAL | 2 refills | Status: DC | PRN

## 2019-02-22 ENCOUNTER — Ambulatory Visit: Payer: Medicare Other

## 2019-03-06 ENCOUNTER — Ambulatory Visit: Payer: Medicare Other

## 2019-03-12 ENCOUNTER — Ambulatory Visit (INDEPENDENT_AMBULATORY_CARE_PROVIDER_SITE_OTHER): Payer: Medicare Other | Admitting: Student in an Organized Health Care Education/Training Program

## 2019-03-12 ENCOUNTER — Encounter

## 2019-03-12 ENCOUNTER — Other Ambulatory Visit: Payer: Self-pay

## 2019-03-12 DIAGNOSIS — S90122A Contusion of left lesser toe(s) without damage to nail, initial encounter: Secondary | ICD-10-CM | POA: Insufficient documentation

## 2019-03-12 NOTE — Assessment & Plan Note (Signed)
Neurovascularly intact. Toe does not appear to be broken on exam. Ecchymosis over the tip of the toe with mild tenderness to palpation. Patient is able to move the toe normally. - buddy taped 3rd toe with 2nd for support using ace bandage  - return precautions - rest, ice, elevation - monitor for skin breakdown - expect improvement in next 2-4 weeks

## 2019-03-12 NOTE — Patient Instructions (Addendum)
It was a pleasure seeing you today in our clinic.   Rest Ice Elevation  You may buddy tape the toe during the day for extra support. Take the tape off at night. Watch your feet for ulcers.  Return to clinic if symptoms worsen or fail to improve in the next 2-4 weeks.  Our clinic's number is [phone]. Please call with questions or concerns about what we discussed today.  Be well, Dr. Burr Medico

## 2019-03-12 NOTE — Progress Notes (Signed)
   CC: Left 3rd toe injury  HPI: [name] is a 63 y.o. female with PMH significant for chronic back pain, depression, oral herpes who presents to [facility] today with left 3rd toe pain  of 4 days duration.    The patient was in her usual state of health until Saturday (4 days ago) when she dropped her phone and it landed on the tip of her left 3rd toe. The corner of her phone hit her toe. She had pain immediately after the injury and noted bruising that has persisted and travelled up the toe slightly over the past few days. She has had no difficulty walking on the toe, but prefers open-toed shoes due to discomfort. She has minimal pain in the toe. She has never had similar injury in the past. She has been using ice on the toe.  Review of Symptoms:  See HPI for ROS.   CC, SH/smoking status, and VS noted.  Objective: BP 128/68   Pulse (!) 101   SpO2 97%  GEN: NAD, alert, cooperative, and pleasant. RESPIRATORY: Comfortable work of breathing, speaks in full sentences CV: Regular rate noted, distal extremities well perfused and warm without edema GI: Soft, nondistended SKIN: warm and dry, no rashes or lesions NEURO: II-XII grossly intact MSK: +ecchymosis over tip of 3rd left toe. Mild ttp. No gross deformity. Normal warmth. Neurovascularly intact. Motor function intact. Other toes appear normal. PSYCH: AAOx3, appropriate affect   Flu VAssessment and plan:  Traumatic ecchymosis of toe of left foot Neurovascularly intact. Toe does not appear to be broken on exam. Ecchymosis over the tip of the toe with mild tenderness to palpation. Patient is able to move the toe normally. - buddy taped 3rd toe with 2nd for support using ace bandage  - return precautions - rest, ice, elevation - monitor for skin breakdown - expect improvement in next 2-4 weeks  Everrett Coombe, MD,MS,  PGY3 03/12/2019 11:07 AM

## 2019-03-26 ENCOUNTER — Encounter: Payer: Self-pay | Admitting: Family Medicine

## 2019-03-26 ENCOUNTER — Ambulatory Visit (INDEPENDENT_AMBULATORY_CARE_PROVIDER_SITE_OTHER): Payer: Medicare Other | Admitting: Family Medicine

## 2019-03-26 ENCOUNTER — Other Ambulatory Visit: Payer: Self-pay

## 2019-03-26 VITALS — BP 110/78 | HR 93 | Temp 98.1°F | Wt 171.0 lb

## 2019-03-26 DIAGNOSIS — Z5181 Encounter for therapeutic drug level monitoring: Secondary | ICD-10-CM

## 2019-03-26 DIAGNOSIS — M79641 Pain in right hand: Secondary | ICD-10-CM

## 2019-03-26 DIAGNOSIS — L720 Epidermal cyst: Secondary | ICD-10-CM

## 2019-03-26 DIAGNOSIS — Z791 Long term (current) use of non-steroidal anti-inflammatories (NSAID): Secondary | ICD-10-CM | POA: Diagnosis not present

## 2019-03-26 DIAGNOSIS — R5383 Other fatigue: Secondary | ICD-10-CM

## 2019-03-26 DIAGNOSIS — E785 Hyperlipidemia, unspecified: Secondary | ICD-10-CM | POA: Diagnosis not present

## 2019-03-26 DIAGNOSIS — M545 Low back pain: Secondary | ICD-10-CM | POA: Diagnosis not present

## 2019-03-26 DIAGNOSIS — G8929 Other chronic pain: Secondary | ICD-10-CM | POA: Insufficient documentation

## 2019-03-26 MED ORDER — CITALOPRAM HYDROBROMIDE 10 MG PO TABS: 10.0000 mg | ORAL_TABLET | Freq: Every day | ORAL | 3 refills | Status: DC

## 2019-03-26 MED ORDER — GABAPENTIN 600 MG PO TABS: 600.0000 mg | ORAL_TABLET | Freq: Three times a day (TID) | ORAL | 3 refills | Status: DC

## 2019-03-26 MED ORDER — CYCLOBENZAPRINE HCL 10 MG PO TABS: 10.0000 mg | ORAL_TABLET | Freq: Two times a day (BID) | ORAL | 0 refills | Status: DC | PRN
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
## 2019-03-26 NOTE — Assessment & Plan Note (Signed)
We will schedule patient for an appointment on 7/16 at our dermatology clinic for her epidermoid cyst to be removed.  We will also be able to remove the lesion on patient's chest if appropriate.  This lesion may need to be sent to pathology.

## 2019-03-26 NOTE — Patient Instructions (Signed)
It was nice seeing you today [name]!  We are checking a variety of labs today, and we will let you know what those results are when they return.  We are starting gabapentin 600 mg up to 3 times per day for your hand pain.  Please stop the naproxen and take meloxicam intermittently, since taking it every day can be harmful for your stomach and kidneys.  We can always increase your gabapentin dose in the future if needed.  We are scheduling you for your cyst removals on 7/16 at 3:30 PM.  If you have any questions or concerns, please feel free to call the clinic.   Be well,  Dr. Shan Levans

## 2019-03-26 NOTE — Assessment & Plan Note (Signed)
Counseled patient that she should not take meloxicam and naproxen at the same time and she should take neither of these medications every day.  We decided that she will stop the naproxen and continue the meloxicam only intermittently.  Will obtain a BMP today to assess kidney function.  Will add gabapentin 600 mg 3 times daily PRN to help address any neuropathic pain that has resulted from this injury.  Patient was told that we can increase this dose in time if needed.

## 2019-03-26 NOTE — Assessment & Plan Note (Signed)
Patient's total and LDL cholesterol were elevated about 1 year ago, and she was started on Lipitor soon after these results returned.  We will recheck lipid panel today.

## 2019-03-27 LAB — BASIC METABOLIC PANEL
BUN/Creatinine Ratio: 15 (ref 12–28)
BUN: 21 mg/dL (ref 8–27)
CO2: 25 mmol/L (ref 20–29)
Calcium: 9.9 mg/dL (ref 8.7–10.3)
Chloride: 105 mmol/L (ref 96–106)
Creatinine, Ser: 1.36 mg/dL — ABNORMAL HIGH (ref 0.57–1.00)
GFR calc Af Amer: 47 mL/min/{1.73_m2} — ABNORMAL LOW (ref >59)
GFR calc non Af Amer: 41 mL/min/{1.73_m2} — ABNORMAL LOW (ref >59)
Glucose: 100 mg/dL — ABNORMAL HIGH (ref 65–99)
Potassium: 4.5 mmol/L (ref 3.5–5.2)
Sodium: 146 mmol/L — ABNORMAL HIGH (ref 134–144)

## 2019-03-27 LAB — LIPID PANEL
Chol/HDL Ratio: 2.6 ratio (ref 0.0–4.4)
Cholesterol, Total: 125 mg/dL (ref 100–199)
HDL: 49 mg/dL (ref >39)
LDL Calculated: 58 mg/dL (ref 0–99)
Triglycerides: 91 mg/dL (ref 0–149)
VLDL Cholesterol Cal: 18 mg/dL (ref 5–40)

## 2019-03-27 LAB — CBC
Hematocrit: 41.5 % (ref 34.0–46.6)
Hemoglobin: 13.3 g/dL (ref 11.1–15.9)
MCH: 27.3 pg (ref 26.6–33.0)
MCHC: 32 g/dL (ref 31.5–35.7)
MCV: 85 fL (ref 79–97)
Platelets: 346 10*3/uL (ref 150–450)
RBC: 4.87 x10E6/uL (ref 3.77–5.28)
RDW: 13 % (ref 11.7–15.4)
WBC: 9.7 10*3/uL (ref 3.4–10.8)

## 2019-03-28 ENCOUNTER — Ambulatory Visit: Payer: Medicare Other | Admitting: Family Medicine

## 2019-03-28 ENCOUNTER — Other Ambulatory Visit: Payer: Self-pay

## 2019-03-28 ENCOUNTER — Ambulatory Visit (INDEPENDENT_AMBULATORY_CARE_PROVIDER_SITE_OTHER): Payer: Medicare Other | Admitting: Family Medicine

## 2019-03-28 VITALS — BP 114/62 | HR 90

## 2019-03-28 DIAGNOSIS — L720 Epidermal cyst: Secondary | ICD-10-CM | POA: Diagnosis not present

## 2019-03-28 NOTE — Patient Instructions (Signed)
It was nice seeing you today. We were able to get your cyst out to include the capsule. You have two stitches on the wound on the left side of your hip. Please keep area clean and dry and return in 7-10 days for suture removal.    Wound Care, Adult Taking care of your wound properly can help to prevent pain, infection, and scarring. It can also help your wound to heal more quickly. How to care for your wound Wound care      Follow instructions from your health care provider about how to take care of your wound. Make sure you: ? Wash your hands with soap and water before you change the bandage (dressing). If soap and water are not available, use hand sanitizer. ? Change your dressing as told by your health care provider. ? Leave stitches (sutures), skin glue, or adhesive strips in place. These skin closures may need to stay in place for 2 weeks or longer. If adhesive strip edges start to loosen and curl up, you may trim the loose edges. Do not remove adhesive strips completely unless your health care provider tells you to do that.  Check your wound area every day for signs of infection. Check for: ? Redness, swelling, or pain. ? Fluid or blood. ? Warmth. ? Pus or a bad smell.  Ask your health care provider if you should clean the wound with mild soap and water. Doing this may include: ? Using a clean towel to pat the wound dry after cleaning it. Do not rub or scrub the wound. ? Applying a cream or ointment. Do this only as told by your health care provider. ? Covering the incision with a clean dressing.  Ask your health care provider when you can leave the wound uncovered.  Keep the dressing dry until your health care provider says it can be removed. Do not take baths, swim, use a hot tub, or do anything that would put the wound underwater until your health care provider approves. Ask your health care provider if you can take showers. You may only be allowed to take sponge baths.  Medicines   If you were prescribed an antibiotic medicine, cream, or ointment, take or use the antibiotic as told by your health care provider. Do not stop taking or using the antibiotic even if your condition improves.  Take over-the-counter and prescription medicines only as told by your health care provider. If you were prescribed pain medicine, take it 30 or more minutes before you do any wound care or as told by your health care provider. General instructions  Return to your normal activities as told by your health care provider. Ask your health care provider what activities are safe.  Do not scratch or pick at the wound.  Do not use any products that contain nicotine or tobacco, such as cigarettes and e-cigarettes. These may delay wound healing. If you need help quitting, ask your health care provider.  Keep all follow-up visits as told by your health care provider. This is important.  Eat a diet that includes protein, vitamin A, vitamin C, and other nutrient-rich foods to help the wound heal. ? Foods rich in protein include meat, dairy, beans, nuts, and other sources. ? Foods rich in vitamin A include carrots and dark green, leafy vegetables. ? Foods rich in vitamin C include citrus, tomatoes, and other fruits and vegetables. ? Nutrient-rich foods have protein, carbohydrates, fat, vitamins, or minerals. Eat a variety of healthy foods including  vegetables, fruits, and whole grains. Contact a health care provider if:  You received a tetanus shot and you have swelling, severe pain, redness, or bleeding at the injection site.  Your pain is not controlled with medicine.  You have redness, swelling, or pain around the wound.  You have fluid or blood coming from the wound.  Your wound feels warm to the touch.  You have pus or a bad smell coming from the wound.  You have a fever or chills.  You are nauseous or you vomit.  You are dizzy. Get help right away if:  You have a  red streak going away from your wound.  The edges of the wound open up and separate.  Your wound is bleeding, and the bleeding does not stop with gentle pressure.  You have a rash.  You faint.  You have trouble breathing. Summary  Always wash your hands with soap and water before changing your bandage (dressing).  To help with healing, eat foods that are rich in protein, vitamin A, vitamin C, and other nutrients.  Check your wound every day for signs of infection. Contact your health care provider if you suspect that your wound is infected. This information is not intended to replace advice given to you by your health care provider. Make sure you discuss any questions you have with your health care provider. Document Released: 06/07/2008 Document Revised: 12/17/2018 Document Reviewed: 03/15/2016 Elsevier Patient Education  2020 [employer].

## 2019-03-28 NOTE — Progress Notes (Signed)
Subjective:  [name] is a 63 y.o. female who presents to the [facility] today with a chief complaint of cyst on chest and left hip/lower abd area.   HPI:   He was referred by Dr. Kris Mouton for cyst on her chest and left hip area ongoing for many years. The left hi cyst increased in size over the years. Denies pain today. She will like to get both cyst removed.  Physical Exam Vitals signs and nursing note reviewed.  Constitutional:      Appearance: Normal appearance.  Pulmonary:     Effort: Pulmonary effort is normal. No respiratory distress.  Skin:      Neurological:     Mental Status: She is alert.       Results for orders placed or performed in visit on 03/26/19 (from the past 72 hour(s))  Lipid Panel     Status: None   Collection Time: 03/26/19 11:10 AM  Result Value Ref Range   Cholesterol, Total 125 100 - 199 mg/dL   Triglycerides 91 0 - 149 mg/dL   HDL 49 >39 mg/dL   VLDL Cholesterol Cal 18 5 - 40 mg/dL   LDL Calculated 58 0 - 99 mg/dL   Chol/HDL Ratio 2.6 0.0 - 4.4 ratio    Comment:                                   T. Chol/HDL Ratio                                             Men  Women                               1/2 Avg.Risk  3.4    3.3                                   Avg.Risk  5.0    4.4                                2X Avg.Risk  9.6    7.1                                3X Avg.Risk 23.4   54.2   Basic Metabolic Panel     Status: Abnormal   Collection Time: 03/26/19 11:10 AM  Result Value Ref Range   Glucose 100 (H) 65 - 99 mg/dL   BUN 21 8 - 27 mg/dL   Creatinine, Ser 1.36 (H) 0.57 - 1.00 mg/dL   GFR calc non Af Amer 41 (L) >59 mL/min/1.73   GFR calc Af Amer 47 (L) >59 mL/min/1.73   BUN/Creatinine Ratio 15 12 - 28   Sodium 146 (H) 134 - 144 mmol/L   Potassium 4.5 3.5 - 5.2 mmol/L   Chloride 105 96 - 106 mmol/L   CO2 25 20 - 29 mmol/L   Calcium 9.9 8.7 - 10.3 mg/dL  CBC     Status: None   Collection Time: 03/26/19 11:10 AM  Result Value  Ref Range   WBC 9.7 3.4 - 10.8 x10E3/uL   RBC 4.87 3.77 - 5.28 x10E6/uL   Hemoglobin 13.3 11.1 - 15.9 g/dL   Hematocrit 41.5 34.0 - 46.6 %   MCV 85 79 - 97 fL   MCH 27.3 26.6 - 33.0 pg   MCHC 32.0 31.5 - 35.7 g/dL   RDW 13.0 11.7 - 15.4 %   Platelets 346 150 - 450 x10E3/uL     Assessment/Plan:   Epidermoid Cyst. Cyst excision and enucleation recommended. She consented verbally and in writing to the procedure.  PROCEDURE  PRE-OP DIAGNOSIS: Epidermoid Cyst  POST-OP DIAGNOSIS: Same   PROCEDURE: Cyst removal  Performing Physician: Andrena Mews  PROCEDURE:  Anesthesia: X_  1%  Lidocaine +   X_ epinephrine   Procedure: Timeout procedure was performed prior to initiating procedure to be sure of right patient and right location.  The area surrounding the skin lesion was prepared and draped in the usual sterile manner and then was anesthetized. Skin incision was made in a linear fashion and cyst was excised bluntly using small forceps.  Hemostasis was assured. The patient tolerated the procedure well.   Closure: Suture ( 2 stiches on her left L abdomen)        .Steri strips applied to the incision on her chest wall.   Followup: The patient tolerated the procedure well without complications.  Standard post-procedure care is explained and return precautions are given.  Suture removal in about 10 days.

## 2019-04-09 ENCOUNTER — Encounter: Payer: Self-pay | Admitting: Family Medicine

## 2019-04-09 ENCOUNTER — Ambulatory Visit (INDEPENDENT_AMBULATORY_CARE_PROVIDER_SITE_OTHER): Payer: Medicare Other | Admitting: Family Medicine

## 2019-04-09 ENCOUNTER — Other Ambulatory Visit: Payer: Self-pay

## 2019-04-09 VITALS — BP 110/66 | HR 86

## 2019-04-09 DIAGNOSIS — K5900 Constipation, unspecified: Secondary | ICD-10-CM

## 2019-04-09 DIAGNOSIS — G8929 Other chronic pain: Secondary | ICD-10-CM

## 2019-04-09 DIAGNOSIS — M79641 Pain in right hand: Secondary | ICD-10-CM

## 2019-04-09 DIAGNOSIS — L720 Epidermal cyst: Secondary | ICD-10-CM | POA: Diagnosis not present

## 2019-04-09 HISTORY — DX: Constipation, unspecified: K59.00

## 2019-04-09 NOTE — Assessment & Plan Note (Signed)
Recommended that patient start MiraLAX and titrate up to achieve a soft bowel movement daily.  Patient has used MiraLAX before when preparing for colonoscopy, so she is familiar with this medication.

## 2019-04-09 NOTE — Progress Notes (Signed)
Subjective:    [name] - 63 y.o. female MRN [id number]  Date of birth: Jan 06, 1956  CC:  [name] is here for removal of sutures after excision of an epidermoid cyst on 7/16.  She would also like to discuss her constipation and chronic right hand pain.  HPI: Recent epidermoid cyst removal Patient had 2 epidermoid cysts removed on 7/16 in our dermatology clinic.  One was on the left side of her abdomen and the other was on her chest.  Both have healed well and are no longer painful, although they are a little itchy.  Patient denies any erythema or drainage from the 2 areas.  Two sutures were placed in the site on the left side of her abdomen, and the area on her chest did not need suturing.  Constipation Patient says that she has a bowel movement about every other day and these bowel movements are often very hard and the stool is small.  She would like to try something for constipation.  Chronic right hand pain Patient is unsure whether she should take naproxen or meloxicam for this, since we discussed at her last visit that she should only take 1 of these medications and to take that medication sparingly.  Her hand pain is stable.  Health Maintenance:  There are no preventive care reminders to display for this patient.  -  reports that she has been smoking cigarettes. She started smoking about 44 years ago. She has a 41.00 pack-year smoking history. She has never used smokeless tobacco. - Review of Systems: Per HPI. - Past Medical History: Patient Active Problem List   Diagnosis Date Noted  . Constipation 04/09/2019  . Chronic hand pain, right 03/26/2019  . Traumatic ecchymosis of toe of left foot 03/12/2019  . Epidermoid cyst 10/29/2018  . Right foot pain 04/02/2018  . Chronic bilateral low back pain without sciatica 02/08/2018  . Dyslipidemia 12/14/2017  . Vitamin D insufficiency 12/14/2017  . Fatigue 10/30/2017  . Tachycardia 12/28/2016  . Oral herpes 10/19/2015   . Arthritis of spine 03/27/2015  . Lumbar spine pain 01/09/2015  . Depression 07/19/2013  . Acute lumbar back pain 03/30/2012  . Anxiety state 02/28/2012  . Restless legs 02/28/2012   - Medications: reviewed and updated   Objective:   Physical Exam BP 110/66   Pulse 86   SpO2 95%  Gen: NAD, alert, cooperative with exam, well-appearing HEENT: NCAT, clear conjunctiva, supple neck CV: RRR, good S1/S2, no murmur, no edema Resp: CTABL, no wheezes, non-labored Abd: SNTND, BS present, no guarding or organomegaly Skin: Sites of epidermal cyst removals are clean, dry, and intact with good healing.  No signs of infection.  One suture is visualized on the left side of the abdomen.    Assessment & Plan:   Epidermoid cyst The visualized suture was removed on the left side of her abdomen.  Although suture 2 sutures were placed in the area, the second suture could not be visualized.  It is possible that is buried under the scab, so patient was advised to watch the area and to let us know if she sees a suture.  It has most likely fallen out on its own however.  Patient tolerated removal of the suture well.  Chronic hand pain, right Reviewed with patient her BMP results and specifically discussed her creatinine, which may be elevated due to her chronic NSAID use.  We will try using Voltaren gel instead of any oral NSAIDs, but if  this does not work, I advised patient to try either naproxen or meloxicam by itself then try the other medication by itself for a little while to see which one works best for her.  Discussed that she should not take this medication every day due to her worsening kidney function.  We will recheck her BMP in about 3 months and we may need to prescribe an alternative medication such as tramadol 50 mg if her creatinine remains elevated.  Constipation Recommended that patient start MiraLAX and titrate up to achieve a soft bowel movement daily.  Patient has used MiraLAX before when  preparing for colonoscopy, so she is familiar with this medication.    Maia Breslow, M.D. 04/09/2019, 1:17 PM PGY-3, [place]

## 2019-04-09 NOTE — Patient Instructions (Addendum)
It was nice seeing you today Ms. Manocchio!  For your constipation, buy a bottle of MiraLAX and use as much as you need to have a soft bowel movement about once per day.  For your hand pain, try Voltaren gel, which you can get over-the-counter at [facility] or a drugstore.  Please let me know if you have trouble finding this medication.  If this works for your hand pain, you do not need to take naproxen or meloxicam.  If it does not work, choose one medication and take it by itself to see which one is more effective for you.  Take as little of this medication as you can, since taking it every day can hurt your kidneys over time.  Please let us know if you see a suture in the area on your abdomen after your scab falls off.  We will remove it if it is still there.  We will test your kidney function in about 3 months.  If you have any questions or concerns, please feel free to call the clinic.   Be well,  Dr. Shan Levans

## 2019-04-09 NOTE — Assessment & Plan Note (Addendum)
The visualized suture was removed on the left side of her abdomen.  Although suture 2 sutures were placed in the area, the second suture could not be visualized.  It is possible that is buried under the scab, so patient was advised to watch the area and to let us know if she sees a suture.  It has most likely fallen out on its own however.  Patient tolerated removal of the suture well.

## 2019-04-09 NOTE — Assessment & Plan Note (Signed)
Reviewed with patient her BMP results and specifically discussed her creatinine, which may be elevated due to her chronic NSAID use.  We will try using Voltaren gel instead of any oral NSAIDs, but if this does not work, I advised patient to try either naproxen or meloxicam by itself then try the other medication by itself for a little while to see which one works best for her.  Discussed that she should not take this medication every day due to her worsening kidney function.  We will recheck her BMP in about 3 months and we may need to prescribe an alternative medication such as tramadol 50 mg if her creatinine remains elevated.

## 2019-04-18 ENCOUNTER — Other Ambulatory Visit: Payer: Self-pay

## 2019-04-18 ENCOUNTER — Ambulatory Visit
Admission: RE | Admit: 2019-04-18 | Discharge: 2019-04-18 | Disposition: A | Discharge status: Home / Self Care | Payer: Medicare Other | Source: Ambulatory Visit | Attending: Family Medicine | Admitting: Family Medicine

## 2019-04-18 DIAGNOSIS — Z1231 Encounter for screening mammogram for malignant neoplasm of breast: Secondary | ICD-10-CM | POA: Diagnosis not present

## 2019-05-03 ENCOUNTER — Telehealth (INDEPENDENT_AMBULATORY_CARE_PROVIDER_SITE_OTHER): Payer: Medicare Other | Admitting: Family Medicine

## 2019-05-03 ENCOUNTER — Other Ambulatory Visit: Payer: Self-pay

## 2019-05-03 DIAGNOSIS — Z20828 Contact with and (suspected) exposure to other viral communicable diseases: Secondary | ICD-10-CM | POA: Diagnosis not present

## 2019-05-03 DIAGNOSIS — Z20822 Contact with and (suspected) exposure to covid-19: Secondary | ICD-10-CM | POA: Insufficient documentation

## 2019-05-03 HISTORY — DX: Contact with and (suspected) exposure to covid-19: Z20.822

## 2019-05-03 NOTE — Assessment & Plan Note (Addendum)
Mother-in-law tested positive for COVID with patient with close contact.  Currently asymptomatic.  Will order COVID testing.  Instructions given to patient regarding testing as well as isolation guidelines.  She should continue to wear her mask and practice good hand hygiene and self isolate at home until results return.  Also recommended smoking cessation.  Patient verbalized understanding.

## 2019-05-03 NOTE — Progress Notes (Signed)
[facility] Telemedicine Visit  Patient consented to have virtual visit. Method of visit: Telephone  Encounter participants: Patient: [name] - located at home Provider: Rory Percy - located at [facility] Others (if applicable): None  Chief Complaint: Exposure to COVID  HPI:  Reports her mother-in-law went to a wedding on Saturday and patient then went to her mother-in-law's house on Monday to pick some of the leftover food.  She then found yesterday that her mother-in-law tested positive for COVID.  Her mother-in-law is doing fairly well but is having difficulties talking.  Patient denies any current symptoms including fever, difficulties breathing, cough, congestion, nausea, vomiting, diarrhea.  She is a current smoker.  ROS: per HPI  Pertinent PMHx: anxiety, depression, HLD, chronic back pain, tobacco use  Exam:  Respiratory: Speaking in full sentences, no respiratory distress  Assessment/Plan:  Close Exposure to Covid-19 Virus Mother-in-law tested positive for COVID with patient with close contact.  Currently asymptomatic.  Will order COVID testing.  Instructions given to patient regarding testing as well as isolation guidelines.  She should continue to wear her mask and practice good hand hygiene and self isolate at home until results return.  Also recommended smoking cessation.  Patient verbalized understanding.   Time spent during visit with patient: 6 minutes

## 2019-05-03 NOTE — Progress Notes (Signed)
176# BP-N/A T- NO FEVER  [id number] - [place], [place] - [address] AT [address]  PT GAVE CONSENT TO TELEPHONE VISIT. Salvatore Marvel, CMA

## 2019-05-06 ENCOUNTER — Other Ambulatory Visit: Payer: Self-pay

## 2019-05-06 DIAGNOSIS — R6889 Other general symptoms and signs: Secondary | ICD-10-CM | POA: Diagnosis not present

## 2019-05-06 DIAGNOSIS — Z20822 Contact with and (suspected) exposure to covid-19: Secondary | ICD-10-CM

## 2019-05-07 LAB — NOVEL CORONAVIRUS, NAA: SARS-CoV-2, NAA: NOT DETECTED

## 2019-05-09 ENCOUNTER — Telehealth: Payer: Self-pay | Admitting: *Deleted

## 2019-05-09 NOTE — Telephone Encounter (Signed)
Pt informed of negative covid results

## 2019-06-14 ENCOUNTER — Ambulatory Visit (INDEPENDENT_AMBULATORY_CARE_PROVIDER_SITE_OTHER): Payer: Medicare Other

## 2019-06-14 ENCOUNTER — Other Ambulatory Visit: Payer: Self-pay

## 2019-06-14 DIAGNOSIS — Z23 Encounter for immunization: Secondary | ICD-10-CM

## 2019-06-14 NOTE — Progress Notes (Signed)
Patient presents in nurse clinic Flu vaccine. Vaccine given, LD. Patient tolerated well.

## 2019-06-25 DIAGNOSIS — H25813 Combined forms of age-related cataract, bilateral: Secondary | ICD-10-CM | POA: Diagnosis not present

## 2019-06-25 DIAGNOSIS — H1013 Acute atopic conjunctivitis, bilateral: Secondary | ICD-10-CM | POA: Diagnosis not present

## 2019-08-01 DIAGNOSIS — H25813 Combined forms of age-related cataract, bilateral: Secondary | ICD-10-CM | POA: Diagnosis not present

## 2019-08-26 ENCOUNTER — Other Ambulatory Visit: Payer: Self-pay

## 2019-08-26 NOTE — Patient Outreach (Signed)
[facility] Care Management  08/26/2019  CAMBRE MATSON 10/12/1955 [id number]   Medication Adherence call to Mrs. Adriyanna Vanzandt [facility] Identifiers Verify spoke with patient she is past due on Atorvastatin 40 mg,patient explain she takes 1 tablet daily,patient ask to call [facility] to fill the prescription for her.[facility] will have it ready for patient to pick up. Mrs. Ivery is showing past due under [place].   [place] Management Direct Dial [phone]  Fax [phone] [email or web address]

## 2019-09-26 ENCOUNTER — Ambulatory Visit (INDEPENDENT_AMBULATORY_CARE_PROVIDER_SITE_OTHER): Payer: Medicare Other

## 2019-09-26 ENCOUNTER — Other Ambulatory Visit: Payer: Self-pay

## 2019-09-26 VITALS — Ht 68.0 in | Wt 163.0 lb

## 2019-09-26 DIAGNOSIS — Z0000 Encounter for general adult medical examination without abnormal findings: Secondary | ICD-10-CM

## 2019-09-26 NOTE — Progress Notes (Addendum)
Subjective:   [name] is a 64 y.o. female who presents for Medicare Annual (Subsequent) preventive examination.  The patient consented to a virtual visit.  Review of Systems: Defer to PCP  Objective:   Vitals: Ht 5\' 8"  (1.727 m)   Wt 163 lb (73.9 kg)   BMI 24.78 kg/m   Body mass index is 24.78 kg/m.  Advanced Directives 09/26/2019 03/28/2019 03/26/2019 08/07/2018 06/27/2018 03/27/2018 12/11/2017  Does Patient Have a Medical Advance Directive? No No No No No No No  Would patient like information on creating a medical advance directive? Yes (MAU/Ambulatory/Procedural Areas - Information given) No - Patient declined No - Patient declined No - Patient declined No - Patient declined No - Patient declined No - Patient declined   Tobacco Social History   Tobacco Use  Smoking Status Current Every Day Smoker  . Packs/day: 1.00  . Years: 41.00  . Pack years: 41.00  . Types: Cigarettes  . Start date: 04/13/1975  Smokeless Tobacco Never Used     Ready to quit: No Counseling given: Yes  Clinical Intake:  Pre-visit preparation completed: Yes  How often do you need to have someone help you when you read instructions, pamphlets, or other written materials from your doctor or pharmacy?: 2 - Rarely What is the last grade level you completed in school?: High School  Past Medical History:  Diagnosis Date  . Allergy    seasonal  . Anxiety 2009  . Arthritis 2000  . Depression   . Fibroid 2003  . Hot flashes, menopausal 2012  . RLS (restless legs syndrome)   . Shortness of breath    with exertion denies  . Wears glasses    Past Surgical History:  Procedure Laterality Date  . ARTHROTOMY Right 11/09/2015   Procedure: RIGHT WRIST DISTAL RADIAL ULNAR JOINT ARTHROTOMY AND DEBRIDEMENT,  AND ;  Surgeon: Iran Planas, MD;  Location: [place];  Service: Orthopedics;  Laterality: Right;  . COLONOSCOPY    . HARDWARE REMOVAL Right 07/24/2013   Procedure: RIGHT WRIST HARDWARE REMOVAL, JOINT  RELEASE, RIGHT HAND MANIPULATION UNDER ANESTHESIA;  Surgeon: Linna Hoff, MD;  Location: [place];  Service: Orthopedics;  Laterality: Right;  . NECK SURGERY  2003   Cervical vertebroplasty   . OPEN REDUCTION INTERNAL FIXATION (ORIF) DISTAL RADIAL FRACTURE Right 11/08/2012   Procedure: OPEN REDUCTION INTERNAL FIXATION (ORIF) RIGHT DISTAL RADIUS FRACTURE;  Surgeon: Linna Hoff, MD;  Location: [place];  Service: Orthopedics;  Laterality: Right;  . WRIST ARTHROPLASTY Right 11/09/2015   Procedure: OR DISTAL ULNAR RESECTION ARTHROPLASTY ;  Surgeon: Iran Planas, MD;  Location: [place];  Service: Orthopedics;  Laterality: Right;  . WRIST ARTHROSCOPY WITH DEBRIDEMENT Right 07/21/2014   Procedure: RIGHT WRIST DISTAL RADIAL ULNAR JOINT DEBRIDEMENT AND JOINT RELEASE POSSIBLE TENDON INTERPOSITION;  Surgeon: Linna Hoff, MD;  Location: [place];  Service: Orthopedics;  Laterality: Right;   Family History  Problem Relation Age of Onset  . Hypertension Mother   . Hyperlipidemia Mother   . Early death Sister 19       Drug overdose  . Dementia Father   . Cancer Father        Throat cancer   . Parkinson's disease Father   . Hypertension Daughter   . Diabetes Daughter   . Cancer Brother        Throat   Social History   Socioeconomic History  . Marital status: Married  Spouse name: Jelitza Manninen  . Number of children: 2  . Years of education: 71  . Highest education level: Not on file  Occupational History  . Occupation: Unemployed     Employer:  [employer]    Comment: Sweep   Tobacco Use  . Smoking status: Current Every Day Smoker    Packs/day: 1.00    Years: 41.00    Pack years: 41.00    Types: Cigarettes    Start date: 04/13/1975  . Smokeless tobacco: Never Used  Substance and Sexual Activity  . Alcohol use: No    Comment: Holidays only  . Drug use: No  . Sexual activity: Not Currently    Partners: Male    Birth control/protection: None,  Post-menopausal  Other Topics Concern  . Not on file  Social History Narrative   Patient lives alone currently in [place].    Patient is married, they are living separately at this time.    Patient enjoys watching tv, cooking, and spending time with her children and grandchildren.    Social Determinants of Health   Financial Resource Strain: Low Risk   . Difficulty of Paying Living Expenses: Not hard at all  Food Insecurity: No Food Insecurity  . Worried About Charity fundraiser in the Last Year: Never true  . Ran Out of Food in the Last Year: Never true  Transportation Needs: No Transportation Needs  . Lack of Transportation (Medical): No  . Lack of Transportation (Non-Medical): No  Physical Activity: Inactive  . Days of Exercise per Week: 0 days  . Minutes of Exercise per Session: 0 min  Stress: No Stress Concern Present  . Feeling of Stress : Not at all  Social Connections: Somewhat Isolated  . Frequency of Communication with Friends and Family: More than three times a week  . Frequency of Social Gatherings with Friends and Family: More than three times a week  . Attends Religious Services: Never  . Active Member of Clubs or Organizations: No  . Attends Archivist Meetings: Never  . Marital Status: Married   Outpatient Encounter Medications as of 09/26/2019  Medication Sig  . acetaminophen (TYLENOL) 650 MG CR tablet Take 650 mg by mouth every 8 (eight) hours as needed for pain.  Marland Kitchen atorvastatin (LIPITOR) 40 MG tablet Take 1 tablet (40 mg total) by mouth daily.  . carbamide peroxide (DEBROX) 6.5 % otic solution Place 5 drops into both ears 2 (two) times daily.  . Cholecalciferol (VITAMIN D) 2000 units CAPS Take 1 capsule (2,000 Units total) by mouth daily.  . citalopram (CELEXA) 10 MG tablet Take 1 tablet (10 mg total) by mouth daily.  . clobetasol cream (TEMOVATE) 0.53 % Apply 1 application topically 2 (two) times daily as needed.  . cyclobenzaprine (FLEXERIL) 10  MG tablet Take 1 tablet (10 mg total) by mouth 2 (two) times daily as needed for muscle spasms.  Marland Kitchen docusate sodium (COLACE) 100 MG capsule Take 1 capsule (100 mg total) by mouth 2 (two) times daily.  Marland Kitchen gabapentin (NEURONTIN) 600 MG tablet Take 1 tablet (600 mg total) by mouth 3 (three) times daily.  . hydrOXYzine (ATARAX/VISTARIL) 50 MG tablet Take 1 tablet (50 mg total) by mouth at bedtime as needed.  Marland Kitchen rOPINIRole (REQUIP) 0.25 MG tablet TAKE 1 TABLET(0.25 MG) BY MOUTH AT BEDTIME  . valACYclovir (VALTREX) 1000 MG tablet Take 2 tablets (2,000 mg total) by mouth 2 (two) times daily.  . vitamin C (ASCORBIC ACID) 500 MG tablet  Take 1 tablet (500 mg total) by mouth daily.  Marland Kitchen loratadine (CLARITIN) 10 MG tablet Take 1 tablet (10 mg total) by mouth daily. (Patient not taking: Reported on 09/26/2019)  . meloxicam (MOBIC) 15 MG tablet Take 1 tablet (15 mg total) by mouth daily as needed for pain. (Patient not taking: Reported on 09/26/2019)  . mometasone (NASONEX) 50 MCG/ACT nasal spray Place 2 sprays into the nose daily. (Patient not taking: Reported on 09/26/2019)  . traZODone (DESYREL) 50 MG tablet TAKE 1/2 TO 1 TABLET BY MOUTH EVERY DAY AT BEDTIME AS NEEDED FOR SLEEP (Patient not taking: Reported on 09/26/2019)   No facility-administered encounter medications on file as of 09/26/2019.   Activities of Daily Living In your present state of health, do you have any difficulty performing the following activities: 09/26/2019  Hearing? N  Vision? N  Difficulty concentrating or making decisions? N  Walking or climbing stairs? Y  Comment tires easily  Dressing or bathing? N  Doing errands, shopping? N  Preparing Food and eating ? N  Using the Toilet? N  In the past six months, have you accidently leaked urine? N  Do you have problems with loss of bowel control? N  Managing your Medications? N  Managing your Finances? N  Housekeeping or managing your Housekeeping? N  Some recent data might be hidden    Patient Care Team: Kathrene Alu, MD as PCP - General (Family Medicine) Sable Feil, MD as Consulting Physician (Gastroenterology) Alanda Slim Neena Rhymes, MD as Consulting Physician (Ophthalmology)    Assessment:   This is a routine wellness examination for Vernadine.  Exercise Activities and Dietary recommendations Current Exercise Habits: The patient does not participate in regular exercise at present  Goals    . 3x a week for 30 mins (pt-stated)     Walking around your home or outside when its nice.     . Quit Smoking     Materials and counseling given. Patient is a PPD smoker as of 09/26/2019.      Fall Risk Fall Risk  09/26/2019 04/09/2019 03/28/2019 03/12/2019 08/07/2018  Falls in the past year? 0 0 0 0 0  Number falls in past yr: - 0 - 0 -  Injury with Fall? - - - - -  Risk for fall due to : - - - - -  Follow up - Falls evaluation completed - Falls evaluation completed -   Is the patient's home free of loose throw rugs in walkways, pet beds, electrical cords, etc?   yes      Grab bars in the bathroom? yes      Handrails on the stairs?   yes      Adequate lighting?   yes  Patient rating of health (0-10) scale: 10   Depression Screen PHQ 2/9 Scores 09/26/2019 04/09/2019 03/28/2019 03/26/2019  PHQ - 2 Score 0 0 0 0    Cognitive Function 6CIT Screen 09/26/2019  What Year? 0 points  What month? 0 points  What time? 0 points  Count back from 20 2 points  Months in reverse 2 points  Repeat phrase 0 points  Total Score 4   Immunization History  Administered Date(s) Administered  . Influenza,inj,Quad PF,6+ Mos 07/19/2013, 07/17/2014, 06/08/2015, 10/07/2016, 09/13/2017, 06/27/2018, 06/14/2019  . PPD Test 10/12/2012  . Pneumococcal Polysaccharide-23 07/17/2014  . Tdap 03/30/2012   Screening Tests Health Maintenance  Topic Date Due  . PAP SMEAR-Modifier  01/04/2020  . MAMMOGRAM  04/17/2021  . TETANUS/TDAP  03/30/2022  . COLONOSCOPY  04/25/2022  . INFLUENZA  VACCINE  Completed  . Hepatitis C Screening  Completed  . HIV Screening  Completed   Cancer Screenings: Lung: Low Dose CT Chest recommended if Age 53-80 years, 30 pack-year currently smoking OR have quit w/in 15years. Patient does qualify. Breast:  Up to date on Mammogram? Yes   Up to date of Bone Density/Dexa? N/A Colorectal: UTD  Additional Screenings: Hepatitis C Screening: Completed  HIV Screening: Completed   Plan:  Start walking around your home or outside 3x a week 30 mins per time. February 26th @1 :30p apt with PCP. Fill out an advance directive at your February apt.  I have personally reviewed and noted the following in the patient's chart:   . Medical and social history . Use of alcohol, tobacco or illicit drugs  . Current medications and supplements . Functional ability and status . Nutritional status . Physical activity . Advanced directives . List of other physicians . Hospitalizations, surgeries, and ER visits in previous 12 months . Vitals . Screenings to include cognitive, depression, and falls . Referrals and appointments  In addition, I have reviewed and discussed with patient certain preventive protocols, quality metrics, and best practice recommendations. A written personalized care plan for preventive services as well as general preventive health recommendations were provided to patient.  This visit was conducted virtually in the setting of the [place] pandemic.    Dorna Bloom, [place]  09/26/2019    I have reviewed this visit and agree with the documentation.   Amanda C. Shan Levans, MD PGY-3, [facility] 09/26/2019 9:10 PM

## 2019-09-26 NOTE — Patient Instructions (Signed)
You spoke to [name], [name] over the phone for your annual wellness visit.  We discussed goals: Goals    . 3x a week for 30 mins (pt-stated)     Walking around your home or outside when its nice.     . Quit Smoking     Materials and counseling given. Patient is a PPD smoker as of 09/26/2019.      We also discussed recommended health maintenance. As discussed, you are up to date with everything, nice job! We have scheduled you an apt with your PCP for February to check in before she graduates.   Health Maintenance  Topic Date Due  . PAP SMEAR-Modifier  01/04/2020  . MAMMOGRAM  04/17/2021  . TETANUS/TDAP  03/30/2022  . COLONOSCOPY  04/25/2022  . INFLUENZA VACCINE  Completed  . Hepatitis C Screening  Completed  . HIV Screening  Completed   Start walking around your home or outside 3x a week 30 mins per time. February 26th @1 :30p apt with PCP. Fill out an advance directive at your February apt.  We also discussed smoking cessation. Use 1-800 Quit line!  Call 1800-QUIT-NOW for help with stopping smoking.  Health Maintenance, Female Adopting a healthy lifestyle and getting preventive care are important in promoting health and wellness. Ask your health care provider about:  The right schedule for you to have regular tests and exams.  Things you can do on your own to prevent diseases and keep yourself healthy. What should I know about diet, weight, and exercise? Eat a healthy diet   Eat a diet that includes plenty of vegetables, fruits, low-fat dairy products, and lean protein.  Do not eat a lot of foods that are high in solid fats, added sugars, or sodium. Maintain a healthy weight Body mass index (BMI) is used to identify weight problems. It estimates body fat based on height and weight. Your health care provider can help determine your BMI and help you achieve or maintain a healthy weight. Get regular exercise Get regular exercise. This is one of the most important  things you can do for your health. Most adults should:  Exercise for at least 150 minutes each week. The exercise should increase your heart rate and make you sweat (moderate-intensity exercise).  Do strengthening exercises at least twice a week. This is in addition to the moderate-intensity exercise.  Spend less time sitting. Even light physical activity can be beneficial. Watch cholesterol and blood lipids Have your blood tested for lipids and cholesterol at 65 years of age, then have this test every 5 years. Have your cholesterol levels checked more often if:  Your lipid or cholesterol levels are high.  You are older than 65 years of age.  You are at high risk for heart disease. What should I know about cancer screening? Depending on your health history and family history, you may need to have cancer screening at various ages. This may include screening for:  Breast cancer.  Cervical cancer.  Colorectal cancer.  Skin cancer.  Lung cancer. What should I know about heart disease, diabetes, and high blood pressure? Blood pressure and heart disease  High blood pressure causes heart disease and increases the risk of stroke. This is more likely to develop in people who have high blood pressure readings, are of African descent, or are overweight.  Have your blood pressure checked: ? Every 3-5 years if you are 68-14 years of age. ? Every year if you are 65 years old  or older. Diabetes Have regular diabetes screenings. This checks your fasting blood sugar level. Have the screening done:  Once every three years after age 24 if you are at a normal weight and have a low risk for diabetes.  More often and at a younger age if you are overweight or have a high risk for diabetes. What should I know about preventing infection? Hepatitis B If you have a higher risk for hepatitis B, you should be screened for this virus. Talk with your health care provider to find out if you are at risk  for hepatitis B infection. Hepatitis C Testing is recommended for:  Everyone born from [birth year] through 1966.  Anyone with known risk factors for hepatitis C. Sexually transmitted infections (STIs)  Get screened for STIs, including gonorrhea and chlamydia, if: ? You are sexually active and are younger than 65 years of age. ? You are older than 65 years of age and your health care provider tells you that you are at risk for this type of infection. ? Your sexual activity has changed since you were last screened, and you are at increased risk for chlamydia or gonorrhea. Ask your health care provider if you are at risk.  Ask your health care provider about whether you are at high risk for HIV. Your health care provider may recommend a prescription medicine to help prevent HIV infection. If you choose to take medicine to prevent HIV, you should first get tested for HIV. You should then be tested every 3 months for as long as you are taking the medicine. Pregnancy  If you are about to stop having your period (premenopausal) and you may become pregnant, seek counseling before you get pregnant.  Take 400 to 800 micrograms (mcg) of folic acid every day if you become pregnant.  Ask for birth control (contraception) if you want to prevent pregnancy. Osteoporosis and menopause Osteoporosis is a disease in which the bones lose minerals and strength with aging. This can result in bone fractures. If you are 45 years old or older, or if you are at risk for osteoporosis and fractures, ask your health care provider if you should:  Be screened for bone loss.  Take a calcium or vitamin D supplement to lower your risk of fractures.  Be given hormone replacement therapy (HRT) to treat symptoms of menopause. Follow these instructions at home: Lifestyle  Do not use any products that contain nicotine or tobacco, such as cigarettes, e-cigarettes, and chewing tobacco. If you need help quitting, ask your health  care provider.  Do not use street drugs.  Do not share needles.  Ask your health care provider for help if you need support or information about quitting drugs. Alcohol use  Do not drink alcohol if: ? Your health care provider tells you not to drink. ? You are pregnant, may be pregnant, or are planning to become pregnant.  If you drink alcohol: ? Limit how much you use to 0-1 drink a day. ? Limit intake if you are breastfeeding.  Be aware of how much alcohol is in your drink. In the [place] one drink equals one 12 oz bottle of beer (355 mL), one 5 oz glass of wine (148 mL), or one 1 oz glass of hard liquor (44 mL). General instructions  Schedule regular health, dental, and eye exams.  Stay current with your vaccines.  Tell your health care provider if: ? You often feel depressed. ? You have ever been abused or do not  feel safe at home. Summary  Adopting a healthy lifestyle and getting preventive care are important in promoting health and wellness.  Follow your health care provider's instructions about healthy diet, exercising, and getting tested or screened for diseases.  Follow your health care provider's instructions on monitoring your cholesterol and blood pressure. This information is not intended to replace advice given to you by your health care provider. Make sure you discuss any questions you have with your health care provider. Document Revised: 08/22/2018 Document Reviewed: 08/22/2018 Elsevier Patient Education  2020 [facility]'s number is [phone]. Please call with questions or concerns about what we discussed today.

## 2019-10-01 ENCOUNTER — Other Ambulatory Visit: Payer: Self-pay

## 2019-10-01 DIAGNOSIS — F411 Generalized anxiety disorder: Secondary | ICD-10-CM

## 2019-10-01 DIAGNOSIS — E785 Hyperlipidemia, unspecified: Secondary | ICD-10-CM

## 2019-10-02 MED ORDER — ROPINIROLE HCL 0.25 MG PO TABS: ORAL_TABLET | ORAL | 2 refills | Status: DC

## 2019-10-02 MED ORDER — ATORVASTATIN CALCIUM 40 MG PO TABS: 40.0000 mg | ORAL_TABLET | Freq: Every day | ORAL | 3 refills | Status: DC

## 2019-10-02 MED ORDER — HYDROXYZINE HCL 50 MG PO TABS: 50.0000 mg | ORAL_TABLET | Freq: Every evening | ORAL | 2 refills | Status: DC | PRN

## 2019-10-02 MED ORDER — GABAPENTIN 600 MG PO TABS: 600.0000 mg | ORAL_TABLET | Freq: Three times a day (TID) | ORAL | 3 refills | Status: DC

## 2019-10-17 DIAGNOSIS — R519 Headache, unspecified: Secondary | ICD-10-CM | POA: Diagnosis not present

## 2019-10-17 DIAGNOSIS — H25813 Combined forms of age-related cataract, bilateral: Secondary | ICD-10-CM | POA: Diagnosis not present

## 2019-10-22 ENCOUNTER — Ambulatory Visit (INDEPENDENT_AMBULATORY_CARE_PROVIDER_SITE_OTHER): Payer: Medicare Other | Admitting: Family Medicine

## 2019-10-22 ENCOUNTER — Other Ambulatory Visit: Payer: Self-pay

## 2019-10-22 ENCOUNTER — Telehealth: Payer: Self-pay | Admitting: Family Medicine

## 2019-10-22 ENCOUNTER — Encounter: Payer: Self-pay | Admitting: Family Medicine

## 2019-10-22 ENCOUNTER — Ambulatory Visit
Admission: RE | Admit: 2019-10-22 | Discharge: 2019-10-22 | Disposition: A | Discharge status: Home / Self Care | Payer: Medicare Other | Source: Ambulatory Visit | Attending: Family Medicine | Admitting: Family Medicine

## 2019-10-22 VITALS — BP 120/62 | HR 88

## 2019-10-22 DIAGNOSIS — S99921A Unspecified injury of right foot, initial encounter: Secondary | ICD-10-CM

## 2019-10-22 DIAGNOSIS — M79671 Pain in right foot: Secondary | ICD-10-CM | POA: Diagnosis not present

## 2019-10-22 DIAGNOSIS — R7989 Other specified abnormal findings of blood chemistry: Secondary | ICD-10-CM | POA: Diagnosis not present

## 2019-10-22 NOTE — Progress Notes (Signed)
   CHIEF COMPLAINT / HPI:  Patient reports that she stubbed her toe on a chair on Saturday, February 6 which was 4 days ago.  Patient notes that she has continued to have pain in the area and swelling.  She denies any numbness or tingling.  She reports that she has been using ice to the affected area and elevating her foot.  She is having difficulty with walking.  She says she can walk on the side of her foot with less pain.  PERTINENT  PMH / PSH: None  OBJECTIVE: BP 120/62   Pulse 88   SpO2 96%   Well-appearing female in clinic provided wheelchair.  No acute distress.  Mild erythema and swelling to the dorsal aspect of her right distal metatarsals.  Patient's range of movement limited by pain but she is able to flex and extend the third toe.  She has pain to palpation of the third toe.  No pain to palpation of the third metatarsal or surrounding metatarsals or digits.  ASSESSMENT / PLAN:  Toe injury, right, initial encounter Given patient's persistent pain, swelling and tenderness on exam today, will obtain x-ray to rule out any fracture.  Continue rice protocol.  Will call patients with results of x-rays.  Patient encouraged to take Tylenol and avoid ibuprofen and other NSAIDs as her creatinine was elevated and GFR decreased at her last visit.  We will recheck these values today.  Elevated serum creatinine Patient denies any history of chronic kidney disease.  She does not have any hypertension or diabetes listed on her problem list.  Recent results on 03/26/2019 show creatinine of 1.36 and GFR 47.  This would be abnormal for her age.  She does report that she thinks she was using some NSAIDs at the time.  Avoiding NSAIDs for toe injury at this time.  Follow-up with results.     Wilber Oliphant, MD [place]

## 2019-10-22 NOTE — Patient Instructions (Signed)
Dear [name],   It was good to see you! Thank you for taking your time to come in to be seen. Today, we discussed the following:   Toe Pain (Rt 3rd toe)   Go to [facility] to get imaging to rule out fracture.  Ice, tylenol. Do not use NSAIDs (ibuprofen, advil, aleve, etc)   Be well,   Zettie Cooley, M.D   [facility] [phone]  *Sign up for MyChart for instant access to your health profile, labs, orders, upcoming appointments or to contact your provider with questions*  ===================================================================================

## 2019-10-22 NOTE — Assessment & Plan Note (Signed)
Given patient's persistent pain, swelling and tenderness on exam today, will obtain x-ray to rule out any fracture.  Continue rice protocol.  Will call patients with results of x-rays.  Patient encouraged to take Tylenol and avoid ibuprofen and other NSAIDs as her creatinine was elevated and GFR decreased at her last visit.  We will recheck these values today.

## 2019-10-22 NOTE — Assessment & Plan Note (Signed)
Patient denies any history of chronic kidney disease.  She does not have any hypertension or diabetes listed on her problem list.  Recent results on 03/26/2019 show creatinine of 1.36 and GFR 47.  This would be abnormal for her age.  She does report that she thinks she was using some NSAIDs at the time.  Avoiding NSAIDs for toe injury at this time.  Follow-up with results.

## 2019-10-22 NOTE — Telephone Encounter (Signed)
Called patient and informed about no signs of fracture on x-ray.  Encourage patient to buddy tape (which she did with the last injury she had similar to this on her right foot).  Also can continue to ice for swelling and use Tylenol for pain.  Patient asked about lab results, which have not returned yet.  We will follow-up with her at that time.

## 2019-10-23 LAB — BASIC METABOLIC PANEL
BUN/Creatinine Ratio: 21 (ref 12–28)
BUN: 19 mg/dL (ref 8–27)
CO2: 26 mmol/L (ref 20–29)
Calcium: 9.7 mg/dL (ref 8.7–10.3)
Chloride: 104 mmol/L (ref 96–106)
Creatinine, Ser: 0.89 mg/dL (ref 0.57–1.00)
GFR calc Af Amer: 79 mL/min/{1.73_m2} (ref >59)
GFR calc non Af Amer: 69 mL/min/{1.73_m2} (ref >59)
Glucose: 76 mg/dL (ref 65–99)
Potassium: 4.7 mmol/L (ref 3.5–5.2)
Sodium: 143 mmol/L (ref 134–144)

## 2019-10-30 ENCOUNTER — Telehealth: Payer: Self-pay

## 2019-10-30 NOTE — Telephone Encounter (Signed)
Patient calls nurse line and reports that she has been experiencing severe abdominal cramping since dosage increase of gabapentin. Patient denies nausea and vomiting. No other symptoms at this time.  Talbot Grumbling, RN

## 2019-10-31 NOTE — Telephone Encounter (Signed)
I would advise that patient discontinue the gabapentin for now.  Her abdominal cramping may be due to another cause, so if it continues, she should be seen early next week in our clinic for evaluation.

## 2019-11-01 NOTE — Telephone Encounter (Signed)
Called patient to inform of below. Per patient she was not actually having abdominal cramping, but cramping in her legs. She describes the cramping as intermittent.   Please advise  To PCP  Talbot Grumbling, RN

## 2019-11-04 NOTE — Telephone Encounter (Signed)
Called patient to follow-up on her leg cramping.  She says that she had a small one on Saturday but none others since Friday.  She continues to take a reduced amount of gabapentin.  I recommended staying well-hydrated, massaging the affected muscles, and using a warm compress on the affected areas as well.  We will follow this up during her appointment with me on 2/26 and may obtain labs at that time as well.  Patient expressed understanding and agreement with this plan.

## 2019-11-08 ENCOUNTER — Other Ambulatory Visit: Payer: Self-pay

## 2019-11-08 ENCOUNTER — Ambulatory Visit (INDEPENDENT_AMBULATORY_CARE_PROVIDER_SITE_OTHER): Payer: Medicare Other | Admitting: Family Medicine

## 2019-11-08 VITALS — BP 120/66 | HR 93 | Wt 181.0 lb

## 2019-11-08 DIAGNOSIS — M79671 Pain in right foot: Secondary | ICD-10-CM | POA: Diagnosis not present

## 2019-11-08 DIAGNOSIS — M47819 Spondylosis without myelopathy or radiculopathy, site unspecified: Secondary | ICD-10-CM | POA: Diagnosis not present

## 2019-11-08 DIAGNOSIS — R7989 Other specified abnormal findings of blood chemistry: Secondary | ICD-10-CM

## 2019-11-08 NOTE — Patient Instructions (Signed)
It was nice meeting you today Ms. Rolin!  We are ordering a neck x-ray to make sure that everything is in good after your surgery.  I am pleased with how your foot looks and think it will continue to feel better over the next few weeks.  If it does not continue to improve, please let me know.  If you have any questions or concerns, please feel free to call the clinic.   Be well,  Dr. Shan Levans

## 2019-11-08 NOTE — Assessment & Plan Note (Signed)
Has had spinal fixation years ago, and her cervical spine last x-ray was in 2016.  No red flag symptoms today.  Counseled patient that getting a cervical spine x-ray would likely not change management, but patient still would like an x-ray to "check on how it looks."  We will order cervical spine x-ray per patient preference today.

## 2019-11-08 NOTE — Assessment & Plan Note (Signed)
Sequela of toe injury, improving.  Foot x-ray was normal.  Reassured patient that she can continue to take Tylenol to use the open boot that she is currently using.  Asked patient to let me know if she does not continue to experience improvement over the next few weeks.

## 2019-11-08 NOTE — Progress Notes (Addendum)
    SUBJECTIVE:   CHIEF COMPLAINT / HPI:   Neck pain Had cervical spine surgery 10-15 years ago Will sometimes here a rubbing noise when she moves her neck Will also feel pain in her neck depending on how she sleeps Uses tylenol arthritis to help improve her pain No numbness and tingling in her arms  R Toe pain Stubbed her third toe on her right foot a few weeks ago Had swelling and bruising in the area of that toe shortly thereafter and did not want to bear weight, so an x-ray was obtained, which showed no bony abnormality He says that swelling and bruising have improved, but wearing a regular shoe on that foot is still uncomfortable  PERTINENT  PMH / PSH: Arthritis of the spine, depression, anxiety, toe injury  OBJECTIVE:   BP 120/66   Pulse 93   Wt 181 lb (82.1 kg)   SpO2 98%   BMI 27.52 kg/m   General: well appearing, appears stated age Cardiac: RRR, no MRG Respiratory: CTAB, no rhonchi, rales, or wheezing, normal work of breathing Cervical spine: No visual abnormality, nontender to palpation over the cervical spine and the paraspinal muscles, limited ROM but no pain on active movement, neurovascularly intact, Spurling's test is negative bilaterally Right foot: No visual abnormality, normal ROM of the right toes, tender to palpation of the right third metatarsal, neurovascularly intact  ASSESSMENT/PLAN:   Right foot pain Sequela of toe injury, improving.  Foot x-ray was normal.  Reassured patient that she can continue to take Tylenol to use the open boot that she is currently using.  Asked patient to let me know if she does not continue to experience improvement over the next few weeks.  Arthritis of spine Has had spinal fixation years ago, and her cervical spine last x-ray was in 2016.  No red flag symptoms today.  Counseled patient that getting a cervical spine x-ray would likely not change management, but patient still would like an x-ray to "check on how it looks."  We  will order cervical spine x-ray per patient preference today.     Kathrene Alu, MD [place]

## 2019-11-13 ENCOUNTER — Ambulatory Visit
Admission: RE | Admit: 2019-11-13 | Discharge: 2019-11-13 | Disposition: A | Discharge status: Home / Self Care | Payer: Medicare Other | Source: Ambulatory Visit | Attending: Family Medicine | Admitting: Family Medicine

## 2019-11-13 ENCOUNTER — Other Ambulatory Visit: Payer: Self-pay

## 2019-11-13 ENCOUNTER — Ambulatory Visit (INDEPENDENT_AMBULATORY_CARE_PROVIDER_SITE_OTHER): Payer: Medicare Other | Admitting: Family Medicine

## 2019-11-13 ENCOUNTER — Encounter: Payer: Self-pay | Admitting: Family Medicine

## 2019-11-13 VITALS — BP 130/60 | HR 85 | Wt 181.4 lb

## 2019-11-13 DIAGNOSIS — N951 Menopausal and female climacteric states: Secondary | ICD-10-CM | POA: Diagnosis not present

## 2019-11-13 DIAGNOSIS — M47819 Spondylosis without myelopathy or radiculopathy, site unspecified: Secondary | ICD-10-CM

## 2019-11-13 DIAGNOSIS — M542 Cervicalgia: Secondary | ICD-10-CM | POA: Diagnosis not present

## 2019-11-13 DIAGNOSIS — R252 Cramp and spasm: Secondary | ICD-10-CM | POA: Insufficient documentation

## 2019-11-13 HISTORY — DX: Cramp and spasm: R25.2

## 2019-11-13 HISTORY — DX: Menopausal and female climacteric states: N95.1

## 2019-11-13 MED ORDER — GABAPENTIN 300 MG PO CAPS: 900.0000 mg | ORAL_CAPSULE | Freq: Three times a day (TID) | ORAL | 3 refills | Status: DC

## 2019-11-13 MED ORDER — ESTROGENS, CONJUGATED 0.625 MG/GM VA CREA: 1.0000 | TOPICAL_CREAM | Freq: Every day | VAGINAL | 12 refills | Status: DC

## 2019-11-13 MED ORDER — VITAMIN B-COMPLEX PO TABS: 1.0000 | ORAL_TABLET | Freq: Three times a day (TID) | ORAL | 11 refills | Status: DC

## 2019-11-13 NOTE — Assessment & Plan Note (Signed)
Discussed with patient that unfortunately evidence is lacking on what therapy is best for treatment of leg cramping.  There is some evidence that gabapentin can help as well as vitamin B, so we will increase her gabapentin dose to 900 mg 3 times daily as needed and add a B complex vitamin 3 times daily.  Due to location of cramp in right calf, possibility of DVT exists, but this likelihood is very low given chronic, remitting nature of symptoms as well as normal exam today and no other red flag symptoms of PE or blood clots.

## 2019-11-13 NOTE — Progress Notes (Signed)
    SUBJECTIVE:   CHIEF COMPLAINT / HPI:   R calf cramping Cramps have occurred for the last year and a half No swelling in the calf No history of blood clots No SOB, chest pain, feeling well otherwise Has tried mustard, vinegar, which has had varying levels of effectiveness No known triggers, occur seemingly randomly, occur day and night Seem to be occurring more frequently and last longer  Vaginal dryness Patient has experienced vaginal dryness with dyspareunia and sometimes injury to the vulvar mucosa since menopause Is trying lubricants now but is curious to see if there is anything else that could help Denies vaginal irritation or itching Was on Celexa for treatment of menopausal symptoms but discontinued this medication a few months ago due to weight gain  PERTINENT  PMH / PSH: Anxiety, depression, low back pain, dyslipidemia, restless leg syndrome  OBJECTIVE:   BP 130/60   Pulse 85   Wt 181 lb 6.4 oz (82.3 kg)   SpO2 97%   BMI 27.58 kg/m   General: well appearing, appears stated age Cardiac: RRR, no MRG Respiratory: CTAB, no rhonchi, rales, or wheezing, normal work of breathing Lower extremities: No visual abnormality, no tenderness to palpation, normal ROM on knee flexion and extension, 5/5 strength on knee flexion extension, ankle flexion and extension Skin: no rashes or other lesions, warm and well perfused Psych: appropriate mood and affect    ASSESSMENT/PLAN:   Leg cramping Discussed with patient that unfortunately evidence is lacking on what therapy is best for treatment of leg cramping.  There is some evidence that gabapentin can help as well as vitamin B, so we will increase her gabapentin dose to 900 mg 3 times daily as needed and add a B complex vitamin 3 times daily.  Due to location of cramp in right calf, possibility of DVT exists, but this likelihood is very low given chronic, remitting nature of symptoms as well as normal exam today and no other red  flag symptoms of PE or blood clots.  Menopausal vaginal dryness Counseled patient that vaginal dryness and dyspareunia are common side effects of menopause but that they often respond to treatment.  I recommended that she continue to use lubrication and try Replens vaginal moisturizer as well.  Discussed that Premarin cream is often very effective for vaginal atrophy although it can be expensive.  She would like to try this as well.  This was sent to her pharmacy.     Kathrene Alu, MD [place]

## 2019-11-13 NOTE — Assessment & Plan Note (Signed)
Counseled patient that vaginal dryness and dyspareunia are common side effects of menopause but that they often respond to treatment.  I recommended that she continue to use lubrication and try Replens vaginal moisturizer as well.  Discussed that Premarin cream is often very effective for vaginal atrophy although it can be expensive.  She would like to try this as well.  This was sent to her pharmacy.

## 2019-11-18 NOTE — Progress Notes (Signed)
Left VM with results.  Signs of foraminal stenosis, but no signs of malalignment or other problems as a result of her prior surgery.

## 2019-11-28 ENCOUNTER — Ambulatory Visit: Payer: Medicare Other | Admitting: Family Medicine

## 2019-11-29 ENCOUNTER — Encounter: Payer: Self-pay | Admitting: Family Medicine

## 2019-11-29 ENCOUNTER — Other Ambulatory Visit: Payer: Self-pay

## 2019-11-29 ENCOUNTER — Ambulatory Visit (INDEPENDENT_AMBULATORY_CARE_PROVIDER_SITE_OTHER): Payer: Medicare Other | Admitting: Family Medicine

## 2019-11-29 VITALS — BP 124/64 | HR 79 | Wt 181.6 lb

## 2019-11-29 DIAGNOSIS — N951 Menopausal and female climacteric states: Secondary | ICD-10-CM

## 2019-11-29 DIAGNOSIS — Z529 Donor of unspecified organ or tissue: Secondary | ICD-10-CM | POA: Diagnosis not present

## 2019-11-29 HISTORY — DX: Donor of unspecified organ or tissue: Z52.9

## 2019-11-29 MED ORDER — VENLAFAXINE HCL ER 37.5 MG PO CP24: 37.5000 mg | ORAL_CAPSULE | Freq: Every day | ORAL | 11 refills | Status: DC

## 2019-11-29 NOTE — Assessment & Plan Note (Signed)
I am unsure how to proceed with starting the process of kidney donation.  I found a number that patient can call 3 [facility] organ donation center that may be able to guide her through the next steps.  I told her that I am happy to help her, but I will need further information from her about which labs to order.

## 2019-11-29 NOTE — Assessment & Plan Note (Signed)
Will start Effexor today since there is sufficient evidence that this can help reduce menopausal hot flashes.  Counseled patient that the effect of this medication builds over time and encouraged her to take it even if she has not felt a change in the first few weeks.

## 2019-11-29 NOTE — Progress Notes (Signed)
    SUBJECTIVE:   CHIEF COMPLAINT / HPI:   Hot flashes Have occurred for the last 7-8 years Occur day or night, no known trigger Have disrupted her sleep Has used fans, drinks cold water at nighttime to try to alleviate hot flashes Does not think that they have improved over the last 7 to 8 years Has tried Celexa in the recent past but this made her gain weight, so she stopped this medication  Possible kidney donation Patient has a friend who would benefit from a kidney donation, and she is interested in seeing if she is a Orthoptist.  She would like to get blood work today to get this process started.   PERTINENT  PMH / PSH: Anxiety, menopausal vaginal dryness, depression, restless legs  OBJECTIVE:   BP 124/64   Pulse 79   Wt 181 lb 9.6 oz (82.4 kg)   SpO2 94%   BMI 27.61 kg/m   General: well appearing, appears stated age Cardiac: RRR, no MRG Respiratory: CTAB, no rhonchi, rales, or wheezing, normal work of breathing Skin: no rashes or other lesions, warm and well perfused Psych: appropriate mood and affect   ASSESSMENT/PLAN:   Hot flashes, menopausal Will start Effexor today since there is sufficient evidence that this can help reduce menopausal hot flashes.  Counseled patient that the effect of this medication builds over time and encouraged her to take it even if she has not felt a change in the first few weeks.  Organ donation I am unsure how to proceed with starting the process of kidney donation.  I found a number that patient can call 3 [facility] organ donation center that may be able to guide her through the next steps.  I told her that I am happy to help her, but I will need further information from her about which labs to order.     Kathrene Alu, MD [place]

## 2019-11-29 NOTE — Patient Instructions (Addendum)
It was nice seeing you today [name]!  Let us try Effexor once daily for your hot flashes. This medication is slightly different class from Celexa so hopefully will not cause weight gain.  We can consider increasing this dose in the future if needed.  Since our clinic has not ordered labs for kidney donation before, we need some more information about what exactly is needed.  Living Kidney Donor  [facility]  [place], [place] [email or web address] > [email or web address] If you have questions or are interested in donation, call our donor coordinator at [phone]. You are born with two kidneys, but you only need one to lead a healthy life. Donating a kidney to someone whose health is failing is a selfless act. It is also a major life decision.  If you have any questions or concerns, please feel free to call the clinic.   Be well,  Dr. Shan Levans

## 2020-01-27 ENCOUNTER — Telehealth: Payer: Medicare Other

## 2020-01-27 ENCOUNTER — Other Ambulatory Visit: Payer: Self-pay

## 2020-01-27 ENCOUNTER — Encounter: Payer: Self-pay | Admitting: Family Medicine

## 2020-01-27 ENCOUNTER — Telehealth (INDEPENDENT_AMBULATORY_CARE_PROVIDER_SITE_OTHER): Payer: Medicare HMO | Admitting: Family Medicine

## 2020-01-27 DIAGNOSIS — Z5329 Procedure and treatment not carried out because of patient's decision for other reasons: Secondary | ICD-10-CM

## 2020-01-27 DIAGNOSIS — Z91199 Patient's noncompliance with other medical treatment and regimen due to unspecified reason: Secondary | ICD-10-CM

## 2020-01-27 NOTE — Progress Notes (Signed)
  Multiple attempts were made to call all available numbers in the chart.  The number ending in [place] did not appear to be functional.  The patient's husband was contacted but appeared to think that I was a telemarketer and was not interested in taking the call.

## 2020-01-28 ENCOUNTER — Telehealth (INDEPENDENT_AMBULATORY_CARE_PROVIDER_SITE_OTHER): Payer: Medicare HMO | Admitting: Family Medicine

## 2020-01-28 ENCOUNTER — Other Ambulatory Visit: Payer: Self-pay

## 2020-01-28 DIAGNOSIS — J069 Acute upper respiratory infection, unspecified: Secondary | ICD-10-CM | POA: Diagnosis not present

## 2020-01-28 MED ORDER — CETIRIZINE HCL 10 MG PO TABS: 10.0000 mg | ORAL_TABLET | Freq: Every day | ORAL | 6 refills | Status: DC

## 2020-01-28 MED ORDER — FLUTICASONE PROPIONATE 50 MCG/ACT NA SUSP: 2.0000 | Freq: Every day | NASAL | 6 refills | Status: DC

## 2020-01-28 NOTE — Assessment & Plan Note (Signed)
Most likely viral URI with possible seasonal allergy component. No fevers or SOB to suggest Pneumonia. Patient vaccinated against COVID fully since 12/30/2019. - Flonase daily to each nostril - Zyrtec 10mg  daily - Honey TID with Hot Tea - Humidifier  - Drink plenty of water to loosen secretions - Ibuprofen 200mg  and Tylenol 500mg  together every 8 hours (Patient with normal kidney function on BMP 3 months prior) - Strict return precautions given (fever, SOB, inability to tolerate PO), patient has extensive smoking history, concerned for possible development of pneumonia

## 2020-01-28 NOTE — Progress Notes (Signed)
[place] Telemedicine Visit  Patient consented to have virtual visit and was identified by name and date of birth. Method of visit: Video  Encounter participants: Patient: [name] - located at Home Provider: Daisy Floro - located at Clinic Others (if applicable): None  Chief Complaint:   HPI:  Viral URI: Last week started having runny and itchy eyes, coughing (productive, clear), sneezing, headaches (no vision changes), throat itchy, says she can taste her food. No fever, no vomiting, no sinus pressure, no pain in ears, no dizziness, no shortness of breath, no loss of sense of taste or smell, no difficulty with swallowing. She has been taking Cough and Cold for HBP (Antihistamine and DM), says it is sometimes helpful and sometimes not.  Sick contacts: none   Smoking: 1/2ppd "on a bad day", less than that on good days.   Vaccinated against [employer]) since 12/05/2019 and fully since December 30, 2019.   ROS: per HPI  Pertinent PMHx:  Patient Active Problem List   Diagnosis Date Noted  . Viral upper respiratory illness 01/28/2020  . Organ donation 11/29/2019  . Leg cramping 11/13/2019  . Menopausal vaginal dryness 11/13/2019  . Elevated serum creatinine 10/22/2019  . Close exposure to COVID-19 virus 05/03/2019  . Constipation 04/09/2019  . Chronic hand pain, right 03/26/2019  . Epidermoid cyst 10/29/2018  . Chronic bilateral low back pain without sciatica 02/08/2018  . Dyslipidemia 12/14/2017  . Fatigue 10/30/2017  . Oral herpes 10/19/2015  . Arthritis of spine 03/27/2015  . Lumbar spine pain 01/09/2015  . Depression 07/19/2013  . Acute lumbar back pain 03/30/2012  . Hot flashes, menopausal 02/28/2012  . Anxiety state 02/28/2012  . Restless legs 02/28/2012     Exam:  There were no vitals taken for this visit.  Respiratory: speaking in complete sentences, occasional cough appreciated  Assessment/Plan: Viral upper respiratory  illness Most likely viral URI with possible seasonal allergy component. No fevers or SOB to suggest Pneumonia. Patient vaccinated against COVID fully since 12/30/2019. - Flonase daily to each nostril - Zyrtec 10mg  daily - Honey TID with Hot Tea - Humidifier  - Drink plenty of water to loosen secretions - Ibuprofen 200mg  and Tylenol 500mg  together every 8 hours (Patient with normal kidney function on BMP 3 months prior) - Strict return precautions given (fever, SOB, inability to tolerate PO), patient has extensive smoking history, concerned for possible development of pneumonia   Time spent during visit with patient: 21 minutes   Milus Banister, [place], PGY-2 01/28/2020 9:39 AM

## 2020-02-19 ENCOUNTER — Other Ambulatory Visit: Payer: Self-pay

## 2020-02-19 ENCOUNTER — Encounter: Payer: Self-pay | Admitting: Family Medicine

## 2020-02-19 ENCOUNTER — Ambulatory Visit (INDEPENDENT_AMBULATORY_CARE_PROVIDER_SITE_OTHER): Payer: Medicare HMO | Admitting: Family Medicine

## 2020-02-19 DIAGNOSIS — R69 Illness, unspecified: Secondary | ICD-10-CM | POA: Diagnosis not present

## 2020-02-19 DIAGNOSIS — F411 Generalized anxiety disorder: Secondary | ICD-10-CM

## 2020-02-19 MED ORDER — VENLAFAXINE HCL ER 37.5 MG PO CP24: 37.5000 mg | ORAL_CAPSULE | Freq: Every day | ORAL | 11 refills | Status: DC

## 2020-02-19 NOTE — Patient Instructions (Signed)
Panic Attack  A panic attack is when you suddenly feel very afraid, uncomfortable, or nervous (anxious). A panic attack can happen when you are scared or for no reason. A panic attack can feel like a serious problem. It can even feel like a heart attack or stroke. See your doctor when you have a panic attack to make sure you do not have a serious problem. Follow these instructions at home:  Take medicines only as told by your doctor.  If you feel worried or nervous, try not to have caffeine.  Take good care of your health. To do this: ? Eat healthy. Make sure to eat fresh fruits and vegetables, whole grains, lean meats, and low-fat dairy. ? Get enough sleep. Try to sleep for 7-8 hours each night. ? Exercise. Try to be active for 30 minutes 5 or more days a week. ? Do not smoke. Talk to your doctor if you need help quitting. ? Limit how much alcohol you drink:  If you are a woman who is not pregnant: try not to have more than 1 drink a day.  If you are a man: try not to have more than 2 drinks a day.  One drink equals 12 oz of beer, 5 oz of wine, or 1 oz of hard liquor.  Keep all follow-up visits as told by your doctor. This is important. Contact a doctor if:  Your symptoms do not get better.  Your symptoms get worse.  You are not able to take your medicines as told. Get help right away if:  You have thoughts of hurting yourself or others.  You have symptoms of a panic attack. Do not drive yourself to the hospital. Have someone else drive you or call an ambulance. If you feel like you may hurt yourself or others, or have thoughts about taking your own life, get help right away. You can go to your nearest emergency department or call:  Your local emergency services (911 in the [place]  A suicide crisis helpline, such as the [facility] at (409) 331-9394. This is open 24 hours a day. Summary  A panic attack is when you suddenly feel very afraid,  uncomfortable, or nervous (anxious).  See your doctor when you have a panic attack to make sure that you do not have another serious problem.  If you feel like you may hurt yourself or others, get help right away by calling 911. This information is not intended to replace advice given to you by your health care provider. Make sure you discuss any questions you have with your health care provider. Document Revised: 08/11/2017 Document Reviewed: 10/12/2016 Elsevier Patient Education  2020 [employer].

## 2020-02-19 NOTE — Progress Notes (Signed)
° ° °  SUBJECTIVE:   CHIEF COMPLAINT / HPI:   Panic attacks Patient reports desire for medication refills 2/2 panic attacks. Had 2 panic attacks. One was at her granddaughters graduation. Patient describes SOB. Attempted deep breathing techniques which did not help much. Felt her HR was elevated, paramedics said it was 99. Was praying the entire time. Paramedics arrived and told her it was a panic attacks.   Patient has a h/o panic attacks. Does not get them often anymore. Patient is currently on hydroxazine and venlafaxine for anxiety. Patient was out of venlafaxine. Patient mainly uses prayer as a way of coping with anxiety.   GAD 7 : Generalized Anxiety Score 02/19/2020 12/11/2015  Nervous, Anxious, on Edge 0 0  Control/stop worrying 1 2  Worry too much - different things 0 2  Trouble relaxing 1 2  Restless 0 0  Easily annoyed or irritable 0 0  Afraid - awful might happen 0 0  Total GAD 7 Score 2 6  Anxiety Difficulty Not difficult at all -     PERTINENT  PMH / PSH: anxiety, depression   OBJECTIVE:   BP 114/62    Pulse (!) 104    Wt 178 lb 3.2 oz (80.8 kg)    SpO2 92%    BMI 27.10 kg/m   Gen: awake and alert, NAD Cardio: regular rate, pulse 70 on manual check Resp: CTAB, no wheezes, rales or rhonchi Ext: no edema Psych: normal affect  ASSESSMENT/PLAN:   Anxiety state Well controlled on current regimen of venlafaxine and hydroxyzine PRN. Patient has been out of venlafaxine but has hydroxyzine at home. Given good control will refill venlafaxine. Advised f/u in 1 month to ensure patient is not having frequent panic attacks, sooner if worsening. No SI/HI     [name], [place]

## 2020-02-19 NOTE — Assessment & Plan Note (Addendum)
Well controlled on current regimen of venlafaxine and hydroxyzine PRN. Patient has been out of venlafaxine but has hydroxyzine at home. Given good control will refill venlafaxine. Advised f/u in 1 month to ensure patient is not having frequent panic attacks, sooner if worsening. No SI/HI

## 2020-02-21 ENCOUNTER — Other Ambulatory Visit: Payer: Self-pay | Admitting: Family Medicine

## 2020-02-21 DIAGNOSIS — M545 Low back pain, unspecified: Secondary | ICD-10-CM

## 2020-03-01 DIAGNOSIS — M1990 Unspecified osteoarthritis, unspecified site: Secondary | ICD-10-CM | POA: Diagnosis not present

## 2020-03-01 DIAGNOSIS — R69 Illness, unspecified: Secondary | ICD-10-CM | POA: Diagnosis not present

## 2020-03-01 DIAGNOSIS — F419 Anxiety disorder, unspecified: Secondary | ICD-10-CM | POA: Diagnosis not present

## 2020-03-01 DIAGNOSIS — B009 Herpesviral infection, unspecified: Secondary | ICD-10-CM | POA: Diagnosis not present

## 2020-03-01 DIAGNOSIS — J309 Allergic rhinitis, unspecified: Secondary | ICD-10-CM | POA: Diagnosis not present

## 2020-03-01 DIAGNOSIS — L309 Dermatitis, unspecified: Secondary | ICD-10-CM | POA: Diagnosis not present

## 2020-03-01 DIAGNOSIS — G4700 Insomnia, unspecified: Secondary | ICD-10-CM | POA: Diagnosis not present

## 2020-03-01 DIAGNOSIS — R030 Elevated blood-pressure reading, without diagnosis of hypertension: Secondary | ICD-10-CM | POA: Diagnosis not present

## 2020-03-01 DIAGNOSIS — G2581 Restless legs syndrome: Secondary | ICD-10-CM | POA: Diagnosis not present

## 2020-03-01 DIAGNOSIS — E785 Hyperlipidemia, unspecified: Secondary | ICD-10-CM | POA: Diagnosis not present

## 2020-04-09 ENCOUNTER — Other Ambulatory Visit: Payer: Self-pay | Admitting: Family Medicine

## 2020-04-09 DIAGNOSIS — Z1231 Encounter for screening mammogram for malignant neoplasm of breast: Secondary | ICD-10-CM

## 2020-04-10 ENCOUNTER — Other Ambulatory Visit: Payer: Self-pay | Admitting: Family Medicine

## 2020-04-21 ENCOUNTER — Ambulatory Visit
Admission: RE | Admit: 2020-04-21 | Discharge: 2020-04-21 | Disposition: A | Discharge status: Home / Self Care | Payer: Medicare HMO | Source: Ambulatory Visit | Attending: Ophthalmology | Admitting: Ophthalmology

## 2020-04-21 ENCOUNTER — Other Ambulatory Visit: Payer: Self-pay

## 2020-04-21 DIAGNOSIS — Z1231 Encounter for screening mammogram for malignant neoplasm of breast: Secondary | ICD-10-CM | POA: Diagnosis not present

## 2020-05-27 NOTE — Telephone Encounter (Signed)
Created in error. Dresean Beckel, OTR/L

## 2020-06-15 ENCOUNTER — Ambulatory Visit: Payer: Medicare Other

## 2020-06-17 ENCOUNTER — Other Ambulatory Visit: Payer: Self-pay

## 2020-06-17 ENCOUNTER — Ambulatory Visit (INDEPENDENT_AMBULATORY_CARE_PROVIDER_SITE_OTHER): Payer: Medicare Other

## 2020-06-17 DIAGNOSIS — Z23 Encounter for immunization: Secondary | ICD-10-CM

## 2020-06-17 NOTE — Progress Notes (Signed)
Patient presents in nurse clinic for Flu Vaccine.  Vaccine administered LD without complications.   See admin for details.

## 2020-06-23 ENCOUNTER — Ambulatory Visit (INDEPENDENT_AMBULATORY_CARE_PROVIDER_SITE_OTHER): Payer: Medicare Other | Admitting: Family Medicine

## 2020-06-23 ENCOUNTER — Encounter: Payer: Self-pay | Admitting: Family Medicine

## 2020-06-23 ENCOUNTER — Other Ambulatory Visit: Payer: Self-pay

## 2020-06-23 VITALS — BP 130/62 | HR 109 | Ht 68.0 in | Wt 174.4 lb

## 2020-06-23 DIAGNOSIS — K5900 Constipation, unspecified: Secondary | ICD-10-CM

## 2020-06-23 DIAGNOSIS — L299 Pruritus, unspecified: Secondary | ICD-10-CM

## 2020-06-23 DIAGNOSIS — N3944 Nocturnal enuresis: Secondary | ICD-10-CM | POA: Insufficient documentation

## 2020-06-23 DIAGNOSIS — E559 Vitamin D deficiency, unspecified: Secondary | ICD-10-CM

## 2020-06-23 DIAGNOSIS — F411 Generalized anxiety disorder: Secondary | ICD-10-CM | POA: Diagnosis not present

## 2020-06-23 DIAGNOSIS — F17200 Nicotine dependence, unspecified, uncomplicated: Secondary | ICD-10-CM

## 2020-06-23 DIAGNOSIS — R5383 Other fatigue: Secondary | ICD-10-CM | POA: Diagnosis not present

## 2020-06-23 DIAGNOSIS — E785 Hyperlipidemia, unspecified: Secondary | ICD-10-CM | POA: Diagnosis not present

## 2020-06-23 DIAGNOSIS — Z23 Encounter for immunization: Secondary | ICD-10-CM | POA: Diagnosis not present

## 2020-06-23 DIAGNOSIS — Z0000 Encounter for general adult medical examination without abnormal findings: Secondary | ICD-10-CM | POA: Diagnosis not present

## 2020-06-23 DIAGNOSIS — G4700 Insomnia, unspecified: Secondary | ICD-10-CM

## 2020-06-23 DIAGNOSIS — R7989 Other specified abnormal findings of blood chemistry: Secondary | ICD-10-CM | POA: Diagnosis not present

## 2020-06-23 DIAGNOSIS — R7309 Other abnormal glucose: Secondary | ICD-10-CM | POA: Diagnosis not present

## 2020-06-23 DIAGNOSIS — Z87891 Personal history of nicotine dependence: Secondary | ICD-10-CM

## 2020-06-23 HISTORY — DX: Nocturnal enuresis: N39.44

## 2020-06-23 MED ORDER — CARBAMIDE PEROXIDE 6.5 % OT SOLN: 5.0000 [drp] | Freq: Two times a day (BID) | OTIC | 0 refills | Status: DC

## 2020-06-23 MED ORDER — MELATONIN ER 10 MG PO TBCR: 10.0000 mg | EXTENDED_RELEASE_TABLET | Freq: Every day | ORAL | 2 refills | Status: DC

## 2020-06-23 MED ORDER — HYDROXYZINE HCL 50 MG PO TABS: 25.0000 mg | ORAL_TABLET | Freq: Every evening | ORAL | 2 refills | Status: DC | PRN

## 2020-06-23 MED ORDER — DOCUSATE SODIUM 100 MG PO CAPS: 100.0000 mg | ORAL_CAPSULE | Freq: Two times a day (BID) | ORAL | 1 refills | Status: DC

## 2020-06-23 NOTE — Assessment & Plan Note (Signed)
-   Scheduled low-dose CT screen for lung ca today given 40-60 pack-year smoking history and still currently smoking - Referred for DEXA scan given age, previous fx, and smoking history/current smoker - PCV13 vaccine given today, will f/u in 8 weeks for second dose.

## 2020-06-23 NOTE — Assessment & Plan Note (Signed)
Prescribed melatonin ER 10 mg to replace melatonin gummies at home of unknown dose

## 2020-06-23 NOTE — Assessment & Plan Note (Addendum)
Overnight "leaking" of unknown source, likely urine. Most likely from qHS hydroxyzine, reduced dose from 50mg  to 25 mg. Pt unable to give urine sample today to r/o UTI.

## 2020-06-23 NOTE — Addendum Note (Signed)
Addended by: Katharina Caper, Alette Kataoka D on: 06/23/2020 05:11 PM   Modules accepted: Orders, SmartSet

## 2020-06-23 NOTE — Progress Notes (Signed)
SUBJECTIVE:   CHIEF COMPLAINT / HPI:   Annual physical #Smoking: 40 to 60 pack year history and still currently smoking.  Started at 57, has been smoking at least 1 pack/day for over 30 years.  Oftentimes will smoke 1.5 packs/day.  Has never had low-dose CT screening, will schedule today.  #Cholesterol: History of dyslipidemia on Lipitor, last lipid panel 03/2019 within normal limits.  #Done density: Age 64, history of previous fracture, high pack year smoking history, still currently smoking.  No prior bone density imaging studies.  Will refer for DEXA scan.  #Age-appropriate vaccination: Patient will receive PCV 13 first dose today, return in 8 weeks for second dose.  Patient has already been fully vaccinated against Covid with booster, and already received this years flu vaccine.  Fatigue Chronic fatigue for "years.  Patient experiencing insomnia after right hand injury in 2017.  She has tried reducing screen time and such in the spirit of good sleep hygiene but reports it has never worked well for her.  Trouble falling asleep and staying asleep.  Exams is chronic right hand pain and lower back pain.  Does endorse frequent anxiety, both situational and occasional panic attacks.  Denies anhedonia, SI/HI, other depressive symptoms.  No shortness of breath.  This fatigue causes her to sleep "quite a bit" but does not prevent her from doing ADLs or other daily activities.  "Leakage overnight" For the past 3 years, has woken up to "leakage" every night.  She is unsure what it is, as a liquid is colorless and odorless.  Volume enough to warrant pantiliner or light pad.  She denies symptoms of urinary incontinence, both urge and stress.  Does experience sweating at bedtime and overnight from intermittent hot flashes that sometimes wake her from sleep.  Uses Premarin cream once a week.  PERTINENT  PMH / PSH: Current smoker, hot flashes, arthritis of spine, lumbar back pain, menopausal vaginal  dryness, anxiety, restless leg, leg cramping, fatigue, dyslipidemia, chronic right hand pain, constipation  OBJECTIVE:   BP 130/62   Pulse (!) 109   Ht 5\' 8"  (1.727 m)   Wt 174 lb 6.4 oz (79.1 kg)   SpO2 94%   BMI 26.52 kg/m     PHQ-9:  Depression screen [facility] 2/9 06/23/2020 02/19/2020 01/28/2020  Decreased Interest 0 1 0  Down, Depressed, Hopeless 0 0 0  PHQ - 2 Score 0 1 0  Altered sleeping 3 - -  Tired, decreased energy 3 - -  Change in appetite 0 - -  Feeling bad or failure about yourself  0 - -  Trouble concentrating 0 - -  Moving slowly or fidgety/restless 0 - -  Suicidal thoughts 0 - -  PHQ-9 Score 6 - -  Some recent data might be hidden     GAD-7:  GAD 7 : Generalized Anxiety Score 02/19/2020 12/11/2015  Nervous, Anxious, on Edge 0 0  Control/stop worrying 1 2  Worry too much - different things 0 2  Trouble relaxing 1 2  Restless 0 0  Easily annoyed or irritable 0 0  Afraid - awful might happen 0 0  Total GAD 7 Score 2 6  Anxiety Difficulty Not difficult at all -      Physical Exam General: Awake, alert, oriented Cardiovascular: Regular rate and rhythm, S1 and S2 present, no murmurs auscultated Respiratory: Lung fields clear to auscultation bilaterally Extremities: No bilateral lower extremity edema Neuro: Cranial nerves II through X grossly intact, able to move  all extremities spontaneously   ASSESSMENT/PLAN:   Healthcare maintenance - Scheduled low-dose CT screen for lung ca today given 40-60 pack-year smoking history and still currently smoking - Referred for DEXA scan given age, previous fx, and smoking history/current smoker - PCV13 vaccine given today, will f/u in 8 weeks for second dose.   Fatigue Prescribed melatonin ER 10 mg to replace melatonin gummies at home of unknown dose  Nocturnal enuresis Overnight "leaking" of unknown source, likely urine. Most likely from qHS hydroxyzine, reduced dose from 50mg  to 25 mg. Pt unable to give urine sample  today to r/o UTI.      [name], MD [place]

## 2020-06-23 NOTE — Patient Instructions (Addendum)
It was wonderful to meet you today. Thank you for allowing me to be a part of your care. Below is a short summary of what we discussed at your visit today:  Annual physical exam Thank you for coming in for physical exam.  Today we administered the pneumococcal vaccine.  You will need to come back in 8 weeks for the second dose.  Today we are also getting some blood work, I will send you letter to let you know abnormal results.  I am also referring you to have a DEXA bone scan and a low-dose CT screening for lung cancer.  Someone should contact you soon to schedule these.   We have scheduled your low-dose CT screening for lung cancer.  The nurse will provide you with the appointment card and information.  Overnight leakage of unknown type Instead of taking 50 mg hydroxyzine, start taking 25 mg (half tablet) at night.  This may be causing some of the leakage.  Please come back and let us know if it worsens redevelop symptoms of urinary tract infection like pain or stinging with urination, lower abdominal pain, or pain after under back.  Fatigue I will send in that melatonin extended release we talked about.  I am also checking your hemoglobin to see if you are anemic, as this could cause fatigue.  Overall, everything we talked about makes me think that you do not have any high risk conditions that would be causing fatigue.   If you have any questions or concerns, please do not hesitate to contact us via phone or MyChart message.   [name], MD

## 2020-06-24 LAB — BASIC METABOLIC PANEL
BUN/Creatinine Ratio: 19 (ref 12–28)
BUN: 19 mg/dL (ref 8–27)
CO2: 24 mmol/L (ref 20–29)
Calcium: 9.3 mg/dL (ref 8.7–10.3)
Chloride: 103 mmol/L (ref 96–106)
Creatinine, Ser: 0.99 mg/dL (ref 0.57–1.00)
GFR calc Af Amer: 69 mL/min/{1.73_m2} (ref >59)
GFR calc non Af Amer: 60 mL/min/{1.73_m2} (ref >59)
Glucose: 55 mg/dL — ABNORMAL LOW (ref 65–99)
Potassium: 3.7 mmol/L (ref 3.5–5.2)
Sodium: 142 mmol/L (ref 134–144)

## 2020-06-24 LAB — CBC WITH DIFFERENTIAL/PLATELET
Basophils Absolute: 0.1 10*3/uL (ref 0.0–0.2)
Basos: 1 %
EOS (ABSOLUTE): 0.2 10*3/uL (ref 0.0–0.4)
Eos: 2 %
Hematocrit: 43.6 % (ref 34.0–46.6)
Hemoglobin: 14.1 g/dL (ref 11.1–15.9)
Immature Grans (Abs): 0 10*3/uL (ref 0.0–0.1)
Immature Granulocytes: 0 %
Lymphocytes Absolute: 2.2 10*3/uL (ref 0.7–3.1)
Lymphs: 21 %
MCH: 27.8 pg (ref 26.6–33.0)
MCHC: 32.3 g/dL (ref 31.5–35.7)
MCV: 86 fL (ref 79–97)
Monocytes Absolute: 0.8 10*3/uL (ref 0.1–0.9)
Monocytes: 8 %
Neutrophils Absolute: 7.4 10*3/uL — ABNORMAL HIGH (ref 1.4–7.0)
Neutrophils: 68 %
Platelets: 366 10*3/uL (ref 150–450)
RBC: 5.08 x10E6/uL (ref 3.77–5.28)
RDW: 13.2 % (ref 11.7–15.4)
WBC: 10.7 10*3/uL (ref 3.4–10.8)

## 2020-06-24 LAB — VITAMIN D 25 HYDROXY (VIT D DEFICIENCY, FRACTURES): Vit D, 25-Hydroxy: 24.6 ng/mL — ABNORMAL LOW (ref 30.0–100.0)

## 2020-06-24 LAB — HEMOGLOBIN A1C
Est. average glucose Bld gHb Est-mCnc: 123 mg/dL
Hgb A1c MFr Bld: 5.9 % — ABNORMAL HIGH (ref 4.8–5.6)

## 2020-06-24 LAB — TSH: TSH: 3.24 u[IU]/mL (ref 0.450–4.500)

## 2020-06-26 ENCOUNTER — Telehealth: Payer: Self-pay | Admitting: Family Medicine

## 2020-06-26 NOTE — Telephone Encounter (Signed)
Called Ms. Kirkey about her test results.   Vit. D was a little low at 24.6, advised her to take her vit. D supplement daily with plan for f/u appt and recheck lab in 3 months.   A1c was in the prediabetic range at 5.9. We discussed diet and exercise changes being the first line treatment for this. She agreed to a 3 month follow up where we rechecked her A1c.   Other labs wnl. Discussed those with her as well.   [name], MD

## 2020-07-07 ENCOUNTER — Ambulatory Visit
Admission: RE | Admit: 2020-07-07 | Discharge: 2020-07-07 | Disposition: A | Discharge status: Home / Self Care | Payer: Medicare Other | Source: Ambulatory Visit | Attending: Family Medicine | Admitting: Family Medicine

## 2020-07-07 DIAGNOSIS — F17200 Nicotine dependence, unspecified, uncomplicated: Secondary | ICD-10-CM

## 2020-07-07 DIAGNOSIS — Z87891 Personal history of nicotine dependence: Secondary | ICD-10-CM | POA: Diagnosis not present

## 2020-07-07 DIAGNOSIS — I700 Atherosclerosis of aorta: Secondary | ICD-10-CM | POA: Diagnosis not present

## 2020-07-07 DIAGNOSIS — J984 Other disorders of lung: Secondary | ICD-10-CM | POA: Diagnosis not present

## 2020-07-07 DIAGNOSIS — J439 Emphysema, unspecified: Secondary | ICD-10-CM | POA: Diagnosis not present

## 2020-07-08 ENCOUNTER — Telehealth: Payer: Self-pay | Admitting: Family Medicine

## 2020-07-08 ENCOUNTER — Telehealth: Payer: Self-pay

## 2020-07-08 ENCOUNTER — Other Ambulatory Visit: Payer: Self-pay | Admitting: Family Medicine

## 2020-07-08 DIAGNOSIS — R918 Other nonspecific abnormal finding of lung field: Secondary | ICD-10-CM

## 2020-07-08 DIAGNOSIS — Z87891 Personal history of nicotine dependence: Secondary | ICD-10-CM

## 2020-07-08 NOTE — Telephone Encounter (Signed)
Called to discuss suspicious results of low-dose CT lung cancer screening. Discussed with patient urgent referral to thoracic surgery or pulmonology for biopsy and next steps. Waiting to discuss proper referral location with [name], [facility]. Will place referral asap.   [name] voiced understanding. She declined coming in to the office today to discuss this result with Dr. Owens Shark. I will call her tomorrow to check in and again offer in-clinic counseling.   [name], MD

## 2020-07-08 NOTE — Progress Notes (Signed)
Spoke with [name] on the phone regarding appropriate referral location for [name]'s suspicious results of lung CT screening. He asked I refer to him in pulm clinic. I've placed the referral and per his request will send him a staff message with Ms. Whitmire's information, which he will then forward to his scheduler.   I've already spoken with Ms. Keelin and made her aware. Previous phone note documenting our conversation.   [name], MD

## 2020-07-08 NOTE — Telephone Encounter (Signed)
[facility] calls nurse line wanting to make provider aware of CT result.

## 2020-07-09 ENCOUNTER — Telehealth: Payer: Self-pay | Admitting: Pulmonary Disease

## 2020-07-09 NOTE — Telephone Encounter (Signed)
-----   Message from [name], DO sent at 07/08/2020 10:24 AM EDT ----- Regarding: RE: Please sched asap - suspicious bibasilar lung nodules Patrice,   Please schedule with me next Wednesday in clinic for lung mass/nodule slot 3min consult.   Drs Jeani Hawking & Owens Shark - thanks for the referral. I will get her scheduled to discuss bronchoscopy and tissue sample. Please feel free to reach out any time.   Leroy Sea   Cell: [phone]  (staff messages/secure chat works great too)  ----- Message ----- From: [name], MD Sent: 07/08/2020  10:14 AM EDT To: [name], DO, Martyn Malay, MD Subject: Please sched asap - suspicious bibasilar lun#  [name],   Thank you for being so quick to return my call this morning! I really appreciate it. Here is my patient's information. I recently saw her for an annual physical and referred for low-dose CT lung screen. Results came back as Lung-RADS 4B: "Dominant irregular solid and sub solid pulmonary lesions identified in the lower lobes bilaterally, concerning for multifocal adenocarcinoma."  Smoking history: 40 to 60 pack-year history and still currently smoking.  Started at 49, has been smoking at least 1 pack/day for over 30 years.  Oftentimes will smoke 1.5 packs/day.  No prior lung cancer screening.   Thank you again. Please let me know if there is anything else I can do to facilitate Ms. Unangst's care.   [name], MD  P.S. - I've cc'd Dr. Dorris Singh, [facility] attending with whom I precepted the patient this morning, for continuity purposes.

## 2020-07-09 NOTE — Telephone Encounter (Signed)
Called and left message on pt vm to call back to schedule consult with [name] on Wednesday -pr

## 2020-07-10 ENCOUNTER — Telehealth: Payer: Self-pay | Admitting: Family Medicine

## 2020-07-10 ENCOUNTER — Encounter: Payer: Self-pay | Admitting: Family Medicine

## 2020-07-10 ENCOUNTER — Other Ambulatory Visit: Payer: Self-pay

## 2020-07-10 ENCOUNTER — Ambulatory Visit (INDEPENDENT_AMBULATORY_CARE_PROVIDER_SITE_OTHER): Payer: Medicare Other | Admitting: Family Medicine

## 2020-07-10 DIAGNOSIS — R918 Other nonspecific abnormal finding of lung field: Secondary | ICD-10-CM

## 2020-07-10 DIAGNOSIS — F411 Generalized anxiety disorder: Secondary | ICD-10-CM

## 2020-07-10 HISTORY — DX: Other nonspecific abnormal finding of lung field: R91.8

## 2020-07-10 MED ORDER — HYDROXYZINE HCL 50 MG PO TABS: 25.0000 mg | ORAL_TABLET | Freq: Every evening | ORAL | 2 refills | Status: DC | PRN

## 2020-07-10 NOTE — Assessment & Plan Note (Signed)
Discussed results in general today. Discussed next steps to see [name], discuss methods of potential biopsy and next steps.  Supportive listening provided, offered therapy resources. Could consider referral to [name] to therapy at follow up. Called Dr. Juline Patch office to schedule Wednesday visit.

## 2020-07-10 NOTE — Telephone Encounter (Signed)
Received message from patient's primary physician that the patient was discussed along CT results in person with her mom.  Called patient and discussed.  Number provided to lobar pulmonary she wishes to me this afternoon at 2 PM.  Patient will be at 2 PM to discuss results. Dorris Singh, MD  Family Medicine Teaching Service

## 2020-07-10 NOTE — Progress Notes (Signed)
    SUBJECTIVE:   CHIEF COMPLAINT / HPI:  [name] is a pleasant 64 year old woman with history significant for tobacco abuse presenting today to discuss results. She is joined by her mother. She recently had a low dose CT. This demonstrated two areas of concern, possible adenocarcinoma (not comfirmed). Patient wished to review imaging results and next steps in person.  The patient reports feeling a little overwhelmed. She has good family support. She is very spiritual, finds strength in prayer. Endorses poor sleep and fatigue. No SI/HI. Reports other than feeling overwhelmed she is doing 'okay'. Feels like she did not expect this but is glad [name] recommended low dose CT. She endorses fatigue but otherwise feels okay today.  She has a history of mood disorder and takes venlafaxine which she continues on and hydroxyzine at night.  She denies side effects from either of these medications and continues to take these.  She needs a refill on her antianxiety medication.  She only takes it a few times a week.  PERTINENT  PMH / PSH/Family/Social History : Updated and reviewed as appropriate.  The patient was previously a Print production planner.  OBJECTIVE:   Presents today with mother, tearful but appropriate throughout  Cardiac: Warm well perfused.  Capillary refill less than 3 seconds Respiratory breathing comfortably on room air Psych: Pleasant normal affect, appropriate, normal rate of speech  ASSESSMENT/PLAN:   Abnormal CT lung screening Discussed results in general today. Discussed next steps to see [name], discuss methods of potential biopsy and next steps.  Supportive listening provided, offered therapy resources. Could consider referral to [name] to therapy at follow up. Called Dr. Juline Patch office to schedule Wednesday visit.     All questions answered, patient to call to schedule follow up with [name] in next 1-2 months.   Dorris Singh, [place]

## 2020-07-13 DIAGNOSIS — C3432 Malignant neoplasm of lower lobe, left bronchus or lung: Secondary | ICD-10-CM

## 2020-07-13 HISTORY — DX: Malignant neoplasm of lower lobe, left bronchus or lung: C34.32

## 2020-07-14 NOTE — Telephone Encounter (Signed)
Pt has been scheduled for 11/3 to see [name]. Will sign off.

## 2020-07-15 ENCOUNTER — Other Ambulatory Visit: Payer: Self-pay

## 2020-07-15 ENCOUNTER — Ambulatory Visit (INDEPENDENT_AMBULATORY_CARE_PROVIDER_SITE_OTHER): Payer: Medicare Other | Admitting: Pulmonary Disease

## 2020-07-15 ENCOUNTER — Encounter: Payer: Self-pay | Admitting: Pulmonary Disease

## 2020-07-15 VITALS — BP 126/72 | HR 78 | Temp 97.0°F | Ht 67.0 in | Wt 173.0 lb

## 2020-07-15 DIAGNOSIS — R918 Other nonspecific abnormal finding of lung field: Secondary | ICD-10-CM | POA: Diagnosis not present

## 2020-07-15 DIAGNOSIS — C3490 Malignant neoplasm of unspecified part of unspecified bronchus or lung: Secondary | ICD-10-CM

## 2020-07-15 DIAGNOSIS — F17210 Nicotine dependence, cigarettes, uncomplicated: Secondary | ICD-10-CM | POA: Diagnosis not present

## 2020-07-15 DIAGNOSIS — F17200 Nicotine dependence, unspecified, uncomplicated: Secondary | ICD-10-CM

## 2020-07-15 DIAGNOSIS — J449 Chronic obstructive pulmonary disease, unspecified: Secondary | ICD-10-CM | POA: Diagnosis not present

## 2020-07-15 DIAGNOSIS — J432 Centrilobular emphysema: Secondary | ICD-10-CM | POA: Diagnosis not present

## 2020-07-15 NOTE — H&P (View-Only) (Signed)
Synopsis: Referred in November 2021 for abnormal CT chest .  By [name], MD  Subjective:   PATIENT ID: [name] GENDER: female DOB: Sep 28, 1955, MRN: [id number]  Chief Complaint  Patient presents with  . Consult    pt here to discuss lung nodules    this is a 64 year old female, past medical history of depression, anxiety, current tobacco abuse.  She initially seen by her primary care provider which encouraged her to have a lung cancer screening CT.  She had this completed in October 2021.  The CT scan revealed bilateral lower lobe cystic peripheral based lesions both greater than 4 cm in size concerning for multifocal adenocarcinoma.  Patient has no respiratory symptoms at this time.  She is a current smoker.  She has smoked for 41 years at approximately 1 pack/day.  She presents today in the office with her husband and her daughter via phone.  Long discussion today regarding CT imaging as well as next appropriate steps.  Patient denies fevers chills night sweats weight loss or hemoptysis.   Past Medical History:  Diagnosis Date  . Allergy    seasonal  . Anxiety 2009  . Arthritis 2000  . Depression   . Depression 07/19/2013  . Fibroid 2003  . Organ donation 11/29/2019  . Wears glasses      Family History  Problem Relation Age of Onset  . Hypertension Mother   . Hyperlipidemia Mother   . Early death Sister 47       Drug overdose  . Dementia Father   . Cancer Father        Throat cancer   . Parkinson's disease Father   . Hypertension Daughter   . Diabetes Daughter   . Cancer Brother        Throat     Past Surgical History:  Procedure Laterality Date  . ARTHROTOMY Right 11/09/2015   Procedure: RIGHT WRIST DISTAL RADIAL ULNAR JOINT ARTHROTOMY AND DEBRIDEMENT,  AND ;  Surgeon: Iran Planas, MD;  Location: [facility];  Service: Orthopedics;  Laterality: Right;  . COLONOSCOPY    . HARDWARE REMOVAL Right 07/24/2013   Procedure: RIGHT WRIST HARDWARE REMOVAL, JOINT  RELEASE, RIGHT HAND MANIPULATION UNDER ANESTHESIA;  Surgeon: Linna Hoff, MD;  Location: [facility];  Service: Orthopedics;  Laterality: Right;  . NECK SURGERY  2003   Cervical vertebroplasty   . OPEN REDUCTION INTERNAL FIXATION (ORIF) DISTAL RADIAL FRACTURE Right 11/08/2012   Procedure: OPEN REDUCTION INTERNAL FIXATION (ORIF) RIGHT DISTAL RADIUS FRACTURE;  Surgeon: Linna Hoff, MD;  Location: [facility];  Service: Orthopedics;  Laterality: Right;  . WRIST ARTHROPLASTY Right 11/09/2015   Procedure: OR DISTAL ULNAR RESECTION ARTHROPLASTY ;  Surgeon: Iran Planas, MD;  Location: [facility];  Service: Orthopedics;  Laterality: Right;  . WRIST ARTHROSCOPY WITH DEBRIDEMENT Right 07/21/2014   Procedure: RIGHT WRIST DISTAL RADIAL ULNAR JOINT DEBRIDEMENT AND JOINT RELEASE POSSIBLE TENDON INTERPOSITION;  Surgeon: Linna Hoff, MD;  Location: [facility];  Service: Orthopedics;  Laterality: Right;    Social History   Socioeconomic History  . Marital status: Married    Spouse name: Leiliana Foody  . Number of children: 2  . Years of education: 97  . Highest education level: Not on file  Occupational History  . Occupation: Unemployed     Employer:  [employer]    Comment: Sweep   Tobacco Use  . Smoking status: Current Every Day Smoker    Packs/day: 1.00  Years: 41.00    Pack years: 41.00    Types: Cigarettes    Start date: 04/13/1975  . Smokeless tobacco: Never Used  Vaping Use  . Vaping Use: Never used  Substance and Sexual Activity  . Alcohol use: No    Comment: Holidays only  . Drug use: No  . Sexual activity: Not Currently    Partners: Male    Birth control/protection: None, Post-menopausal  Other Topics Concern  . Not on file  Social History Narrative   Patient lives alone currently in [place].    Patient is married, they are living separately at this time.    Patient enjoys watching tv, cooking, and spending time with her children and grandchildren.     Social Determinants of Health   Financial Resource Strain: Low Risk   . Difficulty of Paying Living Expenses: Not hard at all  Food Insecurity: No Food Insecurity  . Worried About Charity fundraiser in the Last Year: Never true  . Ran Out of Food in the Last Year: Never true  Transportation Needs: No Transportation Needs  . Lack of Transportation (Medical): No  . Lack of Transportation (Non-Medical): No  Physical Activity: Inactive  . Days of Exercise per Week: 0 days  . Minutes of Exercise per Session: 0 min  Stress: No Stress Concern Present  . Feeling of Stress : Not at all  Social Connections: Moderately Isolated  . Frequency of Communication with Friends and Family: More than three times a week  . Frequency of Social Gatherings with Friends and Family: More than three times a week  . Attends Religious Services: Never  . Active Member of Clubs or Organizations: No  . Attends Archivist Meetings: Never  . Marital Status: Married  Human resources officer Violence: Not At Risk  . Fear of Current or Ex-Partner: No  . Emotionally Abused: No  . Physically Abused: No  . Sexually Abused: No     Allergies  Allergen Reactions  . Codeine Nausea And Vomiting and Other (See Comments)    Hallucinations     Outpatient Medications Prior to Visit  Medication Sig Dispense Refill  . acetaminophen (TYLENOL) 650 MG CR tablet Take 650 mg by mouth every 8 (eight) hours as needed for pain.    Marland Kitchen atorvastatin (LIPITOR) 40 MG tablet Take 1 tablet (40 mg total) by mouth daily. 90 tablet 3  . carbamide peroxide (DEBROX) 6.5 % OTIC solution Place 5 drops into both ears 2 (two) times daily. 15 mL 0  . cetirizine (ZYRTEC) 10 MG tablet Take 1 tablet (10 mg total) by mouth daily. 30 tablet 6  . Cholecalciferol (VITAMIN D) 2000 units CAPS Take 1 capsule (2,000 Units total) by mouth daily. 90 capsule 11  . clobetasol cream (TEMOVATE) 1.61 % Apply 1 application topically 2 (two) times daily as  needed. 30 g 2  . conjugated estrogens (PREMARIN) vaginal cream Place 1 Applicatorful vaginally daily. 42.5 g 12  . cyclobenzaprine (FLEXERIL) 10 MG tablet Take 1 tablet (10 mg total) by mouth 2 (two) times daily as needed for muscle spasms. 20 tablet 0  . docusate sodium (COLACE) 100 MG capsule Take 1 capsule (100 mg total) by mouth 2 (two) times daily. 180 capsule 1  . fluticasone (FLONASE) 50 MCG/ACT nasal spray Place 2 sprays into both nostrils daily. 16 g 6  . gabapentin (NEURONTIN) 300 MG capsule Take 3 capsules (900 mg total) by mouth 3 (three) times daily. 270 capsule 3  .  hydrOXYzine (ATARAX/VISTARIL) 50 MG tablet Take 0.5 tablets (25 mg total) by mouth at bedtime as needed. 60 tablet 2  . Melatonin 10 MG TBCR Take 10 mg by mouth at bedtime. 90 tablet 2  . valACYclovir (VALTREX) 1000 MG tablet Take 2 tablets (2,000 mg total) by mouth 2 (two) times daily. 4 tablet 0  . venlafaxine XR (EFFEXOR XR) 37.5 MG 24 hr capsule Take 1 capsule (37.5 mg total) by mouth daily with breakfast. 30 capsule 11  . vitamin C (ASCORBIC ACID) 500 MG tablet Take 1 tablet (500 mg total) by mouth daily. 50 tablet 0   No facility-administered medications prior to visit.    Review of Systems  Constitutional: Negative for chills, fever, malaise/fatigue and weight loss.  HENT: Negative for hearing loss, sore throat and tinnitus.   Eyes: Negative for blurred vision and double vision.  Respiratory: Positive for cough. Negative for hemoptysis, sputum production, shortness of breath, wheezing and stridor.   Cardiovascular: Negative for chest pain, palpitations, orthopnea, leg swelling and PND.  Gastrointestinal: Negative for abdominal pain, constipation, diarrhea, heartburn, nausea and vomiting.  Genitourinary: Negative for dysuria, hematuria and urgency.  Musculoskeletal: Negative for joint pain and myalgias.  Skin: Negative for itching and rash.  Neurological: Negative for dizziness, tingling, weakness and  headaches.  Endo/Heme/Allergies: Negative for environmental allergies. Does not bruise/bleed easily.  Psychiatric/Behavioral: Negative for depression. The patient is not nervous/anxious and does not have insomnia.   All other systems reviewed and are negative.    Objective:  Physical Exam Vitals reviewed.  Constitutional:      General: She is not in acute distress.    Appearance: She is well-developed.  HENT:     Head: Normocephalic and atraumatic.  Eyes:     General: No scleral icterus.    Conjunctiva/sclera: Conjunctivae normal.     Pupils: Pupils are equal, round, and reactive to light.  Neck:     Vascular: No JVD.     Trachea: No tracheal deviation.  Cardiovascular:     Rate and Rhythm: Normal rate and regular rhythm.     Heart sounds: Normal heart sounds. No murmur heard.   Pulmonary:     Effort: Pulmonary effort is normal. No tachypnea, accessory muscle usage or respiratory distress.     Breath sounds: Normal breath sounds. No stridor. No wheezing, rhonchi or rales.  Abdominal:     General: Bowel sounds are normal. There is no distension.     Palpations: Abdomen is soft.     Tenderness: There is no abdominal tenderness.  Musculoskeletal:        General: No tenderness.     Cervical back: Neck supple.  Lymphadenopathy:     Cervical: No cervical adenopathy.  Skin:    General: Skin is warm and dry.     Capillary Refill: Capillary refill takes less than 2 seconds.     Findings: No rash.  Neurological:     Mental Status: She is alert and oriented to person, place, and time.  Psychiatric:        Behavior: Behavior normal.      Vitals:   07/15/20 1544  BP: 126/72  Pulse: 78  Temp: (!) 97 F (36.1 C)  TempSrc: Oral  SpO2: 93%  Weight: 173 lb (78.5 kg)  Height: 5\' 7"  (1.702 m)   93% on RA BMI Readings from Last 3 Encounters:  07/15/20 27.10 kg/m  06/23/20 26.52 kg/m  02/19/20 27.10 kg/m   Wt Readings from Last 3 Encounters:  07/15/20 173 lb (78.5 kg)   06/23/20 174 lb 6.4 oz (79.1 kg)  02/19/20 178 lb 3.2 oz (80.8 kg)     CBC    Component Value Date/Time   WBC 10.7 06/23/2020 1237   WBC 9.7 11/02/2015 1023   RBC 5.08 06/23/2020 1237   RBC 4.77 11/02/2015 1023   HGB 14.1 06/23/2020 1237   HCT 43.6 06/23/2020 1237   PLT 366 06/23/2020 1237   MCV 86 06/23/2020 1237   MCH 27.8 06/23/2020 1237   MCH 27.5 11/02/2015 1023   MCHC 32.3 06/23/2020 1237   MCHC 31.5 11/02/2015 1023   RDW 13.2 06/23/2020 1237   LYMPHSABS 2.2 06/23/2020 1237   MONOABS 0.6 07/19/2013 1430   EOSABS 0.2 06/23/2020 1237   BASOSABS 0.1 06/23/2020 1237     Chest Imaging:  Lung cancer screening CT October 2021: Bilateral cystic appearing masses concerning for multifocal adenocarcinoma. The patient's images have been independently reviewed by me.    Pulmonary Functions Testing Results: No flowsheet data found.  FeNO:   Pathology:   Echocardiogram:   Heart Catheterization:     Assessment & Plan:     ICD-10-CM   1. Mass of lower lobe of right lung  R91.8 CT Super D Chest Wo Contrast    Ambulatory referral to Pulmonology    NM PET Image Initial (PI) Skull Base To Thigh  2. Mass of lower lobe of left lung  R91.8 CT Super D Chest Wo Contrast    Ambulatory referral to Pulmonology    NM PET Image Initial (PI) Skull Base To Thigh  3. Malignant neoplasm of unspecified part of unspecified bronchus or lung ([place])  C34.90 NM PET Image Initial (PI) Skull Base To Thigh  4. Current smoker  F17.200   5. Centrilobular emphysema ([place])  J43.2   6. Chronic obstructive pulmonary disease, unspecified COPD type ([place])  J44.9     Discussion: This is a 64 year old female with longstanding history of tobacco abuse.  She is a current smoker for 40+ years, 40+ pack year history.  She had a noncontrasted CT of the chest for lung cancer screening which revealed bilateral lower lobe lesions cystic in nature peripheral in size concerning for low-grade multifocal  adenocarcinoma of the lung.  Plan: Today in the office we discussed the risk benefits and alternatives of proceeding with tissue diagnosis. We discussed percutaneous biopsies versus video bronchoscopy with navigation. Patient is agreeable to proceed with video bronchoscopy and endobronchial navigation for tissue sampling of the bilateral lesions present on CT imaging. Today in the office we reviewed the CT imaging in detail.  The patient's husband was present as well as the patient's daughter was present via phone. All questions were answered. We discussed the risk of bleeding as well as pneumothorax.  Additional time was spent counseling patient on smoking cessation as documented below.  Smoking Cessation Counseling:   The patient's current tobacco use: current tobacco use, 1 pack/day, 41+ years. The patient was advised to quit and impact of smoking on their health.  I assessed the patient's willingness to attempt to quit. I provided methods and skills for cessation.  We discussed tapering method today in the office. We reviewed medication management of smoking session drugs if appropriate.  We discussed the utilization of Chantix. Resources to help quit smoking were provided. A smoking cessation quit date was set: Possibly December 1 Follow-up was arranged in our clinic.  The amount of time spent counseling patient was 4 mins separate of  documented time for office visit.  Patient return to clinic in 6 weeks.  If a positive tissue diagnosis for malignancy is made between now and then we will proceed with appropriate oncology referrals.   Current Outpatient Medications:  .  acetaminophen (TYLENOL) 650 MG CR tablet, Take 650 mg by mouth every 8 (eight) hours as needed for pain., Disp: , Rfl:  .  atorvastatin (LIPITOR) 40 MG tablet, Take 1 tablet (40 mg total) by mouth daily., Disp: 90 tablet, Rfl: 3 .  carbamide peroxide (DEBROX) 6.5 % OTIC solution, Place 5 drops into both ears 2 (two)  times daily., Disp: 15 mL, Rfl: 0 .  cetirizine (ZYRTEC) 10 MG tablet, Take 1 tablet (10 mg total) by mouth daily., Disp: 30 tablet, Rfl: 6 .  Cholecalciferol (VITAMIN D) 2000 units CAPS, Take 1 capsule (2,000 Units total) by mouth daily., Disp: 90 capsule, Rfl: 11 .  clobetasol cream (TEMOVATE) 2.11 %, Apply 1 application topically 2 (two) times daily as needed., Disp: 30 g, Rfl: 2 .  conjugated estrogens (PREMARIN) vaginal cream, Place 1 Applicatorful vaginally daily., Disp: 42.5 g, Rfl: 12 .  cyclobenzaprine (FLEXERIL) 10 MG tablet, Take 1 tablet (10 mg total) by mouth 2 (two) times daily as needed for muscle spasms., Disp: 20 tablet, Rfl: 0 .  docusate sodium (COLACE) 100 MG capsule, Take 1 capsule (100 mg total) by mouth 2 (two) times daily., Disp: 180 capsule, Rfl: 1 .  fluticasone (FLONASE) 50 MCG/ACT nasal spray, Place 2 sprays into both nostrils daily., Disp: 16 g, Rfl: 6 .  gabapentin (NEURONTIN) 300 MG capsule, Take 3 capsules (900 mg total) by mouth 3 (three) times daily., Disp: 270 capsule, Rfl: 3 .  hydrOXYzine (ATARAX/VISTARIL) 50 MG tablet, Take 0.5 tablets (25 mg total) by mouth at bedtime as needed., Disp: 60 tablet, Rfl: 2 .  Melatonin 10 MG TBCR, Take 10 mg by mouth at bedtime., Disp: 90 tablet, Rfl: 2 .  valACYclovir (VALTREX) 1000 MG tablet, Take 2 tablets (2,000 mg total) by mouth 2 (two) times daily., Disp: 4 tablet, Rfl: 0 .  venlafaxine XR (EFFEXOR XR) 37.5 MG 24 hr capsule, Take 1 capsule (37.5 mg total) by mouth daily with breakfast., Disp: 30 capsule, Rfl: 11 .  vitamin C (ASCORBIC ACID) 500 MG tablet, Take 1 tablet (500 mg total) by mouth daily., Disp: 50 tablet, Rfl: 0  I spent 60 minutes dedicated to the care of this patient on the date of this encounter to include pre-visit review of records, face-to-face time with the patient discussing conditions above, post visit ordering of testing, clinical documentation with the electronic health record, making appropriate  referrals as documented, and communicating necessary findings to members of the patients care team.   [name], DO [facility] 07/15/2020 3:47 PM

## 2020-07-15 NOTE — Progress Notes (Signed)
Synopsis: Referred in November 2021 for abnormal CT chest .  By [name], MD  Subjective:   PATIENT ID: Kennon Rounds GENDER: female DOB: 30-Nov-1955, MRN: [id number]  Chief Complaint  Patient presents with  . Consult    pt here to discuss lung nodules    this is a 64 year old female, past medical history of depression, anxiety, current tobacco abuse.  She initially seen by her primary care provider which encouraged her to have a lung cancer screening CT.  She had this completed in October 2021.  The CT scan revealed bilateral lower lobe cystic peripheral based lesions both greater than 4 cm in size concerning for multifocal adenocarcinoma.  Patient has no respiratory symptoms at this time.  She is a current smoker.  She has smoked for 41 years at approximately 1 pack/day.  She presents today in the office with her husband and her daughter via phone.  Long discussion today regarding CT imaging as well as next appropriate steps.  Patient denies fevers chills night sweats weight loss or hemoptysis.   Past Medical History:  Diagnosis Date  . Allergy    seasonal  . Anxiety 2009  . Arthritis 2000  . Depression   . Depression 07/19/2013  . Fibroid 2003  . Organ donation 11/29/2019  . Wears glasses      Family History  Problem Relation Age of Onset  . Hypertension Mother   . Hyperlipidemia Mother   . Early death Sister 35       Drug overdose  . Dementia Father   . Cancer Father        Throat cancer   . Parkinson's disease Father   . Hypertension Daughter   . Diabetes Daughter   . Cancer Brother        Throat     Past Surgical History:  Procedure Laterality Date  . ARTHROTOMY Right 11/09/2015   Procedure: RIGHT WRIST DISTAL RADIAL ULNAR JOINT ARTHROTOMY AND DEBRIDEMENT,  AND ;  Surgeon: Iran Planas, MD;  Location: [facility];  Service: Orthopedics;  Laterality: Right;  . COLONOSCOPY    . HARDWARE REMOVAL Right 07/24/2013   Procedure: RIGHT WRIST HARDWARE REMOVAL, JOINT  RELEASE, RIGHT HAND MANIPULATION UNDER ANESTHESIA;  Surgeon: Linna Hoff, MD;  Location: [place];  Service: Orthopedics;  Laterality: Right;  . NECK SURGERY  2003   Cervical vertebroplasty   . OPEN REDUCTION INTERNAL FIXATION (ORIF) DISTAL RADIAL FRACTURE Right 11/08/2012   Procedure: OPEN REDUCTION INTERNAL FIXATION (ORIF) RIGHT DISTAL RADIUS FRACTURE;  Surgeon: Linna Hoff, MD;  Location: [facility];  Service: Orthopedics;  Laterality: Right;  . WRIST ARTHROPLASTY Right 11/09/2015   Procedure: OR DISTAL ULNAR RESECTION ARTHROPLASTY ;  Surgeon: Iran Planas, MD;  Location: [place];  Service: Orthopedics;  Laterality: Right;  . WRIST ARTHROSCOPY WITH DEBRIDEMENT Right 07/21/2014   Procedure: RIGHT WRIST DISTAL RADIAL ULNAR JOINT DEBRIDEMENT AND JOINT RELEASE POSSIBLE TENDON INTERPOSITION;  Surgeon: Linna Hoff, MD;  Location: [facility];  Service: Orthopedics;  Laterality: Right;    Social History   Socioeconomic History  . Marital status: Married    Spouse name: Myrtice Lowdermilk  . Number of children: 2  . Years of education: 27  . Highest education level: Not on file  Occupational History  . Occupation: Unemployed     Employer:  [employer]    Comment: Sweep   Tobacco Use  . Smoking status: Current Every Day Smoker    Packs/day: 1.00  Years: 41.00    Pack years: 41.00    Types: Cigarettes    Start date: 04/13/1975  . Smokeless tobacco: Never Used  Vaping Use  . Vaping Use: Never used  Substance and Sexual Activity  . Alcohol use: No    Comment: Holidays only  . Drug use: No  . Sexual activity: Not Currently    Partners: Male    Birth control/protection: None, Post-menopausal  Other Topics Concern  . Not on file  Social History Narrative   Patient lives alone currently in [place].    Patient is married, they are living separately at this time.    Patient enjoys watching tv, cooking, and spending time with her children and grandchildren.     Social Determinants of Health   Financial Resource Strain: Low Risk   . Difficulty of Paying Living Expenses: Not hard at all  Food Insecurity: No Food Insecurity  . Worried About Charity fundraiser in the Last Year: Never true  . Ran Out of Food in the Last Year: Never true  Transportation Needs: No Transportation Needs  . Lack of Transportation (Medical): No  . Lack of Transportation (Non-Medical): No  Physical Activity: Inactive  . Days of Exercise per Week: 0 days  . Minutes of Exercise per Session: 0 min  Stress: No Stress Concern Present  . Feeling of Stress : Not at all  Social Connections: Moderately Isolated  . Frequency of Communication with Friends and Family: More than three times a week  . Frequency of Social Gatherings with Friends and Family: More than three times a week  . Attends Religious Services: Never  . Active Member of Clubs or Organizations: No  . Attends Archivist Meetings: Never  . Marital Status: Married  Human resources officer Violence: Not At Risk  . Fear of Current or Ex-Partner: No  . Emotionally Abused: No  . Physically Abused: No  . Sexually Abused: No     Allergies  Allergen Reactions  . Codeine Nausea And Vomiting and Other (See Comments)    Hallucinations     Outpatient Medications Prior to Visit  Medication Sig Dispense Refill  . acetaminophen (TYLENOL) 650 MG CR tablet Take 650 mg by mouth every 8 (eight) hours as needed for pain.    Marland Kitchen atorvastatin (LIPITOR) 40 MG tablet Take 1 tablet (40 mg total) by mouth daily. 90 tablet 3  . carbamide peroxide (DEBROX) 6.5 % OTIC solution Place 5 drops into both ears 2 (two) times daily. 15 mL 0  . cetirizine (ZYRTEC) 10 MG tablet Take 1 tablet (10 mg total) by mouth daily. 30 tablet 6  . Cholecalciferol (VITAMIN D) 2000 units CAPS Take 1 capsule (2,000 Units total) by mouth daily. 90 capsule 11  . clobetasol cream (TEMOVATE) 8.92 % Apply 1 application topically 2 (two) times daily as  needed. 30 g 2  . conjugated estrogens (PREMARIN) vaginal cream Place 1 Applicatorful vaginally daily. 42.5 g 12  . cyclobenzaprine (FLEXERIL) 10 MG tablet Take 1 tablet (10 mg total) by mouth 2 (two) times daily as needed for muscle spasms. 20 tablet 0  . docusate sodium (COLACE) 100 MG capsule Take 1 capsule (100 mg total) by mouth 2 (two) times daily. 180 capsule 1  . fluticasone (FLONASE) 50 MCG/ACT nasal spray Place 2 sprays into both nostrils daily. 16 g 6  . gabapentin (NEURONTIN) 300 MG capsule Take 3 capsules (900 mg total) by mouth 3 (three) times daily. 270 capsule 3  .  hydrOXYzine (ATARAX/VISTARIL) 50 MG tablet Take 0.5 tablets (25 mg total) by mouth at bedtime as needed. 60 tablet 2  . Melatonin 10 MG TBCR Take 10 mg by mouth at bedtime. 90 tablet 2  . valACYclovir (VALTREX) 1000 MG tablet Take 2 tablets (2,000 mg total) by mouth 2 (two) times daily. 4 tablet 0  . venlafaxine XR (EFFEXOR XR) 37.5 MG 24 hr capsule Take 1 capsule (37.5 mg total) by mouth daily with breakfast. 30 capsule 11  . vitamin C (ASCORBIC ACID) 500 MG tablet Take 1 tablet (500 mg total) by mouth daily. 50 tablet 0   No facility-administered medications prior to visit.    Review of Systems  Constitutional: Negative for chills, fever, malaise/fatigue and weight loss.  HENT: Negative for hearing loss, sore throat and tinnitus.   Eyes: Negative for blurred vision and double vision.  Respiratory: Positive for cough. Negative for hemoptysis, sputum production, shortness of breath, wheezing and stridor.   Cardiovascular: Negative for chest pain, palpitations, orthopnea, leg swelling and PND.  Gastrointestinal: Negative for abdominal pain, constipation, diarrhea, heartburn, nausea and vomiting.  Genitourinary: Negative for dysuria, hematuria and urgency.  Musculoskeletal: Negative for joint pain and myalgias.  Skin: Negative for itching and rash.  Neurological: Negative for dizziness, tingling, weakness and  headaches.  Endo/Heme/Allergies: Negative for environmental allergies. Does not bruise/bleed easily.  Psychiatric/Behavioral: Negative for depression. The patient is not nervous/anxious and does not have insomnia.   All other systems reviewed and are negative.    Objective:  Physical Exam Vitals reviewed.  Constitutional:      General: She is not in acute distress.    Appearance: She is well-developed.  HENT:     Head: Normocephalic and atraumatic.  Eyes:     General: No scleral icterus.    Conjunctiva/sclera: Conjunctivae normal.     Pupils: Pupils are equal, round, and reactive to light.  Neck:     Vascular: No JVD.     Trachea: No tracheal deviation.  Cardiovascular:     Rate and Rhythm: Normal rate and regular rhythm.     Heart sounds: Normal heart sounds. No murmur heard.   Pulmonary:     Effort: Pulmonary effort is normal. No tachypnea, accessory muscle usage or respiratory distress.     Breath sounds: Normal breath sounds. No stridor. No wheezing, rhonchi or rales.  Abdominal:     General: Bowel sounds are normal. There is no distension.     Palpations: Abdomen is soft.     Tenderness: There is no abdominal tenderness.  Musculoskeletal:        General: No tenderness.     Cervical back: Neck supple.  Lymphadenopathy:     Cervical: No cervical adenopathy.  Skin:    General: Skin is warm and dry.     Capillary Refill: Capillary refill takes less than 2 seconds.     Findings: No rash.  Neurological:     Mental Status: She is alert and oriented to person, place, and time.  Psychiatric:        Behavior: Behavior normal.      Vitals:   07/15/20 1544  BP: 126/72  Pulse: 78  Temp: (!) 97 F (36.1 C)  TempSrc: Oral  SpO2: 93%  Weight: 173 lb (78.5 kg)  Height: 5\' 7"  (1.702 m)   93% on RA BMI Readings from Last 3 Encounters:  07/15/20 27.10 kg/m  06/23/20 26.52 kg/m  02/19/20 27.10 kg/m   Wt Readings from Last 3 Encounters:  07/15/20 173 lb (78.5 kg)   06/23/20 174 lb 6.4 oz (79.1 kg)  02/19/20 178 lb 3.2 oz (80.8 kg)     CBC    Component Value Date/Time   WBC 10.7 06/23/2020 1237   WBC 9.7 11/02/2015 1023   RBC 5.08 06/23/2020 1237   RBC 4.77 11/02/2015 1023   HGB 14.1 06/23/2020 1237   HCT 43.6 06/23/2020 1237   PLT 366 06/23/2020 1237   MCV 86 06/23/2020 1237   MCH 27.8 06/23/2020 1237   MCH 27.5 11/02/2015 1023   MCHC 32.3 06/23/2020 1237   MCHC 31.5 11/02/2015 1023   RDW 13.2 06/23/2020 1237   LYMPHSABS 2.2 06/23/2020 1237   MONOABS 0.6 07/19/2013 1430   EOSABS 0.2 06/23/2020 1237   BASOSABS 0.1 06/23/2020 1237     Chest Imaging:  Lung cancer screening CT October 2021: Bilateral cystic appearing masses concerning for multifocal adenocarcinoma. The patient's images have been independently reviewed by me.    Pulmonary Functions Testing Results: No flowsheet data found.  FeNO:   Pathology:   Echocardiogram:   Heart Catheterization:     Assessment & Plan:     ICD-10-CM   1. Mass of lower lobe of right lung  R91.8 CT Super D Chest Wo Contrast    Ambulatory referral to Pulmonology    NM PET Image Initial (PI) Skull Base To Thigh  2. Mass of lower lobe of left lung  R91.8 CT Super D Chest Wo Contrast    Ambulatory referral to Pulmonology    NM PET Image Initial (PI) Skull Base To Thigh  3. Malignant neoplasm of unspecified part of unspecified bronchus or lung ([place])  C34.90 NM PET Image Initial (PI) Skull Base To Thigh  4. Current smoker  F17.200   5. Centrilobular emphysema ([place])  J43.2   6. Chronic obstructive pulmonary disease, unspecified COPD type ([place])  J44.9     Discussion: This is a 64 year old female with longstanding history of tobacco abuse.  She is a current smoker for 40+ years, 40+ pack year history.  She had a noncontrasted CT of the chest for lung cancer screening which revealed bilateral lower lobe lesions cystic in nature peripheral in size concerning for low-grade multifocal  adenocarcinoma of the lung.  Plan: Today in the office we discussed the risk benefits and alternatives of proceeding with tissue diagnosis. We discussed percutaneous biopsies versus video bronchoscopy with navigation. Patient is agreeable to proceed with video bronchoscopy and endobronchial navigation for tissue sampling of the bilateral lesions present on CT imaging. Today in the office we reviewed the CT imaging in detail.  The patient's husband was present as well as the patient's daughter was present via phone. All questions were answered. We discussed the risk of bleeding as well as pneumothorax.  Additional time was spent counseling patient on smoking cessation as documented below.  Smoking Cessation Counseling:   The patient's current tobacco use: current tobacco use, 1 pack/day, 41+ years. The patient was advised to quit and impact of smoking on their health.  I assessed the patient's willingness to attempt to quit. I provided methods and skills for cessation.  We discussed tapering method today in the office. We reviewed medication management of smoking session drugs if appropriate.  We discussed the utilization of Chantix. Resources to help quit smoking were provided. A smoking cessation quit date was set: Possibly December 1 Follow-up was arranged in our clinic.  The amount of time spent counseling patient was 4 mins separate of  documented time for office visit.  Patient return to clinic in 6 weeks.  If a positive tissue diagnosis for malignancy is made between now and then we will proceed with appropriate oncology referrals.   Current Outpatient Medications:  .  acetaminophen (TYLENOL) 650 MG CR tablet, Take 650 mg by mouth every 8 (eight) hours as needed for pain., Disp: , Rfl:  .  atorvastatin (LIPITOR) 40 MG tablet, Take 1 tablet (40 mg total) by mouth daily., Disp: 90 tablet, Rfl: 3 .  carbamide peroxide (DEBROX) 6.5 % OTIC solution, Place 5 drops into both ears 2 (two)  times daily., Disp: 15 mL, Rfl: 0 .  cetirizine (ZYRTEC) 10 MG tablet, Take 1 tablet (10 mg total) by mouth daily., Disp: 30 tablet, Rfl: 6 .  Cholecalciferol (VITAMIN D) 2000 units CAPS, Take 1 capsule (2,000 Units total) by mouth daily., Disp: 90 capsule, Rfl: 11 .  clobetasol cream (TEMOVATE) 1.61 %, Apply 1 application topically 2 (two) times daily as needed., Disp: 30 g, Rfl: 2 .  conjugated estrogens (PREMARIN) vaginal cream, Place 1 Applicatorful vaginally daily., Disp: 42.5 g, Rfl: 12 .  cyclobenzaprine (FLEXERIL) 10 MG tablet, Take 1 tablet (10 mg total) by mouth 2 (two) times daily as needed for muscle spasms., Disp: 20 tablet, Rfl: 0 .  docusate sodium (COLACE) 100 MG capsule, Take 1 capsule (100 mg total) by mouth 2 (two) times daily., Disp: 180 capsule, Rfl: 1 .  fluticasone (FLONASE) 50 MCG/ACT nasal spray, Place 2 sprays into both nostrils daily., Disp: 16 g, Rfl: 6 .  gabapentin (NEURONTIN) 300 MG capsule, Take 3 capsules (900 mg total) by mouth 3 (three) times daily., Disp: 270 capsule, Rfl: 3 .  hydrOXYzine (ATARAX/VISTARIL) 50 MG tablet, Take 0.5 tablets (25 mg total) by mouth at bedtime as needed., Disp: 60 tablet, Rfl: 2 .  Melatonin 10 MG TBCR, Take 10 mg by mouth at bedtime., Disp: 90 tablet, Rfl: 2 .  valACYclovir (VALTREX) 1000 MG tablet, Take 2 tablets (2,000 mg total) by mouth 2 (two) times daily., Disp: 4 tablet, Rfl: 0 .  venlafaxine XR (EFFEXOR XR) 37.5 MG 24 hr capsule, Take 1 capsule (37.5 mg total) by mouth daily with breakfast., Disp: 30 capsule, Rfl: 11 .  vitamin C (ASCORBIC ACID) 500 MG tablet, Take 1 tablet (500 mg total) by mouth daily., Disp: 50 tablet, Rfl: 0  I spent 60 minutes dedicated to the care of this patient on the date of this encounter to include pre-visit review of records, face-to-face time with the patient discussing conditions above, post visit ordering of testing, clinical documentation with the electronic health record, making appropriate  referrals as documented, and communicating necessary findings to members of the patients care team.   [name], DO [facility] 07/15/2020 3:47 PM

## 2020-07-15 NOTE — Patient Instructions (Addendum)
Thank you for visiting [name] at [facility]. Today we recommend the following:  Orders Placed This Encounter  Procedures  . CT Super D Chest Wo Contrast  . NM PET Image Initial (PI) Skull Base To Thigh  . Ambulatory referral to Pulmonology   Planned bronchoscopy on 16th November  Return in about 6 weeks (around 08/26/2020) for [name] .    Please do your part to reduce the spread of COVID-19.   You must quit smoking or vaping. This is the single most important thing that you can do to improve your lung health.   S = Set a quit date. T = Tell family, friends, and the people around you that you plan to quit. A = Anticipate or plan ahead for the tough times you'll face while quitting. R = Remove cigarettes and other tobacco products from your home, car, and work T = Talk to [place] about getting help to quit  If you need help feel free to reach out to our office, [place] Smoking Cessation Class: 2065113940, call 1-800-QUIT-NOW, or visit [email or web address]

## 2020-07-17 ENCOUNTER — Telehealth: Payer: Self-pay | Admitting: Pulmonary Disease

## 2020-07-17 NOTE — Telephone Encounter (Signed)
Pt has been scheduled & notified of the following:  SUPER D CT - 11/12 @ 0730 @ [facility] COVID TEST 11/13 @ 1210 ENB - 11/16 @ 1315, check in by 10:45 PET - 11/18 @ 1000 @ [facility]

## 2020-07-24 ENCOUNTER — Ambulatory Visit (HOSPITAL_COMMUNITY)
Admission: RE | Admit: 2020-07-24 | Discharge: 2020-07-24 | Disposition: A | Discharge status: Home / Self Care | Payer: Medicare Other | Source: Ambulatory Visit | Attending: Pulmonary Disease | Admitting: Pulmonary Disease

## 2020-07-24 ENCOUNTER — Other Ambulatory Visit: Payer: Self-pay

## 2020-07-24 DIAGNOSIS — Q278 Other specified congenital malformations of peripheral vascular system: Secondary | ICD-10-CM | POA: Diagnosis not present

## 2020-07-24 DIAGNOSIS — J984 Other disorders of lung: Secondary | ICD-10-CM | POA: Diagnosis not present

## 2020-07-24 DIAGNOSIS — I700 Atherosclerosis of aorta: Secondary | ICD-10-CM | POA: Diagnosis not present

## 2020-07-24 DIAGNOSIS — R918 Other nonspecific abnormal finding of lung field: Secondary | ICD-10-CM | POA: Insufficient documentation

## 2020-07-24 DIAGNOSIS — J432 Centrilobular emphysema: Secondary | ICD-10-CM | POA: Diagnosis not present

## 2020-07-25 ENCOUNTER — Other Ambulatory Visit (HOSPITAL_COMMUNITY): Payer: Medicare Other

## 2020-07-27 ENCOUNTER — Other Ambulatory Visit (HOSPITAL_COMMUNITY)
Admission: RE | Admit: 2020-07-27 | Discharge: 2020-07-27 | Disposition: A | Discharge status: Home / Self Care | Payer: Medicare Other | Source: Ambulatory Visit | Attending: Pulmonary Disease | Admitting: Pulmonary Disease

## 2020-07-27 ENCOUNTER — Encounter (HOSPITAL_COMMUNITY): Payer: Self-pay | Admitting: Pulmonary Disease

## 2020-07-27 DIAGNOSIS — Z20822 Contact with and (suspected) exposure to covid-19: Secondary | ICD-10-CM | POA: Insufficient documentation

## 2020-07-27 DIAGNOSIS — Z01812 Encounter for preprocedural laboratory examination: Secondary | ICD-10-CM | POA: Diagnosis not present

## 2020-07-27 LAB — SARS CORONAVIRUS 2 (TAT 6-24 HRS): SARS Coronavirus 2: NEGATIVE

## 2020-07-27 NOTE — Progress Notes (Signed)
PCP:  Dr. Dorris Singh Cardiologist:  Denies  EKG:  NA CXR:  N/A ECHO: Denies Stress Test:  Denies Cardiac Cath:  Denies  Fasting Blood Sugar-   N/A Checks Blood Sugar_N/A__ times a day  ASA/Blood Thinners:  No  OSA/CPAP:  No  Covid test 07/27/20  Anesthesia Review:  No  Patient denies shortness of breath, fever, cough, and chest pain at PAT appointment.  Patient verbalized understanding of instructions provided today at the PAT appointment.  Patient asked to review instructions at home and day of surgery.

## 2020-07-28 ENCOUNTER — Ambulatory Visit (HOSPITAL_COMMUNITY): Payer: Medicare Other

## 2020-07-28 ENCOUNTER — Ambulatory Visit (HOSPITAL_COMMUNITY): Payer: Medicare Other | Admitting: Certified Registered Nurse Anesthetist

## 2020-07-28 ENCOUNTER — Encounter (HOSPITAL_COMMUNITY): Payer: Self-pay | Admitting: Pulmonary Disease

## 2020-07-28 ENCOUNTER — Ambulatory Visit (HOSPITAL_COMMUNITY)
Admission: RE | Admit: 2020-07-28 | Discharge: 2020-07-28 | Disposition: A | Discharge status: Home / Self Care | Payer: Medicare Other | Attending: Pulmonary Disease | Admitting: Pulmonary Disease

## 2020-07-28 ENCOUNTER — Encounter (HOSPITAL_COMMUNITY)
Admission: RE | Disposition: A | Discharge status: Home / Self Care | Payer: Self-pay | Source: Home / Self Care | Attending: Pulmonary Disease

## 2020-07-28 ENCOUNTER — Other Ambulatory Visit: Payer: Self-pay

## 2020-07-28 DIAGNOSIS — F17210 Nicotine dependence, cigarettes, uncomplicated: Secondary | ICD-10-CM | POA: Insufficient documentation

## 2020-07-28 DIAGNOSIS — R911 Solitary pulmonary nodule: Secondary | ICD-10-CM | POA: Diagnosis present

## 2020-07-28 DIAGNOSIS — Z885 Allergy status to narcotic agent status: Secondary | ICD-10-CM | POA: Diagnosis not present

## 2020-07-28 DIAGNOSIS — R846 Abnormal cytological findings in specimens from respiratory organs and thorax: Secondary | ICD-10-CM | POA: Insufficient documentation

## 2020-07-28 DIAGNOSIS — Z79899 Other long term (current) drug therapy: Secondary | ICD-10-CM | POA: Diagnosis not present

## 2020-07-28 DIAGNOSIS — J432 Centrilobular emphysema: Secondary | ICD-10-CM | POA: Diagnosis not present

## 2020-07-28 DIAGNOSIS — R918 Other nonspecific abnormal finding of lung field: Secondary | ICD-10-CM | POA: Insufficient documentation

## 2020-07-28 DIAGNOSIS — Z801 Family history of malignant neoplasm of trachea, bronchus and lung: Secondary | ICD-10-CM | POA: Diagnosis not present

## 2020-07-28 DIAGNOSIS — Z9889 Other specified postprocedural states: Secondary | ICD-10-CM

## 2020-07-28 DIAGNOSIS — C3432 Malignant neoplasm of lower lobe, left bronchus or lung: Secondary | ICD-10-CM | POA: Diagnosis not present

## 2020-07-28 HISTORY — PX: VIDEO BRONCHOSCOPY WITH ENDOBRONCHIAL NAVIGATION: SHX6175

## 2020-07-28 HISTORY — PX: BRONCHIAL WASHINGS: SHX5105

## 2020-07-28 HISTORY — PX: BRONCHIAL BIOPSY: SHX5109

## 2020-07-28 HISTORY — PX: BRONCHIAL NEEDLE ASPIRATION BIOPSY: SHX5106

## 2020-07-28 HISTORY — PX: BRONCHIAL BRUSHINGS: SHX5108

## 2020-07-28 LAB — CBC
HCT: 44.3 % (ref 36.0–46.0)
Hemoglobin: 13.7 g/dL (ref 12.0–15.0)
MCH: 27.2 pg (ref 26.0–34.0)
MCHC: 30.9 g/dL (ref 30.0–36.0)
MCV: 88.1 fL (ref 80.0–100.0)
Platelets: 365 10*3/uL (ref 150–400)
RBC: 5.03 MIL/uL (ref 3.87–5.11)
RDW: 14.1 % (ref 11.5–15.5)
WBC: 8.6 10*3/uL (ref 4.0–10.5)
nRBC: 0 % (ref 0.0–0.2)

## 2020-07-28 LAB — BASIC METABOLIC PANEL
Anion gap: 12 (ref 5–15)
BUN: 14 mg/dL (ref 8–23)
CO2: 22 mmol/L (ref 22–32)
Calcium: 9.2 mg/dL (ref 8.9–10.3)
Chloride: 108 mmol/L (ref 98–111)
Creatinine, Ser: 0.98 mg/dL (ref 0.44–1.00)
GFR, Estimated: >60 mL/min (ref >60)
Glucose, Bld: 89 mg/dL (ref 70–99)
Potassium: 3.9 mmol/L (ref 3.5–5.1)
Sodium: 142 mmol/L (ref 135–145)

## 2020-07-28 SURGERY — VIDEO BRONCHOSCOPY WITH ENDOBRONCHIAL NAVIGATION
Anesthesia: General

## 2020-07-28 MED ORDER — FENTANYL CITRATE (PF) 250 MCG/5ML IJ SOLN
INTRAMUSCULAR | Status: DC | PRN
  Administered 2020-07-28 (×2): 50 ug via INTRAVENOUS

## 2020-07-28 MED ORDER — SUGAMMADEX SODIUM 200 MG/2ML IV SOLN
INTRAVENOUS | Status: DC | PRN
  Administered 2020-07-28: 200 mg via INTRAVENOUS

## 2020-07-28 MED ORDER — PROPOFOL 10 MG/ML IV BOLUS
INTRAVENOUS | Status: DC | PRN
  Administered 2020-07-28: 120 mg via INTRAVENOUS

## 2020-07-28 MED ORDER — DEXAMETHASONE SODIUM PHOSPHATE 10 MG/ML IJ SOLN
INTRAMUSCULAR | Status: DC | PRN
  Administered 2020-07-28: 5 mg via INTRAVENOUS

## 2020-07-28 MED ORDER — LACTATED RINGERS IV SOLN: INTRAVENOUS | Status: DC

## 2020-07-28 MED ORDER — ACETAMINOPHEN 325 MG PO TABS
650.0000 mg | ORAL_TABLET | Freq: Once | ORAL | Status: AC
  Administered 2020-07-28: 650 mg via ORAL
  Filled 2020-07-28 (×2): qty 2

## 2020-07-28 MED ORDER — PHENYLEPHRINE HCL-NACL 10-0.9 MG/250ML-% IV SOLN
INTRAVENOUS | Status: DC | PRN
  Administered 2020-07-28: 25 ug/min via INTRAVENOUS

## 2020-07-28 MED ORDER — LIDOCAINE 2% (20 MG/ML) 5 ML SYRINGE
INTRAMUSCULAR | Status: DC | PRN
  Administered 2020-07-28: 100 mg via INTRAVENOUS

## 2020-07-28 MED ORDER — MIDAZOLAM HCL 2 MG/2ML IJ SOLN
INTRAMUSCULAR | Status: DC | PRN
  Administered 2020-07-28: 2 mg via INTRAVENOUS

## 2020-07-28 MED ORDER — CHLORHEXIDINE GLUCONATE 0.12 % MT SOLN
15.0000 mL | Freq: Once | OROMUCOSAL | Status: AC
  Administered 2020-07-28: 15 mL via OROMUCOSAL
  Filled 2020-07-28 (×2): qty 15

## 2020-07-28 MED ORDER — ONDANSETRON HCL 4 MG/2ML IJ SOLN
INTRAMUSCULAR | Status: DC | PRN
  Administered 2020-07-28: 4 mg via INTRAVENOUS

## 2020-07-28 MED ORDER — ROCURONIUM BROMIDE 10 MG/ML (PF) SYRINGE
PREFILLED_SYRINGE | INTRAVENOUS | Status: DC | PRN
  Administered 2020-07-28: 50 mg via INTRAVENOUS

## 2020-07-28 SURGICAL SUPPLY — 45 items
ADAPTER BRONCH F/PENTAX (ADAPTER) ×4 IMPLANT
ADAPTER VALVE BIOPSY EBUS (MISCELLANEOUS) IMPLANT
ADPTR VALVE BIOPSY EBUS (MISCELLANEOUS)
BRUSH CYTOL CELLEBRITY 1.5X140 (MISCELLANEOUS) ×4 IMPLANT
BRUSH SUPERTRAX BIOPSY (INSTRUMENTS) IMPLANT
BRUSH SUPERTRAX NDL-TIP CYTO (INSTRUMENTS) ×4 IMPLANT
CANISTER SUCT 3000ML PPV (MISCELLANEOUS) ×4 IMPLANT
CHANNEL WORK EXTEND EDGE 180 (KITS) IMPLANT
CHANNEL WORK EXTEND EDGE 45 (KITS) IMPLANT
CHANNEL WORK EXTEND EDGE 90 (KITS) IMPLANT
CONT SPEC 4OZ CLIKSEAL STRL BL (MISCELLANEOUS) ×4 IMPLANT
COVER BACK TABLE 60X90IN (DRAPES) ×4 IMPLANT
FILTER STRAW FLUID ASPIR (MISCELLANEOUS) IMPLANT
FORCEPS BIOP SUPERTRX PREMAR (INSTRUMENTS) ×4 IMPLANT
GAUZE SPONGE 4X4 12PLY STRL (GAUZE/BANDAGES/DRESSINGS) ×4 IMPLANT
GLOVE SURG SS PI 7.5 STRL IVOR (GLOVE) ×8 IMPLANT
GOWN STRL REUS W/ TWL LRG LVL3 (GOWN DISPOSABLE) ×4 IMPLANT
GOWN STRL REUS W/TWL LRG LVL3 (GOWN DISPOSABLE) ×8
KIT CLEAN ENDO COMPLIANCE (KITS) ×4 IMPLANT
KIT LOCATABLE GUIDE (CANNULA) IMPLANT
KIT MARKER FIDUCIAL DELIVERY (KITS) IMPLANT
KIT PROCEDURE EDGE 180 (KITS) IMPLANT
KIT PROCEDURE EDGE 45 (KITS) IMPLANT
KIT PROCEDURE EDGE 90 (KITS) IMPLANT
KIT TURNOVER KIT B (KITS) ×4 IMPLANT
MARKER SKIN DUAL TIP RULER LAB (MISCELLANEOUS) ×4 IMPLANT
NEEDLE SUPERTRX PREMARK BIOPSY (NEEDLE) ×4 IMPLANT
NS IRRIG 1000ML POUR BTL (IV SOLUTION) ×4 IMPLANT
OIL SILICONE PENTAX (PARTS (SERVICE/REPAIRS)) ×4 IMPLANT
PAD ARMBOARD 7.5X6 YLW CONV (MISCELLANEOUS) ×8 IMPLANT
PATCHES PATIENT (LABEL) ×12 IMPLANT
SOL ANTI FOG 6CC (MISCELLANEOUS) ×2 IMPLANT
SOLUTION ANTI FOG 6CC (MISCELLANEOUS) ×2
SYR 20CC LL (SYRINGE) ×4 IMPLANT
SYR 20ML ECCENTRIC (SYRINGE) ×4 IMPLANT
SYR 50ML SLIP (SYRINGE) ×4 IMPLANT
TOWEL OR 17X24 6PK STRL BLUE (TOWEL DISPOSABLE) ×4 IMPLANT
TRAP SPECIMEN MUCOUS 40CC (MISCELLANEOUS) IMPLANT
TUBE CONNECTING 20'X1/4 (TUBING) ×1
TUBE CONNECTING 20X1/4 (TUBING) ×3 IMPLANT
UNDERPAD 30X30 (UNDERPADS AND DIAPERS) ×4 IMPLANT
VALVE BIOPSY  SINGLE USE (MISCELLANEOUS) ×4
VALVE BIOPSY SINGLE USE (MISCELLANEOUS) ×2 IMPLANT
VALVE SUCTION BRONCHIO DISP (MISCELLANEOUS) ×4 IMPLANT
WATER STERILE IRR 1000ML POUR (IV SOLUTION) ×4 IMPLANT

## 2020-07-28 NOTE — Interval H&P Note (Signed)
History and Physical Interval Note:  07/28/2020 11:20 AM  [name]  has presented today for surgery, with the diagnosis of LUNG MASS.  The various methods of treatment have been discussed with the patient and family. After consideration of risks, benefits and other options for treatment, the patient has consented to  Procedure(s): [name] (N/A) as a surgical intervention.  The patient's history has been reviewed, patient examined, no change in status, stable for surgery.  I have reviewed the patient's chart and labs.  Questions were answered to the patient's satisfaction.    I met with the patient in pre-op. Patient is agreeable to proceed. We discussed the risks and benefits. Patient is agreeable to proceed.   [name]

## 2020-07-28 NOTE — Op Note (Addendum)
Video Bronchoscopy with Electromagnetic Navigation Procedure Note  Date of Operation: 07/28/2020  Pre-op Diagnosis: Bilateral lower lobe cystic lesions concerning for multifocal malignancy  Post-op Diagnosis: Bilateral lower lobe cystic lesions concerning for multifocal malignancy  Surgeon: [name], DO   Assistants: None   Anesthesia: General endotracheal anesthesia  Operation: Flexible video fiberoptic bronchoscopy with electromagnetic navigation and biopsies.  Estimated Blood Loss: Minimal  Complications: None   Indications and History: [name] is a 64 y.o. female with lateral lower lobe cystic lesions concerning for multifocal malignancy.  The risks, benefits, complications, treatment options and expected outcomes were discussed with the patient.  The possibilities of pneumothorax, pneumonia, reaction to medication, pulmonary aspiration, perforation of a viscus, bleeding, failure to diagnose a condition and creating a complication requiring transfusion or operation were discussed with the patient who freely signed the consent.    Description of Procedure: The patient was seen in the Preoperative Area, was examined and was deemed appropriate to proceed.  The patient was taken to [facility] endoscopy room 2, identified as [name] and the procedure verified as Flexible Video Fiberoptic Bronchoscopy.  A Time Out was held and the above information confirmed.   Prior to the date of the procedure a high-resolution CT scan of the chest was performed. Utilizing [place] a virtual tracheobronchial tree was generated to allow the creation of distinct navigation pathways to the patient's parenchymal abnormalities. After being taken to the operating room general anesthesia was initiated and the patient  was orally intubated. The video fiberoptic bronchoscope was introduced via the endotracheal tube and a general inspection was performed which showed normal right and left  lung anatomy with no evidence of endobronchial lesion.   Target #1 left lower lobe: The extendable working channel and locator guide were introduced into the bronchoscope.  A full fluoroscopic sweep was obtained with an inspiratory breath-hold of APL 20 cm of water from RAO 25 degrees to LAO 25 degrees completed for local registration and fluoro- navigation.  The distinct navigation pathways prepared prior to this procedure were then utilized to navigate to within 0.8 cm of patient's lesion(s) identified on CT scan. The extendable working channel was secured into place and the locator guide was withdrawn. Under fluoroscopic guidance transbronchial needle brushings, transbronchial Wang needle biopsies, and transbronchial forceps biopsies were performed to be sent for cytology and pathology. A bronchioalveolar lavage was performed in the left lower lobe and sent for cytology.  Target #2 right lower lobe: The extendable working channel and locator guide were introduced into the bronchoscope.  A full fluoroscopic sweep was obtained with an inspiratory breath-hold of APL 20 cm of water from RAO 25 degrees to LAO 25 degrees completed for local registration and fluoro- navigation.  The distinct navigation pathways prepared prior to this procedure were then utilized to navigate to within 1.2 cm of patient's lesion(s) identified on CT scan. The extendable working channel was secured into place and the locator guide was withdrawn. Under fluoroscopic guidance transbronchial needle brushings, transbronchial Wang needle biopsies, and transbronchial forceps biopsies were performed to be sent for cytology and pathology. A bronchioalveolar lavage was performed in the right lower lobe and sent for cytology.  At the end of the procedure a general airway inspection was performed and there was no evidence of active bleeding.  Therapeutic bronchoscope was used for bilateral aspiration of the mainstem's for clearance of any  remaining debris and blood clots.  There was patent distal bilateral subsegments.  There  is no evidence of active bleeding and the bronchoscope was brought to just above the main carina for observation.  The bronchoscope was removed.  The patient tolerated the procedure well. There was no significant blood loss and there were no obvious complications. A post-procedural chest x-ray is pending.  Samples target #1 left lower lobe: 1. Transbronchial needle brushings from left lower lobe 2. Transbronchial Wang needle biopsies from left lower lobe 3. Transbronchial forceps biopsies from left lower lobe 4. Bronchoalveolar lavage from left lower lobe  Samples target #2 right lower lobe: 1. Transbronchial needle brushings from right lower lobe 2. Transbronchial Wang needle biopsies from right lower lobe 3. Transbronchial forceps biopsies from right lower lobe 4. Bronchoalveolar lavage from right lower lobe  Plans:  The patient will be discharged from the PACU to home when recovered from anesthesia and after chest x-ray is reviewed. We will review the cytology, pathology results with the patient when they become available. Outpatient followup will be with [name], DO.   [name], DO [facility] 07/28/2020 2:21 PM

## 2020-07-28 NOTE — Anesthesia Preprocedure Evaluation (Addendum)
Anesthesia Evaluation  Patient identified by MRN, date of birth, ID band Patient awake    Reviewed: Allergy & Precautions, NPO status , Patient's Chart, lab work & pertinent test results  Airway Mallampati: II  TM Distance: >3 FB Neck ROM: Full    Dental no notable dental hx. (+) Loose, Chipped, Missing, Poor Dentition   Pulmonary Current Smoker,    Pulmonary exam normal breath sounds clear to auscultation       Cardiovascular negative cardio ROS Normal cardiovascular exam Rhythm:Regular Rate:Normal     Neuro/Psych PSYCHIATRIC DISORDERS Anxiety Depression negative neurological ROS     GI/Hepatic negative GI ROS, Neg liver ROS,   Endo/Other  negative endocrine ROS  Renal/GU negative Renal ROS     Musculoskeletal  (+) Arthritis ,   Abdominal   Peds  Hematology negative hematology ROS (+)   Anesthesia Other Findings   Reproductive/Obstetrics                            Lab Results  Component Value Date   WBC 10.7 06/23/2020   HGB 14.1 06/23/2020   HCT 43.6 06/23/2020   MCV 86 06/23/2020   PLT 366 06/23/2020   Lab Results  Component Value Date   CREATININE 0.99 06/23/2020   BUN 19 06/23/2020   NA 142 06/23/2020   K 3.7 06/23/2020   CL 103 06/23/2020   CO2 24 06/23/2020    Anesthesia Physical  Anesthesia Plan  ASA: III  Anesthesia Plan: General   Post-op Pain Management:    Induction: Intravenous  PONV Risk Score and Plan: 1 and Ondansetron, Dexamethasone and Treatment may vary due to age or medical condition  Airway Management Planned: Oral ETT  Additional Equipment: None  Intra-op Plan:   Post-operative Plan: Extubation in OR  Informed Consent: I have reviewed the patients History and Physical, chart, labs and discussed the procedure including the risks, benefits and alternatives for the proposed anesthesia with the patient or authorized representative who has  indicated his/her understanding and acceptance.     Dental advisory given  Plan Discussed with: CRNA  Anesthesia Plan Comments:        Anesthesia Quick Evaluation

## 2020-07-28 NOTE — Discharge Instructions (Signed)
Flexible Bronchoscopy, Care After This sheet gives you information about how to care for yourself after your test. Your doctor may also give you more specific instructions. If you have problems or questions, contact your doctor. Follow these instructions at home: Eating and drinking  The day after the test, go back to your normal diet. Driving  Do not drive for 24 hours if you were given a medicine to help you relax (sedative).  Do not drive or use heavy machinery while taking prescription pain medicine. General instructions   Take over-the-counter and prescription medicines only as told by your doctor.  Return to your normal activities as told. Ask what activities are safe for you.  Do not use any products that have nicotine or tobacco in them. This includes cigarettes and e-cigarettes. If you need help quitting, ask your doctor.  Keep all follow-up visits as told by your doctor. This is important. It is very important if you had a tissue sample (biopsy) taken. Get help right away if:  You have shortness of breath that gets worse.  You get light-headed.  You feel like you are going to pass out (faint).  You have chest pain.  You cough up: ? More than a little blood. ? More blood than before. Summary  Do not eat or drink anything (not even water) for 2 hours after your test, or until your numbing medicine wears off.  Do not use cigarettes. Do not use e-cigarettes.  Get help right away if you have chest pain. This information is not intended to replace advice given to you by your health care provider. Make sure you discuss any questions you have with your health care provider. Document Revised: 08/11/2017 Document Reviewed: 09/16/2016 Elsevier Patient Education  2020 [employer].

## 2020-07-28 NOTE — Anesthesia Procedure Notes (Signed)
Procedure Name: Intubation Date/Time: 07/28/2020 1:13 PM Performed by: Valda Favia, CRNA Pre-anesthesia Checklist: Patient identified, Emergency Drugs available, Suction available and Patient being monitored Patient Re-evaluated:Patient Re-evaluated prior to induction Oxygen Delivery Method: Circle System Utilized Preoxygenation: Pre-oxygenation with 100% oxygen Induction Type: IV induction Ventilation: Mask ventilation without difficulty Laryngoscope Size: Mac and 4 Grade View: Grade I Tube type: Oral Tube size: 8.5 mm Number of attempts: 1 Airway Equipment and Method: Stylet and Oral airway Placement Confirmation: ETT inserted through vocal cords under direct vision,  positive ETCO2 and breath sounds checked- equal and bilateral Secured at: 23 cm Tube secured with: Tape Dental Injury: Teeth and Oropharynx as per pre-operative assessment

## 2020-07-28 NOTE — Transfer of Care (Signed)
Immediate Anesthesia Transfer of Care Note  Patient: [name]  Procedure(s) Performed: VIDEO BRONCHOSCOPY WITH ENDOBRONCHIAL NAVIGATION (N/A ) BRONCHIAL BIOPSIES BRONCHIAL BRUSHINGS BRONCHIAL NEEDLE ASPIRATION BIOPSIES BRONCHIAL WASHINGS  Patient Location: Endoscopy Unit  Anesthesia Type:General  Level of Consciousness: awake, alert  and oriented  Airway & Oxygen Therapy: Patient Spontanous Breathing, Patient connected to face mask oxygen and non-rebreather face mask  Post-op Assessment: Report given to RN and Post -op Vital signs reviewed and stable  Post vital signs: Reviewed and stable  Last Vitals:  Vitals Value Taken Time  BP 143/60 07/28/20 1435  Temp 36.5 C 07/28/20 1435  Pulse 101 07/28/20 1439  Resp 23 07/28/20 1439  SpO2 100 % 07/28/20 1439  Vitals shown include unvalidated device data.  Last Pain:  Vitals:   07/28/20 1435  TempSrc: Temporal  PainSc:       Patients Stated Pain Goal: 5 (21/22/48 2500)  Complications: No complications documented.

## 2020-07-29 ENCOUNTER — Telehealth: Payer: Self-pay | Admitting: Pulmonary Disease

## 2020-07-29 DIAGNOSIS — C3492 Malignant neoplasm of unspecified part of left bronchus or lung: Secondary | ICD-10-CM

## 2020-07-29 LAB — CYTOLOGY - NON PAP

## 2020-07-29 NOTE — Addendum Note (Signed)
Addended by: [name] on: 07/29/2020 05:37 PM   Modules accepted: Orders

## 2020-07-29 NOTE — Telephone Encounter (Signed)
PCCM:  I called spoke with the patient regarding her recent pathology results.  The left lower lobe brushings and needle aspirates consistent with adenocarcinoma.  The right showed no malignant cells.  I am unclear if she has multifocal disease or not within the chest.  I have placed referral to medical oncology.  We do not have all of her final path results back yet.  [name], DO [facility] 07/29/2020 5:32 PM

## 2020-07-30 ENCOUNTER — Ambulatory Visit (HOSPITAL_COMMUNITY): Admission: RE | Admit: 2020-07-30 | Payer: Medicare Other | Source: Ambulatory Visit

## 2020-07-30 NOTE — Telephone Encounter (Signed)
Called and spoke with patient to get her scheduled for PFT per [name]. She is now scheduled for 08/03/20 at 9am for PFT. Will route to [name] as Juluis Rainier. Nothing further needed at this time.

## 2020-07-30 NOTE — Anesthesia Postprocedure Evaluation (Signed)
Anesthesia Post Note  Patient: [name]  Procedure(s) Performed: VIDEO BRONCHOSCOPY WITH ENDOBRONCHIAL NAVIGATION (N/A ) BRONCHIAL BIOPSIES BRONCHIAL BRUSHINGS BRONCHIAL NEEDLE ASPIRATION BIOPSIES BRONCHIAL WASHINGS     Patient location during evaluation: PACU Anesthesia Type: General Level of consciousness: sedated and patient cooperative Pain management: pain level controlled Vital Signs Assessment: post-procedure vital signs reviewed and stable Respiratory status: spontaneous breathing Cardiovascular status: stable Anesthetic complications: no   No complications documented.  Last Vitals:  Vitals:   07/28/20 1520 07/28/20 1530  BP: (!) 150/67 (!) 157/73  Pulse: 82 92  Resp: (!) 22 15  Temp:    SpO2: 90% 92%    Last Pain:  Vitals:   07/28/20 1520  TempSrc:   PainSc: [place]

## 2020-08-03 ENCOUNTER — Telehealth: Payer: Self-pay | Admitting: Internal Medicine

## 2020-08-03 ENCOUNTER — Telehealth: Payer: Self-pay | Admitting: Pulmonary Disease

## 2020-08-03 ENCOUNTER — Ambulatory Visit (INDEPENDENT_AMBULATORY_CARE_PROVIDER_SITE_OTHER): Payer: Medicare Other | Admitting: Pulmonary Disease

## 2020-08-03 ENCOUNTER — Other Ambulatory Visit: Payer: Self-pay

## 2020-08-03 DIAGNOSIS — C3492 Malignant neoplasm of unspecified part of left bronchus or lung: Secondary | ICD-10-CM | POA: Diagnosis not present

## 2020-08-03 LAB — PULMONARY FUNCTION TEST
DL/VA % pred: 57 %
DL/VA: 2.32 ml/min/mmHg/L
DLCO cor % pred: 40 %
DLCO cor: 9.26 ml/min/mmHg
DLCO unc % pred: 41 %
DLCO unc: 9.46 ml/min/mmHg
FEF 25-75 Post: 0.91 L/sec
FEF 25-75 Pre: 0.82 L/sec
FEF2575-%Change-Post: 10 %
FEF2575-%Pred-Post: 41 %
FEF2575-%Pred-Pre: 37 %
FEV1-%Change-Post: -10 %
FEV1-%Pred-Post: 52 %
FEV1-%Pred-Pre: 58 %
FEV1-Post: 1.22 L
FEV1-Pre: 1.37 L
FEV1FVC-%Change-Post: -15 %
FEV1FVC-%Pred-Pre: 86 %
FEV6-%Change-Post: 4 %
FEV6-%Pred-Post: 72 %
FEV6-%Pred-Pre: 68 %
FEV6-Post: 2.08 L
FEV6-Pre: 1.99 L
FEV6FVC-%Change-Post: 0 %
FEV6FVC-%Pred-Post: 103 %
FEV6FVC-%Pred-Pre: 102 %
FVC-%Change-Post: 5 %
FVC-%Pred-Post: 71 %
FVC-%Pred-Pre: 67 %
FVC-Post: 2.15 L
FVC-Pre: 2.03 L
Post FEV1/FVC ratio: 57 %
Post FEV6/FVC ratio: 100 %
Pre FEV1/FVC ratio: 67 %
Pre FEV6/FVC Ratio: 99 %
RV % pred: 133 %
RV: 3.05 L
TLC % pred: 90 %
TLC: 5.13 L

## 2020-08-03 MED ORDER — NICOTINE 7 MG/24HR TD PT24: 7.0000 mg | MEDICATED_PATCH | Freq: Every day | TRANSDERMAL | 1 refills | Status: DC

## 2020-08-03 NOTE — Telephone Encounter (Signed)
Scheduled appointment per new patient referral. Spoke to patient who is aware of appointment date and time.  

## 2020-08-03 NOTE — Telephone Encounter (Signed)
Pt is requesting to have nicotine patches called into [facility] on [address], please. Pt can be reached at [phone].

## 2020-08-03 NOTE — Telephone Encounter (Signed)
Spoke with pt and made aware I have sent rx  If not covered can purchase these OTC  Pt verbalized understanding and nothing further needed

## 2020-08-03 NOTE — Telephone Encounter (Signed)
Spoke with the pt  She stopped smoking 07/31/20  She had been at about 1/2 ppd before she stopped  She is asking for nicotine patches to be called in  Please advise what strength you want [place] to call in, thanks!

## 2020-08-03 NOTE — Telephone Encounter (Signed)
Ok to fill  21mcg patches daily These can also be purchases OTC  [name], DO [facility] 08/03/2020 3:20 PM

## 2020-08-03 NOTE — Progress Notes (Signed)
PFT done today. 

## 2020-08-13 ENCOUNTER — Ambulatory Visit (INDEPENDENT_AMBULATORY_CARE_PROVIDER_SITE_OTHER): Payer: Medicare Other | Admitting: Family Medicine

## 2020-08-13 ENCOUNTER — Other Ambulatory Visit: Payer: Self-pay

## 2020-08-13 ENCOUNTER — Other Ambulatory Visit: Payer: Self-pay | Admitting: *Deleted

## 2020-08-13 DIAGNOSIS — M940 Chondrocostal junction syndrome [Tietze]: Secondary | ICD-10-CM | POA: Diagnosis not present

## 2020-08-13 MED ORDER — PREDNISONE 10 MG PO TABS: 10.0000 mg | ORAL_TABLET | Freq: Every day | ORAL | 0 refills | Status: DC

## 2020-08-13 NOTE — Progress Notes (Signed)
The proposed treatment discussed in cancer conference 12/2 is for discussion purpose only and is not a binding recommendation.  The patient was not physically examined nor present for their treatment options. Therefore, final treatment plans cannot be decided.

## 2020-08-13 NOTE — Progress Notes (Signed)
    SUBJECTIVE:   CHIEF COMPLAINT / HPI: Chest pain  Patient reports that she has been experiencing chest pain in the center of her chest for 1 week.  She states that she suddenly experiences pain while waking up.  She denies that the pain has a deep sensation but cannot feel a palpable nodule or raised area.  She denies any changes to her skin.  She denies any recent trauma but does note that she had a bronchoscopy on 11/16 for a biopsy for a lung lesion.  She states that she does not have any trouble breathing and has not been having a frequent cough.  Area is tender to touch, she denies any swelling.  She has not had any fevers or chills.  She has not had this occur before.  PERTINENT  PMH / PSH:  Adenocarcinoma of lung Tobacco use  OBJECTIVE:   BP 134/68   Pulse 95   Wt 176 lb (79.8 kg)   SpO2 93%   BMI 25.99 kg/m   PE  General: female appearing stated age in no acute distress Cardio: RRR Pulm/chest: Reproducible parasternal pain with palpation along costochondral junction,Decreased lung sounds bilaterally and throughout, no wheezing no coughing, no respiratory distress on exam Extremities: No peripheral edema. Warm/ well perfused. Neuro: pt alert and oriented x4   ASSESSMENT/PLAN:   Costochondritis, acute Given physical exam findings and pain that is reproducible upon palpation and superficial nature of pain without any alarming signs of cardiac pain such as deep chest pain, no radiation to the jaw, no radiation into her arm, no GI upset, no difficulty breathing. Patient encouraged to use heating pad on the area as well as over-the-counter medications like Tylenol to help with pain Handout provided with education regarding costochondritis -Prednisone course prescribed 10 mg to help with anti-inflammatories patient states she cannot take NSAIDs     Eulis Foster, MD [place]

## 2020-08-13 NOTE — Patient Instructions (Signed)
It is likely that you experiencing costochondritis which is irritation to the bones/cartilage near your breastbone.  I recommend taking regularly scheduled Tylenol to help with the pain in addition to applying heating packs to the area.  If this pain worsens and you begin to experience increasing difficulty breathing, recommend going to the ED for urgent evaluation. Costochondritis Costochondritis is swelling and irritation (inflammation) of the tissue (cartilage) that connects your ribs to your breastbone (sternum). This causes pain in the front of your chest. Usually, the pain:  Starts gradually.  Is in more than one rib. This condition usually goes away on its own over time. Follow these instructions at home:  Do not do anything that makes your pain worse.  If directed, put ice on the painful area: ? Put ice in a plastic bag. ? Place a towel between your skin and the bag. ? Leave the ice on for 20 minutes, 2-3 times a day.  If directed, put heat on the affected area as often as told by your doctor. Use the heat source that your doctor tells you to use, such as a moist heat pack or a heating pad. ? Place a towel between your skin and the heat source. ? Leave the heat on for 20-30 minutes. ? Take off the heat if your skin turns bright red. This is very important if you cannot feel pain, heat, or cold. You may have a greater risk of getting burned.  Take over-the-counter and prescription medicines only as told by your doctor.  Return to your normal activities as told by your doctor. Ask your doctor what activities are safe for you.  Keep all follow-up visits as told by your doctor. This is important. Contact a doctor if:  You have chills or a fever.  Your pain does not go away or it gets worse.  You have a cough that does not go away. Get help right away if:  You are short of breath. This information is not intended to replace advice given to you by your health care provider.  Make sure you discuss any questions you have with your health care provider. Document Revised: 09/13/2017 Document Reviewed: 12/23/2015 Elsevier Patient Education  2020 [employer].

## 2020-08-14 DIAGNOSIS — M940 Chondrocostal junction syndrome [Tietze]: Secondary | ICD-10-CM

## 2020-08-14 HISTORY — DX: Chondrocostal junction syndrome (tietze): M94.0

## 2020-08-14 NOTE — Assessment & Plan Note (Signed)
Given physical exam findings and pain that is reproducible upon palpation and superficial nature of pain without any alarming signs of cardiac pain such as deep chest pain, no radiation to the jaw, no radiation into her arm, no GI upset, no difficulty breathing. Patient encouraged to use heating pad on the area as well as over-the-counter medications like Tylenol to help with pain Handout provided with education regarding costochondritis -Prednisone course prescribed 10 mg to help with anti-inflammatories patient states she cannot take NSAIDs

## 2020-08-17 ENCOUNTER — Ambulatory Visit (HOSPITAL_COMMUNITY)
Admission: RE | Admit: 2020-08-17 | Discharge: 2020-08-17 | Disposition: A | Discharge status: Home / Self Care | Payer: Medicare Other | Source: Ambulatory Visit | Attending: Pulmonary Disease | Admitting: Pulmonary Disease

## 2020-08-17 ENCOUNTER — Other Ambulatory Visit: Payer: Self-pay | Admitting: Physician Assistant

## 2020-08-17 ENCOUNTER — Other Ambulatory Visit: Payer: Self-pay

## 2020-08-17 DIAGNOSIS — R918 Other nonspecific abnormal finding of lung field: Secondary | ICD-10-CM | POA: Diagnosis not present

## 2020-08-17 DIAGNOSIS — C3490 Malignant neoplasm of unspecified part of unspecified bronchus or lung: Secondary | ICD-10-CM

## 2020-08-17 DIAGNOSIS — R911 Solitary pulmonary nodule: Secondary | ICD-10-CM | POA: Diagnosis not present

## 2020-08-17 LAB — GLUCOSE, CAPILLARY: Glucose-Capillary: 92 mg/dL (ref 70–99)

## 2020-08-17 MED ORDER — FLUDEOXYGLUCOSE F - 18 (FDG) INJECTION
8.8000 | Freq: Once | INTRAVENOUS | Status: AC | PRN
  Administered 2020-08-17: 8.8 via INTRAVENOUS

## 2020-08-18 ENCOUNTER — Other Ambulatory Visit: Payer: Self-pay

## 2020-08-18 ENCOUNTER — Encounter: Payer: Self-pay | Admitting: Internal Medicine

## 2020-08-18 ENCOUNTER — Inpatient Hospital Stay: Payer: Medicare Other

## 2020-08-18 ENCOUNTER — Inpatient Hospital Stay: Payer: Medicare Other | Attending: Internal Medicine | Admitting: Internal Medicine

## 2020-08-18 VITALS — BP 150/83 | HR 90 | Temp 97.2°F | Resp 15 | Ht 69.0 in | Wt 175.3 lb

## 2020-08-18 DIAGNOSIS — R918 Other nonspecific abnormal finding of lung field: Secondary | ICD-10-CM

## 2020-08-18 DIAGNOSIS — Z79899 Other long term (current) drug therapy: Secondary | ICD-10-CM | POA: Diagnosis not present

## 2020-08-18 DIAGNOSIS — N3289 Other specified disorders of bladder: Secondary | ICD-10-CM

## 2020-08-18 DIAGNOSIS — C679 Malignant neoplasm of bladder, unspecified: Secondary | ICD-10-CM | POA: Insufficient documentation

## 2020-08-18 DIAGNOSIS — C3492 Malignant neoplasm of unspecified part of left bronchus or lung: Secondary | ICD-10-CM

## 2020-08-18 DIAGNOSIS — C3432 Malignant neoplasm of lower lobe, left bronchus or lung: Secondary | ICD-10-CM | POA: Diagnosis not present

## 2020-08-18 DIAGNOSIS — N329 Bladder disorder, unspecified: Secondary | ICD-10-CM | POA: Diagnosis not present

## 2020-08-18 LAB — CBC WITH DIFFERENTIAL (CANCER CENTER ONLY)
Abs Immature Granulocytes: 0.02 10*3/uL (ref 0.00–0.07)
Basophils Absolute: 0.1 10*3/uL (ref 0.0–0.1)
Basophils Relative: 1 %
Eosinophils Absolute: 0.2 10*3/uL (ref 0.0–0.5)
Eosinophils Relative: 2 %
HCT: 41.6 % (ref 36.0–46.0)
Hemoglobin: 13.1 g/dL (ref 12.0–15.0)
Immature Granulocytes: 0 %
Lymphocytes Relative: 26 %
Lymphs Abs: 2.3 10*3/uL (ref 0.7–4.0)
MCH: 27.1 pg (ref 26.0–34.0)
MCHC: 31.5 g/dL (ref 30.0–36.0)
MCV: 86.1 fL (ref 80.0–100.0)
Monocytes Absolute: 0.7 10*3/uL (ref 0.1–1.0)
Monocytes Relative: 8 %
Neutro Abs: 5.6 10*3/uL (ref 1.7–7.7)
Neutrophils Relative %: 63 %
Platelet Count: 360 10*3/uL (ref 150–400)
RBC: 4.83 MIL/uL (ref 3.87–5.11)
RDW: 13.7 % (ref 11.5–15.5)
WBC Count: 8.9 10*3/uL (ref 4.0–10.5)
nRBC: 0 % (ref 0.0–0.2)

## 2020-08-18 LAB — CMP (CANCER CENTER ONLY)
ALT: 37 U/L (ref 0–44)
AST: 25 U/L (ref 15–41)
Albumin: 3.9 g/dL (ref 3.5–5.0)
Alkaline Phosphatase: 94 U/L (ref 38–126)
Anion gap: 6 (ref 5–15)
BUN: 17 mg/dL (ref 8–23)
CO2: 31 mmol/L (ref 22–32)
Calcium: 10 mg/dL (ref 8.9–10.3)
Chloride: 105 mmol/L (ref 98–111)
Creatinine: 1.05 mg/dL — ABNORMAL HIGH (ref 0.44–1.00)
GFR, Estimated: 59 mL/min — ABNORMAL LOW (ref >60)
Glucose, Bld: 80 mg/dL (ref 70–99)
Potassium: 4.5 mmol/L (ref 3.5–5.1)
Sodium: 142 mmol/L (ref 135–145)
Total Bilirubin: 1 mg/dL (ref 0.3–1.2)
Total Protein: 7.9 g/dL (ref 6.5–8.1)

## 2020-08-18 NOTE — Progress Notes (Signed)
[place] [phone]   [phone]  CONSULT NOTE  REFERRING PHYSICIAN: Dr. Leory Plowman Icard  REASON FOR CONSULTATION:  64 years old African-American female recently diagnosed with lung cancer.  HPI [name] is a 64 y.o. female with long history of his smoking and past medical history significant for allergies, and anxiety/depression as well as osteoarthritis.  The patient was seen recently by her primary care physician and because of her long smoking history she order CT screening of the chest which was performed on July 08, 2020 and it showed dominant irregular solid and subsolid pulmonary lesions identified in the lower lobes bilaterally concerning for multifocal adenocarcinoma.  The patient was referred to [name] and he ordered CT super D of the chest without contrast which was performed on July 24, 2020 and it showed a dominant 3.2 x 5.6 cm mixed cystic/solid lesion in the posterior left lower lobe.  There was additional dominant 3.9 x 6.6 cavitary lesion with thickened wall and mural nodularity anteriorly also suspicious.  On July 28, 2020 the patient underwent video bronchoscopy with electromagnetic navigation procedure under the care of [name].  The final pathology ([id number]) of the left lower lobe showed malignant cells consistent with adenocarcinoma.  The fine-needle aspiration of the right lower lobe showed no malignant cells identified.  The patient had a PET scan on August 17, 2020 and it showed the subsolid mass in the superior segment of the left lower lobe measuring about 4.6 x 2.3 cm has maximum SUV of 3.0 suspicious for low-grade malignancy.  The irregular primarily cystic lesion posteriorly in the right lower lobe measuring 5.8 x 3.3 cm with marginal regular thickening has a maximum SUV of 2.4 and morphologically also concerning for the possibility of low-grade malignancy.  The scan also showed a 2.0 x 1.6 cm soft tissue  density lesion along the lumen of the right posterior urinary bladder suspicious for transitional cell carcinoma. [name] kindly referred the patient to me today for evaluation and recommendation regarding treatment of her condition. When seen today the patient is feeling fine except for mild cough and shortness of breath with exertion but no significant chest pain or hemoptysis.  She has no nausea, vomiting, diarrhea or constipation.  She denied having any visual changes but has occasional headache.  She has no weight loss or night sweats. Family history significant for father with pancreatic cancer and brother with liver cancer. The patient is married and has 2 children and 5 grandchildren.  She was accompanied by her stepsister Pam.  The patient used to work as a Pharmacist, hospital.  She has a history for smoking more than 1 pack/day for around 50 years.  She drinks alcohol occasionally and no history of drug abuse.  HPI  Past Medical History:  Diagnosis Date  . Allergy    seasonal  . Anxiety 2009  . Arthritis 2000  . Depression   . Depression 07/19/2013  . Fibroid 2003  . Organ donation 11/29/2019  . Wears glasses     Past Surgical History:  Procedure Laterality Date  . ARTHROTOMY Right 11/09/2015   Procedure: RIGHT WRIST DISTAL RADIAL ULNAR JOINT ARTHROTOMY AND DEBRIDEMENT,  AND ;  Surgeon: Iran Planas, MD;  Location: [place];  Service: Orthopedics;  Laterality: Right;  . BRONCHIAL BIOPSY  07/28/2020   Procedure: BRONCHIAL BIOPSIES;  Surgeon: [name], DO;  Location: [facility];  Service: Pulmonary;;  . BRONCHIAL BRUSHINGS  07/28/2020   Procedure:  BRONCHIAL BRUSHINGS;  Surgeon: [name], DO;  Location: [facility];  Service: Pulmonary;;  . BRONCHIAL NEEDLE ASPIRATION BIOPSY  07/28/2020   Procedure: BRONCHIAL NEEDLE ASPIRATION BIOPSIES;  Surgeon: [name], DO;  Location: [facility];  Service: Pulmonary;;  . BRONCHIAL WASHINGS  07/28/2020   Procedure: BRONCHIAL  WASHINGS;  Surgeon: [name], DO;  Location: [facility] [facility];  Service: Pulmonary;;  . COLONOSCOPY    . HARDWARE REMOVAL Right 07/24/2013   Procedure: RIGHT WRIST HARDWARE REMOVAL, JOINT RELEASE, RIGHT HAND MANIPULATION UNDER ANESTHESIA;  Surgeon: Linna Hoff, MD;  Location: [place];  Service: Orthopedics;  Laterality: Right;  . NECK SURGERY  2003   Cervical vertebroplasty   . OPEN REDUCTION INTERNAL FIXATION (ORIF) DISTAL RADIAL FRACTURE Right 11/08/2012   Procedure: OPEN REDUCTION INTERNAL FIXATION (ORIF) RIGHT DISTAL RADIUS FRACTURE;  Surgeon: Linna Hoff, MD;  Location: [place];  Service: Orthopedics;  Laterality: Right;  Marland Kitchen VIDEO BRONCHOSCOPY WITH ENDOBRONCHIAL NAVIGATION N/A 07/28/2020   Procedure: VIDEO BRONCHOSCOPY WITH ENDOBRONCHIAL NAVIGATION;  Surgeon: [name], DO;  Location: [place];  Service: Pulmonary;  Laterality: N/A;  . WRIST ARTHROPLASTY Right 11/09/2015   Procedure: OR DISTAL ULNAR RESECTION ARTHROPLASTY ;  Surgeon: Iran Planas, MD;  Location: [place];  Service: Orthopedics;  Laterality: Right;  . WRIST ARTHROSCOPY WITH DEBRIDEMENT Right 07/21/2014   Procedure: RIGHT WRIST DISTAL RADIAL ULNAR JOINT DEBRIDEMENT AND JOINT RELEASE POSSIBLE TENDON INTERPOSITION;  Surgeon: Linna Hoff, MD;  Location: [place];  Service: Orthopedics;  Laterality: Right;    Family History  Problem Relation Age of Onset  . Hypertension Mother   . Hyperlipidemia Mother   . Early death Sister 73       Drug overdose  . Dementia Father   . Cancer Father        Throat cancer   . Parkinson's disease Father   . Hypertension Daughter   . Diabetes Daughter   . Cancer Brother        Throat    Social History Social History   Tobacco Use  . Smoking status: Former Smoker    Packs/day: 1.00    Years: 41.00    Pack years: 41.00    Types: Cigarettes    Start date: 04/13/1975    Quit date: 07/14/2020    Years since quitting: 0.0  . Smokeless  tobacco: Never Used  Vaping Use  . Vaping Use: Never used  Substance Use Topics  . Alcohol use: No    Comment: Holidays only  . Drug use: No    Allergies  Allergen Reactions  . Codeine Nausea And Vomiting and Other (See Comments)    Hallucinations    Current Outpatient Medications  Medication Sig Dispense Refill  . acetaminophen (TYLENOL) 650 MG CR tablet Take 650 mg by mouth every 8 (eight) hours as needed for pain.    . Ascorbic Acid (VITAMIN C ADULT GUMMIES PO) Take 1 tablet by mouth daily.    Marland Kitchen atorvastatin (LIPITOR) 40 MG tablet Take 1 tablet (40 mg total) by mouth daily. 90 tablet 3  . azelastine (OPTIVAR) 0.05 % ophthalmic solution Place 1 drop into both eyes 2 (two) times daily as needed for allergies.    . carbamide peroxide (DEBROX) 6.5 % OTIC solution Place 5 drops into both ears 2 (two) times daily. (Patient taking differently: Place 5 drops into both ears 2 (two) times daily as needed (ear pain). ) 15 mL 0  . cetirizine (  ZYRTEC) 10 MG tablet Take 1 tablet (10 mg total) by mouth daily. (Patient taking differently: Take 10 mg by mouth daily as needed for allergies. ) 30 tablet 6  . Cholecalciferol (VITAMIN D) 2000 units CAPS Take 1 capsule (2,000 Units total) by mouth daily. 90 capsule 11  . clobetasol cream (TEMOVATE) 0.35 % Apply 1 application topically 2 (two) times daily as needed. (Patient taking differently: Apply 1 application topically 2 (two) times daily as needed (rash). ) 30 g 2  . conjugated estrogens (PREMARIN) vaginal cream Place 1 Applicatorful vaginally daily. (Patient taking differently: Place 1 Applicatorful vaginally at bedtime. ) 42.5 g 12  . cyclobenzaprine (FLEXERIL) 10 MG tablet Take 1 tablet (10 mg total) by mouth 2 (two) times daily as needed for muscle spasms. 20 tablet 0  . docusate sodium (COLACE) 100 MG capsule Take 1 capsule (100 mg total) by mouth 2 (two) times daily. 180 capsule 1  . fluticasone (FLONASE) 50 MCG/ACT nasal spray Place 2 sprays  into both nostrils daily. (Patient taking differently: Place 2 sprays into both nostrils daily as needed for allergies. ) 16 g 6  . gabapentin (NEURONTIN) 300 MG capsule Take 3 capsules (900 mg total) by mouth 3 (three) times daily. (Patient taking differently: Take 300-600 mg by mouth daily as needed (pain). ) 270 capsule 3  . hydrOXYzine (ATARAX/VISTARIL) 50 MG tablet Take 0.5 tablets (25 mg total) by mouth at bedtime as needed. (Patient taking differently: Take 25 mg by mouth at bedtime as needed for anxiety or itching (sleep). ) 60 tablet 2  . Melatonin 10 MG TBCR Take 10 mg by mouth at bedtime. 90 tablet 2  . nicotine (NICODERM CQ - DOSED IN MG/24 HR) 7 mg/24hr patch Place 1 patch (7 mg total) onto the skin daily. 28 patch 1  . predniSONE (DELTASONE) 10 MG tablet Take 1 tablet (10 mg total) by mouth daily with breakfast for 5 days. 5 tablet 0   No current facility-administered medications for this visit.    Review of Systems  Constitutional: positive for fatigue Eyes: negative Ears, nose, mouth, throat, and face: negative Respiratory: positive for dyspnea on exertion Cardiovascular: negative Gastrointestinal: negative Genitourinary:negative Integument/breast: negative Hematologic/lymphatic: negative Musculoskeletal:negative Neurological: negative Behavioral/Psych: negative Endocrine: negative Allergic/Immunologic: negative  Physical Exam  KKX:FGHWE, healthy, no distress, well nourished and well developed SKIN: skin color, texture, turgor are normal, no rashes or significant lesions HEAD: Normocephalic, No masses, lesions, tenderness or abnormalities EYES: normal, PERRLA, Conjunctiva are pink and non-injected EARS: External ears normal, Canals clear OROPHARYNX:no exudate, no erythema and lips, buccal mucosa, and tongue normal  NECK: supple, no adenopathy, no JVD LYMPH:  no palpable lymphadenopathy, no hepatosplenomegaly BREAST:not examined LUNGS: clear to auscultation , and  palpation HEART: regular rate & rhythm, no murmurs and no gallops ABDOMEN:abdomen soft, non-tender, normal bowel sounds and no masses or organomegaly BACK: No CVA tenderness, Range of motion is normal EXTREMITIES:no joint deformities, effusion, or inflammation, no edema  NEURO: alert & oriented x 3 with fluent speech, no focal motor/sensory deficits  PERFORMANCE STATUS: ECOG 1  LABORATORY DATA: Lab Results  Component Value Date   WBC 8.9 08/18/2020   HGB 13.1 08/18/2020   HCT 41.6 08/18/2020   MCV 86.1 08/18/2020   PLT 360 08/18/2020      Chemistry      Component Value Date/Time   NA 142 07/28/2020 1048   NA 142 06/23/2020 1237   K 3.9 07/28/2020 1048   CL 108 07/28/2020 1048  CO2 22 07/28/2020 1048   BUN 14 07/28/2020 1048   BUN 19 06/23/2020 1237   CREATININE 0.98 07/28/2020 1048   CREATININE 0.95 02/10/2015 1526      Component Value Date/Time   CALCIUM 9.2 07/28/2020 1048   ALKPHOS 84 10/30/2017 1120   AST 12 10/30/2017 1120   ALT 17 10/30/2017 1120   BILITOT 0.6 10/30/2017 1120       RADIOGRAPHIC STUDIES: NM PET Image Initial (PI) Skull Base To Thigh  Result Date: 08/17/2020 CLINICAL DATA:  Initial treatment strategy for lung masses. EXAM: NUCLEAR MEDICINE PET SKULL BASE TO THIGH TECHNIQUE: 8.8 mCi F-18 FDG was injected intravenously. Full-ring PET imaging was performed from the skull base to thigh after the radiotracer. CT data was obtained and used for attenuation correction and anatomic localization. Fasting blood glucose: 92 mg/dl COMPARISON:  Chest CT 07/24/2020 FINDINGS: Mediastinal blood pool activity: SUV max 2.5 Liver activity: SUV max NA NECK: Symmetric physiologic activity in the palatine tonsils. No hypermetabolic adenopathy identified. Incidental CT findings: none CHEST: The slightly nodular region of architectural distortion measuring about 0.4 cm in thickness on image 54 of series 4 has a maximum SUV of 0.9. The sub solid mass in the superior segment  left lower lobe measuring about 4.6 by 2.3 cm on image 68 of series 4 has a maximum SUV of 3.0, suspicious for low-grade malignancy. The irregular primarily cystic lesion posteriorly in the right lower lobe measuring 5.8 by 3.3 cm with marginal irregular thickening has a maximum SUV of 2.4, and morphologically is also concerning for the possibility of low-grade malignancy. The previously seen nodularity adjacent to the major fissure in the right lower lobe is shown today on image 80 of series 4, the individual nodules are below PET-CT size sensitivity and not obviously hypermetabolic. Incidental CT findings: Severe emphysema. Aberrant right subclavian artery passes behind the esophagus. Atherosclerotic calcification of the aortic arch. A left subpectoral node measuring 0.7 cm in short axis on image 55 of series 4 has a fatty hilum and low-grade associated metabolic activity with maximum SUV of 2.2, which is less than blood pool hence probably benign. ABDOMEN/PELVIS: Although not measurable in metabolic activity due to highly concentrated FDG in the adjacent urine, there is a 2.0 by 1.6 cm soft tissue density lesion along the lumen of the right posterior urinary bladder on image 176 of series 4 suspicious for transitional cell carcinoma. A blood clot or fungus ball might have a similar appearance although urothelial tumor is favored. Incidental CT findings: Mild right hydronephrosis without hydroureter, query right UPJ stenosis. Right kidney upper pole photopenic cyst. Aortoiliac atherosclerotic vascular disease. SKELETON: No significant abnormal hypermetabolic activity in this region. Incidental CT findings: Cervical plate and screw fixator. IMPRESSION: 1. 2.0 by 1.6 cm intraluminal soft tissue density along the right posterior urinary bladder is suspicious for a urothelial tumor. Cystoscopy is recommended. 2. Sub solid left lower lobe mass and sub solid right lower lobe mass both have low-grade metabolic activity  suspicious for low-grade malignancy. 3. Other imaging findings of potential clinical significance: Aortic Atherosclerosis (ICD10-I70.0) and Emphysema (ICD10-J43.9). Average right subclavian artery. Mild right hydronephrosis but without hydroureter, query right UPJ stenosis. Electronically Signed   By: Van Clines M.D.   On: 08/17/2020 14:07   DG CHEST PORT 1 VIEW  Result Date: 07/28/2020 CLINICAL DATA:  Post bronchoscopy with biopsy EXAM: PORTABLE CHEST 1 VIEW COMPARISON:  05/30/2005 FINDINGS: The heart is normal size. Increasing opacities in the lower lungs, right greater  than left, likely related to post bronchoscopy state. No visible pneumothorax. No effusions or acute bony abnormality. IMPRESSION: Increasing bibasilar opacities, likely related to bronchoscopy. No pneumothorax. Electronically Signed   By: Rolm Baptise M.D.   On: 07/28/2020 15:08   CT Super D Chest Wo Contrast  Result Date: 07/24/2020 CLINICAL DATA:  Follow-up lung lesions EXAM: CT CHEST WITHOUT CONTRAST TECHNIQUE: Multidetector CT imaging of the chest was performed using thin slice collimation for electromagnetic bronchoscopy planning purposes, without intravenous contrast. COMPARISON:  Low-dose lung cancer screening CT chest dated 07/07/2020 FINDINGS: Cardiovascular: Heart is normal in size.  No pericardial effusion. No evidence of thoracic aortic aneurysm. Atherosclerotic calcifications of the aortic arch. Aberrant right subclavian artery. Mediastinum/Nodes: No suspicious mediastinal lymphadenopathy. Visualized thyroid is unremarkable. Lungs/Pleura: Moderate centrilobular and paraseptal emphysematous changes, upper lung predominant. Dominant 3.2 x 5.6 cm mixed cystic/solid lesion in the posterior left lower lobe (series 5/image 68), suspicious. Additional dominant 3.9 x 6.6 cm cavitary lesion with thickened wall and mural nodularity anteriorly (series 5/image 96), suspicious. Additional scattered thin walled cysts/cavitary  lesions, for example in the right middle and right lower lobe (series 5/images 66, 67, and 86), without solid component or mural nodularity, but at least warranting follow-up. Nodular scarring in the right lung apex (series 5/image 34). 4 mm subpleural nodule in the anterior right lower lobe (series 5/image 88), likely benign. No focal consolidation. No pleural effusion or pneumothorax. Upper Abdomen: Visualized upper abdomen is notable for a 4.1 cm exophytic medial right upper pole renal cyst and mild vascular calcifications. Musculoskeletal: Mild degenerative changes of the visualized thoracolumbar spine. Cervical spine fixation hardware, incompletely visualized. IMPRESSION: Dominant mixed cystic/solid lesions in the bilateral lower lobes, as described above. These remain suspicious for multifocal adenocarcinoma. Aortic Atherosclerosis (ICD10-I70.0) and Emphysema (ICD10-J43.9). Electronically Signed   By: Julian Hy M.D.   On: 07/24/2020 10:58   DG C-ARM BRONCHOSCOPY  Result Date: 07/28/2020 C-ARM BRONCHOSCOPY: Fluoroscopy was utilized by the requesting physician.  No radiographic interpretation.    ASSESSMENT: This is a very pleasant 64 years old African-American female with highly suspicious stage IIA (T2b, N0, M0) non-small cell lung cancer, adenocarcinoma diagnosed in November 2021 presented with left lower lobe lung mass but there is also suspicious right lower lobe cystic lesion that not completely confirmed to be malignant but highly suspicious.  The patient was also found on the PET scan to have a suspicious lesion and the posterior urinary bladder suspicious for urothelial tumor.   PLAN: I had a lengthy discussion with the patient and her stepsister about her current condition and treatment options. I recommended for the patient to see Dr. Kipp Brood for evaluation and consideration of surgical resection of the left lower lobe lung mass with close monitoring of the right lower lobe  lesion which may require either surgical resection or repeat biopsy in the future. For the suspicious right posterior renal bladder soft tissue density, I will refer the patient to urology for evaluation and consideration of cystoscopy and confirmation of the diagnosis. I will see the patient back for follow-up visit after her evaluation by surgery.  If she is not a surgical candidate, she may benefit from curative radiotherapy to these lesions. The patient is in agreement with the current plan. She was advised to call immediately if she has any concerning symptoms in the interval. The patient voices understanding of current disease status and treatment options and is in agreement with the current care plan.  All questions were answered. The  patient knows to call the clinic with any problems, questions or concerns. We can certainly see the patient much sooner if necessary.  Thank you so much for allowing me to participate in the care of Hindy B Stroble. I will continue to follow up the patient with you and assist in her care.  The total time spent in the appointment was 60 minutes.  Disclaimer: This note was dictated with voice recognition software. Similar sounding words can inadvertently be transcribed and may not be corrected upon review.   Eilleen Kempf August 18, 2020, 2:04 PM

## 2020-08-20 ENCOUNTER — Telehealth: Payer: Self-pay | Admitting: Internal Medicine

## 2020-08-20 NOTE — Telephone Encounter (Signed)
Released records to [facility] urology to [phone]   Release: [phone]

## 2020-08-26 ENCOUNTER — Encounter: Payer: Self-pay | Admitting: *Deleted

## 2020-08-26 NOTE — Progress Notes (Signed)
Oncology Nurse Navigator Documentation  Oncology Nurse Navigator Flowsheets 08/26/2020  Abnormal Finding Date 07/08/2020  Confirmed Diagnosis Date 07/28/2020  Diagnosis Status Confirmed Diagnosis Complete  Navigator Follow Up Date: 08/31/2020  Navigator Follow Up Reason: Review Note  Navigator Location [facility]  Navigator Encounter Type Other:  Treatment Phase Pre-Tx/Tx Discussion  Barriers/Navigation Needs Coordination of Care  Interventions Coordination of Care  Acuity Level 2-Minimal Needs (1-2 Barriers Identified)  Coordination of Care Other  Time Spent with Patient 30

## 2020-08-28 ENCOUNTER — Ambulatory Visit (INDEPENDENT_AMBULATORY_CARE_PROVIDER_SITE_OTHER): Payer: Medicare Other | Admitting: Pulmonary Disease

## 2020-08-28 ENCOUNTER — Encounter: Payer: Self-pay | Admitting: Thoracic Surgery (Cardiothoracic Vascular Surgery)

## 2020-08-28 ENCOUNTER — Institutional Professional Consult (permissible substitution) (INDEPENDENT_AMBULATORY_CARE_PROVIDER_SITE_OTHER): Payer: Medicare Other | Admitting: Thoracic Surgery (Cardiothoracic Vascular Surgery)

## 2020-08-28 ENCOUNTER — Other Ambulatory Visit: Payer: Self-pay

## 2020-08-28 ENCOUNTER — Encounter: Payer: Self-pay | Admitting: Pulmonary Disease

## 2020-08-28 VITALS — BP 122/70 | HR 69 | Temp 98.4°F | Ht 68.0 in | Wt 174.4 lb

## 2020-08-28 VITALS — BP 127/82 | HR 102 | Resp 20 | Ht 69.0 in | Wt 174.0 lb

## 2020-08-28 DIAGNOSIS — J432 Centrilobular emphysema: Secondary | ICD-10-CM | POA: Diagnosis not present

## 2020-08-28 DIAGNOSIS — C3490 Malignant neoplasm of unspecified part of unspecified bronchus or lung: Secondary | ICD-10-CM

## 2020-08-28 DIAGNOSIS — C3432 Malignant neoplasm of lower lobe, left bronchus or lung: Secondary | ICD-10-CM

## 2020-08-28 DIAGNOSIS — J449 Chronic obstructive pulmonary disease, unspecified: Secondary | ICD-10-CM

## 2020-08-28 DIAGNOSIS — R918 Other nonspecific abnormal finding of lung field: Secondary | ICD-10-CM | POA: Diagnosis not present

## 2020-08-28 DIAGNOSIS — R942 Abnormal results of pulmonary function studies: Secondary | ICD-10-CM

## 2020-08-28 DIAGNOSIS — C3492 Malignant neoplasm of unspecified part of left bronchus or lung: Secondary | ICD-10-CM

## 2020-08-28 DIAGNOSIS — F17200 Nicotine dependence, unspecified, uncomplicated: Secondary | ICD-10-CM | POA: Diagnosis not present

## 2020-08-28 NOTE — Progress Notes (Signed)
Synopsis: Referred in November 2021 for abnormal CT chest .  By [name], MD  Subjective:   PATIENT ID: [name] GENDER: female DOB: March 24, 1956, MRN: [id number]  Chief Complaint  Patient presents with  . Follow-up    6 week follow up     this is a 64 year old female, past medical history of depression, anxiety, current tobacco abuse.  She initially seen by her primary care provider which encouraged her to have a lung cancer screening CT.  She had this completed in October 2021.  The CT scan revealed bilateral lower lobe cystic peripheral based lesions both greater than 4 cm in size concerning for multifocal adenocarcinoma.  Patient has no respiratory symptoms at this time.  She is a current smoker.  She has smoked for 41 years at approximately 1 pack/day.  She presents today in the office with her husband and her daughter via phone.  Long discussion today regarding CT imaging as well as next appropriate steps.  Patient denies fevers chills night sweats weight loss or hemoptysis.  OV 08/28/2020: Patient here today for follow-up after recent bronchoscopy.  Patient has a primary left lower lobe adenocarcinoma and likely has a carcinoma in the left lower lobe however tissue biopsy was inconclusive on this side both are very irregular cystic shaped lesions concerning for multifocal adenocarcinoma.  PET scan was relatively equivocal with him low to moderate SUV uptake.  Patient was referred to thoracic surgery.  Case was discussed with Dr. Kipp Brood today because she was seen earlier today in the office.  She is also been referred for evaluation of an irregular shaped bladder lesion that was found on PET scan.  Pulmonary function tests have also been completed during this time and PFTs revealed a ratio of 57, a FEV1 of 1.2 L, 57% predicted evidence of air trapping with an RV of 133% and a DLCO of 41.  Today, patient is very anxious.  She met with thoracic surgery today.  Tearful today in the  office about everything that is been going on.  Also worried about the possible bladder lesion that was found on PET scan.   Past Medical History:  Diagnosis Date  . Allergy    seasonal  . Anxiety 2009  . Arthritis 2000  . Depression   . Depression 07/19/2013  . Fibroid 2003  . Organ donation 11/29/2019  . Wears glasses      Family History  Problem Relation Age of Onset  . Hypertension Mother   . Hyperlipidemia Mother   . Early death Sister 29       Drug overdose  . Dementia Father   . Cancer Father        Throat cancer   . Parkinson's disease Father   . Hypertension Daughter   . Diabetes Daughter   . Cancer Brother        Throat     Past Surgical History:  Procedure Laterality Date  . ARTHROTOMY Right 11/09/2015   Procedure: RIGHT WRIST DISTAL RADIAL ULNAR JOINT ARTHROTOMY AND DEBRIDEMENT,  AND ;  Surgeon: Iran Planas, MD;  Location: [facility];  Service: Orthopedics;  Laterality: Right;  . BRONCHIAL BIOPSY  07/28/2020   Procedure: BRONCHIAL BIOPSIES;  Surgeon: [name], DO;  Location: [facility];  Service: Pulmonary;;  . BRONCHIAL BRUSHINGS  07/28/2020   Procedure: BRONCHIAL BRUSHINGS;  Surgeon: [name], DO;  Location: [facility];  Service: Pulmonary;;  . BRONCHIAL NEEDLE ASPIRATION BIOPSY  07/28/2020   Procedure:  BRONCHIAL NEEDLE ASPIRATION BIOPSIES;  Surgeon: Josephine Igo, DO;  Location: [facility];  Service: Pulmonary;;  . BRONCHIAL WASHINGS  07/28/2020   Procedure: BRONCHIAL WASHINGS;  Surgeon: Josephine Igo, DO;  Location: [facility];  Service: Pulmonary;;  . COLONOSCOPY    . HARDWARE REMOVAL Right 07/24/2013   Procedure: RIGHT WRIST HARDWARE REMOVAL, JOINT RELEASE, RIGHT HAND MANIPULATION UNDER ANESTHESIA;  Surgeon: Sharma Covert, MD;  Location: [facility];  Service: Orthopedics;  Laterality: Right;  . NECK SURGERY  2003   Cervical vertebroplasty   . OPEN REDUCTION INTERNAL FIXATION (ORIF) DISTAL RADIAL FRACTURE Right  11/08/2012   Procedure: OPEN REDUCTION INTERNAL FIXATION (ORIF) RIGHT DISTAL RADIUS FRACTURE;  Surgeon: Sharma Covert, MD;  Location: [facility];  Service: Orthopedics;  Laterality: Right;  Marland Kitchen VIDEO BRONCHOSCOPY WITH ENDOBRONCHIAL NAVIGATION N/A 07/28/2020   Procedure: VIDEO BRONCHOSCOPY WITH ENDOBRONCHIAL NAVIGATION;  Surgeon: Josephine Igo, DO;  Location: [facility];  Service: Pulmonary;  Laterality: N/A;  . WRIST ARTHROPLASTY Right 11/09/2015   Procedure: OR DISTAL ULNAR RESECTION ARTHROPLASTY ;  Surgeon: Bradly Bienenstock, MD;  Location: [facility];  Service: Orthopedics;  Laterality: Right;  . WRIST ARTHROSCOPY WITH DEBRIDEMENT Right 07/21/2014   Procedure: RIGHT WRIST DISTAL RADIAL ULNAR JOINT DEBRIDEMENT AND JOINT RELEASE POSSIBLE TENDON INTERPOSITION;  Surgeon: Sharma Covert, MD;  Location: [facility];  Service: Orthopedics;  Laterality: Right;    Social History   Socioeconomic History  . Marital status: Married    Spouse name: Aliviana Burdell  . Number of children: 2  . Years of education: 47  . Highest education level: Not on file  Occupational History  . Occupation: Unemployed     Employer:  [employer]    Comment: Sweep   Tobacco Use  . Smoking status: Former Smoker    Packs/day: 1.00    Years: 41.00    Pack years: 41.00    Types: Cigarettes    Start date: 04/13/1975    Quit date: 07/14/2020    Years since quitting: 0.1  . Smokeless tobacco: Never Used  Vaping Use  . Vaping Use: Never used  Substance and Sexual Activity  . Alcohol use: No    Comment: Holidays only  . Drug use: No  . Sexual activity: Not Currently    Partners: Male    Birth control/protection: None, Post-menopausal  Other Topics Concern  . Not on file  Social History Narrative   Patient lives alone currently in [place].    Patient is married, they are living separately at this time.    Patient enjoys watching tv, cooking, and spending time with her children and grandchildren.    Social  Determinants of Health   Financial Resource Strain: Low Risk   . Difficulty of Paying Living Expenses: Not hard at all  Food Insecurity: No Food Insecurity  . Worried About Programme researcher, broadcasting/film/video in the Last Year: Never true  . Ran Out of Food in the Last Year: Never true  Transportation Needs: No Transportation Needs  . Lack of Transportation (Medical): No  . Lack of Transportation (Non-Medical): No  Physical Activity: Inactive  . Days of Exercise per Week: 0 days  . Minutes of Exercise per Session: 0 min  Stress: No Stress Concern Present  . Feeling of Stress : Not at all  Social Connections: Moderately Isolated  . Frequency of Communication with Friends and Family: More than three times a week  . Frequency of Social Gatherings with Friends and  Family: More than three times a week  . Attends Religious Services: Never  . Active Member of Clubs or Organizations: No  . Attends Archivist Meetings: Never  . Marital Status: Married  Human resources officer Violence: Not At Risk  . Fear of Current or Ex-Partner: No  . Emotionally Abused: No  . Physically Abused: No  . Sexually Abused: No     Allergies  Allergen Reactions  . Prednisone Other (See Comments)    'Made me feel funny inside my head"-pt cannot be specific  . Codeine Nausea And Vomiting and Other (See Comments)    Hallucinations     Outpatient Medications Prior to Visit  Medication Sig Dispense Refill  . acetaminophen (TYLENOL) 650 MG CR tablet Take 650 mg by mouth every 8 (eight) hours as needed for pain.    . Ascorbic Acid (VITAMIN C ADULT GUMMIES PO) Take 1 tablet by mouth daily.    Marland Kitchen atorvastatin (LIPITOR) 40 MG tablet Take 1 tablet (40 mg total) by mouth daily. 90 tablet 3  . azelastine (OPTIVAR) 0.05 % ophthalmic solution Place 1 drop into both eyes 2 (two) times daily as needed for allergies.    . carbamide peroxide (DEBROX) 6.5 % OTIC solution Place 5 drops into both ears 2 (two) times daily. (Patient taking  differently: Place 5 drops into both ears 2 (two) times daily as needed (ear pain).) 15 mL 0  . cetirizine (ZYRTEC) 10 MG tablet Take 1 tablet (10 mg total) by mouth daily. (Patient taking differently: Take 10 mg by mouth daily as needed for allergies.) 30 tablet 6  . Cholecalciferol (VITAMIN D) 2000 units CAPS Take 1 capsule (2,000 Units total) by mouth daily. 90 capsule 11  . clobetasol cream (TEMOVATE) 8.10 % Apply 1 application topically 2 (two) times daily as needed. (Patient taking differently: Apply 1 application topically 2 (two) times daily as needed (rash).) 30 g 2  . conjugated estrogens (PREMARIN) vaginal cream Place 1 Applicatorful vaginally daily. (Patient taking differently: Place 1 Applicatorful vaginally at bedtime.) 42.5 g 12  . cyclobenzaprine (FLEXERIL) 10 MG tablet Take 1 tablet (10 mg total) by mouth 2 (two) times daily as needed for muscle spasms. 20 tablet 0  . docusate sodium (COLACE) 100 MG capsule Take 1 capsule (100 mg total) by mouth 2 (two) times daily. 180 capsule 1  . fluticasone (FLONASE) 50 MCG/ACT nasal spray Place 2 sprays into both nostrils daily. (Patient taking differently: Place 2 sprays into both nostrils daily as needed for allergies.) 16 g 6  . gabapentin (NEURONTIN) 300 MG capsule Take 3 capsules (900 mg total) by mouth 3 (three) times daily. (Patient taking differently: Take 300-600 mg by mouth daily as needed (pain).) 270 capsule 3  . hydrOXYzine (ATARAX/VISTARIL) 50 MG tablet Take 0.5 tablets (25 mg total) by mouth at bedtime as needed. (Patient taking differently: Take 25 mg by mouth at bedtime as needed for anxiety or itching (sleep).) 60 tablet 2  . Melatonin 10 MG TBCR Take 10 mg by mouth at bedtime. 90 tablet 2  . nicotine (NICODERM CQ - DOSED IN MG/24 HR) 7 mg/24hr patch Place 1 patch (7 mg total) onto the skin daily. 28 patch 1   No facility-administered medications prior to visit.    Review of Systems  Constitutional: Negative for chills, fever,  malaise/fatigue and weight loss.  HENT: Negative for hearing loss, sore throat and tinnitus.   Eyes: Negative for blurred vision and double vision.  Respiratory: Positive for shortness  of breath. Negative for cough, hemoptysis, sputum production, wheezing and stridor.   Cardiovascular: Negative for chest pain, palpitations, orthopnea, leg swelling and PND.  Gastrointestinal: Negative for abdominal pain, constipation, diarrhea, heartburn, nausea and vomiting.  Genitourinary: Negative for dysuria, hematuria and urgency.  Musculoskeletal: Negative for joint pain and myalgias.  Skin: Negative for itching and rash.  Neurological: Negative for dizziness, tingling, weakness and headaches.  Endo/Heme/Allergies: Negative for environmental allergies. Does not bruise/bleed easily.  Psychiatric/Behavioral: Negative for depression. The patient is nervous/anxious. The patient does not have insomnia.   All other systems reviewed and are negative.    Objective:  Physical Exam Vitals reviewed.  Constitutional:      General: She is not in acute distress.    Appearance: She is well-developed.  HENT:     Head: Normocephalic and atraumatic.     Mouth/Throat:     Mouth: Oropharynx is clear and moist.     Pharynx: No oropharyngeal exudate.  Eyes:     Extraocular Movements: EOM normal.     Conjunctiva/sclera: Conjunctivae normal.     Pupils: Pupils are equal, round, and reactive to light.  Neck:     Vascular: No JVD.     Trachea: No tracheal deviation.     Comments: Loss of supraclavicular fat Cardiovascular:     Rate and Rhythm: Normal rate and regular rhythm.     Pulses: Intact distal pulses.     Heart sounds: S1 normal and S2 normal.     Comments: Distant heart tones Pulmonary:     Effort: No tachypnea or accessory muscle usage.     Breath sounds: No stridor. Decreased breath sounds (throughout all lung fields) present. No wheezing, rhonchi or rales.     Comments: Increased AP chest  diameter Abdominal:     General: Bowel sounds are normal. There is no distension.     Palpations: Abdomen is soft.     Tenderness: There is no abdominal tenderness.  Musculoskeletal:        General: Deformity (muscle wasting ) present. No edema.  Skin:    General: Skin is warm and dry.     Capillary Refill: Capillary refill takes less than 2 seconds.     Findings: No rash.  Neurological:     Mental Status: She is alert and oriented to person, place, and time.  Psychiatric:        Mood and Affect: Mood and affect normal.        Behavior: Behavior normal.      Vitals:   08/28/20 1633  BP: 122/70  Pulse: 69  Temp: 98.4 F (36.9 C)  TempSrc: Tympanic  SpO2: 95%  Weight: 174 lb 6 oz (79.1 kg)  Height: $Remove'5\' 8"'zAhAFoo$  (1.727 m)   95% on RA BMI Readings from Last 3 Encounters:  08/28/20 26.51 kg/m  08/28/20 25.70 kg/m  08/18/20 25.89 kg/m   Wt Readings from Last 3 Encounters:  08/28/20 174 lb 6 oz (79.1 kg)  08/28/20 174 lb (78.9 kg)  08/18/20 175 lb 4.8 oz (79.5 kg)     CBC    Component Value Date/Time   WBC 8.9 08/18/2020 1348   WBC 8.6 07/28/2020 1048   RBC 4.83 08/18/2020 1348   HGB 13.1 08/18/2020 1348   HGB 14.1 06/23/2020 1237   HCT 41.6 08/18/2020 1348   HCT 43.6 06/23/2020 1237   PLT 360 08/18/2020 1348   PLT 366 06/23/2020 1237   MCV 86.1 08/18/2020 1348   MCV 86 06/23/2020 1237  MCH 27.1 08/18/2020 1348   MCHC 31.5 08/18/2020 1348   RDW 13.7 08/18/2020 1348   RDW 13.2 06/23/2020 1237   LYMPHSABS 2.3 08/18/2020 1348   LYMPHSABS 2.2 06/23/2020 1237   MONOABS 0.7 08/18/2020 1348   EOSABS 0.2 08/18/2020 1348   EOSABS 0.2 06/23/2020 1237   BASOSABS 0.1 08/18/2020 1348   BASOSABS 0.1 06/23/2020 1237     Chest Imaging:  Lung cancer screening CT October 2021: Bilateral cystic appearing masses concerning for multifocal adenocarcinoma. The patient's images have been independently reviewed by me.    Pulmonary Functions Testing Results: PFT Results  Latest Ref Rng & Units 08/03/2020  FVC-Pre L 2.03  FVC-Predicted Pre % 67  FVC-Post L 2.15  FVC-Predicted Post % 71  Pre FEV1/FVC % % 67  Post FEV1/FCV % % 57  FEV1-Pre L 1.37  FEV1-Predicted Pre % 58  FEV1-Post L 1.22  DLCO uncorrected ml/min/mmHg 9.46  DLCO UNC% % 41  DLCO corrected ml/min/mmHg 9.26  DLCO COR %Predicted % 40  DLVA Predicted % 57  TLC L 5.13  TLC % Predicted % 90  RV % Predicted % 133    FeNO:   Pathology:   Echocardiogram:   Heart Catheterization:     Assessment & Plan:     ICD-10-CM   1. Adenocarcinoma, lung, left ([facility])  C34.92 Ambulatory referral to Radiation Oncology  2. Mass of lower lobe of left lung  R91.8   3. Mass of lower lobe of right lung  R91.8   4. Malignant neoplasm of unspecified part of unspecified bronchus or lung ([facility])  C34.90   5. Current smoker  F17.200   6. Centrilobular emphysema ([place])  J43.2   7. Chronic obstructive pulmonary disease, unspecified COPD type ([place])  J44.9   8. Decreased diffusion capacity  R94.2   9. Stage 3 severe COPD by GOLD classification ([place])  J44.9     Discussion:  64 year old female, PFTs with severe COPD, severely reduced DLCO.  She has centrilobular emphysema and new diagnosis of left lower lobe adenocarcinoma and likely has a cancer also adenocarcinoma which was a nondiagnostic side of the bronchoscopy on the right.  Both are irregular cystic shaped lesions consistent with multi focal adenocarcinoma.  Large portion of today's office visit was discussing with patient, patient's husband as well as their daughter via phone about next best steps.  Plan: Due to the patient's lung function I don't think that she would tolerate very well having surgery. Patient is also very hesitant about having a big procedure like this. She did meet with thoracic surgery today to discuss. I think the next best step would be consideration for radiation therapy at least to the left lower lobe adenocarcinoma and potentially  watching the right side or considerations for empiric treatment to the right as well. Either way she will need surveillance imaging. As for the bladder lesion plan for follow-up/new consultation with urology.  Fortunately the patient has quit smoking.  Smoking cessation was discussed with patient today.  She would also likely benefit from a daily maintenance inhaler.  Not interested at this time.  Patient return to see [place] in approximately 6 months or as needed.   Current Outpatient Medications:  .  acetaminophen (TYLENOL) 650 MG CR tablet, Take 650 mg by mouth every 8 (eight) hours as needed for pain., Disp: , Rfl:  .  Ascorbic Acid (VITAMIN C ADULT GUMMIES PO), Take 1 tablet by mouth daily., Disp: , Rfl:  .  atorvastatin (LIPITOR) 40  MG tablet, Take 1 tablet (40 mg total) by mouth daily., Disp: 90 tablet, Rfl: 3 .  azelastine (OPTIVAR) 0.05 % ophthalmic solution, Place 1 drop into both eyes 2 (two) times daily as needed for allergies., Disp: , Rfl:  .  carbamide peroxide (DEBROX) 6.5 % OTIC solution, Place 5 drops into both ears 2 (two) times daily. (Patient taking differently: Place 5 drops into both ears 2 (two) times daily as needed (ear pain).), Disp: 15 mL, Rfl: 0 .  cetirizine (ZYRTEC) 10 MG tablet, Take 1 tablet (10 mg total) by mouth daily. (Patient taking differently: Take 10 mg by mouth daily as needed for allergies.), Disp: 30 tablet, Rfl: 6 .  Cholecalciferol (VITAMIN D) 2000 units CAPS, Take 1 capsule (2,000 Units total) by mouth daily., Disp: 90 capsule, Rfl: 11 .  clobetasol cream (TEMOVATE) 6.65 %, Apply 1 application topically 2 (two) times daily as needed. (Patient taking differently: Apply 1 application topically 2 (two) times daily as needed (rash).), Disp: 30 g, Rfl: 2 .  conjugated estrogens (PREMARIN) vaginal cream, Place 1 Applicatorful vaginally daily. (Patient taking differently: Place 1 Applicatorful vaginally at bedtime.), Disp: 42.5 g, Rfl: 12 .  cyclobenzaprine  (FLEXERIL) 10 MG tablet, Take 1 tablet (10 mg total) by mouth 2 (two) times daily as needed for muscle spasms., Disp: 20 tablet, Rfl: 0 .  docusate sodium (COLACE) 100 MG capsule, Take 1 capsule (100 mg total) by mouth 2 (two) times daily., Disp: 180 capsule, Rfl: 1 .  fluticasone (FLONASE) 50 MCG/ACT nasal spray, Place 2 sprays into both nostrils daily. (Patient taking differently: Place 2 sprays into both nostrils daily as needed for allergies.), Disp: 16 g, Rfl: 6 .  gabapentin (NEURONTIN) 300 MG capsule, Take 3 capsules (900 mg total) by mouth 3 (three) times daily. (Patient taking differently: Take 300-600 mg by mouth daily as needed (pain).), Disp: 270 capsule, Rfl: 3 .  hydrOXYzine (ATARAX/VISTARIL) 50 MG tablet, Take 0.5 tablets (25 mg total) by mouth at bedtime as needed. (Patient taking differently: Take 25 mg by mouth at bedtime as needed for anxiety or itching (sleep).), Disp: 60 tablet, Rfl: 2 .  Melatonin 10 MG TBCR, Take 10 mg by mouth at bedtime., Disp: 90 tablet, Rfl: 2 .  nicotine (NICODERM CQ - DOSED IN MG/24 HR) 7 mg/24hr patch, Place 1 patch (7 mg total) onto the skin daily., Disp: 28 patch, Rfl: 1  I spent 45 minutes dedicated to the care of this patient on the date of this encounter to include pre-visit review of records, face-to-face time with the patient discussing conditions above, post visit ordering of testing, clinical documentation with the electronic health record, making appropriate referrals as documented, and communicating necessary findings to members of the patients care team.    [name], [facility] 08/28/2020 5:02 PM

## 2020-08-28 NOTE — Progress Notes (Signed)
[address]       [place] [address]             [phone]                    [name]  Medical Record #[id number] Date of Birth: 1956-01-14  Referring: Curt Bears, MD Primary Care: [name], MD Primary Cardiologist: No primary care provider on file.  Chief Complaint:    Chief Complaint  Patient presents with  . Lung Cancer    Initial surgical consult PET 12/6, bronch 11/16, CT super D 11/12    History of Present Illness:    [name] 64 y.o. female presents for surgical evaluation of bilateral lower lobe cystic lesions.  She underwent a navigational bronchoscopy with [name] adenocarcinoma to the left lower lobe lesion, and nondiagnostic biopsy of the right lower lobe lesion.  Additionally she has avidity in her bladder concerning for transitional cell tumor.  There was no avidity noted on the hilar and mediastinal nodes.  In regards to her symptoms she does admit to some exertional dyspnea, and excess fatigue.  She states that she is not able to walk up a flight or 2 of steps without experiencing some dyspnea.  She also states that she spends about 3 days a week sitting around watching TV or laying in bed due to fatigue.  Today in clinic she also stated that she had a headache.  Her weight has been stable.  She is very anxious about her treatment plan.  She recently stopped smoking about a month ago.    Zubrod Score: At the time of surgery this patient's most appropriate activity status/level should be described as: []     0    Normal activity, no symptoms [x]     1    Restricted in physical strenuous activity but ambulatory, able to do out light work []     2    Ambulatory and capable of self care, unable to do work activities, up and about               >50 % of waking hours                              []     3    Only limited self care, in bed greater than 50% of waking hours []     4    Completely disabled, no self  care, confined to bed or chair []     5    Moribund   Past Medical History:  Diagnosis Date  . Allergy    seasonal  . Anxiety 2009  . Arthritis 2000  . Depression   . Depression 07/19/2013  . Fibroid 2003  . Organ donation 11/29/2019  . Wears glasses     Past Surgical History:  Procedure Laterality Date  . ARTHROTOMY Right 11/09/2015   Procedure: RIGHT WRIST DISTAL RADIAL ULNAR JOINT ARTHROTOMY AND DEBRIDEMENT,  AND ;  Surgeon: Iran Planas, MD;  Location: [facility];  Service: Orthopedics;  Laterality: Right;  . BRONCHIAL BIOPSY  07/28/2020   Procedure: BRONCHIAL BIOPSIES;  Surgeon: [name], DO;  Location: [facility];  Service: Pulmonary;;  . BRONCHIAL BRUSHINGS  07/28/2020   Procedure: BRONCHIAL BRUSHINGS;  Surgeon: [name], DO;  Location: [facility];  Service: Pulmonary;;  . BRONCHIAL NEEDLE ASPIRATION BIOPSY  07/28/2020  Procedure: BRONCHIAL NEEDLE ASPIRATION BIOPSIES;  Surgeon: [name], DO;  Location: [facility];  Service: Pulmonary;;  . BRONCHIAL WASHINGS  07/28/2020   Procedure: BRONCHIAL WASHINGS;  Surgeon: [name], DO;  Location: [facility];  Service: Pulmonary;;  . COLONOSCOPY    . HARDWARE REMOVAL Right 07/24/2013   Procedure: RIGHT WRIST HARDWARE REMOVAL, JOINT RELEASE, RIGHT HAND MANIPULATION UNDER ANESTHESIA;  Surgeon: Linna Hoff, MD;  Location: [facility];  Service: Orthopedics;  Laterality: Right;  . NECK SURGERY  2003   Cervical vertebroplasty   . OPEN REDUCTION INTERNAL FIXATION (ORIF) DISTAL RADIAL FRACTURE Right 11/08/2012   Procedure: OPEN REDUCTION INTERNAL FIXATION (ORIF) RIGHT DISTAL RADIUS FRACTURE;  Surgeon: Linna Hoff, MD;  Location: [facility];  Service: Orthopedics;  Laterality: Right;  Marland Kitchen VIDEO BRONCHOSCOPY WITH ENDOBRONCHIAL NAVIGATION N/A 07/28/2020   Procedure: VIDEO BRONCHOSCOPY WITH ENDOBRONCHIAL NAVIGATION;  Surgeon: [name], DO;  Location: [facility];  Service:  Pulmonary;  Laterality: N/A;  . WRIST ARTHROPLASTY Right 11/09/2015   Procedure: OR DISTAL ULNAR RESECTION ARTHROPLASTY ;  Surgeon: Iran Planas, MD;  Location: [facility];  Service: Orthopedics;  Laterality: Right;  . WRIST ARTHROSCOPY WITH DEBRIDEMENT Right 07/21/2014   Procedure: RIGHT WRIST DISTAL RADIAL ULNAR JOINT DEBRIDEMENT AND JOINT RELEASE POSSIBLE TENDON INTERPOSITION;  Surgeon: Linna Hoff, MD;  Location: [facility];  Service: Orthopedics;  Laterality: Right;    Family History  Problem Relation Age of Onset  . Hypertension Mother   . Hyperlipidemia Mother   . Early death Sister 95       Drug overdose  . Dementia Father   . Cancer Father        Throat cancer   . Parkinson's disease Father   . Hypertension Daughter   . Diabetes Daughter   . Cancer Brother        Throat     Social History   Tobacco Use  Smoking Status Former Smoker  . Packs/day: 1.00  . Years: 41.00  . Pack years: 41.00  . Types: Cigarettes  . Start date: 04/13/1975  . Quit date: 07/14/2020  . Years since quitting: 0.1  Smokeless Tobacco Never Used    Social History   Substance and Sexual Activity  Alcohol Use No   Comment: Holidays only     Allergies  Allergen Reactions  . Prednisone Other (See Comments)    'Made me feel funny inside my head"-pt cannot be specific  . Codeine Nausea And Vomiting and Other (See Comments)    Hallucinations    Current Outpatient Medications  Medication Sig Dispense Refill  . acetaminophen (TYLENOL) 650 MG CR tablet Take 650 mg by mouth every 8 (eight) hours as needed for pain.    . Ascorbic Acid (VITAMIN C ADULT GUMMIES PO) Take 1 tablet by mouth daily.    Marland Kitchen atorvastatin (LIPITOR) 40 MG tablet Take 1 tablet (40 mg total) by mouth daily. 90 tablet 3  . azelastine (OPTIVAR) 0.05 % ophthalmic solution Place 1 drop into both eyes 2 (two) times daily as needed for allergies.    . carbamide peroxide (DEBROX) 6.5 % OTIC solution Place 5 drops into both ears 2 (two) times  daily. (Patient taking differently: Place 5 drops into both ears 2 (two) times daily as needed (ear pain).) 15 mL 0  . cetirizine (ZYRTEC) 10 MG tablet Take 1 tablet (10 mg total) by mouth daily. (Patient taking differently: Take 10 mg by mouth daily as needed for  allergies.) 30 tablet 6  . Cholecalciferol (VITAMIN D) 2000 units CAPS Take 1 capsule (2,000 Units total) by mouth daily. 90 capsule 11  . clobetasol cream (TEMOVATE) 1.30 % Apply 1 application topically 2 (two) times daily as needed. (Patient taking differently: Apply 1 application topically 2 (two) times daily as needed (rash).) 30 g 2  . conjugated estrogens (PREMARIN) vaginal cream Place 1 Applicatorful vaginally daily. (Patient taking differently: Place 1 Applicatorful vaginally at bedtime.) 42.5 g 12  . cyclobenzaprine (FLEXERIL) 10 MG tablet Take 1 tablet (10 mg total) by mouth 2 (two) times daily as needed for muscle spasms. 20 tablet 0  . docusate sodium (COLACE) 100 MG capsule Take 1 capsule (100 mg total) by mouth 2 (two) times daily. 180 capsule 1  . fluticasone (FLONASE) 50 MCG/ACT nasal spray Place 2 sprays into both nostrils daily. (Patient taking differently: Place 2 sprays into both nostrils daily as needed for allergies.) 16 g 6  . gabapentin (NEURONTIN) 300 MG capsule Take 3 capsules (900 mg total) by mouth 3 (three) times daily. (Patient taking differently: Take 300-600 mg by mouth daily as needed (pain).) 270 capsule 3  . hydrOXYzine (ATARAX/VISTARIL) 50 MG tablet Take 0.5 tablets (25 mg total) by mouth at bedtime as needed. (Patient taking differently: Take 25 mg by mouth at bedtime as needed for anxiety or itching (sleep).) 60 tablet 2  . Melatonin 10 MG TBCR Take 10 mg by mouth at bedtime. 90 tablet 2  . nicotine (NICODERM CQ - DOSED IN MG/24 HR) 7 mg/24hr patch Place 1 patch (7 mg total) onto the skin daily. 28 patch 1   No current facility-administered medications for this visit.    Review of Systems   Constitutional: Positive for malaise/fatigue. Negative for weight loss.  Respiratory: Positive for cough and shortness of breath.   Cardiovascular: Negative.   Musculoskeletal: Negative.   Neurological: Positive for headaches.     PHYSICAL EXAMINATION: BP 127/82 (BP Location: Left Arm, Patient Position: Sitting)   Pulse (!) 102   Resp 20   Ht 5\' 9"  (1.753 m)   Wt 174 lb (78.9 kg)   SpO2 90% Comment: RA with mask on  BMI 25.70 kg/m  Physical Exam Constitutional:      General: She is not in acute distress.    Appearance: Normal appearance. She is normal weight. She is not ill-appearing.  Eyes:     Extraocular Movements: Extraocular movements intact.  Cardiovascular:     Rate and Rhythm: Tachycardia present.  Pulmonary:     Effort: Pulmonary effort is normal. No respiratory distress.  Musculoskeletal:        General: Normal range of motion.  Skin:    General: Skin is warm and dry.  Neurological:     General: No focal deficit present.     Mental Status: She is alert and oriented to person, place, and time.     Diagnostic Studies & Laboratory data:     Recent Radiology Findings:   NM PET Image Initial (PI) Skull Base To Thigh  Result Date: 08/17/2020 CLINICAL DATA:  Initial treatment strategy for lung masses. EXAM: NUCLEAR MEDICINE PET SKULL BASE TO THIGH TECHNIQUE: 8.8 mCi F-18 FDG was injected intravenously. Full-ring PET imaging was performed from the skull base to thigh after the radiotracer. CT data was obtained and used for attenuation correction and anatomic localization. Fasting blood glucose: 92 mg/dl COMPARISON:  Chest CT 07/24/2020 FINDINGS: Mediastinal blood pool activity: SUV max 2.5 Liver activity: SUV max  NA NECK: Symmetric physiologic activity in the palatine tonsils. No hypermetabolic adenopathy identified. Incidental CT findings: none CHEST: The slightly nodular region of architectural distortion measuring about 0.4 cm in thickness on image 54 of series 4 has  a maximum SUV of 0.9. The sub solid mass in the superior segment left lower lobe measuring about 4.6 by 2.3 cm on image 68 of series 4 has a maximum SUV of 3.0, suspicious for low-grade malignancy. The irregular primarily cystic lesion posteriorly in the right lower lobe measuring 5.8 by 3.3 cm with marginal irregular thickening has a maximum SUV of 2.4, and morphologically is also concerning for the possibility of low-grade malignancy. The previously seen nodularity adjacent to the major fissure in the right lower lobe is shown today on image 80 of series 4, the individual nodules are below PET-CT size sensitivity and not obviously hypermetabolic. Incidental CT findings: Severe emphysema. Aberrant right subclavian artery passes behind the esophagus. Atherosclerotic calcification of the aortic arch. A left subpectoral node measuring 0.7 cm in short axis on image 55 of series 4 has a fatty hilum and low-grade associated metabolic activity with maximum SUV of 2.2, which is less than blood pool hence probably benign. ABDOMEN/PELVIS: Although not measurable in metabolic activity due to highly concentrated FDG in the adjacent urine, there is a 2.0 by 1.6 cm soft tissue density lesion along the lumen of the right posterior urinary bladder on image 176 of series 4 suspicious for transitional cell carcinoma. A blood clot or fungus ball might have a similar appearance although urothelial tumor is favored. Incidental CT findings: Mild right hydronephrosis without hydroureter, query right UPJ stenosis. Right kidney upper pole photopenic cyst. Aortoiliac atherosclerotic vascular disease. SKELETON: No significant abnormal hypermetabolic activity in this region. Incidental CT findings: Cervical plate and screw fixator. IMPRESSION: 1. 2.0 by 1.6 cm intraluminal soft tissue density along the right posterior urinary bladder is suspicious for a urothelial tumor. Cystoscopy is recommended. 2. Sub solid left lower lobe mass and sub  solid right lower lobe mass both have low-grade metabolic activity suspicious for low-grade malignancy. 3. Other imaging findings of potential clinical significance: Aortic Atherosclerosis (ICD10-I70.0) and Emphysema (ICD10-J43.9). Average right subclavian artery. Mild right hydronephrosis but without hydroureter, query right UPJ stenosis. Electronically Signed   By: Van Clines M.D.   On: 08/17/2020 14:07       I have independently reviewed the above radiology studies  and reviewed the findings with the patient.   Recent Lab Findings: Lab Results  Component Value Date   WBC 8.9 08/18/2020   HGB 13.1 08/18/2020   HCT 41.6 08/18/2020   PLT 360 08/18/2020   GLUCOSE 80 08/18/2020   CHOL 125 03/26/2019   TRIG 91 03/26/2019   HDL 49 03/26/2019   LDLDIRECT 157 (H) 03/30/2012   LDLCALC 58 03/26/2019   ALT 37 08/18/2020   AST 25 08/18/2020   NA 142 08/18/2020   K 4.5 08/18/2020   CL 105 08/18/2020   CREATININE 1.05 (H) 08/18/2020   BUN 17 08/18/2020   CO2 31 08/18/2020   TSH 3.240 06/23/2020   INR 1.01 07/19/2013   HGBA1C 5.9 (H) 06/23/2020     PFTs: - FVC: 67% - FEV1: 67% -DLCO: 40%  Problem List: 4.6 cm left lower lobe adenocarcinoma 5.8 right lower lobe cystic lesion with SUV of 2.4 concerning for primary lung cancer Marginal pulmonary function with a DLCO 40% 2 cm soft tissue nodule in the right posterior bladder concerning for urothelial tumor.  Assessment / Plan:   64 year old female with a 4.6 cm left lower lobe adenocarcinoma, and a 5.8 cm right lower lobe cystic lesion concerning for primary lung cancer as well.  She recently stopped smoking and her lung functions are marginal at best.  Additionally she has poor functional status and that she spends many days out of the week recumbent due to low energy.  Given the size of her lesions, and history of intermittent headaches I think that she will require an MRI of the brain for further evaluation.  Despite not  having any avid lymphadenopathy on the PET scan, this should also be evaluated with EBUS.    From a functional standpoint, I do not think that she is a good operative candidate.  The lower lobe lesions appear very similar, and despite not having a positive tissue sample of the right lower lobe I do think that this is likely a synchronous primary lung cancer.  Given her lung function she likely would not tolerate single lobectomy let alone bilateral lobectomies without being on lifelong oxygen.  I have given her the option of undergoing a left lower lobectomy robotically with radiation therapy to the right lobe versus radiation therapy to bilateral lobes.  She has of yet is not ready to make a decision and will contact us with her ultimate choice.  I again reiterated to her that with surgery the likelihood of her being on lifelong oxygen is quite high thus not making her the ideal surgical candidate.  I  spent 60 minutes with  the patient face to face and greater then 50% of the time was spent in counseling and coordination of care.    Lajuana Matte 08/28/2020 4:16 PM

## 2020-08-28 NOTE — Patient Instructions (Signed)
Thank you for visiting [name] at [facility]. Today we recommend the following:  Orders Placed This Encounter  Procedures   Ambulatory referral to Radiation Oncology    Return in about 6 months (around 02/26/2021) for with APP or [name].    Please do your part to reduce the spread of COVID-19.

## 2020-09-01 DIAGNOSIS — N130 Hydronephrosis with ureteropelvic junction obstruction: Secondary | ICD-10-CM | POA: Diagnosis not present

## 2020-09-01 DIAGNOSIS — R8271 Bacteriuria: Secondary | ICD-10-CM | POA: Diagnosis not present

## 2020-09-01 DIAGNOSIS — C678 Malignant neoplasm of overlapping sites of bladder: Secondary | ICD-10-CM | POA: Diagnosis not present

## 2020-09-03 ENCOUNTER — Other Ambulatory Visit: Payer: Self-pay | Admitting: Urology

## 2020-09-09 NOTE — Patient Instructions (Addendum)
DUE TO COVID-19 ONLY ONE VISITOR IS ALLOWED TO COME WITH YOU AND STAY IN THE WAITING ROOM ONLY DURING PRE OP AND PROCEDURE DAY OF SURGERY. THE 1 VISITOR  MAY VISIT WITH YOU AFTER SURGERY IN YOUR PRIVATE ROOM DURING VISITING HOURS ONLY!  YOU NEED TO HAVE A COVID 19 TEST ON  1/4_____ @_2 :05______, THIS TEST MUST BE DONE BEFORE SURGERY,  COVID TESTING SITE [address] [place] [address], IT IS ON THE RIGHT GOING OUT [address] APPROXIMATELY  2 MINUTES PAST [employer] ON THE RIGHT. ONCE YOUR COVID TEST IS COMPLETED,  PLEASE BEGIN THE QUARANTINE INSTRUCTIONS AS OUTLINED IN YOUR HANDOUT.                [place]    Your procedure is scheduled on: 09/18/20   Report to [facility] Main  Entrance   Report to admitting at   12:00 pm     Call this number if you have problems the morning of surgery [phone]    Remember: Do not eat food after Midnight. You may have clear liquid until 12:00 PM    CLEAR LIQUID DIET   Foods Allowed                                                                     Foods Excluded  Coffee and tea, regular and decaf                             liquids that you cannot  Plain Jell-O any favor except red or purple                                           see through such as: Fruit ices (not with fruit pulp)                                     milk, soups, orange juice  Iced Popsicles                                    All solid food Carbonated beverages, regular and diet                                    Cranberry, grape and apple juices Sports drinks like Gatorade Lightly seasoned clear broth or consume(fat free) Sugar, honey syrup     . BRUSH YOUR TEETH MORNING OF SURGERY AND RINSE YOUR MOUTH OUT, NO CHEWING GUM CANDY OR MINTS.     Take these medicines the morning of surgery with A SIP OF WATER: Gabapentin, Venlafaxine, use your eye drops as usual                                 You may not have any metal on  your body including hair pins and              piercings  Do not wear jewelry, make-up, lotions, powders or perfumes, deodorant             Do not wear nail polish on your fingernails.  Do not shave  48 hours prior to surgery.                Do not bring valuables to the hospital. [place].  Contacts, dentures or bridgework may not be worn into surgery.       Patients discharged the day of surgery will not be allowed to drive home.   IF YOU ARE HAVING SURGERY AND GOING HOME THE SAME DAY, YOU MUST HAVE AN ADULT TO DRIVE YOU HOME AND BE WITH YOU FOR 24 HOURS.   YOU MAY GO HOME BY TAXI OR UBER OR ORTHERWISE, BUT AN ADULT MUST ACCOMPANY YOU HOME AND STAY WITH YOU FOR 24 HOURS.  Name and phone number of your driver:  Special Instructions: N/A              Please read over the following fact sheets you were given: _____________________________________________________________________             [facility] - Preparing for Surgery Before surgery, you can play an important role.   Because skin is not sterile, your skin needs to be as free of germs as possible.   You can reduce the number of germs on your skin by washing with CHG (chlorahexidine gluconate) soap before surgery.   CHG is an antiseptic cleaner which kills germs and bonds with the skin to continue killing germs even after washing. Please DO NOT use if you have an allergy to CHG or antibacterial soaps.   If your skin becomes reddened/irritated stop using the CHG and inform your nurse when you arrive at Short Stay. Do not shave (including legs and underarms) for at least 48 hours prior to the first CHG shower.    Please follow these instructions carefully:  1.  Shower with CHG Soap the night before surgery and the  morning of Surgery.  2.  If you choose to wash your hair, wash your hair first as usual with your  normal  shampoo.  3.  After you shampoo, rinse your hair and body  thoroughly to remove the  shampoo.                                        4.  Use CHG as you would any other liquid soap.  You can apply chg directly  to the skin and wash                       Gently with a scrungie or clean washcloth.  5.  Apply the CHG Soap to your body ONLY FROM THE NECK DOWN.   Do not use on face/ open                           Wound or open sores. Avoid contact with eyes, ears mouth and genitals (private parts).  Wash face,  Genitals (private parts) with your normal soap.             6.  Wash thoroughly, paying special attention to the area where your surgery  will be performed.  7.  Thoroughly rinse your body with warm water from the neck down.  8.  DO NOT shower/wash with your normal soap after using and rinsing off  the CHG Soap.             9.  Pat yourself dry with a clean towel.            10.  Wear clean pajamas.            11.  Place clean sheets on your bed the night of your first shower and do not  sleep with pets. Day of Surgery : Do not apply any lotions/deodorants the morning of surgery.  Please wear clean clothes to the hospital/surgery center.  FAILURE TO FOLLOW THESE INSTRUCTIONS MAY RESULT IN THE CANCELLATION OF YOUR SURGERY PATIENT SIGNATURE_________________________________  NURSE SIGNATURE__________________________________  ________________________________________________________________________

## 2020-09-10 ENCOUNTER — Other Ambulatory Visit: Payer: Self-pay

## 2020-09-10 ENCOUNTER — Encounter (HOSPITAL_COMMUNITY)
Admission: RE | Admit: 2020-09-10 | Discharge: 2020-09-10 | Disposition: A | Discharge status: Home / Self Care | Payer: Medicare Other | Source: Ambulatory Visit | Attending: Urology | Admitting: Urology

## 2020-09-10 ENCOUNTER — Encounter (HOSPITAL_COMMUNITY): Payer: Self-pay

## 2020-09-10 DIAGNOSIS — Z01812 Encounter for preprocedural laboratory examination: Secondary | ICD-10-CM | POA: Insufficient documentation

## 2020-09-10 LAB — CBC
HCT: 41.3 % (ref 36.0–46.0)
Hemoglobin: 12.9 g/dL (ref 12.0–15.0)
MCH: 28 pg (ref 26.0–34.0)
MCHC: 31.2 g/dL (ref 30.0–36.0)
MCV: 89.6 fL (ref 80.0–100.0)
Platelets: 359 10*3/uL (ref 150–400)
RBC: 4.61 MIL/uL (ref 3.87–5.11)
RDW: 14.1 % (ref 11.5–15.5)
WBC: 8.2 10*3/uL (ref 4.0–10.5)
nRBC: 0 % (ref 0.0–0.2)

## 2020-09-10 NOTE — Progress Notes (Signed)
COVID Vaccine Completed:Yes Date COVID Vaccine completed:12/30/19- Booster 06/22/20 COVID vaccine manufacturer: Pfizer     PCP - Dr. Deveron Furlong Cardiologist - no  Chest x-ray - no EKG - no Stress Test - no ECHO - no Cardiac Cath - no Pacemaker/ICD device last checked:NA  Sleep Study - no CPAP -   Fasting Blood Sugar -NA  Checks Blood Sugar _____ times a day  Blood Thinner Instructions:NA Aspirin Instructions: Last Dose:  Anesthesia review:   Patient denies shortness of breath, fever, cough and chest pain at PAT appointment  yes Patient verbalized understanding of instructions that were given to them at the PAT appointment. Patient was also instructed that they will need to review over the PAT instructions again at home before surgery. Yes Pt reports that she can walk all over the mall , do housework and ADLs without SOB

## 2020-09-12 DIAGNOSIS — C679 Malignant neoplasm of bladder, unspecified: Secondary | ICD-10-CM

## 2020-09-12 HISTORY — DX: Malignant neoplasm of bladder, unspecified: C67.9

## 2020-09-15 ENCOUNTER — Other Ambulatory Visit (HOSPITAL_COMMUNITY)
Admission: RE | Admit: 2020-09-15 | Discharge: 2020-09-15 | Disposition: A | Discharge status: Home / Self Care | Payer: Medicare Other | Source: Ambulatory Visit | Attending: Urology | Admitting: Urology

## 2020-09-15 DIAGNOSIS — Z20822 Contact with and (suspected) exposure to covid-19: Secondary | ICD-10-CM | POA: Insufficient documentation

## 2020-09-15 DIAGNOSIS — Z01812 Encounter for preprocedural laboratory examination: Secondary | ICD-10-CM | POA: Diagnosis not present

## 2020-09-16 ENCOUNTER — Encounter: Payer: Self-pay | Admitting: Radiation Oncology

## 2020-09-16 ENCOUNTER — Ambulatory Visit
Admission: RE | Admit: 2020-09-16 | Discharge: 2020-09-16 | Disposition: A | Discharge status: Home / Self Care | Payer: Medicare Other | Source: Ambulatory Visit | Attending: Radiation Oncology | Admitting: Radiation Oncology

## 2020-09-16 ENCOUNTER — Other Ambulatory Visit: Payer: Self-pay

## 2020-09-16 DIAGNOSIS — M129 Arthropathy, unspecified: Secondary | ICD-10-CM | POA: Insufficient documentation

## 2020-09-16 DIAGNOSIS — Z87891 Personal history of nicotine dependence: Secondary | ICD-10-CM | POA: Insufficient documentation

## 2020-09-16 DIAGNOSIS — Z79899 Other long term (current) drug therapy: Secondary | ICD-10-CM | POA: Diagnosis not present

## 2020-09-16 DIAGNOSIS — C3432 Malignant neoplasm of lower lobe, left bronchus or lung: Secondary | ICD-10-CM | POA: Insufficient documentation

## 2020-09-16 DIAGNOSIS — Z801 Family history of malignant neoplasm of trachea, bronchus and lung: Secondary | ICD-10-CM | POA: Diagnosis not present

## 2020-09-16 DIAGNOSIS — F418 Other specified anxiety disorders: Secondary | ICD-10-CM | POA: Diagnosis not present

## 2020-09-16 DIAGNOSIS — J984 Other disorders of lung: Secondary | ICD-10-CM | POA: Insufficient documentation

## 2020-09-16 DIAGNOSIS — Z808 Family history of malignant neoplasm of other organs or systems: Secondary | ICD-10-CM | POA: Diagnosis not present

## 2020-09-16 LAB — SARS CORONAVIRUS 2 (TAT 6-24 HRS): SARS Coronavirus 2: NEGATIVE

## 2020-09-16 NOTE — Progress Notes (Addendum)
Patient here for a consult today with Dr. Sondra Come.  Thoracic Location of Tumor / Histology: Posterior left lower lobe of lung  Patient presented to primary MD on 07/08/2020 and had chest CT due to long hx of smoking  Biopsies of left lower lobe: Adenocarcinoma  Tobacco/Marijuana/Snuff/ETOH use:  1 PPD for 41 years.  Past/Anticipated interventions by cardiothoracic surgery, if any: Having a Cystoscopy on 1/7 that patient states is related to her lung cancer.  Past/Anticipated interventions by medical oncology, if any: unknown at present.  Signs/Symptoms  Weight changes, if any: no change  Respiratory complaints, if any: Has shortness of breath "when rushing."  Hemoptysis, if any: yes in the last year  Pain issues, if any:  no  SAFETY ISSUES:  Prior radiation? no  Pacemaker/ICD?  no  Possible current pregnancy? Postmenopausal  Is the patient on methotrexate? no  Current Complaints / other details:  very  anxious about her dx, Distress level 10 out of 10 and declines a social work referral.  BP 130/82 (BP Location: Left Arm, Patient Position: Sitting, Cuff Size: Normal)   Pulse 87   Temp 97.6 F (36.4 C)   Resp 18   Ht 5\' 8"  (1.727 m)   Wt 177 lb 12.8 oz (80.6 kg)   SpO2 95%   BMI 27.03 kg/m    Wt Readings from Last 3 Encounters:  09/16/20 177 lb 12.8 oz (80.6 kg)  09/10/20 178 lb 1 oz (80.8 kg)  08/28/20 174 lb 6 oz (79.1 kg)

## 2020-09-16 NOTE — Progress Notes (Signed)
Radiation Oncology         ([phone]) [phone] ________________________________  Initial Outpatient Consultation  Name: [name] MRN: [id number]  Date: 09/16/2020  DOB: [birth year]  WU:XLKG, Barnetta Chapel, MD  [name], DO   REFERRING PHYSICIAN: [name], DO  DIAGNOSIS: The encounter diagnosis was Primary adenocarcinoma of lower lobe of left lung ([place]).  Stage IIA (T2b, N0, M0) non-small cell lung cancer, adenocarcinoma of the left lower lobe with suspicious right lower lobe cystic lesion  HISTORY OF PRESENT ILLNESS::[name] is a 65 y.o. female who is seen as a courtesy of [name] for an opinion concerning radiation therapy as part of management for her recently diagnosed lung cancer. . The patient underwent a lung cancer screening chest CT scan on 07/07/2020, which showed dominant irregular solid and sub-solid pulmonary lesions in the lower lobes bilaterally, concerning for multifocal adenocarcinoma. There was also noted to be an incompletely visualized homogeneous right suprarenal lesion, likely a cyst.  Super D chest CT scan on 07/24/2020 showed dominant mixed cystic/solid lesions in the bilateral lower lobes that remained suspicious for multifocal adenocarcinoma.  The patient was referred to [name] and underwent a video bronchoscopy with electromagnetic navigation on 07/28/2020. Cytology from the procedure revealed malignant cells consistent with adenocarcinoma of the left lower lobe fine need aspiration and brushing. There were no malignant cells identified in the right lower lobe.  PET scan on 08/17/2020 showed a 2.0 x 1.6 cm intraluminal soft tissue density along the right posterior urinary bladder that was suspicious for a urothelial tumor. Cystoscopy was recommended. It also showed a sub-solid left lower lobe mass and sub-solid right lower lobe mass, both of which had low-grade metabolic activity and were suspicious for low-grade malignancy.  The patient was  seen in consultation with Dr. Julien Nordmann on 08/18/2020, during which time it was recommended that she see Dr. Kipp Brood for evaluation and consideration of surgical resection of the left lower lobe lung mass with close monitoring of the right lower lobe lesion, which may require either surgical resection or repeat biopsy in the future. If she is not a surgical candidate, then she was noted to possibly benefit from curative radiotherapy directed at the lesions. For the suspicious right posterior renal bladder soft tissue density, he recommended referral to urology for consideration of cystoscopy and confirmation of the diagnosis.  The patient was seen by Dr. Kipp Brood on 08/28/2020. From a functional standpoint, she was noted to not be a good operative candidate. Given her lung function, she wold likely not tolerate a single lobectomy, let alone bilateral lobectomies without being on life-long oxygen. She was given the option of undergoing a left lower lobectomy robotically with radiation therapy to the right lobe versus radiation therapy to bilateral lobes.    The patient has now decided to proceed with radiation therapy after carefully reviewing her options.  PREVIOUS RADIATION THERAPY: No  PAST MEDICAL HISTORY:  Past Medical History:  Diagnosis Date  . Allergy    seasonal  . Anxiety 2009  . Arthritis 2000  . Depression   . Depression 07/19/2013  . Fibroid 2003  . Organ donation 11/29/2019  . Wears glasses     PAST SURGICAL HISTORY: Past Surgical History:  Procedure Laterality Date  . ARTHROTOMY Right 11/09/2015   Procedure: RIGHT WRIST DISTAL RADIAL ULNAR JOINT ARTHROTOMY AND DEBRIDEMENT,  AND ;  Surgeon: Iran Planas, MD;  Location: [facility];  Service: Orthopedics;  Laterality: Right;  . BRONCHIAL BIOPSY  07/28/2020  Procedure: BRONCHIAL BIOPSIES;  Surgeon: [name], DO;  Location: [facility];  Service: Pulmonary;;  . BRONCHIAL BRUSHINGS  07/28/2020   Procedure: BRONCHIAL  BRUSHINGS;  Surgeon: [name], DO;  Location: [facility];  Service: Pulmonary;;  . BRONCHIAL NEEDLE ASPIRATION BIOPSY  07/28/2020   Procedure: BRONCHIAL NEEDLE ASPIRATION BIOPSIES;  Surgeon: [name], DO;  Location: [facility];  Service: Pulmonary;;  . BRONCHIAL WASHINGS  07/28/2020   Procedure: BRONCHIAL WASHINGS;  Surgeon: [name], DO;  Location: [facility];  Service: Pulmonary;;  . COLONOSCOPY    . HARDWARE REMOVAL Right 07/24/2013   Procedure: RIGHT WRIST HARDWARE REMOVAL, JOINT RELEASE, RIGHT HAND MANIPULATION UNDER ANESTHESIA;  Surgeon: Linna Hoff, MD;  Location: [facility];  Service: Orthopedics;  Laterality: Right;  . NECK SURGERY  2003   Cervical vertebroplasty   . OPEN REDUCTION INTERNAL FIXATION (ORIF) DISTAL RADIAL FRACTURE Right 11/08/2012   Procedure: OPEN REDUCTION INTERNAL FIXATION (ORIF) RIGHT DISTAL RADIUS FRACTURE;  Surgeon: Linna Hoff, MD;  Location: [facility];  Service: Orthopedics;  Laterality: Right;  Marland Kitchen VIDEO BRONCHOSCOPY WITH ENDOBRONCHIAL NAVIGATION N/A 07/28/2020   Procedure: VIDEO BRONCHOSCOPY WITH ENDOBRONCHIAL NAVIGATION;  Surgeon: [name], DO;  Location: [facility];  Service: Pulmonary;  Laterality: N/A;  . WRIST ARTHROPLASTY Right 11/09/2015   Procedure: OR DISTAL ULNAR RESECTION ARTHROPLASTY ;  Surgeon: Iran Planas, MD;  Location: [facility];  Service: Orthopedics;  Laterality: Right;  . WRIST ARTHROSCOPY WITH DEBRIDEMENT Right 07/21/2014   Procedure: RIGHT WRIST DISTAL RADIAL ULNAR JOINT DEBRIDEMENT AND JOINT RELEASE POSSIBLE TENDON INTERPOSITION;  Surgeon: Linna Hoff, MD;  Location: [facility];  Service: Orthopedics;  Laterality: Right;    FAMILY HISTORY:  Family History  Problem Relation Age of Onset  . Hypertension Mother   . Hyperlipidemia Mother   . Early death Sister 8       Drug overdose  . Dementia Father   . Cancer Father        lung  . Parkinson's disease Father   .  Hypertension Daughter   . Diabetes Daughter   . Cancer Brother        lung    SOCIAL HISTORY:  Social History   Tobacco Use  . Smoking status: Former Smoker    Packs/day: 1.00    Years: 41.00    Pack years: 41.00    Types: Cigarettes    Start date: 04/13/1975    Quit date: 07/14/2020    Years since quitting: 0.1  . Smokeless tobacco: Never Used  Vaping Use  . Vaping Use: Never used  Substance Use Topics  . Alcohol use: No    Comment: Holidays only  . Drug use: No    ALLERGIES:  Allergies  Allergen Reactions  . Prednisone Other (See Comments)    'Made me feel funny inside my head"-pt cannot be specific  . Codeine Nausea And Vomiting and Other (See Comments)    Hallucinations    MEDICATIONS:  Current Outpatient Medications  Medication Sig Dispense Refill  . acetaminophen (TYLENOL) 650 MG CR tablet Take 1,300 mg by mouth every 8 (eight) hours as needed for pain.    . Ascorbic Acid (VITAMIN C ADULT GUMMIES PO) Take 1 tablet by mouth daily.    Marland Kitchen atorvastatin (LIPITOR) 40 MG tablet Take 1 tablet (40 mg total) by mouth daily. 90 tablet 3  . azelastine (OPTIVAR) 0.05 % ophthalmic solution Place 1 drop into both eyes 2 (two) times  daily as needed for allergies.    . carbamide peroxide (DEBROX) 6.5 % OTIC solution Place 5 drops into both ears 2 (two) times daily. (Patient taking differently: Place 5 drops into both ears 2 (two) times daily as needed (ear pain).) 15 mL 0  . cetirizine (ZYRTEC) 10 MG tablet Take 1 tablet (10 mg total) by mouth daily. (Patient taking differently: Take 10 mg by mouth daily as needed for allergies.) 30 tablet 6  . Cholecalciferol (DIALYVITE VITAMIN D 5000) 125 MCG (5000 UT) capsule Take 5,000 Units by mouth daily.    . Cholecalciferol (VITAMIN D) 2000 units CAPS Take 1 capsule (2,000 Units total) by mouth daily. (Patient not taking: Reported on 09/08/2020) 90 capsule 11  . clobetasol cream (TEMOVATE) 1.02 % Apply 1 application topically 2 (two) times  daily as needed. (Patient taking differently: Apply 1 application topically 2 (two) times daily as needed (psoriasis).) 30 g 2  . conjugated estrogens (PREMARIN) vaginal cream Place 1 Applicatorful vaginally daily. (Patient taking differently: Place 1 Applicatorful vaginally at bedtime.) 42.5 g 12  . cyclobenzaprine (FLEXERIL) 10 MG tablet Take 1 tablet (10 mg total) by mouth 2 (two) times daily as needed for muscle spasms. 20 tablet 0  . docusate sodium (COLACE) 100 MG capsule Take 1 capsule (100 mg total) by mouth 2 (two) times daily. (Patient not taking: Reported on 09/08/2020) 180 capsule 1  . fluticasone (FLONASE) 50 MCG/ACT nasal spray Place 2 sprays into both nostrils daily. (Patient taking differently: Place 2 sprays into both nostrils daily as needed for allergies.) 16 g 6  . gabapentin (NEURONTIN) 300 MG capsule Take 3 capsules (900 mg total) by mouth 3 (three) times daily. (Patient taking differently: Take 300-600 mg by mouth 2 (two) times daily as needed (pain).) 270 capsule 3  . hydrOXYzine (ATARAX/VISTARIL) 50 MG tablet Take 0.5 tablets (25 mg total) by mouth at bedtime as needed. (Patient taking differently: Take 50 mg by mouth at bedtime as needed for anxiety or itching (sleep).) 60 tablet 2  . Melatonin 10 MG TBCR Take 10 mg by mouth at bedtime. 90 tablet 2  . nicotine (NICODERM CQ - DOSED IN MG/24 HOURS) 21 mg/24hr patch Place 21 mg onto the skin daily.    . nicotine (NICODERM CQ - DOSED IN MG/24 HR) 7 mg/24hr patch Place 1 patch (7 mg total) onto the skin daily. (Patient not taking: No sig reported) 28 patch 1  . venlafaxine XR (EFFEXOR-XR) 37.5 MG 24 hr capsule Take 37.5 mg by mouth daily with breakfast.     No current facility-administered medications for this encounter.    REVIEW OF SYSTEMS:  A 10+ POINT REVIEW OF SYSTEMS WAS OBTAINED including neurology, dermatology, psychiatry, cardiac, respiratory, lymph, extremities, GI, GU, musculoskeletal, constitutional, reproductive,  HEENT.  sHe denies any pain within the chest area significant cough or hemoptysis.   PHYSICAL EXAM:  height is 5\' 8"  (1.727 m) and weight is 177 lb 12.8 oz (80.6 kg). Her temperature is 97.6 F (36.4 C). Her blood pressure is 130/82 and her pulse is 87. Her respiration is 18 and oxygen saturation is 95%.   General: Alert and oriented, in no acute distress HEENT: Head is normocephalic. Extraocular movements are intact. Oropharynx is clear. Neck: Neck is supple, no palpable cervical or supraclavicular lymphadenopathy. Heart: Regular in rate and rhythm with no murmurs, rubs, or gallops. Chest: Clear to auscultation bilaterally, with no rhonchi, wheezes, or rales. Abdomen: Soft, nontender, nondistended, with no rigidity or guarding. Extremities: No  cyanosis or edema. Lymphatics: see Neck Exam Skin: No concerning lesions. Musculoskeletal: symmetric strength and muscle tone throughout. Neurologic: Cranial nerves II through XII are grossly intact. No obvious focalities. Speech is fluent. Coordination is intact. Psychiatric: Judgment and insight are intact. Affect is appropriate.   ECOG = 1  0 - Asymptomatic (Fully active, able to carry on all predisease activities without restriction)  1 - Symptomatic but completely ambulatory (Restricted in physically strenuous activity but ambulatory and able to carry out work of a light or sedentary nature. For example, light housework, office work)  2 - Symptomatic, <50% in bed during the day (Ambulatory and capable of all self care but unable to carry out any work activities. Up and about more than 50% of waking hours)  3 - Symptomatic, >50% in bed, but not bedbound (Capable of only limited self-care, confined to bed or chair 50% or more of waking hours)  4 - Bedbound (Completely disabled. Cannot carry on any self-care. Totally confined to bed or chair)  5 - Death   Eustace Pen MM, Creech RH, Tormey DC, et al. [id number]). "Toxicity and response criteria of the  [facility]". [place] Oncol. 5 (6): 649-55  LABORATORY DATA:  Lab Results  Component Value Date   WBC 8.2 09/10/2020   HGB 12.9 09/10/2020   HCT 41.3 09/10/2020   MCV 89.6 09/10/2020   PLT 359 09/10/2020   NEUTROABS 5.6 08/18/2020   Lab Results  Component Value Date   NA 142 08/18/2020   K 4.5 08/18/2020   CL 105 08/18/2020   CO2 31 08/18/2020   GLUCOSE 80 08/18/2020   CREATININE 1.05 (H) 08/18/2020   CALCIUM 10.0 08/18/2020      RADIOGRAPHY: No results found.    IMPRESSION: Stage IIA (T2b, N0, M0) non-small cell lung cancer, adenocarcinoma of the left lower lobe with suspicious right lower lobe cystic lesion  I discussed radiation therapy in detail with the patient.  Given the patient's pulmonary status I would be more in favor of just treating the left lower lobe lesion, the one that was biopsy-proven adenocarcinoma and following the lesion in the right lower lobe with serial scans and to treat this lesion in the future if necessary.  In light of her lung function and performance status and the size of her lesions I feel treatment to both lesions may be a little too much for her given anticipated scarring in the lung areas from her radiation treatment.  Today, I talked to the patient about the findings and work-up thus far.  We discussed the natural history of lung cancer and general treatment, highlighting the role of radiotherapy in the management.  We discussed the available radiation techniques, and focused on the details of logistics and delivery.  We reviewed the anticipated acute and late sequelae associated with radiation in this setting.  The patient was encouraged to ask questions that I answered to the best of my ability.  A patient consent form was discussed and signed.  We retained a copy for our records.  The patient would like to proceed with radiation and will be scheduled for CT simulation.  PLAN: Patient will be scheduled for CT  simulation in the near future. In light of the size lesion in close proximity to the chest wall I doubt that she will be a candidate for SBRT but certainly a candidate for hypofractionated radiation therapy over approximately 10 treatments.  She will be set up and planned as  if  she will be getting SBRT in the event that planning allows [place] to do this treatment.  Total time spent in this encounter was 60 minutes which included reviewing the patient's most recent chest CT scans, PET scan, consultations, follow-ups, bronchoscopy, cytology report, physical examination, and documentation.  ------------------------------------------------  Blair Promise, PhD, MD  This document serves as a record of services personally performed by Gery Pray, MD. It was created on his behalf by Clerance Lav, a trained medical scribe. The creation of this record is based on the scribe's personal observations and the provider's statements to them. This document has been checked and approved by the attending provider.

## 2020-09-17 ENCOUNTER — Encounter: Payer: Self-pay | Admitting: General Practice

## 2020-09-17 NOTE — Progress Notes (Signed)
[facility] Psychosocial Distress Screening Clinical Social Work  Clinical Social Work was referred by distress screening protocol.  The patient scored a 10 on the Psychosocial Distress Thermometer which indicates severe distress. Clinical Social Worker did not call patient as per referral, patient declined social work referral.  Please reconsult if needed.   to assess for distress and other psychosocial needs.    ONCBCN DISTRESS SCREENING 09/16/2020  Screening Type Initial Screening  Distress experienced in past week (1-10) 10  Information Concerns Type Lack of info about treatment;Lack of info about diagnosis;Lack of info about maintaining fitness  Referral to clinical social work No     Clinical Social Worker follow up needed: No.  If yes, follow up plan:  Beverely Pace, [place], LCSW Clinical Social Worker Phone:  [phone]

## 2020-09-18 ENCOUNTER — Encounter (HOSPITAL_COMMUNITY): Payer: Self-pay | Admitting: Urology

## 2020-09-18 ENCOUNTER — Ambulatory Visit (HOSPITAL_COMMUNITY): Payer: Medicare Other | Admitting: Anesthesiology

## 2020-09-18 ENCOUNTER — Encounter (HOSPITAL_COMMUNITY)
Admission: RE | Disposition: A | Discharge status: Home / Self Care | Payer: Self-pay | Source: Home / Self Care | Attending: Urology

## 2020-09-18 ENCOUNTER — Ambulatory Visit (HOSPITAL_COMMUNITY)
Admission: RE | Admit: 2020-09-18 | Discharge: 2020-09-18 | Disposition: A | Discharge status: Home / Self Care | Payer: Medicare Other | Attending: Urology | Admitting: Urology

## 2020-09-18 ENCOUNTER — Other Ambulatory Visit: Payer: Self-pay

## 2020-09-18 ENCOUNTER — Ambulatory Visit (HOSPITAL_COMMUNITY): Payer: Medicare Other

## 2020-09-18 DIAGNOSIS — C679 Malignant neoplasm of bladder, unspecified: Secondary | ICD-10-CM | POA: Diagnosis not present

## 2020-09-18 DIAGNOSIS — E668 Other obesity: Secondary | ICD-10-CM | POA: Diagnosis not present

## 2020-09-18 DIAGNOSIS — C3491 Malignant neoplasm of unspecified part of right bronchus or lung: Secondary | ICD-10-CM | POA: Diagnosis not present

## 2020-09-18 DIAGNOSIS — N130 Hydronephrosis with ureteropelvic junction obstruction: Secondary | ICD-10-CM | POA: Diagnosis not present

## 2020-09-18 DIAGNOSIS — C678 Malignant neoplasm of overlapping sites of bladder: Secondary | ICD-10-CM | POA: Diagnosis not present

## 2020-09-18 DIAGNOSIS — Z87891 Personal history of nicotine dependence: Secondary | ICD-10-CM | POA: Insufficient documentation

## 2020-09-18 DIAGNOSIS — C672 Malignant neoplasm of lateral wall of bladder: Secondary | ICD-10-CM | POA: Insufficient documentation

## 2020-09-18 DIAGNOSIS — M1990 Unspecified osteoarthritis, unspecified site: Secondary | ICD-10-CM | POA: Diagnosis not present

## 2020-09-18 DIAGNOSIS — N1330 Unspecified hydronephrosis: Secondary | ICD-10-CM | POA: Diagnosis not present

## 2020-09-18 DIAGNOSIS — Z888 Allergy status to other drugs, medicaments and biological substances status: Secondary | ICD-10-CM | POA: Insufficient documentation

## 2020-09-18 DIAGNOSIS — C3492 Malignant neoplasm of unspecified part of left bronchus or lung: Secondary | ICD-10-CM | POA: Diagnosis not present

## 2020-09-18 DIAGNOSIS — Z885 Allergy status to narcotic agent status: Secondary | ICD-10-CM | POA: Diagnosis not present

## 2020-09-18 DIAGNOSIS — N131 Hydronephrosis with ureteral stricture, not elsewhere classified: Secondary | ICD-10-CM | POA: Diagnosis not present

## 2020-09-18 HISTORY — PX: CYSTOSCOPY WITH RETROGRADE PYELOGRAM, URETEROSCOPY AND STENT PLACEMENT: SHX5789

## 2020-09-18 HISTORY — PX: TRANSURETHRAL RESECTION OF BLADDER TUMOR: SHX2575

## 2020-09-18 LAB — CREATININE, SERUM
Creatinine, Ser: 0.9 mg/dL (ref 0.44–1.00)
GFR, Estimated: >60 mL/min (ref >60)

## 2020-09-18 SURGERY — CYSTOURETEROSCOPY, WITH RETROGRADE PYELOGRAM AND STENT INSERTION
Anesthesia: General | Site: Ureter

## 2020-09-18 MED ORDER — GENTAMICIN SULFATE 40 MG/ML IJ SOLN
5.0000 mg/kg | INTRAVENOUS | Status: AC
  Administered 2020-09-18: 400 mg via INTRAVENOUS
  Filled 2020-09-18: qty 10

## 2020-09-18 MED ORDER — FENTANYL CITRATE (PF) 100 MCG/2ML IJ SOLN
INTRAMUSCULAR | Status: AC
  Filled 2020-09-18: qty 2

## 2020-09-18 MED ORDER — IOHEXOL 300 MG/ML  SOLN
INTRAMUSCULAR | Status: DC | PRN
  Administered 2020-09-18: 17 mL

## 2020-09-18 MED ORDER — FENTANYL CITRATE (PF) 100 MCG/2ML IJ SOLN
25.0000 ug | INTRAMUSCULAR | Status: DC | PRN
  Administered 2020-09-18 (×2): 50 ug via INTRAVENOUS

## 2020-09-18 MED ORDER — FENTANYL CITRATE (PF) 250 MCG/5ML IJ SOLN
INTRAMUSCULAR | Status: DC | PRN
  Administered 2020-09-18: 50 ug via INTRAVENOUS

## 2020-09-18 MED ORDER — ONDANSETRON HCL 4 MG/2ML IJ SOLN
INTRAMUSCULAR | Status: AC
  Filled 2020-09-18: qty 2

## 2020-09-18 MED ORDER — ACETAMINOPHEN 500 MG PO TABS
ORAL_TABLET | ORAL | Status: AC
  Administered 2020-09-18: 1000 mg via ORAL
  Filled 2020-09-18: qty 2

## 2020-09-18 MED ORDER — ONDANSETRON HCL 4 MG/2ML IJ SOLN
INTRAMUSCULAR | Status: DC | PRN
  Administered 2020-09-18: 4 mg via INTRAVENOUS

## 2020-09-18 MED ORDER — SUGAMMADEX SODIUM 200 MG/2ML IV SOLN
INTRAVENOUS | Status: DC | PRN
  Administered 2020-09-18: 200 mg via INTRAVENOUS

## 2020-09-18 MED ORDER — ONDANSETRON HCL 4 MG/2ML IJ SOLN: 4.0000 mg | Freq: Once | INTRAMUSCULAR | Status: DC | PRN

## 2020-09-18 MED ORDER — DEXAMETHASONE SODIUM PHOSPHATE 10 MG/ML IJ SOLN
INTRAMUSCULAR | Status: DC | PRN
  Administered 2020-09-18: 4 mg via INTRAVENOUS

## 2020-09-18 MED ORDER — SODIUM CHLORIDE 0.9 % IR SOLN
Status: DC | PRN
  Administered 2020-09-18: 1000 mL

## 2020-09-18 MED ORDER — LACTATED RINGERS IV SOLN: INTRAVENOUS | Status: DC

## 2020-09-18 MED ORDER — TRAMADOL HCL 50 MG PO TABS
100.0000 mg | ORAL_TABLET | Freq: Once | ORAL | Status: AC
  Administered 2020-09-18: 100 mg via ORAL

## 2020-09-18 MED ORDER — PROPOFOL 10 MG/ML IV BOLUS
INTRAVENOUS | Status: AC
  Filled 2020-09-18: qty 20

## 2020-09-18 MED ORDER — CEPHALEXIN 500 MG PO CAPS: 500.0000 mg | ORAL_CAPSULE | Freq: Two times a day (BID) | ORAL | 0 refills | Status: AC

## 2020-09-18 MED ORDER — CHLORHEXIDINE GLUCONATE 0.12 % MT SOLN
15.0000 mL | Freq: Once | OROMUCOSAL | Status: AC
  Administered 2020-09-18: 15 mL via OROMUCOSAL

## 2020-09-18 MED ORDER — MIDAZOLAM HCL 2 MG/2ML IJ SOLN
INTRAMUSCULAR | Status: AC
  Filled 2020-09-18: qty 2

## 2020-09-18 MED ORDER — ROCURONIUM BROMIDE 10 MG/ML (PF) SYRINGE
PREFILLED_SYRINGE | INTRAVENOUS | Status: AC
  Filled 2020-09-18: qty 10

## 2020-09-18 MED ORDER — MIDAZOLAM HCL 5 MG/5ML IJ SOLN
INTRAMUSCULAR | Status: DC | PRN
  Administered 2020-09-18: 2 mg via INTRAVENOUS

## 2020-09-18 MED ORDER — PHENYLEPHRINE HCL-NACL 10-0.9 MG/250ML-% IV SOLN
INTRAVENOUS | Status: DC | PRN
  Administered 2020-09-18: 20 ug/min via INTRAVENOUS

## 2020-09-18 MED ORDER — ROCURONIUM BROMIDE 10 MG/ML (PF) SYRINGE
PREFILLED_SYRINGE | INTRAVENOUS | Status: DC | PRN
  Administered 2020-09-18: 50 mg via INTRAVENOUS

## 2020-09-18 MED ORDER — DEXAMETHASONE SODIUM PHOSPHATE 10 MG/ML IJ SOLN
INTRAMUSCULAR | Status: AC
  Filled 2020-09-18: qty 1

## 2020-09-18 MED ORDER — SODIUM CHLORIDE 0.9 % IR SOLN
Status: DC | PRN
Start: 2020-09-18 — End: 2020-09-18
  Administered 2020-09-18: 6000 mL

## 2020-09-18 MED ORDER — LIDOCAINE 2% (20 MG/ML) 5 ML SYRINGE
INTRAMUSCULAR | Status: DC | PRN
  Administered 2020-09-18: 100 mg via INTRAVENOUS

## 2020-09-18 MED ORDER — ORAL CARE MOUTH RINSE: 15.0000 mL | Freq: Once | OROMUCOSAL | Status: AC

## 2020-09-18 MED ORDER — ACETAMINOPHEN 500 MG PO TABS: 1000.0000 mg | ORAL_TABLET | Freq: Once | ORAL | Status: AC

## 2020-09-18 MED ORDER — TRAMADOL HCL 50 MG PO TABS
ORAL_TABLET | ORAL | Status: AC
  Filled 2020-09-18: qty 2

## 2020-09-18 MED ORDER — TRAMADOL HCL 50 MG PO TABS: 50.0000 mg | ORAL_TABLET | Freq: Four times a day (QID) | ORAL | 0 refills | Status: DC | PRN

## 2020-09-18 MED ORDER — PROPOFOL 10 MG/ML IV BOLUS
INTRAVENOUS | Status: DC | PRN
  Administered 2020-09-18: 150 mg via INTRAVENOUS
  Administered 2020-09-18: 20 mg via INTRAVENOUS

## 2020-09-18 SURGICAL SUPPLY — 31 items
BAG DRN RND TRDRP ANRFLXCHMBR (UROLOGICAL SUPPLIES)
BAG URINE DRAIN 2000ML AR STRL (UROLOGICAL SUPPLIES) IMPLANT
BAG URO CATCHER STRL LF (MISCELLANEOUS) ×4 IMPLANT
BASKET LASER NITINOL 1.9FR (BASKET) IMPLANT
BSKT STON RTRVL 120 1.9FR (BASKET)
CATH INTERMIT  6FR 70CM (CATHETERS) ×4 IMPLANT
CLOTH BEACON ORANGE TIMEOUT ST (SAFETY) ×4 IMPLANT
ELECT REM PT RETURN 15FT ADLT (MISCELLANEOUS) ×4 IMPLANT
EVACUATOR MICROVAS BLADDER (UROLOGICAL SUPPLIES) IMPLANT
EXTRACTOR STONE 1.7FRX115CM (UROLOGICAL SUPPLIES) IMPLANT
GLOVE SURG ENC TEXT LTX SZ7.5 (GLOVE) ×4 IMPLANT
GOWN STRL REUS W/TWL LRG LVL3 (GOWN DISPOSABLE) ×4 IMPLANT
GUIDEWIRE ANG ZIPWIRE 038X150 (WIRE) ×4 IMPLANT
GUIDEWIRE STR DUAL SENSOR (WIRE) ×4 IMPLANT
KIT TURNOVER KIT A (KITS) IMPLANT
LASER FIB FLEXIVA PULSE ID 365 (Laser) IMPLANT
LOOP CUT BIPOLAR 24F LRG (ELECTROSURGICAL) ×4 IMPLANT
MANIFOLD NEPTUNE II (INSTRUMENTS) ×4 IMPLANT
PACK CYSTO (CUSTOM PROCEDURE TRAY) ×4 IMPLANT
PENCIL SMOKE EVACUATOR (MISCELLANEOUS) IMPLANT
SHEATH URETERAL 12FRX28CM (UROLOGICAL SUPPLIES) IMPLANT
SHEATH URETERAL 12FRX35CM (MISCELLANEOUS) IMPLANT
STENT POLARIS 5FRX24 (STENTS) ×4 IMPLANT
SYR TOOMEY IRRIG 70ML (MISCELLANEOUS) ×4
SYRINGE TOOMEY IRRIG 70ML (MISCELLANEOUS) ×2 IMPLANT
TRACTIP FLEXIVA PULS ID 200XHI (Laser) IMPLANT
TRACTIP FLEXIVA PULSE ID 200 (Laser)
TUBE FEEDING 8FR 16IN STR KANG (MISCELLANEOUS) ×4 IMPLANT
TUBING CONNECTING 10 (TUBING) ×3 IMPLANT
TUBING CONNECTING 10' (TUBING) ×1
TUBING UROLOGY SET (TUBING) ×4 IMPLANT

## 2020-09-18 NOTE — H&P (Signed)
[name] is an 65 y.o. female.    Chief Complaint: Pre-Op TURBT, Retrogrades, Rt diagnostic ureteroscopy.   HPI:   1 - Bladder Cancer - 3cm Rt wall mass incidetnal on PET-CT 08/2020 during staging of lung cancer. Cysto 08/2020 confirms and Rt UO not visualized.   2 - Right Hydronephrosis / Likely Partial Rt UPJO - rt mild/moderate hydro without ureteral dilation incidetnal on PET-CT 08/2020. NO stones. No pertinant prior imaging. Cr 1.1. No significant parenchymal thinning.    PMH sig for obesity, DM2 (A1c 6), Bilateral Lung CA (treatment plan being formulated given fair funciotnal status by Tami Ribas MD CT surgery and M. Mohamed MD med-onc), 40PY smoker (quite 2021, can walk aroudn block, has to rest up a flight of stairs), No ischemic CV disease / blood thinners. HEr PCP is with [facility].   Today "[name]" is seen to proceed with cysto, bilateral retrogardes, TURBT, Rt diagnostic ureteroscopy for managmetn of bladder cancer and Rt hydro. Most recent UCX strep (?contaminant) and has been on ABX. No interval fevers.    Past Medical History:  Diagnosis Date  . Allergy    seasonal  . Anxiety 2009  . Arthritis 2000  . Depression   . Depression 07/19/2013  . Fibroid 2003  . Organ donation 11/29/2019  . Wears glasses     Past Surgical History:  Procedure Laterality Date  . ARTHROTOMY Right 11/09/2015   Procedure: RIGHT WRIST DISTAL RADIAL ULNAR JOINT ARTHROTOMY AND DEBRIDEMENT,  AND ;  Surgeon: Iran Planas, MD;  Location: [facility];  Service: Orthopedics;  Laterality: Right;  . BRONCHIAL BIOPSY  07/28/2020   Procedure: BRONCHIAL BIOPSIES;  Surgeon: [name], DO;  Location: [facility];  Service: Pulmonary;;  . BRONCHIAL BRUSHINGS  07/28/2020   Procedure: BRONCHIAL BRUSHINGS;  Surgeon: [name], DO;  Location: [facility];  Service: Pulmonary;;  . BRONCHIAL NEEDLE ASPIRATION BIOPSY  07/28/2020   Procedure: BRONCHIAL NEEDLE ASPIRATION BIOPSIES;   Surgeon: [name], DO;  Location: [facility];  Service: Pulmonary;;  . BRONCHIAL WASHINGS  07/28/2020   Procedure: BRONCHIAL WASHINGS;  Surgeon: [name], DO;  Location: [facility];  Service: Pulmonary;;  . COLONOSCOPY    . HARDWARE REMOVAL Right 07/24/2013   Procedure: RIGHT WRIST HARDWARE REMOVAL, JOINT RELEASE, RIGHT HAND MANIPULATION UNDER ANESTHESIA;  Surgeon: Linna Hoff, MD;  Location: [facility];  Service: Orthopedics;  Laterality: Right;  . NECK SURGERY  2003   Cervical vertebroplasty   . OPEN REDUCTION INTERNAL FIXATION (ORIF) DISTAL RADIAL FRACTURE Right 11/08/2012   Procedure: OPEN REDUCTION INTERNAL FIXATION (ORIF) RIGHT DISTAL RADIUS FRACTURE;  Surgeon: Linna Hoff, MD;  Location: [facility];  Service: Orthopedics;  Laterality: Right;  Marland Kitchen VIDEO BRONCHOSCOPY WITH ENDOBRONCHIAL NAVIGATION N/A 07/28/2020   Procedure: VIDEO BRONCHOSCOPY WITH ENDOBRONCHIAL NAVIGATION;  Surgeon: [name], DO;  Location: [facility];  Service: Pulmonary;  Laterality: N/A;  . WRIST ARTHROPLASTY Right 11/09/2015   Procedure: OR DISTAL ULNAR RESECTION ARTHROPLASTY ;  Surgeon: Iran Planas, MD;  Location: [facility];  Service: Orthopedics;  Laterality: Right;  . WRIST ARTHROSCOPY WITH DEBRIDEMENT Right 07/21/2014   Procedure: RIGHT WRIST DISTAL RADIAL ULNAR JOINT DEBRIDEMENT AND JOINT RELEASE POSSIBLE TENDON INTERPOSITION;  Surgeon: Linna Hoff, MD;  Location: [facility];  Service: Orthopedics;  Laterality: Right;    Family History  Problem Relation Age of Onset  . Hypertension Mother   . Hyperlipidemia Mother   . Early death  Sister 55       Drug overdose  . Dementia Father   . Cancer Father        lung  . Parkinson's disease Father   . Hypertension Daughter   . Diabetes Daughter   . Cancer Brother        lung   Social History:  reports that she quit smoking about 2 months ago. Her smoking use included cigarettes. She started smoking about 45  years ago. She has a 41.00 pack-year smoking history. She has never used smokeless tobacco. She reports that she does not drink alcohol and does not use drugs.  Allergies:  Allergies  Allergen Reactions  . Prednisone Other (See Comments)    'Made me feel funny inside my head"-pt cannot be specific  . Codeine Nausea And Vomiting and Other (See Comments)    Hallucinations    No medications prior to admission.    No results found for this or any previous visit (from the past 48 hour(s)). No results found.  Review of Systems  Constitutional: Negative for chills and fever.  Respiratory: Positive for shortness of breath.   All other systems reviewed and are negative.   There were no vitals taken for this visit. Physical Exam Vitals reviewed.  HENT:     Head: Normocephalic.     Nose: Nose normal.     Mouth/Throat:     Mouth: Mucous membranes are moist.  Eyes:     Pupils: Pupils are equal, round, and reactive to light.  Cardiovascular:     Rate and Rhythm: Normal rate.  Abdominal:     Comments: Stable moderate obesity.   Genitourinary:    Comments: No CVAT Musculoskeletal:        General: Normal range of motion.     Cervical back: Normal range of motion.  Skin:    General: Skin is warm.  Neurological:     General: No focal deficit present.     Mental Status: She is alert.  Psychiatric:        Mood and Affect: Mood normal.      Assessment/Plan  Proceed as planned with TURBT, Bilateral Retrogrades, Rt ureteroscopy. Risks, benefits, alternatives, expected peri-op course discussed previously and reiterated today.   Alexis Frock, MD 09/18/2020, 7:20 AM

## 2020-09-18 NOTE — Anesthesia Procedure Notes (Signed)
Procedure Name: Intubation Performed by: Milford Cage, CRNA Pre-anesthesia Checklist: Patient identified, Emergency Drugs available, Suction available and Patient being monitored Patient Re-evaluated:Patient Re-evaluated prior to induction Oxygen Delivery Method: Circle System Utilized Preoxygenation: Pre-oxygenation with 100% oxygen Induction Type: IV induction Ventilation: Mask ventilation without difficulty Laryngoscope Size: Miller and 2 Grade View: Grade II Tube type: Oral Tube size: 7.0 mm Number of attempts: 1 Airway Equipment and Method: Stylet and Oral airway Placement Confirmation: ETT inserted through vocal cords under direct vision,  positive ETCO2 and breath sounds checked- equal and bilateral Secured at: 23 cm Tube secured with: Tape Dental Injury: Teeth and Oropharynx as per pre-operative assessment

## 2020-09-18 NOTE — Discharge Instructions (Signed)
1 - You may have urinary urgency (bladder spasms) and bloody urine on / off with stent in place. This is normal.  2 - Remove tethered stent on Monday morning at home by pulling on string, then blue-white plastic tubing, and discarding. Office is open Monday if any problems arise.   3 - Call MD or go to ER for fever >102, severe pain / nausea / vomiting not relieved by medications, or acute change in medical status

## 2020-09-18 NOTE — Transfer of Care (Signed)
Immediate Anesthesia Transfer of Care Note  Patient: [name]  Procedure(s) Performed: CYSTOSCOPY WITH RETROGRADE PYELOGRAM bilateral,URETEROSCOPY AND  STENT PLACEMENT right ureter (N/A Ureter) TRANSURETHRAL RESECTION OF BLADDER TUMOR (TURBT) (N/A Bladder)  Patient Location: PACU  Anesthesia Type:General  Level of Consciousness: awake  Airway & Oxygen Therapy: Patient Spontanous Breathing and Patient connected to face mask oxygen  Post-op Assessment: Report given to RN and Post -op Vital signs reviewed and stable  Post vital signs: Reviewed and stable  Last Vitals:  Vitals Value Taken Time  BP 139/77 09/18/20 1504  Temp    Pulse 89 09/18/20 1514  Resp 17 09/18/20 1514  SpO2 94 % 09/18/20 1514  Vitals shown include unvalidated device data.  Last Pain:  Vitals:   09/18/20 1232  TempSrc: Oral  PainSc: 0-No pain         Complications: No complications documented.

## 2020-09-18 NOTE — Anesthesia Preprocedure Evaluation (Addendum)
Anesthesia Evaluation  Patient identified by MRN, date of birth, ID band Patient awake    Reviewed: Allergy & Precautions, NPO status , Patient's Chart, lab work & pertinent test results  Airway Mallampati: II  TM Distance: >3 FB Neck ROM: Full    Dental  (+) Dental Advisory Given, Missing   Pulmonary former smoker,    Pulmonary exam normal breath sounds clear to auscultation       Cardiovascular negative cardio ROS Normal cardiovascular exam Rhythm:Regular Rate:Normal     Neuro/Psych PSYCHIATRIC DISORDERS Anxiety Depression negative neurological ROS     GI/Hepatic negative GI ROS, Neg liver ROS,   Endo/Other  negative endocrine ROS  Renal/GU negative Renal ROS   BLADDER CANCER, RIGHT HYDRONEPHROSIS    Musculoskeletal  (+) Arthritis ,   Abdominal   Peds  Hematology negative hematology ROS (+)   Anesthesia Other Findings Day of surgery medications reviewed with the patient.  Reproductive/Obstetrics                            Anesthesia Physical Anesthesia Plan  ASA: II  Anesthesia Plan: General   Post-op Pain Management:    Induction: Intravenous  PONV Risk Score and Plan: 4 or greater and Midazolam, Dexamethasone, Ondansetron and Diphenhydramine  Airway Management Planned: Oral ETT  Additional Equipment:   Intra-op Plan:   Post-operative Plan: Extubation in OR  Informed Consent: I have reviewed the patients History and Physical, chart, labs and discussed the procedure including the risks, benefits and alternatives for the proposed anesthesia with the patient or authorized representative who has indicated his/her understanding and acceptance.     Dental advisory given  Plan Discussed with: CRNA  Anesthesia Plan Comments:         Anesthesia Quick Evaluation

## 2020-09-18 NOTE — Brief Op Note (Signed)
09/18/2020  2:53 PM  PATIENT:  Jeanice Lim Meadors  65 y.o. female  PRE-OPERATIVE DIAGNOSIS:  BLADDER CANCER, RIGHT HYDRONEPHROSIS  POST-OPERATIVE DIAGNOSIS:  bladder cancer hydronephrosis  PROCEDURE:  Procedure(s) with comments: CYSTOSCOPY WITH RETROGRADE PYELOGRAM bilateral,URETEROSCOPY AND  STENT PLACEMENT right ureter (N/A) - 1 HR TRANSURETHRAL RESECTION OF BLADDER TUMOR (TURBT) (N/A)  SURGEON:  Surgeon(s) and Role:    * Alexis Frock, MD - Primary  PHYSICIAN ASSISTANT:   ASSISTANTS: none   ANESTHESIA:   general  EBL:  minimal   BLOOD ADMINISTERED:none  DRAINS: none   LOCAL MEDICATIONS USED:  NONE  SPECIMEN:  Source of Specimen:  1 - bladder tumor; 2 - base of bladder tumor  DISPOSITION OF SPECIMEN:  PATHOLOGY  COUNTS:  YES  TOURNIQUET:  * No tourniquets in log *  DICTATION: .Other Dictation: Dictation Number  [id number]  PLAN OF CARE: Discharge to home after PACU  PATIENT DISPOSITION:  PACU - hemodynamically stable.   Delay start of Pharmacological VTE agent (>24hrs) due to surgical blood loss or risk of bleeding: yes

## 2020-09-18 NOTE — Anesthesia Postprocedure Evaluation (Signed)
Anesthesia Post Note  Patient: [name]  Procedure(s) Performed: CYSTOSCOPY WITH RETROGRADE PYELOGRAM bilateral,URETEROSCOPY AND  STENT PLACEMENT right ureter (N/A Ureter) TRANSURETHRAL RESECTION OF BLADDER TUMOR (TURBT) (N/A Bladder)     Patient location during evaluation: PACU Anesthesia Type: General Level of consciousness: awake and alert, awake and oriented Pain management: pain level controlled Vital Signs Assessment: post-procedure vital signs reviewed and stable Respiratory status: spontaneous breathing, nonlabored ventilation and respiratory function stable Cardiovascular status: blood pressure returned to baseline and stable Postop Assessment: no apparent nausea or vomiting Anesthetic complications: no   No complications documented.  Last Vitals:  Vitals:   09/18/20 1515 09/18/20 1530  BP: 137/82 (!) 142/86  Pulse: 88 80  Resp: 16 16  Temp: (!) 35.9 C (!) 35.7 C  SpO2: 93% 94%    Last Pain:  Vitals:   09/18/20 1530  TempSrc:   PainSc: [place]

## 2020-09-19 ENCOUNTER — Emergency Department (HOSPITAL_COMMUNITY): Payer: Medicare Other

## 2020-09-19 ENCOUNTER — Other Ambulatory Visit: Payer: Self-pay

## 2020-09-19 ENCOUNTER — Encounter (HOSPITAL_COMMUNITY): Payer: Self-pay

## 2020-09-19 ENCOUNTER — Emergency Department (HOSPITAL_COMMUNITY)
Admission: EM | Admit: 2020-09-19 | Discharge: 2020-09-19 | Disposition: A | Discharge status: Home / Self Care | Payer: Medicare Other | Attending: Emergency Medicine | Admitting: Emergency Medicine

## 2020-09-19 DIAGNOSIS — R112 Nausea with vomiting, unspecified: Secondary | ICD-10-CM | POA: Diagnosis not present

## 2020-09-19 DIAGNOSIS — R319 Hematuria, unspecified: Secondary | ICD-10-CM | POA: Insufficient documentation

## 2020-09-19 DIAGNOSIS — Z87891 Personal history of nicotine dependence: Secondary | ICD-10-CM | POA: Insufficient documentation

## 2020-09-19 DIAGNOSIS — Z853 Personal history of malignant neoplasm of breast: Secondary | ICD-10-CM | POA: Diagnosis not present

## 2020-09-19 DIAGNOSIS — M5136 Other intervertebral disc degeneration, lumbar region: Secondary | ICD-10-CM | POA: Diagnosis not present

## 2020-09-19 DIAGNOSIS — N1330 Unspecified hydronephrosis: Secondary | ICD-10-CM | POA: Diagnosis not present

## 2020-09-19 DIAGNOSIS — R1110 Vomiting, unspecified: Secondary | ICD-10-CM

## 2020-09-19 DIAGNOSIS — Z8651 Personal history of combat and operational stress reaction: Secondary | ICD-10-CM | POA: Diagnosis not present

## 2020-09-19 LAB — COMPREHENSIVE METABOLIC PANEL
ALT: 29 U/L (ref 0–44)
AST: 25 U/L (ref 15–41)
Albumin: 4.3 g/dL (ref 3.5–5.0)
Alkaline Phosphatase: 85 U/L (ref 38–126)
Anion gap: 9 (ref 5–15)
BUN: 17 mg/dL (ref 8–23)
CO2: 26 mmol/L (ref 22–32)
Calcium: 9.4 mg/dL (ref 8.9–10.3)
Chloride: 103 mmol/L (ref 98–111)
Creatinine, Ser: 0.95 mg/dL (ref 0.44–1.00)
GFR, Estimated: >60 mL/min (ref >60)
Glucose, Bld: 117 mg/dL — ABNORMAL HIGH (ref 70–99)
Potassium: 4.3 mmol/L (ref 3.5–5.1)
Sodium: 138 mmol/L (ref 135–145)
Total Bilirubin: 1 mg/dL (ref 0.3–1.2)
Total Protein: 8.2 g/dL — ABNORMAL HIGH (ref 6.5–8.1)

## 2020-09-19 LAB — URINALYSIS, ROUTINE W REFLEX MICROSCOPIC
Bilirubin Urine: NEGATIVE
Glucose, UA: 50 mg/dL — AB
Ketones, ur: NEGATIVE mg/dL
Nitrite: NEGATIVE
Protein, ur: 100 mg/dL — AB
RBC / HPF: >50 RBC/hpf — ABNORMAL HIGH (ref 0–5)
Specific Gravity, Urine: 1.02 (ref 1.005–1.030)
pH: 6 (ref 5.0–8.0)

## 2020-09-19 LAB — CBC
HCT: 40.9 % (ref 36.0–46.0)
Hemoglobin: 13.2 g/dL (ref 12.0–15.0)
MCH: 27.8 pg (ref 26.0–34.0)
MCHC: 32.3 g/dL (ref 30.0–36.0)
MCV: 86.3 fL (ref 80.0–100.0)
Platelets: 394 10*3/uL (ref 150–400)
RBC: 4.74 MIL/uL (ref 3.87–5.11)
RDW: 13.8 % (ref 11.5–15.5)
WBC: 12 10*3/uL — ABNORMAL HIGH (ref 4.0–10.5)
nRBC: 0 % (ref 0.0–0.2)

## 2020-09-19 LAB — LIPASE, BLOOD: Lipase: 24 U/L (ref 11–51)

## 2020-09-19 MED ORDER — SODIUM CHLORIDE 0.9 % IV BOLUS
500.0000 mL | Freq: Once | INTRAVENOUS | Status: AC
  Administered 2020-09-19: 500 mL via INTRAVENOUS

## 2020-09-19 MED ORDER — ONDANSETRON HCL 4 MG/2ML IJ SOLN
4.0000 mg | Freq: Once | INTRAMUSCULAR | Status: AC
  Administered 2020-09-19: 4 mg via INTRAVENOUS
  Filled 2020-09-19: qty 2

## 2020-09-19 MED ORDER — ONDANSETRON 8 MG PO TBDP: 8.0000 mg | ORAL_TABLET | Freq: Three times a day (TID) | ORAL | 0 refills | Status: DC | PRN

## 2020-09-19 MED ORDER — SODIUM CHLORIDE 0.9 % IV SOLN: INTRAVENOUS | Status: DC

## 2020-09-19 NOTE — ED Notes (Signed)
Patient transported to CT 

## 2020-09-19 NOTE — ED Provider Notes (Signed)
[facility] DEPT Provider Note   CSN: [id number] Arrival date & time: 09/19/20  0110     History Chief Complaint  Patient presents with  . Vomiting    [name] is a 65 y.o. female.  64 year old female who had a cystoscopy done yesterday due to history of bladder cancer as hydronephrosis presents with emesis postprocedure yesterday as well as into today.  Denies any fever or chills.  Emesis has been only food contents.  Denies any dysuria but has noted some hematuria.  No abdominal discomfort with this.  Called her urologist and told to come in        Past Medical History:  Diagnosis Date  . Allergy    seasonal  . Anxiety 2009  . Arthritis 2000  . Depression   . Depression 07/19/2013  . Fibroid 2003  . Organ donation 11/29/2019  . Wears glasses     Patient Active Problem List   Diagnosis Date Noted  . Primary adenocarcinoma of lower lobe of left lung ([place]) 08/18/2020  . Mass of urinary bladder 08/18/2020  . Costochondritis, acute 08/14/2020  . Lesion of left lung 07/28/2020  . Lesion of right lung 07/28/2020  . Abnormal CT lung screening 07/10/2020  . Nocturnal enuresis 06/23/2020  . Leg cramping 11/13/2019  . Menopausal vaginal dryness 11/13/2019  . Elevated serum creatinine 10/22/2019  . Close exposure to COVID-19 virus 05/03/2019  . Constipation 04/09/2019  . Chronic hand pain, right 03/26/2019  . Epidermoid cyst 10/29/2018  . Chronic bilateral low back pain without sciatica 02/08/2018  . Dyslipidemia 12/14/2017  . Fatigue 10/30/2017  . Oral herpes 10/19/2015  . Healthcare maintenance 06/08/2015  . Arthritis of spine 03/27/2015  . Lumbar spine pain 01/09/2015  . Hot flashes, menopausal 02/28/2012  . Anxiety state 02/28/2012  . Restless legs 02/28/2012    Past Surgical History:  Procedure Laterality Date  . ARTHROTOMY Right 11/09/2015   Procedure: RIGHT WRIST DISTAL RADIAL ULNAR JOINT ARTHROTOMY AND DEBRIDEMENT,  AND  ;  Surgeon: Iran Planas, MD;  Location: [facility];  Service: Orthopedics;  Laterality: Right;  . BRONCHIAL BIOPSY  07/28/2020   Procedure: BRONCHIAL BIOPSIES;  Surgeon: [name], DO;  Location: [facility];  Service: Pulmonary;;  . BRONCHIAL BRUSHINGS  07/28/2020   Procedure: BRONCHIAL BRUSHINGS;  Surgeon: [name], DO;  Location: [facility];  Service: Pulmonary;;  . BRONCHIAL NEEDLE ASPIRATION BIOPSY  07/28/2020   Procedure: BRONCHIAL NEEDLE ASPIRATION BIOPSIES;  Surgeon: [name], DO;  Location: [facility];  Service: Pulmonary;;  . BRONCHIAL WASHINGS  07/28/2020   Procedure: BRONCHIAL WASHINGS;  Surgeon: [name], DO;  Location: [facility];  Service: Pulmonary;;  . COLONOSCOPY    . CYSTOSCOPY WITH RETROGRADE PYELOGRAM, URETEROSCOPY AND STENT PLACEMENT N/A 09/18/2020   Procedure: CYSTOSCOPY WITH RETROGRADE PYELOGRAM bilateral,URETEROSCOPY AND  STENT PLACEMENT right ureter;  Surgeon: Alexis Frock, MD;  Location: [facility];  Service: Urology;  Laterality: N/A;  1 HR  . HARDWARE REMOVAL Right 07/24/2013   Procedure: RIGHT WRIST HARDWARE REMOVAL, JOINT RELEASE, RIGHT HAND MANIPULATION UNDER ANESTHESIA;  Surgeon: Linna Hoff, MD;  Location: [facility];  Service: Orthopedics;  Laterality: Right;  . NECK SURGERY  2003   Cervical vertebroplasty   . OPEN REDUCTION INTERNAL FIXATION (ORIF) DISTAL RADIAL FRACTURE Right 11/08/2012   Procedure: OPEN REDUCTION INTERNAL FIXATION (ORIF) RIGHT DISTAL RADIUS FRACTURE;  Surgeon: Linna Hoff, MD;  Location: [facility];  Service: Orthopedics;  Laterality: Right;  . TRANSURETHRAL RESECTION OF BLADDER TUMOR N/A 09/18/2020   Procedure: TRANSURETHRAL RESECTION OF BLADDER TUMOR (TURBT);  Surgeon: Alexis Frock, MD;  Location: [facility];  Service: Urology;  Laterality: N/A;  . VIDEO BRONCHOSCOPY WITH ENDOBRONCHIAL NAVIGATION N/A 07/28/2020   Procedure: VIDEO BRONCHOSCOPY WITH ENDOBRONCHIAL NAVIGATION;   Surgeon: [name], DO;  Location: [facility];  Service: Pulmonary;  Laterality: N/A;  . WRIST ARTHROPLASTY Right 11/09/2015   Procedure: OR DISTAL ULNAR RESECTION ARTHROPLASTY ;  Surgeon: Iran Planas, MD;  Location: [facility];  Service: Orthopedics;  Laterality: Right;  . WRIST ARTHROSCOPY WITH DEBRIDEMENT Right 07/21/2014   Procedure: RIGHT WRIST DISTAL RADIAL ULNAR JOINT DEBRIDEMENT AND JOINT RELEASE POSSIBLE TENDON INTERPOSITION;  Surgeon: Linna Hoff, MD;  Location: [facility];  Service: Orthopedics;  Laterality: Right;     OB History    Gravida  2   Para  2   Term  2   Preterm      AB      Living  2     SAB      IAB      Ectopic      Multiple      Live Births              Family History  Problem Relation Age of Onset  . Hypertension Mother   . Hyperlipidemia Mother   . Early death Sister 71       Drug overdose  . Dementia Father   . Cancer Father        lung  . Parkinson's disease Father   . Hypertension Daughter   . Diabetes Daughter   . Cancer Brother        lung    Social History   Tobacco Use  . Smoking status: Former Smoker    Packs/day: 1.00    Years: 41.00    Pack years: 41.00    Types: Cigarettes    Start date: 04/13/1975    Quit date: 07/14/2020    Years since quitting: 0.1  . Smokeless tobacco: Never Used  Vaping Use  . Vaping Use: Never used  Substance Use Topics  . Alcohol use: No    Comment: Holidays only  . Drug use: No    Home Medications Prior to Admission medications   Medication Sig Start Date End Date Taking? Authorizing Provider  acetaminophen (TYLENOL) 650 MG CR tablet Take 1,300 mg by mouth every 8 (eight) hours as needed for pain.    [provider]  Ascorbic Acid (VITAMIN C ADULT GUMMIES PO) Take 1 tablet by mouth daily.    [provider]  atorvastatin (LIPITOR) 40 MG tablet Take 1 tablet (40 mg total) by mouth daily. 10/02/19   Kathrene Alu, MD  azelastine (OPTIVAR) 0.05 % ophthalmic  solution Place 1 drop into both eyes 2 (two) times daily as needed for allergies. 02/18/20   [provider]  carbamide peroxide (DEBROX) 6.5 % OTIC solution Place 5 drops into both ears 2 (two) times daily. Patient taking differently: Place 5 drops into both ears 2 (two) times daily as needed (ear pain). 06/23/20   [name], MD  cephALEXin (KEFLEX) 500 MG capsule Take 1 capsule (500 mg total) by mouth 2 (two) times daily for 3 days. To prevent infection with tethered stents. 09/18/20 09/21/20  Alexis Frock, MD  cetirizine (ZYRTEC) 10 MG tablet Take 1 tablet (10 mg total) by mouth daily. Patient taking differently: Take 10 mg by  mouth daily as needed for allergies. 01/28/20   Daisy Floro, DO  Cholecalciferol (DIALYVITE VITAMIN D 5000) 125 MCG (5000 UT) capsule Take 5,000 Units by mouth daily.    [provider]  clobetasol cream (TEMOVATE) 3.24 % Apply 1 application topically 2 (two) times daily as needed. Patient taking differently: Apply 1 application topically 2 (two) times daily as needed (psoriasis). 11/09/17   Mercy Riding, MD  conjugated estrogens (PREMARIN) vaginal cream Place 1 Applicatorful vaginally daily. Patient taking differently: Place 1 Applicatorful vaginally at bedtime. 11/13/19   Kathrene Alu, MD  cyclobenzaprine (FLEXERIL) 10 MG tablet Take 1 tablet (10 mg total) by mouth 2 (two) times daily as needed for muscle spasms. 03/26/19   Kathrene Alu, MD  docusate sodium (COLACE) 100 MG capsule Take 1 capsule (100 mg total) by mouth 2 (two) times daily. Patient not taking: Reported on 09/08/2020 06/23/20   [name], MD  fluticasone [facility]) 50 MCG/ACT nasal spray Place 2 sprays into both nostrils daily. Patient taking differently: Place 2 sprays into both nostrils daily as needed for allergies. 01/28/20   Daisy Floro, DO  gabapentin (NEURONTIN) 300 MG capsule Take 3 capsules (900 mg total) by mouth 3 (three) times daily. Patient taking  differently: Take 300-600 mg by mouth 2 (two) times daily as needed (pain). 11/13/19   Kathrene Alu, MD  hydrOXYzine (ATARAX/VISTARIL) 50 MG tablet Take 0.5 tablets (25 mg total) by mouth at bedtime as needed. Patient taking differently: Take 50 mg by mouth at bedtime as needed for anxiety or itching (sleep). 07/10/20   Martyn Malay, MD  Melatonin 10 MG TBCR Take 10 mg by mouth at bedtime. 06/23/20   [name], MD  nicotine (NICODERM CQ - DOSED IN MG/24 HOURS) 21 mg/24hr patch Place 21 mg onto the skin daily.    [provider]  traMADol (ULTRAM) 50 MG tablet Take 1 tablet (50 mg total) by mouth every 6 (six) hours as needed for moderate pain or severe pain. Post-operatively 09/18/20 09/18/21  Alexis Frock, MD  venlafaxine XR (EFFEXOR-XR) 37.5 MG 24 hr capsule Take 37.5 mg by mouth daily with breakfast.    [provider]    Allergies    Prednisone and Codeine  Review of Systems   Review of Systems  All other systems reviewed and are negative.   Physical Exam Updated Vital Signs BP (!) 155/73   Pulse 74   Temp 97.8 F (36.6 C) (Oral)   Resp 18   Ht 1.727 m (5\' 8" )   Wt 80.3 kg   SpO2 97%   BMI 26.91 kg/m   Physical Exam Vitals and nursing note reviewed.  Constitutional:      General: She is not in acute distress.    Appearance: Normal appearance. She is well-developed and well-nourished. She is not toxic-appearing.  HENT:     Head: Normocephalic and atraumatic.  Eyes:     General: Lids are normal.     Extraocular Movements: EOM normal.     Conjunctiva/sclera: Conjunctivae normal.     Pupils: Pupils are equal, round, and reactive to light.  Neck:     Thyroid: No thyroid mass.     Trachea: No tracheal deviation.  Cardiovascular:     Rate and Rhythm: Normal rate and regular rhythm.     Heart sounds: Normal heart sounds. No murmur heard. No gallop.   Pulmonary:     Effort: Pulmonary effort is normal. No respiratory distress.  Breath  sounds: Normal breath sounds. No stridor. No decreased breath sounds, wheezing, rhonchi or rales.  Abdominal:     General: Bowel sounds are normal. There is no distension.     Palpations: Abdomen is soft.     Tenderness: There is no abdominal tenderness. There is no CVA tenderness or rebound.     Comments: Soft nontender nondistended  Musculoskeletal:        General: No tenderness or edema. Normal range of motion.     Cervical back: Normal range of motion and neck supple.  Skin:    General: Skin is warm and dry.     Findings: No abrasion or rash.  Neurological:     Mental Status: She is alert and oriented to person, place, and time.     GCS: GCS eye subscore is 4. GCS verbal subscore is 5. GCS motor subscore is 6.     Cranial Nerves: No cranial nerve deficit.     Sensory: No sensory deficit.     Deep Tendon Reflexes: Strength normal.  Psychiatric:        Mood and Affect: Mood and affect normal.        Speech: Speech normal.        Behavior: Behavior normal.     ED Results / Procedures / Treatments   Labs (all labs ordered are listed, but only abnormal results are displayed) Labs Reviewed  COMPREHENSIVE METABOLIC PANEL - Abnormal; Notable for the following components:      Result Value   Glucose, Bld 117 (*)    Total Protein 8.2 (*)    All other components within normal limits  CBC - Abnormal; Notable for the following components:   WBC 12.0 (*)    All other components within normal limits  URINALYSIS, ROUTINE W REFLEX MICROSCOPIC - Abnormal; Notable for the following components:   Color, Urine RED (*)    APPearance CLOUDY (*)    Glucose, UA 50 (*)    Hgb urine dipstick LARGE (*)    Protein, ur 100 (*)    Leukocytes,Ua MODERATE (*)    RBC / HPF >50 (*)    Bacteria, UA RARE (*)    All other components within normal limits  LIPASE, BLOOD    EKG None  Radiology DG C-Arm 1-60 Min-No Report  Result Date: 09/18/2020 Fluoroscopy was utilized by the requesting physician.   No radiographic interpretation.    Procedures Procedures (including critical care time)  Medications Ordered in ED Medications  0.9 %  sodium chloride infusion (has no administration in time range)  sodium chloride 0.9 % bolus 500 mL (has no administration in time range)  ondansetron (ZOFRAN) injection 4 mg (has no administration in time range)    ED Course  I have reviewed the triage vital signs and the nursing notes.  Pertinent labs & imaging results that were available during my care of the patient were reviewed by me and considered in my medical decision making (see chart for details).    MDM Rules/Calculators/A&P                         Patient given IV fluids as well as antiemetics and feels better. Labs are reassuring here.  Urinalysis noted and likely from recent procedure.  Renal CT without acute findings.  Patient feels better will be discharged home Final Clinical Impression(s) / ED Diagnoses Final diagnoses:  None    Rx / DC Orders ED Discharge Orders  None       Lacretia Leigh, MD 09/19/20 1252

## 2020-09-19 NOTE — ED Triage Notes (Signed)
Pt reports feeling "weezy and queezy". approx 5-6 episodes of vomiting with last episode around midnight. Pt reports being discharged today earlier today after a procedure on the bladder.

## 2020-09-21 LAB — SURGICAL PATHOLOGY

## 2020-09-21 NOTE — Op Note (Signed)
NAME: [name], [name] MEDICAL RECORD [id number] ACCOUNT [id number] DATE OF BIRTH:1955-10-20 FACILITY: [facility] LOCATION: [facility] PHYSICIAN:THEODORE MANNY, MD  OPERATIVE REPORT  DATE OF PROCEDURE:  09/18/2020  PREOPERATIVE DIAGNOSES:  Bladder cancer, right hydronephrosis.  POSTOPERATIVE DIAGNOSES:  Bladder cancer, right partial UPJ obstruction.  PROCEDURES: 1.  Cystoscopy, bilateral retrograde pyelograms, interpretation. 2.  Right diagnostic ureteroscopy. 3.  Insertion of right ureteral stent, 5 x 24 Polaris with tether. 4.  Transection of bladder tumor, volume medium.  ESTIMATED BLOOD LOSS:  Nil.  COMPLICATIONS:  None.  SPECIMENS: 1.  Bladder tumor. 2.  Base of bladder tumor.  FINDINGS: 1.  Papillary, partially sessile bladder cancer from the right lateral wall, approximately 2 cm lateral to the right ureteral orifice. 2.  Unremarkable left retrograde pyelogram. 3.  Right hydronephrosis without ureteronephrosis, direct visualization verifies partial UPJ obstruction.  No evidence of right ureteral cancer. 4.  Successful placement of right ureteral stent, proximal end in renal pelvis, distal end in urinary bladder.  INDICATIONS:  The patient is a pleasant but unfortunate 65 year old lady with recent history of lung cancer who was found incidentally on staging of this to have a hypermetabolic lesion within the urinary bladder and some hydronephrosis on the right  side.  Cystoscopy in the office corroborated primary bladder cancer.  Options were discussed including recommended path of operative transurethral resection for diagnostic staging and treatment purposes as well as right ureteroscopy, especially to rule  out any neoplasm in the right ureter and she wished to proceed.  Informed consent was obtained and placed in medical record.  DESCRIPTION OF PROCEDURE:  The patient being identified, procedure being transection bladder tumor, bilateral retrogrades, right ureteroscopy was  confirmed.  Procedure timeout was performed.  Intravenous antibiotics were administered.  General LMA  anesthesia induced.  The patient was placed into a low lithotomy position.  Sterile field was created, prepped and draped the patient's vagina, introitus and proximal thigh using iodine.  Cystourethroscopy was performed using 21-French rigid cystoscope  with offset lens.  Inspection of bladder revealed approximately 3 cm partially papillary, partially sessile tumor on the right lateral wall of the bladder.  With close inspection, this was noted to be approximately 2 cm lateral to the right ureteral  orifice, not directly involving it.  This was quite fortunate.  No additional foci of tumor were noted.  Attention was then directed at retrograde pyelograms, first on the left side.  The left ureteral orifice was cannulated with 6-French renal catheter  and left retrograde pyelogram was obtained.  Left retrograde pyelogram demonstrated single left ureter, single system left kidney.  No filling defects or narrowing noted.  Similarly, right retrograde pyelogram was obtained.  Right retrograde pyelogram demonstrated a single right ureter, single system right kidney.  There was hydronephrosis without significant ureteronephrosis.  No obvious filling defects noted.  A 0.03 ZIPwire was advanced to lower pole and set aside as a  safety wire.  An 8-French feeding tube was placed in the urinary bladder for pressure release, and semirigid ureteroscopy performed of the distal four-fifths of the right ureter alongside a separate sensor working wire.  No significant mucosal  abnormalities were found.  Next, the semirigid scope was exchanged for a single channel flexible digital ureteroscope to the level of the renal pelvis.  Using continuous fluoroscopic guidance, flexible digital ureteroscopy of the right kidney and  proximal right ureter revealed no evidence of intraepithelial or intraluminal neoplasia, whatsoever.  In  the area of the right UPJ, there was  a partial narrowing without focal stricturing, consistent with partial UPJ obstruction.  As the patient has  normal parenchyma, normal renal function, this would likely be subclinical.  No indication for any sort of endopyelotomy or procedural intervention.  As such, the ureteroscope was withdrawn and a new 5 x 24 Polaris-type stent was placed  over remaining  safety wire using fluoroscopic guidance.  Good proximal and distal planes were noted.  Attention was then directed at resection of bladder tumor.  Using resectoscope sheath with visual obturator, the resectoscope sheath, was entered the bladder and with  medium size loop, very careful resection was performed of the bladder tumor down to the superficial fibromuscular stroma of the urinary bladder.  Bladder tumor fragments were irrigated and set aside for pathology, labeled as such.  In the base of the  tumor area, at the level of the muscularis, cold cup biopsy forceps were used to obtain representative deep base sample, set aside labeled as base of bladder tumor and this area was fulgurated using resectoscope loop.  Following this, there was excellent  hemostasis, complete resolution of all visible tumor in the urinary bladder.  The right stent remained in good location.  Bladder was empty per cystoscope.  Procedure was then terminated.  The tether was left in place and tucked per vagina from the  right ureteral stent.  The patient tolerated the procedure well and no immediate perioperative complications.  The patient was taken to postanesthesia care unit in stable condition.  Plan for discharge home today.  HN/NUANCE  D:09/18/2020 T:09/19/2020 JOB:[id number]

## 2020-09-23 ENCOUNTER — Ambulatory Visit
Admission: RE | Admit: 2020-09-23 | Discharge: 2020-09-23 | Disposition: A | Discharge status: Home / Self Care | Payer: Medicare Other | Source: Ambulatory Visit | Attending: Radiation Oncology | Admitting: Radiation Oncology

## 2020-09-23 ENCOUNTER — Other Ambulatory Visit: Payer: Self-pay

## 2020-09-23 DIAGNOSIS — Z510 Encounter for antineoplastic radiation therapy: Secondary | ICD-10-CM | POA: Insufficient documentation

## 2020-09-23 DIAGNOSIS — C3432 Malignant neoplasm of lower lobe, left bronchus or lung: Secondary | ICD-10-CM | POA: Insufficient documentation

## 2020-09-23 DIAGNOSIS — Z87891 Personal history of nicotine dependence: Secondary | ICD-10-CM | POA: Insufficient documentation

## 2020-09-29 DIAGNOSIS — Z510 Encounter for antineoplastic radiation therapy: Secondary | ICD-10-CM | POA: Diagnosis not present

## 2020-09-29 DIAGNOSIS — C3432 Malignant neoplasm of lower lobe, left bronchus or lung: Secondary | ICD-10-CM | POA: Diagnosis not present

## 2020-09-29 DIAGNOSIS — Z87891 Personal history of nicotine dependence: Secondary | ICD-10-CM | POA: Diagnosis not present

## 2020-10-06 ENCOUNTER — Ambulatory Visit
Admission: RE | Admit: 2020-10-06 | Discharge: 2020-10-06 | Disposition: A | Discharge status: Home / Self Care | Payer: Medicare Other | Source: Ambulatory Visit | Attending: Radiation Oncology | Admitting: Radiation Oncology

## 2020-10-06 ENCOUNTER — Telehealth: Payer: Self-pay | Admitting: *Deleted

## 2020-10-06 DIAGNOSIS — C678 Malignant neoplasm of overlapping sites of bladder: Secondary | ICD-10-CM | POA: Diagnosis not present

## 2020-10-06 DIAGNOSIS — C3432 Malignant neoplasm of lower lobe, left bronchus or lung: Secondary | ICD-10-CM

## 2020-10-06 DIAGNOSIS — R8271 Bacteriuria: Secondary | ICD-10-CM | POA: Diagnosis not present

## 2020-10-06 DIAGNOSIS — Z510 Encounter for antineoplastic radiation therapy: Secondary | ICD-10-CM | POA: Diagnosis not present

## 2020-10-06 DIAGNOSIS — Z87891 Personal history of nicotine dependence: Secondary | ICD-10-CM | POA: Diagnosis not present

## 2020-10-06 DIAGNOSIS — N130 Hydronephrosis with ureteropelvic junction obstruction: Secondary | ICD-10-CM | POA: Diagnosis not present

## 2020-10-06 NOTE — Telephone Encounter (Signed)
Patient called after her weekly undertreat to advise that she is having some chest pressure shortness of breath.  She has had 1 treatment of radiation.  Advised that one session of treatment shouldn't cause any shortness of breath. Patient advised that she has been to several appts today and received bad news at one of them and now that she talks it through she feels like it might be anxiety provoked.  Advised that if it persists she should follow up with PCP or pulmonologist for further work up.

## 2020-10-07 ENCOUNTER — Encounter: Payer: Self-pay | Admitting: Family Medicine

## 2020-10-07 ENCOUNTER — Ambulatory Visit (INDEPENDENT_AMBULATORY_CARE_PROVIDER_SITE_OTHER): Payer: Medicare Other | Admitting: Family Medicine

## 2020-10-07 ENCOUNTER — Ambulatory Visit: Payer: Medicare Other | Admitting: Radiation Oncology

## 2020-10-07 ENCOUNTER — Other Ambulatory Visit: Payer: Self-pay

## 2020-10-07 VITALS — BP 130/70 | HR 78 | Ht 68.0 in | Wt 174.0 lb

## 2020-10-07 DIAGNOSIS — J439 Emphysema, unspecified: Secondary | ICD-10-CM | POA: Diagnosis not present

## 2020-10-07 DIAGNOSIS — F411 Generalized anxiety disorder: Secondary | ICD-10-CM

## 2020-10-07 MED ORDER — VENLAFAXINE HCL ER 75 MG PO CP24
75.0000 mg | ORAL_CAPSULE | Freq: Every day | ORAL | 0 refills | Status: DC
Start: 2020-10-07 — End: 2021-01-04

## 2020-10-07 MED ORDER — HYDROXYZINE HCL 50 MG PO TABS: 50.0000 mg | ORAL_TABLET | Freq: Every evening | ORAL | 1 refills | Status: DC | PRN

## 2020-10-07 MED ORDER — ANORO ELLIPTA 62.5-25 MCG/INH IN AEPB: 1.0000 | INHALATION_SPRAY | Freq: Every day | RESPIRATORY_TRACT | 0 refills | Status: DC

## 2020-10-07 NOTE — Progress Notes (Signed)
    SUBJECTIVE:   CHIEF COMPLAINT / HPI:   Lung Cancer with Shortness of Breath Reports that if she is rushing, talking has to take a break to catch her breath When she gets upset she has trouble breathing Happening more when she is stressed Also found out that she needs to have another surgery Has her second day of radiation tomorrow Worse when she does the radiation band Has supportive family Sometimes has trouble at night Little coughing since she stopped smoking No fevers or sick contacts Has been vaccinated and boosted for COVID Has never had difficulty with her lungs before and hasn't been diagnosed with COPD or asthma Has a history of anxiety, on effexor 37.5mg   Also uses hydroxyzine as needed for her anxiety, takes it before she goes to radiation treatment Had PFTs in 07/2020, mild obstructive with [name] Last seen on 12/17 and he had noted that she would benefit from daily maintenance inhaler, but declined at that time She would be open to inhaler now Denies SI  PERTINENT  PMH / PSH: Primary adenocarcinoma of the left lower lobe, pulmonary emphysema, anxiety, HLD  OBJECTIVE:   BP 130/70   Pulse 78   Ht 5\' 8"  (1.727 m)   Wt 174 lb (78.9 kg)   SpO2 94%   BMI 26.46 kg/m    Physical Exam:  General: 65 y.o. female in NAD Cardio: RRR no m/r/g Lungs: decreased breath sounds throughout all lung fields, but able to hear lung sounds at bases, no wheezing Skin: warm and dry Extremities: No edema Psych: Mood and affect appropriate for circumstance, appropriate dress, thought process linear and logical, no SI   ASSESSMENT/PLAN:   Anxiety state Patient has a history of anxiety and this is likely worsening in the setting of recent diagnosis of cancer.  This is likely contributing to some of the shortness of breath that she is having, especially since it seems to be very situational.  She has been on Effexor 37.5 mg daily for quite some time, will go ahead and increase  this to 75 mg daily.  We will also continue with hydroxyzine as needed, advised her to take this before her radiation treatments for better toleration.  Discussed therapy, she has had a bad experience in the past, but encouraged her to consider this again.  She was provided a list of therapy resources.  She will follow-up in 1 month with her PCP.  Reassuringly, she denies SI.  Pulmonary emphysema (HCC) Her shortness of breath is likely also from underlying COPD.  She has had pulmonary function test in November and was seen by her pulmonologist [name] on 12/17 when he reported that she would benefit from a daily maintenance inhaler, but she was not open to this at this time.  He did report that she has moderate COPD.  Given that we have samples that are LAMA/LABA in the clinic, we will go ahead and start her on Anoro Ellipta.  She will use this once daily, instructed patient on how to properly use this inhaler.  Hopefully, could decrease her in the future to Spiriva or Incruse Ellipta, but for now we will start with a LAMA/LABA.  We will have her follow-up in 1 month to see how she is doing.  She did not have a reversible component with bronchodilators, would not start her on albuterol at this time.     Cleophas Dunker, [place]

## 2020-10-07 NOTE — Assessment & Plan Note (Signed)
Her shortness of breath is likely also from underlying COPD.  She has had pulmonary function test in November and was seen by her pulmonologist [name] on 12/17 when he reported that she would benefit from a daily maintenance inhaler, but she was not open to this at this time.  He did report that she has moderate COPD.  Given that we have samples that are LAMA/LABA in the clinic, we will go ahead and start her on Anoro Ellipta.  She will use this once daily, instructed patient on how to properly use this inhaler.  Hopefully, could decrease her in the future to Spiriva or Incruse Ellipta, but for now we will start with a LAMA/LABA.  We will have her follow-up in 1 month to see how she is doing.  She did not have a reversible component with bronchodilators, would not start her on albuterol at this time.

## 2020-10-07 NOTE — Assessment & Plan Note (Signed)
Patient has a history of anxiety and this is likely worsening in the setting of recent diagnosis of cancer.  This is likely contributing to some of the shortness of breath that she is having, especially since it seems to be very situational.  She has been on Effexor 37.5 mg daily for quite some time, will go ahead and increase this to 75 mg daily.  We will also continue with hydroxyzine as needed, advised her to take this before her radiation treatments for better toleration.  Discussed therapy, she has had a bad experience in the past, but encouraged her to consider this again.  She was provided a list of therapy resources.  She will follow-up in 1 month with her PCP.  Reassuringly, she denies SI.

## 2020-10-07 NOTE — Patient Instructions (Addendum)
Thank you for coming to see me today. It was a pleasure. Today we talked about:   We have increased your effexor to 75 mg daily.  Take your hydroxyzine before your radiation treatments.  Start taking the anoro ellipta inhaler for your COPD daily.  Please follow-up with PCP in 1 month.  If you have any questions or concerns, please do not hesitate to call the office at [phone].  Best,   [place] Constable, DO   Therapy and Counseling Resources Most providers on this list will take Medicaid. Patients with commercial insurance or Medicare should contact their insurance company to get a list of in network providers.  BestDay:Psychiatry and Counseling [address] [facility] [place]. [place], [place] [address] [place]  [address] [place], [place] [address]      [place] [address]  [place], [place] [address] [phone]  [facility] [place] [address] [place]  [place], [place] [address]       [phone]      Jinny Blossom [facility] [address], [place], [place]  Family Solutions:  [place]. [place] [phone]  Journeys Counseling:  [place] [address] [place] [phone]  [facility] (under & uninsured) [address], [place] [phone]    [email or web address]    [place] [address] . [place]    [phone]  [facility] [place]     Phone:  [phone]     [place]  [place] [address]      [place], [place] ext [place]: [place], [place], [place]   [place] ([place] therapist) [email or web address]  [place] [address]   [place] [address]     [phone]    [facility]   [address],  [place], [place]   [place]  [place]  [facility]  [address] [place], [place]        [phone]  Open Access/Walk In Clinic under & uninsured  [facility]  [address] [place], [place] Crisis [phone]  [facility] of the [place],  ([place])   [place], [place]: [phone]) 8:30 - 12; 1 - 2:30  Family Service of the [place],  [place], [place]    ([phone]):8:30 - 12; 2 - 3PM  [facility] [employer],  [address],  [place]; [phone]):   Mon - Fri 8 AM - 5 PM  [facility] [place]  MWF 12:30 to 3:00 or call to schedule an appointment  [phone]  Specific Provider options Psychology Today  [email or web address] 1. click on find a therapist  2. enter your zip code 3. left side and select or tailor a therapist for your specific need.   [facility] Provider Directory [email or web address]  (Medicaid)   Follow all drop down to find a provider  [place] [phone] or [email or web address] [address], [place], [place] Recovery support and educational   24- Hour Availability:  .  [place] Kitchen  [facility]  . [place], [place] Crisis [phone]  . Family Service of the [employer] [phone]  [facility] Crisis Service  [phone]   . [place]  [phone] (after hours)  . Therapeutic Alternative/Mobile Crisis   [phone]  . [place] National Suicide Hotline  612-497-9265 ([place])  . Call 911 or go to emergency room  . [employer]  [phone]);  [place] and [employer]   . [place]  ACCESS  [phone]); [place], [place], [place], [place], [place], [place], [place]

## 2020-10-08 ENCOUNTER — Ambulatory Visit
Admission: RE | Admit: 2020-10-08 | Discharge: 2020-10-08 | Disposition: A | Discharge status: Home / Self Care | Payer: Medicare Other | Source: Ambulatory Visit | Attending: Radiation Oncology | Admitting: Radiation Oncology

## 2020-10-08 ENCOUNTER — Other Ambulatory Visit: Payer: Self-pay | Admitting: Urology

## 2020-10-08 ENCOUNTER — Other Ambulatory Visit: Payer: Self-pay

## 2020-10-08 DIAGNOSIS — Z510 Encounter for antineoplastic radiation therapy: Secondary | ICD-10-CM | POA: Diagnosis not present

## 2020-10-08 DIAGNOSIS — C3432 Malignant neoplasm of lower lobe, left bronchus or lung: Secondary | ICD-10-CM

## 2020-10-08 DIAGNOSIS — Z87891 Personal history of nicotine dependence: Secondary | ICD-10-CM | POA: Diagnosis not present

## 2020-10-09 ENCOUNTER — Ambulatory Visit: Payer: Medicare Other | Admitting: Radiation Oncology

## 2020-10-12 ENCOUNTER — Ambulatory Visit
Admission: RE | Admit: 2020-10-12 | Discharge: 2020-10-12 | Disposition: A | Discharge status: Home / Self Care | Payer: Medicare Other | Source: Ambulatory Visit | Attending: Radiation Oncology | Admitting: Radiation Oncology

## 2020-10-12 DIAGNOSIS — Z87891 Personal history of nicotine dependence: Secondary | ICD-10-CM | POA: Diagnosis not present

## 2020-10-12 DIAGNOSIS — C3432 Malignant neoplasm of lower lobe, left bronchus or lung: Secondary | ICD-10-CM

## 2020-10-12 DIAGNOSIS — Z510 Encounter for antineoplastic radiation therapy: Secondary | ICD-10-CM | POA: Diagnosis not present

## 2020-10-14 ENCOUNTER — Other Ambulatory Visit: Payer: Self-pay | Admitting: *Deleted

## 2020-10-14 ENCOUNTER — Ambulatory Visit
Admission: RE | Admit: 2020-10-14 | Discharge: 2020-10-14 | Disposition: A | Discharge status: Home / Self Care | Payer: Medicare Other | Source: Ambulatory Visit | Attending: Radiation Oncology | Admitting: Radiation Oncology

## 2020-10-14 DIAGNOSIS — E785 Hyperlipidemia, unspecified: Secondary | ICD-10-CM

## 2020-10-14 DIAGNOSIS — C3432 Malignant neoplasm of lower lobe, left bronchus or lung: Secondary | ICD-10-CM | POA: Diagnosis not present

## 2020-10-14 MED ORDER — ATORVASTATIN CALCIUM 40 MG PO TABS: 40.0000 mg | ORAL_TABLET | Freq: Every day | ORAL | 3 refills | Status: DC

## 2020-10-16 ENCOUNTER — Ambulatory Visit
Admission: RE | Admit: 2020-10-16 | Discharge: 2020-10-16 | Disposition: A | Discharge status: Home / Self Care | Payer: Medicare Other | Source: Ambulatory Visit | Attending: Radiation Oncology | Admitting: Radiation Oncology

## 2020-10-16 ENCOUNTER — Encounter: Payer: Self-pay | Admitting: Radiation Oncology

## 2020-10-16 ENCOUNTER — Other Ambulatory Visit: Payer: Self-pay

## 2020-10-16 DIAGNOSIS — C3432 Malignant neoplasm of lower lobe, left bronchus or lung: Secondary | ICD-10-CM | POA: Diagnosis not present

## 2020-10-16 DIAGNOSIS — Z87891 Personal history of nicotine dependence: Secondary | ICD-10-CM | POA: Diagnosis not present

## 2020-10-19 ENCOUNTER — Other Ambulatory Visit: Payer: Medicare Other

## 2020-10-20 ENCOUNTER — Telehealth: Payer: Self-pay | Admitting: Pulmonary Disease

## 2020-10-20 NOTE — Telephone Encounter (Signed)
Pt states she is returning the call of Patrice.  Not sure what this is for.

## 2020-10-28 NOTE — Telephone Encounter (Signed)
Call made to patient, confirmed DOB. She wanted to know when was her f/u appt with [name]. I made her aware she was to follow up in 6 months from December so we would call or send a letter closer to that time. Voiced understanding.   Nothing further needed at this time.

## 2020-10-29 ENCOUNTER — Other Ambulatory Visit: Payer: Self-pay

## 2020-10-29 ENCOUNTER — Encounter (HOSPITAL_BASED_OUTPATIENT_CLINIC_OR_DEPARTMENT_OTHER): Payer: Self-pay | Admitting: Urology

## 2020-10-29 NOTE — Progress Notes (Addendum)
ADDENDUM:  Reviewed chart w/ Dr Darnell Level. Rose MDA, stated ok to proceed.   Spoke w/ via phone for pre-op interview--- PT Lab needs dos---- [place]              Lab results------ no COVID test ------ 10-31-2020 @ 6580 Arrive at ------- 0930 on 11-04-2020 NPO after MN NO Solid Food.  Clear liquids from MN until--- 0830 Medications to take morning of surgery ----- Effexor, Nexium, Anoro inhaler Diabetic medication ----- n/a Patient Special Instructions ----- n/a Pre-Op special Istructions ----- n/a Patient verbalized understanding of instructions that were given at this phone interview. Patient denies shortness of breath, chest pain, fever, cough at this phone interview.   Anesthesia Review:  Severe  COPD, emphysema, Stage 3 / primary lung cancer LLL completed SBRT 10-16-2020.  Pt states is not on oxygen, can do house chores no sob, with walking / stairs has to stop frequently. States when stops recovers quickly.   PCP: Dr C. Jeani Hawking (lov 08-13-2020 epic) Pulmonology: Dr Valeta Harms Cassell Clement 08-28-2020 and did last PFTs same day in note, epic) Oncologist:  Dr Jerilynn Mages. Mohamed (lov 08-18-2020 Chest x-ray : 07-28-2020 / chest CT 07-24-2020/ PET 08-17-2020 EKG :  none Sleep Study/ CPAP :  NO Blood Thinner/ Instructions Maryjane Hurter Dose: NO ASA / Instructions/ Last Dose : NO

## 2020-10-31 ENCOUNTER — Other Ambulatory Visit (HOSPITAL_COMMUNITY)
Admission: RE | Admit: 2020-10-31 | Discharge: 2020-10-31 | Disposition: A | Discharge status: Home / Self Care | Payer: Medicare Other | Source: Ambulatory Visit | Attending: Urology | Admitting: Urology

## 2020-10-31 DIAGNOSIS — Z20822 Contact with and (suspected) exposure to covid-19: Secondary | ICD-10-CM | POA: Diagnosis not present

## 2020-10-31 DIAGNOSIS — Z01812 Encounter for preprocedural laboratory examination: Secondary | ICD-10-CM | POA: Insufficient documentation

## 2020-10-31 LAB — SARS CORONAVIRUS 2 (TAT 6-24 HRS): SARS Coronavirus 2: NEGATIVE

## 2020-11-04 ENCOUNTER — Ambulatory Visit (HOSPITAL_BASED_OUTPATIENT_CLINIC_OR_DEPARTMENT_OTHER)
Admission: RE | Admit: 2020-11-04 | Discharge: 2020-11-04 | Disposition: A | Discharge status: Home / Self Care | Payer: Medicare Other | Attending: Urology | Admitting: Urology

## 2020-11-04 ENCOUNTER — Other Ambulatory Visit: Payer: Self-pay

## 2020-11-04 ENCOUNTER — Encounter (HOSPITAL_BASED_OUTPATIENT_CLINIC_OR_DEPARTMENT_OTHER)
Admission: RE | Disposition: A | Discharge status: Home / Self Care | Payer: Self-pay | Source: Home / Self Care | Attending: Urology

## 2020-11-04 ENCOUNTER — Ambulatory Visit (HOSPITAL_BASED_OUTPATIENT_CLINIC_OR_DEPARTMENT_OTHER): Payer: Medicare Other | Admitting: Certified Registered Nurse Anesthetist

## 2020-11-04 ENCOUNTER — Encounter (HOSPITAL_BASED_OUTPATIENT_CLINIC_OR_DEPARTMENT_OTHER): Payer: Self-pay | Admitting: Urology

## 2020-11-04 DIAGNOSIS — C3490 Malignant neoplasm of unspecified part of unspecified bronchus or lung: Secondary | ICD-10-CM | POA: Diagnosis not present

## 2020-11-04 DIAGNOSIS — D090 Carcinoma in situ of bladder: Secondary | ICD-10-CM | POA: Diagnosis not present

## 2020-11-04 DIAGNOSIS — Z923 Personal history of irradiation: Secondary | ICD-10-CM | POA: Diagnosis not present

## 2020-11-04 DIAGNOSIS — Z87891 Personal history of nicotine dependence: Secondary | ICD-10-CM | POA: Diagnosis not present

## 2020-11-04 DIAGNOSIS — Z8551 Personal history of malignant neoplasm of bladder: Secondary | ICD-10-CM | POA: Diagnosis not present

## 2020-11-04 DIAGNOSIS — Z6826 Body mass index (BMI) 26.0-26.9, adult: Secondary | ICD-10-CM | POA: Insufficient documentation

## 2020-11-04 DIAGNOSIS — E119 Type 2 diabetes mellitus without complications: Secondary | ICD-10-CM | POA: Diagnosis not present

## 2020-11-04 DIAGNOSIS — E669 Obesity, unspecified: Secondary | ICD-10-CM | POA: Diagnosis not present

## 2020-11-04 DIAGNOSIS — C678 Malignant neoplasm of overlapping sites of bladder: Secondary | ICD-10-CM | POA: Diagnosis not present

## 2020-11-04 DIAGNOSIS — D494 Neoplasm of unspecified behavior of bladder: Secondary | ICD-10-CM | POA: Diagnosis not present

## 2020-11-04 DIAGNOSIS — N1330 Unspecified hydronephrosis: Secondary | ICD-10-CM | POA: Insufficient documentation

## 2020-11-04 DIAGNOSIS — J439 Emphysema, unspecified: Secondary | ICD-10-CM

## 2020-11-04 DIAGNOSIS — E785 Hyperlipidemia, unspecified: Secondary | ICD-10-CM | POA: Diagnosis not present

## 2020-11-04 DIAGNOSIS — C679 Malignant neoplasm of bladder, unspecified: Secondary | ICD-10-CM | POA: Diagnosis present

## 2020-11-04 HISTORY — DX: Other forms of dyspnea: R06.09

## 2020-11-04 HISTORY — PX: TRANSURETHRAL RESECTION OF BLADDER TUMOR: SHX2575

## 2020-11-04 HISTORY — DX: Chronic obstructive pulmonary disease, unspecified: J44.9

## 2020-11-04 HISTORY — DX: Radiodermatitis, unspecified: L58.9

## 2020-11-04 HISTORY — DX: Other seasonal allergic rhinitis: J30.2

## 2020-11-04 HISTORY — DX: Dyspnea, unspecified: R06.00

## 2020-11-04 HISTORY — DX: Gastro-esophageal reflux disease without esophagitis: K21.9

## 2020-11-04 HISTORY — DX: Other intervertebral disc degeneration, lumbar region: M51.36

## 2020-11-04 HISTORY — DX: Other intervertebral disc degeneration, lumbar region without mention of lumbar back pain or lower extremity pain: M51.369

## 2020-11-04 HISTORY — DX: Centrilobular emphysema: J43.2

## 2020-11-04 LAB — POCT I-STAT, CHEM 8
BUN: 24 mg/dL — ABNORMAL HIGH (ref 8–23)
Calcium, Ion: 1.23 mmol/L (ref 1.15–1.40)
Chloride: 107 mmol/L (ref 98–111)
Creatinine, Ser: 1 mg/dL (ref 0.44–1.00)
Glucose, Bld: 86 mg/dL (ref 70–99)
HCT: 42 % (ref 36.0–46.0)
Hemoglobin: 14.3 g/dL (ref 12.0–15.0)
Potassium: 4.3 mmol/L (ref 3.5–5.1)
Sodium: 145 mmol/L (ref 135–145)
TCO2: 28 mmol/L (ref 22–32)

## 2020-11-04 SURGERY — TURBT (TRANSURETHRAL RESECTION OF BLADDER TUMOR)
Anesthesia: General | Site: Bladder

## 2020-11-04 MED ORDER — FENTANYL CITRATE (PF) 100 MCG/2ML IJ SOLN
INTRAMUSCULAR | Status: AC
  Filled 2020-11-04: qty 2

## 2020-11-04 MED ORDER — IOHEXOL 300 MG/ML  SOLN
INTRAMUSCULAR | Status: DC | PRN
  Administered 2020-11-04: 18 mL via URETHRAL

## 2020-11-04 MED ORDER — FENTANYL CITRATE (PF) 100 MCG/2ML IJ SOLN
INTRAMUSCULAR | Status: DC | PRN
  Administered 2020-11-04 (×2): 25 ug via INTRAVENOUS

## 2020-11-04 MED ORDER — PROPOFOL 10 MG/ML IV BOLUS
INTRAVENOUS | Status: AC
  Filled 2020-11-04: qty 20

## 2020-11-04 MED ORDER — SODIUM CHLORIDE 0.9 % IR SOLN
Status: DC | PRN
  Administered 2020-11-04: 1500 mL

## 2020-11-04 MED ORDER — ONDANSETRON HCL 4 MG/2ML IJ SOLN
INTRAMUSCULAR | Status: DC | PRN
  Administered 2020-11-04: 4 mg via INTRAVENOUS

## 2020-11-04 MED ORDER — GENTAMICIN SULFATE 40 MG/ML IJ SOLN
5.0000 mg/kg | INTRAVENOUS | Status: AC
  Administered 2020-11-04: 350 mg via INTRAVENOUS
  Filled 2020-11-04: qty 8.75

## 2020-11-04 MED ORDER — ANORO ELLIPTA 62.5-25 MCG/INH IN AEPB: 1.0000 | INHALATION_SPRAY | Freq: Every day | RESPIRATORY_TRACT | 0 refills | Status: DC

## 2020-11-04 MED ORDER — OXYCODONE HCL 5 MG/5ML PO SOLN: 5.0000 mg | Freq: Once | ORAL | Status: DC | PRN

## 2020-11-04 MED ORDER — ONDANSETRON HCL 4 MG/2ML IJ SOLN
INTRAMUSCULAR | Status: AC
  Filled 2020-11-04: qty 2

## 2020-11-04 MED ORDER — ONDANSETRON HCL 4 MG/2ML IJ SOLN: 4.0000 mg | Freq: Once | INTRAMUSCULAR | Status: DC | PRN

## 2020-11-04 MED ORDER — MIDAZOLAM HCL 2 MG/2ML IJ SOLN
INTRAMUSCULAR | Status: DC | PRN
  Administered 2020-11-04: 2 mg via INTRAVENOUS

## 2020-11-04 MED ORDER — PROPOFOL 10 MG/ML IV BOLUS
INTRAVENOUS | Status: DC | PRN
  Administered 2020-11-04: 160 mg via INTRAVENOUS

## 2020-11-04 MED ORDER — DEXAMETHASONE SODIUM PHOSPHATE 10 MG/ML IJ SOLN
INTRAMUSCULAR | Status: DC | PRN
  Administered 2020-11-04: 10 mg via INTRAVENOUS

## 2020-11-04 MED ORDER — FENTANYL CITRATE (PF) 100 MCG/2ML IJ SOLN: 25.0000 ug | INTRAMUSCULAR | Status: DC | PRN

## 2020-11-04 MED ORDER — TRAMADOL HCL 50 MG PO TABS: 50.0000 mg | ORAL_TABLET | Freq: Four times a day (QID) | ORAL | 0 refills | Status: DC | PRN

## 2020-11-04 MED ORDER — DEXAMETHASONE SODIUM PHOSPHATE 10 MG/ML IJ SOLN
INTRAMUSCULAR | Status: AC
  Filled 2020-11-04: qty 1

## 2020-11-04 MED ORDER — LIDOCAINE HCL (PF) 2 % IJ SOLN
INTRAMUSCULAR | Status: AC
  Filled 2020-11-04: qty 5

## 2020-11-04 MED ORDER — LACTATED RINGERS IV SOLN
INTRAVENOUS | Status: DC
  Administered 2020-11-04: 50 mL via INTRAVENOUS

## 2020-11-04 MED ORDER — LIDOCAINE 2% (20 MG/ML) 5 ML SYRINGE
INTRAMUSCULAR | Status: DC | PRN
  Administered 2020-11-04: 80 mg via INTRAVENOUS

## 2020-11-04 MED ORDER — OXYCODONE HCL 5 MG PO TABS: 5.0000 mg | ORAL_TABLET | Freq: Once | ORAL | Status: DC | PRN

## 2020-11-04 MED ORDER — AMISULPRIDE (ANTIEMETIC) 5 MG/2ML IV SOLN: 10.0000 mg | Freq: Once | INTRAVENOUS | Status: DC | PRN

## 2020-11-04 MED ORDER — MIDAZOLAM HCL 2 MG/2ML IJ SOLN
INTRAMUSCULAR | Status: AC
  Filled 2020-11-04: qty 2

## 2020-11-04 SURGICAL SUPPLY — 23 items
BAG DRAIN URO-CYSTO SKYTR STRL (DRAIN) ×2 IMPLANT
BAG DRN RND TRDRP ANRFLXCHMBR (UROLOGICAL SUPPLIES)
BAG DRN UROCATH (DRAIN) ×1
BAG URINE DRAIN 2000ML AR STRL (UROLOGICAL SUPPLIES) IMPLANT
BAG URINE LEG 500ML (DRAIN) IMPLANT
CATH FOLEY 2WAY SLVR  5CC 22FR (CATHETERS)
CATH FOLEY 2WAY SLVR 30CC 20FR (CATHETERS) IMPLANT
CATH FOLEY 2WAY SLVR 5CC 22FR (CATHETERS) IMPLANT
CATH INTERMIT  6FR 70CM (CATHETERS) ×2 IMPLANT
CLOTH BEACON ORANGE TIMEOUT ST (SAFETY) ×2 IMPLANT
ELECT REM PT RETURN 9FT ADLT (ELECTROSURGICAL)
ELECTRODE REM PT RTRN 9FT ADLT (ELECTROSURGICAL) IMPLANT
EVACUATOR MICROVAS BLADDER (UROLOGICAL SUPPLIES) IMPLANT
GLOVE SURG ENC MOIS LTX SZ7.5 (GLOVE) ×2 IMPLANT
GOWN STRL REUS W/TWL LRG LVL3 (GOWN DISPOSABLE) ×2 IMPLANT
IV NS IRRIG 3000ML ARTHROMATIC (IV SOLUTION) ×2 IMPLANT
KIT TURNOVER CYSTO (KITS) ×2 IMPLANT
LOOP CUT BIPOLAR 24F LRG (ELECTROSURGICAL) ×2 IMPLANT
MANIFOLD NEPTUNE II (INSTRUMENTS) ×2 IMPLANT
PACK CYSTO (CUSTOM PROCEDURE TRAY) ×2 IMPLANT
SYR TOOMEY IRRIG 70ML (MISCELLANEOUS)
SYRINGE TOOMEY IRRIG 70ML (MISCELLANEOUS) IMPLANT
TUBE CONNECTING 12X1/4 (SUCTIONS) ×2 IMPLANT

## 2020-11-04 NOTE — Anesthesia Postprocedure Evaluation (Signed)
Anesthesia Post Note  Patient: [name]  Procedure(s) Performed: RESTAGING TRANSURETHRAL RESECTION OF BLADDER TUMOR (TURBT); BILATERAL RETROGRADE PYELOGRAM (N/A Bladder)     Patient location during evaluation: PACU Anesthesia Type: General Level of consciousness: awake and alert Pain management: pain level controlled Vital Signs Assessment: post-procedure vital signs reviewed and stable Respiratory status: spontaneous breathing, nonlabored ventilation and respiratory function stable Cardiovascular status: blood pressure returned to baseline and stable Postop Assessment: no apparent nausea or vomiting Anesthetic complications: no   No complications documented.  Last Vitals:  Vitals:   11/04/20 1230 11/04/20 1330  BP: 140/79 (!) 155/90  Pulse: 74 82  Resp: (!) 26 16  Temp:  36.8 C  SpO2: 94% 97%    Last Pain:  Vitals:   11/04/20 1330  TempSrc:   PainSc: 0-No pain                 Lidia Collum

## 2020-11-04 NOTE — H&P (Signed)
[name] is an 65 y.o. female.    Chief Complaint: Pre-Op Transurethral Resection of Bladder Tumor and Retrogrades (Restaging)   HPI:   1 - High Grade Bladder Cancer - 3cm Rt wall mass incidetnal on PET-CT 08/2020 during staging of lung cancer. Cysto 08/2020 confirms and Rt UO not visualized.   Recent Course:  09/2020 - T1G3 by TURBT, no muscle in specimen despite separate deep obvious muscle samples sent    2 - Right Hydronephrosis / Partial Rt UPJO - rt mild/moderate hydro without ureteral dilation incidetnal on PET-CT 08/2020. NO stones. No pertinant prior imaging. Cr 1.1. No significant parenchymal thinning. Diagnostic ureteroscopy 09/2020 confirms partial UPJ only, no obstruction or uper tract cancer.   3 - Bacteruria - asymptomatic bacteruria on UA 08/2020. NO fevers, pyelo hospitalizations. UCX strep (?contaminant)   PMH sig for obesity, DM2 (A1c 6), Bilateral Lung CA ( being treated with bilateral lower lung XRT by J. Kinard 2022), 40PY smoker (quit 2021, can walk aroudn block, has to rest up a flight of stairs), No ischemic CV disease / blood thinners. HEr PCP is with [facility].   Today "[name]" is seen to proceed with restaging TURBT / retrogrades for history of high grade bladder caner. No interval fevers. C19 screen negative.     Past Medical History:  Diagnosis Date  . Anxiety   . Arthritis   . Bladder cancer [facility]) 09/2020   urologist--- dr Tresa Moore, incidental finding on PET scan 12/ 2021,  s/p TURBT 09-18-2020 high grade papillary urothelial   . Centrilobular emphysema ([place])   . DDD (degenerative disc disease), lumbar   . Depression   . DOE (dyspnea on exertion)    10-29-2020  per pt can do house work without sob, when walks/ stairs stops frequently  (stated recovers quickly when stops)  . Fibroid 2003  . GERD (gastroesophageal reflux disease)   . Primary adenocarcinoma of lower lobe of left lung [facility]) 07/2020   oncologist--- dr Julien Nordmann---  dx 11/ 2021  bilateral lower lobe masses,  s/p bronchoscopy w/ bx's 07-28-2020 malignancy cells on left ;  completed SBRT bilateral lower lung 10-16-2020 5 fractions  . Radiation-induced dermatitis    on 10-29-2020 pt complaint stomach and chest areas,  completed SBRT 10-16-2020  . Seasonal allergies   . Stage 3 severe COPD by GOLD classification [facility])    pulmonologist--- dr Valeta Harms,  using anoro inhaler daily  . Wears glasses     Past Surgical History:  Procedure Laterality Date  . ANTERIOR CERVICAL DECOMP/DISCECTOMY FUSION  03-08-2003 @ [facility]   C3 --- C6  . ARTHROTOMY Right 11/09/2015   Procedure: RIGHT WRIST DISTAL RADIAL ULNAR JOINT ARTHROTOMY AND DEBRIDEMENT,  AND ;  Surgeon: Iran Planas, MD;  Location: [facility];  Service: Orthopedics;  Laterality: Right;  . BRONCHIAL BIOPSY  07/28/2020   Procedure: BRONCHIAL BIOPSIES;  Surgeon: [name], DO;  Location: [facility];  Service: Pulmonary;;  . BRONCHIAL BRUSHINGS  07/28/2020   Procedure: BRONCHIAL BRUSHINGS;  Surgeon: [name], DO;  Location: [facility];  Service: Pulmonary;;  . BRONCHIAL NEEDLE ASPIRATION BIOPSY  07/28/2020   Procedure: BRONCHIAL NEEDLE ASPIRATION BIOPSIES;  Surgeon: [name], DO;  Location: [facility];  Service: Pulmonary;;  . BRONCHIAL WASHINGS  07/28/2020   Procedure: BRONCHIAL WASHINGS;  Surgeon: [name], DO;  Location: [facility];  Service: Pulmonary;;  . COLONOSCOPY    . CYSTOSCOPY WITH RETROGRADE PYELOGRAM, URETEROSCOPY AND STENT PLACEMENT N/A 09/18/2020  Procedure: CYSTOSCOPY WITH RETROGRADE PYELOGRAM bilateral,URETEROSCOPY AND  STENT PLACEMENT right ureter;  Surgeon: Alexis Frock, MD;  [facility];  Service: Urology;  Laterality: N/A;  1 HR  . HARDWARE REMOVAL Right 07/24/2013   Procedure: RIGHT WRIST HARDWARE REMOVAL, JOINT RELEASE, RIGHT HAND MANIPULATION UNDER ANESTHESIA;  Surgeon: Linna Hoff, MD;  Location: [facility];  Service: Orthopedics;  Laterality: Right;   . OPEN REDUCTION INTERNAL FIXATION (ORIF) DISTAL RADIAL FRACTURE Right 11/08/2012   Procedure: OPEN REDUCTION INTERNAL FIXATION (ORIF) RIGHT DISTAL RADIUS FRACTURE;  Surgeon: Linna Hoff, MD;  Location: [facility];  Service: Orthopedics;  Laterality: Right;  . TRANSURETHRAL RESECTION OF BLADDER TUMOR N/A 09/18/2020   Procedure: TRANSURETHRAL RESECTION OF BLADDER TUMOR (TURBT);  Surgeon: Alexis Frock, MD;  [facility];  Service: Urology;  Laterality: N/A;  . VIDEO BRONCHOSCOPY WITH ENDOBRONCHIAL NAVIGATION N/A 07/28/2020   Procedure: VIDEO BRONCHOSCOPY WITH ENDOBRONCHIAL NAVIGATION;  Surgeon: [name], DO;  Location: [facility];  Service: Pulmonary;  Laterality: N/A;  . WRIST ARTHROPLASTY Right 11/09/2015   Procedure: OR DISTAL ULNAR RESECTION ARTHROPLASTY ;  Surgeon: Iran Planas, MD;  Location: [facility];  Service: Orthopedics;  Laterality: Right;  . WRIST ARTHROSCOPY WITH DEBRIDEMENT Right 07/21/2014   Procedure: RIGHT WRIST DISTAL RADIAL ULNAR JOINT DEBRIDEMENT AND JOINT RELEASE POSSIBLE TENDON INTERPOSITION;  Surgeon: Linna Hoff, MD;  Location: [facility];  Service: Orthopedics;  Laterality: Right;    Family History  Problem Relation Age of Onset  . Hypertension Mother   . Hyperlipidemia Mother   . Early death Sister 17       Drug overdose  . Dementia Father   . Cancer Father        lung  . Parkinson's disease Father   . Hypertension Daughter   . Diabetes Daughter   . Cancer Brother        lung   Social History:  reports that she quit smoking about 3 months ago. Her smoking use included cigarettes. She started smoking about 45 years ago. She has a 50.00 pack-year smoking history. She has never used smokeless tobacco. She reports previous alcohol use. She reports that she does not use drugs.  Allergies:  Allergies  Allergen Reactions  . Prednisone Other (See Comments)    'Made me feel funny inside my head"-pt cannot be specific  . Codeine Nausea And  Vomiting and Other (See Comments)    Hallucinations    No medications prior to admission.    No results found for this or any previous visit (from the past 48 hour(s)). No results found.  Review of Systems  Constitutional: Negative.  Negative for chills and fever.  All other systems reviewed and are negative.   Height 5\' 8"  (1.727 m), weight 78.9 kg. Physical Exam HENT:     Head: Normocephalic.     Nose: Nose normal.  Eyes:     Pupils: Pupils are equal, round, and reactive to light.  Cardiovascular:     Rate and Rhythm: Normal rate.  Pulmonary:     Effort: Pulmonary effort is normal.     Comments: Non-labored on room air.  Abdominal:     Comments: Stable moderate truncal obesity.   Genitourinary:    Comments: No CVAT at present Musculoskeletal:        General: Normal range of motion.     Cervical back: Normal range of motion.  Skin:    General: Skin is warm.  Neurological:  General: No focal deficit present.     Mental Status: She is alert.      Assessment/Plan  Proceed as planned with cysto, retrogrades, TURBT. Risks,  Benefits, alternatives, expected peri-op course discussed previously and reiterated today.   Alexis Frock, MD 11/04/2020, 7:21 AM

## 2020-11-04 NOTE — Anesthesia Procedure Notes (Signed)
Procedure Name: LMA Insertion Date/Time: 11/04/2020 11:32 AM Performed by: Genelle Bal, CRNA Pre-anesthesia Checklist: Patient identified, Emergency Drugs available, Suction available and Patient being monitored Patient Re-evaluated:Patient Re-evaluated prior to induction Oxygen Delivery Method: Circle system utilized Preoxygenation: Pre-oxygenation with 100% oxygen Induction Type: IV induction Ventilation: Mask ventilation without difficulty LMA: LMA inserted LMA Size: 4.0 Number of attempts: 1 Airway Equipment and Method: Bite block Placement Confirmation: positive ETCO2 Tube secured with: Tape Dental Injury: Teeth and Oropharynx as per pre-operative assessment

## 2020-11-04 NOTE — Brief Op Note (Signed)
11/04/2020  11:52 AM  PATIENT:  [name]  65 y.o. female  PRE-OPERATIVE DIAGNOSIS:  BLADDER TUMOR  POST-OPERATIVE DIAGNOSIS:  BLADDER TUMOR  PROCEDURE:  Procedure(s) with comments: RESTAGING TRANSURETHRAL RESECTION OF BLADDER TUMOR (TURBT); BILATERAL RETROGRADE PYELOGRAM (N/A) - 1 HR  SURGEON:  Surgeon(s) and Role:    * Alexis Frock, MD - Primary  PHYSICIAN ASSISTANT:   ASSISTANTS: none   ANESTHESIA:   general  EBL:  minimal   BLOOD ADMINISTERED:none  DRAINS: none   LOCAL MEDICATIONS USED:  NONE  SPECIMEN:  Source of Specimen:  1 - old resection site; 2- base of old resection site  DISPOSITION OF SPECIMEN:  PATHOLOGY  COUNTS:  YES  TOURNIQUET:  * No tourniquets in log *  DICTATION: .Other Dictation: Dictation Number [id number]  PLAN OF CARE: Discharge to home after PACU  PATIENT DISPOSITION:  PACU - hemodynamically stable.   Delay start of Pharmacological VTE agent (>24hrs) due to surgical blood loss or risk of bleeding: not applicable

## 2020-11-04 NOTE — Transfer of Care (Signed)
Immediate Anesthesia Transfer of Care Note  Patient: [name]  Procedure(s) Performed: RESTAGING TRANSURETHRAL RESECTION OF BLADDER TUMOR (TURBT); BILATERAL RETROGRADE PYELOGRAM (N/A Bladder)  Patient Location: PACU  Anesthesia Type:General  Level of Consciousness: awake, alert  and oriented  Airway & Oxygen Therapy: Patient Spontanous Breathing and Patient connected to face mask oxygen  Post-op Assessment: Report given to RN and Post -op Vital signs reviewed and stable  Post vital signs: Reviewed and stable  Last Vitals:  Vitals Value Taken Time  BP 135/75 11/04/20 1201  Temp    Pulse 90 11/04/20 1202  Resp 21 11/04/20 1202  SpO2 99 % 11/04/20 1202  Vitals shown include unvalidated device data.  Last Pain:  Vitals:   11/04/20 0944  TempSrc: Oral  PainSc: 0-No pain      Patients Stated Pain Goal: 3 (90/93/11 2162)  Complications: No complications documented.

## 2020-11-04 NOTE — Op Note (Signed)
NAME: [name], [name] MEDICAL RECORD NO: [id number] ACCOUNT NO: [id number] DATE OF BIRTH: 03-02-1956 FACILITY: [facility] LOCATION: [facility] PHYSICIAN: Alexis Frock, MD  Operative Report   DATE OF PROCEDURE: 11/04/2020  PREOPERATIVE DIAGNOSIS:  History of high-grade bladder cancer.  PROCEDURE PERFORMED: 1.  Cystoscopy, bilateral retrograde pyelograms. 2.  Transection of bladder tumor, volume small.  ESTIMATED BLOOD LOSS:  Nil.  COMPLICATION:  None.  SPECIMEN: 1.  Old resection site. 2.  Basal resection site.  FINDINGS:   1.  Very subtle small volume papillary tissue intertrigone area at old resection site.  Total surface area 1 cm. 2.  Unremarkable bilateral retrograde pyelograms. 3.  Complete resolution of all papillary tissue at old resection site following transurethral resection.  INDICATIONS FOR PROCEDURE:  The patient is a very pleasant 65 year old lady who was found to have incidental bladder lesion unstaging for lung cancer.  She underwent cystoscopy, which corroborated the papillary tumor.  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  She has  now finished up her primary radiation for lung cancer and presents for restaging TURBT given her history of high-grade superficial disease.  Informed consent was obtained and placed in medical record.  DESCRIPTION OF PROCEDURE:  The patient being herself, procedure being transection of bladder tumor, small was confirmed, procedure time-out was performed.  Intravenous antibiotics were administered, general LMA anesthesia induced.  The patient was placed  in the low lithotomy position.  Sterile field was created, prepped and draped the patient's vagina, introitus and proximal thigh using iodine.   Cystourethroscopy was performed using a 21-French rigid cystoscopy with offset lens.  Inspection of the  urinary bladder revealed some very subtle papillary tissue, approximately 8 mm in diameter in the intertrigone region.  Also, some very subtle papillary changes lateral to the right ureteral orifice.  Attention was then directed at the bilateral  retrograde pyelograms.  The left ureteral orifice was cannulated with a 6-French renal catheter and a left retrograde pyelogram was obtained.  Left retrograde pyelogram demonstrated a single left ureter, single system left kidney.  No filling defects are noted.  Similarly, right retrograde pyelogram was obtained.  Right retrograde pyelogram demonstrated a single right ureter, single system right kidney.  There was some mild hydronephrosis without ureteronephrosis that has been previously documented to be a clinically insignificant partial UPJ obstruction.  No  filling defects noted.  Next, cystoscope was exchanged for a 26-French resectoscope sheath with visual obturator and using the resectoscope loop, a very careful resection was performed of the subtle papillary tissue in the intertrigone region at the old resection site.  This  was set aside for pathology.  An additional tissue was taken lateral to the right ureteral orifice similarly envelop together and set aside for permanent pathology.  Next cold cup biopsy forceps were used to obtain representative seromuscular bites from  the deep aspect of the intertrigone region.  A sample was set aside and labeled as base of bladder tumor x2.  Deep bites were not taken into the area close to the right ureteral orifice.  It was felt that this would be dangerous for damaging.  A gentle  coagulation current was applied to these resection sites resulted in excellent hemostasis.  Bilateral ureteral orifices remained visibly patent.  Following this, hemostasis was great.  The  bladder was partially emptied for  cystoscopy.  Procedure was then  terminated.  The patient tolerated the procedure well, no immediate complications. The patient was taken to postanesthesia care unit in stable condition.  Plan for discharge home after void.   PUS D: 11/04/2020 11:57:09 am T: 11/04/2020 2:05:00 pm  JOB: [id number]/ [id number]

## 2020-11-04 NOTE — Discharge Instructions (Signed)
1 - You may have urinary urgency (bladder spasms) and bloody urine on / off for up to 2 weeks. This is normal.  2 - Call MD or go to ER for fever >102, severe pain / nausea / vomiting not relieved by medications, or acute change in medical status  CYSTOSCOPY HOME CARE INSTRUCTIONS  Activity: Rest for the remainder of the day.  Do not drive or operate equipment today.  You may resume normal activities in one to two days as instructed by your physician.   Meals: Drink plenty of liquids and eat light foods such as gelatin or soup this evening.  You may return to a normal meal plan tomorrow.  Return to Work: You may return to work in one to two days or as instructed by your physician.  Special Instructions / Symptoms: Call your physician if any of these symptoms occur:   -persistent or heavy bleeding  -bleeding which continues after first few urination  -large blood clots that are difficult to pass  -urine stream diminishes or stops completely  -fever equal to or higher than 101 degrees Farenheit.  -cloudy urine with a strong, foul odor  -severe pain  Females should always wipe from front to back after elimination.  You may feel some burning pain when you urinate.  This should disappear with time.  Applying moist heat to the lower abdomen or a hot tub bath may help relieve the pain. \  Follow-Up / Date of Return Visit to Your Physician:   Call for an appointment to arrange follow-up.   Post Anesthesia Home Care Instructions  Activity: Get plenty of rest for the remainder of the day. A responsible individual must stay with you for 24 hours following the procedure.  For the next 24 hours, DO NOT: -Drive a car -Paediatric nurse -Drink alcoholic beverages -Take any medication unless instructed by your physician -Make any legal decisions or sign important papers.  Meals: Start with liquid foods such as gelatin or soup. Progress to regular foods as tolerated. Avoid greasy, spicy,  heavy foods. If nausea and/or vomiting occur, drink only clear liquids until the nausea and/or vomiting subsides. Call your physician if vomiting continues.  Special Instructions/Symptoms: Your throat may feel dry or sore from the anesthesia or the breathing tube placed in your throat during surgery. If this causes discomfort, gargle with warm salt water. The discomfort should disappear within 24 hours.

## 2020-11-04 NOTE — Anesthesia Preprocedure Evaluation (Signed)
Anesthesia Evaluation  Patient identified by MRN, date of birth, ID band Patient awake    Reviewed: Allergy & Precautions, NPO status , Patient's Chart, lab work & pertinent test results  History of Anesthesia Complications Negative for: history of anesthetic complications  Airway Mallampati: II  TM Distance: >3 FB Neck ROM: Full    Dental  (+) Teeth Intact   Pulmonary COPD,  COPD inhaler, former smoker,  H/o lung cancer   Pulmonary exam normal        Cardiovascular negative cardio ROS Normal cardiovascular exam     Neuro/Psych Anxiety Depression negative neurological ROS     GI/Hepatic Neg liver ROS, GERD  ,  Endo/Other  negative endocrine ROS  Renal/GU negative Renal ROS   Bladder cancer    Musculoskeletal  (+) Arthritis ,   Abdominal   Peds  Hematology negative hematology ROS (+)   Anesthesia Other Findings   Reproductive/Obstetrics                             Anesthesia Physical Anesthesia Plan  ASA: III  Anesthesia Plan: General   Post-op Pain Management:    Induction: Intravenous  PONV Risk Score and Plan: 3 and Ondansetron, Dexamethasone, Midazolam and Treatment may vary due to age or medical condition  Airway Management Planned: LMA  Additional Equipment: None  Intra-op Plan:   Post-operative Plan: Extubation in OR  Informed Consent: I have reviewed the patients History and Physical, chart, labs and discussed the procedure including the risks, benefits and alternatives for the proposed anesthesia with the patient or authorized representative who has indicated his/her understanding and acceptance.     Dental advisory given  Plan Discussed with:   Anesthesia Plan Comments:         Anesthesia Quick Evaluation

## 2020-11-05 ENCOUNTER — Encounter (HOSPITAL_BASED_OUTPATIENT_CLINIC_OR_DEPARTMENT_OTHER): Payer: Self-pay | Admitting: Urology

## 2020-11-05 LAB — SURGICAL PATHOLOGY

## 2020-11-16 ENCOUNTER — Ambulatory Visit (INDEPENDENT_AMBULATORY_CARE_PROVIDER_SITE_OTHER): Payer: Medicare Other | Admitting: Family Medicine

## 2020-11-16 ENCOUNTER — Encounter: Payer: Self-pay | Admitting: Family Medicine

## 2020-11-16 ENCOUNTER — Other Ambulatory Visit: Payer: Self-pay

## 2020-11-16 DIAGNOSIS — C678 Malignant neoplasm of overlapping sites of bladder: Secondary | ICD-10-CM | POA: Diagnosis not present

## 2020-11-16 DIAGNOSIS — R1013 Epigastric pain: Secondary | ICD-10-CM | POA: Insufficient documentation

## 2020-11-16 DIAGNOSIS — N130 Hydronephrosis with ureteropelvic junction obstruction: Secondary | ICD-10-CM | POA: Diagnosis not present

## 2020-11-16 HISTORY — DX: Epigastric pain: R10.13

## 2020-11-16 MED ORDER — PANTOPRAZOLE SODIUM 40 MG PO TBEC: 40.0000 mg | DELAYED_RELEASE_TABLET | Freq: Every day | ORAL | 3 refills | Status: DC

## 2020-11-16 NOTE — Patient Instructions (Signed)
I think you have reflux.  I sent in an acid reducing medication that should take care of your symptoms. Because you are complicated, I put in a referral to [facility] to make sure I'm not missing anything.  Someone should call to set up the appointment.

## 2020-11-16 NOTE — Progress Notes (Signed)
    SUBJECTIVE:   CHIEF COMPLAINT / HPI:   65 yo with one month history of epigastric and substernal discomfort.  Worse when lying down.  Relieved by being upright.  Worsened by certain foods (hot dogs for example.)  No vomiting, fever, diaphoresis or lightheadedness.  No SOB or cough.  Not exertional.  No change in BMs.  No bleeding.  She is complicated in that she has lung cancer and bladder cancer.  Recent PET scan reviewed.  Remotely seen by GI for screening colonoscopy 9 years ago.   OBJECTIVE:   BP (!) 100/58   Pulse (!) 106   Ht 5\' 8"  (1.727 m)   Wt 176 lb 3.2 oz (79.9 kg)   SpO2 96%   BMI 26.79 kg/m   Lungs clear Cardiac RRR without m or g abd benging  ASSESSMENT/PLAN:   Epigastric pain Sounds like classic GERD.  I don't think it is related to either her lung cancer or her recent bladder cancer.  Empiric PPI.  I will also refer to GI in that she is coming due for her colonoscopy.  Given that she is a high risk patient, they may think EGD is warranted.     Zenia Resides, MD [place]

## 2020-11-16 NOTE — Assessment & Plan Note (Signed)
Sounds like classic GERD.  I don't think it is related to either her lung cancer or her recent bladder cancer.  Empiric PPI.  I will also refer to GI in that she is coming due for her colonoscopy.  Given that she is a high risk patient, they may think EGD is warranted.

## 2020-11-18 ENCOUNTER — Other Ambulatory Visit: Payer: Self-pay

## 2020-11-18 MED ORDER — ESTROGENS, CONJUGATED 0.625 MG/GM VA CREA: 1.0000 | TOPICAL_CREAM | Freq: Every day | VAGINAL | 12 refills | Status: DC

## 2020-11-25 DIAGNOSIS — C678 Malignant neoplasm of overlapping sites of bladder: Secondary | ICD-10-CM | POA: Diagnosis not present

## 2020-11-25 DIAGNOSIS — Z5111 Encounter for antineoplastic chemotherapy: Secondary | ICD-10-CM | POA: Diagnosis not present

## 2020-12-04 DIAGNOSIS — Z5111 Encounter for antineoplastic chemotherapy: Secondary | ICD-10-CM | POA: Diagnosis not present

## 2020-12-04 DIAGNOSIS — C678 Malignant neoplasm of overlapping sites of bladder: Secondary | ICD-10-CM | POA: Diagnosis not present

## 2020-12-11 DIAGNOSIS — R8271 Bacteriuria: Secondary | ICD-10-CM | POA: Diagnosis not present

## 2020-12-11 DIAGNOSIS — C678 Malignant neoplasm of overlapping sites of bladder: Secondary | ICD-10-CM | POA: Diagnosis not present

## 2020-12-11 DIAGNOSIS — Z5111 Encounter for antineoplastic chemotherapy: Secondary | ICD-10-CM | POA: Diagnosis not present

## 2020-12-17 ENCOUNTER — Emergency Department (HOSPITAL_COMMUNITY)
Admission: EM | Admit: 2020-12-17 | Discharge: 2020-12-17 | Disposition: A | Discharge status: Home / Self Care | Payer: Medicare Other | Attending: Emergency Medicine | Admitting: Emergency Medicine

## 2020-12-17 ENCOUNTER — Emergency Department (HOSPITAL_COMMUNITY): Payer: Medicare Other

## 2020-12-17 ENCOUNTER — Telehealth: Payer: Self-pay | Admitting: *Deleted

## 2020-12-17 ENCOUNTER — Telehealth: Payer: Self-pay

## 2020-12-17 ENCOUNTER — Other Ambulatory Visit: Payer: Self-pay

## 2020-12-17 DIAGNOSIS — Z7951 Long term (current) use of inhaled steroids: Secondary | ICD-10-CM | POA: Insufficient documentation

## 2020-12-17 DIAGNOSIS — Z8551 Personal history of malignant neoplasm of bladder: Secondary | ICD-10-CM | POA: Insufficient documentation

## 2020-12-17 DIAGNOSIS — Z96631 Presence of right artificial wrist joint: Secondary | ICD-10-CM | POA: Diagnosis not present

## 2020-12-17 DIAGNOSIS — R0602 Shortness of breath: Secondary | ICD-10-CM | POA: Insufficient documentation

## 2020-12-17 DIAGNOSIS — Z8616 Personal history of COVID-19: Secondary | ICD-10-CM | POA: Diagnosis not present

## 2020-12-17 DIAGNOSIS — Z87891 Personal history of nicotine dependence: Secondary | ICD-10-CM | POA: Diagnosis not present

## 2020-12-17 DIAGNOSIS — R079 Chest pain, unspecified: Secondary | ICD-10-CM | POA: Diagnosis not present

## 2020-12-17 DIAGNOSIS — J441 Chronic obstructive pulmonary disease with (acute) exacerbation: Secondary | ICD-10-CM | POA: Insufficient documentation

## 2020-12-17 DIAGNOSIS — C3490 Malignant neoplasm of unspecified part of unspecified bronchus or lung: Secondary | ICD-10-CM | POA: Diagnosis not present

## 2020-12-17 LAB — CBC WITH DIFFERENTIAL/PLATELET
Abs Immature Granulocytes: 0.03 10*3/uL (ref 0.00–0.07)
Basophils Absolute: 0.1 10*3/uL (ref 0.0–0.1)
Basophils Relative: 1 %
Eosinophils Absolute: 0.2 10*3/uL (ref 0.0–0.5)
Eosinophils Relative: 3 %
HCT: 38.7 % (ref 36.0–46.0)
Hemoglobin: 12.2 g/dL (ref 12.0–15.0)
Immature Granulocytes: 0 %
Lymphocytes Relative: 17 %
Lymphs Abs: 1.3 10*3/uL (ref 0.7–4.0)
MCH: 28 pg (ref 26.0–34.0)
MCHC: 31.5 g/dL (ref 30.0–36.0)
MCV: 88.8 fL (ref 80.0–100.0)
Monocytes Absolute: 0.7 10*3/uL (ref 0.1–1.0)
Monocytes Relative: 9 %
Neutro Abs: 5.3 10*3/uL (ref 1.7–7.7)
Neutrophils Relative %: 70 %
Platelets: 337 10*3/uL (ref 150–400)
RBC: 4.36 MIL/uL (ref 3.87–5.11)
RDW: 13.6 % (ref 11.5–15.5)
WBC: 7.6 10*3/uL (ref 4.0–10.5)
nRBC: 0 % (ref 0.0–0.2)

## 2020-12-17 LAB — TROPONIN I (HIGH SENSITIVITY)
Troponin I (High Sensitivity): 6 ng/L (ref <18)
Troponin I (High Sensitivity): 6 ng/L (ref <18)

## 2020-12-17 LAB — COMPREHENSIVE METABOLIC PANEL
ALT: 20 U/L (ref 0–44)
AST: 17 U/L (ref 15–41)
Albumin: 3.8 g/dL (ref 3.5–5.0)
Alkaline Phosphatase: 73 U/L (ref 38–126)
Anion gap: 8 (ref 5–15)
BUN: 15 mg/dL (ref 8–23)
CO2: 26 mmol/L (ref 22–32)
Calcium: 9 mg/dL (ref 8.9–10.3)
Chloride: 104 mmol/L (ref 98–111)
Creatinine, Ser: 1.06 mg/dL — ABNORMAL HIGH (ref 0.44–1.00)
GFR, Estimated: 58 mL/min — ABNORMAL LOW (ref >60)
Glucose, Bld: 90 mg/dL (ref 70–99)
Potassium: 3.8 mmol/L (ref 3.5–5.1)
Sodium: 138 mmol/L (ref 135–145)
Total Bilirubin: 1 mg/dL (ref 0.3–1.2)
Total Protein: 6.8 g/dL (ref 6.5–8.1)

## 2020-12-17 LAB — BRAIN NATRIURETIC PEPTIDE: B Natriuretic Peptide: 66.1 pg/mL (ref 0.0–100.0)

## 2020-12-17 MED ORDER — ALBUTEROL SULFATE HFA 108 (90 BASE) MCG/ACT IN AERS
2.0000 | INHALATION_SPRAY | Freq: Once | RESPIRATORY_TRACT | Status: AC
  Administered 2020-12-17: 2 via RESPIRATORY_TRACT
  Filled 2020-12-17: qty 6.7

## 2020-12-17 MED ORDER — IOHEXOL 350 MG/ML SOLN
100.0000 mL | Freq: Once | INTRAVENOUS | Status: AC | PRN
  Administered 2020-12-17: 66 mL via INTRAVENOUS

## 2020-12-17 NOTE — ED Provider Notes (Signed)
[facility] EMERGENCY DEPARTMENT Provider Note   CSN: [id number] Arrival date & time: 12/17/20  6063     History Chief Complaint  Patient presents with  . Chest Pain  . Shortness of Breath    [name] is a 65 y.o. female.  The history is provided by the patient.  Shortness of Breath Severity:  Mild Onset quality:  Gradual Timing:  Intermittent Progression:  Waxing and waning Chronicity:  New Context: activity (SOB and chest pain on and off for a few weeks. No cough. )   Relieved by:  Nothing Worsened by:  Nothing Associated symptoms: chest pain   Associated symptoms: no abdominal pain, no claudication, no cough, no diaphoresis, no ear pain, no fever, no headaches, no hemoptysis, no neck pain, no PND, no rash, no sore throat, no sputum production and no vomiting   Risk factors comment:  Lung cancer s/p raditiation, COPD      Past Medical History:  Diagnosis Date  . Anxiety   . Arthritis   . Bladder cancer [facility]) 09/2020   urologist--- dr Tresa Moore, incidental finding on PET scan 12/ 2021,  s/p TURBT 09-18-2020 high grade papillary urothelial   . Centrilobular emphysema ([place])   . DDD (degenerative disc disease), lumbar   . Depression   . DOE (dyspnea on exertion)    10-29-2020  per pt can do house work without sob, when walks/ stairs stops frequently  (stated recovers quickly when stops)  . Fibroid 2003  . GERD (gastroesophageal reflux disease)   . Primary adenocarcinoma of lower lobe of left lung [facility]) 07/2020   oncologist--- dr Julien Nordmann---  dx 11/ 2021 bilateral lower lobe masses,  s/p bronchoscopy w/ bx's 07-28-2020 malignancy cells on left ;  completed SBRT bilateral lower lung 10-16-2020 5 fractions  . Radiation-induced dermatitis    on 10-29-2020 pt complaint stomach and chest areas,  completed SBRT 10-16-2020  . Seasonal allergies   . Stage 3 severe COPD by GOLD classification [facility])    pulmonologist--- dr Valeta Harms,  using anoro inhaler daily  .  Wears glasses     Patient Active Problem List   Diagnosis Date Noted  . Epigastric pain 11/16/2020  . Pulmonary emphysema ([place]) 10/07/2020  . Primary adenocarcinoma of lower lobe of left lung ([place]) 08/18/2020  . Mass of urinary bladder 08/18/2020  . Costochondritis, acute 08/14/2020  . Lesion of left lung 07/28/2020  . Lesion of right lung 07/28/2020  . Abnormal CT lung screening 07/10/2020  . Nocturnal enuresis 06/23/2020  . Leg cramping 11/13/2019  . Menopausal vaginal dryness 11/13/2019  . Elevated serum creatinine 10/22/2019  . Close exposure to COVID-19 virus 05/03/2019  . Constipation 04/09/2019  . Chronic hand pain, right 03/26/2019  . Epidermoid cyst 10/29/2018  . Chronic bilateral low back pain without sciatica 02/08/2018  . Dyslipidemia 12/14/2017  . Fatigue 10/30/2017  . Oral herpes 10/19/2015  . Healthcare maintenance 06/08/2015  . Arthritis of spine 03/27/2015  . Lumbar spine pain 01/09/2015  . Hot flashes, menopausal 02/28/2012  . Anxiety state 02/28/2012  . Restless legs 02/28/2012    Past Surgical History:  Procedure Laterality Date  . ANTERIOR CERVICAL DECOMP/DISCECTOMY FUSION  03-08-2003 @ [facility]   C3 --- C6  . ARTHROTOMY Right 11/09/2015   Procedure: RIGHT WRIST DISTAL RADIAL ULNAR JOINT ARTHROTOMY AND DEBRIDEMENT,  AND ;  Surgeon: Iran Planas, MD;  Location: [place];  Service: Orthopedics;  Laterality: Right;  . BRONCHIAL BIOPSY  07/28/2020   Procedure: BRONCHIAL  BIOPSIES;  Surgeon: [name], DO;  Location: [place] [facility];  Service: Pulmonary;;  . BRONCHIAL BRUSHINGS  07/28/2020   Procedure: BRONCHIAL BRUSHINGS;  Surgeon: [name], DO;  Location: [place] [facility];  Service: Pulmonary;;  . BRONCHIAL NEEDLE ASPIRATION BIOPSY  07/28/2020   Procedure: BRONCHIAL NEEDLE ASPIRATION BIOPSIES;  Surgeon: [name], DO;  Location: [place] [facility];  Service: Pulmonary;;  . BRONCHIAL WASHINGS  07/28/2020   Procedure: BRONCHIAL WASHINGS;  Surgeon: [name], DO;  Location: [place] [facility];  Service: Pulmonary;;  . COLONOSCOPY    . CYSTOSCOPY WITH RETROGRADE PYELOGRAM, URETEROSCOPY AND STENT PLACEMENT N/A 09/18/2020   Procedure: CYSTOSCOPY WITH RETROGRADE PYELOGRAM bilateral,URETEROSCOPY AND  STENT PLACEMENT right ureter;  Surgeon: Alexis Frock, MD;  Location: [facility];  Service: Urology;  Laterality: N/A;  1 HR  . HARDWARE REMOVAL Right 07/24/2013   Procedure: RIGHT WRIST HARDWARE REMOVAL, JOINT RELEASE, RIGHT HAND MANIPULATION UNDER ANESTHESIA;  Surgeon: Linna Hoff, MD;  Location: [place];  Service: Orthopedics;  Laterality: Right;  . OPEN REDUCTION INTERNAL FIXATION (ORIF) DISTAL RADIAL FRACTURE Right 11/08/2012   Procedure: OPEN REDUCTION INTERNAL FIXATION (ORIF) RIGHT DISTAL RADIUS FRACTURE;  Surgeon: Linna Hoff, MD;  Location: [place];  Service: Orthopedics;  Laterality: Right;  . TRANSURETHRAL RESECTION OF BLADDER TUMOR N/A 09/18/2020   Procedure: TRANSURETHRAL RESECTION OF BLADDER TUMOR (TURBT);  Surgeon: Alexis Frock, MD;  Location: [facility];  Service: Urology;  Laterality: N/A;  . TRANSURETHRAL RESECTION OF BLADDER TUMOR N/A 11/04/2020   Procedure: RESTAGING TRANSURETHRAL RESECTION OF BLADDER TUMOR (TURBT); BILATERAL RETROGRADE PYELOGRAM;  Surgeon: Alexis Frock, MD;  Location: [facility];  Service: Urology;  Laterality: N/A;  1 HR  . VIDEO BRONCHOSCOPY WITH ENDOBRONCHIAL NAVIGATION N/A 07/28/2020   Procedure: VIDEO BRONCHOSCOPY WITH ENDOBRONCHIAL NAVIGATION;  Surgeon: [name], DO;  Location: [place];  Service: Pulmonary;  Laterality: N/A;  . WRIST ARTHROPLASTY Right 11/09/2015   Procedure: OR DISTAL ULNAR RESECTION ARTHROPLASTY ;  Surgeon: Iran Planas, MD;  Location: [place];  Service: Orthopedics;  Laterality: Right;  . WRIST ARTHROSCOPY WITH DEBRIDEMENT Right 07/21/2014   Procedure: RIGHT WRIST DISTAL RADIAL ULNAR JOINT DEBRIDEMENT AND JOINT RELEASE POSSIBLE TENDON  INTERPOSITION;  Surgeon: Linna Hoff, MD;  Location: [place];  Service: Orthopedics;  Laterality: Right;     OB History    Gravida  2   Para  2   Term  2   Preterm      AB      Living  2     SAB      IAB      Ectopic      Multiple      Live Births              Family History  Problem Relation Age of Onset  . Hypertension Mother   . Hyperlipidemia Mother   . Early death Sister 1       Drug overdose  . Dementia Father   . Cancer Father        lung  . Parkinson's disease Father   . Hypertension Daughter   . Diabetes Daughter   . Cancer Brother        lung    Social History   Tobacco Use  . Smoking status: Former Smoker    Packs/day: 1.00    Years: 50.00    Pack years: 50.00    Types: Cigarettes    Start date: 04/13/1975  Quit date: 07/14/2020    Years since quitting: 0.4  . Smokeless tobacco: Never Used  Vaping Use  . Vaping Use: Never used  Substance Use Topics  . Alcohol use: Not Currently    Comment: Holidays only  . Drug use: Never    Home Medications Prior to Admission medications   Medication Sig Start Date End Date Taking? Authorizing Provider  acetaminophen (TYLENOL) 650 MG CR tablet Take 1,300 mg by mouth every 8 (eight) hours as needed for pain.   Yes [provider]  Ascorbic Acid (VITAMIN C ADULT GUMMIES PO) Take 1 tablet by mouth daily.   Yes [provider]  atorvastatin (LIPITOR) 40 MG tablet Take 1 tablet (40 mg total) by mouth daily. Patient taking differently: Take 40 mg by mouth at bedtime. 10/14/20  Yes [name], MD  carbamide peroxide (DEBROX) 6.5 % OTIC solution Place 5 drops into both ears 2 (two) times daily. Patient taking differently: Place 5 drops into both ears 2 (two) times daily as needed (ear pain). 06/23/20  Yes [name], MD  cetirizine (ZYRTEC) 10 MG tablet Take 1 tablet (10 mg total) by mouth daily. Patient taking differently: Take 10 mg by mouth daily as needed for allergies.  01/28/20  Yes Milus Banister C, DO  clobetasol cream (TEMOVATE) 7.68 % Apply 1 application topically 2 (two) times daily as needed. Patient taking differently: Apply 1 application topically 2 (two) times daily as needed (psoriasis). 11/09/17  Yes Mercy Riding, MD  conjugated estrogens (PREMARIN) vaginal cream Place 1 Applicatorful vaginally at bedtime. 11/18/20  Yes [name], MD  fluticasone [facility]) 50 MCG/ACT nasal spray Place 2 sprays into both nostrils daily. Patient taking differently: Place 2 sprays into both nostrils daily as needed for allergies. 01/28/20  Yes Milus Banister C, DO  gabapentin (NEURONTIN) 300 MG capsule Take 3 capsules (900 mg total) by mouth 3 (three) times daily. Patient taking differently: Take 300-600 mg by mouth 2 (two) times daily as needed (pain). 11/13/19  Yes Winfrey, Alcario Drought, MD  hydrOXYzine (ATARAX/VISTARIL) 50 MG tablet Take 1 tablet (50 mg total) by mouth at bedtime as needed for anxiety or itching (sleep). 10/07/20  Yes Meccariello, Bernita Raisin, DO  Melatonin 10 MG TBCR Take 10 mg by mouth at bedtime. 06/23/20  Yes [name], MD  ondansetron (ZOFRAN ODT) 8 MG disintegrating tablet Take 1 tablet (8 mg total) by mouth every 8 (eight) hours as needed for nausea or vomiting. 09/19/20  Yes Lacretia Leigh, MD  pantoprazole (PROTONIX) 40 MG tablet Take 1 tablet (40 mg total) by mouth daily. 11/16/20  Yes Hensel, Jamal Collin, MD  traMADol (ULTRAM) 50 MG tablet Take 1 tablet (50 mg total) by mouth every 6 (six) hours as needed for moderate pain or severe pain. Post-operatively 11/04/20 11/04/21 Yes Alexis Frock, MD  venlafaxine XR (EFFEXOR-XR) 75 MG 24 hr capsule Take 1 capsule (75 mg total) by mouth daily with breakfast. 10/07/20  Yes Meccariello, Bernita Raisin, DO  azelastine (OPTIVAR) 0.05 % ophthalmic solution Place 1 drop into both eyes 2 (two) times daily as needed for allergies. 02/18/20   [provider]  cyclobenzaprine (FLEXERIL) 10 MG tablet Take 1 tablet  (10 mg total) by mouth 2 (two) times daily as needed for muscle spasms. Patient not taking: Reported on 12/17/2020 03/26/19   Kathrene Alu, MD  umeclidinium-vilanterol (ANORO ELLIPTA) 62.5-25 MCG/INH AEPB Inhale 1 puff into the lungs daily. Patient not taking: No sig reported 11/04/20   Jeani Hawking,  Barnetta Chapel, MD    Allergies    Prednisone and Codeine  Review of Systems   Review of Systems  Constitutional: Negative for chills, diaphoresis and fever.  HENT: Negative for ear pain and sore throat.   Eyes: Negative for pain and visual disturbance.  Respiratory: Positive for shortness of breath. Negative for cough, hemoptysis and sputum production.   Cardiovascular: Positive for chest pain. Negative for palpitations, claudication and PND.  Gastrointestinal: Negative for abdominal pain and vomiting.  Genitourinary: Negative for dysuria and hematuria.  Musculoskeletal: Negative for arthralgias, back pain and neck pain.  Skin: Negative for color change and rash.  Neurological: Negative for seizures, syncope and headaches.  All other systems reviewed and are negative.   Physical Exam Updated Vital Signs BP (!) 163/71   Pulse 70   Temp 97.7 F (36.5 C) (Oral)   Resp 20   Ht 5\' 8"  (1.727 m)   Wt 79.8 kg   SpO2 96%   BMI 26.76 kg/m   Physical Exam Vitals and nursing note reviewed.  Constitutional:      General: She is not in acute distress.    Appearance: She is well-developed.  HENT:     Head: Normocephalic and atraumatic.  Eyes:     Conjunctiva/sclera: Conjunctivae normal.     Pupils: Pupils are equal, round, and reactive to light.  Cardiovascular:     Rate and Rhythm: Normal rate and regular rhythm.     Pulses:          Radial pulses are 2+ on the right side and 2+ on the left side.     Heart sounds: No murmur heard.   Pulmonary:     Effort: Pulmonary effort is normal. No respiratory distress.     Breath sounds: Wheezing (mild end expiratory) present. No decreased breath  sounds.  Abdominal:     Palpations: Abdomen is soft.     Tenderness: There is no abdominal tenderness.  Musculoskeletal:        General: Normal range of motion.     Cervical back: Normal range of motion and neck supple.     Right lower leg: No edema.     Left lower leg: No edema.  Skin:    General: Skin is warm and dry.     Capillary Refill: Capillary refill takes less than 2 seconds.  Neurological:     General: No focal deficit present.     Mental Status: She is alert.     ED Results / Procedures / Treatments   Labs (all labs ordered are listed, but only abnormal results are displayed) Labs Reviewed  COMPREHENSIVE METABOLIC PANEL - Abnormal; Notable for the following components:      Result Value   Creatinine, Ser 1.06 (*)    GFR, Estimated 58 (*)    All other components within normal limits  CBC WITH DIFFERENTIAL/PLATELET  BRAIN NATRIURETIC PEPTIDE  TROPONIN I (HIGH SENSITIVITY)  TROPONIN I (HIGH SENSITIVITY)    EKG EKG Interpretation  Date/Time:  Thursday December 17 2020 09:00:06 EDT Ventricular Rate:  67 PR Interval:  159 QRS Duration: 116 QT Interval:  408 QTC Calculation: 431 R Axis:   44 Text Interpretation: Sinus rhythm Nonspecific intraventricular conduction delay Anteroseptal infarct, old Confirmed by Ronnald Nian, Kermitt Harjo ([id number]) on 12/17/2020 9:01:46 AM   Radiology CT Angio Chest PE W and/or Wo Contrast  Result Date: 12/17/2020 CLINICAL DATA:  Short of breath for 1 month. History of lung and bladder carcinoma. Patient undergoing radiation therapy for  lung carcinoma. Several weeks of intermittent chest pain and shortness of breath. EXAM: CT ANGIOGRAPHY CHEST WITH CONTRAST TECHNIQUE: Multidetector CT imaging of the chest was performed using the standard protocol during bolus administration of intravenous contrast. Multiplanar CT image reconstructions and MIPs were obtained to evaluate the vascular anatomy. CONTRAST:  67mL OMNIPAQUE IOHEXOL 350 MG/ML SOLN COMPARISON:   07/24/2020 FINDINGS: Cardiovascular: Pulmonary arteries are satisfactorily opacified. There is no evidence of a pulmonary embolism. Heart is normal in size and configuration. No pericardial effusion. No coronary artery calcifications. Great vessels are normal in caliber. Aorta is not opacified minor atherosclerotic calcifications noted along the aortic arch. Mediastinum/Nodes: No neck base or axillary masses or enlarged lymph nodes. Thyroid unremarkable. No mediastinal masses or enlarged lymph nodes. Mildly enlarged infrahilar lymph node on the right, 1.1 cm short axis. Trachea and esophagus are unremarkable. Lungs/Pleura: Cystic and solid lesion, pleural based, left lower lobe, currently measuring 4.4 x 2.2 cm, previously 5.6 x 3.2 cm. Predominantly cystic mass in the right lower lobe with an irregular wall, without convincing change from the prior CT. Moderate centrilobular and paraseptal emphysema. Mild areas of linear scarring or atelectasis most evident in the right upper and middle lobes, stable. No new lung masses or suspicious nodules. No evidence of pneumonia or pulmonary edema. No pleural effusion or pneumothorax. Upper Abdomen: Partly imaged partly imaged upper pole right renal cyst. No acute findings in the visualized upper abdomen. Musculoskeletal: No fracture or acute finding.  No bone lesion. Review of the MIP images confirms the above findings. IMPRESSION: 1. No acute findings within the chest. 2. Cystic and solid pleural base mass in the left lower lobe, mildly decreased in size compared to the prior CT. Stable predominantly cystic mass/lesion in the right lower lobe. No new lung masses or suspicious nodules. 3. Moderate emphysema.  Minor aortic atherosclerosis. Aortic Atherosclerosis (ICD10-I70.0) and Emphysema (ICD10-J43.9). Electronically Signed   By: Lajean Manes M.D.   On: 12/17/2020 12:26   DG Chest Portable 1 View  Result Date: 12/17/2020 CLINICAL DATA:  65 year old female with lung  cancer undergoing radiation. Intermittent chest pain and shortness of breath. EXAM: PORTABLE CHEST 1 VIEW COMPARISON:  Portable chest 07/28/2020 and earlier. FINDINGS: Portable AP semi upright view at 0905 hours. Improved lung volumes and ventilation compared to November. Mediastinal contours remain within normal limits. Asymmetric left perihilar interstitial opacity appears related to the known superior segment left lower lobe lesion. No superimposed pneumothorax, pulmonary edema, pleural effusion, or consolidation. Stable visualized osseous structures. Prior cervical ACDF. Paucity of bowel gas in the upper abdomen. IMPRESSION: Improved lung volumes and ventilation since November. Known reticulonodular lesion in the superior segment left lower lobe. No new cardiopulmonary abnormality identified. Electronically Signed   By: Genevie Ann M.D.   On: 12/17/2020 09:26    Procedures Procedures   Medications Ordered in ED Medications  albuterol (VENTOLIN HFA) 108 (90 Base) MCG/ACT inhaler 2 puff (has no administration in time range)  iohexol (OMNIPAQUE) 350 MG/ML injection 100 mL (66 mLs Intravenous Contrast Given 12/17/20 1204)    ED Course  I have reviewed the triage vital signs and the nursing notes.  Pertinent labs & imaging results that were available during my care of the patient were reviewed by me and considered in my medical decision making (see chart for details).    MDM Rules/Calculators/A&P                          Arbie Cookey  B Searson is a 66 year old female who presents the ED with shortness of breath.  History of bladder and lung cancer.  History of COPD.  Worsening shortness of breath and chest pain over the last several weeks.  Does not appear to be volume overloaded on exam but will check BNP.  EKG shows sinus rhythm.  No ischemic changes.  Will check 2 troponins.  Sounds atypical nature from a cardiac standpoint.  No infectious symptoms.  Some mild end expiratory wheezing.  Could be mild COPD  exacerbation.  Could have PE given her lung cancer history.  Will get chest x-ray and CT scan to rule out PE.  BNP is normal and no signs of volume overload on chest x-ray or CT.  CT scan negative for PE.  Overall improved cancer burden.  Troponin negative x2 and doubt ACS.  No pneumonia.  No significant anemia, electrolyte abnormality otherwise.  Suspect may be a mild COPD exacerbation and will prescribe albuterol.  We will have her follow-up with her primary care doctor.  This chart was dictated using voice recognition software.  Despite best efforts to proofread,  errors can occur which can change the documentation meaning.    Final Clinical Impression(s) / ED Diagnoses Final diagnoses:  Shortness of breath    Rx / DC Orders ED Discharge Orders    None       Lennice Sites, DO 12/17/20 1311

## 2020-12-17 NOTE — Telephone Encounter (Signed)
Patient calls nurse line regarding albuterol inhaler. Patient wanted to make provider aware that she was started on albuterol inhaler from the ED. ED provider asked for patient to call PCP and let them know that she would need this added to her medication list.   Advised patient to call our office when she has about one week left on inhaler to request refill. Patient verbalizes understanding. Patient also has follow up appointment with [name] on 4/21.  FYI to PCP  Talbot Grumbling, RN

## 2020-12-17 NOTE — ED Notes (Signed)
Patient transported to CT 

## 2020-12-17 NOTE — Discharge Instructions (Addendum)
Use albuterol inhaler as needed with 2 puffs every 4 hours.  Follow-up with your primary care doctor.  Please return if symptoms worsen.

## 2020-12-17 NOTE — Telephone Encounter (Signed)
Patient calls c/o CP, SOB and tenderness of her left ribcage.  She denies nausea or arm pain.  Given her history and our lack of appts the rest of the week, advised to go to ED or UC.  Pt appreciative of advice.  To PCP for FYI. Christen Bame, CMA

## 2020-12-17 NOTE — ED Triage Notes (Signed)
Pt with hx of lung cancer, doing radiation therapy, here for a couple weeks of intermittent cnetral chest pain and shortness of breath. Pain and shob worse when lying down.

## 2020-12-18 DIAGNOSIS — C678 Malignant neoplasm of overlapping sites of bladder: Secondary | ICD-10-CM | POA: Diagnosis not present

## 2020-12-18 DIAGNOSIS — R8271 Bacteriuria: Secondary | ICD-10-CM | POA: Diagnosis not present

## 2020-12-21 ENCOUNTER — Other Ambulatory Visit: Payer: Medicare Other

## 2020-12-30 NOTE — Progress Notes (Signed)
SUBJECTIVE:   CHIEF COMPLAINT / HPI:   Hip Abscess - recurrent epidermoid cyst, although much smaller today than the previous photo, taken in 2020 - today approx 1/2 inch in diameter - most recently removed 03/28/2019 by Dr. Gwendlyn Deutscher but seems to be coming back - at that time, diagnosed at epidermoid cyst - also had one on her chest - both cysts removed 03/28/19, hip site required 2 sutures, able to remove both with capsule in entirety - photo of hip cyst from 03/28/19 visit note below:  - prior to 2020, was removed 04/23/12 by Dr. Mingo Amber - 2013 note indicates: "Patient with sore spot on Left side of abdomen.  States that scar has been there for about a year, but it recently became inflamed and painful in past 10 days.  Unclear what caused original scar.  No drainage from site.  Very tender to touch"   PERTINENT  PMH / PSH: primary adenocarcinoma of left lower lobe, cancerous mass of bladder (likely mets 2/2 lung ca), pulmonary emphysema, HLD, chronic bilateral low back pain without sciatica  OBJECTIVE:   BP 118/64   Pulse 82   Ht 5\' 8"  (1.727 m)   Wt 179 lb 6.4 oz (81.4 kg)   SpO2 93%   BMI 27.28 kg/m    PHQ-9:  Depression screen [facility] 2/9 12/31/2020 11/16/2020 10/07/2020  Decreased Interest 0 3 0  Down, Depressed, Hopeless 0 3 0  PHQ - 2 Score 0 6 0  Altered sleeping 0 2 0  Tired, decreased energy 0 2 0  Change in appetite 0 3 0  Feeling bad or failure about yourself  0 3 0  Trouble concentrating 0 3 0  Moving slowly or fidgety/restless 0 3 0  Suicidal thoughts 0 0 0  PHQ-9 Score 0 22 0  Difficult doing work/chores - - -  Some recent data might be hidden     GAD-7:  GAD 7 : Generalized Anxiety Score 02/19/2020 12/11/2015  Nervous, Anxious, on Edge 0 0  Control/stop worrying 1 2  Worry too much - different things 0 2  Trouble relaxing 1 2  Restless 0 0  Easily annoyed or irritable 0 0  Afraid - awful might happen 0 0  Total GAD 7 Score 2 6  Anxiety Difficulty Not  difficult at all -     Physical Exam General: Awake, alert, oriented Cardiovascular: Regular rate and rhythm, S1 and S2 present, no murmurs auscultated Respiratory: Lung fields clear to auscultation bilaterally Abdomen: 0.5" skin-colored lesion with central black dimple on left hip at iliac crest approx 2" anterior to midaxillary line, no surrounds erythema/swelling/warmth  PROCEDURE: Cyst removal with punch biopsy  Consent signed and scanned into record.  Pain control: 5 mL lidocaine 1% with epi Preparation: area cleansed with alcohol Time out taken Cyst diameter and depth appreciated by thorough palpation.  Skin cleaned thoroughly with alcohol wipe.  5 mL lidocaine 1% with epi injected in circumferential pattern and also underneath cyst.  Punch biopsy inserted directly over central black dimple of epidermoid cyst; punch biopsy advanced fully then removed.  Pickups used to remove biopsy area.  This did not include entire cyst capsule.  Forceps introduced to appreciate system break-up lesions.  Capsule was able to be removed in entirety, although piecemeal.  Patient tolerated this well without bleeding or paresthesias.  Suture not indicated for incision site this small.  ASSESSMENT/PLAN:   Epidermoid cyst Removed with punch biopsy technique as described in procedure note above.  Patient tolerated well. No suture indicated. After care instructions provided. Given return precautions for signs of infection.   Constipation Likely due to current cancer treatments. Prescribed miralax, senna.   Pulmonary emphysema ([place]) Patient feels that albuterol inhaler helped with her symptoms of shortness of breath. Asked for refill. Will send for PFT with Dr. Valentina Lucks.      [name], [place]

## 2020-12-30 NOTE — Patient Instructions (Addendum)
It was wonderful to see you today. Thank you for allowing me to be a part of your care. Below is a short summary of what we discussed at your visit today:  Cyst on hip Today you were seen for a cyst on your hip that seems to be coming back.    GERD (acid reflux) At your last appointment, you were seen by Dr. Andria Frames, diagnosed with acid reflux, and started on a medicine called protonix.   Dr. Andria Frames also referred you to a GI specialist. Because of your complicated cancer condition and this stomach pain, the GI doctor may want to do an upper scope in addition to your colonoscopy when it comes time to have that done later in the year.   DEXA scan for bone density It looks like the DEXA scan previously scheduled for May was cancelled. Please let us know if we can do anything to facilitate this appointment. Normally, you simply have to call the imaging center directly to schedule your scan.    Please bring all of your medications to every appointment!  If you have any questions or concerns, please do not hesitate to contact us via phone or MyChart message.   [name], MD   Wound Care, Adult Taking care of your wound properly can help to prevent pain, infection, and scarring. It can also help your wound heal more quickly. Follow instructions from your health care provider about how to care for your wound. Supplies needed:  Soap and water.  Wound cleanser.  Gauze.  If needed, a clean bandage (dressing) or other type of wound dressing material to cover or place in the wound. Follow your health care provider's instructions about what dressing supplies to use.  Cream or ointment to apply to the wound, if told by your health care provider. How to care for your wound Cleaning the wound Ask your health care provider how to clean the wound. This may include:  Using mild soap and water or a wound cleanser.  Using a clean gauze to pat the wound dry after cleaning it. Do not rub or scrub  the wound. Dressing care  Wash your hands with soap and water for at least 20 seconds before and after you change the dressing. If soap and water are not available, use hand sanitizer.  Change your dressing as told by your health care provider. This may include: ? Cleaning or rinsing out (irrigating) the wound. ? Placing a dressing over the wound or in the wound (packing). ? Covering the wound with an outer dressing.  Leave any stitches (sutures), skin glue, or adhesive strips in place. These skin closures may need to stay in place for 2 weeks or longer. If adhesive strip edges start to loosen and curl up, you may trim the loose edges. Do not remove adhesive strips completely unless your health care provider tells you to do that.  Ask your health care provider when you can leave the wound uncovered. Checking for infection Check your wound area every day for signs of infection. Check for:  More redness, swelling, or pain.  Fluid or blood.  Warmth.  Pus or a bad smell.   Follow these instructions at home Medicines  If you were prescribed an antibiotic medicine, cream, or ointment, take or apply it as told by your health care provider. Do not stop using the antibiotic even if your condition improves.  If you were prescribed pain medicine, take it 30 minutes before you do any wound  care or as told by your health care provider.  Take over-the-counter and prescription medicines only as told by your health care provider. Eating and drinking  Eat a diet that includes protein, vitamin A, vitamin C, and other nutrient-rich foods to help the wound heal. ? Foods rich in protein include meat, fish, eggs, dairy, beans, and nuts. ? Foods rich in vitamin A include carrots and dark green, leafy vegetables. ? Foods rich in vitamin C include citrus fruits, tomatoes, broccoli, and peppers.  Drink enough fluid to keep your urine pale yellow. General instructions  Do not take baths, swim, use a  hot tub, or do anything that would put the wound underwater until your health care provider approves. Ask your health care provider if you may take showers. You may only be allowed to take sponge baths.  Do not scratch or pick at the wound. Keep it covered as told by your health care provider.  Return to your normal activities as told by your health care provider. Ask your health care provider what activities are safe for you.  Protect your wound from the sun when you are outside for the first 6 months, or for as long as told by your health care provider. Cover up the scar area or apply sunscreen that has an SPF of at least 36.  Do not use any products that contain nicotine or tobacco, such as cigarettes, e-cigarettes, and chewing tobacco. These may delay wound healing. If you need help quitting, ask your health care provider.  Keep all follow-up visits as told by your health care provider. This is important. Contact a health care provider if:  You received a tetanus shot and you have swelling, severe pain, redness, or bleeding at the injection site.  Your pain is not controlled with medicine.  You have any of these signs of infection: ? More redness, swelling, or pain around the wound. ? Fluid or blood coming from the wound. ? Warmth coming from the wound. ? Pus or a bad smell coming from the wound. ? A fever or chills.  You are nauseous or you vomit.  You are dizzy. Get help right away if:  You have a red streak of skin near the area around your wound.  Your wound has been closed with staples, sutures, skin glue, or adhesive strips and it begins to open up and separate.  Your wound is bleeding, and the bleeding does not stop with gentle pressure.  You have a rash.  You faint.  You have trouble breathing. These symptoms may represent a serious problem that is an emergency. Do not wait to see if the symptoms will go away. Get medical help right away. Call your local emergency  services (911 in the [place] Do not drive yourself to the hospital. Summary  Always wash your hands with soap and water for at least 20 seconds before and after changing your dressing.  Change your dressing as told by your health care provider.  To help with healing, eat foods that are rich in protein, vitamin A, vitamin C, and other nutrients.  Check your wound every day for signs of infection. Contact your health care provider if you suspect that your wound is infected. This information is not intended to replace advice given to you by your health care provider. Make sure you discuss any questions you have with your health care provider. Document Revised: 06/14/2019 Document Reviewed: 06/14/2019 Elsevier Patient Education  2021 [employer].

## 2020-12-31 ENCOUNTER — Other Ambulatory Visit: Payer: Self-pay

## 2020-12-31 ENCOUNTER — Ambulatory Visit (INDEPENDENT_AMBULATORY_CARE_PROVIDER_SITE_OTHER): Payer: Medicare Other | Admitting: Family Medicine

## 2020-12-31 ENCOUNTER — Encounter: Payer: Self-pay | Admitting: Family Medicine

## 2020-12-31 VITALS — BP 118/64 | HR 82 | Ht 68.0 in | Wt 179.4 lb

## 2020-12-31 DIAGNOSIS — J439 Emphysema, unspecified: Secondary | ICD-10-CM | POA: Diagnosis not present

## 2020-12-31 DIAGNOSIS — Z0000 Encounter for general adult medical examination without abnormal findings: Secondary | ICD-10-CM

## 2020-12-31 DIAGNOSIS — C3432 Malignant neoplasm of lower lobe, left bronchus or lung: Secondary | ICD-10-CM

## 2020-12-31 DIAGNOSIS — N951 Menopausal and female climacteric states: Secondary | ICD-10-CM | POA: Diagnosis not present

## 2020-12-31 DIAGNOSIS — K5900 Constipation, unspecified: Secondary | ICD-10-CM | POA: Diagnosis not present

## 2020-12-31 DIAGNOSIS — R1013 Epigastric pain: Secondary | ICD-10-CM

## 2020-12-31 DIAGNOSIS — L720 Epidermal cyst: Secondary | ICD-10-CM

## 2020-12-31 DIAGNOSIS — K219 Gastro-esophageal reflux disease without esophagitis: Secondary | ICD-10-CM

## 2020-12-31 MED ORDER — SENNA 8.6 MG PO TABS: 1.0000 | ORAL_TABLET | Freq: Every day | ORAL | 0 refills | Status: DC | PRN

## 2020-12-31 MED ORDER — ALBUTEROL SULFATE HFA 108 (90 BASE) MCG/ACT IN AERS: 2.0000 | INHALATION_SPRAY | Freq: Four times a day (QID) | RESPIRATORY_TRACT | 0 refills | Status: DC | PRN

## 2020-12-31 MED ORDER — POLYETHYLENE GLYCOL 3350 17 GM/SCOOP PO POWD: 17.0000 g | Freq: Every day | ORAL | 1 refills | Status: DC

## 2021-01-01 DIAGNOSIS — C678 Malignant neoplasm of overlapping sites of bladder: Secondary | ICD-10-CM | POA: Diagnosis not present

## 2021-01-01 DIAGNOSIS — Z5111 Encounter for antineoplastic chemotherapy: Secondary | ICD-10-CM | POA: Diagnosis not present

## 2021-01-03 NOTE — Assessment & Plan Note (Signed)
Patient feels that albuterol inhaler helped with her symptoms of shortness of breath. Asked for refill. Will send for PFT with Dr. Valentina Lucks.

## 2021-01-03 NOTE — Assessment & Plan Note (Signed)
Likely due to current cancer treatments. Prescribed miralax, senna.

## 2021-01-03 NOTE — Assessment & Plan Note (Signed)
Removed with punch biopsy technique as described in procedure note above. Patient tolerated well. No suture indicated. After care instructions provided. Given return precautions for signs of infection.

## 2021-01-04 ENCOUNTER — Other Ambulatory Visit: Payer: Self-pay | Admitting: Family Medicine

## 2021-01-04 DIAGNOSIS — F411 Generalized anxiety disorder: Secondary | ICD-10-CM

## 2021-01-07 ENCOUNTER — Encounter: Payer: Self-pay | Admitting: Pharmacist

## 2021-01-07 ENCOUNTER — Ambulatory Visit (INDEPENDENT_AMBULATORY_CARE_PROVIDER_SITE_OTHER): Payer: Medicare Other | Admitting: Pharmacist

## 2021-01-07 ENCOUNTER — Other Ambulatory Visit: Payer: Self-pay

## 2021-01-07 DIAGNOSIS — J439 Emphysema, unspecified: Secondary | ICD-10-CM | POA: Diagnosis not present

## 2021-01-07 DIAGNOSIS — C3432 Malignant neoplasm of lower lobe, left bronchus or lung: Secondary | ICD-10-CM

## 2021-01-07 MED ORDER — FLUTICASONE PROPIONATE 50 MCG/ACT NA SUSP: 2.0000 | Freq: Every day | NASAL | 6 refills | Status: DC

## 2021-01-07 MED ORDER — CETIRIZINE HCL 10 MG PO TABS: 10.0000 mg | ORAL_TABLET | Freq: Every day | ORAL | 6 refills | Status: DC

## 2021-01-07 MED ORDER — ANORO ELLIPTA 62.5-25 MCG/INH IN AEPB: 1.0000 | INHALATION_SPRAY | Freq: Every day | RESPIRATORY_TRACT | 0 refills | Status: DC

## 2021-01-07 NOTE — Assessment & Plan Note (Signed)
Breathing has been fairly well controlled relative to comorbid conditions. Patient does not indicate SOB impacts her daily life, only when she is walking long distances, climbing stairs, or emotionally distraught. This SOB is relieved with a moment of rest. Her resting Oxygen saturation is > 95%.  Patient has been noncompliant with Anoro Ellipta due to running out of this prescription approximately one month ago. She has used her albuterol as needed, stating she has used it 4-5 times in the past 7 days. Patient also indicated her allergies contribute to her SOB and request refills on Flonase (fluticasone) and Zyrtec (cetirizine).  Patient is planning follow up with pulmonologist in July for further lung evaluation.  - Continue Anoro Ellipta as previously prescribed. Provided patient with enough medication samples to last until pulmonologist evaluation in July. Will defer PFT and further lung work up to [name].  - Educated patient on purpose, proper use, potential adverse effects  - Counseled patient to contact Dr. Valentina Lucks if she runs out of medication prior to appointment with [name]  - Provided refills for Flonase (fluticasone) and Zyrtec (cetirizine) to preferred pharmacy

## 2021-01-07 NOTE — Progress Notes (Signed)
   S:    Patient arrives to appointment visually emotional and without assistance. Patient endorses she has had a tough year. Presents for lung function evaluation status post lung cancer diagnosis. Patient indicates she has stopped smoking and quit completely in January 2022 after cancer diagnosis and is proud of this accomplishment. Patient indicates she did have radiation therapy as part of cancer treatment and continues to follow with oncology and pulmonology. Planning to see [name] in July and endorses upcoming cancer treatment appointment next week. Patient indicates she does not have a history of asthma or lung issues prior to cancer diagnosis, but does have a 50 pack-years smoking history.   Patient was referred and last seen by Primary Care Provider on 12/31/20.   Patient reports breathing has been ok. Patient endorses SOB most notably when she is emotionally upset or climbing up a lot of stairs. Her daily activity is not impacted by her breathing and she is able to perform most tasks without significant SOB. Any SOB symptoms are improved after a few minutes of rest.  Patient reports adherence to medications has been suboptimal. She indicates she ran out of her Anoro Ellipta last month at some point. She says her breathing has been worse since she ran out.   Patient reports last dose of asthma medications was this morning (albuterol).  Current asthma medications: Ventolin 108 MCG, Anoro Ellipta 62.5-25 MCG Rescue inhaler use frequency: 4-5 times in the past 7 days    O: Physical Exam Constitutional:      Appearance: Normal appearance.  Neurological:     Mental Status: She is alert.    Review of Systems  Respiratory: Positive for shortness of breath.      A/P: Breathing has been fairly well controlled relative to comorbid conditions. Patient does not indicate SOB impacts her daily life, only when she is walking long distances, climbing stairs, or emotionally distraught. This  SOB is relieved with a moment of rest. Her resting Oxygen saturation is > 95%.  Patient has been noncompliant with Anoro Ellipta due to running out of this prescription approximately one month ago. She has used her albuterol as needed, stating she has used it 4-5 times in the past 7 days. Patient also indicated her allergies contribute to her SOB and request refills on Flonase (fluticasone) and Zyrtec (cetirizine).  Patient is planning follow up with pulmonologist in July for further lung evaluation.  - Continue Anoro Ellipta as previously prescribed. Provided patient with enough medication samples to last until pulmonologist evaluation in July. Will defer PFT and further lung work up to [name].  - Educated patient on purpose, proper use, potential adverse effects  - Counseled patient to contact Dr. Valentina Lucks if she runs out of medication prior to appointment with [name]  - Provided refills for Flonase (fluticasone) and Zyrtec (cetirizine) to preferred pharmacy     Patient verbalized understanding of results and education.  Written pt instructions provided.   Recommended F/U PCP Clinic visit with [name] for any immediate concerns.   Total time in face to face counseling 45 minutes.  Patient seen with Dr. Janeann Forehand, PharmD, Dr. Cephus Slater, PharmD, Inis Sizer, PharmD Student

## 2021-01-07 NOTE — Patient Instructions (Addendum)
It was a pleasure to see you today! Congratulations on your choice to stop smoking. Today we provided you with samples of your Anoro Ellipta. Please continue to take all of your medications as prescribed. Remember to breath as deep as possible when you inhale from this device. Please give [place] a call if you run out of this medication before your appointment with the lung doctors in July. We recommend following up with [name] should you need anything before then.

## 2021-01-11 NOTE — Progress Notes (Signed)
Reviewed: I agree with Dr. Koval's documentation and management. 

## 2021-01-12 ENCOUNTER — Ambulatory Visit
Admission: RE | Admit: 2021-01-12 | Discharge: 2021-01-12 | Disposition: A | Discharge status: Home / Self Care | Payer: Medicare Other | Source: Ambulatory Visit | Attending: Family Medicine | Admitting: Family Medicine

## 2021-01-12 ENCOUNTER — Other Ambulatory Visit: Payer: Self-pay

## 2021-01-12 ENCOUNTER — Other Ambulatory Visit: Payer: Medicare Other

## 2021-01-12 DIAGNOSIS — Z5111 Encounter for antineoplastic chemotherapy: Secondary | ICD-10-CM | POA: Diagnosis not present

## 2021-01-12 DIAGNOSIS — Z0000 Encounter for general adult medical examination without abnormal findings: Secondary | ICD-10-CM

## 2021-01-12 DIAGNOSIS — C678 Malignant neoplasm of overlapping sites of bladder: Secondary | ICD-10-CM | POA: Diagnosis not present

## 2021-01-12 DIAGNOSIS — E559 Vitamin D deficiency, unspecified: Secondary | ICD-10-CM

## 2021-01-12 DIAGNOSIS — M85852 Other specified disorders of bone density and structure, left thigh: Secondary | ICD-10-CM | POA: Diagnosis not present

## 2021-01-12 DIAGNOSIS — Z87891 Personal history of nicotine dependence: Secondary | ICD-10-CM

## 2021-01-12 DIAGNOSIS — F17200 Nicotine dependence, unspecified, uncomplicated: Secondary | ICD-10-CM

## 2021-01-12 DIAGNOSIS — Z780 Asymptomatic menopausal state: Secondary | ICD-10-CM | POA: Diagnosis not present

## 2021-01-13 ENCOUNTER — Other Ambulatory Visit: Payer: Self-pay | Admitting: Family Medicine

## 2021-01-13 ENCOUNTER — Encounter: Payer: Self-pay | Admitting: Family Medicine

## 2021-01-13 DIAGNOSIS — M8580 Other specified disorders of bone density and structure, unspecified site: Secondary | ICD-10-CM | POA: Insufficient documentation

## 2021-01-13 DIAGNOSIS — Z780 Asymptomatic menopausal state: Secondary | ICD-10-CM | POA: Insufficient documentation

## 2021-01-13 NOTE — Progress Notes (Signed)
Letter for DEXA showing osteopenia.

## 2021-01-19 ENCOUNTER — Other Ambulatory Visit: Payer: Self-pay | Admitting: Family Medicine

## 2021-01-19 ENCOUNTER — Encounter: Payer: Self-pay | Admitting: Family Medicine

## 2021-01-19 DIAGNOSIS — R52 Pain, unspecified: Secondary | ICD-10-CM

## 2021-01-19 DIAGNOSIS — Y842 Radiological procedure and radiotherapy as the cause of abnormal reaction of the patient, or of later complication, without mention of misadventure at the time of the procedure: Secondary | ICD-10-CM

## 2021-01-19 MED ORDER — DICLOFENAC SODIUM 1 % EX GEL: 2.0000 g | Freq: Four times a day (QID) | CUTANEOUS | 2 refills | Status: DC

## 2021-01-19 NOTE — Progress Notes (Signed)
Called patient to discuss recent referral to GI for colonoscopy a year early due to GERD symptoms and concurrent cancer diagnosis. She relays that she initially misunderstood what the GI office was calling about. She is amenable to GI referral now instead of a year from now when her colonoscopy is due. Discussed she may need upper endoscopy given new GERD symptoms and current treatment for lung cancer.   Ms. Hietala also relays that she has been experiencing bilateral anterior rib pain underneath her breasts (L worse than R) after her final radiation treatment on 5/2 for lung cancer. Discussed that reaching out to her oncologist would be the best option, as they would know more about prescribing something specifically for radiation-related pain, such as a corticosteroid course. I did prescribe Voltaren gel for her tonight, as the oncology office is already closed.   [name], MD

## 2021-01-20 ENCOUNTER — Telehealth: Payer: Self-pay | Admitting: Pulmonary Disease

## 2021-01-20 ENCOUNTER — Encounter: Payer: Self-pay | Admitting: Pulmonary Disease

## 2021-01-20 ENCOUNTER — Ambulatory Visit (INDEPENDENT_AMBULATORY_CARE_PROVIDER_SITE_OTHER): Payer: Medicare Other | Admitting: Pulmonary Disease

## 2021-01-20 ENCOUNTER — Other Ambulatory Visit: Payer: Self-pay

## 2021-01-20 VITALS — BP 116/68 | HR 71 | Temp 97.8°F | Ht 68.0 in | Wt 177.5 lb

## 2021-01-20 DIAGNOSIS — Z87891 Personal history of nicotine dependence: Secondary | ICD-10-CM

## 2021-01-20 DIAGNOSIS — R918 Other nonspecific abnormal finding of lung field: Secondary | ICD-10-CM | POA: Diagnosis not present

## 2021-01-20 DIAGNOSIS — R071 Chest pain on breathing: Secondary | ICD-10-CM | POA: Diagnosis not present

## 2021-01-20 DIAGNOSIS — J432 Centrilobular emphysema: Secondary | ICD-10-CM

## 2021-01-20 DIAGNOSIS — C3492 Malignant neoplasm of unspecified part of left bronchus or lung: Secondary | ICD-10-CM | POA: Diagnosis not present

## 2021-01-20 NOTE — Telephone Encounter (Signed)
Spoke with the pt  She states having pain under left breast x 3 wks now  Pain can be sharp or dull "feels tender all the time"- worse sometimes with taking a deep breath  She is not having any increased SOB, cough, wheezing, f/c/s  Appt with Dr Valeta Harms scheduled for 4:30 today

## 2021-01-20 NOTE — Progress Notes (Signed)
 Synopsis: Referred in November 2021 for abnormal CT chest .  By Lynn, [name], MD  Subjective:   PATIENT ID: [name] GENDER: female DOB: 06/17/1956, MRN: [id number]  Chief Complaint  Patient presents with  . Acute Visit    L sided chest discomfort X1 month    this is a 65-year-old female, past medical history of depression, anxiety, current tobacco abuse.  She initially seen by her primary care provider which encouraged her to have a lung cancer screening CT.  She had this completed in October 2021.  The CT scan revealed bilateral lower lobe cystic peripheral based lesions both greater than 4 cm in size concerning for multifocal adenocarcinoma.  Patient has no respiratory symptoms at this time.  She is a current smoker.  She has smoked for 41 years at approximately 1 pack/day.  She presents today in the office with her husband and her daughter via phone.  Long discussion today regarding CT imaging as well as next appropriate steps.  Patient denies fevers chills night sweats weight loss or hemoptysis.  OV 08/28/2020: Patient here today for follow-up after recent bronchoscopy.  Patient has a primary left lower lobe adenocarcinoma and likely has a carcinoma in the left lower lobe however tissue biopsy was inconclusive on this side both are very irregular cystic shaped lesions concerning for multifocal adenocarcinoma.  PET scan was relatively equivocal with him low to moderate SUV uptake.  Patient was referred to thoracic surgery.  Case was discussed with Dr. Lightfoot today because she was seen earlier today in the office.  She is also been referred for evaluation of an irregular shaped bladder lesion that was found on PET scan.  Pulmonary function tests have also been completed during this time and PFTs revealed a ratio of 57, a FEV1 of 1.2 L, 57% predicted evidence of air trapping with an RV of 133% and a DLCO of 41.  Today, patient is very anxious.  She met with thoracic surgery today.   Tearful today in the office about everything that is been going on.  Also worried about the possible bladder lesion that was found on PET scan.  OV 01/20/2021: Patient here today for follow-up regarding complaints of left-sided chest pain.  This happens to be predominantly associated with big deep breaths moving around.  She can pinpoint to the pain that is located just underneath her left bra line and underneath her left breast.  She feels like the skin is tender right there.  She is not really done many things over-the-counter to see if it makes a difference.  But she says it comes on and off occasionally throughout the day.  She has been using her inhaler.  She has not had any recent follow-up with radiation oncology.  She was seen in the emergency department in April for similar complaints of chest pain.  She had a CTA of the chest that was negative for PE.  Also reviewed these images today with patient in the office after her 10 treatments of SBRT to the left lower lobe adenocarcinoma.  The right cystic-appearing lesion appears stable in comparison to previous imaging.   Past Medical History:  Diagnosis Date  . Abnormal CT lung screening 07/10/2020  . Anxiety   . Arthritis   . Bladder cancer (HCC) 09/2020   urologist--- dr manny, incidental finding on PET scan 12/ 2021,  s/p TURBT 09-18-2020 high grade papillary urothelial   . Centrilobular emphysema (HCC)   . Close exposure to COVID-19   virus 05/03/2019  . Constipation 04/09/2019  . DDD (degenerative disc disease), lumbar   . Depression   . DOE (dyspnea on exertion)    10-29-2020  per pt can do house work without sob, when walks/ stairs stops frequently  (stated recovers quickly when stops)  . Epidermoid cyst 10/29/2018   Recurrent. Located on left hip at iliac crest 2" anterior to midaxillary line. Removed 2013, 2020, 2022.   Marland Kitchen Epigastric pain 11/16/2020  . Fibroid 2003  . GERD (gastroesophageal reflux disease)   . Leg cramping 11/13/2019  .  Menopausal vaginal dryness 11/13/2019  . Primary adenocarcinoma of lower lobe of left lung [facility]) 07/2020   oncologist--- dr Julien Nordmann---  dx 11/ 2021 bilateral lower lobe masses,  s/p bronchoscopy w/ bx's 07-28-2020 malignancy cells on left ;  completed SBRT bilateral lower lung 10-16-2020 5 fractions  . Radiation-induced dermatitis    on 10-29-2020 pt complaint stomach and chest areas,  completed SBRT 10-16-2020  . Seasonal allergies   . Stage 3 severe COPD by GOLD classification [facility])    pulmonologist--- dr Valeta Harms,  using anoro inhaler daily  . Wears glasses      Family History  Problem Relation Age of Onset  . Hypertension Mother   . Hyperlipidemia Mother   . Early death Sister 46       Drug overdose  . Dementia Father   . Cancer Father        lung  . Parkinson's disease Father   . Hypertension Daughter   . Diabetes Daughter   . Cancer Brother        lung     Past Surgical History:  Procedure Laterality Date  . ANTERIOR CERVICAL DECOMP/DISCECTOMY FUSION  03-08-2003 @ [place]   C3 --- C6  . ARTHROTOMY Right 11/09/2015   Procedure: RIGHT WRIST DISTAL RADIAL ULNAR JOINT ARTHROTOMY AND DEBRIDEMENT,  AND ;  Surgeon: Iran Planas, MD;  Location: [place];  Service: Orthopedics;  Laterality: Right;  . BRONCHIAL BIOPSY  07/28/2020   Procedure: BRONCHIAL BIOPSIES;  Surgeon: [name], DO;  Location: [place] [facility];  Service: Pulmonary;;  . BRONCHIAL BRUSHINGS  07/28/2020   Procedure: BRONCHIAL BRUSHINGS;  Surgeon: [name], DO;  Location: [place] [facility];  Service: Pulmonary;;  . BRONCHIAL NEEDLE ASPIRATION BIOPSY  07/28/2020   Procedure: BRONCHIAL NEEDLE ASPIRATION BIOPSIES;  Surgeon: [name], DO;  Location: [place] [facility];  Service: Pulmonary;;  . BRONCHIAL WASHINGS  07/28/2020   Procedure: BRONCHIAL WASHINGS;  Surgeon: [name], DO;  Location: [place] [facility];  Service: Pulmonary;;  . COLONOSCOPY    . CYSTOSCOPY WITH RETROGRADE PYELOGRAM, URETEROSCOPY AND STENT  PLACEMENT N/A 09/18/2020   Procedure: CYSTOSCOPY WITH RETROGRADE PYELOGRAM bilateral,URETEROSCOPY AND  STENT PLACEMENT right ureter;  Surgeon: Alexis Frock, MD;  Location: [facility];  Service: Urology;  Laterality: N/A;  1 HR  . HARDWARE REMOVAL Right 07/24/2013   Procedure: RIGHT WRIST HARDWARE REMOVAL, JOINT RELEASE, RIGHT HAND MANIPULATION UNDER ANESTHESIA;  Surgeon: Linna Hoff, MD;  Location: [place];  Service: Orthopedics;  Laterality: Right;  . OPEN REDUCTION INTERNAL FIXATION (ORIF) DISTAL RADIAL FRACTURE Right 11/08/2012   Procedure: OPEN REDUCTION INTERNAL FIXATION (ORIF) RIGHT DISTAL RADIUS FRACTURE;  Surgeon: Linna Hoff, MD;  Location: [place];  Service: Orthopedics;  Laterality: Right;  . TRANSURETHRAL RESECTION OF BLADDER TUMOR N/A 09/18/2020   Procedure: TRANSURETHRAL RESECTION OF BLADDER TUMOR (TURBT);  Surgeon: Alexis Frock, MD;  Location: [facility];  Service: Urology;  Laterality:  N/A;  . TRANSURETHRAL RESECTION OF BLADDER TUMOR N/A 11/04/2020   Procedure: RESTAGING TRANSURETHRAL RESECTION OF BLADDER TUMOR (TURBT); BILATERAL RETROGRADE PYELOGRAM;  Surgeon: Alexis Frock, MD;  Location[facility];  Service: Urology;  Laterality: N/A;  1 HR  . VIDEO BRONCHOSCOPY WITH ENDOBRONCHIAL NAVIGATION N/A 07/28/2020   Procedure: VIDEO BRONCHOSCOPY WITH ENDOBRONCHIAL NAVIGATION;  Surgeon: [name], DO;  Location: [place];  Service: Pulmonary;  Laterality: N/A;  . WRIST ARTHROPLASTY Right 11/09/2015   Procedure: OR DISTAL ULNAR RESECTION ARTHROPLASTY ;  Surgeon: Iran Planas, MD;  Location: [place];  Service: Orthopedics;  Laterality: Right;  . WRIST ARTHROSCOPY WITH DEBRIDEMENT Right 07/21/2014   Procedure: RIGHT WRIST DISTAL RADIAL ULNAR JOINT DEBRIDEMENT AND JOINT RELEASE POSSIBLE TENDON INTERPOSITION;  Surgeon: Linna Hoff, MD;  Location: [place];  Service: Orthopedics;  Laterality: Right;    Social History   Socioeconomic  History  . Marital status: Married    Spouse name: Teigen Parslow  . Number of children: 2  . Years of education: 65  . Highest education level: Not on file  Occupational History  . Occupation: Unemployed     Employer:  [employer]    Comment: Sweep   Tobacco Use  . Smoking status: Former Smoker    Packs/day: 1.00    Years: 50.00    Pack years: 50.00    Types: Cigarettes    Start date: 04/13/1975    Quit date: 07/14/2020    Years since quitting: 0.5  . Smokeless tobacco: Never Used  Vaping Use  . Vaping Use: Never used  Substance and Sexual Activity  . Alcohol use: Not Currently    Comment: Holidays only  . Drug use: Never  . Sexual activity: Not Currently    Partners: Male    Birth control/protection: Post-menopausal  Other Topics Concern  . Not on file  Social History Narrative   Patient lives alone currently in [place].    Patient is married, they are living separately at this time.    Patient enjoys watching tv, cooking, and spending time with her children and grandchildren.    Social Determinants of Health   Financial Resource Strain: Not on file  Food Insecurity: Not on file  Transportation Needs: Not on file  Physical Activity: Not on file  Stress: Not on file  Social Connections: Not on file  Intimate Partner Violence: Not on file     Allergies  Allergen Reactions  . Codeine Nausea And Vomiting and Other (See Comments)    Hallucinations  . Prednisone Other (See Comments)    'Made me feel funny inside my head"-pt cannot be specific     Outpatient Medications Prior to Visit  Medication Sig Dispense Refill  . acetaminophen (TYLENOL) 650 MG CR tablet Take 1,300 mg by mouth every 8 (eight) hours as needed for pain.    Marland Kitchen albuterol (VENTOLIN HFA) 108 (90 Base) MCG/ACT inhaler Inhale 2 puffs into the lungs every 6 (six) hours as needed for wheezing or shortness of breath. 8 g 0  . Ascorbic Acid (VITAMIN C ADULT GUMMIES PO) Take 1 tablet by mouth daily.    Marland Kitchen  atorvastatin (LIPITOR) 40 MG tablet Take 1 tablet (40 mg total) by mouth daily. (Patient taking differently: Take 40 mg by mouth at bedtime.) 90 tablet 3  . azelastine (OPTIVAR) 0.05 % ophthalmic solution Place 1 drop into both eyes 2 (two) times daily as needed for allergies.    . carbamide peroxide (DEBROX) 6.5 % OTIC  solution Place 5 drops into both ears 2 (two) times daily. 15 mL 0  . cetirizine (ZYRTEC) 10 MG tablet Take 1 tablet (10 mg total) by mouth daily. 30 tablet 6  . clobetasol cream (TEMOVATE) 0.05 % Apply 1 application topically 2 (two) times daily as needed. (Patient taking differently: Apply 1 application topically 2 (two) times daily as needed (psoriasis).) 30 g 2  . diclofenac Sodium (VOLTAREN) 1 % GEL Apply 2 g topically 4 (four) times daily. Apply to painful area of ribs. Need to use regularly for relief. 100 g 2  . fluticasone (FLONASE) 50 MCG/ACT nasal spray Place 2 sprays into both nostrils daily. 16 g 6  . gabapentin (NEURONTIN) 300 MG capsule Take 3 capsules (900 mg total) by mouth 3 (three) times daily. (Patient taking differently: Take 300-600 mg by mouth 2 (two) times daily as needed (pain).) 270 capsule 3  . hydrOXYzine (ATARAX/VISTARIL) 50 MG tablet Take 1 tablet (50 mg total) by mouth at bedtime as needed for anxiety or itching (sleep). 30 tablet 1  . Melatonin 10 MG TBCR Take 10 mg by mouth at bedtime. 90 tablet 2  . pantoprazole (PROTONIX) 40 MG tablet Take 1 tablet (40 mg total) by mouth daily. 30 tablet 3  . polyethylene glycol powder (GLYCOLAX/MIRALAX) 17 GM/SCOOP powder Take 17 g by mouth daily. 3350 g 1  . venlafaxine XR (EFFEXOR-XR) 75 MG 24 hr capsule TAKE 1 CAPSULE(75 MG) BY MOUTH DAILY WITH BREAKFAST 90 capsule 0   No facility-administered medications prior to visit.    Review of Systems  Constitutional: Negative for chills, fever, malaise/fatigue and weight loss.  HENT: Negative for hearing loss, sore throat and tinnitus.   Eyes: Negative for blurred  vision and double vision.  Respiratory: Positive for shortness of breath. Negative for cough, hemoptysis, sputum production, wheezing and stridor.   Cardiovascular: Positive for chest pain. Negative for palpitations, orthopnea, leg swelling and PND.  Gastrointestinal: Negative for abdominal pain, constipation, diarrhea, heartburn, nausea and vomiting.  Genitourinary: Negative for dysuria, hematuria and urgency.  Musculoskeletal: Negative for joint pain and myalgias.  Skin: Negative for itching and rash.  Neurological: Negative for dizziness, tingling, weakness and headaches.  Endo/Heme/Allergies: Negative for environmental allergies. Does not bruise/bleed easily.  Psychiatric/Behavioral: Negative for depression. The patient is not nervous/anxious and does not have insomnia.   All other systems reviewed and are negative.    Objective:  Physical Exam Vitals reviewed.  Constitutional:      General: She is not in acute distress.    Appearance: She is well-developed.  HENT:     Head: Normocephalic and atraumatic.     Mouth/Throat:     Pharynx: No oropharyngeal exudate.  Eyes:     Conjunctiva/sclera: Conjunctivae normal.     Pupils: Pupils are equal, round, and reactive to light.  Neck:     Vascular: No JVD.     Trachea: No tracheal deviation.     Comments: Loss of supraclavicular fat Cardiovascular:     Rate and Rhythm: Normal rate and regular rhythm.     Heart sounds: S1 normal and S2 normal.     Comments: Distant heart tones Pulmonary:     Effort: No tachypnea or accessory muscle usage.     Breath sounds: No stridor. Decreased breath sounds (throughout all lung fields) present. No wheezing, rhonchi or rales.  Abdominal:     General: Bowel sounds are normal. There is no distension.     Palpations: Abdomen is soft.       Tenderness: There is no abdominal tenderness.  Musculoskeletal:        General: Deformity (muscle wasting ) present.  Skin:    General: Skin is warm and dry.      Capillary Refill: Capillary refill takes less than 2 seconds.     Findings: No rash.  Neurological:     Mental Status: She is alert and oriented to person, place, and time.  Psychiatric:        Behavior: Behavior normal.      Vitals:   01/20/21 1624  BP: 116/68  Pulse: 71  Temp: 97.8 F (36.6 C)  TempSrc: Temporal  SpO2: 95%  Weight: 177 lb 8 oz (80.5 kg)  Height: 5' 8" (1.727 m)   95% on RA BMI Readings from Last 3 Encounters:  01/20/21 26.99 kg/m  01/07/21 25.99 kg/m  12/31/20 27.28 kg/m   Wt Readings from Last 3 Encounters:  01/20/21 177 lb 8 oz (80.5 kg)  01/07/21 176 lb (79.8 kg)  12/31/20 179 lb 6.4 oz (81.4 kg)     CBC    Component Value Date/Time   WBC 7.6 12/17/2020 1029   RBC 4.36 12/17/2020 1029   HGB 12.2 12/17/2020 1029   HGB 13.1 08/18/2020 1348   HGB 14.1 06/23/2020 1237   HCT 38.7 12/17/2020 1029   HCT 43.6 06/23/2020 1237   PLT 337 12/17/2020 1029   PLT 360 08/18/2020 1348   PLT 366 06/23/2020 1237   MCV 88.8 12/17/2020 1029   MCV 86 06/23/2020 1237   MCH 28.0 12/17/2020 1029   MCHC 31.5 12/17/2020 1029   RDW 13.6 12/17/2020 1029   RDW 13.2 06/23/2020 1237   LYMPHSABS 1.3 12/17/2020 1029   LYMPHSABS 2.2 06/23/2020 1237   MONOABS 0.7 12/17/2020 1029   EOSABS 0.2 12/17/2020 1029   EOSABS 0.2 06/23/2020 1237   BASOSABS 0.1 12/17/2020 1029   BASOSABS 0.1 06/23/2020 1237     Chest Imaging:  Lung cancer screening CT October 2021: Bilateral cystic appearing masses concerning for multifocal adenocarcinoma. The patient's images have been independently reviewed by me.    CT chest 12/17/2020: Stable cystic lesion within the right base, left lower lobe cystic subsolid lesion consistent with previous adenocarcinoma is stable.  Also has associated moderate emphysema.. The patient's images have been independently reviewed by me.    Pulmonary Functions Testing Results: PFT Results Latest Ref Rng & Units 08/03/2020  FVC-Pre L 2.03   FVC-Predicted Pre % 67  FVC-Post L 2.15  FVC-Predicted Post % 71  Pre FEV1/FVC % % 67  Post FEV1/FCV % % 57  FEV1-Pre L 1.37  FEV1-Predicted Pre % 58  FEV1-Post L 1.22  DLCO uncorrected ml/min/mmHg 9.46  DLCO UNC% % 41  DLCO corrected ml/min/mmHg 9.26  DLCO COR %Predicted % 40  DLVA Predicted % 57  TLC L 5.13  TLC % Predicted % 90  RV % Predicted % 133    FeNO:   Pathology:   Echocardiogram:   Heart Catheterization:     Assessment & Plan:     ICD-10-CM   1. Chest pain on breathing  R07.1   2. Adenocarcinoma, lung, left (HCC)  C34.92   3. Mass of lower lobe of left lung  R91.8   4. Mass of lower lobe of right lung  R91.8   5. Centrilobular emphysema (HCC)  J43.2   6. Former smoker  Z87.891     Discussion:  This is a 64-year-old female, severe COPD, and severely reduced DLCO, centrilobular emphysema,   left lower lobe adenocarcinoma, nondiagnostic cystic lesion on the right status post bronchoscopy which is also concerning for multifocal adenocarcinoma.  Heard pains in the chest seem to be intermittent.  They appear musculoskeletal to me on exam.  She is tender with palpation along that same dermatomal pattern across the left lower rib cage.  No rash present.  Plan: I think she could treat the left-sided pains conservatively.  Use over-the-counter medications, ice and heat to help alleviate discomfort. I will follow-up with Dr. Lois Huxley office with radiation oncology. She at least needs a repeat noncontrasted CT 6 months after her previous which will be October 2022. If the lower lobe right-sided cystic lesion progresses we will need to consider whether or not going to repeat bronchoscopy for tissue sampling to confirm malignancy or have repeat empiric treatments for radiation. If she needed repeat bronchoscopy I do think she would tolerate this fine.  Continue Cammie Sickle for COPD management. Continue albuterol for shortness of breath and wheezing.    Current  Outpatient Medications:  .  acetaminophen (TYLENOL) 650 MG CR tablet, Take 1,300 mg by mouth every 8 (eight) hours as needed for pain., Disp: , Rfl:  .  albuterol (VENTOLIN HFA) 108 (90 Base) MCG/ACT inhaler, Inhale 2 puffs into the lungs every 6 (six) hours as needed for wheezing or shortness of breath., Disp: 8 g, Rfl: 0 .  Ascorbic Acid (VITAMIN C ADULT GUMMIES PO), Take 1 tablet by mouth daily., Disp: , Rfl:  .  atorvastatin (LIPITOR) 40 MG tablet, Take 1 tablet (40 mg total) by mouth daily. (Patient taking differently: Take 40 mg by mouth at bedtime.), Disp: 90 tablet, Rfl: 3 .  azelastine (OPTIVAR) 0.05 % ophthalmic solution, Place 1 drop into both eyes 2 (two) times daily as needed for allergies., Disp: , Rfl:  .  carbamide peroxide (DEBROX) 6.5 % OTIC solution, Place 5 drops into both ears 2 (two) times daily., Disp: 15 mL, Rfl: 0 .  cetirizine (ZYRTEC) 10 MG tablet, Take 1 tablet (10 mg total) by mouth daily., Disp: 30 tablet, Rfl: 6 .  clobetasol cream (TEMOVATE) 0.17 %, Apply 1 application topically 2 (two) times daily as needed. (Patient taking differently: Apply 1 application topically 2 (two) times daily as needed (psoriasis).), Disp: 30 g, Rfl: 2 .  diclofenac Sodium (VOLTAREN) 1 % GEL, Apply 2 g topically 4 (four) times daily. Apply to painful area of ribs. Need to use regularly for relief., Disp: 100 g, Rfl: 2 .  fluticasone (FLONASE) 50 MCG/ACT nasal spray, Place 2 sprays into both nostrils daily., Disp: 16 g, Rfl: 6 .  gabapentin (NEURONTIN) 300 MG capsule, Take 3 capsules (900 mg total) by mouth 3 (three) times daily. (Patient taking differently: Take 300-600 mg by mouth 2 (two) times daily as needed (pain).), Disp: 270 capsule, Rfl: 3 .  hydrOXYzine (ATARAX/VISTARIL) 50 MG tablet, Take 1 tablet (50 mg total) by mouth at bedtime as needed for anxiety or itching (sleep)., Disp: 30 tablet, Rfl: 1 .  Melatonin 10 MG TBCR, Take 10 mg by mouth at bedtime., Disp: 90 tablet, Rfl: 2 .   pantoprazole (PROTONIX) 40 MG tablet, Take 1 tablet (40 mg total) by mouth daily., Disp: 30 tablet, Rfl: 3 .  polyethylene glycol powder (GLYCOLAX/MIRALAX) 17 GM/SCOOP powder, Take 17 g by mouth daily., Disp: 3350 g, Rfl: 1 .  venlafaxine XR (EFFEXOR-XR) 75 MG 24 hr capsule, TAKE 1 CAPSULE(75 MG) BY MOUTH DAILY WITH BREAKFAST, Disp: 90 capsule, Rfl: 0   L , DO [facility] 01/20/2021 4:29 PM   

## 2021-01-20 NOTE — Patient Instructions (Addendum)
Thank you for visiting [name] at [facility]. Today we recommend the following:  I will reach out to radiation oncology Keep follow up with them.  See me in October 2022 with repeat CT Chest WO Contrast   Return in about 5 months (around 06/22/2021) for with APP or [name].    Please do your part to reduce the spread of COVID-19.

## 2021-01-23 ENCOUNTER — Other Ambulatory Visit: Payer: Self-pay | Admitting: Family Medicine

## 2021-01-23 DIAGNOSIS — C3432 Malignant neoplasm of lower lobe, left bronchus or lung: Secondary | ICD-10-CM

## 2021-01-25 ENCOUNTER — Other Ambulatory Visit: Payer: Self-pay | Admitting: Family Medicine

## 2021-01-25 DIAGNOSIS — C3432 Malignant neoplasm of lower lobe, left bronchus or lung: Secondary | ICD-10-CM

## 2021-01-26 ENCOUNTER — Other Ambulatory Visit: Payer: Self-pay | Admitting: Radiation Oncology

## 2021-01-26 DIAGNOSIS — C3432 Malignant neoplasm of lower lobe, left bronchus or lung: Secondary | ICD-10-CM

## 2021-01-26 NOTE — Progress Notes (Incomplete)
  Radiation Oncology         [phone] ________________________________  Name: [name] MRN: [id number]  Date: 10/16/2020  DOB: [birth year]  End of Treatment Note  Diagnosis:   Stage IIA (T2b, N0, M0) non-small cell lung cancer, adenocarcinoma of the left lower lobe with suspicious right lower lobe cystic lesion     Indication for treatment:  Curative       Radiation treatment dates:   1/25, 1/27, 1/31, 2/2, 10/16/20  Site/dose:      Beams/energy:   SBRT, VMAT / 6X-FFF  Narrative: The patient tolerated radiation treatment relatively well.  Of note, she experienced some mild breathing issues with the abdomen paddle and was given oxygen for treatments.  Plan: The patient has completed radiation treatment. The patient will return to radiation oncology clinic for routine followup in one month. I advised them to call or return sooner if they have any questions or concerns related to their recovery or treatment.  -----------------------------------  Blair Promise, PhD, MD  This document serves as a record of services personally performed by Gery Pray, MD. It was created on his behalf by Wilburn Mylar, a trained medical scribe. The creation of this record is based on the scribe's personal observations and the provider's statements to them. This document has been checked and approved by the attending provider.

## 2021-02-05 ENCOUNTER — Other Ambulatory Visit: Payer: Self-pay | Admitting: Family Medicine

## 2021-02-05 DIAGNOSIS — C3432 Malignant neoplasm of lower lobe, left bronchus or lung: Secondary | ICD-10-CM

## 2021-02-10 ENCOUNTER — Other Ambulatory Visit: Payer: Self-pay | Admitting: Family Medicine

## 2021-02-10 DIAGNOSIS — G8929 Other chronic pain: Secondary | ICD-10-CM

## 2021-02-10 DIAGNOSIS — M545 Low back pain, unspecified: Secondary | ICD-10-CM

## 2021-02-11 ENCOUNTER — Other Ambulatory Visit: Payer: Self-pay | Admitting: Family Medicine

## 2021-02-11 DIAGNOSIS — M545 Low back pain, unspecified: Secondary | ICD-10-CM

## 2021-02-15 ENCOUNTER — Ambulatory Visit (INDEPENDENT_AMBULATORY_CARE_PROVIDER_SITE_OTHER): Payer: Medicare Other | Admitting: Pulmonary Disease

## 2021-02-15 ENCOUNTER — Encounter: Payer: Self-pay | Admitting: Pulmonary Disease

## 2021-02-15 ENCOUNTER — Other Ambulatory Visit: Payer: Self-pay

## 2021-02-15 ENCOUNTER — Ambulatory Visit: Payer: Medicare Other | Admitting: Pulmonary Disease

## 2021-02-15 VITALS — BP 110/60 | HR 111 | Temp 98.1°F | Ht 68.0 in | Wt 178.4 lb

## 2021-02-15 DIAGNOSIS — J449 Chronic obstructive pulmonary disease, unspecified: Secondary | ICD-10-CM | POA: Diagnosis not present

## 2021-02-15 DIAGNOSIS — C3492 Malignant neoplasm of unspecified part of left bronchus or lung: Secondary | ICD-10-CM

## 2021-02-15 DIAGNOSIS — Z87891 Personal history of nicotine dependence: Secondary | ICD-10-CM

## 2021-02-15 DIAGNOSIS — R918 Other nonspecific abnormal finding of lung field: Secondary | ICD-10-CM

## 2021-02-15 DIAGNOSIS — J432 Centrilobular emphysema: Secondary | ICD-10-CM

## 2021-02-15 NOTE — Patient Instructions (Signed)
Thank you for visiting [name] at [facility]. Today we recommend the following:  Continue Anoro Continue albuterol  Follow up with me in November after your CT chest with rad onc.   Return in about 5 months (around 07/18/2021) for with APP or [name].    Please do your part to reduce the spread of COVID-19.

## 2021-02-15 NOTE — Progress Notes (Signed)
Synopsis: Referred in November 2021 for abnormal CT chest .  By [name], MD  Subjective:   PATIENT ID: [name] GENDER: female DOB: June 04, 1956, MRN: [id number]  Chief Complaint  Patient presents with  . Follow-up    Patient states she feels good overall, no concerns at this time.     this is a 65 year old female, past medical history of depression, anxiety, current tobacco abuse.  She initially seen by her primary care provider which encouraged her to have a lung cancer screening CT.  She had this completed in October 2021.  The CT scan revealed bilateral lower lobe cystic peripheral based lesions both greater than 4 cm in size concerning for multifocal adenocarcinoma.  Patient has no respiratory symptoms at this time.  She is a current smoker.  She has smoked for 41 years at approximately 1 pack/day.  She presents today in the office with her husband and her daughter via phone.  Long discussion today regarding CT imaging as well as next appropriate steps.  Patient denies fevers chills night sweats weight loss or hemoptysis.  OV 08/28/2020: Patient here today for follow-up after recent bronchoscopy.  Patient has a primary left lower lobe adenocarcinoma and likely has a carcinoma in the left lower lobe however tissue biopsy was inconclusive on this side both are very irregular cystic shaped lesions concerning for multifocal adenocarcinoma.  PET scan was relatively equivocal with him low to moderate SUV uptake.  Patient was referred to thoracic surgery.  Case was discussed with Dr. Kipp Brood today because she was seen earlier today in the office.  She is also been referred for evaluation of an irregular shaped bladder lesion that was found on PET scan.  Pulmonary function tests have also been completed during this time and PFTs revealed a ratio of 57, a FEV1 of 1.2 L, 57% predicted evidence of air trapping with an RV of 133% and a DLCO of 41.  Today, patient is very anxious.  She met with  thoracic surgery today.  Tearful today in the office about everything that is been going on.  Also worried about the possible bladder lesion that was found on PET scan.  OV 01/20/2021: Patient here today for follow-up regarding complaints of left-sided chest pain.  This happens to be predominantly associated with big deep breaths moving around.  She can pinpoint to the pain that is located just underneath her left bra line and underneath her left breast.  She feels like the skin is tender right there.  She is not really done many things over-the-counter to see if it makes a difference.  But she says it comes on and off occasionally throughout the day.  She has been using her inhaler.  She has not had any recent follow-up with radiation oncology.  She was seen in the emergency department in April for similar complaints of chest pain.  She had a CTA of the chest that was negative for PE.  Also reviewed these images today with patient in the office after her 10 treatments of SBRT to the left lower lobe adenocarcinoma.  The right cystic-appearing lesion appears stable in comparison to previous imaging.  OV 02/15/2021: Here today for follow-up.  Overall from respiratory standpoint doing well using her inhalers.  Had recent follow-up with radiation oncology.  She does have a CT scan planned for October 2022.  I discussed patient's case with radiation oncology a few weeks ago.  She is feeling less anxious.  Overall in a  better mood in comparison to where she was several months ago with new diagnosis and abnormal imaging.   Past Medical History:  Diagnosis Date  . Abnormal CT lung screening 07/10/2020  . Anxiety   . Arthritis   . Bladder cancer [facility]) 09/2020   urologist--- dr Berneice Heinrich, incidental finding on PET scan 12/ 2021,  s/p TURBT 09-18-2020 high grade papillary urothelial   . Centrilobular emphysema (HCC)   . Close exposure to COVID-19 virus 05/03/2019  . Constipation 04/09/2019  . DDD (degenerative disc  disease), lumbar   . Depression   . DOE (dyspnea on exertion)    10-29-2020  per pt can do house work without sob, when walks/ stairs stops frequently  (stated recovers quickly when stops)  . Epidermoid cyst 10/29/2018   Recurrent. Located on left hip at iliac crest 2" anterior to midaxillary line. Removed 2013, 2020, 2022.   Marland Kitchen Epigastric pain 11/16/2020  . Fibroid 2003  . GERD (gastroesophageal reflux disease)   . Leg cramping 11/13/2019  . Menopausal vaginal dryness 11/13/2019  . Primary adenocarcinoma of lower lobe of left lung [facility]) 07/2020   oncologist--- dr Arbutus Ped---  dx 11/ 2021 bilateral lower lobe masses,  s/p bronchoscopy w/ bx's 07-28-2020 malignancy cells on left ;  completed SBRT bilateral lower lung 10-16-2020 5 fractions  . Radiation-induced dermatitis    on 10-29-2020 pt complaint stomach and chest areas,  completed SBRT 10-16-2020  . Seasonal allergies   . Stage 3 severe COPD by GOLD classification [facility])    pulmonologist--- dr Tonia Brooms,  using anoro inhaler daily  . Wears glasses      Family History  Problem Relation Age of Onset  . Hypertension Mother   . Hyperlipidemia Mother   . Early death Sister 44       Drug overdose  . Dementia Father   . Cancer Father        lung  . Parkinson's disease Father   . Hypertension Daughter   . Diabetes Daughter   . Cancer Brother        lung     Past Surgical History:  Procedure Laterality Date  . ANTERIOR CERVICAL DECOMP/DISCECTOMY FUSION  03-08-2003 @ [facility]   C3 --- C6  . ARTHROTOMY Right 11/09/2015   Procedure: RIGHT WRIST DISTAL RADIAL ULNAR JOINT ARTHROTOMY AND DEBRIDEMENT,  AND ;  Surgeon: Bradly Bienenstock, MD;  Location: [facility] OR;  Service: Orthopedics;  Laterality: Right;  . BRONCHIAL BIOPSY  07/28/2020   Procedure: BRONCHIAL BIOPSIES;  Surgeon: Josephine Igo, DO;  Location: [facility] ENDOSCOPY;  Service: Pulmonary;;  . BRONCHIAL BRUSHINGS  07/28/2020   Procedure: BRONCHIAL BRUSHINGS;  Surgeon: Josephine Igo, DO;  Location: [facility]  ENDOSCOPY;  Service: Pulmonary;;  . BRONCHIAL NEEDLE ASPIRATION BIOPSY  07/28/2020   Procedure: BRONCHIAL NEEDLE ASPIRATION BIOPSIES;  Surgeon: Josephine Igo, DO;  Location: [facility] ENDOSCOPY;  Service: Pulmonary;;  . BRONCHIAL WASHINGS  07/28/2020   Procedure: BRONCHIAL WASHINGS;  Surgeon: Josephine Igo, DO;  Location: [facility] ENDOSCOPY;  Service: Pulmonary;;  . COLONOSCOPY    . CYSTOSCOPY WITH RETROGRADE PYELOGRAM, URETEROSCOPY AND STENT PLACEMENT N/A 09/18/2020   Procedure: CYSTOSCOPY WITH RETROGRADE PYELOGRAM bilateral,URETEROSCOPY AND  STENT PLACEMENT right ureter;  Surgeon: Sebastian Ache, MD;  Location: [facility] ORS;  Service: Urology;  Laterality: N/A;  1 HR  . HARDWARE REMOVAL Right 07/24/2013   Procedure: RIGHT WRIST HARDWARE REMOVAL, JOINT RELEASE, RIGHT HAND MANIPULATION UNDER ANESTHESIA;  Surgeon: Sharma Covert, MD;  Location: [facility];  Service: Orthopedics;  Laterality: Right;  . OPEN REDUCTION INTERNAL FIXATION (ORIF) DISTAL RADIAL FRACTURE Right 11/08/2012   Procedure: OPEN REDUCTION INTERNAL FIXATION (ORIF) RIGHT DISTAL RADIUS FRACTURE;  Surgeon: Linna Hoff, MD;  Location: [place];  Service: Orthopedics;  Laterality: Right;  . TRANSURETHRAL RESECTION OF BLADDER TUMOR N/A 09/18/2020   Procedure: TRANSURETHRAL RESECTION OF BLADDER TUMOR (TURBT);  Surgeon: Alexis Frock, MD;  Location: [facility] OR[facility];  Service: Urology;  Laterality: N/A;  . TRANSURETHRAL RESECTION OF BLADDER TUMOR N/A 11/04/2020   Procedure: RESTAGING TRANSURETHRAL RESECTION OF BLADDER TUMOR (TURBT); BILATERAL RETROGRADE PYELOGRAM;  Surgeon: Alexis Frock, MD;  Location: [facility];  Service: Urology;  Laterality: N/A;  1 HR  . VIDEO BRONCHOSCOPY WITH ENDOBRONCHIAL NAVIGATION N/A 07/28/2020   Procedure: VIDEO BRONCHOSCOPY WITH ENDOBRONCHIAL NAVIGATION;  Surgeon: [name], DO;  Location: [place];  Service: Pulmonary;  Laterality: N/A;  . WRIST ARTHROPLASTY Right  11/09/2015   Procedure: OR DISTAL ULNAR RESECTION ARTHROPLASTY ;  Surgeon: Iran Planas, MD;  Location: [place];  Service: Orthopedics;  Laterality: Right;  . WRIST ARTHROSCOPY WITH DEBRIDEMENT Right 07/21/2014   Procedure: RIGHT WRIST DISTAL RADIAL ULNAR JOINT DEBRIDEMENT AND JOINT RELEASE POSSIBLE TENDON INTERPOSITION;  Surgeon: Linna Hoff, MD;  Location: [place];  Service: Orthopedics;  Laterality: Right;    Social History   Socioeconomic History  . Marital status: Married    Spouse name: Lashaun Poch  . Number of children: 2  . Years of education: 65  . Highest education level: Not on file  Occupational History  . Occupation: Unemployed     Employer:  [employer]    Comment: Sweep   Tobacco Use  . Smoking status: Former Smoker    Packs/day: 1.00    Years: 50.00    Pack years: 50.00    Types: Cigarettes    Start date: 04/13/1975    Quit date: 07/14/2020    Years since quitting: 0.5  . Smokeless tobacco: Never Used  Vaping Use  . Vaping Use: Never used  Substance and Sexual Activity  . Alcohol use: Not Currently    Comment: Holidays only  . Drug use: Never  . Sexual activity: Not Currently    Partners: Male    Birth control/protection: Post-menopausal  Other Topics Concern  . Not on file  Social History Narrative   Patient lives alone currently in [place].    Patient is married, they are living separately at this time.    Patient enjoys watching tv, cooking, and spending time with her children and grandchildren.    Social Determinants of Health   Financial Resource Strain: Not on file  Food Insecurity: Not on file  Transportation Needs: Not on file  Physical Activity: Not on file  Stress: Not on file  Social Connections: Not on file  Intimate Partner Violence: Not on file     Allergies  Allergen Reactions  . Codeine Nausea And Vomiting and Other (See Comments)    Hallucinations  . Prednisone Other (See Comments)    'Made me feel funny inside my head"-pt cannot  be specific     Outpatient Medications Prior to Visit  Medication Sig Dispense Refill  . acetaminophen (TYLENOL) 650 MG CR tablet Take 1,300 mg by mouth every 8 (eight) hours as needed for pain.    Marland Kitchen albuterol (VENTOLIN HFA) 108 (90 Base) MCG/ACT inhaler Inhale 2 puffs into the lungs every 6 (six) hours as needed for wheezing or shortness of breath. 18 g  1  . Ascorbic Acid (VITAMIN C ADULT GUMMIES PO) Take 1 tablet by mouth daily.    Marland Kitchen atorvastatin (LIPITOR) 40 MG tablet Take 1 tablet (40 mg total) by mouth daily. (Patient taking differently: Take 40 mg by mouth at bedtime.) 90 tablet 3  . azelastine (OPTIVAR) 0.05 % ophthalmic solution Place 1 drop into both eyes 2 (two) times daily as needed for allergies.    . carbamide peroxide (DEBROX) 6.5 % OTIC solution Place 5 drops into both ears 2 (two) times daily. 15 mL 0  . cetirizine (ZYRTEC) 10 MG tablet Take 1 tablet (10 mg total) by mouth daily. 30 tablet 6  . clobetasol cream (TEMOVATE) 1.61 % Apply 1 application topically 2 (two) times daily as needed. (Patient taking differently: Apply 1 application topically 2 (two) times daily as needed (psoriasis).) 30 g 2  . cyclobenzaprine (FLEXERIL) 10 MG tablet TAKE 1 TABLET BY MOUTH TWICE DAILY AS NEEDED FOR MUSCLE SPASM 20 tablet 0  . diclofenac Sodium (VOLTAREN) 1 % GEL Apply 2 g topically 4 (four) times daily. Apply to painful area of ribs. Need to use regularly for relief. 100 g 2  . fluticasone (FLONASE) 50 MCG/ACT nasal spray Place 2 sprays into both nostrils daily. 16 g 6  . gabapentin (NEURONTIN) 300 MG capsule Take 3 capsules (900 mg total) by mouth 3 (three) times daily. (Patient taking differently: Take 300-600 mg by mouth 2 (two) times daily as needed (pain).) 270 capsule 3  . hydrOXYzine (ATARAX/VISTARIL) 50 MG tablet Take 1 tablet (50 mg total) by mouth at bedtime as needed for anxiety or itching (sleep). 30 tablet 1  . Melatonin 10 MG TBCR Take 10 mg by mouth at bedtime. 90 tablet 2  .  pantoprazole (PROTONIX) 40 MG tablet Take 1 tablet (40 mg total) by mouth daily. 30 tablet 3  . polyethylene glycol powder (GLYCOLAX/MIRALAX) 17 GM/SCOOP powder Take 17 g by mouth daily. 3350 g 1  . venlafaxine XR (EFFEXOR-XR) 75 MG 24 hr capsule TAKE 1 CAPSULE(75 MG) BY MOUTH DAILY WITH BREAKFAST 90 capsule 0   No facility-administered medications prior to visit.    Review of Systems  Constitutional: Negative for chills, fever, malaise/fatigue and weight loss.  HENT: Negative for hearing loss, sore throat and tinnitus.   Eyes: Negative for blurred vision and double vision.  Respiratory: Negative for cough, hemoptysis, sputum production, shortness of breath, wheezing and stridor.   Cardiovascular: Positive for chest pain. Negative for palpitations, orthopnea, leg swelling and PND.  Gastrointestinal: Negative for abdominal pain, constipation, diarrhea, heartburn, nausea and vomiting.  Genitourinary: Negative for dysuria, hematuria and urgency.  Musculoskeletal: Negative for joint pain and myalgias.  Skin: Negative for itching and rash.  Neurological: Negative for dizziness, tingling, weakness and headaches.  Endo/Heme/Allergies: Negative for environmental allergies. Does not bruise/bleed easily.  Psychiatric/Behavioral: Negative for depression. The patient is nervous/anxious. The patient does not have insomnia.   All other systems reviewed and are negative.    Objective:  Physical Exam Vitals reviewed.  Constitutional:      General: She is not in acute distress.    Appearance: She is well-developed.  HENT:     Head: Normocephalic and atraumatic.  Eyes:     General: No scleral icterus.    Conjunctiva/sclera: Conjunctivae normal.     Pupils: Pupils are equal, round, and reactive to light.  Neck:     Vascular: No JVD.     Trachea: No tracheal deviation.  Cardiovascular:  Rate and Rhythm: Normal rate and regular rhythm.     Heart sounds: Murmur heard.    Pulmonary:      Effort: Pulmonary effort is normal. No tachypnea, accessory muscle usage or respiratory distress.     Breath sounds: No stridor. No wheezing, rhonchi or rales.     Comments: Diminished breath sounds bilaterally Abdominal:     General: Bowel sounds are normal. There is no distension.     Palpations: Abdomen is soft.     Tenderness: There is no abdominal tenderness.  Musculoskeletal:        General: No tenderness.     Cervical back: Neck supple.  Lymphadenopathy:     Cervical: No cervical adenopathy.  Skin:    General: Skin is warm and dry.     Capillary Refill: Capillary refill takes less than 2 seconds.     Findings: No rash.  Neurological:     Mental Status: She is alert and oriented to person, place, and time.  Psychiatric:        Behavior: Behavior normal.      Vitals:   02/15/21 1643  BP: 110/60  Pulse: (!) 111  Temp: 98.1 F (36.7 C)  TempSrc: Temporal  SpO2: 94%  Weight: 178 lb 6.4 oz (80.9 kg)  Height: $Remove'5\' 8"'bTjrtJu$  (1.727 m)   94% on RA BMI Readings from Last 3 Encounters:  02/15/21 27.13 kg/m  01/20/21 26.99 kg/m  01/07/21 25.99 kg/m   Wt Readings from Last 3 Encounters:  02/15/21 178 lb 6.4 oz (80.9 kg)  01/20/21 177 lb 8 oz (80.5 kg)  01/07/21 176 lb (79.8 kg)     CBC    Component Value Date/Time   WBC 7.6 12/17/2020 1029   RBC 4.36 12/17/2020 1029   HGB 12.2 12/17/2020 1029   HGB 13.1 08/18/2020 1348   HGB 14.1 06/23/2020 1237   HCT 38.7 12/17/2020 1029   HCT 43.6 06/23/2020 1237   PLT 337 12/17/2020 1029   PLT 360 08/18/2020 1348   PLT 366 06/23/2020 1237   MCV 88.8 12/17/2020 1029   MCV 86 06/23/2020 1237   MCH 28.0 12/17/2020 1029   MCHC 31.5 12/17/2020 1029   RDW 13.6 12/17/2020 1029   RDW 13.2 06/23/2020 1237   LYMPHSABS 1.3 12/17/2020 1029   LYMPHSABS 2.2 06/23/2020 1237   MONOABS 0.7 12/17/2020 1029   EOSABS 0.2 12/17/2020 1029   EOSABS 0.2 06/23/2020 1237   BASOSABS 0.1 12/17/2020 1029   BASOSABS 0.1 06/23/2020 1237     Chest  Imaging:  Lung cancer screening CT October 2021: Bilateral cystic appearing masses concerning for multifocal adenocarcinoma. The patient's images have been independently reviewed by me.    CT chest 12/17/2020: Stable cystic lesion within the right base, left lower lobe cystic subsolid lesion consistent with previous adenocarcinoma is stable.  Also has associated moderate emphysema.. The patient's images have been independently reviewed by me.    Pulmonary Functions Testing Results: PFT Results Latest Ref Rng & Units 08/03/2020  FVC-Pre L 2.03  FVC-Predicted Pre % 67  FVC-Post L 2.15  FVC-Predicted Post % 71  Pre FEV1/FVC % % 67  Post FEV1/FCV % % 57  FEV1-Pre L 1.37  FEV1-Predicted Pre % 58  FEV1-Post L 1.22  DLCO uncorrected ml/min/mmHg 9.46  DLCO UNC% % 41  DLCO corrected ml/min/mmHg 9.26  DLCO COR %Predicted % 40  DLVA Predicted % 57  TLC L 5.13  TLC % Predicted % 90  RV % Predicted % 133  FeNO:   Pathology:   Echocardiogram:   Heart Catheterization:     Assessment & Plan:     ICD-10-CM   1. Adenocarcinoma, lung, left (HCC)  C34.92   2. Mass of lower lobe of right lung  R91.8   3. Centrilobular emphysema ([place])  J43.2   4. Stage 3 severe COPD by GOLD classification ([place])  J44.9   5. Former smoker  Z87.891     Discussion:  65 year old female, severe COPD, severely reduced DLCO, evidence of centrilobular emphysema on imaging, left lower lobe adenocarcinoma status post radiation, nondiagnostic cystic lesion on the right status post bronchoscopy.  Initially there was concern for multifocal bilateral adenocarcinoma.  One side was diagnostic the other was not.  Plan: Patient overall is doing well post radiation.  She has a 71-month noncontrasted CT of the chest in October 2022.  Her chest pains are improved.  I still suspect these are musculoskeletal.  Continue over-the-counter medications ice heat and supportive care. Continue Anora Ellipta Continue albuterol for  shortness of breath and wheezing.  Patient to follow-up with me in November 2022 after CT scan of the chest completed.  If lesion is persistent or growing in size and we need to consider repeat for tissue diagnosis I think patient will tolerate repeat bronchoscopy.   Current Outpatient Medications:  .  acetaminophen (TYLENOL) 650 MG CR tablet, Take 1,300 mg by mouth every 8 (eight) hours as needed for pain., Disp: , Rfl:  .  albuterol (VENTOLIN HFA) 108 (90 Base) MCG/ACT inhaler, Inhale 2 puffs into the lungs every 6 (six) hours as needed for wheezing or shortness of breath., Disp: 18 g, Rfl: 1 .  Ascorbic Acid (VITAMIN C ADULT GUMMIES PO), Take 1 tablet by mouth daily., Disp: , Rfl:  .  atorvastatin (LIPITOR) 40 MG tablet, Take 1 tablet (40 mg total) by mouth daily. (Patient taking differently: Take 40 mg by mouth at bedtime.), Disp: 90 tablet, Rfl: 3 .  azelastine (OPTIVAR) 0.05 % ophthalmic solution, Place 1 drop into both eyes 2 (two) times daily as needed for allergies., Disp: , Rfl:  .  carbamide peroxide (DEBROX) 6.5 % OTIC solution, Place 5 drops into both ears 2 (two) times daily., Disp: 15 mL, Rfl: 0 .  cetirizine (ZYRTEC) 10 MG tablet, Take 1 tablet (10 mg total) by mouth daily., Disp: 30 tablet, Rfl: 6 .  clobetasol cream (TEMOVATE) 7.34 %, Apply 1 application topically 2 (two) times daily as needed. (Patient taking differently: Apply 1 application topically 2 (two) times daily as needed (psoriasis).), Disp: 30 g, Rfl: 2 .  cyclobenzaprine (FLEXERIL) 10 MG tablet, TAKE 1 TABLET BY MOUTH TWICE DAILY AS NEEDED FOR MUSCLE SPASM, Disp: 20 tablet, Rfl: 0 .  diclofenac Sodium (VOLTAREN) 1 % GEL, Apply 2 g topically 4 (four) times daily. Apply to painful area of ribs. Need to use regularly for relief., Disp: 100 g, Rfl: 2 .  fluticasone (FLONASE) 50 MCG/ACT nasal spray, Place 2 sprays into both nostrils daily., Disp: 16 g, Rfl: 6 .  gabapentin (NEURONTIN) 300 MG capsule, Take 3 capsules (900  mg total) by mouth 3 (three) times daily. (Patient taking differently: Take 300-600 mg by mouth 2 (two) times daily as needed (pain).), Disp: 270 capsule, Rfl: 3 .  hydrOXYzine (ATARAX/VISTARIL) 50 MG tablet, Take 1 tablet (50 mg total) by mouth at bedtime as needed for anxiety or itching (sleep)., Disp: 30 tablet, Rfl: 1 .  Melatonin 10 MG TBCR, Take 10 mg by mouth  at bedtime., Disp: 90 tablet, Rfl: 2 .  pantoprazole (PROTONIX) 40 MG tablet, Take 1 tablet (40 mg total) by mouth daily., Disp: 30 tablet, Rfl: 3 .  polyethylene glycol powder (GLYCOLAX/MIRALAX) 17 GM/SCOOP powder, Take 17 g by mouth daily., Disp: 3350 g, Rfl: 1 .  venlafaxine XR (EFFEXOR-XR) 75 MG 24 hr capsule, TAKE 1 CAPSULE(75 MG) BY MOUTH DAILY WITH BREAKFAST, Disp: 90 capsule, Rfl: 0   [name], DO [facility] 02/15/2021 4:49 PM

## 2021-02-18 ENCOUNTER — Ambulatory Visit (INDEPENDENT_AMBULATORY_CARE_PROVIDER_SITE_OTHER): Payer: Medicare Other | Admitting: Family Medicine

## 2021-02-18 ENCOUNTER — Other Ambulatory Visit: Payer: Self-pay

## 2021-02-18 ENCOUNTER — Ambulatory Visit (HOSPITAL_COMMUNITY)
Admission: RE | Admit: 2021-02-18 | Discharge: 2021-02-18 | Disposition: A | Discharge status: Home / Self Care | Payer: Medicare Other | Source: Ambulatory Visit | Attending: Family Medicine | Admitting: Family Medicine

## 2021-02-18 VITALS — BP 143/74 | HR 120

## 2021-02-18 DIAGNOSIS — Z20822 Contact with and (suspected) exposure to covid-19: Secondary | ICD-10-CM

## 2021-02-18 DIAGNOSIS — J029 Acute pharyngitis, unspecified: Secondary | ICD-10-CM | POA: Diagnosis not present

## 2021-02-18 DIAGNOSIS — R000 Tachycardia, unspecified: Secondary | ICD-10-CM | POA: Insufficient documentation

## 2021-02-18 NOTE — Patient Instructions (Addendum)
It was great to see you! Thank you for allowing me to participate in your care!  Our plans for today:  -We are checking COVID-19 testing.  We should get the result back in 1 to 2 days and I will let you know the result when it returns. -If you develop any chest pains, trouble breathing, shortness of breath, vomiting to the extent that she cannot keep down fluids, or any other concerning symptoms I would like you to immediately let us know or go to the emergency department. - If the test comes back positive we will speak in more detail about the medication called Paxlovid.   We are checking some labs today, I will call you if they are abnormal will send you a MyChart message or a letter if they are normal.  If you do not hear about your labs in the next 2 weeks please let us know.  Take care and seek immediate care sooner if you develop any concerns.   Dr. Lurline Del, [place]

## 2021-02-18 NOTE — Assessment & Plan Note (Signed)
Assessment: 65 y.o. female with symptoms consistent with a viral upper respiratory tract infection.  Patient does not currently have any concerning symptoms to warrant hospitalization, though she does have a history of adenocarcinoma of her left lung and had recently had radiation treatment.  She has had some bouts of shortness of breath including earlier today when getting out of the shower which improved with a breathing treatment.  She states that she does feel somewhat short of breath with the mask on today but has an O2 sat of 98%.  Overall differential can include seasonal allergies with postnasal drip leading to the cough and runny nose versus viral URI which can include common cold, influenza, COVID-19, or number of other respiratory viruses.  Unlikely bacterial at this time though the patient could potentially develop sinus infection due to congestion.  Lungs sound clear with the only abnormality being decreased lung sounds in the left lower lobe which is the area that she had radiation. Plan: -We will send for COVID-19 testing -Discussed return precautions -Discussed symptomatic treatment  -I did discuss the patient's case in detail with our pharmacy team and with Dr. Andria Frames with the prospects of using Paxlovid should the patient COVID-19 testing come back positive.  If it does come back positive and the patient request using the medication, which I did speak to her about, she would need to hold her statin from the first day of the medication and resume it after the fifth day of the medication when she stops the Paxlovid.

## 2021-02-18 NOTE — Progress Notes (Signed)
SUBJECTIVE:   CHIEF COMPLAINT / HPI:   Sore throat: Patient is a 65 year old female that presents with sore throat for 1 days. She has also had congestion, some occasional bouts of feeling short of breath. No chest pains, no fever. She has had some nausea but no vomiting. No diarrhea, no sick contacts. She is vaccinated and boosted. She did note some shortness of breath when wearing the mask. She also felt a bit short of breath after getting out of the shower earlier which improved with a breathing treatment. Denies chest pain.    PERTINENT  PMH / PSH: History of adenocarcinoma of the left lung with radiation treatment.  OBJECTIVE:   BP (!) 143/74   Pulse (!) 120   SpO2 98%   Pulse recheck 110  General: Pleasant, able to participate in exam, well-appearing, cough present HEENT: No pharyngeal erythema Cardiac: Tachycardic but regular appearing rhythm, no murmurs. Respiratory: LLL area has reduced breath sounds, otherwise clear with no wheezing Abdomen: Bowel sounds present, nontender Skin: warm and dry, no rashes noted  ASSESSMENT/PLAN:   Sore throat Assessment: 65 y.o. female with symptoms consistent with a viral upper respiratory tract infection.  Patient does not currently have any concerning symptoms to warrant hospitalization, though she does have a history of adenocarcinoma of her left lung and had recently had radiation treatment.  She has had some bouts of shortness of breath including earlier today when getting out of the shower which improved with a breathing treatment.  She states that she does feel somewhat short of breath with the mask on today but has an O2 sat of 98%.  Overall differential can include seasonal allergies with postnasal drip leading to the cough and runny nose versus viral URI which can include common cold, influenza, COVID-19, or number of other respiratory viruses.  Unlikely bacterial at this time though the patient could potentially develop sinus  infection due to congestion.  Lungs sound clear with the only abnormality being decreased lung sounds in the left lower lobe which is the area that she had radiation. Plan: -We will send for COVID-19 testing -Discussed return precautions -Discussed symptomatic treatment  -I did discuss the patient's case in detail with our pharmacy team and with Dr. Andria Frames with the prospects of using Paxlovid should the patient COVID-19 testing come back positive.  If it does come back positive and the patient request using the medication, which I did speak to her about, she would need to hold her statin from the first day of the medication and resume it after the fifth day of the medication when she stops the Paxlovid.  Tachycardia: Heart rate elevated with initial check of 120, after the patient had rested through the encounter it was rechecked at about 110.  Appears regular on physical exam. We will check an EKG in the office today due to her persistent tachycardia.  EKG performed in office today which shows sinus tachycardia with a slight change in axis from her previous EKG back in April.  I did discuss this with Dr. Andria Frames who also took a look at the EKG and her previous one.  She has since had radiation treatment for adenocarcinoma since then.  EKG shows no arrhythmias, no ST changes, and the fact that the patient is not having any chest pains at this time I have low concern.  I recommended the patient continue hydration and treatment as discussed above.  Lurline Del, [place]

## 2021-02-19 ENCOUNTER — Other Ambulatory Visit: Payer: Self-pay | Admitting: Family Medicine

## 2021-02-19 ENCOUNTER — Other Ambulatory Visit (HOSPITAL_COMMUNITY): Payer: Self-pay

## 2021-02-19 LAB — NOVEL CORONAVIRUS, NAA: SARS-CoV-2, NAA: DETECTED — AB

## 2021-02-19 LAB — SARS-COV-2, NAA 2 DAY TAT

## 2021-02-19 MED ORDER — NIRMATRELVIR/RITONAVIR (PAXLOVID) TABLET (RENAL DOSING)
2.0000 | ORAL_TABLET | Freq: Two times a day (BID) | ORAL | 0 refills | Status: AC
  Filled 2021-02-19: qty 20, 5d supply, fill #0

## 2021-02-19 MED ORDER — NIRMATRELVIR/RITONAVIR (PAXLOVID)TABLET: 2.0000 | ORAL_TABLET | Freq: Two times a day (BID) | ORAL | 0 refills | Status: DC

## 2021-02-19 MED ORDER — NIRMATRELVIR/RITONAVIR (PAXLOVID)TABLET
3.0000 | ORAL_TABLET | Freq: Two times a day (BID) | ORAL | 0 refills | Status: DC
  Filled 2021-02-19: qty 30, 5d supply, fill #0

## 2021-02-19 NOTE — Progress Notes (Signed)
Discussed patient's COVID-19 positive status.  I had previously discussed her case with pharmacy who stated that she would be a candidate for Paxlovid.  Patient does take atorvastatin and I discussed with her on multiple occasions using teach back method that she needs to hold the atorvastatin from the first day that she starts the new medication until she completes the 5-day course.  She can then resume the statin after that.  I have reviewed her course and past medical history and do not see any other contraindications to that medication.  The patient is interested in taking the medication to help prevent hospitalization.  I will send this to the [facility] the patient plans to pick it up today.

## 2021-03-18 DIAGNOSIS — C678 Malignant neoplasm of overlapping sites of bladder: Secondary | ICD-10-CM | POA: Diagnosis not present

## 2021-03-18 DIAGNOSIS — R8271 Bacteriuria: Secondary | ICD-10-CM | POA: Diagnosis not present

## 2021-03-22 ENCOUNTER — Encounter: Payer: Self-pay | Admitting: Gastroenterology

## 2021-03-22 ENCOUNTER — Ambulatory Visit (INDEPENDENT_AMBULATORY_CARE_PROVIDER_SITE_OTHER): Payer: Medicare Other | Admitting: Gastroenterology

## 2021-03-22 VITALS — BP 156/78 | HR 80 | Ht 66.75 in | Wt 185.2 lb

## 2021-03-22 DIAGNOSIS — R1013 Epigastric pain: Secondary | ICD-10-CM | POA: Diagnosis not present

## 2021-03-22 DIAGNOSIS — Z1211 Encounter for screening for malignant neoplasm of colon: Secondary | ICD-10-CM | POA: Diagnosis not present

## 2021-03-22 NOTE — Progress Notes (Signed)
HPI : [name] is a pleasant 65 year old female with a history of recently diagnosed lung cancer and bladder cancer who was referred by Dr. [name] for upper abdominal/chest pain.  The patient states that she was having problems with the symptoms around the time of her radiation therapy for her lung cancer a few months ago, but the symptoms appear to have resolved.  Currently, she denies problems with upper abdominal pain, heartburn, nausea/vomiting or dysphagia.  She also denies lower abdominal pain, diarrhea or blood in her stool.  Occasionally she will have difficulty with hard stools or difficulty straining, but most the time she has at least 1-2 bowel movements per day.  She was prescribed MiraLAX for this, but she does not like to take it. She had a screening colonoscopy in August 2013 which was notable for a few small rectal hyperplastic polyps and she was recommended to repeat in 10 years.   Past Medical History:  Diagnosis Date   Abnormal CT lung screening 07/10/2020   Anxiety    Arthritis    Bladder cancer ([place]) 09/2020   urologist--- dr Tresa Moore, incidental finding on PET scan 12/ 2021,  s/p TURBT 09-18-2020 high grade papillary urothelial    Centrilobular emphysema ([place])    Close exposure to COVID-19 virus 05/03/2019   Constipation 04/09/2019   DDD (degenerative disc disease), lumbar    Depression    DOE (dyspnea on exertion)    10-29-2020  per pt can do house work without sob, when walks/ stairs stops frequently  (stated recovers quickly when stops)   Epidermoid cyst 10/29/2018   Recurrent. Located on left hip at iliac crest 2" anterior to midaxillary line. Removed 2013, 2020, 2022.    Epigastric pain 11/16/2020   Fibroid 2003   GERD (gastroesophageal reflux disease)    Leg cramping 11/13/2019   Menopausal vaginal dryness 11/13/2019   Primary adenocarcinoma of lower lobe of left lung ([place]) 07/2020   oncologist--- dr Julien Nordmann---  dx 11/ 2021 bilateral lower lobe masses,  s/p  bronchoscopy w/ bx's 07-28-2020 malignancy cells on left ;  completed SBRT bilateral lower lung 10-16-2020 5 fractions   Radiation-induced dermatitis    on 10-29-2020 pt complaint stomach and chest areas,  completed SBRT 10-16-2020   Seasonal allergies    Stage 3 severe COPD by GOLD classification [facility])    pulmonologist--- dr Valeta Harms,  using anoro inhaler daily   Wears glasses      Past Surgical History:  Procedure Laterality Date   ANTERIOR CERVICAL DECOMP/DISCECTOMY FUSION  03-08-2003 @ [place]   C3 --- C6   ARTHROTOMY Right 11/09/2015   Procedure: RIGHT WRIST DISTAL RADIAL ULNAR JOINT ARTHROTOMY AND DEBRIDEMENT,  AND ;  Surgeon: Iran Planas, MD;  Location: [place];  Service: Orthopedics;  Laterality: Right;   BRONCHIAL BIOPSY  07/28/2020   Procedure: BRONCHIAL BIOPSIES;  Surgeon: [name], DO;  Location: [place] [facility];  Service: Pulmonary;;   BRONCHIAL BRUSHINGS  07/28/2020   Procedure: BRONCHIAL BRUSHINGS;  Surgeon: [name], DO;  Location: [place] [facility];  Service: Pulmonary;;   BRONCHIAL NEEDLE ASPIRATION BIOPSY  07/28/2020   Procedure: BRONCHIAL NEEDLE ASPIRATION BIOPSIES;  Surgeon: [name], DO;  Location: [place];  Service: Pulmonary;;   BRONCHIAL WASHINGS  07/28/2020   Procedure: BRONCHIAL WASHINGS;  Surgeon: [name], DO;  Location: [place];  Service: Pulmonary;;   COLONOSCOPY     CYSTOSCOPY WITH RETROGRADE PYELOGRAM, URETEROSCOPY AND STENT PLACEMENT N/A 09/18/2020   Procedure: CYSTOSCOPY WITH  RETROGRADE PYELOGRAM bilateral,URETEROSCOPY AND  STENT PLACEMENT right ureter;  Surgeon: Alexis Frock, MD;  Location: [facility];  Service: Urology;  Laterality: N/A;  1 HR   HARDWARE REMOVAL Right 07/24/2013   Procedure: RIGHT WRIST HARDWARE REMOVAL, JOINT RELEASE, RIGHT HAND MANIPULATION UNDER ANESTHESIA;  Surgeon: Linna Hoff, MD;  Location: [place];  Service: Orthopedics;  Laterality: Right;   OPEN REDUCTION INTERNAL FIXATION (ORIF)  DISTAL RADIAL FRACTURE Right 11/08/2012   Procedure: OPEN REDUCTION INTERNAL FIXATION (ORIF) RIGHT DISTAL RADIUS FRACTURE;  Surgeon: Linna Hoff, MD;  Location: [place];  Service: Orthopedics;  Laterality: Right;   TRANSURETHRAL RESECTION OF BLADDER TUMOR N/A 09/18/2020   Procedure: TRANSURETHRAL RESECTION OF BLADDER TUMOR (TURBT);  Surgeon: Alexis Frock, MD;  Location: [facility];  Service: Urology;  Laterality: N/A;   TRANSURETHRAL RESECTION OF BLADDER TUMOR N/A 11/04/2020   Procedure: RESTAGING TRANSURETHRAL RESECTION OF BLADDER TUMOR (TURBT); BILATERAL RETROGRADE PYELOGRAM;  Surgeon: Alexis Frock, MD;  Location: [facility];  Service: Urology;  Laterality: N/A;  1 HR   VIDEO BRONCHOSCOPY WITH ENDOBRONCHIAL NAVIGATION N/A 07/28/2020   Procedure: VIDEO BRONCHOSCOPY WITH ENDOBRONCHIAL NAVIGATION;  Surgeon: [name], DO;  Location: [place];  Service: Pulmonary;  Laterality: N/A;   WRIST ARTHROPLASTY Right 11/09/2015   Procedure: OR DISTAL ULNAR RESECTION ARTHROPLASTY ;  Surgeon: Iran Planas, MD;  Location: [place];  Service: Orthopedics;  Laterality: Right;   WRIST ARTHROSCOPY WITH DEBRIDEMENT Right 07/21/2014   Procedure: RIGHT WRIST DISTAL RADIAL ULNAR JOINT DEBRIDEMENT AND JOINT RELEASE POSSIBLE TENDON INTERPOSITION;  Surgeon: Linna Hoff, MD;  Location: [place];  Service: Orthopedics;  Laterality: Right;   Family History  Problem Relation Age of Onset   Hypertension Mother    Hyperlipidemia Mother    Dementia Father    Lung cancer Father    Parkinson's disease Father    Early death Sister 31       Drug overdose   Throat cancer Brother    Hypertension Daughter    Diabetes Daughter    Social History   Tobacco Use   Smoking status: Former    Packs/day: 1.00    Years: 50.00    Pack years: 50.00    Types: Cigarettes    Start date: 04/13/1975    Quit date: 07/14/2020    Years since quitting: 0.6   Smokeless tobacco: Never  Vaping Use    Vaping Use: Never used  Substance Use Topics   Alcohol use: Yes    Comment: Holidays only   Drug use: Never   Current Outpatient Medications  Medication Sig Dispense Refill   acetaminophen (TYLENOL) 650 MG CR tablet Take 1,300 mg by mouth every 8 (eight) hours as needed for pain.     albuterol (VENTOLIN HFA) 108 (90 Base) MCG/ACT inhaler Inhale 2 puffs into the lungs every 6 (six) hours as needed for wheezing or shortness of breath. 18 g 1   Ascorbic Acid (VITAMIN C ADULT GUMMIES PO) Take 1 tablet by mouth daily.     atorvastatin (LIPITOR) 40 MG tablet Take 1 tablet (40 mg total) by mouth daily. (Patient taking differently: Take 40 mg by mouth at bedtime.) 90 tablet 3   azelastine (OPTIVAR) 0.05 % ophthalmic solution Place 1 drop into both eyes 2 (two) times daily as needed for allergies.     carbamide peroxide (DEBROX) 6.5 % OTIC solution Place 5 drops into both ears 2 (two) times daily. 15 mL 0   cetirizine (  ZYRTEC) 10 MG tablet Take 1 tablet (10 mg total) by mouth daily. 30 tablet 6   chlorhexidine (PERIDEX) 0.12 % solution SMARTSIG:0.5 Ounce(s) By Mouth Twice Daily     clobetasol cream (TEMOVATE) 1.02 % Apply 1 application topically 2 (two) times daily as needed. (Patient taking differently: Apply 1 application topically 2 (two) times daily as needed (psoriasis).) 30 g 2   cyclobenzaprine (FLEXERIL) 10 MG tablet TAKE 1 TABLET BY MOUTH TWICE DAILY AS NEEDED FOR MUSCLE SPASM 20 tablet 0   diclofenac Sodium (VOLTAREN) 1 % GEL Apply 2 g topically 4 (four) times daily. Apply to painful area of ribs. Need to use regularly for relief. 100 g 2   fluticasone (FLONASE) 50 MCG/ACT nasal spray Place 2 sprays into both nostrils daily. 16 g 6   gabapentin (NEURONTIN) 300 MG capsule Take 3 capsules (900 mg total) by mouth 3 (three) times daily. (Patient taking differently: Take 300-600 mg by mouth 2 (two) times daily as needed (pain).) 270 capsule 3   hydrOXYzine (ATARAX/VISTARIL) 50 MG tablet Take 1  tablet (50 mg total) by mouth at bedtime as needed for anxiety or itching (sleep). 30 tablet 1   Melatonin 10 MG TBCR Take 10 mg by mouth at bedtime. 90 tablet 2   pantoprazole (PROTONIX) 40 MG tablet Take 1 tablet (40 mg total) by mouth daily. 30 tablet 3   polyethylene glycol powder (GLYCOLAX/MIRALAX) 17 GM/SCOOP powder Take 17 g by mouth daily. 3350 g 1   venlafaxine XR (EFFEXOR-XR) 75 MG 24 hr capsule TAKE 1 CAPSULE(75 MG) BY MOUTH DAILY WITH BREAKFAST 90 capsule 0   No current facility-administered medications for this visit.   Allergies  Allergen Reactions   Codeine Nausea And Vomiting and Other (See Comments)    Hallucinations   Prednisone Other (See Comments)    'Made me feel funny inside my head"-pt cannot be specific     Review of Systems: All systems reviewed and negative except where noted in HPI.    No results found.  Physical Exam: BP (!) 156/78 (BP Location: Left Arm, Patient Position: Sitting, Cuff Size: Normal)   Pulse 80   Ht 5' 6.75" (1.695 m) Comment: height measured without shoes  Wt 185 lb 4 oz (84 kg)   BMI 29.23 kg/m  Constitutional: Pleasant,well-developed, African-American female in no acute distress. HEENT: Normocephalic and atraumatic. Conjunctivae are normal. No scleral icterus. Neck supple.  Cardiovascular: Normal rate, regular rhythm.  Pulmonary/chest: Effort normal and breath sounds normal. No wheezing, rales or rhonchi. Abdominal: Soft, nondistended, nontender. Bowel sounds active throughout. There are no masses palpable. No hepatomegaly. Extremities: no edema Lymphadenopathy: No cervical adenopathy noted. Neurological: Alert and oriented to person place and time. Skin: Skin is warm and dry. No rashes noted. Psychiatric: Normal mood and affect. Behavior is normal.  CBC    Component Value Date/Time   WBC 7.6 12/17/2020 1029   RBC 4.36 12/17/2020 1029   HGB 12.2 12/17/2020 1029   HGB 13.1 08/18/2020 1348   HGB 14.1 06/23/2020 1237   HCT  38.7 12/17/2020 1029   HCT 43.6 06/23/2020 1237   PLT 337 12/17/2020 1029   PLT 360 08/18/2020 1348   PLT 366 06/23/2020 1237   MCV 88.8 12/17/2020 1029   MCV 86 06/23/2020 1237   MCH 28.0 12/17/2020 1029   MCHC 31.5 12/17/2020 1029   RDW 13.6 12/17/2020 1029   RDW 13.2 06/23/2020 1237   LYMPHSABS 1.3 12/17/2020 1029   LYMPHSABS 2.2 06/23/2020 1237  MONOABS 0.7 12/17/2020 1029   EOSABS 0.2 12/17/2020 1029   EOSABS 0.2 06/23/2020 1237   BASOSABS 0.1 12/17/2020 1029   BASOSABS 0.1 06/23/2020 1237    CMP     Component Value Date/Time   NA 138 12/17/2020 1029   NA 142 06/23/2020 1237   K 3.8 12/17/2020 1029   CL 104 12/17/2020 1029   CO2 26 12/17/2020 1029   GLUCOSE 90 12/17/2020 1029   BUN 15 12/17/2020 1029   BUN 19 06/23/2020 1237   CREATININE 1.06 (H) 12/17/2020 1029   CREATININE 1.05 (H) 08/18/2020 1348   CREATININE 0.95 02/10/2015 1526   CALCIUM 9.0 12/17/2020 1029   PROT 6.8 12/17/2020 1029   PROT 6.8 10/30/2017 1120   ALBUMIN 3.8 12/17/2020 1029   ALBUMIN 4.2 10/30/2017 1120   AST 17 12/17/2020 1029   AST 25 08/18/2020 1348   ALT 20 12/17/2020 1029   ALT 37 08/18/2020 1348   ALKPHOS 73 12/17/2020 1029   BILITOT 1.0 12/17/2020 1029   BILITOT 1.0 08/18/2020 1348   GFRNONAA 58 (L) 12/17/2020 1029   GFRNONAA 59 (L) 08/18/2020 1348   GFRAA 69 06/23/2020 1237     ASSESSMENT AND PLAN: 65 year old female with heavy tobacco history and recent diagnosis of lung adenocarcinoma and bladder cancer referred for epigastric pain and GERD symptoms.  However, the patient reports to me that she is no longer having any of these symptoms and currently denies any bothersome symptoms of the GI tract.  She has prescribed pantoprazole, but is not taking it regularly.  Given the resolution of her symptoms, I am not inclined to pursue further evaluation at this time.  However, if the patient's symptoms return, she can make a follow-up visit we can discuss performing upper  endoscopy.  Regarding screening colonoscopy, the patient will be due for her next screening colonoscopy in August 2023.  I offered doing the procedure early if she wanted to, but the patient declined.  She is not having any symptoms that would warrant performing colonoscopy before then, so I think it is reasonable just to wait until she is due per guideline recommendations.    [name], MD

## 2021-03-22 NOTE — Patient Instructions (Signed)
If you are age 66 or older, your body mass index should be between 23-30. Your Body mass index is 29.23 kg/m. If this is out of the aforementioned range listed, please consider follow up with your Primary Care Provider.  If you are age 95 or younger, your body mass index should be between 19-25. Your Body mass index is 29.23 kg/m. If this is out of the aformentioned range listed, please consider follow up with your Primary Care Provider.   Follow up as needed.  The [place] [facility] providers would like to encourage you to use [facility] to communicate with providers for non-urgent requests or questions.  Due to long hold times on the telephone, sending your provider a message by [facility] may be a faster and more efficient way to get a response.  Please allow 48 business hours for a response.  Please remember that this is for non-urgent requests.   It was a pleasure to see you today!  Thank you for trusting me with your gastrointestinal care!     Scott E. Candis Schatz, MD

## 2021-04-05 DIAGNOSIS — H25813 Combined forms of age-related cataract, bilateral: Secondary | ICD-10-CM | POA: Diagnosis not present

## 2021-04-05 DIAGNOSIS — H1013 Acute atopic conjunctivitis, bilateral: Secondary | ICD-10-CM | POA: Diagnosis not present

## 2021-04-16 ENCOUNTER — Telehealth: Payer: Self-pay | Admitting: Pulmonary Disease

## 2021-04-16 NOTE — Telephone Encounter (Signed)
Called and spoke to patient.  She doesn't remember the name to her Oncologist and I can't find in chart. She is calling because she has a painful bruise from the radiation and it is painful to the touch  Looks like Dr. Sondra Come from chart, will route this message to him .

## 2021-04-21 ENCOUNTER — Encounter (HOSPITAL_COMMUNITY): Payer: Self-pay

## 2021-04-21 ENCOUNTER — Ambulatory Visit (HOSPITAL_COMMUNITY)
Admission: EM | Admit: 2021-04-21 | Discharge: 2021-04-21 | Disposition: A | Discharge status: Home / Self Care | Payer: Medicare Other | Attending: Medical Oncology | Admitting: Medical Oncology

## 2021-04-21 ENCOUNTER — Ambulatory Visit (INDEPENDENT_AMBULATORY_CARE_PROVIDER_SITE_OTHER): Payer: Medicare Other

## 2021-04-21 ENCOUNTER — Other Ambulatory Visit: Payer: Self-pay

## 2021-04-21 DIAGNOSIS — M25512 Pain in left shoulder: Secondary | ICD-10-CM | POA: Diagnosis not present

## 2021-04-21 DIAGNOSIS — M542 Cervicalgia: Secondary | ICD-10-CM

## 2021-04-21 DIAGNOSIS — M25519 Pain in unspecified shoulder: Secondary | ICD-10-CM

## 2021-04-21 MED ORDER — MELOXICAM 7.5 MG PO TABS: 7.5000 mg | ORAL_TABLET | Freq: Every day | ORAL | 0 refills | Status: DC

## 2021-04-21 NOTE — ED Triage Notes (Signed)
Pt report left shoulder pain x 1 week. Tylenol and muscle relaxer gives no relief. Denies chest pain, weakness, shortness of breath.

## 2021-04-21 NOTE — ED Provider Notes (Signed)
[place]    CSN: [id number] Arrival date & time: 04/21/21  1041      History   Chief Complaint Chief Complaint  Patient presents with   Shoulder Pain    HPI [name] is a 65 y.o. female.   HPI  Shoulder Pain: Patient states that they have had left shoulder pain for the past week.  Feels like an aching sharp pain that is fairly constant in nature.  It does worsen with some range of motion exercises and if she stretches down her shoulders.  No known injury.  She has tried muscle relaxers, Tylenol and her gabapentin without any improvement.  She denies any loss of strength, numbness or tingling, swelling of the arms.  No chest pain, shortness of breath.   Past Medical History:  Diagnosis Date   Abnormal CT lung screening 07/10/2020   Anxiety    Arthritis    Bladder cancer ([place]) 09/2020   urologist--- dr Tresa [facility], incidental finding on PET scan 12/ 2021,  s/p TURBT 09-18-2020 high grade papillary urothelial    Centrilobular emphysema ([place])    Close exposure to COVID-19 virus 05/03/2019   Constipation 04/09/2019   DDD (degenerative disc disease), lumbar    Depression    DOE (dyspnea on exertion)    10-29-2020  per pt can do house work without sob, when walks/ stairs stops frequently  (stated recovers quickly when stops)   Epidermoid cyst 10/29/2018   Recurrent. Located on left hip at iliac crest 2" anterior to midaxillary line. Removed 2013, 2020, 2022.    Epigastric pain 11/16/2020   Fibroid 2003   GERD (gastroesophageal reflux disease)    Leg cramping 11/13/2019   Menopausal vaginal dryness 11/13/2019   Primary adenocarcinoma of lower lobe of left lung ([place]) 07/2020   oncologist--- dr Julien Nordmann---  dx 11/ 2021 bilateral lower lobe masses,  s/p bronchoscopy w/ bx's 07-28-2020 malignancy cells on left ;  completed SBRT bilateral lower lung 10-16-2020 5 fractions   Radiation-induced dermatitis    on 10-29-2020 pt complaint stomach and chest areas,  completed SBRT  10-16-2020   Seasonal allergies    Stage 3 severe COPD by GOLD classification [facility])    pulmonologist--- dr Valeta Harms,  using anoro inhaler daily   Wears glasses     Patient Active Problem List   Diagnosis Date Noted   Sore throat 02/18/2021   Osteopenia after menopause 01/13/2021   Pulmonary emphysema ([place]) 10/07/2020   Primary adenocarcinoma of lower lobe of left lung ([place]) 08/18/2020   Mass of urinary bladder 08/18/2020   Costochondritis, acute 08/14/2020   Lesion of left lung 07/28/2020   Lesion of right lung 07/28/2020   Nocturnal enuresis 06/23/2020   Elevated serum creatinine 10/22/2019   Chronic hand pain, right 03/26/2019   Chronic bilateral low back pain without sciatica 02/08/2018   Dyslipidemia 12/14/2017   Fatigue 10/30/2017   Oral herpes 10/19/2015   Healthcare maintenance 06/08/2015   Arthritis of spine 03/27/2015   Lumbar spine pain 01/09/2015   Hot flashes, menopausal 02/28/2012   Anxiety state 02/28/2012   Restless legs 02/28/2012    Past Surgical History:  Procedure Laterality Date   ANTERIOR CERVICAL DECOMP/DISCECTOMY FUSION  03-08-2003 @ [facility]   C3 --- C6   ARTHROTOMY Right 11/09/2015   Procedure: RIGHT WRIST DISTAL RADIAL ULNAR JOINT ARTHROTOMY AND DEBRIDEMENT,  AND ;  Surgeon: Iran Planas, MD;  Location: [place];  Service: Orthopedics;  Laterality: Right;   BRONCHIAL BIOPSY  07/28/2020  Procedure: BRONCHIAL BIOPSIES;  Surgeon: [name], DO;  Location: [facility];  Service: Pulmonary;;   BRONCHIAL BRUSHINGS  07/28/2020   Procedure: BRONCHIAL BRUSHINGS;  Surgeon: [name], DO;  Location: [facility];  Service: Pulmonary;;   BRONCHIAL NEEDLE ASPIRATION BIOPSY  07/28/2020   Procedure: BRONCHIAL NEEDLE ASPIRATION BIOPSIES;  Surgeon: [name], DO;  Location: [place];  Service: Pulmonary;;   BRONCHIAL WASHINGS  07/28/2020   Procedure: BRONCHIAL WASHINGS;  Surgeon: [name], DO;  Location: [place];  Service: Pulmonary;;    COLONOSCOPY     CYSTOSCOPY WITH RETROGRADE PYELOGRAM, URETEROSCOPY AND STENT PLACEMENT N/A 09/18/2020   Procedure: CYSTOSCOPY WITH RETROGRADE PYELOGRAM bilateral,URETEROSCOPY AND  STENT PLACEMENT right ureter;  Surgeon: Alexis Frock, MD;  Location: [facility];  Service: Urology;  Laterality: N/A;  1 HR   HARDWARE REMOVAL Right 07/24/2013   Procedure: RIGHT WRIST HARDWARE REMOVAL, JOINT RELEASE, RIGHT HAND MANIPULATION UNDER ANESTHESIA;  Surgeon: Linna Hoff, MD;  Location: [place];  Service: Orthopedics;  Laterality: Right;   OPEN REDUCTION INTERNAL FIXATION (ORIF) DISTAL RADIAL FRACTURE Right 11/08/2012   Procedure: OPEN REDUCTION INTERNAL FIXATION (ORIF) RIGHT DISTAL RADIUS FRACTURE;  Surgeon: Linna Hoff, MD;  Location: [place];  Service: Orthopedics;  Laterality: Right;   TRANSURETHRAL RESECTION OF BLADDER TUMOR N/A 09/18/2020   Procedure: TRANSURETHRAL RESECTION OF BLADDER TUMOR (TURBT);  Surgeon: Alexis Frock, MD;  Location: [facility];  Service: Urology;  Laterality: N/A;   TRANSURETHRAL RESECTION OF BLADDER TUMOR N/A 11/04/2020   Procedure: RESTAGING TRANSURETHRAL RESECTION OF BLADDER TUMOR (TURBT); BILATERAL RETROGRADE PYELOGRAM;  Surgeon: Alexis Frock, MD;  Location[facility];  Service: Urology;  Laterality: N/A;  1 HR   VIDEO BRONCHOSCOPY WITH ENDOBRONCHIAL NAVIGATION N/A 07/28/2020   Procedure: VIDEO BRONCHOSCOPY WITH ENDOBRONCHIAL NAVIGATION;  Surgeon: [name], DO;  Location: [place];  Service: Pulmonary;  Laterality: N/A;   WRIST ARTHROPLASTY Right 11/09/2015   Procedure: OR DISTAL ULNAR RESECTION ARTHROPLASTY ;  Surgeon: Iran Planas, MD;  Location: [place];  Service: Orthopedics;  Laterality: Right;   WRIST ARTHROSCOPY WITH DEBRIDEMENT Right 07/21/2014   Procedure: RIGHT WRIST DISTAL RADIAL ULNAR JOINT DEBRIDEMENT AND JOINT RELEASE POSSIBLE TENDON INTERPOSITION;  Surgeon: Linna Hoff, MD;  Location: [place];   Service: Orthopedics;  Laterality: Right;    OB History     Gravida  2   Para  2   Term  2   Preterm      AB      Living  2      SAB      IAB      Ectopic      Multiple      Live Births               Home Medications    Prior to Admission medications   Medication Sig Start Date End Date Taking? Authorizing Provider  acetaminophen (TYLENOL) 650 MG CR tablet Take 1,300 mg by mouth every 8 (eight) hours as needed for pain.    [provider]  albuterol (VENTOLIN HFA) 108 (90 Base) MCG/ACT inhaler Inhale 2 puffs into the lungs every 6 (six) hours as needed for wheezing or shortness of breath. 02/05/21 05/06/21  [name], MD  Ascorbic Acid (VITAMIN C ADULT GUMMIES PO) Take 1 tablet by mouth daily.    [provider]  atorvastatin (LIPITOR) 40 MG tablet Take 1 tablet (40 mg total) by mouth daily. Patient  taking differently: Take 40 mg by mouth at bedtime. 10/14/20   [name], MD  azelastine (OPTIVAR) 0.05 % ophthalmic solution Place 1 drop into both eyes 2 (two) times daily as needed for allergies. 02/18/20   [provider]  carbamide peroxide (DEBROX) 6.5 % OTIC solution Place 5 drops into both ears 2 (two) times daily. 06/23/20   [name], MD  cetirizine (ZYRTEC) 10 MG tablet Take 1 tablet (10 mg total) by mouth daily. 01/07/21   Zenia Resides, MD  chlorhexidine (PERIDEX) 0.12 % solution SMARTSIG:0.5 Ounce(s) By Mouth Twice Daily 10/26/20   [provider]  clobetasol cream (TEMOVATE) 8.78 % Apply 1 application topically 2 (two) times daily as needed. Patient taking differently: Apply 1 application topically 2 (two) times daily as needed (psoriasis). 11/09/17   Mercy Riding, MD  cyclobenzaprine (FLEXERIL) 10 MG tablet TAKE 1 TABLET BY MOUTH TWICE DAILY AS NEEDED FOR MUSCLE SPASM 02/11/21   [name], MD  diclofenac Sodium (VOLTAREN) 1 % GEL Apply 2 g topically 4 (four) times daily. Apply to painful area of ribs.  Need to use regularly for relief. 01/19/21   [name], MD  fluticasone [facility]) 50 MCG/ACT nasal spray Place 2 sprays into both nostrils daily. 01/07/21   Zenia Resides, MD  gabapentin (NEURONTIN) 300 MG capsule Take 3 capsules (900 mg total) by mouth 3 (three) times daily. Patient taking differently: Take 300-600 mg by mouth 2 (two) times daily as needed (pain). 11/13/19   Kathrene Alu, MD  hydrOXYzine (ATARAX/VISTARIL) 50 MG tablet Take 1 tablet (50 mg total) by mouth at bedtime as needed for anxiety or itching (sleep). 10/07/20   Meccariello, Bernita Raisin, DO  Melatonin 10 MG TBCR Take 10 mg by mouth at bedtime. 06/23/20   [name], MD  pantoprazole (PROTONIX) 40 MG tablet Take 1 tablet (40 mg total) by mouth daily. 11/16/20   Zenia Resides, MD  polyethylene glycol powder (GLYCOLAX/MIRALAX) 17 GM/SCOOP powder Take 17 g by mouth daily. 12/31/20   [name], MD  venlafaxine XR (EFFEXOR-XR) 75 MG 24 hr capsule TAKE 1 CAPSULE(75 MG) BY MOUTH DAILY WITH BREAKFAST 01/04/21   [name], MD    Family History Family History  Problem Relation Age of Onset   Hypertension Mother    Hyperlipidemia Mother    Dementia Father    Lung cancer Father    Parkinson's disease Father    Early death Sister 15       Drug overdose   Throat cancer Brother    Hypertension Daughter    Diabetes Daughter     Social History Social History   Tobacco Use   Smoking status: Former    Packs/day: 1.00    Years: 50.00    Pack years: 50.00    Types: Cigarettes    Start date: 04/13/1975    Quit date: 07/14/2020    Years since quitting: 0.7   Smokeless tobacco: Never  Vaping Use   Vaping Use: Never used  Substance Use Topics   Alcohol use: Yes    Comment: Holidays only   Drug use: Never     Allergies   Codeine and Prednisone   Review of Systems Review of Systems  As stated above in HPI Physical Exam Triage Vital Signs ED Triage Vitals [04/21/21 1157]  Enc Vitals Group      BP (!) 158/106     Pulse Rate 87     Resp 18     Temp  97.7 F (36.5 C)     Temp Source Oral     SpO2 98 %     Weight      Height      Head Circumference      Peak Flow      Pain Score      Pain Loc      Pain Edu?      Excl. in [place]?    No data found.  Updated Vital Signs BP (!) 158/106 (BP Location: Left Arm)   Pulse 87   Temp 97.7 F (36.5 C) (Oral)   Resp 18   SpO2 98%   Physical Exam Vitals and nursing note reviewed.  Constitutional:      General: She is not in acute distress.    Appearance: Normal appearance. She is not ill-appearing, toxic-appearing or diaphoretic.  Neck:     Comments: Slightly reduced range of motion of neck which patient reports is secondary to history of cervical surgeries. Cardiovascular:     Rate and Rhythm: Normal rate and regular rhythm.     Pulses: Normal pulses.     Heart sounds: Normal heart sounds.  Pulmonary:     Effort: Pulmonary effort is normal.     Breath sounds: Normal breath sounds.  Musculoskeletal:        General: Tenderness (Mild reproducible tenderness of the left shoulder) present. No swelling or deformity. Normal range of motion.       Arms:     Cervical back: No tenderness.  Skin:    General: Skin is warm.     Capillary Refill: Capillary refill takes less than 2 seconds.     Coloration: Skin is not pale.     Findings: No rash.  Neurological:     Mental Status: She is alert and oriented to person, place, and time.     Cranial Nerves: No cranial nerve deficit.     Motor: No weakness.     Coordination: Coordination normal.     UC Treatments / Results  Labs (all labs ordered are listed, but only abnormal results are displayed) Labs Reviewed - No data to display  EKG   Radiology No results found.  Procedures Procedures (including critical care time)  Medications Ordered in UC Medications - No data to display  Initial Impression / Assessment and Plan / UC Course  I have reviewed the triage vital signs  and the nursing notes.  Pertinent labs & imaging results that were available during my care of the patient were reviewed by me and considered in my medical decision making (see chart for details).     New.  Patient has failed high-strength muscle relaxers, Tylenol, gabapentin and is allergic to prednisone.  Discussed low-dose Toradol versus NSAIDs.  She is elected NSAIDs and we will proceed forward with Mobic x7 days.  She has follow-up on Monday with orthopedics.  Final Clinical Impressions(s) / UC Diagnoses   Final diagnoses:  None   Discharge Instructions   None    ED Prescriptions   None    PDMP not reviewed this encounter.   Hughie Closs, [place] 04/21/21 1313

## 2021-04-26 ENCOUNTER — Other Ambulatory Visit: Payer: Self-pay

## 2021-04-26 ENCOUNTER — Ambulatory Visit (INDEPENDENT_AMBULATORY_CARE_PROVIDER_SITE_OTHER): Payer: Medicare Other

## 2021-04-26 ENCOUNTER — Ambulatory Visit (INDEPENDENT_AMBULATORY_CARE_PROVIDER_SITE_OTHER): Payer: Medicare Other | Admitting: Family Medicine

## 2021-04-26 VITALS — BP 113/80 | HR 84 | Ht 68.0 in | Wt 184.8 lb

## 2021-04-26 DIAGNOSIS — Z23 Encounter for immunization: Secondary | ICD-10-CM | POA: Diagnosis not present

## 2021-04-26 DIAGNOSIS — M25512 Pain in left shoulder: Secondary | ICD-10-CM

## 2021-04-26 MED ORDER — LIDOCAINE 5 % EX PTCH: 1.0000 | MEDICATED_PATCH | CUTANEOUS | 0 refills | Status: DC

## 2021-04-26 NOTE — Progress Notes (Signed)
   SUBJECTIVE:   CHIEF COMPLAINT / HPI:   Chief Complaint  Patient presents with   Shoulder Pain   Back pain     [name] is a 65 y.o. female here upper back pain that radiates to her elbow. Pain has been present for 3 weeks. She also complains of shoulder pain. Pt seen at urgent care last week for shoulder pain.  She was to follow up with ortho today. Pain radiates to her left elbow. States her pain moves around. She is taking Mobic for pain.  Taking Tylenol 650 mg twice a day.      PERTINENT  PMH / PSH: reviewed and updated as appropriate   OBJECTIVE:   BP 113/80   Pulse 84   Ht 5\' 8"  (1.727 m)   Wt 184 lb 12.8 oz (83.8 kg)   SpO2 97%   BMI 28.10 kg/m    GEN: pleasant older female, in no acute distress  CV: regular rate well perfused  RESP: no increased work of breathing MSK: no edema, or cyanosis noted Shoulder, left: No evidence of bony deformity, asymmetry, or muscle atrophy; No tenderness over long head of biceps (bicipital groove). No  TTP at [facility] joint. Full active and passive range of motion T1-12 without significant tenderness. Moderate point tenderness in the lower trapezius on the left border of the scapula. Strength 5/5 throughout. No abnormal scapular function observed. Sensation intact. Peripheral pulses intact. Negative Hawkins, negative Neers, negative Spurling's  SKIN: warm, dry    ASSESSMENT/PLAN:   Acute pain of left shoulder Acute shoulder pain for past 3 weeks. Urgent care notes reviewed. Patient has not yet followed up with ortho. Referral to sports medicine discussed and placed. Discussed shoulder stretches with patient and husband. Myofascial release demonstrated for husband to perform at home. Discontinue Mobic as pt reports no improvement. Tylenol arthritis helping. Increase to 1500 mg BID. Lidocaine patches ordered. Pain is radicular in nature. Cervical xray was negative. Recommended relative rest until she sees sports medicine.      Lyndee Hensen, DO PGY-3, [facility] 04/26/2021

## 2021-04-26 NOTE — Patient Instructions (Signed)
It was great seeing you today! Your shoulder pain is likely due to muscle strain.   As discusssed:  - Take 2 tablets of Tylenol (1300 mg) twice a day.  - Have your husband help with massage and stretching of shoulder. Ice afterwards!  - Stop by the pharmacy to pick up lidocaine patches. They are also available at the dollar store.  - Do not lift anything greater than a gallon of milk for the next 1-2 weeks to allow you shoulder time to heal.  - Look out for a call to schedule an appointment with the sports medicine doctors.     Take care,   [place]

## 2021-04-29 DIAGNOSIS — G8929 Other chronic pain: Secondary | ICD-10-CM | POA: Insufficient documentation

## 2021-04-29 DIAGNOSIS — M25512 Pain in left shoulder: Secondary | ICD-10-CM | POA: Insufficient documentation

## 2021-04-29 HISTORY — DX: Other chronic pain: G89.29

## 2021-04-29 NOTE — Assessment & Plan Note (Signed)
Acute shoulder pain for past 3 weeks. Urgent care notes reviewed. Patient has not yet followed up with ortho. Referral to sports medicine discussed and placed. Discussed shoulder stretches with patient and husband. Myofascial release demonstrated for husband to perform at home. Discontinue Mobic as pt reports no improvement. Tylenol arthritis helping. Increase to 1500 mg BID. Lidocaine patches ordered. Pain is radicular in nature. Cervical xray was negative. Recommended relative rest until she sees sports medicine.

## 2021-04-30 ENCOUNTER — Ambulatory Visit: Payer: Medicare Other | Admitting: Family Medicine

## 2021-05-07 ENCOUNTER — Other Ambulatory Visit: Payer: Self-pay

## 2021-05-07 ENCOUNTER — Ambulatory Visit (INDEPENDENT_AMBULATORY_CARE_PROVIDER_SITE_OTHER): Payer: Medicare Other | Admitting: Family Medicine

## 2021-05-07 VITALS — Ht 68.5 in | Wt 174.0 lb

## 2021-05-07 DIAGNOSIS — G8929 Other chronic pain: Secondary | ICD-10-CM

## 2021-05-07 DIAGNOSIS — M25512 Pain in left shoulder: Secondary | ICD-10-CM

## 2021-05-07 NOTE — Progress Notes (Signed)
PCP: [name], MD  Subjective:   HPI: Patient is a 65 y.o. female here for evaluation of left shoulder pain x 1 month.  Patient has a history of Stage IIA (T2b, N0, M0) non-small cell lung cancer, adenocarcinoma of the left lower lobe with suspicious right lower lobe cystic lesion.  She reports left sided shoulder and scapular pain x1 month.  She states that over the past 6 months or so she has had left-sided rib and shoulder pain status post radiation therapy for her lung cancer.  However, over the last month her pain is localized more so to the shoulder and posterior scapula as well.  She denies any inciting event or trauma.  She saw her primary care doctor for this who referred her to sports medicine and gave her a trial of Mobic and lidocaine patches.  In terms of her history of lung cancer, she has underwent 10 radiation treatment therapies, but reports she has been done with these for a few months.  The patient is unsure whether or not she is in remission from her lung cancer or not.  The patient denies any fever or chills or signs of systemic illness.  She denies any redness, ecchymosis or bruising of the shoulder.  She does report tightness of the left posterior shoulder.  This is worse with activity or lying on that side.  She denies any pain of the neck.  She states her pain radiates from the posterior shoulder anterolateral shoulder, and into the left rib cage, but this has been present for some time now.  She is taking Tylenol occasionally for the pain.  She is also taking gabapentin for her radiation-induced neuropathy, but reports she is on a low-dose.  She previously had a 41-pack-year smoking history.  She reports she quit around March 2022.  Past Medical History:  Diagnosis Date   Abnormal CT lung screening 07/10/2020   Anxiety    Arthritis    Bladder cancer ([place]) 09/2020   urologist--- dr Tresa Moore, incidental finding on PET scan 12/ 2021,  s/p TURBT 09-18-2020 high grade  papillary urothelial    Centrilobular emphysema ([place])    Close exposure to COVID-19 virus 05/03/2019   Constipation 04/09/2019   DDD (degenerative disc disease), lumbar    Depression    DOE (dyspnea on exertion)    10-29-2020  per pt can do house work without sob, when walks/ stairs stops frequently  (stated recovers quickly when stops)   Epidermoid cyst 10/29/2018   Recurrent. Located on left hip at iliac crest 2" anterior to midaxillary line. Removed 2013, 2020, 2022.    Epigastric pain 11/16/2020   Fibroid 2003   GERD (gastroesophageal reflux disease)    Leg cramping 11/13/2019   Menopausal vaginal dryness 11/13/2019   Primary adenocarcinoma of lower lobe of left lung ([place]) 07/2020   oncologist--- dr Julien Nordmann---  dx 11/ 2021 bilateral lower lobe masses,  s/p bronchoscopy w/ bx's 07-28-2020 malignancy cells on left ;  completed SBRT bilateral lower lung 10-16-2020 5 fractions   Radiation-induced dermatitis    on 10-29-2020 pt complaint stomach and chest areas,  completed SBRT 10-16-2020   Seasonal allergies    Stage 3 severe COPD by GOLD classification [facility])    pulmonologist--- dr Valeta Harms,  using anoro inhaler daily   Wears glasses     Current Outpatient Medications on File Prior to Visit  Medication Sig Dispense Refill   acetaminophen (TYLENOL) 650 MG CR tablet Take 1,300 mg by mouth every 8 (  eight) hours as needed for pain.     albuterol (VENTOLIN HFA) 108 (90 Base) MCG/ACT inhaler Inhale 2 puffs into the lungs every 6 (six) hours as needed for wheezing or shortness of breath. 18 g 1   Ascorbic Acid (VITAMIN C ADULT GUMMIES PO) Take 1 tablet by mouth daily.     atorvastatin (LIPITOR) 40 MG tablet Take 1 tablet (40 mg total) by mouth daily. (Patient taking differently: Take 40 mg by mouth at bedtime.) 90 tablet 3   azelastine (OPTIVAR) 0.05 % ophthalmic solution Place 1 drop into both eyes 2 (two) times daily as needed for allergies.     carbamide peroxide (DEBROX) 6.5 % OTIC solution Place 5  drops into both ears 2 (two) times daily. 15 mL 0   cetirizine (ZYRTEC) 10 MG tablet Take 1 tablet (10 mg total) by mouth daily. 30 tablet 6   chlorhexidine (PERIDEX) 0.12 % solution SMARTSIG:0.5 Ounce(s) By Mouth Twice Daily     clobetasol cream (TEMOVATE) 6.76 % Apply 1 application topically 2 (two) times daily as needed. (Patient taking differently: Apply 1 application topically 2 (two) times daily as needed (psoriasis).) 30 g 2   diclofenac Sodium (VOLTAREN) 1 % GEL Apply 2 g topically 4 (four) times daily. Apply to painful area of ribs. Need to use regularly for relief. 100 g 2   fluticasone (FLONASE) 50 MCG/ACT nasal spray Place 2 sprays into both nostrils daily. 16 g 6   gabapentin (NEURONTIN) 300 MG capsule Take 3 capsules (900 mg total) by mouth 3 (three) times daily. (Patient taking differently: Take 300-600 mg by mouth 2 (two) times daily as needed (pain).) 270 capsule 3   hydrOXYzine (ATARAX/VISTARIL) 50 MG tablet Take 1 tablet (50 mg total) by mouth at bedtime as needed for anxiety or itching (sleep). 30 tablet 1   lidocaine (LIDODERM) 5 % Place 1 patch onto the skin daily. Remove & Discard patch within 12 hours or as directed by MD 30 patch 0   Melatonin 10 MG TBCR Take 10 mg by mouth at bedtime. 90 tablet 2   pantoprazole (PROTONIX) 40 MG tablet Take 1 tablet (40 mg total) by mouth daily. 30 tablet 3   polyethylene glycol powder (GLYCOLAX/MIRALAX) 17 GM/SCOOP powder Take 17 g by mouth daily. 3350 g 1   venlafaxine XR (EFFEXOR-XR) 75 MG 24 hr capsule TAKE 1 CAPSULE(75 MG) BY MOUTH DAILY WITH BREAKFAST 90 capsule 0   No current facility-administered medications on file prior to visit.    Past Surgical History:  Procedure Laterality Date   ANTERIOR CERVICAL DECOMP/DISCECTOMY FUSION  03-08-2003 @ [facility]   C3 --- C6   ARTHROTOMY Right 11/09/2015   Procedure: RIGHT WRIST DISTAL RADIAL ULNAR JOINT ARTHROTOMY AND DEBRIDEMENT,  AND ;  Surgeon: Iran Planas, MD;  Location: [facility];  Service:  Orthopedics;  Laterality: Right;   BRONCHIAL BIOPSY  07/28/2020   Procedure: BRONCHIAL BIOPSIES;  Surgeon: [name], DO;  Location: [facility];  Service: Pulmonary;;   BRONCHIAL BRUSHINGS  07/28/2020   Procedure: BRONCHIAL BRUSHINGS;  Surgeon: [name], DO;  Location: [facility];  Service: Pulmonary;;   BRONCHIAL NEEDLE ASPIRATION BIOPSY  07/28/2020   Procedure: BRONCHIAL NEEDLE ASPIRATION BIOPSIES;  Surgeon: [name], DO;  Location: [facility];  Service: Pulmonary;;   BRONCHIAL WASHINGS  07/28/2020   Procedure: BRONCHIAL WASHINGS;  Surgeon: [name], DO;  Location: [facility];  Service: Pulmonary;;   COLONOSCOPY     CYSTOSCOPY WITH RETROGRADE PYELOGRAM, URETEROSCOPY  AND STENT PLACEMENT N/A 09/18/2020   Procedure: CYSTOSCOPY WITH RETROGRADE PYELOGRAM bilateral,URETEROSCOPY AND  STENT PLACEMENT right ureter;  Surgeon: Alexis Frock, MD;  Location: [facility];  Service: Urology;  Laterality: N/A;  1 HR   HARDWARE REMOVAL Right 07/24/2013   Procedure: RIGHT WRIST HARDWARE REMOVAL, JOINT RELEASE, RIGHT HAND MANIPULATION UNDER ANESTHESIA;  Surgeon: Linna Hoff, MD;  Location: [facility];  Service: Orthopedics;  Laterality: Right;   OPEN REDUCTION INTERNAL FIXATION (ORIF) DISTAL RADIAL FRACTURE Right 11/08/2012   Procedure: OPEN REDUCTION INTERNAL FIXATION (ORIF) RIGHT DISTAL RADIUS FRACTURE;  Surgeon: Linna Hoff, MD;  Location: [facility];  Service: Orthopedics;  Laterality: Right;   TRANSURETHRAL RESECTION OF BLADDER TUMOR N/A 09/18/2020   Procedure: TRANSURETHRAL RESECTION OF BLADDER TUMOR (TURBT);  Surgeon: Alexis Frock, MD;  [facility];  Service: Urology;  Laterality: N/A;   TRANSURETHRAL RESECTION OF BLADDER TUMOR N/A 11/04/2020   Procedure: RESTAGING TRANSURETHRAL RESECTION OF BLADDER TUMOR (TURBT); BILATERAL RETROGRADE PYELOGRAM;  Surgeon: Alexis Frock, MD;  [facility];  Service: Urology;   Laterality: N/A;  1 HR   VIDEO BRONCHOSCOPY WITH ENDOBRONCHIAL NAVIGATION N/A 07/28/2020   Procedure: VIDEO BRONCHOSCOPY WITH ENDOBRONCHIAL NAVIGATION;  Surgeon: [name], DO;  Location: [facility];  Service: Pulmonary;  Laterality: N/A;   WRIST ARTHROPLASTY Right 11/09/2015   Procedure: OR DISTAL ULNAR RESECTION ARTHROPLASTY ;  Surgeon: Iran Planas, MD;  [facility];  Service: Orthopedics;  Laterality: Right;   WRIST ARTHROSCOPY WITH DEBRIDEMENT Right 07/21/2014   Procedure: RIGHT WRIST DISTAL RADIAL ULNAR JOINT DEBRIDEMENT AND JOINT RELEASE POSSIBLE TENDON INTERPOSITION;  Surgeon: Linna Hoff, MD;  Location: [facility];  Service: Orthopedics;  Laterality: Right;    Allergies  Allergen Reactions   Codeine Nausea And Vomiting and Other (See Comments)    Hallucinations   Prednisone Other (See Comments)    'Made me feel funny inside my head"-pt cannot be specific    Social History   Socioeconomic History   Marital status: Married    Spouse name: Mallie Mussel [place]ama   Number of children: 2   Years of education: 12   Highest education level: Not on file  Occupational History   Occupation: Unemployed     Employer:  [employer]    Comment: Sweep   Tobacco Use   Smoking status: Former    Packs/day: 1.00    Years: 50.00    Pack years: 50.00    Types: Cigarettes    Start date: 04/13/1975    Quit date: 07/14/2020    Years since quitting: 0.8   Smokeless tobacco: Never  Vaping Use   Vaping Use: Never used  Substance and Sexual Activity   Alcohol use: Yes    Comment: Holidays only   Drug use: Never   Sexual activity: Not Currently    Partners: Male    Birth control/protection: Post-menopausal  Other Topics Concern   Not on file  Social History Narrative   Patient lives alone currently in [place].    Patient is married, they are living separately at this time.    Patient enjoys watching tv, cooking, and spending time with her children and grandchildren.    Social  Determinants of Health   Financial Resource Strain: Not on file  Food Insecurity: Not on file  Transportation Needs: Not on file  Physical Activity: Not on file  Stress: Not on file  Social Connections: Not on file  Intimate Partner Violence: Not on file  Family History  Problem Relation Age of Onset   Hypertension Mother    Hyperlipidemia Mother    Dementia Father    Lung cancer Father    Parkinson's disease Father    Early death Sister 52       Drug overdose   Throat cancer Brother    Hypertension Daughter    Diabetes Daughter     Ht 5' 8.5" (1.74 m)   Wt 174 lb (78.9 kg)   BMI 26.07 kg/m   [place] Adult Exercise 05/07/2021  Frequency of aerobic exercise (# of days/week) 0  Average time in minutes 0  Frequency of strengthening activities (# of days/week) 3    No flowsheet data found.  Review of Systems: See HPI above.     Objective:  Physical Exam:  Gen: Well-appearing, in no acute distress; non-toxic CV: Regular Rate. Well-perfused. Warm.  Resp: Breathing unlabored on room air; no wheezing. Psych: Fluid speech in conversation; appropriate affect; normal thought process Neuro: Sensation intact throughout. No gross coordination deficits.  Psych: somewhat anxious appearing; normal affect MSK:   -Left shoulder: TTP noted within the musculature of the medial border of the left scapula.  This pain does radiate when palpated deeply.  No TTP over the spine of the scapula, AC, SA/SD area.  No evidence of bony deformity, asymmetry or muscle atrophy.  No scapular dyskinesis noted with full range of motion.  5/5 strength.  Full active and passive range of motion.  Sensation to light touch intact.  Peripheral pulses distally intact.  Negative provocative testing of the rotator cuff.    Assessment & Plan:  1. Left posterior shoulder pain 2. Trigger point, medial border of left scapula 3. Stage IIA (T2b, N0, M0) non-small cell lung cancer, adenocarcinoma  of the left lower lobe 4. History of tobacco use disorder  Procedure: A trigger point injection was performed at the site of maximal tenderness just medial to medial scapula border using 1% plain Lidocaine w/o. This was well tolerated, and followed by relief of pain.  A/P: The patient's pain is likely multifactorial.  She does have a notable trigger point within the medial border of the left scapula, so trigger point injection was performed today with relief.  Range of motion exercises were provided, such as scapular retraction, pendulum swings.  She may continue to heat and use Tylenol as needed.  The patient also has neuropathy pain likely secondary to her previous radiation.  She is on gabapentin 300 mg 3 times daily as needed, we discussed talking about this with her primary care doctor and when she sees [place] back we can consider a trial of increasing that dose to a therapeutic effect.  Given her history of lung cancer, I also ordered an x-ray of the left shoulder to rule out any bony metastasis that may be present.  We also discussed the importance of following up with her radiation oncologist, as they have repeat CT scans they would like to be performed for clinical monitoring and progression of her condition.  We will follow-up in a few weeks after she gets her x-ray and discuss the next treatment course.  She may follow-up sooner if any issues arise.  Laura Barman, DO PGY-4, Sports Medicine Fellow [place]

## 2021-05-09 NOTE — Progress Notes (Signed)
[facility]: Attending Note: I have reviewed the chart, discussed wit the Sports Medicine Fellow. I agree with assessment and treatment plan as detailed in the Fellow's note.  

## 2021-05-27 ENCOUNTER — Ambulatory Visit
Admission: RE | Admit: 2021-05-27 | Discharge: 2021-05-27 | Disposition: A | Discharge status: Home / Self Care | Payer: Medicare Other | Source: Ambulatory Visit | Attending: Family Medicine | Admitting: Family Medicine

## 2021-05-27 DIAGNOSIS — M25512 Pain in left shoulder: Secondary | ICD-10-CM

## 2021-05-27 DIAGNOSIS — M25712 Osteophyte, left shoulder: Secondary | ICD-10-CM | POA: Diagnosis not present

## 2021-05-27 DIAGNOSIS — M19012 Primary osteoarthritis, left shoulder: Secondary | ICD-10-CM | POA: Diagnosis not present

## 2021-05-27 DIAGNOSIS — G8929 Other chronic pain: Secondary | ICD-10-CM

## 2021-05-28 ENCOUNTER — Ambulatory Visit (INDEPENDENT_AMBULATORY_CARE_PROVIDER_SITE_OTHER): Payer: Medicare Other | Admitting: Family Medicine

## 2021-05-28 ENCOUNTER — Other Ambulatory Visit: Payer: Self-pay

## 2021-05-28 VITALS — Ht 68.0 in | Wt 174.0 lb

## 2021-05-28 DIAGNOSIS — G8929 Other chronic pain: Secondary | ICD-10-CM | POA: Diagnosis not present

## 2021-05-28 DIAGNOSIS — M25571 Pain in right ankle and joints of right foot: Secondary | ICD-10-CM | POA: Diagnosis not present

## 2021-05-28 DIAGNOSIS — M25512 Pain in left shoulder: Secondary | ICD-10-CM | POA: Diagnosis not present

## 2021-05-28 NOTE — Patient Instructions (Signed)
Thank you for coming to see me today. It was a pleasure. Today we talked about:   We will give you ankle strengthening exercises.  Wear supportive shoes when you will be on your feet.  Keep doing your shoulder exercises.  Please follow-up with [place] in 1 month.  If you have any questions or concerns, please do not hesitate to call the office at [phone].  Best,   Arizona Constable, DO [place]

## 2021-05-28 NOTE — Progress Notes (Signed)
   [name] is a 65 y.o. female who presents to [facility] today for the following:  Chronic left shoulder pain Last seen for the same on 8/26 by Dr. Nori Riis Had previously tried Mobic and lidocaine patches from her PCP She has a prior history of lung cancer and underwent 10 radiation treatments, but states that these have been completed for a few months She received a trigger point injection She was given range of motion exercises including scapular retraction and pendulum swings Also believed to have a neuropathic component from her radiation and has been on gabapentin, recommended discussing with PCP increasing this dose X-ray of her left shoulder was performed on 9/15, with small inferior humeral osteophyte noted Overall she is 90% better Doing exercises  Right Anterior Ankle Pain Chronic, 2 months Only when she has walked a long time, happens at end of the day Usually better by the next day No injury Worse when wearing sandals, better with tennis shoes No lateral pain, N/T  PMH reviewed.  ROS as above. Medications reviewed.  Exam:  Ht 5\' 8"  (1.727 m)   Wt 174 lb (78.9 kg)   BMI 26.46 kg/m  Gen: Well NAD MSK:  Left Shoulder:  Inspection reveals no obvious deformity, atrophy, or asymmetry b/l. No bruising. No swelling Palpation is normal with no TTP over [facility] joint or bicipital groove b/l. Full ROM in flexion, abduction, internal/external rotation b/l NV intact distally b/l Normal scapular function observed b/l Special Tests:  - Impingement: Neg Hawkins - Supraspinatous: Negative empty can - Infraspinatous/Teres Minor: 5/5 strength with ER - Subscapularis: 5/5 strength with IR - Biceps tendon: Negative Speeds - Labrum: Negative Obriens - AC Joint: Negative cross arm - No painful arc and no drop arm sign  Right Ankle: - Inspection: No obvious deformity, erythema, swelling, or ecchymosis, ulcers, calluses, blisters b/l.  Normal arch b/l - Palpation: No TTP at MT heads,  no TTP at base of 5th MT, no TTP over cuboid, no tenderness over navicular prominence, no TTP over lateral or medial malleolus.  No sign of peroneal tendon subluxation or TTP.  NO TTP over anterior ankle or tibialis anterior. - Strength: Normal strength with dorsiflexion, plantarflexion, inversion, and eversion of foot; flexion and extension of toes b/l - ROM: Full ROM b/l - Neuro/vasc: NV intact distally bilaterally - Special Tests: Negative anterior drawer, normal inversion test.     Assessment and Plan: 1) Chronic left shoulder pain Mild osteoarthritis on x-ray.  She is improving after trigger point injections and home exercise program.  We will have her continue home exercises, discussed that if her pain does significantly worsen, could consider a glenohumeral joint injection.  We will have her follow-up in 1 month.  Acute right ankle pain Her exam is overall reassuring.  Could consider tibialis anterior as a cause versus some mild osteoarthritis.  Advised her to use tennis shoes if she is going to be walking a lot.  We will also give her ankle strengthening exercises.  Follow-up in 1 month.   [place] Constable, D.O.  PGY-4 [place] [facility]  05/28/2021 11:32 AM  Addendum:  Patient seen in the office by fellow.  Her history, exam, plan of care were precepted with me.  Karlton Lemon MD [place]

## 2021-05-28 NOTE — Assessment & Plan Note (Signed)
Her exam is overall reassuring.  Could consider tibialis anterior as a cause versus some mild osteoarthritis.  Advised her to use tennis shoes if she is going to be walking a lot.  We will also give her ankle strengthening exercises.  Follow-up in 1 month.

## 2021-05-28 NOTE — Assessment & Plan Note (Signed)
Mild osteoarthritis on x-ray.  She is improving after trigger point injections and home exercise program.  We will have her continue home exercises, discussed that if her pain does significantly worsen, could consider a glenohumeral joint injection.  We will have her follow-up in 1 month.

## 2021-06-01 ENCOUNTER — Other Ambulatory Visit: Payer: Self-pay | Admitting: Family Medicine

## 2021-06-01 DIAGNOSIS — C3432 Malignant neoplasm of lower lobe, left bronchus or lung: Secondary | ICD-10-CM

## 2021-06-01 DIAGNOSIS — Z1231 Encounter for screening mammogram for malignant neoplasm of breast: Secondary | ICD-10-CM

## 2021-06-04 ENCOUNTER — Encounter: Payer: Self-pay | Admitting: Family Medicine

## 2021-06-04 ENCOUNTER — Other Ambulatory Visit (HOSPITAL_COMMUNITY): Payer: Self-pay

## 2021-06-04 ENCOUNTER — Ambulatory Visit (INDEPENDENT_AMBULATORY_CARE_PROVIDER_SITE_OTHER): Payer: Medicare Other | Admitting: Family Medicine

## 2021-06-04 VITALS — Ht 68.5 in | Wt 174.0 lb

## 2021-06-04 DIAGNOSIS — M25512 Pain in left shoulder: Secondary | ICD-10-CM

## 2021-06-04 DIAGNOSIS — M546 Pain in thoracic spine: Secondary | ICD-10-CM | POA: Insufficient documentation

## 2021-06-04 DIAGNOSIS — C3432 Malignant neoplasm of lower lobe, left bronchus or lung: Secondary | ICD-10-CM

## 2021-06-04 DIAGNOSIS — C679 Malignant neoplasm of bladder, unspecified: Secondary | ICD-10-CM

## 2021-06-04 MED ORDER — LIDOCAINE 5 % EX PTCH: 1.0000 | MEDICATED_PATCH | CUTANEOUS | 0 refills | Status: DC

## 2021-06-04 MED ORDER — TRAMADOL HCL 50 MG PO TABS
50.0000 mg | ORAL_TABLET | Freq: Four times a day (QID) | ORAL | 1 refills | Status: DC | PRN
  Filled 2021-06-04: qty 30, 7d supply, fill #0

## 2021-06-04 NOTE — Assessment & Plan Note (Signed)
Chart review reveals resection of bladder mass February, 2022.  Carcinoma in situ.  This is further reason to consider cancer recurrence.

## 2021-06-04 NOTE — Progress Notes (Signed)
  [name] - 65 y.o. female MRN [id number]  Date of birth: [birth year]    SUBJECTIVE:      Chief Complaint:/ HPI:  Left upper back pain.  At last office visit she was having some shoulder pain and we gave her an injection that seemed to help for a few days and then she developed very localized left upper back pain that is 10 out of 10.  Does not radiate.  Worse with certain activities such as rotation of the trunk, reaching forward, taking a deep breath.  PERTINENT  PMH / PSH: I have reviewed the patient's medications, allergies, past medical and surgical history, smoking status.  Pertinent findings that relate to today's visit / issues include:  History of lung cancer CT scan report April, 202 2:No acute findings within the chest. 2. Cystic and solid pleural base mass in the left lower lobe, mildly decreased in size compared to the prior CT. Stable predominantly cystic mass/lesion in the right lower lobe. No new lung masses or suspicious nodules. 3. Moderate emphysema.  Minor aortic atherosclerosis   OBJECTIVE: Ht 5' 8.5" (1.74 m)   Wt 174 lb (78.9 kg)   BMI 26.07 kg/m   Physical Exam:  Vital signs are reviewed. GENERAL: Well-developed female no acute distress BACK: Extremely tender to palpation over the left rhomboid area.  She also has pain here when she externally rotates the shoulder and pushes forward.  There is no trapezius spasm.  The shoulder itself on the left side is not painful with internal or external rotation.  She can abduct to 110 degrees with no pain, forward flex to 180 degrees no pain.  ASSESSMENT & PLAN:  See problem based charting & AVS for pt instructions. Left-sided thoracic back pain This does not seem to be consistent with her prior complaints of shoulder pain.  This seems much more localized in the back.  Given her cancer history, I am very concerned that she may have had recurrence.  She is already set up for CT of the chest on October 3.  She was unaware  of that appointment we reminded her and reiterated absolute need to get that done.  In the interim I will use lidocaine patches and tramadol for pain relief.  She will follow-up with me after the CT scan. I did consider zoster is not origin of her pains.  She has no rash.  She has had both of her shingles shots.  Bladder cancer [facility]) Chart review reveals resection of bladder mass February, 2022.  Carcinoma in situ.  This is further reason to consider cancer recurrence.

## 2021-06-04 NOTE — Assessment & Plan Note (Addendum)
This does not seem to be consistent with her prior complaints of shoulder pain.  This seems much more localized in the back.  Given her cancer history, I am very concerned that she may have had recurrence.  She is already set up for CT of the chest on October 3.  She was unaware of that appointment we reminded her and reiterated absolute need to get that done.  In the interim I will use lidocaine patches and tramadol for pain relief.  She will follow-up with me after the CT scan. I did consider zoster is not origin of her pains.  She has no rash.  She has had both of her shingles shots.

## 2021-06-07 ENCOUNTER — Telehealth: Payer: Self-pay | Admitting: Pulmonary Disease

## 2021-06-07 ENCOUNTER — Telehealth: Payer: Self-pay

## 2021-06-07 ENCOUNTER — Other Ambulatory Visit (HOSPITAL_COMMUNITY): Payer: Self-pay

## 2021-06-07 ENCOUNTER — Encounter: Payer: Self-pay | Admitting: Radiation Oncology

## 2021-06-07 ENCOUNTER — Other Ambulatory Visit: Payer: Self-pay

## 2021-06-07 DIAGNOSIS — M25512 Pain in left shoulder: Secondary | ICD-10-CM

## 2021-06-07 MED ORDER — LIDOCAINE 5 % EX PTCH
1.0000 | MEDICATED_PATCH | CUTANEOUS | 0 refills | Status: DC
  Filled 2021-06-07: qty 15, 15d supply, fill #0
  Filled 2021-06-18 – 2021-06-25 (×2): qty 30, 30d supply, fill #0

## 2021-06-07 NOTE — Progress Notes (Signed)
Pt requested patches be sent to a different pharmacy.

## 2021-06-07 NOTE — Telephone Encounter (Signed)
I called the patient and have transferred her to central scheduling to resche

## 2021-06-08 ENCOUNTER — Ambulatory Visit (INDEPENDENT_AMBULATORY_CARE_PROVIDER_SITE_OTHER): Payer: Medicare Other | Admitting: Family Medicine

## 2021-06-08 ENCOUNTER — Encounter: Payer: Self-pay | Admitting: Family Medicine

## 2021-06-08 ENCOUNTER — Other Ambulatory Visit: Payer: Self-pay

## 2021-06-08 DIAGNOSIS — M546 Pain in thoracic spine: Secondary | ICD-10-CM | POA: Diagnosis not present

## 2021-06-08 MED ORDER — BACLOFEN 10 MG PO TABS: 5.0000 mg | ORAL_TABLET | Freq: Three times a day (TID) | ORAL | 0 refills | Status: DC

## 2021-06-08 NOTE — Patient Instructions (Signed)
It was good seeing you today.  I am sorry you are still having issues with your pain.  I want you to be sure to get your CT scan and you can follow-up with [place] in 1 week.  For the pain I recommend continued use of the Tylenol arthritic pain but decrease the dose to 650 mg per dose.  You could go get 500 mg tablets and take at thousand but do not take more than 4 g/day.  I have sent a medication called baclofen which is a muscle relaxer to your pharmacy.  You can see if this will help.  If you have any questions or concerns please call the clinic if your symptoms get worse please let us know.  I hope you have a great afternoon!

## 2021-06-08 NOTE — Progress Notes (Addendum)
    SUBJECTIVE:   CHIEF COMPLAINT / HPI:   Left shoulder mass Patient presents to the clinic today complaining of left upper back pain.  She has been seen multiple times over the past few months for pain which initially started underneath her left breast, radiated to her left side and up to her left shoulder.  Then moved from her left shoulder to her back.  She did have an injection in her shoulder which gave some relief but then the pain returned and over the past 4 days she has had swelling.  She denies any fever, chills, erythema or drainage around the swelling in her left back.  The pain is worsened with movement, palpation, breathing.  She has been taking over-the-counter pain medications such as Tylenol and ibuprofen with little relief.  She has a Lidoderm patch on the lesion right now.  She was given Ultram for pain but took 1 dose and began itching so she has not taken the medication anymore.  OBJECTIVE:   BP (!) 110/92   Pulse (!) 117   Wt 187 lb (84.8 kg)   SpO2 94%   BMI 28.02 kg/m   General: No acute distress, sitting on exam table Back: Patient has 8 cm x 6 cm lesion in the upper part of her back on the left side.  It is nonfluctuant, nonerythematous but it is warm to touch.  She has a Lidoderm patch on it.  MSK: Patient has full range of motion of the upper extremities bilaterally with full strength and sensation. ASSESSMENT/PLAN:   Left-sided thoracic back pain Patient continues to have left-sided shoulder pain with a new mass noted in the location.  The mass is nonfluctuant and feels more like induration.  Warm to touch but nonerythematous with no drainage noted.  There was already concern for recurrence of patient's cancer so CT has been moved up to 9/29.  She will continue to use lidocaine patches as well as Ultram if she can tolerate it.  Sent in prescription for baclofen as well. On auscultation patient was no longer tachycardic. Feel initial tachycardia was related to pain  and frustration.  Strict ED and return precautions given.     Gifford Shave, MD [place]

## 2021-06-09 NOTE — Assessment & Plan Note (Addendum)
Patient continues to have left-sided shoulder pain with a new mass noted in the location.  The mass is nonfluctuant and feels more like induration.  Warm to touch but nonerythematous with no drainage noted.  There was already concern for recurrence of patient's cancer so CT has been moved up to 9/29.  She will continue to use lidocaine patches as well as Ultram if she can tolerate it.  Sent in prescription for baclofen as well. On auscultation patient was no longer tachycardic. Feel initial tachycardia was related to pain and frustration.  Strict ED and return precautions given.

## 2021-06-09 NOTE — Telephone Encounter (Signed)
[name] I looked through her med list and it is unclear to me how much gabapentin she is taking or if she is taking it at all.  She is taken the past so I know she can tolerate it so I would tell her if she is not taking it, please start 300 mg at night and increase to 600 mg at night after 4 days.  This would probably be the best option for pain relief.  If she is already taking it, let me know the dosing and we can go up from there.  Thanks Dorcas Mcmurray

## 2021-06-10 ENCOUNTER — Other Ambulatory Visit: Payer: Self-pay

## 2021-06-10 ENCOUNTER — Ambulatory Visit (HOSPITAL_COMMUNITY)
Admission: RE | Admit: 2021-06-10 | Discharge: 2021-06-10 | Disposition: A | Discharge status: Home / Self Care | Payer: Medicare Other | Source: Ambulatory Visit | Attending: Radiation Oncology | Admitting: Radiation Oncology

## 2021-06-10 DIAGNOSIS — J439 Emphysema, unspecified: Secondary | ICD-10-CM | POA: Diagnosis not present

## 2021-06-10 DIAGNOSIS — C3432 Malignant neoplasm of lower lobe, left bronchus or lung: Secondary | ICD-10-CM | POA: Insufficient documentation

## 2021-06-10 DIAGNOSIS — R911 Solitary pulmonary nodule: Secondary | ICD-10-CM | POA: Diagnosis not present

## 2021-06-10 DIAGNOSIS — C3490 Malignant neoplasm of unspecified part of unspecified bronchus or lung: Secondary | ICD-10-CM | POA: Diagnosis not present

## 2021-06-10 DIAGNOSIS — R918 Other nonspecific abnormal finding of lung field: Secondary | ICD-10-CM | POA: Diagnosis not present

## 2021-06-10 DIAGNOSIS — I700 Atherosclerosis of aorta: Secondary | ICD-10-CM | POA: Diagnosis not present

## 2021-06-10 MED ORDER — GABAPENTIN 300 MG PO CAPS: 300.0000 mg | ORAL_CAPSULE | Freq: Every day | ORAL | 1 refills | Status: DC

## 2021-06-14 ENCOUNTER — Ambulatory Visit (HOSPITAL_COMMUNITY): Payer: Medicare Other

## 2021-06-15 DIAGNOSIS — N130 Hydronephrosis with ureteropelvic junction obstruction: Secondary | ICD-10-CM | POA: Diagnosis not present

## 2021-06-15 DIAGNOSIS — C678 Malignant neoplasm of overlapping sites of bladder: Secondary | ICD-10-CM | POA: Diagnosis not present

## 2021-06-16 NOTE — Progress Notes (Addendum)
Radiation Oncology         [phone] ________________________________  Name: [name] MRN: [id number]  Date: 06/17/2021  DOB: [birth year]  Follow-Up Visit Note  CC: Laura Essex, MD  Laura Nash, DO    ICD-10-CM   1. Primary adenocarcinoma of lower lobe of left lung (HCC)  C34.32 CT CHEST WO CONTRAST    2. Malignant neoplasm of urinary bladder, unspecified site ([place])  C67.9     3. Acute left-sided thoracic back pain  M54.6 [place] CHEST SOFT TISSUE      Diagnosis: The encounter diagnosis was Primary adenocarcinoma of lower lobe of left lung ([place]).   Stage IIA (T2b, N0, M0) non-small cell lung cancer, adenocarcinoma of the left lower lobe with suspicious right lower lobe cystic lesion  Interval Since Last Radiation:  8 months and 2 days   Indication for treatment:  Curative        Radiation treatment dates:   1/25, 1/27, 1/31, 2/2, 10/16/20   Site/dose:      Beams/energy:   SBRT, VMAT / 6X-FFF   Narrative:  The patient returns today for routine follow-up (has not been seen by me since completion of radiation treatment this past February). Her interval history since then is as follows.  Since her last visit, (in regards to the patient's history of high-grade bladder cancer) the patient underwent transurethral resection of bladder tumor for restaging on 11/04/20. Biopsy collected  from the old resection site of the bladder revealed  noninvasive high grade urothelial carcinoma (CIS) involving Von Brunn nests.   The patient presented to [facility] ED on 12/17/20 with chest pain and SOB worsening over several weeks. EKG performed showed normal sinus rhythym. Angio chest CT performed was negative for PE, and demonstrated a mild decrease in size of the cystic and solid pleural base mass in the left lower lobe, and the stable appearance of the predominantly cystic mass/lesion in the right lower lobe. No new lung masses, suspicious nodules, or other acute findings were otherwise  seen. Chest x-ray also performed demonstrated improved lung volumes and ventilation since imaging in November 2021. Known reticulonodular lesion in the superior segment left lower lobe was also visualized. Patient was suspected to have mild COPD exacerbation and prescribed albuterol.  The patient soon after followed up with Dr. Valeta [name] on 01/20/21 regarding complaints of left-sided chest pain. The patient described her left sided chest pain as predominantly associated with big deep breaths and moving around, and localized just underneath her left bra line and underneath her left breast. Dr. Valeta [name] noted the patients left sided chest pain as likely musculoskeletal in nature due to palpable tenderness along the area of reported pain.  Pertinent imaging since the patient was last seen includes the following:  --DXA for bone density performed on 01/12/21 revealed the patient to have low bone mass/ osteopenia by WHO criteria.  --X-ray of cervical spine on 04/21/21 ( performed for reports of shoulder and neck pain) demonstrated the stable appearance of ACDF of C3 through C6. --X-ray of left shoulder on 05/27/21 (performed for reports of left shoulder pain) demonstrated mild degenerative changes of the glenohumeral and acromioclavicular joints.  --Restaging Chest CT on 06/10/21 demonstrated the interval decrease in size of the posterior left lower lobe pulmonary lesion; noted to display a more consolidative appearance. Additionally, the irregular cystic lesion in the posterior right lower lobe, and the scattered tiny bilateral pulmonary nodules, were both seen to be similar/stable in appearance since prior imaging.  sHe denies any significant breathing issues significant cough or hemoptysis.  She has been bothered by a swelling in the left upper back region.  Reports this is been present for approximately the 3 weeks.  She denies any chills or fever    Allergies:  is allergic to codeine and  prednisone.  Meds: Current Outpatient Medications  Medication Sig Dispense Refill   acetaminophen (TYLENOL) 650 MG CR tablet Take 1,300 mg by mouth every 8 (eight) hours as needed for pain.     albuterol (VENTOLIN HFA) 108 (90 Base) MCG/ACT inhaler USE 2 INHALATIONS BY MOUTH  INTO THE LUNGS EVERY 6  HOURS AS NEEDED FOR  SHORTNESS OF BREATH OR  WHEEZING 13.4 g 6   Ascorbic Acid (VITAMIN C ADULT GUMMIES PO) Take 1 tablet by mouth daily.     atorvastatin (LIPITOR) 40 MG tablet Take 1 tablet (40 mg total) by mouth daily. (Patient taking differently: Take 40 mg by mouth at bedtime.) 90 tablet 3   azelastine (OPTIVAR) 0.05 % ophthalmic solution Place 1 drop into both eyes 2 (two) times daily as needed for allergies.     baclofen (LIORESAL) 10 MG tablet Take 0.5 tablets (5 mg total) by mouth 3 (three) times daily. 30 each 0   carbamide peroxide (DEBROX) 6.5 % OTIC solution Place 5 drops into both ears 2 (two) times daily. 15 mL 0   cetirizine (ZYRTEC) 10 MG tablet Take 1 tablet (10 mg total) by mouth daily. 30 tablet 6   chlorhexidine (PERIDEX) 0.12 % solution SMARTSIG:0.5 Ounce(s) By Mouth Twice Daily     clobetasol cream (TEMOVATE) 4.25 % Apply 1 application topically 2 (two) times daily as needed. (Patient taking differently: Apply 1 application topically 2 (two) times daily as needed (psoriasis).) 30 g 2   diclofenac Sodium (VOLTAREN) 1 % GEL Apply 2 g topically 4 (four) times daily. Apply to painful area of ribs. Need to use regularly for relief. 100 g 2   fluticasone (FLONASE) 50 MCG/ACT nasal spray Place 2 sprays into both nostrils daily. 16 g 6   gabapentin (NEURONTIN) 300 MG capsule Take 1 capsule (300 mg total) by mouth at bedtime. After 4 days increase to 2 capsules (600 mg total) by mouth at bedtime. 60 capsule 1   hydrOXYzine (ATARAX/VISTARIL) 50 MG tablet Take 1 tablet (50 mg total) by mouth at bedtime as needed for anxiety or itching (sleep). 30 tablet 1   Melatonin 10 MG TBCR Take 10 mg  by mouth at bedtime. 90 tablet 2   pantoprazole (PROTONIX) 40 MG tablet Take 1 tablet (40 mg total) by mouth daily. 30 tablet 3   polyethylene glycol powder (GLYCOLAX/MIRALAX) 17 GM/SCOOP powder Take 17 g by mouth daily. 3350 g 1   traMADol (ULTRAM) 50 MG tablet Take 1 tablet (50 mg total) by mouth every 6 (six) hours as needed for moderate pain or severe pain. 30 tablet 1   venlafaxine XR (EFFEXOR-XR) 75 MG 24 hr capsule TAKE 1 CAPSULE(75 MG) BY MOUTH DAILY WITH BREAKFAST 90 capsule 0   lidocaine (LIDODERM) 5 % Place 1 patch onto the skin daily. Remove & Discard patch within 12 hours or as directed by MD (Patient not taking: Reported on 06/17/2021) 30 patch 0   No current facility-administered medications for this encounter.    Physical Findings: The patient is in no acute distress. Patient is alert and oriented.  height is 5\' 8"  (1.727 m) and weight is 187 lb 3.2 oz (84.9 kg). Her temperature is  97 F (36.1 C) (abnormal). Her blood pressure is 160/76 (abnormal) and her pulse is 81. Her respiration is 18 and oxygen saturation is 100%. .  No significant changes. Lungs are clear to auscultation bilaterally. Heart has regular rate and rhythm. No palpable cervical, supraclavicular, or axillary adenopathy. Abdomen soft, non-tender, normal bowel sounds.  Examination of the upper back area reveals a approximately 5 x 5 cm firm mass which is tender with palpation.  This is located in the inferior border of the left scapula.  This is uncomfortable with palpation to the patient.  This area feels quite firm suggestive of a soft tissue mass.  There is no erythema to the mass to suggest infection.   Lab Findings: Lab Results  Component Value Date   WBC 7.6 12/17/2020   HGB 12.2 12/17/2020   HCT 38.7 12/17/2020   MCV 88.8 12/17/2020   PLT 337 12/17/2020    Radiographic Findings: CT CHEST WO CONTRAST  Result Date: 06/12/2021 CLINICAL DATA:  Non-small-cell lung cancer.  Restaging. EXAM: CT CHEST WITHOUT  CONTRAST TECHNIQUE: Multidetector CT imaging of the chest was performed following the standard protocol without IV contrast. COMPARISON:  12/17/2020 CTA Chest FINDINGS: Cardiovascular: The heart size is normal. No substantial pericardial effusion. Mild atherosclerotic calcification is noted in the wall of the thoracic aorta. Aberrant right subclavian artery again noted. Mediastinum/Nodes: No mediastinal lymphadenopathy. No evidence for gross hilar lymphadenopathy although assessment is limited by the lack of intravenous contrast on the current study. The esophagus has normal imaging features. There is no axillary lymphadenopathy. Lungs/Pleura: Centrilobular and paraseptal emphysema evident. Scattered areas of architectural distortion and scarring noted bilaterally adjacent 4-5 mm pulmonary nodules in the right lung on images 82 and 83 are stable in the interval. Thin walled irregular cystic lesion in the posterior right lower lobe on 96/5 measures 7.5 x 4.0 cm today compared to 6.1 x 3.4 cm previously. Posterior left lower lobe lesion measured previously at 4.4 x 2.2 cm now measures 4.2 x 1.4 cm and is more consolidative in appearance. Slight increase in volume loss noted left lower lobe. No focal airspace consolidation.  No pleural effusion. Upper Abdomen: Exophytic cyst upper pole right kidney is similar to prior. Mild fullness noted upper pole collecting system of the right kidney. Patient had a ureteral stent in situ on CT stone study from 09/19/2020. Musculoskeletal: No worrisome lytic or sclerotic osseous abnormality. IMPRESSION: 1. Interval decrease in size of the posterior left lower lobe pulmonary lesion with more consolidative appearance today. 2. Similar appearance irregular cystic lesion in the posterior right lower lobe. 3. Stable scattered tiny bilateral pulmonary nodules. Attention on follow-up recommended. 4. Aortic Atherosclerosis (ICD10-I70.0) and Emphysema (ICD10-J43.9). Electronically Signed    By: Misty Stanley M.D.   On: 06/12/2021 07:29   DG Shoulder Left  Result Date: 05/28/2021 CLINICAL DATA:  Left shoulder pain EXAM: LEFT SHOULDER - 2+ VIEW COMPARISON:  Contralateral shoulder radiographs 05/12/2006 FINDINGS: There is no acute fracture or dislocation. Glenohumeral and acromioclavicular alignment is normal. There is mild glenohumeral joint space narrowing with associated osteophytosis. There is mild degenerative change at the [facility] joint. The soft tissues are unremarkable. IMPRESSION: Mild degenerative changes of the glenohumeral and acromioclavicular joints. Electronically Signed   By: Valetta Mole M.D.   On: 05/28/2021 10:34   [place] CHEST SOFT TISSUE  Result Date: 06/17/2021 CLINICAL DATA:  Palpable lump in middle of left back EXAM: ULTRASOUND OF CHEST SOFT TISSUES TECHNIQUE: Ultrasound examination of the chest wall soft tissues  was performed in the area of clinical concern. COMPARISON:  None. FINDINGS: Corresponding to the area of concern is a 3.4 x 2.8 x 4.0 cm hypoechoic mass within the subcutaneous soft tissues the left mid back. No significant increased vascularity. IMPRESSION: Left posterior chest wall subcutaneous mass appears hypoechoic and well-circumscribed without increased vascularity. This is indeterminate. Differential considerations include epidermal inclusion cyst, hematoma or abscess. If this persists or continues to progress recommend more definitive characterization with contrast enhanced MRI or CT of the chest. Electronically Signed   By: Kerby Moors M.D.   On: 06/17/2021 12:53    Impression:  The encounter diagnosis was Primary adenocarcinoma of lower lobe of left lung ([place]).   Stage IIA (T2b, N0, M0) non-small cell lung cancer, adenocarcinoma of the left lower lobe with suspicious right lower lobe cystic lesion  Recent chest CT scan shows shrinkage of the left lower lobe pulmonary nodule that was treated with SBRT.  Patient has a cystic area in the right lower lobe  which has been stable.  Chest CT scan the palpable mass in the left upper back area did not image however ultrasound ordered today confirmed a hypoechoic mass measuring 4 cm in greatest dimension.  This is causing pain for the patient.  She has had a favorable response to her SBRT treatments.  Would expect further shrinkage of the area of concern,  will repeat chest CT scan in 6 months.  The hypoechoic mass in the left upper back area is indeterminate.  Radiology recommends MRI of the chest if this persist for further characterization.  Plan: .  The patient will return for follow-up in 3 to 4 weeks,  if she continues to have pain or persistent mass in this area and we will order MRI as recommended by radiology.  Otherwise the patient will return in 6 months for follow-up of her lung cancer with CT scan of the chest.       25 minutes of total time was spent for this patient encounter, including preparation, face-to-face counseling with the patient and coordination of care, physical exam, and documentation of the encounter. ____________________________________  Blair Promise, PhD, MD   This document serves as a record of services personally performed by Gery Pray, MD. It was created on his behalf by Roney Mans, a trained medical scribe. The creation of this record is based on the scribe's personal observations and the provider's statements to them. This document has been checked and approved by the attending provider.

## 2021-06-17 ENCOUNTER — Ambulatory Visit (HOSPITAL_COMMUNITY)
Admission: RE | Admit: 2021-06-17 | Discharge: 2021-06-17 | Disposition: A | Discharge status: Home / Self Care | Payer: Medicare Other | Source: Ambulatory Visit | Attending: Radiation Oncology | Admitting: Radiation Oncology

## 2021-06-17 ENCOUNTER — Encounter: Payer: Self-pay | Admitting: Radiation Oncology

## 2021-06-17 ENCOUNTER — Other Ambulatory Visit: Payer: Self-pay

## 2021-06-17 ENCOUNTER — Ambulatory Visit
Admission: RE | Admit: 2021-06-17 | Discharge: 2021-06-17 | Disposition: A | Discharge status: Home / Self Care | Payer: Medicare Other | Source: Ambulatory Visit | Attending: Radiation Oncology | Admitting: Radiation Oncology

## 2021-06-17 VITALS — BP 160/76 | HR 81 | Temp 97.0°F | Resp 18 | Ht 68.0 in | Wt 187.2 lb

## 2021-06-17 DIAGNOSIS — C679 Malignant neoplasm of bladder, unspecified: Secondary | ICD-10-CM

## 2021-06-17 DIAGNOSIS — C3432 Malignant neoplasm of lower lobe, left bronchus or lung: Secondary | ICD-10-CM | POA: Insufficient documentation

## 2021-06-17 DIAGNOSIS — M546 Pain in thoracic spine: Secondary | ICD-10-CM

## 2021-06-17 DIAGNOSIS — Z923 Personal history of irradiation: Secondary | ICD-10-CM | POA: Diagnosis not present

## 2021-06-17 DIAGNOSIS — R222 Localized swelling, mass and lump, trunk: Secondary | ICD-10-CM | POA: Diagnosis not present

## 2021-06-17 DIAGNOSIS — Z79899 Other long term (current) drug therapy: Secondary | ICD-10-CM | POA: Diagnosis not present

## 2021-06-17 DIAGNOSIS — Z08 Encounter for follow-up examination after completed treatment for malignant neoplasm: Secondary | ICD-10-CM | POA: Diagnosis not present

## 2021-06-17 HISTORY — DX: Personal history of irradiation: Z92.3

## 2021-06-17 NOTE — Progress Notes (Addendum)
[name] is here today for follow up post radiation to the lung.  Lung Side: left  Completed radiation on 10/16/2020  Does the patient complain of any of the following: Pain:denies Shortness of breath w/wo exertion: with exertion Cough: intermittent mild cough Hemoptysis: did previously but not currently Pain with swallowing: denies Swallowing/choking concerns: denies Appetite: good Energy Level: intermittent fatigue Post radiation skin Changes: denies    Additional comments if applicable: stopped using lidocaine patch due to not relieving pain, knot on left shoulder, left breast and shoulder pain, urinary leakage at night, nocturia x 2-3  Vitals:   06/17/21 0948  BP: (!) 160/76  Pulse: 81  Resp: 18  Temp: (!) 97 F (36.1 C)  SpO2: 100%  Weight: 187 lb 3.2 oz (84.9 kg)  Height: 5\' 8"  (1.727 m)

## 2021-06-18 ENCOUNTER — Ambulatory Visit (INDEPENDENT_AMBULATORY_CARE_PROVIDER_SITE_OTHER): Payer: Medicare Other | Admitting: Family Medicine

## 2021-06-18 ENCOUNTER — Telehealth: Payer: Self-pay | Admitting: *Deleted

## 2021-06-18 ENCOUNTER — Other Ambulatory Visit (HOSPITAL_COMMUNITY): Payer: Self-pay

## 2021-06-18 ENCOUNTER — Telehealth: Payer: Self-pay | Admitting: Family Medicine

## 2021-06-18 ENCOUNTER — Other Ambulatory Visit: Payer: Self-pay

## 2021-06-18 VITALS — Ht 68.0 in | Wt 178.0 lb

## 2021-06-18 DIAGNOSIS — R229 Localized swelling, mass and lump, unspecified: Secondary | ICD-10-CM

## 2021-06-18 DIAGNOSIS — Z23 Encounter for immunization: Secondary | ICD-10-CM

## 2021-06-18 DIAGNOSIS — Z0000 Encounter for general adult medical examination without abnormal findings: Secondary | ICD-10-CM | POA: Diagnosis not present

## 2021-06-18 NOTE — Progress Notes (Signed)
  [name] - 65 y.o. female MRN [id number]  Date of birth: Dec 20, 1955    SUBJECTIVE:      Chief Complaint:/ HPI:  Follow-up left back pain.  At office visit on 8/26   we did trigger point injections in the area of the medial scapular border.  She had some improvement in pain after that.  She was seen again in family medicine clinic on September 27 and noted to have recurrence of her upper left back..  On that date, the exam revealed a tender mass about 4 cm in diameter.  Mass was described as an area of induration.  CT scan was performed on September 27 for restaging of her non-small cell lung cancer.  That imaging study did not reveal any changes in the area of the skin induration.  She was then seen at her oncologist office on the 27th, and an ultrasound was performed.  Per the notes , office ultrasound was performed that showed A 4 cm collection of fluid.  Notably the ultrasound is labeled as performed on the chest wall but looking at the notes and the images, this is obviously performed in the left upper back. She wants our evaluation of the fluid collection in her posterior back today.  She has follow-up in 2 weeks with her oncologist regarding this.  She says it is quite tender.  She denies any falls.  This showed up several weeks ago but was more than a week after her injection for trigger point.  She has had no fever, no chills.   OBJECTIVE: Ht 5\' 8"  (1.727 m)   Wt 178 lb (80.7 kg)   BMI 27.06 kg/m   Physical Exam:  Vital signs are reviewed. GENERAL: Well-developed female no acute distress BACK: Fairly well-defined fluid collection that is tender to palpation approximately 4 cm in diameter located proximal to the bra strap and medial to the bra shoulder strap.  No erythema.  No surrounding induration.  ASSESSMENT & PLAN:  See problem based charting & AVS for pt instructions. No problem-specific Assessment & Plan notes found for this encounter.  #1.  Soft tissue swelling or fluid  collection in the left mid back.  I have reviewed the timeline and I do not think this is related to her trigger point injections as it occurred temporally outside the timeline that I would think would be typical for that.  I wonder if she has had a fall or some type of mild traumatic injury, potentially something where she ran into a door or wall and was not even aware of it.  She already has follow-up set with her oncologist and I think he is the most appropriate person to follow this.  We would be happy to see her back anytime.  I am pleased that the initial problem with the muscle spasm around the upper part and medial part of her left scapula has resolved.

## 2021-06-18 NOTE — Progress Notes (Signed)
    SUBJECTIVE:   CHIEF COMPLAINT / HPI:   [name] was scheduled for CT scan follow up and an influenza vaccine. She arrived at her appointment and reported she saw sports medicine this morning to go over the CT scan regarding the mass on her posterior shoulder. She had all her questions answered there. She now requests to have her flu vaccine only. No other complaints she would like to address today.   PERTINENT  PMH / PSH: primary adenocarcinoma of LLL (in treatment), pulm emphysema, hot flashes, bladder cancer, osteopenia, HLD  OBJECTIVE:   There were no vitals taken for this visit.   PHQ-9:  Depression screen [facility] 2/9 06/08/2021 04/26/2021 12/31/2020  Decreased Interest 1 0 0  Down, Depressed, Hopeless 1 0 0  PHQ - 2 Score 2 0 0  Altered sleeping 2 2 0  Tired, decreased energy 2 - 0  Change in appetite 1 0 0  Feeling bad or failure about yourself  1 0 0  Trouble concentrating 1 0 0  Moving slowly or fidgety/restless 1 0 0  Suicidal thoughts 0 0 0  PHQ-9 Score 10 2 0  Difficult doing work/chores Not difficult at all - -  Some recent data might be hidden     GAD-7:  GAD 7 : Generalized Anxiety Score 02/19/2020 12/11/2015  Nervous, Anxious, on Edge 0 0  Control/stop worrying 1 2  Worry too much - different things 0 2  Trouble relaxing 1 2  Restless 0 0  Easily annoyed or irritable 0 0  Afraid - awful might happen 0 0  Total GAD 7 Score 2 6  Anxiety Difficulty Not difficult at all -     Physical Exam General: Awake, alert, oriented Cardiovascular: Regular rate and rhythm, S1 and S2 present, no murmurs auscultated Respiratory: Lung fields clear to auscultation bilaterally  ASSESSMENT/PLAN:   Healthcare maintenance Received influenza vaccine today. Tolerated well without any adverse side effects. Otherwise, completely up to date on health maintenance.     [name], MD [place]

## 2021-06-18 NOTE — Telephone Encounter (Signed)
CALLED PATIENT TO ASK ABOUT COMING IN FOR A FU ON 07-08-21 @ 10 AM, PATIENT AGREED TO DO SO.

## 2021-06-18 NOTE — Telephone Encounter (Signed)
Attempted to call patient regarding

## 2021-06-18 NOTE — Patient Instructions (Signed)
It was wonderful to see you today. Thank you for allowing me to be a part of your care. Below is a short summary of what we discussed at your visit today:  Vaccines Today you received the annual flu vaccine. You may experience some residual soreness at the injection site.  Gentle stretches and regular use of that arm will help speed up your recovery.  As the vaccines are giving your immune system a "practice run" against specific infections, you may feel a little under the weather for the next several days.  We recommend rest as needed and hydrating.  COVID booster We have scheduled you for your bivalent (new and improved two-strain version) COVID booster for Mon 10/24 after your mammogram.     Please bring all of your medications to every appointment!  If you have any questions or concerns, please do not hesitate to contact us via phone or MyChart message.   [name], MD

## 2021-06-21 NOTE — Assessment & Plan Note (Addendum)
Received influenza vaccine today. Tolerated well without any adverse side effects. Otherwise, completely up to date on health maintenance.

## 2021-06-25 ENCOUNTER — Other Ambulatory Visit (HOSPITAL_COMMUNITY): Payer: Self-pay

## 2021-06-25 NOTE — Telephone Encounter (Signed)
Error. Please disregard

## 2021-07-05 ENCOUNTER — Ambulatory Visit: Payer: Medicare Other

## 2021-07-05 ENCOUNTER — Ambulatory Visit
Admission: RE | Admit: 2021-07-05 | Discharge: 2021-07-05 | Disposition: A | Discharge status: Home / Self Care | Payer: Medicare Other | Source: Ambulatory Visit | Attending: Family Medicine | Admitting: Family Medicine

## 2021-07-05 ENCOUNTER — Other Ambulatory Visit (HOSPITAL_COMMUNITY): Payer: Self-pay

## 2021-07-05 DIAGNOSIS — Z1231 Encounter for screening mammogram for malignant neoplasm of breast: Secondary | ICD-10-CM

## 2021-07-07 NOTE — Progress Notes (Signed)
Radiation Oncology         [phone] ________________________________  Name: [name] MRN: [id number]  Date: 07/08/2021  DOB: 1955/12/09  Follow-Up Visit Note  CC: [name], MD  [name], DO    ICD-10-CM   1. Primary adenocarcinoma of lower lobe of left lung (HCC)  C34.32     2. Malignant neoplasm of urinary bladder, unspecified site [facility])  C67.9       Diagnosis: The encounter diagnosis was Primary adenocarcinoma of lower lobe of left lung ([place]).   Stage IIA (T2b, N0, M0) non-small cell lung cancer, adenocarcinoma of the left lower lobe with suspicious right lower lobe cystic lesion  Interval Since Last Radiation: 8 months, and 22 days   Indication for treatment:  Curative        Radiation treatment dates:   1/25, 1/27, 1/31, 2/2, 10/16/20   Site/dose:      Beams/energy:   SBRT, VMAT / 6X-FFF  Narrative:  The patient returns today for routine follow-up, she was last seen here for follow-up on 06/17/21. To review, the patient I discussed her returning for follow up in 3 to 4 weeks if she continues to have pain or persistent mass in this area. (Detailed in [place] below).  Ultrasound of the chest on 06/17/21 demonstrated the left posterior chest wall subcutaneous mass to appear hypoechoic and well-circumscribed without increased vascularity, measuring 3.4 x 2.8 x 4.0 cm. Differential consideration noted to include epidermal inclusion cyst, hematoma or abscess.      She reports ongoing pain in this area and would like to have it surgically removed if possible.    She also reports pain underneath her left breast which is been ongoing for several months.  Of note: bilateral screening mammogram on 06/25/21 demonstrated no suspicious areas in either breast.                 Allergies:  is allergic to codeine and prednisone.  Meds: Current Outpatient Medications  Medication Sig Dispense Refill   acetaminophen (TYLENOL) 650 MG CR tablet Take 1,300 mg by mouth  every 8 (eight) hours as needed for pain.     albuterol (VENTOLIN HFA) 108 (90 Base) MCG/ACT inhaler USE 2 INHALATIONS BY MOUTH  INTO THE LUNGS EVERY 6  HOURS AS NEEDED FOR  SHORTNESS OF BREATH OR  WHEEZING 13.4 g 6   amoxicillin (AMOXIL) 500 MG capsule Take 500 mg by mouth 3 (three) times daily.     Ascorbic Acid (VITAMIN C ADULT GUMMIES PO) Take 1 tablet by mouth daily.     atorvastatin (LIPITOR) 40 MG tablet Take 1 tablet (40 mg total) by mouth daily. (Patient taking differently: Take 40 mg by mouth at bedtime.) 90 tablet 3   azelastine (OPTIVAR) 0.05 % ophthalmic solution Place 1 drop into both eyes 2 (two) times daily as needed for allergies.     baclofen (LIORESAL) 10 MG tablet Take 0.5 tablets (5 mg total) by mouth 3 (three) times daily. 30 each 0   carbamide peroxide (DEBROX) 6.5 % OTIC solution Place 5 drops into both ears 2 (two) times daily. 15 mL 0   cetirizine (ZYRTEC) 10 MG tablet Take 1 tablet (10 mg total) by mouth daily. 30 tablet 6   chlorhexidine (PERIDEX) 0.12 % solution SMARTSIG:0.5 Ounce(s) By Mouth Twice Daily     clobetasol cream (TEMOVATE) 3.97 % Apply 1 application topically 2 (two) times daily as needed. (Patient taking differently: Apply 1 application topically 2 (two) times  daily as needed (psoriasis).) 30 g 2   diclofenac Sodium (VOLTAREN) 1 % GEL Apply 2 g topically 4 (four) times daily. Apply to painful area of ribs. Need to use regularly for relief. 100 g 2   fluticasone (FLONASE) 50 MCG/ACT nasal spray Place 2 sprays into both nostrils daily. 16 g 6   gabapentin (NEURONTIN) 300 MG capsule Take 1 capsule (300 mg total) by mouth at bedtime. After 4 days increase to 2 capsules (600 mg total) by mouth at bedtime. 60 capsule 1   hydrOXYzine (ATARAX/VISTARIL) 50 MG tablet Take 1 tablet (50 mg total) by mouth at bedtime as needed for anxiety or itching (sleep). 30 tablet 1   Melatonin 10 MG TBCR Take 10 mg by mouth at bedtime. 90 tablet 2   pantoprazole (PROTONIX) 40 MG  tablet Take 1 tablet (40 mg total) by mouth daily. 30 tablet 3   polyethylene glycol powder (GLYCOLAX/MIRALAX) 17 GM/SCOOP powder Take 17 g by mouth daily. 3350 g 1   venlafaxine XR (EFFEXOR-XR) 75 MG 24 hr capsule TAKE 1 CAPSULE(75 MG) BY MOUTH DAILY WITH BREAKFAST 90 capsule 0   lidocaine (LIDODERM) 5 % Place 1 patch onto the skin daily. Remove & Discard patch within 12 hours or as directed by MD (Patient not taking: No sig reported) 30 patch 0   traMADol (ULTRAM) 50 MG tablet Take 1 tablet (50 mg total) by mouth every 6 (six) hours as needed for moderate pain or severe pain. (Patient not taking: Reported on 07/08/2021) 30 tablet 1   No current facility-administered medications for this encounter.    Physical Findings: The patient is in no acute distress. Patient is alert and oriented.  height is 5\' 8"  (1.727 m) and weight is 190 lb (86.2 kg). Her temperature is 97.8 F (36.6 C). Her blood pressure is 127/93 (abnormal) and her pulse is 81. Her respiration is 20 and oxygen saturation is 100%. .   Lungs are clear to auscultation bilaterally. Heart has regular rate and rhythm. No palpable cervical, supraclavicular, or axillary adenopathy. Abdomen soft, non-tender, normal bowel sounds.  Examination of the left breast reveals no palpable mass nipple discharge or bleeding.  No palpable abnormality in the inframammary fold or point tenderness with palpation to this area.  Examination of the right breast reveals no palpable mass nipple discharge or bleeding.  She continues to have a mass in the left upper back region which seems to be softer on my exam today.  Estimated size is approximately 4 x 3-1/2 cm.  No signs of infection in this area.  No drainage from this area.   Lab Findings: Lab Results  Component Value Date   WBC 7.6 12/17/2020   HGB 12.2 12/17/2020   HCT 38.7 12/17/2020   MCV 88.8 12/17/2020   PLT 337 12/17/2020    Radiographic Findings: CT CHEST WO CONTRAST  Result Date:  06/12/2021 CLINICAL DATA:  Non-small-cell lung cancer.  Restaging. EXAM: CT CHEST WITHOUT CONTRAST TECHNIQUE: Multidetector CT imaging of the chest was performed following the standard protocol without IV contrast. COMPARISON:  12/17/2020 CTA Chest FINDINGS: Cardiovascular: The heart size is normal. No substantial pericardial effusion. Mild atherosclerotic calcification is noted in the wall of the thoracic aorta. Aberrant right subclavian artery again noted. Mediastinum/Nodes: No mediastinal lymphadenopathy. No evidence for gross hilar lymphadenopathy although assessment is limited by the lack of intravenous contrast on the current study. The esophagus has normal imaging features. There is no axillary lymphadenopathy. Lungs/Pleura: Centrilobular and paraseptal emphysema evident.  Scattered areas of architectural distortion and scarring noted bilaterally adjacent 4-5 mm pulmonary nodules in the right lung on images 82 and 83 are stable in the interval. Thin walled irregular cystic lesion in the posterior right lower lobe on 96/5 measures 7.5 x 4.0 cm today compared to 6.1 x 3.4 cm previously. Posterior left lower lobe lesion measured previously at 4.4 x 2.2 cm now measures 4.2 x 1.4 cm and is more consolidative in appearance. Slight increase in volume loss noted left lower lobe. No focal airspace consolidation.  No pleural effusion. Upper Abdomen: Exophytic cyst upper pole right kidney is similar to prior. Mild fullness noted upper pole collecting system of the right kidney. Patient had a ureteral stent in situ on CT stone study from 09/19/2020. Musculoskeletal: No worrisome lytic or sclerotic osseous abnormality. IMPRESSION: 1. Interval decrease in size of the posterior left lower lobe pulmonary lesion with more consolidative appearance today. 2. Similar appearance irregular cystic lesion in the posterior right lower lobe. 3. Stable scattered tiny bilateral pulmonary nodules. Attention on follow-up recommended. 4.  Aortic Atherosclerosis (ICD10-I70.0) and Emphysema (ICD10-J43.9). Electronically Signed   By: Misty Stanley M.D.   On: 06/12/2021 07:29   MM 3D SCREEN BREAST BILATERAL  Result Date: 07/08/2021 CLINICAL DATA:  Screening. EXAM: DIGITAL SCREENING BILATERAL MAMMOGRAM WITH TOMOSYNTHESIS AND CAD TECHNIQUE: Bilateral screening digital craniocaudal and mediolateral oblique mammograms were obtained. Bilateral screening digital breast tomosynthesis was performed. The images were evaluated with computer-aided detection. COMPARISON:  Previous exam(s). ACR Breast Density Category b: There are scattered areas of fibroglandular density. FINDINGS: There are no findings suspicious for malignancy. IMPRESSION: No mammographic evidence of malignancy. A result letter of this screening mammogram will be mailed directly to the patient. RECOMMENDATION: Screening mammogram in one year. (Code:[id number]-B-01Y) BI-RADS CATEGORY  1: Negative. Electronically Signed   By: Abelardo Diesel M.D.   On: 07/08/2021 08:39   [place] CHEST SOFT TISSUE  Result Date: 06/17/2021 CLINICAL DATA:  Palpable lump in middle of left back EXAM: ULTRASOUND OF CHEST SOFT TISSUES TECHNIQUE: Ultrasound examination of the chest wall soft tissues was performed in the area of clinical concern. COMPARISON:  None. FINDINGS: Corresponding to the area of concern is a 3.4 x 2.8 x 4.0 cm hypoechoic mass within the subcutaneous soft tissues the left mid back. No significant increased vascularity. IMPRESSION: Left posterior chest wall subcutaneous mass appears hypoechoic and well-circumscribed without increased vascularity. This is indeterminate. Differential considerations include epidermal inclusion cyst, hematoma or abscess. If this persists or continues to progress recommend more definitive characterization with contrast enhanced MRI or CT of the chest. Electronically Signed   By: Kerby Moors M.D.   On: 06/17/2021 12:53    Impression:  The encounter diagnosis was Primary  adenocarcinoma of lower lobe of left lung ([place]).   Stage IIA (T2b, N0, M0) non-small cell lung cancer, adenocarcinoma of the left lower lobe with suspicious right lower lobe cystic lesion  The patient has had a favorable response to her SBRT concerning her left lower lobe pulmonary lesion.    As above the patient has cystic mass in the left upper back area which is hypoechoic on ultrasound.  This is bothersome to the patient and she would like to have it removed if possible.  Plan: The patient will return in April after her upcoming chest CT scan.  She will also follow-up with [name] next month.  Referral to general surgery concerning her cystic lesion in the left upper back region.   20 minutes  of total time was spent for this patient encounter, including preparation, face-to-face counseling with the patient and coordination of care, physical exam, and documentation of the encounter. ____________________________________  Blair Promise, PhD, MD   This document serves as a record of services personally performed by Gery Pray, MD. It was created on his behalf by Roney Mans, a trained medical scribe. The creation of this record is based on the scribe's personal observations and the provider's statements to them. This document has been checked and approved by the attending provider.

## 2021-07-08 ENCOUNTER — Ambulatory Visit
Admission: RE | Admit: 2021-07-08 | Discharge: 2021-07-08 | Disposition: A | Discharge status: Home / Self Care | Payer: Medicare Other | Source: Ambulatory Visit | Attending: Radiation Oncology | Admitting: Radiation Oncology

## 2021-07-08 ENCOUNTER — Other Ambulatory Visit: Payer: Self-pay

## 2021-07-08 ENCOUNTER — Encounter: Payer: Self-pay | Admitting: Radiation Oncology

## 2021-07-08 VITALS — BP 127/93 | HR 81 | Temp 97.8°F | Resp 20 | Ht 68.0 in | Wt 190.0 lb

## 2021-07-08 DIAGNOSIS — J439 Emphysema, unspecified: Secondary | ICD-10-CM | POA: Diagnosis not present

## 2021-07-08 DIAGNOSIS — Z08 Encounter for follow-up examination after completed treatment for malignant neoplasm: Secondary | ICD-10-CM | POA: Diagnosis not present

## 2021-07-08 DIAGNOSIS — C3432 Malignant neoplasm of lower lobe, left bronchus or lung: Secondary | ICD-10-CM | POA: Diagnosis not present

## 2021-07-08 DIAGNOSIS — Z79899 Other long term (current) drug therapy: Secondary | ICD-10-CM | POA: Insufficient documentation

## 2021-07-08 DIAGNOSIS — Z923 Personal history of irradiation: Secondary | ICD-10-CM | POA: Diagnosis not present

## 2021-07-08 DIAGNOSIS — I700 Atherosclerosis of aorta: Secondary | ICD-10-CM | POA: Diagnosis not present

## 2021-07-08 DIAGNOSIS — C679 Malignant neoplasm of bladder, unspecified: Secondary | ICD-10-CM

## 2021-07-08 NOTE — Progress Notes (Signed)
[name] is here today for follow up post radiation to the lung.  Lung Side: left  Completed radiation treatment on: 10/16/2020  Does the patient complain of any of the following: Pain:left rib pain mostly at night Shortness of breath w/wo exertion: shortness of breath with activity Cough: denies Hemoptysis: denies Pain with swallowing: denies Swallowing/choking concerns: denies difficulty swallowing, occasionally gets choked if she eats too quickly Appetite: good Energy Level: occasional fatigue Post radiation skin Changes: has a "knot" on the back of left shoulder   Additional comments if applicable: none  Vitals:   07/08/21 1010  BP: (!) 127/93  Pulse: 81  Resp: 20  Temp: 97.8 F (36.6 C)  SpO2: 100%  Weight: 190 lb (86.2 kg)  Height: 5\' 8"  (1.727 m)

## 2021-07-21 ENCOUNTER — Encounter: Payer: Self-pay | Admitting: Pulmonary Disease

## 2021-07-21 ENCOUNTER — Ambulatory Visit (INDEPENDENT_AMBULATORY_CARE_PROVIDER_SITE_OTHER): Payer: Medicare Other | Admitting: Pulmonary Disease

## 2021-07-21 ENCOUNTER — Other Ambulatory Visit: Payer: Self-pay

## 2021-07-21 VITALS — BP 104/60 | HR 96 | Temp 98.6°F | Ht 68.0 in | Wt 190.2 lb

## 2021-07-21 DIAGNOSIS — C3492 Malignant neoplasm of unspecified part of left bronchus or lung: Secondary | ICD-10-CM

## 2021-07-21 DIAGNOSIS — R918 Other nonspecific abnormal finding of lung field: Secondary | ICD-10-CM | POA: Insufficient documentation

## 2021-07-21 NOTE — Progress Notes (Signed)
Synopsis: Referred in November 2021 for abnormal CT chest .  By [name], MD  Subjective:   PATIENT ID: [name] GENDER: female DOB: 08/18/1956, MRN: [id number]  Chief Complaint  Patient presents with   Follow-up    Adenocarcinoma     this is a 65 year old female, past medical history of depression, anxiety, current tobacco abuse.  She initially seen by her primary care provider which encouraged her to have a lung cancer screening CT.  She had this completed in October 2021.  The CT scan revealed bilateral lower lobe cystic peripheral based lesions both greater than 4 cm in size concerning for multifocal adenocarcinoma.  Patient has no respiratory symptoms at this time.  She is a current smoker.  She has smoked for 41 years at approximately 1 pack/day.  She presents today in the office with her husband and her daughter via phone.  Long discussion today regarding CT imaging as well as next appropriate steps.  Patient denies fevers chills night sweats weight loss or hemoptysis.  OV 08/28/2020: Patient here today for follow-up after recent bronchoscopy.  Patient has a primary left lower lobe adenocarcinoma and likely has a carcinoma in the left lower lobe however tissue biopsy was inconclusive on this side both are very irregular cystic shaped lesions concerning for multifocal adenocarcinoma.  PET scan was relatively equivocal with him low to moderate SUV uptake.  Patient was referred to thoracic surgery.  Case was discussed with Dr. Kipp Brood today because she was seen earlier today in the office.  She is also been referred for evaluation of an irregular shaped bladder lesion that was found on PET scan.  Pulmonary function tests have also been completed during this time and PFTs revealed a ratio of 57, a FEV1 of 1.2 L, 57% predicted evidence of air trapping with an RV of 133% and a DLCO of 41.  Today, patient is very anxious.  She met with thoracic surgery today.  Tearful today in the  office about everything that is been going on.  Also worried about the possible bladder lesion that was found on PET scan.  OV 01/20/2021: Patient here today for follow-up regarding complaints of left-sided chest pain.  This happens to be predominantly associated with big deep breaths moving around.  She can pinpoint to the pain that is located just underneath her left bra line and underneath her left breast.  She feels like the skin is tender right there.  She is not really done many things over-the-counter to see if it makes a difference.  But she says it comes on and off occasionally throughout the day.  She has been using her inhaler.  She has not had any recent follow-up with radiation oncology.  She was seen in the emergency department in April for similar complaints of chest pain.  She had a CTA of the chest that was negative for PE.  Also reviewed these images today with patient in the office after her 10 treatments of SBRT to the left lower lobe adenocarcinoma.  The right cystic-appearing lesion appears stable in comparison to previous imaging.  OV 02/15/2021: Here today for follow-up.  Overall from respiratory standpoint doing well using her inhalers.  Had recent follow-up with radiation oncology.  She does have a CT scan planned for October 2022.  I discussed patient's case with radiation oncology a few weeks ago.  She is feeling less anxious.  Overall in a better mood in comparison to where she was several months  ago with new diagnosis and abnormal imaging.  OV 07/21/2021: Here today for follow up regarding. Recently had ct imaging of the chest in September 2022. The right lower lobe lesion that we were following has increased slightly in size. She tolerated the SBRT to the LLL lesion well. Patient denies fevers chils, weight loss. She has quit smoking.    Past Medical History:  Diagnosis Date   Abnormal CT lung screening 07/10/2020   Anxiety    Arthritis    Bladder cancer ([facility]) 09/2020    urologist--- dr Tresa Moore, incidental finding on PET scan 12/ 2021,  s/p TURBT 09-18-2020 high grade papillary urothelial    Centrilobular emphysema ([place])    Close exposure to COVID-19 virus 05/03/2019   Constipation 04/09/2019   DDD (degenerative disc disease), lumbar    Depression    DOE (dyspnea on exertion)    10-29-2020  per pt can do house work without sob, when walks/ stairs stops frequently  (stated recovers quickly when stops)   Epidermoid cyst 10/29/2018   Recurrent. Located on left hip at iliac crest 2" anterior to midaxillary line. Removed 2013, 2020, 2022.    Epigastric pain 11/16/2020   Fibroid 2003   GERD (gastroesophageal reflux disease)    History of radiation therapy    Left lung- 10/06/20-10/16/20- Dr. Gery Pray   Leg cramping 11/13/2019   Menopausal vaginal dryness 11/13/2019   Primary adenocarcinoma of lower lobe of left lung ([place]) 07/2020   oncologist--- dr Julien Nordmann---  dx 11/ 2021 bilateral lower lobe masses,  s/p bronchoscopy w/ bx's 07-28-2020 malignancy cells on left ;  completed SBRT bilateral lower lung 10-16-2020 5 fractions   Radiation-induced dermatitis    on 10-29-2020 pt complaint stomach and chest areas,  completed SBRT 10-16-2020   Seasonal allergies    Stage 3 severe COPD by GOLD classification [facility])    pulmonologist--- dr Valeta Harms,  using anoro inhaler daily   Wears glasses      Family History  Problem Relation Age of Onset   Hypertension Mother    Hyperlipidemia Mother    Dementia Father    Lung cancer Father    Parkinson's disease Father    Early death Sister 76       Drug overdose   Throat cancer Brother    Hypertension Daughter    Diabetes Daughter      Past Surgical History:  Procedure Laterality Date   ANTERIOR CERVICAL DECOMP/DISCECTOMY FUSION  03-08-2003 @ [facility]   C3 --- C6   ARTHROTOMY Right 11/09/2015   Procedure: RIGHT WRIST DISTAL RADIAL ULNAR JOINT ARTHROTOMY AND DEBRIDEMENT,  AND ;  Surgeon: Iran Planas, MD;  Location: [facility];   Service: Orthopedics;  Laterality: Right;   BRONCHIAL BIOPSY  07/28/2020   Procedure: BRONCHIAL BIOPSIES;  Surgeon: [name], DO;  Location: [facility];  Service: Pulmonary;;   BRONCHIAL BRUSHINGS  07/28/2020   Procedure: BRONCHIAL BRUSHINGS;  Surgeon: [name], DO;  Location: [facility];  Service: Pulmonary;;   BRONCHIAL NEEDLE ASPIRATION BIOPSY  07/28/2020   Procedure: BRONCHIAL NEEDLE ASPIRATION BIOPSIES;  Surgeon: [name], DO;  Location: [facility];  Service: Pulmonary;;   BRONCHIAL WASHINGS  07/28/2020   Procedure: BRONCHIAL WASHINGS;  Surgeon: [name], DO;  Location: [facility];  Service: Pulmonary;;   COLONOSCOPY     CYSTOSCOPY WITH RETROGRADE PYELOGRAM, URETEROSCOPY AND STENT PLACEMENT N/A 09/18/2020   Procedure: CYSTOSCOPY WITH RETROGRADE PYELOGRAM bilateral,URETEROSCOPY AND  STENT PLACEMENT right ureter;  Surgeon: Alexis Frock, MD;  [facility];  Service: Urology;  Laterality: N/A;  1 HR   HARDWARE REMOVAL Right 07/24/2013   Procedure: RIGHT WRIST HARDWARE REMOVAL, JOINT RELEASE, RIGHT HAND MANIPULATION UNDER ANESTHESIA;  Surgeon: Linna Hoff, MD;  Location: [facility];  Service: Orthopedics;  Laterality: Right;   OPEN REDUCTION INTERNAL FIXATION (ORIF) DISTAL RADIAL FRACTURE Right 11/08/2012   Procedure: OPEN REDUCTION INTERNAL FIXATION (ORIF) RIGHT DISTAL RADIUS FRACTURE;  Surgeon: Linna Hoff, MD;  Location: [facility];  Service: Orthopedics;  Laterality: Right;   TRANSURETHRAL RESECTION OF BLADDER TUMOR N/A 09/18/2020   Procedure: TRANSURETHRAL RESECTION OF BLADDER TUMOR (TURBT);  Surgeon: Alexis Frock, MD;  [facility];  Service: Urology;  Laterality: N/A;   TRANSURETHRAL RESECTION OF BLADDER TUMOR N/A 11/04/2020   Procedure: RESTAGING TRANSURETHRAL RESECTION OF BLADDER TUMOR (TURBT); BILATERAL RETROGRADE PYELOGRAM;  Surgeon: Alexis Frock, MD;  Location: [facility];  Service:  Urology;  Laterality: N/A;  1 HR   VIDEO BRONCHOSCOPY WITH ENDOBRONCHIAL NAVIGATION N/A 07/28/2020   Procedure: VIDEO BRONCHOSCOPY WITH ENDOBRONCHIAL NAVIGATION;  Surgeon: [name], DO;  Location: [place];  Service: Pulmonary;  Laterality: N/A;   WRIST ARTHROPLASTY Right 11/09/2015   Procedure: OR DISTAL ULNAR RESECTION ARTHROPLASTY ;  Surgeon: Iran Planas, MD;  Location: [place];  Service: Orthopedics;  Laterality: Right;   WRIST ARTHROSCOPY WITH DEBRIDEMENT Right 07/21/2014   Procedure: RIGHT WRIST DISTAL RADIAL ULNAR JOINT DEBRIDEMENT AND JOINT RELEASE POSSIBLE TENDON INTERPOSITION;  Surgeon: Linna Hoff, MD;  Location: [facility];  Service: Orthopedics;  Laterality: Right;    Social History   Socioeconomic History   Marital status: Married    Spouse name: Mallie Mussel Carmen   Number of children: 2   Years of education: 12   Highest education level: Not on file  Occupational History   Occupation: Unemployed     Employer:  [employer]    Comment: Sweep   Tobacco Use   Smoking status: Former    Packs/day: 1.00    Years: 50.00    Pack years: 50.00    Types: Cigarettes    Start date: 04/13/1975    Quit date: 07/14/2020    Years since quitting: 1.0   Smokeless tobacco: Never  Vaping Use   Vaping Use: Never used  Substance and Sexual Activity   Alcohol use: Yes    Comment: Holidays only   Drug use: Never   Sexual activity: Not Currently    Partners: Male    Birth control/protection: Post-menopausal  Other Topics Concern   Not on file  Social History Narrative   Patient lives alone currently in [place].    Patient is married, they are living separately at this time.    Patient enjoys watching tv, cooking, and spending time with her children and grandchildren.    Social Determinants of Health   Financial Resource Strain: Not on file  Food Insecurity: Not on file  Transportation Needs: Not on file  Physical Activity: Not on file  Stress: Not on file  Social Connections:  Not on file  Intimate Partner Violence: Not on file     Allergies  Allergen Reactions   Codeine Nausea And Vomiting and Other (See Comments)    Hallucinations   Prednisone Other (See Comments)    'Made me feel funny inside my head"-pt cannot be specific     Outpatient Medications Prior to Visit  Medication Sig Dispense Refill   acetaminophen (TYLENOL) 650 MG CR tablet Take 1,300 mg  by mouth every 8 (eight) hours as needed for pain.     albuterol (VENTOLIN HFA) 108 (90 Base) MCG/ACT inhaler USE 2 INHALATIONS BY MOUTH  INTO THE LUNGS EVERY 6  HOURS AS NEEDED FOR  SHORTNESS OF BREATH OR  WHEEZING 13.4 g 6   amoxicillin (AMOXIL) 500 MG capsule Take 500 mg by mouth 3 (three) times daily.     Ascorbic Acid (VITAMIN C ADULT GUMMIES PO) Take 1 tablet by mouth daily.     atorvastatin (LIPITOR) 40 MG tablet Take 1 tablet (40 mg total) by mouth daily. (Patient taking differently: Take 40 mg by mouth at bedtime.) 90 tablet 3   azelastine (OPTIVAR) 0.05 % ophthalmic solution Place 1 drop into both eyes 2 (two) times daily as needed for allergies.     baclofen (LIORESAL) 10 MG tablet Take 0.5 tablets (5 mg total) by mouth 3 (three) times daily. 30 each 0   carbamide peroxide (DEBROX) 6.5 % OTIC solution Place 5 drops into both ears 2 (two) times daily. 15 mL 0   cetirizine (ZYRTEC) 10 MG tablet Take 1 tablet (10 mg total) by mouth daily. 30 tablet 6   chlorhexidine (PERIDEX) 0.12 % solution SMARTSIG:0.5 Ounce(s) By Mouth Twice Daily     clobetasol cream (TEMOVATE) 1.54 % Apply 1 application topically 2 (two) times daily as needed. (Patient taking differently: Apply 1 application topically 2 (two) times daily as needed (psoriasis).) 30 g 2   diclofenac Sodium (VOLTAREN) 1 % GEL Apply 2 g topically 4 (four) times daily. Apply to painful area of ribs. Need to use regularly for relief. 100 g 2   fluticasone (FLONASE) 50 MCG/ACT nasal spray Place 2 sprays into both nostrils daily. 16 g 6   gabapentin  (NEURONTIN) 300 MG capsule Take 1 capsule (300 mg total) by mouth at bedtime. After 4 days increase to 2 capsules (600 mg total) by mouth at bedtime. 60 capsule 1   hydrOXYzine (ATARAX/VISTARIL) 50 MG tablet Take 1 tablet (50 mg total) by mouth at bedtime as needed for anxiety or itching (sleep). 30 tablet 1   lidocaine (LIDODERM) 5 % Place 1 patch onto the skin daily. Remove & Discard patch within 12 hours or as directed by MD (Patient not taking: No sig reported) 30 patch 0   Melatonin 10 MG TBCR Take 10 mg by mouth at bedtime. 90 tablet 2   pantoprazole (PROTONIX) 40 MG tablet Take 1 tablet (40 mg total) by mouth daily. 30 tablet 3   polyethylene glycol powder (GLYCOLAX/MIRALAX) 17 GM/SCOOP powder Take 17 g by mouth daily. 3350 g 1   traMADol (ULTRAM) 50 MG tablet Take 1 tablet (50 mg total) by mouth every 6 (six) hours as needed for moderate pain or severe pain. (Patient not taking: Reported on 07/08/2021) 30 tablet 1   venlafaxine XR (EFFEXOR-XR) 75 MG 24 hr capsule TAKE 1 CAPSULE(75 MG) BY MOUTH DAILY WITH BREAKFAST 90 capsule 0   No facility-administered medications prior to visit.    Review of Systems  Constitutional:  Negative for chills, fever, malaise/fatigue and weight loss.  HENT:  Negative for hearing loss, sore throat and tinnitus.   Eyes:  Negative for blurred vision and double vision.  Respiratory:  Positive for cough and shortness of breath. Negative for hemoptysis, sputum production, wheezing and stridor.   Cardiovascular:  Negative for chest pain, palpitations, orthopnea, leg swelling and PND.  Gastrointestinal:  Negative for abdominal pain, constipation, diarrhea, heartburn, nausea and vomiting.  Genitourinary:  Negative  for dysuria, hematuria and urgency.  Musculoskeletal:  Negative for joint pain and myalgias.  Skin:  Negative for itching and rash.  Neurological:  Negative for dizziness, tingling, weakness and headaches.  Endo/Heme/Allergies:  Negative for environmental  allergies. Does not bruise/bleed easily.  Psychiatric/Behavioral:  Negative for depression. The patient is not nervous/anxious and does not have insomnia.   All other systems reviewed and are negative.   Objective:  Physical Exam Vitals reviewed.  Constitutional:      General: She is not in acute distress.    Appearance: She is well-developed.  HENT:     Head: Normocephalic and atraumatic.  Eyes:     General: No scleral icterus.    Conjunctiva/sclera: Conjunctivae normal.     Pupils: Pupils are equal, round, and reactive to light.  Neck:     Vascular: No JVD.     Trachea: No tracheal deviation.  Cardiovascular:     Rate and Rhythm: Normal rate and regular rhythm.     Heart sounds: Murmur heard.  Pulmonary:     Effort: Pulmonary effort is normal. No tachypnea, accessory muscle usage or respiratory distress.     Breath sounds: No stridor. No wheezing, rhonchi or rales.  Abdominal:     General: Bowel sounds are normal. There is no distension.     Palpations: Abdomen is soft.     Tenderness: There is no abdominal tenderness.  Musculoskeletal:        General: No tenderness.     Cervical back: Neck supple.  Lymphadenopathy:     Cervical: No cervical adenopathy.  Skin:    General: Skin is warm and dry.     Capillary Refill: Capillary refill takes less than 2 seconds.     Findings: No rash.  Neurological:     Mental Status: She is alert and oriented to person, place, and time.  Psychiatric:        Behavior: Behavior normal.     Vitals:   07/21/21 1509  BP: 104/60  Pulse: 96  Temp: 98.6 F (37 C)  TempSrc: Oral  SpO2: 96%  Weight: 190 lb 3.2 oz (86.3 kg)  Height: $Remove'5\' 8"'oBvEHfD$  (1.727 m)   96% on RA BMI Readings from Last 3 Encounters:  07/21/21 28.92 kg/m  07/08/21 28.89 kg/m  06/18/21 27.06 kg/m   Wt Readings from Last 3 Encounters:  07/21/21 190 lb 3.2 oz (86.3 kg)  07/08/21 190 lb (86.2 kg)  06/18/21 178 lb (80.7 kg)     CBC    Component Value Date/Time    WBC 7.6 12/17/2020 1029   RBC 4.36 12/17/2020 1029   HGB 12.2 12/17/2020 1029   HGB 13.1 08/18/2020 1348   HGB 14.1 06/23/2020 1237   HCT 38.7 12/17/2020 1029   HCT 43.6 06/23/2020 1237   PLT 337 12/17/2020 1029   PLT 360 08/18/2020 1348   PLT 366 06/23/2020 1237   MCV 88.8 12/17/2020 1029   MCV 86 06/23/2020 1237   MCH 28.0 12/17/2020 1029   MCHC 31.5 12/17/2020 1029   RDW 13.6 12/17/2020 1029   RDW 13.2 06/23/2020 1237   LYMPHSABS 1.3 12/17/2020 1029   LYMPHSABS 2.2 06/23/2020 1237   MONOABS 0.7 12/17/2020 1029   EOSABS 0.2 12/17/2020 1029   EOSABS 0.2 06/23/2020 1237   BASOSABS 0.1 12/17/2020 1029   BASOSABS 0.1 06/23/2020 1237     Chest Imaging:  Lung cancer screening CT October 2021: Bilateral cystic appearing masses concerning for multifocal adenocarcinoma. The patient's images have been  independently reviewed by me.    CT chest 12/17/2020: Stable cystic lesion within the right base, left lower lobe cystic subsolid lesion consistent with previous adenocarcinoma is stable.  Also has associated moderate emphysema.. The patient's images have been independently reviewed by me.    CT Chest September 2022: RLL cystic lesion. Slightly larger in size  The patient's images have been independently reviewed by me.     Pulmonary Functions Testing Results: PFT Results Latest Ref Rng & Units 08/03/2020  FVC-Pre L 2.03  FVC-Predicted Pre % 67  FVC-Post L 2.15  FVC-Predicted Post % 71  Pre FEV1/FVC % % 67  Post FEV1/FCV % % 57  FEV1-Pre L 1.37  FEV1-Predicted Pre % 58  FEV1-Post L 1.22  DLCO uncorrected ml/min/mmHg 9.46  DLCO UNC% % 41  DLCO corrected ml/min/mmHg 9.26  DLCO COR %Predicted % 40  DLVA Predicted % 57  TLC L 5.13  TLC % Predicted % 90  RV % Predicted % 133    FeNO:   Pathology:   Echocardiogram:   Heart Catheterization:     Assessment & Plan:     ICD-10-CM   1. Mass of lower lobe of right lung  R91.8 Ambulatory referral to Pulmonology     Procedural/ Surgical Case Request: VIDEO BRONCHOSCOPY WITH ENDOBRONCHIAL NAVIGATION    CT Super D Chest Wo Contrast    2. Adenocarcinoma, lung, left ([place])  C34.92 Ambulatory referral to Pulmonology    Procedural/ Surgical Case Request: VIDEO BRONCHOSCOPY WITH ENDOBRONCHIAL NAVIGATION    CT Super D Chest Wo Contrast      Discussion:  65 yo, severe COPD, reduced DLCO, centrilobar emphysema, LLL lesions was adeno s/p radiation, RLL was non-diagnostic. However suspected multifocal adeno at time of diagnosis.   Plan:  Follow up imaging with mild increase in size.  I think we need to re-biopsy this lesion  We discussed repeating and the patient is agreeable.  We discussed R/A/Bs  Tentative plan for bronchoscopy on 08/17/2021.  I will reach out to Dr. Sondra Come as well from rad onc to discuss.     Current Outpatient Medications:    acetaminophen (TYLENOL) 650 MG CR tablet, Take 1,300 mg by mouth every 8 (eight) hours as needed for pain., Disp: , Rfl:    albuterol (VENTOLIN HFA) 108 (90 Base) MCG/ACT inhaler, USE 2 INHALATIONS BY MOUTH  INTO THE LUNGS EVERY 6  HOURS AS NEEDED FOR  SHORTNESS OF BREATH OR  WHEEZING, Disp: 13.4 g, Rfl: 6   amoxicillin (AMOXIL) 500 MG capsule, Take 500 mg by mouth 3 (three) times daily., Disp: , Rfl:    Ascorbic Acid (VITAMIN C ADULT GUMMIES PO), Take 1 tablet by mouth daily., Disp: , Rfl:    atorvastatin (LIPITOR) 40 MG tablet, Take 1 tablet (40 mg total) by mouth daily. (Patient taking differently: Take 40 mg by mouth at bedtime.), Disp: 90 tablet, Rfl: 3   azelastine (OPTIVAR) 0.05 % ophthalmic solution, Place 1 drop into both eyes 2 (two) times daily as needed for allergies., Disp: , Rfl:    baclofen (LIORESAL) 10 MG tablet, Take 0.5 tablets (5 mg total) by mouth 3 (three) times daily., Disp: 30 each, Rfl: 0   carbamide peroxide (DEBROX) 6.5 % OTIC solution, Place 5 drops into both ears 2 (two) times daily., Disp: 15 mL, Rfl: 0   cetirizine (ZYRTEC) 10 MG  tablet, Take 1 tablet (10 mg total) by mouth daily., Disp: 30 tablet, Rfl: 6   chlorhexidine (PERIDEX) 0.12 % solution,  SMARTSIG:0.5 Ounce(s) By Mouth Twice Daily, Disp: , Rfl:    clobetasol cream (TEMOVATE) 1.53 %, Apply 1 application topically 2 (two) times daily as needed. (Patient taking differently: Apply 1 application topically 2 (two) times daily as needed (psoriasis).), Disp: 30 g, Rfl: 2   diclofenac Sodium (VOLTAREN) 1 % GEL, Apply 2 g topically 4 (four) times daily. Apply to painful area of ribs. Need to use regularly for relief., Disp: 100 g, Rfl: 2   fluticasone (FLONASE) 50 MCG/ACT nasal spray, Place 2 sprays into both nostrils daily., Disp: 16 g, Rfl: 6   gabapentin (NEURONTIN) 300 MG capsule, Take 1 capsule (300 mg total) by mouth at bedtime. After 4 days increase to 2 capsules (600 mg total) by mouth at bedtime., Disp: 60 capsule, Rfl: 1   hydrOXYzine (ATARAX/VISTARIL) 50 MG tablet, Take 1 tablet (50 mg total) by mouth at bedtime as needed for anxiety or itching (sleep)., Disp: 30 tablet, Rfl: 1   lidocaine (LIDODERM) 5 %, Place 1 patch onto the skin daily. Remove & Discard patch within 12 hours or as directed by MD (Patient not taking: No sig reported), Disp: 30 patch, Rfl: 0   Melatonin 10 MG TBCR, Take 10 mg by mouth at bedtime., Disp: 90 tablet, Rfl: 2   pantoprazole (PROTONIX) 40 MG tablet, Take 1 tablet (40 mg total) by mouth daily., Disp: 30 tablet, Rfl: 3   polyethylene glycol powder (GLYCOLAX/MIRALAX) 17 GM/SCOOP powder, Take 17 g by mouth daily., Disp: 3350 g, Rfl: 1   traMADol (ULTRAM) 50 MG tablet, Take 1 tablet (50 mg total) by mouth every 6 (six) hours as needed for moderate pain or severe pain. (Patient not taking: Reported on 07/08/2021), Disp: 30 tablet, Rfl: 1   venlafaxine XR (EFFEXOR-XR) 75 MG 24 hr capsule, TAKE 1 CAPSULE(75 MG) BY MOUTH DAILY WITH BREAKFAST, Disp: 90 capsule, Rfl: 0  I spent 42 minutes dedicated to the care of this patient on the date of this  encounter to include pre-visit review of records, face-to-face time with the patient discussing conditions above, post visit ordering of testing, clinical documentation with the electronic health record, making appropriate referrals as documented, and communicating necessary findings to members of the patients care team.     [name], DO [facility] 07/21/2021 3:27 PM

## 2021-07-21 NOTE — H&P (View-Only) (Signed)
Synopsis: Referred in November 2021 for abnormal CT chest .  By [name], MD  Subjective:   PATIENT ID: [name] GENDER: female DOB: [birth year], MRN: [id number]  Chief Complaint  Patient presents with   Follow-up    Adenocarcinoma     this is a 65 year old female, past medical history of depression, anxiety, current tobacco abuse.  She initially seen by her primary care provider which encouraged her to have a lung cancer screening CT.  She had this completed in October 2021.  The CT scan revealed bilateral lower lobe cystic peripheral based lesions both greater than 4 cm in size concerning for multifocal adenocarcinoma.  Patient has no respiratory symptoms at this time.  She is a current smoker.  She has smoked for 41 years at approximately 1 pack/day.  She presents today in the office with her husband and her daughter via phone.  Long discussion today regarding CT imaging as well as next appropriate steps.  Patient denies fevers chills night sweats weight loss or hemoptysis.  OV 08/28/2020: Patient here today for follow-up after recent bronchoscopy.  Patient has a primary left lower lobe adenocarcinoma and likely has a carcinoma in the left lower lobe however tissue biopsy was inconclusive on this side both are very irregular cystic shaped lesions concerning for multifocal adenocarcinoma.  PET scan was relatively equivocal with him low to moderate SUV uptake.  Patient was referred to thoracic surgery.  Case was discussed with Dr. Kipp Brood today because she was seen earlier today in the office.  She is also been referred for evaluation of an irregular shaped bladder lesion that was found on PET scan.  Pulmonary function tests have also been completed during this time and PFTs revealed a ratio of 57, a FEV1 of 1.2 L, 57% predicted evidence of air trapping with an RV of 133% and a DLCO of 41.  Today, patient is very anxious.  She met with thoracic surgery today.  Tearful today in the  office about everything that is been going on.  Also worried about the possible bladder lesion that was found on PET scan.  OV 01/20/2021: Patient here today for follow-up regarding complaints of left-sided chest pain.  This happens to be predominantly associated with big deep breaths moving around.  She can pinpoint to the pain that is located just underneath her left bra line and underneath her left breast.  She feels like the skin is tender right there.  She is not really done many things over-the-counter to see if it makes a difference.  But she says it comes on and off occasionally throughout the day.  She has been using her inhaler.  She has not had any recent follow-up with radiation oncology.  She was seen in the emergency department in April for similar complaints of chest pain.  She had a CTA of the chest that was negative for PE.  Also reviewed these images today with patient in the office after her 10 treatments of SBRT to the left lower lobe adenocarcinoma.  The right cystic-appearing lesion appears stable in comparison to previous imaging.  OV 02/15/2021: Here today for follow-up.  Overall from respiratory standpoint doing well using her inhalers.  Had recent follow-up with radiation oncology.  She does have a CT scan planned for October 2022.  I discussed patient's case with radiation oncology a few weeks ago.  She is feeling less anxious.  Overall in a better mood in comparison to where she was several months  ago with new diagnosis and abnormal imaging.  OV 07/21/2021: Here today for follow up regarding. Recently had ct imaging of the chest in September 2022. The right lower lobe lesion that we were following has increased slightly in size. She tolerated the SBRT to the LLL lesion well. Patient denies fevers chils, weight loss. She has quit smoking.    Past Medical History:  Diagnosis Date   Abnormal CT lung screening 07/10/2020   Anxiety    Arthritis    Bladder cancer ([place]) 09/2020    urologist--- dr Tresa Moore, incidental finding on PET scan 12/ 2021,  s/p TURBT 09-18-2020 high grade papillary urothelial    Centrilobular emphysema ([place])    Close exposure to COVID-19 virus 05/03/2019   Constipation 04/09/2019   DDD (degenerative disc disease), lumbar    Depression    DOE (dyspnea on exertion)    10-29-2020  per pt can do house work without sob, when walks/ stairs stops frequently  (stated recovers quickly when stops)   Epidermoid cyst 10/29/2018   Recurrent. Located on left hip at iliac crest 2" anterior to midaxillary line. Removed 2013, 2020, 2022.    Epigastric pain 11/16/2020   Fibroid 2003   GERD (gastroesophageal reflux disease)    History of radiation therapy    Left lung- 10/06/20-10/16/20- Dr. Gery Pray   Leg cramping 11/13/2019   Menopausal vaginal dryness 11/13/2019   Primary adenocarcinoma of lower lobe of left lung ([name]) 07/2020   oncologist--- dr Julien Nordmann---  dx 11/ 2021 bilateral lower lobe masses,  s/p bronchoscopy w/ bx's 07-28-2020 malignancy cells on left ;  completed SBRT bilateral lower lung 10-16-2020 5 fractions   Radiation-induced dermatitis    on 10-29-2020 pt complaint stomach and chest areas,  completed SBRT 10-16-2020   Seasonal allergies    Stage 3 severe COPD by GOLD classification [facility])    pulmonologist--- dr Valeta Harms,  using anoro inhaler daily   Wears glasses      Family History  Problem Relation Age of Onset   Hypertension Mother    Hyperlipidemia Mother    Dementia Father    Lung cancer Father    Parkinson's disease Father    Early death Sister 65       Drug overdose   Throat cancer Brother    Hypertension Daughter    Diabetes Daughter      Past Surgical History:  Procedure Laterality Date   ANTERIOR CERVICAL DECOMP/DISCECTOMY FUSION  03-08-2003 @ [place]   C3 --- C6   ARTHROTOMY Right 11/09/2015   Procedure: RIGHT WRIST DISTAL RADIAL ULNAR JOINT ARTHROTOMY AND DEBRIDEMENT,  AND ;  Surgeon: Iran Planas, MD;  Location: [place];   Service: Orthopedics;  Laterality: Right;   BRONCHIAL BIOPSY  07/28/2020   Procedure: BRONCHIAL BIOPSIES;  Surgeon: [name], DO;  Location: [facility];  Service: Pulmonary;;   BRONCHIAL BRUSHINGS  07/28/2020   Procedure: BRONCHIAL BRUSHINGS;  Surgeon: [name], DO;  Location: [facility];  Service: Pulmonary;;   BRONCHIAL NEEDLE ASPIRATION BIOPSY  07/28/2020   Procedure: BRONCHIAL NEEDLE ASPIRATION BIOPSIES;  Surgeon: [name], DO;  Location: [place];  Service: Pulmonary;;   BRONCHIAL WASHINGS  07/28/2020   Procedure: BRONCHIAL WASHINGS;  Surgeon: [name], DO;  Location: [place];  Service: Pulmonary;;   COLONOSCOPY     CYSTOSCOPY WITH RETROGRADE PYELOGRAM, URETEROSCOPY AND STENT PLACEMENT N/A 09/18/2020   Procedure: CYSTOSCOPY WITH RETROGRADE PYELOGRAM bilateral,URETEROSCOPY AND  STENT PLACEMENT right ureter;  Surgeon: Alexis Frock, MD;  Location: [facility];  Service: Urology;  Laterality: N/A;  1 HR   HARDWARE REMOVAL Right 07/24/2013   Procedure: RIGHT WRIST HARDWARE REMOVAL, JOINT RELEASE, RIGHT HAND MANIPULATION UNDER ANESTHESIA;  Surgeon: Linna Hoff, MD;  Location: [place];  Service: Orthopedics;  Laterality: Right;   OPEN REDUCTION INTERNAL FIXATION (ORIF) DISTAL RADIAL FRACTURE Right 11/08/2012   Procedure: OPEN REDUCTION INTERNAL FIXATION (ORIF) RIGHT DISTAL RADIUS FRACTURE;  Surgeon: Linna Hoff, MD;  Location: [place];  Service: Orthopedics;  Laterality: Right;   TRANSURETHRAL RESECTION OF BLADDER TUMOR N/A 09/18/2020   Procedure: TRANSURETHRAL RESECTION OF BLADDER TUMOR (TURBT);  Surgeon: Alexis Frock, MD;  Location: [facility];  Service: Urology;  Laterality: N/A;   TRANSURETHRAL RESECTION OF BLADDER TUMOR N/A 11/04/2020   Procedure: RESTAGING TRANSURETHRAL RESECTION OF BLADDER TUMOR (TURBT); BILATERAL RETROGRADE PYELOGRAM;  Surgeon: Alexis Frock, MD;  Location: [facility];  Service:  Urology;  Laterality: N/A;  1 HR   VIDEO BRONCHOSCOPY WITH ENDOBRONCHIAL NAVIGATION N/A 07/28/2020   Procedure: VIDEO BRONCHOSCOPY WITH ENDOBRONCHIAL NAVIGATION;  Surgeon: [name], DO;  Location: [place];  Service: Pulmonary;  Laterality: N/A;   WRIST ARTHROPLASTY Right 11/09/2015   Procedure: OR DISTAL ULNAR RESECTION ARTHROPLASTY ;  Surgeon: Iran Planas, MD;  Location: [place];  Service: Orthopedics;  Laterality: Right;   WRIST ARTHROSCOPY WITH DEBRIDEMENT Right 07/21/2014   Procedure: RIGHT WRIST DISTAL RADIAL ULNAR JOINT DEBRIDEMENT AND JOINT RELEASE POSSIBLE TENDON INTERPOSITION;  Surgeon: Linna Hoff, MD;  Location: [place];  Service: Orthopedics;  Laterality: Right;    Social History   Socioeconomic History   Marital status: Married    Spouse name: Mallie Mussel Shelvin   Number of children: 2   Years of education: 12   Highest education level: Not on file  Occupational History   Occupation: Unemployed     Employer:  [employer]    Comment: Sweep   Tobacco Use   Smoking status: Former    Packs/day: 1.00    Years: 50.00    Pack years: 50.00    Types: Cigarettes    Start date: 04/13/1975    Quit date: 07/14/2020    Years since quitting: 1.0   Smokeless tobacco: Never  Vaping Use   Vaping Use: Never used  Substance and Sexual Activity   Alcohol use: Yes    Comment: Holidays only   Drug use: Never   Sexual activity: Not Currently    Partners: Male    Birth control/protection: Post-menopausal  Other Topics Concern   Not on file  Social History Narrative   Patient lives alone currently in [place].    Patient is married, they are living separately at this time.    Patient enjoys watching tv, cooking, and spending time with her children and grandchildren.    Social Determinants of Health   Financial Resource Strain: Not on file  Food Insecurity: Not on file  Transportation Needs: Not on file  Physical Activity: Not on file  Stress: Not on file  Social Connections:  Not on file  Intimate Partner Violence: Not on file     Allergies  Allergen Reactions   Codeine Nausea And Vomiting and Other (See Comments)    Hallucinations   Prednisone Other (See Comments)    'Made me feel funny inside my head"-pt cannot be specific     Outpatient Medications Prior to Visit  Medication Sig Dispense Refill   acetaminophen (TYLENOL) 650 MG CR tablet Take 1,300 mg  by mouth every 8 (eight) hours as needed for pain.     albuterol (VENTOLIN HFA) 108 (90 Base) MCG/ACT inhaler USE 2 INHALATIONS BY MOUTH  INTO THE LUNGS EVERY 6  HOURS AS NEEDED FOR  SHORTNESS OF BREATH OR  WHEEZING 13.4 g 6   amoxicillin (AMOXIL) 500 MG capsule Take 500 mg by mouth 3 (three) times daily.     Ascorbic Acid (VITAMIN C ADULT GUMMIES PO) Take 1 tablet by mouth daily.     atorvastatin (LIPITOR) 40 MG tablet Take 1 tablet (40 mg total) by mouth daily. (Patient taking differently: Take 40 mg by mouth at bedtime.) 90 tablet 3   azelastine (OPTIVAR) 0.05 % ophthalmic solution Place 1 drop into both eyes 2 (two) times daily as needed for allergies.     baclofen (LIORESAL) 10 MG tablet Take 0.5 tablets (5 mg total) by mouth 3 (three) times daily. 30 each 0   carbamide peroxide (DEBROX) 6.5 % OTIC solution Place 5 drops into both ears 2 (two) times daily. 15 mL 0   cetirizine (ZYRTEC) 10 MG tablet Take 1 tablet (10 mg total) by mouth daily. 30 tablet 6   chlorhexidine (PERIDEX) 0.12 % solution SMARTSIG:0.5 Ounce(s) By Mouth Twice Daily     clobetasol cream (TEMOVATE) 3.53 % Apply 1 application topically 2 (two) times daily as needed. (Patient taking differently: Apply 1 application topically 2 (two) times daily as needed (psoriasis).) 30 g 2   diclofenac Sodium (VOLTAREN) 1 % GEL Apply 2 g topically 4 (four) times daily. Apply to painful area of ribs. Need to use regularly for relief. 100 g 2   fluticasone (FLONASE) 50 MCG/ACT nasal spray Place 2 sprays into both nostrils daily. 16 g 6   gabapentin  (NEURONTIN) 300 MG capsule Take 1 capsule (300 mg total) by mouth at bedtime. After 4 days increase to 2 capsules (600 mg total) by mouth at bedtime. 60 capsule 1   hydrOXYzine (ATARAX/VISTARIL) 50 MG tablet Take 1 tablet (50 mg total) by mouth at bedtime as needed for anxiety or itching (sleep). 30 tablet 1   lidocaine (LIDODERM) 5 % Place 1 patch onto the skin daily. Remove & Discard patch within 12 hours or as directed by MD (Patient not taking: No sig reported) 30 patch 0   Melatonin 10 MG TBCR Take 10 mg by mouth at bedtime. 90 tablet 2   pantoprazole (PROTONIX) 40 MG tablet Take 1 tablet (40 mg total) by mouth daily. 30 tablet 3   polyethylene glycol powder (GLYCOLAX/MIRALAX) 17 GM/SCOOP powder Take 17 g by mouth daily. 3350 g 1   traMADol (ULTRAM) 50 MG tablet Take 1 tablet (50 mg total) by mouth every 6 (six) hours as needed for moderate pain or severe pain. (Patient not taking: Reported on 07/08/2021) 30 tablet 1   venlafaxine XR (EFFEXOR-XR) 75 MG 24 hr capsule TAKE 1 CAPSULE(75 MG) BY MOUTH DAILY WITH BREAKFAST 90 capsule 0   No facility-administered medications prior to visit.    Review of Systems  Constitutional:  Negative for chills, fever, malaise/fatigue and weight loss.  HENT:  Negative for hearing loss, sore throat and tinnitus.   Eyes:  Negative for blurred vision and double vision.  Respiratory:  Positive for cough and shortness of breath. Negative for hemoptysis, sputum production, wheezing and stridor.   Cardiovascular:  Negative for chest pain, palpitations, orthopnea, leg swelling and PND.  Gastrointestinal:  Negative for abdominal pain, constipation, diarrhea, heartburn, nausea and vomiting.  Genitourinary:  Negative  for dysuria, hematuria and urgency.  Musculoskeletal:  Negative for joint pain and myalgias.  Skin:  Negative for itching and rash.  Neurological:  Negative for dizziness, tingling, weakness and headaches.  Endo/Heme/Allergies:  Negative for environmental  allergies. Does not bruise/bleed easily.  Psychiatric/Behavioral:  Negative for depression. The patient is not nervous/anxious and does not have insomnia.   All other systems reviewed and are negative.   Objective:  Physical Exam Vitals reviewed.  Constitutional:      General: She is not in acute distress.    Appearance: She is well-developed.  HENT:     Head: Normocephalic and atraumatic.  Eyes:     General: No scleral icterus.    Conjunctiva/sclera: Conjunctivae normal.     Pupils: Pupils are equal, round, and reactive to light.  Neck:     Vascular: No JVD.     Trachea: No tracheal deviation.  Cardiovascular:     Rate and Rhythm: Normal rate and regular rhythm.     Heart sounds: Murmur heard.  Pulmonary:     Effort: Pulmonary effort is normal. No tachypnea, accessory muscle usage or respiratory distress.     Breath sounds: No stridor. No wheezing, rhonchi or rales.  Abdominal:     General: Bowel sounds are normal. There is no distension.     Palpations: Abdomen is soft.     Tenderness: There is no abdominal tenderness.  Musculoskeletal:        General: No tenderness.     Cervical back: Neck supple.  Lymphadenopathy:     Cervical: No cervical adenopathy.  Skin:    General: Skin is warm and dry.     Capillary Refill: Capillary refill takes less than 2 seconds.     Findings: No rash.  Neurological:     Mental Status: She is alert and oriented to person, place, and time.  Psychiatric:        Behavior: Behavior normal.     Vitals:   07/21/21 1509  BP: 104/60  Pulse: 96  Temp: 98.6 F (37 C)  TempSrc: Oral  SpO2: 96%  Weight: 190 lb 3.2 oz (86.3 kg)  Height: $Remove'5\' 8"'[id number]$  (1.727 m)   96% on RA BMI Readings from Last 3 Encounters:  07/21/21 28.92 kg/m  07/08/21 28.89 kg/m  06/18/21 27.06 kg/m   Wt Readings from Last 3 Encounters:  07/21/21 190 lb 3.2 oz (86.3 kg)  07/08/21 190 lb (86.2 kg)  06/18/21 178 lb (80.7 kg)     CBC    Component Value Date/Time    WBC 7.6 12/17/2020 1029   RBC 4.36 12/17/2020 1029   HGB 12.2 12/17/2020 1029   HGB 13.1 08/18/2020 1348   HGB 14.1 06/23/2020 1237   HCT 38.7 12/17/2020 1029   HCT 43.6 06/23/2020 1237   PLT 337 12/17/2020 1029   PLT 360 08/18/2020 1348   PLT 366 06/23/2020 1237   MCV 88.8 12/17/2020 1029   MCV 86 06/23/2020 1237   MCH 28.0 12/17/2020 1029   MCHC 31.5 12/17/2020 1029   RDW 13.6 12/17/2020 1029   RDW 13.2 06/23/2020 1237   LYMPHSABS 1.3 12/17/2020 1029   LYMPHSABS 2.2 06/23/2020 1237   MONOABS 0.7 12/17/2020 1029   EOSABS 0.2 12/17/2020 1029   EOSABS 0.2 06/23/2020 1237   BASOSABS 0.1 12/17/2020 1029   BASOSABS 0.1 06/23/2020 1237     Chest Imaging:  Lung cancer screening CT October 2021: Bilateral cystic appearing masses concerning for multifocal adenocarcinoma. The patient's images have been  independently reviewed by me.    CT chest 12/17/2020: Stable cystic lesion within the right base, left lower lobe cystic subsolid lesion consistent with previous adenocarcinoma is stable.  Also has associated moderate emphysema.. The patient's images have been independently reviewed by me.    CT Chest September 2022: RLL cystic lesion. Slightly larger in size  The patient's images have been independently reviewed by me.     Pulmonary Functions Testing Results: PFT Results Latest Ref Rng & Units 08/03/2020  FVC-Pre L 2.03  FVC-Predicted Pre % 67  FVC-Post L 2.15  FVC-Predicted Post % 71  Pre FEV1/FVC % % 67  Post FEV1/FCV % % 57  FEV1-Pre L 1.37  FEV1-Predicted Pre % 58  FEV1-Post L 1.22  DLCO uncorrected ml/min/mmHg 9.46  DLCO UNC% % 41  DLCO corrected ml/min/mmHg 9.26  DLCO COR %Predicted % 40  DLVA Predicted % 57  TLC L 5.13  TLC % Predicted % 90  RV % Predicted % 133    FeNO:   Pathology:   Echocardiogram:   Heart Catheterization:     Assessment & Plan:     ICD-10-CM   1. Mass of lower lobe of right lung  R91.8 Ambulatory referral to Pulmonology     Procedural/ Surgical Case Request: VIDEO BRONCHOSCOPY WITH ENDOBRONCHIAL NAVIGATION    CT Super D Chest Wo Contrast    2. Adenocarcinoma, lung, left ([place])  C34.92 Ambulatory referral to Pulmonology    Procedural/ Surgical Case Request: VIDEO BRONCHOSCOPY WITH ENDOBRONCHIAL NAVIGATION    CT Super D Chest Wo Contrast      Discussion:  65 yo, severe COPD, reduced DLCO, centrilobar emphysema, LLL lesions was adeno s/p radiation, RLL was non-diagnostic. However suspected multifocal adeno at time of diagnosis.   Plan:  Follow up imaging with mild increase in size.  I think we need to re-biopsy this lesion  We discussed repeating and the patient is agreeable.  We discussed R/A/Bs  Tentative plan for bronchoscopy on 08/17/2021.  I will reach out to Dr. Sondra Come as well from rad onc to discuss.     Current Outpatient Medications:    acetaminophen (TYLENOL) 650 MG CR tablet, Take 1,300 mg by mouth every 8 (eight) hours as needed for pain., Disp: , Rfl:    albuterol (VENTOLIN HFA) 108 (90 Base) MCG/ACT inhaler, USE 2 INHALATIONS BY MOUTH  INTO THE LUNGS EVERY 6  HOURS AS NEEDED FOR  SHORTNESS OF BREATH OR  WHEEZING, Disp: 13.4 g, Rfl: 6   amoxicillin (AMOXIL) 500 MG capsule, Take 500 mg by mouth 3 (three) times daily., Disp: , Rfl:    Ascorbic Acid (VITAMIN C ADULT GUMMIES PO), Take 1 tablet by mouth daily., Disp: , Rfl:    atorvastatin (LIPITOR) 40 MG tablet, Take 1 tablet (40 mg total) by mouth daily. (Patient taking differently: Take 40 mg by mouth at bedtime.), Disp: 90 tablet, Rfl: 3   azelastine (OPTIVAR) 0.05 % ophthalmic solution, Place 1 drop into both eyes 2 (two) times daily as needed for allergies., Disp: , Rfl:    baclofen (LIORESAL) 10 MG tablet, Take 0.5 tablets (5 mg total) by mouth 3 (three) times daily., Disp: 30 each, Rfl: 0   carbamide peroxide (DEBROX) 6.5 % OTIC solution, Place 5 drops into both ears 2 (two) times daily., Disp: 15 mL, Rfl: 0   cetirizine (ZYRTEC) 10 MG  tablet, Take 1 tablet (10 mg total) by mouth daily., Disp: 30 tablet, Rfl: 6   chlorhexidine (PERIDEX) 0.12 % solution,  SMARTSIG:0.5 Ounce(s) By Mouth Twice Daily, Disp: , Rfl:    clobetasol cream (TEMOVATE) 2.86 %, Apply 1 application topically 2 (two) times daily as needed. (Patient taking differently: Apply 1 application topically 2 (two) times daily as needed (psoriasis).), Disp: 30 g, Rfl: 2   diclofenac Sodium (VOLTAREN) 1 % GEL, Apply 2 g topically 4 (four) times daily. Apply to painful area of ribs. Need to use regularly for relief., Disp: 100 g, Rfl: 2   fluticasone (FLONASE) 50 MCG/ACT nasal spray, Place 2 sprays into both nostrils daily., Disp: 16 g, Rfl: 6   gabapentin (NEURONTIN) 300 MG capsule, Take 1 capsule (300 mg total) by mouth at bedtime. After 4 days increase to 2 capsules (600 mg total) by mouth at bedtime., Disp: 60 capsule, Rfl: 1   hydrOXYzine (ATARAX/VISTARIL) 50 MG tablet, Take 1 tablet (50 mg total) by mouth at bedtime as needed for anxiety or itching (sleep)., Disp: 30 tablet, Rfl: 1   lidocaine (LIDODERM) 5 %, Place 1 patch onto the skin daily. Remove & Discard patch within 12 hours or as directed by MD (Patient not taking: No sig reported), Disp: 30 patch, Rfl: 0   Melatonin 10 MG TBCR, Take 10 mg by mouth at bedtime., Disp: 90 tablet, Rfl: 2   pantoprazole (PROTONIX) 40 MG tablet, Take 1 tablet (40 mg total) by mouth daily., Disp: 30 tablet, Rfl: 3   polyethylene glycol powder (GLYCOLAX/MIRALAX) 17 GM/SCOOP powder, Take 17 g by mouth daily., Disp: 3350 g, Rfl: 1   traMADol (ULTRAM) 50 MG tablet, Take 1 tablet (50 mg total) by mouth every 6 (six) hours as needed for moderate pain or severe pain. (Patient not taking: Reported on 07/08/2021), Disp: 30 tablet, Rfl: 1   venlafaxine XR (EFFEXOR-XR) 75 MG 24 hr capsule, TAKE 1 CAPSULE(75 MG) BY MOUTH DAILY WITH BREAKFAST, Disp: 90 capsule, Rfl: 0  I spent 42 minutes dedicated to the care of this patient on the date of this  encounter to include pre-visit review of records, face-to-face time with the patient discussing conditions above, post visit ordering of testing, clinical documentation with the electronic health record, making appropriate referrals as documented, and communicating necessary findings to members of the patients care team.     [name], DO [facility] 07/21/2021 3:27 PM

## 2021-07-21 NOTE — Patient Instructions (Signed)
Thank you for visiting [name] at [facility]. Today we recommend the following:  Orders Placed This Encounter  Procedures   Procedural/ Surgical Case Request: VIDEO BRONCHOSCOPY WITH ENDOBRONCHIAL NAVIGATION   CT Super D Chest Wo Contrast   Ambulatory referral to Pulmonology   Tentative bronchoscopy date on 08/17/2021  Return in about 1 month (around 08/23/2021) for with Eric Form, NP.    Please do your part to reduce the spread of COVID-19.

## 2021-08-09 ENCOUNTER — Ambulatory Visit (HOSPITAL_COMMUNITY)
Admission: RE | Admit: 2021-08-09 | Discharge: 2021-08-09 | Disposition: A | Discharge status: Home / Self Care | Payer: Medicare Other | Source: Ambulatory Visit | Attending: Pulmonary Disease | Admitting: Pulmonary Disease

## 2021-08-09 ENCOUNTER — Other Ambulatory Visit: Payer: Self-pay

## 2021-08-09 DIAGNOSIS — R918 Other nonspecific abnormal finding of lung field: Secondary | ICD-10-CM

## 2021-08-09 DIAGNOSIS — C3492 Malignant neoplasm of unspecified part of left bronchus or lung: Secondary | ICD-10-CM

## 2021-08-09 DIAGNOSIS — R911 Solitary pulmonary nodule: Secondary | ICD-10-CM | POA: Diagnosis not present

## 2021-08-13 ENCOUNTER — Other Ambulatory Visit: Payer: Self-pay | Admitting: Pulmonary Disease

## 2021-08-14 ENCOUNTER — Encounter (HOSPITAL_COMMUNITY): Payer: Self-pay | Admitting: Pulmonary Disease

## 2021-08-14 ENCOUNTER — Other Ambulatory Visit: Payer: Self-pay

## 2021-08-14 LAB — SARS CORONAVIRUS 2 (TAT 6-24 HRS): SARS Coronavirus 2: NEGATIVE

## 2021-08-14 NOTE — Progress Notes (Signed)
PCP - Dr. Deveron Furlong  Cardiologist - Denies  EP-Denies  Endocrine-Denies  Pulm-Denies  Chest x-ray - 12/17/20 (E)  EKG - 04/21/21 (E)  Stress Test - Denies  ECHO - Denies  Cardiac Cath - Denies  AICD-na PM-na LOOP-na  Dialysis-Denies  Sleep Study - Denies CPAP - Denies  LABS- 08/13/21 (e): COVID-Neg 08/17/21: CBC  ASA- Denies  ERAS- No  HA1C- Denies  Anesthesia- No  Pt denies having chest pain, sob, or fever during the pre-op phone call. All instructions explained to the pt, with a verbal understanding of the material including: as of today, stop taking all Aspirin (unless instructed by your doctor) and Other Aspirin containing products, Vitamins, Fish oils, and Herbal medications. Also stop all NSAIDS i.e. Advil, Ibuprofen, Motrin, Aleve, Anaprox, Naproxen, BC, Goody Powders, and all Supplements.  Pt also instructed to wear a mask and social distance after being tested for COVID-19. The opportunity to ask questions was provided.    Coronavirus Screening  Have you experienced the following symptoms:  Cough yes/no: No Fever (>100.59F)  yes/no: No Runny nose yes/no: No Sore throat yes/no: No Difficulty breathing/shortness of breath  yes/no: No  Have you or a family member traveled in the last 14 days and where? yes/no: No   If the patient indicates "YES" to the above questions, their PAT will be rescheduled to limit the exposure to others and, the surgeon will be notified. THE PATIENT WILL NEED TO BE ASYMPTOMATIC FOR 14 DAYS.   If the patient is not experiencing any of these symptoms, the PAT nurse will instruct them to NOT bring anyone with them to their appointment since they may have these symptoms or traveled as well.   Please remind your patients and families that hospital visitation restrictions are in effect and the importance of the restrictions.

## 2021-08-17 ENCOUNTER — Encounter (HOSPITAL_COMMUNITY)
Admission: RE | Disposition: A | Discharge status: Home / Self Care | Payer: Self-pay | Source: Home / Self Care | Attending: Pulmonary Disease

## 2021-08-17 ENCOUNTER — Ambulatory Visit (HOSPITAL_COMMUNITY)
Admission: RE | Admit: 2021-08-17 | Discharge: 2021-08-17 | Disposition: A | Discharge status: Home / Self Care | Payer: Medicare Other | Attending: Pulmonary Disease | Admitting: Pulmonary Disease

## 2021-08-17 ENCOUNTER — Ambulatory Visit (HOSPITAL_COMMUNITY): Payer: Medicare Other

## 2021-08-17 ENCOUNTER — Encounter (HOSPITAL_COMMUNITY): Payer: Self-pay | Admitting: Pulmonary Disease

## 2021-08-17 ENCOUNTER — Ambulatory Visit (HOSPITAL_COMMUNITY): Payer: Medicare Other | Admitting: Anesthesiology

## 2021-08-17 DIAGNOSIS — Z87891 Personal history of nicotine dependence: Secondary | ICD-10-CM | POA: Insufficient documentation

## 2021-08-17 DIAGNOSIS — R918 Other nonspecific abnormal finding of lung field: Secondary | ICD-10-CM

## 2021-08-17 DIAGNOSIS — C3432 Malignant neoplasm of lower lobe, left bronchus or lung: Secondary | ICD-10-CM | POA: Insufficient documentation

## 2021-08-17 DIAGNOSIS — E785 Hyperlipidemia, unspecified: Secondary | ICD-10-CM | POA: Diagnosis not present

## 2021-08-17 DIAGNOSIS — R911 Solitary pulmonary nodule: Secondary | ICD-10-CM | POA: Diagnosis not present

## 2021-08-17 DIAGNOSIS — Z419 Encounter for procedure for purposes other than remedying health state, unspecified: Secondary | ICD-10-CM

## 2021-08-17 DIAGNOSIS — C3492 Malignant neoplasm of unspecified part of left bronchus or lung: Secondary | ICD-10-CM

## 2021-08-17 DIAGNOSIS — Z9889 Other specified postprocedural states: Secondary | ICD-10-CM | POA: Diagnosis not present

## 2021-08-17 DIAGNOSIS — J984 Other disorders of lung: Secondary | ICD-10-CM | POA: Diagnosis not present

## 2021-08-17 DIAGNOSIS — K219 Gastro-esophageal reflux disease without esophagitis: Secondary | ICD-10-CM | POA: Diagnosis not present

## 2021-08-17 HISTORY — PX: VIDEO BRONCHOSCOPY WITH RADIAL ENDOBRONCHIAL ULTRASOUND: SHX6849

## 2021-08-17 HISTORY — PX: BRONCHIAL WASHINGS: SHX5105

## 2021-08-17 HISTORY — PX: BRONCHIAL BIOPSY: SHX5109

## 2021-08-17 HISTORY — PX: BRONCHIAL BRUSHINGS: SHX5108

## 2021-08-17 LAB — CBC
HCT: 43.3 % (ref 36.0–46.0)
Hemoglobin: 13.9 g/dL (ref 12.0–15.0)
MCH: 27.9 pg (ref 26.0–34.0)
MCHC: 32.1 g/dL (ref 30.0–36.0)
MCV: 86.8 fL (ref 80.0–100.0)
Platelets: 366 10*3/uL (ref 150–400)
RBC: 4.99 MIL/uL (ref 3.87–5.11)
RDW: 13.6 % (ref 11.5–15.5)
WBC: 6.6 10*3/uL (ref 4.0–10.5)
nRBC: 0 % (ref 0.0–0.2)

## 2021-08-17 SURGERY — BRONCHOSCOPY, WITH BIOPSY USING ELECTROMAGNETIC NAVIGATION
Anesthesia: General | Laterality: Right

## 2021-08-17 MED ORDER — ALBUTEROL SULFATE (2.5 MG/3ML) 0.083% IN NEBU: 2.5000 mg | INHALATION_SOLUTION | Freq: Four times a day (QID) | RESPIRATORY_TRACT | Status: DC | PRN

## 2021-08-17 MED ORDER — PROPOFOL 10 MG/ML IV BOLUS
INTRAVENOUS | Status: DC | PRN
  Administered 2021-08-17: 160 mg via INTRAVENOUS

## 2021-08-17 MED ORDER — IPRATROPIUM-ALBUTEROL 0.5-2.5 (3) MG/3ML IN SOLN
RESPIRATORY_TRACT | Status: AC
  Filled 2021-08-17: qty 3

## 2021-08-17 MED ORDER — SUGAMMADEX SODIUM 200 MG/2ML IV SOLN
INTRAVENOUS | Status: DC | PRN
  Administered 2021-08-17: 400 mg via INTRAVENOUS

## 2021-08-17 MED ORDER — SUCCINYLCHOLINE CHLORIDE 200 MG/10ML IV SOSY
PREFILLED_SYRINGE | INTRAVENOUS | Status: DC | PRN
  Administered 2021-08-17: 120 mg via INTRAVENOUS

## 2021-08-17 MED ORDER — FENTANYL CITRATE (PF) 100 MCG/2ML IJ SOLN
INTRAMUSCULAR | Status: DC | PRN
  Administered 2021-08-17: 100 ug via INTRAVENOUS

## 2021-08-17 MED ORDER — IPRATROPIUM-ALBUTEROL 0.5-2.5 (3) MG/3ML IN SOLN
3.0000 mL | Freq: Once | RESPIRATORY_TRACT | Status: AC
Start: 2021-08-17 — End: 2021-08-17
  Administered 2021-08-17: 3 mL via RESPIRATORY_TRACT

## 2021-08-17 MED ORDER — ONDANSETRON HCL 4 MG/2ML IJ SOLN
INTRAMUSCULAR | Status: DC | PRN
  Administered 2021-08-17: 4 mg via INTRAVENOUS

## 2021-08-17 MED ORDER — DEXAMETHASONE SODIUM PHOSPHATE 10 MG/ML IJ SOLN
INTRAMUSCULAR | Status: DC | PRN
  Administered 2021-08-17: 10 mg via INTRAVENOUS

## 2021-08-17 MED ORDER — CHLORHEXIDINE GLUCONATE 0.12 % MT SOLN
15.0000 mL | OROMUCOSAL | Status: AC
  Administered 2021-08-17: 15 mL via OROMUCOSAL
  Filled 2021-08-17: qty 15

## 2021-08-17 MED ORDER — ROCURONIUM BROMIDE 10 MG/ML (PF) SYRINGE
PREFILLED_SYRINGE | INTRAVENOUS | Status: DC | PRN
  Administered 2021-08-17: 60 mg via INTRAVENOUS

## 2021-08-17 MED ORDER — LACTATED RINGERS IV SOLN: INTRAVENOUS | Status: DC

## 2021-08-17 MED ORDER — MIDAZOLAM HCL 5 MG/5ML IJ SOLN
INTRAMUSCULAR | Status: DC | PRN
  Administered 2021-08-17: 2 mg via INTRAVENOUS

## 2021-08-17 MED ORDER — FENTANYL CITRATE (PF) 250 MCG/5ML IJ SOLN: INTRAMUSCULAR | Status: DC | PRN

## 2021-08-17 MED ORDER — CHLORHEXIDINE GLUCONATE 0.12 % MT SOLN
OROMUCOSAL | Status: AC
  Filled 2021-08-17: qty 15

## 2021-08-17 MED ORDER — PHENYLEPHRINE HCL-NACL 20-0.9 MG/250ML-% IV SOLN
INTRAVENOUS | Status: DC | PRN
  Administered 2021-08-17: 30 ug/min via INTRAVENOUS

## 2021-08-17 MED ORDER — ALBUTEROL SULFATE HFA 108 (90 BASE) MCG/ACT IN AERS
INHALATION_SPRAY | RESPIRATORY_TRACT | Status: DC | PRN
  Administered 2021-08-17: 2 via RESPIRATORY_TRACT

## 2021-08-17 NOTE — Interval H&P Note (Signed)
History and Physical Interval Note:  08/17/2021 9:47 AM  [name]  has presented today for surgery, with the diagnosis of cystic lung lesion.  The various methods of treatment have been discussed with the patient and family. After consideration of risks, benefits and other options for treatment, the patient has consented to  Procedure(s) with comments: ROBOTIC ASSISTED NAVIGATIONAL BRONCHOSCOPY (Right) - ION with CIOS as a surgical intervention.  The patient's history has been reviewed, patient examined, no change in status, stable for surgery.  I have reviewed the patient's chart and labs.  Questions were answered to the patient's satisfaction.     [place]

## 2021-08-17 NOTE — Anesthesia Preprocedure Evaluation (Addendum)
Anesthesia Evaluation  Patient identified by MRN, date of birth, ID band Patient awake    Reviewed: Allergy & Precautions, NPO status , Patient's Chart, lab work & pertinent test results  History of Anesthesia Complications Negative for: history of anesthetic complications  Airway Mallampati: I  TM Distance: >3 FB Neck ROM: Full    Dental  (+) Dental Advisory Given, Teeth Intact, Loose,    Pulmonary COPD, former smoker,  cystic lung lesion   breath sounds clear to auscultation       Cardiovascular + DOE   Rhythm:Regular     Neuro/Psych PSYCHIATRIC DISORDERS Anxiety Depression negative neurological ROS     GI/Hepatic Neg liver ROS, GERD  Medicated and Controlled,  Endo/Other    Renal/GU negative Renal ROS     Musculoskeletal  (+) Arthritis ,   Abdominal   Peds  Hematology Lab Results      Component                Value               Date                      WBC                      6.6                 08/17/2021                HGB                      13.9                08/17/2021                HCT                      43.3                08/17/2021                MCV                      86.8                08/17/2021                PLT                      366                 08/17/2021               Anesthesia Other Findings   Reproductive/Obstetrics                            Anesthesia Physical Anesthesia Plan  ASA: 3  Anesthesia Plan: General   Post-op Pain Management: Minimal or no pain anticipated   Induction: Intravenous  PONV Risk Score and Plan: 3 and Ondansetron and Dexamethasone  Airway Management Planned: Oral ETT  Additional Equipment: None  Intra-op Plan:   Post-operative Plan: Extubation in OR  Informed Consent: I have reviewed the patients History and Physical, chart, labs and discussed the procedure including the risks, benefits and alternatives for  the proposed anesthesia with the patient or  authorized representative who has indicated his/her understanding and acceptance.     Dental advisory given  Plan Discussed with: CRNA and Anesthesiologist  Anesthesia Plan Comments:         Anesthesia Quick Evaluation

## 2021-08-17 NOTE — Discharge Instructions (Addendum)
HPI  ROS  Physical Exam  Flexible Bronchoscopy, Care After This sheet gives you information about how to care for yourself after your test. Your doctor may also give you more specific instructions. If you have problems or questions, contact your doctor. Follow these instructions at home: Eating and drinking Do not eat or drink anything (not even water) for 2 hours after your test, or until your numbing medicine (local anesthetic) wears off. When your numbness is gone and your cough and gag reflexes have come back, you may: Eat only soft foods. Slowly drink liquids. The day after the test, go back to your normal diet. Driving Do not drive for 24 hours if you were given a medicine to help you relax (sedative). Do not drive or use heavy machinery while taking prescription pain medicine. General instructions  Take over-the-counter and prescription medicines only as told by your doctor. Return to your normal activities as told. Ask what activities are safe for you. Do not use any products that have nicotine or tobacco in them. This includes cigarettes and e-cigarettes. If you need help quitting, ask your doctor. Keep all follow-up visits as told by your doctor. This is important. It is very important if you had a tissue sample (biopsy) taken. Get help right away if: You have shortness of breath that gets worse. You get light-headed. You feel like you are going to pass out (faint). You have chest pain. You cough up: More than a little blood. More blood than before. Summary Do not eat or drink anything (not even water) for 2 hours after your test, or until your numbing medicine wears off. Do not use cigarettes. Do not use e-cigarettes. Get help right away if you have chest pain.  This information is not intended to replace advice given to you by your health care provider. Make sure you discuss any questions you have with your health care provider. Document Released: 06/26/2009 Document  Revised: 08/11/2017 Document Reviewed: 09/16/2016 Elsevier Patient Education  2020 [employer].

## 2021-08-17 NOTE — Anesthesia Procedure Notes (Signed)
Procedure Name: Intubation Date/Time: 08/17/2021 10:42 AM Performed by: Griffin Dakin, CRNA Pre-anesthesia Checklist: Patient identified, Emergency Drugs available, Suction available and Patient being monitored Patient Re-evaluated:Patient Re-evaluated prior to induction Oxygen Delivery Method: Circle system utilized Preoxygenation: Pre-oxygenation with 100% oxygen Induction Type: IV induction Laryngoscope Size: Glidescope and 3 Grade View: Grade I Tube type: Oral Tube size: 8.5 mm Number of attempts: 1 Airway Equipment and Method: Rigid stylet and Video-laryngoscopy Placement Confirmation: ETT inserted through vocal cords under direct vision, positive ETCO2 and breath sounds checked- equal and bilateral Secured at: 23 cm Tube secured with: Tape Dental Injury: Teeth and Oropharynx as per pre-operative assessment

## 2021-08-17 NOTE — Op Note (Signed)
Video Bronchoscopy with Robotic Assisted Bronchoscopic Navigation   Date of Operation: 08/17/2021   Pre-op Diagnosis: Lung nodule, cystic lesion   Post-op Diagnosis: Lung nodule, cystic lesion   Surgeon: [name], DO   Assistants: None   Anesthesia: General endotracheal anesthesia  Operation: Flexible video fiberoptic bronchoscopy with robotic assistance and biopsies.  Estimated Blood Loss: Minimal  Complications: None  Indications and History: [name] is a 65 y.o. female with history of right lung nodule, cystic lesion, left sided adenocarcinoma. The risks, benefits, complications, treatment options and expected outcomes were discussed with the patient.  The possibilities of pneumothorax, pneumonia, reaction to medication, pulmonary aspiration, perforation of a viscus, bleeding, failure to diagnose a condition and creating a complication requiring transfusion or operation were discussed with the patient who freely signed the consent.    Description of Procedure: The patient was seen in the Preoperative Area, was examined and was deemed appropriate to proceed.  The patient was taken to [facility] endoscopy room 3, identified as [name] and the procedure verified as Flexible Video Fiberoptic Bronchoscopy.  A Time Out was held and the above information confirmed.   Prior to the date of the procedure a high-resolution CT scan of the chest was performed. Utilizing ION software program a virtual tracheobronchial tree was generated to allow the creation of distinct navigation pathways to the patient's parenchymal abnormalities. After being taken to the operating room general anesthesia was initiated and the patient  was orally intubated. The video fiberoptic bronchoscope was introduced via the endotracheal tube and a general inspection was performed which showed normal right and left lung anatomy, aspiration of the bilateral mainstems was completed to remove any remaining secretions.  Robotic catheter inserted into patient's endotracheal tube.   Target #1 right lower lobe: The distinct navigation pathways prepared prior to this procedure were then utilized to navigate to patient's lesion identified on CT scan. The robotic catheter was secured into place and the vision probe was withdrawn.  Lesion location was approximated using fluoroscopy and radial endobronchial ultrasound for peripheral targeting. Under fluoroscopic guidance transbronchial needle brushings and transbronchial forceps biopsies were performed to be sent for cytology and pathology. A bronchioalveolar lavage was performed in the right lower lobe and sent for microbiology.  At the end of the procedure a general airway inspection was performed and there was no evidence of active bleeding. The bronchoscope was removed.  The patient tolerated the procedure well. There was no significant blood loss and there were no obvious complications. A post-procedural chest x-ray is pending.  Samples Target #1: 1. Transbronchial needle brushings from right lower lobe 2. Transbronchial forceps biopsies from right lower lobe 3. Bronchoalveolar lavage from right lower lobe  Plans:  The patient will be discharged from the PACU to home when recovered from anesthesia and after chest x-ray is reviewed. We will review the cytology, pathology and microbiology results with the patient when they become available. Outpatient followup will be with [name], DO.   [name], DO [facility] 08/17/2021 11:38 AM

## 2021-08-17 NOTE — Transfer of Care (Signed)
Immediate Anesthesia Transfer of Care Note  Patient: [name]  Procedure(s) Performed: ROBOTIC ASSISTED NAVIGATIONAL BRONCHOSCOPY (Right) BRONCHIAL BIOPSIES BRONCHIAL BRUSHINGS VIDEO BRONCHOSCOPY WITH RADIAL ENDOBRONCHIAL ULTRASOUND BRONCHIAL WASHINGS  Patient Location: PACU  Anesthesia Type:General  Level of Consciousness: awake, alert  and oriented  Airway & Oxygen Therapy: Patient Spontanous Breathing  Post-op Assessment: Report given to RN and Post -op Vital signs reviewed and stable  Post vital signs: Reviewed and stable  Last Vitals:  Vitals Value Taken Time  BP 147/74 08/17/21 1150  Temp 36.6 C 08/17/21 1150  Pulse 84 08/17/21 1151  Resp 20 08/17/21 1151  SpO2 90 % 08/17/21 1151  Vitals shown include unvalidated device data.  Last Pain:  Vitals:   08/17/21 0702  TempSrc:   PainSc: 0-No pain      Patients Stated Pain Goal: 2 (49/75/30 0511)  Complications: No notable events documented.

## 2021-08-18 ENCOUNTER — Other Ambulatory Visit: Payer: Self-pay

## 2021-08-18 DIAGNOSIS — C3432 Malignant neoplasm of lower lobe, left bronchus or lung: Secondary | ICD-10-CM

## 2021-08-18 LAB — CYTOLOGY - NON PAP

## 2021-08-18 NOTE — Anesthesia Postprocedure Evaluation (Signed)
Anesthesia Post Note  Patient: [name]  Procedure(s) Performed: ROBOTIC ASSISTED NAVIGATIONAL BRONCHOSCOPY (Right) BRONCHIAL BIOPSIES BRONCHIAL BRUSHINGS VIDEO BRONCHOSCOPY WITH RADIAL ENDOBRONCHIAL ULTRASOUND BRONCHIAL WASHINGS     Patient location during evaluation: PACU Anesthesia Type: General Level of consciousness: awake and alert Pain management: pain level controlled Vital Signs Assessment: post-procedure vital signs reviewed and stable Respiratory status: spontaneous breathing, nonlabored ventilation, respiratory function stable and patient connected to nasal cannula oxygen Cardiovascular status: blood pressure returned to baseline and stable Postop Assessment: no apparent nausea or vomiting Anesthetic complications: no   No notable events documented.  Last Vitals:  Vitals:   08/17/21 1218 08/17/21 1234  BP: (!) 168/98 (!) 160/81  Pulse: (!) 107 82  Resp: (!) 23 17  Temp:    SpO2: (!) 85% 100%    Last Pain:  Vitals:   08/17/21 1234  TempSrc:   PainSc: 0-No pain                 Yazeed Pryer

## 2021-08-19 ENCOUNTER — Encounter (HOSPITAL_COMMUNITY): Payer: Self-pay | Admitting: Pulmonary Disease

## 2021-08-19 LAB — ACID FAST SMEAR (AFB, MYCOBACTERIA): Acid Fast Smear: NEGATIVE

## 2021-08-19 LAB — CULTURE, BAL-QUANTITATIVE W GRAM STAIN
Culture: NO GROWTH
Gram Stain: NONE SEEN

## 2021-08-20 ENCOUNTER — Other Ambulatory Visit: Payer: Self-pay | Admitting: Radiology

## 2021-08-20 DIAGNOSIS — C3432 Malignant neoplasm of lower lobe, left bronchus or lung: Secondary | ICD-10-CM

## 2021-08-22 LAB — AEROBIC/ANAEROBIC CULTURE W GRAM STAIN (SURGICAL/DEEP WOUND): Culture: NORMAL

## 2021-08-23 ENCOUNTER — Encounter: Payer: Self-pay | Admitting: Acute Care

## 2021-08-23 ENCOUNTER — Other Ambulatory Visit: Payer: Self-pay

## 2021-08-23 ENCOUNTER — Ambulatory Visit (INDEPENDENT_AMBULATORY_CARE_PROVIDER_SITE_OTHER): Payer: Medicare Other | Admitting: Acute Care

## 2021-08-23 ENCOUNTER — Telehealth: Payer: Self-pay | Admitting: *Deleted

## 2021-08-23 VITALS — BP 110/62 | HR 102 | Temp 98.2°F | Ht 68.0 in | Wt 194.2 lb

## 2021-08-23 DIAGNOSIS — C3432 Malignant neoplasm of lower lobe, left bronchus or lung: Secondary | ICD-10-CM | POA: Diagnosis not present

## 2021-08-23 DIAGNOSIS — R942 Abnormal results of pulmonary function studies: Secondary | ICD-10-CM

## 2021-08-23 DIAGNOSIS — Z9889 Other specified postprocedural states: Secondary | ICD-10-CM | POA: Diagnosis not present

## 2021-08-23 DIAGNOSIS — C3490 Malignant neoplasm of unspecified part of unspecified bronchus or lung: Secondary | ICD-10-CM | POA: Diagnosis not present

## 2021-08-23 NOTE — Patient Instructions (Addendum)
It is good to see you today. Your cytology showed no malignant cells in both your brushings and your biopsy. I will speak with [name] and discuss plans for follow up imaging, and any further diagnostics.  We will call you with his recommendations.  We will base follow up on his recommendations.  Please contact office for sooner follow up if symptoms do not improve or worsen or seek emergency care

## 2021-08-23 NOTE — Progress Notes (Signed)
History of Present Illness [name] is a 65 y.o. female with  65 year old female, former smoker ( Quit 07/2020 with a 50 pack year smoking history) with past medical history of adenocarcinoma of the left lung ( SBRT) , and depression, and anxiety.  She    She initially seen by her primary care provider which encouraged her to have a lung cancer screening CT.  She had this completed in October 2021.  The CT scan revealed bilateral lower lobe cystic peripheral based lesions both greater than 4 cm in size concerning for multifocal adenocarcinoma.  She had a bronch 08/2020 which confirmed primary left lower lobe adenocarcinoma. She received 10 treatments of SBRT to the left lower lobe adenocarcinoma. She also had a right cystic appearing lesion that has been slowly growing in size. She presented for a repeat Bronchoscopy of this RLL lesion 08/17/2021 by [name].    08/23/2021 Follow Up after Flexible video fiberoptic bronchoscopy with robotic assistance and biopsies.08/17/2021 Pt. Presents for follow up after above noted bronch on 08/17/2021. She had been seen by [name]  in November 2022 after evaluating continued slow growth of a RLL cystic appearing lesion. They made a decision to re-sample the area to see if we could get a definitive tissue diagnosis. There was a brushing of RLL nodule, and a biopsy of the RLL nodule. Final microscopic diagnosis was no malignant cells in either sampling.  Pt. States she did well after the procedure. She denies any bloody secretions post  procedure. She did have a sore throat for a day or two, which has resolved. She continues to have shortness of breath with exertion which she states is her baseline. BP today in the office was 140/70. She states this is because she was so nervous about getting the results of her biopsies. She denies any fever, chest pain, orthopnea or hemoptysis.    Test Results: Tissue Brushings / Biopsies 08/17/2021 FINAL MICROSCOPIC  DIAGNOSIS:  A. LUNG, RLL, BRUSHING:  - No malignant cells identified   B. LUNG, RLL, BIOPSIES:  - No malignant cells identified   Super D Chest 08/09/2021 Persistent consolidation with some increase in density of peripheral LEFT lower lobe process. Differential would include round atelectasis versus neoplasm. Consider FDG PET scan for further characterization Stable cavitary lesion in the RIGHT lower lobe  PFT 07/2020            Moderate obstructive defect present Bronchodilator response: no significant bronchodilator response DLCO: moderately reduced LUNG VOLUMES: Air trapping present  CBC Latest Ref Rng & Units 08/17/2021 12/17/2020 11/04/2020  WBC 4.0 - 10.5 K/uL 6.6 7.6 -  Hemoglobin 12.0 - 15.0 g/dL 13.9 12.2 14.3  Hematocrit 36.0 - 46.0 % 43.3 38.7 42.0  Platelets 150 - 400 K/uL 366 337 -    BMP Latest Ref Rng & Units 12/17/2020 11/04/2020 09/19/2020  Glucose 70 - 99 mg/dL 90 86 117(H)  BUN 8 - 23 mg/dL 15 24(H) 17  Creatinine 0.44 - 1.00 mg/dL 1.06(H) 1.00 0.95  BUN/Creat Ratio 12 - 28 - - -  Sodium 135 - 145 mmol/L 138 145 138  Potassium 3.5 - 5.1 mmol/L 3.8 4.3 4.3  Chloride 98 - 111 mmol/L 104 107 103  CO2 22 - 32 mmol/L 26 - 26  Calcium 8.9 - 10.3 mg/dL 9.0 - 9.4    BNP    Component Value Date/Time   BNP 66.1 12/17/2020 1029    ProBNP No results found for: PROBNP  PFT  Component Value Date/Time   FEV1PRE 1.37 08/03/2020 0911   FEV1POST 1.22 08/03/2020 0911   FVCPRE 2.03 08/03/2020 0911   FVCPOST 2.15 08/03/2020 0911   TLC 5.13 08/03/2020 0911   DLCOUNC 9.46 08/03/2020 0911   PREFEV1FVCRT 67 08/03/2020 0911   PSTFEV1FVCRT 57 08/03/2020 0911    DG CHEST PORT 1 VIEW  Result Date: 08/17/2021 CLINICAL DATA:  Status post bronchoscopy EXAM: PORTABLE CHEST 1 VIEW COMPARISON:  12/17/2020, CT chest, 08/09/2021 FINDINGS: The heart size and mediastinal contours are within normal limits. Redemonstrated cystic lesion of the right lung base, subpleural  mass of the superior segment left lower lobe is not well appreciated radiographically. No pneumothorax. The visualized skeletal structures are unremarkable. IMPRESSION: 1.  Redemonstrated cystic lesion of the right lung base. 2. Subpleural mass of the superior segment left lower lobe is not well appreciated radiographically. 3.  No pneumothorax. Electronically Signed   By: Delanna Ahmadi M.D.   On: 08/17/2021 12:17   CT Super D Chest Wo Contrast  Result Date: 08/10/2021 CLINICAL DATA:  Lung mass.  Smoking history EXAM: CT CHEST WITHOUT CONTRAST TECHNIQUE: Multidetector CT imaging of the chest was performed using thin slice collimation for electromagnetic bronchoscopy planning purposes, without intravenous contrast. COMPARISON:  None. FINDINGS: Cardiovascular: No significant vascular findings. Normal heart size. No pericardial effusion. Mediastinum/Nodes: No axillary or supraclavicular adenopathy. No mediastinal or hilar adenopathy. No pericardial fluid. Esophagus normal. Lungs/Pleura: Large cavitary lesion in the RIGHT lower lobe with relatively thin margin however there is intervening nodularity along the cavity wall. Cavitary lesion measures 7.6 by 3.9 cm (image 97/6) unchanged from 7.5 by 4.0 cm. In the LEFT lower lobe there is a peripheral consolidation along the pleural surface measuring 3.5 x 1.7 cm compared by 4.2 x 1 4 cm. In sagittal plane the lesion appears more solid (image 126/8). Small nodule in the RIGHT oblique fissure unchanged. Upper Abdomen: Limited view of the liver, kidneys, pancreas are unremarkable. Normal adrenal glands. Low-attenuation large mint of the RIGHT adrenal gland consistent benign adenoma. Musculoskeletal: No aggressive osseous lesion IMPRESSION: 1. Persistent consolidation with some increase in density of peripheral LEFT lower lobe process. Differential would include round atelectasis versus neoplasm. Consider FDG PET scan for further characterization 2. Stable cavitary lesion  in the RIGHT lower lobe. Electronically Signed   By: Suzy Bouchard M.D.   On: 08/10/2021 09:13   DG C-ARM BRONCHOSCOPY  Result Date: 08/17/2021 C-ARM BRONCHOSCOPY: Fluoroscopy was utilized by the requesting physician.  No radiographic interpretation.     Past medical hx Past Medical History:  Diagnosis Date   Abnormal CT lung screening 07/10/2020   Anxiety    Arthritis    Bladder cancer ([place]) 09/2020   urologist--- dr Tresa Moore, incidental finding on PET scan 12/ 2021,  s/p TURBT 09-18-2020 high grade papillary urothelial    Centrilobular emphysema ([place])    Close exposure to COVID-19 virus 05/03/2019   Constipation 04/09/2019   DDD (degenerative disc disease), lumbar    Depression    DOE (dyspnea on exertion)    10-29-2020  per pt can do house work without sob, when walks/ stairs stops frequently  (stated recovers quickly when stops)   Epidermoid cyst 10/29/2018   Recurrent. Located on left hip at iliac crest 2" anterior to midaxillary line. Removed 2013, 2020, 2022.    Epigastric pain 11/16/2020   Fibroid 2003   GERD (gastroesophageal reflux disease)    History of radiation therapy    Left lung- 10/06/20-10/16/20- Dr. Gery Pray  Leg cramping 11/13/2019   Menopausal vaginal dryness 11/13/2019   Primary adenocarcinoma of lower lobe of left lung ([place]) 07/2020   oncologist--- dr Julien Nordmann---  dx 11/ 2021 bilateral lower lobe masses,  s/p bronchoscopy w/ bx's 07-28-2020 malignancy cells on left ;  completed SBRT bilateral lower lung 10-16-2020 5 fractions   Radiation-induced dermatitis    on 10-29-2020 pt complaint stomach and chest areas,  completed SBRT 10-16-2020   Seasonal allergies    Stage 3 severe COPD by GOLD classification [facility])    pulmonologist--- dr Valeta Harms,  using anoro inhaler daily   Wears glasses      Social History   Tobacco Use   Smoking status: Former    Packs/day: 1.00    Years: 50.00    Pack years: 50.00    Types: Cigarettes    Start date: 04/13/1975     Quit date: 07/14/2020    Years since quitting: 1.1   Smokeless tobacco: Never  Vaping Use   Vaping Use: Never used  Substance Use Topics   Alcohol use: Yes    Comment: Holidays only   Drug use: Never    Ms.Hiebert reports that she quit smoking about 13 months ago. Her smoking use included cigarettes. She started smoking about 46 years ago. She has a 50.00 pack-year smoking history. She has never used smokeless tobacco. She reports current alcohol use. She reports that she does not use drugs.  Tobacco Cessation: Former smoker quit 07/2020 with a 50 pack year smoking history   Past surgical hx, Family hx, Social hx all reviewed.  Current Outpatient Medications on File Prior to Visit  Medication Sig   acetaminophen (TYLENOL) 650 MG CR tablet Take 650-1,300 mg by mouth every 8 (eight) hours as needed for pain.   albuterol (VENTOLIN HFA) 108 (90 Base) MCG/ACT inhaler USE 2 INHALATIONS BY MOUTH  INTO THE LUNGS EVERY 6  HOURS AS NEEDED FOR  SHORTNESS OF BREATH OR  WHEEZING   atorvastatin (LIPITOR) 40 MG tablet Take 1 tablet (40 mg total) by mouth daily. (Patient taking differently: Take 40 mg by mouth at bedtime.)   azelastine (OPTIVAR) 0.05 % ophthalmic solution Place 1 drop into both eyes 2 (two) times daily as needed for allergies.   baclofen (LIORESAL) 10 MG tablet Take 0.5 tablets (5 mg total) by mouth 3 (three) times daily. (Patient taking differently: Take 5 mg by mouth 3 (three) times daily as needed for muscle spasms.)   carbamide peroxide (DEBROX) 6.5 % OTIC solution Place 5 drops into both ears 2 (two) times daily. (Patient taking differently: Place 5 drops into both ears 2 (two) times daily as needed (ear wax build up).)   chlorhexidine (PERIDEX) 0.12 % solution Use as directed 15 mLs in the mouth or throat in the morning.   clobetasol cream (TEMOVATE) 4.13 % Apply 1 application topically 2 (two) times daily as needed. (Patient taking differently: Apply 1 application topically 2 (two)  times daily as needed (psoriasis).)   diclofenac Sodium (VOLTAREN) 1 % GEL Apply 2 g topically 4 (four) times daily. Apply to painful area of ribs. Need to use regularly for relief. (Patient taking differently: Apply 2 g topically 4 (four) times daily as needed (pain.). Apply to painful area of ribs. Need to use regularly for relief.)   fluticasone (FLONASE) 50 MCG/ACT nasal spray Place 2 sprays into both nostrils daily. (Patient taking differently: Place 2 sprays into both nostrils daily as needed (allergies).)   gabapentin (NEURONTIN) 300 MG capsule Take  1 capsule (300 mg total) by mouth at bedtime. After 4 days increase to 2 capsules (600 mg total) by mouth at bedtime. (Patient taking differently: Take 600 mg by mouth at bedtime as needed (cramps).)   hydrOXYzine (ATARAX/VISTARIL) 50 MG tablet Take 1 tablet (50 mg total) by mouth at bedtime as needed for anxiety or itching (sleep). (Patient taking differently: Take 50 mg by mouth daily as needed for itching.)   MELATONIN GUMMIES PO Take 10 mg by mouth at bedtime.   pantoprazole (PROTONIX) 40 MG tablet Take 1 tablet (40 mg total) by mouth daily. (Patient taking differently: Take 40 mg by mouth daily as needed (acid indigestion/heartburn.).)   traMADol (ULTRAM) 50 MG tablet Take 1 tablet (50 mg total) by mouth every 6 (six) hours as needed for moderate pain or severe pain.   No current facility-administered medications on file prior to visit.     Allergies  Allergen Reactions   Codeine Nausea And Vomiting and Other (See Comments)    Hallucinations   Prednisone Other (See Comments)    'Made me feel funny inside my head"-pt cannot be specific    Review Of Systems:  Constitutional:   No  weight loss, night sweats,  Fevers, chills, fatigue, or  lassitude.  HEENT:   No headaches,  Difficulty swallowing,  Tooth/dental problems, or  Sore throat,                No sneezing, itching, ear ache, nasal congestion, post nasal drip,   CV:  No chest  pain,  Orthopnea, PND, swelling in lower extremities, anasarca, dizziness, palpitations, syncope.   GI  No heartburn, indigestion, abdominal pain, nausea, vomiting, diarrhea, change in bowel habits, loss of appetite, bloody stools.   Resp: + baseline  shortness of breath with exertion less at rest.  No excess mucus, no productive cough,  No non-productive cough,  No coughing up of blood.  No change in color of mucus.  No wheezing.  No chest wall deformity  Skin: no rash or lesions.  GU: no dysuria, change in color of urine, no urgency or frequency.  No flank pain, no hematuria   MS:  No joint pain or swelling.  No decreased range of motion.  No back pain.  Psych:  No change in mood or affect. No depression or anxiety.  No memory loss.   Vital Signs BP 110/62 (BP Location: Left Arm, Patient Position: Sitting, Cuff Size: Normal)   Pulse (!) 102   Temp 98.2 F (36.8 C) (Oral)   Ht 5\' 8"  (1.727 m)   Wt 194 lb 3.2 oz (88.1 kg)   SpO2 97%   BMI 29.53 kg/m    Physical Exam:  General- No distress,  A&Ox3, pleasant ENT: No sinus tenderness, TM clear, pale nasal mucosa, no oral exudate,no post nasal drip, no LAN Cardiac: S1, S2, regular rate and rhythm, no murmur Chest: No wheeze/ rales/ dullness; no accessory muscle use, no nasal flaring, no sternal retractions Abd.: Soft Non-tender, ND, BS +, Body mass index is 29.53 kg/m.  Ext: No clubbing cyanosis, edema, no obvious deformities Neuro:  normal strength, MAE x 4, A&O x 3 Skin: No rashes, warm and dry, no lesions  Psych: normal mood and behavior   Assessment/Plan Post Procedure Flexible video fiberoptic bronchoscopy with robotic assistance and biopsies. No complications during procedure Biopsy and brushings negative for malignant cells Plan Your cytology showed no malignant cells in both your brushings and your biopsy. I spoke with [name]  and he recommends a 6 month follow up CT Chest with appointment with him after CT Plan  is to continue to monitor this area as it has showed some continued growth There is concern this is another adenocarcinoma  Consider SBRT as an option in the future if there is continued slow growth of this area.   Moderate Obstruction per PFT's Plan Albuterol prn as you have been doing for shortness of breath or wheezing Consider Maintenance at follow up  I spent 40 minutes dedicated to the care of this patient on the date of this encounter to include pre-visit review of records, face-to-face time with the patient discussing conditions above, post visit ordering of testing, clinical documentation with the electronic health record, making appropriate referrals as documented, and communicating necessary information to the patient's healthcare team.   Magdalen Spatz, NP 08/23/2021  12:08 PM

## 2021-08-23 NOTE — Telephone Encounter (Signed)
Called patient to inform of appt. with Dr. Cherlyn Roberts on Dec. 16 - arrival time- 2 pm -address- [address], [place], ph. no. [phone], spoke with patient and she is aware of this appt.

## 2021-08-24 ENCOUNTER — Encounter: Payer: Self-pay | Admitting: Acute Care

## 2021-08-24 ENCOUNTER — Telehealth: Payer: Self-pay | Admitting: Acute Care

## 2021-08-24 DIAGNOSIS — R918 Other nonspecific abnormal finding of lung field: Secondary | ICD-10-CM

## 2021-08-24 NOTE — Telephone Encounter (Signed)
Please call patient. Let her know I spoke with [name] last night. He feels a 6 month follow up CT Chest, then follow up with him after CT Chest if best plan.  Please place order for 6 month follow up chest, and schedule patient to see Icard after scan. Thanks so much

## 2021-08-24 NOTE — Telephone Encounter (Signed)
Call made to patient, confirmed DOB. Made aware of recommendations.   Order placed, recall placed.   Nothing further needed at this time.

## 2021-08-24 NOTE — Addendum Note (Signed)
Addended by: Vivia Ewing on: 08/24/2021 10:15 AM   Modules accepted: Orders

## 2021-08-26 DIAGNOSIS — C3490 Malignant neoplasm of unspecified part of unspecified bronchus or lung: Secondary | ICD-10-CM | POA: Diagnosis not present

## 2021-08-27 DIAGNOSIS — L723 Sebaceous cyst: Secondary | ICD-10-CM | POA: Diagnosis not present

## 2021-08-27 DIAGNOSIS — J439 Emphysema, unspecified: Secondary | ICD-10-CM | POA: Diagnosis not present

## 2021-09-07 DIAGNOSIS — C3490 Malignant neoplasm of unspecified part of unspecified bronchus or lung: Secondary | ICD-10-CM | POA: Diagnosis not present

## 2021-09-09 ENCOUNTER — Ambulatory Visit: Payer: Medicare Other | Admitting: Pulmonary Disease

## 2021-09-16 ENCOUNTER — Ambulatory Visit (INDEPENDENT_AMBULATORY_CARE_PROVIDER_SITE_OTHER): Payer: 59 | Admitting: Family Medicine

## 2021-09-16 ENCOUNTER — Other Ambulatory Visit: Payer: Self-pay

## 2021-09-16 ENCOUNTER — Encounter: Payer: Self-pay | Admitting: Family Medicine

## 2021-09-16 VITALS — BP 134/78 | HR 71 | Ht 68.0 in | Wt 193.6 lb

## 2021-09-16 DIAGNOSIS — F411 Generalized anxiety disorder: Secondary | ICD-10-CM | POA: Diagnosis not present

## 2021-09-16 DIAGNOSIS — H60503 Unspecified acute noninfective otitis externa, bilateral: Secondary | ICD-10-CM

## 2021-09-16 LAB — FUNGUS CULTURE WITH STAIN

## 2021-09-16 LAB — FUNGUS CULTURE RESULT

## 2021-09-16 LAB — FUNGAL ORGANISM REFLEX

## 2021-09-16 MED ORDER — CIPROFLOXACIN-DEXAMETHASONE 0.3-0.1 % OT SUSP: 4.0000 [drp] | Freq: Two times a day (BID) | OTIC | 0 refills | Status: DC

## 2021-09-16 MED ORDER — HYDROXYZINE HCL 50 MG PO TABS: 50.0000 mg | ORAL_TABLET | Freq: Every evening | ORAL | 1 refills | Status: DC | PRN

## 2021-09-16 NOTE — Patient Instructions (Addendum)
Thank you for coming to see me today. It was a pleasure.   You have an ear infection in both ears.  Use Cipro Dex 4 drops to each ear twice a day for 5 days  If symptoms worsen please return to clinic or go to Urgent care  Please follow-up with PCP as needed   If you have any questions or concerns, please do not hesitate to call the office at [phone].  Best,   Carollee Leitz, MD    Otitis Externa Otitis externa is an infection of the outer ear canal. The outer ear canal is the area between the outside of the ear and the eardrum. Otitis externa is sometimes called swimmer's ear. What are the causes? Common causes of this condition include: Swimming in dirty water. Moisture in the ear. An injury to the inside of the ear. An object stuck in the ear. A cut or scrape on the outside of the ear or in the ear canal. What increases the risk? You are more likely to get this condition if you go swimming often. What are the signs or symptoms? Itching in the ear. This is often the first symptom. Swelling of the ear. Redness in the ear. Ear pain. The pain may get worse when you pull on your ear. Pus coming from the ear. How is this treated? This condition may be treated with: Antibiotic ear drops. These are often given for 10-14 days. Medicines to reduce itching and swelling. Follow these instructions at home: If you were prescribed antibiotic ear drops, use them as told by your doctor. Do not stop using them even if you start to feel better. Take over-the-counter and prescription medicines only as told by your doctor. Avoid getting water in your ears as told by your doctor. You may be told to avoid swimming or water sports for a few days. Keep all follow-up visits. How is this prevented? Keep your ears dry. Use the corner of a towel to dry your ears after you swim or bathe. Try not to scratch or put things in your ear. Doing these things makes it easier for germs to grow in your  ear. Avoid swimming in lakes, dirty water, or swimming pools that may not have the right amount of a chemical called chlorine. Contact a doctor if: You have a fever. Your ear is still red, swollen, or painful after 3 days. You still have pus coming from your ear after 3 days. Your redness, swelling, or pain gets worse. You have a very bad headache. Get help right away if: You have redness, swelling, and pain or tenderness behind your ear. Summary Otitis externa is an infection of the outer ear canal. Symptoms include pain, redness, and swelling of the ear. If you were prescribed antibiotic ear drops, use them as told by your doctor. Do not stop using them even if you start to feel better. Try not to scratch or put things in your ear. This information is not intended to replace advice given to you by your health care provider. Make sure you discuss any questions you have with your health care provider. Document Revised: 11/11/2020 Document Reviewed: 11/11/2020 Elsevier Patient Education  [employer].

## 2021-09-16 NOTE — Progress Notes (Signed)
° ° °  SUBJECTIVE:   CHIEF COMPLAINT / HPI:  left ear pain   Reports left side ear pain, neck pain and headache since New Year.  Denies any fever, dizziness, visual changes, nasal congestion, slurred speech, ear discharge, decreased hearing, nasal congestion, cough, gait or balance concerns.   Tylenol has not helped.  Does not affect ADLs or driving.  PERTINENT  PMH / PSH:  Adenocarcinoma of lung, completed treatment  OBJECTIVE:   BP 134/78    Pulse 71    Ht 5\' 8"  (1.727 m)    Wt 193 lb 9.6 oz (87.8 kg)    SpO2 99%    BMI 29.44 kg/m    General: Alert, no acute distress HEENT: TM's visible bilaterally, mild erythema surrounding canals, no discharge appreciated. Mild Lt TMJ crepitus noted  Neck: No erythema, edema, ecchymosis noted. Cervical point tenderness absent.  No spasm. ROM intact Neuro: CN II: PERRL CN III, IV,VI: EOMI CV V: Normal sensation in V1, V2, V3 CVII: Symmetric smile and brow raise CN VIII: Normal hearing CN IX,X: Symmetric palate raise  CN XI: 5/5 shoulder shrug CN XII: Symmetric tongue protrusion  UE and LE strength 5/5 2+ UE and LE reflexes  Normal sensation in UE and LE bilaterally  No ataxia with finger to nose, normal heel to shin  Negative Rhomberg     ASSESSMENT/PLAN:   Otitis externa Low suspicion for CVA, Brain bleed given no focal deficits on exam, -Cipro-Dex 4 gtts each ear BID x 5 days -Avoid q-tips in ears -Follow up with PCP if no improvement -Strict return precautions provided   Anxiety state Refill Atarax 50 mg daily    Declined to complete PHQ9 at this visit  Carollee Leitz, MD [place]

## 2021-09-18 ENCOUNTER — Ambulatory Visit: Payer: Self-pay | Admitting: General Surgery

## 2021-09-22 ENCOUNTER — Encounter: Payer: Self-pay | Admitting: Family Medicine

## 2021-09-22 ENCOUNTER — Other Ambulatory Visit: Payer: Self-pay | Admitting: Family Medicine

## 2021-09-22 DIAGNOSIS — H6090 Unspecified otitis externa, unspecified ear: Secondary | ICD-10-CM | POA: Insufficient documentation

## 2021-09-22 DIAGNOSIS — E785 Hyperlipidemia, unspecified: Secondary | ICD-10-CM

## 2021-09-22 NOTE — Assessment & Plan Note (Signed)
Refill Atarax 50 mg daily

## 2021-09-22 NOTE — Assessment & Plan Note (Signed)
Low suspicion for CVA, Brain bleed given no focal deficits on exam, -Cipro-Dex 4 gtts each ear BID x 5 days -Avoid q-tips in ears -Follow up with PCP if no improvement -Strict return precautions provided

## 2021-09-23 ENCOUNTER — Other Ambulatory Visit: Payer: Self-pay | Admitting: Family Medicine

## 2021-09-23 ENCOUNTER — Other Ambulatory Visit: Payer: Self-pay

## 2021-09-23 ENCOUNTER — Other Ambulatory Visit: Payer: Medicaid Other

## 2021-09-23 DIAGNOSIS — E785 Hyperlipidemia, unspecified: Secondary | ICD-10-CM

## 2021-09-24 ENCOUNTER — Telehealth: Payer: Self-pay | Admitting: Family Medicine

## 2021-09-24 DIAGNOSIS — H9203 Otalgia, bilateral: Secondary | ICD-10-CM

## 2021-09-24 DIAGNOSIS — E785 Hyperlipidemia, unspecified: Secondary | ICD-10-CM

## 2021-09-24 LAB — LIPID PANEL
Chol/HDL Ratio: 4.3 ratio (ref 0.0–4.4)
Cholesterol, Total: 200 mg/dL — ABNORMAL HIGH (ref 100–199)
HDL: 47 mg/dL (ref >39)
LDL Chol Calc (NIH): 116 mg/dL — ABNORMAL HIGH (ref 0–99)
Triglycerides: 214 mg/dL — ABNORMAL HIGH (ref 0–149)
VLDL Cholesterol Cal: 37 mg/dL (ref 5–40)

## 2021-09-24 NOTE — Telephone Encounter (Signed)
Error, duplicate entry.  [name], MD

## 2021-09-24 NOTE — Telephone Encounter (Signed)
Called patient regarding cholesterol lab results. LDL over goal of <100. [name] is currently prescribed atorvastatin 40 mg daily. She relays that she has not been taking it daily, as other medications of hers make her sleepy and she forgets. Discussed possibly reaching out to her pharmacy for pill packs to help her, but she would simply lke to start taking it every day.   Will recheck direct LDL in 1-2 months.   Additionally, she reports her ears and neck still hurting. She was seen on 1/05 and diagnosed with bilateral acute otitis externa and rx cipro-dex BID x5 days. Scheduled for ATC clinic Monday afternoon 2:50 pm. Urgent care precautions discussed.   [name], MD

## 2021-09-26 NOTE — Patient Instructions (Incomplete)
It was wonderful to see you today.  Please bring ALL of your medications with you to every visit.   Today we talked about:  **   Thank you for choosing [facility].   Please call [phone] with any questions about today's appointment.  Please be sure to schedule follow up at the front  desk before you leave today.   Sharion Settler, DO PGY-2 Family Medicine

## 2021-09-26 NOTE — Progress Notes (Deleted)
° ° °  SUBJECTIVE:   CHIEF COMPLAINT / HPI:   Neck and Ear Pain  [name] is a 66 y.o. female who presents to the [facility] clinic to discuss continued neck and left ear pain. She was seen in clinic on 1/5 and diagnosed with otitis externa and prescribed a 5-day course of cipro-dex otic drops. Patient reports ***.   PERTINENT  PMH / PSH: anxiety, fatigue, osteopenia, chronic hand pain and chronic low back pain   OBJECTIVE:   There were no vitals taken for this visit. ***  General: NAD, pleasant, able to participate in exam Cardiac: RRR, no murmurs. Respiratory: CTAB, normal effort, No wheezes, rales or rhonchi Abdomen: Bowel sounds present, nontender, nondistended, no hepatosplenomegaly. Extremities: no edema or cyanosis. Skin: warm and dry, no rashes noted Neuro: alert, no obvious focal deficits Psych: Normal affect and mood  ASSESSMENT/PLAN:   No problem-specific Assessment & Plan notes found for this encounter.     Sharion Settler, [place]

## 2021-09-27 ENCOUNTER — Ambulatory Visit: Payer: 59

## 2021-09-29 ENCOUNTER — Ambulatory Visit (INDEPENDENT_AMBULATORY_CARE_PROVIDER_SITE_OTHER): Payer: 59 | Admitting: Family Medicine

## 2021-09-29 ENCOUNTER — Other Ambulatory Visit: Payer: Self-pay

## 2021-09-29 DIAGNOSIS — R238 Other skin changes: Secondary | ICD-10-CM | POA: Diagnosis not present

## 2021-09-29 DIAGNOSIS — H60503 Unspecified acute noninfective otitis externa, bilateral: Secondary | ICD-10-CM | POA: Diagnosis not present

## 2021-09-29 NOTE — Patient Instructions (Signed)
Thank you for coming to see me today. It was a pleasure. Today we discussed your ear infection. Your ears look great. I recommend stopping the ear drops. The lesion on your nose is a spot. You can apply gentle warm compresses to allow the pus to come out.  Please follow-up with PCP as needed.   If you have any questions or concerns, please do not hesitate to call the office at [phone].  Best wishes,   Dr Posey Pronto

## 2021-09-29 NOTE — Progress Notes (Signed)
° ° ° °  SUBJECTIVE:   CHIEF COMPLAINT / HPI:   [name] is a 66 y.o. female presents for ear pain  Ear pain Started on ciprodex for otitis externa 1 week ago. Her main symptoms were headache and left sided neck pain. Currently asymptomatic. Pt complaint with medication for ear infecton. Since then pt reports improvement in sx. Denies ear drainage, ear pain, erythema, fevers etc.   Spot over left nose Noticed a small spot over left side of her nose 2 days ago. Appears erythematous but denies drainage.   [place] Office Visit from 09/29/2021 in [place]  PHQ-9 Total Score 0         PERTINENT  PMH / PSH: lung ca, oral herpes, emphysema   OBJECTIVE:   BP 115/73    Pulse 82    Ht 5\' 8"  (1.727 m)    Wt 193 lb 12.8 oz (87.9 kg)    SpO2 95%    BMI 29.47 kg/m    General: Alert, no acute distress, well appearing HEENT: NCAT, no cervical lymphadenopathy, no pharyngeal edema, erythema etc, Tms normal bilaterally, pimple over bridge of left nose Cardio: well perfused Pulm: normal work of breathing\ Neuro: Cranial nerves grossly intact   ASSESSMENT/PLAN:   Skin pimple Skin pimple on nose likely due frequent mask use. Reassured pt that it is not of concern and that she can keep the area clean and also apply warm compresses to the area to allow the pus to come to the surface.  Otitis externa Normal ear exam. Reassured pt. Recommended stopping ear drops now that infection has resolved. Follow up as needed.    Lattie Haw, MD PGY-3 [place]

## 2021-10-04 DIAGNOSIS — R238 Other skin changes: Secondary | ICD-10-CM | POA: Insufficient documentation

## 2021-10-04 NOTE — Assessment & Plan Note (Signed)
Normal ear exam. Reassured pt. Recommended stopping ear drops now that infection has resolved. Follow up as needed.

## 2021-10-04 NOTE — Assessment & Plan Note (Signed)
Skin pimple on nose likely due frequent mask use. Reassured pt that it is not of concern and that she can keep the area clean and also apply warm compresses to the area to allow the pus to come to the surface.

## 2021-10-07 ENCOUNTER — Encounter (HOSPITAL_COMMUNITY): Payer: Self-pay

## 2021-10-07 LAB — ACID FAST CULTURE WITH REFLEXED SENSITIVITIES (MYCOBACTERIA): Acid Fast Culture: NEGATIVE

## 2021-10-08 ENCOUNTER — Other Ambulatory Visit: Payer: Self-pay

## 2021-10-08 ENCOUNTER — Encounter (HOSPITAL_BASED_OUTPATIENT_CLINIC_OR_DEPARTMENT_OTHER): Payer: Self-pay | Admitting: General Surgery

## 2021-10-08 NOTE — Progress Notes (Signed)
Spoke w/ via phone for pre-op interview---patient Lab needs dos----NA               Lab results------EKG 04/21/21 COVID test -----patient states asymptomatic no test needed. Arrive at -------07:30am 10/14/21 NPO after MN NO Solid Food.  Clear liquids from MN until---06:30 am Med rec completed. Medications to take morning of surgery -----Tramadol and Albuterol PRN Diabetic medication -----NA Patient instructed no nail polish to be worn day of surgery. Patient instructed to bring photo id and insurance card day of surgery. Patient aware to have Driver (ride ) / caregiver for 24 hours after surgery. Patient Special Instructions -----NA Pre-Op special Istructions -----NA Patient verbalized understanding of instructions that were given at this phone interview. Patient denies shortness of breath, chest pain, fever, cough at this phone interview.   Chart reviewed with Dr. Roanna Banning, anesthesiologist, including recent PFT and pulmonology visit on 08/23/21. Okay to proceed with surgery as planned per aforesaid.

## 2021-10-14 ENCOUNTER — Encounter (HOSPITAL_BASED_OUTPATIENT_CLINIC_OR_DEPARTMENT_OTHER)
Admission: RE | Disposition: A | Discharge status: Home / Self Care | Payer: Self-pay | Source: Home / Self Care | Attending: General Surgery

## 2021-10-14 ENCOUNTER — Encounter (HOSPITAL_BASED_OUTPATIENT_CLINIC_OR_DEPARTMENT_OTHER): Payer: Self-pay | Admitting: General Surgery

## 2021-10-14 ENCOUNTER — Other Ambulatory Visit: Payer: Self-pay

## 2021-10-14 ENCOUNTER — Ambulatory Visit (HOSPITAL_BASED_OUTPATIENT_CLINIC_OR_DEPARTMENT_OTHER): Payer: 59 | Admitting: Anesthesiology

## 2021-10-14 ENCOUNTER — Ambulatory Visit (HOSPITAL_BASED_OUTPATIENT_CLINIC_OR_DEPARTMENT_OTHER)
Admission: RE | Admit: 2021-10-14 | Discharge: 2021-10-14 | Disposition: A | Discharge status: Home / Self Care | Payer: 59 | Attending: General Surgery | Admitting: General Surgery

## 2021-10-14 DIAGNOSIS — Z87891 Personal history of nicotine dependence: Secondary | ICD-10-CM | POA: Insufficient documentation

## 2021-10-14 DIAGNOSIS — F32A Depression, unspecified: Secondary | ICD-10-CM | POA: Diagnosis not present

## 2021-10-14 DIAGNOSIS — F419 Anxiety disorder, unspecified: Secondary | ICD-10-CM | POA: Diagnosis not present

## 2021-10-14 DIAGNOSIS — J449 Chronic obstructive pulmonary disease, unspecified: Secondary | ICD-10-CM | POA: Diagnosis not present

## 2021-10-14 DIAGNOSIS — R222 Localized swelling, mass and lump, trunk: Secondary | ICD-10-CM | POA: Diagnosis present

## 2021-10-14 DIAGNOSIS — K219 Gastro-esophageal reflux disease without esophagitis: Secondary | ICD-10-CM | POA: Diagnosis not present

## 2021-10-14 DIAGNOSIS — D171 Benign lipomatous neoplasm of skin and subcutaneous tissue of trunk: Secondary | ICD-10-CM | POA: Diagnosis not present

## 2021-10-14 HISTORY — DX: Emphysema, unspecified: J43.9

## 2021-10-14 HISTORY — PX: MASS EXCISION: SHX2000

## 2021-10-14 SURGERY — EXCISION MASS
Anesthesia: General | Laterality: Left

## 2021-10-14 MED ORDER — 0.9 % SODIUM CHLORIDE (POUR BTL) OPTIME
TOPICAL | Status: DC | PRN
Start: 2021-10-14 — End: 2021-10-14
  Administered 2021-10-14: 500 mL

## 2021-10-14 MED ORDER — ONDANSETRON HCL 4 MG/2ML IJ SOLN
INTRAMUSCULAR | Status: DC | PRN
  Administered 2021-10-14: 4 mg via INTRAVENOUS

## 2021-10-14 MED ORDER — IBUPROFEN 800 MG PO TABS: 800.0000 mg | ORAL_TABLET | Freq: Three times a day (TID) | ORAL | 0 refills | Status: DC | PRN

## 2021-10-14 MED ORDER — SUGAMMADEX SODIUM 200 MG/2ML IV SOLN
INTRAVENOUS | Status: DC | PRN
  Administered 2021-10-14: 200 mg via INTRAVENOUS

## 2021-10-14 MED ORDER — CELECOXIB 200 MG PO CAPS
400.0000 mg | ORAL_CAPSULE | ORAL | Status: AC
  Administered 2021-10-14: 400 mg via ORAL

## 2021-10-14 MED ORDER — AMISULPRIDE (ANTIEMETIC) 5 MG/2ML IV SOLN: 10.0000 mg | Freq: Once | INTRAVENOUS | Status: DC | PRN

## 2021-10-14 MED ORDER — RACEPINEPHRINE HCL 2.25 % IN NEBU
0.5000 mL | INHALATION_SOLUTION | Freq: Once | RESPIRATORY_TRACT | Status: AC
  Administered 2021-10-14: 0.5 mL via RESPIRATORY_TRACT
  Filled 2021-10-14 (×2): qty 0.5

## 2021-10-14 MED ORDER — ONDANSETRON HCL 4 MG/2ML IJ SOLN: 4.0000 mg | Freq: Once | INTRAMUSCULAR | Status: DC | PRN

## 2021-10-14 MED ORDER — ACETAMINOPHEN 500 MG PO TABS
1000.0000 mg | ORAL_TABLET | ORAL | Status: AC
  Administered 2021-10-14: 1000 mg via ORAL

## 2021-10-14 MED ORDER — CEFAZOLIN SODIUM-DEXTROSE 2-4 GM/100ML-% IV SOLN
INTRAVENOUS | Status: AC
  Filled 2021-10-14: qty 100

## 2021-10-14 MED ORDER — PHENYLEPHRINE 40 MCG/ML (10ML) SYRINGE FOR IV PUSH (FOR BLOOD PRESSURE SUPPORT)
PREFILLED_SYRINGE | INTRAVENOUS | Status: AC
  Filled 2021-10-14: qty 10

## 2021-10-14 MED ORDER — LACTATED RINGERS IV SOLN: INTRAVENOUS | Status: DC

## 2021-10-14 MED ORDER — FENTANYL CITRATE (PF) 100 MCG/2ML IJ SOLN
INTRAMUSCULAR | Status: AC
  Filled 2021-10-14: qty 2

## 2021-10-14 MED ORDER — MIDAZOLAM HCL 2 MG/2ML IJ SOLN
INTRAMUSCULAR | Status: AC
  Filled 2021-10-14: qty 2

## 2021-10-14 MED ORDER — BUPIVACAINE-EPINEPHRINE 0.5% -1:200000 IJ SOLN
INTRAMUSCULAR | Status: DC | PRN
  Administered 2021-10-14: 20 mL

## 2021-10-14 MED ORDER — PHENYLEPHRINE 40 MCG/ML (10ML) SYRINGE FOR IV PUSH (FOR BLOOD PRESSURE SUPPORT)
PREFILLED_SYRINGE | INTRAVENOUS | Status: DC | PRN
  Administered 2021-10-14 (×3): 120 ug via INTRAVENOUS
  Administered 2021-10-14: 160 ug via INTRAVENOUS
  Administered 2021-10-14: 120 ug via INTRAVENOUS
  Administered 2021-10-14: 40 ug via INTRAVENOUS
  Administered 2021-10-14: 120 ug via INTRAVENOUS

## 2021-10-14 MED ORDER — CHLORHEXIDINE GLUCONATE CLOTH 2 % EX PADS: 6.0000 | MEDICATED_PAD | Freq: Once | CUTANEOUS | Status: DC

## 2021-10-14 MED ORDER — LIDOCAINE 2% (20 MG/ML) 5 ML SYRINGE
INTRAMUSCULAR | Status: DC | PRN
  Administered 2021-10-14: 60 mg via INTRAVENOUS

## 2021-10-14 MED ORDER — ROCURONIUM BROMIDE 10 MG/ML (PF) SYRINGE
PREFILLED_SYRINGE | INTRAVENOUS | Status: DC | PRN
  Administered 2021-10-14: 30 mg via INTRAVENOUS

## 2021-10-14 MED ORDER — GLYCOPYRROLATE PF 0.2 MG/ML IJ SOSY
PREFILLED_SYRINGE | INTRAMUSCULAR | Status: DC | PRN
  Administered 2021-10-14: .2 mg via INTRAVENOUS

## 2021-10-14 MED ORDER — MIDAZOLAM HCL 5 MG/5ML IJ SOLN
INTRAMUSCULAR | Status: DC | PRN
  Administered 2021-10-14: 2 mg via INTRAVENOUS

## 2021-10-14 MED ORDER — ROCURONIUM BROMIDE 10 MG/ML (PF) SYRINGE
PREFILLED_SYRINGE | INTRAVENOUS | Status: AC
  Filled 2021-10-14: qty 10

## 2021-10-14 MED ORDER — OXYCODONE HCL 5 MG PO TABS: 5.0000 mg | ORAL_TABLET | Freq: Four times a day (QID) | ORAL | 0 refills | Status: DC | PRN

## 2021-10-14 MED ORDER — IPRATROPIUM-ALBUTEROL 0.5-2.5 (3) MG/3ML IN SOLN
3.0000 mL | Freq: Once | RESPIRATORY_TRACT | Status: AC
  Administered 2021-10-14: 3 mL via RESPIRATORY_TRACT

## 2021-10-14 MED ORDER — CEFAZOLIN SODIUM-DEXTROSE 2-4 GM/100ML-% IV SOLN
2.0000 g | INTRAVENOUS | Status: AC
  Administered 2021-10-14: 2 g via INTRAVENOUS

## 2021-10-14 MED ORDER — ACETAMINOPHEN 500 MG PO TABS
ORAL_TABLET | ORAL | Status: AC
  Filled 2021-10-14: qty 2

## 2021-10-14 MED ORDER — LIDOCAINE HCL (PF) 2 % IJ SOLN
INTRAMUSCULAR | Status: AC
  Filled 2021-10-14: qty 10

## 2021-10-14 MED ORDER — CELECOXIB 200 MG PO CAPS
ORAL_CAPSULE | ORAL | Status: AC
  Filled 2021-10-14: qty 2

## 2021-10-14 MED ORDER — PROPOFOL 10 MG/ML IV BOLUS
INTRAVENOUS | Status: DC | PRN
Start: 2021-10-14 — End: 2021-10-14
  Administered 2021-10-14: 150 mg via INTRAVENOUS
  Administered 2021-10-14: 50 mg via INTRAVENOUS

## 2021-10-14 MED ORDER — FENTANYL CITRATE (PF) 100 MCG/2ML IJ SOLN
INTRAMUSCULAR | Status: DC | PRN
  Administered 2021-10-14: 100 ug via INTRAVENOUS

## 2021-10-14 MED ORDER — DEXAMETHASONE SODIUM PHOSPHATE 10 MG/ML IJ SOLN
INTRAMUSCULAR | Status: DC | PRN
  Administered 2021-10-14: 10 mg via INTRAVENOUS

## 2021-10-14 MED ORDER — PROPOFOL 1000 MG/100ML IV EMUL
INTRAVENOUS | Status: AC
  Filled 2021-10-14: qty 100

## 2021-10-14 MED ORDER — IPRATROPIUM-ALBUTEROL 0.5-2.5 (3) MG/3ML IN SOLN
RESPIRATORY_TRACT | Status: AC
  Filled 2021-10-14: qty 3

## 2021-10-14 MED ORDER — FENTANYL CITRATE (PF) 100 MCG/2ML IJ SOLN: 25.0000 ug | INTRAMUSCULAR | Status: DC | PRN

## 2021-10-14 SURGICAL SUPPLY — 53 items
ADH SKN CLS APL DERMABOND .7 (GAUZE/BANDAGES/DRESSINGS) ×1
APL PRP STRL LF DISP 70% ISPRP (MISCELLANEOUS) ×1
APL SKNCLS STERI-STRIP NONHPOA (GAUZE/BANDAGES/DRESSINGS)
BENZOIN TINCTURE PRP APPL 2/3 (GAUZE/BANDAGES/DRESSINGS) IMPLANT
BLADE CLIPPER SENSICLIP SURGIC (BLADE) IMPLANT
BLADE SURG 15 STRL LF DISP TIS (BLADE) ×1 IMPLANT
BLADE SURG 15 STRL SS (BLADE) ×2
BNDG GAUZE ELAST 4 BULKY (GAUZE/BANDAGES/DRESSINGS) IMPLANT
CHLORAPREP W/TINT 26 (MISCELLANEOUS) ×2 IMPLANT
COVER BACK TABLE 60X90IN (DRAPES) ×2 IMPLANT
COVER MAYO STAND STRL (DRAPES) ×2 IMPLANT
DECANTER SPIKE VIAL GLASS SM (MISCELLANEOUS) IMPLANT
DERMABOND ADVANCED (GAUZE/BANDAGES/DRESSINGS) ×1
DERMABOND ADVANCED .7 DNX12 (GAUZE/BANDAGES/DRESSINGS) IMPLANT
DRAIN PENROSE 0.25X18 (DRAIN) IMPLANT
DRAIN PENROSE 0.5X18 (DRAIN) IMPLANT
DRAPE LAPAROTOMY 100X72 PEDS (DRAPES) ×2 IMPLANT
DRAPE UTILITY XL STRL (DRAPES) ×2 IMPLANT
DRSG TEGADERM 4X4.75 (GAUZE/BANDAGES/DRESSINGS) IMPLANT
ELECT COATED BLADE 2.86 ST (ELECTRODE) IMPLANT
ELECT REM PT RETURN 9FT ADLT (ELECTROSURGICAL) ×2
ELECTRODE REM PT RTRN 9FT ADLT (ELECTROSURGICAL) ×1 IMPLANT
GAUZE 4X4 16PLY ~~LOC~~+RFID DBL (SPONGE) ×2 IMPLANT
GAUZE SPONGE 4X4 12PLY STRL (GAUZE/BANDAGES/DRESSINGS) IMPLANT
GLOVE SURG POLYISO LF SZ7 (GLOVE) ×2 IMPLANT
GLOVE SURG UNDER POLY LF SZ7 (GLOVE) ×2 IMPLANT
GOWN STRL REUS W/TWL LRG LVL3 (GOWN DISPOSABLE) ×2 IMPLANT
KIT TURNOVER CYSTO (KITS) ×2 IMPLANT
NDL HYPO 25X1 1.5 SAFETY (NEEDLE) ×1 IMPLANT
NEEDLE HYPO 25X1 1.5 SAFETY (NEEDLE) ×2 IMPLANT
NS IRRIG 500ML POUR BTL (IV SOLUTION) ×1 IMPLANT
PACK BASIN DAY SURGERY FS (CUSTOM PROCEDURE TRAY) ×2 IMPLANT
PENCIL SMOKE EVACUATOR (MISCELLANEOUS) ×2 IMPLANT
SPONGE T-LAP 4X18 ~~LOC~~+RFID (SPONGE) IMPLANT
STRIP CLOSURE SKIN 1/2X4 (GAUZE/BANDAGES/DRESSINGS) IMPLANT
SUCTION FRAZIER HANDLE 10FR (MISCELLANEOUS)
SUCTION TUBE FRAZIER 10FR DISP (MISCELLANEOUS) IMPLANT
SUT ETHILON 3 0 FSL (SUTURE) ×2 IMPLANT
SUT MNCRL AB 4-0 PS2 18 (SUTURE) ×1 IMPLANT
SUT SILK 3 0 TIES 17X18 (SUTURE)
SUT SILK 3-0 18XBRD TIE BLK (SUTURE) IMPLANT
SUT VIC AB 2-0 SH 27 (SUTURE)
SUT VIC AB 2-0 SH 27XBRD (SUTURE) IMPLANT
SUT VIC AB 3-0 SH 27 (SUTURE)
SUT VIC AB 3-0 SH 27X BRD (SUTURE) IMPLANT
SUT VIC AB 3-0 SH 8-18 (SUTURE) ×1 IMPLANT
SWAB COLLECTION DEVICE MRSA (MISCELLANEOUS) IMPLANT
SWAB CULTURE ESWAB REG 1ML (MISCELLANEOUS) IMPLANT
SYR BULB IRRIG 60ML STRL (SYRINGE) IMPLANT
SYR CONTROL 10ML LL (SYRINGE) ×2 IMPLANT
TOWEL OR 17X26 10 PK STRL BLUE (TOWEL DISPOSABLE) ×2 IMPLANT
TUBE CONNECTING 12X1/4 (SUCTIONS) ×2 IMPLANT
YANKAUER SUCT BULB TIP NO VENT (SUCTIONS) ×1 IMPLANT

## 2021-10-14 NOTE — Anesthesia Procedure Notes (Signed)
Procedure Name: Intubation Date/Time: 10/14/2021 9:42 AM Performed by: Rogers Blocker, CRNA Pre-anesthesia Checklist: Patient identified, Emergency Drugs available, Suction available and Patient being monitored Patient Re-evaluated:Patient Re-evaluated prior to induction Oxygen Delivery Method: Circle System Utilized Preoxygenation: Pre-oxygenation with 100% oxygen Induction Type: IV induction Ventilation: Mask ventilation without difficulty Laryngoscope Size: Mac and 3 Grade View: Grade I Tube type: Oral Tube size: 7.0 mm Number of attempts: 1 Airway Equipment and Method: Stylet and Bite block Placement Confirmation: ETT inserted through vocal cords under direct vision, positive ETCO2 and breath sounds checked- equal and bilateral Secured at: 22 cm Tube secured with: Tape Dental Injury: Teeth and Oropharynx as per pre-operative assessment  Comments: Bottom front 4 teeth loose preoperatively. Pt advised of risk of dental damage. Smooth  induction, intubation with no damage.

## 2021-10-14 NOTE — Discharge Instructions (Addendum)
No acetaminophen/Tylenol until after 2:00pm today if needed for pain.   No ibuprofen, Advil, Aleve, Motrin, ketorolac, meloxicam, naproxen, or other NSAIDS until after 2:00pm today if needed for pain.    Post Anesthesia Home Care Instructions  Activity: Get plenty of rest for the remainder of the day. A responsible individual must stay with you for 24 hours following the procedure.  For the next 24 hours, DO NOT: -Drive a car -Paediatric nurse -Drink alcoholic beverages -Take any medication unless instructed by your physician -Make any legal decisions or sign important papers.  Meals: Start with liquid foods such as gelatin or soup. Progress to regular foods as tolerated. Avoid greasy, spicy, heavy foods. If nausea and/or vomiting occur, drink only clear liquids until the nausea and/or vomiting subsides. Call your physician if vomiting continues.  Special Instructions/Symptoms: Your throat may feel dry or sore from the anesthesia or the breathing tube placed in your throat during surgery. If this causes discomfort, gargle with warm salt water. The discomfort should disappear within 24 hours.

## 2021-10-14 NOTE — Anesthesia Postprocedure Evaluation (Signed)
Anesthesia Post Note  Patient: [name]  Procedure(s) Performed: EXCISION LEFT BACK CYSTIC MASS (Left)     Patient location during evaluation: Phase II Anesthesia Type: General Level of consciousness: awake and alert Pain management: pain level controlled Vital Signs Assessment: post-procedure vital signs reviewed and stable Respiratory status: spontaneous breathing, nonlabored ventilation, respiratory function stable and patient connected to nasal cannula oxygen Cardiovascular status: blood pressure returned to baseline and stable Postop Assessment: no apparent nausea or vomiting Anesthetic complications: no Comments: Respiratory status returned to baseline with nebulizers and time in PACU   No notable events documented.  Last Vitals:  Vitals:   10/14/21 1302 10/14/21 1315  BP:  (!) 162/85  Pulse:  100  Resp:  17  Temp:  36.6 C  SpO2: 96% 94%    Last Pain:  Vitals:   10/14/21 1315  TempSrc:   PainSc: 0-No pain                 [name]

## 2021-10-14 NOTE — H&P (Signed)
Chief Complaint: Cystic lesion   History of Present Illness: [name] is a 66 y.o. female who is seen today as an office consultation at the request of Dr. Sondra Come for evaluation of Cystic lesion .   She had had a 6 month history of bulge over her left back. It did swell and get red one time. She has pain especially when lying on it. She has not had it drained or taken antibiotics for it. She has recently been treated for lung cancer but is on surveillance now.  Review of Systems: A complete review of systems was obtained from the patient. I have reviewed this information and discussed as appropriate with the patient. See HPI as well for other ROS.  Review of Systems  Constitutional: Negative.  HENT: Negative.  Eyes: Negative.  Respiratory: Negative.  Cardiovascular: Negative.  Gastrointestinal: Negative.  Genitourinary: Negative.  Musculoskeletal: Negative.  Skin: Negative.  Neurological: Negative.  Endo/Heme/Allergies: Negative.  Psychiatric/Behavioral: Negative.    Medical History: Past Medical History:  Diagnosis Date   Anxiety   Arthritis   History of cancer   There is no problem list on file for this patient.  Past Surgical History:  Procedure Laterality Date   hand surgery   neck surgery    Allergies  Allergen Reactions   Codeine Nausea And Vomiting and Other (See Comments)  Hallucinations   Prednisone Other (See Comments)  'Made me feel funny inside my head"-pt cannot be specific   Current Outpatient Medications on File Prior to Visit  Medication Sig Dispense Refill   albuterol 90 mcg/actuation inhaler USE 2 INHALATIONS BY MOUTH INTO THE LUNGS EVERY 6 HOURS AS NEEDED FOR SHORTNESS OF BREATH OR WHEEZING   atorvastatin (LIPITOR) 40 MG tablet Take 1 tablet (40 mg total) by mouth once daily   azelastine (OPTIVAR) 0.05 % ophthalmic solution Apply to eye   baclofen (LIORESAL) 10 MG tablet Take by mouth   carbamide peroxide (DEBROX) 6.5 % otic solution  Place 5 drops in ear(s) 2 (two) times daily   chlorhexidine (PERIDEX) 0.12 % solution Use as directed 15 mLs in the mouth or throat in the morning.   diclofenac (VOLTAREN) 1 % topical gel Apply topically   fluticasone propionate (FLONASE) 50 mcg/actuation nasal spray Place into one nostril   gabapentin (NEURONTIN) 300 MG capsule Take by mouth   hydrOXYzine (ATARAX) 50 MG tablet Take by mouth   pantoprazole (PROTONIX) 40 MG DR tablet Take 1 tablet (40 mg total) by mouth once daily   traMADoL (ULTRAM) 50 mg tablet Take by mouth   acetaminophen (TYLENOL) 650 MG ER tablet Take by mouth   No current facility-administered medications on file prior to visit.   Family History  Family history unknown: Yes    Social History   Tobacco Use  Smoking Status Former   Types: Cigarettes  Smokeless Tobacco Never    Social History   Socioeconomic History   Marital status: Married  Tobacco Use   Smoking status: Former  Types: Cigarettes   Smokeless tobacco: Never  Substance and Sexual Activity   Alcohol use: Never   Drug use: Never   Objective:   Vitals:  08/27/21 1422  BP: 122/72  Pulse: (!) 111  Temp: 36.4 C (97.6 F)  SpO2: 92%  Weight: 86.6 kg (191 lb)  Height: 174 cm (5' 8.5")   Body mass index is 28.62 kg/m.  Physical Exam Constitutional:  Appearance: Normal appearance.  HENT:  Head: Normocephalic and atraumatic.  Pulmonary:  Effort: Pulmonary effort is normal.  Musculoskeletal:  General: Normal range of motion.  Cervical back: Normal range of motion.  Skin: Comments: 4 x 4 cm mass over the inferior L scapula  Neurological:  General: No focal deficit present.  Mental Status: She is alert and oriented to person, place, and time. Mental status is at baseline.  Psychiatric:  Mood and Affect: Mood normal.  Behavior: Behavior normal.  Thought Content: Thought content normal.    Labs, Imaging and Diagnostic Testing: I reviewed notes from [facility] and PET  results from 2021. I reviewed CT images and [place] images showing cystic 3.4 x 2.8 x 4.0 mass in the left mid back  Assessment and Plan:   Diagnoses and all orders for this visit:  Sebaceous cyst  The pathophysiology of skin & subcutaneous masses was discussed. Natural history risks without surgery were discussed. I recommended surgery to remove the mass. I explained the technique of removal with use of local anesthesia & possible need for more aggressive sedation/anesthesia for patient comfort.   Risks such as bleeding, infection, wound breakdown, heart attack, death, and other risks were discussed. I noted a good likelihood this will help address the problem. Possibility that this will not correct all symptoms was explained. Possibility of regrowth/recurrence of the mass was discussed. We will work to minimize complications. Questions were answered. The patient expresses understanding & wishes to proceed with surgery.

## 2021-10-14 NOTE — Op Note (Signed)
Preoperative diagnosis: back mass  Postoperative diagnosis: same   Procedure: excision of 7 x 3 cm back mass involving fascia  Surgeon: Gurney Maxin, M.D.  Asst: none  Anesthesia: fascia  Indications for procedure: [name] is a 66 y.o. year old female with symptoms of increasing back mass over left side. She presents for excision.  Description of procedure: The patient was brought into the operative suite. Anesthesia was administered with General endotracheal anesthesia. WHO checklist was applied. The patient was then placed in prone position. The area was prepped and draped in the usual sterile fashion.  Next, marcaine was infused over the area. A transverse incision was made through the skin. Cautery was used to dissect through the subcutaneous tissue. Blunt dissection and cautery was used to dissect the mass free of surrounding attachments. The mass was fatty but not uniform. It abutted the fascia and a portion of the fascia was removed because of dense adherence. The mass was removed in its entirety. The wound was irrigated. The incision was closed with 3-0 vicryl in interrupted fashion and the skin was closed with 4-0 monocryl in running subcuticular stitch. Dermabond was put in place for dressing. The patient awoke from anesthesia and brought to pacu in stable condition. All counts were correct. The patient tolerated the procedure well.   Findings: 7 x 3 cm back mass  Specimen: back mass  Implant: none   Blood loss: 10 ml  Local anesthesia:  20 ml marcaine   Complications: none  Gurney Maxin, M.D. General, Bariatric, & Minimally Invasive Surgery [facility]

## 2021-10-14 NOTE — Progress Notes (Signed)
Patient experienced breathing difficulties in PACU. Patient received Nebulizer treatments; Duoneb and Racepinephrine per verbal order of Dr. Roanna Banning at bedside. Patient was kept in PACU for observation on nonrebreather mask at 15L. Patient was then tried on as simple mask at 6L. Patient maintained saturations at 100% while on both masks. Patient was tried on room air and maintain saturations between 91% and 93%. Dr. Roanna Banning gave verbal order for patient to be moved to phase II.

## 2021-10-14 NOTE — Transfer of Care (Signed)
Immediate Anesthesia Transfer of Care Note  Patient: [name]  Procedure(s) Performed: EXCISION LEFT BACK CYSTIC MASS (Left)  Patient Location: PACU  Anesthesia Type:General  Level of Consciousness: drowsy, patient cooperative and responds to stimulation  Airway & Oxygen Therapy: Patient Spontanous Breathing  Post-op Assessment: Report given to RN Stable  Post vital signs: Reviewed and stable  Last Vitals:  Vitals Value Taken Time  BP 154/85 10/14/21 1040  Temp    Pulse 107 10/14/21 1043  Resp 20 10/14/21 1043  SpO2 88 % 10/14/21 1043  Vitals shown include unvalidated device data.  Last Pain:  Vitals:   10/14/21 0806  TempSrc: Oral  PainSc: 0-No pain         Complications: No notable events documented.

## 2021-10-14 NOTE — Anesthesia Preprocedure Evaluation (Addendum)
Anesthesia Evaluation  Patient identified by MRN, date of birth, ID band Patient awake    Reviewed: Allergy & Precautions, NPO status , Patient's Chart, lab work & pertinent test results  Airway Mallampati: III  TM Distance: >3 FB Neck ROM: Full    Dental  (+) Loose,    Pulmonary COPD,  COPD inhaler, former smoker,    Pulmonary exam normal breath sounds clear to auscultation       Cardiovascular negative cardio ROS Normal cardiovascular exam Rhythm:Regular Rate:Normal  ECG: NSR, rate 72   Neuro/Psych PSYCHIATRIC DISORDERS Anxiety Depression negative neurological ROS     GI/Hepatic Neg liver ROS, GERD  Medicated and Controlled,  Endo/Other  negative endocrine ROS  Renal/GU negative Renal ROS     Musculoskeletal  (+) Arthritis ,   Abdominal   Peds  Hematology negative hematology ROS (+)   Anesthesia Other Findings LEFT BACK MASS 4cm  Reproductive/Obstetrics                            Anesthesia Physical Anesthesia Plan  ASA: 3  Anesthesia Plan: General   Post-op Pain Management:    Induction: Intravenous  PONV Risk Score and Plan: 3 and Ondansetron, Dexamethasone, Midazolam and Treatment may vary due to age or medical condition  Airway Management Planned: Oral ETT  Additional Equipment:   Intra-op Plan:   Post-operative Plan: Extubation in OR  Informed Consent: I have reviewed the patients History and Physical, chart, labs and discussed the procedure including the risks, benefits and alternatives for the proposed anesthesia with the patient or authorized representative who has indicated his/her understanding and acceptance.     Dental advisory given  Plan Discussed with: CRNA  Anesthesia Plan Comments:        Anesthesia Quick Evaluation

## 2021-10-15 ENCOUNTER — Encounter (HOSPITAL_BASED_OUTPATIENT_CLINIC_OR_DEPARTMENT_OTHER): Payer: Self-pay | Admitting: General Surgery

## 2021-10-22 LAB — SURGICAL PATHOLOGY

## 2021-10-27 ENCOUNTER — Other Ambulatory Visit: Payer: Self-pay | Admitting: Family Medicine

## 2021-10-27 DIAGNOSIS — E785 Hyperlipidemia, unspecified: Secondary | ICD-10-CM

## 2021-10-27 NOTE — Progress Notes (Signed)
Future direct LDL. Patient with abnormal lipid panel Jan 2023. Was not taking statin regularly. Discussed, patient goal to take statin daily and retest direct LDL in 1-2 months. Due for direct LDL 2/15 - 3/15.   [name], MD

## 2021-10-29 ENCOUNTER — Other Ambulatory Visit: Payer: Self-pay

## 2021-10-29 ENCOUNTER — Other Ambulatory Visit: Payer: 59

## 2021-10-29 DIAGNOSIS — E785 Hyperlipidemia, unspecified: Secondary | ICD-10-CM

## 2021-10-30 ENCOUNTER — Encounter: Payer: Self-pay | Admitting: Family Medicine

## 2021-10-30 ENCOUNTER — Encounter (HOSPITAL_COMMUNITY): Payer: Self-pay | Admitting: Family Medicine

## 2021-10-30 LAB — LDL CHOLESTEROL, DIRECT: LDL Direct: 81 mg/dL (ref 0–99)

## 2021-11-09 ENCOUNTER — Other Ambulatory Visit: Payer: Self-pay

## 2021-11-09 DIAGNOSIS — F411 Generalized anxiety disorder: Secondary | ICD-10-CM

## 2021-11-09 MED ORDER — HYDROXYZINE HCL 50 MG PO TABS: 50.0000 mg | ORAL_TABLET | Freq: Every evening | ORAL | 2 refills | Status: DC | PRN

## 2021-11-11 ENCOUNTER — Other Ambulatory Visit: Payer: Self-pay

## 2021-11-11 DIAGNOSIS — F411 Generalized anxiety disorder: Secondary | ICD-10-CM

## 2021-12-10 ENCOUNTER — Telehealth: Payer: Self-pay | Admitting: *Deleted

## 2021-12-10 NOTE — Telephone Encounter (Signed)
CALLED PATIENT TO INFORM OF CT FOR 12-16-21- ARRIVAL TIME- 11:30 AM @ [facility], NO RESTRICTIONS TO TEST, PATIENT TO RECEIVE RESULTS FROM DR. KINARD ON 12-30-21 @ 2:30 PM, SPOKE WITH PATIENT AND SHE IS AWARE OF THESE APPTS. ?

## 2021-12-16 ENCOUNTER — Encounter (HOSPITAL_COMMUNITY): Payer: Self-pay

## 2021-12-16 ENCOUNTER — Ambulatory Visit (HOSPITAL_COMMUNITY)
Admission: RE | Admit: 2021-12-16 | Discharge: 2021-12-16 | Disposition: A | Discharge status: Home / Self Care | Payer: 59 | Source: Ambulatory Visit | Attending: Radiation Oncology | Admitting: Radiation Oncology

## 2021-12-16 DIAGNOSIS — C3432 Malignant neoplasm of lower lobe, left bronchus or lung: Secondary | ICD-10-CM | POA: Insufficient documentation

## 2021-12-20 ENCOUNTER — Ambulatory Visit: Payer: Self-pay | Admitting: Radiation Oncology

## 2021-12-29 NOTE — Progress Notes (Signed)
?Radiation Oncology         [phone] ?________________________________ ? ?Name: SAMIRAH SCARPATI MRN: [id number]  ?Date: 12/30/2021  DOB: May 13, 1956 ? ?Follow-Up Visit Note ? ?CC: [name], MD  [name], DO ? ?  ICD-10-CM   ?1. Primary adenocarcinoma of lower lobe of left lung ([place])  C34.32 CT CHEST WO CONTRAST  ?  ?2. Lesion of left lung  R91.1   ?  ? ? ?Diagnosis:  The encounter diagnosis was Primary adenocarcinoma of lower lobe of left lung ([place]). ?  ?Stage IIA (T2b, N0, M0) non-small cell lung cancer, adenocarcinoma of the left lower lobe with suspicious right lower lobe cystic lesion ? ?Interval Since Last Radiation: 1 year, 2 months, and 16 days ? ?Indication for treatment:  Curative      ?  ?Radiation treatment dates:   1/25, 1/27, 1/31, 2/2, 10/16/20 ?  ?Site/dose:    ?  ? ?Beams/energy:   SBRT, VMAT / 6X-FFF ? ?Narrative:  The patient returns today for routine follow-up and to review recent imaging, she was last seen here for follow up on 07/08/21. Since her last visit, Super-D chest CT on 08/09/22 showed the persistent consolidation, along with an increase in density of the peripheral left lower lobe process. CT also showed the cavitary lesion in the right lower lobe as stable in the interval ? ?The patient underwent an additional bronchoscopy with biopsies of the RLL on 08/17/21 under the care of [name]. Pathology revealed no evidence of malignancy.           ? ?During a follow up visit with Dr. Fabio Bering office on 08/23/21, the patient was noted to be doing well other than her baseline SOB with exertion. She denied any other symptoms.        ? ?Her most recent chest CT on 12/16/21 showed no new or progressive findings defined by stability of the the left lower lobe subpleural density (with surrounding radiation fibrosis), complex cystic area in the right lower lobe, and 5 mm left upper lobe pulmonary nodule.  ? ?Of note: the patient underwent excision of a cystic mass on her back on  10/14/21. Pathology showed a benign fibrolipoma.              ? ?Allergies:  is allergic to codeine and prednisone. ? ?Meds: ?Current Outpatient Medications  ?Medication Sig Dispense Refill  ? acetaminophen (TYLENOL) 650 MG CR tablet Take 650-1,300 mg by mouth every 8 (eight) hours as needed for pain.    ? albuterol (VENTOLIN HFA) 108 (90 Base) MCG/ACT inhaler USE 2 INHALATIONS BY MOUTH  INTO THE LUNGS EVERY 6  HOURS AS NEEDED FOR  SHORTNESS OF BREATH OR  WHEEZING 13.4 g 6  ? atorvastatin (LIPITOR) 40 MG tablet Take 1 tablet (40 mg total) by mouth at bedtime. 90 tablet 3  ? azelastine (OPTIVAR) 0.05 % ophthalmic solution Place 1 drop into both eyes 2 (two) times daily as needed for allergies.    ? baclofen (LIORESAL) 10 MG tablet Take 0.5 tablets (5 mg total) by mouth 3 (three) times daily. (Patient taking differently: Take 5 mg by mouth 3 (three) times daily as needed for muscle spasms.) 30 each 0  ? carbamide peroxide (DEBROX) 6.5 % OTIC solution Place 5 drops into both ears 2 (two) times daily. (Patient taking differently: Place 5 drops into both ears 2 (two) times daily as needed (ear wax build up).) 15 mL 0  ? chlorhexidine (PERIDEX) 0.12 % solution  Use as directed 15 mLs in the mouth or throat in the morning.    ? ciprofloxacin-dexamethasone (CIPRODEX) OTIC suspension Place 4 drops into both ears 2 (two) times daily. For 5 days 7.5 mL 0  ? clobetasol cream (TEMOVATE) 1.60 % Apply 1 application topically 2 (two) times daily as needed. (Patient taking differently: Apply 1 application. topically 2 (two) times daily as needed (psoriasis).) 30 g 2  ? diclofenac Sodium (VOLTAREN) 1 % GEL Apply 2 g topically 4 (four) times daily. Apply to painful area of ribs. Need to use regularly for relief. (Patient taking differently: Apply 2 g topically 4 (four) times daily as needed (pain.). Apply to painful area of ribs. Need to use regularly for relief.) 100 g 2  ? fluticasone (FLONASE) 50 MCG/ACT nasal spray Place 2 sprays  into both nostrils daily. (Patient taking differently: Place 2 sprays into both nostrils daily as needed (allergies).) 16 g 6  ? gabapentin (NEURONTIN) 300 MG capsule Take 1 capsule (300 mg total) by mouth at bedtime. After 4 days increase to 2 capsules (600 mg total) by mouth at bedtime. (Patient taking differently: Take 600 mg by mouth at bedtime as needed (cramps).) 60 capsule 1  ? hydrOXYzine (ATARAX) 50 MG tablet Take 1 tablet (50 mg total) by mouth at bedtime as needed for anxiety or itching (sleep). 90 tablet 2  ? ibuprofen (ADVIL) 800 MG tablet Take 1 tablet (800 mg total) by mouth every 8 (eight) hours as needed. 30 tablet 0  ? MELATONIN GUMMIES PO Take 10 mg by mouth at bedtime.    ? oxyCODONE (OXY IR/ROXICODONE) 5 MG immediate release tablet Take 1 tablet (5 mg total) by mouth every 6 (six) hours as needed for severe pain. 8 tablet 0  ? pantoprazole (PROTONIX) 40 MG tablet Take 1 tablet (40 mg total) by mouth daily. (Patient taking differently: Take 40 mg by mouth daily as needed (acid indigestion/heartburn.).) 30 tablet 3  ? traMADol (ULTRAM) 50 MG tablet Take 1 tablet (50 mg total) by mouth every 6 (six) hours as needed for moderate pain or severe pain. 30 tablet 1  ? ?No current facility-administered medications for this encounter.  ? ? ?Physical Findings: ?The patient is in no acute distress. Patient is alert and oriented. ? height is 5' 8.5" (1.74 m) and weight is 190 lb 12.8 oz (86.5 kg). Her temperature is 97.8 ?F (36.6 ?C). Her blood pressure is 137/77 and her pulse is 85. Her respiration is 20 and oxygen saturation is 96%. .  No significant changes. Lungs are clear to auscultation bilaterally. Heart has regular rate and rhythm. No palpable cervical, supraclavicular, or axillary adenopathy. Abdomen soft, non-tender, normal bowel sounds.  Examination of the surgical site in the left upper back area reveals well-healing scar.  Minimal eschar centrally.  No signs of drainage or infection. ? ? ?Lab  Findings: ?Lab Results  ?Component Value Date  ? WBC 6.6 08/17/2021  ? HGB 13.9 08/17/2021  ? HCT 43.3 08/17/2021  ? MCV 86.8 08/17/2021  ? PLT 366 08/17/2021  ? ? ?Radiographic Findings: ?CT CHEST WO CONTRAST ? ?Result Date: 12/16/2021 ?CLINICAL DATA:  Restaging non-small cell lung cancer. Status post radiation therapy. EXAM: CT CHEST WITHOUT CONTRAST TECHNIQUE: Multidetector CT imaging of the chest was performed following the standard protocol without IV contrast. RADIATION DOSE REDUCTION: This exam was performed according to the departmental dose-optimization program which includes automated exposure control, adjustment of the mA and/or kV according to patient size and/or use  of iterative reconstruction technique. COMPARISON:  Multiple prior imaging studies. The most recent chest CT is 08/09/2021. Prior PET-CT 08/17/2020 FINDINGS: Cardiovascular: The heart is normal in size. No pericardial effusion. The aorta is normal in caliber. Stable scattered atherosclerotic calcifications. Stable scattered coronary artery calcifications. Mediastinum/Nodes: No obvious mediastinal or hilar adenopathy without contrast. Small scattered lymph nodes appears stable. The esophagus is grossly normal. Stable small hiatal hernia. Lungs/Pleura: Stable underlying emphysematous changes. No acute pulmonary process. The left lower lobe subpleural density is stable. Measures approximate 3.4 x 1.6 cm. Surrounding interstitial changes is likely radiation fibrosis. No new or progressive findings are identified. Stable complex cystic area in the right lower lobe with peripheral nodularity. No significant change. Stable small pulmonary nodule along the right major fissure measuring 4 mm and likely benign lymph node. Stable 5 mm left upper lobe pulmonary nodule on image 72/7. Stable linear scarring changes in left lower lobe with a new band of density more laterally. Upper Abdomen: No significant upper abdominal findings. Stable right renal cyst.  No hepatic or adrenal gland lesions. Musculoskeletal: No breast masses, supraclavicular or axillary adenopathy. The bony thorax is intact. IMPRESSION: 1. Stable left lower lobe subpleural density with surround

## 2021-12-30 ENCOUNTER — Ambulatory Visit
Admission: RE | Admit: 2021-12-30 | Discharge: 2021-12-30 | Disposition: A | Discharge status: Home / Self Care | Payer: 59 | Source: Ambulatory Visit | Attending: Radiation Oncology | Admitting: Radiation Oncology

## 2021-12-30 ENCOUNTER — Encounter: Payer: Self-pay | Admitting: Radiation Oncology

## 2021-12-30 ENCOUNTER — Other Ambulatory Visit: Payer: Self-pay

## 2021-12-30 VITALS — BP 137/77 | HR 85 | Temp 97.8°F | Resp 20 | Ht 68.5 in | Wt 190.8 lb

## 2021-12-30 DIAGNOSIS — Z79899 Other long term (current) drug therapy: Secondary | ICD-10-CM | POA: Diagnosis not present

## 2021-12-30 DIAGNOSIS — Z85118 Personal history of other malignant neoplasm of bronchus and lung: Secondary | ICD-10-CM | POA: Insufficient documentation

## 2021-12-30 DIAGNOSIS — Z923 Personal history of irradiation: Secondary | ICD-10-CM | POA: Insufficient documentation

## 2021-12-30 DIAGNOSIS — C3432 Malignant neoplasm of lower lobe, left bronchus or lung: Secondary | ICD-10-CM

## 2021-12-30 DIAGNOSIS — R911 Solitary pulmonary nodule: Secondary | ICD-10-CM

## 2021-12-30 NOTE — Progress Notes (Signed)
[name] is here today for follow up post radiation to the lung. ? ?Lung Side: Left, patient completed treatment on 10/16/20 ? ?Does the patient complain of any of the following: ?Pain:No ?Shortness of breath w/wo exertion: Yes, on exertion.  ?Cough: No ?Hemoptysis: No ?Pain with swallowing: No ?Swallowing/choking concerns: No ?Appetite: Good ?Energy Level: Continues to have a low energy level.  ?Post radiation skin Changes:  Patient noted to have a hardened black area to left mid back due to excision left back cystic mass. Patient reports area is itchy.  ? ?Additional comments if applicable:  ?Vitals:  ? 12/30/21 1449  ?BP: 137/77  ?Pulse: 85  ?Resp: 20  ?Temp: 97.8 ?F (36.6 ?C)  ?SpO2: 96%  ?Weight: 190 lb 12.8 oz (86.5 kg)  ?Height: 5' 8.5" (1.74 m)  ?  ?

## 2022-01-13 ENCOUNTER — Other Ambulatory Visit: Payer: Self-pay | Admitting: Family Medicine

## 2022-01-13 DIAGNOSIS — R1013 Epigastric pain: Secondary | ICD-10-CM

## 2022-01-18 DIAGNOSIS — C678 Malignant neoplasm of overlapping sites of bladder: Secondary | ICD-10-CM | POA: Diagnosis not present

## 2022-01-19 ENCOUNTER — Ambulatory Visit (INDEPENDENT_AMBULATORY_CARE_PROVIDER_SITE_OTHER): Payer: Medicare Other | Admitting: Pulmonary Disease

## 2022-01-19 ENCOUNTER — Encounter: Payer: Self-pay | Admitting: Pulmonary Disease

## 2022-01-19 VITALS — BP 120/60 | HR 77 | Temp 97.8°F | Ht 68.0 in | Wt 194.4 lb

## 2022-01-19 DIAGNOSIS — J432 Centrilobular emphysema: Secondary | ICD-10-CM

## 2022-01-19 DIAGNOSIS — J449 Chronic obstructive pulmonary disease, unspecified: Secondary | ICD-10-CM

## 2022-01-19 DIAGNOSIS — Z87891 Personal history of nicotine dependence: Secondary | ICD-10-CM | POA: Diagnosis not present

## 2022-01-19 DIAGNOSIS — C3492 Malignant neoplasm of unspecified part of left bronchus or lung: Secondary | ICD-10-CM | POA: Diagnosis not present

## 2022-01-19 NOTE — Patient Instructions (Signed)
Thank you for visiting [name] at [facility]. ?Today we recommend the following: ? ?Keep ct orders as planned by Dr. Sondra Come  ?I would cancel ct in June of 2023 ? ?Return in about 1 year (around 01/20/2023), or if symptoms worsen or fail to improve, for with Eric Form, NP, or [name]. ? ? ? ?Please do your part to reduce the spread of COVID-19.  ? ?

## 2022-01-19 NOTE — Progress Notes (Signed)
? ?Synopsis: Referred in November 2021 for abnormal CT chest .  By [name], MD ? ?Subjective:  ? ?PATIENT ID: [name] GENDER: female DOB: [birth year], MRN: [id number] ? ?Chief Complaint  ?Patient presents with  ? Follow-up  ?  Follow up.   ? ? ?this is a 66 year old female, past medical history of depression, anxiety, current tobacco abuse.  She initially seen by her primary care provider which encouraged her to have a lung cancer screening CT.  She had this completed in October 2021.  The CT scan revealed bilateral lower lobe cystic peripheral based lesions both greater than 4 cm in size concerning for multifocal adenocarcinoma.  Patient has no respiratory symptoms at this time.  She is a current smoker.  She has smoked for 41 years at approximately 1 pack/day.  She presents today in the office with her husband and her daughter via phone.  Long discussion today regarding CT imaging as well as next appropriate steps.  Patient denies fevers chills night sweats weight loss or hemoptysis. ? ?OV 08/28/2020: Patient here today for follow-up after recent bronchoscopy.  Patient has a primary left lower lobe adenocarcinoma and likely has a carcinoma in the left lower lobe however tissue biopsy was inconclusive on this side both are very irregular cystic shaped lesions concerning for multifocal adenocarcinoma.  PET scan was relatively equivocal with him low to moderate SUV uptake.  Patient was referred to thoracic surgery.  Case was discussed with Dr. Kipp Brood today because she was seen earlier today in the office.  She is also been referred for evaluation of an irregular shaped bladder lesion that was found on PET scan.  Pulmonary function tests have also been completed during this time and PFTs revealed a ratio of 57, a FEV1 of 1.2 L, 57% predicted evidence of air trapping with an RV of 133% and a DLCO of 41.  Today, patient is very anxious.  She met with thoracic surgery today.  Tearful today in the office  about everything that is been going on.  Also worried about the possible bladder lesion that was found on PET scan. ? ?OV 01/20/2021: Patient here today for follow-up regarding complaints of left-sided chest pain.  This happens to be predominantly associated with big deep breaths moving around.  She can pinpoint to the pain that is located just underneath her left bra line and underneath her left breast.  She feels like the skin is tender right there.  She is not really done many things over-the-counter to see if it makes a difference.  But she says it comes on and off occasionally throughout the day.  She has been using her inhaler.  She has not had any recent follow-up with radiation oncology.  She was seen in the emergency department in April for similar complaints of chest pain.  She had a CTA of the chest that was negative for PE.  Also reviewed these images today with patient in the office after her 10 treatments of SBRT to the left lower lobe adenocarcinoma.  The right cystic-appearing lesion appears stable in comparison to previous imaging. ? ?OV 02/15/2021: Here today for follow-up.  Overall from respiratory standpoint doing well using her inhalers.  Had recent follow-up with radiation oncology.  She does have a CT scan planned for October 2022.  I discussed patient's case with radiation oncology a few weeks ago.  She is feeling less anxious.  Overall in a better mood in comparison to where she was several  months ago with new diagnosis and abnormal imaging. ? ?OV 07/21/2021: Here today for follow up regarding. Recently had ct imaging of the chest in September 2022. The right lower lobe lesion that we were following has increased slightly in size. She tolerated the SBRT to the LLL lesion well. Patient denies fevers chils, weight loss. She has quit smoking.  ? ?OV 01/19/2022: This is a 66 year old female here today for follow-up.  Recent CT scan of the chest in April shows stability of the right lower lobe  cavity.  History of adenocarcinoma on the left side status post radiation.  Radiation scarring is stable.  From respiratory standpoint she is doing well with no complaints today.  We reviewed her CT scan today in the office.  Also reviewed office note from 12/30/2021 with Dr. Sondra Come. ? ? ? ? ?Past Medical History:  ?Diagnosis Date  ? Abnormal CT lung screening 07/10/2020  ? Anxiety   ? Arthritis   ? Bladder cancer [facility]) 09/2020  ? urologist--- dr Tresa Moore, incidental finding on PET scan 12/ 2021,  s/p TURBT 09-18-2020 high grade papillary urothelial   ? Centrilobular emphysema ([place])   ? Close exposure to COVID-19 virus 05/03/2019  ? Constipation 04/09/2019  ? DDD (degenerative disc disease), lumbar   ? Depression   ? DOE (dyspnea on exertion)   ? 10-29-2020  per pt can do house work without sob, when walks/ stairs stops frequently  (stated recovers quickly when stops)  ? Emphysema lung ([place])   ? Epidermoid cyst 10/29/2018  ? Recurrent. Located on left hip at iliac crest 2" anterior to midaxillary line. Removed 2013, 2020, 2022.   ? Epigastric pain 11/16/2020  ? Fibroid 2003  ? GERD (gastroesophageal reflux disease)   ? History of radiation therapy   ? Left lung- 10/06/20-10/16/20- Dr. Gery Pray  ? Leg cramping 11/13/2019  ? Menopausal vaginal dryness 11/13/2019  ? Primary adenocarcinoma of lower lobe of left lung ([place]) 07/2020  ? oncologist--- dr Julien Nordmann---  dx 11/ 2021 bilateral lower lobe masses,  s/p bronchoscopy w/ bx's 07-28-2020 malignancy cells on left ;  completed SBRT bilateral lower lung 10-16-2020 5 fractions  ? Radiation-induced dermatitis   ? on 10-29-2020 pt complaint stomach and chest areas,  completed SBRT 10-16-2020  ? Seasonal allergies   ? Stage 3 severe COPD by GOLD classification ([place])   ? pulmonologist--- dr Valeta Harms,  using anoro inhaler daily  ? Wears glasses   ?  ? ?Family History  ?Problem Relation Age of Onset  ? Hypertension Mother   ? Hyperlipidemia Mother   ? Dementia Father   ? Lung cancer  Father   ? Parkinson's disease Father   ? Early death Sister 49  ?     Drug overdose  ? Throat cancer Brother   ? Hypertension Daughter   ? Diabetes Daughter   ?  ? ?Past Surgical History:  ?Procedure Laterality Date  ? ANTERIOR CERVICAL DECOMP/DISCECTOMY FUSION  03-08-2003 @ [place]  ? C3 --- C6  ? ARTHROTOMY Right 11/09/2015  ? Procedure: RIGHT WRIST DISTAL RADIAL ULNAR JOINT ARTHROTOMY AND DEBRIDEMENT,  AND ;  Surgeon: Iran Planas, MD;  Location: [facility];  Service: Orthopedics;  Laterality: Right;  ? BRONCHIAL BIOPSY  07/28/2020  ? Procedure: BRONCHIAL BIOPSIES;  Surgeon: [name], DO;  Location: [facility];  Service: Pulmonary;;  ? BRONCHIAL BIOPSY  08/17/2021  ? Procedure: BRONCHIAL BIOPSIES;  Surgeon: [name], DO;  Location: [facility];  Service: Pulmonary;;  ?  BRONCHIAL BRUSHINGS  07/28/2020  ? Procedure: BRONCHIAL BRUSHINGS;  Surgeon: [name], DO;  Location: [facility];  Service: Pulmonary;;  ? BRONCHIAL BRUSHINGS  08/17/2021  ? Procedure: BRONCHIAL BRUSHINGS;  Surgeon: [name], DO;  Location: [facility];  Service: Pulmonary;;  ? BRONCHIAL NEEDLE ASPIRATION BIOPSY  07/28/2020  ? Procedure: BRONCHIAL NEEDLE ASPIRATION BIOPSIES;  Surgeon: [name], DO;  Location: [facility];  Service: Pulmonary;;  ? BRONCHIAL WASHINGS  07/28/2020  ? Procedure: BRONCHIAL WASHINGS;  Surgeon: [name], DO;  Location: [facility] [facility];  Service: Pulmonary;;  ? BRONCHIAL WASHINGS  08/17/2021  ? Procedure: BRONCHIAL WASHINGS;  Surgeon: [name], DO;  Location: [facility];  Service: Pulmonary;;  ? COLONOSCOPY    ? CYSTOSCOPY WITH RETROGRADE PYELOGRAM, URETEROSCOPY AND STENT PLACEMENT N/A 09/18/2020  ? Procedure: CYSTOSCOPY WITH RETROGRADE PYELOGRAM bilateral,URETEROSCOPY AND  STENT PLACEMENT right ureter;  Surgeon: Alexis Frock, MD;  Location: [facility];  Service: Urology;  Laterality: N/A;  1 HR  ? HARDWARE REMOVAL Right 07/24/2013  ? Procedure: RIGHT WRIST HARDWARE REMOVAL,  JOINT RELEASE, RIGHT HAND MANIPULATION UNDER ANESTHESIA;  Surgeon: Linna Hoff, MD;  Location: [facility];  Service: Orthopedics;  Laterality: Right;  ? MASS EXCISION Left 10/14/2021  ? Procedu

## 2022-01-25 DIAGNOSIS — H1013 Acute atopic conjunctivitis, bilateral: Secondary | ICD-10-CM | POA: Diagnosis not present

## 2022-02-09 ENCOUNTER — Encounter (HOSPITAL_COMMUNITY): Payer: Self-pay | Admitting: Emergency Medicine

## 2022-02-09 ENCOUNTER — Other Ambulatory Visit: Payer: Self-pay

## 2022-02-09 ENCOUNTER — Ambulatory Visit (INDEPENDENT_AMBULATORY_CARE_PROVIDER_SITE_OTHER): Payer: Medicare Other

## 2022-02-09 ENCOUNTER — Telehealth: Payer: Self-pay | Admitting: Pulmonary Disease

## 2022-02-09 ENCOUNTER — Ambulatory Visit (HOSPITAL_COMMUNITY)
Admission: EM | Admit: 2022-02-09 | Discharge: 2022-02-09 | Disposition: A | Discharge status: Home / Self Care | Payer: Medicare Other | Attending: Family Medicine | Admitting: Family Medicine

## 2022-02-09 DIAGNOSIS — R0789 Other chest pain: Secondary | ICD-10-CM

## 2022-02-09 DIAGNOSIS — Z981 Arthrodesis status: Secondary | ICD-10-CM | POA: Diagnosis not present

## 2022-02-09 DIAGNOSIS — J984 Other disorders of lung: Secondary | ICD-10-CM | POA: Diagnosis not present

## 2022-02-09 NOTE — ED Provider Notes (Signed)
[facility]    CSN: [id number] Arrival date & time: 02/09/22  1816      History   Chief Complaint Chief Complaint  Patient presents with   Chest Pain    HPI [name] is a 66 y.o. female.   HPI Patient presents for evaluation of left chest wall pain and rib pain. She feels pain is similar to the pain she experienced following radiation. She is taking tylenol without improvement of symptoms. She endorses mild SOB although no change from baseline. She reports concern that a surgical wound on the left side of her back is poorly healing. Reports pulmonology related procedure in February and doesn't feel the area heal appropriately.  Past Medical History:  Diagnosis Date   Abnormal CT lung screening 07/10/2020   Anxiety    Arthritis    Bladder cancer ([place]) 09/2020   urologist--- dr Tresa Moore, incidental finding on PET scan 12/ 2021,  s/p TURBT 09-18-2020 high grade papillary urothelial    Centrilobular emphysema ([place])    Close exposure to COVID-19 virus 05/03/2019   Constipation 04/09/2019   DDD (degenerative disc disease), lumbar    Depression    DOE (dyspnea on exertion)    10-29-2020  per pt can do house work without sob, when walks/ stairs stops frequently  (stated recovers quickly when stops)   Emphysema lung ([place])    Epidermoid cyst 10/29/2018   Recurrent. Located on left hip at iliac crest 2" anterior to midaxillary line. Removed 2013, 2020, 2022.    Epigastric pain 11/16/2020   Fibroid 2003   GERD (gastroesophageal reflux disease)    History of radiation therapy    Left lung- 10/06/20-10/16/20- Dr. Gery Pray   Leg cramping 11/13/2019   Menopausal vaginal dryness 11/13/2019   Primary adenocarcinoma of lower lobe of left lung ([place]) 07/2020   oncologist--- dr Julien Nordmann---  dx 11/ 2021 bilateral lower lobe masses,  s/p bronchoscopy w/ bx's 07-28-2020 malignancy cells on left ;  completed SBRT bilateral lower lung 10-16-2020 5 fractions   Radiation-induced  dermatitis    on 10-29-2020 pt complaint stomach and chest areas,  completed SBRT 10-16-2020   Seasonal allergies    Stage 3 severe COPD by GOLD classification [facility])    pulmonologist--- dr Valeta Harms,  using anoro inhaler daily   Wears glasses     Patient Active Problem List   Diagnosis Date Noted   Skin pimple 10/04/2021   Otitis externa 09/22/2021   S/P bronchoscopy with biopsy    Mass of lower lobe of right lung 07/21/2021   Left-sided thoracic back pain 06/04/2021   Acute right ankle pain 05/28/2021   Chronic left shoulder pain 04/29/2021   Sore throat 02/18/2021   Osteopenia after menopause 01/13/2021   Pulmonary emphysema ([place]) 10/07/2020   Primary adenocarcinoma of lower lobe of left lung ([place]) 08/18/2020   Bladder cancer ([place]) 08/18/2020   Costochondritis, acute 08/14/2020   Lesion of left lung 07/28/2020   Lesion of right lung 07/28/2020   Nocturnal enuresis 06/23/2020   Elevated serum creatinine 10/22/2019   Chronic hand pain, right 03/26/2019   Chronic bilateral low back pain without sciatica 02/08/2018   HLD, secondary prevention, LDL goal <100 12/14/2017   Fatigue 10/30/2017   Oral herpes 10/19/2015   Healthcare maintenance 06/08/2015   Arthritis of spine 03/27/2015   Lumbar spine pain 01/09/2015   Hot flashes, menopausal 02/28/2012   Anxiety state 02/28/2012   Restless legs 02/28/2012    Past Surgical History:  Procedure  Laterality Date   ANTERIOR CERVICAL DECOMP/DISCECTOMY FUSION  03-08-2003 @ [facility]   C3 --- C6   ARTHROTOMY Right 11/09/2015   Procedure: RIGHT WRIST DISTAL RADIAL ULNAR JOINT ARTHROTOMY AND DEBRIDEMENT,  AND ;  Surgeon: Iran Planas, MD;  Location: [facility];  Service: Orthopedics;  Laterality: Right;   BRONCHIAL BIOPSY  07/28/2020   Procedure: BRONCHIAL BIOPSIES;  Surgeon: [name], DO;  Location: [facility];  Service: Pulmonary;;   BRONCHIAL BIOPSY  08/17/2021   Procedure: BRONCHIAL BIOPSIES;  Surgeon: [name], DO;  Location: [facility];  Service: Pulmonary;;   BRONCHIAL BRUSHINGS  07/28/2020   Procedure: BRONCHIAL BRUSHINGS;  Surgeon: [name], DO;  Location: [facility];  Service: Pulmonary;;   BRONCHIAL BRUSHINGS  08/17/2021   Procedure: BRONCHIAL BRUSHINGS;  Surgeon: [name], DO;  Location: [facility];  Service: Pulmonary;;   BRONCHIAL NEEDLE ASPIRATION BIOPSY  07/28/2020   Procedure: BRONCHIAL NEEDLE ASPIRATION BIOPSIES;  Surgeon: [name], DO;  Location: [facility];  Service: Pulmonary;;   BRONCHIAL WASHINGS  07/28/2020   Procedure: BRONCHIAL WASHINGS;  Surgeon: [name], DO;  Location: [facility];  Service: Pulmonary;;   BRONCHIAL WASHINGS  08/17/2021   Procedure: BRONCHIAL WASHINGS;  Surgeon: [name], DO;  Location: [facility];  Service: Pulmonary;;   COLONOSCOPY     CYSTOSCOPY WITH RETROGRADE PYELOGRAM, URETEROSCOPY AND STENT PLACEMENT N/A 09/18/2020   Procedure: CYSTOSCOPY WITH RETROGRADE PYELOGRAM bilateral,URETEROSCOPY AND  STENT PLACEMENT right ureter;  Surgeon: Alexis Frock, MD;  Location[facility];  Service: Urology;  Laterality: N/A;  1 HR   HARDWARE REMOVAL Right 07/24/2013   Procedure: RIGHT WRIST HARDWARE REMOVAL, JOINT RELEASE, RIGHT HAND MANIPULATION UNDER ANESTHESIA;  Surgeon: Linna Hoff, MD;  Location: [place];  Service: Orthopedics;  Laterality: Right;   MASS EXCISION Left 10/14/2021   Procedure: EXCISION LEFT BACK CYSTIC MASS;  Surgeon: Kieth Brightly, Arta Bruce, MD;  Location: [facility];  Service: General;  Laterality: Left;   OPEN REDUCTION INTERNAL FIXATION (ORIF) DISTAL RADIAL FRACTURE Right 11/08/2012   Procedure: OPEN REDUCTION INTERNAL FIXATION (ORIF) RIGHT DISTAL RADIUS FRACTURE;  Surgeon: Linna Hoff, MD;  Location: [facility];  Service: Orthopedics;  Laterality: Right;   TRANSURETHRAL RESECTION OF BLADDER TUMOR N/A 09/18/2020   Procedure: TRANSURETHRAL RESECTION OF BLADDER TUMOR (TURBT);   Surgeon: Alexis Frock, MD;  [facility];  Service: Urology;  Laterality: N/A;   TRANSURETHRAL RESECTION OF BLADDER TUMOR N/A 11/04/2020   Procedure: RESTAGING TRANSURETHRAL RESECTION OF BLADDER TUMOR (TURBT); BILATERAL RETROGRADE PYELOGRAM;  Surgeon: Alexis Frock, MD;  Location[facility];  Service: Urology;  Laterality: N/A;  1 HR   VIDEO BRONCHOSCOPY WITH ENDOBRONCHIAL NAVIGATION N/A 07/28/2020   Procedure: VIDEO BRONCHOSCOPY WITH ENDOBRONCHIAL NAVIGATION;  Surgeon: [name], DO;  Location: [facility];  Service: Pulmonary;  Laterality: N/A;   VIDEO BRONCHOSCOPY WITH RADIAL ENDOBRONCHIAL ULTRASOUND  08/17/2021   Procedure: VIDEO BRONCHOSCOPY WITH RADIAL ENDOBRONCHIAL ULTRASOUND;  Surgeon: [name], DO;  Location: [facility];  Service: Pulmonary;;   WRIST ARTHROPLASTY Right 11/09/2015   Procedure: OR DISTAL ULNAR RESECTION ARTHROPLASTY ;  Surgeon: Iran Planas, MD;  Location: [facility];  Service: Orthopedics;  Laterality: Right;   WRIST ARTHROSCOPY WITH DEBRIDEMENT Right 07/21/2014   Procedure: RIGHT WRIST DISTAL RADIAL ULNAR JOINT DEBRIDEMENT AND JOINT RELEASE POSSIBLE TENDON INTERPOSITION;  Surgeon: Linna Hoff, MD;  Location: [facility];  Service: Orthopedics;  Laterality: Right;  OB History     Gravida  2   Para  2   Term  2   Preterm      AB      Living  2      SAB      IAB      Ectopic      Multiple      Live Births               Home Medications    Prior to Admission medications   Medication Sig Start Date End Date Taking? Authorizing Provider  acetaminophen (TYLENOL) 650 MG CR tablet Take 650-1,300 mg by mouth every 8 (eight) hours as needed for pain.    [provider]  albuterol (VENTOLIN HFA) 108 (90 Base) MCG/ACT inhaler USE 2 INHALATIONS BY MOUTH  INTO THE LUNGS EVERY 6  HOURS AS NEEDED FOR  SHORTNESS OF BREATH OR  WHEEZING 06/02/21   [name], MD  atorvastatin (LIPITOR) 40 MG tablet Take 1 tablet  (40 mg total) by mouth at bedtime. 09/23/21   [name], MD  azelastine (OPTIVAR) 0.05 % ophthalmic solution Place 1 drop into both eyes 2 (two) times daily as needed for allergies. 02/18/20   [provider]  baclofen (LIORESAL) 10 MG tablet Take 0.5 tablets (5 mg total) by mouth 3 (three) times daily. Patient taking differently: Take 5 mg by mouth 3 (three) times daily as needed for muscle spasms. 06/08/21   Gifford Shave, MD  carbamide peroxide (DEBROX) 6.5 % OTIC solution Place 5 drops into both ears 2 (two) times daily. Patient taking differently: Place 5 drops into both ears 2 (two) times daily as needed (ear wax build up). 06/23/20   [name], MD  chlorhexidine (PERIDEX) 0.12 % solution Use as directed 15 mLs in the mouth or throat in the morning. 10/26/20   [provider]  ciprofloxacin-dexamethasone (CIPRODEX) OTIC suspension Place 4 drops into both ears 2 (two) times daily. For 5 days 09/16/21   Carollee Leitz, MD  clobetasol cream (TEMOVATE) 0.16 % Apply 1 application topically 2 (two) times daily as needed. Patient taking differently: Apply 1 application. topically 2 (two) times daily as needed (psoriasis). 11/09/17   Mercy Riding, MD  diclofenac Sodium (VOLTAREN) 1 % GEL Apply 2 g topically 4 (four) times daily. Apply to painful area of ribs. Need to use regularly for relief. Patient taking differently: Apply 2 g topically 4 (four) times daily as needed (pain.). Apply to painful area of ribs. Need to use regularly for relief. 01/19/21   [name], MD  fluticasone [facility] 50 MCG/ACT nasal spray Place 2 sprays into both nostrils daily. Patient taking differently: Place 2 sprays into both nostrils daily as needed (allergies). 01/07/21   Zenia Resides, MD  gabapentin (NEURONTIN) 300 MG capsule Take 1 capsule (300 mg total) by mouth at bedtime. After 4 days increase to 2 capsules (600 mg total) by mouth at bedtime. Patient taking differently: Take 600 mg by  mouth at bedtime as needed (cramps). 06/10/21   Dickie La, MD  hydrOXYzine (ATARAX) 50 MG tablet Take 1 tablet (50 mg total) by mouth at bedtime as needed for anxiety or itching (sleep). 11/09/21   [name], MD  ibuprofen (ADVIL) 800 MG tablet Take 1 tablet (800 mg total) by mouth every 8 (eight) hours as needed. 10/14/21   Kinsinger, Arta Bruce, MD  MELATONIN GUMMIES PO Take 10 mg by mouth at bedtime.    [provider]  oxyCODONE (OXY IR/ROXICODONE) 5 MG immediate release tablet Take 1 tablet (5 mg total) by mouth every 6 (six) hours as needed for severe pain. 10/14/21   Kinsinger, Arta Bruce, MD  pantoprazole (PROTONIX) 40 MG tablet Take 1 tablet (40 mg total) by mouth daily as needed (acid indigestion/heartburn.). 01/13/22   [name], MD  traMADol (ULTRAM) 50 MG tablet Take 1 tablet (50 mg total) by mouth every 6 (six) hours as needed for moderate pain or severe pain. Patient not taking: Reported on 02/09/2022 06/04/21   Dickie La, MD    Family History Family History  Problem Relation Age of Onset   Hypertension Mother    Hyperlipidemia Mother    Dementia Father    Lung cancer Father    Parkinson's disease Father    Early death Sister 8       Drug overdose   Throat cancer Brother    Hypertension Daughter    Diabetes Daughter     Social History Social History   Tobacco Use   Smoking status: Former    Packs/day: 1.00    Years: 50.00    Pack years: 50.00    Types: Cigarettes    Start date: 04/13/1975    Quit date: 07/14/2020    Years since quitting: 1.5   Smokeless tobacco: Never  Vaping Use   Vaping Use: Never used  Substance Use Topics   Alcohol use: Yes    Comment: Holidays only   Drug use: Never     Allergies   Codeine and Prednisone   Review of Systems Review of Systems Pertinent negatives listed in HPI   Physical Exam Triage Vital Signs ED Triage Vitals  Enc Vitals Group     BP 02/09/22 1936 (!) 142/84     Pulse Rate 02/09/22 1936 79      Resp 02/09/22 1936 (!) 22     Temp 02/09/22 1936 (!) 97.5 F (36.4 C)     Temp Source 02/09/22 1936 Oral     SpO2 02/09/22 1936 98 %     Weight --      Height --      Head Circumference --      Peak Flow --      Pain Score 02/09/22 1933 9     Pain Loc --      Pain Edu? --      Excl. in [place]? --    No data found.  Updated Vital Signs BP (!) 142/84 (BP Location: Left Arm)   Pulse 79   Temp (!) 97.5 F (36.4 C) (Oral)   Resp (!) 22   SpO2 98%   Visual Acuity Right Eye Distance:   Left Eye Distance:   Bilateral Distance:    Right Eye Near:   Left Eye Near:    Bilateral Near:     Physical Exam Constitutional:      Appearance: She is well-developed.  HENT:     Head: Normocephalic and atraumatic.  Cardiovascular:     Rate and Rhythm: Normal rate and regular rhythm.  Pulmonary:     Effort: Tachypnea present.  Musculoskeletal:       Back:  Skin:    Capillary Refill: Capillary refill takes less than 2 seconds.  Neurological:     General: No focal deficit present.     Mental Status: She is alert.  Psychiatric:        Mood and Affect: Mood is anxious.     UC  Treatments / Results  Labs (all labs ordered are listed, but only abnormal results are displayed) Labs Reviewed - No data to display  EKG   Radiology DG Ribs Unilateral W/Chest Left  Result Date: 02/09/2022 CLINICAL DATA:  Left chest pain EXAM: LEFT RIBS AND CHEST - 3+ VIEW COMPARISON:  08/17/2021 FINDINGS: Cardiac size is within normal limits. There are no signs of pulmonary edema. There are linear densities in the left parahilar region and both lower lung fields, possibly suggesting scarring. There is no focal consolidation. There is no pleural effusion or pneumothorax. There is previous surgical fusion in the lower cervical spine. No displaced fractures are seen in the left ribs. IMPRESSION: There are no signs of pulmonary edema or focal pulmonary consolidation. Small linear densities in the left mid  and both lower lung fields suggest scarring. No displaced fractures are seen in the left ribs. Electronically Signed   By: Elmer Picker M.D.   On: 02/09/2022 20:30    Procedures Procedures (including critical care time)  Medications Ordered in UC Medications - No data to display  Initial Impression / Assessment and Plan / UC Course  I have reviewed the triage vital signs and the nursing notes.  Pertinent labs & imaging results that were available during my care of the patient were reviewed by me and considered in my medical decision making (see chart for details).    Left sided chest wall pain Advised to follow-up with pulmonologist. Continue Tylenol. Final Clinical Impressions(s) / UC Diagnoses   Final diagnoses:  Left-sided chest wall pain     Discharge Instructions      Follow-up with Pulmonology for further evaluation of your symptoms and post surgical wound.     ED Prescriptions   None    PDMP not reviewed this encounter.

## 2022-02-09 NOTE — Telephone Encounter (Signed)
Judson Roch not in office today 5/31 so sending to [place]. Please advise.

## 2022-02-09 NOTE — Telephone Encounter (Signed)
Called and spoke with pt letting her know recs per [facility] and she verbalized understanding. Nothing further needed.

## 2022-02-09 NOTE — ED Triage Notes (Signed)
Axilla/rib/torso  pain to left side.  Onset 01/30/2022. Patient reports having torso pain in the past.  Patient reports pain worse with reaching of left arm.  Patient reports some sob.  Patient has raspy breaths at times.  Speaking in complete sentences

## 2022-02-09 NOTE — Telephone Encounter (Signed)
Would recommend she be evaluated in the ED to ensure there is not any association with her heart. Thanks.

## 2022-02-09 NOTE — Telephone Encounter (Signed)
Primary Pulmonologist: [name] Last office visit and with whom: 01-19-2022 [name]  What do we see them for (pulmonary problems): adenocarcinoma left lung, COPD stage 3  Last OV assessment/plan: see below   Was appointment offered to patient (explain)?  Yes- no availability for [name] or Eric Form NP who patient has seen before.    Reason for call: Having sharp left pain around left breast and left lung has been going on for a week. She states it is pain that is sudden and sharp but is growing more constant. She states she is concerned because of her lung cancer and would like the pain to be addressed by [name] instead of her PCP.  Patient has seen Eric Form NP.  Ernesta Amble routing to you since [name] is out of office.  Please advise. Thanks!    Allergies  Allergen Reactions   Codeine Nausea And Vomiting and Other (See Comments)    Hallucinations   Prednisone Other (See Comments)    'Made me feel funny inside my head"-pt cannot be specific    Immunization History  Administered Date(s) Administered   Fluad Quad(high Dose 65+) 06/18/2021   Influenza,inj,Quad PF,6+ Mos 07/19/2013, 07/17/2014, 06/08/2015, 10/07/2016, 09/13/2017, 06/27/2018, 06/14/2019, 06/17/2020   PFIZER Comirnaty(Gray Top)Covid-19 Tri-Sucrose Vaccine 04/26/2021   PFIZER(Purple Top)SARS-COV-2 Vaccination 12/05/2019, 12/30/2019, 06/22/2020   PPD Test 10/12/2012   Pneumococcal Conjugate-13 06/23/2020   Pneumococcal Polysaccharide-23 07/17/2014   Tdap 03/30/2012   Zoster Recombinat (Shingrix) 12/09/2017, 04/03/2018    Assessment & Plan:        ICD-10-CM    1. Adenocarcinoma, lung, left (HCC)  C34.92       2. Stage 3 severe COPD by GOLD classification ([place])  J44.9       3. Centrilobular emphysema ([place])  J43.2       4. Former smoker  Z87.891           Discussion:   66 year old female here today with severe COPD, reduced DLCO, centrilobular emphysema, left lower lobe lesion diagnosed with  adenocarcinoma status post radiation, right lower lobe nondiagnostic with a multicystic large area.  It relatively has been unchanged.  If it continues to grow then I think we can you consider options for this.  At this point we are left with active surveillance.  Plan: Patient has CT imaging already ordered by oncology office. We will continue that schedule with CT surveillance. If this cystic lesion gets bigger or more solid component grows along the outer edge we may consider biopsy to what ever may be the densest portion.  Otherwise we can continue to watch it. Continue current inhaler regimen. Follow-up with [place] in 1 year or as needed.         Current Outpatient Medications:    acetaminophen (TYLENOL) 650 MG CR tablet, Take 650-1,300 mg by mouth every 8 (eight) hours as needed for pain., Disp: , Rfl:    albuterol (VENTOLIN HFA) 108 (90 Base) MCG/ACT inhaler, USE 2 INHALATIONS BY MOUTH  INTO THE LUNGS EVERY 6  HOURS AS NEEDED FOR  SHORTNESS OF BREATH OR  WHEEZING, Disp: 13.4 g, Rfl: 6   atorvastatin (LIPITOR) 40 MG tablet, Take 1 tablet (40 mg total) by mouth at bedtime., Disp: 90 tablet, Rfl: 3   azelastine (OPTIVAR) 0.05 % ophthalmic solution, Place 1 drop into both eyes 2 (two) times daily as needed for allergies., Disp: , Rfl:    baclofen (LIORESAL) 10 MG tablet, Take 0.5 tablets (5 mg total) by  mouth 3 (three) times daily. (Patient taking differently: Take 5 mg by mouth 3 (three) times daily as needed for muscle spasms.), Disp: 30 each, Rfl: 0   carbamide peroxide (DEBROX) 6.5 % OTIC solution, Place 5 drops into both ears 2 (two) times daily. (Patient taking differently: Place 5 drops into both ears 2 (two) times daily as needed (ear wax build up).), Disp: 15 mL, Rfl: 0   chlorhexidine (PERIDEX) 0.12 % solution, Use as directed 15 mLs in the mouth or throat in the morning., Disp: , Rfl:    ciprofloxacin-dexamethasone (CIPRODEX) OTIC suspension, Place 4 drops into both ears 2 (two) times  daily. For 5 days, Disp: 7.5 mL, Rfl: 0   clobetasol cream (TEMOVATE) 2.70 %, Apply 1 application topically 2 (two) times daily as needed. (Patient taking differently: Apply 1 application. topically 2 (two) times daily as needed (psoriasis).), Disp: 30 g, Rfl: 2   diclofenac Sodium (VOLTAREN) 1 % GEL, Apply 2 g topically 4 (four) times daily. Apply to painful area of ribs. Need to use regularly for relief. (Patient taking differently: Apply 2 g topically 4 (four) times daily as needed (pain.). Apply to painful area of ribs. Need to use regularly for relief.), Disp: 100 g, Rfl: 2   fluticasone (FLONASE) 50 MCG/ACT nasal spray, Place 2 sprays into both nostrils daily. (Patient taking differently: Place 2 sprays into both nostrils daily as needed (allergies).), Disp: 16 g, Rfl: 6   gabapentin (NEURONTIN) 300 MG capsule, Take 1 capsule (300 mg total) by mouth at bedtime. After 4 days increase to 2 capsules (600 mg total) by mouth at bedtime. (Patient taking differently: Take 600 mg by mouth at bedtime as needed (cramps).), Disp: 60 capsule, Rfl: 1   hydrOXYzine (ATARAX) 50 MG tablet, Take 1 tablet (50 mg total) by mouth at bedtime as needed for anxiety or itching (sleep)., Disp: 90 tablet, Rfl: 2   ibuprofen (ADVIL) 800 MG tablet, Take 1 tablet (800 mg total) by mouth every 8 (eight) hours as needed., Disp: 30 tablet, Rfl: 0   MELATONIN GUMMIES PO, Take 10 mg by mouth at bedtime., Disp: , Rfl:    oxyCODONE (OXY IR/ROXICODONE) 5 MG immediate release tablet, Take 1 tablet (5 mg total) by mouth every 6 (six) hours as needed for severe pain., Disp: 8 tablet, Rfl: 0   pantoprazole (PROTONIX) 40 MG tablet, Take 1 tablet (40 mg total) by mouth daily as needed (acid indigestion/heartburn.)., Disp: 30 tablet, Rfl: 2   traMADol (ULTRAM) 50 MG tablet, Take 1 tablet (50 mg total) by mouth every 6 (six) hours as needed for moderate pain or severe pain., Disp: 30 tablet, Rfl: 1       [name], DO [facility] Critical [facility] 01/19/2022 2:15 PM

## 2022-02-09 NOTE — Discharge Instructions (Signed)
Follow-up with Pulmonology for further evaluation of your symptoms and post surgical wound.

## 2022-02-17 ENCOUNTER — Telehealth: Payer: Self-pay | Admitting: Physician Assistant

## 2022-02-17 NOTE — Telephone Encounter (Signed)
.  Called patient to schedule appointment per 5.31 inbasket, patient is aware of date and time.

## 2022-02-22 ENCOUNTER — Ambulatory Visit (HOSPITAL_COMMUNITY)
Admission: RE | Admit: 2022-02-22 | Discharge: 2022-02-22 | Disposition: A | Discharge status: Home / Self Care | Payer: Medicare Other | Source: Ambulatory Visit | Attending: Internal Medicine | Admitting: Internal Medicine

## 2022-02-22 DIAGNOSIS — J439 Emphysema, unspecified: Secondary | ICD-10-CM | POA: Diagnosis not present

## 2022-02-22 DIAGNOSIS — R918 Other nonspecific abnormal finding of lung field: Secondary | ICD-10-CM | POA: Insufficient documentation

## 2022-02-22 DIAGNOSIS — C3490 Malignant neoplasm of unspecified part of unspecified bronchus or lung: Secondary | ICD-10-CM | POA: Diagnosis not present

## 2022-02-25 ENCOUNTER — Ambulatory Visit (INDEPENDENT_AMBULATORY_CARE_PROVIDER_SITE_OTHER): Payer: Medicare Other | Admitting: Family Medicine

## 2022-02-25 ENCOUNTER — Telehealth: Payer: Self-pay | Admitting: Pulmonary Disease

## 2022-02-25 DIAGNOSIS — R0789 Other chest pain: Secondary | ICD-10-CM | POA: Diagnosis not present

## 2022-02-25 DIAGNOSIS — F411 Generalized anxiety disorder: Secondary | ICD-10-CM

## 2022-02-25 MED ORDER — TRAMADOL HCL 50 MG PO TABS: 50.0000 mg | ORAL_TABLET | Freq: Four times a day (QID) | ORAL | 2 refills | Status: DC | PRN

## 2022-02-25 MED ORDER — ALPRAZOLAM 0.5 MG PO TABS: 0.5000 mg | ORAL_TABLET | Freq: Every evening | ORAL | 0 refills | Status: DC | PRN

## 2022-02-25 NOTE — Progress Notes (Signed)
     SUBJECTIVE:   CHIEF COMPLAINT / HPI:   [name] is a 66 y.o. female presents for rib pain  Pt reports 5 month hx of left sided rib pain. Has tried  tylenol, heat, ice pack, lidocaine patches, voltaren gel. 8/10 severity. .She is unable to sleep due to the pain. Pt thinks it is due to radiation for her lung cancer. She had radiation last year. Pt has told her pulmonology.   Denies chest pain, sweating, palpitations, dizziness, cough, dyspnea or fevers . When she does cough it hurts. Worsened by twisting and turnings. No known injuries. 2 years was prescribe prednisone 10mg  but it made her sick to her stomach. [place] Office Visit from 02/25/2022 in [place]  PHQ-9 Total Score 4        Health Maintenance Due  Topic   COVID-19 Vaccine (5 - Booster for Pfizer series)   Pneumonia Vaccine 2+ Years old (3 - PPSV23 if available, else PCV20)    PERTINENT  PMH / PSH: primary adenocarcinoma   OBJECTIVE:   BP 131/77   Pulse 86   Ht 5\' 8"  (1.727 m)   Wt 189 lb 12.8 oz (86.1 kg)   SpO2 93%   BMI 28.86 kg/m    General: Alert, no acute distress Cardio: Normal S1 and S2, RRR, no r/m/g Pulm: CTAB, normal work of breathing Abdomen: Bowel sounds normal. Abdomen soft and non-tender.  Extremities: No peripheral edema.  Neuro: Cranial nerves grossly intact   ASSESSMENT/PLAN:   No problem-specific Assessment & Plan notes found for this encounter.    Lattie Haw, MD PGY-3 [place]

## 2022-02-25 NOTE — Telephone Encounter (Signed)
Left message for patient to call back  

## 2022-02-25 NOTE — Patient Instructions (Signed)
Thank you for coming to see me today. It was a pleasure. Today we discussed your chest wall pain. It might be due to the radiation from your cancer therapy. I recommend: Tramadol 50mg  as needed. I have give you 30 tablets Xanax 5 tablets for funeral  You need to make an appointment for your anxiety with  your PCP. Xanax is not a long term medicine for anxiety   Follow up in 2 weeks to check in on your pain   If you have any questions or concerns, please do not hesitate to call the office at [phone].  Best wishes,   Dr Posey Pronto

## 2022-02-28 DIAGNOSIS — R0789 Other chest pain: Secondary | ICD-10-CM | POA: Insufficient documentation

## 2022-02-28 DIAGNOSIS — R0781 Pleurodynia: Secondary | ICD-10-CM | POA: Insufficient documentation

## 2022-02-28 NOTE — Assessment & Plan Note (Addendum)
Pt with known hx of anxiety. She is requesting a few tablets of xanax as she has a funeral next week. I think this is appropriate, she has tolerated small doses of xanax before. PDMP reviewed. Prescribed 5 tablets of 0.5mg  Xanax. Explained to pt that this is not a long term management therapy for her anxiety and we will not refill this again for long term therapy. Follow up with PCP for anxiety management, consider starting SSRI.

## 2022-02-28 NOTE — Assessment & Plan Note (Addendum)
Point tenderness over left lower ribs, most likely related to radiation therapy for lung cancer. Precepted patient with Dr Nori Riis for pain management who recommended Tramadol for chronic pain. Pt has tolerated Tramadol before. PDMP reviewed. Prescribed 30 tablets of 50mg  Tramadol for Q6HPRN with 2 refills. Recommended pt follow up with pulmonology for worsening pain. Follow up with PCP if no improvement in symptoms.

## 2022-03-01 ENCOUNTER — Telehealth: Payer: Self-pay | Admitting: Oncology

## 2022-03-01 ENCOUNTER — Telehealth: Payer: Self-pay

## 2022-03-01 DIAGNOSIS — C3432 Malignant neoplasm of lower lobe, left bronchus or lung: Secondary | ICD-10-CM

## 2022-03-01 NOTE — Telephone Encounter (Signed)
[name] called and is having pain in her left side that is severe when she coughs.  She reports vomiting due to the pain for the past 2 days.  She denies having any hemoptysis and has shortness of breath on an off.  She is taking Tramadol but it is not helping.  Advised her that we will let Dr. Sondra Come know and call her back with any recommendations.

## 2022-03-01 NOTE — Telephone Encounter (Signed)
Patient calls nurse line regarding concerns with possible medication reaction to tramadol.   Reports that she has been vomiting, itching and sweating since starting tramadol. Reports that she started taking medication on Friday, symptom onset Saturday.   Reports that she has not taken any tramadol today and believes that she may have had slight improvement of symptoms. Patient denies difficulty swallowing. Reports that she has had intermittent mild episodes of shortness of breath with cough. Patient has also reached out to her oncologist and is going to get a chest x ray this afternoon.   Patient is requesting different pain medication. Advised patient of ED precautions. Patient verbalizes understanding.   Forwarding to Dr. Posey Pronto (provider who evaluated patient on 6/16).   Talbot Grumbling, RN

## 2022-03-01 NOTE — Telephone Encounter (Signed)
Spoke with pt who states she has had right sided rib pain that started around 2 - 3 weeks ago along with cough. Pt was scheduled for OV with Katie Cobb on 03/03/22. Nothing further needed at this time.   Routing to [employer] as [employer]

## 2022-03-01 NOTE — Telephone Encounter (Signed)
Called Estalene back and let her know that Dr. Sondra Come would like her to have a chest x ray to evaluate for a rib fracture.  [name] said she will go later this afternoon or tomorrow.  Advised her to check at [place] when she is ready.    She also discussed that she thinks the Tramadol may be making her vomit.  She is going to try taking Tylenol instead to see if she feels better.

## 2022-03-02 ENCOUNTER — Ambulatory Visit (HOSPITAL_COMMUNITY)
Admission: RE | Admit: 2022-03-02 | Discharge: 2022-03-02 | Disposition: A | Discharge status: Home / Self Care | Payer: Medicare Other | Source: Ambulatory Visit | Attending: Radiation Oncology | Admitting: Radiation Oncology

## 2022-03-02 DIAGNOSIS — R059 Cough, unspecified: Secondary | ICD-10-CM | POA: Diagnosis not present

## 2022-03-02 DIAGNOSIS — C3432 Malignant neoplasm of lower lobe, left bronchus or lung: Secondary | ICD-10-CM | POA: Insufficient documentation

## 2022-03-02 DIAGNOSIS — R0781 Pleurodynia: Secondary | ICD-10-CM | POA: Diagnosis not present

## 2022-03-03 ENCOUNTER — Telehealth: Payer: Self-pay | Admitting: Oncology

## 2022-03-03 ENCOUNTER — Encounter: Payer: Self-pay | Admitting: Nurse Practitioner

## 2022-03-03 ENCOUNTER — Ambulatory Visit (HOSPITAL_COMMUNITY)
Admission: RE | Admit: 2022-03-03 | Discharge: 2022-03-03 | Disposition: A | Discharge status: Home / Self Care | Payer: Medicare Other | Source: Ambulatory Visit | Attending: Radiation Oncology | Admitting: Radiation Oncology

## 2022-03-03 ENCOUNTER — Ambulatory Visit (INDEPENDENT_AMBULATORY_CARE_PROVIDER_SITE_OTHER): Payer: Medicare Other | Admitting: Nurse Practitioner

## 2022-03-03 DIAGNOSIS — C3432 Malignant neoplasm of lower lobe, left bronchus or lung: Secondary | ICD-10-CM | POA: Diagnosis not present

## 2022-03-03 DIAGNOSIS — R0781 Pleurodynia: Secondary | ICD-10-CM

## 2022-03-03 DIAGNOSIS — J449 Chronic obstructive pulmonary disease, unspecified: Secondary | ICD-10-CM

## 2022-03-03 DIAGNOSIS — Z0389 Encounter for observation for other suspected diseases and conditions ruled out: Secondary | ICD-10-CM | POA: Diagnosis not present

## 2022-03-03 MED ORDER — BREZTRI AEROSPHERE 160-9-4.8 MCG/ACT IN AERO
2.0000 | INHALATION_SPRAY | Freq: Two times a day (BID) | RESPIRATORY_TRACT | 5 refills | Status: DC
Start: 2022-03-03 — End: 2022-10-03

## 2022-03-03 MED ORDER — BREZTRI AEROSPHERE 160-9-4.8 MCG/ACT IN AERO: 2.0000 | INHALATION_SPRAY | Freq: Two times a day (BID) | RESPIRATORY_TRACT | 0 refills | Status: DC

## 2022-03-03 NOTE — Patient Instructions (Signed)
Start Breztri 2 puffs Twice daily. Brush tongue and rinse mouth afterwards. This is your new maintenance inhaler that you will use daily regardless of symptoms. Continue Albuterol inhaler 2 puffs every 6 hours as needed for shortness of breath or wheezing. Notify if symptoms persist despite rescue inhaler/neb use.  Follow up with Dr. Sondra Come regarding your rib x rays from today to discuss next steps.  I will discuss your CT scan results with [name] and notify you of next steps.   Follow up in 1 month with [name] or Alanson Aly. If symptoms do not improve or worsen, please contact office for sooner follow up or seek emergency care.

## 2022-03-03 NOTE — Assessment & Plan Note (Signed)
Chronic x 7 months. Suspected to be post radiation pain and tried on numerous medications. She underwent CXR yesterday due to persistence of pain, which showed a questionable irregularity to the T8 left rib. Awaiting final results from dedicated rib series. Advised that she follow up with Dr. Sondra Come to discuss next steps.

## 2022-03-03 NOTE — Telephone Encounter (Signed)
Called [name] and advised her of the results from the chest x ray from yesterday. Discussed that Dr. Sondra Come is recommending a rib series x ray to further evaluate the irregularity seen in the T8 rib on the left.  [name] said she will come in for the rib series to day at [facility] before her appointment at 2:00 with [facility].

## 2022-03-03 NOTE — Assessment & Plan Note (Signed)
Progressive DOE. 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 We discussed the purpose and importance of maintenance therapy, which she was agreeable to. Given severity of obstruction and daily symptoms, recommended she start with triple therapy - provided with Breztri samples and rx sent to pharmacy. Oxygen stable today on room air. Continue Albuterol as needed.   Patient Instructions  Start Breztri 2 puffs Twice daily. Brush tongue and rinse mouth afterwards. This is your new maintenance inhaler that you will use daily regardless of symptoms. Continue Albuterol inhaler 2 puffs every 6 hours as needed for shortness of breath or wheezing. Notify if symptoms persist despite rescue inhaler/neb use.  Follow up with Dr. Sondra Come regarding your rib x rays from today to discuss next steps.  I will discuss your CT scan results with [name] and notify you of next steps.   Follow up in 1 month with [name] or Alanson Aly. If symptoms do not improve or worsen, please contact office for sooner follow up or seek emergency care.

## 2022-03-03 NOTE — Progress Notes (Signed)
@Patient  ID: [name], female    DOB: 1956/03/13, 66 y.o.   MRN: [id number]  Chief Complaint  Patient presents with   Acute Visit    Referring provider: [name], MD  HPI: 66 year old female, former smoker (50 pack years) followed for COPD Gold 3 with centrilobular emphysema and adenocarcinoma of the left lung.  She is a patient of Dr. Juline Patch and last seen in office 01/19/2022.  Past medical history significant for bladder cancer status postresection February 2022, anxiety, RLS, HLD, chronic back pain.  She was initially seen by her PCP and encouraged to have a lung cancer screening CT due to her smoking history, which was completed in October 2021.  The CT scan revealed bilateral lower lobe cystic peripheral based lesions, both greater than 4 cm in size concerning for multifocal adenocarcinoma.  Patient had no respiratory symptoms at this time.  She underwent bronchoscopy in December 2021; pathology with adenocarcinoma of left lower lobe.  Inconclusive biopsy of the right lower lobe; however given irregular cystic shaped lesion concerned this was adenocarcinoma as well. PET scan was relatively equivocal with low to moderate SUV uptake.  She was referred to cardiothoracic surgery who deemed her not a good surgical candidate due to poor lung function.  She made the decision to undergo radiation to the left lower lobe adenocarcinoma and monitor the right side with consideration in the future for empiric treatment as well.  TEST/EVENTS:  02/22/2022 CT chest without contrast: There is atherosclerosis.  No identified LAD.  There is centrilobular emphysema and scarring in the right lung apex.  There is stable scarring in the right middle lobe posteriorly.  There is an unchanged mixed density nodule in the right lower lobe with large air cyst elements and thickened interstitial elements especially along its margins, total size is 7.8 x 5.4 cm, not significantly changed from prior exam but  enlarged when compared to 01/06/2021.  Stable mild subpleural nodularity in the right lower lobe.  There is a subpleural nodule medially in the left upper lobe which measures 0.7 x 0.7 cm and has been slowly enlarging over the last 2 years.  Previously 0.3 x 0.2.  There is stable scarring in the left lower lobe with peripheral triangular opacity, likely therapy related. 03/02/2022 CXR 2 view: No acute process.  There is mild atelectasis or scarring at the lung bases.  There is a questionable cortical irregularity right involving the T8 rib on the left  03/03/2022: Today-acute Patient presents today for persistent left-sided pain.  She has had ongoing issues with this for the last 7 months.  She has discussed this with numerous providers and been tried on different opioids, gabapentin and topical treatments with minimal relief.  She did discuss this with Dr. Sondra Come who ordered a chest x-ray that showed a deformity to the left T8 rib.  She went and had dedicated rib series today and has yet to receive results.  We also discussed her progressive DOE over the last few months.  Cough unchanged from baseline. She is not on any maintenance regimen for COPD.  She denies any wheezing, hemoptysis, weight loss, anorexia.  She does use her albuterol often.   Allergies  Allergen Reactions   Codeine Nausea And Vomiting and Other (See Comments)    Hallucinations   Prednisone Other (See Comments)    'Made me feel funny inside my head"-pt cannot be specific    Immunization History  Administered Date(s) Administered   Fluad Quad(high Dose 65+)  06/18/2021   Influenza,inj,Quad PF,6+ Mos 07/19/2013, 07/17/2014, 06/08/2015, 10/07/2016, 09/13/2017, 06/27/2018, 06/14/2019, 06/17/2020   PFIZER Comirnaty(Gray Top)Covid-19 Tri-Sucrose Vaccine 04/26/2021   PFIZER(Purple Top)SARS-COV-2 Vaccination 12/05/2019, 12/30/2019, 06/22/2020   PPD Test 10/12/2012   Pneumococcal Conjugate-13 06/23/2020   Pneumococcal Polysaccharide-23  07/17/2014   Tdap 03/30/2012   Zoster Recombinat (Shingrix) 12/09/2017, 04/03/2018    Past Medical History:  Diagnosis Date   Abnormal CT lung screening 07/10/2020   Anxiety    Arthritis    Bladder cancer ([place]) 09/2020   urologist--- dr Tresa Moore, incidental finding on PET scan 12/ 2021,  s/p TURBT 09-18-2020 high grade papillary urothelial    Centrilobular emphysema ([place])    Close exposure to COVID-19 virus 05/03/2019   Constipation 04/09/2019   DDD (degenerative disc disease), lumbar    Depression    DOE (dyspnea on exertion)    10-29-2020  per pt can do house work without sob, when walks/ stairs stops frequently  (stated recovers quickly when stops)   Emphysema lung ([place])    Epidermoid cyst 10/29/2018   Recurrent. Located on left hip at iliac crest 2" anterior to midaxillary line. Removed 2013, 2020, 2022.    Epigastric pain 11/16/2020   Fibroid 2003   GERD (gastroesophageal reflux disease)    History of radiation therapy    Left lung- 10/06/20-10/16/20- Dr. Gery Pray   Leg cramping 11/13/2019   Menopausal vaginal dryness 11/13/2019   Primary adenocarcinoma of lower lobe of left lung ([place]) 07/2020   oncologist--- dr Julien Nordmann---  dx 11/ 2021 bilateral lower lobe masses,  s/p bronchoscopy w/ bx's 07-28-2020 malignancy cells on left ;  completed SBRT bilateral lower lung 10-16-2020 5 fractions   Radiation-induced dermatitis    on 10-29-2020 pt complaint stomach and chest areas,  completed SBRT 10-16-2020   Seasonal allergies    Stage 3 severe COPD by GOLD classification [facility])    pulmonologist--- dr Valeta Harms,  using anoro inhaler daily   Wears glasses     Tobacco History: Social History   Tobacco Use  Smoking Status Former   Packs/day: 1.00   Years: 50.00   Total pack years: 50.00   Types: Cigarettes   Start date: 04/13/1975   Quit date: 07/14/2020   Years since quitting: 1.6  Smokeless Tobacco Never   Counseling given: Not Answered   Outpatient Medications Prior to  Visit  Medication Sig Dispense Refill   acetaminophen (TYLENOL) 650 MG CR tablet Take 650-1,300 mg by mouth every 8 (eight) hours as needed for pain.     albuterol (VENTOLIN HFA) 108 (90 Base) MCG/ACT inhaler USE 2 INHALATIONS BY MOUTH  INTO THE LUNGS EVERY 6  HOURS AS NEEDED FOR  SHORTNESS OF BREATH OR  WHEEZING 13.4 g 6   ALPRAZolam (XANAX) 0.5 MG tablet Take 1 tablet (0.5 mg total) by mouth at bedtime as needed for up to 5 doses for anxiety. 5 tablet 0   atorvastatin (LIPITOR) 40 MG tablet Take 1 tablet (40 mg total) by mouth at bedtime. 90 tablet 3   azelastine (OPTIVAR) 0.05 % ophthalmic solution Place 1 drop into both eyes 2 (two) times daily as needed for allergies.     baclofen (LIORESAL) 10 MG tablet Take 0.5 tablets (5 mg total) by mouth 3 (three) times daily. (Patient taking differently: Take 5 mg by mouth 3 (three) times daily as needed for muscle spasms.) 30 each 0   carbamide peroxide (DEBROX) 6.5 % OTIC solution Place 5 drops into both ears 2 (two) times daily. (Patient taking  differently: Place 5 drops into both ears 2 (two) times daily as needed (ear wax build up).) 15 mL 0   chlorhexidine (PERIDEX) 0.12 % solution Use as directed 15 mLs in the mouth or throat in the morning.     fluticasone (FLONASE) 50 MCG/ACT nasal spray Place 2 sprays into both nostrils daily. (Patient taking differently: Place 2 sprays into both nostrils daily as needed (allergies).) 16 g 6   gabapentin (NEURONTIN) 300 MG capsule Take 1 capsule (300 mg total) by mouth at bedtime. After 4 days increase to 2 capsules (600 mg total) by mouth at bedtime. (Patient taking differently: Take 600 mg by mouth at bedtime as needed (cramps).) 60 capsule 1   hydrOXYzine (ATARAX) 50 MG tablet Take 1 tablet (50 mg total) by mouth at bedtime as needed for anxiety or itching (sleep). 90 tablet 2   MELATONIN GUMMIES PO Take 10 mg by mouth at bedtime.     pantoprazole (PROTONIX) 40 MG tablet Take 1 tablet (40 mg total) by mouth  daily as needed (acid indigestion/heartburn.). 30 tablet 2   ciprofloxacin-dexamethasone (CIPRODEX) OTIC suspension Place 4 drops into both ears 2 (two) times daily. For 5 days (Patient not taking: Reported on 03/03/2022) 7.5 mL 0   clobetasol cream (TEMOVATE) 8.65 % Apply 1 application topically 2 (two) times daily as needed. (Patient not taking: Reported on 03/03/2022) 30 g 2   diclofenac Sodium (VOLTAREN) 1 % GEL Apply 2 g topically 4 (four) times daily. Apply to painful area of ribs. Need to use regularly for relief. (Patient not taking: Reported on 03/03/2022) 100 g 2   ibuprofen (ADVIL) 800 MG tablet Take 1 tablet (800 mg total) by mouth every 8 (eight) hours as needed. (Patient not taking: Reported on 03/03/2022) 30 tablet 0   oxyCODONE (OXY IR/ROXICODONE) 5 MG immediate release tablet Take 1 tablet (5 mg total) by mouth every 6 (six) hours as needed for severe pain. (Patient not taking: Reported on 03/03/2022) 8 tablet 0   traMADol (ULTRAM) 50 MG tablet Take 1 tablet (50 mg total) by mouth every 6 (six) hours as needed for moderate pain or severe pain. 30 tablet 2   No facility-administered medications prior to visit.     Review of Systems:   Constitutional: No weight loss or gain, night sweats, fevers, chills, fatigue, or lassitude. HEENT: No headaches, difficulty swallowing, tooth/dental problems, or sore throat. No sneezing, itching, ear ache, nasal congestion, or post nasal drip CV:  No chest pain, orthopnea, PND, swelling in lower extremities, anasarca, dizziness, palpitations, syncope Resp: +shortness of breath with exertion, chronic cough (unchanged). No excess mucus or change in color of mucus.. No hemoptysis. No wheezing.  No chest wall deformity Skin: No rash, lesions, ulcerations MSK:  No joint pain or swelling.  No decreased range of motion.  No back pain. +persistent left sided rib pain Neuro: No dizziness or lightheadedness.  Psych: No depression or anxiety. Mood stable.      Physical Exam:  BP 122/76 (BP Location: Left Arm, Patient Position: Sitting, Cuff Size: Normal)   Pulse 83   Temp 98.3 F (36.8 C) (Oral)   Ht 5' 8.5" (1.74 m)   Wt 187 lb 9.6 oz (85.1 kg)   SpO2 97%   BMI 28.11 kg/m   GEN: Pleasant, interactive, well-appearing; in no acute distress. HEENT:  Normocephalic and atraumatic. PERRLA. Sclera white. Nasal turbinates pink, moist and patent bilaterally. No rhinorrhea present. Oropharynx pink and moist, without exudate or edema. No lesions,  ulcerations, or postnasal drip.  NECK:  Supple w/ fair ROM. No JVD present. Normal carotid impulses w/o bruits. Thyroid symmetrical with no goiter or nodules palpated. No lymphadenopathy.   CV: RRR, no m/r/g, no peripheral edema. Pulses intact, +2 bilaterally. No cyanosis, pallor or clubbing. PULMONARY:  Unlabored, regular breathing. Diminished bilaterally A&P w/o wheezes/rales/rhonchi. No accessory muscle use. No dullness to percussion. GI: BS present and normoactive. Soft, non-tender to palpation. No organomegaly or masses detected. No CVA tenderness. MSK: No erythema, warmth. Cap refil <2 sec all extrem. No deformities or joint swelling noted. Tenderness upon palpation to left lateral chest wall. Neuro: A/Ox3. No focal deficits noted.   Skin: Warm, no lesions or rashe Psych: Normal affect and behavior. Judgement and thought content appropriate.     Lab Results:  CBC    Component Value Date/Time   WBC 6.6 08/17/2021 0715   RBC 4.99 08/17/2021 0715   HGB 13.9 08/17/2021 0715   HGB 13.1 08/18/2020 1348   HGB 14.1 06/23/2020 1237   HCT 43.3 08/17/2021 0715   HCT 43.6 06/23/2020 1237   PLT 366 08/17/2021 0715   PLT 360 08/18/2020 1348   PLT 366 06/23/2020 1237   MCV 86.8 08/17/2021 0715   MCV 86 06/23/2020 1237   MCH 27.9 08/17/2021 0715   MCHC 32.1 08/17/2021 0715   RDW 13.6 08/17/2021 0715   RDW 13.2 06/23/2020 1237   LYMPHSABS 1.3 12/17/2020 1029   LYMPHSABS 2.2 06/23/2020 1237    MONOABS 0.7 12/17/2020 1029   EOSABS 0.2 12/17/2020 1029   EOSABS 0.2 06/23/2020 1237   BASOSABS 0.1 12/17/2020 1029   BASOSABS 0.1 06/23/2020 1237    BMET    Component Value Date/Time   NA 138 12/17/2020 1029   NA 142 06/23/2020 1237   K 3.8 12/17/2020 1029   CL 104 12/17/2020 1029   CO2 26 12/17/2020 1029   GLUCOSE 90 12/17/2020 1029   BUN 15 12/17/2020 1029   BUN 19 06/23/2020 1237   CREATININE 1.06 (H) 12/17/2020 1029   CREATININE 1.05 (H) 08/18/2020 1348   CREATININE 0.95 02/10/2015 1526   CALCIUM 9.0 12/17/2020 1029   GFRNONAA 58 (L) 12/17/2020 1029   GFRNONAA 59 (L) 08/18/2020 1348   GFRAA 69 06/23/2020 1237    BNP    Component Value Date/Time   BNP 66.1 12/17/2020 1029     Imaging:  DG Chest 2 View  Result Date: 03/02/2022 CLINICAL DATA:  Status post radiation to left lung with left-sided rib pain and cough. Concern for rib fracture. EXAM: CHEST - 2 VIEW COMPARISON:  02/09/2022. FINDINGS: The heart size and mediastinal contours are within normal limits. There is atherosclerotic calcification of the aorta. Mild atelectasis or scarring is noted at the lung bases. No consolidation, effusion, or pneumothorax. There is questionable cortical irregularity involving the T8 rib on the left. IMPRESSION: 1. Mild atelectasis or scarring at the lung bases. 2. Questionable cortical irregularity involving the T8 rib on the left. Dedicated rib series may be beneficial for further evaluation. Electronically Signed   By: Brett Fairy M.D.   On: 03/02/2022 21:42   CT Chest Wo Contrast  Result Date: 02/23/2022 CLINICAL DATA:  Non-small cell lung cancer. Prior radiation therapy. * Tracking Code: BO * EXAM: CT CHEST WITHOUT CONTRAST TECHNIQUE: Multidetector CT imaging of the chest was performed following the standard protocol without IV contrast. RADIATION DOSE REDUCTION: This exam was performed according to the departmental dose-optimization program which includes automated exposure  control, adjustment of  the mA and/or kV according to patient size and/or use of iterative reconstruction technique. COMPARISON:  12/16/2021 FINDINGS: Cardiovascular: Coronary, aortic arch, and branch vessel atherosclerotic vascular disease. Aberrant right subclavian artery passes behind the esophagus. Mediastinum/Nodes: No pathologic adenopathy identified. Hilar lymph nodes could be obscured by vasculature which is of similar density on noncontrast CT. Lungs/Pleura: Centrilobular emphysema. Stable scarring in the right lung apex. Stable scarring the right middle lobe posteriorly. Unchanged mixed density nodule in the right lower lobe with large air cyst elements and thickened interstitial elements especially along its margins, the total size of this lesion is 7.8 by 5.4 cm, not changed from prior exam, but enlarged compared to 12/17/2020 where this lesion measured 6.1 by 3.4 cm. Stable mild subpleural nodularity in the right lower lobe along the major fissure on image 88 series 5. A subpleural nodule medially in the left upper lobe measures 0.7 by 0.7 cm on image 67 series 5, and has been slowly enlarging over the last 2 years. This measured 0.3 by 0.2 cm on 12/17/2020. Stable scarring in the left lower lobe with peripheral triangular opacity in the left lower lobe which is likely primarily therapy related, measuring 3.6 by 1.7 cm, stable. No contour change. Upper Abdomen: Stable benign cyst of the right kidney upper pole, this does not require any further workup. Musculoskeletal: Right greater than left degenerative glenohumeral arthropathy. Lower cervical plate and screw fixator. Mild thoracic spondylosis. IMPRESSION: 1. Stable appearance of focal pleural-based consolidation in the left lower lobe, likely therapy related. 2. The left upper lobe nodule shown on image 67 series 5 has been slowly enlarging. It previously measured 0.3 by 0.2 cm on 12/17/2020. Low-grade adenocarcinoma is a distinct possibility. 3.  Similarly the lesion with architectural distortion, air cyst elements, and marginal thickening in the right lower lobe is not substantially changed from the prior exam, but has enlarged moderately when compared to 12/17/2020, and is suspicious for potential low-grade adenocarcinoma. 4. Other imaging findings of potential clinical significance: Aortic Atherosclerosis (ICD10-I70.0). Coronary atherosclerosis. Aberrant right subclavian artery. Emphysema (ICD10-J43.9). Electronically Signed   By: Van Clines M.D.   On: 02/23/2022 19:43   DG Ribs Unilateral W/Chest Left  Result Date: 02/09/2022 CLINICAL DATA:  Left chest pain EXAM: LEFT RIBS AND CHEST - 3+ VIEW COMPARISON:  08/17/2021 FINDINGS: Cardiac size is within normal limits. There are no signs of pulmonary edema. There are linear densities in the left parahilar region and both lower lung fields, possibly suggesting scarring. There is no focal consolidation. There is no pleural effusion or pneumothorax. There is previous surgical fusion in the lower cervical spine. No displaced fractures are seen in the left ribs. IMPRESSION: There are no signs of pulmonary edema or focal pulmonary consolidation. Small linear densities in the left mid and both lower lung fields suggest scarring. No displaced fractures are seen in the left ribs. Electronically Signed   By: Elmer Picker M.D.   On: 02/09/2022 20:30         Latest Ref Rng & Units 08/03/2020    9:11 AM  PFT Results  FVC-Pre L 2.03   FVC-Predicted Pre % 67   FVC-Post L 2.15   FVC-Predicted Post % 71   Pre FEV1/FVC % % 67   Post FEV1/FCV % % 57   FEV1-Pre L 1.37   FEV1-Predicted Pre % 58   FEV1-Post L 1.22   DLCO uncorrected ml/min/mmHg 9.46   DLCO UNC% % 41   DLCO corrected ml/min/mmHg 9.26  DLCO COR %Predicted % 40   DLVA Predicted % 57   TLC L 5.13   TLC % Predicted % 90   RV % Predicted % 133     No results found for: "NITRICOXIDE"      Assessment & Plan:   Stage 3  severe COPD by GOLD classification ([place]) Progressive DOE. 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 We discussed the purpose and importance of maintenance therapy, which she was agreeable to. Given severity of obstruction and daily symptoms, recommended she start with triple therapy - provided with Breztri samples and rx sent to pharmacy. Oxygen stable today on room air. Continue Albuterol as needed.   Patient Instructions  Start Breztri 2 puffs Twice daily. Brush tongue and rinse mouth afterwards. This is your new maintenance inhaler that you will use daily regardless of symptoms. Continue Albuterol inhaler 2 puffs every 6 hours as needed for shortness of breath or wheezing. Notify if symptoms persist despite rescue inhaler/neb use.  Follow up with Dr. Sondra Come regarding your rib x rays from today to discuss next steps.  I will discuss your CT scan results with [name] and notify you of next steps.   Follow up in 1 month with [name] or Alanson Aly. If symptoms do not improve or worsen, please contact office for sooner follow up or seek emergency care.     Primary adenocarcinoma of lower lobe of left lung (HCC) Treated with SBRT. She has a RLL lesion and LUL nodule that have been under surveillance. Recent CT chest showed that the RLL lesion has not significantly changed in size since 12/2021 but has enlarged since 12/2020. The LUL nodule has increased and now measures 0.7x0.7 cm from 5 mm in April; concerning for adenocarcinoma as well. We discussed these findings today; may need biopsy or empiric radiation. I will follow up with [name] and review imaging to determine next steps.   Rib pain on left side Chronic x 7 months. Suspected to be post radiation pain and tried on numerous medications. She underwent CXR yesterday due to persistence of pain, which showed a questionable irregularity to the T8 left rib. Awaiting final results from dedicated rib series.  Advised that she follow up with Dr. Sondra Come to discuss next steps.    I spent 35 minutes of dedicated to the care of this patient on the date of this encounter to include pre-visit review of records, face-to-face time with the patient discussing conditions above, post visit ordering of testing, clinical documentation with the electronic health record, making appropriate referrals as documented, and communicating necessary findings to members of the patients care team.  Clayton Bibles, NP 03/03/2022  Pt aware and understands NP's role.

## 2022-03-03 NOTE — Assessment & Plan Note (Signed)
Treated with SBRT. She has a RLL lesion and LUL nodule that have been under surveillance. Recent CT chest showed that the RLL lesion has not significantly changed in size since 12/2021 but has enlarged since 12/2020. The LUL nodule has increased and now measures 0.7x0.7 cm from 5 mm in April; concerning for adenocarcinoma as well. We discussed these findings today; may need biopsy or empiric radiation. I will follow up with [name] and review imaging to determine next steps.

## 2022-03-04 ENCOUNTER — Telehealth: Payer: Self-pay | Admitting: Nurse Practitioner

## 2022-03-06 ENCOUNTER — Other Ambulatory Visit: Payer: Self-pay | Admitting: Family Medicine

## 2022-03-06 DIAGNOSIS — C3432 Malignant neoplasm of lower lobe, left bronchus or lung: Secondary | ICD-10-CM

## 2022-03-07 ENCOUNTER — Ambulatory Visit (INDEPENDENT_AMBULATORY_CARE_PROVIDER_SITE_OTHER): Payer: Medicare Other | Admitting: Pulmonary Disease

## 2022-03-07 ENCOUNTER — Encounter: Payer: Self-pay | Admitting: Pulmonary Disease

## 2022-03-07 ENCOUNTER — Telehealth: Payer: Self-pay | Admitting: Oncology

## 2022-03-07 VITALS — BP 124/68 | HR 104 | Temp 97.8°F | Ht 69.0 in | Wt 189.8 lb

## 2022-03-07 DIAGNOSIS — Z87891 Personal history of nicotine dependence: Secondary | ICD-10-CM

## 2022-03-07 DIAGNOSIS — J432 Centrilobular emphysema: Secondary | ICD-10-CM

## 2022-03-07 DIAGNOSIS — C3492 Malignant neoplasm of unspecified part of left bronchus or lung: Secondary | ICD-10-CM

## 2022-03-07 DIAGNOSIS — J449 Chronic obstructive pulmonary disease, unspecified: Secondary | ICD-10-CM

## 2022-03-07 DIAGNOSIS — R0781 Pleurodynia: Secondary | ICD-10-CM | POA: Diagnosis not present

## 2022-03-07 DIAGNOSIS — C3432 Malignant neoplasm of lower lobe, left bronchus or lung: Secondary | ICD-10-CM | POA: Diagnosis not present

## 2022-03-08 ENCOUNTER — Telehealth: Payer: Self-pay | Admitting: Family Medicine

## 2022-03-08 ENCOUNTER — Telehealth: Payer: Self-pay | Admitting: *Deleted

## 2022-03-08 NOTE — Telephone Encounter (Signed)
CALLED PATIENT TO INFORM OF CT FOR 05-25-22- ARRIVAL TIME- 10:30 AM @ [facility], NO RESTRICTIONS TO TEST, AND PATIENT TO RECEIVE CT RESULTS FROM DR. KINARD ON 05-26-22 @ 11:45 AM, SPOKE WITH PATIENT AND SHE IS AWARE OF THESE APPTS.

## 2022-03-10 NOTE — Telephone Encounter (Signed)
Pt was called by Dr. Clabe Seal office about the results of the recent cxrs. Nothing further needed.

## 2022-03-11 ENCOUNTER — Telehealth: Payer: Self-pay

## 2022-03-11 ENCOUNTER — Encounter: Payer: Self-pay | Admitting: Family Medicine

## 2022-03-11 ENCOUNTER — Ambulatory Visit (INDEPENDENT_AMBULATORY_CARE_PROVIDER_SITE_OTHER): Payer: Medicare Other | Admitting: Family Medicine

## 2022-03-11 VITALS — BP 110/64 | HR 92 | Ht 69.0 in | Wt 186.0 lb

## 2022-03-11 DIAGNOSIS — G893 Neoplasm related pain (acute) (chronic): Secondary | ICD-10-CM | POA: Insufficient documentation

## 2022-03-11 HISTORY — DX: Neoplasm related pain (acute) (chronic): G89.3

## 2022-03-11 MED ORDER — OXYCODONE HCL 5 MG PO TABS: 5.0000 mg | ORAL_TABLET | ORAL | 0 refills | Status: DC | PRN

## 2022-03-11 NOTE — Progress Notes (Signed)
     SUBJECTIVE:   CHIEF COMPLAINT / HPI:   [name] is a 66 y.o. female presents for follow-up for rib pain   Left-sided rib pain Patient seen on 02/25/22 by myself for ongoing left-sided rib pain.  I prescribed her tramadol however unfortunately she did not tolerate it vomiting. She still reports on going rib pain especially at night, on any kind of movement.  Her oncologist ordered a chest x-ray which showed no rib abnormality is pleural based scarring posteriorlyshe has patient out to her oncologist Dr. Sondra [name] and is waiting for a follow-up with him/her.  [name] Office Visit from 03/11/2022 in [place]  PHQ-9 Total Score 2        PERTINENT  PMH / PSH: Adenocarcinoma of lung, hyperlipidemia, bladder carcinoma  OBJECTIVE:   BP 110/64   Pulse 92   Ht 5\' 9"  (1.753 m)   Wt 186 lb (84.4 kg)   SpO2 98%   BMI 27.47 kg/m    General: Alert, no acute distress Cardio: Well-perfused Pulm: normal work of breathing Neuro: Cranial nerves grossly intact   ASSESSMENT/PLAN:   Cancer related pain Left-sided rib pain most likely related to scarring from radiotherapy for adenocarcinoma of lung.  Patient did not tolerate tramadol.  She has had a prescription of oxycodone in the past back in February which she tolerated well.  Precepted patient with Dr. Owens [name] who is in agreement that given her cancer history we can prescribe a short course of oxycodone.  Explained to patient that this should not be a long-term medication most patients given the risks such as respiratory depression, dependence etc.  PDMP reviewed prescribed 15 tablets of oxycodone IR 5 mg.  Patient should follow-up with PCP Dr [name] for further discussion if she requires more medication. I also discussed palliative care for further management of her pain related to cancer.  Patient is in agreement.  I placed a referral for palliative care.    Laura Haw, MD PGY-3 [place]

## 2022-03-11 NOTE — Telephone Encounter (Signed)
Spoke with patient and scheduled a telephonic Palliative Consult for 03/17/22 @ 12 PM.   Consent obtained; updated N[employer], Team List and Epic.

## 2022-03-11 NOTE — Assessment & Plan Note (Addendum)
Left-sided rib pain most likely related to scarring from radiotherapy for adenocarcinoma of lung.  Patient did not tolerate tramadol.  She has had a prescription of oxycodone in the past back in February which she tolerated well.  Precepted patient with Dr. Owens Shark who is in agreement that given her cancer history we can prescribe a short course of oxycodone.  Explained to patient that this should not be a long-term medication most patients given the risks such as respiratory depression, dependence etc.  PDMP reviewed prescribed 15 tablets of oxycodone IR 5 mg.  Patient should follow-up with PCP Dr Jeani Hawking for further discussion if she requires more medication. I also discussed palliative care for further management of her pain related to cancer.  Patient is in agreement.  I placed a referral for palliative care.

## 2022-03-11 NOTE — Patient Instructions (Signed)
Thank you for coming to see me today. It was a pleasure. Today we discussed your cancer related pain. I am sorry you are going through this. I recommend referral to palliative care for pain management related to cancer pain. I have prescribe 15 tablets of oxycodone. This is not a long term medication. Please use this as needed only. Can also take tylenol 650mg  every 4-6 hrs for pain   Please follow-up with your PCP   If you have any questions or concerns, please do not hesitate to call the office at [phone].  Best wishes,   Dr Posey Pronto

## 2022-03-16 IMAGING — CT CT CHEST LUNG CANCER SCREENING LOW DOSE W/O CM
2 of 5 series · 14 of 40 positions shown, 17 images · non-contrast
Comparison: None.

CLINICAL DATA: 64-year-old female with 45 pack-year history of
smoking. Lung cancer screening.

EXAM:
CT CHEST WITHOUT CONTRAST LOW-DOSE FOR LUNG CANCER SCREENING
TECHNIQUE: Multidetector CT imaging of the chest was performed following the
standard protocol without IV contrast.

[Series 4: lung 1.00 br44 cor · coronal · 0.56mm/px · 3 of 274 slices shown]
[im 55/274  lung]
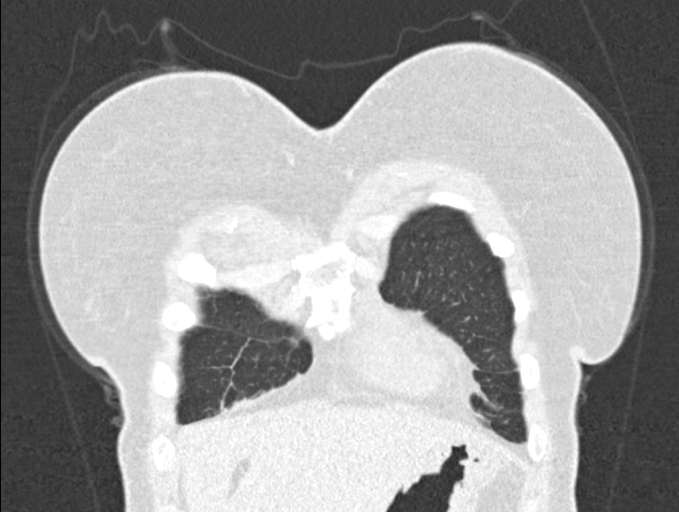
[im 110/274  lung]
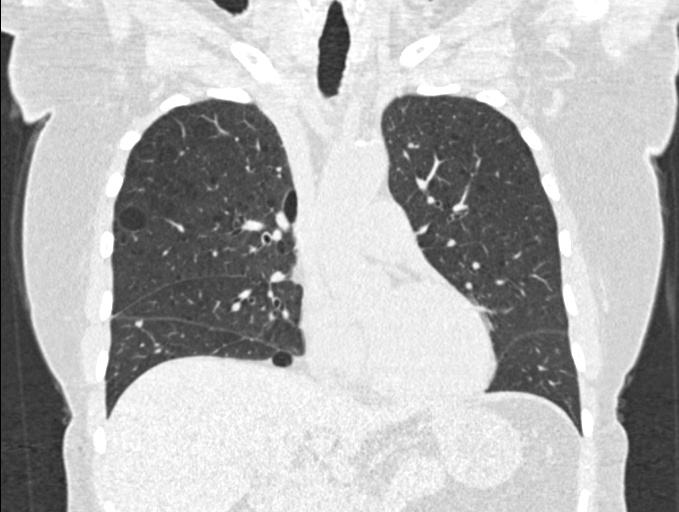
[im 164/274  lung]
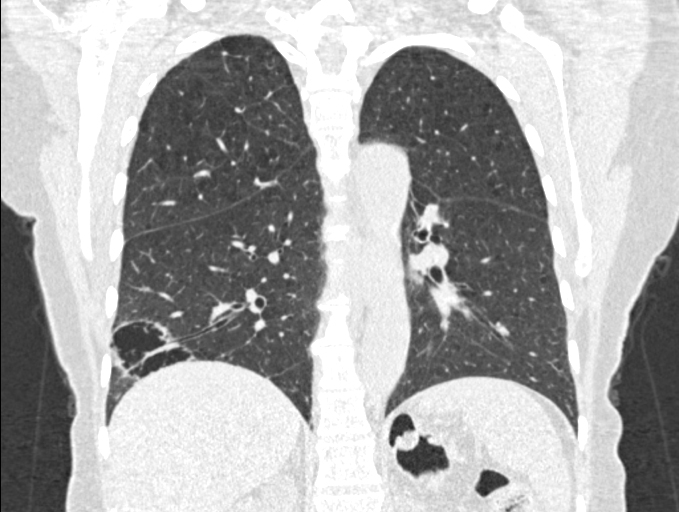

[Series 9: lung 1.00 br60 axial · axial · 0.74mm/px · z∈[-942,-682]mm · 11 of 286 slices shown, 14 images]
[im 13/286  mediastinal]
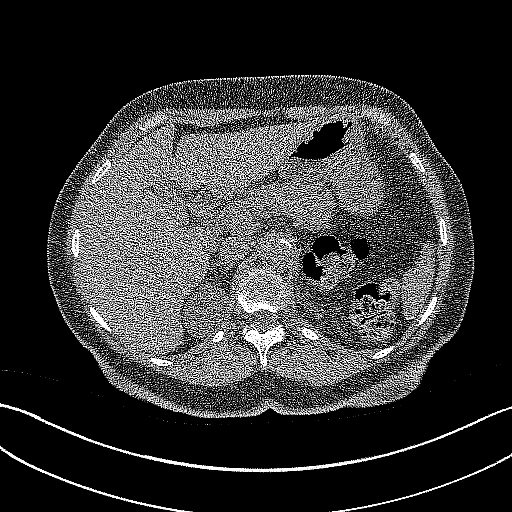
[im 13/286  lung]
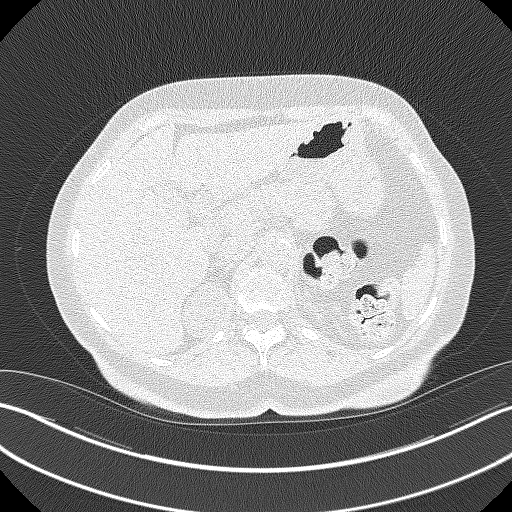
[im 39/286  lung]
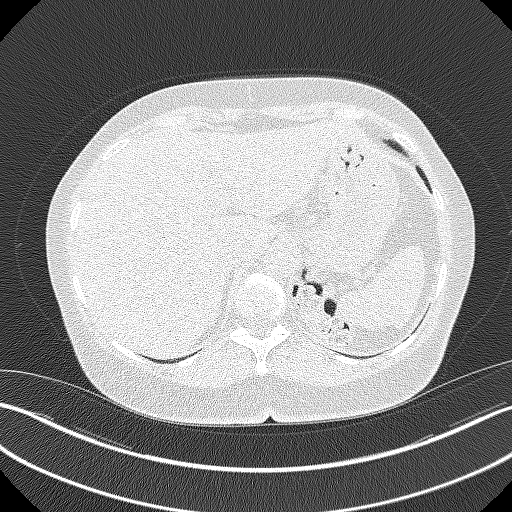
[im 65/286  lung]
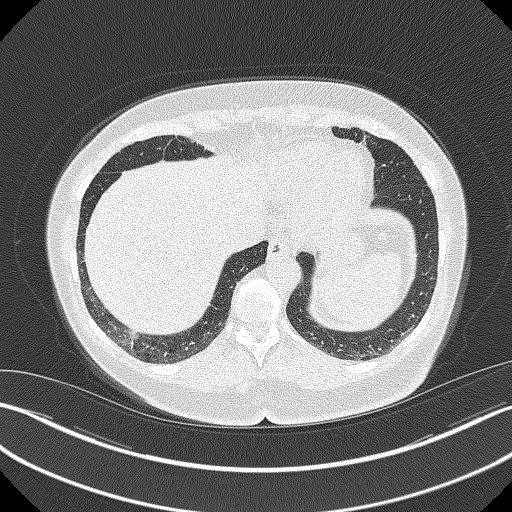
[im 91/286  lung]
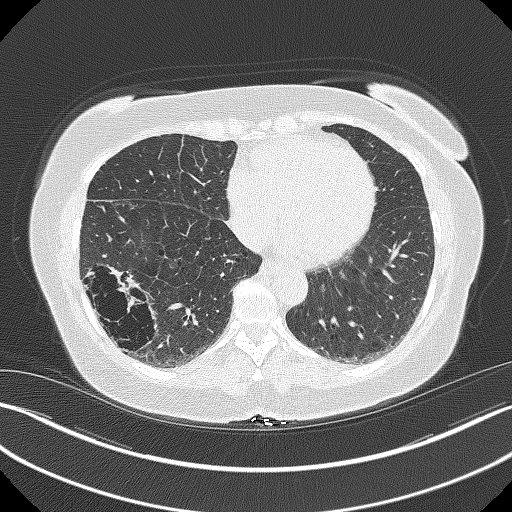
[im 117/286  mediastinal]
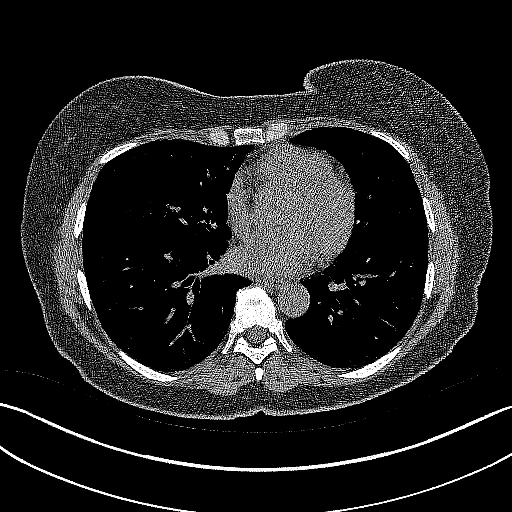
[im 117/286  lung]
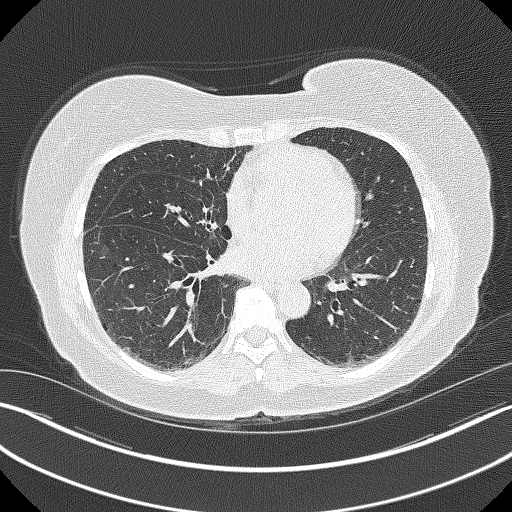
[im 143/286  lung]
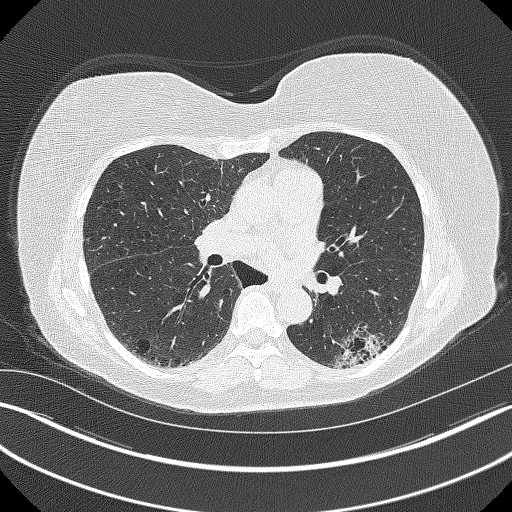
[im 169/286  lung]
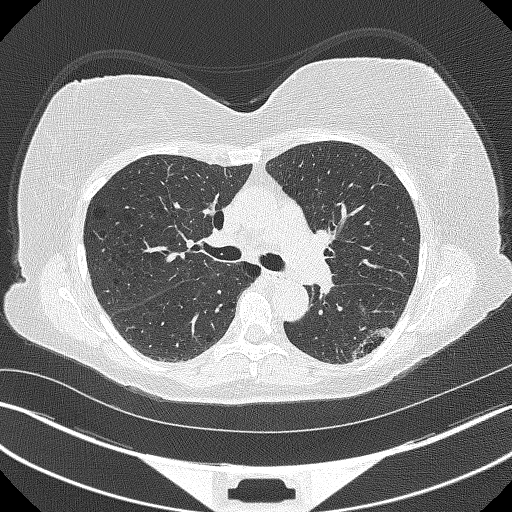
[im 195/286  lung]
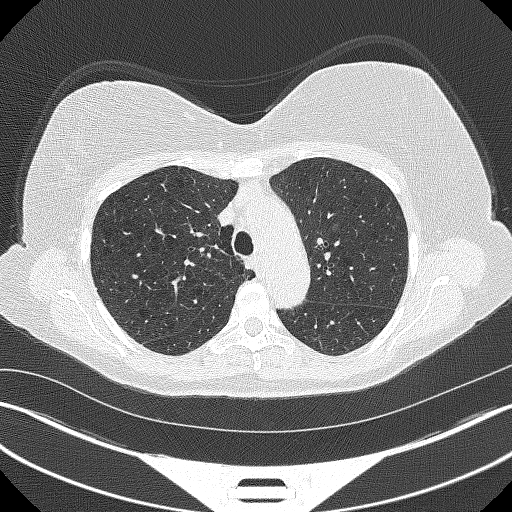
[im 221/286  mediastinal]
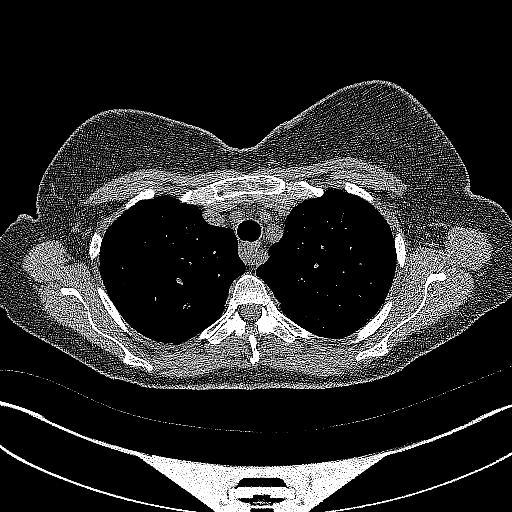
[im 221/286  lung]
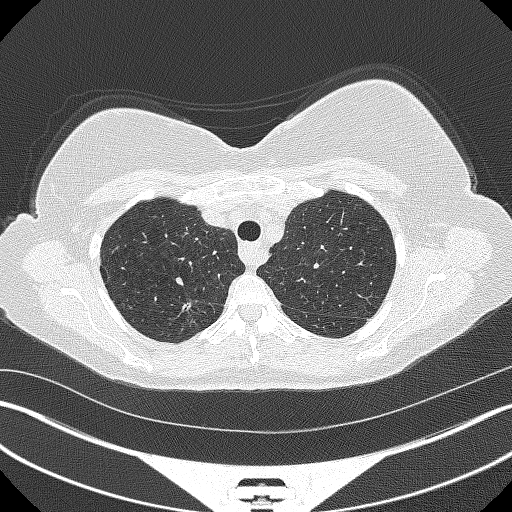
[im 247/286  lung]
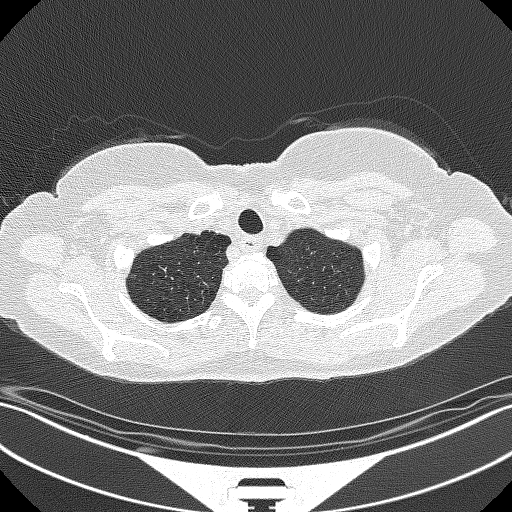
[im 273/286  lung]
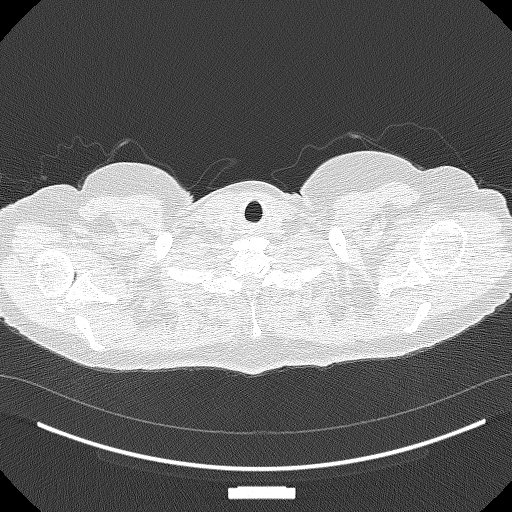

[14 of 40 positions shown; findings below may reference images not displayed]

FINDINGS: Cardiovascular: The heart size is normal. No substantial pericardial
effusion. Atherosclerotic calcification is noted in the wall of the
thoracic aorta.

Mediastinum/Nodes: No mediastinal lymphadenopathy. No evidence for
gross hilar lymphadenopathy although assessment is limited by the
lack of intravenous contrast on today's study. The esophagus has
normal imaging features. There is no axillary lymphadenopathy.

Lungs/Pleura: Centrilobular emphsyema noted. Patient has multiple
bilateral concerning lung lesions. Dominant lesions identified in
both lower lobes. Right lower lobe lesion measures approximately 7 x
4 cm, and is mainly cystic with soft tissue rim showing prominent
irregularity and nodularity. Prominent nodular component along the
medial margin of this lesion measures up to 9 mm.

Similar concerning irregular cystic, solid and sub solid nodule in
the posterior left lower lobe has a nodular soft tissue component
measuring up to 15 mm.

Additional scattered bilateral solid and sub solid pulmonary nodules
are evident.

No consolidation.  No pleural effusion.

Upper Abdomen: Incompletely visualized well-defined homogeneous
right suprarenal lesion measures up to 3.9 cm, likely a cyst.

Musculoskeletal: No worrisome lytic or sclerotic osseous
abnormality.
IMPRESSION: 1. Lung-RADS 4B, suspicious. Dominant irregular solid and sub solid
pulmonary lesions identified in the lower lobes bilaterally,
concerning for multifocal adenocarcinoma. Additional imaging
evaluation or consultation with Pulmonology or Thoracic Surgery
recommended.
2. Incompletely visualized homogeneous right suprarenal lesion,
likely a cyst.
3. Aortic Atherosclerosis (93WMB-19H.H) and Emphysema (93WMB-181.O).

These results will be called to the ordering clinician or
representative by the Radiologist Assistant, and communication
documented in the PACS or [REDACTED].

## 2022-03-17 ENCOUNTER — Other Ambulatory Visit: Payer: Medicare Other | Admitting: Hospice

## 2022-03-17 DIAGNOSIS — J449 Chronic obstructive pulmonary disease, unspecified: Secondary | ICD-10-CM | POA: Diagnosis not present

## 2022-03-17 DIAGNOSIS — Z515 Encounter for palliative care: Secondary | ICD-10-CM

## 2022-03-17 DIAGNOSIS — R0781 Pleurodynia: Secondary | ICD-10-CM | POA: Diagnosis not present

## 2022-03-17 DIAGNOSIS — C3432 Malignant neoplasm of lower lobe, left bronchus or lung: Secondary | ICD-10-CM

## 2022-03-17 NOTE — Progress Notes (Signed)
[facility] Consult Note Telephone: [phone]  Fax: [phone]  PATIENT NAME: [name] [name] [id number] [phone] (home)  DOB: [birth year] MRN: [id number]  PRIMARY CARE PROVIDER:    [name], MD,  [place] [address] [phone]  REFERRING PROVIDER:   Ezequiel [name], [place],  [place] [address] [phone]  RESPONSIBLE PARTY:   Self Contact Information     Name Relation Home Work [place] Daughter [phone]  [phone]   Cecily, [place] [phone]   Maxwell,Mildred Mother [phone]         TELEHEALTH VISIT STATEMENT Due to the COVID-19 crisis, this visit was done via telemedicine from my office and it was initiated and consent by this patient and or family.  I connected with patient OR PROXY by a telephone/video  and verified that I am speaking with the correct person. I discussed the limitations of evaluation and management by telemedicine. The patient expressed understanding and agreed to proceed. Palliative Care was asked to follow this patient to address advance care planning, complex medical decision making and goals of care clarification. This is the initial visit.     ASSESSMENT AND / RECOMMENDATIONS:   Advance Care Planning: Our advance care planning conversation included a discussion about:    The value and importance of advance care planning  Difference between Hospice and Palliative care Exploration of goals of care in the event of a sudden injury or illness  Identification and preparation of a healthcare agent  Review and updating or creation of an  advance directive document . Decision not to resuscitate or to de-escalate disease focused treatments due to poor prognosis.  CODE STATUS: Discussion on implications and ramifications of code status. Patient elects Do Not  Resuscitate  Goals of Care: Goals include to maximize quality of life and symptom management  I spent 16 minutes providing this initial consultation. More than 50% of the time in this consultation was spent on counseling patient and coordinating communication. --------------------------------------------------------------------------------------------------------------------------------------  Symptom Management/Plan: Left Lung Cancer: Completed chemo and radiation. Continue with Oncology surveillance appointments. Pain: Left rib side related to Cancer. Managed with Oxycodone 5 mg as need for pain, every 4 hours. She currently takes it once a day along with Tylenol 500mg  in the past week  Constipation: Miralax COPD: Continue Breztri and Albuterol. Avoid triggers; deep slow breathing encouraged.  Routine CBC CMP Follow up: Palliative care will continue to follow for complex medical decision making, advance care planning, and clarification of goals. Return 6 weeks or prn. Encouraged to call provider sooner with any concerns.   Family /Caregiver/Community Supports: Lives independently at home. Has family that visit and provide help as needed.   HOSPICE ELIGIBILITY/DIAGNOSIS: TBD  Chief Complaint: Initial Palliative care visit  HISTORY OF PRESENT ILLNESS:  [name] is a 66 y.o. year old female  with multiple morbidities requiring close monitoring and with high risk of complications and  mortality: Lung CA, chronic pain in left rib area related to Lung CA, COPD. History obtained from review of EMR, discussion with primary team, caregiver, family and/or [name].  Review and summarization of Epic records shows history from other than patient. Rest of 10 point ROS asked and negative.  Independent interpretation of tests and reviewed as needed, available labs, patient records, imaging, studies and related documents from the EMR.   PAST MEDICAL HISTORY:  Active Ambulatory Problems  Diagnosis Date Noted   Hot flashes, menopausal 02/28/2012   Anxiety state 02/28/2012   Restless legs 02/28/2012   Lumbar spine pain 01/09/2015   Arthritis of spine 03/27/2015   Healthcare maintenance 06/08/2015   Oral herpes 10/19/2015   HLD, secondary prevention, LDL goal <100 12/14/2017   Chronic bilateral low back pain without sciatica 02/08/2018   Chronic hand pain, right 03/26/2019   Nocturnal enuresis 06/23/2020   Lesion of left lung 07/28/2020   Lesion of right lung 07/28/2020   Costochondritis, acute 08/14/2020   Primary adenocarcinoma of lower lobe of left lung ([facility]) 08/18/2020   Bladder cancer ([facility]) 08/18/2020   Pulmonary emphysema ([facility]) 10/07/2020   Osteopenia after menopause 01/13/2021   Chronic left shoulder pain 04/29/2021   Mass of lower lobe of right lung 07/21/2021   Rib pain on left side 02/28/2022   Stage 3 severe COPD by GOLD classification ([facility]) 03/03/2022   Cancer related pain 03/11/2022   Resolved Ambulatory Problems    Diagnosis Date Noted   Metatarsalgia of both feet 02/28/2012   Acute lumbar back pain 03/30/2012   Swelling of finger, left 03/30/2012   Heavy smoker (more than 20 cigarettes per day) 03/30/2012   Health maintenance examination 03/30/2012   Abscess 04/23/2012   Hordeolum 10/03/2012   Distal radius fracture, right 10/20/2012   Depression 07/19/2013   Anxiety disorder, unspecified anxiety disorder type 12/12/2013   Itching 01/02/2014   Herpetic lesion 02/07/2014   Intertriginous candidiasis 02/07/2014   Dizziness 02/11/2015   Edema 02/11/2015   Fall due to wet surface 03/27/2015   Trichomonas infection 06/08/2015   Insomnia 10/30/2015   Body aches 11/11/2015   Eye pain 03/02/2016   Right arm pain 12/21/2016   Tachycardia 12/28/2016   Vaginal discharge 01/03/2017   Right leg pain 02/16/2017   Fatigue 10/30/2017   Right foot pain 04/02/2018   Epidermoid cyst 10/29/2018   Traumatic ecchymosis of toe of left foot 03/12/2019    Constipation 04/09/2019   Close exposure to COVID-19 virus 05/03/2019   Toe injury, right, initial encounter 10/22/2019   Elevated serum creatinine 10/22/2019   Leg cramping 11/13/2019   Menopausal vaginal dryness 11/13/2019   Organ donation 11/29/2019   Viral upper respiratory illness 01/28/2020   Abnormal CT lung screening 07/10/2020   Epigastric pain 11/16/2020   Sore throat 02/18/2021   Acute right ankle pain 05/28/2021   Left-sided thoracic back pain 06/04/2021   S/P bronchoscopy with biopsy    Otitis externa 09/22/2021   Skin pimple 10/04/2021   Past Medical History:  Diagnosis Date   Anxiety    Arthritis    Centrilobular emphysema ([facility])    DDD (degenerative disc disease), lumbar    DOE (dyspnea on exertion)    Emphysema lung ([facility])    Fibroid 2003   GERD (gastroesophageal reflux disease)    History of radiation therapy    Radiation-induced dermatitis    Seasonal allergies    Wears glasses     SOCIAL HX:  Social History   Tobacco Use   Smoking status: Former    Packs/day: 1.00    Years: 50.00    Total pack years: 50.00    Types: Cigarettes    Start date: 04/13/1975    Quit date: 07/14/2020    Years since quitting: 1.6   Smokeless tobacco: Never  Substance Use Topics   Alcohol use: Yes    Comment: Holidays only     FAMILY HX:  Family History  Problem Relation Age  of Onset   Hypertension Mother    Hyperlipidemia Mother    Dementia Father    Lung cancer Father    Parkinson's disease Father    Early death Sister 67       Drug overdose   Throat cancer Brother    Hypertension Daughter    Diabetes Daughter       ALLERGIES:  Allergies  Allergen Reactions   Codeine Nausea And Vomiting and Other (See Comments)    Hallucinations   Prednisone Other (See Comments)    'Made me feel funny inside my head"-pt cannot be specific      PERTINENT MEDICATIONS:  Outpatient Encounter Medications as of 03/17/2022  Medication Sig   acetaminophen (TYLENOL) 650 MG CR  tablet Take 650-1,300 mg by mouth every 8 (eight) hours as needed for pain.   albuterol (VENTOLIN HFA) 108 (90 Base) MCG/ACT inhaler USE 2 INHALATIONS BY MOUTH  INTO THE LUNGS EVERY 6  HOURS AS NEEDED FOR  SHORTNESS OF BREATH OR  WHEEZING   ALPRAZolam (XANAX) 0.5 MG tablet Take 1 tablet (0.5 mg total) by mouth at bedtime as needed for up to 5 doses for anxiety.   atorvastatin (LIPITOR) 40 MG tablet Take 1 tablet (40 mg total) by mouth at bedtime.   azelastine (OPTIVAR) 0.05 % ophthalmic solution Place 1 drop into both eyes 2 (two) times daily as needed for allergies.   baclofen (LIORESAL) 10 MG tablet Take 0.5 tablets (5 mg total) by mouth 3 (three) times daily. (Patient taking differently: Take 5 mg by mouth 3 (three) times daily as needed for muscle spasms.)   Budeson-Glycopyrrol-Formoterol (BREZTRI AEROSPHERE) 160-9-4.8 MCG/ACT AERO Inhale 2 puffs into the lungs in the morning and at bedtime.   carbamide peroxide (DEBROX) 6.5 % OTIC solution Place 5 drops into both ears 2 (two) times daily. (Patient taking differently: Place 5 drops into both ears 2 (two) times daily as needed (ear wax build up).)   chlorhexidine (PERIDEX) 0.12 % solution Use as directed 15 mLs in the mouth or throat in the morning.   ciprofloxacin-dexamethasone (CIPRODEX) OTIC suspension Place 4 drops into both ears 2 (two) times daily. For 5 days (Patient not taking: Reported on 03/03/2022)   clobetasol cream (TEMOVATE) 4.08 % Apply 1 application topically 2 (two) times daily as needed. (Patient not taking: Reported on 03/07/2022)   diclofenac Sodium (VOLTAREN) 1 % GEL Apply 2 g topically 4 (four) times daily. Apply to painful area of ribs. Need to use regularly for relief. (Patient not taking: Reported on 03/03/2022)   fluticasone (FLONASE) 50 MCG/ACT nasal spray Place 2 sprays into both nostrils daily. (Patient taking differently: Place 2 sprays into both nostrils daily as needed (allergies).)   gabapentin (NEURONTIN) 300 MG capsule  Take 1 capsule (300 mg total) by mouth at bedtime. After 4 days increase to 2 capsules (600 mg total) by mouth at bedtime. (Patient taking differently: Take 600 mg by mouth at bedtime as needed (cramps).)   hydrOXYzine (ATARAX) 50 MG tablet Take 1 tablet (50 mg total) by mouth at bedtime as needed for anxiety or itching (sleep).   ibuprofen (ADVIL) 800 MG tablet Take 1 tablet (800 mg total) by mouth every 8 (eight) hours as needed. (Patient not taking: Reported on 03/03/2022)   MELATONIN GUMMIES PO Take 10 mg by mouth at bedtime.   oxyCODONE (OXY IR/ROXICODONE) 5 MG immediate release tablet Take 1 tablet (5 mg total) by mouth every 4 (four) hours as needed for severe pain.  pantoprazole (PROTONIX) 40 MG tablet Take 1 tablet (40 mg total) by mouth daily as needed (acid indigestion/heartburn.).   No facility-administered encounter medications on file as of 03/17/2022.     Thank you for the opportunity to participate in the care of Ms. Kassab.  The palliative care team will continue to follow. Please call our office at [phone] if we can be of additional assistance.   Note: Portions of this note were generated with [employer]. Dictation errors may occur despite best attempts at proofreading.  Teodoro Spray, NP

## 2022-03-18 ENCOUNTER — Telehealth: Payer: Self-pay

## 2022-03-18 NOTE — Telephone Encounter (Signed)
Patient calls nurse line regarding green sputum production. Patient states that she is coughing up green sputum around 3-4 times daily. States this has been "going on for a while now," however, has increased and is not going away.   She denies fever, sore throat, nasal congestion, SHOB, or chest pain.   Scheduled patient appointment on Monday afternoon for evaluation.   Talbot Grumbling, RN

## 2022-03-21 ENCOUNTER — Ambulatory Visit (INDEPENDENT_AMBULATORY_CARE_PROVIDER_SITE_OTHER): Payer: Medicare Other | Admitting: Student

## 2022-03-21 VITALS — BP 131/77 | HR 99 | Wt 188.0 lb

## 2022-03-21 DIAGNOSIS — J441 Chronic obstructive pulmonary disease with (acute) exacerbation: Secondary | ICD-10-CM | POA: Insufficient documentation

## 2022-03-21 DIAGNOSIS — G8929 Other chronic pain: Secondary | ICD-10-CM | POA: Diagnosis not present

## 2022-03-21 DIAGNOSIS — R0781 Pleurodynia: Secondary | ICD-10-CM

## 2022-03-21 DIAGNOSIS — M25512 Pain in left shoulder: Secondary | ICD-10-CM | POA: Diagnosis not present

## 2022-03-21 DIAGNOSIS — J449 Chronic obstructive pulmonary disease, unspecified: Secondary | ICD-10-CM | POA: Diagnosis not present

## 2022-03-21 DIAGNOSIS — F411 Generalized anxiety disorder: Secondary | ICD-10-CM

## 2022-03-21 MED ORDER — DEXAMETHASONE 4 MG PO TABS
10.0000 mg | ORAL_TABLET | Freq: Once | ORAL | 0 refills | Status: AC
Start: 2022-03-23 — End: 2022-03-23

## 2022-03-21 MED ORDER — OXYCODONE HCL 10 MG PO TABS: 10.0000 mg | ORAL_TABLET | Freq: Four times a day (QID) | ORAL | 0 refills | Status: DC | PRN

## 2022-03-21 MED ORDER — AZITHROMYCIN 250 MG PO TABS: ORAL_TABLET | ORAL | 0 refills | Status: DC

## 2022-03-21 MED ORDER — DEXAMETHASONE SODIUM PHOSPHATE 10 MG/ML IJ SOLN
10.0000 mg | Freq: Once | INTRAMUSCULAR | Status: AC
  Administered 2022-03-21: 10 mg via INTRAMUSCULAR

## 2022-03-21 NOTE — Assessment & Plan Note (Addendum)
-  Azithro 500mg  x1 day followed by 250mg  x4 days -Pt with prednison allergy, therefore prednisone not an option -IM Decadron 10mg  in clinic today, Rx sent for Decadron 10mg  to be taken on 7/12

## 2022-03-21 NOTE — Progress Notes (Addendum)
  Date of Visit: 03/21/2022   HPI:  [name] is a 66 y.o. female with a past medical history of COPD, pulmonary emphysema, anxiety, and pulmonary adenocarcinoma who presents with:  L sided back and chest wall pain Pain started 6 months ago with the start of radiation. Pain occurs daily. It starts at her left mid back below her scapula and radiates to her left side, left ribs, and mid-chest. Pain is sharp. Pain will wake her up and is 9-10/10. She has tried heating pads, cold packs, tylenol, gabapentin, lidocaine patch, oxycodone, and tramadol without pain relief. Tramadol caused her to vomit. Previously noted by her pulmonologist to have some scarring in that region.   Cough Cough began 1 month ago. It is productive with thick green sputum. Cough occurs about every other day and resolves when sputum comes out. She has SOB at baseline and it has not worsened with the new cough. She denies fever, chest pain, and any sick contacts. She is up to date on her vaccines.  Anxiety Patient requests a refill on her Xanax. Given concern for safety of Xanax long-term, patient amenable to continuing Hydroxyzine PRN.  PHYSICAL EXAM: BP 131/77   Pulse 99   Wt 188 lb (85.3 kg)   SpO2 96%   BMI 27.76 kg/m  Gen: Well-appearing female in no acute distress. Pulm: Diminished breath sounds bilaterally. Coarse breath sounds bilaterally at lung bases. No wheezing or crackles. CV: Normal rate and rhythm. No murmurs or gallops. MSK: Left upper back tender to palpation.  ASSESSMENT/PLAN: COPD with acute exacerbation (HCC) -Azithro 500mg  x1 day followed by 250mg  x4 days -Pt with prednison allergy, therefore prednisone not an option -IM Decadron 10mg  in clinic today, Rx sent for Decadron 10mg  to be taken on 7/12  Chronic left shoulder pain Seems to be cancer-related. Anticipate will be difficult to treat. Multi-modal therapy warranted.  -Continue to use Lidocaine patch and Gabapentin QHS -Increase  oxycodone to 10mg  q6hrs PRN -PCP follow-up in 2-4 weeks  Anxiety state -Will not refill Xanax at this time -Continue Hydroxyzine PRN -Can consider SSRI if anxious state persists  Leonette Nutting, [place]  I was personally present and performed or re-performed the history, physical exam and medical decision making activities of this service and have verified that the service and findings are accurately documented in the student's note.  Pearla Dubonnet, MD                  03/21/2022, 4:09 PM

## 2022-03-21 NOTE — Assessment & Plan Note (Addendum)
Seems to be cancer-related. Anticipate will be difficult to treat. Multi-modal therapy warranted.  -Continue to use Lidocaine patch and Gabapentin QHS -Increase oxycodone to 10mg  q6hrs PRN -PCP follow-up in 2-4 weeks

## 2022-03-21 NOTE — Assessment & Plan Note (Addendum)
-  Will not refill Xanax at this time -Continue Hydroxyzine PRN -Can consider SSRI if anxious state persists

## 2022-03-21 NOTE — Patient Instructions (Addendum)
[name],  It is so nice to meet you!  I am so sorry to hear about your coughing and sputum production.  It does sound like you are having a COPD exacerbation.  We will treat this with 2 medicines: Dexamethasone, which is a steroid.  We are giving you a shot today and then sending in some pills to your pharmacy, you should take these pills on Wednesday, 7/12.  We will also treat you with an antibiotic called azithromycin.  You will take 2 tablets (500 mg total) 1 day followed by 1 tablet (250 mg) for the next 4 days. For your back pain, we are going to have to treat this with multiple modes of pain medicine given the nature of cancer related pain.  I want you to continue using the lidocaine patch and the gabapentin that you are taking at night.  Today, we can increase your dose of oxycodone from 5 mg to 10 mg.  Try to use this no more often than about every 8 hours or so.  If you start to notice that you are getting worse instead of better, you can always come back to see [place] and we can continue to troubleshoot.  There is some room to go up with your gabapentin if you think that it is helpful at all.  Lets see where this gets you and have you follow-up with [name] in a few weeks.  Pearla Dubonnet, MD

## 2022-03-24 ENCOUNTER — Telehealth: Payer: Self-pay | Admitting: *Deleted

## 2022-03-24 DIAGNOSIS — R0781 Pleurodynia: Secondary | ICD-10-CM

## 2022-03-24 NOTE — Telephone Encounter (Signed)
Pt calls because the oxycodone 5mg  was making her itch, but the increase 10mg  made her itch so bad last night that she was unable to sleep.  Does not want to take it anymore.  Message to Dr. Joelyn Oms.  If the patient needs:   An appointment - route response to [facility] admin  A callback from clinic staff - route response to your team   Only route to RN Team: form completions or if RN team directly requests response sent to them   Christen Bame, CMA

## 2022-03-25 ENCOUNTER — Other Ambulatory Visit: Payer: Self-pay | Admitting: Student

## 2022-03-25 DIAGNOSIS — G893 Neoplasm related pain (acute) (chronic): Secondary | ICD-10-CM

## 2022-03-25 MED ORDER — OXYCODONE HCL 10 MG PO TABS: 5.0000 mg | ORAL_TABLET | Freq: Four times a day (QID) | ORAL | 0 refills | Status: DC | PRN

## 2022-03-25 MED ORDER — DULOXETINE HCL 30 MG PO CPEP: 30.0000 mg | ORAL_CAPSULE | Freq: Every day | ORAL | 3 refills | Status: DC

## 2022-03-25 NOTE — Telephone Encounter (Signed)
Called patient to discuss cancer pain management. She finds the itching associated with oxycodone 10mg  to be intolerable. She can tolerate the 5mg  dose with only minor itching.  - Will trial duloxetine 30mg  daily, if helpful could consider increasing dose at PCP follow-up in two weeks - Decrease oxycodone dose back to 5mg  q6h PRN  Pearla Dubonnet, MD

## 2022-03-31 ENCOUNTER — Ambulatory Visit: Payer: Medicare Other | Admitting: Pharmacist

## 2022-04-04 ENCOUNTER — Ambulatory Visit: Payer: Medicare Other | Admitting: Nurse Practitioner

## 2022-04-04 ENCOUNTER — Encounter: Payer: Self-pay | Admitting: Nurse Practitioner

## 2022-04-04 ENCOUNTER — Ambulatory Visit (INDEPENDENT_AMBULATORY_CARE_PROVIDER_SITE_OTHER): Payer: Medicare Other | Admitting: Nurse Practitioner

## 2022-04-04 ENCOUNTER — Ambulatory Visit (INDEPENDENT_AMBULATORY_CARE_PROVIDER_SITE_OTHER): Payer: Medicare Other

## 2022-04-04 VITALS — BP 110/70 | HR 61 | Temp 98.1°F | Ht 69.0 in | Wt 188.0 lb

## 2022-04-04 DIAGNOSIS — R059 Cough, unspecified: Secondary | ICD-10-CM | POA: Diagnosis not present

## 2022-04-04 DIAGNOSIS — R0781 Pleurodynia: Secondary | ICD-10-CM

## 2022-04-04 DIAGNOSIS — C3432 Malignant neoplasm of lower lobe, left bronchus or lung: Secondary | ICD-10-CM | POA: Diagnosis not present

## 2022-04-04 DIAGNOSIS — J449 Chronic obstructive pulmonary disease, unspecified: Secondary | ICD-10-CM

## 2022-04-04 NOTE — Assessment & Plan Note (Addendum)
AECOPD appears to have resolved. Clinically improved. She still has an occasional productive cough, likely chronic bronchitis or allergy related. We will check CXR today to ensure no evidence of superimposed infection. Advised she start OTC antihistamine for allergy symptoms/trigger prevention. Otherwise, continue triple therapy regimen with Breztri and prn albuterol.   Patient Instructions  Continue Breztri 2 puffs Twice daily. Brush tongue and rinse mouth afterwards.  Continue Albuterol inhaler 2 puffs every 6 hours as needed for shortness of breath or wheezing. Notify if symptoms persist despite rescue inhaler/neb use. Continue flonase nasal spray 2 sprays each nostril daily Try over the counter antihistamine such as Zyrtec, Claritin, Allegra or Xyzal for allergies    Follow up with Dr. Sondra Come as scheduled after CT scan   Chest x ray today. I will notify you of any abnormal results.    Follow up in 3 months with [name] or Alanson Aly. If symptoms do not improve or worsen, please contact office for sooner follow up or seek emergency care.

## 2022-04-04 NOTE — Progress Notes (Unsigned)
@Patient  ID: [name], female    DOB: March 03, 1956, 66 y.o.   MRN: [id number]  Chief Complaint  Patient presents with   Follow-up    She is doing well and has no concerns today.     Referring provider: [name], MD  HPI: 66 year old female, former smoker (50 pack years) followed for COPD Gold 3 with centrilobular emphysema and adenocarcinoma of the left lung.  She is a patient of Dr. Juline [name] and last seen in office 03/05/2022.  Past medical history significant for bladder cancer status postresection February 2022, anxiety, RLS, HLD, chronic back pain.  She was initially seen by her PCP and encouraged to have a lung cancer screening CT due to her smoking history, which was completed in October 2021.  The CT scan revealed bilateral lower lobe cystic peripheral based lesions, both greater than 4 cm in size concerning for multifocal adenocarcinoma.  Patient had no respiratory symptoms at this time.  She underwent bronchoscopy in December 2021; pathology with adenocarcinoma of left lower lobe.  Inconclusive biopsy of the right lower lobe; however given irregular cystic shaped lesion concerned this was adenocarcinoma as well. PET scan was relatively equivocal with low to moderate SUV uptake.  She was referred to cardiothoracic surgery who deemed her not a good surgical candidate due to poor lung function.  She made the decision to undergo radiation to the left lower lobe adenocarcinoma and monitor the right side with consideration in the future for empiric treatment as well.  TEST/EVENTS:  02/22/2022 CT chest without contrast: There is atherosclerosis.  No identified LAD.  There is centrilobular emphysema and scarring in the right lung apex.  There is stable scarring in the right middle lobe posteriorly.  There is an unchanged mixed density nodule in the right lower lobe with large air cyst elements and thickened interstitial elements especially along its margins, total size is 7.8 x 5.4 cm,  not significantly changed from prior exam but enlarged when compared to 01/06/2021.  Stable mild subpleural nodularity in the right lower lobe.  There is a subpleural nodule medially in the left upper lobe which measures 0.7 x 0.7 cm and has been slowly enlarging over the last 2 years.  Previously 0.3 x 0.2.  There is stable scarring in the left lower lobe with peripheral triangular opacity, likely therapy related. 03/02/2022 CXR 2 view: No acute process.  There is mild atelectasis or scarring at the lung bases.  There is a questionable cortical irregularity right involving the T8 rib on the left  03/03/2022: OV with Laura Petro NP for persistent left-sided pain.  She has had ongoing issues with this for the last 7 months.  She has discussed this with numerous providers and been tried on different opioids, gabapentin and topical treatments with minimal relief.  She did discuss this with Dr. Sondra [name] who ordered a chest x-ray that showed a deformity to the left T8 rib.  She went and had dedicated rib series today and has yet to receive results.  We also discussed her progressive DOE over the last few months.  Cough unchanged from baseline. She is not on any maintenance regimen for COPD.  She denies any wheezing, hemoptysis, weight loss, anorexia.  She does use her albuterol often. Started her on Breztri - provided with samples. CT with slight increase in LUL nodule; RLL lesion is stable from April but increased in size when compared to year prior. Advised I would discuss with [name] and determine appropriate next steps.  03/05/2022: OV with [name]. Very anxious about the RLL cystic lesion - advised that upon his review, lesion looks very similar in size. She wanted to discuss with Dr. Sondra [name] as well - message sent to him. Continue triple therapy for COPD.   04/04/2022: Today - follow up Patient presents today for follow up after being treated for possible AECOPD by her PCP on 7/10. She reported a productive cough x 1  month with green sputum. Treated with decadron and z pack. Today, she reports that her breathing is better. She still has an occasional productive cough; does not occur daily. Sometimes with green mucus but usually dry. She has allergies, with nasal congestion, sneezing and itchy eyes, which she takes flonase and an antihistamine eye drop for. No OTC allergy meds. She denies any wheezing, fevers, night sweats, hemoptysis, leg swelling, orthopnea. Her rib pain is better than previously. Imaging did not reveal any fracture; suspected to be related to radiation. She has a follow up CT scan in September; although she believes she has one scheduled tomorrow as well. She continues on [place]. Rarely uses albuterol.   Allergies  Allergen Reactions   Codeine Nausea And Vomiting and Other (See Comments)    Hallucinations   Prednisone Other (See Comments)    'Made me feel funny inside my head"-pt cannot be specific    Immunization History  Administered Date(s) Administered   Fluad Quad(high Dose 65+) 06/18/2021   Influenza,inj,Quad PF,6+ Mos 07/19/2013, 07/17/2014, 06/08/2015, 10/07/2016, 09/13/2017, 06/27/2018, 06/14/2019, 06/17/2020   PFIZER Comirnaty(Gray Top)Covid-19 Tri-Sucrose Vaccine 04/26/2021   PFIZER(Purple Top)SARS-COV-2 Vaccination 12/05/2019, 12/30/2019, 06/22/2020   PPD Test 10/12/2012   Pneumococcal Conjugate-13 06/23/2020   Pneumococcal Polysaccharide-23 07/17/2014   Tdap 03/30/2012   Zoster Recombinat (Shingrix) 12/09/2017, 04/03/2018    Past Medical History:  Diagnosis Date   Abnormal CT lung screening 07/10/2020   Anxiety    Arthritis    Bladder cancer ([place]) 09/2020   urologist--- dr [name], incidental finding on PET scan 12/ 2021,  s/p TURBT 09-18-2020 high grade papillary urothelial    Centrilobular emphysema ([place])    Close exposure to COVID-19 virus 05/03/2019   Constipation 04/09/2019   DDD (degenerative disc disease), lumbar    Depression    DOE (dyspnea on exertion)     10-29-2020  per pt can do house work without sob, when walks/ stairs stops frequently  (stated recovers quickly when stops)   Emphysema lung ([place])    Epidermoid cyst 10/29/2018   Recurrent. Located on left hip at iliac crest 2" anterior to midaxillary line. Removed 2013, 2020, 2022.    Epigastric pain 11/16/2020   Fibroid 2003   GERD (gastroesophageal reflux disease)    History of radiation therapy    Left lung- 10/06/20-10/16/20- Dr. Gery Pray   Leg cramping 11/13/2019   Menopausal vaginal dryness 11/13/2019   Primary adenocarcinoma of lower lobe of left lung ([place]) 07/2020   oncologist--- dr Julien Nordmann---  dx 11/ 2021 bilateral lower lobe masses,  s/p bronchoscopy w/ bx's 07-28-2020 malignancy cells on left ;  completed SBRT bilateral lower lung 10-16-2020 5 fractions   Radiation-induced dermatitis    on 10-29-2020 pt complaint stomach and chest areas,  completed SBRT 10-16-2020   Seasonal allergies    Stage 3 severe COPD by GOLD classification [facility])    pulmonologist--- dr [name],  using anoro inhaler daily   Wears glasses     Tobacco History: Social History   Tobacco Use  Smoking Status Former   Packs/day: 1.00  Years: 50.00   Total pack years: 50.00   Types: Cigarettes   Start date: 04/13/1975   Quit date: 07/14/2020   Years since quitting: 1.7  Smokeless Tobacco Never   Counseling given: Not Answered   Outpatient Medications Prior to Visit  Medication Sig Dispense Refill   acetaminophen (TYLENOL) 650 MG CR tablet Take 650-1,300 mg by mouth every 8 (eight) hours as needed for pain.     albuterol (VENTOLIN HFA) 108 (90 Base) MCG/ACT inhaler USE 2 INHALATIONS BY MOUTH  INTO THE LUNGS EVERY 6  HOURS AS NEEDED FOR  SHORTNESS OF BREATH OR  WHEEZING 26.8 g 2   atorvastatin (LIPITOR) 40 MG tablet Take 1 tablet (40 mg total) by mouth at bedtime. 90 tablet 3   azelastine (OPTIVAR) 0.05 % ophthalmic solution Place 1 drop into both eyes 2 (two) times daily as needed for allergies.      baclofen (LIORESAL) 10 MG tablet Take 0.5 tablets (5 mg total) by mouth 3 (three) times daily. (Patient taking differently: Take 5 mg by mouth 3 (three) times daily as needed for muscle spasms.) 30 each 0   Budeson-Glycopyrrol-Formoterol (BREZTRI AEROSPHERE) 160-9-4.8 MCG/ACT AERO Inhale 2 puffs into the lungs in the morning and at bedtime. 10.7 g 5   carbamide peroxide (DEBROX) 6.5 % OTIC solution Place 5 drops into both ears 2 (two) times daily. 15 mL 0   chlorhexidine (PERIDEX) 0.12 % solution Use as directed 15 mLs in the mouth or throat in the morning.     clobetasol cream (TEMOVATE) 1.61 % Apply 1 application topically 2 (two) times daily as needed. 30 g 2   diclofenac Sodium (VOLTAREN) 1 % GEL Apply 2 g topically 4 (four) times daily. Apply to painful area of ribs. Need to use regularly for relief. 100 g 2   DULoxetine (CYMBALTA) 30 MG capsule Take 1 capsule (30 mg total) by mouth daily. 30 capsule 3   fluticasone (FLONASE) 50 MCG/ACT nasal spray Place 2 sprays into both nostrils daily. (Patient taking differently: Place 2 sprays into both nostrils daily as needed (allergies).) 16 g 6   gabapentin (NEURONTIN) 300 MG capsule Take 1 capsule (300 mg total) by mouth at bedtime. After 4 days increase to 2 capsules (600 mg total) by mouth at bedtime. (Patient taking differently: Take 600 mg by mouth at bedtime as needed (cramps).) 60 capsule 1   hydrOXYzine (ATARAX) 50 MG tablet Take 1 tablet (50 mg total) by mouth at bedtime as needed for anxiety or itching (sleep). 90 tablet 2   ibuprofen (ADVIL) 800 MG tablet Take 1 tablet (800 mg total) by mouth every 8 (eight) hours as needed. 30 tablet 0   MELATONIN GUMMIES PO Take 10 mg by mouth at bedtime.     Oxycodone HCl 10 MG TABS Take 0.5 tablets (5 mg total) by mouth every 6 (six) hours as needed (For cancer pain). 28 tablet 0   pantoprazole (PROTONIX) 40 MG tablet Take 1 tablet (40 mg total) by mouth daily as needed (acid indigestion/heartburn.). 30  tablet 2   azithromycin (ZITHROMAX) 250 MG tablet Take 2 tablets (500mg  total) on day one followed by 1 tablet (250mg  total) for four days 6 tablet 0   ciprofloxacin-dexamethasone (CIPRODEX) OTIC suspension Place 4 drops into both ears 2 (two) times daily. For 5 days (Patient not taking: Reported on 04/04/2022) 7.5 mL 0   No facility-administered medications prior to visit.     Review of Systems:   Constitutional: No weight loss  or gain, night sweats, fevers, chills, fatigue, or lassitude. HEENT: No headaches, difficulty swallowing, tooth/dental problems, or sore throat, ear ache. + nasal congestion, sneezing, occasional itchy eyes CV:  No chest pain, orthopnea, PND, swelling in lower extremities, anasarca, dizziness, palpitations, syncope Resp: +shortness of breath with exertion (improved), chronic cough (occasionally productive). No excess mucus or change in color of mucus.. No hemoptysis. No wheezing.  No chest wall deformity Skin: No rash, lesions, ulcerations MSK:  No joint pain or swelling.  No decreased range of motion.  No back pain. +left rib pain (improved) Neuro: No dizziness or lightheadedness.  Psych: No depression or anxiety. Mood stable.     Physical Exam:  BP 110/70 (BP Location: Left Arm, Cuff Size: Normal)   Pulse 61   Temp 98.1 F (36.7 C) (Oral)   Ht 5\' 9"  (1.753 m)   Wt 188 lb (85.3 kg)   SpO2 96%   BMI 27.76 kg/m   GEN: Pleasant, interactive, well-appearing; in no acute distress. HEENT:  Normocephalic and atraumatic. PERRLA. Sclera white. Nasal turbinates pink, moist and patent bilaterally. No rhinorrhea present. Oropharynx pink and moist, without exudate or edema. No lesions, ulcerations, or postnasal drip.  NECK:  Supple w/ fair ROM. No JVD present. Normal carotid impulses w/o bruits. Thyroid symmetrical with no goiter or nodules palpated. No lymphadenopathy.   CV: RRR, no m/r/g, no peripheral edema. Pulses intact, +2 bilaterally. No cyanosis, pallor or  clubbing. PULMONARY:  Unlabored, regular breathing. Clear bilaterally A&P w/o wheezes/rales/rhonchi. No accessory muscle use. No dullness to percussion. GI: BS present and normoactive. Soft, non-tender to palpation. No organomegaly or masses detected. No CVA tenderness. MSK: No erythema, warmth. Cap refil <2 sec all extrem. No deformities or joint swelling noted.  Neuro: A/Ox3. No focal deficits noted.   Skin: Warm, no lesions or rashe Psych: Normal affect and behavior. Judgement and thought content appropriate.     Lab Results:  CBC    Component Value Date/Time   WBC 6.6 08/17/2021 0715   RBC 4.99 08/17/2021 0715   HGB 13.9 08/17/2021 0715   HGB 13.1 08/18/2020 1348   HGB 14.1 06/23/2020 1237   HCT 43.3 08/17/2021 0715   HCT 43.6 06/23/2020 1237   PLT 366 08/17/2021 0715   PLT 360 08/18/2020 1348   PLT 366 06/23/2020 1237   MCV 86.8 08/17/2021 0715   MCV 86 06/23/2020 1237   MCH 27.9 08/17/2021 0715   MCHC 32.1 08/17/2021 0715   RDW 13.6 08/17/2021 0715   RDW 13.2 06/23/2020 1237   LYMPHSABS 1.3 12/17/2020 1029   LYMPHSABS 2.2 06/23/2020 1237   MONOABS 0.7 12/17/2020 1029   EOSABS 0.2 12/17/2020 1029   EOSABS 0.2 06/23/2020 1237   BASOSABS 0.1 12/17/2020 1029   BASOSABS 0.1 06/23/2020 1237    BMET    Component Value Date/Time   NA 138 12/17/2020 1029   NA 142 06/23/2020 1237   K 3.8 12/17/2020 1029   CL 104 12/17/2020 1029   CO2 26 12/17/2020 1029   GLUCOSE 90 12/17/2020 1029   BUN 15 12/17/2020 1029   BUN 19 06/23/2020 1237   CREATININE 1.06 (H) 12/17/2020 1029   CREATININE 1.05 (H) 08/18/2020 1348   CREATININE 0.95 02/10/2015 1526   CALCIUM 9.0 12/17/2020 1029   GFRNONAA 58 (L) 12/17/2020 1029   GFRNONAA 59 (L) 08/18/2020 1348   GFRAA 69 06/23/2020 1237    BNP    Component Value Date/Time   BNP 66.1 12/17/2020 1029  Imaging:  DG Chest 2 View  Result Date: 04/04/2022 CLINICAL DATA:  Productive cough EXAM: CHEST - 2 VIEW COMPARISON:   03/02/2022 FINDINGS: Cardiac size is within normal limits. There are no signs of alveolar pulmonary edema. Linear densities are seen in left parahilar region and medial left lower lung field. Crowding of markings in right lower lung field has not changed. There is no pleural effusion or pneumothorax. IMPRESSION: Increased markings are seen in left parahilar region which may suggest crowding of normal bronchovascular structures or subsegmental atelectasis/pneumonia. Linear densities in the lower lung fields most likely suggest scarring or subsegmental atelectasis. Electronically Signed   By: Elmer Picker M.D.   On: 04/04/2022 17:20    dexamethasone (DECADRON) injection 10 mg     Date Action Dose Route User   03/21/2022 1553 Given 10 mg Intramuscular (Left Deltoid) Ozella Almond, CMA          Latest Ref Rng & Units 08/03/2020    9:11 AM  PFT Results  FVC-Pre L 2.03   FVC-Predicted Pre % 67   FVC-Post L 2.15   FVC-Predicted Post % 71   Pre FEV1/FVC % % 67   Post FEV1/FCV % % 57   FEV1-Pre L 1.37   FEV1-Predicted Pre % 58   FEV1-Post L 1.22   DLCO uncorrected ml/min/mmHg 9.46   DLCO UNC% % 41   DLCO corrected ml/min/mmHg 9.26   DLCO COR %Predicted % 40   DLVA Predicted % 57   TLC L 5.13   TLC % Predicted % 90   RV % Predicted % 133     No results found for: "NITRICOXIDE"      Assessment & Plan:   Stage 3 severe COPD by GOLD classification ([place]) AECOPD appears to have resolved. Clinically improved. She still has an occasional productive cough, likely chronic bronchitis or allergy related. We will check CXR today to ensure no evidence of superimposed infection. Advised she start OTC antihistamine for allergy symptoms/trigger prevention. Otherwise, continue triple therapy regimen with Breztri and prn albuterol.   Patient Instructions  Continue Breztri 2 puffs Twice daily. Brush tongue and rinse mouth afterwards.  Continue Albuterol inhaler 2 puffs every 6 hours as  needed for shortness of breath or wheezing. Notify if symptoms persist despite rescue inhaler/neb use. Continue flonase nasal spray 2 sprays each nostril daily Try over the counter antihistamine such as Zyrtec, Claritin, Allegra or Xyzal for allergies    Follow up with Dr. Sondra [name] as scheduled after CT scan   Chest x ray today. I will notify you of any abnormal results.    Follow up in 3 months with [name] or Alanson Aly. If symptoms do not improve or worsen, please contact office for sooner follow up or seek emergency care.    Primary adenocarcinoma of lower lobe of left lung (HCC) Stable on recent imaging. RLL cystic lesion was reviewed by [name] and noted to be stable in size. She has upcoming CT chest and follow up with Dr. Sondra [name] in September; although, she thinks she also has a scan tomorrow but I cannot see this in Epic. Recommended she call Dr. Clabe Seal office to clarify.   Rib pain on left side Most likely from radiation therapy from adenocarcinoma of LLL. She is currently being managed by her PCP and was referred to palliative care as well. Pain is much better controlled today.    I spent 35 minutes of dedicated to the care of this patient on the  date of this encounter to include pre-visit review of records, face-to-face time with the patient discussing conditions above, post visit ordering of testing, clinical documentation with the electronic health record, making appropriate referrals as documented, and communicating necessary findings to members of the patients care team.  Clayton Bibles, NP 04/05/2022  Pt aware and understands NP's role.

## 2022-04-04 NOTE — Patient Instructions (Addendum)
Continue Breztri 2 puffs Twice daily. Brush tongue and rinse mouth afterwards.  Continue Albuterol inhaler 2 puffs every 6 hours as needed for shortness of breath or wheezing. Notify if symptoms persist despite rescue inhaler/neb use. Continue flonase nasal spray 2 sprays each nostril daily Try over the counter antihistamine such as Zyrtec, Claritin, Allegra or Xyzal for allergies    Follow up with Dr. Sondra Come as scheduled after CT scan   Chest x ray today. I will notify you of any abnormal results.    Follow up in 3 months with [name] or Alanson Aly. If symptoms do not improve or worsen, please contact office for sooner follow up or seek emergency care.

## 2022-04-05 ENCOUNTER — Inpatient Hospital Stay: Payer: Medicare Other | Admitting: Physician Assistant

## 2022-04-05 ENCOUNTER — Other Ambulatory Visit: Payer: Self-pay | Admitting: Nurse Practitioner

## 2022-04-05 ENCOUNTER — Encounter: Payer: Self-pay | Admitting: Nurse Practitioner

## 2022-04-05 ENCOUNTER — Telehealth: Payer: Self-pay | Admitting: Nurse Practitioner

## 2022-04-05 DIAGNOSIS — J189 Pneumonia, unspecified organism: Secondary | ICD-10-CM

## 2022-04-05 MED ORDER — AMOXICILLIN-POT CLAVULANATE 875-125 MG PO TABS: 1.0000 | ORAL_TABLET | Freq: Two times a day (BID) | ORAL | 0 refills | Status: AC

## 2022-04-05 NOTE — Assessment & Plan Note (Signed)
Stable on recent imaging. RLL cystic lesion was reviewed by [name] and noted to be stable in size. She has upcoming CT chest and follow up with Dr. Sondra Come in September; although, she thinks she also has a scan tomorrow but I cannot see this in Epic. Recommended she call Dr. Clabe Seal office to clarify.

## 2022-04-05 NOTE — Progress Notes (Signed)
Called and spoke with patient. She verbalized understanding.   Nothing further needed.  

## 2022-04-05 NOTE — Telephone Encounter (Signed)
Spoke with the pt and notified of response per Joellen Jersey. She verbalized understanding. I have scheduled her for ov with [place] for 04/20/22.

## 2022-04-05 NOTE — Assessment & Plan Note (Signed)
Most likely from radiation therapy from adenocarcinoma of LLL. She is currently being managed by her PCP and was referred to palliative care as well. Pain is much better controlled today.

## 2022-04-05 NOTE — Progress Notes (Signed)
Please notify patient that there are some densities in her left lung, consistent with possible pneumonia. Given the length of her symptoms and productive cough, we will treat her with abx course. I have sent augmentin course for her to take Twice daily for 7 days. Take with food. Take an otc probiotic while taking. Thanks.

## 2022-04-05 NOTE — Telephone Encounter (Signed)
She has noticed some green sputum this morning that is the same since last OV on 04/04/2022. She denies any other symptoms such as shortness of breath, cough or wheezing. She feels some pain in her ribs  on the left side and she is not sure if its due to coughing. She wants to know how long the pneumonia has been in her lungs. Please advise.

## 2022-04-05 NOTE — Telephone Encounter (Signed)
No way to tell how long it has been there. CXR the end of June was clear so developed sometime over the past month. Advise her to take her antibiotics as prescribed and discussed this morning. Schedule her for follow up in 2 weeks to ensure she is improved. Notify or seek emergency care if she develops any worsening symptoms. Thanks.

## 2022-04-13 ENCOUNTER — Ambulatory Visit (INDEPENDENT_AMBULATORY_CARE_PROVIDER_SITE_OTHER): Payer: Medicare Other | Admitting: Family Medicine

## 2022-04-13 ENCOUNTER — Encounter: Payer: Self-pay | Admitting: Family Medicine

## 2022-04-13 VITALS — BP 118/58 | HR 97 | Ht 69.0 in | Wt 186.0 lb

## 2022-04-13 DIAGNOSIS — F411 Generalized anxiety disorder: Secondary | ICD-10-CM

## 2022-04-13 DIAGNOSIS — G893 Neoplasm related pain (acute) (chronic): Secondary | ICD-10-CM | POA: Diagnosis not present

## 2022-04-13 DIAGNOSIS — C3432 Malignant neoplasm of lower lobe, left bronchus or lung: Secondary | ICD-10-CM | POA: Diagnosis not present

## 2022-04-13 DIAGNOSIS — R918 Other nonspecific abnormal finding of lung field: Secondary | ICD-10-CM

## 2022-04-13 DIAGNOSIS — R0781 Pleurodynia: Secondary | ICD-10-CM

## 2022-04-13 MED ORDER — OXYCODONE-ACETAMINOPHEN 10-325 MG PO TABS: 1.0000 | ORAL_TABLET | Freq: Three times a day (TID) | ORAL | 0 refills | Status: AC | PRN

## 2022-04-13 MED ORDER — HYDROXYZINE HCL 50 MG PO TABS: 50.0000 mg | ORAL_TABLET | Freq: Every evening | ORAL | 2 refills | Status: DC | PRN

## 2022-04-13 MED ORDER — ONDANSETRON HCL 4 MG PO TABS: 4.0000 mg | ORAL_TABLET | Freq: Three times a day (TID) | ORAL | 1 refills | Status: DC | PRN

## 2022-04-13 NOTE — Assessment & Plan Note (Signed)
Patient requests refill of hydroxyzine.  Anxiety appears to be stable.  Tolerating medication well

## 2022-04-13 NOTE — Patient Instructions (Signed)
It was wonderful to see you today. Thank you for allowing me to be a part of your care. Below is a short summary of what we discussed at your visit today:  Rib pain I am referring you to the physical medicine and rehabilitation clinic.  This commonly referred to as a pain clinic. Someone from their office should be calling you in 1 to 2 weeks to schedule an appointment.  If you do not hear from them, let us know. We may need to nudge along the referral.    In the meantime, I unfortunately do not have other medications to offer you that work on different pathways.  We are already targeting the primary pain pathways.  I have prescribed you a different pain medication called Percocet.  Take it up to every 8 hours as needed for pain.    I have also sent in a prescription for nausea medicine called Zofran.  This is also up to every 8 hours as needed for nausea.  Please take a Zofran every time you take a Percocet.  This should hopefully help with some of the nausea you feel with the pain medicine.  Make sure to also take these pills with food.  Refills I have sent in a refill of your hydroxyzine as requested.  Please let us know if you need additional refills.  Please bring all of your medications to every appointment!  If you have any questions or concerns, please do not hesitate to contact us via phone or MyChart message.   [name], MD

## 2022-04-13 NOTE — Progress Notes (Signed)
    SUBJECTIVE:   CHIEF COMPLAINT / HPI:   Left-sided rib pain Patient reports continuance of left-sided rib pain.  This has been ongoing for the last year ever since she received radiation for adenocarcinoma of left lower lobe of lung.  The pain radiates from the left lateral ribs medially to the sternum.  She reports it is sharp in nature.  It is now constant throughout the day every day, she rates it 10 out of 10.  She has previously tried topicals such as lidocaine patches and cream, also Voltaren without relief.  OTC such as Tylenol, ibuprofen, and naproxen do not seem to help.  Gabapentin nightly providing no relief.  Has trialed tramadol and oxycodone and while these do help, they make her so nauseous she would rather not use them.  Appears to have been started on duloxetine 30 mg a couple weeks ago, but patient cannot recall if she has started this pill or not.  Denies redness, swelling, or rash over the ribs that are painful.  No recent trauma or concern for injury.  PERTINENT  PMH / PSH: Primary adenocarcinoma of left lower lobe of lung, stage III COPD, bladder cancer, HLD  OBJECTIVE:   BP (!) 118/58   Pulse 97   Ht 5\' 9"  (1.753 m)   Wt 186 lb (84.4 kg)   SpO2 94%   BMI 27.47 kg/m    PHQ-9:     04/13/2022   11:30 AM 03/11/2022    1:31 PM 02/25/2022    2:23 PM  Depression screen PHQ 2/9  Decreased Interest  0 0  Down, Depressed, Hopeless  1 0  PHQ - 2 Score  1 0  Altered sleeping 2 1 2   Tired, decreased energy 2 0 2  Change in appetite  0 0  Feeling bad or failure about yourself   0 0  Trouble concentrating  0 0  Moving slowly or fidgety/restless  0 0  Suicidal thoughts  0 0  PHQ-9 Score  2 4    Physical Exam General: Awake, alert, oriented Cardiovascular: Regular rate and rhythm, S1 and S2 present, no murmurs auscultated Respiratory: Lung fields clear to auscultation bilaterally MSK: Some TTP over sternum  ASSESSMENT/PLAN:   Cancer related pain Chronic,  uncontrolled.  Unable to tolerate oxycodone 5 mg or 10 mg due to nausea.  See above note for other modalities tried without relief.  Patient did see palliative care outpatient, but voices today she would rather not continue with them.  Patient is amenable to physical medicine rehabilitation referral (PMR) for broader scope pain control.  I will prescribe Percocet 10-325 with Zofran 4 mg to help abate nausea.  Hopefully this regimen will provide some relief while awaiting PMR evaluation.  Anxiety state Patient requests refill of hydroxyzine.  Anxiety appears to be stable.  Tolerating medication well     [name], MD [place]

## 2022-04-13 NOTE — Assessment & Plan Note (Signed)
Chronic, uncontrolled.  Unable to tolerate oxycodone 5 mg or 10 mg due to nausea.  See above note for other modalities tried without relief.  Patient did see palliative care outpatient, but voices today she would rather not continue with them.  Patient is amenable to physical medicine rehabilitation referral (PMR) for broader scope pain control.  I will prescribe Percocet 10-325 with Zofran 4 mg to help abate nausea.  Hopefully this regimen will provide some relief while awaiting PMR evaluation.

## 2022-04-14 LAB — COMPREHENSIVE METABOLIC PANEL
ALT: 17 IU/L (ref 0–32)
AST: 12 IU/L (ref 0–40)
Albumin/Globulin Ratio: 1.4 (ref 1.2–2.2)
Albumin: 4.4 g/dL (ref 3.9–4.9)
Alkaline Phosphatase: 114 IU/L (ref 44–121)
BUN/Creatinine Ratio: 16 (ref 12–28)
BUN: 19 mg/dL (ref 8–27)
Bilirubin Total: 1.4 mg/dL — ABNORMAL HIGH (ref 0.0–1.2)
CO2: 23 mmol/L (ref 20–29)
Calcium: 9.5 mg/dL (ref 8.7–10.3)
Chloride: 102 mmol/L (ref 96–106)
Creatinine, Ser: 1.17 mg/dL — ABNORMAL HIGH (ref 0.57–1.00)
Globulin, Total: 3.1 g/dL (ref 1.5–4.5)
Glucose: 92 mg/dL (ref 70–99)
Potassium: 4.3 mmol/L (ref 3.5–5.2)
Sodium: 140 mmol/L (ref 134–144)
Total Protein: 7.5 g/dL (ref 6.0–8.5)
eGFR: 51 mL/min/{1.73_m2} — ABNORMAL LOW (ref >59)

## 2022-04-15 ENCOUNTER — Telehealth: Payer: Self-pay | Admitting: Family Medicine

## 2022-04-15 NOTE — Telephone Encounter (Signed)
Patient is calling back after making an appointment with [name]. She would like for her to call her when she gets a chance. She needs to speak with her before her appointment date.   I offered patient sooner appointment with another provider but she would only schedule with [name].  The best call back number is [phone]

## 2022-04-18 ENCOUNTER — Encounter: Payer: Self-pay | Admitting: Physical Medicine and Rehabilitation

## 2022-04-19 NOTE — Telephone Encounter (Signed)
Returned patient's call.  Confirmed correct patient with date of birth.  Patient reports continued uncontrolled rib pain.  At her last visit, she had reported that the previously prescribed oxycodone IR 10 mg "was not touching the pain"and that is making her quite nauseous. At that last visit, I prescribed Percocet 10-325 with Zofran 4 mg to help abate the nausea.  Now patient reports that the Percocet with Zofran combo is helpful for the nausea, but that the Percocet is again not providing any relief and instead making her quite itchy.  She is sleeping a lot more because she has to take Benadryl with the Percocet for the itching.  She also reports that it hurts quite a lot when she coughs.  Also now because of the rib pain, she has to "help the left arm".  She is clarifies that she does not have any true neurological weakness, no difficulty gripping pencil or mug, but that the rib pain severely limits the use of her left arm.  She has heard from physical medicine rehabilitation, however the soonest they are able to book her was October.  She is currently on a waiting list, but would prefer better pain relief sooner than October.  Current pain regimen: Baclofen 5 mg 3 times daily Diclofenac 1% gel 2 g 4 times daily Duloxetine 30 mg daily Gabapentin 600 mg nightly Ibuprofen 800 mg every 8 as needed Percocet 10-325 mg every 8 as needed  Assessment: Uncontrolled left rib pain very likely secondary to radiation treatment for lung cancer.  Patient unable to tolerate oxycodone or Percocet due to side effects, also reports they do not help much.  Quite hopeful for PMR to provide significant pain relief, however her appointment is a couple months away  Plan: - Stop Percocet given lack of benefit and significant side effects of itching and nausea - Increase gabapentin to 600 mg twice daily; if tolerated after 3 to 4 days, can increase to 600 mg in a.m. and 900 mg nightly - I will discuss with clinic  preceptors the possibility of using stronger narcotics given the radiation-type pain - She has an appointment with me on 8/14  [name], MD

## 2022-04-20 ENCOUNTER — Ambulatory Visit (INDEPENDENT_AMBULATORY_CARE_PROVIDER_SITE_OTHER): Payer: Medicare Other | Admitting: Nurse Practitioner

## 2022-04-20 ENCOUNTER — Encounter: Payer: Self-pay | Admitting: Nurse Practitioner

## 2022-04-20 DIAGNOSIS — J449 Chronic obstructive pulmonary disease, unspecified: Secondary | ICD-10-CM

## 2022-04-20 DIAGNOSIS — C3432 Malignant neoplasm of lower lobe, left bronchus or lung: Secondary | ICD-10-CM

## 2022-04-20 DIAGNOSIS — R0781 Pleurodynia: Secondary | ICD-10-CM

## 2022-04-20 DIAGNOSIS — J189 Pneumonia, unspecified organism: Secondary | ICD-10-CM

## 2022-04-20 HISTORY — DX: Pneumonia, unspecified organism: J18.9

## 2022-04-20 NOTE — Progress Notes (Unsigned)
@Patient  ID: [name], female    DOB: 1956/08/12, 66 y.o.   MRN: [id number]  Chief Complaint  Patient presents with   Follow-up    She is doing some better since last OV.     Referring provider: [name], MD  HPI: 66 year old female, former smoker (50 pack years) followed for COPD Gold 3 with centrilobular emphysema and adenocarcinoma of the left lung.  She is a patient of Dr. Juline Patch and last seen in office 03/05/2022.  Past medical history significant for bladder cancer status postresection February 2022, anxiety, RLS, HLD, chronic back pain.  She was initially seen by her PCP and encouraged to have a lung cancer screening CT due to her smoking history, which was completed in October 2021.  The CT scan revealed bilateral lower lobe cystic peripheral based lesions, both greater than 4 cm in size concerning for multifocal adenocarcinoma.  Patient had no respiratory symptoms at this time.  She underwent bronchoscopy in December 2021; pathology with adenocarcinoma of left lower lobe.  Inconclusive biopsy of the right lower lobe; however given irregular cystic shaped lesion concerned this was adenocarcinoma as well. PET scan was relatively equivocal with low to moderate SUV uptake.  She was referred to cardiothoracic surgery who deemed her not a good surgical candidate due to poor lung function.  She made the decision to undergo radiation to the left lower lobe adenocarcinoma and monitor the right side with consideration in the future for empiric treatment as well.  TEST/EVENTS:  02/22/2022 CT chest without contrast: There is atherosclerosis.  No identified LAD.  There is centrilobular emphysema and scarring in the right lung apex.  There is stable scarring in the right middle lobe posteriorly.  There is an unchanged mixed density nodule in the right lower lobe with large air cyst elements and thickened interstitial elements especially along its margins, total size is 7.8 x 5.4 cm, not  significantly changed from prior exam but enlarged when compared to 01/06/2021.  Stable mild subpleural nodularity in the right lower lobe.  There is a subpleural nodule medially in the left upper lobe which measures 0.7 x 0.7 cm and has been slowly enlarging over the last 2 years.  Previously 0.3 x 0.2.  There is stable scarring in the left lower lobe with peripheral triangular opacity, likely therapy related. 03/02/2022 CXR 2 view: No acute process.  There is mild atelectasis or scarring at the lung bases.  There is a questionable cortical irregularity right involving the T8 rib on the left  03/03/2022: OV with Labrea Eccleston NP for persistent left-sided pain.  She has had ongoing issues with this for the last 7 months.  She has discussed this with numerous providers and been tried on different opioids, gabapentin and topical treatments with minimal relief.  She did discuss this with Dr. Sondra Come who ordered a chest x-ray that showed a deformity to the left T8 rib.  She went and had dedicated rib series today and has yet to receive results.  We also discussed her progressive DOE over the last few months.  Cough unchanged from baseline. She is not on any maintenance regimen for COPD.  She denies any wheezing, hemoptysis, weight loss, anorexia.  She does use her albuterol often. Started her on Breztri - provided with samples. CT with slight increase in LUL nodule; RLL lesion is stable from April but increased in size when compared to year prior. Advised I would discuss with [name] and determine appropriate next steps.  03/05/2022: OV with [name]. Very anxious about the RLL cystic lesion - advised that upon his review, lesion looks very similar in size. She wanted to discuss with Dr. Sondra Come as well - message sent to him. Continue triple therapy for COPD.   04/04/2022: OV with Ellayna Hilligoss NP for follow up after being treated for possible AECOPD by her PCP on 7/10. She reported a productive cough x 1 month with green sputum.  Treated with decadron and z pack. Today, she reports that her breathing is better. She still has an occasional productive cough; does not occur daily. Sometimes with green mucus but usually dry. She has allergies, with nasal congestion, sneezing and itchy eyes, which she takes flonase and an antihistamine eye drop for. No OTC allergy meds. She denies any wheezing, fevers, night sweats, hemoptysis, leg swelling, orthopnea. Her rib pain is better than previously. Imaging did not reveal any fracture; suspected to be related to radiation. She has a follow up CT scan in September; although she believes she has one scheduled tomorrow as well. She continues on [place]. Rarely uses albuterol. CXR showed increased densities in the left parahilar region and linear densities in the lower lung fields, concerning for pna given her symptoms. Treated with augmentin x 7 days. Close follow up  04/20/2022: Today - follow up Patient presents today for follow up after being treated for pneumonia. She has completed her augmentin course without difficulties. Her breathing has improved and her cough has started to clear up. She still will occasionally produce a small amount of yellow to green sputum, but it is minimal and lighter than it was previously. Chest congestion has also improved. Denies any fevers, worsening fatigue, night sweats, chills, anorexia. She is using Breztri twice daily, which has helped her breathing a lot. She has not had to use albuterol much recently. Her primary problem right now is her pain in her side. She was having some better control of this last time I saw her but feels as though her pain medication is not working as well as it was. She has been referred to palliative care for pain management and is currently being managed by her PCP.   Allergies  Allergen Reactions   Codeine Nausea And Vomiting and Other (See Comments)    Hallucinations   Prednisone Other (See Comments)    'Made me feel funny inside  my head"-pt cannot be specific    Immunization History  Administered Date(s) Administered   Fluad Quad(high Dose 65+) 06/18/2021   Influenza,inj,Quad PF,6+ Mos 07/19/2013, 07/17/2014, 06/08/2015, 10/07/2016, 09/13/2017, 06/27/2018, 06/14/2019, 06/17/2020   PFIZER Comirnaty(Gray Top)Covid-19 Tri-Sucrose Vaccine 04/26/2021   PFIZER(Purple Top)SARS-COV-2 Vaccination 12/05/2019, 12/30/2019, 06/22/2020   PPD Test 10/12/2012   Pneumococcal Conjugate-13 06/23/2020   Pneumococcal Polysaccharide-23 07/17/2014   Tdap 03/30/2012   Zoster Recombinat (Shingrix) 12/09/2017, 04/03/2018    Past Medical History:  Diagnosis Date   Abnormal CT lung screening 07/10/2020   Anxiety    Arthritis    Bladder cancer ([place]) 09/2020   urologist--- dr Tresa Moore, incidental finding on PET scan 12/ 2021,  s/p TURBT 09-18-2020 high grade papillary urothelial    Centrilobular emphysema ([place])    Close exposure to COVID-19 virus 05/03/2019   Constipation 04/09/2019   DDD (degenerative disc disease), lumbar    Depression    DOE (dyspnea on exertion)    10-29-2020  per pt can do house work without sob, when walks/ stairs stops frequently  (stated recovers quickly when stops)   Emphysema lung ([place])  Epidermoid cyst 10/29/2018   Recurrent. Located on left hip at iliac crest 2" anterior to midaxillary line. Removed 2013, 2020, 2022.    Epigastric pain 11/16/2020   Fibroid 2003   GERD (gastroesophageal reflux disease)    History of radiation therapy    Left lung- 10/06/20-10/16/20- Dr. Gery Pray   Leg cramping 11/13/2019   Menopausal vaginal dryness 11/13/2019   Primary adenocarcinoma of lower lobe of left lung ([place]) 07/2020   oncologist--- dr Julien Nordmann---  dx 11/ 2021 bilateral lower lobe masses,  s/p bronchoscopy w/ bx's 07-28-2020 malignancy cells on left ;  completed SBRT bilateral lower lung 10-16-2020 5 fractions   Radiation-induced dermatitis    on 10-29-2020 pt complaint stomach and chest areas,  completed  SBRT 10-16-2020   Seasonal allergies    Stage 3 severe COPD by GOLD classification [facility])    pulmonologist--- dr Valeta Harms,  using anoro inhaler daily   Wears glasses     Tobacco History: Social History   Tobacco Use  Smoking Status Former   Packs/day: 1.00   Years: 50.00   Total pack years: 50.00   Types: Cigarettes   Start date: 04/13/1975   Quit date: 07/14/2020   Years since quitting: 1.7  Smokeless Tobacco Never   Counseling given: Not Answered   Outpatient Medications Prior to Visit  Medication Sig Dispense Refill   albuterol (VENTOLIN HFA) 108 (90 Base) MCG/ACT inhaler USE 2 INHALATIONS BY MOUTH  INTO THE LUNGS EVERY 6  HOURS AS NEEDED FOR  SHORTNESS OF BREATH OR  WHEEZING 26.8 g 2   atorvastatin (LIPITOR) 40 MG tablet Take 1 tablet (40 mg total) by mouth at bedtime. 90 tablet 3   azelastine (OPTIVAR) 0.05 % ophthalmic solution Place 1 drop into both eyes 2 (two) times daily as needed for allergies.     baclofen (LIORESAL) 10 MG tablet Take 0.5 tablets (5 mg total) by mouth 3 (three) times daily. (Patient taking differently: Take 5 mg by mouth 3 (three) times daily as needed for muscle spasms.) 30 each 0   Budeson-Glycopyrrol-Formoterol (BREZTRI AEROSPHERE) 160-9-4.8 MCG/ACT AERO Inhale 2 puffs into the lungs in the morning and at bedtime. 10.7 g 5   carbamide peroxide (DEBROX) 6.5 % OTIC solution Place 5 drops into both ears 2 (two) times daily. 15 mL 0   chlorhexidine (PERIDEX) 0.12 % solution Use as directed 15 mLs in the mouth or throat in the morning.     clobetasol cream (TEMOVATE) 2.99 % Apply 1 application topically 2 (two) times daily as needed. 30 g 2   diclofenac Sodium (VOLTAREN) 1 % GEL Apply 2 g topically 4 (four) times daily. Apply to painful area of ribs. Need to use regularly for relief. 100 g 2   DULoxetine (CYMBALTA) 30 MG capsule Take 1 capsule (30 mg total) by mouth daily. 30 capsule 3   fluticasone (FLONASE) 50 MCG/ACT nasal spray Place 2 sprays into both  nostrils daily. (Patient taking differently: Place 2 sprays into both nostrils daily as needed (allergies).) 16 g 6   gabapentin (NEURONTIN) 300 MG capsule Take 1 capsule (300 mg total) by mouth at bedtime. After 4 days increase to 2 capsules (600 mg total) by mouth at bedtime. (Patient taking differently: Take 600 mg by mouth at bedtime as needed (cramps).) 60 capsule 1   hydrOXYzine (ATARAX) 50 MG tablet Take 1 tablet (50 mg total) by mouth at bedtime as needed for anxiety or itching (sleep). 90 tablet 2   ibuprofen (ADVIL) 800 MG  tablet Take 1 tablet (800 mg total) by mouth every 8 (eight) hours as needed. 30 tablet 0   MELATONIN GUMMIES PO Take 10 mg by mouth at bedtime.     ondansetron (ZOFRAN) 4 MG tablet Take 1 tablet (4 mg total) by mouth every 8 (eight) hours as needed for nausea or vomiting. 30 tablet 1   pantoprazole (PROTONIX) 40 MG tablet Take 1 tablet (40 mg total) by mouth daily as needed (acid indigestion/heartburn.). 30 tablet 2   No facility-administered medications prior to visit.     Review of Systems:   Constitutional: No weight loss or gain, night sweats, fevers, chills, fatigue, or lassitude. HEENT: No headaches, difficulty swallowing, tooth/dental problems, or sore throat, ear ache. + nasal congestion, sneezing, occasional itchy eyes (improved) CV:  No chest pain, orthopnea, PND, swelling in lower extremities, anasarca, dizziness, palpitations, syncope Resp: +shortness of breath with exertion (improved), cough (improved). No excess mucus or change in color of mucus.. No hemoptysis. No wheezing.  No chest wall deformity Skin: No rash, lesions, ulcerations MSK:  No joint pain or swelling.  No decreased range of motion.  No back pain. +left rib pain Neuro: No dizziness or lightheadedness.  Psych: No depression or anxiety. Mood stable.     Physical Exam:  BP 116/72 (BP Location: Left Arm, Cuff Size: Normal)   Pulse 71   Temp 97.9 F (36.6 C) (Oral)   Ht 5\' 9"  (1.753  m)   Wt 188 lb 12.8 oz (85.6 kg)   SpO2 95%   BMI 27.88 kg/m   GEN: Pleasant, interactive, well-appearing; in no acute distress. HEENT:  Normocephalic and atraumatic. PERRLA. Sclera white. Nasal turbinates pink, moist and patent bilaterally. No rhinorrhea present. Oropharynx pink and moist, without exudate or edema. No lesions, ulcerations, or postnasal drip.  NECK:  Supple w/ fair ROM. No JVD present. Normal carotid impulses w/o bruits. Thyroid symmetrical with no goiter or nodules palpated. No lymphadenopathy.   CV: RRR, no m/r/g, no peripheral edema. Pulses intact, +2 bilaterally. No cyanosis, pallor or clubbing. PULMONARY:  Unlabored, regular breathing. Clear bilaterally A&P w/o wheezes/rales/rhonchi. No accessory muscle use. No dullness to percussion. GI: BS present and normoactive. Soft, non-tender to palpation. No organomegaly or masses detected. No CVA tenderness. MSK: No erythema, warmth. Cap refil <2 sec all extrem. No deformities or joint swelling noted.  Neuro: A/Ox3. No focal deficits noted.   Skin: Warm, no lesions or rashe Psych: Normal affect and behavior. Judgement and thought content appropriate.     Lab Results:  CBC    Component Value Date/Time   WBC 6.6 08/17/2021 0715   RBC 4.99 08/17/2021 0715   HGB 13.9 08/17/2021 0715   HGB 13.1 08/18/2020 1348   HGB 14.1 06/23/2020 1237   HCT 43.3 08/17/2021 0715   HCT 43.6 06/23/2020 1237   PLT 366 08/17/2021 0715   PLT 360 08/18/2020 1348   PLT 366 06/23/2020 1237   MCV 86.8 08/17/2021 0715   MCV 86 06/23/2020 1237   MCH 27.9 08/17/2021 0715   MCHC 32.1 08/17/2021 0715   RDW 13.6 08/17/2021 0715   RDW 13.2 06/23/2020 1237   LYMPHSABS 1.3 12/17/2020 1029   LYMPHSABS 2.2 06/23/2020 1237   MONOABS 0.7 12/17/2020 1029   EOSABS 0.2 12/17/2020 1029   EOSABS 0.2 06/23/2020 1237   BASOSABS 0.1 12/17/2020 1029   BASOSABS 0.1 06/23/2020 1237    BMET    Component Value Date/Time   NA 140 04/13/2022 1237   K 4.3  04/13/2022 1237   CL 102 04/13/2022 1237   CO2 23 04/13/2022 1237   GLUCOSE 92 04/13/2022 1237   GLUCOSE 90 12/17/2020 1029   BUN 19 04/13/2022 1237   CREATININE 1.17 (H) 04/13/2022 1237   CREATININE 1.05 (H) 08/18/2020 1348   CREATININE 0.95 02/10/2015 1526   CALCIUM 9.5 04/13/2022 1237   GFRNONAA 58 (L) 12/17/2020 1029   GFRNONAA 59 (L) 08/18/2020 1348   GFRAA 69 06/23/2020 1237    BNP    Component Value Date/Time   BNP 66.1 12/17/2020 1029     Imaging:  DG Chest 2 View  Result Date: 04/04/2022 CLINICAL DATA:  Productive cough EXAM: CHEST - 2 VIEW COMPARISON:  03/02/2022 FINDINGS: Cardiac size is within normal limits. There are no signs of alveolar pulmonary edema. Linear densities are seen in left parahilar region and medial left lower lung field. Crowding of markings in right lower lung field has not changed. There is no pleural effusion or pneumothorax. IMPRESSION: Increased markings are seen in left parahilar region which may suggest crowding of normal bronchovascular structures or subsegmental atelectasis/pneumonia. Linear densities in the lower lung fields most likely suggest scarring or subsegmental atelectasis. Electronically Signed   By: Elmer Picker M.D.   On: 04/04/2022 17:20    dexamethasone (DECADRON) injection 10 mg     Date Action Dose Route User   03/21/2022 1553 Given 10 mg Intramuscular (Left Deltoid) Ozella Almond, CMA          Latest Ref Rng & Units 08/03/2020    9:11 AM  PFT Results  FVC-Pre L 2.03   FVC-Predicted Pre % 67   FVC-Post L 2.15   FVC-Predicted Post % 71   Pre FEV1/FVC % % 67   Post FEV1/FCV % % 57   FEV1-Pre L 1.37   FEV1-Predicted Pre % 58   FEV1-Post L 1.22   DLCO uncorrected ml/min/mmHg 9.46   DLCO UNC% % 41   DLCO corrected ml/min/mmHg 9.26   DLCO COR %Predicted % 40   DLVA Predicted % 57   TLC L 5.13   TLC % Predicted % 90   RV % Predicted % 133     No results found for: "NITRICOXIDE"      Assessment  & Plan:   CAP (community acquired pneumonia) Seen two weeks ago with productive cough with purulent sputum. CXR with left lung densities. Treated for pneumonia with augmentin course x 7 days. She has completed this and is clinically improved. Advised her to continue mucociliary clearance therapies. Return sooner if she develops worsening productive cough, increased SOB, fatigue, or fevers. Plan for repeat imaging in 4 weeks to ensure resolution..  Patient Instructions  Continue Breztri 2 puffs Twice daily. Brush tongue and rinse mouth afterwards.  Continue Albuterol inhaler 2 puffs every 6 hours as needed for shortness of breath or wheezing. Notify if symptoms persist despite rescue inhaler/neb use. Continue flonase nasal spray 2 sprays each nostril daily Try over the counter antihistamine such as Zyrtec, Claritin, Allegra or Xyzal for allergies   Mucinex (guaifenesin) 600 mg Twice daily for chest congestion/cough  Flutter valve 2-3 times a day    Follow up in 4 weeks with repeat chest x ray with [name]. If symptoms do not improve or worsen, please contact office for sooner follow up or seek emergency care.    Stage 3 severe COPD by GOLD classification ([place]) Compensated on current regimen. Continue triple therapy with Breztri and prn albuterol.  Primary adenocarcinoma of  lower lobe of left lung (H[facility]) Stable on recent imaging. RLL cystic lesion was reviewed by [name] and noted to be stable in size. She has upcoming CT chest and follow up with Dr. Sondra Come in September  Rib pain on left side Most likely from radiation therapy from adenocarcinoma of LLL. She is currently being managed by her PCP and was referred to palliative care as well.     I spent 28 minutes of dedicated to the care of this patient on the date of this encounter to include pre-visit review of records, face-to-face time with the patient discussing conditions above, post visit ordering of testing, clinical  documentation with the electronic health record, making appropriate referrals as documented, and communicating necessary findings to members of the patients care team.  Clayton Bibles, NP 04/20/2022  Pt aware and understands NP's role.

## 2022-04-20 NOTE — Patient Instructions (Addendum)
Continue Breztri 2 puffs Twice daily. Brush tongue and rinse mouth afterwards.  Continue Albuterol inhaler 2 puffs every 6 hours as needed for shortness of breath or wheezing. Notify if symptoms persist despite rescue inhaler/neb use. Continue flonase nasal spray 2 sprays each nostril daily Try over the counter antihistamine such as Zyrtec, Claritin, Allegra or Xyzal for allergies   Mucinex (guaifenesin) 600 mg Twice daily for chest congestion/cough  Flutter valve 2-3 times a day    Follow up in 4 weeks with repeat chest x ray with [name]. If symptoms do not improve or worsen, please contact office for sooner follow up or seek emergency care.

## 2022-04-20 NOTE — Assessment & Plan Note (Signed)
Most likely from radiation therapy from adenocarcinoma of LLL. She is currently being managed by her PCP and was referred to palliative care as well.

## 2022-04-20 NOTE — Assessment & Plan Note (Signed)
Stable on recent imaging. RLL cystic lesion was reviewed by [name] and noted to be stable in size. She has upcoming CT chest and follow up with Dr. Sondra Come in September

## 2022-04-20 NOTE — Assessment & Plan Note (Signed)
Seen two weeks ago with productive cough with purulent sputum. CXR with left lung densities. Treated for pneumonia with augmentin course x 7 days. She has completed this and is clinically improved. Advised her to continue mucociliary clearance therapies. Return sooner if she develops worsening productive cough, increased SOB, fatigue, or fevers. Plan for repeat imaging in 4 weeks to ensure resolution..  Patient Instructions  Continue Breztri 2 puffs Twice daily. Brush tongue and rinse mouth afterwards.  Continue Albuterol inhaler 2 puffs every 6 hours as needed for shortness of breath or wheezing. Notify if symptoms persist despite rescue inhaler/neb use. Continue flonase nasal spray 2 sprays each nostril daily Try over the counter antihistamine such as Zyrtec, Claritin, Allegra or Xyzal for allergies   Mucinex (guaifenesin) 600 mg Twice daily for chest congestion/cough  Flutter valve 2-3 times a day    Follow up in 4 weeks with repeat chest x ray with [name]. If symptoms do not improve or worsen, please contact office for sooner follow up or seek emergency care.

## 2022-04-20 NOTE — Assessment & Plan Note (Signed)
Compensated on current regimen. Continue triple therapy with Breztri and prn albuterol.

## 2022-04-21 ENCOUNTER — Encounter: Payer: Self-pay | Admitting: Nurse Practitioner

## 2022-04-21 ENCOUNTER — Other Ambulatory Visit: Payer: Medicare Other | Admitting: Hospice

## 2022-04-25 ENCOUNTER — Ambulatory Visit (INDEPENDENT_AMBULATORY_CARE_PROVIDER_SITE_OTHER): Payer: Medicare Other | Admitting: Family Medicine

## 2022-04-25 ENCOUNTER — Encounter: Payer: Self-pay | Admitting: Family Medicine

## 2022-04-25 ENCOUNTER — Ambulatory Visit
Admission: RE | Admit: 2022-04-25 | Discharge: 2022-04-25 | Disposition: A | Discharge status: Home / Self Care | Payer: Medicare Other | Source: Ambulatory Visit | Attending: Family Medicine | Admitting: Family Medicine

## 2022-04-25 VITALS — BP 134/76 | HR 92 | Wt 187.0 lb

## 2022-04-25 DIAGNOSIS — R093 Abnormal sputum: Secondary | ICD-10-CM

## 2022-04-25 DIAGNOSIS — R0781 Pleurodynia: Secondary | ICD-10-CM

## 2022-04-25 DIAGNOSIS — R0602 Shortness of breath: Secondary | ICD-10-CM

## 2022-04-25 DIAGNOSIS — R059 Cough, unspecified: Secondary | ICD-10-CM | POA: Diagnosis not present

## 2022-04-25 MED ORDER — NALOXONE HCL 4 MG/0.1ML NA LIQD: 1.0000 | Freq: Once | NASAL | 10 refills | Status: AC

## 2022-04-25 MED ORDER — HYDROMORPHONE HCL 2 MG PO TABS: ORAL_TABLET | ORAL | 0 refills | Status: AC

## 2022-04-25 MED ORDER — GABAPENTIN 300 MG PO CAPS: ORAL_CAPSULE | ORAL | 1 refills | Status: DC

## 2022-04-25 NOTE — Progress Notes (Signed)
SUBJECTIVE:   CHIEF COMPLAINT / HPI:   Ongoing left rib pain [name] seen by myself at [facility] 04/13/2022 for left rib pain.  At that time, was thought to be secondary to radiation treatment for left lower lobe adenocarcinoma.  At that time, we had stopped Percocet giving side effects of itching and nausea.  I increased her gabapentin to 600 mg a.m. + 900 mg p.m.  Of note, she was seen at [facility] 7/10 and diagnosed with COPD exacerbation.  She was treated with azithromycin x5 days and IM Decadron (given prednisone intolerance).  She was then seen by pulmonology on 7/24 and diagnosed with community-acquired pneumonia after reviewing CXR, Rx Augmentin 875-125 mg twice daily x7 days.   [name] tearfully reports to me today that the gabapentin has not touched her pain and that is becoming unbearable.  She is severely limited in her daily activities and is in constant pain.  She also reports me that she is continuing to have increased productive cough with green sputum.  Upon discussing the previous treatments with azithromycin, Decadron, and Augmentin, she reports that these have not made much of a difference in her symptoms.  PERTINENT  PMH / PSH: Primary lung adenocarcinoma of left lower lobe s/p radiation, bladder cancer s/p resection Feb 2022, gold stage III COPD, HLD, costochondritis, osteopenia  OBJECTIVE:   BP 134/76   Pulse 92   Wt 187 lb (84.8 kg)   SpO2 95%   BMI 27.62 kg/m    PHQ-9:     04/13/2022   11:30 AM 03/11/2022    1:31 PM 02/25/2022    2:23 PM  Depression screen PHQ 2/9  Decreased Interest  0 0  Down, Depressed, Hopeless  1 0  PHQ - 2 Score  1 0  Altered sleeping 2 1 2   Tired, decreased energy 2 0 2  Change in appetite  0 0  Feeling bad or failure about yourself   0 0  Trouble concentrating  0 0  Moving slowly or fidgety/restless  0 0  Suicidal thoughts  0 0  PHQ-9 Score  2 4    Physical Exam General: Awake, alert, oriented, intermittently  tearful Cardiovascular: Regular rate and rhythm, S1 and S2 present, no murmurs auscultated Respiratory: Most lung fields clear to auscultation bilaterally, right posterior lower lobe with diminished breath sounds MSK: Postradiation scarring to posterior left rib cage, inferior to scapular border  ASSESSMENT/PLAN:   Rib pain No improvement.  We will alter pain regimen.  She was previously referred to palliative, however she politely declines to continue with them.  She has been referred to physical medicine rehabilitation (PMR) and has an appointment with them in October (first available).  She is currently on a wait list to get in with them.  PCP [name] is attempting to control pain until she is able to get in with PMR for more definitive treatment.  This rib pain is likely secondary to radiation treatment for her lung cancer, however would hate to miss active infection or other etiology.  Given ongoing sputum production and increased cough without relief, could consider ongoing COPD exacerbation versus pneumonia. - CXR 2 view - If CXR positive for pneumonia, will treat appropriately - If CXR negative for pneumonia, will treat as COPD exacerbation with Augmentin + steroids - Increase gabapentin to 600 mg a.m., 600 mg afternoon, 900 mg nightly - Stop Percocet given nausea, itching, and lack of relief - Start Dilaudid p.o. With oxy IR,  previously prescribed 6.6 MME per dose (every 4-6 hours).  Dilaudid equivalent is 1.65 mg per dose.  We will start patient on 2 mg Dilaudid every 4 to 6 hours with instructions to increase to 3 mg if needing better pain control. - Rx Narcan nasal spray x2 for safety - Start flutter valve and incentive spirometer 3 times daily - Follow-up in 1 week (may be virtual or in person)    [name], MD [place]

## 2022-04-25 NOTE — Assessment & Plan Note (Signed)
No improvement.  We will alter pain regimen.  She was previously referred to palliative, however she politely declines to continue with them.  She has been referred to physical medicine rehabilitation (PMR) and has an appointment with them in October (first available).  She is currently on a wait list to get in with them.  PCP [name] is attempting to control pain until she is able to get in with PMR for more definitive treatment.  This rib pain is likely secondary to radiation treatment for her lung cancer, however would hate to miss active infection or other etiology.  Given ongoing sputum production and increased cough without relief, could consider ongoing COPD exacerbation versus pneumonia. - CXR 2 view - If CXR positive for pneumonia, will treat appropriately - If CXR negative for pneumonia, will treat as COPD exacerbation with Augmentin + steroids - Increase gabapentin to 600 mg a.m., 600 mg afternoon, 900 mg nightly - Stop Percocet given nausea, itching, and lack of relief - Start Dilaudid p.o. With oxy IR, previously prescribed 6.6 MME per dose (every 4-6 hours).  Dilaudid equivalent is 1.65 mg per dose.  We will start patient on 2 mg Dilaudid every 4 to 6 hours with instructions to increase to 3 mg if needing better pain control. - Rx Narcan nasal spray x2 for safety - Start flutter valve and incentive spirometer 3 times daily - Follow-up in 1 week (may be virtual or in person)

## 2022-04-25 NOTE — Patient Instructions (Addendum)
It was wonderful to see you today. Thank you for allowing me to be a part of your care. Below is a short summary of what we discussed at your visit today:  Rib pain Go get a chest x-ray at the [facility] imaging in the [facility].  You may walk-in anytime to get this.  After we reviewed the results, I will send in medications, likely an antibiotic and steroids.  Start Dilaudid.  Start with 2 mg every 4-6 hours.  Watch for this makes you feel, as this can make people very sleepy.  If after 2 to 3 days of 2 mg every 4-6 hours, you are not getting enough pain relief, you can increase to 3 mg every 4-6 hours (will be 1-1/2 tablets).   The Dilaudid might make you constipated.  Please start MiraLAX every other day and increase as needed for constipation.  Increase the gabapentin.  While I would start the Dilaudid first and see how it affects you, after you feel good about that I would increase your gabapentin.  Start using 600 mg in the morning and afternoon then 900 mg at bedtime.  I want you to use a flutter valve 3 times daily.  You may get these from a medical supply store or online.  This will help to break up any mucus so you can cough it up easier.  I also want to use an incentive spirometer 3 times daily.  This helps to keep your lungs open to reduce the risk of infection.  Please book a follow-up appointment with myself within about a week.  It can be either in person or virtual, both are fine with me.  Book what ever works best for you.    If you have any questions or concerns, please do not hesitate to contact us via phone or MyChart message.   [name], MD

## 2022-04-26 ENCOUNTER — Telehealth: Payer: Self-pay | Admitting: Family Medicine

## 2022-04-26 DIAGNOSIS — R918 Other nonspecific abnormal finding of lung field: Secondary | ICD-10-CM

## 2022-04-26 DIAGNOSIS — F411 Generalized anxiety disorder: Secondary | ICD-10-CM

## 2022-04-26 DIAGNOSIS — R0781 Pleurodynia: Secondary | ICD-10-CM

## 2022-04-26 DIAGNOSIS — G893 Neoplasm related pain (acute) (chronic): Secondary | ICD-10-CM

## 2022-04-26 DIAGNOSIS — J449 Chronic obstructive pulmonary disease, unspecified: Secondary | ICD-10-CM

## 2022-04-26 DIAGNOSIS — C3432 Malignant neoplasm of lower lobe, left bronchus or lung: Secondary | ICD-10-CM

## 2022-04-26 DIAGNOSIS — J441 Chronic obstructive pulmonary disease with (acute) exacerbation: Secondary | ICD-10-CM

## 2022-04-26 MED ORDER — AMOXICILLIN-POT CLAVULANATE 875-125 MG PO TABS: 1.0000 | ORAL_TABLET | Freq: Two times a day (BID) | ORAL | 0 refills | Status: AC

## 2022-04-26 MED ORDER — HYDROXYZINE HCL 50 MG PO TABS: 50.0000 mg | ORAL_TABLET | Freq: Every evening | ORAL | 2 refills | Status: DC | PRN

## 2022-04-26 MED ORDER — DEXAMETHASONE SODIUM PHOSPHATE 10 MG/ML IJ SOLN
10.0000 mg | Freq: Once | INTRAMUSCULAR | Status: AC
  Administered 2022-04-28: 10 mg via INTRAMUSCULAR

## 2022-04-26 MED ORDER — DEXAMETHASONE SODIUM PHOSPHATE 10 MG/ML IJ SOLN
10.0000 mg | Freq: Once | INTRAMUSCULAR | Status: AC
  Administered 2022-05-03: 10 mg via INTRAMUSCULAR

## 2022-04-26 NOTE — Telephone Encounter (Signed)
Called to discuss results from chest XR.  CXR without evidence of pneumonia, will treat cough and phlegm as COPD exacerbation.  Sent prescription for Augmentin 875-125 mg twice daily x5 days.  Given patient's previous intolerance to p.o. prednisone and great tolerance of IM Decadron, booked her for 2 nurse only visits to receive IM Decadron 10 mg for COPD exacerbation coverage.  First on Thursday 8/17 at 10 AM and second on Monday 8/21 at 10 AM.  Future orders for IM Decadron placed x2 and timed appropriately for the above nurse visits.  She reports she was able to get the Dilaudid from her pharmacy after our appointment yesterday.  She says that she still has the pain, but she is not hurting as bad as she was previously.  We will still follow the plan of 2 mg every 4 to 6 with increase to 3 mg every 4 to 6 if we need better pain control in a day or two.   Ms. Rockhold voiced understanding and is happy with the plan.  [name], MD

## 2022-04-28 ENCOUNTER — Ambulatory Visit (INDEPENDENT_AMBULATORY_CARE_PROVIDER_SITE_OTHER): Payer: Medicare Other

## 2022-04-28 DIAGNOSIS — J449 Chronic obstructive pulmonary disease, unspecified: Secondary | ICD-10-CM | POA: Diagnosis not present

## 2022-04-28 DIAGNOSIS — J441 Chronic obstructive pulmonary disease with (acute) exacerbation: Secondary | ICD-10-CM | POA: Diagnosis not present

## 2022-04-28 NOTE — Progress Notes (Signed)
Patient presents to nurse clinic for Decadron injection per [name].   Administered in LD, site unremarkable, tolerated injection well. Patient observed for 15 minutes post injection. No signs of adverse reaction.   Patient will return to nurse clinic on Monday, 8/21 for second decadron injection.   Talbot Grumbling, RN

## 2022-04-29 ENCOUNTER — Other Ambulatory Visit: Payer: Self-pay | Admitting: Family Medicine

## 2022-05-02 ENCOUNTER — Ambulatory Visit: Payer: Medicare Other

## 2022-05-03 ENCOUNTER — Ambulatory Visit (INDEPENDENT_AMBULATORY_CARE_PROVIDER_SITE_OTHER): Payer: Medicare Other

## 2022-05-03 DIAGNOSIS — J449 Chronic obstructive pulmonary disease, unspecified: Secondary | ICD-10-CM

## 2022-05-03 DIAGNOSIS — J441 Chronic obstructive pulmonary disease with (acute) exacerbation: Secondary | ICD-10-CM | POA: Diagnosis not present

## 2022-05-03 NOTE — Progress Notes (Signed)
Patient presents to nurse clinic for Decadron injection per [name].    Administered in RD, site unremarkable, tolerated injection well. Patient observed for 15 minutes post injection. No signs of adverse reaction.    Patient reminded of follow up apt with PCP tomorrow 8/23.

## 2022-05-04 ENCOUNTER — Encounter: Payer: Self-pay | Admitting: Family Medicine

## 2022-05-04 ENCOUNTER — Ambulatory Visit (INDEPENDENT_AMBULATORY_CARE_PROVIDER_SITE_OTHER): Payer: Medicare Other | Admitting: Family Medicine

## 2022-05-04 VITALS — BP 141/72 | HR 84 | Ht 69.0 in | Wt 189.2 lb

## 2022-05-04 DIAGNOSIS — R0781 Pleurodynia: Secondary | ICD-10-CM | POA: Diagnosis not present

## 2022-05-04 DIAGNOSIS — Z923 Personal history of irradiation: Secondary | ICD-10-CM | POA: Diagnosis not present

## 2022-05-04 DIAGNOSIS — J441 Chronic obstructive pulmonary disease with (acute) exacerbation: Secondary | ICD-10-CM

## 2022-05-04 MED ORDER — HYDROMORPHONE HCL 2 MG PO TABS: 2.0000 mg | ORAL_TABLET | Freq: Three times a day (TID) | ORAL | 0 refills | Status: DC

## 2022-05-04 NOTE — Patient Instructions (Signed)
It was wonderful to see you today. Thank you for allowing me to be a part of your care. Below is a short summary of what we discussed at your visit today:  Postradiation rib pain We are going to increase your Dilaudid.  Take 3 mg (1.5 tablets) 3 times daily.  If you tolerate this well after several days but still need more pain relief, increase to 4 mg (2 tablets) 3 times daily.  Make an appointment to come back in 1 week to see how this is doing.  If you are still not getting enough relief to function well, we will consider fentanyl patch at that time.  Pulmonology Your pulmonologist [name], however you have been seeing Dr. Marland Kitchen, a nurse practitioner, frequently.   [facility] at [address] [place], [place] [address] [phone]   Radiation oncology The oncologist who coordinated your radiation is Dr. Lorna Few at the [facility] for medical oncology.  [place] at [address], [place] [address] [phone]   Please bring all of your medications to every appointment!  If you have any questions or concerns, please do not hesitate to contact us via phone or MyChart message.   [name], MD

## 2022-05-04 NOTE — Assessment & Plan Note (Signed)
Previous episode from last appointment early August successfully treated with Augmentin and IM decadron x2 (in RN clinic). No adverse s/e to decadron as she experiences with prednisone. Physical exam today improved greatly from last in terms of wheezing and work of breathing. Rib pain persists especially with coughing, see above for more.

## 2022-05-04 NOTE — Progress Notes (Signed)
    SUBJECTIVE:   CHIEF COMPLAINT / HPI:   Ongoing left rib pain Thought to be secondary to radiation treatment for primary adenocarcinoma of left lower lung lobe. Last seen at [facility] by myself 8/14.  At that time, I increased her gabapentin to 600 mg a.m., 600 mg afternoon, 900 mg nightly; stopped Percocet; started Dilaudid p.o.  Patient is currently taking Dilaudid 3 mg twice daily, once in the morning and once in the evening.  She reports that this is helping a little bit but that she is still severely limited in her daily activities by this pain.  COPD exacerbation At this last appointment, I also treated her for COPD exacerbation with Augmentin and Decadron IM x2. The chest XR was negative for pneumonia. She reports improvement with phlegm production and modest improvement in cough. She has finished the treatment as prescribed without missing doses.   PERTINENT  PMH / PSH: Primary lung adenocarcinoma of left lower lobe s/p radiation, bladder cancer s/p resection Feb 2022, gold stage III COPD, HLD, costochondritis, osteopenia  OBJECTIVE:   BP (!) 141/72   Pulse 84   Ht 5\' 9"  (1.753 m)   Wt 189 lb 3.2 oz (85.8 kg)   SpO2 95%   BMI 27.94 kg/m    PHQ-9:     05/04/2022   11:17 AM 04/13/2022   11:30 AM 03/11/2022    1:31 PM  Depression screen PHQ 2/9  Decreased Interest 2  0  Down, Depressed, Hopeless 0  1  PHQ - 2 Score 2  1  Altered sleeping 2 2 1   Tired, decreased energy 2 2 0  Change in appetite 2  0  Feeling bad or failure about yourself  0  0  Trouble concentrating 0  0  Moving slowly or fidgety/restless 0  0  Suicidal thoughts 0  0  PHQ-9 Score 8  2    Physical Exam General: Awake, alert, oriented Cardiovascular: Regular rate and rhythm, S1 and S2 present, no murmurs auscultated Respiratory: Minimal end expiratory wheezing in bilateral superior fields  ASSESSMENT/PLAN:   Rib pain Patient currently taking Dilaudid 3 mg BID, and tolerating well without nausea or  itching. Does make her sleepy but she can still function and perform ADLs. Does say it helps a "tiny bit" but is still limited in her daily activities. Plan to increase Dilaudid. Our goal is still to provide some relief while she awaits PMR in October. - 3 mg TID for several days - if requiring more pain control, increase to 4 mg TID - follow up in one week - if inadequate pain control but tolerable sedation, can continue to increase dilaudid carefully; would also consider fentanyl patch (dosed based on total daily MME from last dilaudid dose) - discuss trigger point injections of left posterior ribs  COPD with acute exacerbation ([place]) Previous episode from last appointment early August successfully treated with Augmentin and IM decadron x2 (in RN clinic). No adverse s/e to decadron as she experiences with prednisone. Physical exam today improved greatly from last in terms of wheezing and work of breathing. Rib pain persists especially with coughing, see above for more.      [name], MD [place]

## 2022-05-04 NOTE — Assessment & Plan Note (Signed)
Patient currently taking Dilaudid 3 mg BID, and tolerating well without nausea or itching. Does make her sleepy but she can still function and perform ADLs. Does say it helps a "tiny bit" but is still limited in her daily activities. Plan to increase Dilaudid. Our goal is still to provide some relief while she awaits PMR in October. - 3 mg TID for several days - if requiring more pain control, increase to 4 mg TID - follow up in one week - if inadequate pain control but tolerable sedation, can continue to increase dilaudid carefully; would also consider fentanyl patch (dosed based on total daily MME from last dilaudid dose) - discuss trigger point injections of left posterior ribs

## 2022-05-13 ENCOUNTER — Ambulatory Visit (INDEPENDENT_AMBULATORY_CARE_PROVIDER_SITE_OTHER): Payer: Medicare Other | Admitting: Family Medicine

## 2022-05-13 ENCOUNTER — Encounter: Payer: Self-pay | Admitting: Family Medicine

## 2022-05-13 VITALS — BP 137/71 | HR 90 | Ht 69.0 in | Wt 186.8 lb

## 2022-05-13 DIAGNOSIS — R0781 Pleurodynia: Secondary | ICD-10-CM

## 2022-05-13 MED ORDER — NALOXONE HCL 4 MG/0.1ML NA LIQD: NASAL | 4 refills | Status: DC

## 2022-05-13 MED ORDER — FENTANYL 25 MCG/HR TD PT72: 1.0000 | MEDICATED_PATCH | TRANSDERMAL | 0 refills | Status: DC

## 2022-05-13 NOTE — Patient Instructions (Signed)
It was wonderful to see you today. Thank you for allowing me to be a part of your care. Below is a short summary of what we discussed at your visit today:  Rib pain Stop taking the Dilaudid.  Start the fentanyl patch.  Apply 1 patch.  It will last for 3 days.  At the end of 3 days, take off the old patch before you apply a new one.  Please refer to the information packet provided.  Please asked the pharmacist any questions you may have about how to use this medication.  I have sent 2 more Narcan nasal sprays to your pharmacy.  Please pick these up when you pick up the fentanyl.  Take them with you wherever you go, take when in your purse in in your suitcase.  This is for your family members to use if they believe you have experienced an overdose.  If you are too sleepy to wake up or have difficulty breathing, they should apply this no spray into your nose.  I have referred you to interventional radiology for consideration of a nerve block or nerve ablation.  Someone from their office should be calling you in 1 to 2 weeks to schedule an appointment.  If you do not hear from them, let us know. We may need to nudge along the referral.      Please bring all of your medications to every appointment!  If you have any questions or concerns, please do not hesitate to contact us via phone or MyChart message.   [name], MD

## 2022-05-13 NOTE — Progress Notes (Signed)
    SUBJECTIVE:   CHIEF COMPLAINT / HPI:   Post radiation rib pain Last seen by myself at [facility] 05/04/2022 for ongoing postradiation rib pain.  At that time, we increased her p.o. Dilaudid to 3 to 4 mg 3 times daily, continued gabapentin 600 am / 600 midday / 900 QHS.  Today, patient reports that the Dilaudid 4 mg 3 times daily is not really helping that much.  She still experiences quite a bit of rib pain.  Since her last visit, I have reached out to her radiation oncologist and pulmonologist for suggestions.  Given the number of oral medications we have tried without success, they both recommended considering nerve block or ablation.  PERTINENT  PMH / PSH: Primary lung adenocarcinoma of left lower lobe s/p radiation, bladder cancer s/p resection Feb 2022, gold stage III COPD, HLD, costochondritis, osteopenia   OBJECTIVE:   BP 137/71   Pulse 90   Ht 5\' 9"  (1.753 m)   Wt 186 lb 12.8 oz (84.7 kg)   SpO2 100%   BMI 27.59 kg/m    PHQ-9:     05/13/2022    1:54 PM 05/04/2022   11:17 AM 04/13/2022   11:30 AM  Depression screen PHQ 2/9  Decreased Interest 0 2   Down, Depressed, Hopeless 0 0   PHQ - 2 Score 0 2   Altered sleeping 2 2 2   Tired, decreased energy 2 2 2   Change in appetite 0 2   Feeling bad or failure about yourself  0 0   Trouble concentrating 0 0   Moving slowly or fidgety/restless 0 0   Suicidal thoughts 0 0   PHQ-9 Score 4 8    Physical Exam General: Awake, alert, no acute distress Respiratory: Unlabored respirations, speaking clearly in full sentences  ASSESSMENT/PLAN:   Post radiation rib pain Chronic, uncontrolled.  Patient currently reporting Dilaudid 4 mg 3 times daily is tolerable in terms of side effects and sedation, but not providing enough relief to make the pain manageable.  Calculated MME from Dilaudid p.o. is 48 MME/day. - DC Dilaudid p.o. - Trial fentanyl patch 25 mcg/h (provides 60 MME per day, closest daily dose to previous Dilaudid) - Continue  gabapentin 600 am / 600 midday / 900 QHS - Prescription for 2 more nasal Narcan sprays, given that patient is leaving for vacation and does not have any with her - Referral to IR for nerve block +/- discussion of possible nerve ablation - Plan to keep PMR appointment in October, these current measures are only an effort to manage pain as best we can until [place], MD [place]

## 2022-05-13 NOTE — Assessment & Plan Note (Addendum)
Chronic, uncontrolled.  Patient currently reporting Dilaudid 4 mg 3 times daily is tolerable in terms of side effects and sedation, but not providing enough relief to make the pain manageable.  Calculated MME from Dilaudid p.o. is 48 MME/day. - DC Dilaudid p.o. - Trial fentanyl patch 25 mcg/h (provides 60 MME per day, closest daily dose to previous Dilaudid) - Continue gabapentin 600 am / 600 midday / 900 QHS - Prescription for 2 more nasal Narcan sprays, given that patient is leaving for vacation and does not have any with her - Referral to IR for nerve block +/- discussion of possible nerve ablation - Plan to keep PMR appointment in October, these current measures are only an effort to manage pain as best we can until PMR

## 2022-05-20 ENCOUNTER — Telehealth: Payer: Self-pay

## 2022-05-20 ENCOUNTER — Other Ambulatory Visit: Payer: Self-pay | Admitting: Family Medicine

## 2022-05-20 DIAGNOSIS — R0781 Pleurodynia: Secondary | ICD-10-CM

## 2022-05-20 MED ORDER — FENTANYL 25 MCG/HR TD PT72: 1.0000 | MEDICATED_PATCH | TRANSDERMAL | 0 refills | Status: DC

## 2022-05-20 NOTE — Telephone Encounter (Signed)
Returned patient's call.   Patient reported her fentanyl patch came off in the shower and she is requesting guidance on placement of next one.  She reports that she looked everywhere on her body, with the help of her mom, and that she can confidently say the patch is no longer on her.  We discussed that it is safe at this point (given there is no patch on her body) to place her next patch on.  She reports overall the fentanyl patch is working well and making her pain much more manageable.  She is tolerating it well without any extra sedation.  She reports her feet swelled up a bit at the beginning, but have since returned to normal.  We discussed safety precautions for visitors, including small children.  We discussed Narcan administration for anybody who may have been accidentally exposed to the missing fentanyl patch, including children.  She currently has 4 Narcan spray bottles and keeps at least 1 in her house.  Will send in new script for one month supply. PDMP reviewed and appropriate.   [name], MD

## 2022-05-20 NOTE — Telephone Encounter (Signed)
Patient calls nurse line regarding concern with fentanyl patches. Patient reports that she placed patch last night around 2030. She took a shower this afternoon at around 1530 and has not been able to find patch since.   She is asking if she should place another patch or how she should proceed.   Spoke with Dr. Owens Shark who asked that message be sent to PCP for further advisement.   Talbot Grumbling, RN

## 2022-05-25 ENCOUNTER — Ambulatory Visit (HOSPITAL_COMMUNITY)
Admission: RE | Admit: 2022-05-25 | Discharge: 2022-05-25 | Disposition: A | Discharge status: Home / Self Care | Payer: Medicare Other | Source: Ambulatory Visit | Attending: Radiation Oncology | Admitting: Radiation Oncology

## 2022-05-25 ENCOUNTER — Ambulatory Visit (INDEPENDENT_AMBULATORY_CARE_PROVIDER_SITE_OTHER): Payer: Medicare Other | Admitting: Pulmonary Disease

## 2022-05-25 VITALS — BP 110/60 | HR 84 | Ht 68.0 in | Wt 190.4 lb

## 2022-05-25 DIAGNOSIS — R911 Solitary pulmonary nodule: Secondary | ICD-10-CM | POA: Diagnosis not present

## 2022-05-25 DIAGNOSIS — J449 Chronic obstructive pulmonary disease, unspecified: Secondary | ICD-10-CM

## 2022-05-25 DIAGNOSIS — Z87891 Personal history of nicotine dependence: Secondary | ICD-10-CM | POA: Diagnosis not present

## 2022-05-25 DIAGNOSIS — C3432 Malignant neoplasm of lower lobe, left bronchus or lung: Secondary | ICD-10-CM | POA: Insufficient documentation

## 2022-05-25 DIAGNOSIS — I700 Atherosclerosis of aorta: Secondary | ICD-10-CM | POA: Diagnosis not present

## 2022-05-25 DIAGNOSIS — J439 Emphysema, unspecified: Secondary | ICD-10-CM | POA: Diagnosis not present

## 2022-05-25 DIAGNOSIS — C3492 Malignant neoplasm of unspecified part of left bronchus or lung: Secondary | ICD-10-CM

## 2022-05-25 DIAGNOSIS — C3490 Malignant neoplasm of unspecified part of unspecified bronchus or lung: Secondary | ICD-10-CM | POA: Diagnosis not present

## 2022-05-25 DIAGNOSIS — J432 Centrilobular emphysema: Secondary | ICD-10-CM | POA: Diagnosis not present

## 2022-05-25 NOTE — Patient Instructions (Addendum)
Thank you for visiting [name] at [facility]. Today we recommend the following:  Continue breztri inhaler regimen Continue albuterol as needed   Return in about 1 year (around 05/26/2023) for with Eric Form, NP, or [name].    Please do your part to reduce the spread of COVID-19.

## 2022-05-25 NOTE — Progress Notes (Signed)
Synopsis: Referred in November 2021 for abnormal CT chest .  By [name], MD  Subjective:   PATIENT ID: [name] GENDER: female DOB: Dec 26, 1955, MRN: [id number]  Chief Complaint  Patient presents with   Follow-up    this is a 66 year old female, past medical history of depression, anxiety, current tobacco abuse.  She initially seen by her primary care provider which encouraged her to have a lung cancer screening CT.  She had this completed in October 2021.  The CT scan revealed bilateral lower lobe cystic peripheral based lesions both greater than 4 cm in size concerning for multifocal adenocarcinoma.  Patient has no respiratory symptoms at this time.  She is a current smoker.  She has smoked for 41 years at approximately 1 pack/day.  She presents today in the office with her husband and her daughter via phone.  Long discussion today regarding CT imaging as well as next appropriate steps.  Patient denies fevers chills night sweats weight loss or hemoptysis.  OV 08/28/2020: Patient here today for follow-up after recent bronchoscopy.  Patient has a primary left lower lobe adenocarcinoma and likely has a carcinoma in the left lower lobe however tissue biopsy was inconclusive on this side both are very irregular cystic shaped lesions concerning for multifocal adenocarcinoma.  PET scan was relatively equivocal with him low to moderate SUV uptake.  Patient was referred to thoracic surgery.  Case was discussed with Dr. Kipp Brood today because she was seen earlier today in the office.  She is also been referred for evaluation of an irregular shaped bladder lesion that was found on PET scan.  Pulmonary function tests have also been completed during this time and PFTs revealed a ratio of 57, a FEV1 of 1.2 L, 57% predicted evidence of air trapping with an RV of 133% and a DLCO of 41.  Today, patient is very anxious.  She met with thoracic surgery today.  Tearful today in the office about everything  that is been going on.  Also worried about the possible bladder lesion that was found on PET scan.  OV 01/20/2021: Patient here today for follow-up regarding complaints of left-sided chest pain.  This happens to be predominantly associated with big deep breaths moving around.  She can pinpoint to the pain that is located just underneath her left bra line and underneath her left breast.  She feels like the skin is tender right there.  She is not really done many things over-the-counter to see if it makes a difference.  But she says it comes on and off occasionally throughout the day.  She has been using her inhaler.  She has not had any recent follow-up with radiation oncology.  She was seen in the emergency department in April for similar complaints of chest pain.  She had a CTA of the chest that was negative for PE.  Also reviewed these images today with patient in the office after her 10 treatments of SBRT to the left lower lobe adenocarcinoma.  The right cystic-appearing lesion appears stable in comparison to previous imaging.  OV 02/15/2021: Here today for follow-up.  Overall from respiratory standpoint doing well using her inhalers.  Had recent follow-up with radiation oncology.  She does have a CT scan planned for October 2022.  I discussed patient's case with radiation oncology a few weeks ago.  She is feeling less anxious.  Overall in a better mood in comparison to where she was several months ago with new diagnosis and  abnormal imaging.  OV 07/21/2021: Here today for follow up regarding. Recently had ct imaging of the chest in September 2022. The right lower lobe lesion that we were following has increased slightly in size. She tolerated the SBRT to the LLL lesion well. Patient denies fevers chils, weight loss. She has quit smoking.   OV 01/19/2022: This is a 66 year old female here today for follow-up.  Recent CT scan of the chest in April shows stability of the right lower lobe cavity.  History of  adenocarcinoma on the left side status post radiation.  Radiation scarring is stable.  From respiratory standpoint she is doing well with no complaints today.  We reviewed her CT scan today in the office.  Also reviewed office note from 12/30/2021 with Dr. Sondra Come.  OV 03/07/2022: Patient here today for follow-up after recent CT scan of the chest.  Very anxious still about this right lower lobe with cystic lesion.  She still having left-sided rib pain.  She had CT imaging of the chest which shows no fracture.  Also had x-ray imaging.  She is very concerned about this lesion.  We had a long discussion today with her sister today in the office.  OV 05/25/2022: Here today for follow-up after recently being treated for community-acquired pneumonia.  Also since the last time we met I discussed her pain management issues with radiation oncology and her primary care provider.  Recommended consideration for referral to interventional pain.  She was placed on a fentanyl patch.  Pain is better controlled.  She still has daily sputum production.  She is using her Breztri inhaler with as needed albuterol.      Past Medical History:  Diagnosis Date   Abnormal CT lung screening 07/10/2020   Anxiety    Arthritis    Bladder cancer ([place]) 09/2020   urologist--- dr Tresa Moore, incidental finding on PET scan 12/ 2021,  s/p TURBT 09-18-2020 high grade papillary urothelial    Centrilobular emphysema ([place])    Close exposure to COVID-19 virus 05/03/2019   Constipation 04/09/2019   Costochondritis, acute 08/14/2020   DDD (degenerative disc disease), lumbar    Depression    DOE (dyspnea on exertion)    10-29-2020  per pt can do house work without sob, when walks/ stairs stops frequently  (stated recovers quickly when stops)   Emphysema lung ([place])    Epidermoid cyst 10/29/2018   Recurrent. Located on left hip at iliac crest 2" anterior to midaxillary line. Removed 2013, 2020, 2022.    Epigastric pain 11/16/2020   Fibroid  2003   GERD (gastroesophageal reflux disease)    History of radiation therapy    Left lung- 10/06/20-10/16/20- Dr. Gery Pray   Leg cramping 11/13/2019   Menopausal vaginal dryness 11/13/2019   Primary adenocarcinoma of lower lobe of left lung ([place]) 07/2020   oncologist--- dr Julien Nordmann---  dx 11/ 2021 bilateral lower lobe masses,  s/p bronchoscopy w/ bx's 07-28-2020 malignancy cells on left ;  completed SBRT bilateral lower lung 10-16-2020 5 fractions   Radiation-induced dermatitis    on 10-29-2020 pt complaint stomach and chest areas,  completed SBRT 10-16-2020   Seasonal allergies    Stage 3 severe COPD by GOLD classification [facility])    pulmonologist--- dr Valeta Harms,  using anoro inhaler daily   Wears glasses      Family History  Problem Relation Age of Onset   Hypertension Mother    Hyperlipidemia Mother    Dementia Father    Lung cancer Father  Parkinson's disease Father    Early death Sister 33       Drug overdose   Throat cancer Brother    Hypertension Daughter    Diabetes Daughter      Past Surgical History:  Procedure Laterality Date   ANTERIOR CERVICAL DECOMP/DISCECTOMY FUSION  03-08-2003 @ [place]   C3 --- C6   ARTHROTOMY Right 11/09/2015   Procedure: RIGHT WRIST DISTAL RADIAL ULNAR JOINT ARTHROTOMY AND DEBRIDEMENT,  AND ;  Surgeon: Iran Planas, MD;  Location: [place];  Service: Orthopedics;  Laterality: Right;   BRONCHIAL BIOPSY  07/28/2020   Procedure: BRONCHIAL BIOPSIES;  Surgeon: [name], DO;  Location: [place];  Service: Pulmonary;;   BRONCHIAL BIOPSY  08/17/2021   Procedure: BRONCHIAL BIOPSIES;  Surgeon: [name], DO;  Location: [place] [facility];  Service: Pulmonary;;   BRONCHIAL BRUSHINGS  07/28/2020   Procedure: BRONCHIAL BRUSHINGS;  Surgeon: [name], DO;  Location: [place] [facility];  Service: Pulmonary;;   BRONCHIAL BRUSHINGS  08/17/2021   Procedure: BRONCHIAL BRUSHINGS;  Surgeon: [name], DO;  Location: [place];  Service:  Pulmonary;;   BRONCHIAL NEEDLE ASPIRATION BIOPSY  07/28/2020   Procedure: BRONCHIAL NEEDLE ASPIRATION BIOPSIES;  Surgeon: [name], DO;  Location: [place];  Service: Pulmonary;;   BRONCHIAL WASHINGS  07/28/2020   Procedure: BRONCHIAL WASHINGS;  Surgeon: [name], DO;  Location: [place];  Service: Pulmonary;;   BRONCHIAL WASHINGS  08/17/2021   Procedure: BRONCHIAL WASHINGS;  Surgeon: [name], DO;  Location: [place];  Service: Pulmonary;;   COLONOSCOPY     CYSTOSCOPY WITH RETROGRADE PYELOGRAM, URETEROSCOPY AND STENT PLACEMENT N/A 09/18/2020   Procedure: CYSTOSCOPY WITH RETROGRADE PYELOGRAM bilateral,URETEROSCOPY AND  STENT PLACEMENT right ureter;  Surgeon: Alexis Frock, MD;  Location: [facility];  Service: Urology;  Laterality: N/A;  1 HR   HARDWARE REMOVAL Right 07/24/2013   Procedure: RIGHT WRIST HARDWARE REMOVAL, JOINT RELEASE, RIGHT HAND MANIPULATION UNDER ANESTHESIA;  Surgeon: Linna Hoff, MD;  Location: [place];  Service: Orthopedics;  Laterality: Right;   MASS EXCISION Left 10/14/2021   Procedure: EXCISION LEFT BACK CYSTIC MASS;  Surgeon: Kieth Brightly, Arta Bruce, MD;  Location: [place];  Service: General;  Laterality: Left;   OPEN REDUCTION INTERNAL FIXATION (ORIF) DISTAL RADIAL FRACTURE Right 11/08/2012   Procedure: OPEN REDUCTION INTERNAL FIXATION (ORIF) RIGHT DISTAL RADIUS FRACTURE;  Surgeon: Linna Hoff, MD;  Location: [place];  Service: Orthopedics;  Laterality: Right;   TRANSURETHRAL RESECTION OF BLADDER TUMOR N/A 09/18/2020   Procedure: TRANSURETHRAL RESECTION OF BLADDER TUMOR (TURBT);  Surgeon: Alexis Frock, MD;  Location: [facility];  Service: Urology;  Laterality: N/A;   TRANSURETHRAL RESECTION OF BLADDER TUMOR N/A 11/04/2020   Procedure: RESTAGING TRANSURETHRAL RESECTION OF BLADDER TUMOR (TURBT); BILATERAL RETROGRADE PYELOGRAM;  Surgeon: Alexis Frock, MD;  Location: [facility];  Service: Urology;  Laterality: N/A;  1 HR   VIDEO BRONCHOSCOPY WITH ENDOBRONCHIAL NAVIGATION N/A 07/28/2020   Procedure: VIDEO BRONCHOSCOPY WITH ENDOBRONCHIAL NAVIGATION;  Surgeon: [name], DO;  Location: [place];  Service: Pulmonary;  Laterality: N/A;   VIDEO BRONCHOSCOPY WITH RADIAL ENDOBRONCHIAL ULTRASOUND  08/17/2021   Procedure: VIDEO BRONCHOSCOPY WITH RADIAL ENDOBRONCHIAL ULTRASOUND;  Surgeon: [name], DO;  Location: [place];  Service: Pulmonary;;   WRIST ARTHROPLASTY Right 11/09/2015   Procedure: OR DISTAL ULNAR RESECTION ARTHROPLASTY ;  Surgeon: Iran Planas, MD;  Location: [place];  Service: Orthopedics;  Laterality: Right;  WRIST ARTHROSCOPY WITH DEBRIDEMENT Right 07/21/2014   Procedure: RIGHT WRIST DISTAL RADIAL ULNAR JOINT DEBRIDEMENT AND JOINT RELEASE POSSIBLE TENDON INTERPOSITION;  Surgeon: Linna Hoff, MD;  Location: [place];  Service: Orthopedics;  Laterality: Right;    Social History   Socioeconomic History   Marital status: Married    Spouse name: Mallie Mussel Hocutt   Number of children: 2   Years of education: 12   Highest education level: Not on file  Occupational History   Occupation: Unemployed     Employer:  [employer]    Comment: Sweep   Tobacco Use   Smoking status: Former    Packs/day: 1.00    Years: 50.00    Total pack years: 50.00    Types: Cigarettes    Start date: 04/13/1975    Quit date: 07/14/2020    Years since quitting: 1.8    Passive exposure: Past   Smokeless tobacco: Never  Vaping Use   Vaping Use: Never used  Substance and Sexual Activity   Alcohol use: Yes    Comment: Holidays only   Drug use: Never   Sexual activity: Not Currently    Partners: Male    Birth control/protection: Post-menopausal  Other Topics Concern   Not on file  Social History Narrative   Patient lives alone currently in [place].    Patient is married, they are living separately at this time.    Patient enjoys watching tv, cooking, and  spending time with her children and grandchildren.    Social Determinants of Health   Financial Resource Strain: Low Risk  (09/26/2019)   Overall Financial Resource Strain (CARDIA)    Difficulty of Paying Living Expenses: Not hard at all  Food Insecurity: No Food Insecurity (09/26/2019)   Hunger Vital Sign    Worried About Running Out of Food in the Last Year: Never true    Ran Out of Food in the Last Year: Never true  Transportation Needs: No Transportation Needs (09/26/2019)   PRAPARE - Hydrologist (Medical): No    Lack of Transportation (Non-Medical): No  Physical Activity: Inactive (09/26/2019)   Exercise Vital Sign    Days of Exercise per Week: 0 days    Minutes of Exercise per Session: 0 min  Stress: No Stress Concern Present (09/26/2019)   [place]    Feeling of Stress : Not at all  Social Connections: Moderately Isolated (09/26/2019)   Social Connection and Isolation Panel [NHANES]    Frequency of Communication with Friends and Family: More than three times a week    Frequency of Social Gatherings with Friends and Family: More than three times a week    Attends Religious Services: Never    Marine scientist or Organizations: No    Attends Archivist Meetings: Never    Marital Status: Married  Human resources officer Violence: Not At Risk (09/26/2019)   Humiliation, Afraid, Rape, and Kick questionnaire    Fear of Current or Ex-Partner: No    Emotionally Abused: No    Physically Abused: No    Sexually Abused: No     Allergies  Allergen Reactions   Codeine Nausea And Vomiting and Other (See Comments)    Hallucinations   Prednisone Other (See Comments)    'Made me feel funny inside my head"-pt cannot be specific     Outpatient Medications Prior to Visit  Medication Sig Dispense Refill   albuterol (VENTOLIN HFA)  108 (90 Base) MCG/ACT inhaler USE 2 INHALATIONS BY MOUTH   INTO THE LUNGS EVERY 6  HOURS AS NEEDED FOR  SHORTNESS OF BREATH OR  WHEEZING 26.8 g 2   Budeson-Glycopyrrol-Formoterol (BREZTRI AEROSPHERE) 160-9-4.8 MCG/ACT AERO Inhale 2 puffs into the lungs in the morning and at bedtime. 10.7 g 5   fluticasone (FLONASE) 50 MCG/ACT nasal spray SHAKE LIQUID AND USE 2 SPRAYS IN EACH NOSTRIL DAILY 16 g 6   atorvastatin (LIPITOR) 40 MG tablet Take 1 tablet (40 mg total) by mouth at bedtime. 90 tablet 3   baclofen (LIORESAL) 10 MG tablet Take 0.5 tablets (5 mg total) by mouth 3 (three) times daily. (Patient taking differently: Take 5 mg by mouth 3 (three) times daily as needed for muscle spasms.) 30 each 0   diclofenac Sodium (VOLTAREN) 1 % GEL Apply 2 g topically 4 (four) times daily. Apply to painful area of ribs. Need to use regularly for relief. 100 g 2   DULoxetine (CYMBALTA) 30 MG capsule Take 1 capsule (30 mg total) by mouth daily. 30 capsule 3   fentaNYL (DURAGESIC) 25 MCG/HR Place 1 patch onto the skin every 3 (three) days. 10 patch 0   gabapentin (NEURONTIN) 300 MG capsule Take 2 capsules (600 mg total) by mouth 2 (two) times daily AND 3 capsules (900 mg total) at bedtime. 60 capsule 1   HYDROmorphone (DILAUDID) 2 MG tablet Take 1 tablet (2 mg total) by mouth in the morning, at noon, and at bedtime. 42 tablet 0   hydrOXYzine (ATARAX) 50 MG tablet Take 1 tablet (50 mg total) by mouth at bedtime as needed for anxiety or itching (sleep). 90 tablet 2   ibuprofen (ADVIL) 800 MG tablet Take 1 tablet (800 mg total) by mouth every 8 (eight) hours as needed. 30 tablet 0   MELATONIN GUMMIES PO Take 10 mg by mouth at bedtime.     naloxone (NARCAN) nasal spray 4 mg/0.1 mL Use for suspected opioid overdose - too sleepy to wake up, difficulty breathing 2 each 4   ondansetron (ZOFRAN) 4 MG tablet Take 1 tablet (4 mg total) by mouth every 8 (eight) hours as needed for nausea or vomiting. 30 tablet 1   pantoprazole (PROTONIX) 40 MG tablet Take 1 tablet (40 mg total) by mouth  daily as needed (acid indigestion/heartburn.). 30 tablet 2   No facility-administered medications prior to visit.    Review of Systems  Constitutional:  Negative for chills, fever, malaise/fatigue and weight loss.  HENT:  Negative for hearing loss, sore throat and tinnitus.   Eyes:  Negative for blurred vision and double vision.  Respiratory:  Positive for shortness of breath. Negative for cough, hemoptysis, sputum production, wheezing and stridor.   Cardiovascular:  Negative for chest pain, palpitations, orthopnea, leg swelling and PND.  Gastrointestinal:  Negative for abdominal pain, constipation, diarrhea, heartburn, nausea and vomiting.  Genitourinary:  Negative for dysuria, hematuria and urgency.  Musculoskeletal:  Negative for joint pain and myalgias.  Skin:  Negative for itching and rash.  Neurological:  Negative for dizziness, tingling, weakness and headaches.  Endo/Heme/Allergies:  Negative for environmental allergies. Does not bruise/bleed easily.  Psychiatric/Behavioral:  Negative for depression. The patient is not nervous/anxious and does not have insomnia.   All other systems reviewed and are negative.    Objective:  Physical Exam Vitals reviewed.  Constitutional:      General: She is not in acute distress.    Appearance: She is well-developed.  HENT:  Head: Normocephalic and atraumatic.  Eyes:     General: No scleral icterus.    Conjunctiva/sclera: Conjunctivae normal.     Pupils: Pupils are equal, round, and reactive to light.  Neck:     Vascular: No JVD.     Trachea: No tracheal deviation.  Cardiovascular:     Rate and Rhythm: Normal rate and regular rhythm.     Heart sounds: Normal heart sounds. No murmur heard. Pulmonary:     Effort: Pulmonary effort is normal. No tachypnea, accessory muscle usage or respiratory distress.     Breath sounds: No stridor. No wheezing, rhonchi or rales.     Comments: Diminished breath sounds bilaterally Abdominal:      General: There is no distension.     Palpations: Abdomen is soft.     Tenderness: There is no abdominal tenderness.  Musculoskeletal:        General: No tenderness.     Cervical back: Neck supple.  Lymphadenopathy:     Cervical: No cervical adenopathy.  Skin:    General: Skin is warm and dry.     Capillary Refill: Capillary refill takes less than 2 seconds.     Findings: No rash.  Neurological:     Mental Status: She is alert and oriented to person, place, and time.  Psychiatric:        Behavior: Behavior normal.      Vitals:   05/25/22 1332  BP: 110/60  Pulse: 84  SpO2: 96%  Weight: 190 lb 6.4 oz (86.4 kg)  Height: _0  (1.727 m)   96% on RA BMI Readings from Last 3 Encounters:  05/25/22 28.95 kg/m  05/13/22 27.59 kg/m  05/04/22 27.94 kg/m   Wt Readings from Last 3 Encounters:  05/25/22 190 lb 6.4 oz (86.4 kg)  05/13/22 186 lb 12.8 oz (84.7 kg)  05/04/22 189 lb 3.2 oz (85.8 kg)     CBC    Component Value Date/Time   WBC 6.6 08/17/2021 0715   RBC 4.99 08/17/2021 0715   HGB 13.9 08/17/2021 0715   HGB 13.1 08/18/2020 1348   HGB 14.1 06/23/2020 1237   HCT 43.3 08/17/2021 0715   HCT 43.6 06/23/2020 1237   PLT 366 08/17/2021 0715   PLT 360 08/18/2020 1348   PLT 366 06/23/2020 1237   MCV 86.8 08/17/2021 0715   MCV 86 06/23/2020 1237   MCH 27.9 08/17/2021 0715   MCHC 32.1 08/17/2021 0715   RDW 13.6 08/17/2021 0715   RDW 13.2 06/23/2020 1237   LYMPHSABS 1.3 12/17/2020 1029   LYMPHSABS 2.2 06/23/2020 1237   MONOABS 0.7 12/17/2020 1029   EOSABS 0.2 12/17/2020 1029   EOSABS 0.2 06/23/2020 1237   BASOSABS 0.1 12/17/2020 1029   BASOSABS 0.1 06/23/2020 1237     Chest Imaging:  Lung cancer screening CT October 2021: Bilateral cystic appearing masses concerning for multifocal adenocarcinoma. The patient's images have been independently reviewed by me.    CT chest 12/17/2020: Stable cystic lesion within the right base, left lower lobe cystic subsolid  lesion consistent with previous adenocarcinoma is stable.  Also has associated moderate emphysema.. The patient's images have been independently reviewed by me.    CT Chest September 2022: RLL cystic lesion. Slightly larger in size  The patient's images have been independently reviewed by me.    CT chest June 2023: Right lower lobe cystic lesion relatively stable in size Scarring from the left lower lobe and some pleural thickening. Small pulmonary nodule in the right  side. The patient's images have been independently reviewed by me.     Pulmonary Functions Testing Results:    Latest Ref Rng & Units 08/03/2020    9:11 AM  PFT Results  FVC-Pre L 2.03   FVC-Predicted Pre % 67   FVC-Post L 2.15   FVC-Predicted Post % 71   Pre FEV1/FVC % % 67   Post FEV1/FCV % % 57   FEV1-Pre L 1.37   FEV1-Predicted Pre % 58   FEV1-Post L 1.22   DLCO uncorrected ml/min/mmHg 9.46   DLCO UNC% % 41   DLCO corrected ml/min/mmHg 9.26   DLCO COR %Predicted % 40   DLVA Predicted % 57   TLC L 5.13   TLC % Predicted % 90   RV % Predicted % 133     FeNO:   Pathology:   Echocardiogram:   Heart Catheterization:     Assessment & Plan:     ICD-10-CM   1. Stage 2 moderate COPD by GOLD classification ([place])  J44.9     2. Former smoker  Z87.891     3. Adenocarcinoma, lung, left (HCC)  C34.92     4. Centrilobular emphysema ([place])  J43.2        Discussion:  This is a 66 year old female, severe COPD, centrilobular emphysema, left lower lobe adenocarcinoma status post radiation therapy, right lower lobe multicystic large area that is been relatively stable with continued surveillance imaging.  She has stage II COPD currently on triple therapy inhaler regimen  Plan: CT follow-up imaging per radiation oncology Pain control better, per primary care COPD management with Breztri inhaler. Continue Breztri inhaler, okay for refills if needed. Continue albuterol as needed Follow-up with [place] in 1  year or as needed for COPD management.    Current Outpatient Medications:    albuterol (VENTOLIN HFA) 108 (90 Base) MCG/ACT inhaler, USE 2 INHALATIONS BY MOUTH  INTO THE LUNGS EVERY 6  HOURS AS NEEDED FOR  SHORTNESS OF BREATH OR  WHEEZING, Disp: 26.8 g, Rfl: 2   Budeson-Glycopyrrol-Formoterol (BREZTRI AEROSPHERE) 160-9-4.8 MCG/ACT AERO, Inhale 2 puffs into the lungs in the morning and at bedtime., Disp: 10.7 g, Rfl: 5   fluticasone (FLONASE) 50 MCG/ACT nasal spray, SHAKE LIQUID AND USE 2 SPRAYS IN EACH NOSTRIL DAILY, Disp: 16 g, Rfl: 6   atorvastatin (LIPITOR) 40 MG tablet, Take 1 tablet (40 mg total) by mouth at bedtime., Disp: 90 tablet, Rfl: 3   baclofen (LIORESAL) 10 MG tablet, Take 0.5 tablets (5 mg total) by mouth 3 (three) times daily. (Patient taking differently: Take 5 mg by mouth 3 (three) times daily as needed for muscle spasms.), Disp: 30 each, Rfl: 0   diclofenac Sodium (VOLTAREN) 1 % GEL, Apply 2 g topically 4 (four) times daily. Apply to painful area of ribs. Need to use regularly for relief., Disp: 100 g, Rfl: 2   DULoxetine (CYMBALTA) 30 MG capsule, Take 1 capsule (30 mg total) by mouth daily., Disp: 30 capsule, Rfl: 3   fentaNYL (DURAGESIC) 25 MCG/HR, Place 1 patch onto the skin every 3 (three) days., Disp: 10 patch, Rfl: 0   gabapentin (NEURONTIN) 300 MG capsule, Take 2 capsules (600 mg total) by mouth 2 (two) times daily AND 3 capsules (900 mg total) at bedtime., Disp: 60 capsule, Rfl: 1   HYDROmorphone (DILAUDID) 2 MG tablet, Take 1 tablet (2 mg total) by mouth in the morning, at noon, and at bedtime., Disp: 42 tablet, Rfl: 0   hydrOXYzine (ATARAX) 50 MG  tablet, Take 1 tablet (50 mg total) by mouth at bedtime as needed for anxiety or itching (sleep)., Disp: 90 tablet, Rfl: 2   ibuprofen (ADVIL) 800 MG tablet, Take 1 tablet (800 mg total) by mouth every 8 (eight) hours as needed., Disp: 30 tablet, Rfl: 0   MELATONIN GUMMIES PO, Take 10 mg by mouth at bedtime., Disp: , Rfl:     naloxone (NARCAN) nasal spray 4 mg/0.1 mL, Use for suspected opioid overdose - too sleepy to wake up, difficulty breathing, Disp: 2 each, Rfl: 4   ondansetron (ZOFRAN) 4 MG tablet, Take 1 tablet (4 mg total) by mouth every 8 (eight) hours as needed for nausea or vomiting., Disp: 30 tablet, Rfl: 1   pantoprazole (PROTONIX) 40 MG tablet, Take 1 tablet (40 mg total) by mouth daily as needed (acid indigestion/heartburn.)., Disp: 30 tablet, Rfl: 2    [name], DO [facility] 05/25/2022 1:56 PM

## 2022-05-25 NOTE — Progress Notes (Signed)
Radiation Oncology         [phone]) [phone] ________________________________  Name: [name] MRN: [id number]  Date: 05/26/2022  DOB: [birth year]  Follow-Up Visit Note  CC: [name], MD  [name], DO    ICD-10-CM   1. Primary adenocarcinoma of lower lobe of left lung (HCC)  C34.32 CT CHEST WO CONTRAST    2. Lesion of left lung  R91.1       Diagnosis:  The encounter diagnosis was Primary adenocarcinoma of lower lobe of left lung ([place]).   Stage IIA (T2b, N0, M0) non-small cell lung cancer, adenocarcinoma of the left lower lobe with suspicious right lower lobe cystic lesion  Interval Since Last Radiation: 1 year, 7 months, and 10 days   Radiation treatment dates:   1/25, 1/27, 1/31, 2/2, 10/16/20   Site/dose:        Beams/energy:   SBRT, VMAT / 6X-FFF  Narrative:  The patient returns today for routine follow-up and to review recent imaging, she was last seen here for follow up on 12/30/21.  Since her last visit, the patient followed up with [name] on 01/19/22. During which time, the patient was noted to be doing well from a respiratory standpoint and denied any complaints.     On 02/09/22, the patient presented to the [facility] on 02/09/22 for evaluation of left chest wall pain and rib pain. Patient detailed the pain as similar to the post-RT pain she had in the past. She also noted concern that the surgical wound on her left back was healing poorly.  CXR performed showed no signs of pulmonary edema, focal pulmonary consolidation, or rib fracture. Small linear densities in the left mid and both lower lung fields were also appreciated suggestive of scarring.  At discharge, the patient was instructed to follow up with pulmonology for further evaluation of her symptoms.   Chest CT on 02/22/22 showed stable appearance of focal pleural-based consolidation in the left lower lobe, likely related to therapy. However, a gradual increase in size of the left  upper lobe nodule was appreciated, raising potential concern for low-grade adenocarcinoma. Similarly, a slight increase in size of a lesion in the right lower lobe was also appreciated, concerning for potential low-grade adenocarcinoma.     Accordingly, the patient followed up with [name] on 03/07/22. During which time, the patient requested to see radiation oncology to discuss the lesion's further. [name] also reassured the patient that the lesion of concern appeared very similar to imaging performed for the past year. In regards to her rib pain, [name] states that this was likely attributed to past radiation therapy to the LLL.  The patient was treated for acute COPD exacerbation by her PCP on 03/21/22. She was given decadron and z pack. She also came down with pneumonia in early August and was treated with Augmentin per pulmonology with clinical improvement. Per her most recent follow-up with pulmonology on 04/20/22, the patient continued to report persistent rib pain. She also follows with pain management and her PCP for this issue.   (Recent follow-up chest x-rays for evaluation of her persistent rib pain have shown no acute or suspicious abnormalities).             Her most recent chest CT on 05/25/22 showed showed the treated lesion in the superior segment of the left lobe being stable.  Other areas within the chest also stable.  Of note was the patient had developed mildly  displaced fractures of the left sixth and seventh ribs posteriorly.  This was in close proximity to the treated lesion and was likely felt to be insufficiency fractures and not pathologic fractures..   For the patient's pain she has been placed on a fentanyl patch which seems to be helping quite a bit and the patient is pleased with her pain control at this time.  She is able to sleep well at night.                Allergies:  is allergic to codeine and prednisone.  Meds: Current Outpatient Medications  Medication  Sig Dispense Refill   albuterol (VENTOLIN HFA) 108 (90 Base) MCG/ACT inhaler USE 2 INHALATIONS BY MOUTH  INTO THE LUNGS EVERY 6  HOURS AS NEEDED FOR  SHORTNESS OF BREATH OR  WHEEZING 26.8 g 2   atorvastatin (LIPITOR) 40 MG tablet Take 1 tablet (40 mg total) by mouth at bedtime. 90 tablet 3   baclofen (LIORESAL) 10 MG tablet Take 0.5 tablets (5 mg total) by mouth 3 (three) times daily. (Patient taking differently: Take 5 mg by mouth 3 (three) times daily as needed for muscle spasms.) 30 each 0   Budeson-Glycopyrrol-Formoterol (BREZTRI AEROSPHERE) 160-9-4.8 MCG/ACT AERO Inhale 2 puffs into the lungs in the morning and at bedtime. 10.7 g 5   diclofenac Sodium (VOLTAREN) 1 % GEL Apply 2 g topically 4 (four) times daily. Apply to painful area of ribs. Need to use regularly for relief. 100 g 2   DULoxetine (CYMBALTA) 30 MG capsule Take 1 capsule (30 mg total) by mouth daily. 30 capsule 3   fentaNYL (DURAGESIC) 25 MCG/HR Place 1 patch onto the skin every 3 (three) days. 10 patch 0   fluticasone (FLONASE) 50 MCG/ACT nasal spray SHAKE LIQUID AND USE 2 SPRAYS IN EACH NOSTRIL DAILY 16 g 6   gabapentin (NEURONTIN) 300 MG capsule Take 2 capsules (600 mg total) by mouth 2 (two) times daily AND 3 capsules (900 mg total) at bedtime. 60 capsule 1   HYDROmorphone (DILAUDID) 2 MG tablet Take 1 tablet (2 mg total) by mouth in the morning, at noon, and at bedtime. 42 tablet 0   hydrOXYzine (ATARAX) 50 MG tablet Take 1 tablet (50 mg total) by mouth at bedtime as needed for anxiety or itching (sleep). 90 tablet 2   ibuprofen (ADVIL) 800 MG tablet Take 1 tablet (800 mg total) by mouth every 8 (eight) hours as needed. 30 tablet 0   MELATONIN GUMMIES PO Take 10 mg by mouth at bedtime.     naloxone (NARCAN) nasal spray 4 mg/0.1 mL Use for suspected opioid overdose - too sleepy to wake up, difficulty breathing 2 each 4   ondansetron (ZOFRAN) 4 MG tablet Take 1 tablet (4 mg total) by mouth every 8 (eight) hours as needed for  nausea or vomiting. 30 tablet 1   pantoprazole (PROTONIX) 40 MG tablet Take 1 tablet (40 mg total) by mouth daily as needed (acid indigestion/heartburn.). 30 tablet 2   No current facility-administered medications for this encounter.    Physical Findings: The patient is in no acute distress. Patient is alert and oriented.  height is 5\' 8"  (1.727 m) and weight is 191 lb (86.6 kg). Her temperature is 97.7 F (36.5 C). Her blood pressure is 128/87 and her pulse is 73. Her respiration is 20 and oxygen saturation is 94%. .  Lungs are clear to auscultation bilaterally. Heart has regular rate and rhythm. No palpable cervical, supraclavicular, or  axillary adenopathy. Abdomen soft, non-tender, normal bowel sounds.  Examination of the back area reveals no point tenderness.  She does have a scar along the left upper back region from her prior benign fibrolipoma removal.   Lab Findings: Lab Results  Component Value Date   WBC 6.6 08/17/2021   HGB 13.9 08/17/2021   HCT 43.3 08/17/2021   MCV 86.8 08/17/2021   PLT 366 08/17/2021    Radiographic Findings: CT CHEST WO CONTRAST  Result Date: 05/26/2022 CLINICAL DATA:  Non-small cell lung cancer. Assess treatment response. Primary adenocarcinoma of the left lower lobe. * Tracking Code: BO * EXAM: CT CHEST WITHOUT CONTRAST TECHNIQUE: Multidetector CT imaging of the chest was performed following the standard protocol without IV contrast. RADIATION DOSE REDUCTION: This exam was performed according to the departmental dose-optimization program which includes automated exposure control, adjustment of the mA and/or kV according to patient size and/or use of iterative reconstruction technique. COMPARISON:  CT 02/22/2022 and 12/16/2021. Additional prior studies, including PET-CT 08/17/2020. FINDINGS: Cardiovascular: Atherosclerosis of the aorta, great vessels and coronary arteries again noted. There is aberrant right subclavian artery which passes posterior to the  esophagus. The heart size is normal. There is no pericardial effusion. Mediastinum/Nodes: There are no enlarged mediastinal, hilar or axillary lymph nodes.Mild pre-vascular nodularity appears unchanged. Hilar assessment is limited by the lack of intravenous contrast, although the hilar contours appear unchanged. The thyroid gland, trachea and esophagus demonstrate no significant findings. Lungs/Pleura: No pleural effusion or pneumothorax. Moderate centrilobular and paraseptal emphysema. The treated lesion posteriorly in the superior segment of the left lower lobe appears similar with a triangular subpleural density measuring 3.0 x 1.7 cm on image 58/7 (previously 3.6 x 1.7 cm). A 6 x 6 mm solid nodule medially in the left upper lobe on image 73/7 is unchanged from the most recent study and CT 08/09/2021, although has slowly enlarged from older prior studies. There is stable linear scarring at the left lung base. A large predominately cystic right lower lobe lesion measuring up to 8.0 x 5.2 cm on image 100/7 has not significantly changed in overall size. Solid components along the anterior medial aspect of this cyst are slightly smaller than on the most recent prior study, currently measuring 2.0 x 10.7 cm on image 95/7 (previously 2.8 x 1.1 cm when measured in a similar fashion). As noted previously, this lesion has been slowly growing from older prior studies. Mild right lung perifissural nodularity is unchanged. Compared with the most recent study, there are no new or enlarging pulmonary nodules. Upper abdomen: The visualized upper abdomen appears stable without significant findings. There is a stable cyst in the upper pole the right kidney measuring up to 4.5 cm which does not require any specific imaging follow-up. Musculoskeletal/Chest wall: Interval development of mildly displaced fractures of the left 6th and 7th ribs posteriorly near the treated superior segment lesion. The fracture margins appear sharp,  without evidence of underlying lytic lesion. No other fractures or suspicious osseous lesions are identified. Patient is status post lower cervical fusion. IMPRESSION: 1. Compared with the most recent prior study, the lungs appear unchanged. Treated lesion in the superior segment of the left lower lobe is stable. 2. Two lesions of concern based on slow growth from remote priors are unchanged over the interval. These lesions are a solid 6 mm nodule anteromedially in the left upper lobe and a largely cystic mass in the right lower lobe. Continued attention on follow-up recommended. 3. No evidence of metastatic  disease. 4. Interval development of mildly displaced fractures of the left 6th and 7th ribs posteriorly without definite pathologic features. Based on proximity to the treated lesion, these are probably insufficiency fractures. No evidence of osseous metastatic disease. 5. Coronary and aortic atherosclerosis (ICD10-I70.0). Emphysema (ICD10-J43.9). Electronically Signed   By: Richardean Sale M.D.   On: 05/26/2022 09:00    Impression:   The encounter diagnosis was Primary adenocarcinoma of lower lobe of left lung ([place]).   Stage IIA (T2b, N0, M0) non-small cell lung cancer, adenocarcinoma of the left lower lobe with suspicious right lower lobe cystic lesion  Exam today shows no evidence of recurrence.  Recent chest CT scan shows stability of the treated area without recurrence and no other suspicious areas within the chest.  Since her previous exam she has developed insufficiency fractures along the left sixth and seventh ribs which is in close proximity to her previous radiation treatment.  This would explain her ongoing problems with posterior left chest wall pain.  I discussed with the patient that this should get better with time.  As above she is at good control of her pain with fentanyl patch  Plan: Routine follow-up in 6 months.  Prior to this follow-up appointment the patient will undergo a repeat  CT scan of the chest.   25 minutes of total time was spent for this patient encounter, including preparation, face-to-face counseling with the patient and coordination of care, physical exam, and documentation of the encounter. ____________________________________  Blair Promise, PhD, MD  This document serves as a record of services personally performed by Gery Pray, MD. It was created on his behalf by Roney Mans, a trained medical scribe. The creation of this record is based on the scribe's personal observations and the provider's statements to them. This document has been checked and approved by the attending provider.

## 2022-05-26 ENCOUNTER — Encounter: Payer: Self-pay | Admitting: Radiation Oncology

## 2022-05-26 ENCOUNTER — Ambulatory Visit
Admission: RE | Admit: 2022-05-26 | Discharge: 2022-05-26 | Disposition: A | Discharge status: Home / Self Care | Payer: Medicare Other | Source: Ambulatory Visit | Attending: Radiation Oncology | Admitting: Radiation Oncology

## 2022-05-26 VITALS — BP 128/87 | HR 73 | Temp 97.7°F | Resp 20 | Ht 68.0 in | Wt 191.0 lb

## 2022-05-26 DIAGNOSIS — Z923 Personal history of irradiation: Secondary | ICD-10-CM | POA: Insufficient documentation

## 2022-05-26 DIAGNOSIS — Z85118 Personal history of other malignant neoplasm of bronchus and lung: Secondary | ICD-10-CM | POA: Insufficient documentation

## 2022-05-26 DIAGNOSIS — J449 Chronic obstructive pulmonary disease, unspecified: Secondary | ICD-10-CM | POA: Diagnosis not present

## 2022-05-26 DIAGNOSIS — Z79899 Other long term (current) drug therapy: Secondary | ICD-10-CM | POA: Diagnosis not present

## 2022-05-26 DIAGNOSIS — C3432 Malignant neoplasm of lower lobe, left bronchus or lung: Secondary | ICD-10-CM

## 2022-05-26 DIAGNOSIS — Z87891 Personal history of nicotine dependence: Secondary | ICD-10-CM | POA: Diagnosis not present

## 2022-05-26 DIAGNOSIS — R911 Solitary pulmonary nodule: Secondary | ICD-10-CM

## 2022-05-26 NOTE — Progress Notes (Signed)
[name] is here today for follow up post radiation to the lung.  Lung Side: left, patient completed treatment on 10/16/20.  Does the patient complain of any of the following:  Pain: No Shortness of breath w/wo exertion: Yes, mostly on exertion.  Cough: Yes, productive.  Hemoptysis: No Pain with swallowing: No Swallowing/choking concerns: No Appetite: Good Weight:  Wt Readings from Last 3 Encounters:  05/26/22 191 lb (86.6 kg)  05/25/22 190 lb 6.4 oz (86.4 kg)  05/13/22 186 lb 12.8 oz (84.7 kg)    Energy Level: Continues to have mild fatigue.  Post radiation skin Changes: No    Additional comments if applicable:   BP 578/46 (BP Location: Right Arm, Patient Position: Sitting, Cuff Size: Large)   Pulse 73   Temp 97.7 F (36.5 C)   Resp 20   Ht 5\' 8"  (1.727 m)   Wt 191 lb (86.6 kg)   SpO2 94%   BMI 29.04 kg/m

## 2022-05-27 ENCOUNTER — Encounter: Payer: Self-pay | Admitting: Family Medicine

## 2022-05-27 ENCOUNTER — Ambulatory Visit (INDEPENDENT_AMBULATORY_CARE_PROVIDER_SITE_OTHER): Payer: Medicare Other | Admitting: Family Medicine

## 2022-05-27 VITALS — BP 128/62 | HR 99 | Ht 68.0 in | Wt 191.0 lb

## 2022-05-27 DIAGNOSIS — K5903 Drug induced constipation: Secondary | ICD-10-CM

## 2022-05-27 DIAGNOSIS — R1013 Epigastric pain: Secondary | ICD-10-CM | POA: Diagnosis not present

## 2022-05-27 DIAGNOSIS — R0781 Pleurodynia: Secondary | ICD-10-CM | POA: Diagnosis not present

## 2022-05-27 MED ORDER — POLYETHYLENE GLYCOL 3350 17 GM/SCOOP PO POWD: 17.0000 g | Freq: Two times a day (BID) | ORAL | 1 refills | Status: DC | PRN

## 2022-05-27 MED ORDER — SENNA 8.6 MG PO TABS: 1.0000 | ORAL_TABLET | Freq: Every day | ORAL | 0 refills | Status: DC

## 2022-05-27 MED ORDER — PANTOPRAZOLE SODIUM 40 MG PO TBEC: 40.0000 mg | DELAYED_RELEASE_TABLET | Freq: Every day | ORAL | 2 refills | Status: DC | PRN

## 2022-05-27 NOTE — Assessment & Plan Note (Signed)
Patient appears quite pleased with her pain control with the fentanyl patches at this time.  She is able to perform ADLs and IADLs with manageable pain and little to no oversedation.  Given that we now have a definite diagnosis that would explain her rib pain (mildly displaced insufficiency fractures of posterior sixth and seventh ribs), we can plan to use the fentanyl patches while the ribs heal and then wean her off. - Continue fentanyl patch 25 mcg/h (provides 60 MME/day) - Start MiraLAX and senna for opioid related constipation - No new Narcan spray needed at this time, currently has 4 - Continue with IR referral for possible nerve ablation or block (in case her pain is not fully explained by fractures) - Patient to cancel PMR appointment for October given the pain with the fentanyl patch and intention to increase - Return in 2 to 3 months to discuss weaning down of fentanyl patch and seen if her ribs are healed enough to have pain control with ibuprofen or tylenol - Next imaging of chest planned for about 6 months from now with radiation oncology, will follow up report on sixth and seventh ribs healing progress

## 2022-05-27 NOTE — Progress Notes (Signed)
SUBJECTIVE:   CHIEF COMPLAINT / HPI:   Rib pain follow up Ms. Perras is a pleasant 66 year old woman who presents for follow up of left posterior rib pain.  We previously diagnosed this as post radiation rib pain given no improvement after treatment for COPD exacerbation and pneumonia.  Multiple chest x-rays unremarkable.  At last appointment 9/1 with myself, we stopped Dilaudid, continued gabapentin, and started fentanyl patch.  Over brief consultation with her pulmonologist and radiation oncologist, as recommended I refer her to interventional radiology for possible nerve ablation or injection.  Today, patient reports this regimen has worked very well for her and provided pain relief enough to make it manageable for ADLs and IADLs without overly sedating her, nausea, or pruritus as previously experienced with other opioids.  Upon review of her radiation oncologist notes from yesterday, 9/14, it appears that the follow-up CT chest without contrast performed 9/13 reveals 2 rib fractures (mildly displaced of left sixth and seventh ribs posteriorly) thought to be insufficiency fractures as they overlie the area of radiation treatment.  This would certainly explain some of her rib pain.  PERTINENT  PMH / PSH: Primary lung adenocarcinoma of left lower lobe s/p radiation, bladder cancer s/p resection Feb 2022, gold stage III COPD, HLD, costochondritis, osteopenia  OBJECTIVE:   BP 128/62   Pulse 99   Ht 5\' 8"  (1.727 m)   Wt 191 lb (86.6 kg)   SpO2 93%   BMI 29.04 kg/m    PHQ-9:     05/27/2022    1:57 PM 05/13/2022    1:54 PM 05/04/2022   11:17 AM  Depression screen PHQ 2/9  Decreased Interest 0 0 2  Down, Depressed, Hopeless 0 0 0  PHQ - 2 Score 0 0 2  Altered sleeping 2 2 2   Tired, decreased energy 0 2 2  Change in appetite 1 0 2  Feeling bad or failure about yourself  0 0 0  Trouble concentrating 0 0 0  Moving slowly or fidgety/restless 0 0 0  Suicidal thoughts 0 0 0  PHQ-9  Score 3 4 8    Physical Exam General: Awake, alert, oriented, no acute distress Respiratory: Mildly labored respirations at baseline, speaking in full sentences, no respiratory distress Extremities: Moving all extremities spontaneously Psych: Normal insight and judgement   ASSESSMENT/PLAN:   Post radiation rib pain Patient appears quite pleased with her pain control with the fentanyl patches at this time.  She is able to perform ADLs and IADLs with manageable pain and little to no oversedation.  Given that we now have a definite diagnosis that would explain her rib pain (mildly displaced insufficiency fractures of posterior sixth and seventh ribs), we can plan to use the fentanyl patches while the ribs heal and then wean her off. - Continue fentanyl patch 25 mcg/h (provides 60 MME/day) - Start MiraLAX and senna for opioid related constipation - No new Narcan spray needed at this time, currently has 4 - Continue with IR referral for possible nerve ablation or block (in case her pain is not fully explained by fractures) - Patient to cancel PMR appointment for October given the pain with the fentanyl patch and intention to increase - Return in 2 to 3 months to discuss weaning down of fentanyl patch and seen if her ribs are healed enough to have pain control with ibuprofen or tylenol - Next imaging of chest planned for about 6 months from now with radiation oncology, will follow up  report on sixth and seventh ribs healing progress     [name], MD [place]

## 2022-05-27 NOTE — Patient Instructions (Addendum)
Good to see you. I am SO glad your pain is better controlled.   Continue the fentanyl patches as prescribed. You should be able to pick up a one-month supply today at the pharmacy.   Come back in 2-3 months to talk about weaning down and trying a period of time off the patch, to see if you rib fracture has healed enough to not be painful.   Here is the radiologist's impression from your CT scan 9/13 that talks about the rib fractures:  IMPRESSION: 1. Compared with the most recent prior study, the lungs appear unchanged. Treated lesion in the superior segment of the left lower lobe is stable. 2. Two lesions of concern based on slow growth from remote priors are unchanged over the interval. These lesions are a solid 6 mm nodule anteromedially in the left upper lobe and a largely cystic mass in the right lower lobe. Continued attention on follow-up recommended. 3. No evidence of metastatic disease. 4. Interval development of mildly displaced fractures of the left 6th and 7th ribs posteriorly without definite pathologic features. Based on proximity to the treated lesion, these are probably insufficiency fractures. No evidence of osseous metastatic disease. 5. Coronary and aortic atherosclerosis (ICD10-I70.0). Emphysema (ICD10-J43.9).

## 2022-06-16 ENCOUNTER — Other Ambulatory Visit: Payer: Self-pay | Admitting: Family Medicine

## 2022-06-16 DIAGNOSIS — Z1231 Encounter for screening mammogram for malignant neoplasm of breast: Secondary | ICD-10-CM

## 2022-06-17 ENCOUNTER — Other Ambulatory Visit: Payer: Self-pay | Admitting: Family Medicine

## 2022-06-17 DIAGNOSIS — R0781 Pleurodynia: Secondary | ICD-10-CM

## 2022-06-17 MED ORDER — FENTANYL 25 MCG/HR TD PT72: 1.0000 | MEDICATED_PATCH | TRANSDERMAL | 0 refills | Status: DC

## 2022-06-17 NOTE — Progress Notes (Signed)
Needs refill of fentanyl patches.  PDMP reviewed and appropriate. Refill sent for 30 day supply.  [name], MD

## 2022-06-20 ENCOUNTER — Other Ambulatory Visit: Payer: Self-pay

## 2022-06-20 DIAGNOSIS — R0781 Pleurodynia: Secondary | ICD-10-CM

## 2022-06-20 NOTE — Telephone Encounter (Signed)
May need a pre auth. Pt not sure. Ottis Stain, CMA

## 2022-06-21 ENCOUNTER — Encounter
Payer: Medicare Other | Attending: Physical Medicine and Rehabilitation | Admitting: Physical Medicine and Rehabilitation

## 2022-06-23 ENCOUNTER — Encounter: Payer: Self-pay | Admitting: Family Medicine

## 2022-06-23 ENCOUNTER — Ambulatory Visit (INDEPENDENT_AMBULATORY_CARE_PROVIDER_SITE_OTHER): Payer: Medicare Other | Admitting: Family Medicine

## 2022-06-23 VITALS — BP 157/83 | HR 97 | Ht 68.0 in | Wt 180.6 lb

## 2022-06-23 DIAGNOSIS — J441 Chronic obstructive pulmonary disease with (acute) exacerbation: Secondary | ICD-10-CM

## 2022-06-23 DIAGNOSIS — R0781 Pleurodynia: Secondary | ICD-10-CM | POA: Diagnosis not present

## 2022-06-23 MED ORDER — DEXAMETHASONE SODIUM PHOSPHATE 10 MG/ML IJ SOLN
10.0000 mg | Freq: Once | INTRAMUSCULAR | Status: AC
  Administered 2022-06-23: 10 mg via INTRAMUSCULAR

## 2022-06-23 MED ORDER — DEXAMETHASONE SODIUM PHOSPHATE 10 MG/ML IJ SOLN
10.0000 mg | Freq: Once | INTRAMUSCULAR | Status: AC
  Administered 2022-06-27: 10 mg via INTRAMUSCULAR

## 2022-06-23 MED ORDER — FENTANYL 12 MCG/HR TD PT72: 1.0000 | MEDICATED_PATCH | TRANSDERMAL | 0 refills | Status: DC

## 2022-06-23 NOTE — Assessment & Plan Note (Signed)
Improved.  We will start the taper.  DC fentanyl 25 mcg/h patch, start fentanyl patch 12.5 mcg/h.  Patient still has Narcan at home.  Plan for no more than a couple months remaining on pain medication, as her ribs will likely have healed in that time.

## 2022-06-23 NOTE — Patient Instructions (Signed)
It was wonderful to see you today. Thank you for allowing me to be a part of your care. Below is a short summary of what we discussed at your visit today:  Rib pain I have sent in a lower dose of the fentanyl patch.  This is a 12.5 mcg/h patch.  I called her pharmacy to confirm, insurance will pay for it and they have it in stock.  You will be able to pick it up on your way home today.  If you put the patch on the morning of day #1, wear it all the way through day #2 and #3.  Then take off the patch and switch it for new 1 the morning of day #4.  We will wean you off the pain medicine in 1 to 2 months and see how the rib pain is.  Cough, cold, COPD exacerbation Today we gave you a shot of Decadron, which is a steroid.  This is a treatment for the COPD exacerbation.  You will come back for a nurse only visit on Monday 10/16 at 1:30 PM for the second shot of Decadron.  Other vaccines Once you are feeling better, we can talk about getting other vaccines such as flu, Tdap, and pneumonia vaccines.  Please bring all of your medications to every appointment!  If you have any questions or concerns, please do not hesitate to contact us via phone or MyChart message.   [name], MD

## 2022-06-23 NOTE — Progress Notes (Signed)
    SUBJECTIVE:   CHIEF COMPLAINT / HPI:   Post radiation rib pain, rib fracture Patient doing well on the fentanyl 25 mcg/h patch.  She reports it is well managed with this patch she is able to perform ADLs and IADLs with no difficulty.  She believes that she has been changing patches off a little early, as she ran out last week and cannot get a refill from the insurance until 10/16.  At this time, has been without a patch for 5 days.  Also had diarrhea starting yesterday.  Cough, runny nose, sore throat, SOB URI symptoms started Tuesday, 3 days ago.  She reports worsening shortness of breath and persistent cough.  No fever.  Some diarrhea yesterday, but this is much more likely related to the opioid discontinuance.  No nausea, vomiting.  PERTINENT  PMH / PSH: Gold stage III COPD, osteopenia, pulmonary emphysema, lung cancer s/p chemo and radiation, in situ bladder cancer s/p resection  OBJECTIVE:   BP (!) 157/83   Pulse 97   Ht 5\' 8"  (1.727 m)   Wt 180 lb 9.6 oz (81.9 kg)   SpO2 95%   BMI 27.46 kg/m    GEN: Awake, alert, no acute distress Respiratory: Persistent cough and rhinorrhea, lungs clear in all fields, however low voluntary air movement due to rib pain  Cardiac: Regular rate and rhythm, no murmur  ASSESSMENT/PLAN:   COPD exacerbation (HCC) Given viral URI symptoms x3 days along with cough, change in sputum, and shortness of breath concern for COPD exacerbation.  She has Gold stage III COPD.  Given she has received antibiotics for COPD exacerbation to 3 times since June, I would like to avoid antibiotics if possible at this time.  She is afebrile without clinical signs of pneumonia therefore I believe it is reasonable to treat with only steroid.  She has adverse reaction to p.o. prednisone, but is tolerant of IM Decadron.  Will give IM Decadron 10 mg today and then again on Monday.  Post radiation rib pain Improved.  We will start the taper.  DC fentanyl 25 mcg/h patch,  start fentanyl patch 12.5 mcg/h.  Patient still has Narcan at home.  Plan for no more than a couple months remaining on pain medication, as her ribs will likely have healed in that time.  [name], MD [place]

## 2022-06-23 NOTE — Assessment & Plan Note (Signed)
Given viral URI symptoms x3 days along with cough, change in sputum, and shortness of breath concern for COPD exacerbation.  She has Gold stage III COPD.  Given she has received antibiotics for COPD exacerbation to 3 times since June, I would like to avoid antibiotics if possible at this time.  She is afebrile without clinical signs of pneumonia therefore I believe it is reasonable to treat with only steroid.  She has adverse reaction to p.o. prednisone, but is tolerant of IM Decadron.  Will give IM Decadron 10 mg today and then again on Monday.

## 2022-06-27 ENCOUNTER — Ambulatory Visit (INDEPENDENT_AMBULATORY_CARE_PROVIDER_SITE_OTHER): Payer: Medicare Other

## 2022-06-27 DIAGNOSIS — J441 Chronic obstructive pulmonary disease with (acute) exacerbation: Secondary | ICD-10-CM

## 2022-06-27 NOTE — Progress Notes (Signed)
Patient presents to nurse clinic for decadron injection per [name]. Administered in RD, site unremarkable, tolerated injection well.   Patient unable to stay for post-injection monitoring. Patient reports that she has never had an adverse reaction to medication.   Patient is asking when she needs to follow up with [name]. Will forward to PCP.   Talbot Grumbling, RN

## 2022-06-28 NOTE — Progress Notes (Signed)
Spoke to pt. Pt informed of information below. Pt understands.            Will return if no improvement in 1 week or sooner.      [name], CMA      [name], MD  P [facility] Please let patient know she can return in 1-2 months for pain control follow up. Follow up for COPD exacerbation PRN, only if no improvement in 1 week.  [name], MD

## 2022-07-02 ENCOUNTER — Other Ambulatory Visit: Payer: Self-pay | Admitting: Family Medicine

## 2022-07-02 DIAGNOSIS — E785 Hyperlipidemia, unspecified: Secondary | ICD-10-CM

## 2022-07-06 ENCOUNTER — Encounter: Payer: Self-pay | Admitting: Gastroenterology

## 2022-07-06 ENCOUNTER — Encounter: Payer: Medicare Other | Admitting: Pulmonary Disease

## 2022-07-06 ENCOUNTER — Encounter: Payer: Self-pay | Admitting: Pulmonary Disease

## 2022-07-06 NOTE — Progress Notes (Signed)
PCCM:  Patient was recently seen in the office September 2023.  Requested a 1 year follow-up at that time.  She had today's appointment made after an appointment in July of this past year.  This appointment was never canceled therefore she came to this 1 after she got a notification that she should come today for an appointment.  She has no complaints today no issues for follow-up.  Therefore, I explained that we would no charge for today's visit and she is can let us know if anything happens in the future.  We are sorry for the misunderstanding regarding scheduling.  [facility] 07/06/2022 1:41 PM

## 2022-07-14 ENCOUNTER — Ambulatory Visit: Payer: Medicare Other

## 2022-07-25 ENCOUNTER — Other Ambulatory Visit: Payer: Self-pay

## 2022-07-25 DIAGNOSIS — R0781 Pleurodynia: Secondary | ICD-10-CM

## 2022-07-25 MED ORDER — FENTANYL 12 MCG/HR TD PT72: 1.0000 | MEDICATED_PATCH | TRANSDERMAL | 0 refills | Status: DC

## 2022-07-26 DIAGNOSIS — C678 Malignant neoplasm of overlapping sites of bladder: Secondary | ICD-10-CM | POA: Diagnosis not present

## 2022-07-26 DIAGNOSIS — N3000 Acute cystitis without hematuria: Secondary | ICD-10-CM | POA: Diagnosis not present

## 2022-07-28 ENCOUNTER — Telehealth: Payer: Self-pay | Admitting: *Deleted

## 2022-07-28 NOTE — Telephone Encounter (Signed)
CALLED PATIENT TO INFORM OF CT FOR 11-24-22- ARRIVAL TIME- 9:45 AM @ [facility], NO RESTRICTIONS TO TEST, PATIENT TO RECEIVE RESULTS FROM DR. KINARD ON 11-28-22 @ 10 AM, SPOKE WITH PATIENT AND SHE IS AWARE OF THESE APPTS. AND THE INSTRUCTIONS

## 2022-08-19 ENCOUNTER — Encounter: Payer: Self-pay | Admitting: Family Medicine

## 2022-08-19 ENCOUNTER — Ambulatory Visit (INDEPENDENT_AMBULATORY_CARE_PROVIDER_SITE_OTHER): Payer: Medicare Other | Admitting: Family Medicine

## 2022-08-19 ENCOUNTER — Other Ambulatory Visit: Payer: Self-pay

## 2022-08-19 VITALS — BP 135/80 | HR 91 | Wt 180.4 lb

## 2022-08-19 DIAGNOSIS — R0781 Pleurodynia: Secondary | ICD-10-CM

## 2022-08-19 DIAGNOSIS — R093 Abnormal sputum: Secondary | ICD-10-CM

## 2022-08-19 DIAGNOSIS — R0602 Shortness of breath: Secondary | ICD-10-CM

## 2022-08-19 DIAGNOSIS — S2242XA Multiple fractures of ribs, left side, initial encounter for closed fracture: Secondary | ICD-10-CM | POA: Diagnosis not present

## 2022-08-19 MED ORDER — FENTANYL 12 MCG/HR TD PT72: 1.0000 | MEDICATED_PATCH | TRANSDERMAL | 0 refills | Status: DC

## 2022-08-19 MED ORDER — LIDOCAINE 5 % EX PTCH: 1.0000 | MEDICATED_PATCH | CUTANEOUS | 0 refills | Status: DC

## 2022-08-19 MED ORDER — GABAPENTIN 600 MG PO TABS: 600.0000 mg | ORAL_TABLET | Freq: Three times a day (TID) | ORAL | 1 refills | Status: DC

## 2022-08-19 NOTE — Progress Notes (Unsigned)
    SUBJECTIVE:   CHIEF COMPLAINT / HPI:   Anterior rib pain [name] presents with worsening left anterior rib pain. Cannot identify exact onset given numerous recent health issues this summer. Since about May has been dealing with left posterior rib pain, subsequently found on CT in September to be an insufficiency fracture with posterior displacement; due to radiation treatment. This has been successfully treated with low dose fentanyl patch, which allows her to be more functional while the rib was healing.   She believes the anterior rib pain began to be more noticeable once the posterior rib pain was well under control. Tender to touch. No bruising, swelling, or warmth. Pain mainly with palpation. No pain with normal inspiration. Denies tachycardia, tachypnea, and SOB. No known trauma to the area.   PERTINENT  PMH / PSH: Lung cancer of left lobe s/p radiation tx, insufficiency fracture of left posterior 6th and 7th ribs related to radiation, COPD, bladder cancer s/p resection  OBJECTIVE:   BP 135/80   Pulse 91   Wt 180 lb 6.4 oz (81.8 kg)   SpO2 99%   BMI 27.43 kg/m    Gen: awake, alert, NAD MSK: Left anterior ribs (approx 6th-8th) at midclavicular line with exquisite TTP, no overlying warm/redness/swelling, no ecchymoses; spine non-tender to palpation, left posterior 6-7th ribs mildly TTP  ASSESSMENT/PLAN:   Post radiation rib pain Concern for new insufficiency fracture of anterior ribs related to radiation. Will treat as such. Known osteopenia on previous DEXA. Would want imaging to confirm; will pursue rib XR first, but if no clear evidence of fx, may reach out to onc Dr. Sondra Come to ask about moving up surveillance CT chest currently set for March. If definitive fracture, plan for vit. D level, possible supplementation, and discussion of utility for bisphosphonate treatments. Rx fentanyl patch (same dose, no changes), lidocaine patch. Return precautions discussed. Patient and I  on same page that fentanyl patch is for shortest amount of time possible and plan to de-escalate as rib fractures heal.     Laura Essex, MD [place]

## 2022-08-19 NOTE — Patient Instructions (Addendum)
It was wonderful to see you today. Thank you for allowing me to be a part of your care. Below is a short summary of what we discussed at your visit today:  Rib pain I am concerned for a new radiation-related insufficiency fracture of your left front ribs.   Please go to [place] [facility] for ribs x-rays. If they show a fracture, we will do some blood work and start you on bone protecting medications. If it does not show anything, I will ask Dr. Sondra Come to move up your chest CT from March to sometime sooner.  [facility] Address: [place], [place] [address] Hours: Open 8 AM ? Closes 5:30?PM Phone: [phone]  Keep doing the fentanyl patches to stay functional. I have sent a refill.  Apply a lidocaine patch over the painful rib area. One per day, leave on for 24 hours before removing and placing a new one.  I have sent a prescription for 5% lidocaine patches; if insurance does not want to pay for them, you can buy 4% lidocaine patches over the counter.    Please bring all of your medications to every appointment!  If you have any questions or concerns, please do not hesitate to contact us via phone or MyChart message.   [name], MD

## 2022-08-20 DIAGNOSIS — S2242XA Multiple fractures of ribs, left side, initial encounter for closed fracture: Secondary | ICD-10-CM

## 2022-08-20 HISTORY — DX: Multiple fractures of ribs, left side, initial encounter for closed fracture: S22.42XA

## 2022-08-20 NOTE — Assessment & Plan Note (Addendum)
Concern for new insufficiency fracture of anterior ribs related to radiation. Will treat as such. Known osteopenia on previous DEXA. Would want imaging to confirm; will pursue rib XR first, but if no clear evidence of fx, may reach out to onc Dr. Sondra Come to ask about moving up surveillance CT chest currently set for March. If definitive fracture, plan for vit. D level, possible supplementation, and discussion of utility for bisphosphonate treatments. Rx fentanyl patch (same dose, no changes), lidocaine patch. Return precautions discussed. Patient and I on same page that fentanyl patch is for shortest amount of time possible and plan to de-escalate as rib fractures heal.

## 2022-08-23 ENCOUNTER — Other Ambulatory Visit: Payer: Self-pay | Admitting: Family Medicine

## 2022-08-23 ENCOUNTER — Ambulatory Visit
Admission: RE | Admit: 2022-08-23 | Discharge: 2022-08-23 | Disposition: A | Discharge status: Home / Self Care | Payer: Medicare Other | Source: Ambulatory Visit | Attending: Family Medicine | Admitting: Family Medicine

## 2022-08-23 DIAGNOSIS — S2242XA Multiple fractures of ribs, left side, initial encounter for closed fracture: Secondary | ICD-10-CM

## 2022-08-23 DIAGNOSIS — Z85118 Personal history of other malignant neoplasm of bronchus and lung: Secondary | ICD-10-CM | POA: Diagnosis not present

## 2022-08-23 DIAGNOSIS — R093 Abnormal sputum: Secondary | ICD-10-CM

## 2022-08-23 DIAGNOSIS — R0781 Pleurodynia: Secondary | ICD-10-CM

## 2022-08-23 DIAGNOSIS — R0602 Shortness of breath: Secondary | ICD-10-CM

## 2022-08-23 DIAGNOSIS — R918 Other nonspecific abnormal finding of lung field: Secondary | ICD-10-CM | POA: Diagnosis not present

## 2022-08-24 ENCOUNTER — Encounter: Payer: Self-pay | Admitting: Family Medicine

## 2022-08-25 ENCOUNTER — Ambulatory Visit: Payer: Self-pay | Admitting: Radiation Oncology

## 2022-08-30 NOTE — Telephone Encounter (Signed)
Patient is calling and would like for [name] to call her to discuss results from xray. I informed patient that [name] messaged her on mychart. She said she is not able to access her mychart so I read her the message but she still would like to speak with [name].   The best call back is [phone].

## 2022-09-19 ENCOUNTER — Encounter: Payer: Self-pay | Admitting: Student

## 2022-09-19 ENCOUNTER — Ambulatory Visit (INDEPENDENT_AMBULATORY_CARE_PROVIDER_SITE_OTHER): Payer: 59 | Admitting: Student

## 2022-09-19 VITALS — BP 159/88 | HR 65 | Ht 68.0 in | Wt 184.0 lb

## 2022-09-19 DIAGNOSIS — R918 Other nonspecific abnormal finding of lung field: Secondary | ICD-10-CM | POA: Diagnosis not present

## 2022-09-19 DIAGNOSIS — F411 Generalized anxiety disorder: Secondary | ICD-10-CM

## 2022-09-19 DIAGNOSIS — R0781 Pleurodynia: Secondary | ICD-10-CM

## 2022-09-19 DIAGNOSIS — R0782 Intercostal pain: Secondary | ICD-10-CM | POA: Diagnosis not present

## 2022-09-19 DIAGNOSIS — C3432 Malignant neoplasm of lower lobe, left bronchus or lung: Secondary | ICD-10-CM

## 2022-09-19 DIAGNOSIS — G893 Neoplasm related pain (acute) (chronic): Secondary | ICD-10-CM

## 2022-09-19 MED ORDER — FENTANYL 12 MCG/HR TD PT72: 1.0000 | MEDICATED_PATCH | TRANSDERMAL | 0 refills | Status: DC

## 2022-09-19 MED ORDER — HYDROXYZINE HCL 50 MG PO TABS: 50.0000 mg | ORAL_TABLET | Freq: Every evening | ORAL | 2 refills | Status: DC | PRN

## 2022-09-19 NOTE — Assessment & Plan Note (Signed)
Patient with known postradiation her rib pain presents with continued pain on fentanyl patch.  Most recent checks x-ray was negative for any fractures or bone lesions.  Poke with [name] her PCP who expressed she had reached out to the radiation oncologist and waiting on their response. -Refilled fentanyl patch 12 mcg -Ordered chest CT to further evaluate chest pain -Follow-up with PCP in 2 weeks, could consider going up on fentanyl patch dose if appropriate -Reviewed return precaution with patient which she verbalized understanding and amendable to plan.

## 2022-09-19 NOTE — Progress Notes (Signed)
    SUBJECTIVE:   CHIEF COMPLAINT / HPI:   Patient is a 66 year old female presenting today for flank pain. According to patient her pain started within the last year after radiation treatment for adenocarcinoma of the lungs. Was initially treated with fentanyl patch 25 mcg which made her pain manageable at the time But report pain started hurting since her fentanyl patch was reduced  She describes pain as sharp pain in the anterior left chest that sometimes radiate to the mid axilla line.  No fevers or chills Pain is worse with reaching out or positional changes She has tried fentanyl and lidocaine patch. Recently saw Dr. Orene Desanctis who refilled her fentanyl patch and most recent CXR at the time showed no acute fracture or bone lesions.  Plan is for follow up chest CT and recommendation from radiation oncologist.  PERTINENT  PMH / [place]: Reviewed   OBJECTIVE:   BP (!) 159/88   Pulse 65   Ht 5\' 8"  (1.727 m)   Wt 184 lb (83.5 kg)   SpO2 98%   BMI 27.98 kg/m    Physical Exam General: Alert, well appearing, NAD Chest: RRR, No Murmurs, tend on palpation of the anterior left chest to mid axilla line. No notable deformity or skin changes. Respiratory: CTAB, No wheezing or Rales Abdomen: No distension or tenderness Extremities: No edema on extremities   Skin: Warm and dry  ASSESSMENT/PLAN:   Post radiation rib pain Patient with known postradiation her rib pain presents with continued pain on fentanyl patch.  Most recent checks x-ray was negative for any fractures or bone lesions.  Poke with [name] her PCP who expressed she had reached out to the radiation oncologist and waiting on their response. -Refilled fentanyl patch 12 mcg -Ordered chest CT to further evaluate chest pain -Follow-up with PCP in 2 weeks, could consider going up on fentanyl patch dose if appropriate -Reviewed return precaution with patient which she verbalized understanding and amendable to plan.    Elevated  BP Patient's blood pressure today is 159/88.  She denies any issues with hypertension in the past.  Denies any headaches or vision changes.  Elevated BP likely attributed to current chest pain.  Will monitor closely.  Alen Bleacher, MD [place]

## 2022-09-19 NOTE — Patient Instructions (Signed)
It was wonderful to meet you today. Thank you for allowing me to be a part of your care. Below is a short summary of what we discussed at your visit today:  I recommend continue fentanyl patch as prescribed.  We will obtain a chest CT to come to evaluate the left side pain with plan to follow-up with [name] pending results.  Please bring all of your medications to every appointment!  If you have any questions or concerns, please do not hesitate to contact us via phone or MyChart message.   Alen Bleacher, MD [facility]

## 2022-09-27 ENCOUNTER — Ambulatory Visit
Admission: RE | Admit: 2022-09-27 | Discharge: 2022-09-27 | Disposition: A | Discharge status: Home / Self Care | Payer: 59 | Source: Ambulatory Visit | Attending: Family Medicine | Admitting: Family Medicine

## 2022-09-27 DIAGNOSIS — Z1231 Encounter for screening mammogram for malignant neoplasm of breast: Secondary | ICD-10-CM | POA: Diagnosis not present

## 2022-09-28 ENCOUNTER — Encounter: Payer: Self-pay | Admitting: Family Medicine

## 2022-10-03 ENCOUNTER — Other Ambulatory Visit: Payer: Self-pay

## 2022-10-03 ENCOUNTER — Encounter: Payer: Self-pay | Admitting: Family Medicine

## 2022-10-03 ENCOUNTER — Ambulatory Visit (INDEPENDENT_AMBULATORY_CARE_PROVIDER_SITE_OTHER): Payer: 59 | Admitting: Family Medicine

## 2022-10-03 VITALS — BP 138/82 | HR 58 | Wt 178.0 lb

## 2022-10-03 DIAGNOSIS — R7989 Other specified abnormal findings of blood chemistry: Secondary | ICD-10-CM | POA: Diagnosis not present

## 2022-10-03 DIAGNOSIS — R0781 Pleurodynia: Secondary | ICD-10-CM | POA: Diagnosis not present

## 2022-10-03 DIAGNOSIS — I4891 Unspecified atrial fibrillation: Secondary | ICD-10-CM | POA: Diagnosis not present

## 2022-10-03 DIAGNOSIS — Z23 Encounter for immunization: Secondary | ICD-10-CM

## 2022-10-03 DIAGNOSIS — J449 Chronic obstructive pulmonary disease, unspecified: Secondary | ICD-10-CM

## 2022-10-03 DIAGNOSIS — M8440XA Pathological fracture, unspecified site, initial encounter for fracture: Secondary | ICD-10-CM | POA: Diagnosis not present

## 2022-10-03 DIAGNOSIS — E785 Hyperlipidemia, unspecified: Secondary | ICD-10-CM | POA: Diagnosis not present

## 2022-10-03 DIAGNOSIS — Z1211 Encounter for screening for malignant neoplasm of colon: Secondary | ICD-10-CM | POA: Diagnosis not present

## 2022-10-03 DIAGNOSIS — Z0000 Encounter for general adult medical examination without abnormal findings: Secondary | ICD-10-CM | POA: Diagnosis not present

## 2022-10-03 DIAGNOSIS — C3432 Malignant neoplasm of lower lobe, left bronchus or lung: Secondary | ICD-10-CM | POA: Diagnosis not present

## 2022-10-03 DIAGNOSIS — M8580 Other specified disorders of bone density and structure, unspecified site: Secondary | ICD-10-CM

## 2022-10-03 DIAGNOSIS — R7303 Prediabetes: Secondary | ICD-10-CM | POA: Diagnosis not present

## 2022-10-03 DIAGNOSIS — I10 Essential (primary) hypertension: Secondary | ICD-10-CM | POA: Diagnosis not present

## 2022-10-03 MED ORDER — FENTANYL 25 MCG/HR TD PT72: 1.0000 | MEDICATED_PATCH | TRANSDERMAL | 0 refills | Status: AC

## 2022-10-03 MED ORDER — VITAMIN D 50 MCG (2000 UT) PO TABS: 2000.0000 [IU] | ORAL_TABLET | Freq: Every day | ORAL | 3 refills | Status: DC

## 2022-10-03 MED ORDER — OYSTER SHELL CALCIUM/D3 500-5 MG-MCG PO TABS: 1.0000 | ORAL_TABLET | Freq: Every day | ORAL | 3 refills | Status: DC

## 2022-10-03 MED ORDER — BREZTRI AEROSPHERE 160-9-4.8 MCG/ACT IN AERO: 2.0000 | INHALATION_SPRAY | Freq: Two times a day (BID) | RESPIRATORY_TRACT | 13 refills | Status: DC

## 2022-10-03 MED ORDER — ALBUTEROL SULFATE HFA 108 (90 BASE) MCG/ACT IN AERS: INHALATION_SPRAY | RESPIRATORY_TRACT | 6 refills | Status: DC

## 2022-10-03 NOTE — Progress Notes (Signed)
SUBJECTIVE:   CHIEF COMPLAINT / HPI:   Rib pain follow up Ms. Deltoro is here for follow-up of rib pain.  About a month ago, I diagnosed her with a second radiation-related insufficiency fracture, this time of her anterior rib.  We have been treating with fentanyl 12 mcg/h patch.  She saw Dr. Elliot Gurney a couple weeks ago, was continued on the fentanyl 12 mcg/h patch and as ordered a chest CT.  At this time, this chest CT has not yet been scheduled.  Today, she reports that pain is not quite bearable with the 12 mcg/h patch and that she is still functionally limited by the pain.  She notices most when she lifts or extends her arm, especially if she is trying to lift even a small weight such as her bag.  She wonders if she needs to go back to the 25 mcg/h patch until her rib heals.  PERTINENT  PMH / PSH:  Patient Active Problem List   Diagnosis Date Noted   Insufficiency fracture 10/03/2022   Need for immunization against influenza 10/03/2022   Healthcare maintenance 10/03/2022   Multiple closed fractures of ribs of left side 08/20/2022   CAP (community acquired pneumonia) 04/20/2022   COPD exacerbation (HCC) 03/21/2022   Cancer related pain 03/11/2022   Stage 3 severe COPD by GOLD classification (HCC) 03/03/2022   Post radiation rib pain 02/28/2022   Mass of lower lobe of right lung 07/21/2021   Chronic left shoulder pain 04/29/2021   Osteopenia after menopause 01/13/2021   Pulmonary emphysema (HCC) 10/07/2020   Primary adenocarcinoma of lower lobe of left lung (HCC) 08/18/2020   Bladder cancer (HCC) 08/18/2020   Lesion of left lung 07/28/2020   Lesion of right lung 07/28/2020   Nocturnal enuresis 06/23/2020   Chronic hand pain, right 03/26/2019   Chronic bilateral low back pain without sciatica 02/08/2018   HLD, secondary prevention, LDL goal <100 12/14/2017   Oral herpes 10/19/2015   Colon cancer screening 06/08/2015   Arthritis of spine 03/27/2015   Lumbar spine pain  01/09/2015   Hot flashes, menopausal 02/28/2012   Anxiety state 02/28/2012   Restless legs 02/28/2012    OBJECTIVE:   BP 138/82   Pulse (!) 58   Wt 178 lb (80.7 kg)   SpO2 98%   BMI 27.06 kg/m    PHQ-9:     10/03/2022   12:12 PM 05/27/2022    1:57 PM 05/13/2022    1:54 PM  Depression screen PHQ 2/9  Decreased Interest 0 0 0  Down, Depressed, Hopeless 0 0 0  PHQ - 2 Score 0 0 0  Altered sleeping 1 2 2   Tired, decreased energy 1 0 2  Change in appetite  1 0  Feeling bad or failure about yourself  0 0 0  Trouble concentrating 0 0 0  Moving slowly or fidgety/restless 0 0 0  Suicidal thoughts 0 0 0  PHQ-9 Score 2 3 4     Physical Exam General: Awake, alert, oriented Cardiovascular: Regular rate and rhythm, S1 and S2 present, no murmurs auscultated Respiratory: Lung fields clear to auscultation bilaterally MSK: Left ribs TTP over 6 through eighth ribs at mid axillary line and midclavicular line, no overlying ecchymosis or other skin changes Neuro: Cranial nerves II through X grossly intact, able to move all extremities spontaneously   ASSESSMENT/PLAN:   Post radiation rib pain Continue treatment for presumed insufficiency fracture of left anterior ribs just under breast.  Given that the  pain impairs her function, will increase back to the fentanyl 25 mcg/h patch.  Plan to decrease as soon as possible while keeping her functionality.  Believe we are waiting on the insurance approval of CT chest prior to scheduling.  We will contact her with any details.  Given previously diagnosed osteopenia on DEXA, with this new fragility fracture I will start osteoporosis treatment.  Cannot use bisphosphonate due to recent malignancy, but will supplement vitamin D and calcium.  Labs today.  Need for immunization against influenza Tolerated influenza and pneumococcal vaccines well today.  No adverse side effects.  Colon cancer screening Patient minimal to colonoscopy instead of stool testing.   Will refer to GI.  Healthcare maintenance - Refer for colonoscopy - Influenza and pneumococcal vaccines today - Labs: CBC, CMP, A1c, LDL direct, vitamin D     Fayette Pho, MD [facility]

## 2022-10-03 NOTE — Assessment & Plan Note (Signed)
Continue treatment for presumed insufficiency fracture of left anterior ribs just under breast.  Given that the pain impairs her function, will increase back to the fentanyl 25 mcg/h patch.  Plan to decrease as soon as possible while keeping her functionality.  Believe we are waiting on the insurance approval of CT chest prior to scheduling.  We will contact her with any details.  Given previously diagnosed osteopenia on DEXA, with this new fragility fracture I will start osteoporosis treatment.  Cannot use bisphosphonate due to recent malignancy, but will supplement vitamin D and calcium.  Labs today.

## 2022-10-03 NOTE — Assessment & Plan Note (Signed)
Patient minimal to colonoscopy instead of stool testing.  Will refer to GI.

## 2022-10-03 NOTE — Patient Instructions (Addendum)
It was wonderful to see you today. Thank you for allowing me to be a part of your care. Below is a short summary of what we discussed at your visit today:  Rib pain Increase your fentanyl patch back to 25 mcg/h.  Please pick this up from the pharmacy.  We talked about before, as soon as we are able to reduce your pain medication use, we will.  We want your rib to heal and you need to be able to function.  I believe we are waiting on your insurance for approval before we schedule the CT scan of your chest.  We will call you as soon as we have any information or updates.  Insufficiency fractures, bone support Today we will check your vitamin D level.  I want you to start taking a vitamin D and calcium supplement.  I have sent a prescription to your pharmacy.  We will recheck your vitamin D after 12 weeks of this therapy.  Vaccines Today you received the annual flu vaccine and pneumonia vaccine. You may experience some residual soreness at the injection site.  Gentle stretches and regular use of that arm will help speed up your recovery.  As the vaccines are giving your immune system a "practice run" against specific infections, you may feel a little under the weather for the next several days.  We recommend rest as needed and hydrating.  You may come back anytime for your COVID booster.  Medicare annual wellness visit Your insurance likes [place] to perform an annual wellness visit each year.  It looks like you are due for 1 of these.  I will have someone from our front desk reach out to you to get this scheduled.  It may be in person or virtual, what ever you prefer.  These appointments usually take 30 to 45 minutes.  Colon cancer screening I have referred you to GI (gastroenterology) for discussion about colonoscopy.  Someone from their office should be calling you in 1 to 2 weeks to schedule an appointment.  If you do not hear from them, let us know. We may need to nudge along the referral.      Please bring all of your medications to every appointment!  If you have any questions or concerns, please do not hesitate to contact us via phone or MyChart message.   Fayette Pho, MD

## 2022-10-03 NOTE — Assessment & Plan Note (Signed)
Tolerated influenza and pneumococcal vaccines well today.  No adverse side effects.

## 2022-10-03 NOTE — Assessment & Plan Note (Signed)
-  Refer for colonoscopy - Influenza and pneumococcal vaccines today - Labs: CBC, CMP, A1c, LDL direct, vitamin D

## 2022-10-04 ENCOUNTER — Telehealth: Payer: Self-pay

## 2022-10-04 LAB — COMPREHENSIVE METABOLIC PANEL
ALT: 17 IU/L (ref 0–32)
AST: 16 IU/L (ref 0–40)
Albumin/Globulin Ratio: 1.6 (ref 1.2–2.2)
Albumin: 4.7 g/dL (ref 3.9–4.9)
Alkaline Phosphatase: 98 IU/L (ref 44–121)
BUN/Creatinine Ratio: 14 (ref 12–28)
BUN: 14 mg/dL (ref 8–27)
Bilirubin Total: 1.1 mg/dL (ref 0.0–1.2)
CO2: 24 mmol/L (ref 20–29)
Calcium: 9.7 mg/dL (ref 8.7–10.3)
Chloride: 104 mmol/L (ref 96–106)
Creatinine, Ser: 1.02 mg/dL — ABNORMAL HIGH (ref 0.57–1.00)
Globulin, Total: 2.9 g/dL (ref 1.5–4.5)
Glucose: 80 mg/dL (ref 70–99)
Potassium: 4.3 mmol/L (ref 3.5–5.2)
Sodium: 143 mmol/L (ref 134–144)
Total Protein: 7.6 g/dL (ref 6.0–8.5)
eGFR: 60 mL/min/{1.73_m2} (ref >59)

## 2022-10-04 LAB — CBC
Hematocrit: 43.3 % (ref 34.0–46.6)
Hemoglobin: 13.8 g/dL (ref 11.1–15.9)
MCH: 27.1 pg (ref 26.6–33.0)
MCHC: 31.9 g/dL (ref 31.5–35.7)
MCV: 85 fL (ref 79–97)
Platelets: 413 10*3/uL (ref 150–450)
RBC: 5.1 x10E6/uL (ref 3.77–5.28)
RDW: 13.5 % (ref 11.7–15.4)
WBC: 8.2 10*3/uL (ref 3.4–10.8)

## 2022-10-04 LAB — HEMOGLOBIN A1C
Est. average glucose Bld gHb Est-mCnc: 126 mg/dL
Hgb A1c MFr Bld: 6 % — ABNORMAL HIGH (ref 4.8–5.6)

## 2022-10-04 LAB — LDL CHOLESTEROL, DIRECT: LDL Direct: 75 mg/dL (ref 0–99)

## 2022-10-04 LAB — VITAMIN D 25 HYDROXY (VIT D DEFICIENCY, FRACTURES): Vit D, 25-Hydroxy: 16.3 ng/mL — ABNORMAL LOW (ref 30.0–100.0)

## 2022-10-04 NOTE — Telephone Encounter (Signed)
Patient calls nurse line requesting a letter to excuse her from jury duty.   She reports she is set to serve for 10/17/2022.  Will forward to PCP for advisement.

## 2022-10-04 NOTE — Telephone Encounter (Signed)
Patient is also requesting lab results from yesterdays visit.

## 2022-10-06 NOTE — Telephone Encounter (Signed)
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 Will be available in [place] and I have asked our front desk staff to mail a copy to her.   I have provided details on her lab work via [employer], as everything looked to be at baseline. Low vitamin D was to be expected, as we had already planned to start vitamin D supplementation based on history and physical.   [name], MD

## 2022-10-07 ENCOUNTER — Telehealth: Payer: Self-pay | Admitting: Family Medicine

## 2022-10-07 ENCOUNTER — Ambulatory Visit: Payer: Medicaid Other | Admitting: Family Medicine

## 2022-10-07 NOTE — Telephone Encounter (Signed)
Patient stopped by to give me her readings for blood pressure they are 168 over 92 was done on Tuesday January 23rd and other reading is 144 over 85 done on Wednesday 01/24. Next one is 01/25 Wednesday 182 over 86. Last one is todayFriday 10/07/22. 140 over 82.  Any questions please call patient.

## 2022-10-08 NOTE — Telephone Encounter (Signed)
Patient also reports to [name] issues with picking up new fentanyl patch prescription.   Due to issues with pharmacy system, unable to determine issue on 1/26. Called this AM. Pharmacist was able to fix issue. No further action required from provider.   They will contact patient when prescription is ready for pick up.   Talbot Grumbling, RN

## 2022-10-20 ENCOUNTER — Encounter (HOSPITAL_COMMUNITY): Payer: Self-pay

## 2022-10-20 ENCOUNTER — Emergency Department (HOSPITAL_COMMUNITY)
Admission: EM | Admit: 2022-10-20 | Discharge: 2022-10-20 | Disposition: A | Discharge status: Home / Self Care | Payer: 59 | Attending: Emergency Medicine | Admitting: Emergency Medicine

## 2022-10-20 ENCOUNTER — Other Ambulatory Visit: Payer: Self-pay

## 2022-10-20 ENCOUNTER — Emergency Department (HOSPITAL_COMMUNITY): Payer: 59

## 2022-10-20 ENCOUNTER — Telehealth: Payer: Self-pay

## 2022-10-20 DIAGNOSIS — I10 Essential (primary) hypertension: Secondary | ICD-10-CM | POA: Insufficient documentation

## 2022-10-20 DIAGNOSIS — Z79899 Other long term (current) drug therapy: Secondary | ICD-10-CM | POA: Diagnosis not present

## 2022-10-20 DIAGNOSIS — R42 Dizziness and giddiness: Secondary | ICD-10-CM | POA: Diagnosis present

## 2022-10-20 LAB — CBC
HCT: 41.2 % (ref 36.0–46.0)
Hemoglobin: 12.6 g/dL (ref 12.0–15.0)
MCH: 27.2 pg (ref 26.0–34.0)
MCHC: 30.6 g/dL (ref 30.0–36.0)
MCV: 88.8 fL (ref 80.0–100.0)
Platelets: 350 10*3/uL (ref 150–400)
RBC: 4.64 MIL/uL (ref 3.87–5.11)
RDW: 14.4 % (ref 11.5–15.5)
WBC: 8.6 10*3/uL (ref 4.0–10.5)
nRBC: 0 % (ref 0.0–0.2)

## 2022-10-20 LAB — BASIC METABOLIC PANEL
Anion gap: 10 (ref 5–15)
BUN: 17 mg/dL (ref 8–23)
CO2: 26 mmol/L (ref 22–32)
Calcium: 9.5 mg/dL (ref 8.9–10.3)
Chloride: 105 mmol/L (ref 98–111)
Creatinine, Ser: 1.04 mg/dL — ABNORMAL HIGH (ref 0.44–1.00)
GFR, Estimated: 59 mL/min — ABNORMAL LOW (ref >60)
Glucose, Bld: 97 mg/dL (ref 70–99)
Potassium: 4 mmol/L (ref 3.5–5.1)
Sodium: 141 mmol/L (ref 135–145)

## 2022-10-20 LAB — TROPONIN I (HIGH SENSITIVITY): Troponin I (High Sensitivity): 6 ng/L (ref <18)

## 2022-10-20 MED ORDER — AMLODIPINE BESYLATE 5 MG PO TABS
10.0000 mg | ORAL_TABLET | Freq: Once | ORAL | Status: AC
  Administered 2022-10-20: 10 mg via ORAL
  Filled 2022-10-20: qty 2

## 2022-10-20 MED ORDER — AMLODIPINE BESYLATE 10 MG PO TABS: 10.0000 mg | ORAL_TABLET | Freq: Every day | ORAL | 3 refills | Status: DC

## 2022-10-20 NOTE — ED Provider Notes (Signed)
[facility] Provider Note   CSN: [id number] Arrival date & time: 10/20/22  1730     History  No chief complaint on file.   [name] is a 67 y.o. female.  HPI   This patient is a 67 year old female, history of high cholesterol on Lipitor, she takes hydroxyzine for itching and uses a fentanyl patch because of a history of chronic pain related to cancer.  She presents with a complaint of hypertension, she has been measuring her blood pressure at home after being recommended to do this by her family doctor because some labile blood pressures.  She has noticed some relatively increasing blood pressure measurements with systolics ranging up to 027 at home.  She is asymptomatic except for some mild nausea which occurs occasionally but denies chest pain shortness of breath blurred vision or difficulty walking or with balance.  Home Medications Prior to Admission medications   Medication Sig Start Date End Date Taking? Authorizing Provider  amLODipine (NORVASC) 10 MG tablet Take 1 tablet (10 mg total) by mouth daily. 10/20/22  Yes Noemi Chapel, MD  albuterol (VENTOLIN HFA) 108 (90 Base) MCG/ACT inhaler USE 2 INHALATIONS BY MOUTH  INTO THE LUNGS EVERY 6  HOURS AS NEEDED FOR  SHORTNESS OF BREATH OR  WHEEZING 10/03/22   [name], MD  atorvastatin (LIPITOR) 40 MG tablet TAKE 1 TABLET BY MOUTH AT  BEDTIME 07/04/22   Sharion Settler, DO  baclofen (LIORESAL) 10 MG tablet Take 0.5 tablets (5 mg total) by mouth 3 (three) times daily. Patient taking differently: Take 5 mg by mouth 3 (three) times daily as needed for muscle spasms. 06/08/21   Cresenzo, Angelyn Punt, MD  Budeson-Glycopyrrol-Formoterol (BREZTRI AEROSPHERE) 160-9-4.8 MCG/ACT AERO Inhale 2 puffs into the lungs in the morning and at bedtime. 10/03/22   [name], MD  calcium-vitamin D Darron Doom WITH D) 500-5 MG-MCG tablet Take 1 tablet by mouth daily with breakfast. 10/03/22   [name],  MD  Cholecalciferol (VITAMIN D) 50 MCG (2000 UT) tablet Take 1 tablet (2,000 Units total) by mouth daily. 10/03/22   [name], MD  diclofenac Sodium (VOLTAREN) 1 % GEL Apply 2 g topically 4 (four) times daily. Apply to painful area of ribs. Need to use regularly for relief. 01/19/21   [name], MD  DULoxetine (CYMBALTA) 30 MG capsule Take 1 capsule (30 mg total) by mouth daily. 03/25/22   Eppie Gibson, MD  fentaNYL (DURAGESIC) 25 MCG/HR Place 1 patch onto the skin every 3 (three) days. 10/03/22 11/02/22  [name], MD  fluticasone [facility]) 50 MCG/ACT nasal spray SHAKE LIQUID AND USE 2 SPRAYS IN [facility] NOSTRIL DAILY 05/02/22   [name], MD  gabapentin (NEURONTIN) 600 MG tablet Take 1 tablet (600 mg total) by mouth 3 (three) times daily. 08/19/22   [name], MD  hydrOXYzine (ATARAX) 50 MG tablet Take 1 tablet (50 mg total) by mouth at bedtime as needed for anxiety or itching (sleep). 09/19/22   Alen Bleacher, MD  ibuprofen (ADVIL) 800 MG tablet Take 1 tablet (800 mg total) by mouth every 8 (eight) hours as needed. 10/14/21   Kinsinger, Arta Bruce, MD  lidocaine (LIDODERM) 5 % Place 1 patch onto the skin daily. Remove & Discard patch within 12 hours or as directed by MD 08/19/22   [name], MD  MELATONIN GUMMIES PO Take 10 mg by mouth at bedtime.    [provider]  naloxone [facility]) nasal spray 4 mg/0.1  mL Use for suspected opioid overdose - too sleepy to wake up, difficulty breathing 05/13/22   [name], MD  ondansetron (ZOFRAN) 4 MG tablet Take 1 tablet (4 mg total) by mouth every 8 (eight) hours as needed for nausea or vomiting. 04/13/22   [name], MD  pantoprazole (PROTONIX) 40 MG tablet Take 1 tablet (40 mg total) by mouth daily as needed (acid indigestion/heartburn.). 05/27/22   [name], MD  polyethylene glycol powder [facility]) 17 GM/SCOOP powder Take 17 g by mouth 2 (two) times daily as needed. 05/27/22   [name], MD   senna (SENOKOT) 8.6 MG TABS tablet Take 1 tablet (8.6 mg total) by mouth at bedtime. 05/27/22   [name], MD      Allergies    Codeine and Prednisone    Review of Systems   Review of Systems  Gastrointestinal:  Positive for nausea.  All other systems reviewed and are negative.   Physical Exam Updated Vital Signs BP (!) 180/88   Pulse 65   Temp 98.5 F (36.9 C)   Resp 13   Ht 1.727 m (5\' 8" )   Wt 89.4 kg   SpO2 98%   BMI 29.95 kg/m  Physical Exam Vitals and nursing note reviewed.  Constitutional:      General: She is not in acute distress.    Appearance: She is well-developed.  HENT:     Head: Normocephalic and atraumatic.     Mouth/Throat:     Pharynx: No oropharyngeal exudate.  Eyes:     General: No scleral icterus.       Right eye: No discharge.        Left eye: No discharge.     Conjunctiva/sclera: Conjunctivae normal.     Pupils: Pupils are equal, round, and reactive to light.  Neck:     Thyroid: No thyromegaly.     Vascular: No JVD.  Cardiovascular:     Rate and Rhythm: Normal rate and regular rhythm.     Heart sounds: Normal heart sounds. No murmur heard.    No friction rub. No gallop.  Pulmonary:     Effort: Pulmonary effort is normal. No respiratory distress.     Breath sounds: Normal breath sounds. No wheezing or rales.  Abdominal:     General: Bowel sounds are normal. There is no distension.     Palpations: Abdomen is soft. There is no mass.     Tenderness: There is no abdominal tenderness.  Musculoskeletal:        General: No tenderness. Normal range of motion.     Cervical back: Normal range of motion and neck supple.     Right lower leg: No edema.     Left lower leg: No edema.  Lymphadenopathy:     Cervical: No cervical adenopathy.  Skin:    General: Skin is warm and dry.     Findings: No erythema or rash.  Neurological:     Mental Status: She is alert.     Coordination: Coordination normal.  Psychiatric:        Behavior:  Behavior normal.     ED Results / Procedures / Treatments   Labs (all labs ordered are listed, but only abnormal results are displayed) Labs Reviewed  BASIC METABOLIC PANEL - Abnormal; Notable for the following components:      Result Value   Creatinine, Ser 1.04 (*)    GFR, Estimated 59 (*)    All other components within normal limits  CBC  TROPONIN I (HIGH SENSITIVITY)    EKG EKG Interpretation  Date/Time:  Thursday October 20 2022 17:32:14 EST Ventricular Rate:  69 PR Interval:  140 QRS Duration: 80 QT Interval:  404 QTC Calculation: 432 R Axis:   11 Text Interpretation: Normal sinus rhythm Anterior infarct , age undetermined Abnormal ECG When compared with ECG of 09-Feb-2022 19:46, PREVIOUS ECG IS PRESENT since last tracing no significant change Confirmed by Noemi Chapel [phone]) on 10/20/2022 5:58:35 PM  Radiology DG Chest 2 View  Result Date: 10/20/2022 CLINICAL DATA:  Hypertension, history of non-small cell lung cancer left lower lobe EXAM: CHEST - 2 VIEW COMPARISON:  08/23/2022, 05/25/2022 FINDINGS: Frontal and lateral views of the chest demonstrate an unremarkable cardiac silhouette. Stable bilateral areas of scarring. Increased left perihilar density consistent with known post treatment changes within the superior segment of the left lower lobe. No new airspace disease, effusion, or pneumothorax. No acute bony abnormality. IMPRESSION: 1. Chronic bibasilar scarring. 2. Post therapeutic changes within the left lower lobe consistent with history of treated lung cancer. 3. No acute intrathoracic process. Electronically Signed   By: Randa Ngo M.D.   On: 10/20/2022 18:18    Procedures Procedures    Medications Ordered in ED Medications  amLODipine (NORVASC) tablet 10 mg (has no administration in time range)    ED Course/ Medical Decision Making/ A&P                             Medical Decision Making Amount and/or Complexity of Data Reviewed Labs:  ordered. Radiology: ordered.  Risk Prescription drug management.   This patient presents to the ED for concern of hypertension, measurements reviewed from prehospital, can be quite high differential diagnosis includes asymptomatic hypertension, she is not on any withdrawal from opiates, she denies any stimulant use, she is otherwise well-appearing    Additional history obtained:  Additional history obtained from electronic medical record External records from outside source obtained and reviewed including office visit with postradiation rib pain secondary to lung cancer radiation   Lab Tests:  I Ordered, and personally interpreted labs.  The pertinent results include: CBC metabolic panel   Imaging Studies ordered:  I ordered imaging studies including chest x-ray I independently visualized and interpreted imaging which showed no acute findings I agree with the radiologist interpretation   Medicines ordered and prescription drug management:  I ordered medication including amlodipine for blood pressure Reevaluation of the patient after these medicines showed that the patient improved I have reviewed the patients home medicines and have made adjustments as needed   Problem List / ED Course:  Stable for discharge can follow-up in the outpatient setting   Social Determinants of Health:  Patient agreeable to follow-up           Final Clinical Impression(s) / ED Diagnoses Final diagnoses:  Hypertension, essential    Rx / DC Orders ED Discharge Orders          Ordered    amLODipine (NORVASC) 10 MG tablet  Daily        10/20/22 1905              Noemi Chapel, MD 10/20/22 1906

## 2022-10-20 NOTE — Discharge Instructions (Signed)
Please call your family doctor, you should be seen within the next 2 weeks for blood pressure check.  Please take the amlodipine once a day at the same time every day and measure your blood pressure 2 hours after that.  Keep a record of this and share with your doctor.  Return to the ER for severe or worsening symptoms

## 2022-10-20 NOTE — Telephone Encounter (Signed)
Patient calls nurse line regarding elevated BP. She reports BP of 178/98. She does not have a history of high BP and is not on BP medications.   She is also reporting feeling very lightheaded and nauseous.   She denies chest pain, blurry vision/visual changes, SHOB or headaches.   Advised that patient be evaluated today. We do not have any appointments for the remainder of the day.   Patient will be evaluated in urgent care or the emergency department.   Talbot Grumbling, RN

## 2022-10-20 NOTE — ED Triage Notes (Signed)
Pt came in via POV d/t HTN for the past few days, 214/89 in triage, denies HA or CP, just endorses some dizziness & finger numbness.

## 2022-10-20 NOTE — ED Notes (Signed)
ED Provider at bedside. 

## 2022-10-21 ENCOUNTER — Ambulatory Visit: Payer: 59

## 2022-11-04 ENCOUNTER — Telehealth: Payer: Self-pay

## 2022-11-04 NOTE — Telephone Encounter (Signed)
Patient calls nurse line reporting undesired side effects to Amlodipine.   She reports she went to [facility] for blood pressure and was given this as a new prescription. She reports after the taking the medication she feels nauseous. She denies any vomiting, dizziness, throat swelling, difficulty breathing or hives.   She does report she does not feel as nauseous if she eats before taking the medication.   Patient advised to eat before taking the medication to if she is able to tolerate better.   She has an apt with PCP scheduled for 2/24. Advised to monitor BP at home.   ED precautions given.

## 2022-11-08 ENCOUNTER — Ambulatory Visit (INDEPENDENT_AMBULATORY_CARE_PROVIDER_SITE_OTHER): Payer: 59 | Admitting: Family Medicine

## 2022-11-08 ENCOUNTER — Encounter: Payer: Self-pay | Admitting: Family Medicine

## 2022-11-08 VITALS — BP 118/68 | HR 76 | Ht 68.0 in | Wt 178.0 lb

## 2022-11-08 DIAGNOSIS — S2242XS Multiple fractures of ribs, left side, sequela: Secondary | ICD-10-CM | POA: Diagnosis not present

## 2022-11-08 DIAGNOSIS — Z23 Encounter for immunization: Secondary | ICD-10-CM | POA: Diagnosis not present

## 2022-11-08 DIAGNOSIS — G4700 Insomnia, unspecified: Secondary | ICD-10-CM | POA: Diagnosis not present

## 2022-11-08 DIAGNOSIS — F411 Generalized anxiety disorder: Secondary | ICD-10-CM | POA: Diagnosis not present

## 2022-11-08 DIAGNOSIS — R0781 Pleurodynia: Secondary | ICD-10-CM | POA: Diagnosis not present

## 2022-11-08 DIAGNOSIS — I10 Essential (primary) hypertension: Secondary | ICD-10-CM | POA: Insufficient documentation

## 2022-11-08 MED ORDER — HYDROXYZINE HCL 50 MG PO TABS: 100.0000 mg | ORAL_TABLET | Freq: Every evening | ORAL | 2 refills | Status: DC | PRN

## 2022-11-08 MED ORDER — FENTANYL 25 MCG/HR TD PT72: 1.0000 | MEDICATED_PATCH | TRANSDERMAL | 0 refills | Status: DC

## 2022-11-08 NOTE — Assessment & Plan Note (Signed)
At goal on amlodipine 10 mg daily.  Tolerating well now without nausea (she ate some food prior to taking medication).  No BLE edema.  Will continue without changes.

## 2022-11-08 NOTE — Assessment & Plan Note (Signed)
Improved greatly.  Refill fentanyl patch 25 mcg.  Has upcoming CT chest with rad onc on 3/14.  Will look for the scan and evaluate healing of rib.  Suspect good healing present.  After that, we will taper down.

## 2022-11-08 NOTE — Progress Notes (Signed)
   SUBJECTIVE:   CHIEF COMPLAINT / HPI:   Hypertension Doing well now on amlodipine 10 mg.  No BLE edema.  The pill was initially making her nauseous, however this is no longer happening because she eats food prior to taking the medication.  Home BP variable, however appears to respond well to medication.  All elevated measurements patient said were taken in the morning right after administration of her medication.  Home BP ranges 116/80 to 156/82.  Post radiation rib pain related to insufficiency fractures Rib pain fairly well controlled on the fentanyl 25 mcg patches.  Has a CT scan coming up with her radiation oncologist on 3/14.  Will await visualization of fractures on that scan, plan for decreased pain meds afterwards.  Insomnia Difficulty staying asleep.  Assisted by melatonin although she has to take several.  Does have hydroxyzine for anxiety, wonders if she can use more at nighttime for sleep.  No identifiable causes such as prebedtime alcohol consumption, racing thoughts, or sleep apnea.  PERTINENT  PMH / PSH:  Patient Active Problem List   Diagnosis Date Noted   Primary hypertension 11/08/2022   Insufficiency fracture 10/03/2022   COPD exacerbation ([place]) 03/21/2022   Stage 3 severe COPD by GOLD classification ([place]) 03/03/2022   Post radiation rib pain 02/28/2022   Osteopenia after menopause 01/13/2021   Pulmonary emphysema ([place]) 10/07/2020   Primary adenocarcinoma of lower lobe of left lung ([place]) 08/18/2020   Bladder cancer ([place]) 08/18/2020   Chronic hand pain, right 03/26/2019   Chronic bilateral low back pain without sciatica 02/08/2018   HLD, secondary prevention, LDL goal <100 12/14/2017   Insomnia 10/30/2015   Oral herpes 10/19/2015   Arthritis of spine 03/27/2015   Hot flashes, menopausal 02/28/2012   Restless legs 02/28/2012    OBJECTIVE:   BP 118/68   Pulse 76   Ht 5\' 8"  (1.727 m)   Wt 178 lb (80.7 kg)   SpO2 97%   BMI 27.06 kg/m    General: Awake,  alert, NAD Cardiac: Regular rate and rhythm, no murmur Respiratory: CTAB, no wheezes MSK: Mild tingling TTP of left posterior ribs at 6 through eighth ribs at mid scapular location, mild to moderate TTP of left anterior ribs under breast at fifth through seventh ribs  ASSESSMENT/PLAN:   Post radiation rib pain Improved greatly.  Refill fentanyl patch 25 mcg.  Has upcoming CT chest with rad onc on 3/14.  Will look for the scan and evaluate healing of rib.  Suspect good healing present.  After that, we will taper down.  Insomnia Difficulty staying asleep.  Assisted with melatonin.  Will increase hydroxyzine at bedtime from 50 mg to 100 mg.  Provided information on sleep hygiene and resources.  Primary hypertension At goal on amlodipine 10 mg daily.  Tolerating well now without nausea (she ate some food prior to taking medication).  No BLE edema.  Will continue without changes.  Encounter for immunization Received COVID booster.  Tolerated well without adverse side effects.     [name], MD [place]

## 2022-11-08 NOTE — Assessment & Plan Note (Signed)
Difficulty staying asleep.  Assisted with melatonin.  Will increase hydroxyzine at bedtime from 50 mg to 100 mg.  Provided information on sleep hygiene and resources.

## 2022-11-08 NOTE — Patient Instructions (Signed)
It was wonderful to see you today. Thank you for allowing me to be a part of your care. Below is a short summary of what we discussed at your visit today:  Blood pressure BP is excellent today - no changes to your meds! We can wean off if there are signs of your blood pressure being too low.   Rib pain Refill the patches I'll look for the results of your CT scan on 3/14  Insomnia Increase your hydroxyzine to 100 mg (2 tabs) when you can't fall back asleep.   Please bring all of your medications to every appointment!  If you have any questions or concerns, please do not hesitate to contact us via phone or MyChart message.   [name], MD   Insomnia Today we discussed your difficulty sleeping. I recommend two things to start: melatonin and a regular wake up time.   Melatonin: Take 5 mg of over-the counter melatonin about 30 minutes before bed. I recommend taking it before you move to the bedroom to put on pajamas and brush your teeth.   Regular wake up time: Wake up at the same time every day and do not take day time naps.   See below for more tips and web sites with reliable information for you.   If you have any questions or concerns, please do not hesitate to contact us via phone or MyChart message.   [name], MD   CDC tips for better sleep http://www.davis-wright.info/     American Society of Sleep Medicine  https://sleepeducation.org/healthy-sleep/healthy-sleep-habits/    Healthy Sleep Habits Your behaviors during the day, and especially before bedtime, can have a major impact on your sleep. They can promote healthy sleep or contribute to sleeplessness.  Your daily routines - what you eat and drink, the medications you take, how you schedule your days and how you choose to spend your evenings - can significantly impact your quality of sleep. Even a few slight adjustments can, in some cases, mean the difference between sound  sleep and a restless night.  The term "sleep hygiene" refers to a series of healthy sleep habits that can improve your ability to fall asleep and stay asleep. These habits can help improve your sleep health.  When people struggle with insomnia, sleep hygiene is an important part of cognitive behavioral therapy (CBT), the most effective long-term treatment for people with chronic insomnia. CBT for insomnia can help you address the thoughts and behaviors that prevent you from sleeping well. It also includes techniques for stress reduction, relaxation and sleep schedule management.  If you have difficulty sleeping or want to improve your sleep, try following these healthy sleep habits. Talk to your medical provider if your sleep problem persists. You also can seek help from the sleep team at an AASM accredited sleep center.  Quick sleep tips Keep a consistent sleep schedule. Get up at the same time every day, even on weekends or during vacations. Set a bedtime that is early enough for you to get at least 7-8 hours of sleep. Don't go to bed unless you are sleepy. If you don't fall asleep after 20 minutes, get out of bed. Go do a quiet activity without a lot of light exposure. It is especially important to not get on electronics. Establish a relaxing bedtime routine. Use your bed only for sleep and sex. Make your bedroom quiet and relaxing. Keep the room at a comfortable, cool temperature. Limit exposure to bright light in the evenings. Turn  off electronic devices at least 30 minutes before bedtime. Don't eat a large meal before bedtime. If you are hungry at night, eat a light, healthy snack. Exercise regularly and maintain a healthy diet. Avoid consuming caffeine in the afternoon or evening. Avoid consuming alcohol before bedtime. Reduce your fluid intake before bedtime.

## 2022-11-08 NOTE — Assessment & Plan Note (Signed)
Received COVID booster.  Tolerated well without adverse side effects.

## 2022-11-14 ENCOUNTER — Telehealth: Payer: Self-pay | Admitting: Family Medicine

## 2022-11-14 DIAGNOSIS — S2242XS Multiple fractures of ribs, left side, sequela: Secondary | ICD-10-CM

## 2022-11-14 NOTE — Telephone Encounter (Signed)
Patient walked in and requested that her prescription be sent to [facility] on [address]. Please call once completed [phone]

## 2022-11-15 MED ORDER — FENTANYL 25 MCG/HR TD PT72: 1.0000 | MEDICATED_PATCH | TRANSDERMAL | 0 refills | Status: DC

## 2022-11-15 NOTE — Telephone Encounter (Signed)
Sent in fentanyl patch prescription to [facility] requested.  [name], MD

## 2022-11-18 ENCOUNTER — Encounter: Payer: Self-pay | Admitting: Family Medicine

## 2022-11-18 ENCOUNTER — Ambulatory Visit (INDEPENDENT_AMBULATORY_CARE_PROVIDER_SITE_OTHER): Payer: 59 | Admitting: Family Medicine

## 2022-11-18 VITALS — BP 138/74 | HR 94 | Ht 68.0 in | Wt 174.0 lb

## 2022-11-18 DIAGNOSIS — J449 Chronic obstructive pulmonary disease, unspecified: Secondary | ICD-10-CM

## 2022-11-18 DIAGNOSIS — C3432 Malignant neoplasm of lower lobe, left bronchus or lung: Secondary | ICD-10-CM | POA: Diagnosis not present

## 2022-11-18 DIAGNOSIS — J441 Chronic obstructive pulmonary disease with (acute) exacerbation: Secondary | ICD-10-CM | POA: Diagnosis not present

## 2022-11-18 DIAGNOSIS — J069 Acute upper respiratory infection, unspecified: Secondary | ICD-10-CM

## 2022-11-18 LAB — POC SOFIA 2 FLU + SARS ANTIGEN FIA
Influenza A, POC: NEGATIVE
Influenza B, POC: NEGATIVE
SARS Coronavirus 2 Ag: NEGATIVE

## 2022-11-18 MED ORDER — ALBUTEROL SULFATE (2.5 MG/3ML) 0.083% IN NEBU
2.5000 mg | INHALATION_SOLUTION | Freq: Once | RESPIRATORY_TRACT | Status: AC
  Administered 2022-11-18: 2.5 mg via RESPIRATORY_TRACT

## 2022-11-18 MED ORDER — AZITHROMYCIN 250 MG PO TABS: ORAL_TABLET | ORAL | 0 refills | Status: AC

## 2022-11-18 MED ORDER — DEXAMETHASONE SODIUM PHOSPHATE 10 MG/ML IJ SOLN
10.0000 mg | Freq: Once | INTRAMUSCULAR | Status: AC
  Administered 2022-11-18: 10 mg via INTRAMUSCULAR

## 2022-11-18 MED ORDER — ALBUTEROL SULFATE HFA 108 (90 BASE) MCG/ACT IN AERS: INHALATION_SPRAY | RESPIRATORY_TRACT | 6 refills | Status: DC

## 2022-11-18 MED ORDER — BREZTRI AEROSPHERE 160-9-4.8 MCG/ACT IN AERO: 2.0000 | INHALATION_SPRAY | Freq: Two times a day (BID) | RESPIRATORY_TRACT | 13 refills | Status: DC

## 2022-11-18 MED ORDER — DEXAMETHASONE SODIUM PHOSPHATE 10 MG/ML IJ SOLN
10.0000 mg | Freq: Once | INTRAMUSCULAR | Status: AC
  Administered 2022-11-21: 10 mg via INTRAMUSCULAR

## 2022-11-18 NOTE — Progress Notes (Unsigned)
   SUBJECTIVE:   CHIEF COMPLAINT / HPI:   URI symptoms Ms. [name] is a pleasant 67 yo woman who presents today with one week of URI symptoms. She has been experiencing head congestion, fatigue, loss of taste and smell, insomnia, nausea, and some vomiting (yesterday morning x1, today dry heaves) with small amounts of diarrhea.   She has been eating and drinking somewhat normally. Normal UOP.   PERTINENT  PMH / PSH:  Patient Active Problem List   Diagnosis Date Noted   Primary hypertension 11/08/2022   Insufficiency fracture 10/03/2022   COPD exacerbation ([place]) 03/21/2022   Stage 3 severe COPD by GOLD classification ([place]) 03/03/2022   Post radiation rib pain 02/28/2022   Osteopenia after menopause 01/13/2021   Pulmonary emphysema ([place]) 10/07/2020   Primary adenocarcinoma of lower lobe of left lung ([place]) 08/18/2020   Bladder cancer ([place]) 08/18/2020   Chronic hand pain, right 03/26/2019   Chronic bilateral low back pain without sciatica 02/08/2018   HLD, secondary prevention, LDL goal <100 12/14/2017   Insomnia 10/30/2015   Oral herpes 10/19/2015   Arthritis of spine 03/27/2015   Hot flashes, menopausal 02/28/2012   Restless legs 02/28/2012    OBJECTIVE:   BP 138/74   Pulse 94   Ht 5\' 8"  (6.295 m)   Wt 174 lb (78.9 kg)   SpO2 95%   BMI 26.46 kg/m    Gen: awake, alert, ill appearing HEENT: sclera anicteric and non-injected Cardiac: RRR, no murmur Resp: lungs CTAB without wheezing/rhonchi/rales although limited by low volume inspiration due to cough, brief high-pitched episode of laryngeal spasms relieved by inhaler and neb treatment - during this episode, lung fields CTAB  ASSESSMENT/PLAN:   COPD exacerbation (HCC) Administered one round albuterol neb in clinic after laryngeal spasm episode - good resolution. Suspect viral URI. POC swab negative for flu and COVID. Given severity of COPD and changes in sputum with worsened cough frequency and severity, will treat as COPD  exacerbation. Rx azithromycin x 5 days. As she has been historically intolerant of PO prednisone, will administer I dexamethasone today and again on Monday in nurse clinic to provide steroid coverage for COPD exacerbation. Refilled Breztri and albuterol rescue inhalers to ensure she does not run out. Return precautions given.      [name], MD [place]

## 2022-11-18 NOTE — Patient Instructions (Addendum)
It was wonderful to see you today. Thank you for allowing me to be a part of your care. Below is a short summary of what we discussed at your visit today:  Upper respiratory illness and COPD exacerbation Take azithromycin as prescribed for 5 days. Today we gave you dexamethasone injection since you cannot tolerate oral prednisone.  Return on Monday for nurse visit for your second dose of t steroid the steroid.   Monday 3/11 at 9 AM  N[facility]  Swab results Your swab came back negative for flu and COVID! I would simply treat you as a COPD exacerbation with steroids and antibiotics.  Make sure to focus on hydration.  I want you to drink enough to pee at least 3-4 times in a day.    If you have any questions or concerns, please do not hesitate to contact us via phone or MyChart message.   [name], MD

## 2022-11-20 NOTE — Assessment & Plan Note (Signed)
Administered one round albuterol neb in clinic after laryngeal spasm episode - good resolution. Suspect viral URI. POC swab negative for flu and COVID. Given severity of COPD and changes in sputum with worsened cough frequency and severity, will treat as COPD exacerbation. Rx azithromycin x 5 days. As she has been historically intolerant of PO prednisone, will administer I dexamethasone today and again on Monday in nurse clinic to provide steroid coverage for COPD exacerbation. Refilled Breztri and albuterol rescue inhalers to ensure she does not run out. Return precautions given.

## 2022-11-21 ENCOUNTER — Ambulatory Visit (INDEPENDENT_AMBULATORY_CARE_PROVIDER_SITE_OTHER): Payer: 59

## 2022-11-21 ENCOUNTER — Ambulatory Visit: Payer: Self-pay

## 2022-11-21 DIAGNOSIS — J069 Acute upper respiratory infection, unspecified: Secondary | ICD-10-CM

## 2022-11-21 DIAGNOSIS — J441 Chronic obstructive pulmonary disease with (acute) exacerbation: Secondary | ICD-10-CM

## 2022-11-21 NOTE — Progress Notes (Signed)
Patient presents to nurse clinic for Decadron injection. Patient reports that she has never had an adverse reaction to this medication.  Administered 10 mg Decadron in LD per Dr. Arby Barrette orders. Site unremarkable, tolerated injection well.   Talbot Grumbling, RN

## 2022-11-24 ENCOUNTER — Ambulatory Visit (HOSPITAL_COMMUNITY)
Admission: RE | Admit: 2022-11-24 | Discharge: 2022-11-24 | Disposition: A | Discharge status: Home / Self Care | Payer: 59 | Source: Ambulatory Visit | Attending: Radiation Oncology | Admitting: Radiation Oncology

## 2022-11-24 DIAGNOSIS — C3432 Malignant neoplasm of lower lobe, left bronchus or lung: Secondary | ICD-10-CM | POA: Insufficient documentation

## 2022-11-24 DIAGNOSIS — J439 Emphysema, unspecified: Secondary | ICD-10-CM | POA: Diagnosis not present

## 2022-11-24 DIAGNOSIS — C3490 Malignant neoplasm of unspecified part of unspecified bronchus or lung: Secondary | ICD-10-CM | POA: Diagnosis not present

## 2022-11-24 DIAGNOSIS — R918 Other nonspecific abnormal finding of lung field: Secondary | ICD-10-CM | POA: Diagnosis not present

## 2022-11-25 NOTE — Progress Notes (Signed)
Radiation Oncology         ([phone] ________________________________  Name: [name] MRN: [id number]  Date: 11/28/2022  DOB: 1956/05/19  Follow-Up Visit Note  CC: Laura Essex, MD  [name], DO  No diagnosis found.  Diagnosis: The encounter diagnosis was Primary adenocarcinoma of lower lobe of left lung ([place]).   Stage IIA (T2b, N0, M0) non-small cell lung cancer, adenocarcinoma of the left lower lobe with suspicious right lower lobe cystic lesion  Interval Since Last Radiation: 2 years, 1 month, and 2 weeks   Radiation treatment dates:   1/25, 1/27, 1/31, 2/2, 10/16/20   Site/dose:      Narrative:  The patient returns today for routine follow-up and to review recent imaging. She was last seen here for follow-up on 05/26/22.   In the interval since her last visit, the patient presented to the ED on 10/20/22 due to recent hypertensive episodes. The patient was instructed to measure her BP routinely by her PCP and noted some systolic values up to [id number] at home. ED work-up including a chest x-ray and labs were unremarkable, and she was given amlodipine with improvement.    Her most recent chest CT without contrast on 11/24/22 showed ***.                              Allergies:  is allergic to codeine and prednisone.  Meds: Current Outpatient Medications  Medication Sig Dispense Refill   albuterol (VENTOLIN HFA) 108 (90 Base) MCG/ACT inhaler USE 2 INHALATIONS BY MOUTH  INTO THE LUNGS EVERY 6  HOURS AS NEEDED FOR  SHORTNESS OF BREATH OR  WHEEZING 26.8 g 6   amLODipine (NORVASC) 10 MG tablet Take 1 tablet (10 mg total) by mouth daily. 30 tablet 3   atorvastatin (LIPITOR) 40 MG tablet TAKE 1 TABLET BY MOUTH AT  BEDTIME 100 tablet 2   baclofen (LIORESAL) 10 MG tablet Take 0.5 tablets (5 mg total) by mouth 3 (three) times daily. (Patient taking differently: Take 5 mg by mouth 3 (three) times daily as needed for muscle spasms.) 30 each 0   Budeson-Glycopyrrol-Formoterol  (BREZTRI AEROSPHERE) 160-9-4.8 MCG/ACT AERO Inhale 2 puffs into the lungs in the morning and at bedtime. 21.4 g 13   calcium-vitamin D (OSCAL WITH D) 500-5 MG-MCG tablet Take 1 tablet by mouth daily with breakfast. 30 tablet 3   Cholecalciferol (VITAMIN D) 50 MCG (2000 UT) tablet Take 1 tablet (2,000 Units total) by mouth daily. 90 tablet 3   diclofenac Sodium (VOLTAREN) 1 % GEL Apply 2 g topically 4 (four) times daily. Apply to painful area of ribs. Need to use regularly for relief. 100 g 2   DULoxetine (CYMBALTA) 30 MG capsule Take 1 capsule (30 mg total) by mouth daily. 30 capsule 3   fentaNYL (DURAGESIC) 25 MCG/HR Place 1 patch onto the skin every 3 (three) days. 5 patch 0   fluticasone (FLONASE) 50 MCG/ACT nasal spray SHAKE LIQUID AND USE 2 SPRAYS IN EACH NOSTRIL DAILY 16 g 6   gabapentin (NEURONTIN) 600 MG tablet Take 1 tablet (600 mg total) by mouth 3 (three) times daily. 270 tablet 1   hydrOXYzine (ATARAX) 50 MG tablet Take 2 tablets (100 mg total) by mouth at bedtime as needed for anxiety or itching (sleep). 60 tablet 2   ibuprofen (ADVIL) 800 MG tablet Take 1 tablet (800 mg total) by mouth every 8 (eight) hours as needed. [place]  tablet 0   lidocaine (LIDODERM) 5 % Place 1 patch onto the skin daily. Remove & Discard patch within 12 hours or as directed by MD 30 patch 0   MELATONIN GUMMIES PO Take 10 mg by mouth at bedtime.     naloxone (NARCAN) nasal spray 4 mg/0.1 mL Use for suspected opioid overdose - too sleepy to wake up, difficulty breathing 2 each 4   ondansetron (ZOFRAN) 4 MG tablet Take 1 tablet (4 mg total) by mouth every 8 (eight) hours as needed for nausea or vomiting. 30 tablet 1   pantoprazole (PROTONIX) 40 MG tablet Take 1 tablet (40 mg total) by mouth daily as needed (acid indigestion/heartburn.). 30 tablet 2   polyethylene glycol powder (GLYCOLAX/MIRALAX) 17 GM/SCOOP powder Take 17 g by mouth 2 (two) times daily as needed. 3350 g 1   senna (SENOKOT) 8.6 MG TABS tablet Take 1  tablet (8.6 mg total) by mouth at bedtime. 120 tablet 0   No current facility-administered medications for this encounter.    Physical Findings: The patient is in no acute distress. Patient is alert and oriented.  vitals were not taken for this visit. .  No significant changes. Lungs are clear to auscultation bilaterally. Heart has regular rate and rhythm. No palpable cervical, supraclavicular, or axillary adenopathy. Abdomen soft, non-tender, normal bowel sounds.   Lab Findings: Lab Results  Component Value Date   WBC 8.6 10/20/2022   HGB 12.6 10/20/2022   HCT 41.2 10/20/2022   MCV 88.8 10/20/2022   PLT 350 10/20/2022    Radiographic Findings: No results found.  Impression: The encounter diagnosis was Primary adenocarcinoma of lower lobe of left lung ([place]).   Stage IIA (T2b, N0, M0) non-small cell lung cancer, adenocarcinoma of the left lower lobe with suspicious right lower lobe cystic lesion  The patient is recovering from the effects of radiation.  ***  Plan:  ***   *** minutes of total time was spent for this patient encounter, including preparation, face-to-face counseling with the patient and coordination of care, physical exam, and documentation of the encounter. ____________________________________  Blair Promise, PhD, MD  This document serves as a record of services personally performed by Gery Pray, MD. It was created on his behalf by Roney Mans, a trained medical scribe. The creation of this record is based on the scribe's personal observations and the provider's statements to them. This document has been checked and approved by the attending provider.

## 2022-11-28 ENCOUNTER — Ambulatory Visit
Admission: RE | Admit: 2022-11-28 | Discharge: 2022-11-28 | Disposition: A | Discharge status: Home / Self Care | Payer: 59 | Source: Ambulatory Visit | Attending: Radiation Oncology | Admitting: Radiation Oncology

## 2022-11-28 ENCOUNTER — Telehealth: Payer: Self-pay | Admitting: *Deleted

## 2022-11-28 ENCOUNTER — Encounter: Payer: Self-pay | Admitting: Radiation Oncology

## 2022-11-28 VITALS — BP 114/74 | HR 93 | Temp 96.9°F | Resp 18 | Ht 68.0 in | Wt 182.1 lb

## 2022-11-28 DIAGNOSIS — Z8719 Personal history of other diseases of the digestive system: Secondary | ICD-10-CM | POA: Diagnosis not present

## 2022-11-28 DIAGNOSIS — Z85820 Personal history of malignant melanoma of skin: Secondary | ICD-10-CM | POA: Diagnosis not present

## 2022-11-28 DIAGNOSIS — Z85118 Personal history of other malignant neoplasm of bronchus and lung: Secondary | ICD-10-CM | POA: Insufficient documentation

## 2022-11-28 DIAGNOSIS — Z923 Personal history of irradiation: Secondary | ICD-10-CM | POA: Diagnosis not present

## 2022-11-28 DIAGNOSIS — Z881 Allergy status to other antibiotic agents status: Secondary | ICD-10-CM | POA: Diagnosis not present

## 2022-11-28 DIAGNOSIS — Z8616 Personal history of COVID-19: Secondary | ICD-10-CM | POA: Diagnosis not present

## 2022-11-28 DIAGNOSIS — Z79899 Other long term (current) drug therapy: Secondary | ICD-10-CM | POA: Insufficient documentation

## 2022-11-28 DIAGNOSIS — Z8349 Family history of other endocrine, nutritional and metabolic diseases: Secondary | ICD-10-CM | POA: Diagnosis not present

## 2022-11-28 DIAGNOSIS — Z823 Family history of stroke: Secondary | ICD-10-CM | POA: Diagnosis not present

## 2022-11-28 DIAGNOSIS — C3432 Malignant neoplasm of lower lobe, left bronchus or lung: Secondary | ICD-10-CM

## 2022-11-28 DIAGNOSIS — Z825 Family history of asthma and other chronic lower respiratory diseases: Secondary | ICD-10-CM | POA: Diagnosis not present

## 2022-11-28 DIAGNOSIS — Z9049 Acquired absence of other specified parts of digestive tract: Secondary | ICD-10-CM | POA: Diagnosis not present

## 2022-11-28 DIAGNOSIS — Z87891 Personal history of nicotine dependence: Secondary | ICD-10-CM | POA: Diagnosis not present

## 2022-11-28 DIAGNOSIS — Z9884 Bariatric surgery status: Secondary | ICD-10-CM | POA: Diagnosis not present

## 2022-11-28 DIAGNOSIS — Z888 Allergy status to other drugs, medicaments and biological substances status: Secondary | ICD-10-CM | POA: Diagnosis not present

## 2022-11-28 DIAGNOSIS — Z833 Family history of diabetes mellitus: Secondary | ICD-10-CM | POA: Diagnosis not present

## 2022-11-28 DIAGNOSIS — Z8261 Family history of arthritis: Secondary | ICD-10-CM | POA: Diagnosis not present

## 2022-11-28 DIAGNOSIS — Z8249 Family history of ischemic heart disease and other diseases of the circulatory system: Secondary | ICD-10-CM | POA: Diagnosis not present

## 2022-11-28 NOTE — Progress Notes (Signed)
[name] is here today for follow up post radiation to the lung.  Lung Side: Left,patient completed treatment on 10/16/20.  Does the patient complain of any of the following: Pain:No Shortness of breath w/wo exertion: Yes Cough: Yes, productive. Hemoptysis: No Pain with swallowing: No Swallowing/choking concerns: No Appetite: Good Energy Level: Fair Post radiation skin Changes: No    Additional comments if applicable:  BP [id number] (BP Location: Left Arm, Patient Position: Sitting)   Pulse 93   Temp (!) 96.9 F (36.1 C) (Temporal)   Resp 18   Ht 5\' 8"  (1.727 m)   Wt 182 lb 2 oz (82.6 kg)   SpO2 97%   BMI 27.69 kg/m

## 2022-11-28 NOTE — Telephone Encounter (Signed)
CALLED PATIENT TO INFORM OF PET SCAN FOR 12-16-22 - ARRIVAL TIME- 7:30 AM @ [facility], PATIENT TO HAVE WATER ONLY- 6 HRS. PRIOR TO TEST, PATIENT TO RECEIVE RESULTS FROM DR. KINARD ON 12-19-22 @ 10 AM, SPOKE WITH PATIENT AND SHE IS AWARE OF THESE APPTS. AND THE INSTRUCTIONS

## 2022-11-30 ENCOUNTER — Telehealth: Payer: Self-pay | Admitting: Family Medicine

## 2022-11-30 DIAGNOSIS — F411 Generalized anxiety disorder: Secondary | ICD-10-CM

## 2022-11-30 DIAGNOSIS — S2242XS Multiple fractures of ribs, left side, sequela: Secondary | ICD-10-CM

## 2022-11-30 MED ORDER — FENTANYL 25 MCG/HR TD PT72: 1.0000 | MEDICATED_PATCH | TRANSDERMAL | 0 refills | Status: DC

## 2022-11-30 MED ORDER — HYDROXYZINE HCL 50 MG PO TABS: 100.0000 mg | ORAL_TABLET | Freq: Every evening | ORAL | 2 refills | Status: DC | PRN

## 2022-11-30 NOTE — Telephone Encounter (Signed)
Called to discuss CT scan with patient. Dr. Sondra Come already discussed new spots on RLL that need PET scan - scheduled for 4/05.   I discussed the nonhealing rib fracture of posterior left 6th and 7th ribs. She is still taking the calcium and vitamin D supplements. Wil continue with fentanyl patch for pain relief and await PET scan results prior to discussion of starting bisphosphonate.   [name], MD

## 2022-12-06 ENCOUNTER — Encounter: Payer: Self-pay | Admitting: Family Medicine

## 2022-12-06 ENCOUNTER — Ambulatory Visit (INDEPENDENT_AMBULATORY_CARE_PROVIDER_SITE_OTHER): Payer: 59 | Admitting: Family Medicine

## 2022-12-06 ENCOUNTER — Telehealth: Payer: Self-pay | Admitting: Family Medicine

## 2022-12-06 VITALS — BP 110/64 | HR 74 | Ht 68.0 in | Wt 180.4 lb

## 2022-12-06 DIAGNOSIS — N951 Menopausal and female climacteric states: Secondary | ICD-10-CM

## 2022-12-06 DIAGNOSIS — R232 Flushing: Secondary | ICD-10-CM

## 2022-12-06 DIAGNOSIS — L309 Dermatitis, unspecified: Secondary | ICD-10-CM

## 2022-12-06 MED ORDER — CITALOPRAM HYDROBROMIDE 10 MG PO TABS: 10.0000 mg | ORAL_TABLET | Freq: Every day | ORAL | 1 refills | Status: DC

## 2022-12-06 MED ORDER — TRIAMCINOLONE ACETONIDE 0.1 % EX CREA: 1.0000 | TOPICAL_CREAM | Freq: Two times a day (BID) | CUTANEOUS | 2 refills | Status: DC

## 2022-12-06 NOTE — Telephone Encounter (Signed)
Contacted [name] to schedule their annual wellness visit. Appointment made for 12/13/2022.  Thank you,  [place] Direct dial  [phone]

## 2022-12-06 NOTE — Patient Instructions (Signed)
It was wonderful to see you today. Thank you for allowing me to be a part of your care. Below is a short summary of what we discussed at your visit today:  Skin Triamcinolone cream to pharmacy.  Thick cream on top.   Hot flashes and anxiety Start celexa 10 mg daily.  Follow up in 3-4 weeks. At that time we'll likely increase to 20 mg daily.   Please bring all of your medications to every appointment!  If you have any questions or concerns, please do not hesitate to contact us via phone or MyChart message.   [name], MD

## 2022-12-11 DIAGNOSIS — R232 Flushing: Secondary | ICD-10-CM | POA: Insufficient documentation

## 2022-12-11 NOTE — Assessment & Plan Note (Signed)
Patient amenable to trying Effexor. Will start with 10 mg and increase to 20 mg in 3-4 weeks once we know she is tolerating well. Discussed long term nature of this medication.Will use hydroxyzine PRN for anxiety episodes. Return for follow up in 3-4 weeks.

## 2022-12-11 NOTE — Progress Notes (Signed)
   SUBJECTIVE:   CHIEF COMPLAINT / HPI:   Anxiety, hot flashes Ms. Janusz presents to discuss ongoing anxiety and hot flashes. She reports both are becoming unbearable. She has previously been prescribed hydroxyzine to help with anxiety episodes; she reports these work somewhat to take the edge off, but they do not help with her rising baseline anxiety. This anxiety is also complicated by the most recent lung cancer surveillance CT that showed possible new areas of malignancy.   PERTINENT  PMH / PSH:  Patient Active Problem List   Diagnosis Date Noted   Hot flashes 12/11/2022   Primary hypertension 11/08/2022   Insufficiency fracture 10/03/2022   COPD exacerbation ([place]) 03/21/2022   Stage 3 severe COPD by GOLD classification ([place]) 03/03/2022   Post radiation rib pain 02/28/2022   Osteopenia after menopause 01/13/2021   Pulmonary emphysema ([place]) 10/07/2020   Primary adenocarcinoma of lower lobe of left lung ([place]) 08/18/2020   Bladder cancer ([place]) 08/18/2020   Chronic hand pain, right 03/26/2019   Chronic bilateral low back pain without sciatica 02/08/2018   HLD, secondary prevention, LDL goal <100 12/14/2017   Insomnia 10/30/2015   Oral herpes 10/19/2015   Arthritis of spine 03/27/2015   Hot flashes, menopausal 02/28/2012   Restless legs 02/28/2012    OBJECTIVE:   BP 110/64   Pulse 74   Ht 5\' 8"  (1.727 m)   Wt 180 lb 6.4 oz (81.8 kg)   SpO2 99%   BMI 27.43 kg/m    Gen: awake, alert, intermittently tearful Resp: speaking clearly in full sentences, lungs with good air movement, scant wheezing in all fields Cardiac: RRR, no murmur  ASSESSMENT/PLAN:   Hot flashes, menopausal Patient amenable to trying Effexor. Will start with 10 mg and increase to 20 mg in 3-4 weeks once we know she is tolerating well. Discussed long term nature of this medication.Will use hydroxyzine PRN for anxiety episodes. Return for follow up in 3-4 weeks.     [name], MD [place]

## 2022-12-12 NOTE — Progress Notes (Signed)
[name] is here today for follow up post radiation to the lung.  Lung Side: left lung completed 10/16/20  Does the patient complain of any of the following: Pain:Patient reports pain in her chest that started today. States that it is a sharp pain. States that it is a constant pain. Shortness of breath w/wo exertion: States that she has sob with activity and at rest.  Cough: denies Hemoptysis:  denies  Pain with swallowing: denies  Swallowing/choking concerns: denies  Appetite: states that it ok Energy Level: states that she has moderate fatigue. Post radiation skin Changes: denies any is with her skin    Additional comments if applicable: Vitals:   12/19/22 0944  BP: 130/86  Pulse: 94  Resp: 20  Temp: (!) 97.5 F (36.4 C)  SpO2: 94%  Weight: 79.7 kg  Height: 5\' 8"  (1.727 m)

## 2022-12-13 ENCOUNTER — Ambulatory Visit (INDEPENDENT_AMBULATORY_CARE_PROVIDER_SITE_OTHER): Payer: 59

## 2022-12-13 VITALS — Ht 68.0 in | Wt 180.0 lb

## 2022-12-13 DIAGNOSIS — Z0000 Encounter for general adult medical examination without abnormal findings: Secondary | ICD-10-CM | POA: Diagnosis not present

## 2022-12-13 NOTE — Patient Instructions (Addendum)
[name] , Thank you for taking time to come for your Medicare Wellness Visit. I appreciate your ongoing commitment to your health goals. Please review the following plan we discussed and let me know if I can assist you in the future.   These are the goals we discussed:  Goals       3x a week for 30 mins (pt-stated)      Walking around your home or outside when its nice.       Increase physical activity (pt-stated)      Lose Weight.      Quit Smoking      Materials and counseling given. Patient is a PPD smoker as of 09/26/2019.        This is a list of the screening recommended for you and due dates:  Health Maintenance  Topic Date Due   DTaP/Tdap/Td vaccine (2 - Td or Tdap) 03/30/2022   Colon Cancer Screening  12/13/2023*   COVID-19 Vaccine (6 - 2023-24 season) 01/03/2023   Flu Shot  04/13/2023   Medicare Annual Wellness Visit  12/13/2023   Mammogram  09/27/2024   Pneumonia Vaccine  Completed   DEXA scan (bone density measurement)  Completed   Hepatitis C Screening: USPSTF Recommendation to screen - Ages 18-79 yo.  Completed   Zoster (Shingles) Vaccine  Completed   HPV Vaccine  Aged Out  *Topic was postponed. The date shown is not the original due date.  Opioid Pain Medicine Management Opioids are powerful medicines that are used to treat moderate to severe pain. When used for short periods of time, they can help you to: Sleep better. Do better in physical or occupational therapy. Feel better in the first few days after an injury. Recover from surgery. Opioids should be taken with the supervision of a trained health care provider. They should be taken for the shortest period of time possible. This is because opioids can be addictive, and the longer you take opioids, the greater your risk of addiction. This addiction can also be called opioid use disorder. What are the risks? Using opioid pain medicines for longer than 3 days increases your risk of side effects. Side  effects include: Constipation. Nausea and vomiting. Breathing difficulties (respiratory depression). Drowsiness. Confusion. Opioid use disorder. Itching. Taking opioid pain medicine for a long period of time can affect your ability to do daily tasks. It also puts you at risk for: Motor vehicle crashes. Depression. Suicide. Heart attack. Overdose, which can be life-threatening. What is a pain treatment plan? A pain treatment plan is an agreement between you and your health care provider. Pain is unique to each person, and treatments vary depending on your condition. To manage your pain, you and your health care provider need to work together. To help you do this: Discuss the goals of your treatment, including how much pain you might expect to have and how you will manage the pain. Review the risks and benefits of taking opioid medicines. Remember that a good treatment plan uses more than one approach and minimizes the chance of side effects. Be honest about the amount of medicines you take and about any drug or alcohol use. Get pain medicine prescriptions from only one health care provider. Pain can be managed with many types of alternative treatments. Ask your health care provider to refer you to one or more specialists who can help you manage pain through: Physical or occupational therapy. Counseling (cognitive behavioral therapy). Good nutrition. Biofeedback. Massage. Meditation. Non-opioid medicine.  Following a gentle exercise program. How to use opioid pain medicine Taking medicine Take your pain medicine exactly as told by your health care provider. Take it only when you need it. If your pain gets less severe, you may take less than your prescribed dose if your health care provider approves. If you are not having pain, do nottake pain medicine unless your health care provider tells you to take it. If your pain is severe, do nottry to treat it yourself by taking more pills than  instructed on your prescription. Contact your health care provider for help. Write down the times when you take your pain medicine. It is easy to become confused while on pain medicine. Writing the time can help you avoid overdose. Take other over-the-counter or prescription medicines only as told by your health care provider. Keeping yourself and others safe  While you are taking opioid pain medicine: Do not drive, use machinery, or power tools. Do not sign legal documents. Do not drink alcohol. Do not take sleeping pills. Do not supervise children by yourself. Do not do activities that require climbing or being in high places. Do not go to a lake, river, ocean, spa, or swimming pool. Do not share your pain medicine with anyone. Keep pain medicine in a locked cabinet or in a secure area where pets and children cannot reach it. Stopping your use of opioids If you have been taking opioid medicine for more than a few weeks, you may need to slowly decrease (taper) how much you take until you stop completely. Tapering your use of opioids can decrease your risk of symptoms of withdrawal, such as: Pain and cramping in the abdomen. Nausea. Sweating. Sleepiness. Restlessness. Uncontrollable shaking (tremors). Cravings for the medicine. Do not attempt to taper your use of opioids on your own. Talk with your health care provider about how to do this. Your health care provider may prescribe a step-down schedule based on how much medicine you are taking and how long you have been taking it. Getting rid of leftover pills Do not save any leftover pills. Get rid of leftover pills safely by: Taking the medicine to a prescription take-back program. This is usually offered by the county or law enforcement. Bringing them to a pharmacy that has a drug disposal container. Flushing them down the toilet. Check the label or package insert of your medicine to see whether this is safe to do. Throwing them out in  the trash. Check the label or package insert of your medicine to see whether this is safe to do. If it is safe to throw it out, remove the medicine from the original container, put it into a sealable bag or container, and mix it with used coffee grounds, food scraps, dirt, or cat litter before putting it in the trash. Follow these instructions at home: Activity Do exercises as told by your health care provider. Avoid activities that make your pain worse. Return to your normal activities as told by your health care provider. Ask your health care provider what activities are safe for you. General instructions You may need to take these actions to prevent or treat constipation: Drink enough fluid to keep your urine pale yellow. Take over-the-counter or prescription medicines. Eat foods that are high in fiber, such as beans, whole grains, and fresh fruits and vegetables. Limit foods that are high in fat and processed sugars, such as fried or sweet foods. Keep all follow-up visits. This is important. Where to find support If  you have been taking opioids for a long time, you may benefit from receiving support for quitting from a local support group or counselor. Ask your health care provider for a referral to these resources in your area. Where to find more information Centers for Disease Control and Prevention (CDC): http://www.wolf.info/ [place] Food and Drug Administration (FDA): [place] Get help right away if: You may have taken too much of an opioid (overdosed). Common symptoms of an overdose: Your breathing is slower or more shallow than normal. You have a very slow heartbeat (pulse). You have slurred speech. You have nausea and vomiting. Your pupils become very small. You have other potential symptoms: You are very confused. You faint or feel like you will faint. You have cold, clammy skin. You have blue lips or fingernails. You have thoughts of harming yourself or harming others. These  symptoms may represent a serious problem that is an emergency. Do not wait to see if the symptoms will go away. Get medical help right away. Call your local emergency services (911 in the [place] Do not drive yourself to the hospital.  If you ever feel like you may hurt yourself or others, or have thoughts about taking your own life, get help right away. Go to your nearest emergency department or: Call your local emergency services (911 in the [place] Call the [facility] 574-521-8819 in the [place] Call a suicide crisis helpline, such as the [facility] at 551-560-3097 or 988 in the [place]. This is open 24 hours a day in the [place] Text the Crisis Text Line at 337 516 5580 (in the [place] Summary Opioid medicines can help you manage moderate to severe pain for a short period of time. A pain treatment plan is an agreement between you and your health care provider. Discuss the goals of your treatment, including how much pain you might expect to have and how you will manage the pain. If you think that you or someone else may have taken too much of an opioid, get medical help right away. This information is not intended to replace advice given to you by your health care provider. Make sure you discuss any questions you have with your health care provider. Document Revised: 03/24/2021 Document Reviewed: 12/09/2020 Elsevier Patient Education  [place] directives: Advance directive discussed with you today. Even though you declined this today, please call our office should you change your mind, and we can give you the proper paperwork for you to fill out.   Conditions/risks identified: None  Next appointment: Follow up in one year for your annual wellness visit    Preventive Care 65 Years and Older, Female Preventive care refers to lifestyle choices and visits with your health care provider that can promote health and wellness. What does  preventive care include? A yearly physical exam. This is also called an annual well check. Dental exams once or twice a year. Routine eye exams. Ask your health care provider how often you should have your eyes checked. Personal lifestyle choices, including: Daily care of your teeth and gums. Regular physical activity. Eating a healthy diet. Avoiding tobacco and drug use. Limiting alcohol use. Practicing safe sex. Taking low-dose aspirin every day. Taking vitamin and mineral supplements as recommended by your health care provider. What happens during an annual well check? The services and screenings done by your health care provider during your annual well check will depend on your age, overall health, lifestyle risk factors, and family  history of disease. Counseling  Your health care provider may ask you questions about your: Alcohol use. Tobacco use. Drug use. Emotional well-being. Home and relationship well-being. Sexual activity. Eating habits. History of falls. Memory and ability to understand (cognition). Work and work Statistician. Reproductive health. Screening  You may have the following tests or measurements: Height, weight, and BMI. Blood pressure. Lipid and cholesterol levels. These may be checked every 5 years, or more frequently if you are over 51 years old. Skin check. Lung cancer screening. You may have this screening every year starting at age 48 if you have a 30-pack-year history of smoking and currently smoke or have quit within the past 15 years. Fecal occult blood test (FOBT) of the stool. You may have this test every year starting at age 72. Flexible sigmoidoscopy or colonoscopy. You may have a sigmoidoscopy every 5 years or a colonoscopy every 10 years starting at age 94. Hepatitis C blood test. Hepatitis B blood test. Sexually transmitted disease (STD) testing. Diabetes screening. This is done by checking your blood sugar (glucose) after you have not  eaten for a while (fasting). You may have this done every 1-3 years. Bone density scan. This is done to screen for osteoporosis. You may have this done starting at age 20. Mammogram. This may be done every 1-2 years. Talk to your health care provider about how often you should have regular mammograms. Talk with your health care provider about your test results, treatment options, and if necessary, the need for more tests. Vaccines  Your health care provider may recommend certain vaccines, such as: Influenza vaccine. This is recommended every year. Tetanus, diphtheria, and acellular pertussis (Tdap, Td) vaccine. You may need a Td booster every 10 years. Zoster vaccine. You may need this after age 107. Pneumococcal 13-valent conjugate (PCV13) vaccine. One dose is recommended after age 16. Pneumococcal polysaccharide (PPSV23) vaccine. One dose is recommended after age 20. Talk to your health care provider about which screenings and vaccines you need and how often you need them. This information is not intended to replace advice given to you by your health care provider. Make sure you discuss any questions you have with your health care provider. Document Released: 09/25/2015 Document Revised: 05/18/2016 Document Reviewed: 06/30/2015 Elsevier Interactive Patient Education  2017 [place] Prevention in the Home Falls can cause injuries. They can happen to people of all ages. There are many things you can do to make your home safe and to help prevent falls. What can I do on the outside of my home? Regularly fix the edges of walkways and driveways and fix any cracks. Remove anything that might make you trip as you walk through a door, such as a raised step or threshold. Trim any bushes or trees on the path to your home. Use bright outdoor lighting. Clear any walking paths of anything that might make someone trip, such as rocks or tools. Regularly check to see if handrails are loose or  broken. Make sure that both sides of any steps have handrails. Any raised decks and porches should have guardrails on the edges. Have any leaves, snow, or ice cleared regularly. Use sand or salt on walking paths during winter. Clean up any spills in your garage right away. This includes oil or grease spills. What can I do in the bathroom? Use night lights. Install grab bars by the toilet and in the tub and shower. Do not use towel bars as grab bars. Use non-skid mats  or decals in the tub or shower. If you need to sit down in the shower, use a plastic, non-slip stool. Keep the floor dry. Clean up any water that spills on the floor as soon as it happens. Remove soap buildup in the tub or shower regularly. Attach bath mats securely with double-sided non-slip rug tape. Do not have throw rugs and other things on the floor that can make you trip. What can I do in the bedroom? Use night lights. Make sure that you have a light by your bed that is easy to reach. Do not use any sheets or blankets that are too big for your bed. They should not hang down onto the floor. Have a firm chair that has side arms. You can use this for support while you get dressed. Do not have throw rugs and other things on the floor that can make you trip. What can I do in the kitchen? Clean up any spills right away. Avoid walking on wet floors. Keep items that you use a lot in easy-to-reach places. If you need to reach something above you, use a strong step stool that has a grab bar. Keep electrical cords out of the way. Do not use floor polish or wax that makes floors slippery. If you must use wax, use non-skid floor wax. Do not have throw rugs and other things on the floor that can make you trip. What can I do with my stairs? Do not leave any items on the stairs. Make sure that there are handrails on both sides of the stairs and use them. Fix handrails that are broken or loose. Make sure that handrails are as long as  the stairways. Check any carpeting to make sure that it is firmly attached to the stairs. Fix any carpet that is loose or worn. Avoid having throw rugs at the top or bottom of the stairs. If you do have throw rugs, attach them to the floor with carpet tape. Make sure that you have a light switch at the top of the stairs and the bottom of the stairs. If you do not have them, ask someone to add them for you. What else can I do to help prevent falls? Wear shoes that: Do not have high heels. Have rubber bottoms. Are comfortable and fit you well. Are closed at the toe. Do not wear sandals. If you use a stepladder: Make sure that it is fully opened. Do not climb a closed stepladder. Make sure that both sides of the stepladder are locked into place. Ask someone to hold it for you, if possible. Clearly mark and make sure that you can see: Any grab bars or handrails. First and last steps. Where the edge of each step is. Use tools that help you move around (mobility aids) if they are needed. These include: Canes. Walkers. Scooters. Crutches. Turn on the lights when you go into a dark area. Replace any light bulbs as soon as they burn out. Set up your furniture so you have a clear path. Avoid moving your furniture around. If any of your floors are uneven, fix them. If there are any pets around you, be aware of where they are. Review your medicines with your doctor. Some medicines can make you feel dizzy. This can increase your chance of falling. Ask your doctor what other things that you can do to help prevent falls. This information is not intended to replace advice given to you by your health care provider. Make sure  you discuss any questions you have with your health care provider. Document Released: 06/25/2009 Document Revised: 02/04/2016 Document Reviewed: 10/03/2014 Elsevier Interactive Patient Education  2017 [employer].

## 2022-12-13 NOTE — Progress Notes (Addendum)
Subjective:   [name] is a 67 y.o. female who presents for Medicare Annual (Subsequent) preventive examination.  Review of Systems    Virtual Visit via Telephone Note  I connected with  [name] on 12/13/22 at  3:30 PM EDT by telephone and verified that I am speaking with the correct person using two identifiers.  Location: Patient: Home Provider: Office Persons participating in the virtual visit: patient/Nurse Health Advisor   I discussed the limitations, risks, security and privacy concerns of performing an evaluation and management service by telephone and the availability of in person appointments. The patient expressed understanding and agreed to proceed.  Interactive audio and video telecommunications were attempted between this nurse and patient, however failed, due to patient having technical difficulties OR patient did not have access to video capability.  We continued and completed visit with audio only.  Some vital signs may be absent or patient reported.   Tillie Rung, LPN  Cardiac Risk Factors include: advanced age (>63men, >33 women);hypertension     Objective:    Today's Vitals   12/13/22 1551 12/13/22 1553  Weight: 180 lb (81.6 kg)   Height: 5\' 8"  (1.727 m)   PainSc:  0-No pain   Body mass index is 27.37 kg/m.     12/13/2022    4:01 PM 12/06/2022    3:50 PM 11/28/2022   10:02 AM 11/18/2022   10:04 AM 11/08/2022   11:12 AM 10/20/2022    5:44 PM 10/03/2022   11:13 AM  Advanced Directives  Does Patient Have a Medical Advance Directive? No No No No No No No  Would patient like information on creating a medical advance directive? No - Patient declined No - Patient declined No - Patient declined No - Patient declined No - Patient declined No - Patient declined No - Patient declined    Current Medications (verified) Outpatient Encounter Medications as of 12/13/2022  Medication Sig   albuterol (VENTOLIN HFA) 108 (90 Base) MCG/ACT inhaler USE 2  INHALATIONS BY MOUTH  INTO THE LUNGS EVERY 6  HOURS AS NEEDED FOR  SHORTNESS OF BREATH OR  WHEEZING   amLODipine (NORVASC) 10 MG tablet Take 1 tablet (10 mg total) by mouth daily.   atorvastatin (LIPITOR) 40 MG tablet TAKE 1 TABLET BY MOUTH AT  BEDTIME   baclofen (LIORESAL) 10 MG tablet Take 0.5 tablets (5 mg total) by mouth 3 (three) times daily. (Patient not taking: Reported on 11/28/2022)   Budeson-Glycopyrrol-Formoterol (BREZTRI AEROSPHERE) 160-9-4.8 MCG/ACT AERO Inhale 2 puffs into the lungs in the morning and at bedtime.   calcium-vitamin D (OSCAL WITH D) 500-5 MG-MCG tablet Take 1 tablet by mouth daily with breakfast.   Cholecalciferol (VITAMIN D) 50 MCG (2000 UT) tablet Take 1 tablet (2,000 Units total) by mouth daily.   citalopram (CELEXA) 10 MG tablet Take 1 tablet (10 mg total) by mouth daily. Follow up in 3-4 weeks for side effect check.   diclofenac Sodium (VOLTAREN) 1 % GEL Apply 2 g topically 4 (four) times daily. Apply to painful area of ribs. Need to use regularly for relief.   fentaNYL (DURAGESIC) 25 MCG/HR Place 1 patch onto the skin every 3 (three) days.   fluticasone (FLONASE) 50 MCG/ACT nasal spray SHAKE LIQUID AND USE 2 SPRAYS IN EACH NOSTRIL DAILY   gabapentin (NEURONTIN) 600 MG tablet Take 1 tablet (600 mg total) by mouth 3 (three) times daily.   hydrOXYzine (ATARAX) 50 MG tablet Take 2 tablets (100 mg total)  by mouth at bedtime as needed for anxiety or itching (sleep).   ibuprofen (ADVIL) 800 MG tablet Take 1 tablet (800 mg total) by mouth every 8 (eight) hours as needed.   lidocaine (LIDODERM) 5 % Place 1 patch onto the skin daily. Remove & Discard patch within 12 hours or as directed by MD   MELATONIN GUMMIES PO Take 10 mg by mouth at bedtime.   naloxone (NARCAN) nasal spray 4 mg/0.1 mL Use for suspected opioid overdose - too sleepy to wake up, difficulty breathing   ondansetron (ZOFRAN) 4 MG tablet Take 1 tablet (4 mg total) by mouth every 8 (eight) hours as needed for  nausea or vomiting.   pantoprazole (PROTONIX) 40 MG tablet Take 1 tablet (40 mg total) by mouth daily as needed (acid indigestion/heartburn.).   polyethylene glycol powder (GLYCOLAX/MIRALAX) 17 GM/SCOOP powder Take 17 g by mouth 2 (two) times daily as needed.   senna (SENOKOT) 8.6 MG TABS tablet Take 1 tablet (8.6 mg total) by mouth at bedtime.   triamcinolone cream (KENALOG) 0.1 % Apply 1 Application topically 2 (two) times daily.   No facility-administered encounter medications on file as of 12/13/2022.    Allergies (verified) Codeine and Prednisone   History: Past Medical History:  Diagnosis Date   Abnormal CT lung screening 07/10/2020   Anxiety    Anxiety state 02/28/2012   GAD7 : 6  PHQ9 : 6   Arthritis    Bladder cancer 09/2020   urologist--- dr Berneice Heinrich, incidental finding on PET scan 12/ 2021,  s/p TURBT 09-18-2020 high grade papillary urothelial    Cancer related pain 03/11/2022   CAP (community acquired pneumonia) 04/20/2022   Centrilobular emphysema    Chronic left shoulder pain 04/29/2021   Close exposure to COVID-19 virus 05/03/2019   Constipation 04/09/2019   Costochondritis, acute 08/14/2020   DDD (degenerative disc disease), lumbar    Depression    DOE (dyspnea on exertion)    10-29-2020  per pt can do house work without sob, when walks/ stairs stops frequently  (stated recovers quickly when stops)   Emphysema lung    Epidermoid cyst 10/29/2018   Recurrent. Located on left hip at iliac crest 2" anterior to midaxillary line. Removed 2013, 2020, 2022.    Epigastric pain 11/16/2020   Fibroid 2003   GERD (gastroesophageal reflux disease)    History of radiation therapy    Left lung- 10/06/20-10/16/20- Dr. Antony Blackbird   Leg cramping 11/13/2019   Lumbar spine pain 01/09/2015   Menopausal vaginal dryness 11/13/2019   Multiple closed fractures of ribs of left side 08/20/2022   Nocturnal enuresis 06/23/2020   Chronic overnight "leaking" of unknown source. Likely  urine, but reports no color or odor. Volume enough for liners or light pads. Occurs every night.    Primary adenocarcinoma of lower lobe of left lung 07/2020   oncologist--- dr Arbutus Ped---  dx 11/ 2021 bilateral lower lobe masses,  s/p bronchoscopy w/ bx's 07-28-2020 malignancy cells on left ;  completed SBRT bilateral lower lung 10-16-2020 5 fractions   Radiation-induced dermatitis    on 10-29-2020 pt complaint stomach and chest areas,  completed SBRT 10-16-2020   Seasonal allergies    Stage 3 severe COPD by GOLD classification    pulmonologist--- dr Tonia Brooms,  using anoro inhaler daily   Wears glasses    Past Surgical History:  Procedure Laterality Date   ANTERIOR CERVICAL DECOMP/DISCECTOMY FUSION  03-08-2003 @ [facility]   C3 --- C6   ARTHROTOMY Right  11/09/2015   Procedure: RIGHT WRIST DISTAL RADIAL ULNAR JOINT ARTHROTOMY AND DEBRIDEMENT,  AND ;  Surgeon: Bradly Bienenstock, MD;  Location: [facility];  Service: Orthopedics;  Laterality: Right;   BRONCHIAL BIOPSY  07/28/2020   Procedure: BRONCHIAL BIOPSIES;  Surgeon: Josephine Igo, DO;  Location: [facility];  Service: Pulmonary;;   BRONCHIAL BIOPSY  08/17/2021   Procedure: BRONCHIAL BIOPSIES;  Surgeon: Josephine Igo, DO;  Location: [facility];  Service: Pulmonary;;   BRONCHIAL BRUSHINGS  07/28/2020   Procedure: BRONCHIAL BRUSHINGS;  Surgeon: Josephine Igo, DO;  Location: [facility];  Service: Pulmonary;;   BRONCHIAL BRUSHINGS  08/17/2021   Procedure: BRONCHIAL BRUSHINGS;  Surgeon: Josephine Igo, DO;  Location: [facility];  Service: Pulmonary;;   BRONCHIAL NEEDLE ASPIRATION BIOPSY  07/28/2020   Procedure: BRONCHIAL NEEDLE ASPIRATION BIOPSIES;  Surgeon: Josephine Igo, DO;  Location: [facility];  Service: Pulmonary;;   BRONCHIAL WASHINGS  07/28/2020   Procedure: BRONCHIAL WASHINGS;  Surgeon: Josephine Igo, DO;  Location: [facility];  Service: Pulmonary;;   BRONCHIAL WASHINGS  08/17/2021   Procedure: BRONCHIAL WASHINGS;  Surgeon:  Josephine Igo, DO;  Location: [facility];  Service: Pulmonary;;   COLONOSCOPY     CYSTOSCOPY WITH RETROGRADE PYELOGRAM, URETEROSCOPY AND STENT PLACEMENT N/A 09/18/2020   Procedure: CYSTOSCOPY WITH RETROGRADE PYELOGRAM bilateral,URETEROSCOPY AND  STENT PLACEMENT right ureter;  Surgeon: Sebastian Ache, MD;  Location: [facility];  Service: Urology;  Laterality: N/A;  1 HR   HARDWARE REMOVAL Right 07/24/2013   Procedure: RIGHT WRIST HARDWARE REMOVAL, JOINT RELEASE, RIGHT HAND MANIPULATION UNDER ANESTHESIA;  Surgeon: Sharma Covert, MD;  Location: [facility];  Service: Orthopedics;  Laterality: Right;   MASS EXCISION Left 10/14/2021   Procedure: EXCISION LEFT BACK CYSTIC MASS;  Surgeon: Sheliah Hatch, De Blanch, MD;  Location[facility] ;  Service: General;  Laterality: Left;   OPEN REDUCTION INTERNAL FIXATION (ORIF) DISTAL RADIAL FRACTURE Right 11/08/2012   Procedure: OPEN REDUCTION INTERNAL FIXATION (ORIF) RIGHT DISTAL RADIUS FRACTURE;  Surgeon: Sharma Covert, MD;  Location: [facility];  Service: Orthopedics;  Laterality: Right;   TRANSURETHRAL RESECTION OF BLADDER TUMOR N/A 09/18/2020   Procedure: TRANSURETHRAL RESECTION OF BLADDER TUMOR (TURBT);  Surgeon: Sebastian Ache, MD;  Location: [facility];  Service: Urology;  Laterality: N/A;   TRANSURETHRAL RESECTION OF BLADDER TUMOR N/A 11/04/2020   Procedure: RESTAGING TRANSURETHRAL RESECTION OF BLADDER TUMOR (TURBT); BILATERAL RETROGRADE PYELOGRAM;  Surgeon: Sebastian Ache, MD;  Location: [facility];  Service: Urology;  Laterality: N/A;  1 HR   VIDEO BRONCHOSCOPY WITH ENDOBRONCHIAL NAVIGATION N/A 07/28/2020   Procedure: VIDEO BRONCHOSCOPY WITH ENDOBRONCHIAL NAVIGATION;  Surgeon: Josephine Igo, DO;  Location: [facility];  Service: Pulmonary;  Laterality: N/A;   VIDEO BRONCHOSCOPY WITH RADIAL ENDOBRONCHIAL ULTRASOUND  08/17/2021   Procedure: VIDEO BRONCHOSCOPY WITH RADIAL ENDOBRONCHIAL ULTRASOUND;   Surgeon: Josephine Igo, DO;  Location: [facility];  Service: Pulmonary;;   WRIST ARTHROPLASTY Right 11/09/2015   Procedure: OR DISTAL ULNAR RESECTION ARTHROPLASTY ;  Surgeon: Bradly Bienenstock, MD;  Location: [facility];  Service: Orthopedics;  Laterality: Right;   WRIST ARTHROSCOPY WITH DEBRIDEMENT Right 07/21/2014   Procedure: RIGHT WRIST DISTAL RADIAL ULNAR JOINT DEBRIDEMENT AND JOINT RELEASE POSSIBLE TENDON INTERPOSITION;  Surgeon: Sharma Covert, MD;  Location: [facility];  Service: Orthopedics;  Laterality: Right;   Family History  Problem Relation Age of Onset   Hypertension Mother    Hyperlipidemia Mother    Dementia Father  Lung cancer Father    Parkinson's disease Father    Early death Sister 32       Drug overdose   Throat cancer Brother    Hypertension Daughter    Diabetes Daughter    Social History   Socioeconomic History   Marital status: Married    Spouse name: Sherilyn Cooter Crowl   Number of children: 2   Years of education: 12   Highest education level: Not on file  Occupational History   Occupation: Unemployed     Employer:  [employer]    Comment: Sweep   Tobacco Use   Smoking status: Former    Packs/day: 1.00    Years: 50.00    Additional pack years: 0.00    Total pack years: 50.00    Types: Cigarettes    Start date: 04/13/1975    Quit date: 07/14/2020    Years since quitting: 2.4    Passive exposure: Past   Smokeless tobacco: Never  Vaping Use   Vaping Use: Never used  Substance and Sexual Activity   Alcohol use: Yes    Comment: Holidays only   Drug use: Never   Sexual activity: Not Currently    Partners: Male    Birth control/protection: Post-menopausal  Other Topics Concern   Not on file  Social History Narrative   Patient lives alone currently in [place].    Patient is married, they are living separately at this time.    Patient enjoys watching tv, cooking, and spending time with her children and grandchildren.    Social Determinants of Health    Financial Resource Strain: Low Risk  (12/13/2022)   Overall Financial Resource Strain (CARDIA)    Difficulty of Paying Living Expenses: Not hard at all  Food Insecurity: No Food Insecurity (12/13/2022)   Hunger Vital Sign    Worried About Running Out of Food in the Last Year: Never true    Ran Out of Food in the Last Year: Never true  Transportation Needs: No Transportation Needs (12/13/2022)   PRAPARE - Administrator, Civil Service (Medical): No    Lack of Transportation (Non-Medical): No  Physical Activity: Inactive (12/13/2022)   Exercise Vital Sign    Days of Exercise per Week: 0 days    Minutes of Exercise per Session: 0 min  Stress: No Stress Concern Present (12/13/2022)   [employer] of Occupational Health - Occupational Stress Questionnaire    Feeling of Stress : Not at all  Social Connections: Moderately Integrated (12/13/2022)   Social Connection and Isolation Panel [NHANES]    Frequency of Communication with Friends and Family: More than three times a week    Frequency of Social Gatherings with Friends and Family: More than three times a week    Attends Religious Services: More than 4 times per year    Active Member of [employer] or Organizations: Yes    Attends Engineer, structural: More than 4 times per year    Marital Status: Separated    Tobacco Counseling Counseling given: Not Answered   Clinical Intake:  Pre-visit preparation completed: No  Pain : No/denies pain Pain Score: 0-No pain     BMI - recorded: 27.37 Nutritional Status: BMI 25 -29 Overweight Nutritional Risks: None Diabetes: No  How often do you need to have someone help you when you read instructions, pamphlets, or other written materials from your doctor or pharmacy?: 1 - Never  Diabetic?  No  Interpreter Needed?: No  Information entered  by :: Theresa Mulligan LPN   Activities of Daily Living    12/13/2022    3:58 PM  In your present state of health, do you have any  difficulty performing the following activities:  Hearing? 0  Vision? 0  Difficulty concentrating or making decisions? 0  Walking or climbing stairs? 0  Dressing or bathing? 0  Doing errands, shopping? 0  Preparing Food and eating ? N  Using the Toilet? N  In the past six months, have you accidently leaked urine? Y  Comment Wears pads. Followed by PCP  Do you have problems with loss of bowel control? N  Managing your Medications? N  Managing your Finances? N  Housekeeping or managing your Housekeeping? N    Patient Care Team: Fayette Pho, MD as PCP - General (Family Medicine) Mardella Layman, MD as Consulting Physician (Gastroenterology) Genia Del, Daisy Blossom, MD as Consulting Physician (Ophthalmology)  Indicate any recent Medical Services you may have received from other than [facility] providers in the past year (date may be approximate).     Assessment:   This is a routine wellness examination for Ludmila.  Hearing/Vision screen Hearing Screening - Comments:: Denies hearing difficulties   Vision Screening - Comments:: Wears rx glasses - up to date with [facility] exams with  [facility]  Dietary issues and exercise activities discussed: Exercise limited by: None identified   Goals Addressed               This Visit's Progress     Increase physical activity (pt-stated)        Lose Weight.       Depression Screen    12/13/2022    3:58 PM 11/18/2022   10:08 AM 11/08/2022   11:11 AM 10/03/2022   12:12 PM 09/19/2022    3:02 PM 08/19/2022    3:17 PM 05/27/2022    1:57 PM  PHQ 2/9 Scores  PHQ - 2 Score 0   0   0  PHQ- 9 Score    2   3  Exception Documentation  Patient refusal Patient refusal  Patient refusal Patient refusal     Fall Risk    12/13/2022    4:00 PM 11/08/2022   11:11 AM 10/03/2022   11:14 AM 09/19/2022    3:04 PM 08/19/2022    3:17 PM  Fall Risk   Falls in the past year? 1 0 0 0 0  Number falls in past yr: 0 0 0 0 0  Injury with Fall? 0 0 0 0 0   Risk for fall due to : No Fall Risks  No Fall Risks    Follow up Falls prevention discussed  Falls prevention discussed      FALL RISK PREVENTION PERTAINING TO THE HOME:  Any stairs in or around the home? No If so, are there any without handrails? No  Home free of loose throw rugs in walkways, pet beds, electrical cords, etc? Yes  Adequate lighting in your home to reduce risk of falls? Yes   ASSISTIVE DEVICES UTILIZED TO PREVENT FALLS:  Life alert? No  Use of a cane, walker or w/c? No  Grab bars in the bathroom? Yes  Shower chair or bench in shower? No  Elevated toilet seat or a handicapped toilet? No   TIMED UP AND GO:  Was the test performed? No . Audio Visit   Cognitive Function:        12/13/2022    4:01 PM 09/26/2019  9:46 AM  6CIT Screen  What Year? 0 points 0 points  What month? 0 points 0 points  What time? 0 points 0 points  Count back from 20 0 points 2 points  Months in reverse 0 points 2 points  Repeat phrase 0 points 0 points  Total Score 0 points 4 points    Immunizations Immunization History  Administered Date(s) Administered   COVID-19, mRNA, vaccine(Comirnaty)12 years and older 11/08/2022   Fluad Quad(high Dose 65+) 06/18/2021, 10/03/2022   Influenza,inj,Quad PF,6+ Mos 07/19/2013, 07/17/2014, 06/08/2015, 10/07/2016, 09/13/2017, 06/27/2018, 06/14/2019, 06/17/2020   PFIZER Comirnaty(Gray Top)Covid-19 Tri-Sucrose Vaccine 04/26/2021   PFIZER(Purple Top)SARS-COV-2 Vaccination 12/05/2019, 12/30/2019, 06/22/2020   PNEUMOCOCCAL CONJUGATE-20 10/03/2022   PPD Test 10/12/2012   Pneumococcal Conjugate-13 06/23/2020   Pneumococcal Polysaccharide-23 07/17/2014   Tdap 03/30/2012   Zoster Recombinat (Shingrix) 12/09/2017, 04/03/2018    TDAP status: Due, Education has been provided regarding the importance of this vaccine. Advised may receive this vaccine at local pharmacy or Health Dept. Aware to provide a copy of the vaccination record if obtained from  local pharmacy or Health Dept. Verbalized acceptance and understanding.  Flu Vaccine status: Up to date  Pneumococcal vaccine status: Up to date  Covid-19 vaccine status: Completed vaccines  Qualifies for Shingles Vaccine? Yes   Zostavax completed Yes   Shingrix Completed?: Yes  Screening Tests Health Maintenance  Topic Date Due   DTaP/Tdap/Td (2 - Td or Tdap) 03/30/2022   COLONOSCOPY (Pts 45-16yrs Insurance coverage will need to be confirmed)  12/13/2023 (Originally 04/25/2022)   COVID-19 Vaccine (6 - 2023-24 season) 01/03/2023   INFLUENZA VACCINE  04/13/2023   Medicare Annual Wellness (AWV)  12/13/2023   MAMMOGRAM  09/27/2024   Pneumonia Vaccine 63+ Years old  Completed   DEXA SCAN  Completed   Hepatitis C Screening  Completed   Zoster Vaccines- Shingrix  Completed   HPV VACCINES  Aged Out    Health Maintenance  Health Maintenance Due  Topic Date Due   DTaP/Tdap/Td (2 - Td or Tdap) 03/30/2022    Colorectal cancer screening: Referral to GI placed Deferred. Pt aware the office will call re: appt.  Mammogram status: Completed 09/27/22. Repeat every year  Bone Density status: Completed 01/12/21. Results reflect: Bone density results: OSTEOPOROSIS. Repeat every   years.  Lung Cancer Screening: (Low Dose CT Chest recommended if Age 32-80 years, 30 pack-year currently smoking OR have quit w/in 15years.) does not qualify.     Additional Screening:  Hepatitis C Screening: does qualify; Completed 05/22/15  Vision Screening: Recommended annual ophthalmology exams for early detection of glaucoma and other disorders of the eye. Is the patient up to date with their annual eye exam?  Yes  Who is the provider or what is the name of the office in which the patient attends annual eye exams? [facility]  If pt is not established with a provider, would they like to be referred to a provider to establish care? No .   Dental Screening: Recommended annual dental exams for proper oral  hygiene  Community Resource Referral / Chronic Care Management:  CRR required this visit?  No   CCM required this visit?  No      Plan:     I have personally reviewed and noted the following in the patient's chart:   Medical and social history Use of alcohol, tobacco or illicit drugs  Current medications and supplements including opioid prescriptions. Patient is currently taking opioid prescriptions. Information provided to patient regarding non-opioid  alternatives. Patient advised to discuss non-opioid treatment plan with their provider. Functional ability and status Nutritional status Physical activity Advanced directives List of other physicians Hospitalizations, surgeries, and ER visits in previous 12 months Vitals Screenings to include cognitive, depression, and falls Referrals and appointments  In addition, I have reviewed and discussed with patient certain preventive protocols, quality metrics, and best practice recommendations. A written personalized care plan for preventive services as well as general preventive health recommendations were provided to patient.     Tillie Rung, LPN   03/20/2955   Nurse Notes: None    I have reviewed this visit and agree with the documentation.  Marshall L Chambliss

## 2022-12-14 ENCOUNTER — Ambulatory Visit (INDEPENDENT_AMBULATORY_CARE_PROVIDER_SITE_OTHER): Payer: 59 | Admitting: Gastroenterology

## 2022-12-14 ENCOUNTER — Encounter: Payer: Self-pay | Admitting: Gastroenterology

## 2022-12-14 VITALS — BP 120/78 | HR 81 | Ht 68.0 in | Wt 179.0 lb

## 2022-12-14 DIAGNOSIS — C3432 Malignant neoplasm of lower lobe, left bronchus or lung: Secondary | ICD-10-CM

## 2022-12-14 DIAGNOSIS — Z1211 Encounter for screening for malignant neoplasm of colon: Secondary | ICD-10-CM | POA: Diagnosis not present

## 2022-12-14 MED ORDER — NA SULFATE-K SULFATE-MG SULF 17.5-3.13-1.6 GM/177ML PO SOLN: 1.0000 | Freq: Once | ORAL | 0 refills | Status: AC

## 2022-12-14 MED ORDER — ONDANSETRON HCL 4 MG PO TABS: 4.0000 mg | ORAL_TABLET | Freq: Three times a day (TID) | ORAL | 0 refills | Status: DC | PRN

## 2022-12-14 NOTE — Patient Instructions (Signed)
_______________________________________________________  If your blood pressure at your visit was 140/90 or greater, please contact your primary care physician to follow up on this.  _______________________________________________________  If you are age 68 or older, your body mass index should be between 23-30. Your Body mass index is 27.22 kg/m. If this is out of the aforementioned range listed, please consider follow up with your Primary Care Provider.  If you are age 22 or younger, your body mass index should be between 19-25. Your Body mass index is 27.22 kg/m. If this is out of the aformentioned range listed, please consider follow up with your Primary Care Provider.   You have been scheduled for a colonoscopy. Please follow written instructions given to you at your visit today.  Please pick up your prep supplies at the pharmacy within the next 1-3 days. If you use inhalers (even only as needed), please bring them with you on the day of your procedure.  We have sent the following medications to your pharmacy for you to pick up at your convenience: Zofran 4 mg to take with prep.  The [facility] providers would like to encourage you to use [facility] to communicate with providers for non-urgent requests or questions.  Due to long hold times on the telephone, sending your provider a message by [facility] may be a faster and more efficient way to get a response.  Please allow 48 business hours for a response.  Please remember that this is for non-urgent requests.   It was a pleasure to see you today!  Thank you for trusting me with your gastrointestinal care!    Scott E.Candis Schatz, MD

## 2022-12-14 NOTE — Progress Notes (Unsigned)
HPI : [name] is a very pleasant 67 year old female with a history of bladder and lung cancer who I initially saw in July 2022 with symptoms of epigastric pain following radiation for her lung cancer.  By the time I had seen her, her symptoms had resolved and no further work up was recommended. Today, she presents to clinic to discuss screening colonoscopy.  She had a colonoscopy in 2013 for screening.  A poor bowel prep was reported, but she was cleared to repeat in 10 years.   She reports having difficulty drinking all the prep last time.  She currently denies any GI symptoms to include abdominal pain, constipation, diarrhea or blood in the stool.  She has a bowel movement about every other day which is her norm. She denies any upper GI symptoms to include dysphagia, heartburn/acid regurgitation or dyspepsia.  She is scheduled for PET scan this Friday to assess the status of her lung cancer.    Past Medical History:  Diagnosis Date   Abnormal CT lung screening 07/10/2020   Anxiety    Anxiety state 02/28/2012   GAD7 : 6  PHQ9 : 6   Arthritis    Bladder cancer 09/2020   urologist--- dr Tresa Moore, incidental finding on PET scan 12/ 2021,  s/p TURBT 09-18-2020 high grade papillary urothelial    Cancer related pain 03/11/2022   CAP (community acquired pneumonia) 04/20/2022   Centrilobular emphysema    Chronic left shoulder pain 04/29/2021   Close exposure to COVID-19 virus 05/03/2019   Constipation 04/09/2019   Costochondritis, acute 08/14/2020   DDD (degenerative disc disease), lumbar    Depression    DOE (dyspnea on exertion)    10-29-2020  per pt can do house work without sob, when walks/ stairs stops frequently  (stated recovers quickly when stops)   Emphysema lung    Epidermoid cyst 10/29/2018   Recurrent. Located on left hip at iliac crest 2" anterior to midaxillary line. Removed 2013, 2020, 2022.    Epigastric pain 11/16/2020   Fibroid 2003   GERD (gastroesophageal reflux  disease)    History of radiation therapy    Left lung- 10/06/20-10/16/20- Dr. Gery Pray   Leg cramping 11/13/2019   Lumbar spine pain 01/09/2015   Menopausal vaginal dryness 11/13/2019   Multiple closed fractures of ribs of left side 08/20/2022   Nocturnal enuresis 06/23/2020   Chronic overnight "leaking" of unknown source. Likely urine, but reports no color or odor. Volume enough for liners or light pads. Occurs every night.    Primary adenocarcinoma of lower lobe of left lung 07/2020   oncologist--- dr Julien Nordmann---  dx 11/ 2021 bilateral lower lobe masses,  s/p bronchoscopy w/ bx's 07-28-2020 malignancy cells on left ;  completed SBRT bilateral lower lung 10-16-2020 5 fractions   Radiation-induced dermatitis    on 10-29-2020 pt complaint stomach and chest areas,  completed SBRT 10-16-2020   Seasonal allergies    Stage 3 severe COPD by GOLD classification    pulmonologist--- dr Valeta Harms,  using anoro inhaler daily   Wears glasses      Past Surgical History:  Procedure Laterality Date   ANTERIOR CERVICAL DECOMP/DISCECTOMY FUSION  03-08-2003 @ [facility]   C3 --- C6   ARTHROTOMY Right 11/09/2015   Procedure: RIGHT WRIST DISTAL RADIAL ULNAR JOINT ARTHROTOMY AND DEBRIDEMENT,  AND ;  Surgeon: Iran Planas, MD;  Location: [place];  Service: Orthopedics;  Laterality: Right;   BRONCHIAL BIOPSY  07/28/2020   Procedure: BRONCHIAL  BIOPSIES;  Surgeon: [name], DO;  Location:  [facility];  Service: Pulmonary;;   BRONCHIAL BIOPSY  08/17/2021   Procedure: BRONCHIAL BIOPSIES;  Surgeon: [name], DO;  Location: [place] [facility];  Service: Pulmonary;;   BRONCHIAL BRUSHINGS  07/28/2020   Procedure: BRONCHIAL BRUSHINGS;  Surgeon: [name], DO;  Location: [place] [facility];  Service: Pulmonary;;   BRONCHIAL BRUSHINGS  08/17/2021   Procedure: BRONCHIAL BRUSHINGS;  Surgeon: [name], DO;  Location: [place];  Service: Pulmonary;;   BRONCHIAL NEEDLE ASPIRATION BIOPSY  07/28/2020    Procedure: BRONCHIAL NEEDLE ASPIRATION BIOPSIES;  Surgeon: [name], DO;  Location: [place];  Service: Pulmonary;;   BRONCHIAL WASHINGS  07/28/2020   Procedure: BRONCHIAL WASHINGS;  Surgeon: [name], DO;  Location: [place];  Service: Pulmonary;;   BRONCHIAL WASHINGS  08/17/2021   Procedure: BRONCHIAL WASHINGS;  Surgeon: [name], DO;  Location: [place];  Service: Pulmonary;;   COLONOSCOPY     CYSTOSCOPY WITH RETROGRADE PYELOGRAM, URETEROSCOPY AND STENT PLACEMENT N/A 09/18/2020   Procedure: CYSTOSCOPY WITH RETROGRADE PYELOGRAM bilateral,URETEROSCOPY AND  STENT PLACEMENT right ureter;  Surgeon: Alexis Frock, MD;  Location: [facility];  Service: Urology;  Laterality: N/A;  1 HR   HARDWARE REMOVAL Right 07/24/2013   Procedure: RIGHT WRIST HARDWARE REMOVAL, JOINT RELEASE, RIGHT HAND MANIPULATION UNDER ANESTHESIA;  Surgeon: Linna Hoff, MD;  Location: [place];  Service: Orthopedics;  Laterality: Right;   MASS EXCISION Left 10/14/2021   Procedure: EXCISION LEFT BACK CYSTIC MASS;  Surgeon: Kieth Brightly, Arta Bruce, MD;  Location: [place];  Service: General;  Laterality: Left;   OPEN REDUCTION INTERNAL FIXATION (ORIF) DISTAL RADIAL FRACTURE Right 11/08/2012   Procedure: OPEN REDUCTION INTERNAL FIXATION (ORIF) RIGHT DISTAL RADIUS FRACTURE;  Surgeon: Linna Hoff, MD;  Location: [place];  Service: Orthopedics;  Laterality: Right;   TRANSURETHRAL RESECTION OF BLADDER TUMOR N/A 09/18/2020   Procedure: TRANSURETHRAL RESECTION OF BLADDER TUMOR (TURBT);  Surgeon: Alexis Frock, MD;  Location: [facility];  Service: Urology;  Laterality: N/A;   TRANSURETHRAL RESECTION OF BLADDER TUMOR N/A 11/04/2020   Procedure: RESTAGING TRANSURETHRAL RESECTION OF BLADDER TUMOR (TURBT); BILATERAL RETROGRADE PYELOGRAM;  Surgeon: Alexis Frock, MD;  Location: [facility];  Service: Urology;  Laterality: N/A;  1 HR   VIDEO  BRONCHOSCOPY WITH ENDOBRONCHIAL NAVIGATION N/A 07/28/2020   Procedure: VIDEO BRONCHOSCOPY WITH ENDOBRONCHIAL NAVIGATION;  Surgeon: [name], DO;  Location: [place];  Service: Pulmonary;  Laterality: N/A;   VIDEO BRONCHOSCOPY WITH RADIAL ENDOBRONCHIAL ULTRASOUND  08/17/2021   Procedure: VIDEO BRONCHOSCOPY WITH RADIAL ENDOBRONCHIAL ULTRASOUND;  Surgeon: [name], DO;  Location: [place];  Service: Pulmonary;;   WRIST ARTHROPLASTY Right 11/09/2015   Procedure: OR DISTAL ULNAR RESECTION ARTHROPLASTY ;  Surgeon: Iran Planas, MD;  Location: [place];  Service: Orthopedics;  Laterality: Right;   WRIST ARTHROSCOPY WITH DEBRIDEMENT Right 07/21/2014   Procedure: RIGHT WRIST DISTAL RADIAL ULNAR JOINT DEBRIDEMENT AND JOINT RELEASE POSSIBLE TENDON INTERPOSITION;  Surgeon: Linna Hoff, MD;  Location: [place];  Service: Orthopedics;  Laterality: Right;   Family History  Problem Relation Age of Onset   Hypertension Mother    Hyperlipidemia Mother    Dementia Father    Lung cancer Father    Parkinson's disease Father    Early death Sister 63       Drug overdose   Throat cancer Brother    Hypertension Daughter    Diabetes  Daughter    Social History   Tobacco Use   Smoking status: Former    Packs/day: 1.00    Years: 50.00    Additional pack years: 0.00    Total pack years: 50.00    Types: Cigarettes    Start date: 04/13/1975    Quit date: 07/14/2020    Years since quitting: 2.4    Passive exposure: Past   Smokeless tobacco: Never  Vaping Use   Vaping Use: Never used  Substance Use Topics   Alcohol use: Yes    Comment: Holidays only   Drug use: Never   Current Outpatient Medications  Medication Sig Dispense Refill   albuterol (VENTOLIN HFA) 108 (90 Base) MCG/ACT inhaler USE 2 INHALATIONS BY MOUTH  INTO THE LUNGS EVERY 6  HOURS AS NEEDED FOR  SHORTNESS OF BREATH OR  WHEEZING 26.8 g 6   amLODipine (NORVASC) 10 MG tablet Take 1 tablet (10 mg total) by mouth daily. 30 tablet 3    atorvastatin (LIPITOR) 40 MG tablet TAKE 1 TABLET BY MOUTH AT  BEDTIME 100 tablet 2   Budeson-Glycopyrrol-Formoterol (BREZTRI AEROSPHERE) 160-9-4.8 MCG/ACT AERO Inhale 2 puffs into the lungs in the morning and at bedtime. 21.4 g 13   calcium-vitamin D (OSCAL WITH D) 500-5 MG-MCG tablet Take 1 tablet by mouth daily with breakfast. 30 tablet 3   Cholecalciferol (VITAMIN D) 50 MCG (2000 UT) tablet Take 1 tablet (2,000 Units total) by mouth daily. 90 tablet 3   citalopram (CELEXA) 10 MG tablet Take 1 tablet (10 mg total) by mouth daily. Follow up in 3-4 weeks for side effect check. 60 tablet 1   diclofenac Sodium (VOLTAREN) 1 % GEL Apply 2 g topically 4 (four) times daily. Apply to painful area of ribs. Need to use regularly for relief. 100 g 2   fentaNYL (DURAGESIC) 25 MCG/HR Place 1 patch onto the skin every 3 (three) days. 10 patch 0   fluticasone (FLONASE) 50 MCG/ACT nasal spray SHAKE LIQUID AND USE 2 SPRAYS IN EACH NOSTRIL DAILY 16 g 6   gabapentin (NEURONTIN) 600 MG tablet Take 1 tablet (600 mg total) by mouth 3 (three) times daily. 270 tablet 1   hydrOXYzine (ATARAX) 50 MG tablet Take 2 tablets (100 mg total) by mouth at bedtime as needed for anxiety or itching (sleep). 60 tablet 2   ibuprofen (ADVIL) 800 MG tablet Take 1 tablet (800 mg total) by mouth every 8 (eight) hours as needed. 30 tablet 0   lidocaine (LIDODERM) 5 % Place 1 patch onto the skin daily. Remove & Discard patch within 12 hours or as directed by MD 30 patch 0   MELATONIN GUMMIES PO Take 10 mg by mouth at bedtime.     naloxone (NARCAN) nasal spray 4 mg/0.1 mL Use for suspected opioid overdose - too sleepy to wake up, difficulty breathing 2 each 4   ondansetron (ZOFRAN) 4 MG tablet Take 1 tablet (4 mg total) by mouth every 8 (eight) hours as needed for nausea or vomiting. 30 tablet 1   pantoprazole (PROTONIX) 40 MG tablet Take 1 tablet (40 mg total) by mouth daily as needed (acid indigestion/heartburn.). 30 tablet 2    polyethylene glycol powder (GLYCOLAX/MIRALAX) 17 GM/SCOOP powder Take 17 g by mouth 2 (two) times daily as needed. 3350 g 1   senna (SENOKOT) 8.6 MG TABS tablet Take 1 tablet (8.6 mg total) by mouth at bedtime. 120 tablet 0   triamcinolone cream (KENALOG) 0.1 % Apply 1 Application topically 2 (  two) times daily. 60 g 2   baclofen (LIORESAL) 10 MG tablet Take 0.5 tablets (5 mg total) by mouth 3 (three) times daily. (Patient not taking: Reported on 11/28/2022) 30 each 0   No current facility-administered medications for this visit.   Allergies  Allergen Reactions   Codeine Nausea And Vomiting and Other (See Comments)    Hallucinations   Prednisone Other (See Comments)    'Made me feel funny inside my head"-pt cannot be specific     Review of Systems: All systems reviewed and negative except where noted in HPI.    CT CHEST WO CONTRAST  Result Date: 11/28/2022 CLINICAL DATA:  Restaging lung cancer.  * Tracking Code: BO * EXAM: CT CHEST WITHOUT CONTRAST TECHNIQUE: Multidetector CT imaging of the chest was performed following the standard protocol without IV contrast. RADIATION DOSE REDUCTION: This exam was performed according to the departmental dose-optimization program which includes automated exposure control, adjustment of the mA and/or kV according to patient size and/or use of iterative reconstruction technique. COMPARISON:  Multiple previous imaging studies. The most recent is 05/25/2022 FINDINGS: Cardiovascular: The heart is normal in size. No pericardial effusion. Stable mild tortuosity and calcification of the thoracic aorta. Stable scattered coronary artery calcifications. Mediastinum/Nodes: Slight interval enlargement of 2 adjacent anterior mediastinal nodes. The right-sided node measures 8 mm and the left-sided node measures 7 mm. These measured 5 and 4 mm back in 2022 and have slowly enlarged. There is also a precarinal lymph node on image 53/2 which measures 10 mm and has enlarged since  prior studies. New large upper prevascular/anterior mediastinal node on the left side measuring 17 mm. No obvious hilar adenopathy without IV contrast. The esophagus is grossly normal. There is a small hiatal hernia. Lungs/Pleura: Stable emphysematous changes and areas of pulmonary scarring. Stable post treatment changes involving the superior segment of the left lower lobe with radiation fibrosis but no findings suspicious for recurrent tumor. Stable 6 mm anterior and medial left upper lobe nodule. Stable large cystic/cavitary lesion in the right lower lobe with some medial soft tissue thickening but no interval change. There are 2 small adjacent nodules in the right lower lobe adjacent to the major fissure. The more superior nodule measures 6 x 4 mm on sagittal image 19. This measured 5 x 3 mm previously. Recommend continued observation. The more inferior nodule measures 5 mm and appears stable. New 6 mm nodule at the right lung base on image 123/5. There is also a new small nodule along the lateral edge of a second small cystic lesion in the left lower lobe. This measures 4 mm on image 84/5. No pleural effusions or pleural lesions. Stable bandlike linear scarring changes in the left lower lobe. Upper Abdomen: No significant upper abdominal findings. No hepatic or adrenal gland lesions. Musculoskeletal: No breast masses, supraclavicular or axillary adenopathy. No findings suspicious for osseous metastatic disease. 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 IMPRESSION: 1. Stable post treatment changes involving the superior segment of the left lower lobe with radiation fibrosis but no findings suspicious for recurrent tumor in this area. 2. Stable large cystic/cavitary lesion in the right lower lobe with some medial soft tissue thickening but no interval change. 6 mm anterior and medial left upper lobe nodule is also stable. 3. Other  findings are worrisome for new/progressive metastatic disease, most notably a new large pre-vascular node and slightly enlarging precarinal and anterior mediastinal nodes. There are also  2 new small nodules in the right lower lobe and a slightly enlarging nodule in the right lower lobe along the major fissure. Recommend PET-CT for further evaluation. 4. No findings to suggest osseous metastatic disease or upper abdominal metastatic disease. 5. Stable ununited left sixth and seventh posterior rib fractures likely related to radiation. Aortic Atherosclerosis (ICD10-I70.0) and Emphysema (ICD10-J43.9). Electronically Signed   By: Marijo Sanes M.D.   On: 11/28/2022 09:13    Physical Exam: BP 120/78   Pulse 81   Ht 5\' 8"  (1.727 m)   Wt 179 lb (81.2 kg)   BMI 27.22 kg/m  Constitutional: Pleasant,well-developed, African American female in no acute distress. HEENT: Normocephalic and atraumatic. Conjunctivae are normal. No scleral icterus. Neck supple.  Cardiovascular: Normal rate, regular rhythm.  Pulmonary/chest: Effort normal and breath sounds normal. No wheezing, rales or rhonchi. Abdominal: Soft, nondistended, nontender. Bowel sounds active throughout. There are no masses palpable. No hepatomegaly. Extremities: no edema Neurological: Alert and oriented to person place and time. Skin: Skin is warm and dry. No rashes noted. Psychiatric: Normal mood and affect. Behavior is normal.  CBC    Component Value Date/Time   WBC 8.6 10/20/2022 1751   RBC 4.64 10/20/2022 1751   HGB 12.6 10/20/2022 1751   HGB 13.8 10/03/2022 1528   HCT 41.2 10/20/2022 1751   HCT 43.3 10/03/2022 1528   PLT 350 10/20/2022 1751   PLT 413 10/03/2022 1528   MCV 88.8 10/20/2022 1751   MCV 85 10/03/2022 1528   MCH 27.2 10/20/2022 1751   MCHC 30.6 10/20/2022 1751   RDW 14.4 10/20/2022 1751   RDW 13.5 10/03/2022 1528   LYMPHSABS 1.3 12/17/2020 1029   LYMPHSABS 2.2 06/23/2020 1237   MONOABS 0.7 12/17/2020 1029   EOSABS  0.2 12/17/2020 1029   EOSABS 0.2 06/23/2020 1237   BASOSABS 0.1 12/17/2020 1029   BASOSABS 0.1 06/23/2020 1237    CMP     Component Value Date/Time   NA 141 10/20/2022 1751   NA 143 10/03/2022 1528   K 4.0 10/20/2022 1751   CL 105 10/20/2022 1751   CO2 26 10/20/2022 1751   GLUCOSE 97 10/20/2022 1751   BUN 17 10/20/2022 1751   BUN 14 10/03/2022 1528   CREATININE 1.04 (H) 10/20/2022 1751   CREATININE 1.05 (H) 08/18/2020 1348   CREATININE 0.95 02/10/2015 1526   CALCIUM 9.5 10/20/2022 1751   PROT 7.6 10/03/2022 1528   ALBUMIN 4.7 10/03/2022 1528   AST 16 10/03/2022 1528   AST 25 08/18/2020 1348   ALT 17 10/03/2022 1528   ALT 37 08/18/2020 1348   ALKPHOS 98 10/03/2022 1528   BILITOT 1.1 10/03/2022 1528   BILITOT 1.0 08/18/2020 1348   GFRNONAA 59 (L) 10/20/2022 1751   GFRNONAA 59 (L) 08/18/2020 1348   GFRAA 69 06/23/2020 1237     ASSESSMENT AND PLAN: 67 year old female with history of bladder cancer and lung cancer, s/p XRT, undergoing restaging PET later this week, due for repeat screening colonoscopy.  She has no concerning GI symptoms.  Will schedule for colonoscopy with 2 day prep given her reported poor prep in 2013.  If her PET scan shows findings that warrant additional treatment for her lung cancer, we may elect to defer/delay her colonoscopy until her lung cancer treatment is complete.  Colon cancer screening - Colonoscopy, 2 day prep, pending PET results/oncology plan.  The details, risks (including bleeding, perforation, infection, missed lesions, medication reactions and possible hospitalization or surgery if complications  occur), benefits, and alternatives to colonoscopy with possible biopsy and possible polypectomy were discussed with the patient and she consents to proceed.   Sayer Masini E. Candis Schatz, [facility]  [name], MD

## 2022-12-16 ENCOUNTER — Ambulatory Visit (HOSPITAL_COMMUNITY)
Admission: RE | Admit: 2022-12-16 | Discharge: 2022-12-16 | Disposition: A | Discharge status: Home / Self Care | Payer: 59 | Source: Ambulatory Visit | Attending: Radiation Oncology | Admitting: Radiation Oncology

## 2022-12-16 DIAGNOSIS — C3490 Malignant neoplasm of unspecified part of unspecified bronchus or lung: Secondary | ICD-10-CM | POA: Diagnosis not present

## 2022-12-16 DIAGNOSIS — C3432 Malignant neoplasm of lower lobe, left bronchus or lung: Secondary | ICD-10-CM | POA: Insufficient documentation

## 2022-12-16 LAB — GLUCOSE, CAPILLARY: Glucose-Capillary: 95 mg/dL (ref 70–99)

## 2022-12-16 MED ORDER — FLUDEOXYGLUCOSE F - 18 (FDG) INJECTION
8.8000 | Freq: Once | INTRAVENOUS | Status: AC
  Administered 2022-12-16: 8.9 via INTRAVENOUS

## 2022-12-16 NOTE — Progress Notes (Signed)
Radiation Oncology         [phone] ________________________________  Name: Laura AlamoCarol B Garritano MRN: [id number]  Date: 12/19/2022  DOB: [birth year]  Follow-Up Visit Note  CC: [name], [name], MD  [name], Laura L, DO  No diagnosis found.  Diagnosis: The encounter diagnosis was Primary adenocarcinoma of lower lobe of left lung (HCC).   Stage IIA (T2b, N0, M0) non-small cell lung cancer, adenocarcinoma of the left lower lobe with suspicious right lower lobe cystic lesion   Interval Since Last Radiation: 2 years, 2 months, and 4 days   Radiation treatment dates:   1/25, 1/27, 1/31, 2/2, 10/16/20   Site/dose:      Narrative:  The patient returns today for routine follow-up and to review recent PET results. She was last seen here for follow-up on 11/28/22. To review from her last visit, the patient had a recent chest CT scan which demonstrated an area of potential recurrence within the mediastinum, as well as 2 new pulmonary nodules. In light of these findings, I recommended that she proceed with a PET scan for further evaluation of these areas.          Subsequent restaging PET scan on 12/16/22 unfortunately confirms interval disease progression, demonstrated by: a tracer avid mass in the right hilar region (not previously seen on CT); an adjacent FDG avid right hilar lymph node; the previously seen tracer avid anterior mediastinal, pre-vascular, and right paratracheal lymph nodes compatible with metabolically active nodal metastasis (stable); and similar appearance of the post treatment changes involving the superior segment of left lower lobe. PET otherwise showed no specific findings to suggest locally recurrent FDG avid tumor in the superior segment of the left lower lobe, and interval stability of the small right lung nodules.            Allergies:  is allergic to codeine and prednisone.  Meds: Current Outpatient Medications  Medication Sig Dispense Refill   albuterol (VENTOLIN HFA) 108  (90 Base) MCG/ACT inhaler USE 2 INHALATIONS BY MOUTH  INTO THE LUNGS EVERY 6  HOURS AS NEEDED FOR  SHORTNESS OF BREATH OR  WHEEZING 26.8 g 6   amLODipine (NORVASC) 10 MG tablet Take 1 tablet (10 mg total) by mouth daily. 30 tablet 3   atorvastatin (LIPITOR) 40 MG tablet TAKE 1 TABLET BY MOUTH AT  BEDTIME 100 tablet 2   baclofen (LIORESAL) 10 MG tablet Take 0.5 tablets (5 mg total) by mouth 3 (three) times daily. (Patient not taking: Reported on 11/28/2022) 30 each 0   Budeson-Glycopyrrol-Formoterol (BREZTRI AEROSPHERE) 160-9-4.8 MCG/ACT AERO Inhale 2 puffs into the lungs in the morning and at bedtime. 21.4 g 13   calcium-vitamin D (OSCAL WITH D) 500-5 MG-MCG tablet Take 1 tablet by mouth daily with breakfast. 30 tablet 3   Cholecalciferol (VITAMIN D) 50 MCG (2000 UT) tablet Take 1 tablet (2,000 Units total) by mouth daily. 90 tablet 3   citalopram (CELEXA) 10 MG tablet Take 1 tablet (10 mg total) by mouth daily. Follow up in 3-4 weeks for side effect check. 60 tablet 1   diclofenac Sodium (VOLTAREN) 1 % GEL Apply 2 g topically 4 (four) times daily. Apply to painful area of ribs. Need to use regularly for relief. 100 g 2   fentaNYL (DURAGESIC) 25 MCG/HR Place 1 patch onto the skin every 3 (three) days. 10 patch 0   fluticasone (FLONASE) 50 MCG/ACT nasal spray SHAKE LIQUID AND USE 2 SPRAYS IN EACH NOSTRIL DAILY 16 g 6  gabapentin (NEURONTIN) 600 MG tablet Take 1 tablet (600 mg total) by mouth 3 (three) times daily. 270 tablet 1   hydrOXYzine (ATARAX) 50 MG tablet Take 2 tablets (100 mg total) by mouth at bedtime as needed for anxiety or itching (sleep). 60 tablet 2   ibuprofen (ADVIL) 800 MG tablet Take 1 tablet (800 mg total) by mouth every 8 (eight) hours as needed. 30 tablet 0   lidocaine (LIDODERM) 5 % Place 1 patch onto the skin daily. Remove & Discard patch within 12 hours or as directed by MD 30 patch 0   MELATONIN GUMMIES PO Take 10 mg by mouth at bedtime.     naloxone (NARCAN) nasal spray 4  mg/0.1 mL Use for suspected opioid overdose - too sleepy to wake up, difficulty breathing 2 each 4   ondansetron (ZOFRAN) 4 MG tablet Take 1 tablet (4 mg total) by mouth every 8 (eight) hours as needed for nausea or vomiting. 30 tablet 1   ondansetron (ZOFRAN) 4 MG tablet Take 1 tablet (4 mg total) by mouth every 8 (eight) hours as needed for nausea or vomiting. Take one tablet before starting each prep. 3 tablet 0   pantoprazole (PROTONIX) 40 MG tablet Take 1 tablet (40 mg total) by mouth daily as needed (acid indigestion/heartburn.). 30 tablet 2   polyethylene glycol powder (GLYCOLAX/MIRALAX) 17 GM/SCOOP powder Take 17 g by mouth 2 (two) times daily as needed. 3350 g 1   senna (SENOKOT) 8.6 MG TABS tablet Take 1 tablet (8.6 mg total) by mouth at bedtime. 120 tablet 0   triamcinolone cream (KENALOG) 0.1 % Apply 1 Application topically 2 (two) times daily. 60 g 2   No current facility-administered medications for this encounter.    Physical Findings: The patient is in no acute distress. Patient is alert and oriented.  vitals were not taken for this visit. .  No significant changes. Lungs are clear to auscultation bilaterally. Heart has regular rate and rhythm. No palpable cervical, supraclavicular, or axillary adenopathy. Abdomen soft, non-tender, normal bowel sounds.   Lab Findings: Lab Results  Component Value Date   WBC 8.6 10/20/2022   HGB 12.6 10/20/2022   HCT 41.2 10/20/2022   MCV 88.8 10/20/2022   PLT 350 10/20/2022    Radiographic Findings: NM PET Image Restag (PS) Skull Base To Thigh  Result Date: 12/16/2022 CLINICAL DATA:  Subsequent treatment strategy for lung cancer . EXAM: NUCLEAR MEDICINE PET SKULL BASE TO THIGH TECHNIQUE: 8.95 mCi F-18 FDG was injected intravenously. Full-ring PET imaging was performed from the skull base to thigh after the radiotracer. CT data was obtained and used for attenuation correction and anatomic localization. Fasting blood glucose: 95 mg/dl  COMPARISON:  CT chest 11/24/2022, remote PET-CT from 08/17/2020 FINDINGS: Mediastinal blood pool activity: SUV max 2.56 Liver activity: SUV max NA NECK: No hypermetabolic lymph nodes in the neck. Incidental CT findings: None. CHEST: Left pre-vascular nodal mass is tracer avid measuring 3.1 cm with SUV max of 15.37, image 57/3. On the previous exam this measured 2.7 cm. Anterior mediastinal/retrosternal lymph node measures 0.9 x 1.5 cm with SUV max of 10.06, image 65/3. Previously this measured 1.3 x 0.8 cm. Tracer avid right paratracheal lymph node measures 1 cm with SUV max of 7.31, image 60/4. On the previous exam this measured the 1 cm. Tracer avid right hilar lymph node has an SUV max of 16.66, image 69 of the fused PET-CT images. Difficult to see on previous unenhanced CT of the chest. Tracer  avid tumor within the right hilum measures approximately 3.7 x 2.8 cm and has an SUV max of 21.79, image 75/3. On the exam from 11/24/2022 this lesion is difficult to see due to lack of intravenous contrast material but measured approximately 3.0 by 1.7 cm. Mild low level tracer uptake is associated with the area post treatment change involving the superior segment of left lower lobe with SUV max of 2.90, image 70/3. Adjacent displaced rib fractures posterior to this area are similar to the previous exam. The perifissural nodule in the along the major fissure is again seen measuring 8 mm with SUV max 1.25, image 73/3. Previously this was measured at 7 mm. Nodule in the posterior right lung base measures 7 mm without corresponding increased uptake, image 90/3. Previously 6 mm. Incidental CT findings: Moderate to severe paraseptal and centrilobular emphysema with bullous changes in the periphery of the right lower lobe. Aortic atherosclerosis. Coronary artery calcification. ABDOMEN/PELVIS: No abnormal hypermetabolic activity within the liver, pancreas, adrenal glands, or spleen. No hypermetabolic lymph nodes in the abdomen  or pelvis. Incidental CT findings: Upper pole right kidney cyst measures 4.6 cm without corresponding uptake. No follow-up imaging recommended. Aortic atherosclerotic calcifications. There is right-sided hydronephrosis with dilatation of the right renal collecting system to the UPJ. Normal caliber right ureter. No obstructing stone noted. SKELETON: No focal hypermetabolic activity to suggest skeletal metastasis. Incidental CT findings: None. IMPRESSION: 1. Interval progression of disease. 2. There is a tracer avid mass within the right hilar region. This was not reported on the previous exam likely reflecting lack of intravenous contrast material limiting assessment of the hilar regions. Also not reported is an adjacent FDG avid right hilar lymph node. 3. Similar size of previously reported anterior mediastinal, pre-vascular, and right paratracheal lymph nodes. These are tracer avid and compatible with metabolically active nodal metastasis. 4. Similar appearance of post treatment change involving the superior segment of left lower lobe. No specific findings identified to suggest locally recurrent FDG avid tumor in the superior segment of the left lower lobe. 5. Small right lung nodules are not significantly changed in size from the previous exam. 6. Right-sided hydronephrosis with dilatation of the right renal collecting system to the UPJ. No obstructing stone noted. 7. Aortic Atherosclerosis (ICD10-I70.0) and Emphysema (ICD10-J43.9). Electronically Signed   By: Signa Kell M.D.   On: 12/16/2022 12:24   CT CHEST WO CONTRAST  Result Date: 11/28/2022 CLINICAL DATA:  Restaging lung cancer.  * Tracking Code: BO * EXAM: CT CHEST WITHOUT CONTRAST TECHNIQUE: Multidetector CT imaging of the chest was performed following the standard protocol without IV contrast. RADIATION DOSE REDUCTION: This exam was performed according to the departmental dose-optimization program which includes automated exposure control,  adjustment of the mA and/or kV according to patient size and/or use of iterative reconstruction technique. COMPARISON:  Multiple previous imaging studies. The most recent is 05/25/2022 FINDINGS: Cardiovascular: The heart is normal in size. No pericardial effusion. Stable mild tortuosity and calcification of the thoracic aorta. Stable scattered coronary artery calcifications. Mediastinum/Nodes: Slight interval enlargement of 2 adjacent anterior mediastinal nodes. The right-sided node measures 8 mm and the left-sided node measures 7 mm. These measured 5 and 4 mm back in 2022 and have slowly enlarged. There is also a precarinal lymph node on image 53/2 which measures 10 mm and has enlarged since prior studies. New large upper prevascular/anterior mediastinal node on the left side measuring 17 mm. No obvious hilar adenopathy without IV contrast. The esophagus is grossly  normal. There is a small hiatal hernia. Lungs/Pleura: Stable emphysematous changes and areas of pulmonary scarring. Stable post treatment changes involving the superior segment of the left lower lobe with radiation fibrosis but no findings suspicious for recurrent tumor. Stable 6 mm anterior and medial left upper lobe nodule. Stable large cystic/cavitary lesion in the right lower lobe with some medial soft tissue thickening but no interval change. There are 2 small adjacent nodules in the right lower lobe adjacent to the major fissure. The more superior nodule measures 6 x 4 mm on sagittal image 19. This measured 5 x 3 mm previously. Recommend continued observation. The more inferior nodule measures 5 mm and appears stable. New 6 mm nodule at the right lung base on image 123/5. There is also a new small nodule along the lateral edge of a second small cystic lesion in the left lower lobe. This measures 4 mm on image 84/5. No pleural effusions or pleural lesions. Stable bandlike linear scarring changes in the left lower lobe. Upper Abdomen: No significant  upper abdominal findings. No hepatic or adrenal gland lesions. Musculoskeletal: No breast masses, supraclavicular or axillary adenopathy. No findings suspicious for osseous metastatic disease. 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 IMPRESSION: 1. Stable post treatment changes involving the superior segment of the left lower lobe with radiation fibrosis but no findings suspicious for recurrent tumor in this area. 2. Stable large cystic/cavitary lesion in the right lower lobe with some medial soft tissue thickening but no interval change. 6 mm anterior and medial left upper lobe nodule is also stable. 3. Other findings are worrisome for new/progressive metastatic disease, most notably a new large pre-vascular node and slightly enlarging precarinal and anterior mediastinal nodes. There are also 2 new small nodules in the right lower lobe and a slightly enlarging nodule in the right lower lobe along the major fissure. Recommend PET-CT for further evaluation. 4. No findings to suggest osseous metastatic disease or upper abdominal metastatic disease. 5. Stable ununited left sixth and seventh posterior rib fractures likely related to radiation. Aortic Atherosclerosis (ICD10-I70.0) and Emphysema (ICD10-J43.9). Electronically Signed   By: Rudie Meyer M.D.   On: 11/28/2022 09:13    Impression: Stage IIA (T2b, N0, M0) non-small cell lung cancer, adenocarcinoma of the left lower lobe with suspicious right lower lobe cystic lesion  The patient is recovering from the effects of radiation.  ***  Plan:  ***   *** minutes of total time was spent for this patient encounter, including preparation, face-to-face counseling with the patient and coordination of care, physical exam, and documentation of the encounter. ____________________________________  Billie Lade, PhD, MD  This document serves as a record of services personally performed  by Antony Blackbird, MD. It was created on his behalf by Neena Rhymes, a trained medical scribe. The creation of this record is based on the scribe's personal observations and the provider's statements to them. This document has been checked and approved by the attending provider.

## 2022-12-19 ENCOUNTER — Ambulatory Visit
Admission: RE | Admit: 2022-12-19 | Discharge: 2022-12-19 | Disposition: A | Discharge status: Home / Self Care | Payer: 59 | Source: Ambulatory Visit | Attending: Radiation Oncology | Admitting: Radiation Oncology

## 2022-12-19 ENCOUNTER — Encounter: Payer: Self-pay | Admitting: Radiation Oncology

## 2022-12-19 VITALS — BP 130/86 | HR 94 | Temp 97.5°F | Resp 20 | Ht 68.0 in | Wt 175.6 lb

## 2022-12-19 DIAGNOSIS — I10 Essential (primary) hypertension: Secondary | ICD-10-CM | POA: Diagnosis not present

## 2022-12-19 DIAGNOSIS — C7A8 Other malignant neuroendocrine tumors: Secondary | ICD-10-CM | POA: Diagnosis not present

## 2022-12-19 DIAGNOSIS — Z794 Long term (current) use of insulin: Secondary | ICD-10-CM | POA: Diagnosis not present

## 2022-12-19 DIAGNOSIS — C3432 Malignant neoplasm of lower lobe, left bronchus or lung: Secondary | ICD-10-CM | POA: Diagnosis not present

## 2022-12-19 DIAGNOSIS — I471 Supraventricular tachycardia, unspecified: Secondary | ICD-10-CM | POA: Diagnosis not present

## 2022-12-19 DIAGNOSIS — Z79899 Other long term (current) drug therapy: Secondary | ICD-10-CM | POA: Diagnosis not present

## 2022-12-19 DIAGNOSIS — E119 Type 2 diabetes mellitus without complications: Secondary | ICD-10-CM | POA: Diagnosis not present

## 2022-12-19 DIAGNOSIS — C787 Secondary malignant neoplasm of liver and intrahepatic bile duct: Secondary | ICD-10-CM | POA: Diagnosis not present

## 2022-12-19 DIAGNOSIS — C7B8 Other secondary neuroendocrine tumors: Secondary | ICD-10-CM | POA: Diagnosis not present

## 2022-12-19 DIAGNOSIS — Z87891 Personal history of nicotine dependence: Secondary | ICD-10-CM | POA: Diagnosis not present

## 2022-12-26 ENCOUNTER — Telehealth: Payer: Self-pay | Admitting: Family Medicine

## 2022-12-26 DIAGNOSIS — S2242XS Multiple fractures of ribs, left side, sequela: Secondary | ICD-10-CM

## 2022-12-26 MED ORDER — FENTANYL 25 MCG/HR TD PT72
1.0000 | MEDICATED_PATCH | TRANSDERMAL | 0 refills | Status: DC
Start: 2022-12-26 — End: 2023-03-06

## 2022-12-26 NOTE — Telephone Encounter (Signed)
Called patient to follow up on the PET scan findings, rad onc recommendations, and ensure she is connected for the follow up pulmonology appointments and MR brain. No answer, left VM.   Per rad onc Dr. Trina Ao note, PET 4/05 confirms interval progression of disease. He recommends follow-up with Dr. Tonia Brooms for consideration for potential biopsy to confirm recurrence and to help guide additional therapy. Chart shows she has appointment with Dr. Tonia Brooms 4/29 at 8:45 am to discuss possible biopsy.    Dr. Roselind Messier also ordered an MRI of the brain - it has not yet been scheduled per her chart.   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   Fayette Pho, MD

## 2022-12-26 NOTE — Addendum Note (Signed)
Addended by: Valetta Close on: 12/26/2022 01:22 PM   Modules accepted: Orders

## 2022-12-26 NOTE — Telephone Encounter (Signed)
Received message from front desk that patient was returning call to the main line. Returned patient's call.   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 I will reach out to the oncologist to see if alendronate okay with her uncertain lung cancer at this time.   Fayette Pho, MD

## 2022-12-26 NOTE — Telephone Encounter (Signed)
Sent in script for fentanyl patches 25 mcg/hr.   Awaiting word from both pulm and rad onc regarding use of bisphosphonate in setting of concern for lung cancer recurrence. Will send in after both weigh in.   Fayette Pho, MD

## 2022-12-27 MED ORDER — ALENDRONATE SODIUM 70 MG PO TABS
70.0000 mg | ORAL_TABLET | ORAL | 11 refills | Status: DC
Start: 2022-12-27 — End: 2023-03-06

## 2022-12-27 NOTE — Addendum Note (Signed)
Addended by: Valetta Close on: 12/27/2022 12:33 PM   Modules accepted: Orders

## 2022-12-27 NOTE — Telephone Encounter (Signed)
Received confirmation from pulmonology and rad onc that bisphosphonate therapy with alendronate would be acceptable, even given the concern about lung cancer recurrence.   Will rx aldendronate. Already discussed with patient.   Fayette Pho, MD

## 2022-12-30 ENCOUNTER — Telehealth: Payer: Self-pay | Admitting: Family Medicine

## 2022-12-30 DIAGNOSIS — R918 Other nonspecific abnormal finding of lung field: Secondary | ICD-10-CM

## 2022-12-30 DIAGNOSIS — C3432 Malignant neoplasm of lower lobe, left bronchus or lung: Secondary | ICD-10-CM

## 2022-12-30 DIAGNOSIS — G893 Neoplasm related pain (acute) (chronic): Secondary | ICD-10-CM

## 2022-12-30 DIAGNOSIS — R0781 Pleurodynia: Secondary | ICD-10-CM

## 2022-12-30 NOTE — Telephone Encounter (Signed)
Patient called to let Dr Larita Fife know she has been having diarrhea and throwing up. She hasn't been able to start the recent medication. Patient says Larita Fife can call her with any questions or concerns. Patient did not want to be seen sooner than her appointment already scheduled with Larita Fife.

## 2023-01-02 MED ORDER — ONDANSETRON 4 MG PO TBDP: 4.0000 mg | ORAL_TABLET | Freq: Three times a day (TID) | ORAL | 0 refills | Status: DC | PRN

## 2023-01-02 NOTE — Telephone Encounter (Signed)
Returned call, no answer. Left VM.  Sent in refill of zofran, rx zofran odt 4 mg, to help with nausea.   Fayette Pho, MD

## 2023-01-06 ENCOUNTER — Telehealth: Payer: Self-pay | Admitting: Gastroenterology

## 2023-01-06 ENCOUNTER — Ambulatory Visit (INDEPENDENT_AMBULATORY_CARE_PROVIDER_SITE_OTHER): Payer: 59 | Admitting: Family Medicine

## 2023-01-06 ENCOUNTER — Encounter: Payer: Self-pay | Admitting: Family Medicine

## 2023-01-06 VITALS — BP 118/62 | HR 73 | Ht 68.0 in | Wt 172.0 lb

## 2023-01-06 DIAGNOSIS — J441 Chronic obstructive pulmonary disease with (acute) exacerbation: Secondary | ICD-10-CM

## 2023-01-06 MED ORDER — AMOXICILLIN-POT CLAVULANATE 875-125 MG PO TABS
1.0000 | ORAL_TABLET | Freq: Two times a day (BID) | ORAL | 0 refills | Status: AC
Start: 2023-01-06 — End: 2023-01-14

## 2023-01-06 MED ORDER — DEXAMETHASONE SODIUM PHOSPHATE 10 MG/ML IJ SOLN
10.0000 mg | Freq: Once | INTRAMUSCULAR | Status: AC
Start: 2023-01-06 — End: 2023-01-06
  Administered 2023-01-06: 10 mg via INTRAMUSCULAR

## 2023-01-06 MED ORDER — DEXAMETHASONE SODIUM PHOSPHATE 10 MG/ML IJ SOLN
10.0000 mg | Freq: Once | INTRAMUSCULAR | Status: DC
Start: 2023-01-09 — End: 2023-01-09

## 2023-01-06 NOTE — Assessment & Plan Note (Signed)
Will diagnosed with COPD exacerbation given worsening cough and dyspnea with exertion.  No sputum production, cough is dry.  Given patient's historic intolerance to p.o. steroids, will give IM dexamethasone 10 mg today and again on Monday.  Rx Augmentin twice daily x 5 days (Augmentin instead of azithromycin due to risk factors for poor outcome: >2 exacerbations in the past 12 months, comorbid conditions, age).  Patient scheduled with nurse clinic Monday at 10 AM.  Of note, she will see her pulmonologist Dr. Tonia Brooms Monday morning 8:30 AM. I will reach out to pulmonologist regarding COPD optimization, if possible.

## 2023-01-06 NOTE — Telephone Encounter (Signed)
Patient is refusing to do two day prep and would prefer to do one day. Please advise.

## 2023-01-06 NOTE — Telephone Encounter (Signed)
Pt was seen in the office and has prep questions.

## 2023-01-06 NOTE — Progress Notes (Signed)
    SUBJECTIVE:   CHIEF COMPLAINT / HPI:   Respiratory complaint Coughing more, one week duration Dry cough no sputum Worsening dyspnea with exertion Known GOLD stage 3 COPD Hx lung cancer, concern for recurrent cancer on recent surveillance scan Taking Breztri as prescribed, no missed doses Rescue albuterol inhaler TID right now given SOB  Denies fever, chills (aside from menopause hot flashes), decreased intake, difficulty eating or drinking  PERTINENT  PMH / PSH:  Patient Active Problem List   Diagnosis Date Noted   Primary hypertension 11/08/2022   Insufficiency fracture 10/03/2022   COPD exacerbation (HCC) 03/21/2022   Stage 3 severe COPD by GOLD classification (HCC) 03/03/2022   Post radiation rib pain 02/28/2022   Osteopenia after menopause 01/13/2021   Pulmonary emphysema (HCC) 10/07/2020   Primary adenocarcinoma of lower lobe of left lung (HCC) 08/18/2020   Bladder cancer (HCC) 08/18/2020   Chronic hand pain, right 03/26/2019   Chronic bilateral low back pain without sciatica 02/08/2018   HLD, secondary prevention, LDL goal <100 12/14/2017   Insomnia 10/30/2015   Oral herpes 10/19/2015   Arthritis of spine 03/27/2015   Hot flashes, menopausal 02/28/2012   Restless legs 02/28/2012    OBJECTIVE:   BP 118/62   Pulse 73   Ht 5\' 8"  (1.727 m)   Wt 172 lb (78 kg)   SpO2 90%   BMI 26.15 kg/m    General: Awake, alert, NAD although dyspneic when moving up to the exam table Cardiac: Regular rate and rhythm, no murmur Respiratory: Diffuse wheezes in all lung fields, expiratory wheezes louder, dry cough during exam, no referred lung sounds  ASSESSMENT/PLAN:   COPD exacerbation (HCC) Will diagnosed with COPD exacerbation given worsening cough and dyspnea with exertion.  No sputum production, cough is dry.  Given patient's historic intolerance to p.o. steroids, will give IM dexamethasone 10 mg today and again on Monday.  Rx Augmentin twice daily x 5 days (Augmentin  instead of azithromycin due to risk factors for poor outcome: >2 exacerbations in the past 12 months, comorbid conditions, age).  Patient scheduled with nurse clinic Monday at 10 AM.  Of note, she will see her pulmonologist Dr. Tonia Brooms Monday morning 8:30 AM. I will reach out to pulmonologist regarding COPD optimization, if possible.    Fayette Pho, MD [facility]

## 2023-01-06 NOTE — Patient Instructions (Signed)
It was wonderful to see you today. Thank you for allowing me to be a part of your care. Below is a short summary of what we discussed at your visit today:  COPD exacerbation Today you were given a shot of a steroid called dexamethasone in clinic. You will return to clinic on Monday at 10 AM for a nurse only visit to receive your second shot of dexamethasone. START Augmentin antibiotic as prescribed for 5 days.  I will message your pulmonologist Dr. Tonia Brooms to give him a heads up and see if we can think about any other ways to optimize your COPD regimen.   Please bring all of your medications to every appointment!  If you have any questions or concerns, please do not hesitate to contact us via phone or MyChart message.   Fayette Pho, MD

## 2023-01-06 NOTE — Telephone Encounter (Signed)
PT does not want to do a 2 day prep. Please advise.

## 2023-01-09 ENCOUNTER — Ambulatory Visit (INDEPENDENT_AMBULATORY_CARE_PROVIDER_SITE_OTHER): Payer: 59

## 2023-01-09 ENCOUNTER — Encounter: Payer: Self-pay | Admitting: Pulmonary Disease

## 2023-01-09 ENCOUNTER — Ambulatory Visit (INDEPENDENT_AMBULATORY_CARE_PROVIDER_SITE_OTHER): Payer: 59 | Admitting: Pulmonary Disease

## 2023-01-09 VITALS — BP 130/80 | HR 118 | Ht 68.0 in | Wt 174.6 lb

## 2023-01-09 DIAGNOSIS — J441 Chronic obstructive pulmonary disease with (acute) exacerbation: Secondary | ICD-10-CM

## 2023-01-09 DIAGNOSIS — J449 Chronic obstructive pulmonary disease, unspecified: Secondary | ICD-10-CM

## 2023-01-09 DIAGNOSIS — Z87891 Personal history of nicotine dependence: Secondary | ICD-10-CM

## 2023-01-09 DIAGNOSIS — R599 Enlarged lymph nodes, unspecified: Secondary | ICD-10-CM | POA: Diagnosis not present

## 2023-01-09 DIAGNOSIS — C3492 Malignant neoplasm of unspecified part of left bronchus or lung: Secondary | ICD-10-CM

## 2023-01-09 MED ORDER — METHYLPREDNISOLONE SODIUM SUCC 125 MG IJ SOLR
60.0000 mg | Freq: Once | INTRAMUSCULAR | Status: DC
Start: 2023-01-09 — End: 2023-01-09

## 2023-01-09 MED ORDER — METHYLPREDNISOLONE SODIUM SUCC 125 MG IJ SOLR
125.0000 mg | Freq: Once | INTRAMUSCULAR | Status: AC
Start: 2023-01-09 — End: 2023-01-09
  Administered 2023-01-09: 125 mg via INTRAMUSCULAR

## 2023-01-09 NOTE — Patient Instructions (Addendum)
Thank you for visiting Dr. Tonia Brooms at [facility]. Today we recommend the following: Orders Placed This Encounter  Procedures   Bronchoscopy   Procedural/ Surgical Case Request: VIDEO BRONCHOSCOPY WITH ENDOBRONCHIAL ULTRASOUND   O2 needs for DME   Return in about 15 days (around 01/24/2023) for with Kandice Robinsons, NP , after Bronchoscopy.    Please do your part to reduce the spread of COVID-19.

## 2023-01-09 NOTE — Progress Notes (Signed)
Patient presents to nurse clinic for steroid injection for COPD exacerbation. Dr. Larita Fife previously ordered Decadron 10 mg, however, medication was out of stock.   After precepting with Dr. Lum Babe, Dr. Larita Fife provided verbal orders to administer 125 mg Solumedrol. Counseled patient on change in medication therapy. Patient had no additional questions. Administered in [facility]. Site unremarkable, tolerated injection well.   Patient observed for 20 minutes post injection, unable to stay 30 minutes. No signs of adverse reaction noted. Patient states that she "feels fine" prior to leaving. Advised of signs and symptoms to monitor for related to allergic reactions. Patient will return call to clinic with any concerns.   Veronda Prude, RN

## 2023-01-09 NOTE — Telephone Encounter (Signed)
Left VM for patient to return call regarding Colonoscopy and prep.

## 2023-01-09 NOTE — Progress Notes (Signed)
Synopsis: Referred in November 2021 for abnormal CT chest .  By Fayette Pho, MD  Subjective:   PATIENT ID: [name] GENDER: female DOB: May 09, 1956, MRN: [id number]  Chief Complaint  Patient presents with   Follow-up    F/up on lung nodule.    this is a 67 year old female, past medical history of depression, anxiety, current tobacco abuse.  She initially seen by her primary care provider which encouraged her to have a lung cancer screening CT.  She had this completed in October 2021.  The CT scan revealed bilateral lower lobe cystic peripheral based lesions both greater than 4 cm in size concerning for multifocal adenocarcinoma.  Patient has no respiratory symptoms at this time.  She is a current smoker.  She has smoked for 41 years at approximately 1 pack/day.  She presents today in the office with her husband and her daughter via phone.  Long discussion today regarding CT imaging as well as next appropriate steps.  Patient denies fevers chills night sweats weight loss or hemoptysis.  OV 08/28/2020: Patient here today for follow-up after recent bronchoscopy.  Patient has a primary left lower lobe adenocarcinoma and likely has a carcinoma in the left lower lobe however tissue biopsy was inconclusive on this side both are very irregular cystic shaped lesions concerning for multifocal adenocarcinoma.  PET scan was relatively equivocal with him low to moderate SUV uptake.  Patient was referred to thoracic surgery.  Case was discussed with Dr. Cliffton Asters today because she was seen earlier today in the office.  She is also been referred for evaluation of an irregular shaped bladder lesion that was found on PET scan.  Pulmonary function tests have also been completed during this time and PFTs revealed a ratio of 57, a FEV1 of 1.2 L, 57% predicted evidence of air trapping with an RV of 133% and a DLCO of 41.  Today, patient is very anxious.  She met with thoracic surgery today.  Tearful today in the  office about everything that is been going on.  Also worried about the possible bladder lesion that was found on PET scan.  OV 01/20/2021: Patient here today for follow-up regarding complaints of left-sided chest pain.  This happens to be predominantly associated with big deep breaths moving around.  She can pinpoint to the pain that is located just underneath her left bra line and underneath her left breast.  She feels like the skin is tender right there.  She is not really done many things over-the-counter to see if it makes a difference.  But she says it comes on and off occasionally throughout the day.  She has been using her inhaler.  She has not had any recent follow-up with radiation oncology.  She was seen in the emergency department in April for similar complaints of chest pain.  She had a CTA of the chest that was negative for PE.  Also reviewed these images today with patient in the office after her 10 treatments of SBRT to the left lower lobe adenocarcinoma.  The right cystic-appearing lesion appears stable in comparison to previous imaging.  OV 02/15/2021: Here today for follow-up.  Overall from respiratory standpoint doing well using her inhalers.  Had recent follow-up with radiation oncology.  She does have a CT scan planned for October 2022.  I discussed patient's case with radiation oncology a few weeks ago.  She is feeling less anxious.  Overall in a better mood in comparison to where she was  several months ago with new diagnosis and abnormal imaging.  OV 07/21/2021: Here today for follow up regarding. Recently had ct imaging of the chest in September 2022. The right lower lobe lesion that we were following has increased slightly in size. She tolerated the SBRT to the LLL lesion well. Patient denies fevers chils, weight loss. She has quit smoking.   OV 01/19/2022: This is a 67 year old female here today for follow-up.  Recent CT scan of the chest in April shows stability of the right lower lobe  cavity.  History of adenocarcinoma on the left side status post radiation.  Radiation scarring is stable.  From respiratory standpoint she is doing well with no complaints today.  We reviewed her CT scan today in the office.  Also reviewed office note from 12/30/2021 with Dr. Roselind Messier.  OV 03/07/2022: Patient here today for follow-up after recent CT scan of the chest.  Very anxious still about this right lower lobe with cystic lesion.  She still having left-sided rib pain.  She had CT imaging of the chest which shows no fracture.  Also had x-ray imaging.  She is very concerned about this lesion.  We had a long discussion today with her sister today in the office.  OV 05/25/2022: Here today for follow-up after recently being treated for community-acquired pneumonia.  Also since the last time we met I discussed her pain management issues with radiation oncology and her primary care provider.  Recommended consideration for referral to interventional pain.  She was placed on a fentanyl patch.  Pain is better controlled.  She still has daily sputum production.  She is using her Breztri inhaler with as needed albuterol.  OV 01/09/2023: here today for follow up after PET imaging patient was found to have recurrent hypermetabolic lesions within the right hilum and paratracheal region concerning for recurrence of malignancy.  Patient was referred for consideration of repeat bronchoscopy biopsy determine recurrence of disease.  At her baseline she is been having lots of trouble with anxiety.  Today she has another example of this during the middle of our office visit that we were able to kind of talk her down.  It sounds like there is a little portion of vocal cord dysfunction involved in this.      Past Medical History:  Diagnosis Date   Abnormal CT lung screening 07/10/2020   Anxiety    Anxiety state 02/28/2012   GAD7 : 6  PHQ9 : 6   Arthritis    Bladder cancer (HCC) 09/2020   urologist--- dr Berneice Heinrich,  incidental finding on PET scan 12/ 2021,  s/p TURBT 09-18-2020 high grade papillary urothelial    Cancer related pain 03/11/2022   CAP (community acquired pneumonia) 04/20/2022   Centrilobular emphysema (HCC)    Chronic left shoulder pain 04/29/2021   Close exposure to COVID-19 virus 05/03/2019   Constipation 04/09/2019   Costochondritis, acute 08/14/2020   DDD (degenerative disc disease), lumbar    Depression    DOE (dyspnea on exertion)    10-29-2020  per pt can do house work without sob, when walks/ stairs stops frequently  (stated recovers quickly when stops)   Emphysema lung (HCC)    Epidermoid cyst 10/29/2018   Recurrent. Located on left hip at iliac crest 2" anterior to midaxillary line. Removed 2013, 2020, 2022.    Epigastric pain 11/16/2020   Fibroid 2003   GERD (gastroesophageal reflux disease)    History of radiation therapy    Left lung- 10/06/20-10/16/20- Dr.  Antony Blackbird   Leg cramping 11/13/2019   Lumbar spine pain 01/09/2015   Menopausal vaginal dryness 11/13/2019   Multiple closed fractures of ribs of left side 08/20/2022   Nocturnal enuresis 06/23/2020   Chronic overnight "leaking" of unknown source. Likely urine, but reports no color or odor. Volume enough for liners or light pads. Occurs every night.    Primary adenocarcinoma of lower lobe of left lung (HCC) 07/2020   oncologist--- dr Arbutus Ped---  dx 11/ 2021 bilateral lower lobe masses,  s/p bronchoscopy w/ bx's 07-28-2020 malignancy cells on left ;  completed SBRT bilateral lower lung 10-16-2020 5 fractions   Radiation-induced dermatitis    on 10-29-2020 pt complaint stomach and chest areas,  completed SBRT 10-16-2020   Seasonal allergies    Stage 3 severe COPD by GOLD classification [facility])    pulmonologist--- dr Tonia Brooms,  using anoro inhaler daily   Wears glasses      Family History  Problem Relation Age of Onset   Hypertension Mother    Hyperlipidemia Mother    Dementia Father    Lung cancer Father     Parkinson's disease Father    Early death Sister 24       Drug overdose   Throat cancer Brother    Hypertension Daughter    Diabetes Daughter      Past Surgical History:  Procedure Laterality Date   ANTERIOR CERVICAL DECOMP/DISCECTOMY FUSION  03-08-2003 @ [facility]   C3 --- C6   ARTHROTOMY Right 11/09/2015   Procedure: RIGHT WRIST DISTAL RADIAL ULNAR JOINT ARTHROTOMY AND DEBRIDEMENT,  AND ;  Surgeon: Bradly Bienenstock, MD;  [facility];  Service: Orthopedics;  Laterality: Right;   BRONCHIAL BIOPSY  07/28/2020   Procedure: BRONCHIAL BIOPSIES;  Surgeon: Josephine Igo, DO;  Location: [facility] ENDOSCOPY;  Service: Pulmonary;;   BRONCHIAL BIOPSY  08/17/2021   Procedure: BRONCHIAL BIOPSIES;  Surgeon: Josephine Igo, DO;  Location: [facility];  Service: Pulmonary;;   BRONCHIAL BRUSHINGS  07/28/2020   Procedure: BRONCHIAL BRUSHINGS;  Surgeon: Josephine Igo, DO;  Location: [facility];  Service: Pulmonary;;   BRONCHIAL BRUSHINGS  08/17/2021   Procedure: BRONCHIAL BRUSHINGS;  Surgeon: Josephine Igo, DO;  Location: [facility];  Service: Pulmonary;;   BRONCHIAL NEEDLE ASPIRATION BIOPSY  07/28/2020   Procedure: BRONCHIAL NEEDLE ASPIRATION BIOPSIES;  Surgeon: Josephine Igo, DO;  Location: [facility];  Service: Pulmonary;;   BRONCHIAL WASHINGS  07/28/2020   Procedure: BRONCHIAL WASHINGS;  Surgeon: Josephine Igo, DO;  Location: [facility];  Service: Pulmonary;;   BRONCHIAL WASHINGS  08/17/2021   Procedure: BRONCHIAL WASHINGS;  Surgeon: Josephine Igo, DO;  Location: [facility];  Service: Pulmonary;;   COLONOSCOPY     CYSTOSCOPY WITH RETROGRADE PYELOGRAM, URETEROSCOPY AND STENT PLACEMENT N/A 09/18/2020   Procedure: CYSTOSCOPY WITH RETROGRADE PYELOGRAM bilateral,URETEROSCOPY AND  STENT PLACEMENT right ureter;  Surgeon: Sebastian Ache, MD;  Location: [facility];  Service: Urology;  Laterality: N/A;  1 HR   HARDWARE REMOVAL Right 07/24/2013   Procedure: RIGHT WRIST HARDWARE REMOVAL, JOINT RELEASE,  RIGHT HAND MANIPULATION UNDER ANESTHESIA;  Surgeon: Sharma Covert, MD;  Location: [facility];  Service: Orthopedics;  Laterality: Right;   MASS EXCISION Left 10/14/2021   Procedure: EXCISION LEFT BACK CYSTIC MASS;  Surgeon: Sheliah Hatch, De Blanch, MD;  Location: [facility] ;  Service: General;  Laterality: Left;   OPEN REDUCTION INTERNAL FIXATION (ORIF) DISTAL RADIAL FRACTURE Right 11/08/2012   Procedure: OPEN REDUCTION INTERNAL FIXATION (ORIF)  RIGHT DISTAL RADIUS FRACTURE;  Surgeon: Sharma Covert, MD;  Location: [facility];  Service: Orthopedics;  Laterality: Right;   TRANSURETHRAL RESECTION OF BLADDER TUMOR N/A 09/18/2020   Procedure: TRANSURETHRAL RESECTION OF BLADDER TUMOR (TURBT);  Surgeon: Sebastian Ache, MD;  Location: [facility];  Service: Urology;  Laterality: N/A;   TRANSURETHRAL RESECTION OF BLADDER TUMOR N/A 11/04/2020   Procedure: RESTAGING TRANSURETHRAL RESECTION OF BLADDER TUMOR (TURBT); BILATERAL RETROGRADE PYELOGRAM;  Surgeon: Sebastian Ache, MD;  Location: [facility];  Service: Urology;  Laterality: N/A;  1 HR   VIDEO BRONCHOSCOPY WITH ENDOBRONCHIAL NAVIGATION N/A 07/28/2020   Procedure: VIDEO BRONCHOSCOPY WITH ENDOBRONCHIAL NAVIGATION;  Surgeon: Josephine Igo, DO;  Location: [facility];  Service: Pulmonary;  Laterality: N/A;   VIDEO BRONCHOSCOPY WITH RADIAL ENDOBRONCHIAL ULTRASOUND  08/17/2021   Procedure: VIDEO BRONCHOSCOPY WITH RADIAL ENDOBRONCHIAL ULTRASOUND;  Surgeon: Josephine Igo, DO;  Location: [facility];  Service: Pulmonary;;   WRIST ARTHROPLASTY Right 11/09/2015   Procedure: OR DISTAL ULNAR RESECTION ARTHROPLASTY ;  Surgeon: Bradly Bienenstock, MD;  Location: [facility] OR;  Service: Orthopedics;  Laterality: Right;   WRIST ARTHROSCOPY WITH DEBRIDEMENT Right 07/21/2014   Procedure: RIGHT WRIST DISTAL RADIAL ULNAR JOINT DEBRIDEMENT AND JOINT RELEASE POSSIBLE TENDON INTERPOSITION;  Surgeon: Sharma Covert, MD;  Location: [facility] OR;   Service: Orthopedics;  Laterality: Right;    Social History   Socioeconomic History   Marital status: Married    Spouse name: Sherilyn Cooter Munshi   Number of children: 2   Years of education: 12   Highest education level: Not on file  Occupational History   Occupation: Unemployed     Employer:  [employer]    Comment: Sweep   Tobacco Use   Smoking status: Former    Packs/day: 1.00    Years: 50.00    Additional pack years: 0.00    Total pack years: 50.00    Types: Cigarettes    Start date: 04/13/1975    Quit date: 07/14/2020    Years since quitting: 2.4    Passive exposure: Past   Smokeless tobacco: Never  Vaping Use   Vaping Use: Never used  Substance and Sexual Activity   Alcohol use: Yes    Comment: Holidays only   Drug use: Never   Sexual activity: Not Currently    Partners: Male    Birth control/protection: Post-menopausal  Other Topics Concern   Not on file  Social History Narrative   Patient lives alone currently in [place].    Patient is married, they are living separately at this time.    Patient enjoys watching tv, cooking, and spending time with her children and grandchildren.    Social Determinants of Health   Financial Resource Strain: Low Risk  (12/13/2022)   Overall Financial Resource Strain (CARDIA)    Difficulty of Paying Living Expenses: Not hard at all  Food Insecurity: No Food Insecurity (12/13/2022)   Hunger Vital Sign    Worried About Running Out of Food in the Last Year: Never true    Ran Out of Food in the Last Year: Never true  Transportation Needs: No Transportation Needs (12/13/2022)   PRAPARE - Administrator, Civil Service (Medical): No    Lack of Transportation (Non-Medical): No  Physical Activity: Inactive (12/13/2022)   Exercise Vital Sign    Days of Exercise per Week: 0 days    Minutes of Exercise per Session: 0 min  Stress: No Stress Concern Present (12/13/2022)  [employer] of Occupational Health - Occupational Stress  Questionnaire    Feeling of Stress : Not at all  Social Connections: Moderately Integrated (12/13/2022)   Social Connection and Isolation Panel [NHANES]    Frequency of Communication with Friends and Family: More than three times a week    Frequency of Social Gatherings with Friends and Family: More than three times a week    Attends Religious Services: More than 4 times per year    Active Member of [employer] or Organizations: Yes    Attends Banker Meetings: More than 4 times per year    Marital Status: Separated  Intimate Partner Violence: Not At Risk (12/13/2022)   Humiliation, Afraid, Rape, and Kick questionnaire    Fear of Current or Ex-Partner: No    Emotionally Abused: No    Physically Abused: No    Sexually Abused: No     Allergies  Allergen Reactions   Codeine Nausea And Vomiting and Other (See Comments)    Hallucinations   Prednisone Other (See Comments)    'Made me feel funny inside my head"-pt cannot be specific     Outpatient Medications Prior to Visit  Medication Sig Dispense Refill   albuterol (VENTOLIN HFA) 108 (90 Base) MCG/ACT inhaler USE 2 INHALATIONS BY MOUTH  INTO THE LUNGS EVERY 6  HOURS AS NEEDED FOR  SHORTNESS OF BREATH OR  WHEEZING 26.8 g 6   alendronate (FOSAMAX) 70 MG tablet Take 1 tablet (70 mg total) by mouth every 7 (seven) days. Take with a full glass of water on an empty stomach. 4 tablet 11   amLODipine (NORVASC) 10 MG tablet Take 1 tablet (10 mg total) by mouth daily. 30 tablet 3   amoxicillin-clavulanate (AUGMENTIN) 875-125 MG tablet Take 1 tablet by mouth 2 (two) times daily for 5 days. 10 tablet 0   atorvastatin (LIPITOR) 40 MG tablet TAKE 1 TABLET BY MOUTH AT  BEDTIME 100 tablet 2   Budeson-Glycopyrrol-Formoterol (BREZTRI AEROSPHERE) 160-9-4.8 MCG/ACT AERO Inhale 2 puffs into the lungs in the morning and at bedtime. 21.4 g 13   calcium-vitamin D (OSCAL WITH D) 500-5 MG-MCG tablet Take 1 tablet by mouth daily with breakfast. 30 tablet 3    Cholecalciferol (VITAMIN D) 50 MCG (2000 UT) tablet Take 1 tablet (2,000 Units total) by mouth daily. 90 tablet 3   citalopram (CELEXA) 10 MG tablet Take 1 tablet (10 mg total) by mouth daily. Follow up in 3-4 weeks for side effect check. 60 tablet 1   diclofenac Sodium (VOLTAREN) 1 % GEL Apply 2 g topically 4 (four) times daily. Apply to painful area of ribs. Need to use regularly for relief. 100 g 2   fentaNYL (DURAGESIC) 25 MCG/HR Place 1 patch onto the skin every 3 (three) days. 10 patch 0   fluticasone (FLONASE) 50 MCG/ACT nasal spray SHAKE LIQUID AND USE 2 SPRAYS IN EACH NOSTRIL DAILY 16 g 6   gabapentin (NEURONTIN) 600 MG tablet Take 1 tablet (600 mg total) by mouth 3 (three) times daily. 270 tablet 1   hydrOXYzine (ATARAX) 50 MG tablet Take 2 tablets (100 mg total) by mouth at bedtime as needed for anxiety or itching (sleep). 60 tablet 2   ibuprofen (ADVIL) 800 MG tablet Take 1 tablet (800 mg total) by mouth every 8 (eight) hours as needed. 30 tablet 0   lidocaine (LIDODERM) 5 % Place 1 patch onto the skin daily. Remove & Discard patch within 12 hours or as directed by MD 30  patch 0   MELATONIN GUMMIES PO Take 10 mg by mouth at bedtime.     naloxone (NARCAN) nasal spray 4 mg/0.1 mL Use for suspected opioid overdose - too sleepy to wake up, difficulty breathing 2 each 4   ondansetron (ZOFRAN) 4 MG tablet Take 1 tablet (4 mg total) by mouth every 8 (eight) hours as needed for nausea or vomiting. Take one tablet before starting each prep. 3 tablet 0   ondansetron (ZOFRAN-ODT) 4 MG disintegrating tablet Take 1 tablet (4 mg total) by mouth every 8 (eight) hours as needed for nausea or vomiting. 20 tablet 0   pantoprazole (PROTONIX) 40 MG tablet Take 1 tablet (40 mg total) by mouth daily as needed (acid indigestion/heartburn.). 30 tablet 2   polyethylene glycol powder (GLYCOLAX/MIRALAX) 17 GM/SCOOP powder Take 17 g by mouth 2 (two) times daily as needed. 3350 g 1   senna (SENOKOT) 8.6 MG TABS tablet  Take 1 tablet (8.6 mg total) by mouth at bedtime. 120 tablet 0   triamcinolone cream (KENALOG) 0.1 % Apply 1 Application topically 2 (two) times daily. 60 g 2   baclofen (LIORESAL) 10 MG tablet Take 0.5 tablets (5 mg total) by mouth 3 (three) times daily. (Patient not taking: Reported on 11/28/2022) 30 each 0   Facility-Administered Medications Prior to Visit  Medication Dose Route Frequency Provider Last Rate Last Admin   dexamethasone (DECADRON) injection 10 mg  10 mg Intramuscular Once Fayette Pho, MD        Review of Systems  Constitutional:  Negative for chills, fever, malaise/fatigue and weight loss.  HENT:  Negative for hearing loss, sore throat and tinnitus.   Eyes:  Negative for blurred vision and double vision.  Respiratory:  Positive for shortness of breath. Negative for cough, hemoptysis, sputum production, wheezing and stridor.   Cardiovascular:  Negative for chest pain, palpitations, orthopnea, leg swelling and PND.  Gastrointestinal:  Negative for abdominal pain, constipation, diarrhea, heartburn, nausea and vomiting.  Genitourinary:  Negative for dysuria, hematuria and urgency.  Musculoskeletal:  Negative for joint pain and myalgias.  Skin:  Negative for itching and rash.  Neurological:  Negative for dizziness, tingling, weakness and headaches.  Endo/Heme/Allergies:  Negative for environmental allergies. Does not bruise/bleed easily.  Psychiatric/Behavioral:  Negative for depression. The patient is nervous/anxious. The patient does not have insomnia.   All other systems reviewed and are negative.    Objective:  Physical Exam Vitals reviewed.  Constitutional:      General: She is not in acute distress.    Appearance: She is well-developed.  HENT:     Head: Normocephalic and atraumatic.     Mouth/Throat:     Pharynx: No oropharyngeal exudate.  Eyes:     Conjunctiva/sclera: Conjunctivae normal.     Pupils: Pupils are equal, round, and reactive to light.  Neck:      Vascular: No JVD.     Trachea: No tracheal deviation.     Comments: Loss of supraclavicular fat Cardiovascular:     Rate and Rhythm: Normal rate and regular rhythm.     Heart sounds: S1 normal and S2 normal.     Comments: Distant heart tones Pulmonary:     Effort: No tachypnea or accessory muscle usage.     Breath sounds: No stridor. Decreased breath sounds (throughout all lung fields) present. No wheezing, rhonchi or rales.     Comments: Severely diminished breath sounds bilaterally Abdominal:     General: Bowel sounds are normal. There is no  distension.     Palpations: Abdomen is soft.     Tenderness: There is no abdominal tenderness.  Musculoskeletal:        General: Deformity (muscle wasting ) present.  Skin:    General: Skin is warm and dry.     Capillary Refill: Capillary refill takes less than 2 seconds.     Findings: No rash.  Neurological:     Mental Status: She is alert and oriented to person, place, and time.  Psychiatric:        Behavior: Behavior normal.      Vitals:   01/09/23 0848  BP: 130/80  Pulse: (!) 118  SpO2: 92%  Weight: 174 lb 9.6 oz (79.2 kg)  Height: 5\' 8"  (1.727 m)   92% on RA BMI Readings from Last 3 Encounters:  01/09/23 26.55 kg/m  01/06/23 26.15 kg/m  12/19/22 26.70 kg/m   Wt Readings from Last 3 Encounters:  01/09/23 174 lb 9.6 oz (79.2 kg)  01/06/23 172 lb (78 kg)  12/19/22 175 lb 9.6 oz (79.7 kg)     CBC    Component Value Date/Time   WBC 8.6 10/20/2022 1751   RBC 4.64 10/20/2022 1751   HGB 12.6 10/20/2022 1751   HGB 13.8 10/03/2022 1528   HCT 41.2 10/20/2022 1751   HCT 43.3 10/03/2022 1528   PLT 350 10/20/2022 1751   PLT 413 10/03/2022 1528   MCV 88.8 10/20/2022 1751   MCV 85 10/03/2022 1528   MCH 27.2 10/20/2022 1751   MCHC 30.6 10/20/2022 1751   RDW 14.4 10/20/2022 1751   RDW 13.5 10/03/2022 1528   LYMPHSABS 1.3 12/17/2020 1029   LYMPHSABS 2.2 06/23/2020 1237   MONOABS 0.7 12/17/2020 1029   EOSABS 0.2  12/17/2020 1029   EOSABS 0.2 06/23/2020 1237   BASOSABS 0.1 12/17/2020 1029   BASOSABS 0.1 06/23/2020 1237     Chest Imaging:  Lung cancer screening CT October 2021: Bilateral cystic appearing masses concerning for multifocal adenocarcinoma. The patient's images have been independently reviewed by me.    CT chest 12/17/2020: Stable cystic lesion within the right base, left lower lobe cystic subsolid lesion consistent with previous adenocarcinoma is stable.  Also has associated moderate emphysema.. The patient's images have been independently reviewed by me.    CT Chest September 2022: RLL cystic lesion. Slightly larger in size  The patient's images have been independently reviewed by me.    CT chest June 2023: Right lower lobe cystic lesion relatively stable in size Scarring from the left lower lobe and some pleural thickening. Small pulmonary nodule in the right side. The patient's images have been independently reviewed by me.    April 2024: Nuclear medicine pet imaging Hypermetabolic uptake, right hilum and paratracheal nodes concerning for recurrence of disease. The patient's images have been independently reviewed by me.     Pulmonary Functions Testing Results:    Latest Ref Rng & Units 08/03/2020    9:11 AM  PFT Results  FVC-Pre L 2.03   FVC-Predicted Pre % 67   FVC-Post L 2.15   FVC-Predicted Post % 71   Pre FEV1/FVC % % 67   Post FEV1/FCV % % 57   FEV1-Pre L 1.37   FEV1-Predicted Pre % 58   FEV1-Post L 1.22   DLCO uncorrected ml/min/mmHg 9.46   DLCO UNC% % 41   DLCO corrected ml/min/mmHg 9.26   DLCO COR %Predicted % 40   DLVA Predicted % 57   TLC L 5.13  TLC % Predicted % 90   RV % Predicted % 133     FeNO:   Pathology:   Echocardiogram:   Heart Catheterization:     Assessment & Plan:     ICD-10-CM   1. Adenopathy  R59.9 Bronchoscopy    Procedural/ Surgical Case Request: VIDEO BRONCHOSCOPY WITH ENDOBRONCHIAL ULTRASOUND    2. Stage 2  moderate COPD by GOLD classification (HCC)  J44.9     3. Former smoker  Z87.891     4. Adenocarcinoma, lung, left (HCC)  C34.92        Discussion:  This is a 67 year old female, severe COPD, centrilobular emphysema, left lower lobe adenocarcinoma status postradiation therapy, right lower lobe multicystic large area that has been relatively stable with continued surveillance imaging.  Now has a nuclear medicine PET scan with recurrence of disease in the right hilum and adenopathy within the peritracheal region.  Plan: Today in the office talked risk-benefit alternatives of proceeding with videobronchoscopy endobronchial ultrasound transbronchial needle aspiration. Patient is agreeable to proceed. We talked about the risk of bleeding and pneumothorax. She is hypoxemic today in the office not on oxygen.  Were going to start her on 2 L nasal cannula. This was during one of her anxiety provoking attacks which I think VCD plays a component in. We were able to talk her down and now she is breathing better sats are fine. We will get orders for 2 L nasal cannula home O2 supplies.  Return to clinic 1 week after bronc with SG, NP.    Current Outpatient Medications:    albuterol (VENTOLIN HFA) 108 (90 Base) MCG/ACT inhaler, USE 2 INHALATIONS BY MOUTH  INTO THE LUNGS EVERY 6  HOURS AS NEEDED FOR  SHORTNESS OF BREATH OR  WHEEZING, Disp: 26.8 g, Rfl: 6   alendronate (FOSAMAX) 70 MG tablet, Take 1 tablet (70 mg total) by mouth every 7 (seven) days. Take with a full glass of water on an empty stomach., Disp: 4 tablet, Rfl: 11   amLODipine (NORVASC) 10 MG tablet, Take 1 tablet (10 mg total) by mouth daily., Disp: 30 tablet, Rfl: 3   amoxicillin-clavulanate (AUGMENTIN) 875-125 MG tablet, Take 1 tablet by mouth 2 (two) times daily for 5 days., Disp: 10 tablet, Rfl: 0   atorvastatin (LIPITOR) 40 MG tablet, TAKE 1 TABLET BY MOUTH AT  BEDTIME, Disp: 100 tablet, Rfl: 2   Budeson-Glycopyrrol-Formoterol  (BREZTRI AEROSPHERE) 160-9-4.8 MCG/ACT AERO, Inhale 2 puffs into the lungs in the morning and at bedtime., Disp: 21.4 g, Rfl: 13   calcium-vitamin D (OSCAL WITH D) 500-5 MG-MCG tablet, Take 1 tablet by mouth daily with breakfast., Disp: 30 tablet, Rfl: 3   Cholecalciferol (VITAMIN D) 50 MCG (2000 UT) tablet, Take 1 tablet (2,000 Units total) by mouth daily., Disp: 90 tablet, Rfl: 3   citalopram (CELEXA) 10 MG tablet, Take 1 tablet (10 mg total) by mouth daily. Follow up in 3-4 weeks for side effect check., Disp: 60 tablet, Rfl: 1   diclofenac Sodium (VOLTAREN) 1 % GEL, Apply 2 g topically 4 (four) times daily. Apply to painful area of ribs. Need to use regularly for relief., Disp: 100 g, Rfl: 2   fentaNYL (DURAGESIC) 25 MCG/HR, Place 1 patch onto the skin every 3 (three) days., Disp: 10 patch, Rfl: 0   fluticasone (FLONASE) 50 MCG/ACT nasal spray, SHAKE LIQUID AND USE 2 SPRAYS IN EACH NOSTRIL DAILY, Disp: 16 g, Rfl: 6   gabapentin (NEURONTIN) 600 MG tablet, Take 1 tablet (600  mg total) by mouth 3 (three) times daily., Disp: 270 tablet, Rfl: 1   hydrOXYzine (ATARAX) 50 MG tablet, Take 2 tablets (100 mg total) by mouth at bedtime as needed for anxiety or itching (sleep)., Disp: 60 tablet, Rfl: 2   ibuprofen (ADVIL) 800 MG tablet, Take 1 tablet (800 mg total) by mouth every 8 (eight) hours as needed., Disp: 30 tablet, Rfl: 0   lidocaine (LIDODERM) 5 %, Place 1 patch onto the skin daily. Remove & Discard patch within 12 hours or as directed by MD, Disp: 30 patch, Rfl: 0   MELATONIN GUMMIES PO, Take 10 mg by mouth at bedtime., Disp: , Rfl:    naloxone (NARCAN) nasal spray 4 mg/0.1 mL, Use for suspected opioid overdose - too sleepy to wake up, difficulty breathing, Disp: 2 each, Rfl: 4   ondansetron (ZOFRAN) 4 MG tablet, Take 1 tablet (4 mg total) by mouth every 8 (eight) hours as needed for nausea or vomiting. Take one tablet before starting each prep., Disp: 3 tablet, Rfl: 0   ondansetron (ZOFRAN-ODT) 4 MG  disintegrating tablet, Take 1 tablet (4 mg total) by mouth every 8 (eight) hours as needed for nausea or vomiting., Disp: 20 tablet, Rfl: 0   pantoprazole (PROTONIX) 40 MG tablet, Take 1 tablet (40 mg total) by mouth daily as needed (acid indigestion/heartburn.)., Disp: 30 tablet, Rfl: 2   polyethylene glycol powder (GLYCOLAX/MIRALAX) 17 GM/SCOOP powder, Take 17 g by mouth 2 (two) times daily as needed., Disp: 3350 g, Rfl: 1   senna (SENOKOT) 8.6 MG TABS tablet, Take 1 tablet (8.6 mg total) by mouth at bedtime., Disp: 120 tablet, Rfl: 0   triamcinolone cream (KENALOG) 0.1 %, Apply 1 Application topically 2 (two) times daily., Disp: 60 g, Rfl: 2   baclofen (LIORESAL) 10 MG tablet, Take 0.5 tablets (5 mg total) by mouth 3 (three) times daily. (Patient not taking: Reported on 11/28/2022), Disp: 30 each, Rfl: 0  Current Facility-Administered Medications:    dexamethasone (DECADRON) injection 10 mg, 10 mg, Intramuscular, Once, Fayette Pho, MD    Josephine Igo, DO [facility] 01/09/2023 9:21 AM

## 2023-01-09 NOTE — Addendum Note (Signed)
Addended by: Valetta Close on: 01/09/2023 10:45 AM   Modules accepted: Orders

## 2023-01-10 DIAGNOSIS — C3492 Malignant neoplasm of unspecified part of left bronchus or lung: Secondary | ICD-10-CM | POA: Diagnosis not present

## 2023-01-11 ENCOUNTER — Emergency Department (HOSPITAL_COMMUNITY): Payer: 59

## 2023-01-11 ENCOUNTER — Encounter (HOSPITAL_COMMUNITY): Payer: Self-pay | Admitting: *Deleted

## 2023-01-11 ENCOUNTER — Telehealth: Payer: Self-pay | Admitting: Acute Care

## 2023-01-11 ENCOUNTER — Other Ambulatory Visit: Payer: Self-pay

## 2023-01-11 ENCOUNTER — Inpatient Hospital Stay (HOSPITAL_COMMUNITY)
Admission: EM | Admit: 2023-01-11 | Discharge: 2023-03-13 | DRG: 004 | Disposition: E | Payer: 59 | Attending: Pulmonary Disease | Admitting: Pulmonary Disease

## 2023-01-11 DIAGNOSIS — R739 Hyperglycemia, unspecified: Secondary | ICD-10-CM | POA: Diagnosis not present

## 2023-01-11 DIAGNOSIS — Z7901 Long term (current) use of anticoagulants: Secondary | ICD-10-CM

## 2023-01-11 DIAGNOSIS — Z808 Family history of malignant neoplasm of other organs or systems: Secondary | ICD-10-CM

## 2023-01-11 DIAGNOSIS — I4891 Unspecified atrial fibrillation: Secondary | ICD-10-CM | POA: Insufficient documentation

## 2023-01-11 DIAGNOSIS — I959 Hypotension, unspecified: Secondary | ICD-10-CM | POA: Diagnosis not present

## 2023-01-11 DIAGNOSIS — E781 Pure hyperglyceridemia: Secondary | ICD-10-CM | POA: Diagnosis present

## 2023-01-11 DIAGNOSIS — I952 Hypotension due to drugs: Secondary | ICD-10-CM | POA: Diagnosis not present

## 2023-01-11 DIAGNOSIS — Z6831 Body mass index (BMI) 31.0-31.9, adult: Secondary | ICD-10-CM

## 2023-01-11 DIAGNOSIS — Z981 Arthrodesis status: Secondary | ICD-10-CM

## 2023-01-11 DIAGNOSIS — J189 Pneumonia, unspecified organism: Secondary | ICD-10-CM | POA: Diagnosis not present

## 2023-01-11 DIAGNOSIS — Z6828 Body mass index (BMI) 28.0-28.9, adult: Secondary | ICD-10-CM

## 2023-01-11 DIAGNOSIS — K859 Acute pancreatitis without necrosis or infection, unspecified: Secondary | ICD-10-CM | POA: Diagnosis not present

## 2023-01-11 DIAGNOSIS — Z930 Tracheostomy status: Secondary | ICD-10-CM

## 2023-01-11 DIAGNOSIS — C3431 Malignant neoplasm of lower lobe, right bronchus or lung: Secondary | ICD-10-CM | POA: Diagnosis not present

## 2023-01-11 DIAGNOSIS — C3432 Malignant neoplasm of lower lobe, left bronchus or lung: Secondary | ICD-10-CM | POA: Diagnosis present

## 2023-01-11 DIAGNOSIS — Y842 Radiological procedure and radiotherapy as the cause of abnormal reaction of the patient, or of later complication, without mention of misadventure at the time of the procedure: Secondary | ICD-10-CM | POA: Diagnosis present

## 2023-01-11 DIAGNOSIS — I700 Atherosclerosis of aorta: Secondary | ICD-10-CM | POA: Diagnosis not present

## 2023-01-11 DIAGNOSIS — G893 Neoplasm related pain (acute) (chronic): Secondary | ICD-10-CM | POA: Diagnosis present

## 2023-01-11 DIAGNOSIS — C3401 Malignant neoplasm of right main bronchus: Secondary | ICD-10-CM | POA: Diagnosis not present

## 2023-01-11 DIAGNOSIS — Z66 Do not resuscitate: Secondary | ICD-10-CM | POA: Insufficient documentation

## 2023-01-11 DIAGNOSIS — Z1152 Encounter for screening for COVID-19: Secondary | ICD-10-CM

## 2023-01-11 DIAGNOSIS — Z83438 Family history of other disorder of lipoprotein metabolism and other lipidemia: Secondary | ICD-10-CM

## 2023-01-11 DIAGNOSIS — Z801 Family history of malignant neoplasm of trachea, bronchus and lung: Secondary | ICD-10-CM

## 2023-01-11 DIAGNOSIS — R5381 Other malaise: Secondary | ICD-10-CM | POA: Diagnosis not present

## 2023-01-11 DIAGNOSIS — M5136 Other intervertebral disc degeneration, lumbar region: Secondary | ICD-10-CM | POA: Diagnosis present

## 2023-01-11 DIAGNOSIS — Z96631 Presence of right artificial wrist joint: Secondary | ICD-10-CM | POA: Diagnosis present

## 2023-01-11 DIAGNOSIS — E861 Hypovolemia: Secondary | ICD-10-CM | POA: Diagnosis present

## 2023-01-11 DIAGNOSIS — Z9911 Dependence on respirator [ventilator] status: Secondary | ICD-10-CM

## 2023-01-11 DIAGNOSIS — Z7983 Long term (current) use of bisphosphonates: Secondary | ICD-10-CM

## 2023-01-11 DIAGNOSIS — E876 Hypokalemia: Secondary | ICD-10-CM | POA: Diagnosis present

## 2023-01-11 DIAGNOSIS — I10 Essential (primary) hypertension: Secondary | ICD-10-CM | POA: Diagnosis present

## 2023-01-11 DIAGNOSIS — F32A Depression, unspecified: Secondary | ICD-10-CM | POA: Diagnosis present

## 2023-01-11 DIAGNOSIS — I2489 Other forms of acute ischemic heart disease: Secondary | ICD-10-CM | POA: Diagnosis present

## 2023-01-11 DIAGNOSIS — Z515 Encounter for palliative care: Secondary | ICD-10-CM | POA: Diagnosis not present

## 2023-01-11 DIAGNOSIS — Z87891 Personal history of nicotine dependence: Secondary | ICD-10-CM

## 2023-01-11 DIAGNOSIS — Z8249 Family history of ischemic heart disease and other diseases of the circulatory system: Secondary | ICD-10-CM

## 2023-01-11 DIAGNOSIS — J441 Chronic obstructive pulmonary disease with (acute) exacerbation: Secondary | ICD-10-CM | POA: Diagnosis present

## 2023-01-11 DIAGNOSIS — R338 Other retention of urine: Secondary | ICD-10-CM | POA: Diagnosis not present

## 2023-01-11 DIAGNOSIS — A419 Sepsis, unspecified organism: Secondary | ICD-10-CM | POA: Clinically undetermined

## 2023-01-11 DIAGNOSIS — J9809 Other diseases of bronchus, not elsewhere classified: Secondary | ICD-10-CM | POA: Diagnosis not present

## 2023-01-11 DIAGNOSIS — J432 Centrilobular emphysema: Secondary | ICD-10-CM | POA: Diagnosis present

## 2023-01-11 DIAGNOSIS — R0603 Acute respiratory distress: Secondary | ICD-10-CM | POA: Diagnosis present

## 2023-01-11 DIAGNOSIS — J383 Other diseases of vocal cords: Secondary | ICD-10-CM

## 2023-01-11 DIAGNOSIS — E43 Unspecified severe protein-calorie malnutrition: Secondary | ICD-10-CM | POA: Diagnosis not present

## 2023-01-11 DIAGNOSIS — Z4682 Encounter for fitting and adjustment of non-vascular catheter: Secondary | ICD-10-CM | POA: Diagnosis not present

## 2023-01-11 DIAGNOSIS — R6521 Severe sepsis with septic shock: Secondary | ICD-10-CM | POA: Diagnosis present

## 2023-01-11 DIAGNOSIS — T4275XA Adverse effect of unspecified antiepileptic and sedative-hypnotic drugs, initial encounter: Secondary | ICD-10-CM | POA: Diagnosis not present

## 2023-01-11 DIAGNOSIS — R1310 Dysphagia, unspecified: Secondary | ICD-10-CM | POA: Diagnosis not present

## 2023-01-11 DIAGNOSIS — C3491 Malignant neoplasm of unspecified part of right bronchus or lung: Secondary | ICD-10-CM | POA: Diagnosis not present

## 2023-01-11 DIAGNOSIS — I480 Paroxysmal atrial fibrillation: Secondary | ICD-10-CM | POA: Diagnosis present

## 2023-01-11 DIAGNOSIS — J9611 Chronic respiratory failure with hypoxia: Secondary | ICD-10-CM | POA: Diagnosis not present

## 2023-01-11 DIAGNOSIS — K219 Gastro-esophageal reflux disease without esophagitis: Secondary | ICD-10-CM | POA: Diagnosis present

## 2023-01-11 DIAGNOSIS — R918 Other nonspecific abnormal finding of lung field: Secondary | ICD-10-CM | POA: Diagnosis not present

## 2023-01-11 DIAGNOSIS — C799 Secondary malignant neoplasm of unspecified site: Secondary | ICD-10-CM | POA: Diagnosis not present

## 2023-01-11 DIAGNOSIS — Z79899 Other long term (current) drug therapy: Secondary | ICD-10-CM

## 2023-01-11 DIAGNOSIS — Z818 Family history of other mental and behavioral disorders: Secondary | ICD-10-CM

## 2023-01-11 DIAGNOSIS — S2242XG Multiple fractures of ribs, left side, subsequent encounter for fracture with delayed healing: Secondary | ICD-10-CM

## 2023-01-11 DIAGNOSIS — Z781 Physical restraint status: Secondary | ICD-10-CM

## 2023-01-11 DIAGNOSIS — Z538 Procedure and treatment not carried out for other reasons: Secondary | ICD-10-CM | POA: Diagnosis not present

## 2023-01-11 DIAGNOSIS — K649 Unspecified hemorrhoids: Secondary | ICD-10-CM | POA: Diagnosis present

## 2023-01-11 DIAGNOSIS — R0602 Shortness of breath: Secondary | ICD-10-CM | POA: Diagnosis not present

## 2023-01-11 DIAGNOSIS — Z885 Allergy status to narcotic agent status: Secondary | ICD-10-CM

## 2023-01-11 DIAGNOSIS — R590 Localized enlarged lymph nodes: Secondary | ICD-10-CM | POA: Diagnosis not present

## 2023-01-11 DIAGNOSIS — K567 Ileus, unspecified: Secondary | ICD-10-CM | POA: Diagnosis not present

## 2023-01-11 DIAGNOSIS — Y848 Other medical procedures as the cause of abnormal reaction of the patient, or of later complication, without mention of misadventure at the time of the procedure: Secondary | ICD-10-CM | POA: Diagnosis not present

## 2023-01-11 DIAGNOSIS — E871 Hypo-osmolality and hyponatremia: Secondary | ICD-10-CM | POA: Diagnosis present

## 2023-01-11 DIAGNOSIS — D649 Anemia, unspecified: Secondary | ICD-10-CM | POA: Diagnosis not present

## 2023-01-11 DIAGNOSIS — G928 Other toxic encephalopathy: Secondary | ICD-10-CM | POA: Diagnosis not present

## 2023-01-11 DIAGNOSIS — F419 Anxiety disorder, unspecified: Secondary | ICD-10-CM | POA: Diagnosis not present

## 2023-01-11 DIAGNOSIS — K6389 Other specified diseases of intestine: Secondary | ICD-10-CM | POA: Diagnosis not present

## 2023-01-11 DIAGNOSIS — C3490 Malignant neoplasm of unspecified part of unspecified bronchus or lung: Secondary | ICD-10-CM | POA: Diagnosis not present

## 2023-01-11 DIAGNOSIS — J9602 Acute respiratory failure with hypercapnia: Secondary | ICD-10-CM | POA: Diagnosis not present

## 2023-01-11 DIAGNOSIS — J9612 Chronic respiratory failure with hypercapnia: Secondary | ICD-10-CM | POA: Diagnosis present

## 2023-01-11 DIAGNOSIS — I3139 Other pericardial effusion (noninflammatory): Secondary | ICD-10-CM | POA: Diagnosis present

## 2023-01-11 DIAGNOSIS — J988 Other specified respiratory disorders: Secondary | ICD-10-CM | POA: Diagnosis present

## 2023-01-11 DIAGNOSIS — R159 Full incontinence of feces: Secondary | ICD-10-CM | POA: Diagnosis not present

## 2023-01-11 DIAGNOSIS — J9601 Acute respiratory failure with hypoxia: Secondary | ICD-10-CM | POA: Diagnosis not present

## 2023-01-11 DIAGNOSIS — J440 Chronic obstructive pulmonary disease with acute lower respiratory infection: Secondary | ICD-10-CM | POA: Diagnosis present

## 2023-01-11 DIAGNOSIS — R627 Adult failure to thrive: Secondary | ICD-10-CM | POA: Diagnosis present

## 2023-01-11 DIAGNOSIS — D638 Anemia in other chronic diseases classified elsewhere: Secondary | ICD-10-CM | POA: Diagnosis not present

## 2023-01-11 DIAGNOSIS — J9690 Respiratory failure, unspecified, unspecified whether with hypoxia or hypercapnia: Secondary | ICD-10-CM | POA: Diagnosis present

## 2023-01-11 DIAGNOSIS — J439 Emphysema, unspecified: Secondary | ICD-10-CM | POA: Diagnosis not present

## 2023-01-11 DIAGNOSIS — D630 Anemia in neoplastic disease: Secondary | ICD-10-CM | POA: Diagnosis present

## 2023-01-11 DIAGNOSIS — Z888 Allergy status to other drugs, medicaments and biological substances status: Secondary | ICD-10-CM

## 2023-01-11 DIAGNOSIS — Z7951 Long term (current) use of inhaled steroids: Secondary | ICD-10-CM

## 2023-01-11 DIAGNOSIS — N1330 Unspecified hydronephrosis: Secondary | ICD-10-CM | POA: Diagnosis not present

## 2023-01-11 DIAGNOSIS — J384 Edema of larynx: Secondary | ICD-10-CM | POA: Diagnosis not present

## 2023-01-11 DIAGNOSIS — Z923 Personal history of irradiation: Secondary | ICD-10-CM

## 2023-01-11 DIAGNOSIS — R599 Enlarged lymph nodes, unspecified: Secondary | ICD-10-CM | POA: Insufficient documentation

## 2023-01-11 DIAGNOSIS — R4589 Other symptoms and signs involving emotional state: Secondary | ICD-10-CM | POA: Diagnosis not present

## 2023-01-11 DIAGNOSIS — F1120 Opioid dependence, uncomplicated: Secondary | ICD-10-CM | POA: Diagnosis present

## 2023-01-11 DIAGNOSIS — G709 Myoneural disorder, unspecified: Secondary | ICD-10-CM | POA: Diagnosis present

## 2023-01-11 DIAGNOSIS — Z7189 Other specified counseling: Secondary | ICD-10-CM | POA: Diagnosis not present

## 2023-01-11 DIAGNOSIS — F410 Panic disorder [episodic paroxysmal anxiety] without agoraphobia: Secondary | ICD-10-CM | POA: Diagnosis present

## 2023-01-11 DIAGNOSIS — C3492 Malignant neoplasm of unspecified part of left bronchus or lung: Secondary | ICD-10-CM | POA: Diagnosis not present

## 2023-01-11 DIAGNOSIS — Z8551 Personal history of malignant neoplasm of bladder: Secondary | ICD-10-CM

## 2023-01-11 DIAGNOSIS — R000 Tachycardia, unspecified: Secondary | ICD-10-CM | POA: Diagnosis not present

## 2023-01-11 DIAGNOSIS — J690 Pneumonitis due to inhalation of food and vomit: Secondary | ICD-10-CM | POA: Diagnosis present

## 2023-01-11 DIAGNOSIS — J9811 Atelectasis: Secondary | ICD-10-CM | POA: Diagnosis not present

## 2023-01-11 DIAGNOSIS — J9621 Acute and chronic respiratory failure with hypoxia: Secondary | ICD-10-CM | POA: Diagnosis not present

## 2023-01-11 DIAGNOSIS — J302 Other seasonal allergic rhinitis: Secondary | ICD-10-CM | POA: Diagnosis present

## 2023-01-11 DIAGNOSIS — R109 Unspecified abdominal pain: Secondary | ICD-10-CM | POA: Diagnosis present

## 2023-01-11 DIAGNOSIS — G4700 Insomnia, unspecified: Secondary | ICD-10-CM | POA: Diagnosis present

## 2023-01-11 LAB — COMPREHENSIVE METABOLIC PANEL
ALT: 23 U/L (ref 0–44)
AST: 15 U/L (ref 15–41)
Albumin: 3.7 g/dL (ref 3.5–5.0)
Alkaline Phosphatase: 84 U/L (ref 38–126)
Anion gap: 10 (ref 5–15)
BUN: 16 mg/dL (ref 8–23)
CO2: 27 mmol/L (ref 22–32)
Calcium: 8.6 mg/dL — ABNORMAL LOW (ref 8.9–10.3)
Chloride: 100 mmol/L (ref 98–111)
Creatinine, Ser: 1.06 mg/dL — ABNORMAL HIGH (ref 0.44–1.00)
GFR, Estimated: 58 mL/min — ABNORMAL LOW (ref >60)
Glucose, Bld: 97 mg/dL (ref 70–99)
Potassium: 3.1 mmol/L — ABNORMAL LOW (ref 3.5–5.1)
Sodium: 137 mmol/L (ref 135–145)
Total Bilirubin: 1.6 mg/dL — ABNORMAL HIGH (ref 0.3–1.2)
Total Protein: 7.6 g/dL (ref 6.5–8.1)

## 2023-01-11 LAB — CBC
HCT: 41.7 % (ref 36.0–46.0)
Hemoglobin: 13.2 g/dL (ref 12.0–15.0)
MCH: 27.3 pg (ref 26.0–34.0)
MCHC: 31.7 g/dL (ref 30.0–36.0)
MCV: 86.2 fL (ref 80.0–100.0)
Platelets: 426 10*3/uL — ABNORMAL HIGH (ref 150–400)
RBC: 4.84 MIL/uL (ref 3.87–5.11)
RDW: 14.6 % (ref 11.5–15.5)
WBC: 16.8 10*3/uL — ABNORMAL HIGH (ref 4.0–10.5)
nRBC: 0 % (ref 0.0–0.2)

## 2023-01-11 LAB — URINALYSIS, ROUTINE W REFLEX MICROSCOPIC
Bilirubin Urine: NEGATIVE
Glucose, UA: NEGATIVE mg/dL
Ketones, ur: NEGATIVE mg/dL
Nitrite: NEGATIVE
Protein, ur: 30 mg/dL — AB
Specific Gravity, Urine: 1.016 (ref 1.005–1.030)
pH: 5 (ref 5.0–8.0)

## 2023-01-11 LAB — LIPASE, BLOOD: Lipase: 27 U/L (ref 11–51)

## 2023-01-11 MED ORDER — ONDANSETRON HCL 4 MG/2ML IJ SOLN
4.0000 mg | Freq: Once | INTRAMUSCULAR | Status: AC
  Administered 2023-01-11: 4 mg via INTRAVENOUS
  Filled 2023-01-11: qty 2

## 2023-01-11 MED ORDER — IOHEXOL 350 MG/ML SOLN
75.0000 mL | Freq: Once | INTRAVENOUS | Status: AC | PRN
  Administered 2023-01-11: 75 mL via INTRAVENOUS

## 2023-01-11 MED ORDER — SODIUM CHLORIDE 0.9 % IV BOLUS
1000.0000 mL | Freq: Once | INTRAVENOUS | Status: AC
  Administered 2023-01-11: 1000 mL via INTRAVENOUS

## 2023-01-11 MED ORDER — MORPHINE SULFATE (PF) 4 MG/ML IV SOLN
4.0000 mg | Freq: Once | INTRAVENOUS | Status: AC
  Administered 2023-01-11: 4 mg via INTRAVENOUS
  Filled 2023-01-11: qty 1

## 2023-01-11 NOTE — Telephone Encounter (Signed)
Called and spoke with patient. She verbalized understanding.   Nothing further needed at time of call.  

## 2023-01-11 NOTE — Telephone Encounter (Signed)
Trying to eat stuff to keep on her stomach but have no appetite and her stomach keep cramping she is on oxygen and her pcp is not avail today

## 2023-01-11 NOTE — ED Triage Notes (Signed)
The pt reports that she has not been able to eat for 2 weeks and she has been nauseated but nothing comes up  she saw a doctor yesterday in the office  that told her to come today     she had a c-t of her lungs yesterday    she has a history of anxiety

## 2023-01-11 NOTE — ED Provider Notes (Incomplete)
EMERGENCY DEPARTMENT AT [facility] Provider Note   CSN: [id number] Arrival date & time: 01/11/23  1640     History {Add pertinent medical, surgical, social history, OB history to HPI:1} Chief Complaint  Patient presents with   Abdominal Pain    [name] is a 67 y.o. female.  The history is provided by the patient and medical records. No language interpreter was used.  Abdominal Pain  Pt is a 67yo female presenting with CC of abd pain and N. Pt reports it began about 2wks ago around the time she was informed that her lung cancer had returned. Pt denies any other inciting event. Pt has PMHx of lung CA, bladder CA, anxiety, COPD, and emphysema. Pt's abd pain was described as diffuse, aching, sharp, and crampy. Pt's pain originally was intermittent, but it has progressed to constant. Pt has had a loss of appetite where she felt nauseated with food and would vomit. Pt has had little food due to this nausea and abd pain, but she has been able to tolerate some fluids. Pt reports her vomiting to be nonbloody, but she "does not have anything coming up" at this point in time. Pt also mentioned she has been having diarrhea that has been progressing and nonbloody. She was prescribed zofran by her pulmonologist for her symptoms, but the med was not helpful. Plus, pt had a HA at one point described as a "tension HA behind her eye" that was relieved with tylenol. Tylenol was not helpful for abd pain. Pt denies CP, vision changes, loss of motor functions, and weakness.    Home Medications Prior to Admission medications   Medication Sig Start Date End Date Taking? Authorizing Provider  albuterol (VENTOLIN HFA) 108 (90 Base) MCG/ACT inhaler USE 2 INHALATIONS BY MOUTH  INTO THE LUNGS EVERY 6  HOURS AS NEEDED FOR  SHORTNESS OF BREATH OR  WHEEZING 11/18/22   Fayette Pho, MD  alendronate (FOSAMAX) 70 MG tablet Take 1 tablet (70 mg total) by mouth every 7 (seven) days. Take with a  full glass of water on an empty stomach. 12/27/22   Fayette Pho, MD  amLODipine (NORVASC) 10 MG tablet Take 1 tablet (10 mg total) by mouth daily. 10/20/22   Eber Hong, MD  amoxicillin-clavulanate (AUGMENTIN) 875-125 MG tablet Take 1 tablet by mouth 2 (two) times daily for 5 days. 01/06/23 01/11/23  Fayette Pho, MD  atorvastatin (LIPITOR) 40 MG tablet TAKE 1 TABLET BY MOUTH AT  BEDTIME 07/04/22   Sabino Dick, DO  baclofen (LIORESAL) 10 MG tablet Take 0.5 tablets (5 mg total) by mouth 3 (three) times daily. Patient not taking: Reported on 11/28/2022 06/08/21   Celedonio Savage, MD  Budeson-Glycopyrrol-Formoterol (BREZTRI AEROSPHERE) 160-9-4.8 MCG/ACT AERO Inhale 2 puffs into the lungs in the morning and at bedtime. 11/18/22   Fayette Pho, MD  calcium-vitamin D [facility] WITH D) 500-5 MG-MCG tablet Take 1 tablet by mouth daily with breakfast. 10/03/22   Fayette Pho, MD  Cholecalciferol (VITAMIN D) 50 MCG (2000 UT) tablet Take 1 tablet (2,000 Units total) by mouth daily. 10/03/22   Fayette Pho, MD  citalopram (CELEXA) 10 MG tablet Take 1 tablet (10 mg total) by mouth daily. Follow up in 3-4 weeks for side effect check. 12/06/22   Fayette Pho, MD  diclofenac Sodium (VOLTAREN) 1 % GEL Apply 2 g topically 4 (four) times daily. Apply to painful area of ribs. Need to use regularly for relief. 01/19/21   Fayette Pho, MD  fentaNYL (DURAGESIC) 25 MCG/HR Place 1 patch onto the skin every 3 (three) days. 12/26/22   Fayette Pho, MD  fluticasone [facility]) 50 MCG/ACT nasal spray SHAKE LIQUID AND USE 2 SPRAYS IN [facility] NOSTRIL DAILY 05/02/22   Fayette Pho, MD  gabapentin (NEURONTIN) 600 MG tablet Take 1 tablet (600 mg total) by mouth 3 (three) times daily. 08/19/22   Fayette Pho, MD  hydrOXYzine (ATARAX) 50 MG tablet Take 2 tablets (100 mg total) by mouth at bedtime as needed for anxiety or itching (sleep). 11/30/22   Fayette Pho, MD  ibuprofen (ADVIL) 800 MG tablet Take 1 tablet (800  mg total) by mouth every 8 (eight) hours as needed. 10/14/21   Kinsinger, De Blanch, MD  lidocaine (LIDODERM) 5 % Place 1 patch onto the skin daily. Remove & Discard patch within 12 hours or as directed by MD 08/19/22   Fayette Pho, MD  MELATONIN GUMMIES PO Take 10 mg by mouth at bedtime.    [provider]  naloxone [facility]) nasal spray 4 mg/0.1 mL Use for suspected opioid overdose - too sleepy to wake up, difficulty breathing 05/13/22   Fayette Pho, MD  ondansetron (ZOFRAN) 4 MG tablet Take 1 tablet (4 mg total) by mouth every 8 (eight) hours as needed for nausea or vomiting. Take one tablet before starting each prep. 12/14/22   Jenel Lucks, MD  ondansetron (ZOFRAN-ODT) 4 MG disintegrating tablet Take 1 tablet (4 mg total) by mouth every 8 (eight) hours as needed for nausea or vomiting. 01/02/23   Fayette Pho, MD  pantoprazole (PROTONIX) 40 MG tablet Take 1 tablet (40 mg total) by mouth daily as needed (acid indigestion/heartburn.). 05/27/22   Fayette Pho, MD  polyethylene glycol powder [facility]) 17 GM/SCOOP powder Take 17 g by mouth 2 (two) times daily as needed. 05/27/22   Fayette Pho, MD  senna (SENOKOT) 8.6 MG TABS tablet Take 1 tablet (8.6 mg total) by mouth at bedtime. 05/27/22   Fayette Pho, MD  triamcinolone cream (KENALOG) 0.1 % Apply 1 Application topically 2 (two) times daily. 12/06/22   Fayette Pho, MD      Allergies    Codeine and Prednisone    Review of Systems   Review of Systems  Gastrointestinal:  Positive for abdominal pain.  All other systems reviewed and are negative.   Physical Exam Updated Vital Signs BP (!) 167/90 (BP Location: Right Arm)   Pulse (!) 111   Temp 97.9 F (36.6 C) (Oral)   Resp (!) 23   Ht 5\' 8"  (1.727 m)   Wt 79.2 kg   SpO2 94%   BMI 26.55 kg/m  Physical Exam Vitals and nursing note reviewed.  Constitutional:      General: She is not in acute distress.    Appearance: She is well-developed.  HENT:      Head: Atraumatic.  Eyes:     Extraocular Movements: Extraocular movements intact.     Conjunctiva/sclera: Conjunctivae normal.     Pupils: Pupils are equal, round, and reactive to light.  Cardiovascular:     Rate and Rhythm: Tachycardia present.  Pulmonary:     Effort: Pulmonary effort is normal.  Abdominal:     Palpations: Abdomen is soft.     Tenderness: There is generalized abdominal tenderness. There is no guarding or rebound.     Hernia: No hernia is present.  Musculoskeletal:     Cervical back: Neck supple.  Skin:    Findings: No rash.  Neurological:  Mental Status: She is alert.  Psychiatric:        Mood and Affect: Mood normal.     ED Results / Procedures / Treatments   Labs (all labs ordered are listed, but only abnormal results are displayed) Labs Reviewed  COMPREHENSIVE METABOLIC PANEL - Abnormal; Notable for the following components:      Result Value   Potassium 3.1 (*)    Creatinine, Ser 1.06 (*)    Calcium 8.6 (*)    Total Bilirubin 1.6 (*)    GFR, Estimated 58 (*)    All other components within normal limits  CBC - Abnormal; Notable for the following components:   WBC 16.8 (*)    Platelets 426 (*)    All other components within normal limits  URINALYSIS, ROUTINE W REFLEX MICROSCOPIC - Abnormal; Notable for the following components:   APPearance HAZY (*)    Hgb urine dipstick SMALL (*)    Protein, ur 30 (*)    Leukocytes,Ua TRACE (*)    Bacteria, UA RARE (*)    All other components within normal limits  LIPASE, BLOOD    EKG None  Radiology No results found.  Procedures Procedures  {Document cardiac monitor, telemetry assessment procedure when appropriate:1}  Medications Ordered in ED Medications  ondansetron (ZOFRAN) injection 4 mg (4 mg Intravenous Given 01/11/23 1716)  morphine (PF) 4 MG/ML injection 4 mg (4 mg Intravenous Given 01/11/23 2154)  sodium chloride 0.9 % bolus 1,000 mL (1,000 mLs Intravenous New Bag/Given 01/11/23 2155)     ED Course/ Medical Decision Making/ A&P   {   Click here for ABCD2, HEART and other calculatorsREFRESH Note before signing :1}                          Medical Decision Making Amount and/or Complexity of Data Reviewed Radiology: ordered.  Risk Prescription drug management.   BP (!) 167/90 (BP Location: Right Arm)   Pulse (!) 111   Temp 97.9 F (36.6 C) (Oral)   Resp (!) 23   Ht 5\' 8"  (1.727 m)   Wt 79.2 kg   SpO2 94%   BMI 26.55 kg/m   86:15 PM  67 year old female with severe COPD, emphysema, lung cancer status post radiation therapy, recent medicine PET scan done in showing recurrence of her disease in her right hilum and adenopathy within the peritracheal region presenting to ER today with complaint of abdominal pain.  Patient has had extensive smoking history.  She was recently diagnosed with lung cancer and was last seen by her pulmonologist 2 days ago for regular follow-up on her lung nodule.  She was noted to be hypoxic and was placed on supplemental oxygen at 2 L.  2 weeks ago, patient was told that she has a reoccurrence of her lung cancer.  Since then she has had persistent abdominal discomfort.  She described pain as a sharp achy crampy sensation initially mild but now has been persistent.  She endorsed feeling nauseous occasional vomiting and having some loose stools.  She has no appetite and not eating and drinking much.  Symptoms are moderate in severity.  There is no associated fever or chills no chest pain productive cough dysuria hematuria hematochezia or melena.  Shortness of breath has been an ongoing issue and states she was placed on supplemental oxygen at 2 L by her pulmonologist when she was last seen a couple days ago.  She denies worsening shortness of breath.  On exam this is an elderly female appears slightly anxious and frustrated but in no acute respiratory distress.  She is wearing supplemental oxygen.  Heart with tachycardia, lungs with diminished lung  sounds but no overt wheezes rales or rhonchi heard.  Abdomen is soft and diffusely tender without guarding or rebound tenderness.  No CVA tenderness.  Vital signs notable for elevated blood pressure of 167/90.  She is tachycardic with heart rate of 111.  She is tachypneic with a respiratory rate of 23, her O2 sats is currently at 94% on 2 L of supplemental oxygen.  When she was on room air, O2 sats was in the mid 80s.  Please note patient was seen by her pulmonologist 2 days prior for an evaluation at that time she was noted to be hypoxic for which she was started on supplemental oxygen at home.  Will defer further pulmonary workup to her pulmonologist as a current symptoms as abdominal pain and she mention her shortness of breath has been more of a chronic issue.  -Labs ordered, independently viewed and interpreted by me.  Labs remarkable for UA without UTI, normal lipase, K+ 3.1, supplementation given, Cr 1.06 IV fluid given, WBC 16.8.   -The patient was maintained on a cardiac monitor.  I personally viewed and interpreted the cardiac monitored which showed an underlying rhythm of: sinus tachycardia -Imaging including abd/pelvis CT ordered but have not resulted yet -This patient presents to the ED for concern of abd pain, this involves an extensive number of treatment options, and is a complaint that carries with it a high risk of complications and morbidity.  The differential diagnosis includes malignancy, biliary disease, GERD, pancreatitis, colitis, appendicitis, diverticular disease, UTI, pyelonephritis -Co morbidities that complicate the patient evaluation includes lung CA, COPD, anxietry, GERD,  -Treatment includes IVF, morphine, zofran -Reevaluation of the patient after these medicines showed that the patient improved -PCP office notes or outside notes reviewed -Discussion with specialist *** -Escalation to admission/observation considered: patients feels much better, is comfortable with  discharge, and will follow up with PCP -Prescription medication considered, patient comfortable with *** -Social Determinant of Health considered which includes ***       {Document critical care time when appropriate:1} {Document review of labs and clinical decision tools ie heart score, Chads2Vasc2 etc:1}  {Document your independent review of radiology images, and any outside records:1} {Document your discussion with family members, caretakers, and with consultants:1} {Document social determinants of health affecting pt's care:1} {Document your decision making why or why not admission, treatments were needed:1} Final Clinical Impression(s) / ED Diagnoses Final diagnoses:  None    Rx / DC Orders ED Discharge Orders     None

## 2023-01-11 NOTE — Telephone Encounter (Signed)
Best for her to reach out to her PCP for management of her nausea and decreased appetite. If not taking in POs may need to go to ED for IVFs and IV antinausea medication. PCCs to follow up on bronch procedure.

## 2023-01-11 NOTE — Telephone Encounter (Signed)
Called and spoke with patient, she states that she has not been able to eat in 2 weeks and her stomach is cramping.  She was having nausea as well, but was prescribed zofran and that is better.  She is trying to drink water and ginger ale.  She is trying to eat bread, but it was not going well.  Advised I would get a message to our provider on call as Dr. Tonia Brooms is not in the office today.  She also said that someone was supposed to call her back about her procedure (bronch), however, she has not heard back.  I let her know I would contact our PCCS and have them follow up with her.  Beth, Please advise regarding symptoms.  It looks like she sees Radiation oncology and her pcp prescribed the zofran.  Thank you.

## 2023-01-12 ENCOUNTER — Telehealth (HOSPITAL_COMMUNITY): Payer: Self-pay | Admitting: Pharmacy Technician

## 2023-01-12 ENCOUNTER — Emergency Department (HOSPITAL_COMMUNITY): Payer: 59

## 2023-01-12 ENCOUNTER — Observation Stay (HOSPITAL_COMMUNITY): Payer: 59

## 2023-01-12 ENCOUNTER — Other Ambulatory Visit (HOSPITAL_COMMUNITY): Payer: Self-pay

## 2023-01-12 ENCOUNTER — Other Ambulatory Visit: Payer: Self-pay

## 2023-01-12 ENCOUNTER — Encounter (HOSPITAL_COMMUNITY): Payer: Self-pay | Admitting: Family Medicine

## 2023-01-12 DIAGNOSIS — J90 Pleural effusion, not elsewhere classified: Secondary | ICD-10-CM | POA: Diagnosis not present

## 2023-01-12 DIAGNOSIS — Z510 Encounter for antineoplastic radiation therapy: Secondary | ICD-10-CM | POA: Diagnosis not present

## 2023-01-12 DIAGNOSIS — A419 Sepsis, unspecified organism: Secondary | ICD-10-CM | POA: Diagnosis not present

## 2023-01-12 DIAGNOSIS — Z1152 Encounter for screening for COVID-19: Secondary | ICD-10-CM | POA: Diagnosis not present

## 2023-01-12 DIAGNOSIS — Z9911 Dependence on respirator [ventilator] status: Secondary | ICD-10-CM | POA: Diagnosis not present

## 2023-01-12 DIAGNOSIS — J9600 Acute respiratory failure, unspecified whether with hypoxia or hypercapnia: Secondary | ICD-10-CM | POA: Diagnosis not present

## 2023-01-12 DIAGNOSIS — J9621 Acute and chronic respiratory failure with hypoxia: Secondary | ICD-10-CM | POA: Diagnosis not present

## 2023-01-12 DIAGNOSIS — J449 Chronic obstructive pulmonary disease, unspecified: Secondary | ICD-10-CM | POA: Diagnosis not present

## 2023-01-12 DIAGNOSIS — R1909 Other intra-abdominal and pelvic swelling, mass and lump: Secondary | ICD-10-CM | POA: Diagnosis not present

## 2023-01-12 DIAGNOSIS — I700 Atherosclerosis of aorta: Secondary | ICD-10-CM | POA: Diagnosis present

## 2023-01-12 DIAGNOSIS — J9612 Chronic respiratory failure with hypercapnia: Secondary | ICD-10-CM | POA: Diagnosis present

## 2023-01-12 DIAGNOSIS — Z515 Encounter for palliative care: Secondary | ICD-10-CM | POA: Diagnosis not present

## 2023-01-12 DIAGNOSIS — F32A Depression, unspecified: Secondary | ICD-10-CM | POA: Diagnosis present

## 2023-01-12 DIAGNOSIS — C801 Malignant (primary) neoplasm, unspecified: Secondary | ICD-10-CM | POA: Diagnosis not present

## 2023-01-12 DIAGNOSIS — Y848 Other medical procedures as the cause of abnormal reaction of the patient, or of later complication, without mention of misadventure at the time of the procedure: Secondary | ICD-10-CM | POA: Diagnosis not present

## 2023-01-12 DIAGNOSIS — I959 Hypotension, unspecified: Secondary | ICD-10-CM | POA: Diagnosis not present

## 2023-01-12 DIAGNOSIS — C799 Secondary malignant neoplasm of unspecified site: Secondary | ICD-10-CM | POA: Diagnosis present

## 2023-01-12 DIAGNOSIS — J189 Pneumonia, unspecified organism: Secondary | ICD-10-CM | POA: Diagnosis present

## 2023-01-12 DIAGNOSIS — C3490 Malignant neoplasm of unspecified part of unspecified bronchus or lung: Secondary | ICD-10-CM | POA: Diagnosis not present

## 2023-01-12 DIAGNOSIS — R4589 Other symptoms and signs involving emotional state: Secondary | ICD-10-CM | POA: Diagnosis not present

## 2023-01-12 DIAGNOSIS — Z431 Encounter for attention to gastrostomy: Secondary | ICD-10-CM | POA: Diagnosis not present

## 2023-01-12 DIAGNOSIS — R0602 Shortness of breath: Secondary | ICD-10-CM

## 2023-01-12 DIAGNOSIS — C3431 Malignant neoplasm of lower lobe, right bronchus or lung: Secondary | ICD-10-CM | POA: Diagnosis present

## 2023-01-12 DIAGNOSIS — Z66 Do not resuscitate: Secondary | ICD-10-CM | POA: Diagnosis not present

## 2023-01-12 DIAGNOSIS — J441 Chronic obstructive pulmonary disease with (acute) exacerbation: Secondary | ICD-10-CM | POA: Diagnosis present

## 2023-01-12 DIAGNOSIS — C3491 Malignant neoplasm of unspecified part of right bronchus or lung: Secondary | ICD-10-CM | POA: Diagnosis not present

## 2023-01-12 DIAGNOSIS — Z4682 Encounter for fitting and adjustment of non-vascular catheter: Secondary | ICD-10-CM | POA: Diagnosis not present

## 2023-01-12 DIAGNOSIS — C3492 Malignant neoplasm of unspecified part of left bronchus or lung: Secondary | ICD-10-CM

## 2023-01-12 DIAGNOSIS — J439 Emphysema, unspecified: Secondary | ICD-10-CM | POA: Diagnosis not present

## 2023-01-12 DIAGNOSIS — J9611 Chronic respiratory failure with hypoxia: Secondary | ICD-10-CM | POA: Diagnosis not present

## 2023-01-12 DIAGNOSIS — F419 Anxiety disorder, unspecified: Secondary | ICD-10-CM | POA: Diagnosis present

## 2023-01-12 DIAGNOSIS — Y842 Radiological procedure and radiotherapy as the cause of abnormal reaction of the patient, or of later complication, without mention of misadventure at the time of the procedure: Secondary | ICD-10-CM | POA: Diagnosis present

## 2023-01-12 DIAGNOSIS — F1120 Opioid dependence, uncomplicated: Secondary | ICD-10-CM | POA: Diagnosis present

## 2023-01-12 DIAGNOSIS — R918 Other nonspecific abnormal finding of lung field: Secondary | ICD-10-CM | POA: Diagnosis not present

## 2023-01-12 DIAGNOSIS — J811 Chronic pulmonary edema: Secondary | ICD-10-CM | POA: Diagnosis not present

## 2023-01-12 DIAGNOSIS — I10 Essential (primary) hypertension: Secondary | ICD-10-CM | POA: Diagnosis not present

## 2023-01-12 DIAGNOSIS — C7800 Secondary malignant neoplasm of unspecified lung: Secondary | ICD-10-CM | POA: Diagnosis not present

## 2023-01-12 DIAGNOSIS — I3139 Other pericardial effusion (noninflammatory): Secondary | ICD-10-CM | POA: Diagnosis not present

## 2023-01-12 DIAGNOSIS — D649 Anemia, unspecified: Secondary | ICD-10-CM | POA: Diagnosis not present

## 2023-01-12 DIAGNOSIS — J9601 Acute respiratory failure with hypoxia: Secondary | ICD-10-CM | POA: Diagnosis not present

## 2023-01-12 DIAGNOSIS — C771 Secondary and unspecified malignant neoplasm of intrathoracic lymph nodes: Secondary | ICD-10-CM | POA: Diagnosis not present

## 2023-01-12 DIAGNOSIS — D638 Anemia in other chronic diseases classified elsewhere: Secondary | ICD-10-CM | POA: Diagnosis present

## 2023-01-12 DIAGNOSIS — J988 Other specified respiratory disorders: Secondary | ICD-10-CM | POA: Diagnosis present

## 2023-01-12 DIAGNOSIS — R6521 Severe sepsis with septic shock: Secondary | ICD-10-CM | POA: Diagnosis not present

## 2023-01-12 DIAGNOSIS — R109 Unspecified abdominal pain: Secondary | ICD-10-CM | POA: Diagnosis present

## 2023-01-12 DIAGNOSIS — Z87891 Personal history of nicotine dependence: Secondary | ICD-10-CM | POA: Diagnosis not present

## 2023-01-12 DIAGNOSIS — I480 Paroxysmal atrial fibrillation: Secondary | ICD-10-CM | POA: Diagnosis not present

## 2023-01-12 DIAGNOSIS — Z7189 Other specified counseling: Secondary | ICD-10-CM | POA: Diagnosis not present

## 2023-01-12 DIAGNOSIS — Z4659 Encounter for fitting and adjustment of other gastrointestinal appliance and device: Secondary | ICD-10-CM | POA: Diagnosis not present

## 2023-01-12 DIAGNOSIS — K859 Acute pancreatitis without necrosis or infection, unspecified: Secondary | ICD-10-CM | POA: Diagnosis not present

## 2023-01-12 DIAGNOSIS — C342 Malignant neoplasm of middle lobe, bronchus or lung: Secondary | ICD-10-CM | POA: Diagnosis not present

## 2023-01-12 DIAGNOSIS — R590 Localized enlarged lymph nodes: Secondary | ICD-10-CM

## 2023-01-12 DIAGNOSIS — C3401 Malignant neoplasm of right main bronchus: Secondary | ICD-10-CM | POA: Diagnosis not present

## 2023-01-12 DIAGNOSIS — R5381 Other malaise: Secondary | ICD-10-CM | POA: Diagnosis not present

## 2023-01-12 DIAGNOSIS — J9690 Respiratory failure, unspecified, unspecified whether with hypoxia or hypercapnia: Secondary | ICD-10-CM | POA: Diagnosis not present

## 2023-01-12 DIAGNOSIS — E43 Unspecified severe protein-calorie malnutrition: Secondary | ICD-10-CM | POA: Diagnosis not present

## 2023-01-12 DIAGNOSIS — J383 Other diseases of vocal cords: Secondary | ICD-10-CM

## 2023-01-12 DIAGNOSIS — Z6831 Body mass index (BMI) 31.0-31.9, adult: Secondary | ICD-10-CM | POA: Diagnosis not present

## 2023-01-12 DIAGNOSIS — C3432 Malignant neoplasm of lower lobe, left bronchus or lung: Secondary | ICD-10-CM | POA: Diagnosis not present

## 2023-01-12 DIAGNOSIS — Z930 Tracheostomy status: Secondary | ICD-10-CM | POA: Diagnosis not present

## 2023-01-12 DIAGNOSIS — G928 Other toxic encephalopathy: Secondary | ICD-10-CM | POA: Diagnosis not present

## 2023-01-12 DIAGNOSIS — R0603 Acute respiratory distress: Secondary | ICD-10-CM | POA: Diagnosis present

## 2023-01-12 DIAGNOSIS — J9811 Atelectasis: Secondary | ICD-10-CM | POA: Diagnosis not present

## 2023-01-12 DIAGNOSIS — F418 Other specified anxiety disorders: Secondary | ICD-10-CM | POA: Diagnosis not present

## 2023-01-12 DIAGNOSIS — R140 Abdominal distension (gaseous): Secondary | ICD-10-CM | POA: Diagnosis not present

## 2023-01-12 DIAGNOSIS — J9602 Acute respiratory failure with hypercapnia: Secondary | ICD-10-CM | POA: Diagnosis not present

## 2023-01-12 DIAGNOSIS — N281 Cyst of kidney, acquired: Secondary | ICD-10-CM | POA: Diagnosis not present

## 2023-01-12 DIAGNOSIS — R0902 Hypoxemia: Secondary | ICD-10-CM | POA: Diagnosis not present

## 2023-01-12 LAB — BLOOD GAS, VENOUS
Acid-Base Excess: 3.2 mmol/L — ABNORMAL HIGH (ref 0.0–2.0)
Bicarbonate: 30.6 mmol/L — ABNORMAL HIGH (ref 20.0–28.0)
O2 Saturation: 20.1 %
Patient temperature: 37
pCO2, Ven: 58 mmHg (ref 44–60)
pH, Ven: 7.33 (ref 7.25–7.43)
pO2, Ven: <31 mmHg — CL (ref 32–45)

## 2023-01-12 LAB — ECHOCARDIOGRAM COMPLETE BUBBLE STUDY
P 1/2 time: 379 msec
S' Lateral: 3.25 cm

## 2023-01-12 LAB — BASIC METABOLIC PANEL
Anion gap: 9 (ref 5–15)
BUN: 13 mg/dL (ref 8–23)
CO2: 26 mmol/L (ref 22–32)
Calcium: 8.2 mg/dL — ABNORMAL LOW (ref 8.9–10.3)
Chloride: 103 mmol/L (ref 98–111)
Creatinine, Ser: 0.9 mg/dL (ref 0.44–1.00)
GFR, Estimated: >60 mL/min (ref >60)
Glucose, Bld: 89 mg/dL (ref 70–99)
Potassium: 3.8 mmol/L (ref 3.5–5.1)
Sodium: 138 mmol/L (ref 135–145)

## 2023-01-12 LAB — TROPONIN I (HIGH SENSITIVITY)
Troponin I (High Sensitivity): 35 ng/L — ABNORMAL HIGH (ref <18)
Troponin I (High Sensitivity): 24 ng/L — ABNORMAL HIGH (ref <18)

## 2023-01-12 LAB — TSH: TSH: 2.888 u[IU]/mL (ref 0.350–4.500)

## 2023-01-12 LAB — LACTIC ACID, PLASMA: Lactic Acid, Venous: 0.8 mmol/L (ref 0.5–1.9)

## 2023-01-12 LAB — HIV ANTIBODY (ROUTINE TESTING W REFLEX): HIV Screen 4th Generation wRfx: NONREACTIVE

## 2023-01-12 MED ORDER — METHYLPREDNISOLONE SODIUM SUCC 40 MG IJ SOLR
40.0000 mg | Freq: Two times a day (BID) | INTRAMUSCULAR | Status: DC
  Administered 2023-01-12 – 2023-01-13 (×2): 40 mg via INTRAVENOUS
  Filled 2023-01-12 (×2): qty 1

## 2023-01-12 MED ORDER — SODIUM CHLORIDE 0.9 % IV SOLN
1.0000 g | INTRAVENOUS | Status: DC
  Administered 2023-01-12: 1 g via INTRAVENOUS
  Filled 2023-01-12: qty 10

## 2023-01-12 MED ORDER — ENOXAPARIN SODIUM 40 MG/0.4ML IJ SOSY: 40.0000 mg | PREFILLED_SYRINGE | Freq: Every day | INTRAMUSCULAR | Status: DC

## 2023-01-12 MED ORDER — ACETAMINOPHEN 325 MG PO TABS
650.0000 mg | ORAL_TABLET | Freq: Four times a day (QID) | ORAL | Status: DC | PRN
  Administered 2023-01-13 – 2023-01-15 (×5): 650 mg via ORAL
  Filled 2023-01-12 (×5): qty 2

## 2023-01-12 MED ORDER — PERFLUTREN LIPID MICROSPHERE
1.0000 mL | INTRAVENOUS | Status: AC | PRN
  Administered 2023-01-12: 3 mL via INTRAVENOUS

## 2023-01-12 MED ORDER — ARFORMOTEROL TARTRATE 15 MCG/2ML IN NEBU
15.0000 ug | INHALATION_SOLUTION | Freq: Two times a day (BID) | RESPIRATORY_TRACT | Status: DC
  Administered 2023-01-12 – 2023-03-05 (×105): 15 ug via RESPIRATORY_TRACT
  Filled 2023-01-12 (×105): qty 2

## 2023-01-12 MED ORDER — ALBUTEROL SULFATE (2.5 MG/3ML) 0.083% IN NEBU
2.5000 mg | INHALATION_SOLUTION | RESPIRATORY_TRACT | Status: DC | PRN
  Administered 2023-01-12 – 2023-03-03 (×9): 2.5 mg via RESPIRATORY_TRACT
  Filled 2023-01-12 (×14): qty 3

## 2023-01-12 MED ORDER — METOPROLOL TARTRATE 25 MG PO TABS: 50.0000 mg | ORAL_TABLET | Freq: Two times a day (BID) | ORAL | Status: DC

## 2023-01-12 MED ORDER — HYDROXYZINE HCL 25 MG PO TABS: 100.0000 mg | ORAL_TABLET | Freq: Every evening | ORAL | Status: DC | PRN

## 2023-01-12 MED ORDER — AZITHROMYCIN 250 MG PO TABS
500.0000 mg | ORAL_TABLET | Freq: Once | ORAL | Status: AC
  Administered 2023-01-12: 500 mg via ORAL
  Filled 2023-01-12: qty 2

## 2023-01-12 MED ORDER — POTASSIUM CHLORIDE CRYS ER 20 MEQ PO TBCR
40.0000 meq | EXTENDED_RELEASE_TABLET | Freq: Once | ORAL | Status: AC
  Administered 2023-01-12: 40 meq via ORAL
  Filled 2023-01-12: qty 2

## 2023-01-12 MED ORDER — LORAZEPAM 1 MG PO TABS
0.5000 mg | ORAL_TABLET | Freq: Once | ORAL | Status: AC
  Administered 2023-01-12: 0.5 mg via ORAL
  Filled 2023-01-12: qty 1

## 2023-01-12 MED ORDER — REVEFENACIN 175 MCG/3ML IN SOLN
175.0000 ug | Freq: Every day | RESPIRATORY_TRACT | Status: DC
  Administered 2023-01-13 – 2023-03-05 (×52): 175 ug via RESPIRATORY_TRACT
  Filled 2023-01-12 (×50): qty 3

## 2023-01-12 MED ORDER — IPRATROPIUM-ALBUTEROL 0.5-2.5 (3) MG/3ML IN SOLN
3.0000 mL | Freq: Four times a day (QID) | RESPIRATORY_TRACT | Status: DC
  Administered 2023-01-12 (×2): 3 mL via RESPIRATORY_TRACT
  Filled 2023-01-12 (×2): qty 3

## 2023-01-12 MED ORDER — SODIUM CHLORIDE 0.9 % IV SOLN
1.0000 g | Freq: Once | INTRAVENOUS | Status: AC
  Administered 2023-01-12: 1 g via INTRAVENOUS
  Filled 2023-01-12: qty 10

## 2023-01-12 MED ORDER — BUDESONIDE 0.25 MG/2ML IN SUSP
0.2500 mg | Freq: Two times a day (BID) | RESPIRATORY_TRACT | Status: DC
  Administered 2023-01-12 – 2023-01-22 (×21): 0.25 mg via RESPIRATORY_TRACT
  Filled 2023-01-12 (×21): qty 2

## 2023-01-12 MED ORDER — LORAZEPAM 2 MG/ML IJ SOLN
0.5000 mg | Freq: Once | INTRAMUSCULAR | Status: AC
  Administered 2023-01-12: 0.5 mg via INTRAVENOUS
  Filled 2023-01-12: qty 1

## 2023-01-12 MED ORDER — APIXABAN 5 MG PO TABS
5.0000 mg | ORAL_TABLET | Freq: Two times a day (BID) | ORAL | Status: DC
  Administered 2023-01-12 – 2023-01-14 (×6): 5 mg via ORAL
  Filled 2023-01-12 (×6): qty 1

## 2023-01-12 MED ORDER — HYDROMORPHONE HCL 1 MG/ML IJ SOLN
0.5000 mg | Freq: Once | INTRAMUSCULAR | Status: AC
  Administered 2023-01-12: 0.5 mg via INTRAVENOUS
  Filled 2023-01-12: qty 1

## 2023-01-12 MED ORDER — ACETAMINOPHEN 650 MG RE SUPP: 650.0000 mg | Freq: Four times a day (QID) | RECTAL | Status: DC | PRN

## 2023-01-12 MED ORDER — HYDROXYZINE HCL 25 MG PO TABS: 25.0000 mg | ORAL_TABLET | Freq: Three times a day (TID) | ORAL | Status: DC | PRN

## 2023-01-12 MED ORDER — SODIUM CHLORIDE 0.9 % IV BOLUS
500.0000 mL | Freq: Once | INTRAVENOUS | Status: AC
  Administered 2023-01-12: 500 mL via INTRAVENOUS

## 2023-01-12 MED ORDER — METOPROLOL TARTRATE 25 MG PO TABS
25.0000 mg | ORAL_TABLET | Freq: Two times a day (BID) | ORAL | Status: DC
  Administered 2023-01-12 – 2023-01-15 (×8): 25 mg via ORAL
  Filled 2023-01-12 (×8): qty 1

## 2023-01-12 MED ORDER — AMLODIPINE BESYLATE 10 MG PO TABS
10.0000 mg | ORAL_TABLET | Freq: Every day | ORAL | Status: DC
  Administered 2023-01-12 – 2023-01-13 (×2): 10 mg via ORAL
  Filled 2023-01-12 (×2): qty 1

## 2023-01-12 MED ORDER — IPRATROPIUM-ALBUTEROL 0.5-2.5 (3) MG/3ML IN SOLN
3.0000 mL | RESPIRATORY_TRACT | Status: DC
  Administered 2023-01-12 – 2023-01-14 (×12): 3 mL via RESPIRATORY_TRACT
  Filled 2023-01-12 (×11): qty 3

## 2023-01-12 MED ORDER — AZITHROMYCIN 500 MG PO TABS
500.0000 mg | ORAL_TABLET | Freq: Every day | ORAL | Status: AC
  Administered 2023-01-13 – 2023-01-14 (×2): 500 mg via ORAL
  Filled 2023-01-12 (×2): qty 1

## 2023-01-12 MED ORDER — METHYLPREDNISOLONE SODIUM SUCC 40 MG IJ SOLR
40.0000 mg | Freq: Every day | INTRAMUSCULAR | Status: DC
  Administered 2023-01-12: 40 mg via INTRAVENOUS
  Filled 2023-01-12: qty 1

## 2023-01-12 MED ORDER — SODIUM CHLORIDE 0.9 % IV SOLN
INTRAVENOUS | Status: DC | PRN
  Administered 2023-01-17: 10 mL/h via INTRAVENOUS

## 2023-01-12 MED ORDER — HYDROXYZINE HCL 10 MG PO TABS
10.0000 mg | ORAL_TABLET | Freq: Three times a day (TID) | ORAL | Status: DC | PRN
  Administered 2023-01-12: 10 mg via ORAL
  Filled 2023-01-12: qty 1

## 2023-01-12 MED ORDER — CITALOPRAM HYDROBROMIDE 10 MG PO TABS
10.0000 mg | ORAL_TABLET | Freq: Every day | ORAL | Status: DC
  Administered 2023-01-12 – 2023-01-15 (×4): 10 mg via ORAL
  Filled 2023-01-12 (×4): qty 1

## 2023-01-12 MED ORDER — ONDANSETRON HCL 4 MG/2ML IJ SOLN
4.0000 mg | Freq: Three times a day (TID) | INTRAMUSCULAR | Status: DC | PRN
  Administered 2023-01-12 – 2023-02-26 (×15): 4 mg via INTRAVENOUS
  Filled 2023-01-12 (×15): qty 2

## 2023-01-12 MED ORDER — METOPROLOL TARTRATE 25 MG PO TABS
25.0000 mg | ORAL_TABLET | Freq: Two times a day (BID) | ORAL | Status: DC
  Administered 2023-01-12: 25 mg via ORAL
  Filled 2023-01-12: qty 1

## 2023-01-12 MED ORDER — PANTOPRAZOLE SODIUM 40 MG PO TBEC
40.0000 mg | DELAYED_RELEASE_TABLET | Freq: Every day | ORAL | Status: DC | PRN
  Administered 2023-01-12 – 2023-01-13 (×2): 40 mg via ORAL
  Filled 2023-01-12 (×2): qty 1

## 2023-01-12 MED ORDER — ATORVASTATIN CALCIUM 40 MG PO TABS
40.0000 mg | ORAL_TABLET | Freq: Every day | ORAL | Status: DC
  Administered 2023-01-12 – 2023-01-15 (×5): 40 mg via ORAL
  Filled 2023-01-12 (×5): qty 1

## 2023-01-12 NOTE — Progress Notes (Signed)
Pt admitted to rm 13 from 5 central . Initiated tele. Oriented pt to the unit. VSS. Call bell within reach.   Lawson Radar, RN

## 2023-01-12 NOTE — H&P (Signed)
Hospital Admission History and Physical Service Pager: [phone]  Patient name: [name] Medical record number: [id number] Date of Birth: September 27, 1955 Age: 67 y.o. Gender: female  Primary Care Provider: Fayette Pho, MD Consultants: None  Code Status: DNR, patient explicitly stated she would not want life saving intervention if her heart was to stop, confirmed by both providers in room. Patient understands she may verbally revoke this at any time. Preferred Emergency Contact:  Contact Information     Name Relation Home Work [place] Daughter [phone]  [phone]   [facility] Mother [phone]         Chief Complaint: N/V, abdominal pain   Assessment and Plan: [name] is a 67 y.o. female presenting with abdominal pain and acute hypoxic respiratory failure.  Of note, abdominal pain started after patient received news that her lung cancer returned.  Differential is broad and includes: Postobstructive pneumonia (heterogeneous consolidations and groundglass disease right lower lobe, leukocytosis, oxygen requirement, slightly diminished breath sounds on right), lung cancer (NM PET: stage IIa non-small cell lung cancer of left lower lobe 12/19/2022, scheduled for biopsy on Jan 17, 2023), new onset atrial fibrillation (developed atrial fibrillation on monitor during admission exam, however leads were falling off and inconsistently reading, plan for EKG), anxiety (patient had panic attack during admission exam, increased work of breathing and heart rate, improved after talking and albuterol inhaler).   Other differentials considered: ACS (pending troponins, repeat EKG), COPD exacerbation (not wheezing, no increased sputum), pancreatitis/duodenitis (CT showing inflammation, however lipase WNL), viral gastroenteritis (endorsing diarrhea, nausea vomiting)  Additionally, although patient has increased work of breathing-her work of breathing and wheezing  is distractible on exam.  During conversation patient would breathe normally and stop wheezing, however as patient had anxiety she would start to breathe heavily and wheeze was referred from upper airway-lungs clear to auscultation, slightly diminished on right.  * Acute hypoxic respiratory failure (HCC) Primarily suspect pneumonia, lung cancer, and concern for atrial fibrillation.  Slightly diminished breath sounds on right lower base. Anxiety clearly a factor in exacerbating symptoms. - Admit to FMTS, med telemetry, attending Dr. Manson Passey - Consider consult to pulmonology, question if bronchoscopy could be performed during admission - Continue ceftriaxone and azithromycin for CAP coverage - Scheduled DuoNebs for patient comfort - Cardiac telemetry (c/f PAF) - Consider cardiology consult if patient has A-fib - Repeat EKG, trend troponins - Supplemental O2, maintain saturations 88-92% (COPD) - AM CBC, BMP  Anxiety Panic attack in room, increased RR and HR, patient endorsed severe anxiety. Talked patient through anxiety and improved.  Anxiety around return of lung cancer and ongoing illness.  She is not taking maintenance medications, has as needed hydroxyzine which she reports is not helping.  Due to the significant state of her anxiety and panic attack, will trial dose of benzodiazepine. - 0.5 mg Ativan x 1  Abdominal pain Diffuse abdominal pain, patient's primary concern nausea vomiting and diarrhea.  Will hold patient's MiraLAX and senna.  Patient's pain and nausea improved with Zofran, she is hungry and would like to eat. - Zofran IV - Regular diet   Chronic and stable medical conditions HTN: Continue amlodipine HLD: Continue atorvastatin GERD: Continue Protonix  FEN/GI: Regular VTE Prophylaxis: Lovenox  Disposition: Med tele  History of Present Illness:  [name] is a 67 y.o. female presenting with nausea, vomiting, diffuse abdominal pain.  States sx have been ongoing for  the last 2 weeks. States she has been  vomiting all day and has nothing in her stomach. She has also been having diarrhea for the last 1.5 weeks. She has been trying to stay hydrated but only been able to drink a little.   Denies dysuria, back pain. Coughing but no increased cough recently.  Endorses SOB. On 2L [place] at home (just got started on this 5/1).  Prior to starting on oxygen she was just on inhalers.  Feels that her breathing is worse when she gets bad news.  She feels anxious currently. She states she takes hydroxyzine for this.   No heartburn but has been belching often.  In the ED, CXR and CT abdomen and pelvis c/f development of pneumonia.  Patient started on ceftriaxone and azithromycin.  Nausea and vomiting improved with Zofran in the ED. FMTS paged for admission.  Review Of Systems: Per HPI with the following additions: hot flashes, vomiting.   Pertinent Past Medical History: COPD Adenocarcinoma s/p radiation  Bladder cancer s/p TURBT 09/2020 Anxiety  Depression  Remainder reviewed in history tab.   Pertinent Past Surgical History: TURBT  Bronchial biopsies  Wrist arthroplasty   Remainder reviewed in history tab.  Pertinent Social History: Tobacco use: Former. Quit 2 years ago  Alcohol use: Occasionally, last drink on Easter   Other Substance use: None Lives alone.   Pertinent Family History: Mom: HLD, HTN Father: Dementia, lung cancer, Parkinsons Sister: drug overdose  Brother: throat cancer   Remainder reviewed in history tab.   Important Outpatient Medications: Albuterol  Hydroxyzine 50 mg  Alendronate "as needed"  Amlodipine  Atorvastatin  Augmentin: started on Saturday. Took 3 days worth  Baclofen as needed Calcium-Vitamin D Fentanyl patch 25 mcg Flonase as needed Gabapentin PRN restless legs  Zofran  Pantoprazole  MiraLAX   Remainder reviewed in medication history.   Objective: BP (!) 154/74   Pulse (!) 107   Temp 98.2 F (36.8 C)  (Oral)   Resp (!) 31   Ht 5\' 8"  (1.727 m)   Wt 79.2 kg   SpO2 92%   BMI 26.55 kg/m  Exam: General: NAD, chronically ill-appearing, sitting up in bed Eyes: Normal conjunctiva ENTM: Normocephalic, atraumatic head.  Normal external nose and ears.  Moist mucous membranes. Neck: FROM Cardiovascular: Irregular heart rate, no murmurs Respiratory: Slightly diminished breath sounds on right base, referred upper airway noises, no wheezing nor stridor. Gastrointestinal: Soft, nondistended.  No tenderness to palpation with distraction.  Bowel sounds present. Derm: Warm and dry Psych: Anxious affect and mood  Labs:  CBC BMET  Recent Labs  Lab 01/11/23 1719  WBC 16.8*  HGB 13.2  HCT 41.7  PLT 426*   Recent Labs  Lab 01/11/23 1719  NA 137  K 3.1*  CL 100  CO2 27  BUN 16  CREATININE 1.06*  GLUCOSE 97  CALCIUM 8.6*    Lipase: 27   EKG: Sinus tach, no STEMI   Imaging Studies Performed:  DG Chest Portable 1 View Result Date: 01/12/2023 IMPRESSION: 1. Interval development of a small right pleural effusion with overlying opacities in the right base consistent with atelectasis or pneumonia. Clinical correlation and radiographic follow-up recommended. 2. COPD. 3. Aortic atherosclerosis  CT ABDOMEN PELVIS W CONTRAST Result Date: 01/12/2023 IMPRESSION: 1. Wall thickening and stranding at the duodenal bulb and second portion of duodenum, with inflammatory changes at the pancreatic head and uncinate process. Findings could be secondary to pancreatitis and duodenitis. Suggest correlation with enzymes. 2. Incompletely visualized large heterogeneous right hilar and infrahilar mass.  Heterogeneous consolidations and ground-glass disease in the right lower lobe potentially due to postobstructive pneumonia. Large bulla in the right lower lobe now containing fluid level suggesting superimposed infection. 3. Mild right hydronephrosis. Aortic Atherosclerosis (ICD10-I70.0) and Emphysema  (ICD10-J43.9).  Tiffany Kocher, DO 01/12/2023, 2:28 AM PGY-1, [facility]  FPTS Intern pager: [phone], text pages welcome Secure chat group [facility] Teaching Service

## 2023-01-12 NOTE — Progress Notes (Signed)
Echocardiogram 2D Echocardiogram has been performed.  [name] 01/12/2023, 1:09 PM

## 2023-01-12 NOTE — Progress Notes (Signed)
FMTS Interim Progress Note  Admitted after MN, patient seen this AM with Dr. Melissa Noon at bedside. In worsening respiratory distress. Unable to speak in full sentences, reports that she is unable to lay down as it worsens her symptoms. She does have a history of anxiety with recurrent panic attacks. Just received Duonebs that did not help her.   O: BP (!) 146/65 (BP Location: Left Arm)   Pulse 99   Temp 97.6 F (36.4 C) (Oral)   Resp (!) 30   Ht 5\' 8"  (1.727 m)   Wt 79.2 kg   SpO2 99%   BMI 26.55 kg/m   General: sitting upright, anxious, audibly wheezing  Respiratory: Diminished throughout with increased work of breathing Skin: warm and dry, no rashes noted Psych: Normal affect and mood   A/P: Increased to 6L HFNC in ED after desaturation to 70s in room. Deep breathing increased to 90s. Ordered IV steroid, BiPaP trial, stat VBG, placed on progressive, made NPO. Likely multifactorial in nature, high risk patient. Consulted pulmonary as she follows with them outpatient. Patient is DNR. Called nursing for the update.   Alfredo Martinez, MD 01/12/2023, 9:30 AM PGY-2, [facility] Family Medicine Service pager [phone]

## 2023-01-12 NOTE — ED Notes (Signed)
Pt sats in the 50's after coming back from the bathroom, once hooked up to oxygen pt goes back up to 100% on 4lpm.

## 2023-01-12 NOTE — Assessment & Plan Note (Addendum)
Worsening oxygen status overnight and increased WOB requiring BiPap. Rapid response called this AM and stated patient was not oriented. Obtained ABG, pCO2 89 and pO2 54. Upon exam patient resting in NAD, oriented and reported no respiratory distress. Pulmology saw patient this AM and patient opted to become FULL CODE and would like to be intubated. Pulmonology opted for transfer to ICU for intubation. -Further management per CCM

## 2023-01-12 NOTE — Consult Note (Addendum)
Cardiology Consultation   Patient ID: HARTLEY WYKE MRN: [id number]; DOB: 10/29/1955  Admit date: 01/11/2023 Date of Consult: 01/12/2023  PCP:  Fayette Pho, MD    HeartCare Providers Cardiologist:  New (Dr. Antoine Poche)  Patient Profile:   ROGELIO WINBUSH is a 67 y.o. female with a history of stage 3 COPD with centrilobular emphysema followed by Pulmonology, costochondritis, hypertension, anxiety/ depression, bladder cancer s/p TURBT in 09/2020, and lung cancer s/p radiation, and tobacco abuse who is being seen 01/12/2023 for the evaluation of atrial fibrillation with RVR at the request of Dr. Manson Passey.  History of Present Illness:   Ms. Ashton is a 67 year old female with the above history. She has no known prior cardiac history and has never had any cardiac work-up. She does have known stage 3 COPD and well as a history of lung cancer with suspected recurrence based on recent PET scan. She is scheduled for video bronchoscopy with endobronchial ultrasound next week with Dr. Tonia Brooms.  Patient presented to the [facility] ED on 01/11/2023 for diffuse abdominal pain and nausea/ vomiting. However, she also endorsed shortness of breath. Abdominal/ pelvic CT showed wall thickening and stranding at the duodenal bulb and second portion of duodenum with inflammatory changes at the pancreatic head and uncinate process which could be secondary to pancreatitis or duodenitis. CT also showed incompletely visualized large heterogenous right hilar and infrahilar mass with heterogenous consolidations and ground glass disease in the right lower lobe potentially due to post-obstructive pneumonia as well as a large bulla in the right lower lobe containing fluid level suggesting superimposed infection. Chest x-ray showed interval development of a small right pleural effusion with overlying opacities in the right base consistent with atelectasis or pneumonia. WBC 16.8, Hgb 13.2, Plts 426. Na 137, K 3.1, Glucose 97,  BUN 16, Cr 1.06. Total Bili elevated at 1.6 but otherwise LFTs normal. Lipase normal at 27. She was admitted with acute on chronic hypoxic respiratory failure and severe sepsis secondary to likely pneumonia and was started on antibiotics. Pulmonary was consulted for further evaluation of suspected lung cancer recurrence.  Initial EKG last night arrival showed sinus tachycardia. However, repeat EKG early this morning showed atrial fibrillation with rate of 118 bpm. This is a new diagnosis for her. Therefore, Cardiology was consulted for further evaluation. At the time of this evaluation, patient is resting comfortably in no acute distress. She is currently on 6L of HFNC. She states she initially presented to the ED due to poor oral intake and nausea, vomiting, and diarrhea for the past 2 weeks as well as increased shortness of breath. She has chronic dyspnea at baseline and was recently started on 2L of O2 at home. However, she states that it has been getting worse over the last 2 weeks. She does describe having to sleep on a incline and waking up feeling short of breath in the middle of the night. However, it does not sound like this is new. She states she has had to sleep on a incline for years. I suspect this is due to her severe COPD rather than true orthopnea or PND from heart failure. No recent edema. She describes some occasional chest pain only when she takes a deep breath but no exertional shortness of breath. She has palpitations related to her anxiety but nothing outside of these times. No significant lightheadedness, dizziness, or syncope. She denies any fevers. She does describe a productive cough but no nasal congestion. No abnormal bleeding  including hematemesis, hematochezia, melena, or hematuria. Of note, patient has significant anxiety. She had a mild panic attack when I started asking her question about her lung cancer but was able to quickly recover from this.  She is currently back in normal  sinus rhythm with rates in the 90s. Looks like she converted back to sinus rhythm around 5ish this morning.  She does have a history of tobacco use but quit 2 years ago. She has a family history of CAD and states her father had a history of MI. No other known family history of heart disease.   Past Medical History:  Diagnosis Date   Abnormal CT lung screening 07/10/2020   Anxiety    Anxiety state 02/28/2012   GAD7 : 6  PHQ9 : 6   Arthritis    Bladder cancer (HCC) 09/2020   urologist--- dr Berneice Heinrich, incidental finding on PET scan 12/ 2021,  s/p TURBT 09-18-2020 high grade papillary urothelial    Cancer related pain 03/11/2022   CAP (community acquired pneumonia) 04/20/2022   Centrilobular emphysema (HCC)    Chronic left shoulder pain 04/29/2021   Close exposure to COVID-19 virus 05/03/2019   Constipation 04/09/2019   Costochondritis, acute 08/14/2020   DDD (degenerative disc disease), lumbar    Depression    DOE (dyspnea on exertion)    10-29-2020  per pt can do house work without sob, when walks/ stairs stops frequently  (stated recovers quickly when stops)   Emphysema lung (HCC)    Epidermoid cyst 10/29/2018   Recurrent. Located on left hip at iliac crest 2" anterior to midaxillary line. Removed 2013, 2020, 2022.    Epigastric pain 11/16/2020   Fibroid 2003   GERD (gastroesophageal reflux disease)    History of radiation therapy    Left lung- 10/06/20-10/16/20- Dr. Antony Blackbird   Leg cramping 11/13/2019   Lumbar spine pain 01/09/2015   Menopausal vaginal dryness 11/13/2019   Multiple closed fractures of ribs of left side 08/20/2022   Nocturnal enuresis 06/23/2020   Chronic overnight "leaking" of unknown source. Likely urine, but reports no color or odor. Volume enough for liners or light pads. Occurs every night.    Primary adenocarcinoma of lower lobe of left lung (HCC) 07/2020   oncologist--- dr Arbutus Ped---  dx 11/ 2021 bilateral lower lobe masses,  s/p bronchoscopy w/ bx's  07-28-2020 malignancy cells on left ;  completed SBRT bilateral lower lung 10-16-2020 5 fractions   Radiation-induced dermatitis    on 10-29-2020 pt complaint stomach and chest areas,  completed SBRT 10-16-2020   Seasonal allergies    Stage 3 severe COPD by GOLD classification [facility])    pulmonologist--- dr Tonia Brooms,  using anoro inhaler daily   Wears glasses     Past Surgical History:  Procedure Laterality Date   ANTERIOR CERVICAL DECOMP/DISCECTOMY FUSION  03-08-2003 @ [facility]   C3 --- C6   ARTHROTOMY Right 11/09/2015   Procedure: RIGHT WRIST DISTAL RADIAL ULNAR JOINT ARTHROTOMY AND DEBRIDEMENT,  AND ;  Surgeon: Bradly Bienenstock, MD;  Location: [facility] OR;  Service: Orthopedics;  Laterality: Right;   BRONCHIAL BIOPSY  07/28/2020   Procedure: BRONCHIAL BIOPSIES;  Surgeon: Josephine Igo, DO;  Location: [facility];  Service: Pulmonary;;   BRONCHIAL BIOPSY  08/17/2021   Procedure: BRONCHIAL BIOPSIES;  Surgeon: Josephine Igo, DO;  Location: [facility];  Service: Pulmonary;;   BRONCHIAL BRUSHINGS  07/28/2020   Procedure: BRONCHIAL BRUSHINGS;  Surgeon: Josephine Igo, DO;  Location: [facility];  Service: Pulmonary;;  BRONCHIAL BRUSHINGS  08/17/2021   Procedure: BRONCHIAL BRUSHINGS;  Surgeon: Josephine Igo, DO;  Location: [facility];  Service: Pulmonary;;   BRONCHIAL NEEDLE ASPIRATION BIOPSY  07/28/2020   Procedure: BRONCHIAL NEEDLE ASPIRATION BIOPSIES;  Surgeon: Josephine Igo, DO;  Location: [facility];  Service: Pulmonary;;   BRONCHIAL WASHINGS  07/28/2020   Procedure: BRONCHIAL WASHINGS;  Surgeon: Josephine Igo, DO;  Location: [facility];  Service: Pulmonary;;   BRONCHIAL WASHINGS  08/17/2021   Procedure: BRONCHIAL WASHINGS;  Surgeon: Josephine Igo, DO;  Location: [facility];  Service: Pulmonary;;   COLONOSCOPY     CYSTOSCOPY WITH RETROGRADE PYELOGRAM, URETEROSCOPY AND STENT PLACEMENT N/A 09/18/2020   Procedure: CYSTOSCOPY WITH RETROGRADE PYELOGRAM bilateral,URETEROSCOPY AND   STENT PLACEMENT right ureter;  Surgeon: Sebastian Ache, MD;  Location: [facility];  Service: Urology;  Laterality: N/A;  1 HR   HARDWARE REMOVAL Right 07/24/2013   Procedure: RIGHT WRIST HARDWARE REMOVAL, JOINT RELEASE, RIGHT HAND MANIPULATION UNDER ANESTHESIA;  Surgeon: Sharma Covert, MD;  Location: [facility];  Service: Orthopedics;  Laterality: Right;   MASS EXCISION Left 10/14/2021   Procedure: EXCISION LEFT BACK CYSTIC MASS;  Surgeon: Sheliah Hatch, De Blanch, MD;  Location: [facility] [place] [facility];  Service: General;  Laterality: Left;   OPEN REDUCTION INTERNAL FIXATION (ORIF) DISTAL RADIAL FRACTURE Right 11/08/2012   Procedure: OPEN REDUCTION INTERNAL FIXATION (ORIF) RIGHT DISTAL RADIUS FRACTURE;  Surgeon: Sharma Covert, MD;  Location: [facility];  Service: Orthopedics;  Laterality: Right;   TRANSURETHRAL RESECTION OF BLADDER TUMOR N/A 09/18/2020   Procedure: TRANSURETHRAL RESECTION OF BLADDER TUMOR (TURBT);  Surgeon: Sebastian Ache, MD;  Location: [facility];  Service: Urology;  Laterality: N/A;   TRANSURETHRAL RESECTION OF BLADDER TUMOR N/A 11/04/2020   Procedure: RESTAGING TRANSURETHRAL RESECTION OF BLADDER TUMOR (TURBT); BILATERAL RETROGRADE PYELOGRAM;  Surgeon: Sebastian Ache, MD;  Location: [facility];  Service: Urology;  Laterality: N/A;  1 HR   VIDEO BRONCHOSCOPY WITH ENDOBRONCHIAL NAVIGATION N/A 07/28/2020   Procedure: VIDEO BRONCHOSCOPY WITH ENDOBRONCHIAL NAVIGATION;  Surgeon: Josephine Igo, DO;  Location: [facility];  Service: Pulmonary;  Laterality: N/A;   VIDEO BRONCHOSCOPY WITH RADIAL ENDOBRONCHIAL ULTRASOUND  08/17/2021   Procedure: VIDEO BRONCHOSCOPY WITH RADIAL ENDOBRONCHIAL ULTRASOUND;  Surgeon: Josephine Igo, DO;  Location: [facility];  Service: Pulmonary;;   WRIST ARTHROPLASTY Right 11/09/2015   Procedure: OR DISTAL ULNAR RESECTION ARTHROPLASTY ;  Surgeon: Bradly Bienenstock, MD;  Location: [facility];  Service: Orthopedics;  Laterality:  Right;   WRIST ARTHROSCOPY WITH DEBRIDEMENT Right 07/21/2014   Procedure: RIGHT WRIST DISTAL RADIAL ULNAR JOINT DEBRIDEMENT AND JOINT RELEASE POSSIBLE TENDON INTERPOSITION;  Surgeon: Sharma Covert, MD;  Location: [facility];  Service: Orthopedics;  Laterality: Right;     Home Medications:  Prior to Admission medications   Medication Sig Start Date End Date Taking? Authorizing Provider  albuterol (VENTOLIN HFA) 108 (90 Base) MCG/ACT inhaler USE 2 INHALATIONS BY MOUTH  INTO THE LUNGS EVERY 6  HOURS AS NEEDED FOR  SHORTNESS OF BREATH OR  WHEEZING 11/18/22   Fayette Pho, MD  alendronate (FOSAMAX) 70 MG tablet Take 1 tablet (70 mg total) by mouth every 7 (seven) days. Take with a full glass of water on an empty stomach. 12/27/22   Fayette Pho, MD  amLODipine (NORVASC) 10 MG tablet Take 1 tablet (10 mg total) by mouth daily. 10/20/22   Eber Hong, MD  atorvastatin (LIPITOR) 40 MG tablet TAKE 1 TABLET BY MOUTH AT  BEDTIME 07/04/22   Sabino Dick, DO  baclofen (LIORESAL) 10 MG tablet Take 0.5 tablets (5 mg total) by mouth 3 (three) times daily. Patient not taking: Reported on 11/28/2022 06/08/21   Celedonio Savage, MD  Budeson-Glycopyrrol-Formoterol (BREZTRI AEROSPHERE) 160-9-4.8 MCG/ACT AERO Inhale 2 puffs into the lungs in the morning and at bedtime. 11/18/22   Fayette Pho, MD  calcium-vitamin D Ruthell Rummage WITH D) 500-5 MG-MCG tablet Take 1 tablet by mouth daily with breakfast. 10/03/22   Fayette Pho, MD  Cholecalciferol (VITAMIN D) 50 MCG (2000 UT) tablet Take 1 tablet (2,000 Units total) by mouth daily. 10/03/22   Fayette Pho, MD  citalopram (CELEXA) 10 MG tablet Take 1 tablet (10 mg total) by mouth daily. Follow up in 3-4 weeks for side effect check. 12/06/22   Fayette Pho, MD  diclofenac Sodium (VOLTAREN) 1 % GEL Apply 2 g topically 4 (four) times daily. Apply to painful area of ribs. Need to use regularly for relief. 01/19/21   Fayette Pho, MD  fentaNYL (DURAGESIC) 25 MCG/HR Place 1  patch onto the skin every 3 (three) days. 12/26/22   Fayette Pho, MD  fluticasone [facility]) 50 MCG/ACT nasal spray SHAKE LIQUID AND USE 2 SPRAYS IN [facility] NOSTRIL DAILY 05/02/22   Fayette Pho, MD  gabapentin (NEURONTIN) 600 MG tablet Take 1 tablet (600 mg total) by mouth 3 (three) times daily. 08/19/22   Fayette Pho, MD  hydrOXYzine (ATARAX) 50 MG tablet Take 2 tablets (100 mg total) by mouth at bedtime as needed for anxiety or itching (sleep). 11/30/22   Fayette Pho, MD  ibuprofen (ADVIL) 800 MG tablet Take 1 tablet (800 mg total) by mouth every 8 (eight) hours as needed. 10/14/21   Kinsinger, De Blanch, MD  lidocaine (LIDODERM) 5 % Place 1 patch onto the skin daily. Remove & Discard patch within 12 hours or as directed by MD 08/19/22   Fayette Pho, MD  MELATONIN GUMMIES PO Take 10 mg by mouth at bedtime.    [provider]  naloxone [facility]) nasal spray 4 mg/0.1 mL Use for suspected opioid overdose - too sleepy to wake up, difficulty breathing 05/13/22   Fayette Pho, MD  ondansetron (ZOFRAN) 4 MG tablet Take 1 tablet (4 mg total) by mouth every 8 (eight) hours as needed for nausea or vomiting. Take one tablet before starting each prep. 12/14/22   Jenel Lucks, MD  ondansetron (ZOFRAN-ODT) 4 MG disintegrating tablet Take 1 tablet (4 mg total) by mouth every 8 (eight) hours as needed for nausea or vomiting. 01/02/23   Fayette Pho, MD  pantoprazole (PROTONIX) 40 MG tablet Take 1 tablet (40 mg total) by mouth daily as needed (acid indigestion/heartburn.). 05/27/22   Fayette Pho, MD  polyethylene glycol powder [facility]) 17 GM/SCOOP powder Take 17 g by mouth 2 (two) times daily as needed. 05/27/22   Fayette Pho, MD  senna (SENOKOT) 8.6 MG TABS tablet Take 1 tablet (8.6 mg total) by mouth at bedtime. 05/27/22   Fayette Pho, MD  triamcinolone cream (KENALOG) 0.1 % Apply 1 Application topically 2 (two) times daily. 12/06/22   Fayette Pho, MD    Inpatient  Medications: Scheduled Meds:  amLODipine  10 mg Oral Daily   apixaban  5 mg Oral BID   arformoterol  15 mcg Nebulization BID   atorvastatin  40 mg Oral QHS   [START ON 01/13/2023] azithromycin  500 mg Oral Daily   budesonide (PULMICORT) nebulizer solution  0.25 mg Nebulization BID   citalopram  10  mg Oral Daily   ipratropium-albuterol  3 mL Nebulization Q4H   methylPREDNISolone (SOLU-MEDROL) injection  40 mg Intravenous BID   metoprolol tartrate  25 mg Oral BID   revefenacin  175 mcg Nebulization Daily   Continuous Infusions:  cefTRIAXone (ROCEPHIN)  IV     PRN Meds: acetaminophen **OR** acetaminophen, hydrOXYzine, ondansetron (ZOFRAN) IV, pantoprazole  Allergies:    Allergies  Allergen Reactions   Codeine Nausea And Vomiting and Other (See Comments)    Hallucinations   Prednisone Other (See Comments)    'Made me feel funny inside my head"-pt cannot be specific    Social History:   Social History   Socioeconomic History   Marital status: Married    Spouse name: Sherilyn Cooter Pasley   Number of children: 2   Years of education: 12   Highest education level: Not on file  Occupational History   Occupation: Unemployed     Employer:  [employer]    Comment: Sweep   Tobacco Use   Smoking status: Former    Packs/day: 1.00    Years: 50.00    Additional pack years: 0.00    Total pack years: 50.00    Types: Cigarettes    Start date: 04/13/1975    Quit date: 07/14/2020    Years since quitting: 2.4    Passive exposure: Past   Smokeless tobacco: Never  Vaping Use   Vaping Use: Never used  Substance and Sexual Activity   Alcohol use: Yes    Comment: Holidays only   Drug use: Never   Sexual activity: Not Currently    Partners: Male    Birth control/protection: Post-menopausal  Other Topics Concern   Not on file  Social History Narrative   Patient lives alone currently in [place].    Patient is married, they are living separately at this time.    Patient enjoys watching tv,  cooking, and spending time with her children and grandchildren.    Social Determinants of Health   Financial Resource Strain: Low Risk  (12/13/2022)   Overall Financial Resource Strain (CARDIA)    Difficulty of Paying Living Expenses: Not hard at all  Food Insecurity: No Food Insecurity (12/13/2022)   Hunger Vital Sign    Worried About Running Out of Food in the Last Year: Never true    Ran Out of Food in the Last Year: Never true  Transportation Needs: No Transportation Needs (12/13/2022)   PRAPARE - Administrator, Civil Service (Medical): No    Lack of Transportation (Non-Medical): No  Physical Activity: Inactive (12/13/2022)   Exercise Vital Sign    Days of Exercise per Week: 0 days    Minutes of Exercise per Session: 0 min  Stress: No Stress Concern Present (12/13/2022)   [employer] of Occupational Health - Occupational Stress Questionnaire    Feeling of Stress : Not at all  Social Connections: Moderately Integrated (12/13/2022)   Social Connection and Isolation Panel [NHANES]    Frequency of Communication with Friends and Family: More than three times a week    Frequency of Social Gatherings with Friends and Family: More than three times a week    Attends Religious Services: More than 4 times per year    Active Member of [employer] or Organizations: Yes    Attends Banker Meetings: More than 4 times per year    Marital Status: Separated  Intimate Partner Violence: Not At Risk (12/13/2022)   Humiliation, Afraid, Rape, and Kick questionnaire  Fear of Current or Ex-Partner: No    Emotionally Abused: No    Physically Abused: No    Sexually Abused: No    Family History:   Family History  Problem Relation Age of Onset   Hypertension Mother    Hyperlipidemia Mother    Dementia Father    Lung cancer Father    Parkinson's disease Father    Early death Sister 4       Drug overdose   Throat cancer Brother    Hypertension Daughter    Diabetes Daughter       ROS:  Please see the history of present illness.  Review of Systems  Constitutional:  Negative for fever.  HENT:  Negative for congestion.   Respiratory:  Positive for cough and shortness of breath. Negative for sputum production.   Cardiovascular:  Positive for palpitations (during anxiety attacks). Negative for leg swelling.  Gastrointestinal:  Positive for diarrhea, nausea and vomiting. Negative for blood in stool and melena.  Genitourinary:  Negative for hematuria.  Musculoskeletal:  Negative for myalgias.  Neurological:  Negative for dizziness and loss of consciousness.  Endo/Heme/Allergies:  Does not bruise/bleed easily.  Psychiatric/Behavioral:  Substance abuse: prior tobacco abuse. The patient is nervous/anxious.      Physical Exam/Data:   Vitals:   01/12/23 1203 01/12/23 1341 01/12/23 1430 01/12/23 1612  BP:  129/78  122/67  Pulse:  (!) 102  88  Resp:  18  (!) 21  Temp:  98.2 F (36.8 C)  98.2 F (36.8 C)  TempSrc:    Oral  SpO2: 95% 95% 92% 93%  Weight:      Height:       No intake or output data in the 24 hours ending 01/12/23 1624    01/11/2023    4:58 PM 01/09/2023    8:48 AM 01/06/2023    8:58 AM  Last 3 Weights  Weight (lbs) 174 lb 9.7 oz 174 lb 9.6 oz 172 lb  Weight (kg) 79.2 kg 79.198 kg 78.019 kg     Body mass index is 26.55 kg/m.  General: 67 y.o. female resting comfortably in no acute distress. On 6L of HFNC. HEENT: Normocephalic and atraumatic. Sclera clear. Neck: Supple.  No JVD. Heart: RRR. Distinct S1 and S2. No murmurs, gallops, or rubs.  Lungs: No significant increased work of breathing. Diffuse rhonchi bilaterally. No wheezes or rales.  Abdomen: Soft, non-distended, and non-tender to palpation. MSK: Normal strength and tone for age. Extremities: No lower extremity edema.    Skin: Warm and dry. Neuro: Alert and oriented x3. No focal deficits. Psych: Normal affect. Responds appropriately.   EKG:  The following EKGs were personally  reviewed: - Initial EKG on 01/11/2023 showed sinus tachycardia, rate 111 bpm, with minimal ST depression in lateral leads. - Repeat EKG on 01/12/2023 showed atrial fibrillation, rate 118 bpm, with no acute ST/T changes.  Telemetry:  Telemetry was personally reviewed and demonstrates:  Currently in normal sinus rhythm with rates in the the 90s to low 100s.   Relevant CV Studies:  Echo pending.  Laboratory Data:  High Sensitivity Troponin:   Recent Labs  Lab 01/12/23 0245 01/12/23 0536  TROPONINIHS 35* 24*     Chemistry Recent Labs  Lab 01/11/23 1719 01/12/23 1027  NA 137 138  K 3.1* 3.8  CL 100 103  CO2 27 26  GLUCOSE 97 89  BUN 16 13  CREATININE 1.06* 0.90  CALCIUM 8.6* 8.2*  GFRNONAA 58* >60  ANIONGAP  10 9    Recent Labs  Lab 01/11/23 1719  PROT 7.6  ALBUMIN 3.7  AST 15  ALT 23  ALKPHOS 84  BILITOT 1.6*   Lipids No results for input(s): "CHOL", "TRIG", "HDL", "LABVLDL", "LDLCALC", "CHOLHDL" in the last 168 hours.  Hematology Recent Labs  Lab 01/11/23 1719  WBC 16.8*  RBC 4.84  HGB 13.2  HCT 41.7  MCV 86.2  MCH 27.3  MCHC 31.7  RDW 14.6  PLT 426*   Thyroid  Recent Labs  Lab 01/12/23 0245  TSH 2.888    BNPNo results for input(s): "BNP", "PROBNP" in the last 168 hours.  DDimer No results for input(s): "DDIMER" in the last 168 hours.   Radiology/Studies:  ECHOCARDIOGRAM COMPLETE BUBBLE STUDY  Result Date: 01/12/2023    ECHOCARDIOGRAM REPORT   Patient Name:   GWYNNE KEMNITZ [facility] Date of Exam: 01/12/2023 Medical Rec #:  [id number]      Height:       68.0 in Accession #:    [id number]     Weight:       174.6 lb Date of Birth:  December 06, 1955      BSA:          1.929 m Patient Age:    66 years       BP:           150/82 mmHg Patient Gender: F              HR:           101 bpm. Exam Location:  Inpatient Procedure: 2D Echo, Cardiac Doppler, Color Doppler, Saline Contrast Bubble Study            and Intracardiac Opacification Agent Indications:    SOB  History:         Patient has no prior history of Echocardiogram examinations.                 Signs/Symptoms:Shortness of Breath; Risk Factors:Former Smoker.  Sonographer:    Raeford Razor Sonographer#2:  Irving Burton Senior Referring Phys: [phone] CARINA M BROWN  Sonographer Comments: Patient had to be scanned sitting up due to dyspnea, could not tolerate any probe pressure or repositioning. IMPRESSIONS  1. Left ventricular ejection fraction, by estimation, is 50 to 55%. The left ventricle has low normal function. The left ventricle has no regional wall motion abnormalities. Indeterminate diastolic filling due to E-A fusion. There is moderate hypokinesis of the left ventricular, apical apical segment.  2. Right ventricular systolic function is normal. The right ventricular size is normal. There is moderately elevated pulmonary artery systolic pressure. The estimated right ventricular systolic pressure is 46.2 mmHg.  3. A small pericardial effusion is present. The pericardial effusion is circumferential. There is no evidence of cardiac tamponade.  4. The mitral valve is grossly normal. No evidence of mitral valve regurgitation.  5. The aortic valve is tricuspid. Aortic valve regurgitation is not visualized.  6. The inferior vena cava is dilated in size with >50% respiratory variability, suggesting right atrial pressure of 8 mmHg.  7. Agitated saline contrast bubble study was negative, with no evidence of any interatrial shunt. Comparison(s): No prior Echocardiogram. FINDINGS  Left Ventricle: Left ventricular ejection fraction, by estimation, is 50 to 55%. The left ventricle has low normal function. The left ventricle has no regional wall motion abnormalities. Moderate hypokinesis of the left ventricular, apical apical segment. Definity contrast agent was given IV to delineate the left ventricular endocardial borders. The left ventricular  internal cavity size was normal in size. There is no left ventricular hypertrophy. Indeterminate diastolic  filling due to E-A fusion. Right Ventricle: The right ventricular size is normal. No increase in right ventricular wall thickness. Right ventricular systolic function is normal. There is moderately elevated pulmonary artery systolic pressure. The tricuspid regurgitant velocity is 3.09 m/s, and with an assumed right atrial pressure of 8 mmHg, the estimated right ventricular systolic pressure is 46.2 mmHg. Left Atrium: Left atrial size was normal in size. Right Atrium: Right atrial size was normal in size. Pericardium: A small pericardial effusion is present. The pericardial effusion is circumferential. There is no evidence of cardiac tamponade. Mitral Valve: The mitral valve is grossly normal. No evidence of mitral valve regurgitation. Tricuspid Valve: The tricuspid valve is grossly normal. Tricuspid valve regurgitation is trivial. Aortic Valve: The aortic valve is tricuspid. Aortic valve regurgitation is not visualized. Aortic regurgitation PHT measures 379 msec. Pulmonic Valve: The pulmonic valve was normal in structure. Pulmonic valve regurgitation is not visualized. Aorta: The aortic root and ascending aorta are structurally normal, with no evidence of dilitation. Venous: The inferior vena cava is dilated in size with greater than 50% respiratory variability, suggesting right atrial pressure of 8 mmHg. IAS/Shunts: The interatrial septum is aneurysmal. No atrial level shunt detected by color flow Doppler. Agitated saline contrast was given intravenously to evaluate for intracardiac shunting. Agitated saline contrast bubble study was negative, with no evidence of any interatrial shunt.  LEFT VENTRICLE PLAX 2D LVIDd:         4.25 cm LVIDs:         3.25 cm LV PW:         0.90 cm LV IVS:        0.90 cm LVOT diam:     1.80 cm LV SV:         41 LV SV Index:   21 LVOT Area:     2.54 cm  RIGHT VENTRICLE RV S prime:     16.30 cm/s TAPSE (M-mode): 2.4 cm LEFT ATRIUM             Index        RIGHT ATRIUM           Index LA  diam:        2.60 cm 1.35 cm/m   RA Area:     14.40 cm LA Vol (A2C):   43.0 ml 22.29 ml/m  RA Volume:   36.10 ml  18.71 ml/m LA Vol (A4C):   65.3 ml 33.85 ml/m LA Biplane Vol: 55.0 ml 28.51 ml/m  AORTIC VALVE LVOT Vmax:   75.40 cm/s LVOT Vmean:  59.000 cm/s LVOT VTI:    0.161 m AI PHT:      379 msec  AORTA Ao Root diam: 3.70 cm Ao Asc diam:  3.20 cm TRICUSPID VALVE TR Peak grad:   38.2 mmHg TR Vmax:        309.00 cm/s  SHUNTS Systemic VTI:  0.16 m Systemic Diam: 1.80 cm Zoila Shutter MD Electronically signed by Zoila Shutter MD Signature Date/Time: 01/12/2023/4:02:48 PM    Final    DG Chest Portable 1 View  Result Date: 01/12/2023 CLINICAL DATA:  Shortness of breath. EXAM: PORTABLE CHEST 1 VIEW COMPARISON:  Chest CT without contrast 11/24/2022 FINDINGS: The lungs are emphysematous with decreased inspiration. There is interval development of a small right pleural effusion with overlying opacities in the right base consistent with atelectasis or pneumonia. No other focal acute process  is seen. Heart size and vasculature are normal. The mediastinum is normally outlined. There is aortic atherosclerosis. Fusion plating partially visible lower C-spine. IMPRESSION: 1. Interval development of a small right pleural effusion with overlying opacities in the right base consistent with atelectasis or pneumonia. Clinical correlation and radiographic follow-up recommended. 2. COPD. 3. Aortic atherosclerosis. Electronically Signed   By: Almira Bar M.D.   On: 01/12/2023 00:32   CT ABDOMEN PELVIS W CONTRAST  Result Date: 01/12/2023 CLINICAL DATA:  Abdomen pain EXAM: CT ABDOMEN AND PELVIS WITH CONTRAST TECHNIQUE: Multidetector CT imaging of the abdomen and pelvis was performed using the standard protocol following bolus administration of intravenous contrast. RADIATION DOSE REDUCTION: This exam was performed according to the departmental dose-optimization program which includes automated exposure control, adjustment of  the mA and/or kV according to patient size and/or use of iterative reconstruction technique. CONTRAST:  75mL OMNIPAQUE IOHEXOL 350 MG/ML SOLN COMPARISON:  PET CT 12/16/2022, CT 09/19/2020 FINDINGS: Lower chest: Lung bases demonstrate heterogeneous airspace disease in the right lower lobe. Incompletely visualized heterogenous right hilar mass measuring approximately 5.4 by 4.9 cm with encasement of multiple pulmonary vessels. Mild mass effect on the right side of left atrium. Emphysema with large bulla at the right base containing fluid level. Heterogeneous consolidation and airspace disease in the right lower lobe. Band like scarring in the left base. Hepatobiliary: No focal liver abnormality is seen. No gallstones, gallbladder wall thickening, or biliary dilatation. Pancreas: No ductal dilatation. Soft tissue stranding at the pancreatico duodenal groove. Spleen: Normal in size without focal abnormality. Adrenals/Urinary Tract: Adrenal glands are normal. Cyst upper pole right kidney, no imaging follow-up is recommended. Mild right hydronephrosis. Bladder is unremarkable. Stomach/Bowel: The stomach is nonenlarged. Wall thickening and stranding at the duodenal bulb and second portion of duodenum. Negative appendix. Vascular/Lymphatic: Advanced aortic atherosclerosis. No aneurysm. Retroaortic left renal vein. Reproductive: Uterus and bilateral adnexa are unremarkable. Other: Negative for free air.  No evidence for pelvic effusion. Musculoskeletal: No acute or suspicious osseous abnormality. IMPRESSION: 1. Wall thickening and stranding at the duodenal bulb and second portion of duodenum, with inflammatory changes at the pancreatic head and uncinate process. Findings could be secondary to pancreatitis and duodenitis. Suggest correlation with enzymes. 2. Incompletely visualized large heterogeneous right hilar and infrahilar mass. Heterogeneous consolidations and ground-glass disease in the right lower lobe potentially due  to postobstructive pneumonia. Large bulla in the right lower lobe now containing fluid level suggesting superimposed infection. 3. Mild right hydronephrosis. Aortic Atherosclerosis (ICD10-I70.0) and Emphysema (ICD10-J43.9). Electronically Signed   By: Jasmine Pang M.D.   On: 01/12/2023 00:22     Assessment and Plan:   New Onset Atrial Fibrillation with RVR Patient was admitted with acute hypoxic respiratory failure and sepsis secondary to suspected to pneumonia. She was initially in sinus tachycardia on arrival but then went into rapid atrial fibrillation with rates as high as the 130s to 140s. Echo showed LVEF of 50-55% with normal left atrial size. - Currently back in normal sinus rhythm with rates in the 90s. - Potassium initially 3.1 on admission. Repleted and 3.8 today. - TSH normal.  - Will check Magnesium.  - She has been started on Lopressor 25mg  twice daily. Agree with this. - CHA2DS2-VASc = 4 (aortic atherosclerosis noted on CT, HTN, age, female). She has already been started on Eliquis 5mg  twice daily. Agree with this. - Will plan for outpatient monitor to assess atrial fibrillation burden.  Demand Ischemia High sensitivity troponin minimally elevated and down-trending  at 35 >> 24. EKG showed no acute ischemic changes. Echo showed LVEF of 50-55%. There is mention of some moderate hypokinesis in apical segment in the report but Dr. Antoine Poche personally reviewed images and did not notice any significant wall motion abnormalities. - She describes some occasional chest pain but only when she takes a deep breath. No exertional chest pain. Does not sound like angina. - Troponin elevation consistent with demand ischemia in setting of acute on chronic respiratory failure and severe sepsis. Do not think any ischemic evaluation is necessary.  Hypertension Mildly hypertensive at times. - Continue home Amlodipine 10mg  daily. - Continue Lopressor 25mg  twice daily.  Otherwise, per primary  team: - Acute on chronic hypoxic respiratory failure secondary to pneumonia - Severe sepsis - COPD with centrilobular pneumonia - Abdominal pain - Lung cancer with suspected recurrence - Severe anxiety    Risk Assessment/Risk Scores:   CHA2DS2-VASc Score = 4  This indicates a 4.8% annual risk of stroke. The patient's score is based upon: CHF History: 0 HTN History: 1 Diabetes History: 0 Stroke History: 0 Vascular Disease History: 1 Age Score: 1 Gender Score: 1    For questions or updates, please contact [facility] Please consult [email or web address] for contact info under    Signed, Corrin Parker, PA-C  01/12/2023 4:24 PM  History and all data above reviewed.  Patient examined.  I agree with the findings as above.   The patient presented predominantly with abdominal discomfort.  She has been found to have worsening hypoxemia and was seen recently by Dr. Tonia Brooms.  She had history of adenocarcinoma treated but has a PET scan with possible hilar recurrence.  She is going to have trans needle aspiration he had bronchoscopy.  She is admitted with worsening hypoxemia, possible pneumonia, possible sepsis and was in atrial fibrillation with rapid rate.  We are called because of the fibrillation but she really did not feel this.  She has panic attacks has been treated for anxiety over the years.  She is never been diagnosed previously with arrhythmia.  She converted spontaneously to sinus rhythm.  She is actually started on Eliquis.  She is not having any new PND or orthopnea.  Is not having presyncope or syncope.  She denies any chest pain.  The patient exam reveals COR: Regular rate and rhythm, no murmurs,  Lungs: Decreased breath sounds right greater than left,  Abd: Positive bowel sounds normal previously pitch, bruits, rebound, guarding, Ext 2+ pulses, no edema..  All available labs, radiology testing, previous records reviewed. Agree with documented assessment and plan.  Atrial  fibrillation: Paroxysmal.  She has no contraindication to long-term anticoagulation.  CHA2DS2-VASc is as above.  This could be held for bronchoscopy and I did communicate with the pulmonologist.  We will send her a monitor to wear eventually to see the burden of this.  Otherwise no change in therapy at this point.  Elevated troponin the patient does have some elevated enzymes.  However, not suspecting an acute coronary syndrome.  This probably rate related.  No further ischemia evaluation.  Fayrene Fearing Maegen Wigle  6:08 PM  01/12/2023

## 2023-01-12 NOTE — ED Notes (Addendum)
Pt stats 85-86% while sleeping. RT placed pt on [place]ter at 5L, sats now 92%. Per chart keep sats 88-92%

## 2023-01-12 NOTE — Progress Notes (Addendum)
In to see patient this morning with Dr. Jena Gauss.  She was laying in bed, although with moderate increased work of breathing at rest despite having just received ipratropium-albuterol 3 mL  When sitting upright, she desaturated to 70s, while on 5 L HFNC. Increased to 6 L HFNC and encouraged to take rest through her nose with improvement to 95%.  Patient mentating appropriately, able to answer "yes" or "no" questions and says her "breathing feels bad."  PHYSICAL EXAM: General: Awake, alert, oriented CV: Tachycardic to low 100s, regular rhythm Resp: Increased WOB. Lung sounds diminished with wheezing/tightness and reduced air movement. On 5 L HFNC. Can speak in 1-2 word sentences and then needs to take deep breaths Neuro: Awake, alert, oriented. Pupils PERRLA.  ASSESSMENT: 67 year old female with acute hypoxic respiratory failure likely secondary to postobstructive pneumonia in addition to non-small cell lung cancer of left lower lobe.  Hypoxic with going from laying to seated position as well as wheezing and diminished breath sounds on exam.  However, she is hemodynamically stable.  Despite having just received DuoNeb treatment, did not have significant improvement.  Reassuringly, mentating appropriately and she is appropriate candidate for trial of BiPAP.  May have underlying anxiety component, but lung sounds more consistent with acute disease process.  PLAN: Place BiPAP STAT VBG ordered Start IV Solu-Medrol Schedule DuoNebs q4h.  Consult to pulmonary, follows o/p with [place] last seen on 01/09/2023.  Close monitoring.  Darral Dash, DO

## 2023-01-12 NOTE — Progress Notes (Addendum)
Pt assessed for PRN Bipap order. Pt has frequent cough and complains of upper airway tightness. She states her lungs do not feel restricted. Currently on 15L HFNC, Spo2 93%. RN will call if BIPAP needed.

## 2023-01-12 NOTE — Progress Notes (Signed)
RT note. RT called to bedside per RN to place patient on bipap for labored breathing.  Upon arrival patient on 9 L HFNC sat 98-100% with no labored breathing, clear BBS.   RT/Rapid RN able to titrate down to 5 L [place]. Labored breathing increased with audible humming when Dr. Chestine Spore arrived. Per MD lungs sound good, no bipap needed at this time.  Patient left on 5 L [place] with no labored breathing, RT will continue to monitor.    01/12/23 0948  Therapy Vitals  Pulse Rate 92  Resp (!) 24  MEWS Score/Color  MEWS Score 1  MEWS Score Color Green  Respiratory Assessment  Assessment Type Assess only  Respiratory Pattern (S)  Regular;Unlabored  Chest Assessment Chest expansion symmetrical  Bilateral Breath Sounds Clear;Diminished  Oxygen Therapy/Pulse Ox  O2 Device Nasal Cannula  O2 Therapy Oxygen humidified  O2 Flow Rate (L/min) 5 L/min  SpO2 94 %

## 2023-01-12 NOTE — ED Notes (Signed)
Pt moved to room 42, care assumed.

## 2023-01-12 NOTE — TOC Benefit Eligibility Note (Signed)
Patient Advocate Encounter  Insurance verification completed.    The patient is currently admitted and upon discharge could be taking Eliquis 5 mg.  The current 30 day co-pay is $0.00.   The patient is insured through [employer] Medicare Part D   This test claim was processed through [facility]- copay amounts may vary at other pharmacies due to pharmacy/plan contracts, or as the patient moves through the different stages of their insurance plan.  Dove Gresham, CPHT Pharmacy Patient Advocate Specialist [facility] Patient Advocate Team Direct Number: [phone]  Fax: [phone]       

## 2023-01-12 NOTE — ED Provider Notes (Signed)
Accepted handoff at shift change from  PA-C. Please see prior provider note for more detail.   Briefly: Patient is 67 y.o.       Plan: FU on imaging       Physical Exam  BP (!) 154/74   Pulse (!) 107   Temp 97.9 F (36.6 C) (Oral)   Resp (!) 31   Ht 5\' 8"  (1.727 m)   Wt 79.2 kg   SpO2 92%   BMI 26.55 kg/m   Physical Exam Vitals and nursing note reviewed.  Constitutional:      Appearance: She is ill-appearing.  HENT:     Head: Normocephalic and atraumatic.     Nose: Nose normal.     Mouth/Throat:     Mouth: Mucous membranes are moist.  Eyes:     General: No scleral icterus. Cardiovascular:     Rate and Rhythm: Normal rate and regular rhythm.     Pulses: Normal pulses.     Heart sounds: Normal heart sounds.  Pulmonary:     Effort: Pulmonary effort is normal. No respiratory distress.     Breath sounds: No wheezing.     Comments: Decreased lung sounds in bases  Abdominal:     Palpations: Abdomen is soft.     Tenderness: There is abdominal tenderness.  Musculoskeletal:     Cervical back: Normal range of motion.     Right lower leg: No edema.     Left lower leg: No edema.  Skin:    General: Skin is warm and dry.     Capillary Refill: Capillary refill takes less than 2 seconds.  Neurological:     Mental Status: She is alert. Mental status is at baseline.  Psychiatric:        Mood and Affect: Mood normal.        Behavior: Behavior normal.     Procedures  .Critical Care  Performed by: Gailen Shelter, PA Authorized by: Gailen Shelter, PA   Critical care provider statement:    Critical care time (minutes):  35   Critical care time was exclusive of:  Separately billable procedures and treating other patients and teaching time   Critical care was necessary to treat or prevent imminent or life-threatening deterioration of the following conditions:  Sepsis   Critical care was time spent personally by me on the following activities:  Development of  treatment plan with patient or surrogate, review of old charts, re-evaluation of patient's condition, pulse oximetry, ordering and review of radiographic studies, ordering and review of laboratory studies, ordering and performing treatments and interventions, obtaining history from patient or surrogate, examination of patient and evaluation of patient's response to treatment   Care discussed with: admitting provider    Results for orders placed or performed during the hospital encounter of 01/11/23  Lipase, blood  Result Value Ref Range   Lipase 27 11 - 51 U/L  Comprehensive metabolic panel  Result Value Ref Range   Sodium 137 135 - 145 mmol/L   Potassium 3.1 (L) 3.5 - 5.1 mmol/L   Chloride 100 98 - 111 mmol/L   CO2 27 22 - 32 mmol/L   Glucose, Bld 97 70 - 99 mg/dL   BUN 16 8 - 23 mg/dL   Creatinine, Ser 4.13 (H) 0.44 - 1.00 mg/dL   Calcium 8.6 (L) 8.9 - 10.3 mg/dL   Total Protein 7.6 6.5 - 8.1 g/dL   Albumin 3.7 3.5 - 5.0 g/dL   AST 15 15 -  41 U/L   ALT 23 0 - 44 U/L   Alkaline Phosphatase 84 38 - 126 U/L   Total Bilirubin 1.6 (H) 0.3 - 1.2 mg/dL   GFR, Estimated 58 (L) >60 mL/min   Anion gap 10 5 - 15  CBC  Result Value Ref Range   WBC 16.8 (H) 4.0 - 10.5 K/uL   RBC 4.84 3.87 - 5.11 MIL/uL   Hemoglobin 13.2 12.0 - 15.0 g/dL   HCT 69.6 29.5 - 28.4 %   MCV 86.2 80.0 - 100.0 fL   MCH 27.3 26.0 - 34.0 pg   MCHC 31.7 30.0 - 36.0 g/dL   RDW 13.2 44.0 - 10.2 %   Platelets 426 (H) 150 - 400 K/uL   nRBC 0.0 0.0 - 0.2 %  Urinalysis, Routine w reflex microscopic -Urine, Clean Catch  Result Value Ref Range   Color, Urine YELLOW YELLOW   APPearance HAZY (A) CLEAR   Specific Gravity, Urine 1.016 1.005 - 1.030   pH 5.0 5.0 - 8.0   Glucose, UA NEGATIVE NEGATIVE mg/dL   Hgb urine dipstick SMALL (A) NEGATIVE   Bilirubin Urine NEGATIVE NEGATIVE   Ketones, ur NEGATIVE NEGATIVE mg/dL   Protein, ur 30 (A) NEGATIVE mg/dL   Nitrite NEGATIVE NEGATIVE   Leukocytes,Ua TRACE (A) NEGATIVE    RBC / HPF 0-5 0 - 5 RBC/hpf   WBC, UA 11-20 0 - 5 WBC/hpf   Bacteria, UA RARE (A) NONE SEEN   Squamous Epithelial / HPF 11-20 0 - 5 /HPF   Mucus PRESENT   Lactic acid, plasma  Result Value Ref Range   Lactic Acid, Venous 0.8 0.5 - 1.9 mmol/L  TSH  Result Value Ref Range   TSH 2.888 0.350 - 4.500 uIU/mL  Troponin I (High Sensitivity)  Result Value Ref Range   Troponin I (High Sensitivity) 35 (H) <18 ng/L  Troponin I (High Sensitivity)  Result Value Ref Range   Troponin I (High Sensitivity) 24 (H) <18 ng/L   DG Chest Portable 1 View  Result Date: 01/12/2023 CLINICAL DATA:  Shortness of breath. EXAM: PORTABLE CHEST 1 VIEW COMPARISON:  Chest CT without contrast 11/24/2022 FINDINGS: The lungs are emphysematous with decreased inspiration. There is interval development of a small right pleural effusion with overlying opacities in the right base consistent with atelectasis or pneumonia. No other focal acute process is seen. Heart size and vasculature are normal. The mediastinum is normally outlined. There is aortic atherosclerosis. Fusion plating partially visible lower C-spine. IMPRESSION: 1. Interval development of a small right pleural effusion with overlying opacities in the right base consistent with atelectasis or pneumonia. Clinical correlation and radiographic follow-up recommended. 2. COPD. 3. Aortic atherosclerosis. Electronically Signed   By: Almira Bar M.D.   On: 01/12/2023 00:32   CT ABDOMEN PELVIS W CONTRAST  Result Date: 01/12/2023 CLINICAL DATA:  Abdomen pain EXAM: CT ABDOMEN AND PELVIS WITH CONTRAST TECHNIQUE: Multidetector CT imaging of the abdomen and pelvis was performed using the standard protocol following bolus administration of intravenous contrast. RADIATION DOSE REDUCTION: This exam was performed according to the departmental dose-optimization program which includes automated exposure control, adjustment of the mA and/or kV according to patient size and/or use of iterative  reconstruction technique. CONTRAST:  75mL OMNIPAQUE IOHEXOL 350 MG/ML SOLN COMPARISON:  PET CT 12/16/2022, CT 09/19/2020 FINDINGS: Lower chest: Lung bases demonstrate heterogeneous airspace disease in the right lower lobe. Incompletely visualized heterogenous right hilar mass measuring approximately 5.4 by 4.9 cm with encasement of multiple pulmonary  vessels. Mild mass effect on the right side of left atrium. Emphysema with large bulla at the right base containing fluid level. Heterogeneous consolidation and airspace disease in the right lower lobe. Band like scarring in the left base. Hepatobiliary: No focal liver abnormality is seen. No gallstones, gallbladder wall thickening, or biliary dilatation. Pancreas: No ductal dilatation. Soft tissue stranding at the pancreatico duodenal groove. Spleen: Normal in size without focal abnormality. Adrenals/Urinary Tract: Adrenal glands are normal. Cyst upper pole right kidney, no imaging follow-up is recommended. Mild right hydronephrosis. Bladder is unremarkable. Stomach/Bowel: The stomach is nonenlarged. Wall thickening and stranding at the duodenal bulb and second portion of duodenum. Negative appendix. Vascular/Lymphatic: Advanced aortic atherosclerosis. No aneurysm. Retroaortic left renal vein. Reproductive: Uterus and bilateral adnexa are unremarkable. Other: Negative for free air.  No evidence for pelvic effusion. Musculoskeletal: No acute or suspicious osseous abnormality. IMPRESSION: 1. Wall thickening and stranding at the duodenal bulb and second portion of duodenum, with inflammatory changes at the pancreatic head and uncinate process. Findings could be secondary to pancreatitis and duodenitis. Suggest correlation with enzymes. 2. Incompletely visualized large heterogeneous right hilar and infrahilar mass. Heterogeneous consolidations and ground-glass disease in the right lower lobe potentially due to postobstructive pneumonia. Large bulla in the right lower lobe  now containing fluid level suggesting superimposed infection. 3. Mild right hydronephrosis. Aortic Atherosclerosis (ICD10-I70.0) and Emphysema (ICD10-J43.9). Electronically Signed   By: Jasmine Pang M.D.   On: 01/12/2023 00:22   NM PET Image Restag (PS) Skull Base To Thigh  Result Date: 12/16/2022 CLINICAL DATA:  Subsequent treatment strategy for lung cancer . EXAM: NUCLEAR MEDICINE PET SKULL BASE TO THIGH TECHNIQUE: 8.95 mCi F-18 FDG was injected intravenously. Full-ring PET imaging was performed from the skull base to thigh after the radiotracer. CT data was obtained and used for attenuation correction and anatomic localization. Fasting blood glucose: 95 mg/dl COMPARISON:  CT chest 16/06/9603, remote PET-CT from 08/17/2020 FINDINGS: Mediastinal blood pool activity: SUV max 2.56 Liver activity: SUV max NA NECK: No hypermetabolic lymph nodes in the neck. Incidental CT findings: None. CHEST: Left pre-vascular nodal mass is tracer avid measuring 3.1 cm with SUV max of 15.37, image 57/3. On the previous exam this measured 2.7 cm. Anterior mediastinal/retrosternal lymph node measures 0.9 x 1.5 cm with SUV max of 10.06, image 65/3. Previously this measured 1.3 x 0.8 cm. Tracer avid right paratracheal lymph node measures 1 cm with SUV max of 7.31, image 60/4. On the previous exam this measured the 1 cm. Tracer avid right hilar lymph node has an SUV max of 16.66, image 69 of the fused PET-CT images. Difficult to see on previous unenhanced CT of the chest. Tracer avid tumor within the right hilum measures approximately 3.7 x 2.8 cm and has an SUV max of 21.79, image 75/3. On the exam from 11/24/2022 this lesion is difficult to see due to lack of intravenous contrast material but measured approximately 3.0 by 1.7 cm. Mild low level tracer uptake is associated with the area post treatment change involving the superior segment of left lower lobe with SUV max of 2.90, image 70/3. Adjacent displaced rib fractures posterior  to this area are similar to the previous exam. The perifissural nodule in the along the major fissure is again seen measuring 8 mm with SUV max 1.25, image 73/3. Previously this was measured at 7 mm. Nodule in the posterior right lung base measures 7 mm without corresponding increased uptake, image 90/3. Previously 6 mm. Incidental CT findings:  Moderate to severe paraseptal and centrilobular emphysema with bullous changes in the periphery of the right lower lobe. Aortic atherosclerosis. Coronary artery calcification. ABDOMEN/PELVIS: No abnormal hypermetabolic activity within the liver, pancreas, adrenal glands, or spleen. No hypermetabolic lymph nodes in the abdomen or pelvis. Incidental CT findings: Upper pole right kidney cyst measures 4.6 cm without corresponding uptake. No follow-up imaging recommended. Aortic atherosclerotic calcifications. There is right-sided hydronephrosis with dilatation of the right renal collecting system to the UPJ. Normal caliber right ureter. No obstructing stone noted. SKELETON: No focal hypermetabolic activity to suggest skeletal metastasis. Incidental CT findings: None. IMPRESSION: 1. Interval progression of disease. 2. There is a tracer avid mass within the right hilar region. This was not reported on the previous exam likely reflecting lack of intravenous contrast material limiting assessment of the hilar regions. Also not reported is an adjacent FDG avid right hilar lymph node. 3. Similar size of previously reported anterior mediastinal, pre-vascular, and right paratracheal lymph nodes. These are tracer avid and compatible with metabolically active nodal metastasis. 4. Similar appearance of post treatment change involving the superior segment of left lower lobe. No specific findings identified to suggest locally recurrent FDG avid tumor in the superior segment of the left lower lobe. 5. Small right lung nodules are not significantly changed in size from the previous exam. 6.  Right-sided hydronephrosis with dilatation of the right renal collecting system to the UPJ. No obstructing stone noted. 7. Aortic Atherosclerosis (ICD10-I70.0) and Emphysema (ICD10-J43.9). Electronically Signed   By: Signa Kell M.D.   On: 12/16/2022 12:24    ED Course / MDM   Clinical Course as of 01/12/23 0659  Wed Jan 11, 2023  3662 67 year old waiting for CT AP O2? Two weeks of NVD -  [WF]  Thu Jan 12, 2023  0032 IMPRESSION: 1. Wall thickening and stranding at the duodenal bulb and second portion of duodenum, with inflammatory changes at the pancreatic head and uncinate process. Findings could be secondary to pancreatitis and duodenitis. Suggest correlation with enzymes. 2. Incompletely visualized large heterogeneous right hilar and infrahilar mass. Heterogeneous consolidations and ground-glass disease in the right lower lobe potentially due to postobstructive pneumonia. Large bulla in the right lower lobe now containing fluid level suggesting superimposed infection. 3. Mild right hydronephrosis.  Aortic Atherosclerosis (ICD10-I70.0) and Emphysema (ICD10-J43.9).   Electronically Signed   By: Jasmine Pang M.D.   On: 01/12/2023 00:22   [WF]  1610 Patient with CT abdomen pelvis showing some inflammatory changes around the pancreas concerning for pancreatitis.  She is having abdominal pain.  Furthermore she is tachypneic and has evidence of what appears to be a postobstructive pneumonia distal to the site of her right lower lobe cancer.  Will initiate antibiotics and hydrate and treat her pain.  Will admit to the hospital. [WF]  0122 Discussed with family med teaching service.  [WF]    Clinical Course User Index [WF] Gailen Shelter, [place]   Medical Decision Making Amount and/or Complexity of Data Reviewed Labs: ordered. Radiology: ordered.  Risk Prescription drug management. Decision regarding hospitalization.   This patient presents to the ED for concern of abd pain,  this involves a number of treatment options, and is a complaint that carries with it a moderate to high risk of complications and morbidity. A differential diagnosis was considered for the patient's symptoms which is discussed below:   The causes of generalized abdominal pain include but are not limited to AAA, mesenteric ischemia, appendicitis, diverticulitis, DKA,  gastritis, gastroenteritis, AMI, nephrolithiasis, pancreatitis, peritonitis, adrenal insufficiency,lead poisoning, iron toxicity, intestinal ischemia, constipation, UTI,SBO/LBO, splenic rupture, biliary disease, IBD, IBS, PUD, or hepatitis.   Co morbidities: Discussed in HPI   Brief History:  Pt with lung cancer with coughing and shortness of breath as well as abdominal pain.  She has known lung cancer has been coughing more frequently no hemoptysis or chest pain.    EMR reviewed including pt PMHx, past surgical history and past visits to ER.   See HPI for more details   Lab Tests:   I ordered and independently interpreted labs. Labs notable for Significant leukocytosis of 16.8, CMP unremarkable troponin flat   Imaging Studies:  Abnormal findings. I personally reviewed all imaging studies. Imaging notable for  IMPRESSION:  1. Wall thickening and stranding at the duodenal bulb and second  portion of duodenum, with inflammatory changes at the pancreatic  head and uncinate process. Findings could be secondary to  pancreatitis and duodenitis. Suggest correlation with enzymes.  2. Incompletely visualized large heterogeneous right hilar and  infrahilar mass. Heterogeneous consolidations and ground-glass  disease in the right lower lobe potentially due to postobstructive  pneumonia. Large bulla in the right lower lobe now containing fluid  level suggesting superimposed infection.  3. Mild right hydronephrosis.   IMPRESSION:  1. Interval development of a small right pleural effusion with  overlying opacities in the  right base consistent with atelectasis or  pneumonia. Clinical correlation and radiographic follow-up  recommended.  2. COPD.  3. Aortic atherosclerosis.     Cardiac Monitoring:  The patient was maintained on a cardiac monitor.  I personally viewed and interpreted the cardiac monitored which showed an underlying rhythm of: tachycardia     Medicines ordered:  I ordered medication including Rocephin, azithromycin, Dilaudid for pain, crystalloid for hydration Reevaluation of the patient after these medicines showed that the patient improved I have reviewed the patients home medicines and have made adjustments as needed   Critical Interventions:   antibiotics and admission   Consults/Attending Physician    discussed with family medicine teaching service who will admit   Reevaluation:  After the interventions noted above I re-evaluated patient and found that they have :improved   Social Determinants of Health:      Problem List / ED Course:  Patient with postobstructive pneumonia distal to her known cancer.  Also with CT evidence of pancreatitis she does not have a history of this.  Lipase is normal however consistent with patient's history and physical exam.  Patient mid to hospital service.   Dispostion:  After consideration of the diagnostic results and the patients response to treatment, I feel that the patent would benefit from admission   Gailen Shelter, [place] 01/12/23 0710    Shon Baton, MD 01/14/23 [phone]

## 2023-01-12 NOTE — Progress Notes (Signed)
[name] was seen and examined at bedside with RR nurse and RT. Weaned down to 4L O2 from 9L upon arrival to the unit. She endorses anxiety driving a significant part of her respiratory symptoms.   BP (!) 146/65 (BP Location: Left Arm)   Pulse 92   Temp 97.6 F (36.4 C) (Oral)   Resp (!) 23   Ht 5\' 8"  (1.727 m)   Wt 79.2 kg   SpO2 96%   BMI 26.55 kg/m  Chronically ill appearing woman sitting up in bed in NAD [place]/AT, eyes anicteric Upper airway inspiratory and expiratory stridor started after I came into the room and increased when I examined her but resolved while we were talking, then varied in intensity again after.  No tachypnea or accessory muscle use. Rhales in R base, no wheezing.  S1S2, RRR Abd soft, NT No peripheral edema  A&P: RLL post-obstructive pneumonia VCD due to anxiety, chronic obstructive lung disease COPD -Antibiotics -Escalate anxiety regimen; often times OP CBT with therapist helps resolve VCD -Would stabilize her and plan to complete EBUS on 5/7 as planned  -O2 as required to maintain SpO2 >90%; recently started on 2L at home. No benefit from BiPAP at this time.  -resume triple inhaled therapy; on Breztri at home     Full consult note to follow.   Laura Dunn, DO 01/12/23 9:47 AM [facility]  For contact information, see Amion. If no response to pager, please call PCCM consult pager. After hours, 7PM- 7AM, please call Elink.

## 2023-01-12 NOTE — ED Notes (Signed)
ED TO INPATIENT HANDOFF REPORT  ED Nurse Name and Phone #: Osvaldo Shipper RN [phone]  S Name/Age/Gender [name] 67 y.o. female Room/Bed: 042C/042C  Code Status   Code Status: DNR  Home/SNF/Other Home Patient oriented to: self, place, time, and situation Is this baseline? Yes   Triage Complete: Triage complete  Chief Complaint Acute hypoxic respiratory failure (HCC) [J96.01]  Triage Note The pt reports that she has not been able to eat for 2 weeks and she has been nauseated but nothing comes up  she saw a doctor yesterday in the office  that told her to come today     she had a c-t of her lungs yesterday    she has a history of anxiety   Allergies Allergies  Allergen Reactions   Codeine Nausea And Vomiting and Other (See Comments)    Hallucinations   Prednisone Other (See Comments)    'Made me feel funny inside my head"-pt cannot be specific    Level of Care/Admitting Diagnosis ED Disposition     ED Disposition  Admit   Condition  --   Comment  Hospital Area: [facility] [[id number]]  Level of Care: Telemetry Medical [[id number]]  May place patient in observation at [facility] or [facility] if equivalent level of care is available:: No  Covid Evaluation: Asymptomatic - no recent exposure (last 10 days) testing not required  Diagnosis: Acute hypoxic respiratory failure [facility]) [[id number]]  Admitting Physician: Tiffany Kocher [[id number]]  Attending Physician: Westley Chandler [[id number]]          B Medical/Surgery History Past Medical History:  Diagnosis Date   Abnormal CT lung screening 07/10/2020   Anxiety    Anxiety state 02/28/2012   GAD7 : 6  PHQ9 : 6   Arthritis    Bladder cancer (HCC) 09/2020   urologist--- dr Berneice Heinrich, incidental finding on PET scan 12/ 2021,  s/p TURBT 09-18-2020 high grade papillary urothelial    Cancer related pain 03/11/2022   CAP (community acquired pneumonia) 04/20/2022   Centrilobular emphysema (HCC)    Chronic left  shoulder pain 04/29/2021   Close exposure to COVID-19 virus 05/03/2019   Constipation 04/09/2019   Costochondritis, acute 08/14/2020   DDD (degenerative disc disease), lumbar    Depression    DOE (dyspnea on exertion)    10-29-2020  per pt can do house work without sob, when walks/ stairs stops frequently  (stated recovers quickly when stops)   Emphysema lung (HCC)    Epidermoid cyst 10/29/2018   Recurrent. Located on left hip at iliac crest 2" anterior to midaxillary line. Removed 2013, 2020, 2022.    Epigastric pain 11/16/2020   Fibroid 2003   GERD (gastroesophageal reflux disease)    History of radiation therapy    Left lung- 10/06/20-10/16/20- Dr. Antony Blackbird   Leg cramping 11/13/2019   Lumbar spine pain 01/09/2015   Menopausal vaginal dryness 11/13/2019   Multiple closed fractures of ribs of left side 08/20/2022   Nocturnal enuresis 06/23/2020   Chronic overnight "leaking" of unknown source. Likely urine, but reports no color or odor. Volume enough for liners or light pads. Occurs every night.    Primary adenocarcinoma of lower lobe of left lung [facility]) 07/2020   oncologist--- dr Arbutus Ped---  dx 11/ 2021 bilateral lower lobe masses,  s/p bronchoscopy w/ bx's 07-28-2020 malignancy cells on left ;  completed SBRT bilateral lower lung 10-16-2020 5 fractions   Radiation-induced dermatitis    on 10-29-2020 pt complaint  stomach and chest areas,  completed SBRT 10-16-2020   Seasonal allergies    Stage 3 severe COPD by GOLD classification [facility])    pulmonologist--- dr Tonia Brooms,  using anoro inhaler daily   Wears glasses    Past Surgical History:  Procedure Laterality Date   ANTERIOR CERVICAL DECOMP/DISCECTOMY FUSION  03-08-2003 @ [facility]   C3 --- C6   ARTHROTOMY Right 11/09/2015   Procedure: RIGHT WRIST DISTAL RADIAL ULNAR JOINT ARTHROTOMY AND DEBRIDEMENT,  AND ;  Surgeon: Bradly Bienenstock, MD;  Location: [facility] OR;  Service: Orthopedics;  Laterality: Right;   BRONCHIAL BIOPSY  07/28/2020   Procedure:  BRONCHIAL BIOPSIES;  Surgeon: Josephine Igo, DO;  Location: [facility] ENDOSCOPY;  Service: Pulmonary;;   BRONCHIAL BIOPSY  08/17/2021   Procedure: BRONCHIAL BIOPSIES;  Surgeon: Josephine Igo, DO;  Location: [facility];  Service: Pulmonary;;   BRONCHIAL BRUSHINGS  07/28/2020   Procedure: BRONCHIAL BRUSHINGS;  Surgeon: Josephine Igo, DO;  Location: [facility];  Service: Pulmonary;;   BRONCHIAL BRUSHINGS  08/17/2021   Procedure: BRONCHIAL BRUSHINGS;  Surgeon: Josephine Igo, DO;  Location: [facility];  Service: Pulmonary;;   BRONCHIAL NEEDLE ASPIRATION BIOPSY  07/28/2020   Procedure: BRONCHIAL NEEDLE ASPIRATION BIOPSIES;  Surgeon: Josephine Igo, DO;  Location: [facility];  Service: Pulmonary;;   BRONCHIAL WASHINGS  07/28/2020   Procedure: BRONCHIAL WASHINGS;  Surgeon: Josephine Igo, DO;  Location: [facility];  Service: Pulmonary;;   BRONCHIAL WASHINGS  08/17/2021   Procedure: BRONCHIAL WASHINGS;  Surgeon: Josephine Igo, DO;  Location: [facility];  Service: Pulmonary;;   COLONOSCOPY     CYSTOSCOPY WITH RETROGRADE PYELOGRAM, URETEROSCOPY AND STENT PLACEMENT N/A 09/18/2020   Procedure: CYSTOSCOPY WITH RETROGRADE PYELOGRAM bilateral,URETEROSCOPY AND  STENT PLACEMENT right ureter;  Surgeon: Sebastian Ache, MD;  Location: [facility]S;  Service: Urology;  Laterality: N/A;  1 HR   HARDWARE REMOVAL Right 07/24/2013   Procedure: RIGHT WRIST HARDWARE REMOVAL, JOINT RELEASE, RIGHT HAND MANIPULATION UNDER ANESTHESIA;  Surgeon: Sharma Covert, MD;  Location: [facility];  Service: Orthopedics;  Laterality: Right;   MASS EXCISION Left 10/14/2021   Procedure: EXCISION LEFT BACK CYSTIC MASS;  Surgeon: Sheliah Hatch, De Blanch, MD;  Location: [facility];  Service: General;  Laterality: Left;   OPEN REDUCTION INTERNAL FIXATION (ORIF) DISTAL RADIAL FRACTURE Right 11/08/2012   Procedure: OPEN REDUCTION INTERNAL FIXATION (ORIF) RIGHT DISTAL RADIUS FRACTURE;  Surgeon: Sharma Covert, MD;  Location: [facility];  Service: Orthopedics;  Laterality: Right;   TRANSURETHRAL RESECTION OF BLADDER TUMOR N/A 09/18/2020   Procedure: TRANSURETHRAL RESECTION OF BLADDER TUMOR (TURBT);  Surgeon: Sebastian Ache, MD;  Location: [facility];  Service: Urology;  Laterality: N/A;   TRANSURETHRAL RESECTION OF BLADDER TUMOR N/A 11/04/2020   Procedure: RESTAGING TRANSURETHRAL RESECTION OF BLADDER TUMOR (TURBT); BILATERAL RETROGRADE PYELOGRAM;  Surgeon: Sebastian Ache, MD;  Location: [facility];  Service: Urology;  Laterality: N/A;  1 HR   VIDEO BRONCHOSCOPY WITH ENDOBRONCHIAL NAVIGATION N/A 07/28/2020   Procedure: VIDEO BRONCHOSCOPY WITH ENDOBRONCHIAL NAVIGATION;  Surgeon: Josephine Igo, DO;  Location: [facility];  Service: Pulmonary;  Laterality: N/A;   VIDEO BRONCHOSCOPY WITH RADIAL ENDOBRONCHIAL ULTRASOUND  08/17/2021   Procedure: VIDEO BRONCHOSCOPY WITH RADIAL ENDOBRONCHIAL ULTRASOUND;  Surgeon: Josephine Igo, DO;  Location: [facility];  Service: Pulmonary;;   WRIST ARTHROPLASTY Right 11/09/2015   Procedure: OR DISTAL ULNAR RESECTION ARTHROPLASTY ;  Surgeon: Bradly Bienenstock, MD;  Location: [facility] OR;  Service: Orthopedics;  Laterality: Right;   WRIST ARTHROSCOPY WITH DEBRIDEMENT Right 07/21/2014   Procedure: RIGHT WRIST DISTAL RADIAL ULNAR JOINT DEBRIDEMENT AND JOINT RELEASE POSSIBLE TENDON INTERPOSITION;  Surgeon: Sharma Covert, MD;  Location: [facility] OR;  Service: Orthopedics;  Laterality: Right;     A IV Location/Drains/Wounds Patient Lines/Drains/Airways Status     Active Line/Drains/Airways     Name Placement date Placement time Site Days   Peripheral IV 01/11/23 22 G Left Antecubital 01/11/23  1713  Antecubital  1            Intake/Output Last 24 hours No intake or output data in the 24 hours ending 01/12/23 0803  Labs/Imaging Results for orders placed or performed during the hospital encounter of 01/11/23 (from the past 48 hour(s))  Lipase,  blood     Status: None   Collection Time: 01/11/23  5:19 PM  Result Value Ref Range   Lipase 27 11 - 51 U/L    Comment: Performed at [facility], [address] [place], [place] [address]  Comprehensive metabolic panel     Status: Abnormal   Collection Time: 01/11/23  5:19 PM  Result Value Ref Range   Sodium 137 135 - 145 mmol/L   Potassium 3.1 (L) 3.5 - 5.1 mmol/L   Chloride 100 98 - 111 mmol/L   CO2 27 22 - 32 mmol/L   Glucose, Bld 97 70 - 99 mg/dL    Comment: Glucose reference range applies only to samples taken after fasting for at least 8 hours.   BUN 16 8 - 23 mg/dL   Creatinine, Ser 0.98 (H) 0.44 - 1.00 mg/dL   Calcium 8.6 (L) 8.9 - 10.3 mg/dL   Total Protein 7.6 6.5 - 8.1 g/dL   Albumin 3.7 3.5 - 5.0 g/dL   AST 15 15 - 41 U/L   ALT 23 0 - 44 U/L   Alkaline Phosphatase 84 38 - 126 U/L   Total Bilirubin 1.6 (H) 0.3 - 1.2 mg/dL   GFR, Estimated 58 (L) >60 mL/min    Comment: (NOTE) Calculated using the CKD-EPI Creatinine Equation (2021)    Anion gap 10 5 - 15    Comment: Performed at [facility], [address] [place], [place] [address]  CBC     Status: Abnormal   Collection Time: 01/11/23  5:19 PM  Result Value Ref Range   WBC 16.8 (H) 4.0 - 10.5 K/uL   RBC 4.84 3.87 - 5.11 MIL/uL   Hemoglobin 13.2 12.0 - 15.0 g/dL   HCT 78.2 95.6 - 21.3 %   MCV 86.2 80.0 - 100.0 fL   MCH 27.3 26.0 - 34.0 pg   MCHC 31.7 30.0 - 36.0 g/dL   RDW 08.6 57.8 - 46.9 %   Platelets 426 (H) 150 - 400 K/uL   nRBC 0.0 0.0 - 0.2 %    Comment: Performed at [facility], [address], [place], [place] [address]  Urinalysis, Routine w reflex microscopic -Urine, Clean Catch     Status: Abnormal   Collection Time: 01/11/23  5:40 PM  Result Value Ref Range   Color, Urine YELLOW YELLOW   APPearance HAZY (A) CLEAR   Specific Gravity, Urine 1.016 1.005 - 1.030   pH 5.0 5.0 - 8.0   Glucose, UA NEGATIVE NEGATIVE mg/dL   Hgb urine dipstick SMALL (A) NEGATIVE   Bilirubin Urine  NEGATIVE NEGATIVE   Ketones, ur NEGATIVE NEGATIVE mg/dL   Protein, ur 30 (A) NEGATIVE  mg/dL   Nitrite NEGATIVE NEGATIVE   Leukocytes,Ua TRACE (A) NEGATIVE   RBC / HPF 0-5 0 - 5 RBC/hpf   WBC, UA 11-20 0 - 5 WBC/hpf   Bacteria, UA RARE (A) NONE SEEN   Squamous Epithelial / HPF 11-20 0 - 5 /HPF   Mucus PRESENT     Comment: Performed at [facility], [address], [place], [place] [address]  Lactic acid, plasma     Status: None   Collection Time: 01/12/23 12:50 AM  Result Value Ref Range   Lactic Acid, Venous 0.8 0.5 - 1.9 mmol/L    Comment: Performed at [facility], [address] [place], [place] [address]  Troponin I (High Sensitivity)     Status: Abnormal   Collection Time: 01/12/23  2:45 AM  Result Value Ref Range   Troponin I (High Sensitivity) 35 (H) <18 ng/L    Comment: (NOTE) Elevated high sensitivity troponin I (hsTnI) values and significant  changes across serial measurements may suggest ACS but many other  chronic and acute conditions are known to elevate hsTnI results.  Refer to the "Links" section for chest pain algorithms and additional  guidance. Performed at [facility], [address], [place], [place] [address]   TSH     Status: None   Collection Time: 01/12/23  2:45 AM  Result Value Ref Range   TSH 2.888 0.350 - 4.500 uIU/mL    Comment: Performed by a 3rd Generation assay with a functional sensitivity of <=0.01 uIU/mL. Performed at [facility], [address], [place], [place] [address]   Troponin I (High Sensitivity)     Status: Abnormal   Collection Time: 01/12/23  5:36 AM  Result Value Ref Range   Troponin I (High Sensitivity) 24 (H) <18 ng/L    Comment: (NOTE) Elevated high sensitivity troponin I (hsTnI) values and significant  changes across serial measurements may suggest ACS but many other  chronic and acute conditions are known to elevate hsTnI results.  Refer to the "Links" section for chest pain algorithms and  additional  guidance. Performed at [facility], [address], [place], [place] [address]    DG Chest Portable 1 View  Result Date: 01/12/2023 CLINICAL DATA:  Shortness of breath. EXAM: PORTABLE CHEST 1 VIEW COMPARISON:  Chest CT without contrast 11/24/2022 FINDINGS: The lungs are emphysematous with decreased inspiration. There is interval development of a small right pleural effusion with overlying opacities in the right base consistent with atelectasis or pneumonia. No other focal acute process is seen. Heart size and vasculature are normal. The mediastinum is normally outlined. There is aortic atherosclerosis. Fusion plating partially visible lower C-spine. IMPRESSION: 1. Interval development of a small right pleural effusion with overlying opacities in the right base consistent with atelectasis or pneumonia. Clinical correlation and radiographic follow-up recommended. 2. COPD. 3. Aortic atherosclerosis. Electronically Signed   By: Almira Bar M.D.   On: 01/12/2023 00:32   CT ABDOMEN PELVIS W CONTRAST  Result Date: 01/12/2023 CLINICAL DATA:  Abdomen pain EXAM: CT ABDOMEN AND PELVIS WITH CONTRAST TECHNIQUE: Multidetector CT imaging of the abdomen and pelvis was performed using the standard protocol following bolus administration of intravenous contrast. RADIATION DOSE REDUCTION: This exam was performed according to the departmental dose-optimization program which includes automated exposure control, adjustment of the mA and/or kV according to patient size and/or use of iterative reconstruction technique. CONTRAST:  75mL OMNIPAQUE IOHEXOL 350 MG/ML SOLN COMPARISON:  PET CT 12/16/2022, CT 09/19/2020 FINDINGS: Lower chest: Lung bases demonstrate heterogeneous airspace disease in the right lower lobe. Incompletely visualized heterogenous right hilar mass measuring approximately 5.4 by 4.9 cm with encasement of multiple pulmonary vessels. Mild mass effect on the right side of left atrium. Emphysema  with large bulla at the right base containing fluid level. Heterogeneous consolidation and airspace disease in the right lower lobe. Band like scarring in the left base. Hepatobiliary: No focal liver abnormality is seen. No gallstones, gallbladder wall thickening, or biliary dilatation. Pancreas: No ductal dilatation. Soft tissue stranding at the pancreatico duodenal groove. Spleen: Normal in size without focal abnormality. Adrenals/Urinary Tract: Adrenal glands are normal. Cyst upper pole right kidney, no imaging follow-up is recommended. Mild right hydronephrosis. Bladder is unremarkable. Stomach/Bowel: The stomach is nonenlarged. Wall thickening and stranding at the duodenal bulb and second portion of duodenum. Negative appendix. Vascular/Lymphatic: Advanced aortic atherosclerosis. No aneurysm. Retroaortic left renal vein. Reproductive: Uterus and bilateral adnexa are unremarkable. Other: Negative for free air.  No evidence for pelvic effusion. Musculoskeletal: No acute or suspicious osseous abnormality. IMPRESSION: 1. Wall thickening and stranding at the duodenal bulb and second portion of duodenum, with inflammatory changes at the pancreatic head and uncinate process. Findings could be secondary to pancreatitis and duodenitis. Suggest correlation with enzymes. 2. Incompletely visualized large heterogeneous right hilar and infrahilar mass. Heterogeneous consolidations and ground-glass disease in the right lower lobe potentially due to postobstructive pneumonia. Large bulla in the right lower lobe now containing fluid level suggesting superimposed infection. 3. Mild right hydronephrosis. Aortic Atherosclerosis (ICD10-I70.0) and Emphysema (ICD10-J43.9). Electronically Signed   By: Jasmine Pang M.D.   On: 01/12/2023 00:22    Pending Labs Unresulted Labs (From admission, onward)     Start     Ordered   01/13/23 0500  Basic metabolic panel  Tomorrow morning,   R        01/12/23 0758   01/12/23 0900  Basic  metabolic panel  Once,   R        01/12/23 0758   01/12/23 0201  HIV Antibody (routine testing w rflx)  (HIV Antibody (Routine testing w reflex) panel)  Once,   R        01/12/23 0208   01/12/23 0034  Culture, blood (single)  Once,   STAT        01/12/23 0033            Vitals/Pain Today's Vitals   01/12/23 0400 01/12/23 0415 01/12/23 0420 01/12/23 0600  BP: (!) 125/107 116/74 116/74 121/68  Pulse: (!) 138 (!) 52 100 84  Resp:   (!) 22 (!) 22  Temp:   98 F (36.7 C)   TempSrc:   Oral   SpO2: (!) 86% (!) 89% 92% 93%  Weight:      Height:      PainSc:        Isolation Precautions No active isolations  Medications Medications  ondansetron (ZOFRAN) injection 4 mg (4 mg Intravenous Given 01/12/23 0107)  amLODipine (NORVASC) tablet 10 mg (has no administration in time range)  atorvastatin (LIPITOR) tablet 40 mg (40 mg Oral Given 01/12/23 0310)  pantoprazole (PROTONIX) EC tablet 40 mg (has no administration in time range)  enoxaparin (LOVENOX) injection 40 mg (has no administration in time range)  acetaminophen (TYLENOL) tablet 650 mg (has no administration in time range)    Or  acetaminophen (TYLENOL) suppository 650 mg (has no administration in time range)  ipratropium-albuterol (DUONEB) 0.5-2.5 (  3) MG/3ML nebulizer solution 3 mL (3 mLs Nebulization Given 01/12/23 0309)  metoprolol tartrate (LOPRESSOR) tablet 25 mg (has no administration in time range)  cefTRIAXone (ROCEPHIN) 1 g in sodium chloride 0.9 % 100 mL IVPB (has no administration in time range)  azithromycin (ZITHROMAX) tablet 500 mg (has no administration in time range)  ondansetron (ZOFRAN) injection 4 mg (4 mg Intravenous Given 01/11/23 1716)  morphine (PF) 4 MG/ML injection 4 mg (4 mg Intravenous Given 01/11/23 2154)  sodium chloride 0.9 % bolus 1,000 mL (0 mLs Intravenous Stopped 01/11/23 2357)  iohexol (OMNIPAQUE) 350 MG/ML injection 75 mL (75 mLs Intravenous Contrast Given 01/11/23 2355)  cefTRIAXone (ROCEPHIN) 1 g in  sodium chloride 0.9 % 100 mL IVPB (0 g Intravenous Stopped 01/12/23 0223)  azithromycin (ZITHROMAX) tablet 500 mg (500 mg Oral Given 01/12/23 0107)  potassium chloride SA (KLOR-CON M) CR tablet 40 mEq (40 mEq Oral Given 01/12/23 0106)  sodium chloride 0.9 % bolus 500 mL (0 mLs Intravenous Stopped 01/12/23 0223)  HYDROmorphone (DILAUDID) injection 0.5 mg (0.5 mg Intravenous Given 01/12/23 0106)  LORazepam (ATIVAN) tablet 0.5 mg (0.5 mg Oral Given 01/12/23 0310)    Mobility walks     Focused Assessments Pulmonary Assessment Handoff:  Lung sounds: Bilateral Breath Sounds: Expiratory wheezes O2 Device: Other (Comment), Nasal Cannula (salter) O2 Flow Rate (L/min): 9 L/min    R Recommendations: See Admitting Provider Note  Report given to:   Additional Notes:

## 2023-01-12 NOTE — Telephone Encounter (Signed)
Pharmacy Patient Advocate Encounter  Insurance verification completed.    The patient is insured through [employer] Medicare Part D   The patient is currently admitted and ran test claims for the following: Eliquis.  Copays and coinsurance results were relayed to Inpatient clinical team.  

## 2023-01-12 NOTE — ED Notes (Signed)
Provider at bedside requested breathing treatment given after she is done speaking with patient.

## 2023-01-12 NOTE — Assessment & Plan Note (Addendum)
Uncontrolled, contributing to episodes of shortness of breath with large spasm versus vocal cord dysfunction. - Continue Remeron 15 mg nightly - Atarax scheduled 10 mg 3 times daily - Continue home citalopram 10 mg daily - May use Ativan 0.5 mg sparingly for anxiety that is not responsive to hydroxyzine - Consult to psych for recommendations given desire to avoid benzodiazepines as she has a fentanyl patch

## 2023-01-12 NOTE — Consult Note (Signed)
NAME:  [name], MRN:  [id number], DOB:  1956/07/08, LOS: 0 ADMISSION DATE:  01/11/2023, CONSULTATION DATE:  01/12/23 REFERRING MD:  Loel Ro, CHIEF COMPLAINT: Acute respiratory failure  History of Present Illness:  [name] is a 67 year old woman with a history of COPD, chronic respiratory failure with hypoxia recently started on oxygen, likely recurrent non-small cell lung cancer who presented to the hospital with 2 weeks of poor p.o. intake, nausea, vomiting.  She had called the pulmonary office who had referred her to the ED.  In the emergency department she was found to have leukocytosis and new right lower lobe infiltrate.  She was started on empiric antibiotics.  She has had waxing and waning shortness of breath associated with panic attacks and stridor that is reversible with reassurance.  She was noted to have an episode in her recent pulmonology visit and was thought to have vocal cord dysfunction.  Pertinent  Medical History  VCD COPD NSCLC, likely recurrent in c/l hilum GERD Bladder cancer   Significant Hospital Events: Including procedures, antibiotic start and stop dates in addition to other pertinent events   5/2 admitted  Interim History / Subjective:    Objective   Blood pressure (!) 146/65, pulse 99, temperature 97.6 F (36.4 C), temperature source Oral, resp. rate (!) 30, height 5\' 8"  (1.727 m), weight 79.2 kg, SpO2 99 %.    FiO2 (%):  [100 %] 100 %  No intake or output data in the 24 hours ending 01/12/23 0933 Filed Weights   01/11/23 1658  Weight: 79.2 kg    Examination: General: Chronically ill-appearing woman sitting up in bed no acute distress HENT: [place]/AT, eyes anicteric Lungs: Breathing comfortably on nasal cannula, no accessory muscle use.  Stridor of variable intensity, inspiratory and expiratory, started within a few minutes of me coming into the room but not present when I initially presented.  Right lower lobe rales, otherwise no adventitial  lung sounds. Cardiovascular: S1S2, regular rate and rhythm Abdomen: Soft, nontender, nondistended Extremities: No cyanosis or edema Neuro: Awake, alert, answering questions appropriately, normal speech.   Resolved Hospital Problem list     Assessment & Plan:  On chronic respiratory failure with hypoxia Postobstructive pneumonia in the right lower lobe Enlarging right hilar mass, history of left lung adenocarcinoma, concern for metastases - Has outpatient bronchoscopy scheduled with Dr. Tonia Brooms on 01/17/2023 -Agree with ceftriaxone and azithromycin empirically for community-acquired pneumonia - Supplemental oxygen as required to maintain SpO2 greater than 90%.  Did not require BiPAP this morning. - Recommend escalating her anxiolytics.  Referral to a therapist to help with vocal cord dysfunction may be of benefit. -Antireflux medications -Continue triple inhaled therapy, transitioned to nebulizers while admitted -Steroids per primary team  Patient updated at bedside.  PCCM will be available as needed.  Please call with questions.  Best Practice (right click and "Reselect all SmartList Selections" daily)   Per primary  Labs   CBC: Recent Labs  Lab 01/11/23 1719  WBC 16.8*  HGB 13.2  HCT 41.7  MCV 86.2  PLT 426*    Basic Metabolic Panel: Recent Labs  Lab 01/11/23 1719  NA 137  K 3.1*  CL 100  CO2 27  GLUCOSE 97  BUN 16  CREATININE 1.06*  CALCIUM 8.6*   GFR: Estimated Creatinine Clearance: 56.9 mL/min (A) (by C-G formula based on SCr of 1.06 mg/dL (H)). Recent Labs  Lab 01/11/23 1719 01/12/23 0050  WBC 16.8*  --   LATICACIDVEN  --  0.8    Liver Function Tests: Recent Labs  Lab 01/11/23 1719  AST 15  ALT 23  ALKPHOS 84  BILITOT 1.6*  PROT 7.6  ALBUMIN 3.7   Recent Labs  Lab 01/11/23 1719  LIPASE 27   No results for input(s): "AMMONIA" in the last 168 hours.  ABG    Component Value Date/Time   TCO2 28 11/04/2020 1006     Coagulation  Profile: No results for input(s): "INR", "PROTIME" in the last 168 hours.  Cardiac Enzymes: No results for input(s): "CKTOTAL", "CKMB", "CKMBINDEX", "TROPONINI" in the last 168 hours.  HbA1C: Hgb A1c MFr Bld  Date/Time Value Ref Range Status  10/03/2022 03:28 PM 6.0 (H) 4.8 - 5.6 % Final    Comment:             Prediabetes: 5.7 - 6.4          Diabetes: >6.4          Glycemic control for adults with diabetes: <7.0   06/23/2020 12:37 PM 5.9 (H) 4.8 - 5.6 % Final    Comment:             Prediabetes: 5.7 - 6.4          Diabetes: >6.4          Glycemic control for adults with diabetes: <7.0     CBG: No results for input(s): "GLUCAP" in the last 168 hours.  Review of Systems:   Review of Systems  Constitutional:  Negative for chills and fever.  Respiratory:  Positive for cough and shortness of breath.   Gastrointestinal:  Positive for diarrhea, nausea and vomiting.     Past Medical History:  She,  has a past medical history of Abnormal CT lung screening (07/10/2020), Anxiety, Anxiety state (02/28/2012), Arthritis, Bladder cancer (HCC) (09/2020), Cancer related pain (03/11/2022), CAP (community acquired pneumonia) (04/20/2022), Centrilobular emphysema (HCC), Chronic left shoulder pain (04/29/2021), Close exposure to COVID-19 virus (05/03/2019), Constipation (04/09/2019), Costochondritis, acute (08/14/2020), DDD (degenerative disc disease), lumbar, Depression, DOE (dyspnea on exertion), Emphysema lung (HCC), Epidermoid cyst (10/29/2018), Epigastric pain (11/16/2020), Fibroid (2003), GERD (gastroesophageal reflux disease), History of radiation therapy, Leg cramping (11/13/2019), Lumbar spine pain (01/09/2015), Menopausal vaginal dryness (11/13/2019), Multiple closed fractures of ribs of left side (08/20/2022), Nocturnal enuresis (06/23/2020), Primary adenocarcinoma of lower lobe of left lung (HCC) (07/2020), Radiation-induced dermatitis, Seasonal allergies, Stage 3 severe COPD by GOLD  classification (HCC), and Wears glasses.   Surgical History:   Past Surgical History:  Procedure Laterality Date   ANTERIOR CERVICAL DECOMP/DISCECTOMY FUSION  03-08-2003 @ [facility]   C3 --- C6   ARTHROTOMY Right 11/09/2015   Procedure: RIGHT WRIST DISTAL RADIAL ULNAR JOINT ARTHROTOMY AND DEBRIDEMENT,  AND ;  Surgeon: Bradly Bienenstock, MD;  Location: [facility] OR;  Service: Orthopedics;  Laterality: Right;   BRONCHIAL BIOPSY  07/28/2020   Procedure: BRONCHIAL BIOPSIES;  Surgeon: Josephine Igo, DO;  Location: [facility];  Service: Pulmonary;;   BRONCHIAL BIOPSY  08/17/2021   Procedure: BRONCHIAL BIOPSIES;  Surgeon: Josephine Igo, DO;  Location: [facility];  Service: Pulmonary;;   BRONCHIAL BRUSHINGS  07/28/2020   Procedure: BRONCHIAL BRUSHINGS;  Surgeon: Josephine Igo, DO;  Location: [facility];  Service: Pulmonary;;   BRONCHIAL BRUSHINGS  08/17/2021   Procedure: BRONCHIAL BRUSHINGS;  Surgeon: Josephine Igo, DO;  Location: [facility];  Service: Pulmonary;;   BRONCHIAL NEEDLE ASPIRATION BIOPSY  07/28/2020   Procedure: BRONCHIAL NEEDLE ASPIRATION BIOPSIES;  Surgeon: Josephine Igo, DO;  Location: [facility];  Service: Pulmonary;;   BRONCHIAL WASHINGS  07/28/2020   Procedure: BRONCHIAL WASHINGS;  Surgeon: Josephine Igo, DO;  Location: [facility];  Service: Pulmonary;;   BRONCHIAL WASHINGS  08/17/2021   Procedure: BRONCHIAL WASHINGS;  Surgeon: Josephine Igo, DO;  Location: [facility];  Service: Pulmonary;;   COLONOSCOPY     CYSTOSCOPY WITH RETROGRADE PYELOGRAM, URETEROSCOPY AND STENT PLACEMENT N/A 09/18/2020   Procedure: CYSTOSCOPY WITH RETROGRADE PYELOGRAM bilateral,URETEROSCOPY AND  STENT PLACEMENT right ureter;  Surgeon: Sebastian Ache, MD;  Location: [facility];  Service: Urology;  Laterality: N/A;  1 HR   HARDWARE REMOVAL Right 07/24/2013   Procedure: RIGHT WRIST HARDWARE REMOVAL, JOINT RELEASE, RIGHT HAND MANIPULATION UNDER ANESTHESIA;  Surgeon: Sharma Covert, MD;  Location:  [facility];  Service: Orthopedics;  Laterality: Right;   MASS EXCISION Left 10/14/2021   Procedure: EXCISION LEFT BACK CYSTIC MASS;  Surgeon: Sheliah Hatch, De Blanch, MD;  Location: [facility];  Service: General;  Laterality: Left;   OPEN REDUCTION INTERNAL FIXATION (ORIF) DISTAL RADIAL FRACTURE Right 11/08/2012   Procedure: OPEN REDUCTION INTERNAL FIXATION (ORIF) RIGHT DISTAL RADIUS FRACTURE;  Surgeon: Sharma Covert, MD;  Location: [facility];  Service: Orthopedics;  Laterality: Right;   TRANSURETHRAL RESECTION OF BLADDER TUMOR N/A 09/18/2020   Procedure: TRANSURETHRAL RESECTION OF BLADDER TUMOR (TURBT);  Surgeon: Sebastian Ache, MD;  Location: [facility];  Service: Urology;  Laterality: N/A;   TRANSURETHRAL RESECTION OF BLADDER TUMOR N/A 11/04/2020   Procedure: RESTAGING TRANSURETHRAL RESECTION OF BLADDER TUMOR (TURBT); BILATERAL RETROGRADE PYELOGRAM;  Surgeon: Sebastian Ache, MD;  Location: [facility];  Service: Urology;  Laterality: N/A;  1 HR   VIDEO BRONCHOSCOPY WITH ENDOBRONCHIAL NAVIGATION N/A 07/28/2020   Procedure: VIDEO BRONCHOSCOPY WITH ENDOBRONCHIAL NAVIGATION;  Surgeon: Josephine Igo, DO;  Location: [facility];  Service: Pulmonary;  Laterality: N/A;   VIDEO BRONCHOSCOPY WITH RADIAL ENDOBRONCHIAL ULTRASOUND  08/17/2021   Procedure: VIDEO BRONCHOSCOPY WITH RADIAL ENDOBRONCHIAL ULTRASOUND;  Surgeon: Josephine Igo, DO;  Location: [facility];  Service: Pulmonary;;   WRIST ARTHROPLASTY Right 11/09/2015   Procedure: OR DISTAL ULNAR RESECTION ARTHROPLASTY ;  Surgeon: Bradly Bienenstock, MD;  Location: [facility] OR;  Service: Orthopedics;  Laterality: Right;   WRIST ARTHROSCOPY WITH DEBRIDEMENT Right 07/21/2014   Procedure: RIGHT WRIST DISTAL RADIAL ULNAR JOINT DEBRIDEMENT AND JOINT RELEASE POSSIBLE TENDON INTERPOSITION;  Surgeon: Sharma Covert, MD;  Location: [facility] OR;  Service: Orthopedics;  Laterality: Right;     Social History:   reports that  she quit smoking about 2 years ago. Her smoking use included cigarettes. She started smoking about 47 years ago. She has a 50.00 pack-year smoking history. She has been exposed to tobacco smoke. She has never used smokeless tobacco. She reports current alcohol use. She reports that she does not use drugs.   Family History:  Her family history includes Dementia in her father; Diabetes in her daughter; Early death (age of onset: 84) in her sister; Hyperlipidemia in her mother; Hypertension in her daughter and mother; Lung cancer in her father; Parkinson's disease in her father; Throat cancer in her brother.   Allergies Allergies  Allergen Reactions   Codeine Nausea And Vomiting and Other (See Comments)    Hallucinations   Prednisone Other (See Comments)    'Made me feel funny inside my head"-pt cannot be specific     Home Medications  Prior to Admission medications   Medication Sig Start Date End Date Taking?  Authorizing Provider  albuterol (VENTOLIN HFA) 108 (90 Base) MCG/ACT inhaler USE 2 INHALATIONS BY MOUTH  INTO THE LUNGS EVERY 6  HOURS AS NEEDED FOR  SHORTNESS OF BREATH OR  WHEEZING 11/18/22   Fayette Pho, MD  alendronate (FOSAMAX) 70 MG tablet Take 1 tablet (70 mg total) by mouth every 7 (seven) days. Take with a full glass of water on an empty stomach. 12/27/22   Fayette Pho, MD  amLODipine (NORVASC) 10 MG tablet Take 1 tablet (10 mg total) by mouth daily. 10/20/22   Eber Hong, MD  atorvastatin (LIPITOR) 40 MG tablet TAKE 1 TABLET BY MOUTH AT  BEDTIME 07/04/22   Sabino Dick, DO  baclofen (LIORESAL) 10 MG tablet Take 0.5 tablets (5 mg total) by mouth 3 (three) times daily. Patient not taking: Reported on 11/28/2022 06/08/21   Celedonio Savage, MD  Budeson-Glycopyrrol-Formoterol (BREZTRI AEROSPHERE) 160-9-4.8 MCG/ACT AERO Inhale 2 puffs into the lungs in the morning and at bedtime. 11/18/22   Fayette Pho, MD  calcium-vitamin D [facility] WITH D) 500-5 MG-MCG tablet Take 1  tablet by mouth daily with breakfast. 10/03/22   Fayette Pho, MD  Cholecalciferol (VITAMIN D) 50 MCG (2000 UT) tablet Take 1 tablet (2,000 Units total) by mouth daily. 10/03/22   Fayette Pho, MD  citalopram (CELEXA) 10 MG tablet Take 1 tablet (10 mg total) by mouth daily. Follow up in 3-4 weeks for side effect check. 12/06/22   Fayette Pho, MD  diclofenac Sodium (VOLTAREN) 1 % GEL Apply 2 g topically 4 (four) times daily. Apply to painful area of ribs. Need to use regularly for relief. 01/19/21   Fayette Pho, MD  fentaNYL (DURAGESIC) 25 MCG/HR Place 1 patch onto the skin every 3 (three) days. 12/26/22   Fayette Pho, MD  fluticasone [facility]) 50 MCG/ACT nasal spray SHAKE LIQUID AND USE 2 SPRAYS IN [facility] NOSTRIL DAILY 05/02/22   Fayette Pho, MD  gabapentin (NEURONTIN) 600 MG tablet Take 1 tablet (600 mg total) by mouth 3 (three) times daily. 08/19/22   Fayette Pho, MD  hydrOXYzine (ATARAX) 50 MG tablet Take 2 tablets (100 mg total) by mouth at bedtime as needed for anxiety or itching (sleep). 11/30/22   Fayette Pho, MD  ibuprofen (ADVIL) 800 MG tablet Take 1 tablet (800 mg total) by mouth every 8 (eight) hours as needed. 10/14/21   Kinsinger, De Blanch, MD  lidocaine (LIDODERM) 5 % Place 1 patch onto the skin daily. Remove & Discard patch within 12 hours or as directed by MD 08/19/22   Fayette Pho, MD  MELATONIN GUMMIES PO Take 10 mg by mouth at bedtime.    [provider]  naloxone [facility]) nasal spray 4 mg/0.1 mL Use for suspected opioid overdose - too sleepy to wake up, difficulty breathing 05/13/22   Fayette Pho, MD  ondansetron (ZOFRAN) 4 MG tablet Take 1 tablet (4 mg total) by mouth every 8 (eight) hours as needed for nausea or vomiting. Take one tablet before starting each prep. 12/14/22   Jenel Lucks, MD  ondansetron (ZOFRAN-ODT) 4 MG disintegrating tablet Take 1 tablet (4 mg total) by mouth every 8 (eight) hours as needed for nausea or vomiting. 01/02/23    Fayette Pho, MD  pantoprazole (PROTONIX) 40 MG tablet Take 1 tablet (40 mg total) by mouth daily as needed (acid indigestion/heartburn.). 05/27/22   Fayette Pho, MD  polyethylene glycol powder [facility]) 17 GM/SCOOP powder Take 17 g by mouth 2 (two) times daily as needed. 05/27/22   Fayette Pho,  MD  senna (SENOKOT) 8.6 MG TABS tablet Take 1 tablet (8.6 mg total) by mouth at bedtime. 05/27/22   Fayette Pho, MD  triamcinolone cream (KENALOG) 0.1 % Apply 1 Application topically 2 (two) times daily. 12/06/22   Fayette Pho, MD     Critical care time:      Steffanie Dunn, DO 01/12/23 5:59 PM [facility]  For contact information, see Amion. If no response to pager, please call PCCM consult pager. After hours, 7PM- 7AM, please call Elink.

## 2023-01-12 NOTE — Assessment & Plan Note (Deleted)
Mild discomfort diffusely to palpation, overall improving.  - Zofran IV - Regular diet

## 2023-01-12 NOTE — Hospital Course (Addendum)
[name] is a 67 y.o.female with a history of hx of COPD, recurrent adenocarcinoma of LLL, and panic/anxiety that is admitted for AHRF in setting of CAP to [facility] Teaching Service at [facility]. Her hospital course is detailed below:  Acute Hypoxic Respiratory Failure  COPD Exacerbation  CAP  Anxiety  Vocal Cord Dysfunction  Pt presented to ED for N/V, abm pain, and diarrhea for 2 wks. Abm exam benign, transaminases wnl, and lipase wnl. CTAP and CXR did show developing PNA of RLL. Patient also tachypneic and had increased O2 requirement (Baseline 2L [place], needing up to 8L HFNC).  Pulmonology was consulted.  Patient also had panic attacks/anxiety that exacerbated work of breathing and vocal cord dysfunction, leading to stridor.  AHRF thought to be multifactorial including CAP, COPD exacerbation, and vocal cord dysfunction. Patient started on ceftriaxone and azithromycin for CAP coverage.  Patient started on DuoNebs and Solu-Medrol for COPD exacerbation.  Patient was continued on formulary equivalent of home Breztri. Patient was continued on home Celexa and Atarax, and mirtazapine was added to better control anxiety.  Patient completed 3-day course of azithromycin and ceftriaxone was transitioned to cefdinir to finish 5-day total course. At discharge, pt was satting well on 4L [place], afebrile throughout entire admission, ambulating, and tolerating PO.   To further workup potential recurrence of lung cancer, patient is scheduled for outpatient bronchoscopy with pulmonology on 4/7.  COVID test while inpatient was negative 5/3.  New onset Afib w/ RVR (back to NSR at discharge) Patient initially in sinus tachycardia in ED, then noted to be in A-fib with RVR (HR ~120-130).  Cardiology was consulted, and patient started on metoprolol.  Home amlodipine was transition to diltiazem.  Echo unremarkable. CHA2DS2-VASc = 4 (aortic atherosclerosis noted on CT, HTN, age, female). Patient was started on Eliquis twice daily, which  is okay to hold for upcoming outpatient bronchoscopy on 5/7.  At time of discharge, patient tachy to HR ~90-110 and in normal sinus rhythm. Pt instructed to make outpt follow-up with Cardiology to assess Afib burden.   Other chronic conditions were medically managed with home medications and formulary alternatives as necessary   PCP Follow-up Recommendations: 1) Started Mirtazipine for anxiety and decreased appetite. F/u symptoms and titrate as needed. 2) Follow-up on O2 requirement and ability to wean. Pt was discharged on 4L, but was on 2L baseline prior to admission. 3) Ensure outpt Cardiology follow-up for Afib RVR during admission (back to NSR at discharge).

## 2023-01-12 NOTE — ED Notes (Signed)
Pt transported to floor, NAD noted at this time.

## 2023-01-12 NOTE — Significant Event (Signed)
Rapid Response Event Note   Reason for Call :  WOB, Bipap  Initial Focused Assessment:  Arrived to room, patient sitting upright in bed without support, in no distress. Able to speak in full sentences. Lungs clear/diminished. Some upper airway stridor noted when CCM MD arrived, pt endorses anxiety is a factor in her breathing. Skin warm and dry.   146/65 (81) HR 99 RR 30 O2 99 9L [place]  Interventions:  Wean [place] 5L- 96%, RR 22 CCM at bedside  Plan of Care:  Bipap not needed at this time. Recommend continuous pulse ox monitoring. Per primary team, will transfer to PCU for full monitoring.   Event Summary:  MD Notified: A. Maxwell MD Call Time: [phone] Arrival Time: 0918 End Time: [id number]  Truddie Crumble, RN

## 2023-01-13 ENCOUNTER — Telehealth: Payer: Self-pay | Admitting: Primary Care

## 2023-01-13 ENCOUNTER — Other Ambulatory Visit (HOSPITAL_COMMUNITY): Payer: Self-pay

## 2023-01-13 ENCOUNTER — Encounter (HOSPITAL_COMMUNITY): Payer: Self-pay | Admitting: Family Medicine

## 2023-01-13 DIAGNOSIS — I4891 Unspecified atrial fibrillation: Secondary | ICD-10-CM | POA: Insufficient documentation

## 2023-01-13 DIAGNOSIS — J9601 Acute respiratory failure with hypoxia: Secondary | ICD-10-CM | POA: Diagnosis not present

## 2023-01-13 DIAGNOSIS — J383 Other diseases of vocal cords: Secondary | ICD-10-CM | POA: Diagnosis not present

## 2023-01-13 DIAGNOSIS — E43 Unspecified severe protein-calorie malnutrition: Secondary | ICD-10-CM | POA: Insufficient documentation

## 2023-01-13 DIAGNOSIS — J189 Pneumonia, unspecified organism: Secondary | ICD-10-CM | POA: Diagnosis not present

## 2023-01-13 LAB — BASIC METABOLIC PANEL
Anion gap: 12 (ref 5–15)
BUN: 9 mg/dL (ref 8–23)
CO2: 27 mmol/L (ref 22–32)
Calcium: 8.8 mg/dL — ABNORMAL LOW (ref 8.9–10.3)
Chloride: 99 mmol/L (ref 98–111)
Creatinine, Ser: 0.81 mg/dL (ref 0.44–1.00)
GFR, Estimated: >60 mL/min (ref >60)
Glucose, Bld: 142 mg/dL — ABNORMAL HIGH (ref 70–99)
Potassium: 4.1 mmol/L (ref 3.5–5.1)
Sodium: 138 mmol/L (ref 135–145)

## 2023-01-13 LAB — SARS CORONAVIRUS 2 BY RT PCR: SARS Coronavirus 2 by RT PCR: NEGATIVE

## 2023-01-13 LAB — MAGNESIUM: Magnesium: 2.1 mg/dL (ref 1.7–2.4)

## 2023-01-13 LAB — CULTURE, BLOOD (SINGLE): Culture: NO GROWTH

## 2023-01-13 MED ORDER — CEFDINIR 300 MG PO CAPS
300.0000 mg | ORAL_CAPSULE | Freq: Two times a day (BID) | ORAL | 0 refills | Status: DC
  Filled 2023-01-13: qty 5, 3d supply, fill #0

## 2023-01-13 MED ORDER — DILTIAZEM HCL ER COATED BEADS 120 MG PO CP24
120.0000 mg | ORAL_CAPSULE | Freq: Every day | ORAL | Status: DC
  Administered 2023-01-13 – 2023-01-15 (×3): 120 mg via ORAL
  Filled 2023-01-13 (×3): qty 1

## 2023-01-13 MED ORDER — MIRTAZAPINE 15 MG PO TABS
15.0000 mg | ORAL_TABLET | Freq: Every day | ORAL | Status: DC
  Administered 2023-01-13 – 2023-01-14 (×2): 15 mg via ORAL
  Filled 2023-01-13 (×2): qty 1

## 2023-01-13 MED ORDER — FENTANYL 25 MCG/HR TD PT72: 1.0000 | MEDICATED_PATCH | TRANSDERMAL | Status: DC

## 2023-01-13 MED ORDER — MIRTAZAPINE 15 MG PO TABS
15.0000 mg | ORAL_TABLET | Freq: Every day | ORAL | 0 refills | Status: DC
  Filled 2023-01-13: qty 30, 30d supply, fill #0

## 2023-01-13 MED ORDER — HYDROCORTISONE (PERIANAL) 2.5 % EX CREA
TOPICAL_CREAM | Freq: Two times a day (BID) | CUTANEOUS | Status: DC
  Administered 2023-01-20 – 2023-02-27 (×19): 1 via RECTAL
  Filled 2023-01-13 (×6): qty 28.35

## 2023-01-13 MED ORDER — BOOST PLUS PO LIQD
237.0000 mL | Freq: Two times a day (BID) | ORAL | Status: DC
  Administered 2023-01-13 – 2023-01-14 (×3): 237 mL via ORAL
  Filled 2023-01-13 (×7): qty 237

## 2023-01-13 MED ORDER — CEFDINIR 300 MG PO CAPS
300.0000 mg | ORAL_CAPSULE | Freq: Two times a day (BID) | ORAL | 0 refills | Status: DC
  Filled 2023-01-13: qty 2, 1d supply, fill #0

## 2023-01-13 MED ORDER — FENTANYL 25 MCG/HR TD PT72
1.0000 | MEDICATED_PATCH | TRANSDERMAL | Status: DC
  Administered 2023-01-13: 1 via TRANSDERMAL
  Filled 2023-01-13: qty 1

## 2023-01-13 MED ORDER — ADULT MULTIVITAMIN W/MINERALS CH
1.0000 | ORAL_TABLET | Freq: Every day | ORAL | Status: DC
  Administered 2023-01-13 – 2023-01-15 (×3): 1 via ORAL
  Filled 2023-01-13 (×3): qty 1

## 2023-01-13 MED ORDER — HYDROXYZINE HCL 10 MG PO TABS
10.0000 mg | ORAL_TABLET | Freq: Two times a day (BID) | ORAL | Status: DC
  Administered 2023-01-13 – 2023-01-14 (×3): 10 mg via ORAL
  Filled 2023-01-13 (×3): qty 1

## 2023-01-13 MED ORDER — METOPROLOL TARTRATE 25 MG PO TABS
25.0000 mg | ORAL_TABLET | Freq: Two times a day (BID) | ORAL | 0 refills | Status: DC
  Filled 2023-01-13: qty 60, 30d supply, fill #0

## 2023-01-13 MED ORDER — APIXABAN 5 MG PO TABS
5.0000 mg | ORAL_TABLET | Freq: Two times a day (BID) | ORAL | 0 refills | Status: DC
  Filled 2023-01-13: qty 60, 30d supply, fill #0

## 2023-01-13 MED ORDER — CEFDINIR 300 MG PO CAPS
300.0000 mg | ORAL_CAPSULE | Freq: Two times a day (BID) | ORAL | Status: DC
  Administered 2023-01-13 – 2023-01-15 (×5): 300 mg via ORAL
  Filled 2023-01-13 (×6): qty 1

## 2023-01-13 MED ORDER — SALINE SPRAY 0.65 % NA SOLN
1.0000 | NASAL | Status: DC | PRN
  Filled 2023-01-13: qty 44

## 2023-01-13 MED ORDER — DILTIAZEM HCL ER COATED BEADS 120 MG PO CP24
120.0000 mg | ORAL_CAPSULE | Freq: Every day | ORAL | 0 refills | Status: DC
  Filled 2023-01-13: qty 30, 30d supply, fill #0

## 2023-01-13 NOTE — Progress Notes (Signed)
NAME:  ARYAM BARROS, MRN:  [id number], DOB:  1956/02/18, LOS: 1 ADMISSION DATE:  01/11/2023, CONSULTATION DATE:  01/12/23 REFERRING MD:  Loel Ro, CHIEF COMPLAINT: Acute respiratory failure  History of Present Illness:  Mrs. Hale is a 67 year old woman with a history of COPD, chronic respiratory failure with hypoxia recently started on oxygen, likely recurrent non-small cell lung cancer who presented to the hospital with 2 weeks of poor p.o. intake, nausea, vomiting.  She had called the pulmonary office who had referred her to the ED.  In the emergency department she was found to have leukocytosis and new right lower lobe infiltrate.  She was started on empiric antibiotics.  She has had waxing and waning shortness of breath associated with panic attacks and stridor that is reversible with reassurance.  She was noted to have an episode in her recent pulmonology visit and was thought to have vocal cord dysfunction.  Pertinent  Medical History  VCD COPD NSCLC, likely recurrent in c/l hilum GERD Bladder cancer   Significant Hospital Events: Including procedures, antibiotic start and stop dates in addition to other pertinent events   5/2 admitted  Interim History / Subjective:  No respiratory distress whatsoever.  No wheezing whatsoever.  Objective   Blood pressure 136/89, pulse 100, temperature 97.6 F (36.4 C), temperature source Oral, resp. rate 20, height 5\' 8"  (1.727 m), weight 79.2 kg, SpO2 95 %.        Intake/Output Summary (Last 24 hours) at 01/13/2023 4098 Last data filed at 01/12/2023 2300 Gross per 24 hour  Intake 374.13 ml  Output --  Net 374.13 ml   Filed Weights   01/11/23 1658  Weight: 79.2 kg    Examination: Elderly female who is sleeping without respiratory distress No JVD or lymphadenopathy is appreciated Chest is clear to auscultation Heart sounds are regular Abdomen obese soft nontender Extremities without edema   Resolved Hospital Problem list      Assessment & Plan:  On chronic respiratory failure with hypoxia Postobstructive pneumonia in the right lower lobe Enlarging right hilar mass, history of left lung adenocarcinoma, concern for metastases Vocal cord dysfunction Severe anxiety Outpatient bronchoscopy with Dr.: 01/17/2023 Empirical antimicrobial therapy Supplemental oxygen as needed Increased anxiolytics Triple inhaled therapy Steroids although she is not wheezing   Patient updated at bedside.  PCCM will be available as needed.  Please call with questions.  Best Practice (right click and "Reselect all SmartList Selections" daily)   Per primary  Labs   CBC: Recent Labs  Lab 01/11/23 1719  WBC 16.8*  HGB 13.2  HCT 41.7  MCV 86.2  PLT 426*     Basic Metabolic Panel: Recent Labs  Lab 01/11/23 1719 01/12/23 1027 01/13/23 0201  NA 137 138 138  K 3.1* 3.8 4.1  CL 100 103 99  CO2 27 26 27   GLUCOSE 97 89 142*  BUN 16 13 9   CREATININE 1.06* 0.90 0.81  CALCIUM 8.6* 8.2* 8.8*  MG  --   --  2.1    GFR: Estimated Creatinine Clearance: 74.5 mL/min (by C-G formula based on SCr of 0.81 mg/dL). Recent Labs  Lab 01/11/23 1719 01/12/23 0050  WBC 16.8*  --   LATICACIDVEN  --  0.8     Liver Function Tests: Recent Labs  Lab 01/11/23 1719  AST 15  ALT 23  ALKPHOS 84  BILITOT 1.6*  PROT 7.6  ALBUMIN 3.7    Recent Labs  Lab 01/11/23 1719  LIPASE 27  No results for input(s): "AMMONIA" in the last 168 hours.  ABG    Component Value Date/Time   HCO3 30.6 (H) 01/12/2023 1027   TCO2 28 11/04/2020 1006   O2SAT 20.1 01/12/2023 1027     Coagulation Profile: No results for input(s): "INR", "PROTIME" in the last 168 hours.  Cardiac Enzymes: No results for input(s): "CKTOTAL", "CKMB", "CKMBINDEX", "TROPONINI" in the last 168 hours.  HbA1C: Hgb A1c MFr Bld  Date/Time Value Ref Range Status  10/03/2022 03:28 PM 6.0 (H) 4.8 - 5.6 % Final    Comment:             Prediabetes: 5.7 - 6.4           Diabetes: >6.4          Glycemic control for adults with diabetes: <7.0   06/23/2020 12:37 PM 5.9 (H) 4.8 - 5.6 % Final    Comment:             Prediabetes: 5.7 - 6.4          Diabetes: >6.4          Glycemic control for adults with diabetes: <7.0     CBG: No results for input(s): "GLUCAP" in the last 168 hours.       Brett Canales Arrietty Dercole ACNP Acute Care Nurse Practitioner [facility] Please consult Amion 01/13/2023, 9:23 AM

## 2023-01-13 NOTE — Progress Notes (Addendum)
Pt is more pleasant, alert and fully oriented x 4. She repeatedly complains about having hot flashes and having nose dryness and burning tonight. Pt is currently on 15 LPM of HFNCL with SPO2 97%. (Room air SPO2 is 75%). Her RR 18-24, NSR on the monitor, HR 97-102, BP stable. She remains afebrile. We consider to titrate HFNCL down to 8 LPM. [place] nasal spray is provided PRN.  Pt is able to tolerate and maintain SPO2 > above 90-93%. We will keep her SPO2 above 90% per MD noted.   Pt reports feeling a lot better, denies SOB or pleuritic chest pain. She is able to tolerate short ambulation independently to the bedside commode with no breathing difficulties.She occasionally having non-productive coughing.  Pt is significantly progressing after Solu-medrol 40mg  IV given BID, breathing treatment by RT with DUONEB given q 4 hrs, Revefenacin (YUPELRI)  NB 175 mcg QD, Budesonide ( PULMICORT) NB 0.25 mg BID, Arformoterol (BROVANA) NB 15 mcg BID. We will continue to monitor.    Filiberto Pinks, RN

## 2023-01-13 NOTE — Progress Notes (Signed)
Pt has PRN biPAP order, pt currently nauseated due to breathing tx. BiPAP not needed currently. RT will cont to monitor as needed.

## 2023-01-13 NOTE — TOC Transition Note (Signed)
Discharge medications (5) are being stored in the main pharmacy on the ground floor until patient is ready for discharge.   

## 2023-01-13 NOTE — Discharge Instructions (Signed)
You were admitted to hospital for Pneumonia, an infection of the lungs. We treated you with antibiotics and steroids and you got better. You also had a few episodes of Atrial Fibrillation, which is an abnormal heart rhythm that puts you at increased risk of blood clots. You were started on Eliquis to prevent blood clots and given metoprolol and Diltiazem to help control your heart rhythm. We added Mirtazipine, which can help with anxiety. - Continue taking Cefdinir 1 capsule every 12 hours for the next 2 days (last dose will be 5/6 at night) - Continue on 4 liter of oxygen on nasal cannula. You can try to wean down to 2 liters if you are comfortable, but turn it back up if you have trouble breathing - Continue taking Eliquis 1 tablet twice a day - Continue taking Metoprolol 1 tablet twice a day - Continue taking Diltiazem 1 tablet once a day - Continue taking Mirtazipine 1 tablet every night - Call [facility] to schedule a follow-up appointment with the cardiologists (heart doctors) See the phone number and address below: Address: [address], [place], [place] [address] Phone: [phone]   Information on my medicine - ELIQUIS (apixaban)  This medication education was reviewed with me or my healthcare representative as part of my discharge preparation.    Why was Eliquis prescribed for you? Eliquis was prescribed for you to reduce the risk of a blood clot forming that can cause a stroke if you have a medical condition called atrial fibrillation (a type of irregular heartbeat).  What do You need to know about Eliquis ? Take your Eliquis TWICE DAILY - one tablet in the morning and one tablet in the evening with or without food. If you have difficulty swallowing the tablet whole please discuss with your pharmacist how to take the medication safely.  Take Eliquis exactly as prescribed by your doctor and DO NOT stop taking Eliquis without talking to the doctor who prescribed  the medication.  Stopping may increase your risk of developing a stroke.  Refill your prescription before you run out.  After discharge, you should have regular check-up appointments with your healthcare provider that is prescribing your Eliquis.  In the future your dose may need to be changed if your kidney function or weight changes by a significant amount or as you get older.  What do you do if you miss a dose? If you miss a dose, take it as soon as you remember on the same day and resume taking twice daily.  Do not take more than one dose of ELIQUIS at the same time to make up a missed dose.  Important Safety Information A possible side effect of Eliquis is bleeding. You should call your healthcare provider right away if you experience any of the following: Bleeding from an injury or your nose that does not stop. Unusual colored urine (red or dark brown) or unusual colored stools (red or black). Unusual bruising for unknown reasons. A serious fall or if you hit your head (even if there is no bleeding).  Some medicines may interact with Eliquis and might increase your risk of bleeding or clotting while on Eliquis. To help avoid this, consult your healthcare provider or pharmacist prior to using any new prescription or non-prescription medications, including herbals, vitamins, non-steroidal anti-inflammatory drugs (NSAIDs) and supplements.  This website has more information on Eliquis (apixaban): http://www.eliquis.com/eliquis/home

## 2023-01-13 NOTE — Progress Notes (Signed)
PCP visit with patient. Spent time at bedside this evening. Ms. Shelton still experiencing anxiety-related vocal cord dysfunction and SOB. SpO2 reassuringly normal throughout most of the episode. Able to breathe through it.   Will return for a visit tomorrow evening prior to my overnight IPTS shift.   Fayette Pho, MD

## 2023-01-13 NOTE — Progress Notes (Signed)
Initial Nutrition Assessment  DOCUMENTATION CODES:   Severe malnutrition in context of acute illness/injury  INTERVENTION:  Boost Plus po TID, each supplement provides 360 kcal and 14 grams of protein MVI  Education on adequate nutrition  Encouraged po intake    NUTRITION DIAGNOSIS:   Severe Malnutrition related to acute illness as evidenced by moderate fat depletion, moderate muscle depletion.   GOAL:   Patient will meet greater than or equal to 90% of their needs   MONITOR:   PO intake, Labs, I & O's, Supplement acceptance, Weight trends  REASON FOR ASSESSMENT:   Malnutrition Screening Tool    ASSESSMENT:   67 y.o. female with PMHx including adenocarcinoma of LLL now with recurrence, COPD, and anxiety presents with acute on chronic hypoxemic respiratory failure likely multifactorial  Labs: Glu 142   Meds: zithromax, remeron, omnicef, rocephin   I/O's: no UOP  Visited patient at bedside who reports no appetite for at least 3 weeks now. She has had intermittent nausea and persistent diarrhea. She reports her last episode of diarrhea was last night which was reported to be "messy". Patient reports UBW of 198# and was surprised to hear she weighs 175# now.   She reports living by herself and that she cooks for herself. Patient reports she gets winded while eating sometimes but is unable to say if it has happened recently since she has barely been eating.   Patient reports nausea under control at this time. No reported issues with chewing and swallowing.   Patient on board with nutrition plans    NUTRITION - FOCUSED PHYSICAL EXAM:  Flowsheet Row Most Recent Value  Orbital Region Moderate depletion  Upper Arm Region Moderate depletion  Thoracic and Lumbar Region No depletion  Buccal Region Moderate depletion  Temple Region Moderate depletion  Clavicle Bone Region No depletion  Clavicle and Acromion Bone Region No depletion  Scapular Bone Region Unable to assess   Dorsal Hand Severe depletion  Patellar Region Moderate depletion  Anterior Thigh Region Moderate depletion  Posterior Calf Region Moderate depletion  Edema (RD Assessment) None  Hair Reviewed  Eyes Reviewed  Mouth Reviewed  Skin Reviewed  Nails Reviewed       Diet Order:   Diet Order             Diet regular Room service appropriate? Yes; Fluid consistency: Thin  Diet effective now                   EDUCATION NEEDS:   Education needs have been addressed  Skin:  Skin Assessment: Reviewed RN Assessment  Last BM:  5/2  Height:   Ht Readings from Last 1 Encounters:  01/11/23 5\' 8"  (1.727 m)    Weight:   Wt Readings from Last 1 Encounters:  01/11/23 79.2 kg    Ideal Body Weight:     BMI:  Body mass index is 26.55 kg/m.  Estimated Nutritional Needs:   Kcal:  1900-2300 kcal  Protein:  95-120 g  Fluid:  >1.9 L    Leodis Rains, RDN, LDN  Clinical Nutrition

## 2023-01-13 NOTE — Progress Notes (Signed)
     Daily Progress Note Intern Pager: [phone]  Patient name: [name] Medical record number: [id number] Date of birth: [birth year] Age: 67 y.o. Gender: female  Primary Care Provider: Fayette Pho, MD Consultants: Cardiology, Pulmonology Code Status: DNR  Pt Overview and Major Events to Date:  5/2: Admitted  Assessment and Plan: CM is a 67yo F w/ hx of COPD, recurrent adenocarcinoma of LLL, and panic/anxiety that is admitted for AHRF in setting of CAP. Per nursing ON, pt started desatting in setting of panic attack. On exam this AM, pt lungs had good air movement throughout and pt was satting well on 7L HFNC. Given exam findings and lack of fever, pt's ON increased O2 is less likely due to progression of PNA or pleural eff. To better treat anxiety, we will add nightly mirtazipine and schedule atarax BID (does not appear that pt was getting her prn atarax).  For her PAF, her HR improving since admission. Cardiology consulted, recommend contiuing Eliquis.   * Acute hypoxic respiratory failure (HCC) - Cont Azithro x3day course (4/2- ) - Cont CTX x5 day course (4/2- ) - Cont Solumedrol x5 day course (4/2- ) - Cont sch duonebs q4h - Cont formulary equiv of home Breztri - Pulmonology following, appreciate recs - Treat anxiety as below  Atrial fibrillation (HCC) Had AF RVR yesterday, but has since returned to sinus rhythm. Cardiology consulted. - Cont Eliquis BID - Cont Metop 25 BID  Anxiety - Add Remeron 15mg  nightly  - Sch Atarax BID - Cont home Celexa   Abdominal pain Mild discomfort diffusely to palpation, overall improving.  - Zofran IV - Regular diet   FEN/GI: Regular PPx: Eliquis Dispo:Pending PT recommendations    Subjective:  Pt reports feeling better this AM and thinks her breathing is improving. Had BM yesterday.   Objective: Temp:  [97.6 F (36.4 C)-98.3 F (36.8 C)] 98 F (36.7 C) (05/03 1136) Pulse Rate:  [88-108] 91 (05/03 1136) Resp:  [17-31]  20 (05/03 1136) BP: (122-153)/(67-89) 126/75 (05/03 1136) SpO2:  [75 %-99 %] 90 % (05/03 1136) Physical Exam: General: Alert, pleasant and calm woman laying in bed. NAD. Cardiovascular: RRR, no murmurs Respiratory: Normal WOB on 6L HFNC. CTAB, good air movement throughout.  Abdomen: Soft, nondistended. Mild discomofrt to palpation diffusely. Normal BS.  Skin: Warm and well perfused.  Laboratory: Most recent CBC Lab Results  Component Value Date   WBC 16.8 (H) 01/11/2023   HGB 13.2 01/11/2023   HCT 41.7 01/11/2023   MCV 86.2 01/11/2023   PLT 426 (H) 01/11/2023   Most recent BMP    Latest Ref Rng & Units 01/13/2023    2:01 AM  BMP  Glucose 70 - 99 mg/dL 962   BUN 8 - 23 mg/dL 9   Creatinine 9.52 - 8.41 mg/dL 3.24   Sodium 401 - 027 mmol/L 138   Potassium 3.5 - 5.1 mmol/L 4.1   Chloride 98 - 111 mmol/L 99   CO2 22 - 32 mmol/L 27   Calcium 8.9 - 10.3 mg/dL 8.8    Lincoln Brigham, MD 01/13/2023, 12:26 PM  PGY-1, [facility] FPTS Intern pager: [phone], text pages welcome Secure chat group [facility] Teaching Service

## 2023-01-13 NOTE — Progress Notes (Signed)
Progress Note  Patient Name: [name] Date of Encounter: 01/13/2023  Primary Cardiologist:   None   Subjective   No chest pain.  No SOB.   She thinks that she feels good today.    Inpatient Medications    Scheduled Meds:  amLODipine  10 mg Oral Daily   apixaban  5 mg Oral BID   arformoterol  15 mcg Nebulization BID   atorvastatin  40 mg Oral QHS   azithromycin  500 mg Oral Daily   budesonide (PULMICORT) nebulizer solution  0.25 mg Nebulization BID   citalopram  10 mg Oral Daily   ipratropium-albuterol  3 mL Nebulization Q4H   methylPREDNISolone (SOLU-MEDROL) injection  40 mg Intravenous BID   metoprolol tartrate  25 mg Oral BID   revefenacin  175 mcg Nebulization Daily   Continuous Infusions:  sodium chloride 10 mL/hr at 01/12/23 2033   cefTRIAXone (ROCEPHIN)  IV 1 g (01/12/23 2037)   PRN Meds: sodium chloride, acetaminophen **OR** acetaminophen, albuterol, hydrOXYzine, ondansetron (ZOFRAN) IV, pantoprazole, sodium chloride   Vital Signs    Vitals:   01/13/23 0232 01/13/23 0458 01/13/23 0528 01/13/23 0747  BP:  136/89    Pulse:  (!) 108  100  Resp:  20    Temp:  98.3 F (36.8 C)  97.6 F (36.4 C)  TempSrc:  Oral  Oral  SpO2: 92% 99% 94%   Weight:      Height:        Intake/Output Summary (Last 24 hours) at 01/13/2023 0805 Last data filed at 01/12/2023 2300 Gross per 24 hour  Intake 374.13 ml  Output --  Net 374.13 ml   Filed Weights   01/11/23 1658  Weight: 79.2 kg    Telemetry    NSR, ST - Personally Reviewed  ECG    NA - Personally Reviewed  Physical Exam   GEN: No acute distress.   Neck: No  JVD Cardiac: RRR, no murmurs, rubs, or gallops.  Respiratory:   Decreased breath sounds, coarse breath sounds right greater than left GI: Soft, nontender, non-distended  MS: No  edema; No deformity. Neuro:  Nonfocal  Psych: Normal affect   Labs    Chemistry Recent Labs  Lab 01/11/23 1719 01/12/23 1027 01/13/23 0201  NA 137 138 138   K 3.1* 3.8 4.1  CL 100 103 99  CO2 27 26 27   GLUCOSE 97 89 142*  BUN 16 13 9   CREATININE 1.06* 0.90 0.81  CALCIUM 8.6* 8.2* 8.8*  PROT 7.6  --   --   ALBUMIN 3.7  --   --   AST 15  --   --   ALT 23  --   --   ALKPHOS 84  --   --   BILITOT 1.6*  --   --   GFRNONAA 58* >60 >60  ANIONGAP 10 9 12      Hematology Recent Labs  Lab 01/11/23 1719  WBC 16.8*  RBC 4.84  HGB 13.2  HCT 41.7  MCV 86.2  MCH 27.3  MCHC 31.7  RDW 14.6  PLT 426*    Cardiac EnzymesNo results for input(s): "TROPONINI" in the last 168 hours. No results for input(s): "TROPIPOC" in the last 168 hours.   BNPNo results for input(s): "BNP", "PROBNP" in the last 168 hours.   DDimer No results for input(s): "DDIMER" in the last 168 hours.   Radiology    ECHOCARDIOGRAM COMPLETE BUBBLE STUDY  Result Date: 01/12/2023  ECHOCARDIOGRAM REPORT   Patient Name:   [name] [facility] Date of Exam: 01/12/2023 Medical Rec #:  [id number]      Height:       68.0 in Accession #:    [id number]     Weight:       174.6 lb Date of Birth:  December 22, 1955      BSA:          1.929 m Patient Age:    67 years       BP:           150/82 mmHg Patient Gender: F              HR:           101 bpm. Exam Location:  Inpatient Procedure: 2D Echo, Cardiac Doppler, Color Doppler, Saline Contrast Bubble Study            and Intracardiac Opacification Agent Indications:    SOB  History:        Patient has no prior history of Echocardiogram examinations.                 Signs/Symptoms:Shortness of Breath; Risk Factors:Former Smoker.  Sonographer:    Raeford Razor Sonographer#2:  Irving Burton Senior Referring Phys: [id number] CARINA M BROWN  Sonographer Comments: Patient had to be scanned sitting up due to dyspnea, could not tolerate any probe pressure or repositioning. IMPRESSIONS  1. Left ventricular ejection fraction, by estimation, is 50 to 55%. The left ventricle has low normal function. The left ventricle has no regional wall motion abnormalities. Indeterminate diastolic  filling due to E-A fusion. There is moderate hypokinesis of the left ventricular, apical apical segment.  2. Right ventricular systolic function is normal. The right ventricular size is normal. There is moderately elevated pulmonary artery systolic pressure. The estimated right ventricular systolic pressure is 46.2 mmHg.  3. A small pericardial effusion is present. The pericardial effusion is circumferential. There is no evidence of cardiac tamponade.  4. The mitral valve is grossly normal. No evidence of mitral valve regurgitation.  5. The aortic valve is tricuspid. Aortic valve regurgitation is not visualized.  6. The inferior vena cava is dilated in size with >50% respiratory variability, suggesting right atrial pressure of 8 mmHg.  7. Agitated saline contrast bubble study was negative, with no evidence of any interatrial shunt. Comparison(s): No prior Echocardiogram. FINDINGS  Left Ventricle: Left ventricular ejection fraction, by estimation, is 50 to 55%. The left ventricle has low normal function. The left ventricle has no regional wall motion abnormalities. Moderate hypokinesis of the left ventricular, apical apical segment. Definity contrast agent was given IV to delineate the left ventricular endocardial borders. The left ventricular internal cavity size was normal in size. There is no left ventricular hypertrophy. Indeterminate diastolic filling due to E-A fusion. Right Ventricle: The right ventricular size is normal. No increase in right ventricular wall thickness. Right ventricular systolic function is normal. There is moderately elevated pulmonary artery systolic pressure. The tricuspid regurgitant velocity is 3.09 m/s, and with an assumed right atrial pressure of 8 mmHg, the estimated right ventricular systolic pressure is 46.2 mmHg. Left Atrium: Left atrial size was normal in size. Right Atrium: Right atrial size was normal in size. Pericardium: A small pericardial effusion is present. The pericardial  effusion is circumferential. There is no evidence of cardiac tamponade. Mitral Valve: The mitral valve is grossly normal. No evidence of mitral valve regurgitation. Tricuspid Valve: The tricuspid valve is grossly normal. Tricuspid  valve regurgitation is trivial. Aortic Valve: The aortic valve is tricuspid. Aortic valve regurgitation is not visualized. Aortic regurgitation PHT measures 379 msec. Pulmonic Valve: The pulmonic valve was normal in structure. Pulmonic valve regurgitation is not visualized. Aorta: The aortic root and ascending aorta are structurally normal, with no evidence of dilitation. Venous: The inferior vena cava is dilated in size with greater than 50% respiratory variability, suggesting right atrial pressure of 8 mmHg. IAS/Shunts: The interatrial septum is aneurysmal. No atrial level shunt detected by color flow Doppler. Agitated saline contrast was given intravenously to evaluate for intracardiac shunting. Agitated saline contrast bubble study was negative, with no evidence of any interatrial shunt.  LEFT VENTRICLE PLAX 2D LVIDd:         4.25 cm LVIDs:         3.25 cm LV PW:         0.90 cm LV IVS:        0.90 cm LVOT diam:     1.80 cm LV SV:         41 LV SV Index:   21 LVOT Area:     2.54 cm  RIGHT VENTRICLE RV S prime:     16.30 cm/s TAPSE (M-mode): 2.4 cm LEFT ATRIUM             Index        RIGHT ATRIUM           Index LA diam:        2.60 cm 1.35 cm/m   RA Area:     14.40 cm LA Vol (A2C):   43.0 ml 22.29 ml/m  RA Volume:   36.10 ml  18.71 ml/m LA Vol (A4C):   65.3 ml 33.85 ml/m LA Biplane Vol: 55.0 ml 28.51 ml/m  AORTIC VALVE LVOT Vmax:   75.40 cm/s LVOT Vmean:  59.000 cm/s LVOT VTI:    0.161 m AI PHT:      379 msec  AORTA Ao Root diam: 3.70 cm Ao Asc diam:  3.20 cm TRICUSPID VALVE TR Peak grad:   38.2 mmHg TR Vmax:        309.00 cm/s  SHUNTS Systemic VTI:  0.16 m Systemic Diam: 1.80 cm Zoila Shutter MD Electronically signed by Zoila Shutter MD Signature Date/Time: 01/12/2023/4:02:48 PM     Final    DG Chest Portable 1 View  Result Date: 01/12/2023 CLINICAL DATA:  Shortness of breath. EXAM: PORTABLE CHEST 1 VIEW COMPARISON:  Chest CT without contrast 11/24/2022 FINDINGS: The lungs are emphysematous with decreased inspiration. There is interval development of a small right pleural effusion with overlying opacities in the right base consistent with atelectasis or pneumonia. No other focal acute process is seen. Heart size and vasculature are normal. The mediastinum is normally outlined. There is aortic atherosclerosis. Fusion plating partially visible lower C-spine. IMPRESSION: 1. Interval development of a small right pleural effusion with overlying opacities in the right base consistent with atelectasis or pneumonia. Clinical correlation and radiographic follow-up recommended. 2. COPD. 3. Aortic atherosclerosis. Electronically Signed   By: Almira Bar M.D.   On: 01/12/2023 00:32   CT ABDOMEN PELVIS W CONTRAST  Result Date: 01/12/2023 CLINICAL DATA:  Abdomen pain EXAM: CT ABDOMEN AND PELVIS WITH CONTRAST TECHNIQUE: Multidetector CT imaging of the abdomen and pelvis was performed using the standard protocol following bolus administration of intravenous contrast. RADIATION DOSE REDUCTION: This exam was performed according to the departmental dose-optimization program which includes automated exposure control, adjustment of the mA  and/or kV according to patient size and/or use of iterative reconstruction technique. CONTRAST:  75mL OMNIPAQUE IOHEXOL 350 MG/ML SOLN COMPARISON:  PET CT 12/16/2022, CT 09/19/2020 FINDINGS: Lower chest: Lung bases demonstrate heterogeneous airspace disease in the right lower lobe. Incompletely visualized heterogenous right hilar mass measuring approximately 5.4 by 4.9 cm with encasement of multiple pulmonary vessels. Mild mass effect on the right side of left atrium. Emphysema with large bulla at the right base containing fluid level. Heterogeneous consolidation and  airspace disease in the right lower lobe. Band like scarring in the left base. Hepatobiliary: No focal liver abnormality is seen. No gallstones, gallbladder wall thickening, or biliary dilatation. Pancreas: No ductal dilatation. Soft tissue stranding at the pancreatico duodenal groove. Spleen: Normal in size without focal abnormality. Adrenals/Urinary Tract: Adrenal glands are normal. Cyst upper pole right kidney, no imaging follow-up is recommended. Mild right hydronephrosis. Bladder is unremarkable. Stomach/Bowel: The stomach is nonenlarged. Wall thickening and stranding at the duodenal bulb and second portion of duodenum. Negative appendix. Vascular/Lymphatic: Advanced aortic atherosclerosis. No aneurysm. Retroaortic left renal vein. Reproductive: Uterus and bilateral adnexa are unremarkable. Other: Negative for free air.  No evidence for pelvic effusion. Musculoskeletal: No acute or suspicious osseous abnormality. IMPRESSION: 1. Wall thickening and stranding at the duodenal bulb and second portion of duodenum, with inflammatory changes at the pancreatic head and uncinate process. Findings could be secondary to pancreatitis and duodenitis. Suggest correlation with enzymes. 2. Incompletely visualized large heterogeneous right hilar and infrahilar mass. Heterogeneous consolidations and ground-glass disease in the right lower lobe potentially due to postobstructive pneumonia. Large bulla in the right lower lobe now containing fluid level suggesting superimposed infection. 3. Mild right hydronephrosis. Aortic Atherosclerosis (ICD10-I70.0) and Emphysema (ICD10-J43.9). Electronically Signed   By: Jasmine Pang M.D.   On: 01/12/2023 00:22    Cardiac Studies   Echo   1. Left ventricular ejection fraction, by estimation, is 50 to 55%. The  left ventricle has low normal function. The left ventricle has no regional  wall motion abnormalities. Indeterminate diastolic filling due to E-A  fusion. There is moderate   hypokinesis of the left ventricular, apical apical segment.   2. Right ventricular systolic function is normal. The right ventricular  size is normal. There is moderately elevated pulmonary artery systolic  pressure. The estimated right ventricular systolic pressure is 46.2 mmHg.   3. A small pericardial effusion is present. The pericardial effusion is  circumferential. There is no evidence of cardiac tamponade.   4. The mitral valve is grossly normal. No evidence of mitral valve  regurgitation.   5. The aortic valve is tricuspid. Aortic valve regurgitation is not  visualized.   6. The inferior vena cava is dilated in size with >50% respiratory  variability, suggesting right atrial pressure of 8 mmHg.   7. Agitated saline contrast bubble study was negative, with no evidence  of any interatrial shunt.   Patient Profile     67 y.o. female with a history of stage 3 COPD with centrilobular emphysema followed by Pulmonology, costochondritis, hypertension, anxiety/ depression, bladder cancer s/p TURBT in 09/2020, and lung cancer s/p radiation, and tobacco abuse who is being seen 01/12/2023 for the evaluation of atrial fibrillation with RVR at the request of Dr. Manson Passey.   Assessment & Plan    PAF:  No PAF.  Continue Eliquis.  OK to hold for upcoming bronchoscopy and upcoming colonoscopy.  See med change below  HTN:  I will change her Norvasc to Cardizem.  BP  is mildly elevated.  Sinus tachy.    ELEVATED PULMONARY PRESSURE:  Likely related to underlying lung disease.     For questions or updates, please contact [facility] Please consult [email or web address] for contact info under Cardiology/STEMI.   Signed, Rollene Rotunda, MD  01/13/2023, 8:05 AM

## 2023-01-13 NOTE — Telephone Encounter (Signed)
Received after office hours call from patients daughter stating that patient was currently admitted in the hospital and they were supposed to have a covid swab today for bronchoscopy procedure scheduled on 5/7. Called number provided and went straight to voice mail, advised if they were currently in the hospital to ask staff if she could be screened for covid.

## 2023-01-13 NOTE — Telephone Encounter (Signed)
Spoke with patient's daughter. She stated patient will most likely want to cancel her procedure but would have her call me back because she is in the hospital.

## 2023-01-13 NOTE — Assessment & Plan Note (Signed)
Rate controlled at this time, patient heart rate fluctuates between 80 and 100. - Cardiology consulted, appreciate ongoing recommendations - Full dose Eliquis paused starting today (5/5) given upcoming biopsy 5/7, will restart 5/8 - Continue metoprolol 25 mg twice daily

## 2023-01-14 DIAGNOSIS — J9601 Acute respiratory failure with hypoxia: Secondary | ICD-10-CM | POA: Diagnosis not present

## 2023-01-14 LAB — BASIC METABOLIC PANEL
Anion gap: 12 (ref 5–15)
BUN: 17 mg/dL (ref 8–23)
CO2: 28 mmol/L (ref 22–32)
Calcium: 8.9 mg/dL (ref 8.9–10.3)
Chloride: 99 mmol/L (ref 98–111)
Creatinine, Ser: 0.86 mg/dL (ref 0.44–1.00)
GFR, Estimated: >60 mL/min (ref >60)
Glucose, Bld: 109 mg/dL — ABNORMAL HIGH (ref 70–99)
Potassium: 3.8 mmol/L (ref 3.5–5.1)
Sodium: 139 mmol/L (ref 135–145)

## 2023-01-14 LAB — MAGNESIUM: Magnesium: 2.2 mg/dL (ref 1.7–2.4)

## 2023-01-14 MED ORDER — MENTHOL 3 MG MT LOZG
1.0000 | LOZENGE | OROMUCOSAL | Status: DC | PRN
  Administered 2023-01-14 – 2023-01-15 (×3): 3 mg via ORAL
  Filled 2023-01-14: qty 9

## 2023-01-14 MED ORDER — LORAZEPAM 0.5 MG PO TABS
0.5000 mg | ORAL_TABLET | Freq: Once | ORAL | Status: AC | PRN
  Administered 2023-01-15: 0.5 mg via ORAL
  Filled 2023-01-14: qty 1

## 2023-01-14 MED ORDER — HYDROXYZINE HCL 10 MG PO TABS
10.0000 mg | ORAL_TABLET | Freq: Three times a day (TID) | ORAL | Status: DC
  Administered 2023-01-14 – 2023-01-15 (×2): 10 mg via ORAL
  Filled 2023-01-14 (×2): qty 1

## 2023-01-14 MED ORDER — HYDROXYZINE HCL 10 MG PO TABS
10.0000 mg | ORAL_TABLET | Freq: Once | ORAL | Status: AC
  Administered 2023-01-14: 10 mg via ORAL
  Filled 2023-01-14: qty 1

## 2023-01-14 MED ORDER — FENTANYL 25 MCG/HR TD PT72
1.0000 | MEDICATED_PATCH | TRANSDERMAL | Status: DC
  Administered 2023-01-14: 1 via TRANSDERMAL
  Filled 2023-01-14: qty 1

## 2023-01-14 MED ORDER — NALOXONE HCL 0.4 MG/ML IJ SOLN: 0.4000 mg | INTRAMUSCULAR | Status: DC | PRN

## 2023-01-14 MED ORDER — IPRATROPIUM-ALBUTEROL 0.5-2.5 (3) MG/3ML IN SOLN
3.0000 mL | RESPIRATORY_TRACT | Status: DC | PRN
  Administered 2023-01-15 – 2023-02-07 (×3): 3 mL via RESPIRATORY_TRACT
  Filled 2023-01-14 (×3): qty 3

## 2023-01-14 NOTE — Progress Notes (Signed)
Mobility Specialist Progress Note   01/14/23 1514  Mobility  Activity Ambulated with assistance in hallway  Level of Assistance Minimal assist, patient does 75% or more  Assistive Device Front wheel walker  Distance Ambulated (ft) 22 ft  Activity Response Tolerated poorly  Mobility Referral Yes  $Mobility charge 1 Mobility   Received in bed having no complaints and agreeable to ambulatory O2 saturation test. Stand by assist to EOB then placed on room air, SpO2 floating at 82%. Applied 2LO2 per advisement of RN and pt floating around 87%, applied 4LO2 and pt maintaining =/>91% SpO2. Pt able to make it out in the hallway w/ CGA but session cut short by progressive DOE secondary to anxiety. Sat pt in chair and rolled chair back to room w/o fault. Placed pt back in bed w/ +2A for safety. Call bell in reach and RN notified.    Frederico Hamman Mobility Specialist Please contact via SecureChat or  Rehab office at [phone]

## 2023-01-14 NOTE — Progress Notes (Signed)
Mobility Specialist Progress Note  Nurse requested Mobility Specialist to perform oxygen saturation test with pt which includes removing pt from oxygen both at rest and while ambulating.  Below are the results from that testing.     Patient Saturations on Room Air at Rest = SpO2 82%  Patient Saturations on 2 Liters of oxygen while at rest = SpO2 87%  Patient Saturations on 4 Liters of oxygen while Ambulating = SpO2 94%  At end of testing pt left in room on 4  Liters of oxygen.  Reported results to nurse.   [name] Mobility Specialist Please contact via SecureChat or  Rehab office at [phone]

## 2023-01-14 NOTE — Evaluation (Signed)
Occupational Therapy Evaluation Patient Details Name: [name] MRN: [id number] DOB: 05/28/1956 Today's Date: 01/14/2023   History of Present Illness Pt is a 67 y.o. female admitted 01/11/23 with acute hypoxic respiratory failure secondary to post-obstructive RLL PNA. Course complicated by anxiety. Plan for biopsy 5/7. PMH includes R hilar mass (concern for recurrent NSCLC), COPD, afib, panic attacks/anxiety.   Clinical Impression   Pt reports independence at baseline with ADLs and functional mobility, lives alone. Pt needing set up -min A for ADLs, mod I for bed mobility and min guard for step pivot transfer without AD. Pt educated on energy conservation strategies for ADLs (handout already in room). SpO2 86-93% on 4L O2 throughout session. Pt presenting with impairments listed below, will follow acutely. Recommend HHOT at d/c.      Recommendations for follow up therapy are one component of a multi-disciplinary discharge planning process, led by the attending physician.  Recommendations may be updated based on patient status, additional functional criteria and insurance authorization.   Assistance Recommended at Discharge Set up Supervision/Assistance  Patient can return home with the following A little help with bathing/dressing/bathroom;Assistance with cooking/housework;Direct supervision/assist for medications management;Direct supervision/assist for financial management;Assist for transportation;Help with stairs or ramp for entrance    Functional Status Assessment  Patient has had a recent decline in their functional status and demonstrates the ability to make significant improvements in function in a reasonable and predictable amount of time.  Equipment Recommendations  Tub/shower seat    Recommendations for Other Services PT consult     Precautions / Restrictions Precautions Precautions: Fall;Other (comment) Precaution Comments: watch Spo2 (wears 2L O2  baseline) Restrictions Weight Bearing Restrictions: No      Mobility Bed Mobility Overal bed mobility: Modified Independent                  Transfers Overall transfer level: Needs assistance Equipment used: None Transfers: Bed to chair/wheelchair/BSC, Sit to/from Stand Sit to Stand: Min guard     Step pivot transfers: Min guard            Balance Overall balance assessment: Needs assistance Sitting-balance support: No upper extremity supported, Feet supported Sitting balance-Leahy Scale: Good     Standing balance support: No upper extremity supported, During functional activity Standing balance-Leahy Scale: Fair                             ADL either performed or assessed with clinical judgement   ADL Overall ADL's : Needs assistance/impaired Eating/Feeding: Set up;Sitting   Grooming: Set up;Sitting   Upper Body Bathing: Minimal assistance;Sitting   Lower Body Bathing: Minimal assistance;Sitting/lateral leans   Upper Body Dressing : Minimal assistance;Sitting   Lower Body Dressing: Minimal assistance;Sitting/lateral leans   Toilet Transfer: Min guard;Stand-pivot;BSC/3in1   Toileting- Clothing Manipulation and Hygiene: Supervision/safety       Functional mobility during ADLs: Min guard       Vision   Vision Assessment?: No apparent visual deficits     Perception Perception Perception Tested?: No   Praxis Praxis Praxis tested?: Not tested    Pertinent Vitals/Pain Pain Assessment Pain Assessment: Faces Pain Score: 9  Faces Pain Scale: Hurts whole lot Pain Location: headache Pain Descriptors / Indicators: Discomfort Pain Intervention(s): Limited activity within patient's tolerance, Monitored during session, Repositioned     Hand Dominance Left   Extremity/Trunk Assessment Upper Extremity Assessment Upper Extremity Assessment: Overall WFL for tasks assessed   Lower Extremity  Assessment Lower Extremity Assessment:  Defer to PT evaluation   Cervical / Trunk Assessment Cervical / Trunk Assessment: Normal   Communication Communication Communication: No difficulties   Cognition Arousal/Alertness: Awake/alert Behavior During Therapy: WFL for tasks assessed/performed, Flat affect Overall Cognitive Status: Within Functional Limits for tasks assessed                                       General Comments  SpO2 down to 86% at lowest on 4L O2, cues for breathing technique and activity pacing    Exercises     Shoulder Instructions      Home Living Family/patient expects to be discharged to:: Private residence Living Arrangements: Alone Available Help at Discharge: Family;Available PRN/intermittently Type of Home: Apartment Home Access: Elevator     Home Layout: One level     Bathroom Shower/Tub: Chief Strategy Officer: Handicapped height     Home Equipment: Surveyor, minerals - single point   Additional Comments: daughter's family lives nearby      Prior Functioning/Environment Prior Level of Function : Independent/Modified Independent             Mobility Comments: mod indep with intermittent use of SPC. pt has her license but daughter drives. wears 2L O2 baseline ADLs Comments: pt reports mod indep managing household, though sometimes limited by DOE/fatigue        OT Problem List: Decreased strength;Decreased range of motion;Decreased activity tolerance;Cardiopulmonary status limiting activity      OT Treatment/Interventions: Self-care/ADL training;Therapeutic exercise;Energy conservation;DME and/or AE instruction;Therapeutic activities;Patient/family education;Balance training    OT Goals(Current goals can be found in the care plan section) Acute Rehab OT Goals Patient Stated Goal: none stated OT Goal Formulation: With patient Time For Goal Achievement: 01/28/23 Potential to Achieve Goals: Good ADL Goals Pt Will Perform Upper Body Dressing:  Independently;sitting Pt Will Perform Lower Body Dressing: Independently;sit to/from stand;sitting/lateral leans Pt Will Transfer to Toilet: Independently;ambulating;regular height toilet Pt Will Perform Tub/Shower Transfer: Tub transfer;Shower transfer;Independently;ambulating Additional ADL Goal #1: pt will verbalize x3 energy conservation strategies in prep for ADLs  OT Frequency: Min 1X/week    Co-evaluation              AM-PAC OT "6 Clicks" Daily Activity     Outcome Measure Help from another person eating meals?: None Help from another person taking care of personal grooming?: A Little Help from another person toileting, which includes using toliet, bedpan, or urinal?: A Little Help from another person bathing (including washing, rinsing, drying)?: A Little Help from another person to put on and taking off regular upper body clothing?: A Little Help from another person to put on and taking off regular lower body clothing?: A Little 6 Click Score: 19   End of Session Equipment Utilized During Treatment: Oxygen (4L) Nurse Communication: Mobility status  Activity Tolerance: Patient tolerated treatment well Patient left: in bed;with call bell/phone within reach;with bed alarm set  OT Visit Diagnosis: Unsteadiness on feet (R26.81);Other abnormalities of gait and mobility (R26.89);Muscle weakness (generalized) (M62.81)                Time: 3086-5784 OT Time Calculation (min): 19 min Charges:  OT General Charges $OT Visit: 1 Visit OT Evaluation $OT Eval Moderate Complexity: 1 Mod  Arsenia Goracke K, OTD, OTR/L SecureChat Preferred Acute Rehab [phone]   Shepherd Finnan K Koonce 01/14/2023, 2:25 PM

## 2023-01-14 NOTE — Progress Notes (Signed)
FMTS Brief Progress Note  S: Patient awake in bed, sitting upright with audible SOB. Reports her anxiety is again back and overwhelming. Does not think the atarax given earlier is taking the edge off. Has productive cough.   O: BP (!) 145/90 (BP Location: Right Arm)   Pulse 98   Temp (!) 97.4 F (36.3 C) (Oral)   Resp 20   Ht 5\' 8"  (1.727 m)   Wt 79.2 kg   SpO2 92%   BMI 26.55 kg/m    A/P: Anxiety - ordered ativan 0.5 mg once PRN  Chronic pain from nonhealing rib fracture Patient reports she lost her fentanyl patch, it came off in the covers and she placed it on bedside table.  - messaged RN to check over patient and re-place a fentanyl patch, secure it well with Tegaderm and tape - ativan injection PRN for opioid reversal for signs of respiratory depression (although has tolerated this dose of fentanyl patch well outpatient for about 6 months without issue) - Orders reviewed. Labs for AM ordered, which was adjusted as needed.    Fayette Pho, MD 01/14/2023, 7:50 PM PGY-3, [facility] Night Resident  Please page [phone] with questions.

## 2023-01-14 NOTE — Progress Notes (Signed)
Notified by nursing that pt was having Panic attack and preferred to stay tonight. Went to eval pt, she was sitting up in bed, reported that she was having a panic attack. Given that pt is still above baseline O2, it is reasonable to continue hospitalization and continue to wean O2. Discussed that SOB and anxiety are likely exacerbating each other.  - Will make Atarax schedule TID.

## 2023-01-14 NOTE — Evaluation (Signed)
Physical Therapy Evaluation Patient Details Name: [name] MRN: [id number] DOB: Apr 22, 1956 Today's Date: 01/14/2023  History of Present Illness  Pt is a 67 y.o. female admitted 01/11/23 with acute hypoxic respiratory failure secondary to post-obstructive RLL PNA. Course complicated by anxiety. Plan for biopsy 5/7. PMH includes R hilar mass (concern for recurrent NSCLC), COPD, afib, panic attacks/anxiety.   Clinical Impression  Pt presents with an overall decrease in functional mobility secondary to above. PTA, pt mod indep with intermittent use of SPC, lives alone, daughter lives nearby and does the driving. Today, pt's stability improved with rollator use; distance limited by fatigue and DOE requiring prolonged seated rest breaks between activity bouts; pt demonstrates good awareness of activity pacing. Initiated educ re: activity recommendations, energy conservation strategies, pulmonary hygiene, importance of upright/OOB mobility. Pt would benefit from continued acute PT services to maximize functional mobility and independence prior to d/c home.     SpO2 >/93% on 4L O2 [place] (pt wears 2L O2 baseline)    Recommendations for follow up therapy are one component of a multi-disciplinary discharge planning process, led by the attending physician.  Recommendations may be updated based on patient status, additional functional criteria and insurance authorization.   Assistance Recommended at Discharge Intermittent Supervision/Assistance  Patient can return home with the following  Assistance with cooking/housework;Assist for transportation    Equipment Recommendations Rollator (4 wheels)  Recommendations for Other Services       Functional Status Assessment Patient has had a recent decline in their functional status and demonstrates the ability to make significant improvements in function in a reasonable and predictable amount of time.     Precautions / Restrictions Precautions Precautions:  Fall;Other (comment) Precaution Comments: watch Spo2 (wears 2L O2 baseline) Restrictions Weight Bearing Restrictions: No      Mobility  Bed Mobility Overal bed mobility: Modified Independent                  Transfers Overall transfer level: Needs assistance Equipment used: None, Rollator (4 wheels) Transfers: Sit to/from Stand Sit to Stand: Supervision           General transfer comment: initial sit<>stand from EOB without DME; additional trial with rollator, educ on hand placement and brake function    Ambulation/Gait Ambulation/Gait assistance: Min guard, Min assist Gait Distance (Feet): 40 Feet Assistive device: None, Rollator (4 wheels) Gait Pattern/deviations: Step-through pattern, Decreased stride length Gait velocity: Decreased     General Gait Details: initial ambulation without DME and min guard, 1x LOB requiring minA to correct, pt preference for reaching to furniture/sink for UE support. prolonged seated rest break secondary to fatigue and SOB, then additional gait trial with rollator, stability much improved. pt declined hallway ambulation or further distance secondary to fatigue  Stairs            Wheelchair Mobility    Modified Rankin (Stroke Patients Only)       Balance Overall balance assessment: Needs assistance Sitting-balance support: No upper extremity supported, Feet supported Sitting balance-Leahy Scale: Good     Standing balance support: No upper extremity supported, During functional activity Standing balance-Leahy Scale: Fair Standing balance comment: stability improved with RW                             Pertinent Vitals/Pain Pain Assessment Pain Assessment: Faces Faces Pain Scale: Hurts a little bit Pain Location: chest Pain Descriptors / Indicators: Discomfort Pain Intervention(s): Monitored during  session    Home Living Family/patient expects to be discharged to:: Private residence Living  Arrangements: Alone Available Help at Discharge: Family;Available PRN/intermittently Type of Home: Apartment Home Access: Elevator       Home Layout: One level Home Equipment: Toilet riser;Cane - single point Additional Comments: daughter's family lives nearby    Prior Function Prior Level of Function : Independent/Modified Independent             Mobility Comments: mod indep with intermittent use of SPC. pt has her license but daughter drives. wears 2L O2 baseline ADLs Comments: pt reports mod indep managing household, though sometimes limited by DOE/fatigue     Hand Dominance        Extremity/Trunk Assessment   Upper Extremity Assessment Upper Extremity Assessment: Overall WFL for tasks assessed    Lower Extremity Assessment Lower Extremity Assessment: Generalized weakness       Communication   Communication: No difficulties  Cognition Arousal/Alertness: Awake/alert Behavior During Therapy: WFL for tasks assessed/performed, Flat affect Overall Cognitive Status: Within Functional Limits for tasks assessed                                          General Comments General comments (skin integrity, edema, etc.): SpO2 >/92% on 4L O2 [place]. educ re: role of acute PT, POC, activity recommendations, pulmonary hygiene, importance of upright & OOB mobility, energy conservation strategies (handout provided). educ on use of rollator for added stability and energy conservation, pt is interested in one for discharge if an option. pt in agreement she does not feel she requires HHPT services    Exercises     Assessment/Plan    PT Assessment Patient needs continued PT services  PT Problem List Decreased strength;Decreased activity tolerance;Decreased balance;Decreased mobility;Cardiopulmonary status limiting activity;Decreased knowledge of use of DME       PT Treatment Interventions DME instruction;Gait training;Functional mobility training;Therapeutic  activities;Therapeutic exercise;Balance training;Patient/family education    PT Goals (Current goals can be found in the Care Plan section)  Acute Rehab PT Goals Patient Stated Goal: return home PT Goal Formulation: With patient Time For Goal Achievement: 01/28/23 Potential to Achieve Goals: Good    Frequency Min 1X/week     Co-evaluation               AM-PAC PT "6 Clicks" Mobility  Outcome Measure Help needed turning from your back to your side while in a flat bed without using bedrails?: None Help needed moving from lying on your back to sitting on the side of a flat bed without using bedrails?: None Help needed moving to and from a bed to a chair (including a wheelchair)?: A Little Help needed standing up from a chair using your arms (e.g., wheelchair or bedside chair)?: A Little Help needed to walk in hospital room?: A Little Help needed climbing 3-5 steps with a railing? : A Little 6 Click Score: 20    End of Session Equipment Utilized During Treatment: Oxygen Activity Tolerance: Patient tolerated treatment well;Patient limited by fatigue Patient left: in bed;with call bell/phone within reach Nurse Communication: Mobility status PT Visit Diagnosis: Other abnormalities of gait and mobility (R26.89);Muscle weakness (generalized) (M62.81)    Time: 1610-9604 PT Time Calculation (min) (ACUTE ONLY): 21 min   Charges:   PT Evaluation $PT Eval Moderate Complexity: 1 Mod        Ina Homes, PT, DPT  Acute Rehabilitation Services  Personal: Secure Chat Rehab Office: [phone]  Malachy Chamber 01/14/2023, 1:48 PM

## 2023-01-14 NOTE — Progress Notes (Addendum)
Patient experiencing shortness of breath with wheezing on exertion, oxygen saturation at 75% on 4L [place]. Oxygen placed on 5L with no improvement. Oxygen currently on 6L [facility] with oxygen saturation at 92%. Will continue to monitor. Anxiety medication given.  Kenard Gower, RN

## 2023-01-14 NOTE — Progress Notes (Addendum)
     Daily Progress Note Intern Pager: [phone]  Patient name: [name] Medical record number: [id number] Date of birth: 04-14-1956 Age: 67 y.o. Gender: female  Primary Care Provider: Fayette Pho, MD Consultants: Cardiology, Pulmonology  Code Status: DNR  Pt Overview and Major Events to Date:  5/4 Admitted  Assessment and Plan: CM is a 67yo F w/ hx of COPD, recurrent adenocarcinoma of LLL, vocal cord dysfunction, and panic/anxiety that is admitted for AHRF in setting of CAP.   * Acute hypoxic respiratory failure (HCC) Lungs clear on exam, but pt has notable stridor which is likely her vocal cord dysfunction that is exacerbated by anxiety. Will continue to wean O2 as tolerated. - Cont Azithro x3day course (5/2-5/4) - Cont Cefdinir x5 day course (452- ) - s/p Solumedrol (5/2-5/3)  - Sch duonebs to prn q4h - Cont formulary equiv of home Breztri - Pulmonology following, appreciate recs - Treat anxiety as below  Atrial fibrillation [facility]) - Cardiology consulted, appreciate recs - Cont Eliquis BID (Ok to be held for upcoming bronch and colonoscopy procedures) - Cont Metop 25 BID - Amlo was switched to Diltiazem 120mg  daily  Anxiety - Cont Remeron 15mg  nightly  - Sch Atarax BID - Cont home Celexa    FEN/GI: Regular PPx: Eliquis Dispo:Pending PT recommendations  tomorrow. Barriers include increased O2 requirement.   Subjective:  Reports still having a lot of anxiety. Pt wants to go home, but also understands that we need to wean O2 requirement more.   Objective: Temp:  [97.7 F (36.5 C)-98.5 F (36.9 C)] 97.9 F (36.6 C) (05/04 0752) Pulse Rate:  [91-116] 99 (05/04 0446) Resp:  [20-23] 20 (05/04 0752) BP: (122-159)/(60-103) 126/60 (05/04 0752) SpO2:  [80 %-96 %] 92 % (05/04 1478) Physical Exam: General: Alert, pleasant woman. Able to ambulate to bedside commode. Speaking in full sentences.  Cardiovascular: RRR, no murmurs.  Respiratory: Biphasic upper airway  stridor. CTAB, good air movement throughout and no wheezing or crackles. Pt became uncomfortable when trying to wean O2. Pulse ox hanging on ear lobe and not reading properly.  Abdomen: Soft, nontender, nondistended. Normal BS.  Extremities: Moves all ext spontaneously   Laboratory: Most recent CBC Lab Results  Component Value Date   WBC 16.8 (H) 01/11/2023   HGB 13.2 01/11/2023   HCT 41.7 01/11/2023   MCV 86.2 01/11/2023   PLT 426 (H) 01/11/2023   Most recent BMP    Latest Ref Rng & Units 01/14/2023    1:31 AM  BMP  Glucose 70 - 99 mg/dL 295   BUN 8 - 23 mg/dL 17   Creatinine 6.21 - 1.00 mg/dL 3.08   Sodium 657 - 846 mmol/L 139   Potassium 3.5 - 5.1 mmol/L 3.8   Chloride 98 - 111 mmol/L 99   CO2 22 - 32 mmol/L 28   Calcium 8.9 - 10.3 mg/dL 8.9    Lincoln Brigham, MD 01/14/2023, 10:36 AM  PGY-1, [facility] FPTS Intern pager: [phone], text pages welcome Secure chat group [facility] [employer] Teaching Service

## 2023-01-14 NOTE — Discharge Summary (Deleted)
Family Medicine Teaching [facility] Discharge Summary  Patient name: [name] Medical record number: [id number] Date of birth: 1956/02/18 Age: 67 y.o. Gender: female Date of Admission: 01/11/2023  Date of Discharge: 01/14/23 Admitting Physician: Westley Chandler, MD  Primary Care Provider: Fayette Pho, MD Consultants: Cardiology, Pulmonology   Indication for Hospitalization: Acute Hypoxic Respiratory Failure  Brief Hospital Course:  [name] is a 67 y.o.female with a history of hx of COPD, recurrent adenocarcinoma of LLL, and panic/anxiety that is admitted for AHRF in setting of CAP to [facility] Teaching Service at [facility]. Her hospital course is detailed below:  Acute Hypoxic Respiratory Failure  COPD Exacerbation  CAP  Anxiety  Vocal Cord Dysfunction  Pt presented to ED for N/V, abm pain, and diarrhea for 2 wks. Abm exam benign, transaminases wnl, and lipase wnl. CTAP and CXR did show developing PNA of RLL. Patient also tachypneic and had increased O2 requirement (Baseline 2L [place], needing up to 8L [place]NC).  Pulmonology was consulted.  Patient also had panic attacks/anxiety that exacerbated work of breathing and vocal cord dysfunction, leading to stridor.  AHRF thought to be multifactorial including CAP, COPD exacerbation, and vocal cord dysfunction. Patient started on ceftriaxone and azithromycin for CAP coverage.  Patient started on DuoNebs and Solu-Medrol for COPD exacerbation.  Patient was continued on formulary equivalent of home Breztri. Patient was continued on home Celexa and Atarax, and mirtazapine was added to better control anxiety.  Patient completed 3-day course of azithromycin and ceftriaxone was transitioned to cefdinir to finish 5-day total course. At discharge, pt was satting well on 4L [place], afebrile throughout entire admission, ambulating, and tolerating PO.   To further workup potential recurrence of lung cancer, patient is scheduled for outpatient bronchoscopy with  pulmonology on 4/7.  COVID test while inpatient was negative 5/3.  New onset Afib w/ RVR (back to NSR at discharge) Patient initially in sinus tachycardia in ED, then noted to be in A-fib with RVR (HR ~120-130).  Cardiology was consulted, and patient started on metoprolol.  Home amlodipine was transition to diltiazem.  Echo unremarkable. CHA2DS2-VASc = 4 (aortic atherosclerosis noted on CT, HTN, age, female). Patient was started on Eliquis twice daily, which is okay to hold for upcoming outpatient bronchoscopy on 5/7.  At time of discharge, patient tachy to HR ~90-110 and in normal sinus rhythm. Pt instructed to make outpt follow-up with Cardiology to assess Afib burden.   Other chronic conditions were medically managed with home medications and formulary alternatives as necessary   PCP Follow-up Recommendations: 1) Started Mirtazipine for anxiety and decreased appetite. F/u symptoms and titrate as needed. 2) Follow-up on O2 requirement and ability to wean. Pt was discharged on 4L, but was on 2L baseline prior to admission. 3) Ensure outpt Cardiology follow-up for Afib RVR during admission (back to NSR at discharge).  Discharge Diagnoses/Problem List:  AHRF New Afib RVR (back to NSR at discharge) Anxiety  Disposition: Home  Discharge Condition: Improved   Discharge Exam:  General: Alert, pleasant woman. Able to ambulate to bedside commode. Speaking in full sentences.  Cardiovascular: RRR, no murmurs.  Respiratory: Biphasic upper airway stridor. CTAB, good air movement throughout and no wheezing or crackles. Pt became uncomfortable when trying to wean O2. Pulse ox hanging on ear lobe and not reading properly.  Abdomen: Soft, nontender, nondistended. Normal BS.  Extremities: Moves all ext spontaneously   Significant Procedures: none  Significant Labs and Imaging:  No results for input(s): "WBC", "HGB", "  HCT", "PLT" in the last 48 hours. Recent Labs  Lab 01/13/23 0201 01/14/23 0131   NA 138 139  K 4.1 3.8  CL 99 99  CO2 27 28  GLUCOSE 142* 109*  BUN 9 17  CREATININE 0.81 0.86  CALCIUM 8.8* 8.9  MG 2.1 2.2    Results/Tests Pending at Time of Discharge: none  Discharge Medications:  Allergies as of 01/14/2023       Reactions   Codeine Nausea And Vomiting, Other (See Comments)   Hallucinations   Prednisone Other (See Comments)   'Made me feel funny inside my head"-pt cannot be specific        Medication List     STOP taking these medications    amoxicillin-clavulanate 875-125 MG tablet Commonly known as: AUGMENTIN       TAKE these medications    albuterol 108 (90 Base) MCG/ACT inhaler Commonly known as: VENTOLIN HFA USE 2 INHALATIONS BY MOUTH  INTO THE LUNGS EVERY 6  HOURS AS NEEDED FOR  SHORTNESS OF BREATH OR  WHEEZING What changed:  how much to take how to take this when to take this reasons to take this   alendronate 70 MG tablet Commonly known as: FOSAMAX Take 1 tablet (70 mg total) by mouth every 7 (seven) days. Take with a full glass of water on an empty stomach.   amLODipine 10 MG tablet Commonly known as: NORVASC Take 1 tablet (10 mg total) by mouth daily.   atorvastatin 40 MG tablet Commonly known as: LIPITOR TAKE 1 TABLET BY MOUTH AT  BEDTIME   baclofen 10 MG tablet Commonly known as: LIORESAL Take 0.5 tablets (5 mg total) by mouth 3 (three) times daily.   Breztri Aerosphere 160-9-4.8 MCG/ACT Aero Generic drug: Budeson-Glycopyrrol-Formoterol Inhale 2 puffs into the lungs in the morning and at bedtime.   calcium-vitamin D 500-5 MG-MCG tablet Commonly known as: OSCAL WITH D Take 1 tablet by mouth daily with breakfast.   cefdinir 300 MG capsule Commonly known as: OMNICEF Take 1 capsule (300 mg total) by mouth every 12 (twelve) hours.   citalopram 10 MG tablet Commonly known as: CeleXA Take 1 tablet (10 mg total) by mouth daily. Follow up in 3-4 weeks for side effect check.   diclofenac Sodium 1 % Gel Commonly  known as: Voltaren Apply 2 g topically 4 (four) times daily. Apply to painful area of ribs. Need to use regularly for relief.   diltiazem 120 MG 24 hr capsule Commonly known as: CARDIZEM CD Take 1 capsule (120 mg total) by mouth daily.   Eliquis 5 MG Tabs tablet Generic drug: apixaban Take 1 tablet (5 mg total) by mouth 2 (two) times daily.   fentaNYL 25 MCG/HR Commonly known as: DURAGESIC Place 1 patch onto the skin every 3 (three) days.   fluticasone 50 MCG/ACT nasal spray Commonly known as: FLONASE SHAKE LIQUID AND USE 2 SPRAYS IN EACH NOSTRIL DAILY   gabapentin 600 MG tablet Commonly known as: Neurontin Take 1 tablet (600 mg total) by mouth 3 (three) times daily.   hydrOXYzine 50 MG tablet Commonly known as: ATARAX Take 2 tablets (100 mg total) by mouth at bedtime as needed for anxiety or itching (sleep).   ibuprofen 800 MG tablet Commonly known as: ADVIL Take 1 tablet (800 mg total) by mouth every 8 (eight) hours as needed. What changed: reasons to take this   lidocaine 5 % Commonly known as: Lidoderm Place 1 patch onto the skin daily. Remove & Discard patch  within 12 hours or as directed by MD   MELATONIN GUMMIES PO Take 10 mg by mouth at bedtime.   metoprolol tartrate 25 MG tablet Commonly known as: LOPRESSOR Take 1 tablet (25 mg total) by mouth 2 (two) times daily.   mirtazapine 15 MG tablet Commonly known as: REMERON Take 1 tablet (15 mg total) by mouth at bedtime.   naloxone 4 MG/0.1ML Liqd nasal spray kit Commonly known as: NARCAN Use for suspected opioid overdose - too sleepy to wake up, difficulty breathing   ondansetron 4 MG disintegrating tablet Commonly known as: ZOFRAN-ODT Take 1 tablet (4 mg total) by mouth every 8 (eight) hours as needed for nausea or vomiting.   ondansetron 4 MG tablet Commonly known as: ZOFRAN Take 1 tablet (4 mg total) by mouth every 8 (eight) hours as needed for nausea or vomiting. Take one tablet before starting each  prep.   pantoprazole 40 MG tablet Commonly known as: PROTONIX Take 1 tablet (40 mg total) by mouth daily as needed (acid indigestion/heartburn.).   polyethylene glycol powder 17 GM/SCOOP powder Commonly known as: GLYCOLAX/MIRALAX Take 17 g by mouth 2 (two) times daily as needed. What changed: reasons to take this   senna 8.6 MG Tabs tablet Commonly known as: SENOKOT Take 1 tablet (8.6 mg total) by mouth at bedtime.   triamcinolone cream 0.1 % Commonly known as: KENALOG Apply 1 Application topically 2 (two) times daily.   Vitamin D 50 MCG (2000 UT) tablet Take 1 tablet (2,000 Units total) by mouth daily.        Discharge Instructions: Please refer to Patient Instructions section of EMR for full details.  Patient was counseled important signs and symptoms that should prompt return to medical care, changes in medications, dietary instructions, activity restrictions, and follow up appointments.   Follow-Up Appointments: 5/7 Bronchospcopy   5/17 10:30 AM [facility]  Lincoln Brigham, MD 01/14/2023, 4:08 PM PGY-1, [facility] Family Medicine

## 2023-01-14 NOTE — Progress Notes (Signed)
Tele reviewed. Remains in sinus tachycardia with no recurrence of Afib. BP better controlled this morning. No changes at this time.  Laurance Flatten, MD

## 2023-01-15 ENCOUNTER — Inpatient Hospital Stay (HOSPITAL_COMMUNITY): Payer: 59

## 2023-01-15 DIAGNOSIS — F419 Anxiety disorder, unspecified: Secondary | ICD-10-CM

## 2023-01-15 DIAGNOSIS — J9601 Acute respiratory failure with hypoxia: Secondary | ICD-10-CM | POA: Diagnosis not present

## 2023-01-15 LAB — BASIC METABOLIC PANEL
Anion gap: 13 (ref 5–15)
BUN: 11 mg/dL (ref 8–23)
CO2: 31 mmol/L (ref 22–32)
Calcium: 8.9 mg/dL (ref 8.9–10.3)
Chloride: 95 mmol/L — ABNORMAL LOW (ref 98–111)
Creatinine, Ser: 0.84 mg/dL (ref 0.44–1.00)
GFR, Estimated: >60 mL/min (ref >60)
Glucose, Bld: 97 mg/dL (ref 70–99)
Potassium: 3.5 mmol/L (ref 3.5–5.1)
Sodium: 139 mmol/L (ref 135–145)

## 2023-01-15 LAB — CULTURE, BLOOD (SINGLE): Special Requests: ADEQUATE

## 2023-01-15 MED ORDER — MELATONIN 3 MG PO TABS
3.0000 mg | ORAL_TABLET | Freq: Every evening | ORAL | Status: DC | PRN
  Administered 2023-01-15 – 2023-01-22 (×2): 3 mg via ORAL
  Filled 2023-01-15 (×3): qty 1

## 2023-01-15 MED ORDER — MIRTAZAPINE 15 MG PO TABS
15.0000 mg | ORAL_TABLET | Freq: Every day | ORAL | Status: DC
  Administered 2023-01-15: 15 mg via ORAL
  Filled 2023-01-15: qty 1

## 2023-01-15 MED ORDER — MIRTAZAPINE 15 MG PO TABS: 30.0000 mg | ORAL_TABLET | Freq: Every day | ORAL | Status: DC

## 2023-01-15 MED ORDER — IPRATROPIUM-ALBUTEROL 0.5-2.5 (3) MG/3ML IN SOLN: 3.0000 mL | RESPIRATORY_TRACT | Status: DC

## 2023-01-15 MED ORDER — HYDROXYZINE HCL 25 MG PO TABS
50.0000 mg | ORAL_TABLET | Freq: Three times a day (TID) | ORAL | Status: DC
  Administered 2023-01-15 (×2): 50 mg via ORAL
  Filled 2023-01-15 (×2): qty 2

## 2023-01-15 MED ORDER — CALCIUM CARBONATE ANTACID 500 MG PO CHEW
1.0000 | CHEWABLE_TABLET | Freq: Two times a day (BID) | ORAL | Status: DC
  Administered 2023-01-15 (×2): 200 mg via ORAL
  Filled 2023-01-15 (×2): qty 1

## 2023-01-15 MED ORDER — RACEPINEPHRINE HCL 2.25 % IN NEBU
0.5000 mL | INHALATION_SOLUTION | Freq: Once | RESPIRATORY_TRACT | Status: AC
  Administered 2023-01-15: 0.5 mL via RESPIRATORY_TRACT
  Filled 2023-01-15: qty 0.5

## 2023-01-15 MED ORDER — FUROSEMIDE 10 MG/ML IJ SOLN
40.0000 mg | Freq: Once | INTRAMUSCULAR | Status: AC
  Administered 2023-01-15: 40 mg via INTRAVENOUS
  Filled 2023-01-15: qty 4

## 2023-01-15 MED ORDER — GABAPENTIN 300 MG PO CAPS: 600.0000 mg | ORAL_CAPSULE | Freq: Three times a day (TID) | ORAL | Status: DC | PRN

## 2023-01-15 MED ORDER — APIXABAN 5 MG PO TABS: 5.0000 mg | ORAL_TABLET | Freq: Two times a day (BID) | ORAL | Status: DC

## 2023-01-15 NOTE — Progress Notes (Signed)
Daily Progress Note Intern Pager: [phone]  Patient name: AAHANA MOSES Medical record number: [id number] Date of birth: 06-29-1956 Age: 67 y.o. Gender: female  Primary Care Provider: Fayette Pho, MD Consultants: Cardiology, pulmonology, psychiatry Code Status: DNR  Pt Overview and Major Events to Date:  5/2 admitted  Assessment and Plan: Ms. Vivolo is a 67 yo woman admitted for acute on chronic hypoxemic respiratory failure from community-acquired pneumonia and new onset atrial fibrillation.  PMH includes gold stage III COPD recently placed on home O2, recent recurrence of lung adenocarcinoma, suspected vocal cord dysfunction, bladder cancer s/p resection, HLD, HTN.  * Acute hypoxic respiratory failure (HCC) - secondary to CAP Worsened O2 requirement overnight, now requiring 6 to 7 L to maintain SpO2 of 90%.  Lung exam demonstrates transmitted upper airway sounds, no audible wheezing/rhonchi/rales.  Unfortunately severe COPD and anxiety now with CAP and vocal cord dysfunction causing vicious cycle with SOB.  - Will obtain CXR given worsened O2 requirement - SpO2 goal 88-92% with COPD - Continue cefdinir 7-day course (5/3 - 5/9) - Albuterol nebulizer every 2 as needed - DuoNeb every 4 as needed - Revenison nebulizer 175 mcg daily and aformoterol nebulizer 50 mcg twice daily - PCCM available as needed   Atrial fibrillation (HCC) Rate controlled at this time, patient heart rate fluctuates between 80 and 100. - Cardiology consulted, appreciate ongoing recommendations - Full dose Eliquis paused starting today (5/5) given upcoming biopsy 5/7, will restart 5/8 - Continue metoprolol 25 mg twice daily  Anxiety Uncontrolled, contributing to episodes of shortness of breath with large spasm versus vocal cord dysfunction. - Continue Remeron 15 mg nightly - Atarax scheduled 10 mg 3 times daily - Continue home citalopram 10 mg daily - May use Ativan 0.5 mg sparingly for anxiety that is  not responsive to hydroxyzine - Consult to psych for recommendations given desire to avoid benzodiazepines as she has a fentanyl patch    FEN/GI: Regular PPx: SCD while Eliquis paused for biopsy Dispo: Home pending clinical improvement . Barriers include continued need for high flow oxygen.   Subjective:  Patient sitting upright in bed working hard to breathe.  Awake and alert.  Moderate distress.  Reports being awake for the last 2 hours given breathing.  Now requiring 7 L O2 to maintain SpO2 90%.  Objective: Temp:  [97.4 F (36.3 C)-98.2 F (36.8 C)] 97.5 F (36.4 C) (05/05 0607) Pulse Rate:  [98-100] 100 (05/05 0607) Resp:  [20-21] 21 (05/05 0607) BP: (144-160)/(80-90) 144/80 (05/05 0607) SpO2:  [83 %-94 %] 91 % (05/05 0746)  Physical Exam: General: Awake, alert, mildly distressed sitting upright, working to breathe Cardiovascular: Slightly tachycardic in low 100s, regular rhythm, no murmur Respiratory: Diminished lung sounds, no wheezing, rhonchi, or rales auscultated, transmitted upper airway sounds similar to laryngeal spasm or vocal cord dysfunction  Laboratory: Most recent CBC Lab Results  Component Value Date   WBC 16.8 (H) 01/11/2023   HGB 13.2 01/11/2023   HCT 41.7 01/11/2023   MCV 86.2 01/11/2023   PLT 426 (H) 01/11/2023   Most recent BMP    Latest Ref Rng & Units 01/14/2023    1:31 AM  BMP  Glucose 70 - 99 mg/dL 130   BUN 8 - 23 mg/dL 17   Creatinine 8.65 - 1.00 mg/dL 7.84   Sodium 696 - 295 mmol/L 139   Potassium 3.5 - 5.1 mmol/L 3.8   Chloride 98 - 111 mmol/L 99   CO2 22 - 32 mmol/L  28   Calcium 8.9 - 10.3 mg/dL 8.9    Imaging/Diagnostic Tests: Awaiting CXR this morning  Fayette Pho, MD 01/15/2023, 7:54 AM PGY-3, [facility] Family Medicine FPTS Intern pager: [phone], text pages welcome Secure chat group [facility] Teaching Service

## 2023-01-15 NOTE — Progress Notes (Signed)
FMTS Interim Progress Note  S:Notified by nurse that pt was in afib to 130-140s and pt was having stridor. Was refusing EKG at that time. I instructed to give PM metoprolol and atarax dose early. I went to reassess pt an hour later. Pt reports that her anxiety was improving. She asked for something to help with sleep.  O: BP (!) 147/111 (BP Location: Right Arm)   Pulse (!) 138   Temp 98.4 F (36.9 C) (Oral)   Resp (!) 24   Ht 5\' 8"  (1.727 m)   Wt 79.2 kg   SpO2 95%   BMI 26.55 kg/m   Resp: Stridor noted (stable compared to my prior exam). She still has good air movement through.  A/P:  AHRF - RT was consulted and uptitrated O2 needs. Suspect that we can wean back down quickly tonight as anxiety improves. - Repeat CXR shows worsening R pleural eff with more prominent fissure lines, L pleural eff stable.  - Consult pulm vs IR in AM for potential pleuracentesis and fluid studies.   Anxiety - Increased atarax dose seems to be helping, but may take a few days to see benefit from recent med changes. - Added melatonin prn at bedtime  Afib RVR - When I saw pt ~1hr after getting metoprolol, her HR was ~110's.  - Could consider uptitrating metoprolol if she continues to have HR >110's  - Cont rest of plan per dayteam  Lincoln Brigham, MD 01/15/2023, 9:10 PM PGY-1, [facility] Family Medicine Service pager [phone]

## 2023-01-15 NOTE — Progress Notes (Signed)
FMTS Interim Progress Note  S: Altered by nursing staff that patient oxygen requirement is escalating, now on 10L. Patient assessed at bedside, sitting up at edge of bed. Audible stridor breathing with tachypnea. Reports she "just wants to get better". Agrees her anxiety may be playing a role. Would like to try another breathing treatment.  Per nursing patient had breathing treatment this AM with minimal improvement. Concerned she may need BiPap. Discussed cyclical nature of patient breathing. Previously evaluated by Pulm in similar breathing episode and determined not to need BiPap support. Reports she has been sleeping a lot this AM and does not desat in sleep.  O: BP 113/71 (BP Location: Right Arm)   Pulse 80   Temp (!) 97.3 F (36.3 C) (Oral)   Resp (!) 21   Ht 5\' 8"  (1.727 m)   Wt 79.2 kg   SpO2 (!) 89%   BMI 26.55 kg/m    General: Awake, alert, mildly distressed sitting upright, working to breathe Cardiovascular: RRR without murmur Respiratory: Audible stridor breath sounds, crackles at R lung base, increased work of breathing on 10L. No wheezing noted.  A/P: Acute hypoxic respiratory failure Severe COPD Vocal cord dysfunction Oxygen requirement escalating to 10L, increased work of breathing with stridor breath sounds. Consistent with cyclical nature of patient underlying breathing pattern. Received Lasix 40mg  IV, patient reports good diuresis. -Trial breathing treatment and RT evaluation -Continued diuresis, redose Lasix as needed -Consider additional medication for anxiety  Elberta Fortis, MD 01/15/2023, 1:22 PM PGY-1, [facility] Family Medicine Service pager [phone]

## 2023-01-15 NOTE — Consult Note (Signed)
[facility] Psychiatry New Face-to-Face Psychiatric Evaluation   Service Date: Jan 15, 2023 LOS:  LOS: 3 days    Assessment  LETRICIA [name] is a 67 y.o. female admitted medically for 01/11/2023  4:45 PM for acute hypoxic respiratory failure. She carries the psychiatric diagnoses of anxiety and depressionand has a past medical history of  COPD, lung cancer (treated, likely recurred), chronic nonhealing rib fractures, new onset afib w/ RVR, COPD, DDD. Psychiatry was consulted for worsening anxiety by Dr. Larita Fife.    Her current presentation of worsening complaints of anxiety is most consistent with known diagnosis of anxiety. Given pt's SOB I did an abbreviated evaluation - based on discussion with pt I believe the SOB is worsening the anxiety but it is also independently worse after receiving new cancer diagnosis.  Current outpatient psychotropic medications include citalopram 10 mg; she was on gabapentin 300 mg TID but this was not restarted this admission. She is on hydroxyzine 50 mg BID PRN at home but was started on 10 TID PRN this admission.  Historically she has had a fair response to these medications. New this admission are mirtazapine 15 mg. She was agreeable to trial of higher dose of hydroxyzine (this has added benefit of being drying without being overly anticholinergic) and also PRN f/u.     Diagnoses:  Active Hospital problems: Principal Problem:   Acute hypoxic respiratory failure (HCC) - secondary to CAP Active Problems:   Anxiety   Respiratory distress   Pneumonia of right lower lobe due to infectious organism   Atrial fibrillation (HCC)   Protein-calorie malnutrition, severe   Vocal cord dysfunction   Acute respiratory failure with hypoxia (HCC)     Plan  ## Safety and Observation Level:  - Based on my clinical evaluation, I estimate the patient to be at low risk of self harm in the current setting - At this time, we recommend a routine level of observation. This  decision is based on my review of the chart including patient's history and current presentation, interview of the patient, mental status examination, and consideration of suicide risk including evaluating suicidal ideation, plan, intent, suicidal or self-harm behaviors, risk factors, and protective factors. This judgment is based on our ability to directly address suicide risk, implement suicide prevention strategies and develop a safety plan while the patient is in the clinical setting. Please contact our team if there is a concern that risk level has changed.   ## Medications:  -- c citalipram 10  mg -- c mirtazapine 15 mg -- INCREASE hydroxyzine from 10 mg to 50 mg TID PRN  Next step would probably be re-introducing home gabapentin, then would consider increase of mirtazapine (would want to wait another couple of days at least, this is a new med)  ## Medical Decision Making Capacity:  Not formally assessed  ## Further Work-up:  -- none currently    -- most recent EKG on 05/4 had QtC of 446 -- Pertinent labwork reviewed earlier this admission includes: TSH wnl, HIV NR, covid negative   ## Disposition:  -- per primary   Thank you for this consult request. Recommendations have been communicated to the primary team.  We will leave on list at this time - will check in with primary tomorrow to see how she is doing.   [name] A Joushua Dugar   New history   Relevant Aspects of Hospital Course:  Admitted on 01/11/2023 for acute onset respiratory failure. Had significant anxiety throughout stay -  primary team started remeron and scheduled hydroxzyzine. Was supposed to be discharged 5/4, but had significantly worsening SOB and oxygen requirements. Psych consulted AM of 5/5.  Patient Report:  Pt struggling with significant SOB; did mostly close ended questions. She is alert and oriented. Briefly states it is 2004 before going to 2024. Minor difficulties with attention (couldn't do DOWB).  She  endorses significant worsening of anxiety since hospitalization. It had generally been well controlled for a long period of time, gotten a little worse in past month or so, and then gotten drastically worse after being told her cancer might have recurred. Right now she doesn't feel anxious so  much as SOB. Has had minor mood issues over same time frame; difficult to tell if this meets full criteria for depression. She denies any current or lifetime SI ("I have too much to live for"), HI, or AH/VH outside of some hallucinations on high dose prednisone. She feels very well socially connected through her church and family (welcomed first great grandchild recently) despite living alone.   She doesn't know why gabapentin was stopped; thinks it might have been because of nausea.   ROS:  Anxiety SOB Was sleeping better at home.   Collateral information:  None  Psychiatric History:  Information collected from pt, medical record Has had med managed through primary care, feels well taken care of Has never seen a therapist or a psychiatrist. No hx inpt psych hospitalizations.     Social History:  Lives alone Religious, enjoys church community  No guns  Tobacco use: no Alcohol use: no Drug use: no  Family History:  The patient's family history includes Dementia in her father; Diabetes in her daughter; Early death (age of onset: 73) in her sister; Hyperlipidemia in her mother; Hypertension in her daughter and mother; Lung cancer in her father; Parkinson's disease in her father; Throat cancer in her brother.  Medical History: Past Medical History:  Diagnosis Date   Abnormal CT lung screening 07/10/2020   Anxiety    Anxiety state 02/28/2012   GAD7 : 6  PHQ9 : 6   Arthritis    Bladder cancer (HCC) 09/2020   urologist--- dr Berneice Heinrich, incidental finding on PET scan 12/ 2021,  s/p TURBT 09-18-2020 high grade papillary urothelial    Cancer related pain 03/11/2022   CAP (community acquired  pneumonia) 04/20/2022   Centrilobular emphysema (HCC)    Chronic left shoulder pain 04/29/2021   Close exposure to COVID-19 virus 05/03/2019   Constipation 04/09/2019   Costochondritis, acute 08/14/2020   DDD (degenerative disc disease), lumbar    Depression    DOE (dyspnea on exertion)    10-29-2020  per pt can do house work without sob, when walks/ stairs stops frequently  (stated recovers quickly when stops)   Emphysema lung (HCC)    Epidermoid cyst 10/29/2018   Recurrent. Located on left hip at iliac crest 2" anterior to midaxillary line. Removed 2013, 2020, 2022.    Epigastric pain 11/16/2020   Fibroid 2003   GERD (gastroesophageal reflux disease)    History of radiation therapy    Left lung- 10/06/20-10/16/20- Dr. Antony Blackbird   Leg cramping 11/13/2019   Lumbar spine pain 01/09/2015   Menopausal vaginal dryness 11/13/2019   Multiple closed fractures of ribs of left side 08/20/2022   Nocturnal enuresis 06/23/2020   Chronic overnight "leaking" of unknown source. Likely urine, but reports no color or odor. Volume enough for liners or light pads. Occurs every night.  Primary adenocarcinoma of lower lobe of left lung (HCC) 07/2020   oncologist--- dr Arbutus Ped---  dx 11/ 2021 bilateral lower lobe masses,  s/p bronchoscopy w/ bx's 07-28-2020 malignancy cells on left ;  completed SBRT bilateral lower lung 10-16-2020 5 fractions   Radiation-induced dermatitis    on 10-29-2020 pt complaint stomach and chest areas,  completed SBRT 10-16-2020   Seasonal allergies    Stage 3 severe COPD by GOLD classification [facility])    pulmonologist--- dr Tonia Brooms,  using anoro inhaler daily   Wears glasses     Surgical History: Past Surgical History:  Procedure Laterality Date   ANTERIOR CERVICAL DECOMP/DISCECTOMY FUSION  03-08-2003 @ [facility]   C3 --- C6   ARTHROTOMY Right 11/09/2015   Procedure: RIGHT WRIST DISTAL RADIAL ULNAR JOINT ARTHROTOMY AND DEBRIDEMENT,  AND ;  Surgeon: Bradly Bienenstock, MD;  Location: [facility]  OR;  Service: Orthopedics;  Laterality: Right;   BRONCHIAL BIOPSY  07/28/2020   Procedure: BRONCHIAL BIOPSIES;  Surgeon: Josephine Igo, DO;  Location: [facility] ENDOSCOPY;  Service: Pulmonary;;   BRONCHIAL BIOPSY  08/17/2021   Procedure: BRONCHIAL BIOPSIES;  Surgeon: Josephine Igo, DO;  Location: [facility];  Service: Pulmonary;;   BRONCHIAL BRUSHINGS  07/28/2020   Procedure: BRONCHIAL BRUSHINGS;  Surgeon: Josephine Igo, DO;  Location: [facility];  Service: Pulmonary;;   BRONCHIAL BRUSHINGS  08/17/2021   Procedure: BRONCHIAL BRUSHINGS;  Surgeon: Josephine Igo, DO;  Location: [facility];  Service: Pulmonary;;   BRONCHIAL NEEDLE ASPIRATION BIOPSY  07/28/2020   Procedure: BRONCHIAL NEEDLE ASPIRATION BIOPSIES;  Surgeon: Josephine Igo, DO;  Location: [facility];  Service: Pulmonary;;   BRONCHIAL WASHINGS  07/28/2020   Procedure: BRONCHIAL WASHINGS;  Surgeon: Josephine Igo, DO;  Location: [facility];  Service: Pulmonary;;   BRONCHIAL WASHINGS  08/17/2021   Procedure: BRONCHIAL WASHINGS;  Surgeon: Josephine Igo, DO;  Location: [facility];  Service: Pulmonary;;   COLONOSCOPY     CYSTOSCOPY WITH RETROGRADE PYELOGRAM, URETEROSCOPY AND STENT PLACEMENT N/A 09/18/2020   Procedure: CYSTOSCOPY WITH RETROGRADE PYELOGRAM bilateral,URETEROSCOPY AND  STENT PLACEMENT right ureter;  Surgeon: Sebastian Ache, MD;  Location: [facility];  Service: Urology;  Laterality: N/A;  1 HR   HARDWARE REMOVAL Right 07/24/2013   Procedure: RIGHT WRIST HARDWARE REMOVAL, JOINT RELEASE, RIGHT HAND MANIPULATION UNDER ANESTHESIA;  Surgeon: Sharma Covert, MD;  Location: [facility];  Service: Orthopedics;  Laterality: Right;   MASS EXCISION Left 10/14/2021   Procedure: EXCISION LEFT BACK CYSTIC MASS;  Surgeon: Sheliah Hatch, De Blanch, MD;  Location: [facility];  Service: General;  Laterality: Left;   OPEN REDUCTION INTERNAL FIXATION (ORIF) DISTAL RADIAL FRACTURE Right 11/08/2012   Procedure:  OPEN REDUCTION INTERNAL FIXATION (ORIF) RIGHT DISTAL RADIUS FRACTURE;  Surgeon: Sharma Covert, MD;  Location: [facility];  Service: Orthopedics;  Laterality: Right;   TRANSURETHRAL RESECTION OF BLADDER TUMOR N/A 09/18/2020   Procedure: TRANSURETHRAL RESECTION OF BLADDER TUMOR (TURBT);  Surgeon: Sebastian Ache, MD;  Location: [facility];  Service: Urology;  Laterality: N/A;   TRANSURETHRAL RESECTION OF BLADDER TUMOR N/A 11/04/2020   Procedure: RESTAGING TRANSURETHRAL RESECTION OF BLADDER TUMOR (TURBT); BILATERAL RETROGRADE PYELOGRAM;  Surgeon: Sebastian Ache, MD;  Location: [facility];  Service: Urology;  Laterality: N/A;  1 HR   VIDEO BRONCHOSCOPY WITH ENDOBRONCHIAL NAVIGATION N/A 07/28/2020   Procedure: VIDEO BRONCHOSCOPY WITH ENDOBRONCHIAL NAVIGATION;  Surgeon: Josephine Igo, DO;  Location: [facility];  Service: Pulmonary;  Laterality: N/A;  VIDEO BRONCHOSCOPY WITH RADIAL ENDOBRONCHIAL ULTRASOUND  08/17/2021   Procedure: VIDEO BRONCHOSCOPY WITH RADIAL ENDOBRONCHIAL ULTRASOUND;  Surgeon: Josephine Igo, DO;  Location: [facility];  Service: Pulmonary;;   WRIST ARTHROPLASTY Right 11/09/2015   Procedure: OR DISTAL ULNAR RESECTION ARTHROPLASTY ;  Surgeon: Bradly Bienenstock, MD;  Location: [facility] OR;  Service: Orthopedics;  Laterality: Right;   WRIST ARTHROSCOPY WITH DEBRIDEMENT Right 07/21/2014   Procedure: RIGHT WRIST DISTAL RADIAL ULNAR JOINT DEBRIDEMENT AND JOINT RELEASE POSSIBLE TENDON INTERPOSITION;  Surgeon: Sharma Covert, MD;  Location: [facility] OR;  Service: Orthopedics;  Laterality: Right;    Medications:   Current Facility-Administered Medications:    0.9 %  sodium chloride infusion, , Intravenous, PRN, Westley Chandler, MD, Last Rate: 10 mL/hr at 01/12/23 2033, New Bag at 01/12/23 2033   acetaminophen (TYLENOL) tablet 650 mg, 650 mg, Oral, Q6H PRN, 650 mg at 01/15/23 1016 **OR** acetaminophen (TYLENOL) suppository 650 mg, 650 mg, Rectal, Q6H PRN, Tiffany Kocher, DO    albuterol (PROVENTIL) (2.5 MG/3ML) 0.083% nebulizer solution 2.5 mg, 2.5 mg, Nebulization, Q2H PRN, Westley Chandler, MD, 2.5 mg at 01/15/23 0604   arformoterol (BROVANA) nebulizer solution 15 mcg, 15 mcg, Nebulization, BID, Steffanie Dunn, DO, 15 mcg at 01/15/23 0981   atorvastatin (LIPITOR) tablet 40 mg, 40 mg, Oral, QHS, Bronson, Martin, DO, 40 mg at 01/14/23 2125   budesonide (PULMICORT) nebulizer solution 0.25 mg, 0.25 mg, Nebulization, BID, Steffanie Dunn, DO, 0.25 mg at 01/15/23 1914   calcium carbonate (TUMS - dosed in mg elemental calcium) chewable tablet 200 mg of elemental calcium, 1 tablet, Oral, BID, Westley Chandler, MD   cefdinir (OMNICEF) capsule 300 mg, 300 mg, Oral, Q12H, Maxwell, Allee, MD, 300 mg at 01/15/23 1537   citalopram (CELEXA) tablet 10 mg, 10 mg, Oral, Daily, Elberta Fortis, MD, 10 mg at 01/15/23 1015   diltiazem (CARDIZEM CD) 24 hr capsule 120 mg, 120 mg, Oral, Daily, Hochrein, Fayrene Fearing, MD, 120 mg at 01/15/23 1016   fentaNYL (DURAGESIC) 25 MCG/HR 1 patch, 1 patch, Transdermal, Q72H, Westley Chandler, MD, 1 patch at 01/14/23 2125   gabapentin (NEURONTIN) capsule 600 mg, 600 mg, Oral, TID PRN, Alfredo Martinez, MD   hydrocortisone (ANUSOL-HC) 2.5 % rectal cream, , Rectal, BID, Alfredo Martinez, MD, Given at 01/13/23 2147   hydrOXYzine (ATARAX) tablet 50 mg, 50 mg, Oral, TID, Franziska Podgurski A, 50 mg at 01/15/23 1525   ipratropium-albuterol (DUONEB) 0.5-2.5 (3) MG/3ML nebulizer solution 3 mL, 3 mL, Nebulization, Q4H PRN, Dameron, Marisa, DO, 3 mL at 01/15/23 1325   lactose free nutrition (BOOST PLUS) liquid 237 mL, 237 mL, Oral, BID BM, Westley Chandler, MD, 237 mL at 01/14/23 1228   menthol-cetylpyridinium (CEPACOL) lozenge 3 mg, 1 lozenge, Oral, PRN, Lincoln Brigham, MD, 3 mg at 01/15/23 0902   metoprolol tartrate (LOPRESSOR) tablet 25 mg, 25 mg, Oral, BID, Espinoza, Alejandra, DO, 25 mg at 01/15/23 1016   mirtazapine (REMERON) tablet 15 mg, 15 mg, Oral, QHS, Maxwell, Allee,  MD, 15 mg at 01/14/23 2125   multivitamin with minerals tablet 1 tablet, 1 tablet, Oral, Daily, Westley Chandler, MD, 1 tablet at 01/15/23 1016   naloxone [facility]) injection 0.4 mg, 0.4 mg, Intravenous, PRN, Fayette Pho, MD   ondansetron [facility]) injection 4 mg, 4 mg, Intravenous, Q8H PRN, Fondaw, Wylder S, PA, 4 mg at 01/13/23 2143   pantoprazole (PROTONIX) EC tablet 40 mg, 40 mg, Oral, Daily PRN, Tiffany Kocher, DO, 40 mg at 01/13/23 0900  revefenacin (YUPELRI) nebulizer solution 175 mcg, 175 mcg, Nebulization, Daily, Steffanie Dunn, DO, 175 mcg at 01/15/23 1610   sodium chloride (OCEAN) 0.65 % nasal spray 1 spray, 1 spray, Each Nare, PRN, Westley Chandler, MD  Allergies: Allergies  Allergen Reactions   Codeine Nausea And Vomiting and Other (See Comments)    Hallucinations   Prednisone Other (See Comments)    'Made me feel funny inside my head"-pt cannot be specific       Objective  Vital signs:  Temp:  [97.3 F (36.3 C)-98.2 F (36.8 C)] 98.2 F (36.8 C) (05/05 1646) Pulse Rate:  [80-102] 96 (05/05 1646) Resp:  [20-22] 22 (05/05 1646) BP: (113-160)/(71-84) 126/77 (05/05 1646) SpO2:  [83 %-95 %] 94 % (05/05 1646) FiO2 (%):  [70 %] 70 % (05/05 1448)  Psychiatric Specialty Exam:  Presentation  General Appearance: Appropriate for Environment (hunched over, breathing heavily)  Eye Contact:Good  Speech:-- (hoarse, halting in between breaths)  Speech Volume:Normal  Handedness:No data recorded  Mood and Affect  Mood:Anxious  Affect:Congruent; Full Range   Thought Process  Thought Processes:Coherent; Linear; Goal Directed  Descriptions of Associations:Intact  Orientation:Full (Time, Place and Person)  Thought Content:Logical (devoid of delusions or paranoia)  History of Schizophrenia/Schizoaffective disorder:No data recorded Duration of Psychotic Symptoms:No data recorded Hallucinations:Hallucinations: None  Ideas of Reference:None  Suicidal  Thoughts:Suicidal Thoughts: No  Homicidal Thoughts:Homicidal Thoughts: No   Sensorium  Memory:Immediate Good; Recent Good; Remote Good  Judgment:Good  Insight:Fair   Executive Functions  Concentration:Fair  Attention Span:Fair  Recall:Good  Fund of Knowledge:Good  Language:Good   Psychomotor Activity  Psychomotor Activity:Psychomotor Activity: Normal   Assets  Assets:Communication Skills; Desire for Improvement; Social Support   Sleep  Sleep:Sleep: Fair    Physical Exam: Physical Exam Eyes:     Conjunctiva/sclera: Conjunctivae normal.  Pulmonary:     Comments: Increased effort  Skin:    Comments: No rash on visible skin  Neurological:     Mental Status: She is alert and oriented to person, place, and time.     Blood pressure 126/77, pulse 96, temperature 98.2 F (36.8 C), temperature source Oral, resp. rate (!) 22, height 5\' 8"  (1.727 m), weight 79.2 kg, SpO2 94 %. Body mass index is 26.55 kg/m.

## 2023-01-15 NOTE — Progress Notes (Signed)
Pt refused vitals 

## 2023-01-15 NOTE — Progress Notes (Signed)
   01/15/23 2030  Assess: MEWS Score  Temp 98.4 F (36.9 C)  BP (!) 147/111  MAP (mmHg) 122  Pulse Rate (!) 138  Resp (!) 24  O2 Device HFNC  Assess: MEWS Score  MEWS Temp 0  MEWS Systolic 0  MEWS Pulse 3  MEWS RR 1  MEWS LOC 0  MEWS Score 4  MEWS Score Color Red  Assess: if the MEWS score is Yellow or Red  Were vital signs taken at a resting state? No  Focused Assessment Change from prior assessment (see assessment flowsheet)  Does the patient meet 2 or more of the SIRS criteria? No  Does the patient have a confirmed or suspected source of infection? No  Assess: SIRS CRITERIA  SIRS Temperature  0  SIRS Pulse 1  SIRS Respirations  1  SIRS WBC 0  SIRS Score Sum  2   Respiratory at bedside for breathing treatment.  Family medicine service contacted.  Orders given to give lopressor and atarax early.  Respiratory increased O2 requirements

## 2023-01-15 NOTE — Progress Notes (Signed)
Pt with increasing oxygen demand - 91% @ 10L HFNC. RT notified.  MD notified.

## 2023-01-16 ENCOUNTER — Inpatient Hospital Stay (HOSPITAL_COMMUNITY): Payer: 59

## 2023-01-16 ENCOUNTER — Other Ambulatory Visit (HOSPITAL_COMMUNITY): Payer: Self-pay

## 2023-01-16 ENCOUNTER — Telehealth: Payer: Self-pay | Admitting: Pulmonary Disease

## 2023-01-16 DIAGNOSIS — J9690 Respiratory failure, unspecified, unspecified whether with hypoxia or hypercapnia: Secondary | ICD-10-CM | POA: Diagnosis present

## 2023-01-16 DIAGNOSIS — J9601 Acute respiratory failure with hypoxia: Secondary | ICD-10-CM | POA: Diagnosis not present

## 2023-01-16 DIAGNOSIS — J189 Pneumonia, unspecified organism: Secondary | ICD-10-CM | POA: Diagnosis not present

## 2023-01-16 DIAGNOSIS — I480 Paroxysmal atrial fibrillation: Secondary | ICD-10-CM | POA: Diagnosis not present

## 2023-01-16 LAB — CBC
HCT: 41.8 % (ref 36.0–46.0)
Hemoglobin: 13.3 g/dL (ref 12.0–15.0)
MCH: 27.1 pg (ref 26.0–34.0)
MCHC: 31.8 g/dL (ref 30.0–36.0)
MCV: 85.3 fL (ref 80.0–100.0)
Platelets: 434 K/uL — ABNORMAL HIGH (ref 150–400)
RBC: 4.9 MIL/uL (ref 3.87–5.11)
RDW: 14.5 % (ref 11.5–15.5)
WBC: 19 K/uL — ABNORMAL HIGH (ref 4.0–10.5)
nRBC: 0 % (ref 0.0–0.2)

## 2023-01-16 LAB — POCT I-STAT 7, (LYTES, BLD GAS, ICA,H+H)
Acid-Base Excess: 8 mmol/L — ABNORMAL HIGH (ref 0.0–2.0)
Bicarbonate: 36 mmol/L — ABNORMAL HIGH (ref 20.0–28.0)
Calcium, Ion: 1.21 mmol/L (ref 1.15–1.40)
HCT: 43 % (ref 36.0–46.0)
Hemoglobin: 14.6 g/dL (ref 12.0–15.0)
O2 Saturation: 97 %
Patient temperature: 97.9
Potassium: 3.9 mmol/L (ref 3.5–5.1)
Sodium: 136 mmol/L (ref 135–145)
TCO2: 38 mmol/L — ABNORMAL HIGH (ref 22–32)
pCO2 arterial: 61.8 mmHg — ABNORMAL HIGH (ref 32–48)
pH, Arterial: 7.371 (ref 7.35–7.45)
pO2, Arterial: 91 mmHg (ref 83–108)

## 2023-01-16 LAB — STREP PNEUMONIAE URINARY ANTIGEN: Strep Pneumo Urinary Antigen: NEGATIVE

## 2023-01-16 LAB — BASIC METABOLIC PANEL WITH GFR
Anion gap: 10 (ref 5–15)
BUN: 14 mg/dL (ref 8–23)
CO2: 34 mmol/L — ABNORMAL HIGH (ref 22–32)
Calcium: 9.3 mg/dL (ref 8.9–10.3)
Chloride: 93 mmol/L — ABNORMAL LOW (ref 98–111)
Creatinine, Ser: 0.93 mg/dL (ref 0.44–1.00)
GFR, Estimated: >60 mL/min
Glucose, Bld: 132 mg/dL — ABNORMAL HIGH (ref 70–99)
Potassium: 3.7 mmol/L (ref 3.5–5.1)
Sodium: 137 mmol/L (ref 135–145)

## 2023-01-16 LAB — APTT: aPTT: 89 seconds — ABNORMAL HIGH (ref 24–36)

## 2023-01-16 LAB — CULTURE, BLOOD (SINGLE)

## 2023-01-16 LAB — BLOOD GAS, ARTERIAL
Acid-Base Excess: 9.9 mmol/L — ABNORMAL HIGH (ref 0.0–2.0)
Bicarbonate: 40.8 mmol/L — ABNORMAL HIGH (ref 20.0–28.0)
Drawn by: 42783
O2 Saturation: 86.7 %
Patient temperature: 36.4
pCO2 arterial: 89 mmHg (ref 32–48)
pH, Arterial: 7.27 — ABNORMAL LOW (ref 7.35–7.45)
pO2, Arterial: 54 mmHg — ABNORMAL LOW (ref 83–108)

## 2023-01-16 LAB — GLUCOSE, CAPILLARY
Glucose-Capillary: 123 mg/dL — ABNORMAL HIGH (ref 70–99)
Glucose-Capillary: 131 mg/dL — ABNORMAL HIGH (ref 70–99)
Glucose-Capillary: 125 mg/dL — ABNORMAL HIGH (ref 70–99)
Glucose-Capillary: 169 mg/dL — ABNORMAL HIGH (ref 70–99)

## 2023-01-16 LAB — MRSA NEXT GEN BY PCR, NASAL: MRSA by PCR Next Gen: NOT DETECTED

## 2023-01-16 LAB — MAGNESIUM
Magnesium: 2 mg/dL (ref 1.7–2.4)
Magnesium: 2 mg/dL (ref 1.7–2.4)
Magnesium: 2 mg/dL (ref 1.7–2.4)
Magnesium: 2 mg/dL (ref 1.7–2.4)

## 2023-01-16 LAB — PHOSPHORUS
Phosphorus: 4.2 mg/dL (ref 2.5–4.6)
Phosphorus: 3.4 mg/dL (ref 2.5–4.6)

## 2023-01-16 LAB — PROCALCITONIN: Procalcitonin: <0.1 ng/mL

## 2023-01-16 LAB — HEPARIN LEVEL (UNFRACTIONATED): Heparin Unfractionated: >1.1 [IU]/mL — ABNORMAL HIGH (ref 0.30–0.70)

## 2023-01-16 LAB — LACTIC ACID, PLASMA
Lactic Acid, Venous: 1.4 mmol/L (ref 0.5–1.9)
Lactic Acid, Venous: 2.1 mmol/L (ref 0.5–1.9)

## 2023-01-16 MED ORDER — FENTANYL 2500MCG IN NS 250ML (10MCG/ML) PREMIX INFUSION: 25.0000 ug/h | INTRAVENOUS | Status: DC

## 2023-01-16 MED ORDER — FENTANYL CITRATE PF 50 MCG/ML IJ SOSY: 25.0000 ug | PREFILLED_SYRINGE | INTRAMUSCULAR | Status: DC | PRN

## 2023-01-16 MED ORDER — THIAMINE MONONITRATE 100 MG PO TABS
100.0000 mg | ORAL_TABLET | Freq: Every day | ORAL | Status: AC
  Administered 2023-01-16 – 2023-01-20 (×5): 100 mg
  Filled 2023-01-16 (×5): qty 1

## 2023-01-16 MED ORDER — CITALOPRAM HYDROBROMIDE 10 MG PO TABS
10.0000 mg | ORAL_TABLET | Freq: Every day | ORAL | Status: DC
  Administered 2023-01-17 – 2023-02-11 (×26): 10 mg
  Filled 2023-01-16 (×26): qty 1

## 2023-01-16 MED ORDER — HEPARIN BOLUS VIA INFUSION
2000.0000 [IU] | Freq: Once | INTRAVENOUS | Status: AC
  Administered 2023-01-16: 2000 [IU] via INTRAVENOUS
  Filled 2023-01-16: qty 2000

## 2023-01-16 MED ORDER — METOPROLOL TARTRATE 5 MG/5ML IV SOLN
2.5000 mg | Freq: Four times a day (QID) | INTRAVENOUS | Status: DC | PRN
  Administered 2023-02-09: 2.5 mg via INTRAVENOUS
  Filled 2023-01-16: qty 5

## 2023-01-16 MED ORDER — NOREPINEPHRINE 4 MG/250ML-% IV SOLN
INTRAVENOUS | Status: AC
  Administered 2023-01-16: 4 mg
  Filled 2023-01-16: qty 250

## 2023-01-16 MED ORDER — DEXMEDETOMIDINE HCL IN NACL 400 MCG/100ML IV SOLN
INTRAVENOUS | Status: AC
  Filled 2023-01-16: qty 100

## 2023-01-16 MED ORDER — SUCCINYLCHOLINE CHLORIDE 200 MG/10ML IV SOSY
PREFILLED_SYRINGE | INTRAVENOUS | Status: AC
  Filled 2023-01-16: qty 10

## 2023-01-16 MED ORDER — ETOMIDATE 2 MG/ML IV SOLN
INTRAVENOUS | Status: AC
  Administered 2023-01-16: 20 mg
  Filled 2023-01-16: qty 20

## 2023-01-16 MED ORDER — NOREPINEPHRINE 4 MG/250ML-% IV SOLN
0.0000 ug/min | INTRAVENOUS | Status: DC
  Administered 2023-01-16: 5 ug/min via INTRAVENOUS
  Administered 2023-01-16: 3 ug/min via INTRAVENOUS
  Administered 2023-01-17: 9 ug/min via INTRAVENOUS
  Administered 2023-01-17: 6 ug/min via INTRAVENOUS
  Administered 2023-01-18: 1 ug/min via INTRAVENOUS
  Administered 2023-01-20: 3 ug/min via INTRAVENOUS
  Filled 2023-01-16 (×5): qty 250

## 2023-01-16 MED ORDER — SODIUM CHLORIDE 0.9 % IV SOLN: INTRAVENOUS | Status: DC

## 2023-01-16 MED ORDER — ADULT MULTIVITAMIN W/MINERALS CH
1.0000 | ORAL_TABLET | Freq: Every day | ORAL | Status: DC
  Administered 2023-01-17 – 2023-03-02 (×44): 1
  Filled 2023-01-16 (×44): qty 1

## 2023-01-16 MED ORDER — HEPARIN (PORCINE) 25000 UT/250ML-% IV SOLN
1200.0000 [IU]/h | INTRAVENOUS | Status: DC
  Administered 2023-01-16 – 2023-01-17 (×2): 1200 [IU]/h via INTRAVENOUS
  Filled 2023-01-16 (×2): qty 250

## 2023-01-16 MED ORDER — FENTANYL CITRATE PF 50 MCG/ML IJ SOSY
PREFILLED_SYRINGE | INTRAMUSCULAR | Status: AC
  Filled 2023-01-16: qty 2

## 2023-01-16 MED ORDER — FAMOTIDINE 20 MG PO TABS: 20.0000 mg | ORAL_TABLET | Freq: Two times a day (BID) | ORAL | Status: DC

## 2023-01-16 MED ORDER — ORAL CARE MOUTH RINSE: 15.0000 mL | OROMUCOSAL | Status: DC | PRN

## 2023-01-16 MED ORDER — FAMOTIDINE 20 MG PO TABS
20.0000 mg | ORAL_TABLET | Freq: Two times a day (BID) | ORAL | Status: DC
  Administered 2023-01-16 – 2023-02-24 (×77): 20 mg
  Filled 2023-01-16 (×77): qty 1

## 2023-01-16 MED ORDER — IOHEXOL 350 MG/ML SOLN
75.0000 mL | Freq: Once | INTRAVENOUS | Status: AC | PRN
  Administered 2023-01-16: 75 mL via INTRAVENOUS

## 2023-01-16 MED ORDER — MIDAZOLAM HCL 2 MG/2ML IJ SOLN
INTRAMUSCULAR | Status: AC
  Filled 2023-01-16: qty 2

## 2023-01-16 MED ORDER — DOCUSATE SODIUM 50 MG/5ML PO LIQD
100.0000 mg | Freq: Two times a day (BID) | ORAL | Status: DC
  Administered 2023-01-16 – 2023-01-20 (×6): 100 mg
  Filled 2023-01-16 (×8): qty 10

## 2023-01-16 MED ORDER — DOCUSATE SODIUM 100 MG PO CAPS: 100.0000 mg | ORAL_CAPSULE | Freq: Two times a day (BID) | ORAL | Status: DC | PRN

## 2023-01-16 MED ORDER — CALCIUM CARBONATE ANTACID 500 MG PO CHEW
1.0000 | CHEWABLE_TABLET | Freq: Two times a day (BID) | ORAL | Status: DC
  Administered 2023-01-16 – 2023-02-24 (×77): 200 mg
  Filled 2023-01-16 (×77): qty 1

## 2023-01-16 MED ORDER — FENTANYL BOLUS VIA INFUSION: 25.0000 ug | INTRAVENOUS | Status: DC | PRN

## 2023-01-16 MED ORDER — MIRTAZAPINE 15 MG PO TABS
15.0000 mg | ORAL_TABLET | Freq: Every day | ORAL | Status: DC
  Administered 2023-01-16 – 2023-02-24 (×40): 15 mg
  Filled 2023-01-16 (×41): qty 1

## 2023-01-16 MED ORDER — POLYETHYLENE GLYCOL 3350 17 G PO PACK: 17.0000 g | PACK | Freq: Every day | ORAL | Status: DC | PRN

## 2023-01-16 MED ORDER — POLYETHYLENE GLYCOL 3350 17 G PO PACK
17.0000 g | PACK | Freq: Every day | ORAL | Status: DC
  Administered 2023-01-17: 17 g
  Filled 2023-01-16: qty 1

## 2023-01-16 MED ORDER — KETAMINE HCL 50 MG/5ML IJ SOSY
PREFILLED_SYRINGE | INTRAMUSCULAR | Status: AC
  Filled 2023-01-16: qty 10

## 2023-01-16 MED ORDER — DEXMEDETOMIDINE HCL IN NACL 400 MCG/100ML IV SOLN
0.0000 ug/kg/h | INTRAVENOUS | Status: DC
  Administered 2023-01-16: 0.4 ug/kg/h via INTRAVENOUS
  Administered 2023-01-16 (×3): 1.2 ug/kg/h via INTRAVENOUS
  Administered 2023-01-17: 1 ug/kg/h via INTRAVENOUS
  Administered 2023-01-17: 1.2 ug/kg/h via INTRAVENOUS
  Filled 2023-01-16 (×6): qty 100

## 2023-01-16 MED ORDER — HEPARIN SODIUM (PORCINE) 5000 UNIT/ML IJ SOLN: 5000.0000 [IU] | Freq: Three times a day (TID) | INTRAMUSCULAR | Status: DC

## 2023-01-16 MED ORDER — OSMOLITE 1.5 CAL PO LIQD
1000.0000 mL | ORAL | Status: DC
  Administered 2023-01-16 – 2023-03-01 (×47): 1000 mL
  Filled 2023-01-16 (×61): qty 1000

## 2023-01-16 MED ORDER — FENTANYL CITRATE PF 50 MCG/ML IJ SOSY: 25.0000 ug | PREFILLED_SYRINGE | Freq: Once | INTRAMUSCULAR | Status: DC

## 2023-01-16 MED ORDER — FENTANYL CITRATE PF 50 MCG/ML IJ SOSY
25.0000 ug | PREFILLED_SYRINGE | INTRAMUSCULAR | Status: DC | PRN
  Administered 2023-01-16 – 2023-01-17 (×9): 50 ug via INTRAVENOUS
  Administered 2023-01-17: 100 ug via INTRAVENOUS
  Administered 2023-01-17: 50 ug via INTRAVENOUS
  Administered 2023-01-17: 100 ug via INTRAVENOUS
  Filled 2023-01-16 (×3): qty 1
  Filled 2023-01-16 (×2): qty 2
  Filled 2023-01-16 (×7): qty 1

## 2023-01-16 MED ORDER — CHLORHEXIDINE GLUCONATE CLOTH 2 % EX PADS
6.0000 | MEDICATED_PAD | Freq: Every day | CUTANEOUS | Status: DC
  Administered 2023-01-16 – 2023-03-04 (×47): 6 via TOPICAL

## 2023-01-16 MED ORDER — ATORVASTATIN CALCIUM 40 MG PO TABS
40.0000 mg | ORAL_TABLET | Freq: Every day | ORAL | Status: DC
  Administered 2023-01-16 – 2023-03-01 (×45): 40 mg
  Filled 2023-01-16 (×45): qty 1

## 2023-01-16 MED ORDER — PIPERACILLIN-TAZOBACTAM 3.375 G IVPB
3.3750 g | Freq: Three times a day (TID) | INTRAVENOUS | Status: AC
  Administered 2023-01-16 – 2023-01-24 (×23): 3.375 g via INTRAVENOUS
  Filled 2023-01-16 (×23): qty 50

## 2023-01-16 MED ORDER — LORAZEPAM 2 MG/ML IJ SOLN
0.5000 mg | Freq: Once | INTRAMUSCULAR | Status: AC
  Administered 2023-01-16: 0.5 mg via INTRAVENOUS
  Filled 2023-01-16: qty 1

## 2023-01-16 MED ORDER — PHENYLEPHRINE 80 MCG/ML (10ML) SYRINGE FOR IV PUSH (FOR BLOOD PRESSURE SUPPORT)
PREFILLED_SYRINGE | INTRAVENOUS | Status: AC
  Administered 2023-01-16: 400 ug
  Filled 2023-01-16: qty 10

## 2023-01-16 MED ORDER — ORAL CARE MOUTH RINSE
15.0000 mL | OROMUCOSAL | Status: DC
  Administered 2023-01-17 – 2023-03-05 (×501): 15 mL via OROMUCOSAL

## 2023-01-16 MED ORDER — SODIUM CHLORIDE 0.9 % IV SOLN
2.0000 g | INTRAVENOUS | Status: DC
  Administered 2023-01-16: 2 g via INTRAVENOUS
  Filled 2023-01-16: qty 20

## 2023-01-16 MED ORDER — PROSOURCE TF20 ENFIT COMPATIBL EN LIQD
60.0000 mL | Freq: Every day | ENTERAL | Status: DC
  Administered 2023-01-16 – 2023-03-01 (×43): 60 mL
  Filled 2023-01-16 (×44): qty 60

## 2023-01-16 MED ORDER — ROCURONIUM BROMIDE 10 MG/ML (PF) SYRINGE
PREFILLED_SYRINGE | INTRAVENOUS | Status: AC
  Administered 2023-01-16: 100 mg
  Filled 2023-01-16: qty 10

## 2023-01-16 NOTE — Progress Notes (Signed)
ANTICOAGULATION CONSULT NOTE - Initial Consult  Pharmacy Consult for IV Heparin Indication: atrial fibrillation  Allergies  Allergen Reactions   Codeine Nausea And Vomiting and Other (See Comments)    Hallucinations   Prednisone Other (See Comments)    'Made me feel funny inside my head"-pt cannot be specific    Patient Measurements: Height: 5\' 8"  (172.7 cm) Weight: 79.2 kg (174 lb 9.7 oz) IBW/kg (Calculated) : 63.9 Heparin Dosing Weight: 79.2 kg  Vital Signs: Temp: 97.9 F (36.6 C) (05/06 1136) Temp Source: Oral (05/06 1136) BP: 88/66 (05/06 1121) Pulse Rate: 98 (05/06 1121)  Labs: Recent Labs    01/14/23 0131 01/15/23 1057 01/16/23 0117 01/16/23 1202  HGB  --   --  13.3 14.6  HCT  --   --  41.8 43.0  PLT  --   --  434*  --   CREATININE 0.86 0.84 0.93  --     Estimated Creatinine Clearance: 64.9 mL/min (by C-G formula based on SCr of 0.93 mg/dL).   Medical History: Past Medical History:  Diagnosis Date   Abnormal CT lung screening 07/10/2020   Anxiety    Anxiety state 02/28/2012   GAD7 : 6  PHQ9 : 6   Arthritis    Bladder cancer (HCC) 09/2020   urologist--- dr Berneice Heinrich, incidental finding on PET scan 12/ 2021,  s/p TURBT 09-18-2020 high grade papillary urothelial    Cancer related pain 03/11/2022   CAP (community acquired pneumonia) 04/20/2022   Centrilobular emphysema (HCC)    Chronic left shoulder pain 04/29/2021   Close exposure to COVID-19 virus 05/03/2019   Constipation 04/09/2019   Costochondritis, acute 08/14/2020   DDD (degenerative disc disease), lumbar    Depression    DOE (dyspnea on exertion)    10-29-2020  per pt can do house work without sob, when walks/ stairs stops frequently  (stated recovers quickly when stops)   Emphysema lung (HCC)    Epidermoid cyst 10/29/2018   Recurrent. Located on left hip at iliac crest 2" anterior to midaxillary line. Removed 2013, 2020, 2022.    Epigastric pain 11/16/2020   Fibroid 2003   GERD  (gastroesophageal reflux disease)    History of radiation therapy    Left lung- 10/06/20-10/16/20- Dr. Antony Blackbird   Leg cramping 11/13/2019   Lumbar spine pain 01/09/2015   Menopausal vaginal dryness 11/13/2019   Multiple closed fractures of ribs of left side 08/20/2022   Nocturnal enuresis 06/23/2020   Chronic overnight "leaking" of unknown source. Likely urine, but reports no color or odor. Volume enough for liners or light pads. Occurs every night.    Primary adenocarcinoma of lower lobe of left lung (HCC) 07/2020   oncologist--- dr Arbutus Ped---  dx 11/ 2021 bilateral lower lobe masses,  s/p bronchoscopy w/ bx's 07-28-2020 malignancy cells on left ;  completed SBRT bilateral lower lung 10-16-2020 5 fractions   Radiation-induced dermatitis    on 10-29-2020 pt complaint stomach and chest areas,  completed SBRT 10-16-2020   Seasonal allergies    Stage 3 severe COPD by GOLD classification [facility])    pulmonologist--- dr Tonia Brooms,  using anoro inhaler daily   Wears glasses     Medications:  Scheduled:   arformoterol  15 mcg Nebulization BID   atorvastatin  40 mg Per Tube QHS   budesonide (PULMICORT) nebulizer solution  0.25 mg Nebulization BID   calcium carbonate  1 tablet Per Tube BID   Chlorhexidine Gluconate Cloth  6 each Topical Daily   [  START ON 01/17/2023] citalopram  10 mg Per Tube Daily   docusate  100 mg Per Tube BID   famotidine  20 mg Per Tube BID   hydrocortisone   Rectal BID   lactose free nutrition  237 mL Oral BID BM   mirtazapine  15 mg Per Tube QHS   [START ON 01/17/2023] multivitamin with minerals  1 tablet Per Tube Daily   polyethylene glycol  17 g Per Tube Daily   revefenacin  175 mcg Nebulization Daily   Infusions:   sodium chloride 10 mL/hr at 01/12/23 2033   sodium chloride 50 mL/hr at 01/16/23 1205   dexmedetomidine (PRECEDEX) IV infusion 0.4 mcg/kg/hr (01/16/23 1139)   dexmedetomidine     heparin     norepinephrine     piperacillin-tazobactam (ZOSYN)  IV       Assessment: 67 years of age female transferred to ICU for respiratory distress and intubated. Patient was recently started on Apixaban for new atrial fibrillation which was held 5/4 in anticipation of diagnostic bronchoscopy needed outpatient. Pharmacy consulted to resume anticoagulation with IV heparin on 5/6. H/H is within normal limits. Platelets are up 434. Last dose of apixaban was given on 5/4 at 2125 PM.   Goal of Therapy:  Heparin level 0.3-0.7 units/ml aPTT 66-102 seconds Monitor platelets by anticoagulation protocol: Yes   Plan:  Give 2000 units bolus x 1 Start heparin infusion at 1200 units/hr aPTT and heparin level in 6 hours.  Daily aPTT and heparin level while on therapy.   Link Snuffer, PharmD, BCPS, BCCCP Clinical Pharmacist Please refer to [facility] for [facility] Pharmacy numbers 01/16/2023,12:23 PM

## 2023-01-16 NOTE — Significant Event (Signed)
Rapid Response Event Note   Reason for Call :  Increased WOB  Initial Focused Assessment:  Patient sitting up in bed, tripod position, increased WOB. Inspiratory stridor heard upper airway and bilateral upper breath sounds. Patient nodding appropriately to questions, mouthing questions "how much time do I have?" "Save me." Pt endorses wanting to be full code.    147/119 (129) 115 HR RR 27 O2 98% 100% FiO2 Bipap   Interventions:  CCM to bedside Discussion with patient what next steps could entail: Remain DNR/DNI vs transitioning to full code with intubation.   Plan of Care:  Transfer to ICU, plan to intubate.   Event Summary:  MD Notified: CCM Call Time: 1016 Arrival Time: 1020 End Time: 1115  Truddie Crumble, RN

## 2023-01-16 NOTE — Progress Notes (Signed)
Pharmacy Antibiotic Note  [name] is a 67 y.o. female admitted on 01/11/2023 with pneumonia.  Pharmacy has been consulted for Zosyn dosing. Day 5 of admission and new respiratory distress requiring intubation. WBC trending up.   Plan: Zosyn 3.375g IV q8h (4 hour infusion).  Height: 5\' 8"  (172.7 cm) Weight: 79.2 kg (174 lb 9.7 oz) IBW/kg (Calculated) : 63.9  Temp (24hrs), Avg:98.2 F (36.8 C), Min:97.6 F (36.4 C), Max:98.5 F (36.9 C)  Recent Labs  Lab 01/11/23 1719 01/12/23 0050 01/12/23 1027 01/13/23 0201 01/14/23 0131 01/15/23 1057 01/16/23 0117 01/16/23 1124  WBC 16.8*  --   --   --   --   --  19.0*  --   CREATININE 1.06*  --  0.90 0.81 0.86 0.84 0.93  --   LATICACIDVEN  --  0.8  --   --   --   --   --  1.4    Estimated Creatinine Clearance: 64.9 mL/min (by C-G formula based on SCr of 0.93 mg/dL).    Allergies  Allergen Reactions   Codeine Nausea And Vomiting and Other (See Comments)    Hallucinations   Prednisone Other (See Comments)    'Made me feel funny inside my head"-pt cannot be specific    Antimicrobials this admission: CTX 5/2 >>5/3; cefdinir 5/4>>5/6 Zosyn 5/6 >> Azithro 5/2 >> 5/4  Dose adjustments this admission:   Microbiology results: 5/2 BCx - ngtd x4d   Thank you for allowing pharmacy to be a part of this patient's care.  Link Snuffer, PharmD, BCPS, BCCCP Clinical Pharmacist Please refer to [facility] for [facility] Pharmacy numbers 01/16/2023 1:32 PM

## 2023-01-16 NOTE — Progress Notes (Signed)
Episode noted:  HR 150-160's.  Agitated & trashing.   MD notified. RR notified

## 2023-01-16 NOTE — Progress Notes (Signed)
[name] [id number] Admission Data: 01/16/2023 1:28 PM Attending Provider: Cheri Fowler, MD  WUJ:WJXB, [name], MD Consults/ Treatment Team:   [name] is a 67 y.o. female patient admitted from ED awake, alert  & orientated  X 3,  Full Code, VSS - Blood pressure 117/87, pulse 92, temperature 97.9 F (36.6 C), temperature source Oral, resp. rate (!) 26, height 5\' 8"  (1.727 m), weight 79.2 kg, SpO2 99 %., O2  intubated  Tele 60M 10 # placed and pt is currently running:.   IV site WDL: with a transparent dsg that's clean dry and intact.     Skin, clean-dry- intact without evidence of bruising, or skin tears.   No evidence of skin break down noted on exam.    Will cont to monitor and assist as needed.  [name] Sherlynn Carbon, RN 01/16/2023 1:28 PM

## 2023-01-16 NOTE — IPAL (Signed)
Interdisciplinary Goals of Care Family Meeting   Date carried out:: 01/16/2023  Location of the meeting: Phone conference  Member's involved: Physician, Bedside Registered Nurse, Social Worker, and Family Member or next of kin  Durable Power of Attorney or Environmental health practitioner: [name]    Discussion: We discussed goals of care for [name] .    The Clinical status was relayed to patient's daughter over the phone in detail. I explained that patient is struggling to breathe, her oxygen levels are dropping, she is in severe respiratory distress, she is listed as DNR, either she needs to go on ventilator as noninvasive means of ventilation including BiPAP has been tried and unsuccessful.  Patient and her daughter decided to proceed with endotracheal intubation, when we talked about in case of cardiac arrest if they want to have her resuscitation, patient and her daughter both decided to proceed with resuscitation in case of cardiac arrest.  So patient rescinded her DNR status and would like to be full code    Code status: Full Code  Disposition: Continue current acute care    Family are satisfied with Plan of action and management. All questions answered   Cheri Fowler MD [facility] See Amion for pager If no response to pager, please call [phone] until 7pm After 7pm, Please call E-link [phone]

## 2023-01-16 NOTE — Telephone Encounter (Signed)
Called and spoke with pt's daughter about the bronch which is scheduled tomorrow 5/6 and [name] verbalized understanding. Nothing further needed.

## 2023-01-16 NOTE — Progress Notes (Signed)
Nutrition Follow-up  DOCUMENTATION CODES:   Severe malnutrition in context of acute illness/injury  INTERVENTION:   Initiate tube feeds via OG tube: - Start Osmolite 1.5 @ 25 ml/hr and advance rate by 10 ml q 8 hours to goal rate of 55 ml/hr (1320 ml/day) - PROSource TF20 60 ml daily  Tube feeding regimen at goal rate provides 2060 kcal, 103 grams of protein, and 1006 ml of H2O.  Monitor magnesium, potassium, and phosphorus q 12 hours for at least 6 occurrences, MD to replete as needed, as pt is at risk for refeeding syndrome given severe malnutrition.  - Thiamine 100 mg daily per tube x 5 days due to refeeding risk  - MVI with minerals daily per tube  NUTRITION DIAGNOSIS:   Severe Malnutrition related to acute illness as evidenced by moderate fat depletion, moderate muscle depletion.  Ongoing, being addressed via initiation of tube feeds  GOAL:   Patient will meet greater than or equal to 90% of their needs  Met via tube feeds at goal rate  MONITOR:   Vent status, Labs, Weight trends, TF tolerance  REASON FOR ASSESSMENT:   Ventilator, Consult Enteral/tube feeding initiation and management  ASSESSMENT:   67 y.o. female with PMHx including adenocarcinoma of LLL now with recurrence, COPD, and anxiety presents with acute on chronic hypoxemic respiratory failure likely multifactorial  05/06 - transferred to ICU, intubated  Discussed pt with RN and during ICU rounds. Received consult for enteral nutrition initiation and management. Pt with OG tube in good position per x-ray.  Spoke with pt's daughter at bedside. She confirms that pt had been having issues with appetite and N/V for a few weeks PTA. Pt's daughter attributes decreased appetite and N/V to pt finding out about cancer reoccurrence and having increased anxiety. Pt was eating a few bites here and there during admission. Noted pt completed 25% of 1 meal on 5/03, 25% of 2 meals on 5/04, and 100% of 1 meal on  5/05.  Will start tube feeds at trickle rate and slowly advance to goal. Refeeding labs ordered. No new weights this admission. Daily weights ordered.  Patient is currently intubated on ventilator support Temp (24hrs), Avg:98.2 F (36.8 C), Min:97.6 F (36.4 C), Max:98.5 F (36.9 C)  Drips: Precedex Heparin Levophed: 3 mcg/min NS: 50 ml/hr  Medications reviewed and include: calcium carbonate, colace, pepcid, remeron 15 mg daily, MVI with minerals, miralax, IV abx  Labs reviewed: WBC 19.0 CBG's: 123  Diet Order:   Diet Order             Diet NPO time specified  Diet effective now                   EDUCATION NEEDS:   Education needs have been addressed  Skin:  Skin Assessment: Reviewed RN Assessment  Last BM:  01/15/23  Height:   Ht Readings from Last 1 Encounters:  01/11/23 5\' 8"  (1.727 m)    Weight:   Wt Readings from Last 1 Encounters:  01/11/23 79.2 kg    BMI:  Body mass index is 26.55 kg/m.  Estimated Nutritional Needs:   Kcal:  1900-2100  Protein:  95-120 g  Fluid:  >1.9 L    Mertie Clause, MS, RD, LDN Inpatient Clinical Dietitian Please see AMiON for contact information.

## 2023-01-16 NOTE — Progress Notes (Signed)
Pt noted with episodes of rapid HR.  RR/PCCM paged.  Notified daughter of worsening status and mother's request for her to be at bedside.

## 2023-01-16 NOTE — Progress Notes (Signed)
PCP visit to bedside. Also called daughter [name], she is all up to date. No questions at this time. Provided support.   Fayette Pho, MD

## 2023-01-16 NOTE — Progress Notes (Signed)
Called report and transferred to 3M10 by RR, RT & RN

## 2023-01-16 NOTE — Care Management Important Message (Signed)
Important Message  Patient Details  Name: [name] MRN: [id number] Date of Birth: Jun 10, 1956   Medicare Important Message Given:  Yes     Renie Ora 01/16/2023, 10:53 AM

## 2023-01-16 NOTE — TOC Transition Note (Signed)
Pt was not discharged over the week-end. Discharge meds (#5) are now stored at [facility] pharmacy.

## 2023-01-16 NOTE — Significant Event (Addendum)
Rapid Response Event Note   Reason for Call :  WOB, desaturation  Initial Focused Assessment:  Patient in bed on Bipap upon arrival with mild WOB. Patient briefly opens eyes to voice, able to nod yes/no to questions, minimal effort to speak/mouth answers. Able to respond/follow to commands. *Will note that my assessment was prior to MD entering and pt had more interaction with MD. Skin warm and dry, lungs clear, upper airway stridor present. No edema seen. Patient appears tired.   164/139 [phone]  RR 26 O2 91% 100% FiO2 Bipap  Interventions:  Resume Bipap ABG  Plan of Care:  CCM will re-evaluate today. Continue nebs PRN. SpO2 goal 88-92% per MD.   Event Summary:  MD Notified: Waldron Labs MD Call Time: [phone] Arrival Time: 9604 End Time: 0820  Truddie Crumble, RN

## 2023-01-16 NOTE — Progress Notes (Signed)
Discussed worsening respiratory status with Pulm. Reports that she is on their list and someone will see her today.

## 2023-01-16 NOTE — Consult Note (Signed)
Brief Psychiatry Consult Note  The patient was last seen by the psychiatry service for worsenign anxiety on 5/5. Interim documentation by primary team and nursing staff has been reviewed. At this time, patient is transferring to the ICU for intubation due to worsening respiratory distress and there is no evidence of acute psychiatric disturbance requiring ongoing psychiatric consultation.:  - psych signing off (d/w critical care NP) - please reconsult if there is trouble with agitation from vent weaning, debilitating anxiety after tx of PNA and extubation, or any other questions that might arise.   We will sign off at this time. This has been communicated to the primary team. If issues arise in the future, don't hesitate to reconsult the Psychiatry Inpatient Consult Service.   Gonzalo Waymire A Nicolaos Mitrano

## 2023-01-16 NOTE — Progress Notes (Signed)
RT assisted with transport of this pt from 4E13 to CT and back while on BiPAP. Pt tolerated well with no complications.

## 2023-01-16 NOTE — Progress Notes (Signed)
FMTS Interim Progress Note  S:Notified by nursing that pt was needing Bipap. Went to see pt, RT and nurse at bedside reports that pt had gradually increasing O2 needs overnight and was becoming tired from WOB. Pt had Bipap on, had stridor and working hard to breathe, was able to talk and was grossly oriented.   A/P: AHRF - Giving one dose of Ativan 0.5mg  IV. Hopefully this can help pt adjust to Bipap, and decrease anxiety component of symptoms.   Lincoln Brigham, MD 01/16/2023, 5:36 AM PGY-1, [facility] Family Medicine Service pager [phone]

## 2023-01-16 NOTE — Progress Notes (Signed)
OT Cancellation Note  Patient Details Name: WILLIE MEHAN MRN: [id number] DOB: 1956/03/28   Cancelled Treatment:    Reason Eval/Treat Not Completed: Medical issues which prohibited therapy (pt transferred to ICU level care with plan for intubation. Will follow up for OT tx as appropriate.)  Carver Fila, OTD, OTR/L SecureChat Preferred Acute Rehab [phone]   Dorene Grebe K Koonce 01/16/2023, 12:00 PM

## 2023-01-16 NOTE — Progress Notes (Signed)
eLink Physician-Brief Progress Note Patient Name: EDITHA BONTRAGER DOB: [birth year] MRN: [id number]   Date of Service  01/16/2023  HPI/Events of Note  67 year old female transferred to the ICU with respiratory distress intubated and Started on apixaban for atrial fibrillation.  She was supposed to have a diagnostic bronchoscopy on 5/7.  Anticipate setting of heparin drip at 630 for planned bronchoscopy with EBUS and biopsies.  eICU Interventions  Order placed to reflect current clinical panel in a.m.  Heparin for A-fib can be held as needed.   0013 - Add SSI, goal glucose <180  0201 - Difficulty voiding tonight. Bladder scan showed 485 ml. I & O cath.  Intervention Category Minor Interventions: Clinical assessment - ordering diagnostic tests  Harsimran Westman 01/16/2023, 9:40 PM

## 2023-01-16 NOTE — Progress Notes (Signed)
In to check on patient to find patient leaned off the side of bed trying to reach phone that she dropped. Pt unable to get up on her own due to no energy. Helped patient to lean back in the bed. HR up to 150's, SATs dropped to 78%. Placed patient on 100% FiO2 at this time. Adjusted settings on BiPAP as well due to low tidal volumes. Will continue to monitor.   SATs only up to 86% after 25 minutes of being cut up to 100% FiO2. Patient resting comfortably on BiPAP now.

## 2023-01-16 NOTE — Progress Notes (Signed)
RT assisted with transport of this pt from 4E13 to 3M10 while on BiPAP. No complications.

## 2023-01-16 NOTE — Progress Notes (Signed)
     Daily Progress Note Intern Pager: [phone]  Patient name: [name] Medical record number: [id number] Date of birth: [birth year] Age: 67 y.o. Gender: female  Primary Care Provider: Fayette Pho, MD Consultants: Cardiology, pulmonology, psychiatry Code Status: DNR   Pt Overview and Major Events to Date:  5/2: Admitted to FMTS   Assessment and Plan: [name] is a 67 yo woman admitted for acute on chronic hypoxemic respiratory failure from community-acquired pneumonia and new onset atrial fibrillation.  PMH includes gold stage III COPD recently placed on home O2, recent recurrence of lung adenocarcinoma, suspected vocal cord dysfunction, bladder cancer s/p resection, HLD, HTN.   * Acute hypoxic respiratory failure (HCC) - secondary to CAP Worsening oxygen status overnight and increased WOB requiring BiPap. Rapid response called this AM and stated patient was not oriented. Obtained ABG, pCO2 89 and pO2 54. Upon exam patient resting in NAD, oriented and reported no respiratory distress. Pulmology saw patient this AM and patient opted to become FULL CODE and would like to be intubated. Pulmonology opted for transfer to ICU for intubation. -Further management per CCM  Atrial fibrillation (HCC) Increase in rate to 130s yesterday, given Metoprolol early with improvement. Continues to be in afib -Cardiology following, appreciate recs -Eliquis paused given upcoming biopsy 5/7 -Home Metoprolol 25 mg BID  Anxiety Evaluated by psychiatry over the weekend, Hydroxyzine increased to 50mg  TID and restarted home Gabapentin. Given Ativan overnight. -Remeron 15 mg qhs -Sch Hydroxyzine 25mg   TID -Home citalopram 10 mg daily   FEN/GI: Regular PPx: SCDs Dispo: Home pending respiratory improvement  Subjective:  Patient assessed at bedside, nursing and rapid response nurse present. Patient with BiPap in place, eyes closed. Nursing reported early patient HR elevated and she tried to take  off BiPap but she is now calm. Rapid nurse stated she was not oriented for her. BiPap on since 5AM.  Patient oriented upon exam and reports no significant distress. States she not having worsening anxiety or concern for difficulty breathing. Thinks she could lay still for CT.  Objective: Temp:  [97.6 F (36.4 C)-98.5 F (36.9 C)] 97.9 F (36.6 C) (05/06 1136) Pulse Rate:  [95-154] 98 (05/06 1121) Resp:  [18-38] 18 (05/06 1121) BP: (88-164)/(66-139) 88/66 (05/06 1121) SpO2:  [78 %-97 %] 97 % (05/06 1121) FiO2 (%):  [50 %-100 %] 100 % (05/06 1121) Physical Exam: General: Sleepy but arousable to voice. Difficulty to communicate due to BiPap. NAD Cardiovascular: Tachycardia, irregularly irregular rhythm. No murmur Respiratory: Audible stridor. Airway sounds from BiPap Abdomen: Soft, non-tender, non-distended Extremities: No peripheral edema  Laboratory: Most recent CBC Lab Results  Component Value Date   WBC 19.0 (H) 01/16/2023   HGB 14.6 01/16/2023   HCT 43.0 01/16/2023   MCV 85.3 01/16/2023   PLT 434 (H) 01/16/2023   Most recent BMP    Latest Ref Rng & Units 01/16/2023   12:02 PM  BMP  Sodium 135 - 145 mmol/L 136   Potassium 3.5 - 5.1 mmol/L 3.9     Other pertinent labs: none   Elberta Fortis, MD 01/16/2023, 12:13 PM  PGY-1, [facility] Family Medicine FPTS Intern pager: [phone], text pages welcome Secure chat group [facility] Teaching Service

## 2023-01-16 NOTE — Plan of Care (Signed)

## 2023-01-16 NOTE — Significant Event (Signed)
I was asked to assess [name] due to her increased WOB and increased flow requirement on HHFNC. [name] is alert, oriented x4, anxious speaking in 2 word phrases. Consistent inspiratory stridor when at rest and undisturbed as well as when stimulated. BBS diminished. She also c/o chest tightness. Abdominal accessory muscle use which has increased over the past few hours indicating fatigue. RR 28-30 with sats 92% on HHFNC 40L/55%. She increased her flow requirements from 20L to 40L flow on HHFNC with FiO2 requirement consistently staying around 50-55%. BIPAP was discussed with [name] to help reduce WOB. FMTS notified and Dr. Sherrilee Gilles came to bedside. [name] answered yes when asked if she is breathing a little easier.   0510-98.5 F, HR 102 ST, 142/98, RR 19-24 and sats 96% on BIPAP 15/5, 70% FiO2. Tv 451. Ve 11.4L/min.

## 2023-01-16 NOTE — Procedures (Signed)
Intubation Procedure Note  [name]  [id number]  07/16/1956  Date:01/16/23  Time:3:21 PM   Provider Performing:Tyashia Morrisette    Procedure: Intubation (31500)  Indication(s) Respiratory Failure  Consent Risks of the procedure as well as the alternatives and risks of each were explained to the patient and/or caregiver.  Consent for the procedure was obtained and is signed in the bedside chart   Anesthesia Etomidate and Rocuronium   Time Out Verified patient identification, verified procedure, site/side was marked, verified correct patient position, special equipment/implants available, medications/allergies/relevant history reviewed, required imaging and test results available.   Sterile Technique Usual hand hygeine, masks, and gloves were used   Procedure Description Patient positioned in bed supine.  Sedation given as noted above.  Patient was intubated with endotracheal tube using  DL .  View was Grade 1 full glottis .  Number of attempts was 1.  Colorimetric CO2 detector was consistent with tracheal placement.   Complications/Tolerance None; patient tolerated the procedure well. Chest X-ray is ordered to verify placement.   EBL Minimal   Specimen(s) None

## 2023-01-16 NOTE — H&P (Signed)
NAME:  [name], MRN:  [id number], DOB:  1956-07-20, LOS: 4 ADMISSION DATE:  01/11/2023, CONSULTATION DATE:  01/16/2023 REFERRING MD: Family Practice , CHIEF COMPLAINT:  Acute Respiratory Failure in the setting of Lung Cancer, COPD and infiltrate on CXR   History of Present Illness:  [name] is a 67 y.o.female with a history of hx of COPD, recurrent adenocarcinoma of LLL, ( Biopsy was planned for 5/7) and panic/anxiety that was admitted for AHRF in setting of CAP/ COPD exacerbation  to [facility] Teaching [facility] at [facility].  Pt presented to ED 01/12/2023 for N/V, abm pain, and diarrhea for 2 wks. Abm exam benign, transaminases wnl, and lipase wnl. CTAP and CXR did show developing PNA of RLL. Patient also tachypneic and had increased O2 requirement (Baseline 2L [place], needing up to 8L [place]). Pulmonology was consulted. Patient also had panic attacks/anxiety that exacerbated work of breathing and vocal cord dysfunction, leading to stridor. AHRF thought to be multifactorial including CAP, COPD exacerbation, and vocal cord dysfunction. Patient started on ceftriaxone and azithromycin for CAP coverage. Patient started on DuoNebs and Solu-Medrol for COPD exacerbation. Patient was continued on formulary equivalent of home Breztri. Patient was continued on home Celexa and Atarax, and mirtazapine was added to better control anxiety. Patient completed 3-day course of azithromycin and ceftriaxone was transitioned to cefdinir to finish 5-day total course.  Pt. Was scheduled for Bronch with biopsies 01/17/2023 to further work up her suspected recurrence of lung cancer. Obviously this will be delayed  Pt. Required transfer from floor to Progressive unit  for BiPAP on 5/5 for increased work of breathing. She has decompensated overnight and this am( 01/15/2022)  work of breathing  has increased. Pt is a DNR, and she endorsed to nursing , myself and Dr. Merrily Pew that she was not ready to die and wanted to be intubated . ( See Prior Note  documentation 01/16/2023)  Nursing reached out to CCM 5/6 am to evaluate the patient. Code status has been changed to full code and patient has been transferred to ICU and will be intubated.  Pertinent  Medical History   Past Medical History:  Diagnosis Date   Abnormal CT lung screening 07/10/2020   Anxiety    Anxiety state 02/28/2012   GAD7 : 6  PHQ9 : 6   Arthritis    Bladder cancer (HCC) 09/2020   urologist--- dr Berneice Heinrich, incidental finding on PET scan 12/ 2021,  s/p TURBT 09-18-2020 high grade papillary urothelial    Cancer related pain 03/11/2022   CAP (community acquired pneumonia) 04/20/2022   Centrilobular emphysema (HCC)    Chronic left shoulder pain 04/29/2021   Close exposure to COVID-19 virus 05/03/2019   Constipation 04/09/2019   Costochondritis, acute 08/14/2020   DDD (degenerative disc disease), lumbar    Depression    DOE (dyspnea on exertion)    10-29-2020  per pt can do house work without sob, when walks/ stairs stops frequently  (stated recovers quickly when stops)   Emphysema lung (HCC)    Epidermoid cyst 10/29/2018   Recurrent. Located on left hip at iliac crest 2" anterior to midaxillary line. Removed 2013, 2020, 2022.    Epigastric pain 11/16/2020   Fibroid 2003   GERD (gastroesophageal reflux disease)    History of radiation therapy    Left lung- 10/06/20-10/16/20- Dr. Antony Blackbird   Leg cramping 11/13/2019   Lumbar spine pain 01/09/2015   Menopausal vaginal dryness 11/13/2019   Multiple closed fractures of ribs of  left side 08/20/2022   Nocturnal enuresis 06/23/2020   Chronic overnight "leaking" of unknown source. Likely urine, but reports no color or odor. Volume enough for liners or light pads. Occurs every night.    Primary adenocarcinoma of lower lobe of left lung (HCC) 07/2020   oncologist--- dr Arbutus Ped---  dx 11/ 2021 bilateral lower lobe masses,  s/p bronchoscopy w/ bx's 07-28-2020 malignancy cells on left ;  completed SBRT bilateral lower lung  10-16-2020 5 fractions   Radiation-induced dermatitis    on 10-29-2020 pt complaint stomach and chest areas,  completed SBRT 10-16-2020   Seasonal allergies    Stage 3 severe COPD by GOLD classification [facility])    pulmonologist--- dr Tonia Brooms,  using anoro inhaler daily   Wears glasses      Significant Hospital Events: Including procedures, antibiotic start and stop dates in addition to other pertinent events   01/16/2023 Transferred to ICU for acute Respiratory Failure , intubated   Interim History / Subjective:  Working hard to breath on BiPAP ABG 7.27, 89,54 Transferring to ICU for intubation   Objective   Blood pressure (!) 163/80, pulse (!) 117, temperature 97.6 F (36.4 C), temperature source Oral, resp. rate (!) 31, height 5\' 8"  (1.727 m), weight 79.2 kg, SpO2 97 %.    FiO2 (%):  [50 %-100 %] 100 %  No intake or output data in the 24 hours ending 01/16/23 1104 Filed Weights   01/11/23 1658  Weight: 79.2 kg    Examination: General:  Struggling to breath on BiPAP, accessory muscle use , nasal flaring, severe distress HENT:  NCAT, No JVD, No LAD, mm pink and dry Lungs:  Bilateral chest excursion , + wheeze, Diminished per bases  Cardiovascular:  S1, S2, Tachy per tele at 129 Abdomen:  Soft, slightly distended , BS distant, abdominal muscle usage Extremities:  No obvious deformities. MAE x 4. Brisk cap refill Neuro:  Awake and alert to person and place , alert to situation  GU: Not assessed  Resolved Hospital Problem list     Assessment & Plan:  Acute Respiratory Failure in setting of COPD, infiltrates on CXR and suspected recurrent lung cancer/ stable effusion . ARDS based on PF ratio Plan Pt. Reversed her Code Status to [facility] Transfer to ICU  Intubate Continue ventilator support with lung protective strategies  Wean PEEP and FiO2 for sats greater than 90%. Head of bed elevated 30 degrees. Plateau pressures less than 30 cm H20.  Follow intermittent chest x-ray and ABG.    SAT/SBT as tolerated, mentation preclude extubation  Ensure adequate pulmonary hygiene  Follow cultures  VAP bundle in place  PAD protocol  Start Zosyn today per pharmacy Re-evaluate tomorrow 5/7 based on CXR Continue  nebs as ordered Precedex with PRN fentanyl for RASS of -1   Atrial Fibrillation  Plan Last Epixiban was 5/4 as prep for her bronch which was scheduled for 5/7 Heparin gtt per pharmacy Maintain Mag > 2 and K > 4 Trend Mag and K daily Metoprolol and Diltiazem are held at present Added Lopressor 2.5 mg prn sustained HR > 120   Nutrition  Plan Dietitian referral for TF  Interval progression of disease Recurrent Adenocarcinoma of the LLL Mass in Hilar region  Scheduled for bronch with Biopsies 5/7 as OP per Dr. Tonia Brooms Plan Will discuss with Dr. Tonia Brooms potential for IP procedure  Anxiety Plan Currently on Precedex with prn Fentanyl Rass-1, and calm  Daughter updated 01/16/2023 by Dr. Merrily Pew  Best Practice (right click and "Reselect all SmartList Selections" daily)   Diet/type: tubefeeds DVT prophylaxis: heparin gtt  GI prophylaxis: H2B Lines: N/A Foley:  Yes, and it is still needed Code Status:  full code Last date of multidisciplinary goals of care discussion [01/16/2023 with code status change]  Labs   CBC: Recent Labs  Lab 01/11/23 1719 01/16/23 0117  WBC 16.8* 19.0*  HGB 13.2 13.3  HCT 41.7 41.8  MCV 86.2 85.3  PLT 426* 434*    Basic Metabolic Panel: Recent Labs  Lab 01/12/23 1027 01/13/23 0201 01/14/23 0131 01/15/23 1057 01/16/23 0117  NA 138 138 139 139 137  K 3.8 4.1 3.8 3.5 3.7  CL 103 99 99 95* 93*  CO2 26 27 28 31  34*  GLUCOSE 89 142* 109* 97 132*  BUN 13 9 17 11 14   CREATININE 0.90 0.81 0.86 0.84 0.93  CALCIUM 8.2* 8.8* 8.9 8.9 9.3  MG  --  2.1 2.2  --  2.0   GFR: Estimated Creatinine Clearance: 64.9 mL/min (by C-G formula based on SCr of 0.93 mg/dL). Recent Labs  Lab 01/11/23 1719 01/12/23 0050  01/16/23 0117  WBC 16.8*  --  19.0*  LATICACIDVEN  --  0.8  --     Liver Function Tests: Recent Labs  Lab 01/11/23 1719  AST 15  ALT 23  ALKPHOS 84  BILITOT 1.6*  PROT 7.6  ALBUMIN 3.7   Recent Labs  Lab 01/11/23 1719  LIPASE 27   No results for input(s): "AMMONIA" in the last 168 hours.  ABG    Component Value Date/Time   PHART 7.27 (L) 01/16/2023 0836   PCO2ART 89 (HH) 01/16/2023 0836   PO2ART 54 (L) 01/16/2023 0836   HCO3 40.8 (H) 01/16/2023 0836   TCO2 28 11/04/2020 1006   O2SAT 86.7 01/16/2023 0836     Coagulation Profile: No results for input(s): "INR", "PROTIME" in the last 168 hours.  Cardiac Enzymes: No results for input(s): "CKTOTAL", "CKMB", "CKMBINDEX", "TROPONINI" in the last 168 hours.  HbA1C: Hgb A1c MFr Bld  Date/Time Value Ref Range Status  10/03/2022 03:28 PM 6.0 (H) 4.8 - 5.6 % Final    Comment:             Prediabetes: 5.7 - 6.4          Diabetes: >6.4          Glycemic control for adults with diabetes: <7.0   06/23/2020 12:37 PM 5.9 (H) 4.8 - 5.6 % Final    Comment:             Prediabetes: 5.7 - 6.4          Diabetes: >6.4          Glycemic control for adults with diabetes: <7.0     CBG: No results for input(s): "GLUCAP" in the last 168 hours.  Review of Systems:   Short of breath,  Past Medical History:  She,  has a past medical history of Abnormal CT lung screening (07/10/2020), Anxiety, Anxiety state (02/28/2012), Arthritis, Bladder cancer (HCC) (09/2020), Cancer related pain (03/11/2022), CAP (community acquired pneumonia) (04/20/2022), Centrilobular emphysema (HCC), Chronic left shoulder pain (04/29/2021), Close exposure to COVID-19 virus (05/03/2019), Constipation (04/09/2019), Costochondritis, acute (08/14/2020), DDD (degenerative disc disease), lumbar, Depression, DOE (dyspnea on exertion), Emphysema lung (HCC), Epidermoid cyst (10/29/2018), Epigastric pain (11/16/2020), Fibroid (2003), GERD (gastroesophageal reflux  disease), History of radiation therapy, Leg cramping (11/13/2019), Lumbar spine pain (01/09/2015), Menopausal vaginal dryness (11/13/2019), Multiple closed fractures  of ribs of left side (08/20/2022), Nocturnal enuresis (06/23/2020), Primary adenocarcinoma of lower lobe of left lung (HCC) (07/2020), Radiation-induced dermatitis, Seasonal allergies, Stage 3 severe COPD by GOLD classification (HCC), and Wears glasses.   Surgical History:   Past Surgical History:  Procedure Laterality Date   ANTERIOR CERVICAL DECOMP/DISCECTOMY FUSION  03-08-2003 @ [facility]   C3 --- C6   ARTHROTOMY Right 11/09/2015   Procedure: RIGHT WRIST DISTAL RADIAL ULNAR JOINT ARTHROTOMY AND DEBRIDEMENT,  AND ;  Surgeon: Bradly Bienenstock, MD;  Location: [facility] OR;  Service: Orthopedics;  Laterality: Right;   BRONCHIAL BIOPSY  07/28/2020   Procedure: BRONCHIAL BIOPSIES;  Surgeon: Josephine Igo, DO;  Location: [facility]COPY;  Service: Pulmonary;;   BRONCHIAL BIOPSY  08/17/2021   Procedure: BRONCHIAL BIOPSIES;  Surgeon: Josephine Igo, DO;  Location: [facility];  Service: Pulmonary;;   BRONCHIAL BRUSHINGS  07/28/2020   Procedure: BRONCHIAL BRUSHINGS;  Surgeon: Josephine Igo, DO;  Location: [facility];  Service: Pulmonary;;   BRONCHIAL BRUSHINGS  08/17/2021   Procedure: BRONCHIAL BRUSHINGS;  Surgeon: Josephine Igo, DO;  Location: [facility];  Service: Pulmonary;;   BRONCHIAL NEEDLE ASPIRATION BIOPSY  07/28/2020   Procedure: BRONCHIAL NEEDLE ASPIRATION BIOPSIES;  Surgeon: Josephine Igo, DO;  Location: [facility];  Service: Pulmonary;;   BRONCHIAL WASHINGS  07/28/2020   Procedure: BRONCHIAL WASHINGS;  Surgeon: Josephine Igo, DO;  Location: [facility];  Service: Pulmonary;;   BRONCHIAL WASHINGS  08/17/2021   Procedure: BRONCHIAL WASHINGS;  Surgeon: Josephine Igo, DO;  Location: [facility];  Service: Pulmonary;;   COLONOSCOPY     CYSTOSCOPY WITH RETROGRADE PYELOGRAM, URETEROSCOPY AND STENT PLACEMENT N/A 09/18/2020    Procedure: CYSTOSCOPY WITH RETROGRADE PYELOGRAM bilateral,URETEROSCOPY AND  STENT PLACEMENT right ureter;  Surgeon: Sebastian Ache, MD;  Location: [facility];  Service: Urology;  Laterality: N/A;  1 HR   HARDWARE REMOVAL Right 07/24/2013   Procedure: RIGHT WRIST HARDWARE REMOVAL, JOINT RELEASE, RIGHT HAND MANIPULATION UNDER ANESTHESIA;  Surgeon: Sharma Covert, MD;  Location: [facility];  Service: Orthopedics;  Laterality: Right;   MASS EXCISION Left 10/14/2021   Procedure: EXCISION LEFT BACK CYSTIC MASS;  Surgeon: Sheliah Hatch, De Blanch, MD;  Location: [facility];  Service: General;  Laterality: Left;   OPEN REDUCTION INTERNAL FIXATION (ORIF) DISTAL RADIAL FRACTURE Right 11/08/2012   Procedure: OPEN REDUCTION INTERNAL FIXATION (ORIF) RIGHT DISTAL RADIUS FRACTURE;  Surgeon: Sharma Covert, MD;  Location: [facility];  Service: Orthopedics;  Laterality: Right;   TRANSURETHRAL RESECTION OF BLADDER TUMOR N/A 09/18/2020   Procedure: TRANSURETHRAL RESECTION OF BLADDER TUMOR (TURBT);  Surgeon: Sebastian Ache, MD;  Location: [facility];  Service: Urology;  Laterality: N/A;   TRANSURETHRAL RESECTION OF BLADDER TUMOR N/A 11/04/2020   Procedure: RESTAGING TRANSURETHRAL RESECTION OF BLADDER TUMOR (TURBT); BILATERAL RETROGRADE PYELOGRAM;  Surgeon: Sebastian Ache, MD;  Location: [facility];  Service: Urology;  Laterality: N/A;  1 HR   VIDEO BRONCHOSCOPY WITH ENDOBRONCHIAL NAVIGATION N/A 07/28/2020   Procedure: VIDEO BRONCHOSCOPY WITH ENDOBRONCHIAL NAVIGATION;  Surgeon: Josephine Igo, DO;  Location: [facility];  Service: Pulmonary;  Laterality: N/A;   VIDEO BRONCHOSCOPY WITH RADIAL ENDOBRONCHIAL ULTRASOUND  08/17/2021   Procedure: VIDEO BRONCHOSCOPY WITH RADIAL ENDOBRONCHIAL ULTRASOUND;  Surgeon: Josephine Igo, DO;  Location: [facility];  Service: Pulmonary;;   WRIST ARTHROPLASTY Right 11/09/2015   Procedure: OR DISTAL ULNAR RESECTION ARTHROPLASTY ;   Surgeon: Bradly Bienenstock, MD;  Location: [facility] OR;  Service: Orthopedics;  Laterality: Right;   WRIST ARTHROSCOPY WITH DEBRIDEMENT Right 07/21/2014   Procedure: RIGHT WRIST DISTAL RADIAL ULNAR JOINT DEBRIDEMENT AND JOINT RELEASE POSSIBLE TENDON INTERPOSITION;  Surgeon: Sharma Covert, MD;  Location: [facility] OR;  Service: Orthopedics;  Laterality: Right;     Social History:   reports that she quit smoking about 2 years ago. Her smoking use included cigarettes. She started smoking about 47 years ago. She has a 50.00 pack-year smoking history. She has been exposed to tobacco smoke. She has never used smokeless tobacco. She reports current alcohol use. She reports that she does not use drugs.   Family History:  Her family history includes Dementia in her father; Diabetes in her daughter; Early death (age of onset: 76) in her sister; Hyperlipidemia in her mother; Hypertension in her daughter and mother; Lung cancer in her father; Parkinson's disease in her father; Throat cancer in her brother.   Allergies Allergies  Allergen Reactions   Codeine Nausea And Vomiting and Other (See Comments)    Hallucinations   Prednisone Other (See Comments)    'Made me feel funny inside my head"-pt cannot be specific     Home Medications  Prior to Admission medications   Medication Sig Start Date End Date Taking? Authorizing Provider  albuterol (VENTOLIN HFA) 108 (90 Base) MCG/ACT inhaler USE 2 INHALATIONS BY MOUTH  INTO THE LUNGS EVERY 6  HOURS AS NEEDED FOR  SHORTNESS OF BREATH OR  WHEEZING Patient taking differently: Inhale 2 puffs into the lungs every 6 (six) hours as needed for wheezing or shortness of breath. USE 2 INHALATIONS BY MOUTH  INTO THE LUNGS EVERY 6  HOURS AS NEEDED FOR  SHORTNESS OF BREATH OR  WHEEZING 11/18/22   Fayette Pho, MD  alendronate (FOSAMAX) 70 MG tablet Take 1 tablet (70 mg total) by mouth every 7 (seven) days. Take with a full glass of water on an empty stomach. 12/27/22   Fayette Pho, MD   amLODipine (NORVASC) 10 MG tablet Take 1 tablet (10 mg total) by mouth daily. 10/20/22   Eber Hong, MD  apixaban (ELIQUIS) 5 MG TABS tablet Take 1 tablet (5 mg total) by mouth 2 (two) times daily. 01/13/23   Lincoln Brigham, MD  atorvastatin (LIPITOR) 40 MG tablet TAKE 1 TABLET BY MOUTH AT  BEDTIME 07/04/22   Sabino Dick, DO  baclofen (LIORESAL) 10 MG tablet Take 0.5 tablets (5 mg total) by mouth 3 (three) times daily. 06/08/21   Cresenzo, Cyndi Lennert, MD  Budeson-Glycopyrrol-Formoterol (BREZTRI AEROSPHERE) 160-9-4.8 MCG/ACT AERO Inhale 2 puffs into the lungs in the morning and at bedtime. 11/18/22   Fayette Pho, MD  calcium-vitamin D [place] Rummage WITH D) 500-5 MG-MCG tablet Take 1 tablet by mouth daily with breakfast. 10/03/22   Fayette Pho, MD  cefdinir (OMNICEF) 300 MG capsule Take 1 capsule (300 mg total) by mouth every 12 (twelve) hours. 01/13/23   Lincoln Brigham, MD  Cholecalciferol (VITAMIN D) 50 MCG (2000 UT) tablet Take 1 tablet (2,000 Units total) by mouth daily. 10/03/22   Fayette Pho, MD  citalopram (CELEXA) 10 MG tablet Take 1 tablet (10 mg total) by mouth daily. Follow up in 3-4 weeks for side effect check. 12/06/22   Fayette Pho, MD  diclofenac Sodium (VOLTAREN) 1 % GEL Apply 2 g topically 4 (four) times daily. Apply to painful area of ribs. Need to use regularly for relief. 01/19/21   Fayette Pho, MD  diltiazem (CARDIZEM CD) 120 MG 24 hr capsule Take 1 capsule (120 mg total)  by mouth daily. 01/14/23   Lincoln Brigham, MD  fentaNYL (DURAGESIC) 25 MCG/HR Place 1 patch onto the skin every 3 (three) days. 12/26/22   Fayette Pho, MD  fluticasone [facility]) 50 MCG/ACT nasal spray SHAKE LIQUID AND USE 2 SPRAYS IN [facility] NOSTRIL DAILY 05/02/22   Fayette Pho, MD  gabapentin (NEURONTIN) 600 MG tablet Take 1 tablet (600 mg total) by mouth 3 (three) times daily. 08/19/22   Fayette Pho, MD  hydrOXYzine (ATARAX) 50 MG tablet Take 2 tablets (100 mg total) by mouth at bedtime as needed for  anxiety or itching (sleep). 11/30/22   Fayette Pho, MD  ibuprofen (ADVIL) 800 MG tablet Take 1 tablet (800 mg total) by mouth every 8 (eight) hours as needed. Patient taking differently: Take 800 mg by mouth every 8 (eight) hours as needed for headache or mild pain. 10/14/21   Kinsinger, De Blanch, MD  lidocaine (LIDODERM) 5 % Place 1 patch onto the skin daily. Remove & Discard patch within 12 hours or as directed by MD 08/19/22   Fayette Pho, MD  MELATONIN GUMMIES PO Take 10 mg by mouth at bedtime.    [provider]  metoprolol tartrate (LOPRESSOR) 25 MG tablet Take 1 tablet (25 mg total) by mouth 2 (two) times daily. 01/13/23   Lincoln Brigham, MD  mirtazapine (REMERON) 15 MG tablet Take 1 tablet (15 mg total) by mouth at bedtime. 01/13/23   Lincoln Brigham, MD  naloxone [facility]) nasal spray 4 mg/0.1 mL Use for suspected opioid overdose - too sleepy to wake up, difficulty breathing 05/13/22   Fayette Pho, MD  ondansetron (ZOFRAN) 4 MG tablet Take 1 tablet (4 mg total) by mouth every 8 (eight) hours as needed for nausea or vomiting. Take one tablet before starting each prep. 12/14/22   Jenel Lucks, MD  ondansetron (ZOFRAN-ODT) 4 MG disintegrating tablet Take 1 tablet (4 mg total) by mouth every 8 (eight) hours as needed for nausea or vomiting. 01/02/23   Fayette Pho, MD  pantoprazole (PROTONIX) 40 MG tablet Take 1 tablet (40 mg total) by mouth daily as needed (acid indigestion/heartburn.). 05/27/22   Fayette Pho, MD  polyethylene glycol powder [facility]) 17 GM/SCOOP powder Take 17 g by mouth 2 (two) times daily as needed. Patient taking differently: Take 17 g by mouth 2 (two) times daily as needed for mild constipation. 05/27/22   Fayette Pho, MD  senna (SENOKOT) 8.6 MG TABS tablet Take 1 tablet (8.6 mg total) by mouth at bedtime. 05/27/22   Fayette Pho, MD  triamcinolone cream (KENALOG) 0.1 % Apply 1 Application topically 2 (two) times daily. 12/06/22   Fayette Pho, MD     Critical care time: 75 minutes   Bevelyn Ngo, MSN, AGACNP-BC [facility] Pulmonary/Critical Care Medicine See Amion for personal pager PCCM on call pager [phone]  01/16/2023  11:55 AM

## 2023-01-16 NOTE — Progress Notes (Signed)
Called to bedside by RN. Pt. Remains on BiPAP 100% with IPAP 20 and EPAP 10. ABG >> 7/27/ 89/54 RR is 30-35. And HR is 117. Pt. Is a DNR with Hx of severe COPD previous adenocarcinoma of the lung, SP radiation therapy, she was treated but new mediastinal/ hilar  adenopathy concerning for metastatic disease vs new primary.  Pt is asking to be intubated x RN at bedside x 2 and to myself and again to Dr. Merrily Pew. She wants to  be changed to a  full code. She was awake and all care givers feel she was in her right mind when she made this decision . Her daughter was called and she states she would like [place] to honor her mother's wishes.  She will be transferred to ICU and intubated.   Bevelyn Ngo, MSN, AGACNP-BC [facility] Pulmonary/Critical Care Medicine See Amion for personal pager PCCM on call pager [phone]

## 2023-01-16 NOTE — Telephone Encounter (Signed)
[name] daughter would like to know if patient is having Bronch procedure. Patient is in the hospital. Tonya phone number is [phone].

## 2023-01-16 NOTE — Progress Notes (Signed)
ANTICOAGULATION CONSULT NOTE  Pharmacy Consult for IV Heparin Indication: atrial fibrillation  Allergies  Allergen Reactions   Codeine Nausea And Vomiting and Other (See Comments)    Hallucinations   Prednisone Other (See Comments)    'Made me feel funny inside my head"-pt cannot be specific    Patient Measurements: Height: 5\' 8"  (172.7 cm) Weight: 79.2 kg (174 lb 9.7 oz) IBW/kg (Calculated) : 63.9 Heparin Dosing Weight: 79.2 kg  Vital Signs: Temp: 97.9 F (36.6 C) (05/06 1538) Temp Source: Axillary (05/06 1538) BP: 114/70 (05/06 1830) Pulse Rate: 80 (05/06 2010)  Labs: Recent Labs    01/14/23 0131 01/15/23 1057 01/16/23 0117 01/16/23 1202 01/16/23 2051  HGB  --   --  13.3 14.6  --   HCT  --   --  41.8 43.0  --   PLT  --   --  434*  --   --   APTT  --   --   --   --  89*  HEPARINUNFRC  --   --   --   --  >1.10*  CREATININE 0.86 0.84 0.93  --   --      Estimated Creatinine Clearance: 64.9 mL/min (by C-G formula based on SCr of 0.93 mg/dL).   Assessment: 67 years of age female transferred to ICU for respiratory distress and intubated. Patient was recently started on Apixaban for new atrial fibrillation which was held 5/4 in anticipation of diagnostic bronchoscopy needed outpatient. Pharmacy consulted to resume anticoagulation with IV heparin on 5/6.   aPTT therapeutic and heparin level is falsely elevated due to the effect of Eliquis.  Lab was drawn from the same arm as the heparin infusion; however, distal to the IV site.  No bleeding reported.  Goal of Therapy:  Heparin level 0.3-0.7 units/ml aPTT 66-102 seconds Monitor platelets by anticoagulation protocol: Yes   Plan:  Continue IV heparin at 1200 units/hr F/U AM labs  Ilsa Bonello D. Laney Potash, PharmD, BCPS, BCCCP 01/16/2023, 9:33 PM

## 2023-01-17 ENCOUNTER — Encounter (HOSPITAL_COMMUNITY): Admission: EM | Disposition: E | Payer: Self-pay | Source: Home / Self Care | Attending: Pulmonary Disease

## 2023-01-17 ENCOUNTER — Telehealth: Payer: Self-pay | Admitting: *Deleted

## 2023-01-17 ENCOUNTER — Other Ambulatory Visit: Payer: Self-pay

## 2023-01-17 ENCOUNTER — Inpatient Hospital Stay (HOSPITAL_COMMUNITY): Payer: 59 | Admitting: Registered Nurse

## 2023-01-17 ENCOUNTER — Inpatient Hospital Stay (HOSPITAL_COMMUNITY): Payer: 59

## 2023-01-17 ENCOUNTER — Encounter (HOSPITAL_COMMUNITY): Payer: Self-pay

## 2023-01-17 ENCOUNTER — Inpatient Hospital Stay: Admit: 2023-01-17 | Payer: 59 | Admitting: Internal Medicine

## 2023-01-17 ENCOUNTER — Ambulatory Visit (HOSPITAL_COMMUNITY): Admission: RE | Admit: 2023-01-17 | Payer: 59 | Source: Home / Self Care | Admitting: Pulmonary Disease

## 2023-01-17 DIAGNOSIS — I480 Paroxysmal atrial fibrillation: Secondary | ICD-10-CM | POA: Diagnosis not present

## 2023-01-17 DIAGNOSIS — J9601 Acute respiratory failure with hypoxia: Secondary | ICD-10-CM | POA: Diagnosis not present

## 2023-01-17 DIAGNOSIS — J189 Pneumonia, unspecified organism: Secondary | ICD-10-CM | POA: Diagnosis not present

## 2023-01-17 DIAGNOSIS — R599 Enlarged lymph nodes, unspecified: Secondary | ICD-10-CM | POA: Insufficient documentation

## 2023-01-17 DIAGNOSIS — C342 Malignant neoplasm of middle lobe, bronchus or lung: Secondary | ICD-10-CM | POA: Diagnosis not present

## 2023-01-17 DIAGNOSIS — J449 Chronic obstructive pulmonary disease, unspecified: Secondary | ICD-10-CM | POA: Diagnosis not present

## 2023-01-17 DIAGNOSIS — Z87891 Personal history of nicotine dependence: Secondary | ICD-10-CM | POA: Diagnosis not present

## 2023-01-17 DIAGNOSIS — E43 Unspecified severe protein-calorie malnutrition: Secondary | ICD-10-CM

## 2023-01-17 DIAGNOSIS — F418 Other specified anxiety disorders: Secondary | ICD-10-CM

## 2023-01-17 HISTORY — PX: VIDEO BRONCHOSCOPY: SHX5072

## 2023-01-17 HISTORY — PX: BRONCHIAL NEEDLE ASPIRATION BIOPSY: SHX5106

## 2023-01-17 HISTORY — PX: BRONCHIAL BIOPSY: SHX5109

## 2023-01-17 LAB — POCT I-STAT 7, (LYTES, BLD GAS, ICA,H+H)
Acid-Base Excess: 10 mmol/L — ABNORMAL HIGH (ref 0.0–2.0)
Bicarbonate: 33.7 mmol/L — ABNORMAL HIGH (ref 20.0–28.0)
Calcium, Ion: 1.15 mmol/L (ref 1.15–1.40)
HCT: 35 % — ABNORMAL LOW (ref 36.0–46.0)
Hemoglobin: 11.9 g/dL — ABNORMAL LOW (ref 12.0–15.0)
O2 Saturation: 91 %
Potassium: 3.1 mmol/L — ABNORMAL LOW (ref 3.5–5.1)
Sodium: 132 mmol/L — ABNORMAL LOW (ref 135–145)
TCO2: 35 mmol/L — ABNORMAL HIGH (ref 22–32)
pCO2 arterial: 38.4 mmHg (ref 32–48)
pH, Arterial: 7.55 — ABNORMAL HIGH (ref 7.35–7.45)
pO2, Arterial: 53 mmHg — ABNORMAL LOW (ref 83–108)

## 2023-01-17 LAB — CBC
HCT: 36.9 % (ref 36.0–46.0)
Hemoglobin: 11.8 g/dL — ABNORMAL LOW (ref 12.0–15.0)
MCH: 27 pg (ref 26.0–34.0)
MCHC: 32 g/dL (ref 30.0–36.0)
MCV: 84.4 fL (ref 80.0–100.0)
Platelets: 413 10*3/uL — ABNORMAL HIGH (ref 150–400)
RBC: 4.37 MIL/uL (ref 3.87–5.11)
RDW: 14.4 % (ref 11.5–15.5)
WBC: 22.6 10*3/uL — ABNORMAL HIGH (ref 4.0–10.5)
nRBC: 0 % (ref 0.0–0.2)

## 2023-01-17 LAB — BASIC METABOLIC PANEL
Anion gap: 11 (ref 5–15)
BUN: 26 mg/dL — ABNORMAL HIGH (ref 8–23)
CO2: 29 mmol/L (ref 22–32)
Calcium: 8.4 mg/dL — ABNORMAL LOW (ref 8.9–10.3)
Chloride: 95 mmol/L — ABNORMAL LOW (ref 98–111)
Creatinine, Ser: 1.02 mg/dL — ABNORMAL HIGH (ref 0.44–1.00)
GFR, Estimated: >60 mL/min (ref >60)
Glucose, Bld: 153 mg/dL — ABNORMAL HIGH (ref 70–99)
Potassium: 3.3 mmol/L — ABNORMAL LOW (ref 3.5–5.1)
Sodium: 135 mmol/L (ref 135–145)

## 2023-01-17 LAB — LEGIONELLA PNEUMOPHILA SEROGP 1 UR AG: L. pneumophila Serogp 1 Ur Ag: NEGATIVE

## 2023-01-17 LAB — GLUCOSE, CAPILLARY
Glucose-Capillary: 147 mg/dL — ABNORMAL HIGH (ref 70–99)
Glucose-Capillary: 152 mg/dL — ABNORMAL HIGH (ref 70–99)
Glucose-Capillary: 165 mg/dL — ABNORMAL HIGH (ref 70–99)
Glucose-Capillary: 178 mg/dL — ABNORMAL HIGH (ref 70–99)
Glucose-Capillary: 87 mg/dL (ref 70–99)
Glucose-Capillary: 89 mg/dL (ref 70–99)

## 2023-01-17 LAB — PHOSPHORUS
Phosphorus: 2.2 mg/dL — ABNORMAL LOW (ref 2.5–4.6)
Phosphorus: 2.2 mg/dL — ABNORMAL LOW (ref 2.5–4.6)

## 2023-01-17 LAB — MAGNESIUM
Magnesium: 2.1 mg/dL (ref 1.7–2.4)
Magnesium: 2.2 mg/dL (ref 1.7–2.4)

## 2023-01-17 SURGERY — VIDEO BRONCHOSCOPY WITHOUT FLUORO
Anesthesia: General

## 2023-01-17 MED ORDER — FENTANYL 2500MCG IN NS 250ML (10MCG/ML) PREMIX INFUSION
25.0000 ug/h | INTRAVENOUS | Status: DC
  Administered 2023-01-17: 25 ug/h via INTRAVENOUS
  Administered 2023-01-18 – 2023-01-21 (×4): 100 ug/h via INTRAVENOUS
  Administered 2023-01-22: 175 ug/h via INTRAVENOUS
  Administered 2023-01-22: 150 ug/h via INTRAVENOUS
  Administered 2023-01-23: 100 ug/h via INTRAVENOUS
  Administered 2023-01-24: 95 ug/h via INTRAVENOUS
  Administered 2023-01-24: 175 ug/h via INTRAVENOUS
  Administered 2023-01-25: 145 ug/h via INTRAVENOUS
  Administered 2023-01-26 – 2023-01-28 (×4): 150 ug/h via INTRAVENOUS
  Administered 2023-01-28: 175 ug/h via INTRAVENOUS
  Administered 2023-01-29: 50 ug/h via INTRAVENOUS
  Administered 2023-01-29: 125 ug/h via INTRAVENOUS
  Administered 2023-01-30 – 2023-01-31 (×2): 75 ug/h via INTRAVENOUS
  Administered 2023-02-01: 25 ug/h via INTRAVENOUS
  Filled 2023-01-17 (×22): qty 250

## 2023-01-17 MED ORDER — FENTANYL BOLUS VIA INFUSION
25.0000 ug | INTRAVENOUS | Status: DC | PRN
  Administered 2023-01-17 – 2023-01-18 (×6): 100 ug via INTRAVENOUS
  Administered 2023-01-18: 50 ug via INTRAVENOUS
  Administered 2023-01-18: 100 ug via INTRAVENOUS
  Administered 2023-01-19: 50 ug via INTRAVENOUS
  Administered 2023-01-20 (×5): 100 ug via INTRAVENOUS
  Administered 2023-01-21 (×2): 50 ug via INTRAVENOUS
  Administered 2023-01-21 (×2): 100 ug via INTRAVENOUS
  Administered 2023-01-21: 50 ug via INTRAVENOUS
  Administered 2023-01-21 (×3): 100 ug via INTRAVENOUS
  Administered 2023-01-21 (×2): 50 ug via INTRAVENOUS
  Administered 2023-01-22 – 2023-01-23 (×7): 100 ug via INTRAVENOUS
  Administered 2023-01-23: 50 ug via INTRAVENOUS
  Administered 2023-01-24: 100 ug via INTRAVENOUS
  Administered 2023-01-24 (×2): 95 ug via INTRAVENOUS
  Administered 2023-01-24 (×3): 100 ug via INTRAVENOUS
  Administered 2023-01-24: 95 ug via INTRAVENOUS
  Administered 2023-01-25 – 2023-01-27 (×18): 100 ug via INTRAVENOUS
  Administered 2023-01-28 (×3): 50 ug via INTRAVENOUS
  Administered 2023-01-28: 100 ug via INTRAVENOUS
  Administered 2023-01-28: 50 ug via INTRAVENOUS
  Administered 2023-01-28: 100 ug via INTRAVENOUS
  Administered 2023-01-28: 50 ug via INTRAVENOUS
  Administered 2023-01-28 (×2): 100 ug via INTRAVENOUS
  Administered 2023-01-28 (×2): 50 ug via INTRAVENOUS
  Administered 2023-01-29: 100 ug via INTRAVENOUS
  Administered 2023-01-29 (×4): 50 ug via INTRAVENOUS
  Administered 2023-01-29: 100 ug via INTRAVENOUS
  Administered 2023-01-29: 25 ug via INTRAVENOUS
  Administered 2023-01-29 (×9): 50 ug via INTRAVENOUS
  Administered 2023-01-29 (×2): 100 ug via INTRAVENOUS
  Administered 2023-01-29 – 2023-01-30 (×6): 50 ug via INTRAVENOUS
  Administered 2023-01-30: 25 ug via INTRAVENOUS
  Administered 2023-01-30 (×2): 50 ug via INTRAVENOUS
  Administered 2023-01-31: 100 ug via INTRAVENOUS
  Administered 2023-01-31 (×7): 50 ug via INTRAVENOUS
  Administered 2023-01-31: 75 ug via INTRAVENOUS
  Administered 2023-02-01: 100 ug via INTRAVENOUS
  Administered 2023-02-01: 75 ug via INTRAVENOUS
  Administered 2023-02-01: 50 ug via INTRAVENOUS
  Administered 2023-02-01: 100 ug via INTRAVENOUS
  Administered 2023-02-01 – 2023-02-02 (×3): 75 ug via INTRAVENOUS
  Administered 2023-02-02: 25 ug via INTRAVENOUS
  Administered 2023-02-02 (×2): 75 ug via INTRAVENOUS
  Administered 2023-02-02: 100 ug via INTRAVENOUS
  Administered 2023-02-02: 50 ug via INTRAVENOUS
  Administered 2023-02-02: 75 ug via INTRAVENOUS
  Administered 2023-02-02 (×3): 50 ug via INTRAVENOUS
  Administered 2023-02-03 (×2): 25 ug via INTRAVENOUS

## 2023-01-17 MED ORDER — ROCURONIUM BROMIDE 10 MG/ML (PF) SYRINGE
PREFILLED_SYRINGE | INTRAVENOUS | Status: DC | PRN
  Administered 2023-01-17: 20 mg via INTRAVENOUS

## 2023-01-17 MED ORDER — POTASSIUM CHLORIDE 20 MEQ PO PACK
40.0000 meq | PACK | ORAL | Status: AC
  Administered 2023-01-17 (×2): 40 meq
  Filled 2023-01-17 (×2): qty 2

## 2023-01-17 MED ORDER — FENTANYL CITRATE (PF) 250 MCG/5ML IJ SOLN
INTRAMUSCULAR | Status: DC | PRN
  Administered 2023-01-17: 100 ug via INTRAVENOUS

## 2023-01-17 MED ORDER — PROPOFOL 1000 MG/100ML IV EMUL
0.0000 ug/kg/min | INTRAVENOUS | Status: DC
  Administered 2023-01-17: 50 ug/kg/min via INTRAVENOUS
  Administered 2023-01-17: 5 ug/kg/min via INTRAVENOUS
  Administered 2023-01-18 – 2023-01-19 (×7): 50 ug/kg/min via INTRAVENOUS
  Filled 2023-01-17 (×4): qty 100
  Filled 2023-01-17: qty 200
  Filled 2023-01-17 (×5): qty 100

## 2023-01-17 MED ORDER — FENTANYL CITRATE PF 50 MCG/ML IJ SOSY: 25.0000 ug | PREFILLED_SYRINGE | Freq: Once | INTRAMUSCULAR | Status: DC

## 2023-01-17 MED ORDER — BACLOFEN 10 MG PO TABS
5.0000 mg | ORAL_TABLET | Freq: Three times a day (TID) | ORAL | Status: DC
  Administered 2023-01-17 – 2023-02-06 (×61): 5 mg
  Filled 2023-01-17 (×62): qty 1

## 2023-01-17 MED ORDER — SUGAMMADEX SODIUM 200 MG/2ML IV SOLN
INTRAVENOUS | Status: DC | PRN
  Administered 2023-01-17: 200 mg via INTRAVENOUS

## 2023-01-17 MED ORDER — MIDAZOLAM HCL 2 MG/2ML IJ SOLN
INTRAMUSCULAR | Status: DC | PRN
  Administered 2023-01-17: 2 mg via INTRAVENOUS

## 2023-01-17 MED ORDER — SODIUM CHLORIDE 0.9% FLUSH
10.0000 mL | Freq: Two times a day (BID) | INTRAVENOUS | Status: DC
  Administered 2023-01-17 – 2023-01-18 (×2): 10 mL
  Administered 2023-01-18: 20 mL
  Administered 2023-01-19 (×2): 10 mL
  Administered 2023-01-20: 30 mL
  Administered 2023-01-20 – 2023-01-24 (×8): 10 mL
  Administered 2023-01-24 – 2023-01-26 (×4): 30 mL
  Administered 2023-01-26 – 2023-01-28 (×4): 10 mL
  Administered 2023-01-28: 30 mL
  Administered 2023-01-29 – 2023-01-31 (×6): 10 mL
  Administered 2023-02-01: 30 mL
  Administered 2023-02-01 – 2023-02-02 (×2): 10 mL
  Administered 2023-02-02: 30 mL
  Administered 2023-02-03 – 2023-02-09 (×10): 10 mL
  Administered 2023-02-09: 20 mL
  Administered 2023-02-10 – 2023-02-12 (×6): 10 mL
  Administered 2023-02-13: 20 mL
  Administered 2023-02-13 – 2023-02-17 (×8): 10 mL
  Administered 2023-02-17 – 2023-02-18 (×2): 20 mL
  Administered 2023-02-18 – 2023-02-19 (×2): 10 mL
  Administered 2023-02-19: 20 mL
  Administered 2023-02-20: 30 mL
  Administered 2023-02-20: 10 mL
  Administered 2023-02-21: 30 mL
  Administered 2023-02-21: 10 mL
  Administered 2023-02-22 (×2): 20 mL
  Administered 2023-02-23 (×2): 10 mL
  Administered 2023-02-24: 30 mL
  Administered 2023-02-24 – 2023-02-25 (×2): 10 mL
  Administered 2023-02-25: 30 mL
  Administered 2023-02-26 (×2): 10 mL
  Administered 2023-02-27 (×2): 20 mL
  Administered 2023-02-28: 10 mL
  Administered 2023-02-28: 20 mL
  Administered 2023-03-01 – 2023-03-02 (×3): 10 mL
  Administered 2023-03-02: 20 mL
  Administered 2023-03-03 – 2023-03-05 (×5): 10 mL

## 2023-01-17 MED ORDER — POTASSIUM & SODIUM PHOSPHATES 280-160-250 MG PO PACK
1.0000 | PACK | Freq: Three times a day (TID) | ORAL | Status: AC
  Administered 2023-01-17 (×2): 1
  Filled 2023-01-17 (×4): qty 1

## 2023-01-17 MED ORDER — SODIUM CHLORIDE 0.9% FLUSH: 10.0000 mL | INTRAVENOUS | Status: DC | PRN

## 2023-01-17 MED ORDER — INSULIN ASPART 100 UNIT/ML IJ SOLN
0.0000 [IU] | INTRAMUSCULAR | Status: DC
  Administered 2023-01-17 (×3): 3 [IU] via SUBCUTANEOUS
  Administered 2023-01-17 – 2023-01-19 (×3): 2 [IU] via SUBCUTANEOUS
  Administered 2023-01-19 (×2): 3 [IU] via SUBCUTANEOUS
  Administered 2023-01-20 (×3): 2 [IU] via SUBCUTANEOUS
  Administered 2023-01-20 (×3): 3 [IU] via SUBCUTANEOUS
  Administered 2023-01-21 (×3): 2 [IU] via SUBCUTANEOUS
  Administered 2023-01-21 – 2023-01-22 (×3): 3 [IU] via SUBCUTANEOUS
  Administered 2023-01-22 (×5): 2 [IU] via SUBCUTANEOUS
  Administered 2023-01-22: 5 [IU] via SUBCUTANEOUS
  Administered 2023-01-23 – 2023-01-24 (×6): 2 [IU] via SUBCUTANEOUS
  Administered 2023-01-24 (×3): 3 [IU] via SUBCUTANEOUS
  Administered 2023-01-24: 2 [IU] via SUBCUTANEOUS
  Administered 2023-01-24: 3 [IU] via SUBCUTANEOUS
  Administered 2023-01-25 (×2): 2 [IU] via SUBCUTANEOUS
  Administered 2023-01-26: 3 [IU] via SUBCUTANEOUS
  Administered 2023-01-26: 2 [IU] via SUBCUTANEOUS
  Administered 2023-01-26: 3 [IU] via SUBCUTANEOUS
  Administered 2023-01-27 (×4): 2 [IU] via SUBCUTANEOUS
  Administered 2023-01-27: 3 [IU] via SUBCUTANEOUS
  Administered 2023-01-28 – 2023-01-30 (×7): 2 [IU] via SUBCUTANEOUS

## 2023-01-17 SURGICAL SUPPLY — 29 items
BRUSH CYTOL CELLEBRITY 1.5X140 (MISCELLANEOUS) IMPLANT
CANISTER SUCT 3000ML PPV (MISCELLANEOUS) ×2 IMPLANT
CONT SPEC 4OZ CLIKSEAL STRL BL (MISCELLANEOUS) ×2 IMPLANT
COVER BACK TABLE 60X90IN (DRAPES) ×2 IMPLANT
COVER DOME SNAP 22 D (MISCELLANEOUS) ×2 IMPLANT
FORCEPS BIOP RJ4 1.8 (CUTTING FORCEPS) IMPLANT
GAUZE SPONGE 4X4 12PLY STRL (GAUZE/BANDAGES/DRESSINGS) ×2 IMPLANT
GLOVE BIO SURGEON STRL SZ7.5 (GLOVE) ×2 IMPLANT
GOWN STRL REUS W/ TWL LRG LVL3 (GOWN DISPOSABLE) ×2 IMPLANT
GOWN STRL REUS W/TWL LRG LVL3 (GOWN DISPOSABLE) ×2
KIT CLEAN ENDO COMPLIANCE (KITS) ×4 IMPLANT
KIT TURNOVER KIT B (KITS) ×2 IMPLANT
MARKER SKIN DUAL TIP RULER LAB (MISCELLANEOUS) ×2 IMPLANT
NDL EBUS SONO TIP PENTAX (NEEDLE) ×1 IMPLANT
NEEDLE EBUS SONO TIP PENTAX (NEEDLE) ×2 IMPLANT
NS IRRIG 1000ML POUR BTL (IV SOLUTION) ×2 IMPLANT
OIL SILICONE PENTAX (PARTS (SERVICE/REPAIRS)) ×2 IMPLANT
PAD ARMBOARD 7.5X6 YLW CONV (MISCELLANEOUS) ×4 IMPLANT
SOL ANTI FOG 6CC (MISCELLANEOUS) ×2 IMPLANT
SYR 20CC LL (SYRINGE) ×4 IMPLANT
SYR 20ML ECCENTRIC (SYRINGE) ×4 IMPLANT
SYR 50ML SLIP (SYRINGE) IMPLANT
SYR 5ML LUER SLIP (SYRINGE) ×2 IMPLANT
TOWEL OR 17X24 6PK STRL BLUE (TOWEL DISPOSABLE) ×2 IMPLANT
TRAP SPECIMEN MUCOUS 40CC (MISCELLANEOUS) IMPLANT
TUBE CONNECTING 20X1/4 (TUBING) ×4 IMPLANT
UNDERPAD 30X30 (UNDERPADS AND DIAPERS) ×2 IMPLANT
VALVE DISPOSABLE (MISCELLANEOUS) ×2 IMPLANT
WATER STERILE IRR 1000ML POUR (IV SOLUTION) ×2 IMPLANT

## 2023-01-17 NOTE — Anesthesia Preprocedure Evaluation (Signed)
Anesthesia Evaluation  Patient identified by MRN, date of birth, ID band Patient awake    Reviewed: Allergy & Precautions, NPO status , Patient's Chart, lab work & pertinent test results  Airway Mallampati: Intubated       Dental  (+) Loose,    Pulmonary COPD,  COPD inhaler, former smoker   Pulmonary exam normal breath sounds clear to auscultation       Cardiovascular hypertension, + DOE  Normal cardiovascular exam Rhythm:Regular Rate:Normal  ECG: NSR, rate 72   Neuro/Psych  PSYCHIATRIC DISORDERS Anxiety Depression    negative neurological ROS     GI/Hepatic Neg liver ROS,GERD  Medicated and Controlled,,  Endo/Other  negative endocrine ROS    Renal/GU negative Renal ROS     Musculoskeletal  (+) Arthritis ,    Abdominal   Peds  Hematology negative hematology ROS (+)   Anesthesia Other Findings Acute on chronic respiratory failure Intubated  Reproductive/Obstetrics                             Anesthesia Physical Anesthesia Plan  ASA: 4  Anesthesia Plan: General   Post-op Pain Management: Minimal or no pain anticipated   Induction: Intravenous  PONV Risk Score and Plan: 3 and Ondansetron, Dexamethasone, Midazolam and Treatment may vary due to age or medical condition  Airway Management Planned: Oral ETT  Additional Equipment:   Intra-op Plan:   Post-operative Plan: Post-operative intubation/ventilation  Informed Consent: I have reviewed the patients History and Physical, chart, labs and discussed the procedure including the risks, benefits and alternatives for the proposed anesthesia with the patient or authorized representative who has indicated his/her understanding and acceptance.     Dental advisory given  Plan Discussed with: CRNA  Anesthesia Plan Comments:         Anesthesia Quick Evaluation

## 2023-01-17 NOTE — Progress Notes (Signed)
eLink Physician-Brief Progress Note Patient Name: [name] DOB: 12/06/1955 MRN: [id number]   Date of Service  01/17/2023  HPI/Events of Note  67 year old female transferred to the ICU with respiratory distress intubated and started on apixaban for atrial fibrillation.    Persistent pain related to mass in throat & ETT. Asking for a fent gtt in additon to Prop. She is getting a few doses of PRN fent.    eICU Interventions  CXR in AM  Fentanyl drip as needed      Intervention Category Minor Interventions: Routine modifications to care plan (e.g. PRN medications for pain, fever)  Ramiyah Mcclenahan 01/17/2023, 9:13 PM

## 2023-01-17 NOTE — Progress Notes (Signed)
Sent ordered sputum sample to Lab for analysis. RN aware.

## 2023-01-17 NOTE — Progress Notes (Signed)
[facility]   Blood Pressure (Abnormal) 114/54 (BP Location: Left Arm)   Pulse (Abnormal) 127   Temperature 98.3 F (36.8 C) (Axillary)   Respiration (Abnormal) 25   Height 5\' 8"  (1.727 m)   Weight 78.3 kg   Oxygen Saturation 95%   Body Mass Index 26.25 kg/m    Intake/Output Summary (Last 24 hours) at 01/17/2023 1824 Last data filed at 01/17/2023 1813 Gross per 24 hour  Intake 2636.23 ml  Output 1025 ml  Net 1611.23 ml    Vent Mode: PRVC FiO2 (%):  [50 %-100 %] 70 % Set Rate:  [14 bmp-18 bmp] 14 bmp Vt Set:  [510 mL] 510 mL PEEP:  [8 cmH20-10 cmH20] 10 cmH20 Plateau Pressure:  [15 cmH20-33 cmH20] 33 cmH20   General 67 year old female sedated on vent  HENT orally intubated  Pulm decreased right base Card rrr Abd soft Ext warm and dry  Neuro sedated.   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

## 2023-01-17 NOTE — Progress Notes (Signed)
PCCM:  Based on the fact that the patient has significant extrinsic compression of the airways in the right lower lobe and middle lobe.  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  Josephine Igo, DO [facility] 01/17/2023 3:58 PM

## 2023-01-17 NOTE — Progress Notes (Signed)
PT Cancellation Note  Patient Details Name: KENIDI INSLEY MRN: [id number] DOB: 1956/05/28   Cancelled Treatment:    Reason Eval/Treat Not Completed: Medical issues which prohibited therapy (Pt now intubated.  Not appropriate for therapy per nurse. will return at later date.)   Bevelyn Buckles 01/17/2023, 9:51 AM [facility] M,PT Acute Rehab Services [phone]

## 2023-01-17 NOTE — Anesthesia Procedure Notes (Signed)
Procedure Name: Intubation Date/Time: 01/17/2023 3:20 PM  Performed by: Loleta Veria Stradley, CRNAPre-anesthesia Checklist: Patient identified, Patient being monitored, Timeout performed, Emergency Drugs available and Suction available Patient Re-evaluated:Patient Re-evaluated prior to induction Oxygen Delivery Method: Circle system utilized Preoxygenation: Pre-oxygenation with 100% oxygen Induction Type: Inhalational induction with existing ETT Laryngoscope size: guided over bougie. Grade View: Grade I Tube type: Oral Tube size: 8.5 mm Number of attempts: 1 Airway Equipment and Method: Bougie stylet Placement Confirmation: positive ETCO2 and breath sounds checked- equal and bilateral Secured at: 22 cm Tube secured with: Tape Dental Injury: Teeth and Oropharynx as per pre-operative assessment

## 2023-01-17 NOTE — Transfer of Care (Signed)
Immediate Anesthesia Transfer of Care Note  Patient: [name]  Procedure(s) Performed: VIDEO BRONCHOSCOPY WITHOUT FLUORO BRONCHIAL NEEDLE ASPIRATION BIOPSIES BRONCHIAL BIOPSIES  Patient Location: ICU  Anesthesia Type:General  Level of Consciousness: Patient remains intubated per anesthesia plan  Airway & Oxygen Therapy: ETT   Post-op Assessment: Report given to RN  Post vital signs: stable  Last Vitals:  Vitals Value Taken Time  BP 96/73   Temp 97   Pulse 82   Resp 16   SpO2 96     Last Pain:  Vitals:   01/17/23 1130  TempSrc: Axillary  PainSc:       Patients Stated Pain Goal: 0 (01/14/23 1932)  Complications: No notable events documented.

## 2023-01-17 NOTE — Progress Notes (Signed)
OT Cancellation Note  Patient Details Name: [name] MRN: [id number] DOB: Jun 17, 1956   Cancelled Treatment:    Reason Eval/Treat Not Completed: Medical issues which prohibited therapy (Per RN pt not appropriate for therapy this date, will check back when pt is able to participate.)  Donia Pounds 01/17/2023, 9:47 AM

## 2023-01-17 NOTE — Progress Notes (Signed)
NAME:  [name], MRN:  [id number], DOB:  Nov 05, 1955, LOS: 5 ADMISSION DATE:  01/11/2023, CONSULTATION DATE:  01/16/2023 REFERRING MD: Family Practice , CHIEF COMPLAINT:  Acute Respiratory Failure in the setting of Lung Cancer, COPD and infiltrate on CXR   History of Present Illness:  [name] is a 67 y.o.female with a history of hx of COPD, recurrent adenocarcinoma of LLL, ( Biopsy was planned for 5/7) and panic/anxiety that was admitted for AHRF in setting of CAP/ COPD exacerbation  to [facility] Teaching Service at [facility].   Presented to ED 01/12/2023 for N/V, abm pain, and diarrhea for 2 wks. Abm exam benign, transaminases wnl, and lipase wnl. CTAP and CXR did show developing PNA of RLL. Patient also tachypneic and had increased O2 requirement (Baseline 2L [place], needing up to 8L [facility]). Pulmonology was consulted. Patient also had panic attacks/anxiety that exacerbated work of breathing and vocal cord dysfunction, leading to stridor. AHRF thought to be multifactorial including CAP, COPD exacerbation, and vocal cord dysfunction. Patient started on ceftriaxone and azithromycin for CAP coverage. Patient started on DuoNebs and Solu-Medrol for COPD exacerbation. Patient was continued on formulary equivalent of home Breztri. Patient was continued on home Celexa and Atarax, and mirtazapine was added to better control anxiety. Patient completed 3-day course of azithromycin and ceftriaxone was transitioned to cefdinir to finish 5-day total course.  Pt. Was scheduled for Bronch with biopsies 01/17/2023 to further work up her suspected recurrence of lung cancer. Obviously this will be delayed  Pt. Required transfer from floor to Progressive unit  for BiPAP on 5/5 for increased work of breathing. She has decompensated overnight and this am  (01/15/2022)  work of breathing  has increased. Pt is a DNR, and she endorsed that she was not ready to die and wanted to be intubated. (See Prior Note documentation 01/16/2023)   On  arrival to ICU 5/6 patient intubated.    Pertinent  Medical History   Past Medical History:  Diagnosis Date   Abnormal CT lung screening 07/10/2020   Anxiety    Anxiety state 02/28/2012   GAD7 : 6  PHQ9 : 6   Arthritis    Bladder cancer (HCC) 09/2020   urologist--- dr Berneice Heinrich, incidental finding on PET scan 12/ 2021,  s/p TURBT 09-18-2020 high grade papillary urothelial    Cancer related pain 03/11/2022   CAP (community acquired pneumonia) 04/20/2022   Centrilobular emphysema (HCC)    Chronic left shoulder pain 04/29/2021   Close exposure to COVID-19 virus 05/03/2019   Constipation 04/09/2019   Costochondritis, acute 08/14/2020   DDD (degenerative disc disease), lumbar    Depression    DOE (dyspnea on exertion)    10-29-2020  per pt can do house work without sob, when walks/ stairs stops frequently  (stated recovers quickly when stops)   Emphysema lung (HCC)    Epidermoid cyst 10/29/2018   Recurrent. Located on left hip at iliac crest 2" anterior to midaxillary line. Removed 2013, 2020, 2022.    Epigastric pain 11/16/2020   Fibroid 2003   GERD (gastroesophageal reflux disease)    History of radiation therapy    Left lung- 10/06/20-10/16/20- Dr. Antony Blackbird   Leg cramping 11/13/2019   Lumbar spine pain 01/09/2015   Menopausal vaginal dryness 11/13/2019   Multiple closed fractures of ribs of left side 08/20/2022   Nocturnal enuresis 06/23/2020   Chronic overnight "leaking" of unknown source. Likely urine, but reports no color or odor. Volume enough for liners  or light pads. Occurs every night.    Primary adenocarcinoma of lower lobe of left lung (HCC) 07/2020   oncologist--- dr Arbutus Ped---  dx 11/ 2021 bilateral lower lobe masses,  s/p bronchoscopy w/ bx's 07-28-2020 malignancy cells on left ;  completed SBRT bilateral lower lung 10-16-2020 5 fractions   Radiation-induced dermatitis    on 10-29-2020 pt complaint stomach and chest areas,  completed SBRT 10-16-2020   Seasonal  allergies    Stage 3 severe COPD by GOLD classification [facility])    pulmonologist--- dr Tonia Brooms,  using anoro inhaler daily   Wears glasses      Significant Hospital Events: Including procedures, antibiotic start and stop dates in addition to other pertinent events   01/16/2023 Transferred to ICU for acute Respiratory Failure , intubated   Interim History / Subjective:  This AM on 80%/10 PEEP. Heparin gtt and TF on hold for Bronch  Objective   Blood pressure 101/65, pulse 61, temperature 97.6 F (36.4 C), temperature source Axillary, resp. rate 14, height 5\' 8"  (1.727 m), weight 76.1 kg, SpO2 100 %.    Vent Mode: PRVC FiO2 (%):  [50 %-100 %] 80 % Set Rate:  [14 bmp-18 bmp] 14 bmp Vt Set:  [510 mL] 510 mL PEEP:  [5 cmH20-10 cmH20] 10 cmH20 Plateau Pressure:  [18 cmH20-21 cmH20] 21 cmH20   Intake/Output Summary (Last 24 hours) at 01/17/2023 7829 Last data filed at 01/17/2023 0800 Gross per 24 hour  Intake 2530.28 ml  Output 575 ml  Net 1955.28 ml   Filed Weights   01/11/23 1658 01/17/23 0500  Weight: 79.2 kg 76.1 kg    Examination: General: Critically ill adult female on vent  HENT:  ETT/OG in place  Lungs:  Diminished breath sounds, vent assisted breaths  Cardiovascular:  RRR, HR 65, no mRG Abdomen:  Soft, non-tender, active bowel sounds  Extremities:  -edema  Neuro:  Sedated, Awakens with physical/verbal stimulation, follows commands   GU: Not assessed  Resolved Hospital Problem list     Assessment & Plan:   Acute Respiratory Failure in setting of COPD, infiltrates on CXR and suspected recurrent lung cancer/ stable effusion . ARDS based on PF ratio Plan -Continue ventilator support with lung protective strategies  -Wean PEEP and FiO2 for sats greater than 90%. -Head of bed elevated 30 degrees. -Plateau pressures less than 30 cm H20.  -Follow intermittent chest x-ray and ABG.   -SAT/SBT as tolerated,  -Ensure adequate pulmonary hygiene  -Follow cultures  -VAP bundle  in place  -PAD protocol  -Continue Zosyn Sedation Plan -Precedex with PRN fentanyl for RASS of -1  (Change to Propofol for Planned Bronch today)  -PRN Fentanyl   Hypotension, suspect in setting of sedation  Plan -Cardiac Monitoring -Titrate Levophed for MAP goal >65 (currently on 3)   New Atrial Fibrillation  -Last Epixiban was 5/4 as prep for her bronch which was scheduled for 5/7 Plan -Heparin gtt per pharmacy (on hold for scheduled bronch)  -Maintain Mag > 2 and K > 4 -Trend Mag and K daily -Metoprolol and Diltiazem are held at present  Nutrition  Plan Dietitian referral for TF  Interval progression of disease Recurrent Adenocarcinoma of the LLL Mass in Hilar region  Scheduled for bronch with Biopsies 5/7 as OP per Dr. Tonia Brooms Plan -Bronch plan as above    Best Practice (right click and "Reselect all SmartList Selections" daily)   Diet/type: tubefeeds DVT prophylaxis: heparin gtt  GI prophylaxis: H2B Lines: N/A Foley:  Requiring  intermittent I&O. If continues to needs will place Foley cath  Code Status:  full code Last date of multidisciplinary goals of care discussion [01/16/2023 with code status change]  Labs   CBC: Recent Labs  Lab 01/11/23 1719 01/16/23 0117 01/16/23 1202 01/17/23 0343 01/17/23 0708  WBC 16.8* 19.0*  --   --  22.6*  HGB 13.2 13.3 14.6 11.9* 11.8*  HCT 41.7 41.8 43.0 35.0* 36.9  MCV 86.2 85.3  --   --  84.4  PLT 426* 434*  --   --  413*    Basic Metabolic Panel: Recent Labs  Lab 01/13/23 0201 01/14/23 0131 01/15/23 1057 01/16/23 0117 01/16/23 1124 01/16/23 1202 01/16/23 1449 01/16/23 1741 01/17/23 0343 01/17/23 0708  NA 138 139 139 137  --  136  --   --  132* 135  K 4.1 3.8 3.5 3.7  --  3.9  --   --  3.1* 3.3*  CL 99 99 95* 93*  --   --   --   --   --  95*  CO2 27 28 31  34*  --   --   --   --   --  29  GLUCOSE 142* 109* 97 132*  --   --   --   --   --  153*  BUN 9 17 11 14   --   --   --   --   --  26*  CREATININE 0.81  0.86 0.84 0.93  --   --   --   --   --  1.02*  CALCIUM 8.8* 8.9 8.9 9.3  --   --   --   --   --  8.4*  MG 2.1 2.2  --  2.0 2.0  --  2.0 2.0  --  2.1  PHOS  --   --   --   --   --   --  4.2 3.4  --  2.2*   GFR: Estimated Creatinine Clearance: 54 mL/min (A) (by C-G formula based on SCr of 1.02 mg/dL (H)). Recent Labs  Lab 01/11/23 1719 01/12/23 0050 01/16/23 0117 01/16/23 1124 01/16/23 1449 01/17/23 0708  PROCALCITON  --   --   --  <0.10  --   --   WBC 16.8*  --  19.0*  --   --  22.6*  LATICACIDVEN  --  0.8  --  1.4 2.1*  --     Liver Function Tests: Recent Labs  Lab 01/11/23 1719  AST 15  ALT 23  ALKPHOS 84  BILITOT 1.6*  PROT 7.6  ALBUMIN 3.7   Recent Labs  Lab 01/11/23 1719  LIPASE 27   No results for input(s): "AMMONIA" in the last 168 hours.  ABG    Component Value Date/Time   PHART 7.550 (H) 01/17/2023 0343   PCO2ART 38.4 01/17/2023 0343   PO2ART 53 (L) 01/17/2023 0343   HCO3 33.7 (H) 01/17/2023 0343   TCO2 35 (H) 01/17/2023 0343   O2SAT 91 01/17/2023 0343     Coagulation Profile: No results for input(s): "INR", "PROTIME" in the last 168 hours.  Cardiac Enzymes: No results for input(s): "CKTOTAL", "CKMB", "CKMBINDEX", "TROPONINI" in the last 168 hours.  HbA1C: Hgb A1c MFr Bld  Date/Time Value Ref Range Status  10/03/2022 03:28 PM 6.0 (H) 4.8 - 5.6 % Final    Comment:             Prediabetes: 5.7 - 6.4  Diabetes: >6.4          Glycemic control for adults with diabetes: <7.0   06/23/2020 12:37 PM 5.9 (H) 4.8 - 5.6 % Final    Comment:             Prediabetes: 5.7 - 6.4          Diabetes: >6.4          Glycemic control for adults with diabetes: <7.0     CBG: Recent Labs  Lab 01/16/23 1939 01/16/23 2327 01/17/23 0122 01/17/23 0315 01/17/23 0721  GLUCAP 125* 169* 165* 152* 147*    Review of Systems:   Unable to review given intubated/sedated   Past Medical History:  She,  has a past medical history of Abnormal CT lung  screening (07/10/2020), Anxiety, Anxiety state (02/28/2012), Arthritis, Bladder cancer (HCC) (09/2020), Cancer related pain (03/11/2022), CAP (community acquired pneumonia) (04/20/2022), Centrilobular emphysema (HCC), Chronic left shoulder pain (04/29/2021), Close exposure to COVID-19 virus (05/03/2019), Constipation (04/09/2019), Costochondritis, acute (08/14/2020), DDD (degenerative disc disease), lumbar, Depression, DOE (dyspnea on exertion), Emphysema lung (HCC), Epidermoid cyst (10/29/2018), Epigastric pain (11/16/2020), Fibroid (2003), GERD (gastroesophageal reflux disease), History of radiation therapy, Leg cramping (11/13/2019), Lumbar spine pain (01/09/2015), Menopausal vaginal dryness (11/13/2019), Multiple closed fractures of ribs of left side (08/20/2022), Nocturnal enuresis (06/23/2020), Primary adenocarcinoma of lower lobe of left lung (HCC) (07/2020), Radiation-induced dermatitis, Seasonal allergies, Stage 3 severe COPD by GOLD classification (HCC), and Wears glasses.   Surgical History:   Past Surgical History:  Procedure Laterality Date   ANTERIOR CERVICAL DECOMP/DISCECTOMY FUSION  03-08-2003 @ [facility]   C3 --- C6   ARTHROTOMY Right 11/09/2015   Procedure: RIGHT WRIST DISTAL RADIAL ULNAR JOINT ARTHROTOMY AND DEBRIDEMENT,  AND ;  Surgeon: Bradly Bienenstock, MD;  Location: [facility] OR;  Service: Orthopedics;  Laterality: Right;   BRONCHIAL BIOPSY  07/28/2020   Procedure: BRONCHIAL BIOPSIES;  Surgeon: Josephine Igo, DO;  Location: [facility];  Service: Pulmonary;;   BRONCHIAL BIOPSY  08/17/2021   Procedure: BRONCHIAL BIOPSIES;  Surgeon: Josephine Igo, DO;  Location: [facility];  Service: Pulmonary;;   BRONCHIAL BRUSHINGS  07/28/2020   Procedure: BRONCHIAL BRUSHINGS;  Surgeon: Josephine Igo, DO;  Location: [facility];  Service: Pulmonary;;   BRONCHIAL BRUSHINGS  08/17/2021   Procedure: BRONCHIAL BRUSHINGS;  Surgeon: Josephine Igo, DO;  Location: [facility];  Service: Pulmonary;;    BRONCHIAL NEEDLE ASPIRATION BIOPSY  07/28/2020   Procedure: BRONCHIAL NEEDLE ASPIRATION BIOPSIES;  Surgeon: Josephine Igo, DO;  Location: [facility];  Service: Pulmonary;;   BRONCHIAL WASHINGS  07/28/2020   Procedure: BRONCHIAL WASHINGS;  Surgeon: Josephine Igo, DO;  Location: [facility];  Service: Pulmonary;;   BRONCHIAL WASHINGS  08/17/2021   Procedure: BRONCHIAL WASHINGS;  Surgeon: Josephine Igo, DO;  Location: [facility];  Service: Pulmonary;;   COLONOSCOPY     CYSTOSCOPY WITH RETROGRADE PYELOGRAM, URETEROSCOPY AND STENT PLACEMENT N/A 09/18/2020   Procedure: CYSTOSCOPY WITH RETROGRADE PYELOGRAM bilateral,URETEROSCOPY AND  STENT PLACEMENT right ureter;  Surgeon: Sebastian Ache, MD;  Location: [facility];  Service: Urology;  Laterality: N/A;  1 HR   HARDWARE REMOVAL Right 07/24/2013   Procedure: RIGHT WRIST HARDWARE REMOVAL, JOINT RELEASE, RIGHT HAND MANIPULATION UNDER ANESTHESIA;  Surgeon: Sharma Covert, MD;  Location: [facility];  Service: Orthopedics;  Laterality: Right;   MASS EXCISION Left 10/14/2021   Procedure: EXCISION LEFT BACK CYSTIC MASS;  Surgeon: Kinsinger, De Blanch, MD;  Location: [facility];  Service: General;  Laterality: Left;   OPEN REDUCTION INTERNAL FIXATION (ORIF) DISTAL RADIAL FRACTURE Right 11/08/2012   Procedure: OPEN REDUCTION INTERNAL FIXATION (ORIF) RIGHT DISTAL RADIUS FRACTURE;  Surgeon: Sharma Covert, MD;  Location: [facility];  Service: Orthopedics;  Laterality: Right;   TRANSURETHRAL RESECTION OF BLADDER TUMOR N/A 09/18/2020   Procedure: TRANSURETHRAL RESECTION OF BLADDER TUMOR (TURBT);  Surgeon: Sebastian Ache, MD;  Location: [facility];  Service: Urology;  Laterality: N/A;   TRANSURETHRAL RESECTION OF BLADDER TUMOR N/A 11/04/2020   Procedure: RESTAGING TRANSURETHRAL RESECTION OF BLADDER TUMOR (TURBT); BILATERAL RETROGRADE PYELOGRAM;  Surgeon: Sebastian Ache, MD;  Location: [facility];  Service:  Urology;  Laterality: N/A;  1 HR   VIDEO BRONCHOSCOPY WITH ENDOBRONCHIAL NAVIGATION N/A 07/28/2020   Procedure: VIDEO BRONCHOSCOPY WITH ENDOBRONCHIAL NAVIGATION;  Surgeon: Josephine Igo, DO;  Location: [facility];  Service: Pulmonary;  Laterality: N/A;   VIDEO BRONCHOSCOPY WITH RADIAL ENDOBRONCHIAL ULTRASOUND  08/17/2021   Procedure: VIDEO BRONCHOSCOPY WITH RADIAL ENDOBRONCHIAL ULTRASOUND;  Surgeon: Josephine Igo, DO;  Location: [facility];  Service: Pulmonary;;   WRIST ARTHROPLASTY Right 11/09/2015   Procedure: OR DISTAL ULNAR RESECTION ARTHROPLASTY ;  Surgeon: Bradly Bienenstock, MD;  Location: [facility] OR;  Service: Orthopedics;  Laterality: Right;   WRIST ARTHROSCOPY WITH DEBRIDEMENT Right 07/21/2014   Procedure: RIGHT WRIST DISTAL RADIAL ULNAR JOINT DEBRIDEMENT AND JOINT RELEASE POSSIBLE TENDON INTERPOSITION;  Surgeon: Sharma Covert, MD;  Location: [facility] OR;  Service: Orthopedics;  Laterality: Right;     Social History:   reports that she quit smoking about 2 years ago. Her smoking use included cigarettes. She started smoking about 47 years ago. She has a 50.00 pack-year smoking history. She has been exposed to tobacco smoke. She has never used smokeless tobacco. She reports current alcohol use. She reports that she does not use drugs.   Family History:  Her family history includes Dementia in her father; Diabetes in her daughter; Early death (age of onset: 47) in her sister; Hyperlipidemia in her mother; Hypertension in her daughter and mother; Lung cancer in her father; Parkinson's disease in her father; Throat cancer in her brother.   Allergies Allergies  Allergen Reactions   Codeine Nausea And Vomiting and Other (See Comments)    Hallucinations   Prednisone Other (See Comments)    'Made me feel funny inside my head"-pt cannot be specific     Home Medications  Prior to Admission medications   Medication Sig Start Date End Date Taking? Authorizing Provider  albuterol (VENTOLIN HFA) 108  (90 Base) MCG/ACT inhaler USE 2 INHALATIONS BY MOUTH  INTO THE LUNGS EVERY 6  HOURS AS NEEDED FOR  SHORTNESS OF BREATH OR  WHEEZING Patient taking differently: Inhale 2 puffs into the lungs every 6 (six) hours as needed for wheezing or shortness of breath. USE 2 INHALATIONS BY MOUTH  INTO THE LUNGS EVERY 6  HOURS AS NEEDED FOR  SHORTNESS OF BREATH OR  WHEEZING 11/18/22   Fayette Pho, MD  alendronate (FOSAMAX) 70 MG tablet Take 1 tablet (70 mg total) by mouth every 7 (seven) days. Take with a full glass of water on an empty stomach. 12/27/22   Fayette Pho, MD  amLODipine (NORVASC) 10 MG tablet Take 1 tablet (10 mg total) by mouth daily. 10/20/22   Eber Hong, MD  apixaban (ELIQUIS) 5 MG TABS tablet Take 1 tablet (5 mg total) by mouth 2 (two) times daily. 01/13/23   Lincoln Brigham, MD  atorvastatin (LIPITOR)  40 MG tablet TAKE 1 TABLET BY MOUTH AT  BEDTIME 07/04/22   Sabino Dick, DO  baclofen (LIORESAL) 10 MG tablet Take 0.5 tablets (5 mg total) by mouth 3 (three) times daily. 06/08/21   Cresenzo, Cyndi Lennert, MD  Budeson-Glycopyrrol-Formoterol (BREZTRI AEROSPHERE) 160-9-4.8 MCG/ACT AERO Inhale 2 puffs into the lungs in the morning and at bedtime. 11/18/22   Fayette Pho, MD  calcium-vitamin D [facility] Rummage WITH D) 500-5 MG-MCG tablet Take 1 tablet by mouth daily with breakfast. 10/03/22   Fayette Pho, MD  cefdinir (OMNICEF) 300 MG capsule Take 1 capsule (300 mg total) by mouth every 12 (twelve) hours. 01/13/23   Lincoln Brigham, MD  Cholecalciferol (VITAMIN D) 50 MCG (2000 UT) tablet Take 1 tablet (2,000 Units total) by mouth daily. 10/03/22   Fayette Pho, MD  citalopram (CELEXA) 10 MG tablet Take 1 tablet (10 mg total) by mouth daily. Follow up in 3-4 weeks for side effect check. 12/06/22   Fayette Pho, MD  diclofenac Sodium (VOLTAREN) 1 % GEL Apply 2 g topically 4 (four) times daily. Apply to painful area of ribs. Need to use regularly for relief. 01/19/21   Fayette Pho, MD  diltiazem (CARDIZEM CD)  120 MG 24 hr capsule Take 1 capsule (120 mg total) by mouth daily. 01/14/23   Lincoln Brigham, MD  fentaNYL (DURAGESIC) 25 MCG/HR Place 1 patch onto the skin every 3 (three) days. 12/26/22   Fayette Pho, MD  fluticasone [facility]) 50 MCG/ACT nasal spray SHAKE LIQUID AND USE 2 SPRAYS IN [facility] NOSTRIL DAILY 05/02/22   Fayette Pho, MD  gabapentin (NEURONTIN) 600 MG tablet Take 1 tablet (600 mg total) by mouth 3 (three) times daily. 08/19/22   Fayette Pho, MD  hydrOXYzine (ATARAX) 50 MG tablet Take 2 tablets (100 mg total) by mouth at bedtime as needed for anxiety or itching (sleep). 11/30/22   Fayette Pho, MD  ibuprofen (ADVIL) 800 MG tablet Take 1 tablet (800 mg total) by mouth every 8 (eight) hours as needed. Patient taking differently: Take 800 mg by mouth every 8 (eight) hours as needed for headache or mild pain. 10/14/21   Kinsinger, De Blanch, MD  lidocaine (LIDODERM) 5 % Place 1 patch onto the skin daily. Remove & Discard patch within 12 hours or as directed by MD 08/19/22   Fayette Pho, MD  MELATONIN GUMMIES PO Take 10 mg by mouth at bedtime.    [provider]  metoprolol tartrate (LOPRESSOR) 25 MG tablet Take 1 tablet (25 mg total) by mouth 2 (two) times daily. 01/13/23   Lincoln Brigham, MD  mirtazapine (REMERON) 15 MG tablet Take 1 tablet (15 mg total) by mouth at bedtime. 01/13/23   Lincoln Brigham, MD  naloxone [facility]) nasal spray 4 mg/0.1 mL Use for suspected opioid overdose - too sleepy to wake up, difficulty breathing 05/13/22   Fayette Pho, MD  ondansetron (ZOFRAN) 4 MG tablet Take 1 tablet (4 mg total) by mouth every 8 (eight) hours as needed for nausea or vomiting. Take one tablet before starting each prep. 12/14/22   Jenel Lucks, MD  ondansetron (ZOFRAN-ODT) 4 MG disintegrating tablet Take 1 tablet (4 mg total) by mouth every 8 (eight) hours as needed for nausea or vomiting. 01/02/23   Fayette Pho, MD  pantoprazole (PROTONIX) 40 MG tablet Take 1 tablet (40 mg total)  by mouth daily as needed (acid indigestion/heartburn.). 05/27/22   Fayette Pho, MD  polyethylene glycol powder [facility]) 17 GM/SCOOP powder Take 17 g by mouth 2 (two)  times daily as needed. Patient taking differently: Take 17 g by mouth 2 (two) times daily as needed for mild constipation. 05/27/22   Fayette Pho, MD  senna (SENOKOT) 8.6 MG TABS tablet Take 1 tablet (8.6 mg total) by mouth at bedtime. 05/27/22   Fayette Pho, MD  triamcinolone cream (KENALOG) 0.1 % Apply 1 Application topically 2 (two) times daily. 12/06/22   Fayette Pho, MD     Critical care time: 42 minutes     CRITICAL CARE Performed by: Tobey Grim   Total critical care time: 42 minutes  Critical care time was exclusive of separately billable procedures and treating other patients.  Critical care was necessary to treat or prevent imminent or life-threatening deterioration.  Critical care was time spent personally by me on the following activities: development of treatment plan with patient and/or surrogate as well as nursing, discussions with consultants, evaluation of patient's response to treatment, examination of patient, obtaining history from patient or surrogate, ordering and performing treatments and interventions, ordering and review of laboratory studies, ordering and review of radiographic studies, pulse oximetry and re-evaluation of patient's condition.  Jovita Kussmaul, AGACNP-BC [facility]  PCCM Pgr: [phone]

## 2023-01-17 NOTE — Progress Notes (Signed)
Peripherally Inserted Central Catheter Placement  The IV Nurse has discussed with the patient and/or persons authorized to consent for the patient, the purpose of this procedure and the potential benefits and risks involved with this procedure.  The benefits include less needle sticks, lab draws from the catheter, and the patient may be discharged home with the catheter. Risks include, but not limited to, infection, bleeding, blood clot (thrombus formation), and puncture of an artery; nerve damage and irregular heartbeat and possibility to perform a PICC exchange if needed/ordered by physician.  Alternatives to this procedure were also discussed.  Bard Power PICC patient education guide, fact sheet on infection prevention and patient information card has been provided to patient /or left at bedside. PICC placed by Curt Jews RN.  PICC Placement Documentation  PICC Triple Lumen 01/17/23 Right Brachial 34 cm 0 cm (Active)  Indication for Insertion or Continuance of Line Administration of hyperosmolar/irritating solutions (i.e. TPN, Vancomycin, etc.) 01/17/23 2145  Exposed Catheter (cm) 0 cm 01/17/23 2145  Site Assessment Clean, Dry, Intact 01/17/23 2145  Lumen #1 Status Blood return noted;Flushed;Saline locked 01/17/23 2145  Lumen #2 Status Blood return noted;Flushed;Saline locked 01/17/23 2145  Lumen #3 Status Blood return noted;Flushed;Saline locked 01/17/23 2145  Dressing Type Transparent;Securing device 01/17/23 2145  Dressing Status Clean, Dry, Intact;Antimicrobial disc in place 01/17/23 2145  Safety Lock Not Applicable 01/17/23 2145  Line Care Connections checked and tightened 01/17/23 2145  Line Adjustment (NICU/IV Team Only) No 01/17/23 2145  Dressing Intervention New dressing 01/17/23 2145  Dressing Change Due 01/24/23 01/17/23 2145       Christeen Douglas 01/17/2023, 9:48 PM

## 2023-01-17 NOTE — Telephone Encounter (Signed)
Dr. Tomasa Rand,  This pt is scheduled with you for an colonoscopy on May 15.  She has severe COPD and her procedure will need to be done at the hospital.  Thanks,  Cathlyn Parsons

## 2023-01-17 NOTE — Progress Notes (Signed)
  Transition of Care [facility]) Screening Note   Patient Details  Name: [name] Date of Birth: 12-Feb-1956   Transition of Care [facility]) CM/SW Contact:    Tom-Johnson, Hershal Coria, RN Phone Number: 01/17/2023, 12:29 PM  Patient transferred from 4E to ICU for Acute Respiratory Failure 2/2 CAP. Patient has a scheduled Bronch with biopsies today 01/17/2023 to workup suspected recurrence of Lung Cancer. Patient is currently intubated.  Transition of Care Department [facility]) has reviewed patient and no TOC needs or recommendations have been identified at this time. TOC will continue to monitor patient advancement through interdisciplinary progression rounds. If new patient transition needs arise, please place a TOC consult.

## 2023-01-17 NOTE — Op Note (Signed)
Video Bronchoscopy with Endobronchial Ultrasound Procedure Note  Date of Operation: 01/17/2023  Pre-op Diagnosis: Right hilar mass, RLL obstruction   Post-op Diagnosis: Right hilar mass, RLL obstruction   Surgeon: Josephine Igo, DO   Assistants: None   Anesthesia: General endotracheal anesthesia  Operation: Flexible video fiberoptic bronchoscopy with endobronchial ultrasound and biopsies.  Estimated Blood Loss: Minimal  Complications: None   Indications and History: [name] is a 67 y.o. female with right lung mass.  The risks, benefits, complications, treatment options and expected outcomes were discussed with the patient.  The possibilities of pneumothorax, pneumonia, reaction to medication, pulmonary aspiration, perforation of a viscus, bleeding, failure to diagnose a condition and creating a complication requiring transfusion or operation were discussed with the patient who freely signed the consent.    Description of Procedure: The patient was examined in the preoperative area and history and data from the preprocedure consultation were reviewed. It was deemed appropriate to proceed.  The patient was taken to [facility] endoscopy room 3, identified as Loel Ro Dozal and the procedure verified as Flexible Video Fiberoptic Bronchoscopy.  A Time Out was held and the above information confirmed. After being taken to the operating room general anesthesia was initiated and the patient  was orally intubated. The video fiberoptic bronchoscope was introduced via the endotracheal tube and a general inspection was performed which showed thick mucus plugging from the right mainstem distal.  This was retrieved with en bloc suction and retraction for removal from the airway.  There was abnormal mucosa along the medial wall.  As well as into the distal right lower lobe.  Using the forceps were able to prod into that space to see if there was any opening of airway distally.  This was unsuccessful  to identify an airway.  There was more extrinsic compression of tumor that is occluding the right middle lobe and portions of the right lower lobe.. The standard scope was then withdrawn and the endobronchial ultrasound was used to identify and characterize the peritracheal, hilar and bronchial lymph nodes. Inspection showed enlarged mass in the right hilum and lower lobe. Using real-time ultrasound guidance Wang needle biopsies were take from right hilar mass and were sent for cytology.  Following the endobronchial ultrasound component of the procedure we used standard therapeutic bronchoscope to complete endobronchial forcep biopsies.  Using [employer] 2.4 mm forceps we obtained biopsies from the abnormal mucosa covering the right middle lobe opening and right lower lobe distal segments.  The patient tolerated the procedure well without apparent complications. There was no significant blood loss. The bronchoscope was withdrawn. Anesthesia was reversed and the patient was taken to the PACU for recovery.   Samples: 1.  Right hilar mass  Plans:  The patient will be discharged from the PACU to home when recovered from anesthesia. We will review the cytology, pathology results with the patient when they become available. Outpatient followup will be with Josephine Igo, DO.    Josephine Igo, DO [facility] 01/17/2023 3:53 PM

## 2023-01-18 ENCOUNTER — Ambulatory Visit
Admit: 2023-01-18 | Discharge: 2023-01-18 | Disposition: A | Discharge status: Home / Self Care | Payer: 59 | Attending: Radiation Oncology | Admitting: Radiation Oncology

## 2023-01-18 ENCOUNTER — Ambulatory Visit: Payer: 59

## 2023-01-18 ENCOUNTER — Ambulatory Visit
Admission: RE | Admit: 2023-01-18 | Discharge: 2023-01-18 | Disposition: A | Discharge status: Home / Self Care | Payer: 59 | Source: Ambulatory Visit | Attending: Radiation Oncology | Admitting: Radiation Oncology

## 2023-01-18 ENCOUNTER — Encounter: Payer: Self-pay | Admitting: Gastroenterology

## 2023-01-18 ENCOUNTER — Ambulatory Visit: Payer: 59 | Admitting: Acute Care

## 2023-01-18 ENCOUNTER — Ambulatory Visit: Payer: Self-pay | Admitting: Student

## 2023-01-18 ENCOUNTER — Other Ambulatory Visit: Payer: Self-pay

## 2023-01-18 DIAGNOSIS — J988 Other specified respiratory disorders: Secondary | ICD-10-CM | POA: Insufficient documentation

## 2023-01-18 DIAGNOSIS — C3431 Malignant neoplasm of lower lobe, right bronchus or lung: Secondary | ICD-10-CM | POA: Insufficient documentation

## 2023-01-18 DIAGNOSIS — C3432 Malignant neoplasm of lower lobe, left bronchus or lung: Secondary | ICD-10-CM | POA: Diagnosis not present

## 2023-01-18 DIAGNOSIS — R918 Other nonspecific abnormal finding of lung field: Secondary | ICD-10-CM | POA: Insufficient documentation

## 2023-01-18 DIAGNOSIS — J9601 Acute respiratory failure with hypoxia: Secondary | ICD-10-CM | POA: Diagnosis not present

## 2023-01-18 DIAGNOSIS — Z87891 Personal history of nicotine dependence: Secondary | ICD-10-CM | POA: Diagnosis not present

## 2023-01-18 LAB — CBC
HCT: 34.8 % — ABNORMAL LOW (ref 36.0–46.0)
Hemoglobin: 11.2 g/dL — ABNORMAL LOW (ref 12.0–15.0)
MCH: 28 pg (ref 26.0–34.0)
MCHC: 32.2 g/dL (ref 30.0–36.0)
MCV: 87 fL (ref 80.0–100.0)
Platelets: 376 10*3/uL (ref 150–400)
RBC: 4 MIL/uL (ref 3.87–5.11)
RDW: 14.8 % (ref 11.5–15.5)
WBC: 19.8 10*3/uL — ABNORMAL HIGH (ref 4.0–10.5)
nRBC: 0 % (ref 0.0–0.2)

## 2023-01-18 LAB — BASIC METABOLIC PANEL
Anion gap: 9 (ref 5–15)
BUN: 28 mg/dL — ABNORMAL HIGH (ref 8–23)
CO2: 30 mmol/L (ref 22–32)
Calcium: 8.1 mg/dL — ABNORMAL LOW (ref 8.9–10.3)
Chloride: 97 mmol/L — ABNORMAL LOW (ref 98–111)
Creatinine, Ser: 1.05 mg/dL — ABNORMAL HIGH (ref 0.44–1.00)
GFR, Estimated: 58 mL/min — ABNORMAL LOW (ref >60)
Glucose, Bld: 192 mg/dL — ABNORMAL HIGH (ref 70–99)
Potassium: 3.7 mmol/L (ref 3.5–5.1)
Sodium: 136 mmol/L (ref 135–145)

## 2023-01-18 LAB — RAD ONC ARIA SESSION SUMMARY
Course Elapsed Days: 0
Plan Fractions Treated to Date: 1
Plan Prescribed Dose Per Fraction: 3 Gy
Plan Total Fractions Prescribed: 3
Plan Total Prescribed Dose: 9 Gy
Reference Point Dosage Given to Date: 3 Gy
Reference Point Session Dosage Given: 3 Gy
Session Number: 1

## 2023-01-18 LAB — APTT: aPTT: 69 seconds — ABNORMAL HIGH (ref 24–36)

## 2023-01-18 LAB — TRIGLYCERIDES: Triglycerides: 666 mg/dL — ABNORMAL HIGH (ref <150)

## 2023-01-18 LAB — GLUCOSE, CAPILLARY
Glucose-Capillary: 113 mg/dL — ABNORMAL HIGH (ref 70–99)
Glucose-Capillary: 116 mg/dL — ABNORMAL HIGH (ref 70–99)
Glucose-Capillary: 84 mg/dL (ref 70–99)
Glucose-Capillary: 120 mg/dL — ABNORMAL HIGH (ref 70–99)
Glucose-Capillary: 116 mg/dL — ABNORMAL HIGH (ref 70–99)
Glucose-Capillary: 124 mg/dL — ABNORMAL HIGH (ref 70–99)

## 2023-01-18 LAB — PHOSPHORUS
Phosphorus: 3.4 mg/dL (ref 2.5–4.6)
Phosphorus: 4.1 mg/dL (ref 2.5–4.6)

## 2023-01-18 LAB — CYTOLOGY - NON PAP

## 2023-01-18 LAB — HEPARIN LEVEL (UNFRACTIONATED): Heparin Unfractionated: 0.49 IU/mL (ref 0.30–0.70)

## 2023-01-18 LAB — MAGNESIUM
Magnesium: 2.1 mg/dL (ref 1.7–2.4)
Magnesium: 2.4 mg/dL (ref 1.7–2.4)

## 2023-01-18 MED ORDER — ACETAMINOPHEN 650 MG RE SUPP: 650.0000 mg | Freq: Four times a day (QID) | RECTAL | Status: DC | PRN

## 2023-01-18 MED ORDER — ACETAMINOPHEN 325 MG PO TABS
650.0000 mg | ORAL_TABLET | Freq: Four times a day (QID) | ORAL | Status: DC | PRN
  Administered 2023-01-20 – 2023-01-28 (×3): 650 mg
  Filled 2023-01-18 (×5): qty 2

## 2023-01-18 MED ORDER — HEPARIN (PORCINE) 25000 UT/250ML-% IV SOLN
1050.0000 [IU]/h | INTRAVENOUS | Status: DC
  Administered 2023-01-18: 1200 [IU]/h via INTRAVENOUS
  Administered 2023-01-19: 1100 [IU]/h via INTRAVENOUS
  Administered 2023-01-20: 950 [IU]/h via INTRAVENOUS
  Administered 2023-01-21: 1050 [IU]/h via INTRAVENOUS
  Filled 2023-01-18 (×4): qty 250

## 2023-01-18 MED ORDER — MIDAZOLAM HCL 2 MG/2ML IJ SOLN
INTRAMUSCULAR | Status: AC
  Filled 2023-01-18: qty 2

## 2023-01-18 MED ORDER — MIDAZOLAM HCL 2 MG/2ML IJ SOLN
1.0000 mg | INTRAMUSCULAR | Status: DC | PRN
  Administered 2023-01-18 – 2023-01-29 (×29): 2 mg via INTRAVENOUS
  Administered 2023-01-30: 1 mg via INTRAVENOUS
  Administered 2023-01-31 – 2023-02-09 (×24): 2 mg via INTRAVENOUS
  Administered 2023-02-09: 1 mg via INTRAVENOUS
  Administered 2023-02-10 – 2023-02-20 (×31): 2 mg via INTRAVENOUS
  Administered 2023-02-20: 1 mg via INTRAVENOUS
  Administered 2023-02-20 (×2): 2 mg via INTRAVENOUS
  Administered 2023-02-20: 1 mg via INTRAVENOUS
  Administered 2023-02-21: 2 mg via INTRAVENOUS
  Administered 2023-02-21: 1 mg via INTRAVENOUS
  Administered 2023-02-21 – 2023-02-26 (×19): 2 mg via INTRAVENOUS
  Filled 2023-01-18 (×124): qty 2

## 2023-01-18 NOTE — Progress Notes (Signed)
Radiation Oncology         [phone] ________________________________  Initial inpatient Consultation  Name: [name] MRN: [id number]  Date of Service: 01/18/2023 DOB: 04-12-1956  WU:JWJX, [name], MD  No ref. provider found   REFERRING PHYSICIAN: Dr. Tonia Brooms  DIAGNOSIS: 67 y.o. with right lower lung lung cancer causing airway obstruction on a ventilator due to airway compromise  R91.9, J98.8   ICD-10-CM   1. Acute pancreatitis, unspecified complication status, unspecified pancreatitis type  K85.90     2. Pneumonia of right lower lobe due to infectious organism  J18.9     3. Acute respiratory failure with hypoxia (HCC)  J96.01 For home use only DME oxygen    4. Adenopathy  R59.9 Cytology - Non PAP;    Cytology - Non PAP;    Surgical pathology    Surgical pathology    5. Mass of upper lobe of right lung  R91.8     6. Airway obstruction  J98.8       HISTORY OF PRESENT ILLNESS: [name] is a 67 y.o. female with a history of anxiety, smoking, COPD.  She had left lower lung adenocarcinoma s/p SBRT seen at the request of Dr. Tonia Brooms for a new RLL obstructing lung mass. She was previously treated by Dr. Roselind Messier in 2022 with SBRT for LLL NSCLC. Since then, she has continued follow up with both Dr. Tonia Brooms and Dr. Roselind Messier.   She most recently saw Dr. Tonia Brooms on 01/09/23 for follow up after PET imaging. PET on 12/16/22 showed interval progression of disease with a new tracer avid mass in the right hilar region and an adjacent FDG avid right hilar lymph node. Patient was started on 2L of O2 at that time for hypoxia and scheduled for bronchoscopy and biopsy 01/17/23.  She presented to the ED on 01/11/23 for a 2 week history of abdominal pain and nausea. She stated this started when she was told her lung cancer had returned. CT A/P showed findings that could be secondary to pancreatitis and duodenitis. Also seen was: an enlarging right hilar and infrahilar mass; heterogeneous  consolidations and ground-glass disease in the RLL; a large bulla in the RLL with fluid suggesting a superimposed infection; and mild right hydronephrosis. Patient was admitted for further management.   She was initially started on antibiotics for CAP coverage along with DuoNebs and Solu-Medrol for COPD exacerbation. On 01/15/23 patient was transferred to the progressive care unit for BiPAP due to increased work of breathing. Patient decompensated overnight and was transferred to the ICU on 01/16/23 to be intubated and placed on a ventilator. CT angiogram on 01/16/23 showed interval increase of the right hilar mass with mediastinal invasion. The mass was visualized to be encasing and causing narrowing to the right middle lobe and right lower lobar pulmonary arteries and their proximal segmental branches. The tumor completely occluded the right lower lobe bronchus with new marked postobstructive consolidation and volume loss within the right lower lobe. On 01/17/23 patient underwent bronchoscopy and biopsy under the care of Dr. Tonia Brooms. Biopsy results are still pending at this time.  Patient remains on the ventilator at this time.   PREVIOUS RADIATION THERAPY: Yes   1/25-2/24/22 SBRT to the left lung under the care of Dr. Roselind Messier       PAST MEDICAL HISTORY:  Past Medical History:  Diagnosis Date   Abnormal CT lung screening 07/10/2020   Anxiety    Anxiety state 02/28/2012   GAD7 : 6  PHQ9 : 6   Arthritis    Bladder cancer (HCC) 09/2020   urologist--- dr Berneice Heinrich, incidental finding on PET scan 12/ 2021,  s/p TURBT 09-18-2020 high grade papillary urothelial    Cancer related pain 03/11/2022   CAP (community acquired pneumonia) 04/20/2022   Centrilobular emphysema (HCC)    Chronic left shoulder pain 04/29/2021   Close exposure to COVID-19 virus 05/03/2019   Constipation 04/09/2019   Costochondritis, acute 08/14/2020   DDD (degenerative disc disease), lumbar    Depression    DOE (dyspnea on  exertion)    10-29-2020  per pt can do house work without sob, when walks/ stairs stops frequently  (stated recovers quickly when stops)   Emphysema lung (HCC)    Epidermoid cyst 10/29/2018   Recurrent. Located on left hip at iliac crest 2" anterior to midaxillary line. Removed 2013, 2020, 2022.    Epigastric pain 11/16/2020   Fibroid 2003   GERD (gastroesophageal reflux disease)    History of radiation therapy    Left lung- 10/06/20-10/16/20- Dr. Antony Blackbird   Leg cramping 11/13/2019   Lumbar spine pain 01/09/2015   Menopausal vaginal dryness 11/13/2019   Multiple closed fractures of ribs of left side 08/20/2022   Nocturnal enuresis 06/23/2020   Chronic overnight "leaking" of unknown source. Likely urine, but reports no color or odor. Volume enough for liners or light pads. Occurs every night.    Primary adenocarcinoma of lower lobe of left lung (HCC) 07/2020   oncologist--- dr Arbutus Ped---  dx 11/ 2021 bilateral lower lobe masses,  s/p bronchoscopy w/ bx's 07-28-2020 malignancy cells on left ;  completed SBRT bilateral lower lung 10-16-2020 5 fractions   Radiation-induced dermatitis    on 10-29-2020 pt complaint stomach and chest areas,  completed SBRT 10-16-2020   Seasonal allergies    Stage 3 severe COPD by GOLD classification [facility])    pulmonologist--- dr Tonia Brooms,  using anoro inhaler daily   Wears glasses       PAST SURGICAL HISTORY: Past Surgical History:  Procedure Laterality Date   ANTERIOR CERVICAL DECOMP/DISCECTOMY FUSION  03-08-2003 @ [facility]   C3 --- C6   ARTHROTOMY Right 11/09/2015   Procedure: RIGHT WRIST DISTAL RADIAL ULNAR JOINT ARTHROTOMY AND DEBRIDEMENT,  AND ;  Surgeon: Bradly Bienenstock, MD;  Location: [facility] OR;  Service: Orthopedics;  Laterality: Right;   BRONCHIAL BIOPSY  07/28/2020   Procedure: BRONCHIAL BIOPSIES;  Surgeon: Josephine Igo, DO;  Location: [facility]COPY;  Service: Pulmonary;;   BRONCHIAL BIOPSY  08/17/2021   Procedure: BRONCHIAL BIOPSIES;  Surgeon: Josephine Igo, DO;  Location: [facility];  Service: Pulmonary;;   BRONCHIAL BRUSHINGS  07/28/2020   Procedure: BRONCHIAL BRUSHINGS;  Surgeon: Josephine Igo, DO;  Location: [facility];  Service: Pulmonary;;   BRONCHIAL BRUSHINGS  08/17/2021   Procedure: BRONCHIAL BRUSHINGS;  Surgeon: Josephine Igo, DO;  Location: [facility];  Service: Pulmonary;;   BRONCHIAL NEEDLE ASPIRATION BIOPSY  07/28/2020   Procedure: BRONCHIAL NEEDLE ASPIRATION BIOPSIES;  Surgeon: Josephine Igo, DO;  Location: [facility];  Service: Pulmonary;;   BRONCHIAL WASHINGS  07/28/2020   Procedure: BRONCHIAL WASHINGS;  Surgeon: Josephine Igo, DO;  Location: [facility];  Service: Pulmonary;;   BRONCHIAL WASHINGS  08/17/2021   Procedure: BRONCHIAL WASHINGS;  Surgeon: Josephine Igo, DO;  Location: [facility];  Service: Pulmonary;;   COLONOSCOPY     CYSTOSCOPY WITH RETROGRADE PYELOGRAM, URETEROSCOPY AND STENT PLACEMENT N/A 09/18/2020   Procedure: CYSTOSCOPY WITH RETROGRADE PYELOGRAM bilateral,URETEROSCOPY  AND  STENT PLACEMENT right ureter;  Surgeon: Sebastian Ache, MD;  Location: [facility] ORS;  Service: Urology;  Laterality: N/A;  1 HR   HARDWARE REMOVAL Right 07/24/2013   Procedure: RIGHT WRIST HARDWARE REMOVAL, JOINT RELEASE, RIGHT HAND MANIPULATION UNDER ANESTHESIA;  Surgeon: Sharma Covert, MD;  Location: [facility];  Service: Orthopedics;  Laterality: Right;   MASS EXCISION Left 10/14/2021   Procedure: EXCISION LEFT BACK CYSTIC MASS;  Surgeon: Sheliah Hatch, De Blanch, MD;  Location: [facility];  Service: General;  Laterality: Left;   OPEN REDUCTION INTERNAL FIXATION (ORIF) DISTAL RADIAL FRACTURE Right 11/08/2012   Procedure: OPEN REDUCTION INTERNAL FIXATION (ORIF) RIGHT DISTAL RADIUS FRACTURE;  Surgeon: Sharma Covert, MD;  Location: [facility];  Service: Orthopedics;  Laterality: Right;   TRANSURETHRAL RESECTION OF BLADDER TUMOR N/A 09/18/2020   Procedure: TRANSURETHRAL  RESECTION OF BLADDER TUMOR (TURBT);  Surgeon: Sebastian Ache, MD;  Location: [facility];  Service: Urology;  Laterality: N/A;   TRANSURETHRAL RESECTION OF BLADDER TUMOR N/A 11/04/2020   Procedure: RESTAGING TRANSURETHRAL RESECTION OF BLADDER TUMOR (TURBT); BILATERAL RETROGRADE PYELOGRAM;  Surgeon: Sebastian Ache, MD;  Location: [facility];  Service: Urology;  Laterality: N/A;  1 HR   VIDEO BRONCHOSCOPY WITH ENDOBRONCHIAL NAVIGATION N/A 07/28/2020   Procedure: VIDEO BRONCHOSCOPY WITH ENDOBRONCHIAL NAVIGATION;  Surgeon: Josephine Igo, DO;  Location: [facility];  Service: Pulmonary;  Laterality: N/A;   VIDEO BRONCHOSCOPY WITH RADIAL ENDOBRONCHIAL ULTRASOUND  08/17/2021   Procedure: VIDEO BRONCHOSCOPY WITH RADIAL ENDOBRONCHIAL ULTRASOUND;  Surgeon: Josephine Igo, DO;  Location: [facility];  Service: Pulmonary;;   WRIST ARTHROPLASTY Right 11/09/2015   Procedure: OR DISTAL ULNAR RESECTION ARTHROPLASTY ;  Surgeon: Bradly Bienenstock, MD;  Location: [facility] OR;  Service: Orthopedics;  Laterality: Right;   WRIST ARTHROSCOPY WITH DEBRIDEMENT Right 07/21/2014   Procedure: RIGHT WRIST DISTAL RADIAL ULNAR JOINT DEBRIDEMENT AND JOINT RELEASE POSSIBLE TENDON INTERPOSITION;  Surgeon: Sharma Covert, MD;  Location: [facility] OR;  Service: Orthopedics;  Laterality: Right;    FAMILY HISTORY:  Family History  Problem Relation Age of Onset   Hypertension Mother    Hyperlipidemia Mother    Dementia Father    Lung cancer Father    Parkinson's disease Father    Early death Sister 64       Drug overdose   Throat cancer Brother    Hypertension Daughter    Diabetes Daughter     SOCIAL HISTORY:  Social History   Socioeconomic History   Marital status: Married    Spouse name: Sherilyn Cooter Alter   Number of children: 2   Years of education: 12   Highest education level: Not on file  Occupational History   Occupation: Unemployed     Employer:  [employer]    Comment: Sweep   Tobacco Use   Smoking status: Former     Packs/day: 1.00    Years: 50.00    Additional pack years: 0.00    Total pack years: 50.00    Types: Cigarettes    Start date: 04/13/1975    Quit date: 07/14/2020    Years since quitting: 2.5    Passive exposure: Past   Smokeless tobacco: Never  Vaping Use   Vaping Use: Never used  Substance and Sexual Activity   Alcohol use: Yes    Comment: Holidays only   Drug use: Never   Sexual activity: Not Currently    Partners: Male    Birth control/protection: Post-menopausal  Other Topics  Concern   Not on file  Social History Narrative   Patient lives alone currently in [place].    Patient is married, they are living separately at this time.    Patient enjoys watching tv, cooking, and spending time with her children and grandchildren.    Social Determinants of Health   Financial Resource Strain: Low Risk  (12/13/2022)   Overall Financial Resource Strain (CARDIA)    Difficulty of Paying Living Expenses: Not hard at all  Food Insecurity: No Food Insecurity (01/13/2023)   Hunger Vital Sign    Worried About Running Out of Food in the Last Year: Never true    Ran Out of Food in the Last Year: Never true  Transportation Needs: No Transportation Needs (01/13/2023)   PRAPARE - Administrator, Civil Service (Medical): No    Lack of Transportation (Non-Medical): No  Physical Activity: Inactive (12/13/2022)   Exercise Vital Sign    Days of Exercise per Week: 0 days    Minutes of Exercise per Session: 0 min  Stress: No Stress Concern Present (12/13/2022)   [employer] of Occupational Health - Occupational Stress Questionnaire    Feeling of Stress : Not at all  Social Connections: Moderately Integrated (12/13/2022)   Social Connection and Isolation Panel [NHANES]    Frequency of Communication with Friends and Family: More than three times a week    Frequency of Social Gatherings with Friends and Family: More than three times a week    Attends Religious Services: More than 4 times  per year    Active Member of [employer] or Organizations: Yes    Attends Banker Meetings: More than 4 times per year    Marital Status: Separated  Intimate Partner Violence: Not At Risk (01/13/2023)   Humiliation, Afraid, Rape, and Kick questionnaire    Fear of Current or Ex-Partner: No    Emotionally Abused: No    Physically Abused: No    Sexually Abused: No    ALLERGIES: Codeine and Prednisone  MEDICATIONS:  Current Facility-Administered Medications  Medication Dose Route Frequency Provider Last Rate Last Admin   0.9 %  sodium chloride infusion   Intravenous PRN Westley Chandler, MD 10 mL/hr at 01/18/23 1100 Infusion Verify at 01/18/23 1100   acetaminophen (TYLENOL) tablet 650 mg  650 mg Per Tube Q6H PRN Cheri Fowler, MD       Or   acetaminophen (TYLENOL) suppository 650 mg  650 mg Rectal Q6H PRN Cheri Fowler, MD       albuterol (PROVENTIL) (2.5 MG/3ML) 0.083% nebulizer solution 2.5 mg  2.5 mg Nebulization Q2H PRN Westley Chandler, MD   2.5 mg at 01/16/23 0435   arformoterol (BROVANA) nebulizer solution 15 mcg  15 mcg Nebulization BID Steffanie Dunn, DO   15 mcg at 01/18/23 0759   atorvastatin (LIPITOR) tablet 40 mg  40 mg Per Tube QHS Bevelyn Ngo, NP   40 mg at 01/17/23 2227   baclofen (LIORESAL) tablet 5 mg  5 mg Per Tube TID Tobey Grim, NP   5 mg at 01/18/23 0954   budesonide (PULMICORT) nebulizer solution 0.25 mg  0.25 mg Nebulization BID Karie Fetch P, DO   0.25 mg at 01/18/23 0759   calcium carbonate (TUMS - dosed in mg elemental calcium) chewable tablet 200 mg of elemental calcium  1 tablet Per Tube BID Bevelyn Ngo, NP   200 mg of elemental calcium at 01/18/23 0954   Chlorhexidine Gluconate  Cloth 2 % PADS 6 each  6 each Topical Daily Cheri Fowler, MD   6 each at 01/17/23 1200   citalopram (CELEXA) tablet 10 mg  10 mg Per Tube Daily Bevelyn Ngo, NP   10 mg at 01/17/23 1029   docusate (COLACE) 50 MG/5ML liquid 100 mg  100 mg Per Tube BID Cheri Fowler,  MD   100 mg at 01/17/23 2227   docusate sodium (COLACE) capsule 100 mg  100 mg Oral BID PRN Bevelyn Ngo, NP       famotidine (PEPCID) tablet 20 mg  20 mg Per Tube BID Bevelyn Ngo, NP   20 mg at 01/18/23 0954   feeding supplement (OSMOLITE 1.5 CAL) liquid 1,000 mL  1,000 mL Per Tube Continuous Cheri Fowler, MD 35 mL/hr at 01/18/23 1100 Infusion Verify at 01/18/23 1100   feeding supplement (PROSource TF20) liquid 60 mL  60 mL Per Tube Daily Cheri Fowler, MD   60 mL at 01/18/23 0955   fentaNYL (SUBLIMAZE) bolus via infusion 25-100 mcg  25-100 mcg Intravenous Q15 min PRN Paliwal, Eliezer Lofts, MD   50 mcg at 01/18/23 0600   fentaNYL (SUBLIMAZE) injection 25 mcg  25 mcg Intravenous Once Paliwal, Aditya, MD       fentaNYL in NS (36mcg/ml) infusion-PREMIX  25-200 mcg/hr Intravenous Continuous Paliwal, Aditya, MD 10 mL/hr at 01/18/23 1100 100 mcg/hr at 01/18/23 1100   gabapentin (NEURONTIN) capsule 600 mg  600 mg Oral TID PRN Alfredo Martinez, MD       heparin ADULT infusion 100 units/mL (25000 units/260mL)  1,200 Units/hr Intravenous Continuous Bell, Michelle T, RPH       hydrocortisone (ANUSOL-HC) 2.5 % rectal cream   Rectal BID Alfredo Martinez, MD   Given at 01/18/23 0946   insulin aspart (novoLOG) injection 0-15 Units  0-15 Units Subcutaneous Q4H Paliwal, Aditya, MD   3 Units at 01/17/23 1148   ipratropium-albuterol (DUONEB) 0.5-2.5 (3) MG/3ML nebulizer solution 3 mL  3 mL Nebulization Q4H PRN Dameron, Nolberto Hanlon, DO   3 mL at 01/15/23 1325   melatonin tablet 3 mg  3 mg Oral QHS PRN Lincoln Brigham, MD   3 mg at 01/15/23 2129   metoprolol tartrate (LOPRESSOR) injection 2.5 mg  2.5 mg Intravenous Q6H PRN Bevelyn Ngo, NP       midazolam (VERSED) injection 1-2 mg  1-2 mg Intravenous Q1H PRN Luciano Cutter, MD   2 mg at 01/18/23 0915   mirtazapine (REMERON) tablet 15 mg  15 mg Per Tube QHS Bevelyn Ngo, NP   15 mg at 01/17/23 2227   multivitamin with minerals tablet 1 tablet  1 tablet Per Tube  Daily Bevelyn Ngo, NP   1 tablet at 01/18/23 0955   naloxone [facility]) injection 0.4 mg  0.4 mg Intravenous PRN Fayette Pho, MD       norepinephrine (LEVOPHED) 4mg  in (0.016 mg/mL) premix infusion  0-40 mcg/min Intravenous Titrated Cheri Fowler, MD 7.5 mL/hr at 01/18/23 1100 2 mcg/min at 01/18/23 1100   ondansetron (ZOFRAN) injection 4 mg  4 mg Intravenous Q8H PRN Solon Augusta S, PA   4 mg at 01/13/23 2143   Oral care mouth rinse  15 mL Mouth Rinse Q2H Chand, Sudham, MD   15 mL at 01/18/23 0955   Oral care mouth rinse  15 mL Mouth Rinse PRN Cheri Fowler, MD       piperacillin-tazobactam (ZOSYN) IVPB 3.375 g  3.375 g Intravenous Q8H Millen,  Jill Poling, RPH   Stopped at 01/18/23 0454   polyethylene glycol (MIRALAX / GLYCOLAX) packet 17 g  17 g Oral Daily PRN Bevelyn Ngo, NP       polyethylene glycol (MIRALAX / GLYCOLAX) packet 17 g  17 g Per Tube Daily Cheri Fowler, MD   17 g at 01/17/23 1029   propofol (DIPRIVAN) 1000 MG/100ML infusion  0-50 mcg/kg/min Intravenous Titrated Tobey Grim, NP 22.8 mL/hr at 01/18/23 1100 50 mcg/kg/min at 01/18/23 1100   revefenacin (YUPELRI) nebulizer solution 175 mcg  175 mcg Nebulization Daily Karie Fetch P, DO   175 mcg at 01/18/23 0759   sodium chloride flush (NS) 0.9 % injection 10-40 mL  10-40 mL Intracatheter Q12H Chand, Garnet Sierras, MD   20 mL at 01/18/23 0947   sodium chloride flush (NS) 0.9 % injection 10-40 mL  10-40 mL Intracatheter PRN Cheri Fowler, MD       thiamine (VITAMIN B1) tablet 100 mg  100 mg Per Tube Daily Cheri Fowler, MD   100 mg at 01/18/23 0954    REVIEW OF SYSTEMS: Patient ventilated.     PHYSICAL EXAM:  Wt Readings from Last 3 Encounters:  01/18/23 172 lb 9.9 oz (78.3 kg)  01/09/23 174 lb 9.6 oz (79.2 kg)  01/06/23 172 lb (78 kg)   Temp Readings from Last 3 Encounters:  01/18/23 98.2 F (36.8 C) (Axillary)  12/19/22 (!) 97.5 F (36.4 C)  11/28/22 (!) 96.9 F (36.1 C) (Temporal)   BP Readings from Last 3  Encounters:  01/18/23 (!) 83/59  01/09/23 130/80  01/06/23 118/62   Pulse Readings from Last 3 Encounters:  01/18/23 96  01/09/23 (!) 118  01/06/23 73   Pain Assessment Pain Score: 0-No pain/10  Patient is sedated on ventilator in no acute distress. Cardiopulmonary assessment is negative for acute distress and she exhibits normal effort.     KPS = 10  100 - Normal; no complaints; no evidence of disease. 90   - Able to carry on normal activity; minor signs or symptoms of disease. 80   - Normal activity with effort; some signs or symptoms of disease. 33   - Cares for self; unable to carry on normal activity or to do active work. 60   - Requires occasional assistance, but is able to care for most of his personal needs. 50   - Requires considerable assistance and frequent medical care. 40   - Disabled; requires special care and assistance. 30   - Severely disabled; hospital admission is indicated although death not imminent. 20   - Very sick; hospital admission necessary; active supportive treatment necessary. 10   - Moribund; fatal processes progressing rapidly. 0     - Dead  Karnofsky DA, Abelmann WH, Craver LS and Burchenal [facility] [phone]) The use of the nitrogen mustards in the palliative treatment of carcinoma: with particular reference to bronchogenic carcinoma Cancer 1 [id number]  LABORATORY DATA:  Lab Results  Component Value Date   WBC 19.8 (H) 01/18/2023   HGB 11.2 (L) 01/18/2023   HCT 34.8 (L) 01/18/2023   MCV 87.0 01/18/2023   PLT 376 01/18/2023   Lab Results  Component Value Date   NA 136 01/18/2023   K 3.7 01/18/2023   CL 97 (L) 01/18/2023   CO2 30 01/18/2023   Lab Results  Component Value Date   ALT 23 01/11/2023   AST 15 01/11/2023   ALKPHOS 84 01/11/2023   BILITOT 1.6 (H) 01/11/2023  RADIOGRAPHY: DG CHEST PORT 1 VIEW  Result Date: 01/17/2023 CLINICAL DATA:  Hypoxia EXAM: PORTABLE CHEST 1 VIEW COMPARISON:  Chest x-ray 01/17/2023.  Chest CT 01/16/2023.  FINDINGS: Endotracheal tube tip is 2.5 cm above the carina. Enteric tube tip is in the stomach. Right upper extremity PICC terminates in the SVC. Right lower lung masslike opacity is unchanged. There is a small right pleural effusion which has increased. No pneumothorax. Cardiac silhouette appears stable. IMPRESSION: 1. Right lower lung masslike opacity is unchanged. 2. Small right pleural effusion which has increased. 3. Support apparatus as described. Electronically Signed   By: Darliss Cheney M.D.   On: 01/17/2023 23:04   DG Abd 1 View  Result Date: 01/17/2023 CLINICAL DATA:  Nasogastric tube EXAM: ABDOMEN - 1 VIEW COMPARISON:  Abdominal x-ray 01/17/2023 FINDINGS: The enteric tube tip and side-port are in the stomach. The tip of the tube is near the gastric fundus. Position is unchanged from prior. Examination is otherwise stable. IMPRESSION: The enteric tube tip and side-port are in the stomach. Electronically Signed   By: Darliss Cheney M.D.   On: 01/17/2023 18:48   [place] EKG SITE RITE  Result Date: 01/17/2023 If Site Rite image not attached, placement could not be confirmed due to current cardiac rhythm.  DG Abd Portable 1V  Result Date: 01/17/2023 CLINICAL DATA:  OG tube placement EXAM: PORTABLE ABDOMEN - 1 VIEW COMPARISON:  None Available. FINDINGS: OG tube tip and side port are in the stomach. Mild gaseous distention of multiple bowel loops, predominantly colon. Consolidation of the right lower lobe. IMPRESSION: OG tube tip and side port are in the stomach. Electronically Signed   By: Allegra Lai M.D.   On: 01/17/2023 17:15   DG Chest Port 1 View  Result Date: 01/17/2023 CLINICAL DATA:  Provided history: Respiratory failure. EXAM: PORTABLE CHEST 1 VIEW COMPARISON:  Prior chest radiographs 01/16/2023 and earlier. Chest CT 01/16/2023. FINDINGS: ET tube present with tip 1.2 cm above the level of the carina. An enteric tube passes below the level of the left hemidiaphragm, exits the field of view,  and returns within the field of view terminating at the expected level of the gastric fundus. The cardiomediastinal silhouette is unchanged. Aortic atherosclerosis. Known right hilar lung mass with mediastinal invasion, better delineated on the recent prior chest CT of 01/16/2023. Persistent postobstructive atelectasis and consolidation within the right middle and right lower lobes, similar to the prior chest radiograph of 01/16/2023. Persistent mild ill-defined opacity within the left lung base which may reflect atelectasis and/or airspace consolidation. No evidence of pleural effusion or pneumothorax. No acute osseous abnormality identified. IMPRESSION: 1. Support apparatus as described. 2. Known right hilar lung mass with mediastinal invasion, better delineated on the recent prior chest CT of 01/16/2023. 3. Postobstructive consolidation and atelectasis within the right middle and right lower lobes, similar to the prior chest radiograph of 01/16/2023. 4. Mild atelectasis and/or airspace consolidation within the left lung base, unchanged from the prior chest radiograph. 5.  Aortic Atherosclerosis (ICD10-I70.0). Electronically Signed   By: Jackey Loge D.O.   On: 01/17/2023 08:16   DG CHEST PORT 1 VIEW  Result Date: 01/16/2023 CLINICAL DATA:  Intubation. EXAM: PORTABLE CHEST 1 VIEW COMPARISON:  Jan 15, 2023. FINDINGS: Stable cardiomediastinal silhouette. Endotracheal and nasogastric tubes are in grossly good position. Stable bibasilar opacities are noted, right greater than left, concerning for subsegmental atelectasis or infiltrates and associated pleural effusions. Bony thorax is unremarkable. IMPRESSION: Endotracheal and nasogastric tubes are in  grossly good position. Stable bibasilar opacities as described above. Electronically Signed   By: Lupita Raider M.D.   On: 01/16/2023 13:14   CT Angio Chest Pulmonary Embolism (PE) W or WO Contrast  Result Date: 01/16/2023 CLINICAL DATA:  Shortness of breath. Rule  out pulmonary embolus. History of lung cancer. * Tracking Code: BO *. EXAM: CT ANGIOGRAPHY CHEST WITH CONTRAST TECHNIQUE: Multidetector CT imaging of the chest was performed using the standard protocol during bolus administration of intravenous contrast. Multiplanar CT image reconstructions and MIPs were obtained to evaluate the vascular anatomy. RADIATION DOSE REDUCTION: This exam was performed according to the departmental dose-optimization program which includes automated exposure control, adjustment of the mA and/or kV according to patient size and/or use of iterative reconstruction technique. CONTRAST:  75mL OMNIPAQUE IOHEXOL 350 MG/ML SOLN COMPARISON:  PET-CT 12/16/2022 and CT chest 11/24/22. FINDINGS: Cardiovascular: Satisfactory opacification of the pulmonary arteries to the segmental level. No evidence of pulmonary embolism. Cardiac enlargement. No pericardial effusion. Aortic atherosclerosis. Mediastinum/Nodes: Thyroid gland appears normal. The trachea appears patent and midline. Unremarkable appearance of the esophagus. Bilateral mediastinal nodal metastasis identified. Index left pre-vascular lymph node measures 2.1 cm, image 42/6. Previously 1.9 cm. Right paratracheal nodal mass measures 2.3 x 2.0 cm, image 45/6. Formally this measured 1.1 x 1.1 cm. Left paratracheal node is new measuring 1.3 cm, image 44/6. Lungs/Pleura: There is a large right hilar lung mass with signs of mediastinal invasion. This measures 5.8 x 4.4 cm, image 58/6. Formally this measured approximately 4.5 x 4.2 cm. This mass encases and narrows the right middle lobe and right lower lobar pulmonary arteries and their proximal segmental branches. The tumor encases and completely occludes the right lower lobe bronchus with new marked postobstructive consolidation and volume loss within the right lower lobe, image 57/6. Encasement and narrowing of the right middle lobe bronchus is also identified with new partial atelectasis of the right  middle lobe. New diffuse airspace consolidation involving nearly the entire left lower lobe which may reflect aspiration or pneumonia. Moderate to severe changes of emphysema. Upper Abdomen: No acute abnormality. Musculoskeletal: No acute or suspicious osseous findings. 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 Review of the MIP images confirms the above findings. IMPRESSION: 1. No evidence for acute pulmonary embolus. 2. Interval increase in size of right hilar lung mass with signs of mediastinal invasion. This mass encases and narrows the right middle lobe and right lower lobar pulmonary arteries and their proximal segmental branches. The tumor encases and completely occludes the right lower lobe bronchus with new marked postobstructive consolidation and volume loss within the right lower lobe. Encasement and narrowing of the right middle lobe bronchus is also identified with new partial atelectasis of the right middle lobe. 3. New diffuse airspace consolidation involving nearly the entire left lower lobe which may reflect aspiration or pneumonia. 4. Interval progression of mediastinal nodal metastasis. 5.  Aortic Atherosclerosis (ICD10-I70.0). Electronically Signed   By: Signa Kell M.D.   On: 01/16/2023 12:16   DG CHEST PORT 1 VIEW  Result Date: 01/15/2023 CLINICAL DATA:  Pneumonia.  Follow-up study. EXAM: PORTABLE CHEST 1 VIEW COMPARISON:  01/12/2023 and older exams. FINDINGS: Cardiac silhouette normal in size. Right greater than left lung base opacities obscure the right and partly obscure the left hemidiaphragms. Remainder of the lungs is clear. No pneumothorax. IMPRESSION: 1. Right greater than left lung base opacities consistent with a combination of pleural effusions and atelectasis and/or pneumonia, without change compared  to the most recent prior study. No convincing pulmonary edema. No new lung abnormalities. Electronically Signed   By: Amie Portland M.D.   On:  01/15/2023 09:19   ECHOCARDIOGRAM COMPLETE BUBBLE STUDY  Result Date: 01/12/2023    ECHOCARDIOGRAM REPORT   Patient Name:   [name] [facility] Date of Exam: 01/12/2023 Medical Rec #:  [id number]      Height:       68.0 in Accession #:    [id number]     Weight:       174.6 lb Date of Birth:  September 13, 1955      BSA:          1.929 m Patient Age:    66 years       BP:           150/82 mmHg Patient Gender: F              HR:           101 bpm. Exam Location:  Inpatient Procedure: 2D Echo, Cardiac Doppler, Color Doppler, Saline Contrast Bubble Study            and Intracardiac Opacification Agent Indications:    SOB  History:        Patient has no prior history of Echocardiogram examinations.                 Signs/Symptoms:Shortness of Breath; Risk Factors:Former Smoker.  Sonographer:    Raeford Razor Sonographer#2:  Irving Burton Senior Referring Phys: [id number] CARINA M BROWN  Sonographer Comments: Patient had to be scanned sitting up due to dyspnea, could not tolerate any probe pressure or repositioning. IMPRESSIONS  1. Left ventricular ejection fraction, by estimation, is 50 to 55%. The left ventricle has low normal function. The left ventricle has no regional wall motion abnormalities. Indeterminate diastolic filling due to E-A fusion. There is moderate hypokinesis of the left ventricular, apical apical segment.  2. Right ventricular systolic function is normal. The right ventricular size is normal. There is moderately elevated pulmonary artery systolic pressure. The estimated right ventricular systolic pressure is 46.2 mmHg.  3. A small pericardial effusion is present. The pericardial effusion is circumferential. There is no evidence of cardiac tamponade.  4. The mitral valve is grossly normal. No evidence of mitral valve regurgitation.  5. The aortic valve is tricuspid. Aortic valve regurgitation is not visualized.  6. The inferior vena cava is dilated in size with >50% respiratory variability, suggesting right atrial pressure of 8  mmHg.  7. Agitated saline contrast bubble study was negative, with no evidence of any interatrial shunt. Comparison(s): No prior Echocardiogram. FINDINGS  Left Ventricle: Left ventricular ejection fraction, by estimation, is 50 to 55%. The left ventricle has low normal function. The left ventricle has no regional wall motion abnormalities. Moderate hypokinesis of the left ventricular, apical apical segment. Definity contrast agent was given IV to delineate the left ventricular endocardial borders. The left ventricular internal cavity size was normal in size. There is no left ventricular hypertrophy. Indeterminate diastolic filling due to E-A fusion. Right Ventricle: The right ventricular size is normal. No increase in right ventricular wall thickness. Right ventricular systolic function is normal. There is moderately elevated pulmonary artery systolic pressure. The tricuspid regurgitant velocity is 3.09 m/s, and with an assumed right atrial pressure of 8 mmHg, the estimated right ventricular systolic pressure is 46.2 mmHg. Left Atrium: Left atrial size was normal in size. Right Atrium: Right atrial size was normal in size. Pericardium:  A small pericardial effusion is present. The pericardial effusion is circumferential. There is no evidence of cardiac tamponade. Mitral Valve: The mitral valve is grossly normal. No evidence of mitral valve regurgitation. Tricuspid Valve: The tricuspid valve is grossly normal. Tricuspid valve regurgitation is trivial. Aortic Valve: The aortic valve is tricuspid. Aortic valve regurgitation is not visualized. Aortic regurgitation PHT measures 379 msec. Pulmonic Valve: The pulmonic valve was normal in structure. Pulmonic valve regurgitation is not visualized. Aorta: The aortic root and ascending aorta are structurally normal, with no evidence of dilitation. Venous: The inferior vena cava is dilated in size with greater than 50% respiratory variability, suggesting right atrial pressure of  8 mmHg. IAS/Shunts: The interatrial septum is aneurysmal. No atrial level shunt detected by color flow Doppler. Agitated saline contrast was given intravenously to evaluate for intracardiac shunting. Agitated saline contrast bubble study was negative, with no evidence of any interatrial shunt.  LEFT VENTRICLE PLAX 2D LVIDd:         4.25 cm LVIDs:         3.25 cm LV PW:         0.90 cm LV IVS:        0.90 cm LVOT diam:     1.80 cm LV SV:         41 LV SV Index:   21 LVOT Area:     2.54 cm  RIGHT VENTRICLE RV S prime:     16.30 cm/s TAPSE (M-mode): 2.4 cm LEFT ATRIUM             Index        RIGHT ATRIUM           Index LA diam:        2.60 cm 1.35 cm/m   RA Area:     14.40 cm LA Vol (A2C):   43.0 ml 22.29 ml/m  RA Volume:   36.10 ml  18.71 ml/m LA Vol (A4C):   65.3 ml 33.85 ml/m LA Biplane Vol: 55.0 ml 28.51 ml/m  AORTIC VALVE LVOT Vmax:   75.40 cm/s LVOT Vmean:  59.000 cm/s LVOT VTI:    0.161 m AI PHT:      379 msec  AORTA Ao Root diam: 3.70 cm Ao Asc diam:  3.20 cm TRICUSPID VALVE TR Peak grad:   38.2 mmHg TR Vmax:        309.00 cm/s  SHUNTS Systemic VTI:  0.16 m Systemic Diam: 1.80 cm Zoila Shutter MD Electronically signed by Zoila Shutter MD Signature Date/Time: 01/12/2023/4:02:48 PM    Final    DG Chest Portable 1 View  Result Date: 01/12/2023 CLINICAL DATA:  Shortness of breath. EXAM: PORTABLE CHEST 1 VIEW COMPARISON:  Chest CT without contrast 11/24/2022 FINDINGS: The lungs are emphysematous with decreased inspiration. There is interval development of a small right pleural effusion with overlying opacities in the right base consistent with atelectasis or pneumonia. No other focal acute process is seen. Heart size and vasculature are normal. The mediastinum is normally outlined. There is aortic atherosclerosis. Fusion plating partially visible lower C-spine. IMPRESSION: 1. Interval development of a small right pleural effusion with overlying opacities in the right base consistent with atelectasis or  pneumonia. Clinical correlation and radiographic follow-up recommended. 2. COPD. 3. Aortic atherosclerosis. Electronically Signed   By: Almira Bar M.D.   On: 01/12/2023 00:32   CT ABDOMEN PELVIS W CONTRAST  Result Date: 01/12/2023 CLINICAL DATA:  Abdomen pain EXAM: CT ABDOMEN AND PELVIS WITH CONTRAST TECHNIQUE:  Multidetector CT imaging of the abdomen and pelvis was performed using the standard protocol following bolus administration of intravenous contrast. RADIATION DOSE REDUCTION: This exam was performed according to the departmental dose-optimization program which includes automated exposure control, adjustment of the mA and/or kV according to patient size and/or use of iterative reconstruction technique. CONTRAST:  75mL OMNIPAQUE IOHEXOL 350 MG/ML SOLN COMPARISON:  PET CT 12/16/2022, CT 09/19/2020 FINDINGS: Lower chest: Lung bases demonstrate heterogeneous airspace disease in the right lower lobe. Incompletely visualized heterogenous right hilar mass measuring approximately 5.4 by 4.9 cm with encasement of multiple pulmonary vessels. Mild mass effect on the right side of left atrium. Emphysema with large bulla at the right base containing fluid level. Heterogeneous consolidation and airspace disease in the right lower lobe. Band like scarring in the left base. Hepatobiliary: No focal liver abnormality is seen. No gallstones, gallbladder wall thickening, or biliary dilatation. Pancreas: No ductal dilatation. Soft tissue stranding at the pancreatico duodenal groove. Spleen: Normal in size without focal abnormality. Adrenals/Urinary Tract: Adrenal glands are normal. Cyst upper pole right kidney, no imaging follow-up is recommended. Mild right hydronephrosis. Bladder is unremarkable. Stomach/Bowel: The stomach is nonenlarged. Wall thickening and stranding at the duodenal bulb and second portion of duodenum. Negative appendix. Vascular/Lymphatic: Advanced aortic atherosclerosis. No aneurysm. Retroaortic left  renal vein. Reproductive: Uterus and bilateral adnexa are unremarkable. Other: Negative for free air.  No evidence for pelvic effusion. Musculoskeletal: No acute or suspicious osseous abnormality. IMPRESSION: 1. Wall thickening and stranding at the duodenal bulb and second portion of duodenum, with inflammatory changes at the pancreatic head and uncinate process. Findings could be secondary to pancreatitis and duodenitis. Suggest correlation with enzymes. 2. Incompletely visualized large heterogeneous right hilar and infrahilar mass. Heterogeneous consolidations and ground-glass disease in the right lower lobe potentially due to postobstructive pneumonia. Large bulla in the right lower lobe now containing fluid level suggesting superimposed infection. 3. Mild right hydronephrosis. Aortic Atherosclerosis (ICD10-I70.0) and Emphysema (ICD10-J43.9). Electronically Signed   By: Jasmine Pang M.D.   On: 01/12/2023 00:22      IMPRESSION/PLAN: 1. 67 y.o. with recurrent lung cancer causing airway obstruction; s/p bronchoscopy with results pending   Today we discussed with the patient's daughter the workup and natural course of lung cancer when it is occluding an airway, highlighting the role of radiotherapy in the management. Recent imaging visualized tumor occlusion of the right lower lobe bronchus. Due to progression of disease, patient was placed on a ventilator due for increased work of breathing. She is a good candidate for curative radiotherapy to open up her airway and reverse disease progression. Accordingly, I recommend at least 30 Gy in 10 fractions to the right hilar lung mass and enlarged lymph nodes, with the possibility of extending to a more curative dose level if disease seems confined to the chest and patient tolerance. We discussed the available radiation techniques, and focused on the details and logistics of delivery. We discussed and outlined the risks, benefits, short and long-term effects  associated with radiotherapy. [name] was encouraged to ask questions that were answered to her stated satisfaction. [name] was at work, so a Industrial/product designer consent was obtained today and placed in the patient's chart. Patient is scheduled for CT simulation at 10:30 am and will begin her first of ten treatments today.   I personally spent 60 minutes in this encounter including chart review, reviewing radiological studies, meeting face-to-face with the patient, entering orders and completing documentation.    Joyice Faster,  PA-C    Margaretmary Dys, MD  [facility]  Radiation Oncology Direct Dial: [phone]  Fax: [phone] [email or web address]  Skype  [email or web address]

## 2023-01-18 NOTE — Progress Notes (Signed)
  Radiation Oncology         [phone]) [phone] ________________________________  Name: [name] MRN: [id number]  Date: 01/18/2023  DOB: Dec 22, 1955  INPATIENT  SIMULATION AND TREATMENT PLANNING NOTE    ICD-10-CM   1. Partial obstruction of airway  J98.8     2. Mass of upper lobe of right lung  R91.8       DIAGNOSIS:  67 y/o woman with obstructing right lower and middle lobe lung mass secondary to presumed NSCLC, pathology pending - On a ventilator  NARRATIVE:  The patient was brought to the CT Simulation planning suite.  Identity was confirmed.  All relevant records and images related to the planned course of therapy were reviewed.  The patient freely provided informed written consent to proceed with treatment after reviewing the details related to the planned course of therapy. The consent form was witnessed and verified by the simulation staff.  Then, the patient was set-up in a stable reproducible  supine position for radiation therapy.  CT images were obtained.  Surface markings were placed.  The CT images were loaded into the planning software.  Then the target and avoidance structures were contoured.  Treatment planning then occurred.  The radiation prescription was entered and confirmed.  Then, I designed and supervised the construction of a total of 6 medically necessary complex treatment devices, including a BodyFix immobilization mold custom fitted to the patient along with 5 multileaf collimators conformally shaped radiation around the treatment target while shielding critical structures such as the heart and spinal cord maximally.  I have requested : 3D Simulation  I have requested a DVH of the following structures: Left lung, right lung, spinal cord, heart, esophagus, and target.  I have ordered:Nutrition Consult  SPECIAL TREATMENT PROCEDURE:  The planned course of therapy using radiation constitutes a special treatment procedure. Special care is required in the management of this  patient for the following reasons.  The patient will be receiving concurrent chemotherapy requiring careful monitoring for increased toxicities of treatment including periodic laboratory values.  The special nature of the planned course of radiotherapy will require increased physician supervision and oversight to ensure patient's safety with optimal treatment outcomes.   The planned course of therapy using radiation constitutes a special treatment procedure. Special care is required in the management of this patient for the following reasons. This treatment constitutes a Special Treatment Procedure for the following reason: [ Retreatment in a previously radiated area requiring careful monitoring of increased risk of toxicity due to overlap of previous treatment..  The special nature of the planned course of radiotherapy will require increased physician supervision and oversight to ensure patient's safety with optimal treatment outcomes.  This will require extended time and effort from me.   PLAN:  The patient will receive 9 Gy in 3 fractions followed by as much as 54 Gy in 27 fractions if disease seems confined to the chest for durable local control  ________________________________  Artist Pais. Kathrynn Running, M.D.

## 2023-01-18 NOTE — Progress Notes (Signed)
No acute issues over the course of the day.  Family updated on plan of care Went to radiation oncology today for mapping w/ plan to  receive 9 Gy in 3 fractions followed by as much as 54 Gy in 27 fractions   Remains sedated on vent  Plan Cont full vent support. Weaning PEEP down to 8; cont to wean FIO2 XRT per rad/onc Complete 3 more days of zosyn PAD protocol RASS goal -2 Am CXR w/ daily assessment for SBT Cont tubefeeds Cont heparin gtt; eventually transition back to DOAC Have am lipase and triglycerides ordered. May need to consider alternative sedating regimen IF triglycerides continue to rise.   Simonne Martinet ACNP-BC [facility] Pulmonary/Critical Care Pager # [phone] OR # [phone] if no answer

## 2023-01-18 NOTE — Progress Notes (Signed)
PT Cancellation Note  Patient Details Name: LESLIEANNE VOLNER MRN: [id number] DOB: [birth year]   Cancelled Treatment:    Reason Eval/Treat Not Completed: Patient not medically ready--currently intubated.    Faye Ramsay, PT Acute Rehabilitation  Office: [phone]

## 2023-01-18 NOTE — Progress Notes (Addendum)
NAME:  [name], MRN:  [id number], DOB:  03-31-1956, LOS: 5 ADMISSION DATE:  01/11/2023, CONSULTATION DATE:  01/16/2023 REFERRING MD: Family Practice , CHIEF COMPLAINT:  Acute Respiratory Failure in the setting of Lung Cancer, COPD and infiltrate on CXR   History of Present Illness:  [name] is a 67 y.o.female with a history of hx of COPD, recurrent adenocarcinoma of LLL, ( Biopsy was planned for 5/7) and panic/anxiety that was admitted for AHRF in setting of CAP/ COPD exacerbation  to [facility] Teaching Service at [facility].   Presented to ED 01/12/2023 for N/V, abm pain, and diarrhea for 2 wks. Abm exam benign, transaminases wnl, and lipase wnl. CTAP and CXR did show developing PNA of RLL. Patient also tachypneic and had increased O2 requirement (Baseline 2L [place], needing up to 8L HFNC). Pulmonology was consulted. Patient also had panic attacks/anxiety that exacerbated work of breathing and vocal cord dysfunction, leading to stridor. AHRF thought to be multifactorial including CAP, COPD exacerbation, and vocal cord dysfunction. Patient started on ceftriaxone and azithromycin for CAP coverage. Patient started on DuoNebs and Solu-Medrol for COPD exacerbation. Patient was continued on formulary equivalent of home Breztri. Patient was continued on home Celexa and Atarax, and mirtazapine was added to better control anxiety. Patient completed 3-day course of azithromycin and ceftriaxone was transitioned to cefdinir to finish 5-day total course.  Pt. Was scheduled for Bronch with biopsies 01/17/2023 to further work up her suspected recurrence of lung cancer. Obviously this will be delayed  Pt. Required transfer from floor to Progressive unit  for BiPAP on 5/5 for increased work of breathing. She has decompensated overnight and this am  (01/15/2022)  work of breathing  has increased. Pt is a DNR, and she endorsed that she was not ready to die and wanted to be intubated. (See Prior Note documentation 01/16/2023)   On  arrival to ICU 5/6 patient intubated.    Pertinent  Medical History   Past Medical History:  Diagnosis Date   Abnormal CT lung screening 07/10/2020   Anxiety    Anxiety state 02/28/2012   GAD7 : 6  PHQ9 : 6   Arthritis    Bladder cancer (HCC) 09/2020   urologist--- dr Berneice Heinrich, incidental finding on PET scan 12/ 2021,  s/p TURBT 09-18-2020 high grade papillary urothelial    Cancer related pain 03/11/2022   CAP (community acquired pneumonia) 04/20/2022   Centrilobular emphysema (HCC)    Chronic left shoulder pain 04/29/2021   Close exposure to COVID-19 virus 05/03/2019   Constipation 04/09/2019   Costochondritis, acute 08/14/2020   DDD (degenerative disc disease), lumbar    Depression    DOE (dyspnea on exertion)    10-29-2020  per pt can do house work without sob, when walks/ stairs stops frequently  (stated recovers quickly when stops)   Emphysema lung (HCC)    Epidermoid cyst 10/29/2018   Recurrent. Located on left hip at iliac crest 2" anterior to midaxillary line. Removed 2013, 2020, 2022.    Epigastric pain 11/16/2020   Fibroid 2003   GERD (gastroesophageal reflux disease)    History of radiation therapy    Left lung- 10/06/20-10/16/20- Dr. Antony Blackbird   Leg cramping 11/13/2019   Lumbar spine pain 01/09/2015   Menopausal vaginal dryness 11/13/2019   Multiple closed fractures of ribs of left side 08/20/2022   Nocturnal enuresis 06/23/2020   Chronic overnight "leaking" of unknown source. Likely urine, but reports no color or odor. Volume enough for liners  or light pads. Occurs every night.    Primary adenocarcinoma of lower lobe of left lung (HCC) 07/2020   oncologist--- dr Arbutus Ped---  dx 11/ 2021 bilateral lower lobe masses,  s/p bronchoscopy w/ bx's 07-28-2020 malignancy cells on left ;  completed SBRT bilateral lower lung 10-16-2020 5 fractions   Radiation-induced dermatitis    on 10-29-2020 pt complaint stomach and chest areas,  completed SBRT 10-16-2020   Seasonal  allergies    Stage 3 severe COPD by GOLD classification [facility])    pulmonologist--- dr Tonia Brooms,  using anoro inhaler daily   Wears glasses      Significant Hospital Events: Including procedures, antibiotic start and stop dates in addition to other pertinent events   01/16/2023 Transferred to ICU for acute Respiratory Failure , intubated  5/7 bronch by Icard. significant extrinsic compression of the airways in the right lower lobe and middle lobe. This is likely malignant obstruction. Bx sent. Transferred to [facility] for rad/onc eval. PICC placed 5/8 stable still high peep/fio2 needs. Rad/onc consult requested   Interim History / Subjective:  Sedated on vent   Objective   Blood pressure 101/65, pulse 61, temperature 97.6 F (36.4 C), temperature source Axillary, resp. rate 14, height 5\' 8"  (1.727 m), weight 76.1 kg, SpO2 100 %.    Vent Mode: PRVC FiO2 (%):  [50 %-100 %] 80 % Set Rate:  [14 bmp-18 bmp] 14 bmp Vt Set:  [510 mL] 510 mL PEEP:  [5 cmH20-10 cmH20] 10 cmH20 Plateau Pressure:  [18 cmH20-21 cmH20] 21 cmH20   Intake/Output Summary (Last 24 hours) at 01/17/2023 0928 Last data filed at 01/17/2023 0800 Gross per 24 hour  Intake 2530.28 ml  Output 575 ml  Net 1955.28 ml   Filed Weights   01/11/23 1658 01/17/23 0500  Weight: 79.2 kg 76.1 kg    Examination: General 67 year old female sedated on vent  HENT NCAT no JVD  Pulm equal chest rise. Dec right base. No accessory muscle Card rrr Abd soft Ext warm and dry  Neuro sedated GU cl yellow   Resolved Hospital Problem list     Assessment & Plan:   Acute Respiratory Failure in setting of COPD, recurrent lung cancer w/  post obstructive atelectasis +/- post obstructive PNA stable effusion S/p bronch 5/7 has sig extrinsic compression involving the RML and RLL Plan Cont full vent support Wean PEEP/FIO2  PAD protocol RASS goal -1 VAP bundle Day 8 abx, day 3 zosyn, micro data neg to date. Will cont to f/u pending cultures. If neg  would dc zosyn after 2 more days Will ask Rad/onc to see hoping perhaps radiation would help recruit lung Continue Yupelri, Pulmicort and Brovana   Hypotension, Drug induced from sedation  Plan Keep euvolemic Dc lopressor Titrate NE for MAP > 65 Add midodrine   Elevated triglycerides Plan Ck lipase and repeat in am  May need to stop prop    New Atrial Fibrillation  -Last Epixiban was 5/4 as prep for her bronch which was scheduled for 5/7 Plan Resume IV heparin for now  Tele   At risk for malnutrition  Plan Tubefeeds      Best Practice (right click and "Reselect all SmartList Selections" daily)   Diet/type: tubefeeds DVT prophylaxis: heparin gtt  GI prophylaxis: H2B Lines: N/A Foley:  Requiring intermittent I&O. If continues to needs will place Foley cath  Code Status:  full code Last date of multidisciplinary goals of care discussion [01/16/2023 with code status change]  My time 45 min  Simonne Martinet ACNP-BC [facility] Pulmonary/Critical Care Pager # [phone] OR # [phone] if no answer

## 2023-01-18 NOTE — Progress Notes (Signed)
ANTICOAGULATION CONSULT NOTE  Pharmacy Consult for IV Heparin Indication: atrial fibrillation  Allergies  Allergen Reactions   Codeine Nausea And Vomiting and Other (See Comments)    Hallucinations   Prednisone Other (See Comments)    'Made me feel funny inside my head"-pt cannot be specific    Patient Measurements: Height: 5\' 8"  (172.7 cm) Weight: 78.3 kg (172 lb 9.9 oz) IBW/kg (Calculated) : 63.9 Heparin Dosing Weight: 79.2 kg  Vital Signs: Temp: 99.7 F (37.6 C) (05/08 0000) Temp Source: Axillary (05/08 0000) BP: 98/62 (05/08 0645) Pulse Rate: 101 (05/08 0645)  Labs: Recent Labs    01/16/23 0117 01/16/23 1202 01/16/23 2051 01/17/23 0343 01/17/23 0708 01/18/23 0141  HGB 13.3   < >  --  11.9* 11.8* 11.2*  HCT 41.8   < >  --  35.0* 36.9 34.8*  PLT 434*  --   --   --  413* 376  APTT  --   --  89*  --   --   --   HEPARINUNFRC  --   --  >1.10*  --   --   --   CREATININE 0.93  --   --   --  1.02* 1.05*   < > = values in this interval not displayed.     Estimated Creatinine Clearance: 57.2 mL/min (A) (by C-G formula based on SCr of 1.05 mg/dL (H)).   Assessment: 67 years of age female transferred to ICU for respiratory distress and intubated. Patient was recently started on Apixaban for new atrial fibrillation which was held 5/4 in anticipation of diagnostic bronchoscopy. Pharmacy consulted to resume anticoagulation with IV heparin on 5/6.   01/18/2023 Heparin was held 5/7 for bronch Per CCM heparin to resume now with no bolus Hg 11.2; PLT WNL, SCr 1.05 No bleeding reported   Goal of Therapy:  Heparin level 0.3-0.7 units/ml aPTT 66-102 seconds Monitor platelets by anticoagulation protocol: Yes   Plan:  resume IV heparin at 1200 units/hr with no bolus Draw aPTT & heparin level 8 hrs after started Daily CBC, heparin level & aPTT  Herby Abraham, Pharm.D Use secure chat for questions 01/18/2023 8:12 AM

## 2023-01-18 NOTE — Progress Notes (Signed)
OT Cancellation Note  Patient Details Name: [name] MRN: [id number] DOB: 07/27/1956   Cancelled Treatment:    Reason Eval/Treat Not Completed: Patient not medically ready. Pt transferred from [facility] yesterday and is currently intubated and sedated on full vent support.  Lindon Romp OT Acute Rehabilitation Services Office [phone]    Evette Georges 01/18/2023, 8:17 AM

## 2023-01-18 NOTE — Progress Notes (Signed)
PCP note. Chart indicates [name] has been transferred to [facility] for radiation.   Called daughter [name] at [phone] to discuss and offer support.   Fayette Pho, MD

## 2023-01-18 NOTE — Progress Notes (Deleted)
  SUBJECTIVE:   CHIEF COMPLAINT / HPI:   Hospital f/u Admitted 01/12/23 for AHRF -Thought to be COPD vs CAP and possible recurrence of lung cancer -New onset AFIB > Eliquis  PERTINENT  PMH / PSH: ***  Past Medical History:  Diagnosis Date   Abnormal CT lung screening 07/10/2020   Anxiety    Anxiety state 02/28/2012   GAD7 : 6  PHQ9 : 6   Arthritis    Bladder cancer (HCC) 09/2020   urologist--- dr Berneice Heinrich, incidental finding on PET scan 12/ 2021,  s/p TURBT 09-18-2020 high grade papillary urothelial    Cancer related pain 03/11/2022   CAP (community acquired pneumonia) 04/20/2022   Centrilobular emphysema (HCC)    Chronic left shoulder pain 04/29/2021   Close exposure to COVID-19 virus 05/03/2019   Constipation 04/09/2019   Costochondritis, acute 08/14/2020   DDD (degenerative disc disease), lumbar    Depression    DOE (dyspnea on exertion)    10-29-2020  per pt can do house work without sob, when walks/ stairs stops frequently  (stated recovers quickly when stops)   Emphysema lung (HCC)    Epidermoid cyst 10/29/2018   Recurrent. Located on left hip at iliac crest 2" anterior to midaxillary line. Removed 2013, 2020, 2022.    Epigastric pain 11/16/2020   Fibroid 2003   GERD (gastroesophageal reflux disease)    History of radiation therapy    Left lung- 10/06/20-10/16/20- Dr. Antony Blackbird   Leg cramping 11/13/2019   Lumbar spine pain 01/09/2015   Menopausal vaginal dryness 11/13/2019   Multiple closed fractures of ribs of left side 08/20/2022   Nocturnal enuresis 06/23/2020   Chronic overnight "leaking" of unknown source. Likely urine, but reports no color or odor. Volume enough for liners or light pads. Occurs every night.    Primary adenocarcinoma of lower lobe of left lung [facility] 07/2020   oncologist--- dr Arbutus Ped---  dx 11/ 2021 bilateral lower lobe masses,  s/p bronchoscopy w/ bx's 07-28-2020 malignancy cells on left ;  completed SBRT bilateral lower lung 10-16-2020 5  fractions   Radiation-induced dermatitis    on 10-29-2020 pt complaint stomach and chest areas,  completed SBRT 10-16-2020   Seasonal allergies    Stage 3 severe COPD by GOLD classification [facility])    pulmonologist--- dr Tonia Brooms,  using anoro inhaler daily   Wears glasses     Patient Care Team: Fayette Pho, MD as PCP - General (Family Medicine) Mardella Layman, MD as Consulting Physician (Gastroenterology) Genia Del Daisy Blossom, MD as Consulting Physician (Ophthalmology) OBJECTIVE:  There were no vitals taken for this visit. Physical Exam   ASSESSMENT/PLAN:  There are no diagnoses linked to this encounter. No follow-ups on file. Bess Kinds, MD 01/18/2023, 7:29 AM PGY-***, [facility] Family Medicine {    This will disappear when note is signed, click to select method of visit    :1}

## 2023-01-18 NOTE — Anesthesia Postprocedure Evaluation (Signed)
Anesthesia Post Note  Patient: [name]  Procedure(s) Performed: VIDEO BRONCHOSCOPY WITHOUT FLUORO BRONCHIAL NEEDLE ASPIRATION BIOPSIES BRONCHIAL BIOPSIES     Patient location during evaluation: SICU Anesthesia Type: General Level of consciousness: sedated Pain management: pain level controlled Vital Signs Assessment: post-procedure vital signs reviewed and stable Respiratory status: patient remains intubated per anesthesia plan Cardiovascular status: stable Postop Assessment: no apparent nausea or vomiting Anesthetic complications: no   No notable events documented.                Sheriden Archibeque DANIEL

## 2023-01-18 NOTE — Progress Notes (Signed)
Pharmacy: Re- heparin  Patient is a 67 y.o F with hx bladder and lung cancer who presented to the ED on 01/11/23 with c/o poor oral intake, n/v and SOB. She was subsequently found to have postobstructive PNA and new onset afib with RVR. She was started in Eliquis for afib on 01/12/23, this was d/ced on 01/15/23 and anticoag. transitioned to heparin drip on 01/16/23 for bronch with biopsies of right hilar mass on 01/17/23.  Heparin resumed back post procedure on 01/18/23 AM.   - heparin level collected at 8p is 0.49  and aPTT is therapeutic at 69 secs - Of note, elevated Triglyceride (>360) level can falsely increase heparin level.  Goal of Therapy:  Heparin level 0.3-0.7 units/ml aPTT 66-102 seconds Monitor platelets by anticoagulation protocol: Yes  Plan: - continue heparin drip at 1200 units/hr - f/u with AM labs and adjust if needed  - monitor for s/sx bleeding  Dorna Leitz, PharmD, BCPS 01/18/2023 9:10 PM

## 2023-01-19 ENCOUNTER — Ambulatory Visit
Admission: RE | Admit: 2023-01-19 | Discharge: 2023-01-19 | Disposition: A | Discharge status: Home / Self Care | Payer: 59 | Source: Ambulatory Visit | Attending: Radiation Oncology | Admitting: Radiation Oncology

## 2023-01-19 ENCOUNTER — Inpatient Hospital Stay (HOSPITAL_COMMUNITY): Payer: 59

## 2023-01-19 ENCOUNTER — Other Ambulatory Visit: Payer: Self-pay

## 2023-01-19 ENCOUNTER — Ambulatory Visit: Payer: 59

## 2023-01-19 DIAGNOSIS — Z87891 Personal history of nicotine dependence: Secondary | ICD-10-CM | POA: Diagnosis not present

## 2023-01-19 DIAGNOSIS — Z9911 Dependence on respirator [ventilator] status: Secondary | ICD-10-CM

## 2023-01-19 DIAGNOSIS — Z510 Encounter for antineoplastic radiation therapy: Secondary | ICD-10-CM | POA: Diagnosis not present

## 2023-01-19 DIAGNOSIS — C3432 Malignant neoplasm of lower lobe, left bronchus or lung: Secondary | ICD-10-CM | POA: Diagnosis not present

## 2023-01-19 DIAGNOSIS — J9601 Acute respiratory failure with hypoxia: Secondary | ICD-10-CM | POA: Diagnosis not present

## 2023-01-19 LAB — GLUCOSE, CAPILLARY
Glucose-Capillary: 103 mg/dL — ABNORMAL HIGH (ref 70–99)
Glucose-Capillary: 110 mg/dL — ABNORMAL HIGH (ref 70–99)
Glucose-Capillary: 169 mg/dL — ABNORMAL HIGH (ref 70–99)
Glucose-Capillary: 129 mg/dL — ABNORMAL HIGH (ref 70–99)
Glucose-Capillary: 173 mg/dL — ABNORMAL HIGH (ref 70–99)

## 2023-01-19 LAB — RAD ONC ARIA SESSION SUMMARY
Course Elapsed Days: 1
Plan Fractions Treated to Date: 2
Plan Prescribed Dose Per Fraction: 3 Gy
Plan Total Fractions Prescribed: 3
Plan Total Prescribed Dose: 9 Gy
Reference Point Dosage Given to Date: 6 Gy
Reference Point Session Dosage Given: 3 Gy
Session Number: 2

## 2023-01-19 LAB — BASIC METABOLIC PANEL
Anion gap: 7 (ref 5–15)
BUN: 21 mg/dL (ref 8–23)
CO2: 29 mmol/L (ref 22–32)
Calcium: 7.5 mg/dL — ABNORMAL LOW (ref 8.9–10.3)
Chloride: 97 mmol/L — ABNORMAL LOW (ref 98–111)
Creatinine, Ser: 0.85 mg/dL (ref 0.44–1.00)
GFR, Estimated: >60 mL/min (ref >60)
Glucose, Bld: 196 mg/dL — ABNORMAL HIGH (ref 70–99)
Potassium: 3.8 mmol/L (ref 3.5–5.1)
Sodium: 133 mmol/L — ABNORMAL LOW (ref 135–145)

## 2023-01-19 LAB — CBC
HCT: 30.9 % — ABNORMAL LOW (ref 36.0–46.0)
Hemoglobin: 9.9 g/dL — ABNORMAL LOW (ref 12.0–15.0)
MCH: 28.4 pg (ref 26.0–34.0)
MCHC: 32 g/dL (ref 30.0–36.0)
MCV: 88.8 fL (ref 80.0–100.0)
Platelets: 314 10*3/uL (ref 150–400)
RBC: 3.48 MIL/uL — ABNORMAL LOW (ref 3.87–5.11)
RDW: 15.4 % (ref 11.5–15.5)
WBC: 14.4 10*3/uL — ABNORMAL HIGH (ref 4.0–10.5)
nRBC: 0 % (ref 0.0–0.2)

## 2023-01-19 LAB — CULTURE, RESPIRATORY W GRAM STAIN

## 2023-01-19 LAB — LIPASE, BLOOD: Lipase: 29 U/L (ref 11–51)

## 2023-01-19 LAB — TRIGLYCERIDES: Triglycerides: 963 mg/dL — ABNORMAL HIGH (ref <150)

## 2023-01-19 LAB — HEPARIN LEVEL (UNFRACTIONATED): Heparin Unfractionated: 0.72 IU/mL — ABNORMAL HIGH (ref 0.30–0.70)

## 2023-01-19 LAB — APTT
aPTT: 101 seconds — ABNORMAL HIGH (ref 24–36)
aPTT: 127 seconds — ABNORMAL HIGH (ref 24–36)

## 2023-01-19 LAB — SURGICAL PATHOLOGY

## 2023-01-19 MED ORDER — DEXMEDETOMIDINE HCL IN NACL 200 MCG/50ML IV SOLN
0.0000 ug/kg/h | INTRAVENOUS | Status: DC
  Administered 2023-01-19: 0.6 ug/kg/h via INTRAVENOUS
  Administered 2023-01-19 (×2): 0.5 ug/kg/h via INTRAVENOUS
  Administered 2023-01-19: 0.4 ug/kg/h via INTRAVENOUS
  Administered 2023-01-20: 0.5 ug/kg/h via INTRAVENOUS
  Administered 2023-01-20: 0.8 ug/kg/h via INTRAVENOUS
  Filled 2023-01-19 (×7): qty 50

## 2023-01-19 MED ORDER — BETHANECHOL CHLORIDE 10 MG PO TABS
5.0000 mg | ORAL_TABLET | Freq: Three times a day (TID) | ORAL | Status: DC
  Administered 2023-01-19 – 2023-01-25 (×19): 5 mg
  Filled 2023-01-19 (×19): qty 1

## 2023-01-19 MED ORDER — FUROSEMIDE 10 MG/ML IJ SOLN
40.0000 mg | Freq: Once | INTRAMUSCULAR | Status: AC
  Administered 2023-01-19: 40 mg via INTRAVENOUS
  Filled 2023-01-19: qty 4

## 2023-01-19 MED ORDER — POTASSIUM CHLORIDE 20 MEQ PO PACK
40.0000 meq | PACK | Freq: Once | ORAL | Status: AC
  Administered 2023-01-19: 40 meq
  Filled 2023-01-19: qty 2

## 2023-01-19 NOTE — Progress Notes (Signed)
NAME:  [name], MRN:  [id number], DOB:  24-Sep-1955, LOS: 7 ADMISSION DATE:  01/11/2023, CONSULTATION DATE:  01/16/2023 REFERRING MD: Family Practice , CHIEF COMPLAINT:  Acute Respiratory Failure in the setting of Lung Cancer, COPD and infiltrate on CXR   History of Present Illness:  [name] is a 67 y.o.female with a history of hx of COPD, recurrent adenocarcinoma of LLL, ( Biopsy was planned for 5/7) and panic/anxiety that was admitted for AHRF in setting of CAP/ COPD exacerbation  to [facility] Teaching Service at [facility].   Presented to ED 01/12/2023 for N/V, abm pain, and diarrhea for 2 wks. Abm exam benign, transaminases wnl, and lipase wnl. CTAP and CXR did show developing PNA of RLL. Patient also tachypneic and had increased O2 requirement (Baseline 2L [place], needing up to 8L HFNC). Pulmonology was consulted. Patient also had panic attacks/anxiety that exacerbated work of breathing and vocal cord dysfunction, leading to stridor. AHRF thought to be multifactorial including CAP, COPD exacerbation, and vocal cord dysfunction. Patient started on ceftriaxone and azithromycin for CAP coverage. Patient started on DuoNebs and Solu-Medrol for COPD exacerbation. Patient was continued on formulary equivalent of home Breztri. Patient was continued on home Celexa and Atarax, and mirtazapine was added to better control anxiety. Patient completed 3-day course of azithromycin and ceftriaxone was transitioned to cefdinir to finish 5-day total course.  Pt. Was scheduled for Bronch with biopsies 01/17/2023 to further work up her suspected recurrence of lung cancer. Obviously this will be delayed  Pt. Required transfer from floor to Progressive unit  for BiPAP on 5/5 for increased work of breathing. She has decompensated overnight and this am  (01/15/2022)  work of breathing  has increased. Pt is a DNR, and she endorsed that she was not ready to die and wanted to be intubated. (See Prior Note documentation 01/16/2023)   On  arrival to ICU 5/6 patient intubated.    Pertinent  Medical History   Past Medical History:  Diagnosis Date   Abnormal CT lung screening 07/10/2020   Anxiety    Anxiety state 02/28/2012   GAD7 : 6  PHQ9 : 6   Arthritis    Bladder cancer (HCC) 09/2020   urologist--- dr Berneice Heinrich, incidental finding on PET scan 12/ 2021,  s/p TURBT 09-18-2020 high grade papillary urothelial    Cancer related pain 03/11/2022   CAP (community acquired pneumonia) 04/20/2022   Centrilobular emphysema (HCC)    Chronic left shoulder pain 04/29/2021   Close exposure to COVID-19 virus 05/03/2019   Constipation 04/09/2019   Costochondritis, acute 08/14/2020   DDD (degenerative disc disease), lumbar    Depression    DOE (dyspnea on exertion)    10-29-2020  per pt can do house work without sob, when walks/ stairs stops frequently  (stated recovers quickly when stops)   Emphysema lung (HCC)    Epidermoid cyst 10/29/2018   Recurrent. Located on left hip at iliac crest 2" anterior to midaxillary line. Removed 2013, 2020, 2022.    Epigastric pain 11/16/2020   Fibroid 2003   GERD (gastroesophageal reflux disease)    History of radiation therapy    Left lung- 10/06/20-10/16/20- Dr. Antony Blackbird   Leg cramping 11/13/2019   Lumbar spine pain 01/09/2015   Menopausal vaginal dryness 11/13/2019   Multiple closed fractures of ribs of left side 08/20/2022   Nocturnal enuresis 06/23/2020   Chronic overnight "leaking" of unknown source. Likely urine, but reports no color or odor. Volume enough for liners  or light pads. Occurs every night.    Primary adenocarcinoma of lower lobe of left lung (HCC) 07/2020   oncologist--- dr Arbutus Ped---  dx 11/ 2021 bilateral lower lobe masses,  s/p bronchoscopy w/ bx's 07-28-2020 malignancy cells on left ;  completed SBRT bilateral lower lung 10-16-2020 5 fractions   Radiation-induced dermatitis    on 10-29-2020 pt complaint stomach and chest areas,  completed SBRT 10-16-2020   Seasonal  allergies    Stage 3 severe COPD by GOLD classification [facility])    pulmonologist--- dr Tonia Brooms,  using anoro inhaler daily   Wears glasses      Significant Hospital Events: Including procedures, antibiotic start and stop dates in addition to other pertinent events   01/16/2023 Transferred to ICU for acute Respiratory Failure , intubated  5/7 bronch by Icard. significant extrinsic compression of the airways in the right lower lobe and middle lobe. This is likely malignant obstruction. Bx sent. Transferred to [facility] for rad/onc eval. PICC placed 5/8 stable still high peep/fio2 needs. Rad/onc consult requested   Interim History / Subjective:  Overnight no acute events.  Objective   Blood pressure (!) 115/58, pulse 99, temperature 98.4 F (36.9 C), temperature source Axillary, resp. rate 14, height 5\' 8"  (1.727 m), weight 78.3 kg, SpO2 95 %.    Vent Mode: PRVC FiO2 (%):  [30 %-40 %] 40 % Set Rate:  [14 bmp] 14 bmp Vt Set:  [510 mL] 510 mL PEEP:  [8 cmH20-10 cmH20] 8 cmH20 Plateau Pressure:  [18 cmH20-20 cmH20] 18 cmH20   Intake/Output Summary (Last 24 hours) at 01/19/2023 0818 Last data filed at 01/19/2023 0700 Gross per 24 hour  Intake 2486.57 ml  Output 1088 ml  Net 1398.57 ml    Filed Weights   01/17/23 0500 01/17/23 1800 01/18/23 0500  Weight: 76.1 kg 78.3 kg 78.3 kg    Examination: General critically ill appearing woman lying in bed intubated, sedated HENT [place]/AT, eyes anicteric, ETT  Pulm R basilar rhales improved, CTA anteriorly, synchronous with MV Card S1S2, RRR Abd soft, NT Ext no c/c/e Neuro RASS -5 Derm: warm, dry  Na+  133 BUN 21 Cr 0.85 WBC 14.4 H/H 9.9/30.9  TG 963  Resolved Hospital Problem list     Assessment & Plan:   Acute respiratory failure in setting of COPD, recurrent lung cancer w/  post obstructive atelectasis +/- post obstructive PNA stable effusion -con't antibiotics -LTVV -VAP prevention protocol -PAD protocol for sedation -radiation  oncology to help with extrinsic compression> radiation today -con't bronchodilators  H/o VCD -will need monitoring after extubation  Hypotension, Drug induced from sedation  -midodrine, con't NE to maintain MAP>65  Paroxysmal atrial Fibrillation  -heparin; can resume apixaban after extubation when improving -holding Bblockers currently  At risk for malnutrition  -TF  Hypertriglyceridemia due to propofol -switch propofol to precedex + PRN versed  Hyperglycemia -SSI PRN -goal BG 140-180  Hyponatremia -avoid hypotonic fluids  Anemia -transfuse for Hb <7 or hemodynamically significant bleeding -monitor  Urinary retention likely 2/2 sedation -foley; add bethanechol  Best Practice (right click and "Reselect all SmartList Selections" daily)   Diet/type: tubefeeds DVT prophylaxis: heparin gtt  GI prophylaxis: H2B Lines: Central line- picc Foley:  yes Code Status:  full code Last date of multidisciplinary goals of care discussion [01/16/2023 with code status change]  This patient is critically ill with multiple organ system failure which requires frequent high complexity decision making, assessment, support, evaluation, and titration of therapies. This was completed through the  application of advanced monitoring technologies and extensive interpretation of multiple databases. During this encounter critical care time was devoted to patient care services described in this note for 40 minutes.  Steffanie Dunn, DO 01/19/23 8:48 AM [place] [facility]  For contact information, see Amion. If no response to pager, please call PCCM consult pager. After hours, 7PM- 7AM, please call Elink.

## 2023-01-19 NOTE — Progress Notes (Addendum)
ANTICOAGULATION CONSULT NOTE  Pharmacy Consult for IV Heparin Indication: atrial fibrillation  Allergies  Allergen Reactions   Codeine Nausea And Vomiting and Other (See Comments)    Hallucinations   Prednisone Other (See Comments)    'Made me feel funny inside my head"-pt cannot be specific    Patient Measurements: Height: 5\' 8"  (172.7 cm) Weight: 78.3 kg (172 lb 9.9 oz) IBW/kg (Calculated) : 63.9 Heparin Dosing Weight: TBW  Vital Signs: Temp: 98.4 F (36.9 C) (05/09 1600) Temp Source: Axillary (05/09 1600) BP: 100/50 (05/09 1800) Pulse Rate: 73 (05/09 1800)  Labs: Recent Labs    01/16/23 2051 01/17/23 0343 01/17/23 0708 01/18/23 0141 01/18/23 2010 01/19/23 0550 01/19/23 1641  HGB  --    < > 11.8* 11.2*  --  9.9*  --   HCT  --    < > 36.9 34.8*  --  30.9*  --   PLT  --   --  413* 376  --  314  --   APTT 89*  --   --   --  69* 101* 127*  HEPARINUNFRC >1.10*  --   --   --  0.49 0.72*  --   CREATININE  --   --  1.02* 1.05*  --  0.85  --    < > = values in this interval not displayed.     Estimated Creatinine Clearance: 70.7 mL/min (by C-G formula based on SCr of 0.85 mg/dL).   Assessment: 67 y/o F transferred to ICU for respiratory distress and intubated. Patient was recently started on Apixaban for new atrial fibrillation which was held 5/4 in anticipation of diagnostic bronchoscopy. Pharmacy consulted to resume anticoagulation with IV heparin on 5/6.   01/19/2023 PM aPTT = 127 seconds, supratherapeutic despite decrease in rate earlier today to 1100 units/hr Currently using aPTT levels for heparin monitoring while Triglycerides elevated. (Elevated Triglycerides >360 can falsely increase heparin level.) CBC: Hgb decreased to 9.9, Plt WNL Heparin infusing in left peripheral IV; aPTT collected from PICC line on right. Confirmed with RN that heparin running at specified rate. No bleeding or infusion related concerns reported by nursing   Goal of Therapy:  Heparin  level 0.3-0.7 units/ml aPTT 66-102 seconds Monitor platelets by anticoagulation protocol: Yes   Plan:  Decrease IV heparin rate to 950 units/hr  Recheck aPTT and heparin level 6 hours after rate change Daily CBC, heparin level, aPTT Monitor closely for s/sx of bleeding   Greer Pickerel, PharmD, BCPS Clinical Pharmacist 01/19/2023 6:08 PM

## 2023-01-19 NOTE — Progress Notes (Signed)
PT Cancellation Note  Patient Details Name: [name] MRN: [id number] DOB: [birth year]   Cancelled Treatment:    Reason Eval/Treat Not Completed: Patient not medically ready, remains on vent. Will follow. Blanchard Kelch PT Acute Rehabilitation Services Office [phone] Weekend [phone]    Rada Hay 01/19/2023, 12:00 PM

## 2023-01-19 NOTE — IPAL (Signed)
  Interdisciplinary Goals of Care Family Meeting   Date carried out:: 01/19/2023  Location of the meeting: Bedside  Member's involved: Physician, Bedside Registered Nurse, and Family Member or next of kin  Durable Power of Attorney or acting medical decision maker: daughter [name]    Discussion: We discussed goals of care for [employer] .  We discussed that we are hopeful to be able to extubate in the next few days but that it is hard to predict what her clinical course will be after extubation since she got worse before while on steroids and antibiotics. She will have received part of her radiation but has several weeks of radiation planned. She had VCD PTA but is at risk for post-extubation stridor, which would be more worrisome than stridor from VCD but may be hard to fully distinguish. The goal would be to get her off the vent as soon as reasonably possible to rehab her to open up any chances she has to be a candidate for chemo. If she required reintubation and more prolonged intubation, it will be a longer road to recovery to the point she would be strong enough to get chemo. She will talk to her sister about what they think would be a timeline where they do not think she would want to continue being on the vent and if she would want to reintubate if she fails extubation. She feels that her mother is very uncomfortable when she is awake at all on the vent and wants to avoid causing suffering.  We will follow up tomorrow.     Code status: Full Code  Disposition: Continue current acute care   Time spent for the meeting: 15 min.  Steffanie Dunn 01/19/2023, 3:57 PM

## 2023-01-19 NOTE — Procedures (Addendum)
Procedures Transported on vent to Radiation treatment and back to ICU 1233, on LVT vent: full support VC, VT 510, RR 14, 100%, 8+, airway secure, ETT stable @ 25 @ the lip, ambu bag present, extra full E tank, Sp02 96%, pt stable.

## 2023-01-19 NOTE — Progress Notes (Signed)
Nutrition Follow-up  DOCUMENTATION CODES:   Severe malnutrition in context of acute illness/injury  INTERVENTION:  Continue Osmolite 1.5 @ 55 ml/hr (1320 ml/day) PROSource TF20 60 ml daily -Tube feeding regimen at goal rate provides 2060 kcal, 103 grams of protein, and 1006 ml of H2O.  - Thiamine 100 mg daily per tube x 5 days due to refeeding risk  - MVI with minerals daily per tube  NUTRITION DIAGNOSIS:   Severe Malnutrition related to acute illness as evidenced by moderate fat depletion, moderate muscle depletion.  Ongoing, being addressed via initiation of tube feeds  GOAL:   Patient will meet greater than or equal to 90% of their needs  Met via tube feeds at goal rate  MONITOR:   Vent status, Labs, Weight trends, TF tolerance  REASON FOR ASSESSMENT:   Ventilator, Consult Enteral/tube feeding initiation and management  ASSESSMENT:   67 y.o. female with PMHx including adenocarcinoma of LLL now with recurrence, COPD, and anxiety presents with acute on chronic hypoxemic respiratory failure likely multifactorial  Labs: Na 133, Glu 196, TG 963 Meds: TUMs, colace, pepcid, insulin, remeron, MVI, Zosyn, miralax, NS, thiamine  Drips: Precedex, Heparin, Levophed-stopped, Fentanyl  Wt: admit wt 168#; CBW 173# I/O's: +6 L  Patient is currently intubated on ventilator support Temp (24hrs), Avg:98.2 F (36.8 C), Min:97.5 F (36.4 C), Max:98.8 F (37.1 C)  Visited patient at bedside who is sedated on vent with precedex. Patient changed from propofol. TG 963.   RD briefly spoke with family member at bedside who asked about how much nutrition she was receiving via tube feeds and RD informed her that patient is tolerating TF's at goal rate and all nutrition needs being met  Diet Order:   Diet Order             Diet NPO time specified  Diet effective now                   EDUCATION NEEDS:   Education needs have been addressed  Skin:  Skin Assessment:  Reviewed RN Assessment  Last BM:  5/9  Height:   Ht Readings from Last 1 Encounters:  01/17/23 5\' 8"  (1.727 m)    Weight:   Wt Readings from Last 1 Encounters:  01/19/23 78.3 kg    BMI:  Body mass index is 26.25 kg/m.  Estimated Nutritional Needs:   Kcal:  1900-2100  Protein:  95-120 g  Fluid:  >1.9 L   Leodis Rains, RDN, LDN  Clinical Nutrition

## 2023-01-19 NOTE — Progress Notes (Signed)
ANTICOAGULATION CONSULT NOTE  Pharmacy Consult for IV Heparin Indication: atrial fibrillation  Allergies  Allergen Reactions   Codeine Nausea And Vomiting and Other (See Comments)    Hallucinations   Prednisone Other (See Comments)    'Made me feel funny inside my head"-pt cannot be specific    Patient Measurements: Height: 5\' 8"  (172.7 cm) Weight: 78.3 kg (172 lb 9.9 oz) IBW/kg (Calculated) : 63.9 Heparin Dosing Weight: 79.2 kg  Vital Signs: Temp: 98.4 F (36.9 C) (05/09 0000) Temp Source: Axillary (05/09 0000) BP: 117/55 (05/08 2320) Pulse Rate: 102 (05/08 2320)  Labs: Recent Labs    01/16/23 2051 01/17/23 0343 01/17/23 0708 01/18/23 0141 01/18/23 2010 01/19/23 0550  HGB  --    < > 11.8* 11.2*  --  9.9*  HCT  --    < > 36.9 34.8*  --  30.9*  PLT  --   --  413* 376  --  314  APTT 89*  --   --   --  69*  --   HEPARINUNFRC >1.10*  --   --   --  0.49 0.72*  CREATININE  --   --  1.02* 1.05*  --  0.85   < > = values in this interval not displayed.     Estimated Creatinine Clearance: 70.7 mL/min (by C-G formula based on SCr of 0.85 mg/dL).   Assessment: 67 years of age female transferred to ICU for respiratory distress and intubated. Patient was recently started on Apixaban for new atrial fibrillation which was held 5/4 in anticipation of diagnostic bronchoscopy. Pharmacy consulted to resume anticoagulation with IV heparin on 5/6.   01/19/2023 APTT = 101 sec; therapeutic on IV heparin 1200 units/hr Heparin level 0.72- slightly elevated however heparin level may be falsely elevated in setting of TGs>360 (TGs=963).  Will use aptt levels for heparin monitoring while TGs elevated.  CBC: Hg 9.9-slightly low; PLT WNL No bleeding or infusion related concerns reported by RN Heparin infusing peripherally; lab collected from   Goal of Therapy:  Heparin level 0.3-0.7 units/ml aPTT 66-102 seconds Monitor platelets by anticoagulation protocol: Yes   Plan:  Decrease IV  heparin slightly to 1100 units/hr  Recheck aPTT 8 hrs after rate change Daily CBC, heparin level & aPTT  Junita Push, PharmD, BCPS 01/19/2023 6:47 AM

## 2023-01-19 NOTE — Progress Notes (Signed)
Attempted to call daughter; left a message for her to call back.  Steffanie Dunn, DO 01/19/23 12:58 PM [facility]  For contact information, see Amion. If no response to pager, please call PCCM consult pager. After hours, 7PM- 7AM, please call Elink.

## 2023-01-19 NOTE — Progress Notes (Signed)
   01/19/23 1353  Vent Select  Invasive or Noninvasive Invasive  Adult Vent Y  Airway 8.5 mm  Placement Date/Time: 01/17/23 (c) 1520   Grade View: Grade 1  Airway Device: Endotracheal Tube  Laryngoscope Blade: (c)   ETT Types: Oral  Size (mm): 8.5 mm  Cuffed: Cuffed;Min.occ.pres.  Insertion attempts: 1  Airway Equipment: Bougie Stylet  Placeme...  Secured at (cm) 25 cm  Measured From Lips  Secured Location Left  Secured By Publishing rights manager  Prone position No  Head position Left  Site Condition Cool;Drainage (Comment)  Adult Ventilator Settings  Vent Type Servo i  Humidity HME  Vent Mode (S)  PRVC (Ended vent wean due to radiology appt @ 1400. Pt weaned approximately 2.5 hrs. PSV 12/8.)  Vt Set 510 mL  Set Rate 14 bmp  FiO2 (%) 40 %  I Time 0.9 Sec(s)  PEEP 8 cmH20  Adult Ventilator Measurements  Peak Airway Pressure 19 L/min  Mean Airway Pressure 10 cmH20  Resp Rate Spontaneous 3 br/min  Resp Rate Total 17 br/min  Exhaled Vt 489 mL  Measured Ve 8 L  I:E Ratio Measured 1:3.7  Total PEEP 8 cmH20  SpO2 96 %  Adult Ventilator Alarms  Alarms On Y  Ve High Alarm 18 L/min  Ve Low Alarm 6 L/min  Resp Rate High Alarm 35 br/min  Resp Rate Low Alarm 0.8  PEEP Low Alarm 6 cmH2O  Press High Alarm 40 cmH2O  T Apnea 20 sec(s)  VAP Prevention  HOB> 30 Degrees Y

## 2023-01-19 NOTE — Progress Notes (Signed)
   01/19/23 1133  Vent Select  Invasive or Noninvasive Invasive  Adult Vent Y  Adult Ventilator Settings  Vent Type Servo i  Humidity HME  Vent Mode (S)  PSV;CPAP  FiO2 (%) (S)  40 %  Pressure Support (S)  12 cmH20  PEEP (S)  8 cmH20  Adult Ventilator Measurements  Peak Airway Pressure 21 L/min  Mean Airway Pressure 11 cmH20  Resp Rate Spontaneous 17 br/min  Spont TV 549 mL  Measured Ve 8.7 L  Total PEEP 8 cmH20  SpO2 97 %  Adult Ventilator Alarms  Alarms On Y  Ve High Alarm 18 L/min  Ve Low Alarm 6 L/min  Resp Rate High Alarm 35 br/min  Resp Rate Low Alarm 12  PEEP Low Alarm 6 cmH2O  Press High Alarm 40 cmH2O  T Apnea 20 sec(s)  VAP Prevention  HOB> 30 Degrees Y   Initiated weaning trials.

## 2023-01-20 ENCOUNTER — Ambulatory Visit
Admission: RE | Admit: 2023-01-20 | Discharge: 2023-01-20 | Disposition: A | Discharge status: Home / Self Care | Payer: 59 | Source: Ambulatory Visit | Attending: Radiation Oncology | Admitting: Radiation Oncology

## 2023-01-20 ENCOUNTER — Other Ambulatory Visit: Payer: Self-pay

## 2023-01-20 ENCOUNTER — Ambulatory Visit: Payer: 59

## 2023-01-20 DIAGNOSIS — J189 Pneumonia, unspecified organism: Secondary | ICD-10-CM | POA: Diagnosis not present

## 2023-01-20 DIAGNOSIS — J9601 Acute respiratory failure with hypoxia: Secondary | ICD-10-CM | POA: Diagnosis not present

## 2023-01-20 DIAGNOSIS — Z9911 Dependence on respirator [ventilator] status: Secondary | ICD-10-CM | POA: Diagnosis not present

## 2023-01-20 LAB — RAD ONC ARIA SESSION SUMMARY
Course Elapsed Days: 2
Plan Fractions Treated to Date: 3
Plan Prescribed Dose Per Fraction: 3 Gy
Plan Total Fractions Prescribed: 3
Plan Total Prescribed Dose: 9 Gy
Reference Point Dosage Given to Date: 9 Gy
Reference Point Session Dosage Given: 3 Gy
Session Number: 3

## 2023-01-20 LAB — GLUCOSE, CAPILLARY
Glucose-Capillary: 109 mg/dL — ABNORMAL HIGH (ref 70–99)
Glucose-Capillary: 136 mg/dL — ABNORMAL HIGH (ref 70–99)
Glucose-Capillary: 143 mg/dL — ABNORMAL HIGH (ref 70–99)
Glucose-Capillary: 147 mg/dL — ABNORMAL HIGH (ref 70–99)
Glucose-Capillary: 159 mg/dL — ABNORMAL HIGH (ref 70–99)
Glucose-Capillary: 170 mg/dL — ABNORMAL HIGH (ref 70–99)
Glucose-Capillary: 171 mg/dL — ABNORMAL HIGH (ref 70–99)

## 2023-01-20 LAB — BASIC METABOLIC PANEL
Anion gap: 8 (ref 5–15)
BUN: 21 mg/dL (ref 8–23)
CO2: 29 mmol/L (ref 22–32)
Calcium: 7.6 mg/dL — ABNORMAL LOW (ref 8.9–10.3)
Chloride: 101 mmol/L (ref 98–111)
Creatinine, Ser: 0.97 mg/dL (ref 0.44–1.00)
GFR, Estimated: >60 mL/min (ref >60)
Glucose, Bld: 174 mg/dL — ABNORMAL HIGH (ref 70–99)
Potassium: 4.4 mmol/L (ref 3.5–5.1)
Sodium: 138 mmol/L (ref 135–145)

## 2023-01-20 LAB — CULTURE, RESPIRATORY W GRAM STAIN: Culture: NORMAL

## 2023-01-20 LAB — HEPARIN LEVEL (UNFRACTIONATED)
Heparin Unfractionated: 0.61 IU/mL (ref 0.30–0.70)
Heparin Unfractionated: 0.5 IU/mL (ref 0.30–0.70)

## 2023-01-20 LAB — CBC
HCT: 29.9 % — ABNORMAL LOW (ref 36.0–46.0)
Hemoglobin: 9.2 g/dL — ABNORMAL LOW (ref 12.0–15.0)
MCH: 27.2 pg (ref 26.0–34.0)
MCHC: 30.8 g/dL (ref 30.0–36.0)
MCV: 88.5 fL (ref 80.0–100.0)
Platelets: 300 10*3/uL (ref 150–400)
RBC: 3.38 MIL/uL — ABNORMAL LOW (ref 3.87–5.11)
RDW: 15.3 % (ref 11.5–15.5)
WBC: 15.9 10*3/uL — ABNORMAL HIGH (ref 4.0–10.5)
nRBC: 0 % (ref 0.0–0.2)

## 2023-01-20 LAB — TRIGLYCERIDES: Triglycerides: 78 mg/dL (ref <150)

## 2023-01-20 LAB — APTT
aPTT: 97 seconds — ABNORMAL HIGH (ref 24–36)
aPTT: 80 seconds — ABNORMAL HIGH (ref 24–36)

## 2023-01-20 MED ORDER — DEXMEDETOMIDINE HCL IN NACL 400 MCG/100ML IV SOLN
0.0000 ug/kg/h | INTRAVENOUS | Status: DC
  Administered 2023-01-20: 0.8 ug/kg/h via INTRAVENOUS
  Administered 2023-01-20: 0.9 ug/kg/h via INTRAVENOUS
  Administered 2023-01-21: 1 ug/kg/h via INTRAVENOUS
  Administered 2023-01-21: 0.8 ug/kg/h via INTRAVENOUS
  Administered 2023-01-21: 0.4 ug/kg/h via INTRAVENOUS
  Administered 2023-01-22 (×2): 1.2 ug/kg/h via INTRAVENOUS
  Administered 2023-01-22: 0.6 ug/kg/h via INTRAVENOUS
  Administered 2023-01-22: 1.2 ug/kg/h via INTRAVENOUS
  Administered 2023-01-23: 0.6 ug/kg/h via INTRAVENOUS
  Administered 2023-01-23: 1.2 ug/kg/h via INTRAVENOUS
  Administered 2023-01-23: 1 ug/kg/h via INTRAVENOUS
  Administered 2023-01-23: 1.2 ug/kg/h via INTRAVENOUS
  Administered 2023-01-24 (×5): 1 ug/kg/h via INTRAVENOUS
  Administered 2023-01-25: 1.1 ug/kg/h via INTRAVENOUS
  Administered 2023-01-25 (×2): 1 ug/kg/h via INTRAVENOUS
  Administered 2023-01-25 (×2): 0.9 ug/kg/h via INTRAVENOUS
  Administered 2023-01-26: 1.1 ug/kg/h via INTRAVENOUS
  Administered 2023-01-26: 0.7 ug/kg/h via INTRAVENOUS
  Administered 2023-01-26 – 2023-01-27 (×2): 0.9 ug/kg/h via INTRAVENOUS
  Administered 2023-01-27: 1.1 ug/kg/h via INTRAVENOUS
  Administered 2023-01-27: 1 ug/kg/h via INTRAVENOUS
  Administered 2023-01-27: 0.9 ug/kg/h via INTRAVENOUS
  Administered 2023-01-27: 1 ug/kg/h via INTRAVENOUS
  Administered 2023-01-28: 0.9 ug/kg/h via INTRAVENOUS
  Administered 2023-01-28 (×2): 0.6 ug/kg/h via INTRAVENOUS
  Filled 2023-01-20 (×24): qty 100
  Filled 2023-01-20: qty 200
  Filled 2023-01-20 (×11): qty 100

## 2023-01-20 NOTE — Progress Notes (Signed)
   01/20/23 1113  Vent Select  Invasive or Noninvasive Invasive  Adult Vent Y  Airway 8.5 mm  Placement Date/Time: 01/17/23 (c) 1520   Grade View: Grade 1  Airway Device: Endotracheal Tube  Laryngoscope Blade: (c)   ETT Types: Oral  Size (mm): 8.5 mm  Cuffed: Cuffed;Min.occ.pres.  Insertion attempts: 1  Airway Equipment: Bougie Stylet  Placeme...  Secured at (cm) 25 cm  Measured From Lips  Secured Location Center  Secured By Commercial Tube Holder  Tube Holder Repositioned Yes  Prone position No  Head position Left  Cuff Pressure (cm H2O) Green OR 18-26 CmH2O  Site Condition Cool;Dry (oral care completed.)  Adult Ventilator Settings  Vent Type Servo i  Humidity HME  Vent Mode (S)  PRVC (changed to PRVC, pt fatigue, increased WOB. Pt weaned 3 hrs. 15 minutes on PSV 12/8.)  Vt Set 510 mL  Set Rate 14 bmp  FiO2 (%) 40 %  I Time 0.9 Sec(s)  PEEP 8 cmH20  Adult Ventilator Measurements  Peak Airway Pressure 18 L/min  Mean Airway Pressure 10 cmH20  Resp Rate Spontaneous 8 br/min  Resp Rate Total 22 br/min  Exhaled Vt 960 mL  Measured Ve 9.4 L  I:E Ratio Measured 1:3.7  Total PEEP 8 cmH20  SpO2 95 %  Adult Ventilator Alarms  Alarms On Y  Ve High Alarm 26 L/min  Ve Low Alarm 5 L/min  Resp Rate High Alarm 35 br/min  Resp Rate Low Alarm 8  PEEP Low Alarm 5 cmH2O  Press High Alarm 45 cmH2O  T Apnea 20 sec(s)  VAP Prevention  HME changed Yes  HOB> 30 Degrees Y  Breath Sounds  Bilateral Breath Sounds Diminished  Vent Respiratory Assessment  Level of Consciousness Alert  Respiratory Pattern Regular;Unlabored  Patient Tolerance Tolerated well  Suction Method  Respiratory Interventions Airway suction;Oral suction  Oral Suctioning/Secretions  Suction Type Oral  Suction Device Yankauer  Secretion Amount Small  Secretion Color White  Secretion Consistency Thin  Suction Tolerance Tolerated well  Suctioning Adverse Effects None  Airway Suctioning/Secretions  Suction Type  ETT  Suction Device  Catheter  Secretion Amount Small  Secretion Color White;Tan  Secretion Consistency Thick;Thin  Suction Tolerance Tolerated well  Suctioning Adverse Effects None

## 2023-01-20 NOTE — Progress Notes (Signed)
ANTICOAGULATION CONSULT NOTE  Pharmacy Consult for IV Heparin Indication: atrial fibrillation  Allergies  Allergen Reactions   Codeine Nausea And Vomiting and Other (See Comments)    Hallucinations   Prednisone Other (See Comments)    'Made me feel funny inside my head"-pt cannot be specific    Patient Measurements: Height: 5\' 8"  (172.7 cm) Weight: 78.3 kg (172 lb 9.9 oz) IBW/kg (Calculated) : 63.9 Heparin Dosing Weight: TBW  Vital Signs: Temp: 98.1 F (36.7 C) (05/10 0000) Temp Source: Axillary (05/10 0000) BP: 89/44 (05/09 2300) Pulse Rate: 76 (05/09 2306)  Labs: Recent Labs    01/17/23 0708 01/18/23 0141 01/18/23 2010 01/18/23 2010 01/19/23 0550 01/19/23 1641 01/20/23 0043  HGB 11.8* 11.2*  --   --  9.9*  --   --   HCT 36.9 34.8*  --   --  30.9*  --   --   PLT 413* 376  --   --  314  --   --   APTT  --   --  69*   < > 101* 127* 97*  HEPARINUNFRC  --   --  0.49  --  0.72*  --  0.61  CREATININE 1.02* 1.05*  --   --  0.85  --   --    < > = values in this interval not displayed.     Estimated Creatinine Clearance: 70.7 mL/min (by C-G formula based on SCr of 0.85 mg/dL).   Assessment: 67 y/o F transferred to ICU for respiratory distress and intubated. Patient was recently started on Apixaban for new atrial fibrillation which was held 5/4 in anticipation of diagnostic bronchoscopy. Pharmacy consulted to resume anticoagulation with IV heparin on 5/6.   01/20/2023 00:43 aPTT = 97 seconds, therapeutic with heparin gtt @ 950 units/hr.  Heparin level = 0.61 Currently using aPTT levels for heparin monitoring while Triglycerides elevated. (Elevated Triglycerides >360 can falsely increase heparin level.) No bleeding or infusion related concerns noted  Goal of Therapy:  Heparin level 0.3-0.7 units/ml aPTT 66-102 seconds Monitor platelets by anticoagulation protocol: Yes   Plan:  Continue IV heparin rate @ 950 units/hr  Recheck aPTT and heparin level in 6 hours   Daily CBC, heparin level, aPTT Monitor closely for s/sx of bleeding   Terrilee Files, PharmD 01/20/2023 1:18 AM

## 2023-01-20 NOTE — Progress Notes (Signed)
   01/20/23 0802  Adult Ventilator Settings  Vent Type Servo i  Vent Mode (S)  PSV;CPAP  FiO2 (%) (S)  40 %  Pressure Support (S)  12 cmH20  PEEP (S)  8 cmH20  Adult Ventilator Measurements  Peak Airway Pressure 20 L/min  Mean Airway Pressure 11 cmH20  Resp Rate Spontaneous 16 br/min  Resp Rate Total 16 br/min  Spont TV 727 mL  Measured Ve 9.6 L  Total PEEP 8 cmH20  SpO2 95 %  Adult Ventilator Alarms  Alarms On Y

## 2023-01-20 NOTE — Progress Notes (Signed)
   01/20/23 1512  Adult Ventilator Settings  Vent Type (S)  Other (Comment) (Placed on LTV: VC, 510, 14, 100% 8+ for transport to radiation. Sp02 96%, airway stable, 25 cm lip.)

## 2023-01-20 NOTE — Progress Notes (Signed)
ANTICOAGULATION CONSULT NOTE - Follow Up Consult  Pharmacy Consult for Heparin IV Indication: atrial fibrillation  Allergies  Allergen Reactions   Codeine Nausea And Vomiting and Other (See Comments)    Hallucinations   Prednisone Other (See Comments)    'Made me feel funny inside my head"-pt cannot be specific    Patient Measurements: Height: 5\' 8"  (172.7 cm) Weight: 78.3 kg (172 lb 9.9 oz) IBW/kg (Calculated) : 63.9 Heparin Dosing Weight: TBW  Vital Signs: Temp: 97.9 F (36.6 C) (05/10 0400) Temp Source: Oral (05/10 0400) BP: 116/53 (05/10 0321) Pulse Rate: 79 (05/10 0321)  Labs: Recent Labs    01/18/23 0141 01/18/23 2010 01/19/23 0550 01/19/23 1641 01/20/23 0043 01/20/23 0534 01/20/23 0640  HGB 11.2*  --  9.9*  --   --  9.2*  --   HCT 34.8*  --  30.9*  --   --  29.9*  --   PLT 376  --  314  --   --  300  --   APTT  --    < > 101* 127* 97*  --   --   HEPARINUNFRC  --    < > 0.72*  --  0.61  --  0.50  CREATININE 1.05*  --  0.85  --   --   --   --    < > = values in this interval not displayed.    Estimated Creatinine Clearance: 70.7 mL/min (by C-G formula based on SCr of 0.85 mg/dL).   Medications:  Infusions:   sodium chloride Stopped (01/19/23 1328)   dexmedetomidine (PRECEDEX) IV infusion 0.5 mcg/kg/hr (01/20/23 0654)   feeding supplement (OSMOLITE 1.5 CAL) 55 mL/hr at 01/20/23 0320   fentaNYL infusion INTRAVENOUS 100 mcg/hr (01/20/23 0320)   heparin 950 Units/hr (01/20/23 0320)   norepinephrine (LEVOPHED) Adult infusion 3 mcg/min (01/20/23 0320)   piperacillin-tazobactam (ZOSYN)  IV 3.375 g (01/20/23 1610)    Assessment: 25 yoF with hx bladder/lung cancer, COPD who presented to the ED on 01/11/23 with poor oral intake, n/v and SOB. She was subsequently found to have postobstructive PNA and new onset afib with RVR. She was started in Eliquis for afib on 01/12/23, but held after doses on 5/4, transitioned to heparin on 01/16/23 for bronch with biopsies 5/7,  heparin resumed post procedure on 01/18/23 while she remains intubated.   Today, 01/20/2023: Heparin level 0.5, therapeutic on heparin 950 units/hr aPTT 80, therapeutic - Use aPTT for heparin dosing if TG > 360, however, today TG significantly decreased to 78 and aPTT correlating with heparin level. OK to dose using heparin anti-Xa levels CBC:  Hgb decreased to 9.2, Plt WNL No bleeding or complications reported.  Goal of Therapy:  Heparin level 0.3-0.7 units/ml aPTT 66-102 seconds Monitor platelets by anticoagulation protocol: Yes   Plan:  Continue heparin IV infusion at 950 units/hr Daily heparin level, and CBC Follow up long-term anticoagulation plans   Lynann Beaver PharmD, BCPS [facility] main pharmacy [phone] 01/20/2023 7:11 AM

## 2023-01-20 NOTE — Progress Notes (Signed)
PT Cancellation Note  Patient Details Name: [name] MRN: [id number] DOB: Jan 14, 1956   Cancelled Treatment:    Reason Eval/Treat Not Completed: Medical issues which prohibited therapy Pt remains on vent and RN reports she has been given meds to allow her to rest.  RN states pt may be extubated tomorrow.  Will likely check back on Monday.  RN aware, but also please reorder if pt able to participate prior to Monday.   Kati L Payson 01/20/2023, 11:05 AM Paulino Door, DPT Physical Therapist Acute Rehabilitation Services Office: [phone]

## 2023-01-20 NOTE — Progress Notes (Addendum)
   01/20/23 1613  VAP Prevention  Transported while on vent (S)  Yes (Transported from radiation on LTV to ICU 1233, placed pt back on the Servo I, full support, PRVC.)   Airway stable, 25 cm lip.

## 2023-01-20 NOTE — Progress Notes (Signed)
eLink Physician-Brief Progress Note Patient Name: [name] DOB: 11/13/1955 MRN: [id number]   Date of Service  01/20/2023  HPI/Events of Note  Patient is intubated and at high risk of self extubation.  eICU Interventions  Bilateral soft wrist restraints order renewed,        Migdalia Dk 01/20/2023, 11:02 PM

## 2023-01-20 NOTE — Progress Notes (Signed)
NAME:  [name], MRN:  [id number], DOB:  1956/07/15, LOS: 8 ADMISSION DATE:  01/11/2023, CONSULTATION DATE:  01/16/2023 REFERRING MD: Family Practice , CHIEF COMPLAINT:  Acute Respiratory Failure in the setting of Lung Cancer, COPD and infiltrate on CXR   History of Present Illness:  [name] is a 67 y.o.female with a history of hx of COPD, recurrent adenocarcinoma of LLL, (Biopsy was planned for 5/7) and panic/anxiety that was admitted for AHRF in setting of CAP/ COPD exacerbation to [facility] Teaching Service at [facility].   Presented to ED 01/12/2023 for N/V, abm pain, and diarrhea for 2 wks. Abm exam benign, transaminases wnl, and lipase wnl. CTAP and CXR did show developing PNA of RLL. Patient also tachypneic and had increased O2 requirement (Baseline 2L [place], needing up to 8L [place]). Pulmonology was consulted. Patient also had panic attacks/anxiety that exacerbated work of breathing and vocal cord dysfunction, leading to stridor. AHRF thought to be multifactorial including CAP, COPD exacerbation, and vocal cord dysfunction. Patient started on ceftriaxone and azithromycin for CAP coverage. Patient started on DuoNebs and Solu-Medrol for COPD exacerbation. Patient was continued on formulary equivalent of home Breztri. Patient was continued on home Celexa and Atarax, and mirtazapine was added to better control anxiety. Patient completed 3-day course of azithromycin and ceftriaxone was transitioned to cefdinir to finish 5-day total course.  Pt. Was scheduled for Bronch with biopsies 01/17/2023 to further work up her suspected recurrence of lung cancer. Obviously this will be delayed  Pt. Required transfer from floor to Progressive unit  for BiPAP on 5/5 for increased work of breathing. She has decompensated overnight and this am  (01/15/2022)  work of breathing  has increased. Pt is a DNR, and she endorsed that she was not ready to die and wanted to be intubated. (See Prior Note documentation 01/16/2023)   On arrival  to ICU 5/6 patient intubated.   Pertinent  Medical History   Past Medical History:  Diagnosis Date   Abnormal CT lung screening 07/10/2020   Anxiety    Anxiety state 02/28/2012   GAD7 : 6  PHQ9 : 6   Arthritis    Bladder cancer (HCC) 09/2020   urologist--- dr Berneice Heinrich, incidental finding on PET scan 12/ 2021,  s/p TURBT 09-18-2020 high grade papillary urothelial    Cancer related pain 03/11/2022   CAP (community acquired pneumonia) 04/20/2022   Centrilobular emphysema (HCC)    Chronic left shoulder pain 04/29/2021   Close exposure to COVID-19 virus 05/03/2019   Constipation 04/09/2019   Costochondritis, acute 08/14/2020   DDD (degenerative disc disease), lumbar    Depression    DOE (dyspnea on exertion)    10-29-2020  per pt can do house work without sob, when walks/ stairs stops frequently  (stated recovers quickly when stops)   Emphysema lung (HCC)    Epidermoid cyst 10/29/2018   Recurrent. Located on left hip at iliac crest 2" anterior to midaxillary line. Removed 2013, 2020, 2022.    Epigastric pain 11/16/2020   Fibroid 2003   GERD (gastroesophageal reflux disease)    History of radiation therapy    Left lung- 10/06/20-10/16/20- Dr. Antony Blackbird   Leg cramping 11/13/2019   Lumbar spine pain 01/09/2015   Menopausal vaginal dryness 11/13/2019   Multiple closed fractures of ribs of left side 08/20/2022   Nocturnal enuresis 06/23/2020   Chronic overnight "leaking" of unknown source. Likely urine, but reports no color or odor. Volume enough for liners or light pads.  Occurs every night.    Primary adenocarcinoma of lower lobe of left lung (HCC) 07/2020   oncologist--- dr Arbutus Ped---  dx 11/ 2021 bilateral lower lobe masses,  s/p bronchoscopy w/ bx's 07-28-2020 malignancy cells on left ;  completed SBRT bilateral lower lung 10-16-2020 5 fractions   Radiation-induced dermatitis    on 10-29-2020 pt complaint stomach and chest areas,  completed SBRT 10-16-2020   Seasonal allergies     Stage 3 severe COPD by GOLD classification [facility])    pulmonologist--- dr Tonia Brooms,  using anoro inhaler daily   Wears glasses      Significant Hospital Events: Including procedures, antibiotic start and stop dates in addition to other pertinent events   01/16/2023 Transferred to ICU for acute Respiratory Failure , intubated  5/7 bronch by Icard. significant extrinsic compression of the airways in the right lower lobe and middle lobe. This is likely malignant obstruction. Bx sent. Transferred to [facility] for rad/onc eval. PICC placed 5/8 stable still high peep/fio2 needs. Rad/onc consult requested  5/10 no acute events overnight remains on ventilator, tolerating SBT trial this a.m. on low-dose fentanyl and Precedex  Interim History / Subjective:  Opens eyes to verbal command 5 L volume positive  Objective   Blood pressure (!) 110/52, pulse 79, temperature 99.7 F (37.6 C), temperature source Axillary, resp. rate 15, height 5\' 8"  (1.727 m), weight 78.3 kg, SpO2 95 %.    Vent Mode: PRVC FiO2 (%):  [40 %-100 %] 40 % Set Rate:  [14 bmp] 14 bmp Vt Set:  [510 mL] 510 mL PEEP:  [8 cmH20] 8 cmH20 Pressure Support:  [12 cmH20] 12 cmH20 Plateau Pressure:  [20 cmH20-21 cmH20] 20 cmH20   Intake/Output Summary (Last 24 hours) at 01/20/2023 1610 Last data filed at 01/20/2023 0600 Gross per 24 hour  Intake 2151.36 ml  Output 2050 ml  Net 101.36 ml    Filed Weights   01/17/23 1800 01/18/23 0500 01/19/23 0703  Weight: 78.3 kg 78.3 kg 78.3 kg    Examination: General: Acute on chronic ill-appearing middle-aged female lying in bed in no acute distress HEENT: ETT, MM pink/moist, PERRL,  Neuro: Denies to verbal command, able to follow simple commands CV: s1s2 regular rate and rhythm, no murmur, rubs, or gallops,  PULM: Clear to auscultation bilaterally, no increased work of breathing, tolerating ventilator, FiO2 40% GI: soft, bowel sounds active in all 4 quadrants, non-tender, non-distended, tolerating  TF Extremities: warm/dry, no edema  Skin: no rashes or lesions  Resolved Hospital Problem list   Hypertriglyceridemia due to propofol Hyponatremia  Assessment & Plan:   Acute respiratory failure in setting of COPD, recurrent lung cancer w/ post obstructive atelectasis +/- post obstructive PNA stable effusion P: Continue ventilator support with lung protective strategies  Pending repeat dose of radiation this a.m., hold extubation until completed vs tomorrow  Wean PEEP and FiO2 for sats greater than 90%. Head of bed elevated 30 degrees. Plateau pressures less than 30 cm H20.  Follow intermittent chest x-ray and ABG.   SAT/SBT as tolerated, mentation preclude extubation  Ensure adequate pulmonary hygiene  Follow cultures  VAP bundle in place  PAD protocol Continue empiric Zosyn Continue bronchodilators  H/o VCD P: Postextubation monitor closely may need steroids/racemic epi  Hypotension, Drug induced from sedation  P: Minimize sedation as able Continue midodrine Vasopressor support for MAP goal greater than 65 Continuous telemetry  Paroxysmal atrial Fibrillation  -Anticoagulated at baseline with apixaban P: Continue IV heparin drip Holding home beta-blockers Continuous  telemetry  At risk for malnutrition  P: Continue tube feeds  Hyperglycemia P: Continue SSI CBG goal 140-180  CBG checks every 4 hours  Anemia P: Trend CBC Transfuse per protocol Hemoglobin goal greater than 7  Urinary retention likely 2/2 sedation P: Continue bethanechol Continue foley, consider voiding trial   Best Practice (right click and "Reselect all SmartList Selections" daily)   Diet/type: tubefeeds DVT prophylaxis: heparin gtt  GI prophylaxis: H2B Lines: Central line- picc Foley:  yes Code Status:  full code Last date of multidisciplinary goals of care discussion [01/16/2023 with code status change]  CRITICAL CARE Performed by: Colton Tassin D. Harris  Total critical care  time: 38 minutes  Critical care time was exclusive of separately billable procedures and treating other patients.  Critical care was necessary to treat or prevent imminent or life-threatening deterioration.  Critical care was time spent personally by me on the following activities: development of treatment plan with patient and/or surrogate as well as nursing, discussions with consultants, evaluation of patient's response to treatment, examination of patient, obtaining history from patient or surrogate, ordering and performing treatments and interventions, ordering and review of laboratory studies, ordering and review of radiographic studies, pulse oximetry and re-evaluation of patient's condition.  Ayane Delancey D. Harris, NP-C [facility] Personal contact information can be found on Amion  If no contact or response made please call [phone] 01/20/2023, 8:16 AM

## 2023-01-20 NOTE — Progress Notes (Signed)
Pharmacy Antibiotic Note  [name] is a 67 y.o. female admitted on 01/11/2023 with pneumonia.  Pharmacy has been consulted for Zosyn dosing for HCAP on day 5 of admission, worsening leukocytosis, and new respiratory distress requiring intubation.   Today is day #5 Zosyn, total abx day #9 SCr < 1, Afebrile, WBC remains elevated at 15.9  Plan: Zosyn 3.375g IV q8h (4 hour infusion). Follow up renal function, culture results, clinical course, and LOT.   Height: 5\' 8"  (172.7 cm) Weight: 78.3 kg (172 lb 9.9 oz) IBW/kg (Calculated) : 63.9  Temp (24hrs), Avg:98.4 F (36.9 C), Min:97.9 F (36.6 C), Max:98.8 F (37.1 C)  Recent Labs  Lab 01/16/23 0117 01/16/23 1124 01/16/23 1449 01/17/23 0708 01/18/23 0141 01/19/23 0550 01/20/23 0534  WBC 19.0*  --   --  22.6* 19.8* 14.4* 15.9*  CREATININE 0.93  --   --  1.02* 1.05* 0.85 0.97  LATICACIDVEN  --  1.4 2.1*  --   --   --   --      Estimated Creatinine Clearance: 61.9 mL/min (by C-G formula based on SCr of 0.97 mg/dL).    Allergies  Allergen Reactions   Codeine Nausea And Vomiting and Other (See Comments)    Hallucinations   Prednisone Other (See Comments)    'Made me feel funny inside my head"-pt cannot be specific    Antimicrobials this admission: Azithro 5/2 >> 5/4 CTX 5/2 >>5/3; cefdinir 5/4>>5/6 Zosyn 5/6 >>  Dose adjustments this admission:   Microbiology results: 5/2 BCx1 ngF 5/3 Covid: neg 5/6 MRSA PCR - negative Strep pneumo - negative Legionella negative 5/7 TA: gram stain neg, ngtd  Thank you for allowing pharmacy to be a part of this patient's care.  Lynann Beaver PharmD, BCPS [facility] main pharmacy [phone] 01/20/2023 7:49 AM

## 2023-01-21 ENCOUNTER — Inpatient Hospital Stay (HOSPITAL_COMMUNITY): Payer: 59

## 2023-01-21 DIAGNOSIS — J9601 Acute respiratory failure with hypoxia: Secondary | ICD-10-CM | POA: Diagnosis not present

## 2023-01-21 DIAGNOSIS — C3401 Malignant neoplasm of right main bronchus: Secondary | ICD-10-CM | POA: Diagnosis not present

## 2023-01-21 DIAGNOSIS — J189 Pneumonia, unspecified organism: Secondary | ICD-10-CM | POA: Diagnosis not present

## 2023-01-21 LAB — GLUCOSE, CAPILLARY
Glucose-Capillary: 123 mg/dL — ABNORMAL HIGH (ref 70–99)
Glucose-Capillary: 155 mg/dL — ABNORMAL HIGH (ref 70–99)
Glucose-Capillary: 152 mg/dL — ABNORMAL HIGH (ref 70–99)
Glucose-Capillary: 134 mg/dL — ABNORMAL HIGH (ref 70–99)
Glucose-Capillary: 150 mg/dL — ABNORMAL HIGH (ref 70–99)
Glucose-Capillary: 147 mg/dL — ABNORMAL HIGH (ref 70–99)

## 2023-01-21 LAB — CBC
HCT: 30 % — ABNORMAL LOW (ref 36.0–46.0)
Hemoglobin: 9.2 g/dL — ABNORMAL LOW (ref 12.0–15.0)
MCH: 27.4 pg (ref 26.0–34.0)
MCHC: 30.7 g/dL (ref 30.0–36.0)
MCV: 89.3 fL (ref 80.0–100.0)
Platelets: 314 10*3/uL (ref 150–400)
RBC: 3.36 MIL/uL — ABNORMAL LOW (ref 3.87–5.11)
RDW: 15.5 % (ref 11.5–15.5)
WBC: 12.3 10*3/uL — ABNORMAL HIGH (ref 4.0–10.5)
nRBC: 0 % (ref 0.0–0.2)

## 2023-01-21 LAB — HEPARIN LEVEL (UNFRACTIONATED)
Heparin Unfractionated: 0.27 IU/mL — ABNORMAL LOW (ref 0.30–0.70)
Heparin Unfractionated: 0.24 IU/mL — ABNORMAL LOW (ref 0.30–0.70)
Heparin Unfractionated: 0.53 IU/mL (ref 0.30–0.70)

## 2023-01-21 MED ORDER — HEPARIN BOLUS VIA INFUSION
1000.0000 [IU] | Freq: Once | INTRAVENOUS | Status: AC
  Administered 2023-01-21: 1000 [IU] via INTRAVENOUS
  Filled 2023-01-21: qty 1000

## 2023-01-21 MED ORDER — HEPARIN (PORCINE) 25000 UT/250ML-% IV SOLN
1350.0000 [IU]/h | INTRAVENOUS | Status: AC
  Administered 2023-01-22 – 2023-01-25 (×4): 1200 [IU]/h via INTRAVENOUS
  Administered 2023-01-26: 1350 [IU]/h via INTRAVENOUS
  Administered 2023-01-26: 1200 [IU]/h via INTRAVENOUS
  Filled 2023-01-21 (×6): qty 250

## 2023-01-21 MED ORDER — GLYCOPYRROLATE 1 MG PO TABS
1.0000 mg | ORAL_TABLET | Freq: Two times a day (BID) | ORAL | Status: AC
  Administered 2023-01-21 – 2023-01-23 (×6): 1 mg
  Filled 2023-01-21 (×6): qty 1

## 2023-01-21 NOTE — Progress Notes (Signed)
ANTICOAGULATION CONSULT NOTE - Follow Up Consult  Pharmacy Consult for Heparin IV Indication: atrial fibrillation  Allergies  Allergen Reactions   Codeine Nausea And Vomiting and Other (See Comments)    Hallucinations   Prednisone Other (See Comments)    'Made me feel funny inside my head"-pt cannot be specific    Patient Measurements: Height: 5\' 8"  (172.7 cm) Weight: 83.6 kg (184 lb 4.9 oz) IBW/kg (Calculated) : 63.9 Heparin Dosing Weight: TBW  Vital Signs: Temp: 98.9 F (37.2 C) (05/11 0300) Temp Source: Axillary (05/11 0300) BP: 102/47 (05/11 0326) Pulse Rate: 71 (05/11 0326)  Labs: Recent Labs    01/19/23 0550 01/19/23 1641 01/20/23 0043 01/20/23 0534 01/20/23 0640 01/21/23 0457  HGB 9.9*  --   --  9.2*  --  9.2*  HCT 30.9*  --   --  29.9*  --  30.0*  PLT 314  --   --  300  --  314  APTT 101* 127* 97*  --  80*  --   HEPARINUNFRC 0.72*  --  0.61  --  0.50 0.27*  CREATININE 0.85  --   --  0.97  --   --      Estimated Creatinine Clearance: 63.8 mL/min (by C-G formula based on SCr of 0.97 mg/dL).   Medications:  Infusions:   sodium chloride Stopped (01/19/23 1328)   dexmedetomidine (PRECEDEX) IV infusion 0.8 mcg/kg/hr (01/21/23 0332)   feeding supplement (OSMOLITE 1.5 CAL) 55 mL/hr at 01/21/23 0308   fentaNYL infusion INTRAVENOUS 100 mcg/hr (01/21/23 0532)   heparin 950 Units/hr (01/21/23 0308)   norepinephrine (LEVOPHED) Adult infusion 2 mcg/min (01/21/23 0308)   piperacillin-tazobactam (ZOSYN)  IV 3.375 g (01/21/23 0530)    Assessment: 80 yoF with hx bladder/lung cancer, COPD who presented to the ED on 01/11/23 with poor oral intake, n/v and SOB. She was subsequently found to have postobstructive PNA and new onset afib with RVR. She was started in Eliquis for afib on 01/12/23, but held after doses on 5/4, transitioned to heparin on 01/16/23 for bronch with biopsies 5/7, heparin resumed post procedure on 01/18/23 while she remains intubated.   Today,  01/21/2023: -Heparin level 0.27 (subtherapeutic today after being therapeutic x 2 yesterday) on heparin 950 units/hr -CBC:  Hgb remains 9.2, Plt WNL - RN shared only interruption in heparin infusion was to change out the IV lines - No bleeding complications reported  Goal of Therapy:  Heparin level 0.3-0.7 units/ml aPTT 66-102 seconds Monitor platelets by anticoagulation protocol: Yes   Plan:  Increase heparin IV infusion to 1050 units/hr Check heparin level 6 hr after rate increase Daily heparin level, and CBC Follow up long-term anticoagulation plans   Terrilee Files, PharmD 01/21/2023 6:28 AM

## 2023-01-21 NOTE — Progress Notes (Signed)
NAME:  [name], MRN:  [id number], DOB:  1955-11-19, LOS: 9 ADMISSION DATE:  01/11/2023, CONSULTATION DATE:  01/16/2023 REFERRING MD: Family Practice , CHIEF COMPLAINT:  Acute Respiratory Failure in the setting of Lung Cancer, COPD and infiltrate on CXR   History of Present Illness:  [name] is a 67 y.o.female with a history of hx of COPD, h/o adenocarcinoma of LLL, (Biopsy was planned for 5/7) and panic/anxiety that was admitted for AHRF in setting of CAP/ COPD exacerbation to [facility] Teaching Service at [facility].   Presented to ED 01/12/2023 for N/V, abd pain, and diarrhea for 2 wks. found to have postobstructive pneumonia right middle/lower lobe with progressive respiratory failure requiring intubation 5/6 patient also had panic attacks/anxiety that exacerbated work of breathing and vocal cord dysfunction, leading to stridor.  Pt is a DNR, and she endorsed that she was not ready to die and wanted to be intubated. (See Prior Note documentation 01/16/2023)    Pertinent  Medical History   Past Medical History:  Diagnosis Date   Abnormal CT lung screening 07/10/2020   Anxiety    Anxiety state 02/28/2012   GAD7 : 6  PHQ9 : 6   Arthritis    Bladder cancer (HCC) 09/2020   urologist--- dr Berneice Heinrich, incidental finding on PET scan 12/ 2021,  s/p TURBT 09-18-2020 high grade papillary urothelial    Cancer related pain 03/11/2022   CAP (community acquired pneumonia) 04/20/2022   Centrilobular emphysema (HCC)    Chronic left shoulder pain 04/29/2021   Close exposure to COVID-19 virus 05/03/2019   Constipation 04/09/2019   Costochondritis, acute 08/14/2020   DDD (degenerative disc disease), lumbar    Depression    DOE (dyspnea on exertion)    10-29-2020  per pt can do house work without sob, when walks/ stairs stops frequently  (stated recovers quickly when stops)   Emphysema lung (HCC)    Epidermoid cyst 10/29/2018   Recurrent. Located on left hip at iliac crest 2" anterior to midaxillary line.  Removed 2013, 2020, 2022.    Epigastric pain 11/16/2020   Fibroid 2003   GERD (gastroesophageal reflux disease)    History of radiation therapy    Left lung- 10/06/20-10/16/20- Dr. Antony Blackbird   Leg cramping 11/13/2019   Lumbar spine pain 01/09/2015   Menopausal vaginal dryness 11/13/2019   Multiple closed fractures of ribs of left side 08/20/2022   Nocturnal enuresis 06/23/2020   Chronic overnight "leaking" of unknown source. Likely urine, but reports no color or odor. Volume enough for liners or light pads. Occurs every night.    Primary adenocarcinoma of lower lobe of left lung (HCC) 07/2020   oncologist--- dr Arbutus Ped---  dx 11/ 2021 bilateral lower lobe masses,  s/p bronchoscopy w/ bx's 07-28-2020 malignancy cells on left ;  completed SBRT bilateral lower lung 10-16-2020 5 fractions   Radiation-induced dermatitis    on 10-29-2020 pt complaint stomach and chest areas,  completed SBRT 10-16-2020   Seasonal allergies    Stage 3 severe COPD by GOLD classification [facility])    pulmonologist--- dr Tonia Brooms,  using anoro inhaler daily   Wears glasses      Significant Hospital Events: Including procedures, antibiotic start and stop dates in addition to other pertinent events   01/16/2023 Transferred to ICU for acute Respiratory Failure , intubated  5/7 bronch by Icard. significant extrinsic compression of the airways in the right lower lobe and middle lobe. This is likely malignant obstruction. Bx sent. Transferred to [facility]  for rad/onc eval. PICC placed 5/8 stable still high peep/fio2 needs. Rad/onc consult requested  5/10 tolerating SBT trial this a.m. on low-dose fentanyl and Precedex.  Propofol DC'd due to high triglycerides  Interim History / Subjective:   Critically ill, hypotensive on low-dose Levophed. On Precedex and fentanyl Required frequent Versed pushes and wrist restraints overnight  Objective   Blood pressure (!) 101/49, pulse 69, temperature 98.9 F (37.2 C), temperature source  Axillary, resp. rate 17, height 5\' 8"  (1.727 m), weight 83.6 kg, SpO2 95 %.    Vent Mode: PRVC FiO2 (%):  [40 %] 40 % Set Rate:  [14 bmp] 14 bmp Vt Set:  [510 mL] 510 mL PEEP:  [8 cmH20] 8 cmH20 Pressure Support:  [12 cmH20] 12 cmH20 Plateau Pressure:  [12 cmH20-19 cmH20] 19 cmH20   Intake/Output Summary (Last 24 hours) at 01/21/2023 0759 Last data filed at 01/21/2023 0631 Gross per 24 hour  Intake 3065.26 ml  Output 850 ml  Net 2215.26 ml    Filed Weights   01/18/23 0500 01/19/23 0703 01/21/23 0500  Weight: 78.3 kg 78.3 kg 83.6 kg    Examination: General: Acute on chronic ill-appearing middle-aged female lying in bed in no acute distress HEENT: ETT, MM pink/moist, PERRL,  Neuro: RASS 0 to +1, follows one-step commands CV: s1s2 regular rate and rhythm, no murmur, rubs, or gallops,  PULM: Decreased breath sounds right base, no accessory muscle use, some double clutching GI: soft, bowel sounds active in all 4 quadrants, non-tender, non-distended, tolerating TF Extremities: warm/dry, no edema  Skin: no rashes or lesions  Chest x-ray 5/9 shows ET tube at carina, bilateral lower lobe consolidation Labs show decreased leukocytosis, stable anemia  Resolved Hospital Problem list   Hypertriglyceridemia due to propofol Hyponatremia  Assessment & Plan:   Acute respiratory failure in setting of COPD, recurrent lung cancer w/ post obstructive atelectasis +/- post obstructive PNA stable effusion COPD P: Tolerating spontaneous breathing trials, pressure support 10/5. Decreased trigger to 0 to avoid double clutching Would need to clarify reintubation status prior to extubation, ideally would like to complete few rounds of radiation before extubating to fix the primary problem which is postobstructive pneumonia due to cancer VAP bundle in place  Budesonide Brovana and Yupelri combination Continue empiric Zosyn x 7ds  Chest x-ray today for ET tube position  H/o  VCD P: Postextubation monitor closely may need steroids/racemic epi  Hypotension, Drug induced from sedation  P: PAD protocol -Precedex and fentanyl drip but goal RASS 0 to -1, using Versed pushes for breakthrough Continue midodrine Levophed for MAP goal greater than 65 Continuous telemetry  Paroxysmal atrial Fibrillation  -Anticoagulated at baseline with apixaban P: Continue IV heparin drip Holding home beta-blockers Continuous telemetry  At risk for malnutrition  P: Continue tube feeds  Hyperglycemia P: Continue SSI CBG goal 140-180  CBG checks every 4 hours  Anemia P: Trend CBC Transfuse per protocol Hemoglobin goal greater than 7  Urinary retention likely 2/2 sedation P: Continue bethanechol Continue foley while intubated  Best Practice (right click and "Reselect all SmartList Selections" daily)   Diet/type: tubefeeds DVT prophylaxis: heparin gtt  GI prophylaxis: H2B Lines: Central line- picc Foley:  yes Code Status:  full code Last date of multidisciplinary goals of care discussion [01/16/2023 with code status change]  CRITICAL CARE Performed by: Comer Locket Donnelle Rubey  Total critical care time: 35 minutes  Critical care time was exclusive of separately billable procedures and treating other patients.  Critical care was  necessary to treat or prevent imminent or life-threatening deterioration.  Critical care was time spent personally by me on the following activities: development of treatment plan with patient and/or surrogate as well as nursing, discussions with consultants, evaluation of patient's response to treatment, examination of patient, obtaining history from patient or surrogate, ordering and performing treatments and interventions, ordering and review of laboratory studies, ordering and review of radiographic studies, pulse oximetry and re-evaluation of patient's condition.  Cyril Mourning MD. Tonny Bollman. [facility] Pager : [phone]  If no response to pager , please call [phone] until 7 pm After 7:00 pm call Elink  [phone]     01/21/2023, 7:59 AM

## 2023-01-21 NOTE — Progress Notes (Signed)
ANTICOAGULATION CONSULT NOTE - Follow Up Consult  Pharmacy Consult for Heparin IV Indication: atrial fibrillation  Allergies  Allergen Reactions   Codeine Nausea And Vomiting and Other (See Comments)    Hallucinations   Prednisone Other (See Comments)    'Made me feel funny inside my head"-pt cannot be specific    Patient Measurements: Height: 5\' 8"  (172.7 cm) Weight: 83.6 kg (184 lb 4.9 oz) IBW/kg (Calculated) : 63.9 Heparin Dosing Weight: 81mg   Vital Signs: Temp: 98.1 F (36.7 C) (05/11 2043) Temp Source: Axillary (05/11 2043) BP: 126/55 (05/11 2043) Pulse Rate: 91 (05/11 2043)  Labs: Recent Labs    01/19/23 0550 01/19/23 1641 01/20/23 0043 01/20/23 0534 01/20/23 0640 01/21/23 0457 01/21/23 1251 01/21/23 2135  HGB 9.9*  --   --  9.2*  --  9.2*  --   --   HCT 30.9*  --   --  29.9*  --  30.0*  --   --   PLT 314  --   --  300  --  314  --   --   APTT 101* 127* 97*  --  80*  --   --   --   HEPARINUNFRC 0.72*  --  0.61  --  0.50 0.27* 0.24* 0.53  CREATININE 0.85  --   --  0.97  --   --   --   --      Estimated Creatinine Clearance: 63.8 mL/min (by C-G formula based on SCr of 0.97 mg/dL).   Medications:  Infusions:   sodium chloride Stopped (01/19/23 1328)   dexmedetomidine (PRECEDEX) IV infusion 0.9 mcg/kg/hr (01/21/23 1829)   feeding supplement (OSMOLITE 1.5 CAL) 55 mL/hr at 01/21/23 1829   fentaNYL infusion INTRAVENOUS 125 mcg/hr (01/21/23 1829)   heparin 1,200 Units/hr (01/21/23 1829)   norepinephrine (LEVOPHED) Adult infusion Stopped (01/21/23 1458)   piperacillin-tazobactam (ZOSYN)  IV 3.375 g (01/21/23 2202)    Assessment: 55 yoF with hx bladder/lung cancer, COPD who presented to the ED on 01/11/23 with poor oral intake, n/v and SOB. She was subsequently found to have postobstructive PNA and new onset afib with RVR. She was started in Eliquis for afib on 01/12/23, but held after doses on 5/4, transitioned to heparin on 01/16/23 for bronch with biopsies 5/7,  heparin resumed post procedure on 01/18/23 while she remains intubated.   Today, 01/21/2023: - Heparin level decreased to 0.24, subtherapeutic despite previous rate increase to heparin 1050 units/hr -CBC:  Hgb remains 9.2, Plt WNL - RN reports no bleeding complications reported.   21:35 heparin level = 0.53 (therapeutic) with heparin gtt @ 1200 units/hr No complications of therapy noted  Goal of Therapy:  Heparin level 0.3-0.7 units/ml aPTT 66-102 seconds Monitor platelets by anticoagulation protocol: Yes   Plan:  Continue heparin IV infusion @ 1200 units/hr Daily heparin level, and CBC Follow up long-term anticoagulation plans   Terrilee Files, PharmD 01/21/2023 10:28 PM

## 2023-01-21 NOTE — Progress Notes (Signed)
ANTICOAGULATION CONSULT NOTE - Follow Up Consult  Pharmacy Consult for Heparin IV Indication: atrial fibrillation  Allergies  Allergen Reactions   Codeine Nausea And Vomiting and Other (See Comments)    Hallucinations   Prednisone Other (See Comments)    'Made me feel funny inside my head"-pt cannot be specific    Patient Measurements: Height: 5\' 8"  (172.7 cm) Weight: 83.6 kg (184 lb 4.9 oz) IBW/kg (Calculated) : 63.9 Heparin Dosing Weight: 81mg   Vital Signs: Temp: 98.7 F (37.1 C) (05/11 1018) Temp Source: Axillary (05/11 1018) BP: 105/53 (05/11 1100) Pulse Rate: 82 (05/11 1100)  Labs: Recent Labs    01/19/23 0550 01/19/23 1641 01/20/23 0043 01/20/23 0534 01/20/23 0640 01/21/23 0457  HGB 9.9*  --   --  9.2*  --  9.2*  HCT 30.9*  --   --  29.9*  --  30.0*  PLT 314  --   --  300  --  314  APTT 101* 127* 97*  --  80*  --   HEPARINUNFRC 0.72*  --  0.61  --  0.50 0.27*  CREATININE 0.85  --   --  0.97  --   --      Estimated Creatinine Clearance: 63.8 mL/min (by C-G formula based on SCr of 0.97 mg/dL).   Medications:  Infusions:   sodium chloride Stopped (01/19/23 1328)   dexmedetomidine (PRECEDEX) IV infusion 0.6 mcg/kg/hr (01/21/23 1114)   feeding supplement (OSMOLITE 1.5 CAL) 55 mL/hr at 01/21/23 1114   fentaNYL infusion INTRAVENOUS 100 mcg/hr (01/21/23 1114)   heparin 1,050 Units/hr (01/21/23 1251)   norepinephrine (LEVOPHED) Adult infusion Stopped (01/21/23 0915)   piperacillin-tazobactam (ZOSYN)  IV Stopped (01/21/23 0930)    Assessment: 1 yoF with hx bladder/lung cancer, COPD who presented to the ED on 01/11/23 with poor oral intake, n/v and SOB. She was subsequently found to have postobstructive PNA and new onset afib with RVR. She was started in Eliquis for afib on 01/12/23, but held after doses on 5/4, transitioned to heparin on 01/16/23 for bronch with biopsies 5/7, heparin resumed post procedure on 01/18/23 while she remains intubated.   Today,  01/21/2023: - Heparin level decreased to 0.24, subtherapeutic despite previous rate increase to heparin 1050 units/hr -CBC:  Hgb remains 9.2, Plt WNL - RN reports no bleeding complications reported.   Goal of Therapy:  Heparin level 0.3-0.7 units/ml aPTT 66-102 seconds Monitor platelets by anticoagulation protocol: Yes   Plan:  Heparin bolus 1000 units IV  Increase heparin IV infusion to 1200 units/hr Check heparin level 6 hr after rate change Daily heparin level, and CBC Follow up long-term anticoagulation plans   Lynann Beaver PharmD, BCPS [facility] main pharmacy [phone] 01/21/2023 12:52 PM

## 2023-01-22 ENCOUNTER — Encounter (HOSPITAL_COMMUNITY): Payer: Self-pay | Admitting: Pulmonary Disease

## 2023-01-22 DIAGNOSIS — R4589 Other symptoms and signs involving emotional state: Secondary | ICD-10-CM

## 2023-01-22 DIAGNOSIS — J9601 Acute respiratory failure with hypoxia: Secondary | ICD-10-CM | POA: Diagnosis not present

## 2023-01-22 DIAGNOSIS — C3401 Malignant neoplasm of right main bronchus: Secondary | ICD-10-CM | POA: Diagnosis not present

## 2023-01-22 DIAGNOSIS — Z7189 Other specified counseling: Secondary | ICD-10-CM | POA: Diagnosis not present

## 2023-01-22 DIAGNOSIS — J189 Pneumonia, unspecified organism: Secondary | ICD-10-CM | POA: Diagnosis not present

## 2023-01-22 DIAGNOSIS — Z515 Encounter for palliative care: Secondary | ICD-10-CM

## 2023-01-22 LAB — BASIC METABOLIC PANEL
Anion gap: 9 (ref 5–15)
BUN: 25 mg/dL — ABNORMAL HIGH (ref 8–23)
CO2: 27 mmol/L (ref 22–32)
Calcium: 8.3 mg/dL — ABNORMAL LOW (ref 8.9–10.3)
Chloride: 102 mmol/L (ref 98–111)
Creatinine, Ser: 0.92 mg/dL (ref 0.44–1.00)
GFR, Estimated: >60 mL/min (ref >60)
Glucose, Bld: 171 mg/dL — ABNORMAL HIGH (ref 70–99)
Potassium: 4.7 mmol/L (ref 3.5–5.1)
Sodium: 138 mmol/L (ref 135–145)

## 2023-01-22 LAB — GLUCOSE, CAPILLARY
Glucose-Capillary: 130 mg/dL — ABNORMAL HIGH (ref 70–99)
Glucose-Capillary: 147 mg/dL — ABNORMAL HIGH (ref 70–99)
Glucose-Capillary: 135 mg/dL — ABNORMAL HIGH (ref 70–99)
Glucose-Capillary: 218 mg/dL — ABNORMAL HIGH (ref 70–99)
Glucose-Capillary: 131 mg/dL — ABNORMAL HIGH (ref 70–99)

## 2023-01-22 LAB — TRIGLYCERIDES: Triglycerides: 115 mg/dL (ref <150)

## 2023-01-22 LAB — CBC
HCT: 29.9 % — ABNORMAL LOW (ref 36.0–46.0)
Hemoglobin: 8.9 g/dL — ABNORMAL LOW (ref 12.0–15.0)
MCH: 26.8 pg (ref 26.0–34.0)
MCHC: 29.8 g/dL — ABNORMAL LOW (ref 30.0–36.0)
MCV: 90.1 fL (ref 80.0–100.0)
Platelets: 308 10*3/uL (ref 150–400)
RBC: 3.32 MIL/uL — ABNORMAL LOW (ref 3.87–5.11)
RDW: 15.3 % (ref 11.5–15.5)
WBC: 11.1 10*3/uL — ABNORMAL HIGH (ref 4.0–10.5)
nRBC: 0 % (ref 0.0–0.2)

## 2023-01-22 LAB — HEPARIN LEVEL (UNFRACTIONATED): Heparin Unfractionated: 0.59 IU/mL (ref 0.30–0.70)

## 2023-01-22 MED ORDER — BUDESONIDE 0.5 MG/2ML IN SUSP
0.5000 mg | Freq: Two times a day (BID) | RESPIRATORY_TRACT | Status: DC
  Administered 2023-01-22 – 2023-03-05 (×84): 0.5 mg via RESPIRATORY_TRACT
  Filled 2023-01-22 (×84): qty 2

## 2023-01-22 MED ORDER — GABAPENTIN 300 MG PO CAPS
600.0000 mg | ORAL_CAPSULE | Freq: Three times a day (TID) | ORAL | Status: DC | PRN
  Administered 2023-01-23 – 2023-02-03 (×4): 600 mg
  Filled 2023-01-22 (×4): qty 2

## 2023-01-22 MED ORDER — MELATONIN 3 MG PO TABS
3.0000 mg | ORAL_TABLET | Freq: Every evening | ORAL | Status: DC | PRN
  Administered 2023-01-25 – 2023-02-10 (×3): 3 mg
  Filled 2023-01-22 (×3): qty 1

## 2023-01-22 MED ORDER — DOCUSATE SODIUM 50 MG/5ML PO LIQD
100.0000 mg | Freq: Two times a day (BID) | ORAL | Status: DC | PRN
  Administered 2023-02-05: 100 mg
  Filled 2023-01-22: qty 10

## 2023-01-22 MED ORDER — POLYETHYLENE GLYCOL 3350 17 G PO PACK
17.0000 g | PACK | Freq: Every day | ORAL | Status: DC | PRN
  Administered 2023-02-04 – 2023-02-05 (×2): 17 g
  Filled 2023-01-22 (×2): qty 1

## 2023-01-22 NOTE — Consult Note (Signed)
Consultation Note Date: 01/22/2023   Patient Name: [name]  DOB: 1956/08/17  MRN: [id number]  Age / Sex: 67 y.o., female   PCP: Fayette Pho, MD Referring Physician: Steffanie Dunn, DO  Reason for Consultation: Establishing goals of care     Chief Complaint/History of Present Illness:   Patient is a 67 year old female with a past medical history of COPD, history of adenocarcinoma of the lung LLL, and panic/anxiety disorder, GERD, and depression who was admitted on 01/12/23 for nausea/vomiting, abdominal pain and diarrhea for 2 weeks.  Patient initially admitted to [facility].  Patient was found to have postobstructive pneumonia of the right middle/lower lobe with progressive respiratory failure which required intubation.  Had initially been DNR though elected for full code.  Imaging showed significant extrinsic compression of the airways in the right lower lobe and middle lobe which is likely malignant and was biopsied.  Palliative medicine team consulted to assist with complex medical decision making.  Extensive review of EMR prior to presenting to bedside.  Discussed care with bedside RN for medical updates.  Also able to present to bedside with Dr. Vassie Loll from [facility]. Dr. Vassie Loll had already reviewed possible pathways for medical care moving forward with patient's daughters who were both at bedside.  Able to introduce myself when presenting with Dr. Vassie Loll . Dr. Vassie Loll reviewed discussion he had had with daughters previously as patient is awake now enough to participate in conversation.  Daughter [name] updated Dr. Hughes Better questions he had requested daughter speak to patient about.  Patient can participate by nodding her head yes or no or moving her fingers.  Patient wanting to be extubated when medically stable.  ET tube is causing patient pain.  Patient and family have heard there are risk with extubation and that patient may not do well due to her new cancer mass.  Patient is not a  candidate for systemic chemotherapy currently based on her functional status.  Patient will continue to receive radiation to hopefully assist with management of her lung mass.  Patient is willing to be reintubated should her respiratory status worsen after extubation.  Idea of tracheostomy was also brought up though noted importance of continued conversations in that setting as patient would have cancer that could not be fixed and would only worsen especially if patient is bed bound with trach.  Patient will remain full code at this time.  Dr. Vassie Loll then able to excuse himself and this provider was able to visit more with daughters at bedside.  Daughters names are [place] and English as a second language teacher.  [name] lives here in town while Pedro Earls traveled to be here today.  Discussed patient's family relations and Tonya and me are patient's next of kin.  Discussed that if patient is unable to make medical decisions for herself, [name] would be the person patient would want to make medical decisions for her.  Patient confirmed this by nodding her head yes.  Mia also agrees with this noting that she and her sister discussed all medical care, [name] is closer though so it is easier to have discussions with her.  Acknowledged this.  Noted we will put me as information in the chart as well. Able to offer emotional support via active listening.  Spent time inquiring about patient's choice outside of the hospital.  Daughters noted patient really enjoys [employer] channel and would appreciate spiritual support.  Noted would consult chaplain to assist.  Patient's dignity is also very important to her so patient would  like to receive baths daily.  Acknowledges and noted palliative medicine team will continue to follow along to discuss pathways for medical care moving forward as able.  All questions answered at that time.  Thanked daughters and patient for allowing me to visit with him today.   Primary Diagnoses  Present on Admission:  Acute hypoxic  respiratory failure (HCC) - secondary to CAP  (Resolved) Abdominal pain  Anxiety  Respiratory distress  Respiratory failure (HCC)   Palliative Review of Systems: Pain from ET  Past Medical History:  Diagnosis Date   Abnormal CT lung screening 07/10/2020   Anxiety    Anxiety state 02/28/2012   GAD7 : 6  PHQ9 : 6   Arthritis    Bladder cancer (HCC) 09/2020   urologist--- dr Berneice Heinrich, incidental finding on PET scan 12/ 2021,  s/p TURBT 09-18-2020 high grade papillary urothelial    Cancer related pain 03/11/2022   CAP (community acquired pneumonia) 04/20/2022   Centrilobular emphysema (HCC)    Chronic left shoulder pain 04/29/2021   Close exposure to COVID-19 virus 05/03/2019   Constipation 04/09/2019   Costochondritis, acute 08/14/2020   DDD (degenerative disc disease), lumbar    Depression    DOE (dyspnea on exertion)    10-29-2020  per pt can do house work without sob, when walks/ stairs stops frequently  (stated recovers quickly when stops)   Emphysema lung (HCC)    Epidermoid cyst 10/29/2018   Recurrent. Located on left hip at iliac crest 2" anterior to midaxillary line. Removed 2013, 2020, 2022.    Epigastric pain 11/16/2020   Fibroid 2003   GERD (gastroesophageal reflux disease)    History of radiation therapy    Left lung- 10/06/20-10/16/20- Dr. Antony Blackbird   Leg cramping 11/13/2019   Lumbar spine pain 01/09/2015   Menopausal vaginal dryness 11/13/2019   Multiple closed fractures of ribs of left side 08/20/2022   Nocturnal enuresis 06/23/2020   Chronic overnight "leaking" of unknown source. Likely urine, but reports no color or odor. Volume enough for liners or light pads. Occurs every night.    Primary adenocarcinoma of lower lobe of left lung (HCC) 07/2020   oncologist--- dr Arbutus Ped---  dx 11/ 2021 bilateral lower lobe masses,  s/p bronchoscopy w/ bx's 07-28-2020 malignancy cells on left ;  completed SBRT bilateral lower lung 10-16-2020 5 fractions    Radiation-induced dermatitis    on 10-29-2020 pt complaint stomach and chest areas,  completed SBRT 10-16-2020   Seasonal allergies    Stage 3 severe COPD by GOLD classification [facility])    pulmonologist--- dr Tonia Brooms,  using anoro inhaler daily   Wears glasses    Social History   Socioeconomic History   Marital status: Married    Spouse name: Sherilyn Cooter Bixler   Number of children: 2   Years of education: 12   Highest education level: Not on file  Occupational History   Occupation: Unemployed     Employer:  [employer]    Comment: Sweep   Tobacco Use   Smoking status: Former    Packs/day: 1.00    Years: 50.00    Additional pack years: 0.00    Total pack years: 50.00    Types: Cigarettes    Start date: 04/13/1975    Quit date: 07/14/2020    Years since quitting: 2.5    Passive exposure: Past   Smokeless tobacco: Never  Vaping Use   Vaping Use: Never used  Substance and Sexual Activity   Alcohol use: Yes  Comment: Holidays only   Drug use: Never   Sexual activity: Not Currently    Partners: Male    Birth control/protection: Post-menopausal  Other Topics Concern   Not on file  Social History Narrative   Patient lives alone currently in [place].    Patient is married, they are living separately at this time.    Patient enjoys watching tv, cooking, and spending time with her children and grandchildren.    Social Determinants of Health   Financial Resource Strain: Low Risk  (12/13/2022)   Overall Financial Resource Strain (CARDIA)    Difficulty of Paying Living Expenses: Not hard at all  Food Insecurity: No Food Insecurity (01/13/2023)   Hunger Vital Sign    Worried About Running Out of Food in the Last Year: Never true    Ran Out of Food in the Last Year: Never true  Transportation Needs: No Transportation Needs (01/13/2023)   PRAPARE - Administrator, Civil Service (Medical): No    Lack of Transportation (Non-Medical): No  Physical Activity: Inactive (12/13/2022)    Exercise Vital Sign    Days of Exercise per Week: 0 days    Minutes of Exercise per Session: 0 min  Stress: No Stress Concern Present (12/13/2022)   [employer] of Occupational Health - Occupational Stress Questionnaire    Feeling of Stress : Not at all  Social Connections: Moderately Integrated (12/13/2022)   Social Connection and Isolation Panel [NHANES]    Frequency of Communication with Friends and Family: More than three times a week    Frequency of Social Gatherings with Friends and Family: More than three times a week    Attends Religious Services: More than 4 times per year    Active Member of [employer] or Organizations: Yes    Attends Engineer, structural: More than 4 times per year    Marital Status: Separated   Family History  Problem Relation Age of Onset   Hypertension Mother    Hyperlipidemia Mother    Dementia Father    Lung cancer Father    Parkinson's disease Father    Early death Sister 26       Drug overdose   Throat cancer Brother    Hypertension Daughter    Diabetes Daughter    Scheduled Meds:  arformoterol  15 mcg Nebulization BID   atorvastatin  40 mg Per Tube QHS   baclofen  5 mg Per Tube TID   bethanechol  5 mg Per Tube TID   budesonide (PULMICORT) nebulizer solution  0.25 mg Nebulization BID   calcium carbonate  1 tablet Per Tube BID   Chlorhexidine Gluconate Cloth  6 each Topical Daily   citalopram  10 mg Per Tube Daily   docusate  100 mg Per Tube BID   famotidine  20 mg Per Tube BID   feeding supplement (PROSource TF20)  60 mL Per Tube Daily   glycopyrrolate  1 mg Per Tube BID   hydrocortisone   Rectal BID   insulin aspart  0-15 Units Subcutaneous Q4H   mirtazapine  15 mg Per Tube QHS   multivitamin with minerals  1 tablet Per Tube Daily   mouth rinse  15 mL Mouth Rinse Q2H   polyethylene glycol  17 g Per Tube Daily   revefenacin  175 mcg Nebulization Daily   sodium chloride flush  10-40 mL Intracatheter Q12H   Continuous  Infusions:  sodium chloride Stopped (01/19/23 1328)   dexmedetomidine (PRECEDEX) IV  infusion 0.6 mcg/kg/hr (01/22/23 0823)   feeding supplement (OSMOLITE 1.5 CAL) 1,000 mL (01/22/23 0853)   fentaNYL infusion INTRAVENOUS 150 mcg/hr (01/22/23 1478)   heparin 1,200 Units/hr (01/22/23 0400)   norepinephrine (LEVOPHED) Adult infusion Stopped (01/21/23 1458)   piperacillin-tazobactam (ZOSYN)  IV 3.375 g (01/22/23 0522)   PRN Meds:.sodium chloride, acetaminophen **OR** acetaminophen, albuterol, docusate sodium, fentaNYL, gabapentin, ipratropium-albuterol, melatonin, metoprolol tartrate, midazolam, naLOXone (NARCAN)  injection, ondansetron (ZOFRAN) IV, mouth rinse, polyethylene glycol, sodium chloride flush Allergies  Allergen Reactions   Codeine Nausea And Vomiting and Other (See Comments)    Hallucinations   Prednisone Other (See Comments)    'Made me feel funny inside my head"-pt cannot be specific   CBC:    Component Value Date/Time   WBC 11.1 (H) 01/22/2023 0500   HGB 8.9 (L) 01/22/2023 0500   HGB 13.8 10/03/2022 1528   HCT 29.9 (L) 01/22/2023 0500   HCT 43.3 10/03/2022 1528   PLT 308 01/22/2023 0500   PLT 413 10/03/2022 1528   MCV 90.1 01/22/2023 0500   MCV 85 10/03/2022 1528   NEUTROABS 5.3 12/17/2020 1029   NEUTROABS 7.4 (H) 06/23/2020 1237   LYMPHSABS 1.3 12/17/2020 1029   LYMPHSABS 2.2 06/23/2020 1237   MONOABS 0.7 12/17/2020 1029   EOSABS 0.2 12/17/2020 1029   EOSABS 0.2 06/23/2020 1237   BASOSABS 0.1 12/17/2020 1029   BASOSABS 0.1 06/23/2020 1237   Comprehensive Metabolic Panel:    Component Value Date/Time   NA 138 01/22/2023 0500   NA 143 10/03/2022 1528   K 4.7 01/22/2023 0500   CL 102 01/22/2023 0500   CO2 27 01/22/2023 0500   BUN 25 (H) 01/22/2023 0500   BUN 14 10/03/2022 1528   CREATININE 0.92 01/22/2023 0500   CREATININE 1.05 (H) 08/18/2020 1348   CREATININE 0.95 02/10/2015 1526   GLUCOSE 171 (H) 01/22/2023 0500   CALCIUM 8.3 (L) 01/22/2023 0500   AST  15 01/11/2023 1719   AST 25 08/18/2020 1348   ALT 23 01/11/2023 1719   ALT 37 08/18/2020 1348   ALKPHOS 84 01/11/2023 1719   BILITOT 1.6 (H) 01/11/2023 1719   BILITOT 1.1 10/03/2022 1528   BILITOT 1.0 08/18/2020 1348   PROT 7.6 01/11/2023 1719   PROT 7.6 10/03/2022 1528   ALBUMIN 3.7 01/11/2023 1719   ALBUMIN 4.7 10/03/2022 1528    Physical Exam: Vital Signs: BP (!) 185/61   Pulse 79   Temp 97.6 F (36.4 C) (Axillary)   Resp (!) 21   Ht 5\' 8"  (1.727 m)   Wt 85.6 kg   SpO2 91%   BMI 28.69 kg/m  SpO2: SpO2: 91 % O2 Device: O2 Device: Room Air O2 Flow Rate: O2 Flow Rate (L/min): 40 L/min Intake/output summary:  Intake/Output Summary (Last 24 hours) at 01/22/2023 1032 Last data filed at 01/22/2023 0400 Gross per 24 hour  Intake 2205.72 ml  Output 700 ml  Net 1505.72 ml   LBM: Last BM Date : 01/21/23 Baseline Weight: Weight: 79.2 kg Most recent weight: Weight: 85.6 kg  General: alter, intubated, appropriately interacting to conversation Eyes: no drainage noted HENT: ET in place  Cardiovascular: RRR Respiratory: ET in placed on ventilator support  Skin: Ecchymoses on upper extremities Neuro: Appropriately nodding head yes and no to engage in conversation and moving extremities as instructed  Palliative Performance Scale: 20%              Additional Data Reviewed: Recent Labs    01/20/23 0534 01/21/23 0457  01/22/23 0500  WBC 15.9* 12.3* 11.1*  HGB 9.2* 9.2* 8.9*  PLT 300 314 308  NA 138  --  138  BUN 21  --  25*  CREATININE 0.97  --  0.92    Imaging: DG Chest Port 1 View CLINICAL DATA:  Acute respiratory failure  EXAM: PORTABLE CHEST 1 VIEW  COMPARISON:  None Available.  FINDINGS: No pneumothorax. The ETT is in good position. A right PICC line terminates in the SVC. The OG tube/NG tube terminates in the stomach. The cardiomediastinal silhouette is stable. Persistent opacity in the medial right lung base and and the left retrocardiac region, stable.  Small effusions stable. No other interval changes.  IMPRESSION: 1. Support apparatus as above. 2. Stable bibasilar opacities and small effusions. No other interval changes.  Electronically Signed   By: Gerome Sam III M.D.   On: 01/21/2023 09:50    I personally reviewed recent imaging.   Palliative Care Assessment and Plan Summary of Established Goals of Care and Medical Treatment Preferences   Patient is a 67 year old female with a past medical history of COPD, history of adenocarcinoma of the lung LLL, and panic/anxiety disorder, GERD, and depression who was admitted on 01/12/23 for nausea/vomiting, abdominal pain and diarrhea for 2 weeks.  Patient initially admitted to [facility].  Patient was found to have postobstructive pneumonia of the right middle/lower lobe with progressive respiratory failure which required intubation.  Had initially been DNR though elected for full code.  Imaging showed significant extrinsic compression of the airways in the right lower lobe and middle lobe which is likely malignant and was biopsied.  Palliative medicine team consulted to assist with complex medical decision making.  # Complex medical decision making/goals of care  -While present with patient participating (nodding appropriately and following commands), daughters at bedside, Dr. Vassie Loll, as well as this provider present, patient able to acknowledge wishes to remain full code. Patient has pain from ET tube so is hopeful to be extubated as soon as medically possible. Should patient's respiratory status deteriorate because she has a large lung mass, she is willing to be reintubated. Patient acknowledged willing to be on ventilator support long time though noted would need further conversations if that point arises because patient would still have cancer that could not be fixed especially in a bed bound status.   -Patient and daughter note that if patient is unable to make medical decisions for herself,  daughter [name] should be called. [name] informs her sister Mia about medical updates.   - Patient's dignity is very important to her. Things like daily baths would be greatly appreciate to assist in maintaining this. Patient also enjoys [employer] spiritual support.   -  Code Status: Full Code   # Symptom management  -As per primary team   # Psycho-social/Spiritual Support:  - Support System: daughters - Desire for further Chaplain support:yes, placed consult  # Discharge Planning:  To Be Determined  Thank you for allowing the palliative care team to participate in the care Sayaka B Nappier.  Alvester Morin, DO Palliative Care Provider PMT # [phone]  If patient remains symptomatic despite maximum doses, please call PMT at [phone] between 0700 and 1900. Outside of these hours, please call attending, as PMT does not have night coverage.  This provider spent a total of 80 minutes providing patient's care.  Includes review of EMR, discussing care with other staff members involved in patient's medical care, obtaining relevant history and information from patient  and/or patient's family, and personal review of imaging and lab work. Greater than 50% of the time was spent counseling and coordinating care related to the above assessment and plan.    *Please note that this is a verbal dictation therefore any spelling or grammatical errors are due to the "Dragon Medical One" system interpretation.

## 2023-01-22 NOTE — Progress Notes (Addendum)
NAME:  [name], MRN:  [id number], DOB:  08-17-1956, LOS: 10 ADMISSION DATE:  01/11/2023, CONSULTATION DATE:  01/16/2023 REFERRING MD: Family Practice , CHIEF COMPLAINT:  Acute Respiratory Failure in the setting of Lung Cancer, COPD and infiltrate on CXR   History of Present Illness:  [name] is a 67 y.o.female with a history of hx of COPD, h/o adenocarcinoma of LLL, (Biopsy was planned for 5/7) and panic/anxiety that was admitted for AHRF in setting of CAP/ COPD exacerbation to [facility] Teaching Service at [facility].   Presented to ED 01/12/2023 for N/V, abd pain, and diarrhea for 2 wks. found to have postobstructive pneumonia right middle/lower lobe with progressive respiratory failure requiring intubation 5/6 patient also had panic attacks/anxiety that exacerbated work of breathing and vocal cord dysfunction, leading to stridor.  Pt is a DNR, and she endorsed that she was not ready to die and wanted to be intubated. (See Prior Note documentation 01/16/2023)    Pertinent  Medical History   Past Medical History:  Diagnosis Date   Abnormal CT lung screening 07/10/2020   Anxiety    Anxiety state 02/28/2012   GAD7 : 6  PHQ9 : 6   Arthritis    Bladder cancer (HCC) 09/2020   urologist--- dr Berneice Heinrich, incidental finding on PET scan 12/ 2021,  s/p TURBT 09-18-2020 high grade papillary urothelial    Cancer related pain 03/11/2022   CAP (community acquired pneumonia) 04/20/2022   Centrilobular emphysema (HCC)    Chronic left shoulder pain 04/29/2021   Close exposure to COVID-19 virus 05/03/2019   Constipation 04/09/2019   Costochondritis, acute 08/14/2020   DDD (degenerative disc disease), lumbar    Depression    DOE (dyspnea on exertion)    10-29-2020  per pt can do house work without sob, when walks/ stairs stops frequently  (stated recovers quickly when stops)   Emphysema lung (HCC)    Epidermoid cyst 10/29/2018   Recurrent. Located on left hip at iliac crest 2" anterior to midaxillary line.  Removed 2013, 2020, 2022.    Epigastric pain 11/16/2020   Fibroid 2003   GERD (gastroesophageal reflux disease)    History of radiation therapy    Left lung- 10/06/20-10/16/20- Dr. Antony Blackbird   Leg cramping 11/13/2019   Lumbar spine pain 01/09/2015   Menopausal vaginal dryness 11/13/2019   Multiple closed fractures of ribs of left side 08/20/2022   Nocturnal enuresis 06/23/2020   Chronic overnight "leaking" of unknown source. Likely urine, but reports no color or odor. Volume enough for liners or light pads. Occurs every night.    Primary adenocarcinoma of lower lobe of left lung (HCC) 07/2020   oncologist--- dr Arbutus Ped---  dx 11/ 2021 bilateral lower lobe masses,  s/p bronchoscopy w/ bx's 07-28-2020 malignancy cells on left ;  completed SBRT bilateral lower lung 10-16-2020 5 fractions   Radiation-induced dermatitis    on 10-29-2020 pt complaint stomach and chest areas,  completed SBRT 10-16-2020   Seasonal allergies    Stage 3 severe COPD by GOLD classification [facility])    pulmonologist--- dr Tonia Brooms,  using anoro inhaler daily   Wears glasses      Significant Hospital Events: Including procedures, antibiotic start and stop dates in addition to other pertinent events   01/16/2023 Transferred to ICU for acute Respiratory Failure , intubated  5/7 bronch by Icard. significant extrinsic compression of the airways in the right lower lobe and middle lobe. This is likely malignant obstruction. Bx sent. Transferred to [facility]  for rad/onc eval. PICC placed 5/8 stable still high peep/fio2 needs. Rad/onc consult requested  5/10 tolerating SBT trial this a.m. on low-dose fentanyl and Precedex.  Propofol DC'd due to high triglycerides 5/11 tolerates SBT's, pressure support 10/5  Interim History / Subjective:   Critically ill, off Levophed Remains on Precedex and fentanyl Afebrile  Objective   Blood pressure (!) 185/61, pulse 79, temperature 97.6 F (36.4 C), temperature source Axillary, resp. rate  (!) 21, height 5\' 8"  (1.727 m), weight 85.6 kg, SpO2 91 %.    Vent Mode: PSV;CPAP FiO2 (%):  [40 %-50 %] 50 % Set Rate:  [14 bmp] 14 bmp Vt Set:  [510 mL] 510 mL PEEP:  [8 cmH20] 8 cmH20 Pressure Support:  [10 cmH20] 10 cmH20 Plateau Pressure:  [18 cmH20-21 cmH20] 18 cmH20   Intake/Output Summary (Last 24 hours) at 01/22/2023 1049 Last data filed at 01/22/2023 0400 Gross per 24 hour  Intake 2205.72 ml  Output 700 ml  Net 1505.72 ml    Filed Weights   01/19/23 0703 01/21/23 0500 01/22/23 0500  Weight: 78.3 kg 83.6 kg 85.6 kg    Examination: General: Acute on chronic ill-appearing middle-aged female lying in bed in no acute distress HEENT: ETT, MM pink/moist, PERRL,  Neuro: Awake, follows one-step commands CV: s1s2 regular rate and rhythm, no murmur, rubs, or gallops,  PULM: No accessory muscle use, decreased breath sounds right base GI: soft, bowel sounds active in all 4 quadrants, non-tender, non-distended, tolerating TF Extremities: warm/dry, no edema  Skin: no rashes or lesions  Chest x-ray 5/11 shows ET tube at carina, bilateral lower lobe consolidation Labs show normal electrolytes, decreased leukocytosis, stable anemia  Resolved Hospital Problem list   Hypertriglyceridemia due to propofol Hyponatremia Hypotension, Drug induced from sedation   Assessment & Plan:   Acute respiratory failure in setting of COPD, recurrent lung cancer w/ post obstructive atelectasis +/- post obstructive PNA stable effusion COPD P: Tolerating spontaneous breathing trials, pressure support 10/5. Would need to clarify reintubation status prior to extubation, ideally would like to complete few rounds of radiation before extubating to fix the primary problem which is postobstructive pneumonia due to cancer.  Daughter at this time desires reintubation VAP bundle in place  Budesonide Brovana and Yupelri combination Continue empiric Zosyn x 7ds   Sedation needs H/o VCD P: Postextubation  monitor closely may need steroids/racemic epi PAD protocol -Precedex and fentanyl drip but goal RASS 0 to -1, using Versed pushes for breakthrough   Paroxysmal atrial Fibrillation  -Anticoagulated at baseline with apixaban P: Continue IV heparin drip Use as needed beta-blockers for RVR if blood pressure tolerates Continuous telemetry  At risk for malnutrition  P: Continue tube feeds  Hyperglycemia P: Continue SSI CBG goal 140-180  CBG checks every 4 hours  Anemia P: Trend CBC Transfuse per protocol Hemoglobin goal greater than 7  Urinary retention likely 2/2 sedation P: Continue bethanechol Continue foley while intubated  Best Practice (right click and "Reselect all SmartList Selections" daily)   Diet/type: tubefeeds DVT prophylaxis: heparin gtt  GI prophylaxis: H2B Lines: Central line- picc Foley:  yes Code Status:  full code Last date of multidisciplinary goals of care discussion 5/11 discussion with daughters, they have unrealistic goal of taking mom home with a tracheostomy and vent if needed.  Desire full CODE STATUS and reintubation  CRITICAL CARE Performed by: Comer Locket. Libni Fusaro  Total critical care time: 35 minutes  Critical care time was exclusive of separately billable procedures and treating  other patients.  Critical care was necessary to treat or prevent imminent or life-threatening deterioration.  Critical care was time spent personally by me on the following activities: development of treatment plan with patient and/or surrogate as well as nursing, discussions with consultants, evaluation of patient's response to treatment, examination of patient, obtaining history from patient or surrogate, ordering and performing treatments and interventions, ordering and review of laboratory studies, ordering and review of radiographic studies, pulse oximetry and re-evaluation of patient's condition.  Cyril Mourning MD. Tonny Bollman. [facility] Pager :  [phone]  If no response to pager , please call [phone] until 7 pm After 7:00 pm call Elink  [phone]     01/22/2023, 10:49 AM

## 2023-01-22 NOTE — Progress Notes (Signed)
eLink Physician-Brief Progress Note Patient Name: [name] DOB: 1956/06/16 MRN: [id number]   Date of Service  01/22/2023  HPI/Events of Note  Received request for restraints Patient seen intubated and a risk for self harm by pulling lines and tubes  eICU Interventions  Bilateral soft wrist restraints renewed Bedside team to assess in am if restraints to be continued     Intervention Category Intermediate Interventions: Other:  Darl Pikes 01/22/2023, 12:50 AM

## 2023-01-22 NOTE — Progress Notes (Signed)
ANTICOAGULATION CONSULT NOTE - Follow Up Consult  Pharmacy Consult for Heparin IV Indication: atrial fibrillation  Allergies  Allergen Reactions   Codeine Nausea And Vomiting and Other (See Comments)    Hallucinations   Prednisone Other (See Comments)    'Made me feel funny inside my head"-pt cannot be specific    Patient Measurements: Height: 5\' 8"  (172.7 cm) Weight: 85.6 kg (188 lb 11.4 oz) IBW/kg (Calculated) : 63.9 Heparin Dosing Weight: 81mg   Vital Signs: Temp: 97.8 F (36.6 C) (05/12 0404) Temp Source: Axillary (05/12 0404) BP: 110/44 (05/12 0700) Pulse Rate: 58 (05/12 0700)  Labs: Recent Labs    01/19/23 1641 01/20/23 0043 01/20/23 0043 01/20/23 0534 01/20/23 0640 01/21/23 0457 01/21/23 1251 01/21/23 2135 01/22/23 0500  HGB  --   --    < > 9.2*  --  9.2*  --   --  8.9*  HCT  --   --   --  29.9*  --  30.0*  --   --  29.9*  PLT  --   --   --  300  --  314  --   --  308  APTT 127* 97*  --   --  80*  --   --   --   --   HEPARINUNFRC  --  0.61   < >  --  0.50 0.27* 0.24* 0.53 0.59  CREATININE  --   --   --  0.97  --   --   --   --  0.92   < > = values in this interval not displayed.     Estimated Creatinine Clearance: 68 mL/min (by C-G formula based on SCr of 0.92 mg/dL).   Medications:  Infusions:   sodium chloride Stopped (01/19/23 1328)   dexmedetomidine (PRECEDEX) IV infusion 1.2 mcg/kg/hr (01/22/23 0400)   feeding supplement (OSMOLITE 1.5 CAL) 55 mL/hr at 01/22/23 0400   fentaNYL infusion INTRAVENOUS 150 mcg/hr (01/22/23 1478)   heparin 1,200 Units/hr (01/22/23 0400)   norepinephrine (LEVOPHED) Adult infusion Stopped (01/21/23 1458)   piperacillin-tazobactam (ZOSYN)  IV 3.375 g (01/22/23 0522)    Assessment: 37 yoF with hx bladder/lung cancer, COPD who presented to the ED on 01/11/23 with poor oral intake, n/v and SOB. She was subsequently found to have postobstructive PNA and new onset afib with RVR. She was started in Eliquis for afib on 01/12/23,  but held after doses on 5/4, transitioned to heparin on 01/16/23 for bronch with biopsies 5/7, heparin resumed post procedure on 01/18/23 while she remains intubated.   Today, 01/22/2023: - Heparin level 0.59, remains therapeutic on heparin 1200 units/hr -CBC:  Hgb decreased to 8.9, Plt WNL - RN reports no bleeding, IV problems, or other complications.    Goal of Therapy:  Heparin level 0.3-0.7 units/ml aPTT 66-102 seconds Monitor platelets by anticoagulation protocol: Yes   Plan:  Continue heparin IV infusion at 1200 units/hr Daily heparin level, and CBC Follow up long-term anticoagulation plans   Lynann Beaver PharmD, BCPS [facility] main pharmacy [phone] 01/22/2023 7:45 AM

## 2023-01-23 ENCOUNTER — Other Ambulatory Visit: Payer: Self-pay

## 2023-01-23 ENCOUNTER — Ambulatory Visit: Payer: 59

## 2023-01-23 ENCOUNTER — Inpatient Hospital Stay (HOSPITAL_COMMUNITY): Payer: 59

## 2023-01-23 ENCOUNTER — Ambulatory Visit
Admission: RE | Admit: 2023-01-23 | Discharge: 2023-01-23 | Disposition: A | Discharge status: Home / Self Care | Payer: 59 | Source: Ambulatory Visit | Attending: Radiation Oncology | Admitting: Radiation Oncology

## 2023-01-23 ENCOUNTER — Telehealth: Payer: Self-pay | Admitting: Family Medicine

## 2023-01-23 DIAGNOSIS — J189 Pneumonia, unspecified organism: Secondary | ICD-10-CM | POA: Diagnosis not present

## 2023-01-23 DIAGNOSIS — C3432 Malignant neoplasm of lower lobe, left bronchus or lung: Secondary | ICD-10-CM | POA: Diagnosis not present

## 2023-01-23 DIAGNOSIS — R918 Other nonspecific abnormal finding of lung field: Secondary | ICD-10-CM

## 2023-01-23 DIAGNOSIS — Z515 Encounter for palliative care: Secondary | ICD-10-CM | POA: Diagnosis not present

## 2023-01-23 DIAGNOSIS — Z510 Encounter for antineoplastic radiation therapy: Secondary | ICD-10-CM | POA: Diagnosis not present

## 2023-01-23 DIAGNOSIS — J988 Other specified respiratory disorders: Secondary | ICD-10-CM

## 2023-01-23 DIAGNOSIS — Z7189 Other specified counseling: Secondary | ICD-10-CM

## 2023-01-23 DIAGNOSIS — J9601 Acute respiratory failure with hypoxia: Secondary | ICD-10-CM | POA: Diagnosis not present

## 2023-01-23 DIAGNOSIS — Z87891 Personal history of nicotine dependence: Secondary | ICD-10-CM | POA: Diagnosis not present

## 2023-01-23 LAB — RAD ONC ARIA SESSION SUMMARY
Course Elapsed Days: 5
Plan Fractions Treated to Date: 1
Plan Prescribed Dose Per Fraction: 2 Gy
Plan Total Fractions Prescribed: 27
Plan Total Prescribed Dose: 54 Gy
Reference Point Dosage Given to Date: 2 Gy
Reference Point Session Dosage Given: 2 Gy
Session Number: 4

## 2023-01-23 LAB — BASIC METABOLIC PANEL
Anion gap: 6 (ref 5–15)
BUN: 27 mg/dL — ABNORMAL HIGH (ref 8–23)
CO2: 28 mmol/L (ref 22–32)
Calcium: 8 mg/dL — ABNORMAL LOW (ref 8.9–10.3)
Chloride: 103 mmol/L (ref 98–111)
Creatinine, Ser: 0.91 mg/dL (ref 0.44–1.00)
GFR, Estimated: >60 mL/min (ref >60)
Glucose, Bld: 115 mg/dL — ABNORMAL HIGH (ref 70–99)
Potassium: 4.8 mmol/L (ref 3.5–5.1)
Sodium: 137 mmol/L (ref 135–145)

## 2023-01-23 LAB — CBC
HCT: 28.7 % — ABNORMAL LOW (ref 36.0–46.0)
Hemoglobin: 8.6 g/dL — ABNORMAL LOW (ref 12.0–15.0)
MCH: 27.1 pg (ref 26.0–34.0)
MCHC: 30 g/dL (ref 30.0–36.0)
MCV: 90.5 fL (ref 80.0–100.0)
Platelets: 297 10*3/uL (ref 150–400)
RBC: 3.17 MIL/uL — ABNORMAL LOW (ref 3.87–5.11)
RDW: 15.3 % (ref 11.5–15.5)
WBC: 10.4 10*3/uL (ref 4.0–10.5)
nRBC: 0 % (ref 0.0–0.2)

## 2023-01-23 LAB — GLUCOSE, CAPILLARY
Glucose-Capillary: 178 mg/dL — ABNORMAL HIGH (ref 70–99)
Glucose-Capillary: 148 mg/dL — ABNORMAL HIGH (ref 70–99)
Glucose-Capillary: 123 mg/dL — ABNORMAL HIGH (ref 70–99)
Glucose-Capillary: 125 mg/dL — ABNORMAL HIGH (ref 70–99)
Glucose-Capillary: 132 mg/dL — ABNORMAL HIGH (ref 70–99)
Glucose-Capillary: 139 mg/dL — ABNORMAL HIGH (ref 70–99)

## 2023-01-23 LAB — HEPARIN LEVEL (UNFRACTIONATED): Heparin Unfractionated: 0.56 IU/mL (ref 0.30–0.70)

## 2023-01-23 LAB — PHOSPHORUS: Phosphorus: 4.1 mg/dL (ref 2.5–4.6)

## 2023-01-23 LAB — MAGNESIUM: Magnesium: 2.3 mg/dL (ref 1.7–2.4)

## 2023-01-23 MED ORDER — FUROSEMIDE 10 MG/ML IJ SOLN
20.0000 mg | Freq: Once | INTRAMUSCULAR | Status: AC
  Administered 2023-01-23: 20 mg via INTRAVENOUS
  Filled 2023-01-23: qty 2

## 2023-01-23 MED ORDER — OXYCODONE HCL 5 MG PO TABS
5.0000 mg | ORAL_TABLET | Freq: Four times a day (QID) | ORAL | Status: DC
  Administered 2023-01-23 – 2023-01-25 (×8): 5 mg
  Filled 2023-01-23 (×8): qty 1

## 2023-01-23 NOTE — Telephone Encounter (Signed)
Appt has been cancelled.  

## 2023-01-23 NOTE — Progress Notes (Signed)
ANTICOAGULATION CONSULT NOTE - Follow Up Consult  Pharmacy Consult for Heparin IV Indication: atrial fibrillation  Allergies  Allergen Reactions   Codeine Nausea And Vomiting and Other (See Comments)    Hallucinations   Prednisone Other (See Comments)    'Made me feel funny inside my head"-pt cannot be specific    Patient Measurements: Height: 5\' 8"  (172.7 cm) Weight: 88.4 kg (194 lb 14.2 oz) IBW/kg (Calculated) : 63.9 Heparin Dosing Weight: 81mg   Vital Signs: Temp: 98.4 F (36.9 C) (05/13 0300) Temp Source: Axillary (05/13 0300) BP: 123/53 (05/13 0700) Pulse Rate: 68 (05/13 0700)  Labs: Recent Labs    01/21/23 0457 01/21/23 1251 01/21/23 2135 01/22/23 0500 01/23/23 0525  HGB 9.2*  --   --  8.9* 8.6*  HCT 30.0*  --   --  29.9* 28.7*  PLT 314  --   --  308 297  HEPARINUNFRC 0.27*   < > 0.53 0.59 0.56  CREATININE  --   --   --  0.92 0.91   < > = values in this interval not displayed.     Estimated Creatinine Clearance: 69.8 mL/min (by C-G formula based on SCr of 0.91 mg/dL).   Medications:  Infusions:   sodium chloride Stopped (01/19/23 1328)   dexmedetomidine (PRECEDEX) IV infusion 1.2 mcg/kg/hr (01/23/23 0332)   feeding supplement (OSMOLITE 1.5 CAL) 55 mL/hr at 01/23/23 0200   fentaNYL infusion INTRAVENOUS 175 mcg/hr (01/23/23 0200)   heparin 1,200 Units/hr (01/23/23 0200)   norepinephrine (LEVOPHED) Adult infusion Stopped (01/21/23 1458)   piperacillin-tazobactam (ZOSYN)  IV 3.375 g (01/23/23 0517)    Assessment: [name] with hx bladder/lung cancer, COPD who presented to the ED on 01/11/23 with poor oral intake, n/v and SOB. She was subsequently found to have postobstructive PNA and new onset afib with RVR. She was started in Eliquis for afib on 01/12/23, but held after doses on 5/4, transitioned to heparin on 01/16/23 for bronch with biopsies 5/7, heparin resumed post procedure on 01/18/23 while she remains intubated.   Today, 01/23/2023: - Heparin level 0.56,  remains therapeutic on heparin 1200 units/hr -CBC:  Hgb decreased to 8.6, Plt WNL - RN reports no bleeding, IV problems, or other complications.    Goal of Therapy:  Heparin level 0.3-0.7 units/ml aPTT 66-102 seconds Monitor platelets by anticoagulation protocol: Yes   Plan:  Continue heparin IV infusion at 1200 units/hr Daily heparin level, and CBC Follow up long-term anticoagulation plans   Lynann Beaver PharmD, BCPS [facility] main pharmacy [phone] 01/23/2023 7:19 AM

## 2023-01-23 NOTE — Progress Notes (Signed)
NAME:  [name], MRN:  [id number], DOB:  August 05, 1956, LOS: 11 ADMISSION DATE:  01/11/2023, CONSULTATION DATE:  01/16/2023 REFERRING MD: Family Practice , CHIEF COMPLAINT:  Acute Respiratory Failure in the setting of Lung Cancer, COPD and infiltrate on CXR   History of Present Illness:  [name] is a 67 y.o.female with a history of hx of COPD, h/o adenocarcinoma of LLL, (Biopsy was planned for 5/7) and panic/anxiety that was admitted for AHRF in setting of CAP/ COPD exacerbation to [facility] Teaching Service at [facility].   Presented to ED 01/12/2023 for N/V, abd pain, and diarrhea for 2 wks. found to have postobstructive pneumonia right middle/lower lobe with progressive respiratory failure requiring intubation 5/6 patient also had panic attacks/anxiety that exacerbated work of breathing and vocal cord dysfunction, leading to stridor.  Pt is a DNR, and she endorsed that she was not ready to die and wanted to be intubated. (See Prior Note documentation 01/16/2023)    Pertinent  Medical History   Past Medical History:  Diagnosis Date   Abnormal CT lung screening 07/10/2020   Anxiety    Anxiety state 02/28/2012   GAD7 : 6  PHQ9 : 6   Arthritis    Bladder cancer (HCC) 09/2020   urologist--- dr Berneice Heinrich, incidental finding on PET scan 12/ 2021,  s/p TURBT 09-18-2020 high grade papillary urothelial    Cancer related pain 03/11/2022   CAP (community acquired pneumonia) 04/20/2022   Centrilobular emphysema (HCC)    Chronic left shoulder pain 04/29/2021   Close exposure to COVID-19 virus 05/03/2019   Constipation 04/09/2019   Costochondritis, acute 08/14/2020   DDD (degenerative disc disease), lumbar    Depression    DOE (dyspnea on exertion)    10-29-2020  per pt can do house work without sob, when walks/ stairs stops frequently  (stated recovers quickly when stops)   Emphysema lung (HCC)    Epidermoid cyst 10/29/2018   Recurrent. Located on left hip at iliac crest 2" anterior to midaxillary line.  Removed 2013, 2020, 2022.    Epigastric pain 11/16/2020   Fibroid 2003   GERD (gastroesophageal reflux disease)    History of radiation therapy    Left lung- 10/06/20-10/16/20- Dr. Antony Blackbird   Leg cramping 11/13/2019   Lumbar spine pain 01/09/2015   Menopausal vaginal dryness 11/13/2019   Multiple closed fractures of ribs of left side 08/20/2022   Nocturnal enuresis 06/23/2020   Chronic overnight "leaking" of unknown source. Likely urine, but reports no color or odor. Volume enough for liners or light pads. Occurs every night.    Primary adenocarcinoma of lower lobe of left lung (HCC) 07/2020   oncologist--- dr Arbutus Ped---  dx 11/ 2021 bilateral lower lobe masses,  s/p bronchoscopy w/ bx's 07-28-2020 malignancy cells on left ;  completed SBRT bilateral lower lung 10-16-2020 5 fractions   Radiation-induced dermatitis    on 10-29-2020 pt complaint stomach and chest areas,  completed SBRT 10-16-2020   Seasonal allergies    Stage 3 severe COPD by GOLD classification [facility])    pulmonologist--- dr Tonia Brooms,  using anoro inhaler daily   Wears glasses      Significant Hospital Events: Including procedures, antibiotic start and stop dates in addition to other pertinent events   01/16/2023 Transferred to ICU for acute Respiratory Failure , intubated  5/7 bronch by Icard. significant extrinsic compression of the airways in the right lower lobe and middle lobe. This is likely malignant obstruction. Bx sent. Transferred to [facility]  for rad/onc eval. PICC placed 5/8 stable still high peep/fio2 needs. Rad/onc consult requested  5/10 tolerating SBT trial this a.m. on low-dose fentanyl and Precedex.  Propofol DC'd due to high triglycerides 5/11 tolerates SBT's, pressure support 10/5  Interim History / Subjective:   RASS -1 HD stable PRVC this morning Radiation tx pending later today Afebrile 11L positive for the admission   Objective   Blood pressure (!) 137/58, pulse 74, temperature 98.3 F (36.8  C), temperature source Axillary, resp. rate 18, height 5\' 8"  (1.727 m), weight 88.4 kg, SpO2 97 %.    Vent Mode: PRVC FiO2 (%):  [40 %] 40 % Set Rate:  [14 bmp] 14 bmp Vt Set:  [510 mL] 510 mL PEEP:  [8 cmH20] 8 cmH20 Plateau Pressure:  [18 cmH20-21 cmH20] 18 cmH20   Intake/Output Summary (Last 24 hours) at 01/23/2023 0847 Last data filed at 01/23/2023 0801 Gross per 24 hour  Intake 3438.7 ml  Output 825 ml  Net 2613.7 ml    Filed Weights   01/21/23 0500 01/22/23 0500 01/23/23 0500  Weight: 83.6 kg 85.6 kg 88.4 kg    Examination:  General: Critically ill appearing female in NAD on vent HEENT: [place]/AT, PERRL, ETT Neuro: Alert to verbal. Briefly interactes. RASS -1.  CV: RRR, no MRG PULM: Diminished R base, otherwise clear.  GI: Soft, NT, ND, hypoactive.  Extremities: No acute deformity, no significant edema Skin: Grossly intact   Chest x-ray 5/11 shows ETT in good position. RLL infiltrate stable.  Na 137, K 4.8, BUN 24, Creat 0.91, Hemoglobin 8.6 slowly down -trending  Resolved Hospital Problem list   Hypertriglyceridemia due to propofol Hyponatremia Hypotension, Drug induced from sedation   Assessment & Plan:   Acute respiratory failure in setting of COPD, recurrent lung cancer w/ post obstructive atelectasis +/- post obstructive PNA stable effusion COPD P: Has been tolerating SBT, will attempt after radiation today.  Palliative following.  Should an extubation attempt be unsuccessful, she would want to be re-intubated.  VAP bundle in place  Budesonide, Brovana, and Yupelri combination Continue empiric Zosyn x 7ds (stop 5/14) Will trial some gentle diuretic today.   Sedation needs H/o VCD P: Postextubation monitor closely may need steroids/racemic epi PAD protocol -Precedex and fentanyl drip but goal RASS 0 to -1, using Versed pushes for breakthrough  Paroxysmal atrial Fibrillation  -Anticoagulated at baseline with apixaban P: Heparin infusion PRN  beta-blocker Tele monitoring  At risk for malnutrition  P: Tube feeds  Hyperglycemia P: CBG monitoring and SSI q 4 hours   Anemia P: Trend CBC Slow hemoglobin drift ? Hemodilutaion. 11L positive. Trial lasix.   Urinary retention likely 2/2 sedation P: Continue bethanechol Continue foley while intubated  Best Practice (right click and "Reselect all SmartList Selections" daily)   Diet/type: tubefeeds DVT prophylaxis: heparin gtt  GI prophylaxis: H2B Lines: Central line- picc Foley:  yes Code Status:  full code Last date of multidisciplinary goals of care discussion 5/11 discussion with daughters, they have unrealistic goal of taking mom home with a tracheostomy and vent if needed.  Desire full CODE STATUS and reintubation  Critical care time 42 minutes  Joneen Roach, AGACNP-BC [facility]  See Amion for personal pager PCCM on call pager [phone] until 7pm. Please call Elink 7p-7a. [phone]  01/23/2023 8:51 AM

## 2023-01-23 NOTE — Telephone Encounter (Signed)
Called daughter to discuss updates to Ms. Reisner's care and provide support.   Fayette Pho, MD

## 2023-01-23 NOTE — Progress Notes (Signed)
PT Cancellation Note  Patient Details Name: [name] MRN: [id number] DOB: 12/17/1955   Cancelled Treatment:    Reason Eval/Treat Not Completed: Patient at procedure or test/unavailable when PT checked. Will check back another time. Blanchard Kelch PT Acute Rehabilitation Services Office [phone] Weekend [phone]    Rada Hay 01/23/2023, 12:50 PM

## 2023-01-23 NOTE — Progress Notes (Signed)
Daily Progress Note   Patient Name: [name]       Date: 01/23/2023 DOB: May 27, 1956  Age: 67 y.o. MRN#: [id number] Attending Physician: Martina Sinner, MD Primary Care Physician: Fayette Pho, MD Admit Date: 01/11/2023 Length of Stay: 11 days  Reason for Consultation/Follow-up: Establishing goals of care  Subjective:   CC: Patient remains intubated. Awake, alert and noting pain. Following up regarding complex medical decision making.   Subjective:  Reviewed EMR prior to presenting to bedside.  Patient went for radiation this morning.  Presented to bedside to check on patient.  Patient remains intubated on ventilator support.  Patient is very alert, awake, and appropriately interacting with questions.  Patient is anxious from pain and can point to tube in her throat causing this pain.  Continue to provide as needed pain medications while PCCM provider awaiting stability for optimal extubation.  Had discussed with patient and daughters yesterday that should patient's respiratory status deteriorate, patient would want to be re intubated. No family present at bedside today during visit.  Spent time providing emotional support as able for patient.  Also discussed visit with chaplain this morning to support patient.  Discussed care with bedside RN after visit.  Palliative medicine team will continue to follow along with patient's care.  Review of Systems Notes pain from ET tube  Objective:   Vital Signs:  BP (!) 137/58   Pulse 74   Temp 98.3 F (36.8 C) (Axillary)   Resp 18   Ht 5\' 8"  (1.727 m)   Wt 88.4 kg   SpO2 97%   BMI 29.63 kg/m   Physical Exam: General: alter, intubated, appropriately interacting to conversation Eyes: no drainage noted HENT: ET in place  Cardiovascular: RRR Respiratory: ET in place on ventilator support  Skin: Ecchymoses on upper extremities Neuro: Appropriately engaging with questions Psych: appears anxious, notes pain   Imaging:  I  personally reviewed recent imaging.   Assessment & Plan:   Assessment: Patient is a 67 year old female with a past medical history of COPD, history of adenocarcinoma of the lung LLL, and panic/anxiety disorder, GERD, and depression who was admitted on 01/12/23 for nausea/vomiting, abdominal pain and diarrhea for 2 weeks.  Patient initially admitted to [facility].  Patient was found to have postobstructive pneumonia of the right middle/lower lobe with progressive respiratory failure which required intubation.  Had initially been DNR though elected for full code.  Imaging showed significant extrinsic compression of the airways in the right lower lobe and middle lobe which is likely malignant and was biopsied.  Palliative medicine team consulted to assist with complex medical decision making.   Recommendations/Plan: # Complex medical decision making/goals of care:       -Provided emotional support for patient today. Had discussed with Dr. Vassie Loll, patient, patient's 2 daughters, and this present on 01/22/23 that patient wishes to remain full code/full scope of care. Patient willing to be re intubated if respiratory status worsens after extubation. Patient had agreed to trach if needed though would encourage further discussion if that need arises due to patient's underlying diagnosis of progressive cancer, not getting systemic therapy due to functional status and receiving radiation in a palliative manner.                 -Patient and daughter agreed on 5/12 that if patient is unable to make medical decisions for herself, daughter [name] should be called. [name] informs her sister Mia about medical updates.                 -  Patient's dignity is very important to her. Things like daily baths would be greatly appreciate to assist in maintaining this. Patient also enjoys [employer] spiritual support.                 -  Code Status: Full Code    # Symptom management                -As per primary team     # Psycho-social/Spiritual Support:  - Support System: daughters - Desire for further Chaplain support:yes, placed consult. Discussed with chaplain today about visiting.   # Discharge Planning: To Be Determined  Discussed with: patient, bedside RN  Thank you for allowing the palliative care team to participate in the care Aarionna B Cifuentes.  Alvester Morin, DO Palliative Care Provider PMT # [phone]  If patient remains symptomatic despite maximum doses, please call PMT at [phone] between 0700 and 1900. Outside of these hours, please call attending, as PMT does not have night coverage.  This provider spent a total of 35 minutes providing patient's care.  Includes review of EMR, discussing care with other staff members involved in patient's medical care, obtaining relevant history and information from patient and/or patient's family, and personal review of imaging and lab work. Greater than 50% of the time was spent counseling and coordinating care related to the above assessment and plan.    *Please note that this is a verbal dictation therefore any spelling or grammatical errors are due to the "Dragon Medical One" system interpretation.

## 2023-01-23 NOTE — Progress Notes (Signed)
Chaplain received a consult to provide support to [place] and family.  Chaplain introduced self to [name] and [name] nodded to show understanding.  Chaplain asked yes/no questions to assess for spiritual needs, but she was not able to communicate. Chaplain remained with her offering supportive presence for several minutes. Chaplain will continue to provide support to [name] and her family, but please also page as needs arise.  [address], Bcc Pager, [phone]

## 2023-01-23 NOTE — Progress Notes (Signed)
OT Cancellation Note  Patient Details Name: [name] MRN: [id number] DOB: 1956/06/18   Cancelled Treatment:    Reason Eval/Treat Not Completed: Patient at procedure or test/ unavailable Patient is off the hall at radiology. OT to continue to follow and check back as schedule will allow.  Rosalio Loud, MS Acute Rehabilitation Department Office# [phone]  01/23/2023, 10:10 AM

## 2023-01-23 NOTE — Progress Notes (Signed)
PCCM INTERVAL PROGRESS NOTE  Not tolerating SBT today due to increased respiratory rate. C/o ETT related pain. Difficult to discern how much pain/anxiety are contributing to SBT failure. Currently on Precedex 1 during SBT. Fentanyl off.   Will start enteral oxycodone scheduled to hopefully mitigate discomfort and facilitate clearer weaning trials.  Will also give time for additional radiation treatment to the obstructing lesion.   Joneen Roach, AGACNP-BC [facility]  See Amion for personal pager PCCM on call pager [phone] until 7pm. Please call Elink 7p-7a. [phone]  01/23/2023 11:53 AM

## 2023-01-24 ENCOUNTER — Ambulatory Visit
Admission: RE | Admit: 2023-01-24 | Discharge: 2023-01-24 | Disposition: A | Discharge status: Home / Self Care | Payer: 59 | Source: Ambulatory Visit | Attending: Radiation Oncology | Admitting: Radiation Oncology

## 2023-01-24 ENCOUNTER — Other Ambulatory Visit: Payer: Self-pay

## 2023-01-24 ENCOUNTER — Ambulatory Visit: Payer: 59

## 2023-01-24 DIAGNOSIS — J383 Other diseases of vocal cords: Secondary | ICD-10-CM | POA: Diagnosis not present

## 2023-01-24 DIAGNOSIS — C3432 Malignant neoplasm of lower lobe, left bronchus or lung: Secondary | ICD-10-CM | POA: Diagnosis not present

## 2023-01-24 DIAGNOSIS — Z87891 Personal history of nicotine dependence: Secondary | ICD-10-CM | POA: Diagnosis not present

## 2023-01-24 DIAGNOSIS — J988 Other specified respiratory disorders: Secondary | ICD-10-CM | POA: Diagnosis not present

## 2023-01-24 DIAGNOSIS — J9601 Acute respiratory failure with hypoxia: Secondary | ICD-10-CM | POA: Diagnosis not present

## 2023-01-24 DIAGNOSIS — Z510 Encounter for antineoplastic radiation therapy: Secondary | ICD-10-CM | POA: Diagnosis not present

## 2023-01-24 DIAGNOSIS — J189 Pneumonia, unspecified organism: Secondary | ICD-10-CM | POA: Diagnosis not present

## 2023-01-24 LAB — RAD ONC ARIA SESSION SUMMARY
Course Elapsed Days: 6
Plan Fractions Treated to Date: 2
Plan Prescribed Dose Per Fraction: 2 Gy
Plan Total Fractions Prescribed: 27
Plan Total Prescribed Dose: 54 Gy
Reference Point Dosage Given to Date: 4 Gy
Reference Point Session Dosage Given: 2 Gy
Session Number: 5

## 2023-01-24 LAB — COMPREHENSIVE METABOLIC PANEL
ALT: 50 U/L — ABNORMAL HIGH (ref 0–44)
AST: 25 U/L (ref 15–41)
Albumin: 2 g/dL — ABNORMAL LOW (ref 3.5–5.0)
Alkaline Phosphatase: 65 U/L (ref 38–126)
Anion gap: 7 (ref 5–15)
BUN: 28 mg/dL — ABNORMAL HIGH (ref 8–23)
CO2: 26 mmol/L (ref 22–32)
Calcium: 7.1 mg/dL — ABNORMAL LOW (ref 8.9–10.3)
Chloride: 104 mmol/L (ref 98–111)
Creatinine, Ser: 0.84 mg/dL (ref 0.44–1.00)
GFR, Estimated: >60 mL/min (ref >60)
Glucose, Bld: 146 mg/dL — ABNORMAL HIGH (ref 70–99)
Potassium: 4.2 mmol/L (ref 3.5–5.1)
Sodium: 137 mmol/L (ref 135–145)
Total Bilirubin: 0.3 mg/dL (ref 0.3–1.2)
Total Protein: 5.7 g/dL — ABNORMAL LOW (ref 6.5–8.1)

## 2023-01-24 LAB — CBC
HCT: 26.5 % — ABNORMAL LOW (ref 36.0–46.0)
Hemoglobin: 7.8 g/dL — ABNORMAL LOW (ref 12.0–15.0)
MCH: 26.8 pg (ref 26.0–34.0)
MCHC: 29.4 g/dL — ABNORMAL LOW (ref 30.0–36.0)
MCV: 91.1 fL (ref 80.0–100.0)
Platelets: 301 10*3/uL (ref 150–400)
RBC: 2.91 MIL/uL — ABNORMAL LOW (ref 3.87–5.11)
RDW: 15.2 % (ref 11.5–15.5)
WBC: 9.6 10*3/uL (ref 4.0–10.5)
nRBC: 0 % (ref 0.0–0.2)

## 2023-01-24 LAB — GLUCOSE, CAPILLARY
Glucose-Capillary: 133 mg/dL — ABNORMAL HIGH (ref 70–99)
Glucose-Capillary: 143 mg/dL — ABNORMAL HIGH (ref 70–99)
Glucose-Capillary: 154 mg/dL — ABNORMAL HIGH (ref 70–99)
Glucose-Capillary: 154 mg/dL — ABNORMAL HIGH (ref 70–99)
Glucose-Capillary: 155 mg/dL — ABNORMAL HIGH (ref 70–99)
Glucose-Capillary: 156 mg/dL — ABNORMAL HIGH (ref 70–99)
Glucose-Capillary: 94 mg/dL (ref 70–99)

## 2023-01-24 LAB — HEPARIN LEVEL (UNFRACTIONATED): Heparin Unfractionated: 0.32 IU/mL (ref 0.30–0.70)

## 2023-01-24 LAB — PHOSPHORUS: Phosphorus: 4.2 mg/dL (ref 2.5–4.6)

## 2023-01-24 LAB — MAGNESIUM: Magnesium: 2.2 mg/dL (ref 1.7–2.4)

## 2023-01-24 MED ORDER — FUROSEMIDE 10 MG/ML IJ SOLN
40.0000 mg | Freq: Four times a day (QID) | INTRAMUSCULAR | Status: AC
  Administered 2023-01-24 (×2): 40 mg via INTRAVENOUS
  Filled 2023-01-24 (×2): qty 4

## 2023-01-24 MED ORDER — POTASSIUM CHLORIDE 20 MEQ PO PACK
40.0000 meq | PACK | Freq: Once | ORAL | Status: AC
  Administered 2023-01-24: 40 meq
  Filled 2023-01-24: qty 2

## 2023-01-24 NOTE — Progress Notes (Signed)
PT Cancellation Note  Patient Details Name: ELEFTHERIA WEAVIL MRN: [id number] DOB: 1956/07/21   Cancelled Treatment:    Reason Eval/Treat Not Completed: Medical issues which prohibited therapy. PT will sign off.  Patient is sedated and on ventilator. Please reorder if patient  is able to participate. Blanchard Kelch PT Acute Rehabilitation Services Office [phone] Weekend pager-[phone]'   Rada Hay 01/24/2023, 8:14 AM

## 2023-01-24 NOTE — Progress Notes (Signed)
Nutrition Follow-up  DOCUMENTATION CODES:   Severe malnutrition in context of acute illness/injury  INTERVENTION:  - Continue goal TF regimne: Osmolite 1.5 @ 55 ml/hr (1320 ml/day) PROSource TF20 60 ml daily - Provides 2060 kcal, 103 grams of protein, and 1006 ml of H2O.   - MVI with minerals daily per tube.  - Monitor weight trends.   NUTRITION DIAGNOSIS:   Severe Malnutrition related to acute illness as evidenced by moderate fat depletion, moderate muscle depletion. *ongoing  GOAL:   Patient will meet greater than or equal to 90% of their needs *met with TF  MONITOR:   Vent status, Labs, Weight trends, TF tolerance  REASON FOR ASSESSMENT:   Ventilator, Consult Enteral/tube feeding initiation and management  ASSESSMENT:   67 y.o. female with PMHx including adenocarcinoma of LLL now with recurrence, COPD, and anxiety presents with acute on chronic hypoxemic respiratory failure likely multifactorial  5/2 Admit 5/6 Intubated; Osm 1.5 started at 25mL/hr 5/8 Osm 1.5 increased to goal of 61mL/hr   Patient remains intubated.  RN reports tube feeds continue to go well. Notes she has been having loose stools but this is unchanged.  SBT attempted again today but patient failed again. She continues on palliative radiation.   Admit weight: 174# Current weight: 181# Weights continue to fluctuate. Patient experiencing edema and is +12L which is likely affecting weight status.   Medications reviewed and include: Colace, Miralax, Insulin, Remeron, MVI Fentanyl  Labs reviewed:  HA1C 6.0 Blood Glucose 123-155 x24 hours   Diet Order:   Diet Order             Diet NPO time specified  Diet effective now                   EDUCATION NEEDS:  Education needs have been addressed  Skin:  Skin Assessment: Reviewed RN Assessment  Last BM:  5/9  Height:  Ht Readings from Last 1 Encounters:  01/17/23 5\' 8"  (1.727 m)   Weight:  Wt Readings from Last 1  Encounters:  01/24/23 82.1 kg    BMI:  Body mass index is 27.52 kg/m.  Estimated Nutritional Needs:  Kcal:  1900-2100 Protein:  95-120 g Fluid:  >1.9 L    Shelle Iron RD, LDN For contact information, refer to [facility].

## 2023-01-24 NOTE — Progress Notes (Signed)
NAME:  [name], MRN:  [id number], DOB:  02-Feb-1956, LOS: 12 ADMISSION DATE:  01/11/2023, CONSULTATION DATE:  01/16/2023 REFERRING MD: Family Practice , CHIEF COMPLAINT:  Acute Respiratory Failure in the setting of Lung Cancer, COPD and infiltrate on CXR   History of Present Illness:  [name] is a 67 y.o.female with a history of hx of COPD, h/o adenocarcinoma of LLL, (Biopsy was planned for 5/7) and panic/anxiety that was admitted for AHRF in setting of CAP/ COPD exacerbation to [facility] at [facility].   Presented to ED 01/12/2023 for N/V, abd pain, and diarrhea for 2 wks. found to have postobstructive pneumonia right middle/lower lobe with progressive respiratory failure requiring intubation 5/6 patient also had panic attacks/anxiety that exacerbated work of breathing and vocal cord dysfunction, leading to stridor.  Pt is a DNR, and she endorsed that she was not ready to die and wanted to be intubated. (See Prior Note documentation 01/16/2023)   Pertinent  Medical History   has a past medical history of Abnormal CT lung screening (07/10/2020), Anxiety, Anxiety state (02/28/2012), Arthritis, Bladder cancer (HCC) (09/2020), Cancer related pain (03/11/2022), CAP (community acquired pneumonia) (04/20/2022), Centrilobular emphysema (HCC), Chronic left shoulder pain (04/29/2021), Close exposure to COVID-19 virus (05/03/2019), Constipation (04/09/2019), Costochondritis, acute (08/14/2020), DDD (degenerative disc disease), lumbar, Depression, DOE (dyspnea on exertion), Emphysema lung (HCC), Epidermoid cyst (10/29/2018), Epigastric pain (11/16/2020), Fibroid (2003), GERD (gastroesophageal reflux disease), History of radiation therapy, Leg cramping (11/13/2019), Lumbar spine pain (01/09/2015), Menopausal vaginal dryness (11/13/2019), Multiple closed fractures of ribs of left side (08/20/2022), Nocturnal enuresis (06/23/2020), Primary adenocarcinoma of lower lobe of left lung (HCC) (07/2020),  Radiation-induced dermatitis, Seasonal allergies, Stage 3 severe COPD by GOLD classification (HCC), and Wears glasses.  Significant Hospital Events: Including procedures, antibiotic start and stop dates in addition to other pertinent events   01/16/2023 Transferred to ICU for acute Respiratory Failure , intubated  5/7 bronch by Icard. significant extrinsic compression of the airways in the right lower lobe and middle lobe. This is likely malignant obstruction. Bx sent. Transferred to [facility] for rad/onc eval. PICC placed 5/8 stable still high peep/fio2 needs. Rad/onc consult requested  5/10 tolerating SBT trial this a.m. on low-dose fentanyl and Precedex.  Propofol DC'd due to high triglycerides 5/11 tolerates SBT's, pressure support 10/5  Interim History / Subjective:   No acute event overnight Remains sedated on vent Weaned for about an hour, failed due to tachypnea PEEP weaned to 5  Objective   Blood pressure 136/60, pulse 67, temperature 98.4 F (36.9 C), temperature source Axillary, resp. rate 17, height 5\' 8"  (1.727 m), weight 88.4 kg, SpO2 97 %.    Vent Mode: PRVC FiO2 (%):  [40 %] 40 % Set Rate:  [14 bmp] 14 bmp Vt Set:  [510 mL] 510 mL PEEP:  [8 cmH20] 8 cmH20 Pressure Support:  [10 cmH20] 10 cmH20 Plateau Pressure:  [17 cmH20-27 cmH20] 17 cmH20   Intake/Output Summary (Last 24 hours) at 01/24/2023 0740 Last data filed at 01/24/2023 0714 Gross per 24 hour  Intake 2881.21 ml  Output 1900 ml  Net 981.21 ml    Filed Weights   01/21/23 0500 01/22/23 0500 01/23/23 0500  Weight: 83.6 kg 85.6 kg 88.4 kg    Examination:  General: Adult female on vent HEENT: [place]/AT, PERRL, ETT Neuro: Sedated this morning RASS -3 CV: RRR, no MRG PULM: Diminished R base GI: Soft, NT, ND Extremities: no deformity or edema.  Skin: Grossly intact   Chest  x-ray 5/11 shows ETT in good position. RLL infiltrate stable.  Na 137, K 4.2, BUN 26, Creat 0.84, Hemoglobin 7.8 slowly  down-trending  Resolved Hospital Problem list   Hypertriglyceridemia due to propofol Hyponatremia Hypotension, Drug induced from sedation   Assessment & Plan:   Acute respiratory failure in setting of COPD, recurrent lung cancer w/ post obstructive atelectasis +/- post obstructive PNA. Intubated 5/7,  Zosyn completed 5/13 Pulmonary edema Small bilateral pleural effusions. stable COPD P: - Will attempt SBT after radiation today.  - Palliative following.  Should an extubation attempt be unsuccessful, she would want to be re-intubated.  - VAP bundle in place  - Budesonide, Brovana, and Yupelri combination - Lasix 40mg  x 2 - remains markedly volume up.  - Postextubation monitor closely may need steroids/racemic epi - PAD protocol -Precedex and fentanyl drip but goal RASS 0 to -1, using Versed pushes for breakthrough - Scheduled oxycodone added 5/13 - Consider scheduled BZD  Paroxysmal atrial fibrillation  -Anticoagulated at baseline with apixaban P: - Heparin infusion - PRN beta-blocker - Tele monitoring  At risk for malnutrition  P: - Tube feeds  Hyperglycemia P: - CBG monitoring and SSI q 4 hours   Anemia P: - Trend CBC - Slow hemoglobin drift ? Hemodilutaion. 12 L positive. Trial lasix.   Urinary retention likely 2/2 sedation P: - Continue bethanechol - DC foley today  Best Practice (right click and "Reselect all SmartList Selections" daily)   Diet/type: tubefeeds DVT prophylaxis: heparin gtt GI prophylaxis: H2B Lines: Central line- picc Foley:  yes - DC today Code Status:  full code Last date of multidisciplinary goals of care discussion 5/11 discussion with daughters, they have unrealistic goal of taking mom home with a tracheostomy and vent if needed.  Desire full CODE STATUS and reintubation  Critical care time 38 minutes  Joneen Roach, AGACNP-BC  Pulmonary & Critical Care  See Amion for personal pager PCCM on call pager [phone] until  7pm. Please call Elink 7p-7a. [phone]  01/24/2023 7:40 AM

## 2023-01-24 NOTE — TOC Progression Note (Signed)
Transition of Care [facility]) - Progression Note    Patient Details  Name: [name] MRN: [id number] Date of Birth: 02/23/1956  Transition of Care [facility]) CM/SW Contact  Coralyn Helling, [place] Phone Number: 01/24/2023, 12:03 PM  Clinical Narrative:    TOC to follow for needs     Barriers to Discharge: Continued Medical Work up  Expected Discharge Plan and Services         Expected Discharge Date: 01/14/23                                     Social Determinants of Health (SDOH) Interventions SDOH Screenings   Food Insecurity: No Food Insecurity (01/13/2023)  Housing: Low Risk  (01/13/2023)  Transportation Needs: No Transportation Needs (01/13/2023)  Utilities: Not At Risk (01/13/2023)  Alcohol Screen: Low Risk  (12/13/2022)  Depression (PHQ2-9): Low Risk  (12/13/2022)  Financial Resource Strain: Low Risk  (12/13/2022)  Physical Activity: Inactive (12/13/2022)  Social Connections: Moderately Integrated (12/13/2022)  Stress: No Stress Concern Present (12/13/2022)  Tobacco Use: Medium Risk (01/22/2023)    Readmission Risk Interventions     No data to display

## 2023-01-24 NOTE — Progress Notes (Signed)
OT Cancellation Note  Patient Details Name: [name] MRN: [id number] DOB: 1956/04/16   Cancelled Treatment:    Reason Eval/Treat Not Completed: Other (comment) Nurse reporting patient has increased anxiety with pending radiation treatment this AM. Nurse requesting therapy hold for now. OT to continue to follow and check back as schedule will allow.  Rosalio Loud, MS Acute Rehabilitation Department Office# [phone]  01/24/2023, 8:09 AM

## 2023-01-24 NOTE — Progress Notes (Signed)
ANTICOAGULATION CONSULT NOTE - Follow Up Consult  Pharmacy Consult for Heparin IV Indication: atrial fibrillation  Allergies  Allergen Reactions   Codeine Nausea And Vomiting and Other (See Comments)    Hallucinations   Prednisone Other (See Comments)    'Made me feel funny inside my head"-pt cannot be specific    Patient Measurements: Height: 5\' 8"  (172.7 cm) Weight: 88.4 kg (194 lb 14.2 oz) IBW/kg (Calculated) : 63.9 Heparin Dosing Weight: 81mg   Vital Signs: Temp: 98.4 F (36.9 C) (05/14 0405) Temp Source: Axillary (05/14 0405) BP: 136/60 (05/14 0500) Pulse Rate: 68 (05/14 0500)  Labs: Recent Labs    01/22/23 0500 01/23/23 0525 01/24/23 0515  HGB 8.9* 8.6* 7.8*  HCT 29.9* 28.7* 26.5*  PLT 308 297 301  HEPARINUNFRC 0.59 0.56 0.32  CREATININE 0.92 0.91 0.84     Estimated Creatinine Clearance: 75.6 mL/min (by C-G formula based on SCr of 0.84 mg/dL).   Medications:  Infusions:   sodium chloride Stopped (01/19/23 1328)   dexmedetomidine (PRECEDEX) IV infusion 1 mcg/kg/hr (01/24/23 0600)   feeding supplement (OSMOLITE 1.5 CAL) 55 mL/hr at 01/24/23 0600   fentaNYL infusion INTRAVENOUS 175 mcg/hr (01/24/23 0600)   heparin 1,200 Units/hr (01/24/23 0600)   norepinephrine (LEVOPHED) Adult infusion Stopped (01/21/23 1458)    Assessment: 10 yoF with hx bladder/lung cancer, COPD who presented to the ED on 01/11/23 with poor oral intake, n/v and SOB. She was subsequently found to have postobstructive PNA and new onset afib with RVR. She was started in Eliquis for afib on 01/12/23, but held after doses on 5/4, transitioned to heparin on 01/16/23 for bronch with biopsies 5/7, heparin resumed post procedure on 01/18/23 while she remains intubated.   Today, 01/24/2023: - Heparin level 0.32, remains therapeutic on heparin 1200 units/hr -CBC:  Hgb decreased to 7.8, Plt WNL - RN reports no bleeding, IV problems, or other complications.    Goal of Therapy:  Heparin level 0.3-0.7  units/ml aPTT 66-102 seconds Monitor platelets by anticoagulation protocol: Yes   Plan:  Continue heparin IV infusion at 1200 units/hr Daily heparin level, and CBC Follow up long-term anticoagulation plans   Lynann Beaver PharmD, BCPS [facility] main pharmacy [phone] 01/24/2023 7:00 AM

## 2023-01-24 NOTE — Progress Notes (Addendum)
PCCM INTERVAL PROGRESS NOTE   Patient failed SBT pretty quickly today due to tachypnea. After only about 25 minutes she became tachypneic with rates into the 30s, which did not resolve win increased pressure support and larger volumes. There has been some concern anxiety is a diving force behind her failures, however, this seemed pretty well controlled during our SBT on Precedex 1 and Fentanyl infusion. She was awake, comfortable, and in no distress during the trial. Hopefully with ongoing palliative radiotherapy and diuresis this can improve. Discussed with her Daughter.    Joneen Roach, AGACNP-BC [facility]  See Amion for personal pager PCCM on call pager [phone] until 7pm. Please call Elink 7p-7a. [phone]  01/24/2023 2:09 PM

## 2023-01-25 ENCOUNTER — Other Ambulatory Visit: Payer: Self-pay

## 2023-01-25 ENCOUNTER — Ambulatory Visit
Admission: RE | Admit: 2023-01-25 | Discharge: 2023-01-25 | Disposition: A | Discharge status: Home / Self Care | Payer: 59 | Source: Ambulatory Visit | Attending: Radiation Oncology | Admitting: Radiation Oncology

## 2023-01-25 ENCOUNTER — Encounter: Payer: 59 | Admitting: Gastroenterology

## 2023-01-25 ENCOUNTER — Ambulatory Visit: Payer: 59

## 2023-01-25 ENCOUNTER — Inpatient Hospital Stay (HOSPITAL_COMMUNITY): Payer: 59

## 2023-01-25 DIAGNOSIS — J9601 Acute respiratory failure with hypoxia: Secondary | ICD-10-CM | POA: Diagnosis not present

## 2023-01-25 DIAGNOSIS — J988 Other specified respiratory disorders: Secondary | ICD-10-CM | POA: Diagnosis not present

## 2023-01-25 DIAGNOSIS — J189 Pneumonia, unspecified organism: Secondary | ICD-10-CM | POA: Diagnosis not present

## 2023-01-25 DIAGNOSIS — Z87891 Personal history of nicotine dependence: Secondary | ICD-10-CM | POA: Diagnosis not present

## 2023-01-25 DIAGNOSIS — Z510 Encounter for antineoplastic radiation therapy: Secondary | ICD-10-CM | POA: Diagnosis not present

## 2023-01-25 DIAGNOSIS — R918 Other nonspecific abnormal finding of lung field: Secondary | ICD-10-CM | POA: Diagnosis not present

## 2023-01-25 DIAGNOSIS — C3432 Malignant neoplasm of lower lobe, left bronchus or lung: Secondary | ICD-10-CM | POA: Diagnosis not present

## 2023-01-25 LAB — RAD ONC ARIA SESSION SUMMARY
Course Elapsed Days: 7
Plan Fractions Treated to Date: 3
Plan Prescribed Dose Per Fraction: 2 Gy
Plan Total Fractions Prescribed: 27
Plan Total Prescribed Dose: 54 Gy
Reference Point Dosage Given to Date: 6 Gy
Reference Point Session Dosage Given: 2 Gy
Session Number: 6

## 2023-01-25 LAB — BASIC METABOLIC PANEL
Anion gap: 7 (ref 5–15)
BUN: 34 mg/dL — ABNORMAL HIGH (ref 8–23)
CO2: 33 mmol/L — ABNORMAL HIGH (ref 22–32)
Calcium: 8.7 mg/dL — ABNORMAL LOW (ref 8.9–10.3)
Chloride: 97 mmol/L — ABNORMAL LOW (ref 98–111)
Creatinine, Ser: 0.95 mg/dL (ref 0.44–1.00)
GFR, Estimated: >60 mL/min (ref >60)
Glucose, Bld: 134 mg/dL — ABNORMAL HIGH (ref 70–99)
Potassium: 4.7 mmol/L (ref 3.5–5.1)
Sodium: 137 mmol/L (ref 135–145)

## 2023-01-25 LAB — GLUCOSE, CAPILLARY
Glucose-Capillary: 100 mg/dL — ABNORMAL HIGH (ref 70–99)
Glucose-Capillary: 114 mg/dL — ABNORMAL HIGH (ref 70–99)
Glucose-Capillary: 125 mg/dL — ABNORMAL HIGH (ref 70–99)
Glucose-Capillary: 141 mg/dL — ABNORMAL HIGH (ref 70–99)
Glucose-Capillary: 93 mg/dL (ref 70–99)
Glucose-Capillary: 97 mg/dL (ref 70–99)

## 2023-01-25 LAB — CBC
HCT: 28.6 % — ABNORMAL LOW (ref 36.0–46.0)
Hemoglobin: 8.7 g/dL — ABNORMAL LOW (ref 12.0–15.0)
MCH: 27.2 pg (ref 26.0–34.0)
MCHC: 30.4 g/dL (ref 30.0–36.0)
MCV: 89.4 fL (ref 80.0–100.0)
Platelets: 336 10*3/uL (ref 150–400)
RBC: 3.2 MIL/uL — ABNORMAL LOW (ref 3.87–5.11)
RDW: 14.8 % (ref 11.5–15.5)
WBC: 10 10*3/uL (ref 4.0–10.5)
nRBC: 0 % (ref 0.0–0.2)

## 2023-01-25 LAB — MAGNESIUM: Magnesium: 2.3 mg/dL (ref 1.7–2.4)

## 2023-01-25 LAB — HEPARIN LEVEL (UNFRACTIONATED): Heparin Unfractionated: 0.43 IU/mL (ref 0.30–0.70)

## 2023-01-25 LAB — PHOSPHORUS: Phosphorus: 5.7 mg/dL — ABNORMAL HIGH (ref 2.5–4.6)

## 2023-01-25 MED ORDER — LIDOCAINE HCL 2 % IJ SOLN
INTRAMUSCULAR | Status: AC
  Filled 2023-01-25: qty 20

## 2023-01-25 MED ORDER — SUCCINYLCHOLINE CHLORIDE 200 MG/10ML IV SOSY
PREFILLED_SYRINGE | INTRAVENOUS | Status: AC
  Filled 2023-01-25: qty 10

## 2023-01-25 MED ORDER — FENTANYL CITRATE PF 50 MCG/ML IJ SOSY: 50.0000 ug | PREFILLED_SYRINGE | Freq: Once | INTRAMUSCULAR | Status: AC

## 2023-01-25 MED ORDER — FENTANYL CITRATE (PF) 100 MCG/2ML IJ SOLN
INTRAMUSCULAR | Status: AC
  Filled 2023-01-25: qty 2

## 2023-01-25 MED ORDER — ROCURONIUM BROMIDE 50 MG/5ML IV SOLN
100.0000 mg | Freq: Once | INTRAVENOUS | Status: AC
  Filled 2023-01-25: qty 10

## 2023-01-25 MED ORDER — MIDAZOLAM HCL 2 MG/2ML IJ SOLN
INTRAMUSCULAR | Status: AC
  Administered 2023-01-25: 2 mg via INTRAVENOUS
  Filled 2023-01-25: qty 2

## 2023-01-25 MED ORDER — PHENOL 1.4 % MT LIQD
1.0000 | OROMUCOSAL | Status: DC | PRN
  Administered 2023-01-25: 1 via OROMUCOSAL
  Filled 2023-01-25: qty 177

## 2023-01-25 MED ORDER — NYSTATIN 100000 UNIT/GM EX POWD
CUTANEOUS | Status: DC | PRN
  Administered 2023-02-01 – 2023-02-07 (×2): 1 via TOPICAL

## 2023-01-25 MED ORDER — NYSTATIN 100000 UNIT/GM EX POWD
Freq: Two times a day (BID) | CUTANEOUS | Status: DC
  Filled 2023-01-25: qty 15

## 2023-01-25 MED ORDER — FENTANYL CITRATE PF 50 MCG/ML IJ SOSY
PREFILLED_SYRINGE | INTRAMUSCULAR | Status: AC
  Administered 2023-01-25: 50 ug via INTRAVENOUS
  Filled 2023-01-25: qty 2

## 2023-01-25 MED ORDER — BETHANECHOL CHLORIDE 10 MG PO TABS
10.0000 mg | ORAL_TABLET | Freq: Three times a day (TID) | ORAL | Status: DC
  Administered 2023-01-25 – 2023-03-01 (×105): 10 mg
  Filled 2023-01-25 (×107): qty 1

## 2023-01-25 MED ORDER — MIDAZOLAM HCL 2 MG/2ML IJ SOLN: 2.0000 mg | Freq: Once | INTRAMUSCULAR | Status: AC

## 2023-01-25 MED ORDER — DEXAMETHASONE SODIUM PHOSPHATE 4 MG/ML IJ SOLN
INTRAMUSCULAR | Status: AC
  Administered 2023-01-25: 4 mg via INTRAVENOUS
  Filled 2023-01-25: qty 1

## 2023-01-25 MED ORDER — LIDOCAINE HCL (PF) 2% IJ FOR NEBU
5.0000 mL | Freq: Once | RESPIRATORY_TRACT | Status: DC
  Filled 2023-01-25: qty 5

## 2023-01-25 MED ORDER — OXYCODONE HCL 5 MG PO TABS
10.0000 mg | ORAL_TABLET | Freq: Four times a day (QID) | ORAL | Status: DC
  Administered 2023-01-25 – 2023-01-29 (×15): 10 mg
  Filled 2023-01-25 (×15): qty 2

## 2023-01-25 MED ORDER — LIDOCAINE HCL (CARDIAC) PF 100 MG/5ML IV SOSY
PREFILLED_SYRINGE | INTRAVENOUS | Status: AC
  Filled 2023-01-25: qty 5

## 2023-01-25 MED ORDER — ETOMIDATE 2 MG/ML IV SOLN: 20.0000 mg | Freq: Once | INTRAVENOUS | Status: AC

## 2023-01-25 MED ORDER — ROCURONIUM BROMIDE 10 MG/ML (PF) SYRINGE
PREFILLED_SYRINGE | INTRAVENOUS | Status: AC
  Administered 2023-01-25: 100 mg via INTRAVENOUS
  Filled 2023-01-25: qty 10

## 2023-01-25 MED ORDER — LIDOCAINE HCL (PF) 1 % IJ SOLN
INTRAMUSCULAR | Status: AC
  Filled 2023-01-25: qty 5

## 2023-01-25 MED ORDER — DEXAMETHASONE SODIUM PHOSPHATE 4 MG/ML IJ SOLN: 4.0000 mg | Freq: Once | INTRAMUSCULAR | Status: AC

## 2023-01-25 MED ORDER — LIDOCAINE HCL 1 % IJ SOLN
INTRAMUSCULAR | Status: AC
  Filled 2023-01-25: qty 20

## 2023-01-25 MED ORDER — ETOMIDATE 2 MG/ML IV SOLN
INTRAVENOUS | Status: AC
  Administered 2023-01-25: 20 mg via INTRAVENOUS
  Filled 2023-01-25: qty 20

## 2023-01-25 MED ORDER — CLONAZEPAM 1 MG PO TABS
1.0000 mg | ORAL_TABLET | Freq: Once | ORAL | Status: AC
  Administered 2023-01-25: 1 mg
  Filled 2023-01-25: qty 1

## 2023-01-25 NOTE — Progress Notes (Signed)
NAME:  [name], MRN:  [id number], DOB:  October 24, 1955, LOS: 13 ADMISSION DATE:  01/11/2023, CONSULTATION DATE:  01/16/2023 REFERRING MD: FMTS, CHIEF COMPLAINT: Acute Respiratory Failure in the setting of Lung Cancer, COPD and infiltrate on CXR   History of Present Illness:  67 year old woman who presented to [facility] 5/1 for nausea/vomiting/diarrhea and abdominal pain x 2 weeks. PMHx significant for COPD, lung CA (adenocarcinoma of LLL, biopsy planned for 5/7), bladder CA (s/p TURBT), GERD, and panic/anxiety. Admitted for AHRF in setting of CAP/COPD exacerbation to FMTS at [facility].   Found to have postobstructive PNA (RML/RLL) with progressive respiratory failure requiring intubation 5/6. Patient also experienced panic attacks/anxiety leading to exacerbated WOB and VCD with associated stridor. Of note, patient is a DNR; however she endorses that she is not ready to die and wanted to be intubated. (See Prior Note documentation 01/16/2023). Transferred to [facility] 5/7 for ongoing oncological care.  Pertinent Medical History:   Past Medical History:  Diagnosis Date   Abnormal CT lung screening 07/10/2020   Anxiety    Anxiety state 02/28/2012   GAD7 : 6  PHQ9 : 6   Arthritis    Bladder cancer (HCC) 09/2020   urologist--- dr Berneice Heinrich, incidental finding on PET scan 12/ 2021,  s/p TURBT 09-18-2020 high grade papillary urothelial    Cancer related pain 03/11/2022   CAP (community acquired pneumonia) 04/20/2022   Centrilobular emphysema (HCC)    Chronic left shoulder pain 04/29/2021   Close exposure to COVID-19 virus 05/03/2019   Constipation 04/09/2019   Costochondritis, acute 08/14/2020   DDD (degenerative disc disease), lumbar    Depression    DOE (dyspnea on exertion)    10-29-2020  per pt can do house work without sob, when walks/ stairs stops frequently  (stated recovers quickly when stops)   Emphysema lung (HCC)    Epidermoid cyst 10/29/2018   Recurrent. Located on left hip at iliac crest 2" anterior  to midaxillary line. Removed 2013, 2020, 2022.    Epigastric pain 11/16/2020   Fibroid 2003   GERD (gastroesophageal reflux disease)    History of radiation therapy    Left lung- 10/06/20-10/16/20- Dr. Antony Blackbird   Leg cramping 11/13/2019   Lumbar spine pain 01/09/2015   Menopausal vaginal dryness 11/13/2019   Multiple closed fractures of ribs of left side 08/20/2022   Nocturnal enuresis 06/23/2020   Chronic overnight "leaking" of unknown source. Likely urine, but reports no color or odor. Volume enough for liners or light pads. Occurs every night.    Primary adenocarcinoma of lower lobe of left lung (HCC) 07/2020   oncologist--- dr Arbutus Ped---  dx 11/ 2021 bilateral lower lobe masses,  s/p bronchoscopy w/ bx's 07-28-2020 malignancy cells on left ;  completed SBRT bilateral lower lung 10-16-2020 5 fractions   Radiation-induced dermatitis    on 10-29-2020 pt complaint stomach and chest areas,  completed SBRT 10-16-2020   Seasonal allergies    Stage 3 severe COPD by GOLD classification [facility])    pulmonologist--- dr Tonia Brooms,  using anoro inhaler daily   Wears glasses    Significant Hospital Events: Including procedures, antibiotic start and stop dates in addition to other pertinent events   01/16/2023 Transferred to ICU for acute Respiratory Failure , intubated  5/7 bronch by Icard. significant extrinsic compression of the airways in the right lower lobe and middle lobe. This is likely malignant obstruction. Bx sent. Transferred to [facility] for rad/onc eval. PICC placed 5/8 stable still high peep/fio2 needs. Rad/onc  consult requested  5/10 tolerating SBT trial this a.m. on low-dose fentanyl and Precedex.  Propofol DC'd due to high triglycerides 5/11 tolerates SBT's, pressure support 10/5 5/12 Off of pressors. Remains on vent, sedated with Precedex/Fentanyl. PMT consulted. 5/13 Weaning on PSV 14/8. > 10L positive over the course of admission. Diuresing. 5/14 Weaning on PSV. Ongoing diuresis. 5/15  Tolerating SBT, PSV 5/5, following commands. Plan for extubation. Continue diuresis, Foley placed for persistent urinary retention. Cortrak/DHT placement post-extubation.  Interim History / Subjective:  No significant events overnight Overall looks good on SBT PSV 5/5, tolerating well - TV 400-500s, wide awake, following commands readily with good strength Due for SBRT today 1530 +Urinary retention requiring multiple I&O attempts, place Foley Plan for extubation today  Objective:  Blood pressure (!) 154/66, pulse 89, temperature 98.4 F (36.9 C), temperature source Axillary, resp. rate (!) 27, height 5\' 8"  (1.727 m), weight 81.5 kg, SpO2 96 %.    Vent Mode: PSV;CPAP FiO2 (%):  [40 %] 40 % Set Rate:  [14 bmp] 14 bmp Vt Set:  [510 mL] 510 mL PEEP:  [5 cmH20] 5 cmH20 Pressure Support:  [5 cmH20-12 cmH20] 5 cmH20 Plateau Pressure:  [16 cmH20] 16 cmH20   Intake/Output Summary (Last 24 hours) at 01/25/2023 0915 Last data filed at 01/25/2023 0710 Gross per 24 hour  Intake 2684.21 ml  Output 4475 ml  Net -1790.79 ml    Filed Weights   01/23/23 0500 01/24/23 0702 01/25/23 0419  Weight: 88.4 kg 82.1 kg 81.5 kg   Physical Examination: General: Acutely ill-appearing middle-aged woman in NAD. HEENT: [place]/AT, anicteric sclera, PERRL, moist mucous membranes. Neuro: Awake, unable to assess orientation due to ETT. Nodding appropriately to questions, communicating with gestures. Responds to verbal stimuli. Following commands consistently. Moves all 4 extremities spontaneously. CV: RRR, no m/g/r. PULM: Breathing even and unlabored on vent (PSV 5/5). Lung fields CTAB. GI: Soft, nontender, nondistended. Normoactive bowel sounds. Extremities: No LE edema noted. Skin: Warm/dry, no rashes.  Resolved Hospital Problem List:  Hypertriglyceridemia due to propofol Hyponatremia Hypotension, Drug induced from sedation   Assessment & Plan:   Acute respiratory failure in setting of COPD, recurrent lung  cancer w/ post obstructive atelectasis +/- post obstructive PNA. Intubated 5/7,  Zosyn completed 5/13 Pulmonary edema Small bilateral pleural effusions. stable COPD -Tolerating SBT/PSV 5/5 today on rounds, awake/following commands - Plan for extubation this AM (5/15) - Monitor for post-extubation stridor, may require steroids/racemic epi - Wean O2 for sat > 90% - VAP bundle - Bronchodilators (Brovana, Yupelri, budesonide) - Continued diuresis as tolerated, given net +10L/admission - Pulmonary hygiene - PAD protocol for sedation: Precedex and Fentanyl for goal RASS 0 to -1 - Once extubated, plan for Cortrak/DHT placement and scheduled Klonopin/Oxycodone - SBRT per Rad Onc - If failing extubation, plan for reintubation and discussion re: tracheostomy - PMT following, appreciate assistance  Paroxysmal atrial fibrillation  Anticoagulated at baseline with apixaban. - Heparin gtt per pharmacy - BB PRN for rate control - Cardiac monitoring/tele - Optimize electrolytes for K > 4, Mg > 2  At risk for malnutrition  - TF per RD/Nutrition, appreciate assistance - Cortrak/DHT to be placed post-extubation  Hyperglycemia - SSI - CBGs Q4H - Goal CBG 140-180  Anemia - Trend H&H - Monitor for signs of active bleeding - Transfuse for Hgb < 7.0 or hemodynamically significant bleeding  Urinary retention likely 2/2 sedation - Increase bethanechol to 10mg  TID - Replace Foley  Best Practice: (right click and "Reselect all SmartList  Selections" daily)   Diet/type: tubefeeds DVT prophylaxis: heparin gtt GI prophylaxis: H2B Lines: Central line - PICC Foley:  Yes Code Status:  full code Last date of multidisciplinary goals of care discussion 5/11 discussion with daughters, they have unrealistic goal of taking mom home with a tracheostomy and vent if needed.  Desire full CODE STATUS and reintubation.  Critical care time:   The patient is critically ill with multiple organ system failure and  requires high complexity decision making for assessment and support, frequent evaluation and titration of therapies, advanced monitoring, review of radiographic studies and interpretation of complex data.   Critical Care Time devoted to patient care services, exclusive of separately billable procedures, described in this note is 38 minutes.  Tim Lair, PA-C [facility] 01/25/23 9:16 AM  Please see [email or web address] for pager details.  From 7A-7P if no response, please call [phone] After hours, please call ELink [phone]

## 2023-01-25 NOTE — Progress Notes (Signed)
ANTICOAGULATION CONSULT NOTE - Follow Up Consult  Pharmacy Consult for Heparin IV Indication: atrial fibrillation  Allergies  Allergen Reactions   Codeine Nausea And Vomiting and Other (See Comments)    Hallucinations   Prednisone Other (See Comments)    'Made me feel funny inside my head"-pt cannot be specific    Patient Measurements: Height: 5\' 8"  (172.7 cm) Weight: 81.5 kg (179 lb 10.8 oz) IBW/kg (Calculated) : 63.9 Heparin Dosing Weight: 81mg   Vital Signs: Temp: 97.7 F (36.5 C) (05/15 0351) Temp Source: Oral (05/15 0351) BP: 132/52 (05/15 0406) Pulse Rate: 84 (05/15 0406)  Labs: Recent Labs    01/23/23 0525 01/24/23 0515 01/25/23 0400  HGB 8.6* 7.8* 8.7*  HCT 28.7* 26.5* 28.6*  PLT 297 301 336  HEPARINUNFRC 0.56 0.32 0.43  CREATININE 0.91 0.84 0.95     Estimated Creatinine Clearance: 64.3 mL/min (by C-G formula based on SCr of 0.95 mg/dL).   Medications:  Infusions:   sodium chloride 10 mL/hr at 01/25/23 0400   dexmedetomidine (PRECEDEX) IV infusion 1 mcg/kg/hr (01/25/23 0400)   feeding supplement (OSMOLITE 1.5 CAL) 55 mL/hr at 01/25/23 0400   fentaNYL infusion INTRAVENOUS 120 mcg/hr (01/25/23 0400)   heparin 1,200 Units/hr (01/25/23 0400)   norepinephrine (LEVOPHED) Adult infusion Stopped (01/21/23 1458)    Assessment: 71 yoF with hx bladder/lung cancer, COPD who presented to the ED on 01/11/23 with poor oral intake, n/v and SOB. She was subsequently found to have postobstructive PNA and new onset afib with RVR. She was started in Eliquis for afib on 01/12/23, but held after doses on 5/4, transitioned to heparin on 01/16/23 for bronch with biopsies 5/7, heparin resumed post procedure on 01/18/23 while she remains intubated.   Today, 01/25/2023: - Heparin level 0.43, remains therapeutic on heparin 1200 units/hr -CBC:  Hgb low but stable, Plt WNL - RN reports no bleeding, IV problems, or other complications.    Goal of Therapy:  Heparin level 0.3-0.7  units/ml aPTT 66-102 seconds Monitor platelets by anticoagulation protocol: Yes   Plan:  Continue heparin IV infusion at 1200 units/hr Daily heparin level, and CBC Follow up long-term anticoagulation plans   Arley Phenix RPh 01/25/2023, 4:40 AM

## 2023-01-25 NOTE — Progress Notes (Signed)
OT Cancellation Note  Patient Details Name: AFSHEEN LEWALLEN MRN: [id number] DOB: [birth year]   Cancelled Treatment:    Reason Eval/Treat Not Completed: Patient not medically ready Nurse reported that patient is currently in a weaning trial. OT to continue to follow and check back as schedule will allow.  Rosalio Loud, MS Acute Rehabilitation Department Office# [phone]  01/25/2023, 8:00 AM

## 2023-01-25 NOTE — Progress Notes (Signed)
OT Cancellation Note  Patient Details Name: [name] MRN: [id number] DOB: 07/04/1956   Cancelled Treatment:    Reason Eval/Treat Not Completed: Medical issues which prohibited therapy New order added by MD, patient was just re intubated after failed extubation. OT to continue to follow and check back as schedule will allow.  Rosalio Loud, MS Acute Rehabilitation Department Office# [phone]  01/25/2023, 11:42 AM

## 2023-01-25 NOTE — Progress Notes (Addendum)
Brief Nutrition Note  Received consult for assessment due to new order for feeding tube placement. RD following patient, last seen and assessed yesterday. Patient remains intubated but plan for extubation this AM. Per CCM, plan for DHT placement after extubation.   If plan to restart tube feeds at that time, recommend restarting current TF regimen once new DHT placed and xray verified in stomach: Osmolite 1.5 @ 55 ml/hr (1320 ml/day) PROSource TF20 60 ml daily - Provides 2060 kcal, 103 grams of protein, and 1006 ml of H2O.   ADDENDUM (14:50): Patient extubated but had to be quickly re-intubated after decompensating. OGT replaced, xray verified in stomach.  Received secure chat from RN that due to xray showing stomach distension, MD wants to restart tube feeds at trickle, can advance as tolerated. Plan to restart at 36mL/hr and advance as tolerated by 10mL Q4H, orders updated.  Will monitor.   Shelle Iron RD, LDN For contact information, refer to [facility].

## 2023-01-25 NOTE — Progress Notes (Addendum)
PCCM Interval Progress Note  Shortly after extubation, patient quickly decompensated with poor air movement, stridor (despite +cuff leak pre-extubation), tachypnea and tachycardia to 160s. Dexamethasone 4mg  was administered for stridor. Despite this, she quickly became hypoxemic with SpO2 precipitously dropping into the 60s, tripoding in bed despite NRB mask/[place]. I began to bag the patient with BVM with slow improvement in sats to low 90s.  Decision was quickly made to reintubate. (Please see separate procedure note from Dr. Francine Graven). Patient overall tolerated procedure well with SpO2 improved to 100% post-reintubation and normal WOB on vent.  Dr. Francine Graven called and updated daughter, [name], by phone.  Tim Lair, PA-C [facility] 01/25/23 11:44 AM  Please see [email or web address] for pager details.  From 7A-7P if no response, please call [phone] After hours, please call ELink [phone]

## 2023-01-25 NOTE — Procedures (Signed)
Intubation Procedure Note  [name]  [id number]  1955/10/08  Date:01/25/23  Time:11:42 AM   Provider Performing:Avett Reineck B Eymi Lipuma    Procedure: Intubation (31500)  Indication(s) Respiratory Failure  Consent Unable to obtain consent due to emergent nature of procedure.   Anesthesia Etomidate, Versed, Fentanyl, and Rocuronium   Time Out Verified patient identification, verified procedure, site/side was marked, verified correct patient position, special equipment/implants available, medications/allergies/relevant history reviewed, required imaging and test results available.   Sterile Technique Usual hand hygeine, masks, and gloves were used   Procedure Description Patient positioned in bed supine.  Sedation given as noted above.  Patient was intubated with endotracheal tube using Glidescope.  View was Grade 1 full glottis . Vocal cord edema present with narrowing of airway.  Number of attempts was 1.  Colorimetric CO2 detector was consistent with tracheal placement.   Complications/Tolerance None; patient tolerated the procedure well. Chest X-ray is ordered to verify placement.   EBL Minimal   Specimen(s) None

## 2023-01-25 NOTE — Progress Notes (Signed)
PT Cancellation Note  Patient Details Name: MAKAYA LASCALA MRN: [id number] DOB: 1955/09/22   Cancelled Treatment:    Reason Eval/Treat Not Completed: Medical issues which prohibited therapy Patient re-intubated after extubation, will follow. 'Blanchard Kelch PT Acute Rehabilitation Services Office [phone] Weekend [phone]    Rada Hay 01/25/2023, 12:34 PM

## 2023-01-25 NOTE — Progress Notes (Signed)
Pt was extubated and reintubated due to airway swelling. No complications at this time with reintubation.

## 2023-01-25 NOTE — Progress Notes (Addendum)
Patient extubated by RT per order, decompensating quickly after, MD called to bedside.

## 2023-01-26 ENCOUNTER — Inpatient Hospital Stay (HOSPITAL_COMMUNITY): Payer: 59

## 2023-01-26 ENCOUNTER — Ambulatory Visit: Payer: 59

## 2023-01-26 ENCOUNTER — Other Ambulatory Visit: Payer: Self-pay

## 2023-01-26 ENCOUNTER — Other Ambulatory Visit: Payer: Self-pay | Admitting: *Deleted

## 2023-01-26 ENCOUNTER — Ambulatory Visit
Admission: RE | Admit: 2023-01-26 | Discharge: 2023-01-26 | Disposition: A | Discharge status: Home / Self Care | Payer: 59 | Source: Ambulatory Visit | Attending: Radiation Oncology | Admitting: Radiation Oncology

## 2023-01-26 DIAGNOSIS — Z510 Encounter for antineoplastic radiation therapy: Secondary | ICD-10-CM | POA: Diagnosis not present

## 2023-01-26 DIAGNOSIS — Z87891 Personal history of nicotine dependence: Secondary | ICD-10-CM | POA: Diagnosis not present

## 2023-01-26 DIAGNOSIS — J9601 Acute respiratory failure with hypoxia: Secondary | ICD-10-CM | POA: Diagnosis not present

## 2023-01-26 DIAGNOSIS — R918 Other nonspecific abnormal finding of lung field: Secondary | ICD-10-CM | POA: Diagnosis not present

## 2023-01-26 DIAGNOSIS — C3432 Malignant neoplasm of lower lobe, left bronchus or lung: Secondary | ICD-10-CM | POA: Diagnosis not present

## 2023-01-26 DIAGNOSIS — J189 Pneumonia, unspecified organism: Secondary | ICD-10-CM | POA: Diagnosis not present

## 2023-01-26 LAB — BASIC METABOLIC PANEL
Anion gap: 6 (ref 5–15)
BUN: 34 mg/dL — ABNORMAL HIGH (ref 8–23)
CO2: 29 mmol/L (ref 22–32)
Calcium: 8.8 mg/dL — ABNORMAL LOW (ref 8.9–10.3)
Chloride: 100 mmol/L (ref 98–111)
Creatinine, Ser: 0.96 mg/dL (ref 0.44–1.00)
GFR, Estimated: >60 mL/min (ref >60)
Glucose, Bld: 122 mg/dL — ABNORMAL HIGH (ref 70–99)
Potassium: 4.8 mmol/L (ref 3.5–5.1)
Sodium: 135 mmol/L (ref 135–145)

## 2023-01-26 LAB — RAD ONC ARIA SESSION SUMMARY
Course Elapsed Days: 8
Plan Fractions Treated to Date: 4
Plan Prescribed Dose Per Fraction: 2 Gy
Plan Total Fractions Prescribed: 27
Plan Total Prescribed Dose: 54 Gy
Reference Point Dosage Given to Date: 8 Gy
Reference Point Session Dosage Given: 2 Gy
Session Number: 7

## 2023-01-26 LAB — GLUCOSE, CAPILLARY
Glucose-Capillary: 109 mg/dL — ABNORMAL HIGH (ref 70–99)
Glucose-Capillary: 130 mg/dL — ABNORMAL HIGH (ref 70–99)
Glucose-Capillary: 109 mg/dL — ABNORMAL HIGH (ref 70–99)
Glucose-Capillary: 155 mg/dL — ABNORMAL HIGH (ref 70–99)
Glucose-Capillary: 158 mg/dL — ABNORMAL HIGH (ref 70–99)
Glucose-Capillary: 159 mg/dL — ABNORMAL HIGH (ref 70–99)

## 2023-01-26 LAB — PHOSPHORUS: Phosphorus: 4.2 mg/dL (ref 2.5–4.6)

## 2023-01-26 LAB — CBC
HCT: 27.5 % — ABNORMAL LOW (ref 36.0–46.0)
Hemoglobin: 8.3 g/dL — ABNORMAL LOW (ref 12.0–15.0)
MCH: 26.6 pg (ref 26.0–34.0)
MCHC: 30.2 g/dL (ref 30.0–36.0)
MCV: 88.1 fL (ref 80.0–100.0)
Platelets: 359 10*3/uL (ref 150–400)
RBC: 3.12 MIL/uL — ABNORMAL LOW (ref 3.87–5.11)
RDW: 14.6 % (ref 11.5–15.5)
WBC: 14.1 10*3/uL — ABNORMAL HIGH (ref 4.0–10.5)
nRBC: 0 % (ref 0.0–0.2)

## 2023-01-26 LAB — MAGNESIUM: Magnesium: 2.2 mg/dL (ref 1.7–2.4)

## 2023-01-26 LAB — HEPARIN LEVEL (UNFRACTIONATED)
Heparin Unfractionated: 0.28 IU/mL — ABNORMAL LOW (ref 0.30–0.70)
Heparin Unfractionated: 0.39 IU/mL (ref 0.30–0.70)
Heparin Unfractionated: 0.48 IU/mL (ref 0.30–0.70)

## 2023-01-26 MED ORDER — ROCURONIUM BROMIDE 50 MG/5ML IV SOLN: 100.0000 mg | Freq: Once | INTRAVENOUS | Status: DC

## 2023-01-26 MED ORDER — FENTANYL CITRATE (PF) 100 MCG/2ML IJ SOLN
200.0000 ug | Freq: Once | INTRAMUSCULAR | Status: AC
  Administered 2023-01-27: 100 ug via INTRAVENOUS
  Filled 2023-01-26: qty 4

## 2023-01-26 MED ORDER — MIDAZOLAM HCL 2 MG/2ML IJ SOLN
INTRAMUSCULAR | Status: AC
  Administered 2023-01-26: 2 mg
  Filled 2023-01-26: qty 2

## 2023-01-26 MED ORDER — FENTANYL CITRATE (PF) 100 MCG/2ML IJ SOLN
INTRAMUSCULAR | Status: AC
  Filled 2023-01-26: qty 2

## 2023-01-26 MED ORDER — CLONAZEPAM 1 MG PO TBDP
1.0000 mg | ORAL_TABLET | Freq: Three times a day (TID) | ORAL | Status: DC
  Administered 2023-01-26 – 2023-01-29 (×11): 1 mg
  Filled 2023-01-26: qty 1
  Filled 2023-01-26: qty 2
  Filled 2023-01-26 (×7): qty 1
  Filled 2023-01-26: qty 2
  Filled 2023-01-26: qty 1
  Filled 2023-01-26 (×2): qty 2
  Filled 2023-01-26: qty 1

## 2023-01-26 MED ORDER — ROCURONIUM BROMIDE 10 MG/ML (PF) SYRINGE
PREFILLED_SYRINGE | INTRAVENOUS | Status: AC
  Administered 2023-01-26: 100 mg
  Filled 2023-01-26: qty 10

## 2023-01-26 MED ORDER — SODIUM CHLORIDE 0.9 % IV SOLN
6.2500 mg | Freq: Once | INTRAVENOUS | Status: AC
  Administered 2023-01-26: 6.25 mg via INTRAVENOUS
  Filled 2023-01-26 (×4): qty 0.25

## 2023-01-26 MED ORDER — FENTANYL CITRATE (PF) 100 MCG/2ML IJ SOLN: 200.0000 ug | Freq: Once | INTRAMUSCULAR | Status: DC

## 2023-01-26 MED ORDER — FENTANYL CITRATE PF 50 MCG/ML IJ SOSY
PREFILLED_SYRINGE | INTRAMUSCULAR | Status: AC
  Filled 2023-01-26: qty 2

## 2023-01-26 MED ORDER — MIDAZOLAM HCL 2 MG/2ML IJ SOLN: 5.0000 mg | Freq: Once | INTRAMUSCULAR | Status: DC

## 2023-01-26 MED ORDER — SUCCINYLCHOLINE CHLORIDE 200 MG/10ML IV SOSY
PREFILLED_SYRINGE | INTRAVENOUS | Status: AC
  Filled 2023-01-26: qty 10

## 2023-01-26 MED ORDER — SODIUM CHLORIDE 3 % IN NEBU
4.0000 mL | INHALATION_SOLUTION | Freq: Two times a day (BID) | RESPIRATORY_TRACT | Status: AC
  Administered 2023-01-26 – 2023-01-29 (×6): 4 mL via RESPIRATORY_TRACT
  Filled 2023-01-26 (×6): qty 4

## 2023-01-26 MED ORDER — MIDAZOLAM HCL 2 MG/2ML IJ SOLN
5.0000 mg | Freq: Once | INTRAMUSCULAR | Status: AC
  Administered 2023-01-27: 4 mg via INTRAVENOUS
  Filled 2023-01-26: qty 6

## 2023-01-26 MED ORDER — ETOMIDATE 2 MG/ML IV SOLN
INTRAVENOUS | Status: AC
  Administered 2023-01-26: 20 mg
  Filled 2023-01-26: qty 20

## 2023-01-26 MED ORDER — ETOMIDATE 2 MG/ML IV SOLN: 20.0000 mg | Freq: Once | INTRAVENOUS | Status: DC

## 2023-01-26 MED ORDER — ETOMIDATE 2 MG/ML IV SOLN
20.0000 mg | Freq: Once | INTRAVENOUS | Status: AC
  Administered 2023-01-27: 20 mg via INTRAVENOUS
  Filled 2023-01-26: qty 10

## 2023-01-26 MED ORDER — DEXAMETHASONE SODIUM PHOSPHATE 4 MG/ML IJ SOLN
4.0000 mg | Freq: Four times a day (QID) | INTRAMUSCULAR | Status: AC
  Administered 2023-01-26 – 2023-01-27 (×4): 4 mg via INTRAVENOUS
  Filled 2023-01-26 (×4): qty 1

## 2023-01-26 MED ORDER — FUROSEMIDE 10 MG/ML IJ SOLN
40.0000 mg | Freq: Four times a day (QID) | INTRAMUSCULAR | Status: AC
  Administered 2023-01-26 (×2): 40 mg via INTRAVENOUS
  Filled 2023-01-26 (×2): qty 4

## 2023-01-26 MED ORDER — ROCURONIUM BROMIDE 50 MG/5ML IV SOLN
100.0000 mg | Freq: Once | INTRAVENOUS | Status: AC
  Administered 2023-01-27: 100 mg via INTRAVENOUS

## 2023-01-26 MED ORDER — DEXAMETHASONE SODIUM PHOSPHATE 4 MG/ML IJ SOLN: 4.0000 mg | Freq: Four times a day (QID) | INTRAMUSCULAR | Status: DC

## 2023-01-26 NOTE — Progress Notes (Signed)
RT placed pt back on full support due to increase HR.

## 2023-01-26 NOTE — Progress Notes (Signed)
NAME:  [name], MRN:  [id number], DOB:  1956-03-30, LOS: 14 ADMISSION DATE:  01/11/2023, CONSULTATION DATE:  01/16/2023 REFERRING MD: FMTS, CHIEF COMPLAINT: Acute Respiratory Failure in the setting of Lung Cancer, COPD and infiltrate on CXR   History of Present Illness:  67 year old woman who presented to [facility] 5/1 for nausea/vomiting/diarrhea and abdominal pain x 2 weeks. PMHx significant for COPD, lung CA (adenocarcinoma of LLL, biopsy planned for 5/7), bladder CA (s/p TURBT), GERD, and panic/anxiety. Admitted for AHRF in setting of CAP/COPD exacerbation to FMTS at [facility].   Found to have postobstructive PNA (RML/RLL) with progressive respiratory failure requiring intubation 5/6. Patient also experienced panic attacks/anxiety leading to exacerbated WOB and VCD with associated stridor. Of note, patient is a DNR; however she endorses that she is not ready to die and wanted to be intubated. (See Prior Note documentation 01/16/2023). Transfer[facility] 5/7 for ongoing oncological care.  Pertinent Medical History:   Past Medical History:  Diagnosis Date   Abnormal CT lung screening 07/10/2020   Anxiety    Anxiety state 02/28/2012   GAD7 : 6  PHQ9 : 6   Arthritis    Bladder cancer (HCC) 09/2020   urologist--- dr Berneice Heinrich, incidental finding on PET scan 12/ 2021,  s/p TURBT 09-18-2020 high grade papillary urothelial    Cancer related pain 03/11/2022   CAP (community acquired pneumonia) 04/20/2022   Centrilobular emphysema (HCC)    Chronic left shoulder pain 04/29/2021   Close exposure to COVID-19 virus 05/03/2019   Constipation 04/09/2019   Costochondritis, acute 08/14/2020   DDD (degenerative disc disease), lumbar    Depression    DOE (dyspnea on exertion)    10-29-2020  per pt can do house work without sob, when walks/ stairs stops frequently  (stated recovers quickly when stops)   Emphysema lung (HCC)    Epidermoid cyst 10/29/2018   Recurrent. Located on left hip at iliac crest 2" anterior  to midaxillary line. Removed 2013, 2020, 2022.    Epigastric pain 11/16/2020   Fibroid 2003   GERD (gastroesophageal reflux disease)    History of radiation therapy    Left lung- 10/06/20-10/16/20- Dr. Antony Blackbird   Leg cramping 11/13/2019   Lumbar spine pain 01/09/2015   Menopausal vaginal dryness 11/13/2019   Multiple closed fractures of ribs of left side 08/20/2022   Nocturnal enuresis 06/23/2020   Chronic overnight "leaking" of unknown source. Likely urine, but reports no color or odor. Volume enough for liners or light pads. Occurs every night.    Primary adenocarcinoma of lower lobe of left lung (HCC) 07/2020   oncologist--- dr Arbutus Ped---  dx 11/ 2021 bilateral lower lobe masses,  s/p bronchoscopy w/ bx's 07-28-2020 malignancy cells on left ;  completed SBRT bilateral lower lung 10-16-2020 5 fractions   Radiation-induced dermatitis    on 10-29-2020 pt complaint stomach and chest areas,  completed SBRT 10-16-2020   Seasonal allergies    Stage 3 severe COPD by GOLD classification [facility])    pulmonologist--- dr Tonia Brooms,  using anoro inhaler daily   Wears glasses    Significant Hospital Events: Including procedures, antibiotic start and stop dates in addition to other pertinent events   01/16/2023 Transferred to ICU for acute Respiratory Failure , intubated  5/7 bronch by Icard. significant extrinsic compression of the airways in the right lower lobe and middle lobe. This is likely malignant obstruction. Bx sent. Transferred to [facility] for rad/onc eval. PICC placed 5/8 stable still high peep/fio2 needs. Rad/onc  consult requested  5/10 tolerating SBT trial this a.m. on low-dose fentanyl and Precedex.  Propofol DC'd due to high triglycerides 5/11 tolerates SBT's, pressure support 10/5 5/12 Off of pressors. Remains on vent, sedated with Precedex/Fentanyl. PMT consulted. 5/13 Weaning on PSV 14/8. > 10L positive over the course of admission. Diuresing. 5/14 Weaning on PSV. Ongoing diuresis. 5/15  Tolerating SBT, PSV 5/5, following commands. Continue diuresis, Foley placed for persistent urinary retention. Extubated, decompensated quickly after requiring reintubation. DHT placed. 5/16 Agitated overnight, c/o throat pain with ETT, RASS 1-2 despite sedation. Failed extubation 5/15. Tolerating SBT PSV 10/5, FiO2 35%.  Interim History / Subjective:  Failed extubation yesterday, reintubated  Agitated/restless overnight per nursing Sedation (Fentanyl, Precedex) increased without improvement More calm this morning, tolerating PSV 10/5, FiO2 35% Plan for diuresis, continued steroids Ultimately may require tracheostomy early next week  Objective:  Blood pressure 132/61, pulse 96, temperature 99.4 F (37.4 C), temperature source Axillary, resp. rate 18, height 5\' 8"  (1.727 m), weight 83.4 kg, SpO2 90 %.    Vent Mode: PRVC FiO2 (%):  [30 %-100 %] 35 % Set Rate:  [14 bmp] 14 bmp Vt Set:  [510 mL] 510 mL PEEP:  [5 cmH20] 5 cmH20 Pressure Support:  [5 cmH20] 5 cmH20 Plateau Pressure:  [16 cmH20-21 cmH20] 16 cmH20   Intake/Output Summary (Last 24 hours) at 01/26/2023 0750 Last data filed at 01/26/2023 1610 Gross per 24 hour  Intake 1825.21 ml  Output 1695 ml  Net 130.21 ml    Filed Weights   01/24/23 0702 01/25/23 0419 01/26/23 0401  Weight: 82.1 kg 81.5 kg 83.4 kg   Physical Examination: General: Acute-on-chronically ill-appearing middle-aged woman in NAD. HEENT: [place]/AT, anicteric sclera, PERRL, moist mucous membranes. ETT in place. Neuro: Awake, oriented x 4. Nodding appropriately/mouthing answers to questions.Responds to verbal stimuli. Following commands consistently. Moves all 4 extremities spontaneously.  CV: RRR, no m/g/r. PULM: Breathing even and unlabored on vent (PSV 10/5, FiO2 35%). Lung fields clear, slightly more diminished on L. GI: Soft, nontender, nondistended. Normoactive bowel sounds. Extremities: Trace bilateral symmetric LE edema noted. Skin: Warm/dry, no  rashes.  Resolved Hospital Problem List:  Hypertriglyceridemia due to propofol Hyponatremia Hypotension, Drug induced from sedation   Assessment & Plan:   Acute respiratory failure in setting of COPD, recurrent lung cancer w/ post obstructive atelectasis +/- post obstructive PNA. Intubated 5/7,  Zosyn completed 5/13 Pulmonary edema Small bilateral pleural effusions. stable COPD - Failed extubation 5/15; rapidly decompensated after ETT removed with tachypnea to 40s/poor air movement, stridor requiring Decadron, tachycardia to 160s, reintubated - Vocal cord edema noted on reintubation despite +cuff leak prior to extubation - Continue vent support (4-8cc/kg IBW) - Daily WUA/SBT as tolerated, tolerating vent wean (PSV 10/5) today, 5/16 - Wean FiO2 for O2 sat > 90% - Continue steroids, Decadron 4mg  Q6H - Bronchodilators (Brovana, Yupelri, budesonide) - Diuresis  (Lasix) as tolerated, +11L/admission - VAP bundle - Pulmonary hygiene - PAD protocol for sedation: Precedex, Fentanyl, and Versed for goal RASS 0 to -1 - Ongoing SBRT per Rad Onc - If not beneficial for patient to reattempt extubation; next course of action will be tracheostomy placement - PMT following, appreciate assistance  Severe anxiety History of panic attacks - PAD protocol as above - Scheduled oxycodone, scheduled Klonopin - Continue home Celexa  Paroxysmal atrial fibrillation  Anticoagulated at baseline with apixaban. - Heparin gtt per pharmacy - BB PRN for rate control - Cardiac monitoring - Optimize electrolytes for K > 4, Mg >  2  At risk for malnutrition  - TF per RD/Nutrition, appreciate assistance - DHT replaced post-extubation attempt, continue  Hyperglycemia - SSI - CBGs Q4H - Goal CBG 140-180  Anemia of chronic disease - Trend H&H - Monitor for signs of active bleeding - Transfuse for Hgb < 7.0 or hemodynamically significant bleeding  Urinary retention likely 2/2 sedation - Continue  bethanechol 10mg  TID - Keep Foley for now  Best Practice: (right click and "Reselect all SmartList Selections" daily)   Diet/type: tubefeeds DVT prophylaxis: heparin gtt GI prophylaxis: H2B Lines: Central line - PICC Foley:  Yes Code Status:  full code Last date of multidisciplinary goals of care discussion 5/11 discussion with daughters, they have unrealistic goal of taking mom home with a tracheostomy and vent if needed.  Desire full CODE STATUS and reintubation.  Critical care time:   The patient is critically ill with multiple organ system failure and requires high complexity decision making for assessment and support, frequent evaluation and titration of therapies, advanced monitoring, review of radiographic studies and interpretation of complex data.   Critical Care Time devoted to patient care services, exclusive of separately billable procedures, described in this note is 36 minutes.  Tim Lair, PA-C [facility] 01/26/23 7:50 AM  Please see [email or web address] for pager details.  From 7A-7P if no response, please call [phone] After hours, please call ELink [phone]

## 2023-01-26 NOTE — Progress Notes (Signed)
ANTICOAGULATION CONSULT NOTE - Follow Up Consult  Pharmacy Consult for Heparin IV Indication: atrial fibrillation  Allergies  Allergen Reactions   Codeine Nausea And Vomiting and Other (See Comments)    Hallucinations   Prednisone Other (See Comments)    'Made me feel funny inside my head"-pt cannot be specific    Patient Measurements: Height: 5\' 8"  (172.7 cm) Weight: 83.4 kg (183 lb 13.8 oz) IBW/kg (Calculated) : 63.9 Heparin Dosing Weight: 81mg   Vital Signs: Temp: 99.4 F (37.4 C) (05/16 0400) Temp Source: Axillary (05/16 0400) BP: 112/44 (05/16 0800) Pulse Rate: 77 (05/16 0800)  Labs: Recent Labs    01/24/23 0515 01/25/23 0400 01/26/23 0415  HGB 7.8* 8.7* 8.3*  HCT 26.5* 28.6* 27.5*  PLT 301 336 359  HEPARINUNFRC 0.32 0.43 0.28*  CREATININE 0.84 0.95 0.96     Estimated Creatinine Clearance: 64.4 mL/min (by C-G formula based on SCr of 0.96 mg/dL).   Medications:  Infusions:   sodium chloride 10 mL/hr at 01/26/23 0637   dexmedetomidine (PRECEDEX) IV infusion 0.9 mcg/kg/hr (01/26/23 4098)   feeding supplement (OSMOLITE 1.5 CAL) 40 mL/hr at 01/26/23 0637   fentaNYL infusion INTRAVENOUS 175 mcg/hr (01/26/23 0637)   heparin 1,350 Units/hr (01/26/23 1191)   norepinephrine (LEVOPHED) Adult infusion Stopped (01/21/23 1458)    Assessment: 59 yoF with hx bladder/lung cancer, COPD who presented to the ED on 01/11/23 with poor oral intake, n/v and SOB. She was subsequently found to have postobstructive PNA and new onset afib with RVR. She was started in Eliquis for afib on 01/12/23, but held after doses on 5/4, transitioned to heparin on 01/16/23 for bronch with biopsies 5/7, heparin resumed post procedure on 01/18/23 while she remains intubated.   Today, 01/26/2023: - Heparin level 0.39, therapeutic on 1350 units/hr - Hgb low but stable, plts WNL -RN reports no s/s bleeding or any IV complications  Goal of Therapy:  Heparin level 0.3-0.7 units/ml aPTT 66-102  seconds Monitor platelets by anticoagulation protocol: Yes   Plan:  Continue heparin IV infusion at 1350 units/hr Confirmatory heparin level in 6 hours Daily heparin level and CBC Follow up long-term anticoagulation plan   Lynann Beaver PharmD, BCPS [facility] main pharmacy [phone] 01/26/2023 1:24 PM

## 2023-01-26 NOTE — Progress Notes (Signed)
Pt transported to radiation on LTV with no complications.

## 2023-01-26 NOTE — Progress Notes (Addendum)
ANTICOAGULATION CONSULT NOTE - Follow Up Consult  Pharmacy Consult for Heparin IV Indication: atrial fibrillation  Allergies  Allergen Reactions   Codeine Nausea And Vomiting and Other (See Comments)    Hallucinations   Prednisone Other (See Comments)    'Made me feel funny inside my head"-pt cannot be specific    Patient Measurements: Height: 5\' 8"  (172.7 cm) Weight: 83.4 kg (183 lb 13.8 oz) IBW/kg (Calculated) : 63.9 Heparin Dosing Weight: 81mg   Vital Signs: Temp: 99.4 F (37.4 C) (05/16 0400) Temp Source: Axillary (05/16 0400) BP: 114/48 (05/16 0400) Pulse Rate: 79 (05/16 0400)  Labs: Recent Labs    01/24/23 0515 01/25/23 0400 01/26/23 0415  HGB 7.8* 8.7* 8.3*  HCT 26.5* 28.6* 27.5*  PLT 301 336 359  HEPARINUNFRC 0.32 0.43 0.28*  CREATININE 0.84 0.95 0.96     Estimated Creatinine Clearance: 64.4 mL/min (by C-G formula based on SCr of 0.96 mg/dL).   Medications:  Infusions:   sodium chloride 10 mL/hr at 01/25/23 2310   dexmedetomidine (PRECEDEX) IV infusion 0.9 mcg/kg/hr (01/26/23 0436)   feeding supplement (OSMOLITE 1.5 CAL) 1,000 mL (01/26/23 0405)   fentaNYL infusion INTRAVENOUS 175 mcg/hr (01/26/23 0443)   heparin 1,200 Units/hr (01/26/23 0105)   norepinephrine (LEVOPHED) Adult infusion Stopped (01/21/23 1458)    Assessment: 56 yoF with hx bladder/lung cancer, COPD who presented to the ED on 01/11/23 with poor oral intake, n/v and SOB. She was subsequently found to have postobstructive PNA and new onset afib with RVR. She was started in Eliquis for afib on 01/12/23, but held after doses on 5/4, transitioned to heparin on 01/16/23 for bronch with biopsies 5/7, heparin resumed post procedure on 01/18/23 while she remains intubated.   Today, 01/26/2023: - Heparin level 0.28 sub-therapeutic on 1200 units/hr - Hgb low but stable, plts WNL -RN reports a little pink tinge to sputum when suctioned at beginning of shift  Goal of Therapy:  Heparin level 0.3-0.7  units/ml aPTT 66-102 seconds Monitor platelets by anticoagulation protocol: Yes   Plan:  Increase heparin drip to 1350 units/hr Heparin level in 6 hours Daily heparin level, and CBC Follow up long-term anticoagulation plans   Arley Phenix RPh 01/26/2023, 5:13 AM

## 2023-01-26 NOTE — Procedures (Signed)
Intubation Procedure Note  [name]  [id number]  01-14-1956  Date:01/26/23  Time:5:02 PM   Provider Performing:Keven Osborn B [name]    Procedure: Intubation (31500)  Indication(s) Respiratory Failure  Consent Unable to obtain consent due to emergent nature of procedure.   Anesthesia Etomidate, Versed, and Rocuronium   Time Out Verified patient identification, verified procedure, site/side was marked, verified correct patient position, special equipment/implants available, medications/allergies/relevant history reviewed, required imaging and test results available.   Sterile Technique Usual hand hygeine, masks, and gloves were used   Procedure Description Patient positioned in bed supine.  Sedation given as noted above.  Patient had endotracheal tube exchange performed with  bougie .   Number of attempts was 1.  Colorimetric CO2 detector was consistent with tracheal placement. Blood clot was noted to be occluding existing ET tube. Patient easily bagged with new ET tube in place with bilateral breath sounds.   Complications/Tolerance None; patient tolerated the procedure well. Chest X-ray is ordered to verify placement.   EBL Minimal   Specimen(s) None

## 2023-01-26 NOTE — Progress Notes (Addendum)
Asissted  CCM with ETT exchange due to the inability to suction PT via ETT (blockage) and peak pressure >40. Exchange was uneventful.

## 2023-01-26 NOTE — Progress Notes (Signed)
The proposed treatment discussed in conference is for discussion purpose only and is not a binding recommendation.  The patients have not been physically examined, or presented with their treatment options.  Therefore, final treatment plans cannot be decided.  

## 2023-01-26 NOTE — Progress Notes (Signed)
Patient experiencing increased indigestion/nausea. PRN Zofran and 1x Phenergan dose administered. Tube feeding rate not advanced at this time. Continues at 29mL/hr.

## 2023-01-26 NOTE — Progress Notes (Signed)
PT Cancellation Note  Patient Details Name: [name] MRN: [id number] DOB: 1956-07-04   Cancelled Treatment:    Reason Eval/Treat Not Completed: Medical issues which prohibited therapy, trial of weaning, back on vent. Will follow.  Blanchard Kelch PT Acute Rehabilitation Services Office [phone] Weekend pager-[phone]    Rada Hay 01/26/2023, 3:10 PM

## 2023-01-26 NOTE — Progress Notes (Addendum)
Patient restless, agitated, and having throat pain throughout the night, fentanyl boluses and rate increased as well as precedex but not effective. Patient hitting on side rail of bed, wanting to be suctioned, oral care, repositioned every 15-30 minutes. Patient educated on the risk of bleeding if continuous suctioning is done when she has minimal secretions. When asked if she felt like the ETT was irritating and cause pain making her feel like suctioning is required and she nods yes. PRN versed administered, patient continue to be restless, agitated, RASS score of 1-2.

## 2023-01-26 NOTE — Progress Notes (Signed)
Patients vent alarming, RT called to bedside.   Unable to advance suction catheter, pt in respiratory distress. MD at bedside.  ET tube exchange meds given per MD order:   2 Versed @1622  20 Etomidate @1623   100 Roc @ 1624

## 2023-01-26 NOTE — Progress Notes (Signed)
PCCM Update:  Called emergently to the bedside by RT. There was difficulty passing the suction catheter fully through the ET tube. Patient was in distress with oxygen desaturations into the 50s. She was sedated and bagged with improvement of her oxygen saturations.   ET tube was exchanged over a bougie with thick bloody secretions clogging ET tube.   Patient tolerated procedure well. [name] updated at bedside.   Discussed case with Dr. Merrily Pew regarding tracheostomy. Will plan for tracheostomy placement at bedside tomorrow afternoon. [name] is ok with tracheostomy.  Melody Comas, MD [facility] Office: [phone]   See Amion for personal pager PCCM on call pager [phone] until 7pm. Please call Elink 7p-7a. [phone]

## 2023-01-26 NOTE — Progress Notes (Signed)
ANTICOAGULATION CONSULT NOTE - Follow Up Consult  Pharmacy Consult for Heparin IV Indication: atrial fibrillation  Allergies  Allergen Reactions   Codeine Nausea And Vomiting and Other (See Comments)    Hallucinations   Prednisone Other (See Comments)    'Made me feel funny inside my head"-pt cannot be specific    Patient Measurements: Height: 5\' 8"  (172.7 cm) Weight: 83.4 kg (183 lb 13.8 oz) IBW/kg (Calculated) : 63.9 Heparin Dosing Weight: 81mg   Vital Signs: Temp: 98.4 F (36.9 C) (05/16 1600) Temp Source: Axillary (05/16 1600) BP: 109/60 (05/16 1900) Pulse Rate: 64 (05/16 1900)  Labs: Recent Labs    01/24/23 0515 01/25/23 0400 01/26/23 0415 01/26/23 1148 01/26/23 1812  HGB 7.8* 8.7* 8.3*  --   --   HCT 26.5* 28.6* 27.5*  --   --   PLT 301 336 359  --   --   HEPARINUNFRC 0.32 0.43 0.28* 0.39 0.48  CREATININE 0.84 0.95 0.96  --   --      Estimated Creatinine Clearance: 64.4 mL/min (by C-G formula based on SCr of 0.96 mg/dL).   Medications:  Infusions:   sodium chloride 10 mL/hr at 01/26/23 1931   dexmedetomidine (PRECEDEX) IV infusion 1.1 mcg/kg/hr (01/26/23 1955)   feeding supplement (OSMOLITE 1.5 CAL) 40 mL/hr at 01/26/23 1931   fentaNYL infusion INTRAVENOUS 150 mcg/hr (01/26/23 1931)   heparin 1,350 Units/hr (01/26/23 1931)    Assessment: 80 yoF with hx bladder/lung cancer, COPD who presented to the ED on 01/11/23 with poor oral intake, n/v and SOB. She was subsequently found to have postobstructive PNA and new onset afib with RVR. She was started in Eliquis for afib on 01/12/23, but held after doses on 5/4, transitioned to heparin on 01/16/23 for bronch with biopsies 5/7, heparin resumed post procedure on 01/18/23 while she remains intubated.   Today, 01/26/2023: - Heparin level 0.48, therapeutic on 1350 units/hr - Hgb low but stable, plts WNL -RN reports no s/s bleeding or any IV complications  Goal of Therapy:  Heparin level 0.3-0.7 units/ml aPTT 66-102  seconds Monitor platelets by anticoagulation protocol: Yes   Plan:  Continue heparin IV infusion at 1350 units/hr Daily heparin level and CBC Follow up long-term anticoagulation plan   Adalberto Cole, PharmD, BCPS 01/26/2023 8:00 PM

## 2023-01-27 ENCOUNTER — Ambulatory Visit: Payer: 59 | Admitting: Acute Care

## 2023-01-27 ENCOUNTER — Ambulatory Visit
Admission: RE | Admit: 2023-01-27 | Discharge: 2023-01-27 | Disposition: A | Discharge status: Home / Self Care | Payer: 59 | Source: Ambulatory Visit | Attending: Radiation Oncology | Admitting: Radiation Oncology

## 2023-01-27 ENCOUNTER — Other Ambulatory Visit: Payer: Self-pay

## 2023-01-27 ENCOUNTER — Inpatient Hospital Stay (HOSPITAL_COMMUNITY): Payer: 59

## 2023-01-27 ENCOUNTER — Ambulatory Visit: Payer: 59

## 2023-01-27 DIAGNOSIS — C3432 Malignant neoplasm of lower lobe, left bronchus or lung: Secondary | ICD-10-CM | POA: Diagnosis not present

## 2023-01-27 DIAGNOSIS — Z87891 Personal history of nicotine dependence: Secondary | ICD-10-CM | POA: Diagnosis not present

## 2023-01-27 DIAGNOSIS — J9601 Acute respiratory failure with hypoxia: Secondary | ICD-10-CM | POA: Diagnosis not present

## 2023-01-27 DIAGNOSIS — J189 Pneumonia, unspecified organism: Secondary | ICD-10-CM | POA: Diagnosis not present

## 2023-01-27 DIAGNOSIS — Z510 Encounter for antineoplastic radiation therapy: Secondary | ICD-10-CM | POA: Diagnosis not present

## 2023-01-27 LAB — BASIC METABOLIC PANEL
Anion gap: 8 (ref 5–15)
BUN: 37 mg/dL — ABNORMAL HIGH (ref 8–23)
CO2: 30 mmol/L (ref 22–32)
Calcium: 8.6 mg/dL — ABNORMAL LOW (ref 8.9–10.3)
Chloride: 98 mmol/L (ref 98–111)
Creatinine, Ser: 0.89 mg/dL (ref 0.44–1.00)
GFR, Estimated: >60 mL/min (ref >60)
Glucose, Bld: 153 mg/dL — ABNORMAL HIGH (ref 70–99)
Potassium: 4.3 mmol/L (ref 3.5–5.1)
Sodium: 136 mmol/L (ref 135–145)

## 2023-01-27 LAB — RAD ONC ARIA SESSION SUMMARY
Course Elapsed Days: 9
Plan Fractions Treated to Date: 5
Plan Prescribed Dose Per Fraction: 2 Gy
Plan Total Fractions Prescribed: 27
Plan Total Prescribed Dose: 54 Gy
Reference Point Dosage Given to Date: 10 Gy
Reference Point Session Dosage Given: 2 Gy
Session Number: 8

## 2023-01-27 LAB — GLUCOSE, CAPILLARY
Glucose-Capillary: 124 mg/dL — ABNORMAL HIGH (ref 70–99)
Glucose-Capillary: 125 mg/dL — ABNORMAL HIGH (ref 70–99)
Glucose-Capillary: 139 mg/dL — ABNORMAL HIGH (ref 70–99)
Glucose-Capillary: 143 mg/dL — ABNORMAL HIGH (ref 70–99)
Glucose-Capillary: 146 mg/dL — ABNORMAL HIGH (ref 70–99)
Glucose-Capillary: 154 mg/dL — ABNORMAL HIGH (ref 70–99)

## 2023-01-27 LAB — PHOSPHORUS: Phosphorus: 4.4 mg/dL (ref 2.5–4.6)

## 2023-01-27 LAB — MAGNESIUM: Magnesium: 1.9 mg/dL (ref 1.7–2.4)

## 2023-01-27 LAB — CULTURE, BAL-QUANTITATIVE W GRAM STAIN

## 2023-01-27 LAB — CBC
HCT: 27.3 % — ABNORMAL LOW (ref 36.0–46.0)
Hemoglobin: 8.4 g/dL — ABNORMAL LOW (ref 12.0–15.0)
MCH: 27.1 pg (ref 26.0–34.0)
MCHC: 30.8 g/dL (ref 30.0–36.0)
MCV: 88.1 fL (ref 80.0–100.0)
Platelets: 367 10*3/uL (ref 150–400)
RBC: 3.1 MIL/uL — ABNORMAL LOW (ref 3.87–5.11)
RDW: 14.6 % (ref 11.5–15.5)
WBC: 11.8 10*3/uL — ABNORMAL HIGH (ref 4.0–10.5)
nRBC: 0 % (ref 0.0–0.2)

## 2023-01-27 LAB — HEPARIN LEVEL (UNFRACTIONATED): Heparin Unfractionated: 0.61 IU/mL (ref 0.30–0.70)

## 2023-01-27 MED ORDER — FUROSEMIDE 10 MG/ML IJ SOLN
40.0000 mg | Freq: Four times a day (QID) | INTRAMUSCULAR | Status: AC
  Administered 2023-01-27 (×2): 40 mg via INTRAVENOUS
  Filled 2023-01-27 (×2): qty 4

## 2023-01-27 MED ORDER — ROCURONIUM BROMIDE 10 MG/ML (PF) SYRINGE
PREFILLED_SYRINGE | INTRAVENOUS | Status: AC
  Filled 2023-01-27: qty 10

## 2023-01-27 MED ORDER — PHENYLEPHRINE 80 MCG/ML (10ML) SYRINGE FOR IV PUSH (FOR BLOOD PRESSURE SUPPORT)
PREFILLED_SYRINGE | INTRAVENOUS | Status: AC
  Filled 2023-01-27: qty 10

## 2023-01-27 MED ORDER — LIDOCAINE-EPINEPHRINE 1 %-1:100000 IJ SOLN
30.0000 mL | Freq: Once | INTRAMUSCULAR | Status: AC
  Administered 2023-01-27: 30 mL
  Filled 2023-01-27: qty 30

## 2023-01-27 MED ORDER — HEPARIN (PORCINE) 25000 UT/250ML-% IV SOLN
1350.0000 [IU]/h | INTRAVENOUS | Status: DC
  Administered 2023-01-27 – 2023-01-29 (×4): 1350 [IU]/h via INTRAVENOUS
  Filled 2023-01-27 (×3): qty 250

## 2023-01-27 MED ORDER — MAGNESIUM SULFATE 2 GM/50ML IV SOLN
2.0000 g | Freq: Once | INTRAVENOUS | Status: AC
  Administered 2023-01-27: 2 g via INTRAVENOUS
  Filled 2023-01-27: qty 50

## 2023-01-27 NOTE — Progress Notes (Signed)
ANTICOAGULATION CONSULT NOTE - Follow Up Consult  Pharmacy Consult for Heparin IV Indication: atrial fibrillation  Allergies  Allergen Reactions   Codeine Nausea And Vomiting and Other (See Comments)    Hallucinations   Prednisone Other (See Comments)    'Made me feel funny inside my head"-pt cannot be specific    Patient Measurements: Height: 5\' 8"  (172.7 cm) Weight: 82.8 kg (182 lb 8.7 oz) IBW/kg (Calculated) : 63.9 Heparin Dosing Weight: 81mg   Vital Signs: Temp: 97.8 F (36.6 C) (05/17 1417) Temp Source: Axillary (05/17 1417) BP: 125/58 (05/17 1200) Pulse Rate: 64 (05/17 1200)  Labs: Recent Labs    01/25/23 0400 01/26/23 0415 01/26/23 1148 01/26/23 1812 01/27/23 0503  HGB 8.7* 8.3*  --   --  8.4*  HCT 28.6* 27.5*  --   --  27.3*  PLT 336 359  --   --  367  HEPARINUNFRC 0.43 0.28* 0.39 0.48 0.61  CREATININE 0.95 0.96  --   --  0.89     Estimated Creatinine Clearance: 69.2 mL/min (by C-G formula based on SCr of 0.89 mg/dL).   Medications:  Infusions:   sodium chloride 10 mL/hr at 01/27/23 1200   dexmedetomidine (PRECEDEX) IV infusion 1 mcg/kg/hr (01/27/23 1200)   feeding supplement (OSMOLITE 1.5 CAL) Stopped (01/27/23 0755)   fentaNYL infusion INTRAVENOUS 150 mcg/hr (01/27/23 1200)    Assessment: 65 yoF with hx bladder/lung cancer, COPD who presented to the ED on 01/11/23 with poor oral intake, n/v and SOB. She was subsequently found to have postobstructive PNA and new onset afib with RVR. She was started in Eliquis for afib on 01/12/23, but held after doses on 5/4, transitioned to heparin on 01/16/23 for bronch with biopsies 5/7, heparin resumed post procedure on 01/18/23.  5/17 PM Update:  Heparin held at 8am today for trach planned for 14:00.  OK to resume heparin post-procedure at 8pm per MD/NP.  Heparin level was previously therapeutic on heparin 1350 units/hr CBC:  Hgb low but stable, plts WNL RN reports no s/s bleeding or any IV complications  Goal of  Therapy:  Heparin level 0.3-0.7 units/ml Monitor platelets by anticoagulation protocol: Yes   Plan:  At 20:00, resume heparin IV 1350 units/hr Heparin level in 6 hours Daily heparin level and CBC  F/u long term anticoagulation plans   Lynann Beaver PharmD, BCPS [facility] main pharmacy [phone] 01/27/2023 3:54 PM

## 2023-01-27 NOTE — Progress Notes (Signed)
OT Cancellation Note  Patient Details Name: [name] MRN: [id number] DOB: 02/17/1956   Cancelled Treatment:    Reason Eval/Treat Not Completed: Medical issues which prohibited therapy- Pt off unit for RXT. RN informed OT that pt would also be getting trach today and would be appropriate to hold PT and OT until tomorrow.   Theodoro Clock 01/27/2023, 10:35 AM

## 2023-01-27 NOTE — Procedures (Signed)
Percutaneous Tracheostomy Procedure Note   [name]  [id number]  02-25-1956  Date:01/27/23  Time:2:42 PM   Provider Performing:Maythe Deramo  Procedure: Percutaneous Tracheostomy with Bronchoscopic Guidance (47829)  Indication(s) Acute respiratory failure  Consent Risks of the procedure as well as the alternatives and risks of each were explained to the patient and/or caregiver.  Consent for the procedure was obtained.  Anesthesia Etomidate, Versed, Fentanyl, Vecuronium   Time Out Verified patient identification, verified procedure, site/side was marked, verified correct patient position, special equipment/implants available, medications/allergies/relevant history reviewed, required imaging and test results available.   Sterile Technique Maximal sterile technique including sterile barrier drape, hand hygiene, sterile gown, sterile gloves, mask, hair covering.    Procedure Description Appropriate anatomy identified by palpation.  Patient's neck prepped and draped in sterile fashion.  1% lidocaine with epinephrine was used to anesthetize skin overlying neck.  1.5cm incision made and blunt dissection performed until tracheal rings could be easily palpated.   Then a size 6 Shiley tracheostomy was placed under bronchoscopic visualization using usual Seldinger technique and serial dilation.   Bronchoscope confirmed placement above the carina.  Tracheostomy was sutured in place with adhesive pad to protect skin under pressure.    Patient connected to ventilator.   Complications/Tolerance None; patient tolerated the procedure well. Chest X-ray is ordered to confirm no post-procedural complication.   EBL Minimal   Specimen(s) None

## 2023-01-27 NOTE — Progress Notes (Addendum)
NAME:  [name], MRN:  [id number], DOB:  09/08/1956, LOS: 15 ADMISSION DATE:  01/11/2023, CONSULTATION DATE:  01/16/2023 REFERRING MD: FMTS, CHIEF COMPLAINT: Acute Respiratory Failure in the setting of Lung Cancer, COPD and infiltrate on CXR   History of Present Illness:  67 year old woman who presented to [facility] 5/1 for nausea/vomiting/diarrhea and abdominal pain x 2 weeks. PMHx significant for COPD, lung CA (adenocarcinoma of LLL, biopsy planned for 5/7), bladder CA (s/p TURBT), GERD, and panic/anxiety. Admitted for AHRF in setting of CAP/COPD exacerbation to FMTS at [facility].   Found to have postobstructive PNA (RML/RLL) and R hilar mass with progressive respiratory failure requiring intubation 5/6. Patient also experienced panic attacks/anxiety leading to exacerbated WOB and VCD with associated stridor. Of note, patient is a DNR; however she endorses that she is not ready to die and wanted to be intubated. (See Prior Note documentation 01/16/2023). Transferred to [facility] 5/7 for ongoing oncological care.  Pertinent Medical History:   Past Medical History:  Diagnosis Date   Abnormal CT lung screening 07/10/2020   Anxiety    Anxiety state 02/28/2012   GAD7 : 6  PHQ9 : 6   Arthritis    Bladder cancer (HCC) 09/2020   urologist--- dr Berneice Heinrich, incidental finding on PET scan 12/ 2021,  s/p TURBT 09-18-2020 high grade papillary urothelial    Cancer related pain 03/11/2022   CAP (community acquired pneumonia) 04/20/2022   Centrilobular emphysema (HCC)    Chronic left shoulder pain 04/29/2021   Close exposure to COVID-19 virus 05/03/2019   Constipation 04/09/2019   Costochondritis, acute 08/14/2020   DDD (degenerative disc disease), lumbar    Depression    DOE (dyspnea on exertion)    10-29-2020  per pt can do house work without sob, when walks/ stairs stops frequently  (stated recovers quickly when stops)   Emphysema lung (HCC)    Epidermoid cyst 10/29/2018   Recurrent. Located on left hip at iliac  crest 2" anterior to midaxillary line. Removed 2013, 2020, 2022.    Epigastric pain 11/16/2020   Fibroid 2003   GERD (gastroesophageal reflux disease)    History of radiation therapy    Left lung- 10/06/20-10/16/20- Dr. Antony Blackbird   Leg cramping 11/13/2019   Lumbar spine pain 01/09/2015   Menopausal vaginal dryness 11/13/2019   Multiple closed fractures of ribs of left side 08/20/2022   Nocturnal enuresis 06/23/2020   Chronic overnight "leaking" of unknown source. Likely urine, but reports no color or odor. Volume enough for liners or light pads. Occurs every night.    Primary adenocarcinoma of lower lobe of left lung (HCC) 07/2020   oncologist--- dr Arbutus Ped---  dx 11/ 2021 bilateral lower lobe masses,  s/p bronchoscopy w/ bx's 07-28-2020 malignancy cells on left ;  completed SBRT bilateral lower lung 10-16-2020 5 fractions   Radiation-induced dermatitis    on 10-29-2020 pt complaint stomach and chest areas,  completed SBRT 10-16-2020   Seasonal allergies    Stage 3 severe COPD by GOLD classification [facility])    pulmonologist--- dr Tonia Brooms,  using anoro inhaler daily   Wears glasses    Significant Hospital Events: Including procedures, antibiotic start and stop dates in addition to other pertinent events   01/16/2023 Transferred to ICU for acute Respiratory Failure , intubated  5/7 Bronch by Icard. significant extrinsic compression of the airways in the right lower lobe and middle lobe. This is likely malignant obstruction. Bx sent. Transferred to [facility] for rad/onc eval. PICC placed 5/8 Stable still  high PEEP/FiO2 needs. Rad/onc consult requested  5/10 Tolerating SBT trial this AM on low-dose fentanyl and Precedex.  Propofol DC'd due to high triglycerides 5/11 Tolerates SBT's, pressure support 10/5 5/12 Off of pressors. Remains on vent, sedated with Precedex/Fentanyl. PMT consulted. 5/13 Weaning on PSV 14/8. > 10L positive over the course of admission. Diuresing. 5/14 Weaning on PSV. Ongoing  diuresis. 5/15 Tolerating SBT, PSV 5/5, following commands. Continue diuresis, Foley placed for persistent urinary retention. Extubated, decompensated quickly after requiring reintubation. DHT placed. 5/16 Agitated overnight, c/o throat pain with ETT, RASS 1-2 despite sedation. Failed extubation 5/15. Tolerating SBT PSV 10/5, FiO2 35%. ~1630 desaturated with inability to pass Ballard suction/difficulty bagging; urgent tube exchange completed with bougie and blood clot removed with improvement in oxygenation/ventilation. 5/17 Tracheostomy planned for today.  Interim History / Subjective:  No significant events Mildly agitated/restless overnight, improved this morning Communicating via paper/pen Had radiation today, tolerated well Tracheostomy planned for today, 5/17 Daughter/patient aware and consented Heparin/TF held 0800 for procedure  Objective:  Blood pressure (!) 133/104, pulse 85, temperature 97.9 F (36.6 C), temperature source Axillary, resp. rate 20, height 5\' 8"  (1.727 m), weight 82.8 kg, SpO2 91 %.    Vent Mode: PRVC FiO2 (%):  [35 %-40 %] 40 % Set Rate:  [14 bmp] 14 bmp Vt Set:  [510 mL] 510 mL PEEP:  [5 cmH20] 5 cmH20 Pressure Support:  [10 cmH20] 10 cmH20 Plateau Pressure:  [16 cmH20-18 cmH20] 16 cmH20   Intake/Output Summary (Last 24 hours) at 01/27/2023 0735 Last data filed at 01/27/2023 7846 Gross per 24 hour  Intake 2547.78 ml  Output 3130 ml  Net -582.22 ml    Filed Weights   01/25/23 0419 01/26/23 0401 01/27/23 0500  Weight: 81.5 kg 83.4 kg 82.8 kg   Physical Examination: General: Acute-on-chronically ill-appearing middle-aged woman in NAD. HEENT: [place]/AT, anicteric sclera, PERRL, moist mucous membranes. ETT in place. Neuro: Awake, oriented x 4. Answering questions appropriately with mouthing words/writing on paper. Responds to verbal stimuli. Following commands consistently. Moves all 4 extremities spontaneously. Generalized weakness. CV: RRR, no  m/g/r. PULM: Breathing even and unlabored on vent (PEEP 5, FiO2 45%). Lung fields slightly diminished on L, otherwise clear. GI: Soft, nontender, nondistended. Normoactive bowel sounds. Extremities: No LE edema noted. Skin: Warm/dry, no rashes.  Resolved Hospital Problem List:  Hypertriglyceridemia due to propofol Hyponatremia Hypotension, Drug induced from sedation   Assessment & Plan:   Acute respiratory failure in setting of COPD, recurrent lung cancer w/ post obstructive atelectasis +/- post obstructive PNA. Intubated 5/7,  Zosyn completed 5/13 R hilar lung mass History of LLL adenocarcinoma Pulmonary edema Small bilateral pleural effusions. stable COPD Failed extubation 5/15; rapidly decompensated after ETT removed with tachypnea to 40s/poor air movement, stridor requiring Decadron, tachycardia to 160s, reintubated same day. Tube exchange completed 5/16 for clot blocking ETT. Vocal cord edema noted on reintubation despite +cuff leak prior to extubation. - Tracheostomy planned for today, 5/17 - Continue full vent support (4-8cc/kg IBW) - Wean FiO2 for O2 sat > 90% - Continue steroids (Decadron) - Bronchodilators (Brovanam Yupelri, budesonide) - Diuresis (Lasix) as tolerated, +9L/admission (improved from +11L) - Daily WUA/SBT - VAP bundle - Pulmonary hygiene - PAD protocol for sedation: Precedex, Fentanyl, and Versed for goal RASS 0 to -1 - Ongoing SBRT per Rad Onc - PMT following, appreciate assistance  Severe anxiety History of panic attacks - PAD protocol as above - Scheduled oxycodone, Klonopin - Continue home Celexa  Paroxysmal atrial fibrillation  Anticoagulated at baseline with apixaban. - Heparin gtt per pharmacy - Cardiac monitoring - Optimize electrolytes for K > 4, Mg > 2  At risk for malnutrition  - TF per RD/Nutrition, appreciate assistance - DHT  Hyperglycemia - SSI - CBGs Q4H - Goal CBG 140-180  Anemia of chronic disease - Trend H&H - Monitor  for signs of active bleeding - Transfuse for Hgb < 7.0 or hemodynamically significant bleeding\  Urinary retention likely 2/2 sedation - Bethanechol 10mg  TID - Keep Foley for now, given persistent retention/need for multiple I&Os  Best Practice: (right click and "Reselect all SmartList Selections" daily)   Diet/type: tubefeeds DVT prophylaxis: heparin gtt (held for trach) GI prophylaxis: H2B Lines: Central line - PICC Foley:  Yes Code Status:  full code Last date of multidisciplinary goals of care discussion 5/11 discussion with daughters, they have unrealistic goal of taking mom home with a tracheostomy and vent if needed.  Desire full CODE STATUS and reintubation.  Critical care time:   The patient is critically ill with multiple organ system failure and requires high complexity decision making for assessment and support, frequent evaluation and titration of therapies, advanced monitoring, review of radiographic studies and interpretation of complex data.   Critical Care Time devoted to patient care services, exclusive of separately billable procedures, described in this note is 37 minutes.  Tim Lair, PA-C [facility] 01/27/23 7:35 AM  Please see [email or web address] for pager details.  From 7A-7P if no response, please call [phone] After hours, please call ELink [phone]

## 2023-01-27 NOTE — Progress Notes (Signed)
PT Cancellation Note  Patient Details Name: [name] MRN: [id number] DOB: [birth year]   Cancelled Treatment:    Reason Eval/Treat Not Completed: Medical issues which prohibited therapy (per RN pt getting trach today, requested PT hold until tomorrow. Will follow.)  Tamala Ser PT 01/27/2023  Acute Rehabilitation Services  Office [phone]

## 2023-01-27 NOTE — Progress Notes (Signed)
ANTICOAGULATION CONSULT NOTE - Follow Up Consult  Pharmacy Consult for Heparin IV Indication: atrial fibrillation  Allergies  Allergen Reactions   Codeine Nausea And Vomiting and Other (See Comments)    Hallucinations   Prednisone Other (See Comments)    'Made me feel funny inside my head"-pt cannot be specific    Patient Measurements: Height: 5\' 8"  (172.7 cm) Weight: 82.8 kg (182 lb 8.7 oz) IBW/kg (Calculated) : 63.9 Heparin Dosing Weight: 81mg   Vital Signs: Temp: 97.9 F (36.6 C) (05/17 0400) Temp Source: Axillary (05/17 0400) BP: 133/104 (05/17 0600) Pulse Rate: 85 (05/17 0600)  Labs: Recent Labs    01/25/23 0400 01/26/23 0415 01/26/23 1148 01/26/23 1812 01/27/23 0503  HGB 8.7* 8.3*  --   --  8.4*  HCT 28.6* 27.5*  --   --  27.3*  PLT 336 359  --   --  367  HEPARINUNFRC 0.43 0.28* 0.39 0.48 0.61  CREATININE 0.95 0.96  --   --  0.89     Estimated Creatinine Clearance: 69.2 mL/min (by C-G formula based on SCr of 0.89 mg/dL).   Medications:  Infusions:   sodium chloride 10 mL/hr at 01/27/23 0634   dexmedetomidine (PRECEDEX) IV infusion 1 mcg/kg/hr (01/27/23 1610)   feeding supplement (OSMOLITE 1.5 CAL) 40 mL/hr at 01/27/23 0634   fentaNYL infusion INTRAVENOUS 150 mcg/hr (01/27/23 0649)   heparin 1,350 Units/hr (01/27/23 0634)   magnesium sulfate bolus IVPB      Assessment: 89 yoF with hx bladder/lung cancer, COPD who presented to the ED on 01/11/23 with poor oral intake, n/v and SOB. She was subsequently found to have postobstructive PNA and new onset afib with RVR. She was started in Eliquis for afib on 01/12/23, but held after doses on 5/4, transitioned to heparin on 01/16/23 for bronch with biopsies 5/7, heparin resumed post procedure on 01/18/23 while she remains intubated.   Today, 01/27/2023: Heparin level remains therapeutic this AM on 1350 units/hr Hgb low but stable, plts WNL RN reports no s/s bleeding or any IV complications Heparin to stop at 0800 today  in anticipation of tracheostomy at 1400  Goal of Therapy:  Heparin level 0.3-0.7 units/ml Monitor platelets by anticoagulation protocol: Yes   Plan:  Continue heparin IV infusion at 1350 units/hr, stopping at 08:00 today F/u tracheostomy completion and timing for resuming heparin vs DOAC postprocedurally Daily heparin level and CBC if resuming heparin  Bernadene Person, PharmD, BCPS [phone] 01/27/2023, 7:21 AM

## 2023-01-27 NOTE — Procedures (Signed)
Diagnostic Bronchoscopy  [name]  [id number]  03-02-1956  Date:01/27/23  Time:2:45 PM   Provider Performing:Allyssia Skluzacek S Davien Malone   Procedure: Diagnostic Bronchoscopy (40981) with BAL  Indication(s) Assist with direct visualization of tracheostomy placement Obtain bronchial cultures   Consent Risks of the procedure as well as the alternatives and risks of each were explained to the patient and/or caregiver.  Consent for the procedure was obtained.   Anesthesia See separate tracheostomy note   Time Out Verified patient identification, verified procedure, site/side was marked, verified correct patient position, special equipment/implants available, medications/allergies/relevant history reviewed, required imaging and test results available.   Sterile Technique Usual hand hygiene, masks, gowns, and gloves were used   Procedure Description Bronchoscope advanced through endotracheal tube and into airway.  After suctioning out tracheal secretions, bronchoscope used to provide direct visualization of tracheostomy placement. Left-sided airways were patent.  There was significant edema and some narrowing of the bronchus intermedius, right lower lobe and right middle lobe airways.  There was fibrinous clot versus necrotic tissue in the right lower lobe airways that was suctioned.  A BAL was performed in the right lower lobe and sent for culture   Complications/Tolerance None; patient tolerated the procedure well.   EBL None  Specimen(s) Right lower lobe BAL for culture  Levy Pupa, MD, PhD 01/27/2023, 2:47 PM [facility] [phone] or if no answer before 7:00PM call [phone] For any issues after 7:00PM please call eLink [phone]

## 2023-01-27 NOTE — Progress Notes (Signed)
[facility] ADULT ICU REPLACEMENT PROTOCOL   The patient does apply for the [facility] Adult ICU Electrolyte Replacment Protocol based on the criteria listed below:   1.Exclusion criteria: TCTS, ECMO, Dialysis, and Myasthenia Gravis patients 2. Is GFR >/= 30 ml/min? Yes.    Patient's GFR today is >60 3. Is SCr </= 2? Yes.   Patient's SCr is 0.89 mg/dL 4. Did SCr increase >/= 0.5 in 24 hours? No. 5.Pt's weight >40kg  Yes.   6. Abnormal electrolyte(s): mag 1.9  7. Electrolytes replaced per protocol 8.  Call MD STAT for K+ </= 2.5, Phos </= 1, or Mag </= 1 Physician:  protocol  Melvern Banker 01/27/2023 6:28 AM

## 2023-01-27 NOTE — Progress Notes (Signed)
Nutrition Follow-up  DOCUMENTATION CODES:   Severe malnutrition in context of acute illness/injury  INTERVENTION:  - Restart tube feeds when medically appropriate.  Consider restarting at 19mL/hr and advancing as tolerated to goal below: Osmolite 1.5 @ 55 ml/hr (1320 ml/day) PROSource TF20 60 ml daily - Provides 2060 kcal, 103 grams of protein, and 1006 ml of H2O.  - FWF per CCM/MD.  - MVI with minerals daily per tube.   - Monitor weight trends.   NUTRITION DIAGNOSIS:   Severe Malnutrition related to acute illness as evidenced by moderate fat depletion, moderate muscle depletion. *ongoing  GOAL:   Patient will meet greater than or equal to 90% of their needs *progressing with TF  MONITOR:   Vent status, Labs, Weight trends, TF tolerance  REASON FOR ASSESSMENT:   Ventilator, Consult Enteral/tube feeding initiation and management  ASSESSMENT:   67 y.o. female with PMHx including adenocarcinoma of LLL now with recurrence, COPD, and anxiety presents with acute on chronic hypoxemic respiratory failure likely multifactorial  5/2 Admit 5/6 Intubated; Osm 1.5 started at 57mL/hr 5/8 Osm 1.5 increased to goal of 63mL/hr 5/15 Extubated by quickly re-intubated; TF restarted at 49mL/hr due to xray showing stomach distension  Patient remains intubated. TF stopped this AM in preparation for trach this afternoon.   RN reports after restarting at 54mL/hr on 5/15, patient was not able to get past 74mL/hr yesterday due to complaints of nausea.  RN notes she has been receiving radiation to lungs and that she will point to both stomach and chest area when asked where she is uncomfortable.  Question if radiation might be affecting patient's tolerance. Patient had previously been tolerating TF regimen since reaching goal on 5/8.  Will monitor for tolerance once TF restarted.   Medications reviewed and include: Insulin, Remeron, MVI Fentanyl  Labs reviewed:  HA1C 6.0   Diet  Order:   Diet Order             Diet NPO time specified  Diet effective ____                   EDUCATION NEEDS:  Education needs have been addressed  Skin:  Skin Assessment: Reviewed RN Assessment  Last BM:  5/17 - rectal tube  Height:  Ht Readings from Last 1 Encounters:  01/17/23 5\' 8"  (1.727 m)   Weight:  Wt Readings from Last 1 Encounters:  01/27/23 82.8 kg   BMI:  Body mass index is 27.76 kg/m.  Estimated Nutritional Needs:  Kcal:  1900-2100 Protein:  95-120 g Fluid:  >1.9 L    Shelle Iron RD, LDN For contact information, refer to [facility].

## 2023-01-28 DIAGNOSIS — J9601 Acute respiratory failure with hypoxia: Secondary | ICD-10-CM | POA: Diagnosis not present

## 2023-01-28 LAB — CBC
HCT: 28.6 % — ABNORMAL LOW (ref 36.0–46.0)
Hemoglobin: 8.8 g/dL — ABNORMAL LOW (ref 12.0–15.0)
MCH: 27.1 pg (ref 26.0–34.0)
MCHC: 30.8 g/dL (ref 30.0–36.0)
MCV: 88 fL (ref 80.0–100.0)
Platelets: 413 10*3/uL — ABNORMAL HIGH (ref 150–400)
RBC: 3.25 MIL/uL — ABNORMAL LOW (ref 3.87–5.11)
RDW: 14.6 % (ref 11.5–15.5)
WBC: 12.1 10*3/uL — ABNORMAL HIGH (ref 4.0–10.5)
nRBC: 0 % (ref 0.0–0.2)

## 2023-01-28 LAB — HEPARIN LEVEL (UNFRACTIONATED)
Heparin Unfractionated: 0.56 IU/mL (ref 0.30–0.70)
Heparin Unfractionated: 0.59 IU/mL (ref 0.30–0.70)

## 2023-01-28 LAB — GLUCOSE, CAPILLARY
Glucose-Capillary: 104 mg/dL — ABNORMAL HIGH (ref 70–99)
Glucose-Capillary: 107 mg/dL — ABNORMAL HIGH (ref 70–99)
Glucose-Capillary: 112 mg/dL — ABNORMAL HIGH (ref 70–99)
Glucose-Capillary: 116 mg/dL — ABNORMAL HIGH (ref 70–99)
Glucose-Capillary: 116 mg/dL — ABNORMAL HIGH (ref 70–99)
Glucose-Capillary: 121 mg/dL — ABNORMAL HIGH (ref 70–99)
Glucose-Capillary: 135 mg/dL — ABNORMAL HIGH (ref 70–99)

## 2023-01-28 LAB — BASIC METABOLIC PANEL
Anion gap: 8 (ref 5–15)
BUN: 39 mg/dL — ABNORMAL HIGH (ref 8–23)
CO2: 31 mmol/L (ref 22–32)
Calcium: 8.9 mg/dL (ref 8.9–10.3)
Chloride: 97 mmol/L — ABNORMAL LOW (ref 98–111)
Creatinine, Ser: 0.95 mg/dL (ref 0.44–1.00)
GFR, Estimated: >60 mL/min (ref >60)
Glucose, Bld: 121 mg/dL — ABNORMAL HIGH (ref 70–99)
Potassium: 3.7 mmol/L (ref 3.5–5.1)
Sodium: 136 mmol/L (ref 135–145)

## 2023-01-28 LAB — PHOSPHORUS: Phosphorus: 3.7 mg/dL (ref 2.5–4.6)

## 2023-01-28 LAB — CULTURE, BAL-QUANTITATIVE W GRAM STAIN

## 2023-01-28 LAB — MAGNESIUM: Magnesium: 2.3 mg/dL (ref 1.7–2.4)

## 2023-01-28 MED ORDER — POTASSIUM CHLORIDE 10 MEQ/50ML IV SOLN
10.0000 meq | INTRAVENOUS | Status: AC
  Administered 2023-01-28 (×4): 10 meq via INTRAVENOUS
  Filled 2023-01-28 (×4): qty 50

## 2023-01-28 MED ORDER — FUROSEMIDE 10 MG/ML IJ SOLN
40.0000 mg | Freq: Four times a day (QID) | INTRAMUSCULAR | Status: AC
  Administered 2023-01-28 (×2): 40 mg via INTRAVENOUS
  Filled 2023-01-28 (×2): qty 4

## 2023-01-28 NOTE — Progress Notes (Signed)
ANTICOAGULATION CONSULT NOTE - Follow Up Consult  Pharmacy Consult for Heparin IV Indication: atrial fibrillation  Allergies  Allergen Reactions   Codeine Nausea And Vomiting and Other (See Comments)    Hallucinations   Prednisone Other (See Comments)    'Made me feel funny inside my head"-pt cannot be specific    Patient Measurements: Height: 5\' 8"  (172.7 cm) Weight: 78.3 kg (172 lb 9.9 oz) IBW/kg (Calculated) : 63.9 Heparin Dosing Weight: 81mg   Vital Signs: Temp: 97.8 F (36.6 C) (05/18 0422) Temp Source: Axillary (05/18 0422) BP: 124/71 (05/18 0900) Pulse Rate: 77 (05/18 0900)  Labs: Recent Labs    01/26/23 0415 01/26/23 1148 01/27/23 0503 01/28/23 0300 01/28/23 0923  HGB 8.3*  --  8.4* 8.8*  --   HCT 27.5*  --  27.3* 28.6*  --   PLT 359  --  367 413*  --   HEPARINUNFRC 0.28*   < > 0.61 0.56 0.59  CREATININE 0.96  --  0.89 0.95  --    < > = values in this interval not displayed.     Estimated Creatinine Clearance: 63.2 mL/min (by C-G formula based on SCr of 0.95 mg/dL).   Medications:  Infusions:   sodium chloride 10 mL/hr at 01/28/23 1010   dexmedetomidine (PRECEDEX) IV infusion 0.5 mcg/kg/hr (01/28/23 1010)   feeding supplement (OSMOLITE 1.5 CAL) 40 mL/hr at 01/28/23 1010   fentaNYL infusion INTRAVENOUS 150 mcg/hr (01/28/23 1010)   heparin 1,350 Units/hr (01/28/23 1010)    Assessment: 40 yoF with hx bladder/lung cancer, COPD who presented to the ED on 01/11/23 with poor oral intake, n/v and SOB. She was subsequently found to have postobstructive PNA and new onset afib with RVR. She was started in Eliquis for afib on 01/12/23, but held after doses on 5/4, transitioned to heparin on 01/16/23 for bronch with biopsies 5/7, heparin resumed post procedure on 01/18/23.  01/28/2023 HL remains therapeutic at 0.59 on 1350 units/hr Hgb low but stable, plts elevated No bleeding noted per RN  Goal of Therapy:  Heparin level 0.3-0.7 units/ml Monitor platelets by  anticoagulation protocol: Yes   Plan:  Continue heparin drip at 1350 units/hr Daily heparin level and CBC  F/u transition back to apixaban  Enzo Bi, PharmD, BCPS, BCIDP Clinical Pharmacist 01/28/2023 10:40 AM

## 2023-01-28 NOTE — Progress Notes (Signed)
Physical Therapy Treatment Patient Details Name: [name] MRN: [id number] DOB: July 01, 1956 Today's Date: 01/28/2023   History of Present Illness Pt is a 67 y.o. female admitted 01/11/23 with acute hypoxic respiratory failure secondary to post-obstructive RLL PNA. Course complicated by anxiety. Intubated 5/6. Plan for biopsy 5/7. Tracheostomy placed 01/27/23.  PMH includes R hilar mass (concern for recurrent NSCLC), COPD, afib, panic attacks/anxiety.    PT Comments    Pt admitted with above diagnosis. RN requested PT defer mobility and just do bed level exercises today. Pt noted to be on ventilator with tracheostomy placed yesterday. Pt was initially alert but often closed her eyes during the PT session, she responded to verbal/tactile stimulation. BUE/LE AAROM exercises performed in bed. Will reassess mobility when pt is medically ready.  Pt currently with functional limitations due to the deficits listed below (see PT Problem List). Pt will benefit from acute skilled PT to increase their independence and safety with mobility to allow discharge.      Recommendations for follow up therapy are one component of a multi-disciplinary discharge planning process, led by the attending physician.  Recommendations may be updated based on patient status, additional functional criteria and insurance authorization.  Follow Up Recommendations  Can patient physically be transported by private vehicle: No    Assistance Recommended at Discharge Frequent or constant Supervision/Assistance  Patient can return home with the following Assist for transportation;Help with stairs or ramp for entrance;A lot of help with walking and/or transfers;A lot of help with bathing/dressing/bathroom;Assistance with cooking/housework   Equipment Recommendations  Rollator (4 wheels)    Recommendations for Other Services       Precautions / Restrictions Precautions Precautions: Fall Precaution Comments: watch Spo2 (wears  2L O2 baseline) Restrictions Weight Bearing Restrictions: No     Mobility  Bed Mobility               General bed mobility comments: deferred per RN request, pt in chair position in bed, RN requested exercises only    Transfers                        Ambulation/Gait                   Stairs             Wheelchair Mobility    Modified Rankin (Stroke Patients Only)       Balance                                            Cognition Arousal/Alertness: Lethargic Behavior During Therapy: WFL for tasks assessed/performed, Flat affect Overall Cognitive Status: Within Functional Limits for tasks assessed                                 General Comments: on vent, not sedated, able to nod head yes/no; intermittently alert, then falling asleep, arouses to verbal/tactile stimulation        Exercises General Exercises - Upper Extremity Shoulder Flexion: Both, 10 reps, AAROM, Supine General Exercises - Lower Extremity Ankle Circles/Pumps: AAROM, Both, 15 reps, Supine Quad Sets: Limitations Quad Sets Limitations: attempted, but pt unable to perform Short Arc Quad: AAROM, Both, 10 reps, Supine Heel Slides: AAROM, Both, 10 reps, Supine Hip ABduction/ADduction: AAROM, Both, 10  reps, Supine    General Comments        Pertinent Vitals/Pain Pain Assessment Pain Assessment: No/denies pain Pain Score: 0-No pain Faces Pain Scale: No hurt    Home Living Family/patient expects to be discharged to:: (P) Private residence Living Arrangements: (P) Alone Available Help at Discharge: (P) Family;Available PRN/intermittently Type of Home: (P) Apartment Home Access: (P) Elevator       Home Layout: (P) One level Home Equipment: (P) Toilet riser;Cane - single point Additional Comments: (P) daughter's family lives nearby, lives in senior apartment    Prior Function            PT Goals (current goals can now be  found in the care plan section) Acute Rehab PT Goals Patient Stated Goal: return home PT Goal Formulation: With patient Time For Goal Achievement: 01/28/23 Potential to Achieve Goals: Good Progress towards PT goals: Progressing toward goals    Frequency    Min 1X/week      PT Plan Discharge plan needs to be updated    Co-evaluation              AM-PAC PT "6 Clicks" Mobility   Outcome Measure  Help needed turning from your back to your side while in a flat bed without using bedrails?: Total Help needed moving from lying on your back to sitting on the side of a flat bed without using bedrails?: Total Help needed moving to and from a bed to a chair (including a wheelchair)?: Total Help needed standing up from a chair using your arms (e.g., wheelchair or bedside chair)?: Total Help needed to walk in hospital room?: Total Help needed climbing 3-5 steps with a railing? : Total 6 Click Score: 6    End of Session   Activity Tolerance: Patient limited by fatigue Patient left: in bed;with call bell/phone within reach;with family/visitor present Nurse Communication: Mobility status PT Visit Diagnosis: Other abnormalities of gait and mobility (R26.89);Muscle weakness (generalized) (M62.81)     Time: 1610-9604 PT Time Calculation (min) (ACUTE ONLY): 14 min  Charges:  $Therapeutic Activity: 8-22 mins                     Ralene Bathe Kistler PT 01/28/2023  Acute Rehabilitation Services  Office [phone]

## 2023-01-28 NOTE — Progress Notes (Signed)
   01/28/23 0936  Adult Ventilator Settings  Vent Mode (S)  PSV;CPAP  FiO2 (%) (S)  45 %  Pressure Support (S)  12 cmH20  PEEP (S)  5 cmH20  Adult Ventilator Measurements  Peak Airway Pressure 18 L/min  Mean Airway Pressure 11 cmH20  Resp Rate Spontaneous 19 br/min  Spont TV 487 mL  Measured Ve 8.7 L  Total PEEP 5 cmH20  SpO2 100 %  Adult Ventilator Alarms  Alarms On Y

## 2023-01-28 NOTE — Progress Notes (Signed)
SLP Cancellation Note  Patient Details Name: [name] MRN: [id number] DOB: [birth year]   Cancelled treatment:       Reason Eval/Treat Not Completed: Patient not medically ready;Other (comment) (put back on full vent support. SLP will follow for patient's ability to wean from vent)   Angela Nevin, MA, CCC-SLP Speech Therapy

## 2023-01-28 NOTE — Progress Notes (Signed)
   01/28/23 0800  Adult Ventilator Settings  Vent Mode (S)  PRVC (Changed back to full support, pt nodding off, not awakening well with encouragement, low VE alarms, apneic, will try weaning trials later in the am.)  Vt Set 510 mL  Set Rate 14 bmp  FiO2 (%) 45 %  PEEP 5 cmH20  Adult Ventilator Measurements  SpO2 95 %  Termination of Wake Up Assessment (Document within 2 hours of WUA Start)  Follows commands Yes  Synchrony No  WUA Patient Response/RASS Score 3  Sedation outcome Resumed medications at 50%  Vent Respiratory Assessment  Level of Consciousness Alert  Respiratory Pattern Regular;Unlabored;Symmetrical

## 2023-01-28 NOTE — Progress Notes (Signed)
[facility] ADULT ICU REPLACEMENT PROTOCOL   The patient does apply for the [facility] Adult ICU Electrolyte Replacment Protocol based on the criteria listed below:   1.Exclusion criteria: TCTS, ECMO, Dialysis, and Myasthenia Gravis patients 2. Is GFR >/= 30 ml/min? Yes.    Patient's GFR today is >60 3. Is SCr </= 2? Yes.   Patient's SCr is 0.95 mg/dL 4. Did SCr increase >/= 0.5 in 24 hours? No. 5.Pt's weight >40kg  Yes.   6. Abnormal electrolyte(s): potassium 3.7  7. Electrolytes replaced per protocol 8.  Call MD STAT for K+ </= 2.5, Phos </= 1, or Mag </= 1 Physician:  protocol  Melvern Banker 01/28/2023 3:37 AM

## 2023-01-28 NOTE — Progress Notes (Signed)
   01/28/23 0957  Adult Ventilator Settings  Vent Mode (S)  PRVC (Sp02 decreased in the 60s per RN, pt placed back on full support, increase in secretions noted, frequent suctioning, hold weaning for now.)

## 2023-01-28 NOTE — Progress Notes (Addendum)
ANTICOAGULATION CONSULT NOTE - Follow Up Consult  Pharmacy Consult for Heparin IV Indication: atrial fibrillation  Allergies  Allergen Reactions   Codeine Nausea And Vomiting and Other (See Comments)    Hallucinations   Prednisone Other (See Comments)    'Made me feel funny inside my head"-pt cannot be specific    Patient Measurements: Height: 5\' 8"  (172.7 cm) Weight: 82.8 kg (182 lb 8.7 oz) IBW/kg (Calculated) : 63.9 Heparin Dosing Weight: 81mg   Vital Signs: Temp: 97.7 F (36.5 C) (05/18 0000) Temp Source: Axillary (05/18 0000) BP: 131/61 (05/18 0200) Pulse Rate: 60 (05/18 0200)  Labs: Recent Labs    01/26/23 0415 01/26/23 1148 01/26/23 1812 01/27/23 0503 01/28/23 0300  HGB 8.3*  --   --  8.4* 8.8*  HCT 27.5*  --   --  27.3* 28.6*  PLT 359  --   --  367 413*  HEPARINUNFRC 0.28*   < > 0.48 0.61 0.56  CREATININE 0.96  --   --  0.89 0.95   < > = values in this interval not displayed.     Estimated Creatinine Clearance: 64.9 mL/min (by C-G formula based on SCr of 0.95 mg/dL).   Medications:  Infusions:   sodium chloride 10 mL/hr at 01/28/23 0200   dexmedetomidine (PRECEDEX) IV infusion 0.9 mcg/kg/hr (01/28/23 0200)   feeding supplement (OSMOLITE 1.5 CAL) 20 mL/hr at 01/28/23 0200   fentaNYL infusion INTRAVENOUS 175 mcg/hr (01/28/23 0200)   heparin 1,350 Units/hr (01/28/23 0200)   potassium chloride      Assessment: 50 yoF with hx bladder/lung cancer, COPD who presented to the ED on 01/11/23 with poor oral intake, n/v and SOB. She was subsequently found to have postobstructive PNA and new onset afib with RVR. She was started in Eliquis for afib on 01/12/23, but held after doses on 5/4, transitioned to heparin on 01/16/23 for bronch with biopsies 5/7, heparin resumed post procedure on 01/18/23.  5/17 PM Update:  Heparin held at 8am today for trach planned for 14:00.  OK to resume heparin post-procedure at 8pm per MD/NP.   01/28/2023 HL 0.56 therapeutic on 1350  units/hr Hgb low but stable, plts elevated No bleeding noted per RN  Goal of Therapy:  Heparin level 0.3-0.7 units/ml Monitor platelets by anticoagulation protocol: Yes   Plan:  Continue heparin drip at 1350 units/hr Confirmatory Heparin level in 6 hours Daily heparin level and CBC  F/u long term anticoagulation plans   Arley Phenix RPh 01/28/2023, 3:39 AM

## 2023-01-28 NOTE — Progress Notes (Signed)
NAME:  [name], MRN:  [id number], DOB:  [birth year], LOS: 16 ADMISSION DATE:  01/11/2023, CONSULTATION DATE:  01/16/2023 REFERRING MD: FMTS, CHIEF COMPLAINT: Acute Respiratory Failure in the setting of Lung Cancer, COPD and infiltrate on CXR   History of Present Illness:  67 year old woman who presented to [facility] 5/1 for nausea/vomiting/diarrhea and abdominal pain x 2 weeks. PMHx significant for COPD, lung CA (adenocarcinoma of LLL, biopsy planned for 5/7), bladder CA (s/p TURBT), GERD, and panic/anxiety. Admitted for AHRF in setting of CAP/COPD exacerbation to FMTS at [facility].   Found to have postobstructive PNA (RML/RLL) and R hilar mass with progressive respiratory failure requiring intubation 5/6. Patient also experienced panic attacks/anxiety leading to exacerbated WOB and VCD with associated stridor. Of note, patient is a DNR; however she endorses that she is not ready to die and wanted to be intubated. (See Prior Note documentation 01/16/2023). Transferred to [facility] 5/7 for ongoing oncological care.  Pertinent Medical History:   Past Medical History:  Diagnosis Date   Abnormal CT lung screening 07/10/2020   Anxiety    Anxiety state 02/28/2012   GAD7 : 6  PHQ9 : 6   Arthritis    Bladder cancer (HCC) 09/2020   urologist--- dr Berneice Heinrich, incidental finding on PET scan 12/ 2021,  s/p TURBT 09-18-2020 high grade papillary urothelial    Cancer related pain 03/11/2022   CAP (community acquired pneumonia) 04/20/2022   Centrilobular emphysema (HCC)    Chronic left shoulder pain 04/29/2021   Close exposure to COVID-19 virus 05/03/2019   Constipation 04/09/2019   Costochondritis, acute 08/14/2020   DDD (degenerative disc disease), lumbar    Depression    DOE (dyspnea on exertion)    10-29-2020  per pt can do house work without sob, when walks/ stairs stops frequently  (stated recovers quickly when stops)   Emphysema lung (HCC)    Epidermoid cyst 10/29/2018   Recurrent. Located on left hip at iliac  crest 2" anterior to midaxillary line. Removed 2013, 2020, 2022.    Epigastric pain 11/16/2020   Fibroid 2003   GERD (gastroesophageal reflux disease)    History of radiation therapy    Left lung- 10/06/20-10/16/20- Dr. Antony Blackbird   Leg cramping 11/13/2019   Lumbar spine pain 01/09/2015   Menopausal vaginal dryness 11/13/2019   Multiple closed fractures of ribs of left side 08/20/2022   Nocturnal enuresis 06/23/2020   Chronic overnight "leaking" of unknown source. Likely urine, but reports no color or odor. Volume enough for liners or light pads. Occurs every night.    Primary adenocarcinoma of lower lobe of left lung (HCC) 07/2020   oncologist--- dr Arbutus Ped---  dx 11/ 2021 bilateral lower lobe masses,  s/p bronchoscopy w/ bx's 07-28-2020 malignancy cells on left ;  completed SBRT bilateral lower lung 10-16-2020 5 fractions   Radiation-induced dermatitis    on 10-29-2020 pt complaint stomach and chest areas,  completed SBRT 10-16-2020   Seasonal allergies    Stage 3 severe COPD by GOLD classification [facility])    pulmonologist--- dr Tonia Brooms,  using anoro inhaler daily   Wears glasses    Significant Hospital Events: Including procedures, antibiotic start and stop dates in addition to other pertinent events   01/16/2023 Transferred to ICU for acute Respiratory Failure , intubated  5/7 Bronch by Icard. significant extrinsic compression of the airways in the right lower lobe and middle lobe. This is likely malignant obstruction. Bx sent. Transferred to [facility] for rad/onc eval. PICC placed 5/8 Stable still  high PEEP/FiO2 needs. Rad/onc consult requested  5/10 Tolerating SBT trial this AM on low-dose fentanyl and Precedex.  Propofol DC'd due to high triglycerides 5/11 Tolerates SBT's, pressure support 10/5 5/12 Off of pressors. Remains on vent, sedated with Precedex/Fentanyl. PMT consulted. 5/13 Weaning on PSV 14/8. > 10L positive over the course of admission. Diuresing. 5/14 Weaning on PSV. Ongoing  diuresis. 5/15 Tolerating SBT, PSV 5/5, following commands. Continue diuresis, Foley placed for persistent urinary retention. Extubated, decompensated quickly after requiring reintubation. DHT placed. 5/16 Agitated overnight, c/o throat pain with ETT, RASS 1-2 despite sedation. Failed extubation 5/15. Tolerating SBT PSV 10/5, FiO2 35%. ~1630 desaturated with inability to pass [place] suction/difficulty bagging; urgent tube exchange completed with bougie and blood clot removed with improvement in oxygenation/ventilation. 5/17 Tracheostomy placed  Interim History / Subjective:  Trach placed on 5/17 Heparin drip restarted 0.45, PEEP 5 Fentanyl 175, Precedex 0.8.  Remains on scheduled oxycodone and clonazepam I/O+ 9.3 L total  Objective:  Blood pressure 131/61, pulse (!) 56, temperature 97.8 F (36.6 C), temperature source Axillary, resp. rate 14, height 5\' 8"  (1.727 m), weight 78.3 kg, SpO2 97 %.    Vent Mode: PRVC FiO2 (%):  [45 %] 45 % Set Rate:  [14 bmp] 14 bmp Vt Set:  [510 mL] 510 mL PEEP:  [5 cmH20] 5 cmH20 Plateau Pressure:  [11 cmH20-18 cmH20] 18 cmH20   Intake/Output Summary (Last 24 hours) at 01/28/2023 0703 Last data filed at 01/28/2023 0645 Gross per 24 hour  Intake 1988.5 ml  Output 3375 ml  Net -1386.5 ml   Filed Weights   01/26/23 0401 01/27/23 0500 01/28/23 0422  Weight: 83.4 kg 82.8 kg 78.3 kg   Physical Examination: General: Acute and chronically ill-appearing woman HEENT: Oropharynx clear.  Trach in place with pink secretions and filter Neuro: Awake, sitting up in bed, somewhat uncomfortable.  Follows commands, strong cough.  Moves all extremities CV: Regular, tachycardic, no murmur PULM: Slightly tachypneic, able to take deep breaths.  Scattered rhonchi GI: Nondistended, positive bowel sounds Extremities: No edema Skin: No rash  Resolved Hospital Problem List:  Hypertriglyceridemia due to propofol Hyponatremia Hypotension, Drug induced from sedation    Assessment & Plan:   Acute respiratory failure in setting of COPD, recurrent lung cancer w/ post obstructive atelectasis +/- post obstructive PNA. Intubated 5/7,  Zosyn completed 5/13 R hilar lung mass History of LLL adenocarcinoma Pulmonary edema Small bilateral pleural effusions. stable COPD Failed extubation 5/15; rapidly decompensated after ETT removed with tachypnea to 40s/poor air movement, stridor requiring Decadron, tachycardia to 160s, reintubated same day. Tube exchange completed 5/16 for clot blocking ETT. Vocal cord edema noted on reintubation despite +cuff leak prior to extubation. -Tracheostomy placed on 5/17 -Continue PRVC 8 cc/kg, work towards lightening sedation and SBT -Dexamethasone course completed -Continue bronchodilators, Brovana, Yupelri and budesonide -Diuresis as tolerated, dose again on 5/18 -Sedation as per PAD protocol, wean as able.  May be able to begin decreasing scheduled oxycodone, clonazepam soon -VAP prevention orders -Pulmonary hygiene -Ongoing XRT as per radiation oncology plans  Severe anxiety History of panic attacks -PAD protocol as above, weaning -Scheduled oxycodone, clonazepam.  She wakes easily on this regimen.  May be able to begin weaning soon -Continue home Celexa  Paroxysmal atrial fibrillation  Anticoagulated at baseline with apixaban. -Heparin drip, consider back to apixaban soon -Telemetry monitoring -Optimize electrolyte  At risk for malnutrition  -Will need NG tube for TF  Hyperglycemia -Continue sliding scale insulin as per protocol -Goal CBG  less than 180  Anemia of chronic disease -Following CBC.  No evidence for active bleed at this time -Transfusion goal hemoglobin 7.0  Urinary retention likely 2/2 sedation -Continue bethanechol 10 mg 3 times daily -Consider DC Foley catheter in the next few days for voiding trial  Best Practice: (right click and "Reselect all SmartList Selections" daily)   Diet/type:  tubefeeds DVT prophylaxis: heparin gtt (held for trach) GI prophylaxis: H2B Lines: Central line - PICC Foley:  Yes Code Status:  full code Last date of multidisciplinary goals of care discussion 5/11 discussion with daughters, they have unrealistic goal of taking mom home with a tracheostomy and vent if needed.  Desire full CODE STATUS and reintubation.  Critical care time: 32 minutes   The patient is critically ill with multiple organ system failure and requires high complexity decision making for assessment and support, frequent evaluation and titration of therapies, advanced monitoring, review of radiographic studies and interpretation of complex data.   Critical Care Time devoted to patient care services, exclusive of separately billable procedures, described in this note is 32 minutes.  Levy Pupa, MD, PhD 01/28/2023, 7:12 AM [place] [facility] [phone] or if no answer before 7:00PM call [phone] For any issues after 7:00PM please call eLink [phone]

## 2023-01-28 NOTE — Progress Notes (Addendum)
   01/28/23 0755  Adult Ventilator Settings  Vent Mode (S)  PSV;CPAP  FiO2 (%) (S)  45 %  Pressure Support (S)  20 cmH20  PEEP (S)  5 cmH20  Adult Ventilator Measurements  Peak Airway Pressure 25 L/min  Mean Airway Pressure 9 cmH20  Resp Rate Spontaneous 10 br/min  Resp Rate Total 10 br/min  Spont TV 729 mL  Total PEEP 5 cmH20  SpO2 96 %  Adult Ventilator Alarms  Alarms On Y

## 2023-01-29 DIAGNOSIS — J9601 Acute respiratory failure with hypoxia: Secondary | ICD-10-CM | POA: Diagnosis not present

## 2023-01-29 LAB — COMPREHENSIVE METABOLIC PANEL
ALT: 43 U/L (ref 0–44)
AST: 18 U/L (ref 15–41)
Albumin: 2.5 g/dL — ABNORMAL LOW (ref 3.5–5.0)
Alkaline Phosphatase: 70 U/L (ref 38–126)
Anion gap: 8 (ref 5–15)
BUN: 45 mg/dL — ABNORMAL HIGH (ref 8–23)
CO2: 31 mmol/L (ref 22–32)
Calcium: 8.7 mg/dL — ABNORMAL LOW (ref 8.9–10.3)
Chloride: 99 mmol/L (ref 98–111)
Creatinine, Ser: 1.02 mg/dL — ABNORMAL HIGH (ref 0.44–1.00)
GFR, Estimated: >60 mL/min (ref >60)
Glucose, Bld: 110 mg/dL — ABNORMAL HIGH (ref 70–99)
Potassium: 4.2 mmol/L (ref 3.5–5.1)
Sodium: 138 mmol/L (ref 135–145)
Total Bilirubin: 0.7 mg/dL (ref 0.3–1.2)
Total Protein: 6.5 g/dL (ref 6.5–8.1)

## 2023-01-29 LAB — CBC
HCT: 28.8 % — ABNORMAL LOW (ref 36.0–46.0)
Hemoglobin: 8.9 g/dL — ABNORMAL LOW (ref 12.0–15.0)
MCH: 27.4 pg (ref 26.0–34.0)
MCHC: 30.9 g/dL (ref 30.0–36.0)
MCV: 88.6 fL (ref 80.0–100.0)
Platelets: 393 10*3/uL (ref 150–400)
RBC: 3.25 MIL/uL — ABNORMAL LOW (ref 3.87–5.11)
RDW: 14.9 % (ref 11.5–15.5)
WBC: 10.7 10*3/uL — ABNORMAL HIGH (ref 4.0–10.5)
nRBC: 0 % (ref 0.0–0.2)

## 2023-01-29 LAB — HEPARIN LEVEL (UNFRACTIONATED): Heparin Unfractionated: 0.63 IU/mL (ref 0.30–0.70)

## 2023-01-29 LAB — GLUCOSE, CAPILLARY
Glucose-Capillary: 97 mg/dL (ref 70–99)
Glucose-Capillary: 143 mg/dL — ABNORMAL HIGH (ref 70–99)
Glucose-Capillary: 108 mg/dL — ABNORMAL HIGH (ref 70–99)
Glucose-Capillary: 141 mg/dL — ABNORMAL HIGH (ref 70–99)
Glucose-Capillary: 92 mg/dL (ref 70–99)

## 2023-01-29 LAB — PHOSPHORUS: Phosphorus: 4.2 mg/dL (ref 2.5–4.6)

## 2023-01-29 LAB — CULTURE, BAL-QUANTITATIVE W GRAM STAIN

## 2023-01-29 LAB — MAGNESIUM: Magnesium: 2.2 mg/dL (ref 1.7–2.4)

## 2023-01-29 MED ORDER — OXYCODONE HCL 5 MG PO TABS
5.0000 mg | ORAL_TABLET | Freq: Four times a day (QID) | ORAL | Status: DC
  Administered 2023-01-29 – 2023-02-03 (×20): 5 mg
  Filled 2023-01-29 (×20): qty 1

## 2023-01-29 MED ORDER — NOREPINEPHRINE 4 MG/250ML-% IV SOLN
0.0000 ug/min | INTRAVENOUS | Status: DC
  Filled 2023-01-29: qty 250

## 2023-01-29 MED ORDER — DEXMEDETOMIDINE HCL IN NACL 200 MCG/50ML IV SOLN
0.0000 ug/kg/h | INTRAVENOUS | Status: DC
  Administered 2023-01-29: 0.2 ug/kg/h via INTRAVENOUS
  Administered 2023-01-30: 0.5 ug/kg/h via INTRAVENOUS
  Administered 2023-01-30 (×2): 0.7 ug/kg/h via INTRAVENOUS
  Administered 2023-01-30: 0.6 ug/kg/h via INTRAVENOUS
  Administered 2023-01-31: 1 ug/kg/h via INTRAVENOUS
  Administered 2023-01-31: 0.9 ug/kg/h via INTRAVENOUS
  Administered 2023-01-31: 1 ug/kg/h via INTRAVENOUS
  Administered 2023-01-31: 0.8 ug/kg/h via INTRAVENOUS
  Filled 2023-01-29 (×9): qty 50

## 2023-01-29 NOTE — Progress Notes (Signed)
eLink Physician-Brief Progress Note Patient Name: [name] DOB: 10-20-1955 MRN: [id number]   Date of Service  01/29/2023  HPI/Events of Note  Precedex d/c'd due to hypotension with SBP 70s Fentanyl reduced to 50  eICU Interventions  Monitor BP on reduced sedation     Intervention Category Intermediate Interventions: Hypotension - evaluation and management  [name] 01/29/2023, 3:20 AM

## 2023-01-29 NOTE — Progress Notes (Signed)
ANTICOAGULATION CONSULT NOTE - Follow Up Consult  Pharmacy Consult for Heparin IV Indication: atrial fibrillation  Allergies  Allergen Reactions   Codeine Nausea And Vomiting and Other (See Comments)    Hallucinations   Prednisone Other (See Comments)    'Made me feel funny inside my head"-pt cannot be specific    Patient Measurements: Height: 5\' 8"  (172.7 cm) Weight: 77.6 kg (171 lb 1.2 oz) IBW/kg (Calculated) : 63.9 Heparin Dosing Weight: 81mg   Vital Signs: Temp: 98.8 F (37.1 C) (05/19 0014) Temp Source: Axillary (05/19 0014) BP: 114/54 (05/19 0345) Pulse Rate: 100 (05/19 0345)  Labs: Recent Labs    01/27/23 0503 01/28/23 0300 01/28/23 0923 01/29/23 0321  HGB 8.4* 8.8*  --  8.9*  HCT 27.3* 28.6*  --  28.8*  PLT 367 413*  --  393  HEPARINUNFRC 0.61 0.56 0.59 0.63  CREATININE 0.89 0.95  --  1.02*     Estimated Creatinine Clearance: 58.6 mL/min (A) (by C-G formula based on SCr of 1.02 mg/dL (H)).   Medications:  Infusions:   sodium chloride 10 mL/hr at 01/29/23 0341   dexmedetomidine (PRECEDEX) IV infusion Stopped (01/29/23 0300)   feeding supplement (OSMOLITE 1.5 CAL) 55 mL/hr at 01/29/23 0341   fentaNYL infusion INTRAVENOUS 50 mcg/hr (01/29/23 0341)   heparin 1,350 Units/hr (01/29/23 0341)    Assessment: 42 yoF with hx bladder/lung cancer, COPD who presented to the ED on 01/11/23 with poor oral intake, n/v and SOB. She was subsequently found to have postobstructive PNA and new onset afib with RVR. She was started in Eliquis for afib on 01/12/23, but held after doses on 5/4, transitioned to heparin on 01/16/23 for bronch with biopsies 5/7, heparin resumed post procedure on 01/18/23.  01/29/2023 HL remains therapeutic at 0.63 on 1350 units/hr Hgb low but stable, plts WNL No bleeding noted per RN  Goal of Therapy:  Heparin level 0.3-0.7 units/ml Monitor platelets by anticoagulation protocol: Yes   Plan:  Continue heparin drip at 1350 units/hr Daily heparin  level and CBC  F/u transition back to apixaban  Arley Phenix RPh 01/29/2023, 4:18 AM

## 2023-01-29 NOTE — Progress Notes (Signed)
Pt was transported to her new room 1228 from 1233 without complications.

## 2023-01-29 NOTE — Progress Notes (Signed)
NAME:  [name], MRN:  [id number], DOB:  02/28/1956, LOS: 17 ADMISSION DATE:  01/11/2023, CONSULTATION DATE:  01/16/2023 REFERRING MD: FMTS, CHIEF COMPLAINT: Acute Respiratory Failure in the setting of Lung Cancer, COPD and infiltrate on CXR   History of Present Illness:  67 year old woman who presented to [facility] 5/1 for nausea/vomiting/diarrhea and abdominal pain x 2 weeks. PMHx significant for COPD, lung CA (adenocarcinoma of LLL, biopsy planned for 5/7), bladder CA (s/p TURBT), GERD, and panic/anxiety. Admitted for AHRF in setting of CAP/COPD exacerbation to FMTS at [facility].   Found to have postobstructive PNA (RML/RLL) and R hilar mass with progressive respiratory failure requiring intubation 5/6. Patient also experienced panic attacks/anxiety leading to exacerbated WOB and VCD with associated stridor. Of note, patient is a DNR; however she endorses that she is not ready to die and wanted to be intubated. (See Prior Note documentation 01/16/2023). Transferred to [facility] 5/7 for ongoing oncological care.  Pertinent Medical History:   Past Medical History:  Diagnosis Date   Abnormal CT lung screening 07/10/2020   Anxiety    Anxiety state 02/28/2012   GAD7 : 6  PHQ9 : 6   Arthritis    Bladder cancer (HCC) 09/2020   urologist--- dr Berneice Heinrich, incidental finding on PET scan 12/ 2021,  s/p TURBT 09-18-2020 high grade papillary urothelial    Cancer related pain 03/11/2022   CAP (community acquired pneumonia) 04/20/2022   Centrilobular emphysema (HCC)    Chronic left shoulder pain 04/29/2021   Close exposure to COVID-19 virus 05/03/2019   Constipation 04/09/2019   Costochondritis, acute 08/14/2020   DDD (degenerative disc disease), lumbar    Depression    DOE (dyspnea on exertion)    10-29-2020  per pt can do house work without sob, when walks/ stairs stops frequently  (stated recovers quickly when stops)   Emphysema lung (HCC)    Epidermoid cyst 10/29/2018   Recurrent. Located on left hip at iliac  crest 2" anterior to midaxillary line. Removed 2013, 2020, 2022.    Epigastric pain 11/16/2020   Fibroid 2003   GERD (gastroesophageal reflux disease)    History of radiation therapy    Left lung- 10/06/20-10/16/20- Dr. Antony Blackbird   Leg cramping 11/13/2019   Lumbar spine pain 01/09/2015   Menopausal vaginal dryness 11/13/2019   Multiple closed fractures of ribs of left side 08/20/2022   Nocturnal enuresis 06/23/2020   Chronic overnight "leaking" of unknown source. Likely urine, but reports no color or odor. Volume enough for liners or light pads. Occurs every night.    Primary adenocarcinoma of lower lobe of left lung (HCC) 07/2020   oncologist--- dr Arbutus Ped---  dx 11/ 2021 bilateral lower lobe masses,  s/p bronchoscopy w/ bx's 07-28-2020 malignancy cells on left ;  completed SBRT bilateral lower lung 10-16-2020 5 fractions   Radiation-induced dermatitis    on 10-29-2020 pt complaint stomach and chest areas,  completed SBRT 10-16-2020   Seasonal allergies    Stage 3 severe COPD by GOLD classification [facility])    pulmonologist--- dr Tonia Brooms,  using anoro inhaler daily   Wears glasses    Significant Hospital Events: Including procedures, antibiotic start and stop dates in addition to other pertinent events   01/16/2023 Transferred to ICU for acute Respiratory Failure , intubated  5/7 Bronch by Icard. significant extrinsic compression of the airways in the right lower lobe and middle lobe. This is likely malignant obstruction. Bx sent. Transferred to [facility] for rad/onc eval. PICC placed 5/8 Stable still  high PEEP/FiO2 needs. Rad/onc consult requested  5/10 Tolerating SBT trial this AM on low-dose fentanyl and Precedex.  Propofol DC'd due to high triglycerides 5/11 Tolerates SBT's, pressure support 10/5 5/12 Off of pressors. Remains on vent, sedated with Precedex/Fentanyl. PMT consulted. 5/13 Weaning on PSV 14/8. > 10L positive over the course of admission. Diuresing. 5/14 Weaning on PSV. Ongoing  diuresis. 5/15 Tolerating SBT, PSV 5/5, following commands. Continue diuresis, Foley placed for persistent urinary retention. Extubated, decompensated quickly after requiring reintubation. DHT placed. 5/16 Agitated overnight, c/o throat pain with ETT, RASS 1-2 despite sedation. Failed extubation 5/15. Tolerating SBT PSV 10/5, FiO2 35%. ~1630 desaturated with inability to pass Ballard suction/difficulty bagging; urgent tube exchange completed with bougie and blood clot removed with improvement in oxygenation/ventilation. 5/17 Tracheostomy placed  Interim History / Subjective:  Received Lasix 5/17, 5/18 Transient hypotension evening 5/18.  Precedex weaned to off, fentanyl weaned.  Did not require initiation pressors Back in atrial fibrillation this morning, HR 150s 0.45, PEEP 5 I/O+ 9.2 L total   Objective:  Blood pressure (!) 146/109, pulse (!) 116, temperature 99.1 F (37.3 C), temperature source Axillary, resp. rate 16, height 5\' 8"  (1.727 m), weight 77.6 kg, SpO2 94 %.    Vent Mode: PRVC FiO2 (%):  [40 %-45 %] 45 % Set Rate:  [14 bmp] 14 bmp Vt Set:  [510 mL] 510 mL PEEP:  [5 cmH20] 5 cmH20 Pressure Support:  [12 cmH20-20 cmH20] 12 cmH20 Plateau Pressure:  [13 cmH20-17 cmH20] 13 cmH20   Intake/Output Summary (Last 24 hours) at 01/29/2023 0733 Last data filed at 01/29/2023 1610 Gross per 24 hour  Intake 2419.38 ml  Output 2575 ml  Net -155.62 ml   Filed Weights   01/27/23 0500 01/28/23 0422 01/29/23 0114  Weight: 82.8 kg 78.3 kg 77.6 kg   Physical Examination: General: Chronically ill-appearing woman HEENT: Trach in place, pink thick secretions in her filter Neuro: Awake, sitting up in bed, somewhat uncomfortable, does follow commands, strong cough.  Moves extremities CV: Irregular, tachycardic 150s PULM: Coarse bilateral rhonchi GI: Nondistended with positive bowel sounds Extremities: No edema Skin: No rash  Resolved Hospital Problem List:  Hypertriglyceridemia due to  propofol Hyponatremia Hypotension, Drug induced from sedation   Assessment & Plan:   Acute respiratory failure in setting of COPD, recurrent lung cancer w/ post obstructive atelectasis +/- post obstructive PNA. Intubated 5/7,  Zosyn completed 5/13 R hilar lung mass History of LLL adenocarcinoma Pulmonary edema Small bilateral pleural effusions. stable COPD Failed extubation 5/15; rapidly decompensated after ETT removed with tachypnea to 40s/poor air movement, stridor requiring Decadron, tachycardia to 160s, reintubated same day. Tube exchange completed 5/16 for clot blocking ETT. Vocal cord edema noted on reintubation despite +cuff leak prior to extubation. -Tracheostomy placed 5/17.  Continue PRVC 8 cc/kg, work towards lightening sedation and SBT -Pulmonary hygiene, mobilize -Dexamethasone course completed for upper airway obstruction/edema -bronchodilators Rosalyn Gess, Yupelri, budesonide -Hold off on diuresis 5/19, goal I=O -Decrease scheduled oxycodone 5/19, consider weaning clonazepam soon as well -VAP prevention orders -Ongoing XRT for right hilar mass and airway obstruction as per radiation oncology plans  Severe anxiety History of panic attacks -PAD protocol as above, Precedex was stopped due to labile blood pressure but now with recurrent agitation and hemodynamics improved.  Will try restarting to see if she tolerates. -Decrease scheduled oxycodone 5/19, consider weaning clonazepam soon.  She does wake easily on this regimen -Continue home Celexa  Paroxysmal atrial fibrillation  Anticoagulated at baseline with apixaban. -Continue  heparin drip, consider back to apixaban soon -Back in RVR this morning 5/19, hopefully will resolve with improved comfort and sedation.  If not then will use metoprolol for rate control -Telemetry monitoring -Optimize electrolytes  At risk for malnutrition  -Tube feeding per NG tube  Hyperglycemia -Sliding scale insulin per protocol -Goal CBG  less than 180  Anemia of chronic disease -Following CBC.  No evidence active bleeding at this time -Transfusion goal hemoglobin 7.0  Urinary retention likely 2/2 sedation -Continue bethanechol 10 mg 3 times daily -Consider DC Foley 5/24 for voiding trial  Best Practice: (right click and "Reselect all SmartList Selections" daily)   Diet/type: tubefeeds DVT prophylaxis: heparin gtt (held for trach) GI prophylaxis: H2B Lines: Central line - PICC Foley:  Yes Code Status:  full code Last date of multidisciplinary goals of care discussion 5/11 discussion with daughters, they have unrealistic goal of taking mom home with a tracheostomy and vent if needed.  Desire full CODE STATUS and reintubation.  Critical care time: 31 minutes   The patient is critically ill with multiple organ system failure and requires high complexity decision making for assessment and support, frequent evaluation and titration of therapies, advanced monitoring, review of radiographic studies and interpretation of complex data.   Critical Care Time devoted to patient care services, exclusive of separately billable procedures, described in this note is  31 minutes.  Levy Pupa, MD, PhD 01/29/2023, 7:33 AM [facility] [phone] or if no answer before 7:00PM call [phone] For any issues after 7:00PM please call eLink [phone]

## 2023-01-30 ENCOUNTER — Other Ambulatory Visit: Payer: Self-pay

## 2023-01-30 ENCOUNTER — Ambulatory Visit: Payer: 59

## 2023-01-30 ENCOUNTER — Ambulatory Visit
Admission: RE | Admit: 2023-01-30 | Discharge: 2023-01-30 | Disposition: A | Discharge status: Home / Self Care | Payer: 59 | Source: Ambulatory Visit | Attending: Radiation Oncology | Admitting: Radiation Oncology

## 2023-01-30 ENCOUNTER — Inpatient Hospital Stay (HOSPITAL_COMMUNITY): Payer: 59

## 2023-01-30 DIAGNOSIS — C3432 Malignant neoplasm of lower lobe, left bronchus or lung: Secondary | ICD-10-CM | POA: Diagnosis not present

## 2023-01-30 DIAGNOSIS — Z930 Tracheostomy status: Secondary | ICD-10-CM

## 2023-01-30 DIAGNOSIS — Z87891 Personal history of nicotine dependence: Secondary | ICD-10-CM | POA: Diagnosis not present

## 2023-01-30 DIAGNOSIS — J9601 Acute respiratory failure with hypoxia: Secondary | ICD-10-CM | POA: Diagnosis not present

## 2023-01-30 DIAGNOSIS — Z510 Encounter for antineoplastic radiation therapy: Secondary | ICD-10-CM | POA: Diagnosis not present

## 2023-01-30 LAB — CBC
HCT: 25.8 % — ABNORMAL LOW (ref 36.0–46.0)
Hemoglobin: 7.9 g/dL — ABNORMAL LOW (ref 12.0–15.0)
MCH: 27.7 pg (ref 26.0–34.0)
MCHC: 30.6 g/dL (ref 30.0–36.0)
MCV: 90.5 fL (ref 80.0–100.0)
Platelets: 347 10*3/uL (ref 150–400)
RBC: 2.85 MIL/uL — ABNORMAL LOW (ref 3.87–5.11)
RDW: 15.2 % (ref 11.5–15.5)
WBC: 8.4 10*3/uL (ref 4.0–10.5)
nRBC: 0 % (ref 0.0–0.2)

## 2023-01-30 LAB — RAD ONC ARIA SESSION SUMMARY
Course Elapsed Days: 12
Plan Fractions Treated to Date: 6
Plan Prescribed Dose Per Fraction: 2 Gy
Plan Total Fractions Prescribed: 27
Plan Total Prescribed Dose: 54 Gy
Reference Point Dosage Given to Date: 12 Gy
Reference Point Session Dosage Given: 2 Gy
Session Number: 9

## 2023-01-30 LAB — GLUCOSE, CAPILLARY
Glucose-Capillary: 124 mg/dL — ABNORMAL HIGH (ref 70–99)
Glucose-Capillary: 122 mg/dL — ABNORMAL HIGH (ref 70–99)
Glucose-Capillary: 126 mg/dL — ABNORMAL HIGH (ref 70–99)
Glucose-Capillary: 130 mg/dL — ABNORMAL HIGH (ref 70–99)
Glucose-Capillary: 105 mg/dL — ABNORMAL HIGH (ref 70–99)

## 2023-01-30 LAB — PHOSPHORUS: Phosphorus: 3.8 mg/dL (ref 2.5–4.6)

## 2023-01-30 LAB — BASIC METABOLIC PANEL
Anion gap: 10 (ref 5–15)
BUN: 34 mg/dL — ABNORMAL HIGH (ref 8–23)
CO2: 29 mmol/L (ref 22–32)
Calcium: 8.2 mg/dL — ABNORMAL LOW (ref 8.9–10.3)
Chloride: 99 mmol/L (ref 98–111)
Creatinine, Ser: 0.82 mg/dL (ref 0.44–1.00)
GFR, Estimated: >60 mL/min (ref >60)
Glucose, Bld: 132 mg/dL — ABNORMAL HIGH (ref 70–99)
Potassium: 4.2 mmol/L (ref 3.5–5.1)
Sodium: 138 mmol/L (ref 135–145)

## 2023-01-30 LAB — MAGNESIUM: Magnesium: 2.1 mg/dL (ref 1.7–2.4)

## 2023-01-30 LAB — HEPARIN LEVEL (UNFRACTIONATED): Heparin Unfractionated: 0.58 IU/mL (ref 0.30–0.70)

## 2023-01-30 MED ORDER — CLONAZEPAM 0.5 MG PO TBDP
1.0000 mg | ORAL_TABLET | Freq: Two times a day (BID) | ORAL | Status: DC
  Administered 2023-01-30 – 2023-01-31 (×3): 1 mg
  Filled 2023-01-30 (×3): qty 2

## 2023-01-30 MED ORDER — APIXABAN 5 MG PO TABS
5.0000 mg | ORAL_TABLET | Freq: Two times a day (BID) | ORAL | Status: DC
  Administered 2023-01-30 – 2023-02-15 (×34): 5 mg
  Filled 2023-01-30 (×34): qty 1

## 2023-01-30 NOTE — Progress Notes (Addendum)
ANTICOAGULATION CONSULT NOTE - Follow Up Consult  Pharmacy Consult for Heparin>>Eliquis Indication: atrial fibrillation  Allergies  Allergen Reactions   Codeine Nausea And Vomiting and Other (See Comments)    Hallucinations   Prednisone Other (See Comments)    'Made me feel funny inside my head"-pt cannot be specific    Patient Measurements: Height: 5\' 8"  (172.7 cm) Weight: 79.4 kg (175 lb 0.7 oz) IBW/kg (Calculated) : 63.9 Heparin Dosing Weight:  79.4 kg  Vital Signs: Temp: 99 F (37.2 C) (05/20 0412) Temp Source: Axillary (05/20 0412) BP: 123/53 (05/20 0600) Pulse Rate: 78 (05/20 0600)  Labs: Recent Labs    01/28/23 0300 01/28/23 0923 01/29/23 0321 01/30/23 0440  HGB 8.8*  --  8.9* 7.9*  HCT 28.6*  --  28.8* 25.8*  PLT 413*  --  393 347  HEPARINUNFRC 0.56 0.59 0.63 0.58  CREATININE 0.95  --  1.02* 0.82    Estimated Creatinine Clearance: 73.7 mL/min (by C-G formula based on SCr of 0.82 mg/dL).  Assessment:  AC/Heme: Eliquis started for new Afib (LD 5/4 ) >> UFH bridging for bronch/trach 5/17. - counseling done 5/3; $0 copay - Hep level 0.58, Hgb 8.9>7.9 overnight. Plts 393>347  Goal of Therapy:  Heparin level 0.3-0.7 units/ml Monitor platelets by anticoagulation protocol: Yes   Plan:  IV heparin 1350 units/hr--d/c Daily HL and CBC Monitor drop in Hgb  Discussed with Dr. Vassie Loll who wants to resume Eliquis per tube today 5/20.   Jibril Mcminn S. Merilynn Finland, PharmD, BCPS Clinical Staff Pharmacist [email or web address] Merilynn Finland, Mahonri Seiden Stillinger 01/30/2023,7:26 AM

## 2023-01-30 NOTE — Evaluation (Signed)
Occupational Therapy Evaluation Patient Details Name: [name] MRN: [id number] DOB: 1955-11-09 Today's Date: 01/30/2023   History of Present Illness Pt is a 67 y.o. female admitted 01/11/23 with acute hypoxic respiratory failure secondary to post-obstructive RLL PNA. Course complicated by anxiety. Intubated 5/6. Plan for biopsy 5/7. Tracheostomy placed 01/27/23.  PMH includes R hilar mass (concern for recurrent NSCLC), COPD, afib, panic attacks/anxiety. (Simultaneous filing. User may not have seen previous data.)   Clinical Impression   Eval limited due to fatigue and lethargy. Pt presents with decline in function and safety with ADLs and ADL mobility with impaired strength, balance, endurance. Pt on vent. Pt initially awake with eyes opened and following commands, then became sleepy, eyes closed and unable to stimulate pt to respond or follow simple commands. Pt required handover hand total A to wash her face. Per most recent previous chart PTA pt lived alone, was Ind with ADLs/selfccare, home mgt (impaired by DOE at times), did not drive and used a SPC for mobility. Pt would benefit from acute OT services to address impairments to maximize level of function and safety     Recommendations for follow up therapy are one component of a multi-disciplinary discharge planning process, led by the attending physician.  Recommendations may be updated based on patient status, additional functional criteria and insurance authorization.   Assistance Recommended at Discharge Frequent or constant Supervision/Assistance  Patient can return home with the following Two people to help with bathing/dressing/bathroom;Two people to help with walking and/or transfers;Assistance with cooking/housework;Assist for transportation;Help with stairs or ramp for entrance;Direct supervision/assist for financial management    Functional Status Assessment  Patient has had a recent decline in their functional status and  demonstrates the ability to make significant improvements in function in a reasonable and predictable amount of time.  Equipment Recommendations  Tub/shower seat    Recommendations for Other Services       Precautions / Restrictions Precautions Precautions: Fall Precaution Comments: watch Spo2 (wears 2L O2 baseline), trach, increased RR when awake (Simultaneous filing. User may not have seen previous data.) Restrictions Weight Bearing Restrictions: No      Mobility Bed Mobility               General bed mobility comments: unable to attempt chair position in bed at this time    Transfers                   General transfer comment: unable to attempt at this time      Balance                                           ADL either performed or assessed with clinical judgement   ADL Overall ADL's : Needs assistance/impaired Eating/Feeding: NPO   Grooming: Wash/dry face;Total assistance Grooming Details (indicate cue type and reason): hand over hand Upper Body Bathing: Total assistance   Lower Body Bathing: Total assistance   Upper Body Dressing : Total assistance   Lower Body Dressing: Total assistance       Toileting- Clothing Manipulation and Hygiene: Total assistance;Bed level         General ADL Comments: pt in and out of sleep, is able to move UE A/AAROM WFLs, currenlty would required total A for all ADLs/selfcare tasks     Vision Baseline Vision/History: 1 Wears glasses Additional Comments: unable to properly  assess due to lethargy     Perception     Praxis      Pertinent Vitals/Pain Pain Assessment Pain Assessment: No/denies pain Pain Score: 0-No pain Pain Intervention(s): Monitored during session     Hand Dominance Left   Extremity/Trunk Assessment Upper Extremity Assessment Upper Extremity Assessment: Generalized weakness   Lower Extremity Assessment Lower Extremity Assessment: Defer to PT evaluation        Communication Communication Communication: No difficulties   Cognition                                             General Comments       Exercises     Shoulder Instructions      Home Living Family/patient expects to be discharged to:: Private residence Living Arrangements: Alone Available Help at Discharge: Family;Available PRN/intermittently Type of Home: Apartment Home Access: Elevator     Home Layout: One level     Bathroom Shower/Tub: Chief Strategy Officer: Handicapped height     Home Equipment: Surveyor, minerals - single point   Additional Comments: daughter's family lives nearby      Prior Functioning/Environment Prior Level of Function : Independent/Modified Independent             Mobility Comments: mod indep with intermittent use of SPC. pt has her license but daughter drives. wears 2L O2 baseline ADLs Comments: mod indep managing household, though sometimes limited by DOE/fatigue        OT Problem List: Decreased strength;Decreased range of motion;Decreased activity tolerance;Cardiopulmonary status limiting activity      OT Treatment/Interventions: Self-care/ADL training;Therapeutic exercise;Energy conservation;DME and/or AE instruction;Therapeutic activities;Patient/family education;Balance training    OT Goals(Current goals can be found in the care plan section) Acute Rehab OT Goals Patient Stated Goal: none stated OT Goal Formulation: Patient unable to participate in goal setting Time For Goal Achievement: 02/13/23 Potential to Achieve Goals: Good ADL Goals Pt Will Perform Grooming: with max assist;sitting (chair position in bed) Additional ADL Goal #1: Pt will tolerate A/AROM of B UEs ti decrease risk of skin breakdown and joint contractures Additional ADL Goal #2: Pt will tolerate light AROM and strengthening exercises with theraband  OT Frequency: Min 1X/week    Co-evaluation               AM-PAC OT "6 Clicks" Daily Activity     Outcome Measure Help from another person eating meals?: Total Help from another person taking care of personal grooming?: Total Help from another person toileting, which includes using toliet, bedpan, or urinal?: Total Help from another person bathing (including washing, rinsing, drying)?: Total Help from another person to put on and taking off regular upper body clothing?: Total Help from another person to put on and taking off regular lower body clothing?: Total 6 Click Score: 6   End of Session    Activity Tolerance: Patient limited by lethargy Patient left: in bed;with call bell/phone within reach;with bed alarm set  OT Visit Diagnosis: Other abnormalities of gait and mobility (R26.89);Muscle weakness (generalized) (M62.81)                Time: 4098-1191 OT Time Calculation (min): 10 min Charges:  OT General Charges $OT Visit: 1 Visit OT Evaluation $OT Eval Moderate Complexity: 1 Mod    Galen Manila 01/30/2023, 2:25 PM

## 2023-01-30 NOTE — Progress Notes (Signed)
Pt transported for Radiation treatment.  No issues noted, Pt back in room on previous wean settings.  Vital signs stable.

## 2023-01-30 NOTE — Progress Notes (Signed)
Pt placed on ATC at 50% FIO2.  Pt did not tolerate well.  Pt became very tachypneic, tachycardic, O2 sats dropped to 87% with increased FIO2 and flow.  Pt placed back on the vent on CPAP/PS with 100% FIO2.  Pt settled back down on CPAP/PS and FIO2 back on 40%.  HR 92, RR 22, Sats 95%.

## 2023-01-30 NOTE — Progress Notes (Signed)
Physical Therapy Treatment Patient Details Name: [name] MRN: [id number] DOB: Jun 02, 1956 Today's Date: 01/30/2023   History of Present Illness Pt is a 67 y.o. female admitted 01/11/23 with acute hypoxic respiratory failure secondary to post-obstructive RLL PNA. Course complicated by anxiety. Intubated 5/6. Plan for biopsy 5/7. Tracheostomy placed 01/27/23.  PMH includes R hilar mass (concern for recurrent NSCLC), COPD, afib, panic attacks/anxiety. (Simultaneous filing. User may not have seen previous data.)    PT Comments    Patient was awake and communicating by  mouthing word and gestures foe about 5-6 minutes, then patient  became somnolent and  did not arouse again with any stimulation. While awake, patient  moved legs in bed, noted decreased full PROM of both  ankles, lacking dorsiflexion to neutral.   While awake , RR up to 41, RN aware. RR 23 while somnolent. Continue PT for  progressive mobility as tolerated.  Recommendations for follow up therapy are one component of a multi-disciplinary discharge planning process, led by the attending physician.  Recommendations may be updated based on patient status, additional functional criteria and insurance authorization.  Follow Up Recommendations  Can patient physically be transported by private vehicle: No    Assistance Recommended at Discharge Frequent or constant Supervision/Assistance  Patient can return home with the following Assist for transportation;Help with stairs or ramp for entrance;A lot of help with walking and/or transfers;A lot of help with bathing/dressing/bathroom;Assistance with cooking/housework   Equipment Recommendations  None recommended by PT    Recommendations for Other Services       Precautions / Restrictions Precautions Precautions: Fall Precaution Comments: watch Spo2 (wears 2L O2 baseline), trach, increased RR when awake  Restrictions Weight Bearing Restrictions: No     Mobility  Bed Mobility                General bed mobility comments: unable to attempt chair position in bed at this time    Transfers                        Ambulation/Gait                   Stairs             Wheelchair Mobility    Modified Rankin (Stroke Patients Only)       Balance                                            Cognition Arousal/Alertness: Awake/alert, Lethargic (Behavior During Therapy: Anxious                                   General Comments: Patietn on vent/trach, initially awake, indicated that she was cold, able to follow 1 step inconsistently, after moving LEs and UEs, patient just shut down and was not responsive. When awake,  RR alarming  with 40 rate. After going to sleep, RR 23. did not  arouse again.        Exercises General Exercises - Lower Extremity Ankle Circles/Pumps: AROM, Both, 5 reps, Supine Short Arc Quad: AROM, Both, 5 reps, Supine Heel Slides: AROM, Both, 5 reps, Supine    General Comments        Pertinent Vitals/Pain Pain Assessment Pain Assessment: PAINAD Breathing: occasional labored  breathing, short period of hyperventilation Negative Vocalization: none Facial Expression: sad, frightened, frown Body Language: tense, distressed pacing, fidgeting Consolability: no need to console PAINAD Score: 3    Home Living Family/patient expects to be discharged to:: Private residence Living Arrangements: Alone Available Help at Discharge: Family;Available PRN/intermittently Type of Home: Apartment Home Access: Elevator       Home Layout: One level Home Equipment: Toilet riser;Cane - single point Additional Comments: daughter's family lives nearby    Prior Function            PT Goals (current goals can now be found in the care plan section) Acute Rehab PT Goals Time For Goal Achievement: 02/11/23 Potential to Achieve Goals: Good Progress towards PT goals: Progressing toward  goals    Frequency    Min 1X/week      PT Plan Discharge plan needs to be updated;    Co-evaluation              AM-PAC PT "6 Clicks" Mobility   Outcome Measure  Help needed turning from your back to your side while in a flat bed without using bedrails?: Total Help needed moving from lying on your back to sitting on the side of a flat bed without using bedrails?: Total Help needed moving to and from a bed to a chair (including a wheelchair)?: Total Help needed standing up from a chair using your arms (e.g., wheelchair or bedside chair)?: Total Help needed to walk in hospital room?: Total Help needed climbing 3-5 steps with a railing? : Total 6 Click Score: 6    End of Session Equipment Utilized During Treatment:  (vent)   Patient left: in bed;with call bell/phone within reach Nurse Communication: Mobility status PT Visit Diagnosis: Other abnormalities of gait and mobility (R26.89);Muscle weakness (generalized) (M62.81)     Time: 1610-9604 PT Time Calculation (min) (ACUTE ONLY): 12 min  Charges:  $Therapeutic Exercise: 8-22 mins                     Blanchard Kelch PT Acute Rehabilitation Services Office [phone] Weekend [phone]    Rada Hay 01/30/2023, 2:10 PM

## 2023-01-30 NOTE — Progress Notes (Signed)
SLP Cancellation Note  Patient Details Name: AMIR WITZ MRN: [id number] DOB: 1955-10-15   Cancelled treatment:       Reason Eval/Treat Not Completed: Medical issues which prohibited therapy. Patient remains on vent but per MD note, plan is for attempting vent weaning this week. SLP will follow for readiness.   Angela Nevin, MA, CCC-SLP Speech Therapy

## 2023-01-30 NOTE — Progress Notes (Signed)
RT called by RN to assess the pt due to her RR being in the mid 40s.  PT was suctioned by RN and placed back on full vent support.  Vt 510,R 14, 40% and 5 peep.  Pt tolerating well. Sats 96%, HR 103, BP 134/47.

## 2023-01-30 NOTE — Progress Notes (Signed)
NAME:  [name], MRN:  [id number], DOB:  Jan 03, 1956, LOS: 18 ADMISSION DATE:  01/11/2023, CONSULTATION DATE:  01/16/2023 REFERRING MD: FMTS, CHIEF COMPLAINT: Acute Respiratory Failure in the setting of Lung Cancer, COPD and infiltrate on CXR   History of Present Illness:  67 year old woman who presented to [facility] 5/1 for nausea/vomiting/diarrhea and abdominal pain x 2 weeks. PMHx significant for COPD, lung CA (adenocarcinoma of LLL, biopsy planned for 5/7), bladder CA (s/p TURBT), GERD, and panic/anxiety. Admitted for AHRF in setting of CAP/COPD exacerbation to FMTS at [facility].   Found to have postobstructive PNA (RML/RLL) and R hilar mass with progressive respiratory failure requiring intubation 5/6. Patient also experienced panic attacks/anxiety leading to exacerbated WOB and VCD with associated stridor. Of note, patient is a DNR; however she endorses that she is not ready to die and wanted to be intubated. (See Prior Note documentation 01/16/2023). Transferred to [facility] 5/7 for ongoing oncological care.  Pertinent Medical History:   Past Medical History:  Diagnosis Date   Abnormal CT lung screening 07/10/2020   Anxiety    Anxiety state 02/28/2012   GAD7 : 6  PHQ9 : 6   Arthritis    Bladder cancer (HCC) 09/2020   urologist--- dr Berneice Heinrich, incidental finding on PET scan 12/ 2021,  s/p TURBT 09-18-2020 high grade papillary urothelial    Cancer related pain 03/11/2022   CAP (community acquired pneumonia) 04/20/2022   Centrilobular emphysema (HCC)    Chronic left shoulder pain 04/29/2021   Close exposure to COVID-19 virus 05/03/2019   Constipation 04/09/2019   Costochondritis, acute 08/14/2020   DDD (degenerative disc disease), lumbar    Depression    DOE (dyspnea on exertion)    10-29-2020  per pt can do house work without sob, when walks/ stairs stops frequently  (stated recovers quickly when stops)   Emphysema lung (HCC)    Epidermoid cyst 10/29/2018   Recurrent. Located on left hip at iliac  crest 2" anterior to midaxillary line. Removed 2013, 2020, 2022.    Epigastric pain 11/16/2020   Fibroid 2003   GERD (gastroesophageal reflux disease)    History of radiation therapy    Left lung- 10/06/20-10/16/20- Dr. Antony Blackbird   Leg cramping 11/13/2019   Lumbar spine pain 01/09/2015   Menopausal vaginal dryness 11/13/2019   Multiple closed fractures of ribs of left side 08/20/2022   Nocturnal enuresis 06/23/2020   Chronic overnight "leaking" of unknown source. Likely urine, but reports no color or odor. Volume enough for liners or light pads. Occurs every night.    Primary adenocarcinoma of lower lobe of left lung (HCC) 07/2020   oncologist--- dr Arbutus Ped---  dx 11/ 2021 bilateral lower lobe masses,  s/p bronchoscopy w/ bx's 07-28-2020 malignancy cells on left ;  completed SBRT bilateral lower lung 10-16-2020 5 fractions   Radiation-induced dermatitis    on 10-29-2020 pt complaint stomach and chest areas,  completed SBRT 10-16-2020   Seasonal allergies    Stage 3 severe COPD by GOLD classification [facility])    pulmonologist--- dr Tonia Brooms,  using anoro inhaler daily   Wears glasses    Significant Hospital Events: Including procedures, antibiotic start and stop dates in addition to other pertinent events   01/16/2023 Transferred to ICU for acute Respiratory Failure , intubated  5/7 Bronch by Icard. significant extrinsic compression of the airways in the right lower lobe and middle lobe. This is likely malignant obstruction. Bx sent. Transferred to [facility] for rad/onc eval. PICC placed 5/8 Stable still  high PEEP/FiO2 needs. Rad/onc consult requested  5/10 Tolerating SBT trial this AM on low-dose fentanyl and Precedex.  Propofol DC'd due to high triglycerides 5/11 Tolerates SBT's, pressure support 10/5 5/12 Off of pressors. Remains on vent, sedated with Precedex/Fentanyl. PMT consulted. 5/13 Weaning on PSV 14/8. > 10L positive over the course of admission. Diuresing. 5/14 Weaning on PSV. Ongoing  diuresis. 5/15 Tolerating SBT, PSV 5/5, following commands. Continue diuresis, Foley placed for persistent urinary retention. Extubated, decompensated quickly after requiring reintubation. DHT placed. 5/16 Agitated overnight, c/o throat pain with ETT, RASS 1-2 despite sedation. Failed extubation 5/15. Tolerating SBT PSV 10/5, FiO2 35%. ~1630 desaturated with inability to pass Ballard suction/difficulty bagging; urgent tube exchange completed with bougie and blood clot removed with improvement in oxygenation/ventilation. 5/17 Tracheostomy placed 5/19 Back in atrial fibrillation this morning, HR 150s  Interim History / Subjective:   Sedated on Precedex and fentanyl Weaned on 10/5 over the weekend Afebrile Heart rate better controlled   Objective:  Blood pressure (!) 123/53, pulse 78, temperature 98.4 F (36.9 C), temperature source Axillary, resp. rate 14, height 5\' 8"  (1.727 m), weight 79.4 kg, SpO2 97 %. CVP:  [8 mmHg-15 mmHg] 15 mmHg  Vent Mode: CPAP;PSV FiO2 (%):  [40 %-50 %] 40 % Set Rate:  [14 bmp] 14 bmp Vt Set:  [510 mL] 510 mL PEEP:  [5 cmH20] 5 cmH20 Pressure Support:  [10 cmH20] 10 cmH20 Plateau Pressure:  [11 cmH20-17 cmH20] 17 cmH20   Intake/Output Summary (Last 24 hours) at 01/30/2023 1610 Last data filed at 01/30/2023 9604 Gross per 24 hour  Intake 2062.36 ml  Output 1090 ml  Net 972.36 ml    Filed Weights   01/28/23 0422 01/29/23 0114 01/30/23 0500  Weight: 78.3 kg 77.6 kg 79.4 kg   Physical Examination: General: Chronically ill-appearing woman HEENT: Trach in place, pink thick secretions in her filter Neuro: RASS -3, does not follow commands CV: Irregular, no murmur PULM: No accessory muscle use decreased breath sounds bilateral GI: Nondistended with positive bowel sounds Extremities: No edema Skin: No rash  Chest x-ray 5/20 unchanged right lower lobe infiltrate/atelectasis Labs show normal electrolytes, stable anemia, no leukocytosis  Resolved Hospital  Problem List:  Hypertriglyceridemia due to propofol Hyponatremia Hypotension, Drug induced from sedation   Assessment & Plan:   Acute respiratory failure in setting of COPD, recurrent lung cancer w/ post obstructive atelectasis +/- post obstructive PNA. Intubated 5/7,   Vocal cord edema noted on reintubation 5/15. Tracheostomy placed 5/17, Zosyn completed 5/13 R hilar lung mass History of LLL adenocarcinoma Pulmonary edema Small bilateral pleural effusions. stable COPD  -Spontaneous breathing trials with goal to wean to trach collar -Dexamethasone course completed for upper airway obstruction/edema -Continue Brovana, Yupelri, budesonide  -VAP prevention orders -Ongoing XRT for right hilar mass and airway obstruction as per radiation oncology plans  Severe anxiety History of panic attacks -PAD protocol as above, Precedex as blood pressure tolerates --Oxycodone 5 every 6, decrease clonazepam to 1 twice daily -Continue home Celexa  Paroxysmal atrial fibrillation  Anticoagulated at baseline with apixaban. -Switch back to apixaban, no more procedures anticipated -if  RVR, can use metoprolol for rate control -Telemetry monitoring -Optimize electrolytes  At risk for malnutrition  -Tube feeding per NG tube, hopeful that we will not need PEG tube here  Hyperglycemia -Sliding scale insulin per protocol -Goal CBG less than 180  Anemia of chronic disease -Following CBC.  No evidence active bleeding at this time -Transfusion goal hemoglobin 7.0  Urinary  retention likely 2/2 sedation -Continue bethanechol 10 mg 3 times daily -Consider DC Foley 5/24 for voiding trial  Best Practice: (right click and "Reselect all SmartList Selections" daily)   Diet/type: tubefeeds DVT prophylaxis: back to apixaban GI prophylaxis: H2B Lines: Central line - PICC Foley:  Yes Code Status:  full code Last date of multidisciplinary goals of care discussion 5/11 discussion with daughters, they have  unrealistic goal of taking mom home with a tracheostomy and vent if needed.  Desire full CODE STATUS and reintubation.  Hoping to liberate her from the vent this week  Critical care time: 33 minutes   The patient is critically ill with multiple organ system failure and requires high complexity decision making for assessment and support, frequent evaluation and titration of therapies, advanced monitoring, review of radiographic studies and interpretation of complex data.   Critical Care Time devoted to patient care services, exclusive of separately billable procedures, described in this note is  33 minutes.   Cyril Mourning MD. Tonny Bollman. [place] [facility] Pager : [phone]  If no response to pager , please call [phone] until 7 pm After 7:00 pm call Elink  [phone]     01/30/2023, 8:42 AM

## 2023-01-31 ENCOUNTER — Ambulatory Visit
Admission: RE | Admit: 2023-01-31 | Discharge: 2023-01-31 | Disposition: A | Discharge status: Home / Self Care | Payer: 59 | Source: Ambulatory Visit | Attending: Radiation Oncology | Admitting: Radiation Oncology

## 2023-01-31 ENCOUNTER — Other Ambulatory Visit: Payer: Self-pay

## 2023-01-31 ENCOUNTER — Ambulatory Visit: Payer: 59

## 2023-01-31 DIAGNOSIS — Z87891 Personal history of nicotine dependence: Secondary | ICD-10-CM | POA: Diagnosis not present

## 2023-01-31 DIAGNOSIS — Z930 Tracheostomy status: Secondary | ICD-10-CM | POA: Diagnosis not present

## 2023-01-31 DIAGNOSIS — C3431 Malignant neoplasm of lower lobe, right bronchus or lung: Secondary | ICD-10-CM | POA: Diagnosis not present

## 2023-01-31 DIAGNOSIS — Z510 Encounter for antineoplastic radiation therapy: Secondary | ICD-10-CM | POA: Diagnosis not present

## 2023-01-31 DIAGNOSIS — J9601 Acute respiratory failure with hypoxia: Secondary | ICD-10-CM | POA: Diagnosis not present

## 2023-01-31 DIAGNOSIS — C3432 Malignant neoplasm of lower lobe, left bronchus or lung: Secondary | ICD-10-CM | POA: Diagnosis not present

## 2023-01-31 LAB — RAD ONC ARIA SESSION SUMMARY
Course Elapsed Days: 13
Plan Fractions Treated to Date: 7
Plan Prescribed Dose Per Fraction: 2 Gy
Plan Total Fractions Prescribed: 27
Plan Total Prescribed Dose: 54 Gy
Reference Point Dosage Given to Date: 14 Gy
Reference Point Session Dosage Given: 2 Gy
Session Number: 10

## 2023-01-31 LAB — CBC WITH DIFFERENTIAL/PLATELET
Abs Immature Granulocytes: 0.06 10*3/uL (ref 0.00–0.07)
Basophils Absolute: 0.1 10*3/uL (ref 0.0–0.1)
Basophils Relative: 1 %
Eosinophils Absolute: 0.6 10*3/uL — ABNORMAL HIGH (ref 0.0–0.5)
Eosinophils Relative: 6 %
HCT: 28.1 % — ABNORMAL LOW (ref 36.0–46.0)
Hemoglobin: 8.6 g/dL — ABNORMAL LOW (ref 12.0–15.0)
Immature Granulocytes: 1 %
Lymphocytes Relative: 8 %
Lymphs Abs: 0.8 10*3/uL (ref 0.7–4.0)
MCH: 27.6 pg (ref 26.0–34.0)
MCHC: 30.6 g/dL (ref 30.0–36.0)
MCV: 90.1 fL (ref 80.0–100.0)
Monocytes Absolute: 0.5 10*3/uL (ref 0.1–1.0)
Monocytes Relative: 5 %
Neutro Abs: 7.8 10*3/uL — ABNORMAL HIGH (ref 1.7–7.7)
Neutrophils Relative %: 79 %
Platelets: 378 10*3/uL (ref 150–400)
RBC: 3.12 MIL/uL — ABNORMAL LOW (ref 3.87–5.11)
RDW: 15.6 % — ABNORMAL HIGH (ref 11.5–15.5)
WBC: 9.7 10*3/uL (ref 4.0–10.5)
nRBC: 0 % (ref 0.0–0.2)

## 2023-01-31 LAB — COMPREHENSIVE METABOLIC PANEL
ALT: 32 U/L (ref 0–44)
AST: 16 U/L (ref 15–41)
Albumin: 2.5 g/dL — ABNORMAL LOW (ref 3.5–5.0)
Alkaline Phosphatase: 72 U/L (ref 38–126)
Anion gap: 6 (ref 5–15)
BUN: 27 mg/dL — ABNORMAL HIGH (ref 8–23)
CO2: 30 mmol/L (ref 22–32)
Calcium: 8.6 mg/dL — ABNORMAL LOW (ref 8.9–10.3)
Chloride: 102 mmol/L (ref 98–111)
Creatinine, Ser: 0.83 mg/dL (ref 0.44–1.00)
GFR, Estimated: >60 mL/min (ref >60)
Glucose, Bld: 130 mg/dL — ABNORMAL HIGH (ref 70–99)
Potassium: 4.7 mmol/L (ref 3.5–5.1)
Sodium: 138 mmol/L (ref 135–145)
Total Bilirubin: 0.9 mg/dL (ref 0.3–1.2)
Total Protein: 6.5 g/dL (ref 6.5–8.1)

## 2023-01-31 LAB — GLUCOSE, CAPILLARY
Glucose-Capillary: 123 mg/dL — ABNORMAL HIGH (ref 70–99)
Glucose-Capillary: 126 mg/dL — ABNORMAL HIGH (ref 70–99)
Glucose-Capillary: 127 mg/dL — ABNORMAL HIGH (ref 70–99)
Glucose-Capillary: 138 mg/dL — ABNORMAL HIGH (ref 70–99)
Glucose-Capillary: 140 mg/dL — ABNORMAL HIGH (ref 70–99)
Glucose-Capillary: 144 mg/dL — ABNORMAL HIGH (ref 70–99)
Glucose-Capillary: 156 mg/dL — ABNORMAL HIGH (ref 70–99)

## 2023-01-31 MED ORDER — ACETAMINOPHEN 160 MG/5ML PO SOLN
650.0000 mg | Freq: Four times a day (QID) | ORAL | Status: DC | PRN
  Administered 2023-02-02 – 2023-02-06 (×3): 650 mg
  Filled 2023-01-31 (×4): qty 20.3

## 2023-01-31 MED ORDER — DEXMEDETOMIDINE HCL IN NACL 400 MCG/100ML IV SOLN
0.0000 ug/kg/h | INTRAVENOUS | Status: DC
  Administered 2023-01-31 – 2023-02-01 (×6): 1 ug/kg/h via INTRAVENOUS
  Administered 2023-02-02: 0.9 ug/kg/h via INTRAVENOUS
  Administered 2023-02-02: 0.8 ug/kg/h via INTRAVENOUS
  Administered 2023-02-02 (×2): 0.9 ug/kg/h via INTRAVENOUS
  Administered 2023-02-03: 0.7 ug/kg/h via INTRAVENOUS
  Administered 2023-02-03: 1.1 ug/kg/h via INTRAVENOUS
  Administered 2023-02-03: 0.7 ug/kg/h via INTRAVENOUS
  Administered 2023-02-03: 0.8 ug/kg/h via INTRAVENOUS
  Administered 2023-02-04 (×2): 0.6 ug/kg/h via INTRAVENOUS
  Administered 2023-02-04 (×2): 0.8 ug/kg/h via INTRAVENOUS
  Administered 2023-02-05: 0.6 ug/kg/h via INTRAVENOUS
  Administered 2023-02-05: 1 ug/kg/h via INTRAVENOUS
  Administered 2023-02-06: 0.6 ug/kg/h via INTRAVENOUS
  Administered 2023-02-06: 0.4 ug/kg/h via INTRAVENOUS
  Administered 2023-02-07: 0.3 ug/kg/h via INTRAVENOUS
  Administered 2023-02-07 – 2023-02-10 (×5): 0.4 ug/kg/h via INTRAVENOUS
  Administered 2023-02-11 (×2): 0.6 ug/kg/h via INTRAVENOUS
  Administered 2023-02-11: 0.4 ug/kg/h via INTRAVENOUS
  Administered 2023-02-12 – 2023-02-13 (×4): 0.6 ug/kg/h via INTRAVENOUS
  Administered 2023-02-13 (×2): 0.7 ug/kg/h via INTRAVENOUS
  Administered 2023-02-14 (×4): 0.8 ug/kg/h via INTRAVENOUS
  Administered 2023-02-15 (×3): 0.6 ug/kg/h via INTRAVENOUS
  Administered 2023-02-16: 0.8 ug/kg/h via INTRAVENOUS
  Administered 2023-02-16: 0.6 ug/kg/h via INTRAVENOUS
  Administered 2023-02-17: 0.5 ug/kg/h via INTRAVENOUS
  Administered 2023-02-17 – 2023-02-22 (×17): 0.8 ug/kg/h via INTRAVENOUS
  Administered 2023-02-22: 0.6 ug/kg/h via INTRAVENOUS
  Administered 2023-02-22: 0.4 ug/kg/h via INTRAVENOUS
  Administered 2023-02-23: 0.6 ug/kg/h via INTRAVENOUS
  Administered 2023-02-23: 0.7 ug/kg/h via INTRAVENOUS
  Administered 2023-02-23: 0.4 ug/kg/h via INTRAVENOUS
  Administered 2023-02-24 – 2023-02-25 (×2): 0.5 ug/kg/h via INTRAVENOUS
  Administered 2023-02-26: 0.4 ug/kg/h via INTRAVENOUS
  Administered 2023-02-26: 0.3 ug/kg/h via INTRAVENOUS
  Administered 2023-02-27: 0.4 ug/kg/h via INTRAVENOUS
  Filled 2023-01-31 (×79): qty 100

## 2023-01-31 MED ORDER — ACETAMINOPHEN 650 MG RE SUPP: 650.0000 mg | Freq: Four times a day (QID) | RECTAL | Status: DC | PRN

## 2023-01-31 MED ORDER — CLONAZEPAM 0.5 MG PO TBDP
1.0000 mg | ORAL_TABLET | Freq: Three times a day (TID) | ORAL | Status: DC
  Administered 2023-01-31 – 2023-02-03 (×11): 1 mg
  Filled 2023-01-31 (×11): qty 2

## 2023-01-31 NOTE — Progress Notes (Signed)
NAME:  [name], MRN:  [id number], DOB:  1956/09/03, LOS: 19 ADMISSION DATE:  01/11/2023, CONSULTATION DATE:  01/16/2023 REFERRING MD: FMTS, CHIEF COMPLAINT: Acute Respiratory Failure in the setting of Lung Cancer, COPD and infiltrate on CXR   History of Present Illness:  67 year old woman who presented to [facility] 5/1 for nausea/vomiting/diarrhea and abdominal pain x 2 weeks. PMHx significant for COPD, lung CA (adenocarcinoma of LLL, biopsy planned for 5/7), bladder CA (s/p TURBT), GERD, and panic/anxiety. Admitted for AHRF in setting of CAP/COPD exacerbation to FMTS at [facility].   Found to have postobstructive PNA (RML/RLL) and R hilar mass with progressive respiratory failure requiring intubation 5/6. Patient also experienced panic attacks/anxiety leading to exacerbated WOB and VCD with associated stridor. Of note, patient is a DNR; however she endorses that she is not ready to die and wanted to be intubated. (See Prior Note documentation 01/16/2023). Transferred to [facility] 5/7 for ongoing oncological care.  Pertinent Medical History:   Past Medical History:  Diagnosis Date   Abnormal CT lung screening 07/10/2020   Anxiety    Anxiety state 02/28/2012   GAD7 : 6  PHQ9 : 6   Arthritis    Bladder cancer (HCC) 09/2020   urologist--- dr Berneice Heinrich, incidental finding on PET scan 12/ 2021,  s/p TURBT 09-18-2020 high grade papillary urothelial    Cancer related pain 03/11/2022   CAP (community acquired pneumonia) 04/20/2022   Centrilobular emphysema (HCC)    Chronic left shoulder pain 04/29/2021   Close exposure to COVID-19 virus 05/03/2019   Constipation 04/09/2019   Costochondritis, acute 08/14/2020   DDD (degenerative disc disease), lumbar    Depression    DOE (dyspnea on exertion)    10-29-2020  per pt can do house work without sob, when walks/ stairs stops frequently  (stated recovers quickly when stops)   Emphysema lung (HCC)    Epidermoid cyst 10/29/2018   Recurrent. Located on left hip at iliac  crest 2" anterior to midaxillary line. Removed 2013, 2020, 2022.    Epigastric pain 11/16/2020   Fibroid 2003   GERD (gastroesophageal reflux disease)    History of radiation therapy    Left lung- 10/06/20-10/16/20- Dr. Antony Blackbird   Leg cramping 11/13/2019   Lumbar spine pain 01/09/2015   Menopausal vaginal dryness 11/13/2019   Multiple closed fractures of ribs of left side 08/20/2022   Nocturnal enuresis 06/23/2020   Chronic overnight "leaking" of unknown source. Likely urine, but reports no color or odor. Volume enough for liners or light pads. Occurs every night.    Primary adenocarcinoma of lower lobe of left lung (HCC) 07/2020   oncologist--- dr Arbutus Ped---  dx 11/ 2021 bilateral lower lobe masses,  s/p bronchoscopy w/ bx's 07-28-2020 malignancy cells on left ;  completed SBRT bilateral lower lung 10-16-2020 5 fractions   Radiation-induced dermatitis    on 10-29-2020 pt complaint stomach and chest areas,  completed SBRT 10-16-2020   Seasonal allergies    Stage 3 severe COPD by GOLD classification [facility])    pulmonologist--- dr Tonia Brooms,  using anoro inhaler daily   Wears glasses    Significant Hospital Events: Including procedures, antibiotic start and stop dates in addition to other pertinent events   01/16/2023 Transferred to ICU for acute Respiratory Failure , intubated  5/7 Bronch by Icard. significant extrinsic compression of the airways in the right lower lobe and middle lobe. This is likely malignant obstruction. Bx sent. Transferred to [facility] for rad/onc eval. PICC placed 5/8 Stable still  high PEEP/FiO2 needs. Rad/onc consult requested  5/10 Tolerating SBT trial this AM on low-dose fentanyl and Precedex.  Propofol DC'd due to high triglycerides 5/11 Tolerates SBT's, pressure support 10/5 5/12 Off of pressors. Remains on vent, sedated with Precedex/Fentanyl. PMT consulted. 5/13 Weaning on PSV 14/8. > 10L positive over the course of admission. Diuresing. 5/14 Weaning on PSV. Ongoing  diuresis. 5/15 Tolerating SBT, PSV 5/5, following commands. Continue diuresis, Foley placed for persistent urinary retention. Extubated, decompensated quickly after requiring reintubation. DHT placed. 5/16 Agitated overnight, c/o throat pain with ETT, RASS 1-2 despite sedation. Failed extubation 5/15. Tolerating SBT PSV 10/5, FiO2 35%. ~1630 desaturated with inability to pass Ballard suction/difficulty bagging; urgent tube exchange completed with bougie and blood clot removed with improvement in oxygenation/ventilation. 5/17 Tracheostomy placed 5/19 Back in atrial fibrillation in am, HR 150s 5/20 weaned on pressure support 10/5  Interim History / Subjective:   Remains chronic critically ill. Weaned on pressure support , did not tolerate trach collar. Underwent RT Very anxious this morning on weaning, desaturating on pressure support 10/5   Objective:  Blood pressure (!) 112/55, pulse 78, temperature 98.6 F (37 C), temperature source Axillary, resp. rate 16, height 5\' 8"  (1.727 m), weight 81.1 kg, SpO2 97 %.    Vent Mode: SIMV/PC/PS FiO2 (%):  [40 %-50 %] 40 % Set Rate:  [10 bmp-14 bmp] 10 bmp Vt Set:  [510 mL] 510 mL PEEP:  [5 cmH20] 5 cmH20 Pressure Support:  [5 cmH20-10 cmH20] 10 cmH20 Plateau Pressure:  [15 cmH20] 15 cmH20   Intake/Output Summary (Last 24 hours) at 01/31/2023 1046 Last data filed at 01/31/2023 0600 Gross per 24 hour  Intake 1992.74 ml  Output 1580 ml  Net 412.74 ml    Filed Weights   01/29/23 0114 01/30/23 0500 01/31/23 0332  Weight: 77.6 kg 79.4 kg 81.1 kg   Physical Examination: General: Chronically ill-appearing woman, anxious affect HEENT: Trach in place, not much secretions Neuro: Awake, interactive, nonfocal CV: Irregular, no murmur PULM: No accessory muscle use decreased breath sounds bilateral GI: Nondistended with positive bowel sounds Extremities: No edema Skin: No rash  Chest x-ray 5/21 unchanged right lower lobe infiltrate, slight  increase in bilateral hilar infiltrates Labs show normal electrolytes, stable anemia, no leukocytosis  Resolved Hospital Problem List:  Hypertriglyceridemia due to propofol Hyponatremia Hypotension, Drug induced from sedation   Assessment & Plan:   Acute respiratory failure in setting of COPD, recurrent lung cancer w/ post obstructive atelectasis +/- post obstructive PNA. Intubated 5/7,   Vocal cord edema noted on reintubation 5/15 --Dexamethasone course completed. Tracheostomy placed 5/17, Zosyn completed 5/13 R hilar lung mass History of LLL adenocarcinoma Pulmonary edema Small bilateral pleural effusions. stable COPD  -Spontaneous breathing trials with goal to wean to trach collar  for upper airway obstruction/edema , severe anxiety impeding -Continue Brovana, Yupelri, budesonide -VAP prevention orders -Ongoing XRT for right hilar mass and airway obstruction as per radiation oncology plans -Expect prolonged weaning here, will plan for LTAC  Severe anxiety History of panic attacks -PAD protocol , Precedex as blood pressure tolerates, trying to wean fentanyl drip --Oxycodone 5 every 6, increased clonzepam to 1 3 times daily -Continue home Celexa, gabapentin and baclofen  Paroxysmal atrial fibrillation  Anticoagulated at baseline with apixaban. -Switch back to apixaban, no more procedures anticipated -if  RVR, can use metoprolol for rate control -Telemetry monitoring   At risk for malnutrition  -Tube feeding per NG tube, hopeful that we will not need PEG tube  here if she weans to trach collar  Hyperglycemia -Sliding scale insulin per protocol -Goal CBG less than 180  Anemia of chronic disease -Following CBC.  No evidence active bleeding at this time -Transfusion goal hemoglobin 7.0  Urinary retention likely 2/2 sedation -Continue bethanechol 10 mg 3 times daily -Consider DC Foley 5/24 for voiding trial  Best Practice: (right click and "Reselect all SmartList  Selections" daily)   Diet/type: tubefeeds DVT prophylaxis: back to apixaban GI prophylaxis: H2B Lines: Central line - PICC Foley:  Yes Code Status:  full code Last date of multidisciplinary goals of care discussion 5/11 discussion with daughters, they have unrealistic goal of taking mom home with a tracheostomy and vent if needed.  Desire full CODE STATUS and reintubation.  Hoping to liberate her from the vent eventually to facilitate home discharge but may need LTAC in the interim  Critical care time: 32 minutes   The patient is critically ill with multiple organ system failure and requires high complexity decision making for assessment and support, frequent evaluation and titration of therapies, advanced monitoring, review of radiographic studies and interpretation of complex data.   Critical Care Time devoted to patient care services, exclusive of separately billable procedures, described in this note is  32 minutes.   Cyril Mourning MD. Tonny Bollman. [place] [facility] Pager : [phone]  If no response to pager , please call [phone] until 7 pm After 7:00 pm call Elink  [phone]     01/31/2023, 10:46 AM

## 2023-02-01 ENCOUNTER — Telehealth: Payer: Self-pay | Admitting: Family Medicine

## 2023-02-01 ENCOUNTER — Encounter (HOSPITAL_COMMUNITY): Payer: Self-pay | Admitting: Internal Medicine

## 2023-02-01 ENCOUNTER — Other Ambulatory Visit: Payer: Self-pay

## 2023-02-01 ENCOUNTER — Ambulatory Visit
Admission: RE | Admit: 2023-02-01 | Discharge: 2023-02-01 | Disposition: A | Discharge status: Home / Self Care | Payer: 59 | Source: Ambulatory Visit | Attending: Radiation Oncology | Admitting: Radiation Oncology

## 2023-02-01 DIAGNOSIS — C3432 Malignant neoplasm of lower lobe, left bronchus or lung: Secondary | ICD-10-CM | POA: Diagnosis not present

## 2023-02-01 DIAGNOSIS — Z87891 Personal history of nicotine dependence: Secondary | ICD-10-CM | POA: Diagnosis not present

## 2023-02-01 DIAGNOSIS — Z510 Encounter for antineoplastic radiation therapy: Secondary | ICD-10-CM | POA: Diagnosis not present

## 2023-02-01 DIAGNOSIS — J9601 Acute respiratory failure with hypoxia: Secondary | ICD-10-CM | POA: Diagnosis not present

## 2023-02-01 DIAGNOSIS — C3431 Malignant neoplasm of lower lobe, right bronchus or lung: Secondary | ICD-10-CM | POA: Diagnosis not present

## 2023-02-01 LAB — CBC WITH DIFFERENTIAL/PLATELET
Abs Immature Granulocytes: 0.04 10*3/uL (ref 0.00–0.07)
Basophils Absolute: 0 10*3/uL (ref 0.0–0.1)
Basophils Relative: 0 %
Eosinophils Absolute: 0.5 10*3/uL (ref 0.0–0.5)
Eosinophils Relative: 5 %
HCT: 27.1 % — ABNORMAL LOW (ref 36.0–46.0)
Hemoglobin: 8.4 g/dL — ABNORMAL LOW (ref 12.0–15.0)
Immature Granulocytes: 0 %
Lymphocytes Relative: 6 %
Lymphs Abs: 0.5 10*3/uL — ABNORMAL LOW (ref 0.7–4.0)
MCH: 27.5 pg (ref 26.0–34.0)
MCHC: 31 g/dL (ref 30.0–36.0)
MCV: 88.6 fL (ref 80.0–100.0)
Monocytes Absolute: 0.5 10*3/uL (ref 0.1–1.0)
Monocytes Relative: 6 %
Neutro Abs: 7.9 10*3/uL — ABNORMAL HIGH (ref 1.7–7.7)
Neutrophils Relative %: 83 %
Platelets: 381 10*3/uL (ref 150–400)
RBC: 3.06 MIL/uL — ABNORMAL LOW (ref 3.87–5.11)
RDW: 15.4 % (ref 11.5–15.5)
WBC: 9.6 10*3/uL (ref 4.0–10.5)
nRBC: 0 % (ref 0.0–0.2)

## 2023-02-01 LAB — GLUCOSE, CAPILLARY
Glucose-Capillary: 105 mg/dL — ABNORMAL HIGH (ref 70–99)
Glucose-Capillary: 123 mg/dL — ABNORMAL HIGH (ref 70–99)
Glucose-Capillary: 123 mg/dL — ABNORMAL HIGH (ref 70–99)
Glucose-Capillary: 132 mg/dL — ABNORMAL HIGH (ref 70–99)
Glucose-Capillary: 136 mg/dL — ABNORMAL HIGH (ref 70–99)
Glucose-Capillary: 143 mg/dL — ABNORMAL HIGH (ref 70–99)
Glucose-Capillary: 144 mg/dL — ABNORMAL HIGH (ref 70–99)

## 2023-02-01 LAB — BASIC METABOLIC PANEL
Anion gap: 5 (ref 5–15)
BUN: 26 mg/dL — ABNORMAL HIGH (ref 8–23)
CO2: 29 mmol/L (ref 22–32)
Calcium: 8.6 mg/dL — ABNORMAL LOW (ref 8.9–10.3)
Chloride: 101 mmol/L (ref 98–111)
Creatinine, Ser: 0.73 mg/dL (ref 0.44–1.00)
GFR, Estimated: >60 mL/min (ref >60)
Glucose, Bld: 114 mg/dL — ABNORMAL HIGH (ref 70–99)
Potassium: 4.9 mmol/L (ref 3.5–5.1)
Sodium: 135 mmol/L (ref 135–145)

## 2023-02-01 LAB — RAD ONC ARIA SESSION SUMMARY
Course Elapsed Days: 14
Plan Fractions Treated to Date: 8
Plan Prescribed Dose Per Fraction: 2 Gy
Plan Total Fractions Prescribed: 27
Plan Total Prescribed Dose: 54 Gy
Reference Point Dosage Given to Date: 16 Gy
Reference Point Session Dosage Given: 2 Gy
Session Number: 11

## 2023-02-01 MED ORDER — BANATROL TF EN LIQD
60.0000 mL | Freq: Two times a day (BID) | ENTERAL | Status: AC
  Administered 2023-02-02 – 2023-02-11 (×20): 60 mL
  Filled 2023-02-01 (×20): qty 60

## 2023-02-01 MED ORDER — FUROSEMIDE 10 MG/ML IJ SOLN
40.0000 mg | Freq: Once | INTRAMUSCULAR | Status: AC
  Administered 2023-02-01: 40 mg via INTRAVENOUS
  Filled 2023-02-01: qty 4

## 2023-02-01 NOTE — Telephone Encounter (Signed)
PCP called daughter [name] to offer support.   Fayette Pho, MD

## 2023-02-01 NOTE — Progress Notes (Signed)
Physical Therapy Treatment Patient Details Name: [name] MRN: [id number] DOB: 1955/12/06 Today's Date: 02/01/2023   History of Present Illness Pt is a 67 y.o. female admitted 01/11/23 with acute hypoxic respiratory failure secondary to post-obstructive RLL PNA. Course complicated by anxiety. Intubated 5/6. Plan for biopsy 5/7. Tracheostomy placed 01/27/23.  PMH includes R hilar mass (concern for recurrent NSCLC), COPD, afib, panic attacks/anxiety.    PT Comments    Pt lethargic today, opens eyes briefly but not able to stay awake. She participated for a BUE/LE exercises in bed for brief periods but required frequent verbal/tactile simulation to stay awake. Pt reported discomfort at trach site, RN present and repositioned trach.     Recommendations for follow up therapy are one component of a multi-disciplinary discharge planning process, led by the attending physician.  Recommendations may be updated based on patient status, additional functional criteria and insurance authorization.  Follow Up Recommendations  Can patient physically be transported by private vehicle: No    Assistance Recommended at Discharge Frequent or constant Supervision/Assistance  Patient can return home with the following Assist for transportation;Help with stairs or ramp for entrance;Assistance with cooking/housework;Two people to help with walking and/or transfers;Two people to help with bathing/dressing/bathroom   Equipment Recommendations  None recommended by PT    Recommendations for Other Services       Precautions / Restrictions Precautions Precautions: Fall Precaution Comments: watch Spo2 (wears 2L O2 baseline), trach, increased RR when awake, trach Restrictions Weight Bearing Restrictions: No     Mobility  Bed Mobility               General bed mobility comments: +2 total to scoot up in bed    Transfers                   General transfer comment: unable to attempt at this  time    Ambulation/Gait                   Stairs             Wheelchair Mobility    Modified Rankin (Stroke Patients Only)       Balance                                            Cognition Arousal/Alertness: Lethargic Behavior During Therapy: WFL for tasks assessed/performed, Flat affect Overall Cognitive Status: Within Functional Limits for tasks assessed                                 General Comments: on vent, not sedated, able to nod head yes/no; intermittently alert, then falling asleep, follows 1 step commands inconsistently 2* lethargy        Exercises General Exercises - Lower Extremity Ankle Circles/Pumps: AAROM, Both, 15 reps, Supine Short Arc Quad: AAROM, Both, 10 reps, Supine Heel Slides: AAROM, Both, 10 reps, Supine Hip ABduction/ADduction: AAROM, Both, 10 reps, Supine Shoulder Exercises Shoulder Flexion: AAROM, Both, 10 reps, Supine    General Comments        Pertinent Vitals/Pain Pain Assessment Faces Pain Scale: Hurts even more Breathing: occasional labored breathing, short period of hyperventilation Negative Vocalization: none Facial Expression: sad, frightened, frown Body Language: tense, distressed pacing, fidgeting Consolability: distracted or reassured by voice/touch PAINAD Score: 4 Facial Expression:  Tense Body Movements: Protection Muscle Tension: Tense, rigid Compliance with ventilator (intubated pts.): Tolerating ventilator or movement Pain Location: trach Pain Descriptors / Indicators: Discomfort Pain Intervention(s): Repositioned, Other (comment), Premedicated before session (RN repositioned trach)    Home Living                          Prior Function            PT Goals (current goals can now be found in the care plan section) Acute Rehab PT Goals PT Goal Formulation: With patient Time For Goal Achievement: 02/11/23 Potential to Achieve Goals: Good Progress  towards PT goals: Not progressing toward goals - comment (lethargy limiting progress)    Frequency    Min 1X/week      PT Plan Current plan remains appropriate    Co-evaluation              AM-PAC PT "6 Clicks" Mobility   Outcome Measure  Help needed turning from your back to your side while in a flat bed without using bedrails?: Total Help needed moving from lying on your back to sitting on the side of a flat bed without using bedrails?: Total Help needed moving to and from a bed to a chair (including a wheelchair)?: Total Help needed standing up from a chair using your arms (e.g., wheelchair or bedside chair)?: Total Help needed to walk in hospital room?: Total Help needed climbing 3-5 steps with a railing? : Total 6 Click Score: 6    End of Session   Activity Tolerance: Patient limited by fatigue Patient left: in bed;with call bell/phone within reach;with nursing/sitter in room Nurse Communication: Mobility status PT Visit Diagnosis: Other abnormalities of gait and mobility (R26.89);Muscle weakness (generalized) (M62.81)     Time: 1610-9604 PT Time Calculation (min) (ACUTE ONLY): 14 min  Charges:  $Therapeutic Exercise: 8-22 mins                     Ralene Bathe Kistler PT 02/01/2023  Acute Rehabilitation Services  Office [phone]

## 2023-02-01 NOTE — Evaluation (Addendum)
SLP Cancellation Note  Patient Details Name: MARJAN FATICA MRN: [id number] DOB: 11-Mar-1956   Cancelled treatment:       Reason Eval/Treat Not Completed: Other (comment) (pt remains on vent with difficulty weaning, will continue efforts) Secure chatted RN to request she contact this SLP if pt able to wean today and appropriate for PMSV.  Thank you.   Rolena Infante, MS [facility] SLP Acute Rehab Services Office [phone]  Chales Abrahams 02/01/2023, 7:37 AM

## 2023-02-01 NOTE — Progress Notes (Signed)
Nutrition Follow-up  DOCUMENTATION CODES:   Severe malnutrition in context of acute illness/injury  INTERVENTION:  - Continue goal TF: Osmolite 1.5 @ 55 ml/hr (1320 ml/day) PROSource TF20 60 ml daily - Provides 2060 kcal, 103 grams of protein, and 1006 ml of H2O.   - Add Banatrol TF BID x5 days to aid in bulking of stools. - If diarrhea persists at end of 5 days, extend another 5 days.  - FWF per CCM/MD.   - MVI with minerals daily per tube.   - Monitor weight trends.   NUTRITION DIAGNOSIS:   Severe Malnutrition related to acute illness as evidenced by moderate fat depletion, moderate muscle depletion. *ongoing  GOAL:   Patient will meet greater than or equal to 90% of their needs *met with TF  MONITOR:   Vent status, Labs, Weight trends, TF tolerance  REASON FOR ASSESSMENT:   Ventilator, Consult Enteral/tube feeding initiation and management  ASSESSMENT:   67 y.o. female with PMHx including adenocarcinoma of LLL now with recurrence, COPD, and anxiety presents with acute on chronic hypoxemic respiratory failure likely multifactorial  5/2 Admit 5/6 Intubated; Osm 1.5 started at 22mL/hr 5/8 Osm 1.5 increased to goal of 29mL/hr 5/15 Extubated by quickly re-intubated; TF restarted at 102mL/hr due to xray showing stomach distension 5/17 s/p trach  Patient trached to the vent, did not awake to sound of voice during visit.  Plan for SBT trails.   Patient continues to receive goal TF at 51mL/hr. Diet advancement pending patient's ability to wean off vent. Will likely need PEG if unable to wean.  Patient continues to have diarrhea, rectal tube in place. Will order Banatrol TF BID for 5 days to aid in bulking of stools. Re-evaluate at the end of 5 days and if diarrhea persists, extend another 5 days.   Admit weight: 174# Current weight: 178# Patient +7.9L which could be affecting current weight status   Medications reviewed and include: Remeron,  MVI Fentanyl  Labs reviewed:  HA1C 6.0 Blood Glucose 105-156 x24 hours   Diet Order:   Diet Order             Diet NPO time specified  Diet effective ____                   EDUCATION NEEDS:  Education needs have been addressed  Skin:  Skin Assessment: Reviewed RN Assessment  Last BM:  5/17 - rectal tube  Height:  Ht Readings from Last 1 Encounters:  01/31/23 5\' 8"  (1.727 m)   Weight:  Wt Readings from Last 1 Encounters:  02/01/23 81.1 kg    BMI:  Body mass index is 27.19 kg/m.  Estimated Nutritional Needs:  Kcal:  1900-2100 Protein:  95-120 g Fluid:  >1.9 L   Shelle Iron RD, LDN For contact information, refer to [facility].

## 2023-02-01 NOTE — Progress Notes (Signed)
NAME:  [name], MRN:  [id number], DOB:  1956/07/15, LOS: 20 ADMISSION DATE:  01/11/2023, CONSULTATION DATE:  01/16/2023 REFERRING MD: FMTS, CHIEF COMPLAINT: Acute Respiratory Failure in the setting of Lung Cancer, COPD and infiltrate on CXR   History of Present Illness:  67 year old woman who presented to [facility] 5/1 for nausea/vomiting/diarrhea and abdominal pain x 2 weeks. PMHx significant for COPD, lung CA (adenocarcinoma of LLL, biopsy planned for 5/7), bladder CA (s/p TURBT), GERD, and panic/anxiety. Admitted for AHRF in setting of CAP/COPD exacerbation to FMTS at [facility].   Found to have postobstructive PNA (RML/RLL) and R hilar mass with progressive respiratory failure requiring intubation 5/6. Patient also experienced panic attacks/anxiety leading to exacerbated WOB and VCD with associated stridor. Of note, patient is a DNR; however she endorses that she is not ready to die and wanted to be intubated. (See Prior Note documentation 01/16/2023). Transferred to [facility] 5/7 for ongoing oncological care.  Pertinent Medical History:   Past Medical History:  Diagnosis Date   Abnormal CT lung screening 07/10/2020   Anxiety    Anxiety state 02/28/2012   GAD7 : 6  PHQ9 : 6   Arthritis    Bladder cancer (HCC) 09/2020   urologist--- dr Berneice Heinrich, incidental finding on PET scan 12/ 2021,  s/p TURBT 09-18-2020 high grade papillary urothelial    Cancer related pain 03/11/2022   CAP (community acquired pneumonia) 04/20/2022   Centrilobular emphysema (HCC)    Chronic left shoulder pain 04/29/2021   Close exposure to COVID-19 virus 05/03/2019   Constipation 04/09/2019   Costochondritis, acute 08/14/2020   DDD (degenerative disc disease), lumbar    Depression    DOE (dyspnea on exertion)    10-29-2020  per pt can do house work without sob, when walks/ stairs stops frequently  (stated recovers quickly when stops)   Emphysema lung (HCC)    Epidermoid cyst 10/29/2018   Recurrent. Located on left hip at iliac  crest 2" anterior to midaxillary line. Removed 2013, 2020, 2022.    Epigastric pain 11/16/2020   Fibroid 2003   GERD (gastroesophageal reflux disease)    History of radiation therapy    Left lung- 10/06/20-10/16/20- Dr. Antony Blackbird   Leg cramping 11/13/2019   Lumbar spine pain 01/09/2015   Menopausal vaginal dryness 11/13/2019   Multiple closed fractures of ribs of left side 08/20/2022   Nocturnal enuresis 06/23/2020   Chronic overnight "leaking" of unknown source. Likely urine, but reports no color or odor. Volume enough for liners or light pads. Occurs every night.    Primary adenocarcinoma of lower lobe of left lung (HCC) 07/2020   oncologist--- dr Arbutus Ped---  dx 11/ 2021 bilateral lower lobe masses,  s/p bronchoscopy w/ bx's 07-28-2020 malignancy cells on left ;  completed SBRT bilateral lower lung 10-16-2020 5 fractions   Radiation-induced dermatitis    on 10-29-2020 pt complaint stomach and chest areas,  completed SBRT 10-16-2020   Seasonal allergies    Stage 3 severe COPD by GOLD classification [facility])    pulmonologist--- dr Tonia Brooms,  using anoro inhaler daily   Wears glasses    Significant Hospital Events: Including procedures, antibiotic start and stop dates in addition to other pertinent events   01/16/2023 Transferred to ICU for acute Respiratory Failure , intubated  5/7 Bronch by Icard. significant extrinsic compression of the airways in the right lower lobe and middle lobe. This is likely malignant obstruction. Bx sent. Transferred to [facility] for rad/onc eval. PICC placed 5/8 Stable still  high PEEP/FiO2 needs. Rad/onc consult requested  5/10 Tolerating SBT trial this AM on low-dose fentanyl and Precedex.  Propofol DC'd due to high triglycerides 5/11 Tolerates SBT's, pressure support 10/5 5/12 Off of pressors. Remains on vent, sedated with Precedex/Fentanyl. PMT consulted. 5/13 Weaning on PSV 14/8. > 10L positive over the course of admission. Diuresing. 5/14 Weaning on PSV. Ongoing  diuresis. 5/15 Tolerating SBT, PSV 5/5, following commands. Continue diuresis, Foley placed for persistent urinary retention. Extubated, decompensated quickly after requiring reintubation. DHT placed. 5/16 Agitated overnight, c/o throat pain with ETT, RASS 1-2 despite sedation. Failed extubation 5/15. Tolerating SBT PSV 10/5, FiO2 35%. ~1630 desaturated with inability to pass [place] suction/difficulty bagging; urgent tube exchange completed with bougie and blood clot removed with improvement in oxygenation/ventilation. 5/17 Tracheostomy placed 5/19 Back in atrial fibrillation in am, HR 150s 5/20 weaned on pressure support 10/5  Interim History / Subjective:   Remains chronic critically ill. Did not wean much yesterday due to severe anxiety and O2 desaturation. Afebrile Weaning on pressure support 8/5 this morning Remains on Precedex and fentanyl drips  Objective:  Blood pressure (!) 128/49, pulse 86, temperature 98.6 F (37 C), temperature source Oral, resp. rate (!) 27, height 5\' 8"  (1.727 m), weight 81.1 kg, SpO2 92 %.    Vent Mode: PSV FiO2 (%):  [40 %] 40 % Set Rate:  [10 bmp] 10 bmp Vt Set:  [510 mL] 510 mL PEEP:  [5 cmH20] 5 cmH20 Pressure Support:  [8 cmH20-10 cmH20] 8 cmH20 Plateau Pressure:  [13 cmH20-18 cmH20] 13 cmH20   Intake/Output Summary (Last 24 hours) at 02/01/2023 0935 Last data filed at 02/01/2023 4098 Gross per 24 hour  Intake 2377 ml  Output 1525 ml  Net 852 ml    Filed Weights   01/30/23 0500 01/31/23 0332 02/01/23 0448  Weight: 79.4 kg 81.1 kg 81.1 kg   Physical Examination: General: Chronically ill-appearing woman, anxious affect HEENT: Trach in place, not much secretions Neuro: Awake, interactive, nonfocal, communicates by writing, mouthing words CV: Irregular, no murmur PULM: Decreased breath sounds bilateral, no accessory muscle use GI: Nondistended with positive bowel sounds Extremities: No edema Skin: No rash  Chest x-ray 5/21 unchanged  right lower lobe infiltrate, slight increase in bilateral hilar infiltrates Labs show normal electrolytes, stable anemia, no leukocytosis  Resolved Hospital Problem List:  Hypertriglyceridemia due to propofol Hyponatremia Hypotension, Drug induced from sedation   Assessment & Plan:   Acute respiratory failure in setting of COPD, recurrent lung cancer w/ post obstructive atelectasis +/- post obstructive PNA. Intubated 5/7,   Vocal cord edema noted on reintubation 5/15 --Dexamethasone course completed. Tracheostomy placed 5/17, Zosyn completed 5/13 R hilar lung mass History of LLL adenocarcinoma Pulmonary edema Small bilateral pleural effusions. stable COPD  -Spontaneous breathing trials with goal to wean to trach collar  for upper airway obstruction/edema , severe anxiety impeding -trach collar again attempted today but did not tolerate due to desaturation -Continue Brovana, Yupelri, budesonide -VAP prevention orders -Ongoing XRT for right hilar mass and airway obstruction as per radiation oncology plans -Expect prolonged weaning here, will plan for LTAC  Severe anxiety History of panic attacks -PAD protocol , Precedex as blood pressure tolerates, trying to wean fentanyl drip --Oxycodone 5 every 6, clonzepam to 1 mg 3 times daily -Continue home Celexa, gabapentin and baclofen  Paroxysmal atrial fibrillation  Anticoagulated at baseline with apixaban. -Switch back to apixaban, no more procedures anticipated -if  RVR, can use metoprolol for rate control -Telemetry monitoring  At risk for malnutrition  -Tube feeding per NG tube, hopeful that we will not need PEG tube here if she weans to trach collar  Hyperglycemia -Sliding scale insulin per protocol -Goal CBG less than 180  Anemia of chronic disease -Following CBC.  No evidence active bleeding at this time -Transfusion goal hemoglobin 7.0  Urinary retention likely 2/2 sedation -Continue bethanechol 10 mg 3 times  daily -Consider DC Foley 5/24 for voiding trial -Lasix 40 IV today for negative balance  Best Practice: (right click and "Reselect all SmartList Selections" daily)   Diet/type: tubefeeds DVT prophylaxis: back to apixaban GI prophylaxis: H2B Lines: Central line - PICC Foley:  Yes Code Status:  full code Last date of multidisciplinary goals of care discussion 5/11 discussion with daughters, they have unrealistic goal of taking mom home with a tracheostomy and vent if needed.  Desire full CODE STATUS and reintubation.  It is proving more difficult to liberate her from the vent, will need LTAC in the interim  Critical care time: 31 minutes   The patient is critically ill with multiple organ system failure and requires high complexity decision making for assessment and support, frequent evaluation and titration of therapies, advanced monitoring, review of radiographic studies and interpretation of complex data.   Critical Care Time devoted to patient care services, exclusive of separately billable procedures, described in this note is  32 minutes.   Cyril Mourning MD. Tonny Bollman. [place] Pager : [phone]  If no response to pager , please call [phone] until 7 pm After 7:00 pm call Elink  [phone]     02/01/2023, 9:35 AM

## 2023-02-01 NOTE — TOC Initial Note (Addendum)
Transition of Care [facility]) - Initial/Assessment Note    Patient Details  Name: [name] MRN: [id number] Date of Birth: Apr 12, 1956  Transition of Care [facility]) CM/SW Contact:    Lavenia Atlas, RN Phone Number: 02/01/2023, 12:45 PM  Clinical Narrative:    Per chart review patient has PMH anxiety, lung adenocarcinoma (has returned), bladder cancer, COPD, emphysema. Patient was inpatient at [facility] on 5/1-5/7, transferred to [facility] on 01/17/23. New trach placed 5/17, patient continues radiation and not a candidate for chemo. PT has recommended LTAC faciltiy.  This RNCM attempted to speak with patient bedside however in radiation. This RNCM spoke with patient's daughter [name] who reports patient recently received home oxygen however unsure which vendor supplies oxygen. This RNCM advised Tonya of PT recommendation for LTAC, explained what LTAC is  Prior to speaking w/patient's daughter this RNCM communicated with Victorino Dike with [facility]/LTAC who reports patient maybe a candidate however [facility] unable to accept patient while on cancer treatment (chemo/radiation). [name] reports she would like private duty care at home however unable to afford the cost and would be interested in LTAC, understands LTAC may not take her mother due to actively receiving radiation treatment. [name] reports radiation goal is for 30 days which patient is in  week# 2. [name] [name] reports would be interested in palliative care and currently not interested in hospice care. This RNCM also spoke with Irving Burton with [facility] to confirm ability to accept patient with radiation treatment.Per Irving Burton they do not accept patients on radiation/cancer treatment.  TOC will continue to follow for needs.                    Expected Discharge Plan: Long Term Acute Care (LTAC) Barriers to Discharge: Continued Medical Work up   Patient Goals and CMS Choice Patient states their goals for this hospitalization and ongoing  recovery are:: return home or LTAC (spoke with daughter [name])   Choice offered to / list presented to : Adult Children [place] ownership interest in [facility].provided to:: Adult Children    Expected Discharge Plan and Services In-house Referral: Chaplain, Hospice / Palliative Care Discharge Planning Services: CM Consult Post Acute Care Choice: Long Term Acute Care (LTAC) Living arrangements for the past 2 months: Apartment Expected Discharge Date: 01/14/23               DME Arranged: N/A DME Agency: NA       HH Arranged: NA HH Agency: NA        Prior Living Arrangements/Services Living arrangements for the past 2 months: Apartment Lives with:: Self Patient language and need for interpreter reviewed:: Yes Do you feel safe going back to the place where you live?: Yes      Need for Family Participation in Patient Care: No (Comment) Care giver support system in place?: Yes (comment) Current home services: DME (has home oxygen however daughter unsure of DME vendor) Criminal Activity/Legal Involvement Pertinent to Current Situation/Hospitalization: No - Comment as needed  Activities of Daily Living Home Assistive Devices/Equipment: Blood pressure cuff, Eyeglasses, Oxygen ADL Screening (condition at time of admission) Patient's cognitive ability adequate to safely complete daily activities?: Yes Is the patient deaf or have difficulty hearing?: No Does the patient have difficulty seeing, even when wearing glasses/contacts?: No Does the patient have difficulty concentrating, remembering, or making decisions?: No Patient able to express need for assistance with ADLs?: Yes Does the patient have difficulty dressing or bathing?: No Independently performs ADLs?:  No Communication: Independent Dressing (OT): Independent Grooming: Independent Feeding: Independent Bathing: Independent Toileting: Independent In/Out Bed: Independent Walks in Home: Independent Does the  patient have difficulty walking or climbing stairs?: Yes Weakness of Legs: None Weakness of Arms/Hands: None  Permission Sought/Granted Permission sought to share information with : Case Manager Permission granted to share information with : Yes, Verbal Permission Granted  Share Information with NAME: Case manager           Emotional Assessment Appearance:: Appears stated age Attitude/Demeanor/Rapport: Unable to Assess Affect (typically observed): Unable to Assess Orientation: : Oriented to Self, Oriented to Place, Oriented to  Time Alcohol / Substance Use: Not Applicable- patient has history of 41 years tobacco use 1ppd Psych Involvement: No (comment)  Admission diagnosis:  Pneumonia of right lower lobe due to infectious organism [J18.9] Acute pancreatitis, unspecified complication status, unspecified pancreatitis type [K85.90] Acute hypoxic respiratory failure (HCC) [J96.01] Respiratory distress [R06.03] Respiratory failure (HCC) [J96.90] Patient Active Problem List   Diagnosis Date Noted   Counseling and coordination of care 01/23/2023   Need for emotional support 01/22/2023   Goals of care, counseling/discussion 01/22/2023   Palliative care encounter 01/22/2023   Airway obstruction 01/18/2023   Mass of upper lobe of right lung 01/18/2023   Primary cancer of right lower lobe of lung (HCC) 01/18/2023   Respiratory failure (HCC) 01/16/2023   Atrial fibrillation (HCC) 01/13/2023   Protein-calorie malnutrition, severe 01/13/2023   Vocal cord dysfunction 01/13/2023   Acute respiratory failure with hypoxia (HCC) 01/13/2023   Acute hypoxic respiratory failure (HCC) - secondary to CAP 01/12/2023   Anxiety 01/12/2023   Respiratory distress 01/12/2023   Pneumonia of right lower lobe due to infectious organism 01/12/2023   Adenopathy 01/09/2023   Primary hypertension 11/08/2022   Insufficiency fracture 10/03/2022   COPD exacerbation (HCC) 03/21/2022   Stage 3 severe COPD by  GOLD classification (HCC) 03/03/2022   Post radiation rib pain 02/28/2022   Osteopenia after menopause 01/13/2021   Pulmonary emphysema (HCC) 10/07/2020   Primary adenocarcinoma of lower lobe of left lung (HCC) 08/18/2020   Bladder cancer (HCC) 08/18/2020   Chronic hand pain, right 03/26/2019   Chronic bilateral low back pain without sciatica 02/08/2018   HLD, secondary prevention, LDL goal <100 12/14/2017   Insomnia 10/30/2015   Oral herpes 10/19/2015   Arthritis of spine 03/27/2015   Hot flashes, menopausal 02/28/2012   Restless legs 02/28/2012   PCP:  Fayette Pho, MD Pharmacy:   [facility]ORE [phone] [place], [place] - [address] AT [address] [place] [address] Phone: [phone] Fax: [phone]  [facility] [place] [place] - [address] [place] [address] Phone: [phone] Fax: [phone]  [facility] DR[facility] #[id number] [place], [place] - [address] AT [facility] OF [address] [place] [address] Phone: [phone] Fax: [phone]  [facility] [address] [place] [address] Phone: [phone] Fax: [phone]     Social Determinants of Health (SDOH) Social History: SDOH Screenings   Food Insecurity: No Food Insecurity (01/13/2023)  Housing: Low Risk  (01/13/2023)  Transportation Needs: No Transportation Needs (01/13/2023)  Utilities: Not At Risk (01/13/2023)  Alcohol Screen: Low Risk  (12/13/2022)  Depression (PHQ2-9): Low Risk  (12/13/2022)  Financial Resource Strain: Low Risk  (12/13/2022)  Physical Activity: Inactive (12/13/2022)  Social Connections: Moderately Integrated (12/13/2022)  Stress: No  Stress Concern Present (12/13/2022)  Tobacco Use: Medium Risk (01/22/2023)   SDOH Interventions:     Readmission Risk Interventions    02/01/2023   12:07 PM  Readmission Risk Prevention Plan   Transportation Screening Complete  PCP or Specialist Appt within 3-5 Days Complete  HRI or Home Care Consult Complete  Social Work Consult for Recovery Care Planning/Counseling Complete  Palliative Care Screening Complete  Medication Review Oceanographer) Complete

## 2023-02-02 ENCOUNTER — Ambulatory Visit
Admission: RE | Admit: 2023-02-02 | Discharge: 2023-02-02 | Disposition: A | Discharge status: Home / Self Care | Payer: 59 | Source: Ambulatory Visit | Attending: Radiation Oncology | Admitting: Radiation Oncology

## 2023-02-02 ENCOUNTER — Other Ambulatory Visit: Payer: Self-pay

## 2023-02-02 DIAGNOSIS — Z510 Encounter for antineoplastic radiation therapy: Secondary | ICD-10-CM | POA: Diagnosis not present

## 2023-02-02 DIAGNOSIS — Z87891 Personal history of nicotine dependence: Secondary | ICD-10-CM | POA: Diagnosis not present

## 2023-02-02 DIAGNOSIS — J9601 Acute respiratory failure with hypoxia: Secondary | ICD-10-CM | POA: Diagnosis not present

## 2023-02-02 DIAGNOSIS — C3432 Malignant neoplasm of lower lobe, left bronchus or lung: Secondary | ICD-10-CM | POA: Diagnosis not present

## 2023-02-02 LAB — RAD ONC ARIA SESSION SUMMARY
Course Elapsed Days: 15
Plan Fractions Treated to Date: 9
Plan Prescribed Dose Per Fraction: 2 Gy
Plan Total Fractions Prescribed: 27
Plan Total Prescribed Dose: 54 Gy
Reference Point Dosage Given to Date: 18 Gy
Reference Point Session Dosage Given: 2 Gy
Session Number: 12

## 2023-02-02 LAB — CBC WITH DIFFERENTIAL/PLATELET
Abs Immature Granulocytes: 0.05 10*3/uL (ref 0.00–0.07)
Basophils Absolute: 0 10*3/uL (ref 0.0–0.1)
Basophils Relative: 0 %
Eosinophils Absolute: 0.5 10*3/uL (ref 0.0–0.5)
Eosinophils Relative: 6 %
HCT: 25.9 % — ABNORMAL LOW (ref 36.0–46.0)
Hemoglobin: 8 g/dL — ABNORMAL LOW (ref 12.0–15.0)
Immature Granulocytes: 1 %
Lymphocytes Relative: 6 %
Lymphs Abs: 0.5 10*3/uL — ABNORMAL LOW (ref 0.7–4.0)
MCH: 27 pg (ref 26.0–34.0)
MCHC: 30.9 g/dL (ref 30.0–36.0)
MCV: 87.5 fL (ref 80.0–100.0)
Monocytes Absolute: 0.5 10*3/uL (ref 0.1–1.0)
Monocytes Relative: 7 %
Neutro Abs: 5.9 10*3/uL (ref 1.7–7.7)
Neutrophils Relative %: 80 %
Platelets: 405 10*3/uL — ABNORMAL HIGH (ref 150–400)
RBC: 2.96 MIL/uL — ABNORMAL LOW (ref 3.87–5.11)
RDW: 15.1 % (ref 11.5–15.5)
WBC: 7.4 10*3/uL (ref 4.0–10.5)
nRBC: 0 % (ref 0.0–0.2)

## 2023-02-02 LAB — BASIC METABOLIC PANEL
Anion gap: 8 (ref 5–15)
BUN: 29 mg/dL — ABNORMAL HIGH (ref 8–23)
CO2: 28 mmol/L (ref 22–32)
Calcium: 8.3 mg/dL — ABNORMAL LOW (ref 8.9–10.3)
Chloride: 97 mmol/L — ABNORMAL LOW (ref 98–111)
Creatinine, Ser: 0.84 mg/dL (ref 0.44–1.00)
GFR, Estimated: >60 mL/min (ref >60)
Glucose, Bld: 145 mg/dL — ABNORMAL HIGH (ref 70–99)
Potassium: 4.5 mmol/L (ref 3.5–5.1)
Sodium: 133 mmol/L — ABNORMAL LOW (ref 135–145)

## 2023-02-02 LAB — GLUCOSE, CAPILLARY
Glucose-Capillary: 139 mg/dL — ABNORMAL HIGH (ref 70–99)
Glucose-Capillary: 134 mg/dL — ABNORMAL HIGH (ref 70–99)
Glucose-Capillary: 186 mg/dL — ABNORMAL HIGH (ref 70–99)
Glucose-Capillary: 175 mg/dL — ABNORMAL HIGH (ref 70–99)

## 2023-02-02 MED ORDER — FENTANYL 50 MCG/HR TD PT72
1.0000 | MEDICATED_PATCH | TRANSDERMAL | Status: DC
  Administered 2023-02-02 – 2023-02-05 (×2): 1 via TRANSDERMAL
  Filled 2023-02-02 (×2): qty 1

## 2023-02-02 MED ORDER — POTASSIUM CHLORIDE 20 MEQ PO PACK
40.0000 meq | PACK | Freq: Three times a day (TID) | ORAL | Status: AC
  Administered 2023-02-02 – 2023-02-03 (×3): 40 meq
  Filled 2023-02-02 (×3): qty 2

## 2023-02-02 MED ORDER — NOREPINEPHRINE 4 MG/250ML-% IV SOLN
0.0000 ug/min | INTRAVENOUS | Status: DC
  Administered 2023-02-02 – 2023-02-05 (×3): 2 ug/min via INTRAVENOUS
  Administered 2023-02-07: 3 ug/min via INTRAVENOUS
  Administered 2023-02-07: 2 ug/min via INTRAVENOUS
  Filled 2023-02-02 (×4): qty 250

## 2023-02-02 MED ORDER — FUROSEMIDE 10 MG/ML IJ SOLN
40.0000 mg | Freq: Three times a day (TID) | INTRAMUSCULAR | Status: AC
  Administered 2023-02-02 (×3): 40 mg via INTRAVENOUS
  Filled 2023-02-02 (×3): qty 4

## 2023-02-02 MED ORDER — QUETIAPINE FUMARATE 50 MG PO TABS
50.0000 mg | ORAL_TABLET | Freq: Two times a day (BID) | ORAL | Status: DC
  Administered 2023-02-02 – 2023-02-06 (×10): 50 mg
  Filled 2023-02-02 (×10): qty 1

## 2023-02-02 MED ORDER — MIDODRINE HCL 5 MG PO TABS
5.0000 mg | ORAL_TABLET | Freq: Three times a day (TID) | ORAL | Status: DC
  Administered 2023-02-02 – 2023-02-03 (×4): 5 mg
  Filled 2023-02-02 (×5): qty 1

## 2023-02-02 NOTE — Progress Notes (Signed)
Pt was weaning earlier today. Had some anxiety (see my note) but VTs were ok. Called to room, pt's RR 50s w/ shallow respirations and inc'd accessory use. Had some thin blood in trach. RT reports no large clotts sxn'd when attempted trach sxn. She is now s/p both versed and fent bolus. She is laying in bed. Appears more calm BUT RR still high 30s to low 40s. Back on IMV mode.  Plan Will inc PEEP to 8, I worry she still intermittently de-recruits  Cont anxiolytic/pain rx as outlined in earlier note  [name] ACNP-BC [facility] Pulmonary/Critical Care Pager # [phone] OR # [phone] if no answer

## 2023-02-02 NOTE — Progress Notes (Signed)
RT NOTE:  Pt transported to and from radiation via ventilator, BMV at bedside at all times. No complications noted.

## 2023-02-02 NOTE — Progress Notes (Signed)
ANTICOAGULATION CONSULT NOTE - Follow Up Consult  Pharmacy Consult for Eliquis Indication: atrial fibrillation  Allergies  Allergen Reactions   Codeine Nausea And Vomiting and Other (See Comments)    Hallucinations   Prednisone Other (See Comments)    'Made me feel funny inside my head"-pt cannot be specific    Patient Measurements: Height: 5\' 8"  (172.7 cm) Weight: 80.5 kg (177 lb 7.5 oz) IBW/kg (Calculated) : 63.9 Heparin Dosing Weight:  79.4 kg  Vital Signs: Temp: 98.4 F (36.9 C) (05/23 0800) Temp Source: Axillary (05/23 0800) BP: 125/55 (05/23 0800) Pulse Rate: 100 (05/23 0800)  Labs: Recent Labs    01/31/23 0329 02/01/23 0422 02/02/23 0430  HGB 8.6* 8.4* 8.0*  HCT 28.1* 27.1* 25.9*  PLT 378 381 405*  CREATININE 0.83 0.73 0.84     Estimated Creatinine Clearance: 71.3 mL/min (by C-G formula based on SCr of 0.84 mg/dL).  Assessment:  AC/Heme: Eliquis started for new Afib (LD 5/4 ) >> UFH bridging for bronch/trach 5/17. - counseling done 5/3; $0 copay 5/20 Eliquis resumed per tube BID  Goal of Therapy:  Reduce risk of stroke and systemic embolism Monitor platelets by anticoagulation protocol: Yes   Plan:  Continue Eliquis 5 mg per tube BID Pharmacy will sign off consult and continue to monitor CBC & signs/symptoms of bleeding   Thank you for allowing pharmacy to be a part of this patient's care.  Selinda Eon, PharmD, BCPS Clinical Pharmacist [place] Please utilize Amion for appropriate phone number to reach the unit pharmacist [facility]) 02/02/2023 9:44 AM

## 2023-02-02 NOTE — Progress Notes (Signed)
Added low dose vasoactive gtt earlier today  Suspect that this is all from sedation  Plan Add midodrine   Simonne Martinet ACNP-BC [facility] Pulmonary/Critical Care Pager # [phone] OR # [phone] if no answer

## 2023-02-02 NOTE — Progress Notes (Signed)
NAME:  [name], MRN:  [id number], DOB:  Oct 10, 1955, LOS: 21 ADMISSION DATE:  01/11/2023, CONSULTATION DATE:  01/16/2023 REFERRING MD: FMTS, CHIEF COMPLAINT: Acute Respiratory Failure in the setting of Lung Cancer, COPD and infiltrate on CXR   History of Present Illness:  67 year old woman who presented to [facility] 5/1 for nausea/vomiting/diarrhea and abdominal pain x 2 weeks. PMHx significant for COPD, lung CA (adenocarcinoma of LLL, biopsy planned for 5/7), bladder CA (s/p TURBT), GERD, and panic/anxiety. Admitted for AHRF in setting of CAP/COPD exacerbation to FMTS at [facility].   Found to have postobstructive PNA (RML/RLL) and R hilar mass with progressive respiratory failure requiring intubation 5/6. Patient also experienced panic attacks/anxiety leading to exacerbated WOB and VCD with associated stridor. Of note, patient is a DNR; however she endorses that she is not ready to die and wanted to be intubated. (See Prior Note documentation 01/16/2023). Transferred to [facility] 5/7 for ongoing oncological care.  Pertinent Medical History:   Past Medical History:  Diagnosis Date   Abnormal CT lung screening 07/10/2020   Anxiety    Anxiety state 02/28/2012   GAD7 : 6  PHQ9 : 6   Arthritis    Bladder cancer (HCC) 09/2020   urologist--- dr Berneice Heinrich, incidental finding on PET scan 12/ 2021,  s/p TURBT 09-18-2020 high grade papillary urothelial    Cancer related pain 03/11/2022   CAP (community acquired pneumonia) 04/20/2022   Centrilobular emphysema (HCC)    Chronic left shoulder pain 04/29/2021   Close exposure to COVID-19 virus 05/03/2019   Constipation 04/09/2019   Costochondritis, acute 08/14/2020   DDD (degenerative disc disease), lumbar    Depression    DOE (dyspnea on exertion)    10-29-2020  per pt can do house work without sob, when walks/ stairs stops frequently  (stated recovers quickly when stops)   Emphysema lung (HCC)    Epidermoid cyst 10/29/2018   Recurrent. Located on left hip at iliac  crest 2" anterior to midaxillary line. Removed 2013, 2020, 2022.    Epigastric pain 11/16/2020   Fibroid 2003   GERD (gastroesophageal reflux disease)    History of radiation therapy    Left lung- 10/06/20-10/16/20- Dr. Antony Blackbird   Leg cramping 11/13/2019   Lumbar spine pain 01/09/2015   Menopausal vaginal dryness 11/13/2019   Multiple closed fractures of ribs of left side 08/20/2022   Nocturnal enuresis 06/23/2020   Chronic overnight "leaking" of unknown source. Likely urine, but reports no color or odor. Volume enough for liners or light pads. Occurs every night.    Primary adenocarcinoma of lower lobe of left lung (HCC) 07/2020   oncologist--- dr Arbutus Ped---  dx 11/ 2021 bilateral lower lobe masses,  s/p bronchoscopy w/ bx's 07-28-2020 malignancy cells on left ;  completed SBRT bilateral lower lung 10-16-2020 5 fractions   Radiation-induced dermatitis    on 10-29-2020 pt complaint stomach and chest areas,  completed SBRT 10-16-2020   Seasonal allergies    Stage 3 severe COPD by GOLD classification [facility])    pulmonologist--- dr Tonia Brooms,  using anoro inhaler daily   Wears glasses    Significant Hospital Events: Including procedures, antibiotic start and stop dates in addition to other pertinent events   01/16/2023 Transferred to ICU for acute Respiratory Failure , intubated  5/7 Bronch by Icard. significant extrinsic compression of the airways in the right lower lobe and middle lobe. This is likely malignant obstruction. Bx sent. Transferred to [facility] for rad/onc eval. PICC placed 5/8 Stable still  high PEEP/FiO2 needs. Rad/onc consult requested  5/10 Tolerating SBT trial this AM on low-dose fentanyl and Precedex.  Propofol DC'd due to high triglycerides 5/11 Tolerates SBT's, pressure support 10/5 5/12 Off of pressors. Remains on vent, sedated with Precedex/Fentanyl. PMT consulted. 5/13 Weaning on PSV 14/8. > 10L positive over the course of admission. Diuresing. 5/14 Weaning on PSV. Ongoing  diuresis. 5/15 Tolerating SBT, PSV 5/5, following commands. Continue diuresis, Foley placed for persistent urinary retention. Extubated, decompensated quickly after requiring reintubation. DHT placed. 5/16 Agitated overnight, c/o throat pain with ETT, RASS 1-2 despite sedation. Failed extubation 5/15. Tolerating SBT PSV 10/5, FiO2 35%. ~1630 desaturated with inability to pass Ballard suction/difficulty bagging; urgent tube exchange completed with bougie and blood clot removed with improvement in oxygenation/ventilation. 5/17 Tracheostomy placed 5/19 Back in atrial fibrillation in am, HR 150s 5/20 weaned on pressure support 10/5 5/21 desaturated during weaning. Still on dex and fent gtt 5/23 added fent patch and seroquel   Interim History / Subjective:  C/o trach site discomfort. Appears to be d/t pressure from vent tubing   Objective:  Blood pressure (Abnormal) 113/52, pulse 97, temperature 99.1 F (37.3 C), temperature source Axillary, resp. rate (Abnormal) 23, height 5\' 8"  (1.727 m), weight 80.5 kg, SpO2 94 %.    Vent Mode: SIMV;PSV FiO2 (%):  [40 %] 40 % Set Rate:  [10 bmp] 10 bmp Vt Set:  [510 mL] 510 mL PEEP:  [5 cmH20] 5 cmH20 Pressure Support:  [8 cmH20-10 cmH20] 10 cmH20 Plateau Pressure:  [11 cmH20-17 cmH20] 17 cmH20   Intake/Output Summary (Last 24 hours) at 02/02/2023 0718 Last data filed at 02/02/2023 1610 Gross per 24 hour  Intake 2739.53 ml  Output 2600 ml  Net 139.53 ml   Filed Weights   01/31/23 0332 02/01/23 0448 02/02/23 0500  Weight: 81.1 kg 81.1 kg 80.5 kg   Physical Examination: General anxious chronically ill appearing 67 year old female currently sitting up in bed and in no distress on PSV although does endorse dyspnea  Trach # 6 trach is midline. No JVD  Pulm clear. Dec bases PCXR some edema on left. On-going atx on right BUT improved. (Last CXR 20th) Card rrr Abd soft Ext warm dependent edema pulses strong Neuro/psych anxious moves all ext. Mult  somatic complaints GU cl yellow   Resolved Hospital Problem List:  Hypertriglyceridemia due to propofol Hyponatremia Hypotension, Drug induced from sedation  Vocal cord edema  Post obstructive PNA: Zosyn completed 5/13 Assessment & Plan:   Tracheostomy dependence (d/t upper airway obstruction) w/ failure to wean in setting of , recurrent lung cancer (R) hilar lung mass  w/ post obstructive atelectasis History of LLL adenocarcinoma &  COPD -barrier to weaning now anxiety now that has trach Plan Daily SBT & PSV as tolerated.  Continue Roxy Manns, budesonide VAP prevention orders Optimize anxiety regimen  Push lasix (she's over 11 liters positive) Ongoing XRT for right hilar mass and airway obstruction as per radiation oncology plans Expect prolonged weaning here, will plan for LTAC  Severe anxiety History of panic attacks Plan PAD protocol , Precedex as blood pressure tolerates Oxycodone 5 every 6, clonzepam to 1 mg 3 times daily Continue home Celexa, gabapentin and baclofen Add fent patch to see if we can get her off gtt (w/ plan to dc gtt 5/24) Add Seroquel VT  I am not sure how much the precedex is helping. Will see how she does w/ seroquel may be reasonable to just stop it  Paroxysmal atrial fibrillation  Plan apixaban. Tele PRN BB    At risk for malnutrition  Plan Tube feeding per NG tube, hopeful that we will not need PEG tube here if she weans to trach collar  Hyperglycemia Plan Ssi  Goal glucose 140-180   Anemia of chronic disease -HGB stable  Plan Trend cbc Transfusion goal hemoglobin 7.0  Urinary retention likely 2/2 sedation Plan Continue bethanechol 10 mg 3 times daily Keep foley while we push diuresis    Best Practice: (right click and "Reselect all SmartList Selections" daily)   Diet/type: tubefeeds DVT prophylaxis: back to apixaban GI prophylaxis: H2B Lines: Central line - PICC Foley:  Yes Code Status:  full code Last date of  multidisciplinary goals of care discussion 5/11 discussion with daughters, they have unrealistic goal of taking mom home with a tracheostomy and vent if needed.  Desire full CODE STATUS and reintubation.  It is proving more difficult to liberate her from the vent, will need LTAC in the interim  Critical care time: 32 min    Simonne Martinet ACNP-BC [facility] Pulmonary/Critical Care Pager # [phone] OR # [phone] if no answer    02/02/2023, 7:18 AM

## 2023-02-03 ENCOUNTER — Ambulatory Visit
Admission: RE | Admit: 2023-02-03 | Discharge: 2023-02-03 | Disposition: A | Discharge status: Home / Self Care | Payer: 59 | Source: Ambulatory Visit | Attending: Radiation Oncology | Admitting: Radiation Oncology

## 2023-02-03 ENCOUNTER — Other Ambulatory Visit: Payer: Self-pay

## 2023-02-03 DIAGNOSIS — J9601 Acute respiratory failure with hypoxia: Secondary | ICD-10-CM | POA: Diagnosis not present

## 2023-02-03 DIAGNOSIS — Z87891 Personal history of nicotine dependence: Secondary | ICD-10-CM | POA: Diagnosis not present

## 2023-02-03 DIAGNOSIS — C3432 Malignant neoplasm of lower lobe, left bronchus or lung: Secondary | ICD-10-CM | POA: Diagnosis not present

## 2023-02-03 DIAGNOSIS — Z510 Encounter for antineoplastic radiation therapy: Secondary | ICD-10-CM | POA: Diagnosis not present

## 2023-02-03 LAB — RAD ONC ARIA SESSION SUMMARY
Course Elapsed Days: 16
Plan Fractions Treated to Date: 10
Plan Prescribed Dose Per Fraction: 2 Gy
Plan Total Fractions Prescribed: 27
Plan Total Prescribed Dose: 54 Gy
Reference Point Dosage Given to Date: 20 Gy
Reference Point Session Dosage Given: 2 Gy
Session Number: 13

## 2023-02-03 LAB — COMPREHENSIVE METABOLIC PANEL
ALT: 47 U/L — ABNORMAL HIGH (ref 0–44)
AST: 28 U/L (ref 15–41)
Albumin: 2.4 g/dL — ABNORMAL LOW (ref 3.5–5.0)
Alkaline Phosphatase: 83 U/L (ref 38–126)
Anion gap: 9 (ref 5–15)
BUN: 35 mg/dL — ABNORMAL HIGH (ref 8–23)
CO2: 30 mmol/L (ref 22–32)
Calcium: 8.5 mg/dL — ABNORMAL LOW (ref 8.9–10.3)
Chloride: 96 mmol/L — ABNORMAL LOW (ref 98–111)
Creatinine, Ser: 0.97 mg/dL (ref 0.44–1.00)
GFR, Estimated: >60 mL/min (ref >60)
Glucose, Bld: 150 mg/dL — ABNORMAL HIGH (ref 70–99)
Potassium: 4.7 mmol/L (ref 3.5–5.1)
Sodium: 135 mmol/L (ref 135–145)
Total Bilirubin: 0.7 mg/dL (ref 0.3–1.2)
Total Protein: 6.9 g/dL (ref 6.5–8.1)

## 2023-02-03 LAB — GLUCOSE, CAPILLARY
Glucose-Capillary: 131 mg/dL — ABNORMAL HIGH (ref 70–99)
Glucose-Capillary: 143 mg/dL — ABNORMAL HIGH (ref 70–99)
Glucose-Capillary: 129 mg/dL — ABNORMAL HIGH (ref 70–99)
Glucose-Capillary: 133 mg/dL — ABNORMAL HIGH (ref 70–99)
Glucose-Capillary: 181 mg/dL — ABNORMAL HIGH (ref 70–99)
Glucose-Capillary: 101 mg/dL — ABNORMAL HIGH (ref 70–99)
Glucose-Capillary: 138 mg/dL — ABNORMAL HIGH (ref 70–99)

## 2023-02-03 LAB — CBC
HCT: 28.3 % — ABNORMAL LOW (ref 36.0–46.0)
Hemoglobin: 8.7 g/dL — ABNORMAL LOW (ref 12.0–15.0)
MCH: 26.9 pg (ref 26.0–34.0)
MCHC: 30.7 g/dL (ref 30.0–36.0)
MCV: 87.3 fL (ref 80.0–100.0)
Platelets: 412 10*3/uL — ABNORMAL HIGH (ref 150–400)
RBC: 3.24 MIL/uL — ABNORMAL LOW (ref 3.87–5.11)
RDW: 15.1 % (ref 11.5–15.5)
WBC: 7.5 10*3/uL (ref 4.0–10.5)
nRBC: 0 % (ref 0.0–0.2)

## 2023-02-03 MED ORDER — OXYCODONE HCL 5 MG PO TABS
10.0000 mg | ORAL_TABLET | Freq: Four times a day (QID) | ORAL | Status: DC
  Administered 2023-02-03 – 2023-02-07 (×16): 10 mg
  Filled 2023-02-03 (×16): qty 2

## 2023-02-03 NOTE — Progress Notes (Signed)
RT NOTE:  Pt transported to and from radiation via ventilator with no complications noted. BMV at bedside during transport.

## 2023-02-03 NOTE — Progress Notes (Signed)
NAME:  [name], MRN:  [id number], DOB:  07/13/1956, LOS: 22 ADMISSION DATE:  01/11/2023, CONSULTATION DATE:  01/16/2023 REFERRING MD: FMTS, CHIEF COMPLAINT: Acute Respiratory Failure in the setting of Lung Cancer, COPD and infiltrate on CXR   History of Present Illness:  67 year old woman who presented to [facility] 5/1 for nausea/vomiting/diarrhea and abdominal pain x 2 weeks. PMHx significant for COPD, lung CA (adenocarcinoma of LLL, biopsy planned for 5/7), bladder CA (s/p TURBT), GERD, and panic/anxiety. Admitted for AHRF in setting of CAP/COPD exacerbation to FMTS at [facility].   Found to have postobstructive PNA (RML/RLL) and R hilar mass with progressive respiratory failure requiring intubation 5/6. Patient also experienced panic attacks/anxiety leading to exacerbated WOB and VCD with associated stridor. Of note, patient is a DNR; however she endorses that she is not ready to die and wanted to be intubated. (See Prior Note documentation 01/16/2023). Transferred to [facility] 5/7 for ongoing oncological care.  Pertinent Medical History:   Past Medical History:  Diagnosis Date   Abnormal CT lung screening 07/10/2020   Anxiety    Anxiety state 02/28/2012   GAD7 : 6  PHQ9 : 6   Arthritis    Bladder cancer (HCC) 09/2020   urologist--- dr Berneice Heinrich, incidental finding on PET scan 12/ 2021,  s/p TURBT 09-18-2020 high grade papillary urothelial    Cancer related pain 03/11/2022   CAP (community acquired pneumonia) 04/20/2022   Centrilobular emphysema (HCC)    Chronic left shoulder pain 04/29/2021   Close exposure to COVID-19 virus 05/03/2019   Constipation 04/09/2019   Costochondritis, acute 08/14/2020   DDD (degenerative disc disease), lumbar    Depression    DOE (dyspnea on exertion)    10-29-2020  per pt can do house work without sob, when walks/ stairs stops frequently  (stated recovers quickly when stops)   Emphysema lung (HCC)    Epidermoid cyst 10/29/2018   Recurrent. Located on left hip at iliac  crest 2" anterior to midaxillary line. Removed 2013, 2020, 2022.    Epigastric pain 11/16/2020   Fibroid 2003   GERD (gastroesophageal reflux disease)    History of radiation therapy    Left lung- 10/06/20-10/16/20- Dr. Antony Blackbird   Leg cramping 11/13/2019   Lumbar spine pain 01/09/2015   Menopausal vaginal dryness 11/13/2019   Multiple closed fractures of ribs of left side 08/20/2022   Nocturnal enuresis 06/23/2020   Chronic overnight "leaking" of unknown source. Likely urine, but reports no color or odor. Volume enough for liners or light pads. Occurs every night.    Primary adenocarcinoma of lower lobe of left lung (HCC) 07/2020   oncologist--- dr Arbutus Ped---  dx 11/ 2021 bilateral lower lobe masses,  s/p bronchoscopy w/ bx's 07-28-2020 malignancy cells on left ;  completed SBRT bilateral lower lung 10-16-2020 5 fractions   Radiation-induced dermatitis    on 10-29-2020 pt complaint stomach and chest areas,  completed SBRT 10-16-2020   Seasonal allergies    Stage 3 severe COPD by GOLD classification [facility])    pulmonologist--- dr Tonia Brooms,  using anoro inhaler daily   Wears glasses    Significant Hospital Events: Including procedures, antibiotic start and stop dates in addition to other pertinent events   01/16/2023 Transferred to ICU for acute Respiratory Failure , intubated  5/7 Bronch by Icard. significant extrinsic compression of the airways in the right lower lobe and middle lobe. This is likely malignant obstruction. Bx sent. Transferred to [facility] for rad/onc eval. PICC placed 5/8 Stable still  high PEEP/FiO2 needs. Rad/onc consult requested  5/10 Tolerating SBT trial this AM on low-dose fentanyl and Precedex.  Propofol DC'd due to high triglycerides 5/11 Tolerates SBT's, pressure support 10/5 5/12 Off of pressors. Remains on vent, sedated with Precedex/Fentanyl. PMT consulted. 5/13 Weaning on PSV 14/8. > 10L positive over the course of admission. Diuresing. 5/14 Weaning on PSV. Ongoing  diuresis. 5/15 Tolerating SBT, PSV 5/5, following commands. Continue diuresis, Foley placed for persistent urinary retention. Extubated, decompensated quickly after requiring reintubation. DHT placed. 5/16 Agitated overnight, c/o throat pain with ETT, RASS 1-2 despite sedation. Failed extubation 5/15. Tolerating SBT PSV 10/5, FiO2 35%. ~1630 desaturated with inability to pass Ballard suction/difficulty bagging; urgent tube exchange completed with bougie and blood clot removed with improvement in oxygenation/ventilation. 5/17 Tracheostomy placed 5/19 Back in atrial fibrillation in am, HR 150s 5/20 weaned on pressure support 10/5 5/21 desaturated during weaning. Still on dex and fent gtt 5/23 added fent patch and seroquel had to have NE gtt and midodrine started for drug related hypotension 5/24 trialing fent gtt off. Foley removed  Interim History / Subjective:  Sedated this am   Objective:  Blood pressure (Abnormal) 106/58, pulse 93, temperature 99.7 F (37.6 C), temperature source Axillary, resp. rate (Abnormal) 22, height 5\' 8"  (1.727 m), weight 80.5 kg, SpO2 96 %.    Vent Mode: SIMV;PSV FiO2 (%):  [40 %] 40 % Set Rate:  [10 bmp] 10 bmp Vt Set:  [510 mL] 510 mL PEEP:  [5 cmH20-8 cmH20] 8 cmH20 Pressure Support:  [8 cmH20-10 cmH20] 10 cmH20 Plateau Pressure:  [17 cmH20-20 cmH20] 17 cmH20   Intake/Output Summary (Last 24 hours) at 02/03/2023 0721 Last data filed at 02/03/2023 0532 Gross per 24 hour  Intake 2458.24 ml  Output 5275 ml  Net -2816.76 ml   Filed Weights   01/31/23 0332 02/01/23 0448 02/02/23 0500  Weight: 81.1 kg 81.1 kg 80.5 kg   Physical Examination: General chronically ill 67 year old female a little more sedated this am  HENT NCAT trach is 6 cuffed. Midline unremarkable Pulm coarse breath sounds Card rrr Abd soft Ext warm and dry had dependent edema  Neuro/psych more sleepy today (still on fent gtt) moves all ext. Follows commands. Appropriate  Gu cl yellow    Resolved Hospital Problem List:  Hypertriglyceridemia due to propofol Hyponatremia Hypotension, Drug induced from sedation  Vocal cord edema  Post obstructive PNA: Zosyn completed 5/13 Assessment & Plan:   Tracheostomy dependence (d/t upper airway obstruction) w/ failure to wean in setting of , recurrent lung cancer (R) hilar lung mass  w/ post obstructive atelectasis History of LLL adenocarcinoma &  COPD -barrier to weaning now anxiety now that has trach Plan Daily SBT & PSV as tolerated.  Continue Roxy Manns, budesonide VAP prevention orders Optimize anxiety regimen (see below) Hold lasix today given low BP Ongoing XRT for right hilar mass and airway obstruction as per radiation oncology plans Expect prolonged weaning here, I am getting concerned that she may not come off vent   Severe anxiety History of panic attacks Plan PAD protocol , Precedex as blood pressure tolerates Oxycodone 5 every 6, clonzepam 1 mg 3 times daily Continue home Celexa, gabapentin and baclofen Dc fent gtt -->now has 50 mcg patch Cont Seroquel 50mg  q 12; will re-assess later today. May be able to stop dex too.    Drug related hypotension 2/2 sedation.  We up-titrated sedating regimen yesterday w/ fent patch and seroquel VT.  Plan Cont midodrine  Dc fent gtt today (this may help) Cont NE for MAP > 65  Cont tele   Paroxysmal atrial fibrillation  Plan apixaban. Tele PRN BB    At risk for malnutrition  Plan Tube feeding per NG tube, hopeful that we will not need PEG tube here if she weans to trach collar; I am getting more concerned about this   Hyperglycemia Plan Ssi  Goal glucose 140-180   Anemia of chronic disease -HGB stable  Plan Trend cbc Transfusion goal hemoglobin 7.0  Urinary retention likely 2/2 sedation Plan Continue bethanechol 10 mg 3 times daily Will dc foley today   Best Practice: (right click and "Reselect all SmartList Selections" daily)   Diet/type:  tubefeeds DVT prophylaxis: back to apixaban GI prophylaxis: H2B Lines: Central line - PICC Foley:  Yes Code Status:  full code Last date of multidisciplinary goals of care discussion 5/11 discussion with daughters, they have unrealistic goal of taking mom home with a tracheostomy and vent if needed.  Desire full CODE STATUS and reintubation.  It is proving more difficult to liberate her from the vent, will need LTAC in the interim  Critical care time:    Simonne Martinet ACNP-BC [facility] Pulmonary/Critical Care Pager # [phone] OR # [phone] if no answer    02/03/2023, 7:21 AM

## 2023-02-03 NOTE — Progress Notes (Signed)
RT NOTE:  Pt trach sutures removed with no complications.

## 2023-02-03 NOTE — Progress Notes (Signed)
PT Cancellation Note  Patient Details Name: [name] MRN: [id number] DOB: 1956/07/14   Cancelled Treatment:     pt medicated and scheduled to go to Radiation at 1pm.   Will attempt to see another day as schedule permits.   Felecia Shelling  PTA Acute  Rehabilitation Services Office M-F          [phone]

## 2023-02-04 ENCOUNTER — Inpatient Hospital Stay (HOSPITAL_COMMUNITY): Payer: 59

## 2023-02-04 DIAGNOSIS — J9601 Acute respiratory failure with hypoxia: Secondary | ICD-10-CM | POA: Diagnosis not present

## 2023-02-04 LAB — GLUCOSE, CAPILLARY
Glucose-Capillary: 101 mg/dL — ABNORMAL HIGH (ref 70–99)
Glucose-Capillary: 106 mg/dL — ABNORMAL HIGH (ref 70–99)
Glucose-Capillary: 117 mg/dL — ABNORMAL HIGH (ref 70–99)
Glucose-Capillary: 124 mg/dL — ABNORMAL HIGH (ref 70–99)
Glucose-Capillary: 132 mg/dL — ABNORMAL HIGH (ref 70–99)
Glucose-Capillary: 134 mg/dL — ABNORMAL HIGH (ref 70–99)

## 2023-02-04 MED ORDER — MIDODRINE HCL 5 MG PO TABS
10.0000 mg | ORAL_TABLET | Freq: Three times a day (TID) | ORAL | Status: DC
  Administered 2023-02-04 – 2023-02-11 (×23): 10 mg
  Filled 2023-02-04 (×23): qty 2

## 2023-02-04 MED ORDER — CLONAZEPAM 0.5 MG PO TBDP
2.0000 mg | ORAL_TABLET | Freq: Three times a day (TID) | ORAL | Status: DC
  Administered 2023-02-04 – 2023-02-15 (×36): 2 mg
  Filled 2023-02-04 (×36): qty 4

## 2023-02-04 NOTE — Progress Notes (Signed)
   02/04/23 1300  Vent Select  Invasive or Noninvasive Invasive  Adult Vent Y  Tracheostomy Shiley Flexible 6 mm Cuffed  Placement Date/Time: 01/27/23 1400   Inserted prior to hospital arrival?: Other (Comment)  Placed By: ICU physician  Brand: Shiley Flexible  Size (mm): 6 mm  Style: Cuffed  Status Secured with trach ties  Adult Ventilator Settings  Vent Type Servo i  Humidity HME  Vent Mode (S)  SIMV;PRVC;PSV (Pt completed 3 hrs. ATC 50%, changed back to rest mode due to fatigue.)  Vt Set 510 mL  Set Rate 10 bmp  FiO2 (%) 40 %  Pressure Support 10 cmH20  PEEP 8 cmH20  Adult Ventilator Measurements  Peak Airway Pressure 19 L/min  Mean Airway Pressure 12 cmH20  Resp Rate Spontaneous 20 br/min  Resp Rate Total 30 br/min  Exhaled Vt 425 mL  Measured Ve 13.5 L  Total PEEP 8 cmH20  SpO2 94 %  Adult Ventilator Alarms  Alarms On Y  Ve High Alarm 20 L/min  Ve Low Alarm 4 L/min  Resp Rate High Alarm 40 br/min  Resp Rate Low Alarm 8  PEEP Low Alarm 3 cmH2O  Press High Alarm 45 cmH2O  T Apnea 20 sec(s)  VAP Prevention  HOB> 30 Degrees Y  Breath Sounds  Bilateral Breath Sounds Diminished;Fine crackles  Vent Respiratory Assessment  Level of Consciousness Alert  Patient Tolerance Tolerated well  Suction Method  Respiratory Interventions Airway suction  Airway Suctioning/Secretions  Suction Type Tracheal  Suction Device  Catheter  Secretion Amount Small  Secretion Color Tan  Secretion Consistency Thin  Suction Tolerance Tolerated well  Suctioning Adverse Effects None

## 2023-02-04 NOTE — Progress Notes (Signed)
   02/04/23 1800  Adult Ventilator Settings  Vent Mode (S)  SIMV;PSV;PRVC (RN changed to previous mode due to increased WOB.  Pt completed approx. 4 hrs. PSV 12/8 today, along with 3 hrs. ATC 50%. Total of 7 hrs wean.)

## 2023-02-04 NOTE — Progress Notes (Signed)
   02/04/23 1419  Vent Select  Invasive or Noninvasive Invasive  Adult Vent Y  Adult Ventilator Settings  Vent Type Servo i  Humidity HME  Vent Mode (S)  PSV;CPAP  FiO2 (%) 40 %  Pressure Support (S)  12 cmH20 (PSV trials as tolerated.)  PEEP (S)  8 cmH20  Adult Ventilator Measurements  Peak Airway Pressure 20 L/min  Mean Airway Pressure 12 cmH20  Resp Rate Spontaneous 27 br/min  Resp Rate Total 27 br/min  Exhaled Vt 409 mL  Measured Ve 10.2 L  Total PEEP 8 cmH20  SpO2 97 %  Adult Ventilator Alarms  Alarms On Y  Ve High Alarm 20 L/min  Ve Low Alarm 4 L/min  Resp Rate High Alarm 30 br/min  Resp Rate Low Alarm 8  PEEP Low Alarm 3 cmH2O  Press High Alarm 45 cmH2O  T Apnea 20 sec(s)

## 2023-02-04 NOTE — Progress Notes (Signed)
NAME:  [name], MRN:  [id number], DOB:  Dec 29, 1955, LOS: 23 ADMISSION DATE:  01/11/2023, CONSULTATION DATE:  01/16/2023 REFERRING MD: FMTS, CHIEF COMPLAINT: Acute Respiratory Failure in the setting of Lung Cancer, COPD and infiltrate on CXR   History of Present Illness:  67 year old woman who presented to [facility] 5/1 for nausea/vomiting/diarrhea and abdominal pain x 2 weeks. PMHx significant for COPD, lung CA (adenocarcinoma of LLL, biopsy planned for 5/7), bladder CA (s/p TURBT), GERD, and panic/anxiety. Admitted for AHRF in setting of CAP/COPD exacerbation to FMTS at [facility].   Found to have postobstructive PNA (RML/RLL) and R hilar mass with progressive respiratory failure requiring intubation 5/6. Patient also experienced panic attacks/anxiety leading to exacerbated WOB and VCD with associated stridor. Of note, patient is a DNR; however she endorses that she is not ready to die and wanted to be intubated. (See Prior Note documentation 01/16/2023). Transferred to [facility] 5/7 for ongoing oncological care.  Pertinent Medical History:   Past Medical History:  Diagnosis Date   Abnormal CT lung screening 07/10/2020   Anxiety    Anxiety state 02/28/2012   GAD7 : 6  PHQ9 : 6   Arthritis    Bladder cancer (HCC) 09/2020   urologist--- dr Berneice Heinrich, incidental finding on PET scan 12/ 2021,  s/p TURBT 09-18-2020 high grade papillary urothelial    Cancer related pain 03/11/2022   CAP (community acquired pneumonia) 04/20/2022   Centrilobular emphysema (HCC)    Chronic left shoulder pain 04/29/2021   Close exposure to COVID-19 virus 05/03/2019   Constipation 04/09/2019   Costochondritis, acute 08/14/2020   DDD (degenerative disc disease), lumbar    Depression    DOE (dyspnea on exertion)    10-29-2020  per pt can do house work without sob, when walks/ stairs stops frequently  (stated recovers quickly when stops)   Emphysema lung (HCC)    Epidermoid cyst 10/29/2018   Recurrent. Located on left hip at iliac  crest 2" anterior to midaxillary line. Removed 2013, 2020, 2022.    Epigastric pain 11/16/2020   Fibroid 2003   GERD (gastroesophageal reflux disease)    History of radiation therapy    Left lung- 10/06/20-10/16/20- Dr. Antony Blackbird   Leg cramping 11/13/2019   Lumbar spine pain 01/09/2015   Menopausal vaginal dryness 11/13/2019   Multiple closed fractures of ribs of left side 08/20/2022   Nocturnal enuresis 06/23/2020   Chronic overnight "leaking" of unknown source. Likely urine, but reports no color or odor. Volume enough for liners or light pads. Occurs every night.    Primary adenocarcinoma of lower lobe of left lung (HCC) 07/2020   oncologist--- dr Arbutus Ped---  dx 11/ 2021 bilateral lower lobe masses,  s/p bronchoscopy w/ bx's 07-28-2020 malignancy cells on left ;  completed SBRT bilateral lower lung 10-16-2020 5 fractions   Radiation-induced dermatitis    on 10-29-2020 pt complaint stomach and chest areas,  completed SBRT 10-16-2020   Seasonal allergies    Stage 3 severe COPD by GOLD classification [facility])    pulmonologist--- dr Tonia Brooms,  using anoro inhaler daily   Wears glasses    Significant Hospital Events: Including procedures, antibiotic start and stop dates in addition to other pertinent events   01/16/2023 Transferred to ICU for acute Respiratory Failure , intubated  5/7 Bronch by Icard. significant extrinsic compression of the airways in the right lower lobe and middle lobe. This is likely malignant obstruction. Bx sent. Transferred to [facility] for rad/onc eval. PICC placed 5/8 Stable still  high PEEP/FiO2 needs. Rad/onc consult requested  5/10 Tolerating SBT trial this AM on low-dose fentanyl and Precedex.  Propofol DC'd due to high triglycerides 5/11 Tolerates SBT's, pressure support 10/5 5/12 Off of pressors. Remains on vent, sedated with Precedex/Fentanyl. PMT consulted. 5/13 Weaning on PSV 14/8. > 10L positive over the course of admission. Diuresing. 5/14 Weaning on PSV. Ongoing  diuresis. 5/15 Tolerating SBT, PSV 5/5, following commands. Continue diuresis, Foley placed for persistent urinary retention. Extubated, decompensated quickly after requiring reintubation. DHT placed. 5/16 Agitated overnight, c/o throat pain with ETT, RASS 1-2 despite sedation. Failed extubation 5/15. Tolerating SBT PSV 10/5, FiO2 35%. ~1630 desaturated with inability to pass Ballard suction/difficulty bagging; urgent tube exchange completed with bougie and blood clot removed with improvement in oxygenation/ventilation. 5/17 Tracheostomy placed 5/19 Back in atrial fibrillation in am, HR 150s 5/20 weaned on pressure support 10/5 5/21 desaturated during weaning. Still on dex and fent gtt 5/23 added fent patch and seroquel had to have NE gtt and midodrine started for drug related hypotension 5/24 trialing fent gtt off. Foley removed  Interim History / Subjective:  Sedated this am   Objective:  Blood pressure (!) 96/56, pulse 85, temperature 98.4 F (36.9 C), temperature source Axillary, resp. rate (!) 27, height 5\' 8"  (1.727 m), weight 80.6 kg, SpO2 97 %.    Vent Mode: SIMV;PRVC;PSV FiO2 (%):  [40 %] 40 % Set Rate:  [10 bmp] 10 bmp Vt Set:  [510 mL] 510 mL PEEP:  [8 cmH20] 8 cmH20 Pressure Support:  [10 cmH20] 10 cmH20 Plateau Pressure:  [15 cmH20-22 cmH20] 19 cmH20   Intake/Output Summary (Last 24 hours) at 02/04/2023 0931 Last data filed at 02/04/2023 1610 Gross per 24 hour  Intake 1792.93 ml  Output 700 ml  Net 1092.93 ml    Filed Weights   02/02/23 0500 02/03/23 0712 02/04/23 0500  Weight: 80.5 kg 80.6 kg 80.6 kg   Physical Examination: General chronically ill 67 year old female a little more sedated this am  HENT NCAT trach is 6 cuffed. Midline unremarkable Pulm coarse breath sounds Card rrr Abd soft Ext warm and dry had dependent edema  Neuro/psych more sleepy today (still on fent gtt) moves all ext. Follows commands. 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 -- Norepinephrine as needed, MAP goal greater than 65 -- midodrine per tube --diuresis holiday   Atrial fibrillation: Rate reasonably controlled -- Continue Eliquis, add rate control if needed   Hyperglycemia: -- Sliding scale insulin   Urinary retention: -- Continue bethanechol, now again with retention after Foley removal 5/24, replaced 5/25  Best Practice: (right click and "Reselect all SmartList Selections" daily)   Diet/type: tubefeeds DVT prophylaxis: Eliquis  twice daily GI prophylaxis: H2B Lines: Central line - PICC Foley:  Yes Code Status:  full code Last date of multidisciplinary goals of care discussion: Updated patient extensively 5/25 discussed limitation of ventilator dependence from liberating from the ICU and thus going home discussed likely take weeks of rehab before she could return home  Critical care time:    CRITICAL CARE Performed by: Karren Burly   Total critical care time: 40 minutes  Critical care time was exclusive of separately billable procedures and treating other  patients.  Critical care was necessary to treat or prevent imminent or life-threatening deterioration.  Critical care was time spent personally by me on the following activities: development of treatment plan with patient and/or surrogate as well as nursing, discussions with consultants, evaluation of patient's response to treatment, examination of patient, obtaining history from patient or surrogate, ordering and performing treatments and interventions, ordering and review of laboratory studies, ordering and review of radiographic studies, pulse oximetry and re-evaluation of patient's condition.   Karren Burly, MD [facility] Pulmonary/Critical Care See Amion for contact info OR # [phone] if no answer  02/04/2023, 9:31 AM

## 2023-02-04 NOTE — Progress Notes (Signed)
   02/04/23 1040  Adult Ventilator Settings  Vent Mode (S)  Stand-by (ATC increased to 50%, suctioned small, orange, tan amts of secretions, encouraged pt to try at least 2 hrs. ATC, family at bedside, RN, will continue to monitor.)

## 2023-02-04 NOTE — Progress Notes (Signed)
   02/04/23 0955  Adult Ventilator Settings  Vent Mode (S)  Stand-by (Placed pt on 40% ATC, tolerating well, RR 24, Sp02 93%. Wean as tolerated.)

## 2023-02-04 NOTE — Progress Notes (Signed)
   02/04/23 1147  Vent Select  Adult Vent (S)   (standby: Pt is tolerating ATC 50%, Sp02 91, RR 24, continue as tolerated.)  Tracheostomy Shiley Flexible 6 mm Cuffed  Placement Date/Time: 01/27/23 1400   Inserted prior to hospital arrival?: Other (Comment)  Placed By: ICU physician  Brand: Shiley Flexible  Size (mm): 6 mm  Style: Cuffed  Status Secured with trach ties  Site Care Other (Comment) (clean site.)  Inner Cannula Care Other (Comment) (patent)  Ties Assessment Clean, Dry  Tracheostomy Equipment at bedside Yes and checklist posted at head of bed

## 2023-02-05 DIAGNOSIS — J9601 Acute respiratory failure with hypoxia: Secondary | ICD-10-CM | POA: Diagnosis not present

## 2023-02-05 LAB — BASIC METABOLIC PANEL
Anion gap: 9 (ref 5–15)
BUN: 35 mg/dL — ABNORMAL HIGH (ref 8–23)
CO2: 30 mmol/L (ref 22–32)
Calcium: 8.9 mg/dL (ref 8.9–10.3)
Chloride: 100 mmol/L (ref 98–111)
Creatinine, Ser: 0.92 mg/dL (ref 0.44–1.00)
GFR, Estimated: >60 mL/min (ref >60)
Glucose, Bld: 133 mg/dL — ABNORMAL HIGH (ref 70–99)
Potassium: 4.3 mmol/L (ref 3.5–5.1)
Sodium: 139 mmol/L (ref 135–145)

## 2023-02-05 LAB — GLUCOSE, CAPILLARY
Glucose-Capillary: 116 mg/dL — ABNORMAL HIGH (ref 70–99)
Glucose-Capillary: 123 mg/dL — ABNORMAL HIGH (ref 70–99)
Glucose-Capillary: 149 mg/dL — ABNORMAL HIGH (ref 70–99)
Glucose-Capillary: 149 mg/dL — ABNORMAL HIGH (ref 70–99)
Glucose-Capillary: 97 mg/dL (ref 70–99)
Glucose-Capillary: 98 mg/dL (ref 70–99)
Glucose-Capillary: 99 mg/dL (ref 70–99)

## 2023-02-05 MED ORDER — MIDAZOLAM HCL 2 MG/2ML IJ SOLN
2.0000 mg | Freq: Once | INTRAMUSCULAR | Status: AC
  Administered 2023-02-05: 2 mg via INTRAVENOUS

## 2023-02-05 MED ORDER — POLYETHYLENE GLYCOL 3350 17 G PO PACK
17.0000 g | PACK | Freq: Two times a day (BID) | ORAL | Status: DC
  Administered 2023-02-05 – 2023-02-06 (×2): 17 g
  Filled 2023-02-05 (×3): qty 1

## 2023-02-05 MED ORDER — SENNOSIDES 8.8 MG/5ML PO SYRP
10.0000 mL | ORAL_SOLUTION | Freq: Two times a day (BID) | ORAL | Status: DC
  Administered 2023-02-05 – 2023-02-07 (×3): 10 mL
  Filled 2023-02-05 (×7): qty 10

## 2023-02-05 NOTE — Progress Notes (Signed)
Pt completed 2.5 hrs. ATC 50%.  Placed back on full support by RN.

## 2023-02-05 NOTE — Progress Notes (Signed)
   02/05/23 0839  Vent Select  Invasive or Noninvasive  (standby, placed pt on 50% ATC, SPO2 96%, as tolerated.)

## 2023-02-05 NOTE — Progress Notes (Signed)
eLink Physician-Brief Progress Note Patient Name: [name] DOB: Feb 02, 1956 MRN: [id number]   Date of Service  02/05/2023  HPI/Events of Note  Camera to room for agitation, anxiety, trying to get up. Tachycardic, sats 100% on 510/8. PIP 19.  Has Trach. No rhonchi. On precedex maxed at 0.6  Discussed with RN. No secretions. Called RT to bed side.   eICU Interventions  Stat Versed 2 mg, titrate up precedex for agiation, anxiety. -bilateral soft wrist restraints ordered to prevent self injury and harm. If not better, to call back for CxR, ABG.      Intervention Category Major Interventions: Respiratory failure - evaluation and management;Other:  Ranee Gosselin 02/05/2023, 2:40 AM

## 2023-02-05 NOTE — Progress Notes (Signed)
   02/05/23 1621  Adult Ventilator Settings  Vent Type Servo i  Humidity HME  Vent Mode PRVC (Ended vent wean due to fatigue, SOB. RN changed back to previous mode.  Total wean: 3 hrs. PSV 12/8, 40% and ATC 50% x 2.5 hrs, total wean 5.5 hrs.)  Vt Set 510 mL  Set Rate 10 bmp  FiO2 (%) 40 %  Pressure Support 10 cmH20  PEEP 8 cmH20

## 2023-02-05 NOTE — Progress Notes (Signed)
   02/05/23 0259  Vent Select  Invasive or Noninvasive Invasive  Adult Vent Y  Tracheostomy Shiley Flexible 6 mm Cuffed  Placement Date/Time: 01/27/23 1400   Inserted prior to hospital arrival?: Other (Comment)  Placed By: ICU physician  Brand: Shiley Flexible  Size (mm): 6 mm  Style: Cuffed  Status Secured with trach ties  Site Assessment Oozing secretions  Site Care Cleansed;Dried;Dressing applied (removed mepilex on the lower part of the trach)  Ties Assessment Clean, Dry  Cuff Pressure (cm H2O) Green OR 18-26 CmH2O  Tracheostomy Equipment at bedside Yes and checklist posted at head of bed  Adult Ventilator Settings  Vent Type Servo i  Humidity HME  Vent Mode SIMV;PRVC;PSV  Vt Set 510 mL  Set Rate 10 bmp  FiO2 (%) 40 %  I Time 0.9 Sec(s)  PEEP 8 cmH20  Adult Ventilator Measurements  Peak Airway Pressure 19 L/min  Mean Airway Pressure 16 cmH20  Plateau Pressure 20 cmH20  Resp Rate Spontaneous 20 br/min  Resp Rate Total 30 br/min  Exhaled Vt 682 mL  Measured Ve 15 L  Auto PEEP 0 cmH20  Total PEEP 8 cmH20  SpO2 97 %  Adult Ventilator Alarms  Alarms On Y  VAP Prevention  HOB> 30 Degrees Y  Daily Weaning Assessment  Daily Assessment of Readiness to Wean Wean protocol criteria met (SBT performed)  Breath Sounds  Bilateral Breath Sounds Diminished  R Upper  Breath Sounds Diminished  L Upper Breath Sounds Diminished  R Lower Breath Sounds Diminished  L Lower Breath Sounds Diminished  Vent Respiratory Assessment  Respiratory Pattern Regular  Airway Suctioning/Secretions  Suction Type Tracheal  Suction Device  Catheter  Secretion Amount Small  Secretion Color Yellow;Tan  Secretion Consistency Thick  Suction Tolerance Tolerated well  Suctioning Adverse Effects None   RT was called in the room to make sure that the trach is still in place. RN said pt may have pulled on it. Trach in place, good vol, suctioned pt, Removed mepilex from under the trach and dressing placed.  Pt is doing well at this time, No resp distress noted, VS Wnl.

## 2023-02-05 NOTE — Progress Notes (Signed)
NAME:  [name], MRN:  [id number], DOB:  May 31, 1956, LOS: 24 ADMISSION DATE:  01/11/2023, CONSULTATION DATE:  01/16/2023 REFERRING MD: FMTS, CHIEF COMPLAINT: Acute Respiratory Failure in the setting of Lung Cancer, COPD and infiltrate on CXR   History of Present Illness:  67 year old woman who presented to [facility] 5/1 for nausea/vomiting/diarrhea and abdominal pain x 2 weeks. PMHx significant for COPD, lung CA (adenocarcinoma of LLL, biopsy planned for 5/7), bladder CA (s/p TURBT), GERD, and panic/anxiety. Admitted for AHRF in setting of CAP/COPD exacerbation to FMTS at [facility].   Found to have postobstructive PNA (RML/RLL) and R hilar mass with progressive respiratory failure requiring intubation 5/6. Patient also experienced panic attacks/anxiety leading to exacerbated WOB and VCD with associated stridor. Of note, patient is a DNR; however she endorses that she is not ready to die and wanted to be intubated. (See Prior Note documentation 01/16/2023). Transferred to [facility] 5/7 for ongoing oncological care.  Pertinent Medical History:   Past Medical History:  Diagnosis Date   Abnormal CT lung screening 07/10/2020   Anxiety    Anxiety state 02/28/2012   GAD7 : 6  PHQ9 : 6   Arthritis    Bladder cancer (HCC) 09/2020   urologist--- dr Berneice Heinrich, incidental finding on PET scan 12/ 2021,  s/p TURBT 09-18-2020 high grade papillary urothelial    Cancer related pain 03/11/2022   CAP (community acquired pneumonia) 04/20/2022   Centrilobular emphysema (HCC)    Chronic left shoulder pain 04/29/2021   Close exposure to COVID-19 virus 05/03/2019   Constipation 04/09/2019   Costochondritis, acute 08/14/2020   DDD (degenerative disc disease), lumbar    Depression    DOE (dyspnea on exertion)    10-29-2020  per pt can do house work without sob, when walks/ stairs stops frequently  (stated recovers quickly when stops)   Emphysema lung (HCC)    Epidermoid cyst 10/29/2018   Recurrent. Located on left hip at iliac  crest 2" anterior to midaxillary line. Removed 2013, 2020, 2022.    Epigastric pain 11/16/2020   Fibroid 2003   GERD (gastroesophageal reflux disease)    History of radiation therapy    Left lung- 10/06/20-10/16/20- Dr. Antony Blackbird   Leg cramping 11/13/2019   Lumbar spine pain 01/09/2015   Menopausal vaginal dryness 11/13/2019   Multiple closed fractures of ribs of left side 08/20/2022   Nocturnal enuresis 06/23/2020   Chronic overnight "leaking" of unknown source. Likely urine, but reports no color or odor. Volume enough for liners or light pads. Occurs every night.    Primary adenocarcinoma of lower lobe of left lung (HCC) 07/2020   oncologist--- dr Arbutus Ped---  dx 11/ 2021 bilateral lower lobe masses,  s/p bronchoscopy w/ bx's 07-28-2020 malignancy cells on left ;  completed SBRT bilateral lower lung 10-16-2020 5 fractions   Radiation-induced dermatitis    on 10-29-2020 pt complaint stomach and chest areas,  completed SBRT 10-16-2020   Seasonal allergies    Stage 3 severe COPD by GOLD classification [facility])    pulmonologist--- dr Tonia Brooms,  using anoro inhaler daily   Wears glasses    Significant Hospital Events: Including procedures, antibiotic start and stop dates in addition to other pertinent events   01/16/2023 Transferred to ICU for acute Respiratory Failure , intubated  5/7 Bronch by Icard. significant extrinsic compression of the airways in the right lower lobe and middle lobe. This is likely malignant obstruction. Bx sent. Transferred to [facility] for rad/onc eval. PICC placed 5/8 Stable still  high PEEP/FiO2 needs. Rad/onc consult requested  5/10 Tolerating SBT trial this AM on low-dose fentanyl and Precedex.  Propofol DC'd due to high triglycerides 5/11 Tolerates SBT's, pressure support 10/5 5/12 Off of pressors. Remains on vent, sedated with Precedex/Fentanyl. PMT consulted. 5/13 Weaning on PSV 14/8. > 10L positive over the course of admission. Diuresing. 5/14 Weaning on PSV. Ongoing  diuresis. 5/15 Tolerating SBT, PSV 5/5, following commands. Continue diuresis, Foley placed for persistent urinary retention. Extubated, decompensated quickly after requiring reintubation. DHT placed. 5/16 Agitated overnight, c/o throat pain with ETT, RASS 1-2 despite sedation. Failed extubation 5/15. Tolerating SBT PSV 10/5, FiO2 35%. ~1630 desaturated with inability to pass Ballard suction/difficulty bagging; urgent tube exchange completed with bougie and blood clot removed with improvement in oxygenation/ventilation. 5/17 Tracheostomy placed 5/19 Back in atrial fibrillation in am, HR 150s 5/20 weaned on pressure support 10/5 5/21 desaturated during weaning. Still on dex and fent gtt 5/23 added fent patch and seroquel had to have NE gtt and midodrine started for drug related hypotension 5/24 trialing fent gtt off. Foley removed 5/25 3 hrs. trach collar  Interim History / Subjective:  Did 3 hours trach collar yesterday.  Need to wean off Precedex.  Was increased overnight.  Anxiety remains a major barrier.  Objective:  Blood pressure 135/79, pulse (!) 116, temperature 99 F (37.2 C), temperature source Axillary, resp. rate 19, height 5\' 8"  (1.727 m), weight 80.7 kg, SpO2 97 %.    Vent Mode: SIMV;PSV;PRVC FiO2 (%):  [40 %] 40 % Set Rate:  [10 bmp] 10 bmp Vt Set:  [510 mL] 510 mL PEEP:  [8 cmH20] 8 cmH20 Pressure Support:  [10 cmH20-12 cmH20] 12 cmH20 Plateau Pressure:  [14 cmH20-20 cmH20] 20 cmH20   Intake/Output Summary (Last 24 hours) at 02/05/2023 0927 Last data filed at 02/05/2023 0913 Gross per 24 hour  Intake 30 ml  Output 1750 ml  Net -1720 ml    Filed Weights   02/03/23 0712 02/04/23 0500 02/05/23 0500  Weight: 80.6 kg 80.6 kg 80.7 kg   Physical Examination: General chronically ill 67 year old female more alert than prior HENT NCAT trach is 6 cuffed. Midline unremarkable Pulm coarse breath sounds Card rrr Abd soft Ext warm and dry had dependent edema  Neuro/psych   Follows commands. 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 -- Norepinephrine as needed, MAP goal greater than 65 -- midodrine per tube --diuresis holiday   Atrial fibrillation: Rate reasonably controlled -- Continue Eliquis, add rate control if needed   Hyperglycemia: -- Sliding scale insulin   Urinary retention: -- Continue bethanechol, now again with retention after Foley removal 5/24, replaced  5/25  Constipation: With previous loose stools.  Suspect opiate related. -- MiraLAX and senna twice daily 5/26  Best Practice: (right click and "Reselect all SmartList Selections" daily)   Diet/type: tubefeeds DVT prophylaxis: Eliquis twice daily GI prophylaxis: H2B Lines: Central line - PICC Foley:  Yes Code Status:  full code Last date of multidisciplinary goals of care discussion: Updated patient extensively 5/25 discussed limitation of ventilator dependence from liberating from the ICU and thus going home discussed likely take weeks of rehab before she could return  home  Critical care time:    CRITICAL CARE Performed by: Karren Burly   Total critical care time: 32 minutes  Critical care time was exclusive of separately billable procedures and treating other patients.  Critical care was necessary to treat or prevent imminent or life-threatening deterioration.  Critical care was time spent personally by me on the following activities: development of treatment plan with patient and/or surrogate as well as nursing, discussions with consultants, evaluation of patient's response to treatment, examination of patient, obtaining history from patient or surrogate, ordering and performing treatments and interventions, ordering and review of laboratory studies, ordering and review of radiographic studies, pulse oximetry and re-evaluation of patient's condition.   Karren Burly, MD [facility] Pulmonary/Critical Care See Amion for contact info OR # [phone] if no answer  02/05/2023, 9:27 AM

## 2023-02-05 NOTE — Progress Notes (Signed)
   02/05/23 1327  Vent Select  Invasive or Noninvasive Invasive  Adult Vent Y  Tracheostomy Shiley Flexible 6 mm Cuffed  Placement Date/Time: 01/27/23 1400   Inserted prior to hospital arrival?: Other (Comment)  Placed By: ICU physician  Brand: Shiley Flexible  Size (mm): 6 mm  Style: Cuffed  Status Secured with trach ties  Adult Ventilator Settings  Vent Type Servo i  Humidity HME  Vent Mode (S)  PSV;CPAP  FiO2 (%) (S)  40 %  Pressure Support (S)  12 cmH20  PEEP (S)  8 cmH20  Adult Ventilator Measurements  Peak Airway Pressure 20 L/min  Mean Airway Pressure 11 cmH20  Resp Rate Spontaneous 17 br/min  Resp Rate Total 17 br/min  Spont TV 451 mL  Measured Ve 7.5 L  Total PEEP 8 cmH20  SpO2 97 %  Adult Ventilator Alarms  Alarms On Y  Ve High Alarm 20 L/min  Ve Low Alarm 4 L/min  Resp Rate High Alarm 35 br/min (During wean.)  Resp Rate Low Alarm 8  PEEP Low Alarm 3 cmH2O  Press High Alarm 45 cmH2O  T Apnea 20 sec(s)  VAP Prevention  HOB> 30 Degrees Y   Placed pt on PSV 12/8 as tolerated.

## 2023-02-06 DIAGNOSIS — J9601 Acute respiratory failure with hypoxia: Secondary | ICD-10-CM | POA: Diagnosis not present

## 2023-02-06 LAB — GLUCOSE, CAPILLARY
Glucose-Capillary: 111 mg/dL — ABNORMAL HIGH (ref 70–99)
Glucose-Capillary: 118 mg/dL — ABNORMAL HIGH (ref 70–99)
Glucose-Capillary: 146 mg/dL — ABNORMAL HIGH (ref 70–99)
Glucose-Capillary: 137 mg/dL — ABNORMAL HIGH (ref 70–99)
Glucose-Capillary: 126 mg/dL — ABNORMAL HIGH (ref 70–99)
Glucose-Capillary: 141 mg/dL — ABNORMAL HIGH (ref 70–99)

## 2023-02-06 NOTE — Evaluation (Signed)
[facility]y-[facility] Speaking Valve - Evaluation Patient Details  Name: ANNESIA AILLS MRN: [id number] Date of Birth: [birth year]  Today's Date: 02/06/2023, 67 Time: 0945-1000 SLP Time Calculation (min) (ACUTE ONLY): 15 min  Past Medical History:  Past Medical History:  Diagnosis Date   Abnormal CT lung screening 07/10/2020   Anxiety    Anxiety state 02/28/2012   GAD7 : 6  PHQ9 : 6   Arthritis    Bladder cancer (HCC) 09/2020   urologist--- dr Berneice Heinrich, incidental finding on PET scan 12/ 2021,  s/p TURBT 09-18-2020 high grade papillary urothelial    Cancer related pain 03/11/2022   CAP (community acquired pneumonia) 04/20/2022   Centrilobular emphysema (HCC)    Chronic left shoulder pain 04/29/2021   Close exposure to COVID-19 virus 05/03/2019   Constipation 04/09/2019   Costochondritis, acute 08/14/2020   DDD (degenerative disc disease), lumbar    Depression    DOE (dyspnea on exertion)    10-29-2020  per pt can do house work without sob, when walks/ stairs stops frequently  (stated recovers quickly when stops)   Emphysema lung (HCC)    Epidermoid cyst 10/29/2018   Recurrent. Located on left hip at iliac crest 2" anterior to midaxillary line. Removed 2013, 2020, 2022.    Epigastric pain 11/16/2020   Fibroid 2003   GERD (gastroesophageal reflux disease)    History of radiation therapy    Left lung- 10/06/20-10/16/20- Dr. Antony Blackbird   Leg cramping 11/13/2019   Lumbar spine pain 01/09/2015   Menopausal vaginal dryness 11/13/2019   Multiple closed fractures of ribs of left side 08/20/2022   Nocturnal enuresis 06/23/2020   Chronic overnight "leaking" of unknown source. Likely urine, but reports no color or odor. Volume enough for liners or light pads. Occurs every night.    Primary adenocarcinoma of lower lobe of left lung (HCC) 07/2020   oncologist--- dr Arbutus Ped---  dx 11/ 2021 bilateral lower lobe masses,  s/p bronchoscopy w/ bx's 07-28-2020 malignancy cells on left ;  completed SBRT  bilateral lower lung 10-16-2020 5 fractions   Radiation-induced dermatitis    on 10-29-2020 pt complaint stomach and chest areas,  completed SBRT 10-16-2020   Seasonal allergies    Stage 3 severe COPD by GOLD classification [facility])    pulmonologist--- dr Tonia Brooms,  using anoro inhaler daily   Wears glasses    Past Surgical History:  Past Surgical History:  Procedure Laterality Date   ANTERIOR CERVICAL DECOMP/DISCECTOMY FUSION  03-08-2003 @ [facility]   C3 --- C6   ARTHROTOMY Right 11/09/2015   Procedure: RIGHT WRIST DISTAL RADIAL ULNAR JOINT ARTHROTOMY AND DEBRIDEMENT,  AND ;  Surgeon: Bradly Bienenstock, MD;  [facility];  Service: Orthopedics;  Laterality: Right;   BRONCHIAL BIOPSY  07/28/2020   Procedure: BRONCHIAL BIOPSIES;  Surgeon: Josephine Igo, DO;  Location: [facility];  Service: Pulmonary;;   BRONCHIAL BIOPSY  08/17/2021   Procedure: BRONCHIAL BIOPSIES;  Surgeon: Josephine Igo, DO;  Location: [facility];  Service: Pulmonary;;   BRONCHIAL BIOPSY  01/17/2023   Procedure: BRONCHIAL BIOPSIES;  Surgeon: Josephine Igo, DO;  Location: [facility];  Service: Cardiopulmonary;;   BRONCHIAL BRUSHINGS  07/28/2020   Procedure: BRONCHIAL BRUSHINGS;  Surgeon: Josephine Igo, DO;  Location: [facility];  Service: Pulmonary;;   BRONCHIAL BRUSHINGS  08/17/2021   Procedure: BRONCHIAL BRUSHINGS;  Surgeon: Josephine Igo, DO;  [facility]: [facility];  Service: Pulmonary;;   BRONCHIAL NEEDLE ASPIRATION BIOPSY  07/28/2020   Procedure: BRONCHIAL NEEDLE ASPIRATION BIOPSIES;  Surgeon: Josephine Igo, DO;  [facility]: [facility];  Service: Pulmonary;;   BRONCHIAL NEEDLE ASPIRATION BIOPSY  01/17/2023   Procedure: BRONCHIAL NEEDLE ASPIRATION BIOPSIES;  Surgeon: Josephine Igo, DO;  Location: [facility];  Service: Cardiopulmonary;;   BRONCHIAL WASHINGS  07/28/2020   Procedure: BRONCHIAL WASHINGS;  Surgeon: Josephine Igo, DO;  [facility]: [facility];  Service: Pulmonary;;   BRONCHIAL WASHINGS   08/17/2021   Procedure: BRONCHIAL WASHINGS;  Surgeon: Josephine Igo, DO;  Location: [facility];  Service: Pulmonary;;   COLONOSCOPY     CYSTOSCOPY WITH RETROGRADE PYELOGRAM, URETEROSCOPY AND STENT PLACEMENT N/A 09/18/2020   Procedure: CYSTOSCOPY WITH RETROGRADE PYELOGRAM bilateral,URETEROSCOPY AND  STENT PLACEMENT right ureter;  Surgeon: Sebastian Ache, MD;  Location: [facility];  Service: Urology;  Laterality: N/A;  1 HR   HARDWARE REMOVAL Right 07/24/2013   Procedure: RIGHT WRIST HARDWARE REMOVAL, JOINT RELEASE, RIGHT HAND MANIPULATION UNDER ANESTHESIA;  Surgeon: Sharma Covert, MD;  Location: [facility];  Service: Orthopedics;  Laterality: Right;   MASS EXCISION Left 10/14/2021   Procedure: EXCISION LEFT BACK CYSTIC MASS;  Surgeon: Sheliah Hatch, De Blanch, MD;  Location: [facility] [place];  Service: General;  Laterality: Left;   OPEN REDUCTION INTERNAL FIXATION (ORIF) DISTAL RADIAL FRACTURE Right 11/08/2012   Procedure: OPEN REDUCTION INTERNAL FIXATION (ORIF) RIGHT DISTAL RADIUS FRACTURE;  Surgeon: Sharma Covert, MD;  Location: [facility];  Service: Orthopedics;  Laterality: Right;   TRANSURETHRAL RESECTION OF BLADDER TUMOR N/A 09/18/2020   Procedure: TRANSURETHRAL RESECTION OF BLADDER TUMOR (TURBT);  Surgeon: Sebastian Ache, MD;  Location: [facility];  Service: Urology;  Laterality: N/A;   TRANSURETHRAL RESECTION OF BLADDER TUMOR N/A 11/04/2020   Procedure: RESTAGING TRANSURETHRAL RESECTION OF BLADDER TUMOR (TURBT); BILATERAL RETROGRADE PYELOGRAM;  Surgeon: Sebastian Ache, MD;  Location: [facility];  Service: Urology;  Laterality: N/A;  1 HR   VIDEO BRONCHOSCOPY  01/17/2023   Procedure: VIDEO BRONCHOSCOPY WITHOUT FLUORO;  Surgeon: Josephine Igo, DO;  Location: [facility];  Service: Cardiopulmonary;;   VIDEO BRONCHOSCOPY WITH ENDOBRONCHIAL NAVIGATION N/A 07/28/2020   Procedure: VIDEO BRONCHOSCOPY WITH ENDOBRONCHIAL NAVIGATION;  Surgeon: Josephine Igo, DO;  [facility];  Service: Pulmonary;  Laterality: N/A;   VIDEO BRONCHOSCOPY WITH RADIAL ENDOBRONCHIAL ULTRASOUND  08/17/2021   Procedure: VIDEO BRONCHOSCOPY WITH RADIAL ENDOBRONCHIAL ULTRASOUND;  Surgeon: Josephine Igo, DO;  Location: [facility];  Service: Pulmonary;;   WRIST ARTHROPLASTY Right 11/09/2015   Procedure: OR DISTAL ULNAR RESECTION ARTHROPLASTY ;  Surgeon: Bradly Bienenstock, MD;  Location: [facility];  Service: Orthopedics;  Laterality: Right;   WRIST ARTHROSCOPY WITH DEBRIDEMENT Right 07/21/2014   Procedure: RIGHT WRIST DISTAL RADIAL ULNAR JOINT DEBRIDEMENT AND JOINT RELEASE POSSIBLE TENDON INTERPOSITION;  Surgeon: Sharma Covert, MD;  Location: [facility];  Service: Orthopedics;  Laterality: Right;   HPI:  Patient is a 67 y.o. female with PMH: COPD, recurrent adenocarcinoma of LLL, anxiety, GERD, epigastric pain, depression. She presented to the hospital on 01/11/2023 for nausea/vomiting/diarrhea and abdominal pain x 2 weeks. She was admitted for AHRF in setting of CAP/COPD exacerbation. She was found to have post-obstructive PNA (RML/RLL) and right hilar mass with progressive respiratory failure requiring intubation 5/6. In addition, she experienced panic/anxiety attacks leading to exacerbated WOB and VCD with associated stridor. On 5/15 she was extubated but quickly decomensated and was reintubated. Tracheostomy placed 5/17. She tolerated 3 hours of trach collar on 5/25.    Assessment / Plan / Recommendation  Clinical Impression  Patient was only able to tolerate 2 minutes of cuff deflation and so PMV was not attempted during this assessment. Prior to cuff deflation, patient c/o pain at trach site (RN reports this has been ongoing). RN had just completed tracheal suctioning prior to SLP entering room. SpO2 was in low 90%'s and RR in mid-20's to low 30's. SLP then deflated cuff with patient not exhibiting immediate overt s/s. After several seconds into trach cuff deflation,  patient exhibiting more rapid, shallow breathing and increased WOB but with no significant changes in vitals on monitor. Patient c/o feeling more anxious. SLP then reinflated trach cuff, with patient returning to baseline after several seconds. SLP and RN discussed plan for SLP to return later this date to try cuff deflation and perhaps PMV. SLP Visit Diagnosis: Aphonia (R49.1)    SLP Assessment  Patient needs continued Speech Lanaguage Pathology Services    Recommendations for follow up therapy are one component of a multi-disciplinary discharge planning process, led by the attending physician.  Recommendations may be updated based on patient status, additional functional criteria and insurance authorization.  Follow Up Recommendations  SLP at Long-term acute care hospital    Assistance Recommended at Discharge Frequent or constant Supervision/Assistance  Functional Status Assessment Patient has had a recent decline in their functional status and demonstrates the ability to make significant improvements in function in a reasonable and predictable amount of time.  Frequency and Duration min 2x/week  2 weeks    PMSV Trial     Tracheostomy Tube       Vent Dependency  Vent Mode: SIMV/PC/PS Set Rate: 10 bmp PEEP: 8 cmH20 FiO2 (%): 50 % Vt Set: 510 mL    Cuff Deflation Trial Tolerated Cuff Deflation: No Length of Time for Cuff Deflation Trial: 2 minutes Behavior: Alert;Anxious Cuff Deflation Trial - Comments: SpO2 and RR with no observed significant changes however patient observed to have increased WOB, SOB and increased anxiety during cuff deflation         Angela Nevin, MA, CCC-SLP Speech Therapy

## 2023-02-06 NOTE — Progress Notes (Signed)
RT note: Pt. remains to wean on PS(10)/P(8)-40%, RT plan to place back to full support per order for h/s, RN made aware.

## 2023-02-06 NOTE — Progress Notes (Signed)
NAME:  [name], MRN:  [id number], DOB:  1956-02-10, LOS: 25 ADMISSION DATE:  01/11/2023, CONSULTATION DATE:  01/16/2023 REFERRING MD: FMTS, CHIEF COMPLAINT: Acute Respiratory Failure in the setting of Lung Cancer, COPD and infiltrate on CXR   History of Present Illness:  67 year old woman who presented to [facility] 5/1 for nausea/vomiting/diarrhea and abdominal pain x 2 weeks. PMHx significant for COPD, lung CA (adenocarcinoma of LLL, biopsy planned for 5/7), bladder CA (s/p TURBT), GERD, and panic/anxiety. Admitted for AHRF in setting of CAP/COPD exacerbation to FMTS at [facility].   Found to have postobstructive PNA (RML/RLL) and R hilar mass with progressive respiratory failure requiring intubation 5/6. Patient also experienced panic attacks/anxiety leading to exacerbated WOB and VCD with associated stridor. Of note, patient is a DNR; however she endorses that she is not ready to die and wanted to be intubated. (See Prior Note documentation 01/16/2023). Transferred to [facility] 5/7 for ongoing oncological care.  Pertinent Medical History:   Past Medical History:  Diagnosis Date   Abnormal CT lung screening 07/10/2020   Anxiety    Anxiety state 02/28/2012   GAD7 : 6  PHQ9 : 6   Arthritis    Bladder cancer (HCC) 09/2020   urologist--- dr Berneice Heinrich, incidental finding on PET scan 12/ 2021,  s/p TURBT 09-18-2020 high grade papillary urothelial    Cancer related pain 03/11/2022   CAP (community acquired pneumonia) 04/20/2022   Centrilobular emphysema (HCC)    Chronic left shoulder pain 04/29/2021   Close exposure to COVID-19 virus 05/03/2019   Constipation 04/09/2019   Costochondritis, acute 08/14/2020   DDD (degenerative disc disease), lumbar    Depression    DOE (dyspnea on exertion)    10-29-2020  per pt can do house work without sob, when walks/ stairs stops frequently  (stated recovers quickly when stops)   Emphysema lung (HCC)    Epidermoid cyst 10/29/2018   Recurrent. Located on left hip at iliac  crest 2" anterior to midaxillary line. Removed 2013, 2020, 2022.    Epigastric pain 11/16/2020   Fibroid 2003   GERD (gastroesophageal reflux disease)    History of radiation therapy    Left lung- 10/06/20-10/16/20- Dr. Antony Blackbird   Leg cramping 11/13/2019   Lumbar spine pain 01/09/2015   Menopausal vaginal dryness 11/13/2019   Multiple closed fractures of ribs of left side 08/20/2022   Nocturnal enuresis 06/23/2020   Chronic overnight "leaking" of unknown source. Likely urine, but reports no color or odor. Volume enough for liners or light pads. Occurs every night.    Primary adenocarcinoma of lower lobe of left lung (HCC) 07/2020   oncologist--- dr Arbutus Ped---  dx 11/ 2021 bilateral lower lobe masses,  s/p bronchoscopy w/ bx's 07-28-2020 malignancy cells on left ;  completed SBRT bilateral lower lung 10-16-2020 5 fractions   Radiation-induced dermatitis    on 10-29-2020 pt complaint stomach and chest areas,  completed SBRT 10-16-2020   Seasonal allergies    Stage 3 severe COPD by GOLD classification [facility])    pulmonologist--- dr Tonia Brooms,  using anoro inhaler daily   Wears glasses    Significant Hospital Events: Including procedures, antibiotic start and stop dates in addition to other pertinent events   01/16/2023 Transferred to ICU for acute Respiratory Failure , intubated  5/7 Bronch by Icard. significant extrinsic compression of the airways in the right lower lobe and middle lobe. This is likely malignant obstruction. Bx sent. Transferred to [facility] for rad/onc eval. PICC placed 5/8 Stable still  high PEEP/FiO2 needs. Rad/onc consult requested  5/10 Tolerating SBT trial this AM on low-dose fentanyl and Precedex.  Propofol DC'd due to high triglycerides 5/11 Tolerates SBT's, pressure support 10/5 5/12 Off of pressors. Remains on vent, sedated with Precedex/Fentanyl. PMT consulted. 5/13 Weaning on PSV 14/8. > 10L positive over the course of admission. Diuresing. 5/14 Weaning on PSV. Ongoing  diuresis. 5/15 Tolerating SBT, PSV 5/5, following commands. Continue diuresis, Foley placed for persistent urinary retention. Extubated, decompensated quickly after requiring reintubation. DHT placed. 5/16 Agitated overnight, c/o throat pain with ETT, RASS 1-2 despite sedation. Failed extubation 5/15. Tolerating SBT PSV 10/5, FiO2 35%. ~1630 desaturated with inability to pass [place] suction/difficulty bagging; urgent tube exchange completed with bougie and blood clot removed with improvement in oxygenation/ventilation. 5/17 Tracheostomy placed 5/19 Back in atrial fibrillation in am, HR 150s 5/20 weaned on pressure support 10/5 5/21 desaturated during weaning. Still on dex and fent gtt 5/23 added fent patch and seroquel had to have NE gtt and midodrine started for drug related hypotension 5/24 trialing fent gtt off. Foley removed 5/25 3 hrs. trach collar 5/2 6 pressure support minimal trach collar   Interim History / Subjective:  Minimal trach collar yesterday.  Been on trach collar this morning.  Difficult to communicate.  Will ask SLP to reeval speaking valve  Objective:  Blood pressure 109/62, pulse 89, temperature 98.1 F (36.7 C), temperature source Axillary, resp. rate 17, height 5\' 8"  (1.727 m), weight 80.6 kg, SpO2 95 %.    Vent Mode: SIMV/PC/PS FiO2 (%):  [40 %-50 %] 50 % Set Rate:  [10 bmp] 10 bmp Vt Set:  [510 mL] 510 mL PEEP:  [8 cmH20] 8 cmH20 Pressure Support:  [10 cmH20-12 cmH20] 10 cmH20 Plateau Pressure:  [17 cmH20-24 cmH20] 21 cmH20   Intake/Output Summary (Last 24 hours) at 02/06/2023 0919 Last data filed at 02/06/2023 0736 Gross per 24 hour  Intake 2996.61 ml  Output 1000 ml  Net 1996.61 ml    Filed Weights   02/04/23 0500 02/05/23 0500 02/06/23 0707  Weight: 80.6 kg 80.7 kg 80.6 kg   Physical Examination: General chronically ill 67 year old female more alert than prior HENT NCAT trach is 6 cuffed. Midline unremarkable Pulm coarse breath sounds Card  rrr Abd soft Ext warm and dry had dependent edema  Neuro/psych  Follows commands. Appropriate  Gu cl yellow   Resolved Hospital Problem List:  Hypertriglyceridemia due to propofol Hyponatremia Hypotension, Drug induced from sedation  Vocal cord edema  Post obstructive PNA: Zosyn completed 5/13 Assessment & Plan:   Tracheostomy and ventilator dependence due to lung cancer with failure to wean: -- Pressure support trials, trial trach collar, unable to complete due to anxiety but this seems to be improving with titration of meds as below, possible lung mass is also limiting ability -- Discussed at length with patient that this is limiting her ability to rehab and leave the ICU in hospital and go home --SLP eval for speaking valve - inline vs PMV   Anxiety/toxic encephalopathy due to sedatives: -- On continuous Precedex, wean off fentany 5/24 -- Continue per tube oxycodone, fentanyl patch, Seroquel, clonazepam (increased 5/25) -- Prolonged Precedex use, starting down titration, decrease again to 0.4 ceiling, will request other providers do not increase   Hypotension: Intermittent, related to sedatives, likely sedative related. -- Norepinephrine as needed, MAP goal greater than 65 -- midodrine per tube --diuresis holiday   Atrial fibrillation: Rate reasonably controlled -- Continue Eliquis, add rate control  if needed   Hyperglycemia: -- Sliding scale insulin   Urinary retention: -- Continue bethanechol, now again with retention after Foley removal 5/24, replaced 5/25  Constipation: With previous loose stools.  Suspect opiate related. -- MiraLAX and senna twice daily 5/26 with resulting BMs  Best Practice: (right click and "Reselect all SmartList Selections" daily)   Diet/type: tubefeeds DVT prophylaxis: Eliquis twice daily GI prophylaxis: H2B Lines: Central line - PICC Foley:  Yes Code Status:  full code Last date of multidisciplinary goals of care discussion: Updated  patient extensively 5/25 discussed limitation of ventilator dependence from liberating from the ICU and thus going home discussed likely take weeks of rehab before she could return home  Critical care time:    CRITICAL CARE Performed by: Karren Burly   Total critical care time: 30 minutes  Critical care time was exclusive of separately billable procedures and treating other patients.  Critical care was necessary to treat or prevent imminent or life-threatening deterioration.  Critical care was time spent personally by me on the following activities: development of treatment plan with patient and/or surrogate as well as nursing, discussions with consultants, evaluation of patient's response to treatment, examination of patient, obtaining history from patient or surrogate, ordering and performing treatments and interventions, ordering and review of laboratory studies, ordering and review of radiographic studies, pulse oximetry and re-evaluation of patient's condition.   Karren Burly, MD [facility] Pulmonary/Critical Care See Amion for contact info OR # [phone] if no answer  02/06/2023, 9:19 AM

## 2023-02-06 NOTE — Progress Notes (Signed)
Placed on 50% ATC at 0820.  Pt tolerating well

## 2023-02-06 NOTE — Progress Notes (Signed)
SLP Cancellation Note  Patient Details Name: [name] MRN: [id number] DOB: [birth year]   Cancelled treatment:       Reason Eval/Treat Not Completed: Patient not medically ready;Other (comment) (has been tolerating short periods of vent weaning but still on vent. SLP will continue to follow for readiness.)   Angela Nevin, MA, CCC-SLP Speech Therapy

## 2023-02-06 NOTE — Progress Notes (Addendum)
Nutrition Follow-up  DOCUMENTATION CODES:   Severe malnutrition in context of acute illness/injury  INTERVENTION:   Continue goal TF: Osmolite 1.5 @ 55 ml/hr (1320 ml/day) PROSource TF20 60 ml daily - Provides 2060 kcal, 103 grams of protein, and 1006 ml of H2O.   - Continue Banatrol TF BID another 5 days to aid in bulking of stools. -Recommend bowel regimen be made PRN as pt is having type 7 stools  - FWF per CCM/MD.   - MVI with minerals daily per tube.  NUTRITION DIAGNOSIS:   Severe Malnutrition related to acute illness as evidenced by moderate fat depletion, moderate muscle depletion.  Ongoing.  GOAL:   Patient will meet greater than or equal to 90% of their needs  Meeting with tube feeding.  MONITOR:   Vent status, Labs, Weight trends, TF tolerance  REASON FOR ASSESSMENT:   Ventilator, Consult Enteral/tube feeding initiation and management  ASSESSMENT:   67 y.o. female with PMHx including adenocarcinoma of LLL now with recurrence, COPD, and anxiety presents with acute on chronic hypoxemic respiratory failure likely multifactorial  5/2 Admit 5/6 Intubated; Osm 1.5 started at 61mL/hr 5/8 Osm 1.5 increased to goal of 4mL/hr 5/15 Extubated by quickly re-intubated; TF restarted at 66mL/hr due to xray showing stomach distension 5/17 s/p trach  Patient on ATC this morning.  Patient tolerating Osmolite 1.5 @ 55 ml/hr.  Rectal tube was removed 5/25. Having type 7 BMs, will continue Banatrol for another 5 days.   Admission weight: 174 lbs Current weight: 177 lbs -same wt as documented on 5/24 and 5/25, question accuracy  Medications: Tums, Pepcid, Banatrol, Remeron, Multivitamin with minerals daily, Precedex,  Miralax, Senokot   Labs reviewed: CBGs: 97-149   Diet Order:   Diet Order             Diet NPO time specified  Diet effective ____                   EDUCATION NEEDS:   Education needs have been addressed  Skin:  Skin Assessment:  Reviewed RN Assessment  Last BM:  5/27 -type 7  Height:   Ht Readings from Last 1 Encounters:  01/31/23 5\' 8"  (1.727 m)    Weight:   Wt Readings from Last 1 Encounters:  02/06/23 80.6 kg    BMI:  Body mass index is 27.02 kg/m.  Estimated Nutritional Needs:   Kcal:  1900-2100  Protein:  95-120 g  Fluid:  >1.9 L   Tilda Franco, MS, RD, LDN Inpatient Clinical Dietitian Contact information available via Amion

## 2023-02-06 NOTE — Progress Notes (Signed)
RT Note: Pt. was tolerating Vent rather well while on CPAP/PS, placed back to SIMV/PRVC/PS:VT-510/R-10/Peep+8/PS+10/40% FI02. currently tolerating well, RN made aware, RT to monitor.

## 2023-02-07 ENCOUNTER — Other Ambulatory Visit: Payer: Self-pay

## 2023-02-07 ENCOUNTER — Ambulatory Visit
Admission: RE | Admit: 2023-02-07 | Discharge: 2023-02-07 | Disposition: A | Discharge status: Home / Self Care | Payer: 59 | Source: Ambulatory Visit | Attending: Radiation Oncology | Admitting: Radiation Oncology

## 2023-02-07 DIAGNOSIS — Z510 Encounter for antineoplastic radiation therapy: Secondary | ICD-10-CM | POA: Diagnosis not present

## 2023-02-07 DIAGNOSIS — Z87891 Personal history of nicotine dependence: Secondary | ICD-10-CM | POA: Diagnosis not present

## 2023-02-07 DIAGNOSIS — J9601 Acute respiratory failure with hypoxia: Secondary | ICD-10-CM | POA: Diagnosis not present

## 2023-02-07 DIAGNOSIS — C3432 Malignant neoplasm of lower lobe, left bronchus or lung: Secondary | ICD-10-CM | POA: Diagnosis not present

## 2023-02-07 LAB — RAD ONC ARIA SESSION SUMMARY
Course Elapsed Days: 20
Plan Fractions Treated to Date: 11
Plan Prescribed Dose Per Fraction: 2 Gy
Plan Total Fractions Prescribed: 27
Plan Total Prescribed Dose: 54 Gy
Reference Point Dosage Given to Date: 22 Gy
Reference Point Session Dosage Given: 2 Gy
Session Number: 14

## 2023-02-07 LAB — CBC
HCT: 27.4 % — ABNORMAL LOW (ref 36.0–46.0)
Hemoglobin: 8.4 g/dL — ABNORMAL LOW (ref 12.0–15.0)
MCH: 27.2 pg (ref 26.0–34.0)
MCHC: 30.7 g/dL (ref 30.0–36.0)
MCV: 88.7 fL (ref 80.0–100.0)
Platelets: 410 10*3/uL — ABNORMAL HIGH (ref 150–400)
RBC: 3.09 MIL/uL — ABNORMAL LOW (ref 3.87–5.11)
RDW: 14.8 % (ref 11.5–15.5)
WBC: 10 10*3/uL (ref 4.0–10.5)
nRBC: 0 % (ref 0.0–0.2)

## 2023-02-07 LAB — GLUCOSE, CAPILLARY
Glucose-Capillary: 150 mg/dL — ABNORMAL HIGH (ref 70–99)
Glucose-Capillary: 144 mg/dL — ABNORMAL HIGH (ref 70–99)
Glucose-Capillary: 144 mg/dL — ABNORMAL HIGH (ref 70–99)
Glucose-Capillary: 116 mg/dL — ABNORMAL HIGH (ref 70–99)

## 2023-02-07 LAB — BASIC METABOLIC PANEL
Anion gap: 8 (ref 5–15)
BUN: 27 mg/dL — ABNORMAL HIGH (ref 8–23)
CO2: 28 mmol/L (ref 22–32)
Calcium: 8.4 mg/dL — ABNORMAL LOW (ref 8.9–10.3)
Chloride: 100 mmol/L (ref 98–111)
Creatinine, Ser: 0.8 mg/dL (ref 0.44–1.00)
GFR, Estimated: >60 mL/min (ref >60)
Glucose, Bld: 165 mg/dL — ABNORMAL HIGH (ref 70–99)
Potassium: 3.9 mmol/L (ref 3.5–5.1)
Sodium: 136 mmol/L (ref 135–145)

## 2023-02-07 MED ORDER — ACETAMINOPHEN 325 MG PO TABS: 650.0000 mg | ORAL_TABLET | Freq: Four times a day (QID) | ORAL | Status: DC | PRN

## 2023-02-07 MED ORDER — POLYETHYLENE GLYCOL 3350 17 G PO PACK: 17.0000 g | PACK | Freq: Every day | ORAL | Status: DC

## 2023-02-07 MED ORDER — STERILE WATER FOR INJECTION IJ SOLN
INTRAMUSCULAR | Status: AC
  Filled 2023-02-07: qty 10

## 2023-02-07 MED ORDER — ALTEPLASE 2 MG IJ SOLR
2.0000 mg | Freq: Once | INTRAMUSCULAR | Status: AC
  Administered 2023-02-07: 2 mg
  Filled 2023-02-07: qty 2

## 2023-02-07 MED ORDER — HYDROMORPHONE BOLUS VIA INFUSION: 0.2500 mg | INTRAVENOUS | Status: DC | PRN

## 2023-02-07 MED ORDER — HYDROMORPHONE HCL 2 MG PO TABS
4.0000 mg | ORAL_TABLET | ORAL | Status: DC
  Administered 2023-02-07 (×2): 4 mg
  Filled 2023-02-07 (×2): qty 2

## 2023-02-07 MED ORDER — GABAPENTIN 300 MG PO CAPS
600.0000 mg | ORAL_CAPSULE | Freq: Three times a day (TID) | ORAL | Status: DC
  Administered 2023-02-07 – 2023-03-01 (×66): 600 mg
  Filled 2023-02-07 (×67): qty 2

## 2023-02-07 MED ORDER — HYDROMORPHONE HCL 1 MG/ML IJ SOLN
1.0000 mg | Freq: Once | INTRAMUSCULAR | Status: AC
  Administered 2023-02-07: 1 mg via INTRAVENOUS
  Filled 2023-02-07: qty 1

## 2023-02-07 MED ORDER — DOCUSATE SODIUM 50 MG/5ML PO LIQD
100.0000 mg | Freq: Two times a day (BID) | ORAL | Status: DC
  Filled 2023-02-07: qty 10

## 2023-02-07 MED ORDER — HYDROMORPHONE HCL 2 MG PO TABS
2.0000 mg | ORAL_TABLET | ORAL | Status: DC
  Administered 2023-02-07 – 2023-02-08 (×3): 2 mg
  Filled 2023-02-07 (×3): qty 1

## 2023-02-07 MED ORDER — HYDROMORPHONE HCL-NACL 50-0.9 MG/50ML-% IV SOLN
0.0000 mg/h | INTRAVENOUS | Status: DC
  Administered 2023-02-07: 0.5 mg/h via INTRAVENOUS
  Filled 2023-02-07: qty 50

## 2023-02-07 MED ORDER — QUETIAPINE FUMARATE 100 MG PO TABS
100.0000 mg | ORAL_TABLET | Freq: Two times a day (BID) | ORAL | Status: DC
  Administered 2023-02-07 – 2023-02-15 (×18): 100 mg
  Filled 2023-02-07 (×18): qty 1

## 2023-02-07 MED ORDER — METHOCARBAMOL 500 MG PO TABS
500.0000 mg | ORAL_TABLET | Freq: Three times a day (TID) | ORAL | Status: DC
  Administered 2023-02-07 – 2023-02-15 (×27): 500 mg
  Filled 2023-02-07 (×27): qty 1

## 2023-02-07 MED ORDER — ACETAMINOPHEN 160 MG/5ML PO SOLN
650.0000 mg | Freq: Four times a day (QID) | ORAL | Status: DC | PRN
  Administered 2023-02-07 – 2023-02-28 (×19): 650 mg
  Filled 2023-02-07 (×19): qty 20.3

## 2023-02-07 NOTE — Progress Notes (Signed)
   02/07/23 0321  Vent Select  $ Ventilator Initial/Subsequent  Subsequent  Invasive or Noninvasive Invasive  Adult Vent Y  Tracheostomy Shiley Flexible 6 mm Cuffed  Placement Date/Time: 01/27/23 1400   Inserted prior to hospital arrival?: Other (Comment)  Placed By: ICU physician  Brand: Shiley Flexible  Size (mm): 6 mm  Style: Cuffed  Status Secured with trach ties  Site Assessment Clean;Dry  Site Care Protective barrier to skin  Inner Cannula Care Changed/new  Ties Assessment Clean, Dry  Tracheostomy Equipment at bedside Yes and checklist posted at head of bed  Adult Ventilator Settings  Vent Type Servo i  Humidity HME  Vent Mode SIMV;PRVC;PSV  Vt Set 510 mL  Set Rate 10 bmp  FiO2 (%) 40 %  I Time 0.9 Sec(s)  I:E Ratio Set 1:3.2  Pressure Support 10 cmH20  PEEP 8 cmH20  Adult Ventilator Measurements  Peak Airway Pressure 19 L/min  Mean Airway Pressure 12 cmH20  Plateau Pressure 14 cmH20  Resp Rate Spontaneous 7 br/min  Resp Rate Total 27 br/min  Exhaled Vt 506 mL  Measured Ve 14.5 L  I:E Ratio Measured 1:1.3  Auto PEEP 0 cmH20  Total PEEP 8 cmH20  Adult Ventilator Alarms  Alarms On Y  Ve High Alarm 20 L/min  Ve Low Alarm 4 L/min  Resp Rate High Alarm 40 br/min  Resp Rate Low Alarm 8  PEEP Low Alarm 3 cmH2O  Press High Alarm 45 cmH2O  Press Low Alarm 3 cmH2O  T Apnea 20 sec(s)  VAP Prevention  HOB> 30 Degrees Y  Daily Weaning Assessment  Daily Assessment of Readiness to Wean Wean protocol criteria met (SBT performed) (After 0700)  Breath Sounds  Bilateral Breath Sounds Clear;Diminished  R Upper  Breath Sounds Diminished  L Upper Breath Sounds Diminished  Vent Respiratory Assessment  Patient Effort Good  Level of Consciousness Responds to Voice  Respiratory Pattern Regular;Unlabored;Symmetrical  Respiratory Effort Mild  Patient Tolerance Tolerated well  Suction Method  Respiratory Interventions Airway suction;Oral suction  Airway Suctioning/Secretions   Suction Type Tracheal  Suction Device  Catheter  Secretion Amount Other (Comment) (Scant)  Secretion Color Other (Comment) (Scant)  Secretion Consistency Thin  Suction Tolerance Tolerated well  Suctioning Adverse Effects None

## 2023-02-07 NOTE — Progress Notes (Signed)
PT Cancellation Note  Patient Details Name: [name] MRN: [id number] DOB: 02/28/1956   Cancelled Treatment:    Reason Eval/Treat Not Completed: Fatigue/lethargy limiting ability to participate. Per RN, sleepy this PM. Will check back tomorrow. Blanchard Kelch PT Acute Rehabilitation Services Office [phone] Weekend pager-[phone]    Rada Hay 02/07/2023, 4:21 PM

## 2023-02-07 NOTE — Progress Notes (Signed)
   02/06/23 2257  Vent Select  Invasive or Noninvasive Invasive  Adult Vent Y  Tracheostomy Shiley Flexible 6 mm Cuffed  Placement Date/Time: 01/27/23 1400   Inserted prior to hospital arrival?: Other (Comment)  Placed By: ICU physician  Brand: Shiley Flexible  Size (mm): 6 mm  Style: Cuffed  Status Secured with trach ties  Site Assessment Clean;Dry  Site Care Protective barrier to skin (remains in place)  Ties Assessment Clean, Dry (Secure)  Tracheostomy Equipment at bedside Yes and checklist posted at head of bed  Adult Ventilator Settings  Vent Type Servo i  Humidity HME  Vent Mode SIMV;PRVC;PSV  Vt Set 510 mL  Set Rate 10 bmp  FiO2 (%) 40 %  I Time 0.9 Sec(s)  Pressure Support 10 cmH20  PEEP 8 cmH20  Adult Ventilator Measurements  Peak Airway Pressure 19 L/min  Mean Airway Pressure 12 cmH20  Resp Rate Spontaneous 26 br/min  Exhaled Vt 555 mL  Measured Ve 11.6 L  Total PEEP 8 cmH20  Adult Ventilator Alarms  Alarms On Y  Ve High Alarm 20 L/min  Ve Low Alarm 4 L/min  Resp Rate High Alarm 40 br/min  Resp Rate Low Alarm 8  PEEP Low Alarm 3 cmH2O  Press High Alarm 45 cmH2O  Press Low Alarm 3 cmH2O  T Apnea 20 sec(s)  VAP Prevention  HOB> 30 Degrees Y  Breath Sounds  Bilateral Breath Sounds Clear;Diminished  R Upper  Breath Sounds Clear;Diminished  L Upper Breath Sounds Clear;Diminished  Vent Respiratory Assessment  Patient Effort Good  Level of Consciousness Responds to Voice  Respiratory Pattern Regular;Unlabored;Symmetrical  Patient Tolerance Tolerated well  Airway Suctioning/Secretions  Suction Type Tracheal  Suction Device  Catheter  Secretion Amount None  Suction Tolerance Tolerated well  Suctioning Adverse Effects None

## 2023-02-07 NOTE — Progress Notes (Signed)
Patient running fever on noon check. 101.2 Karie Schwalbe MD aware, Tylenol ordered and administered

## 2023-02-07 NOTE — Progress Notes (Signed)
   02/07/23 2013  Vent Select  Invasive or Noninvasive Invasive  Adult Vent Y  Tracheostomy Shiley Flexible 6 mm Cuffed  Placement Date/Time: 01/27/23 1400   Inserted prior to hospital arrival?: Other (Comment)  Placed By: ICU physician  Brand: Shiley Flexible  Size (mm): 6 mm  Style: Cuffed  Status Secured with trach ties  Site Assessment Clean;Dry  Site Care Protective barrier to skin  Ties Assessment Clean, Dry  Cuff Pressure (cm H2O) Green OR 18-26 CmH2O  Tracheostomy Equipment at bedside Yes and checklist posted at head of bed  Adult Ventilator Settings  Vent Type Servo i  Humidity HME  Vent Mode SIMV/PC/PS  Vt Set 510 mL  Set Rate 10 bmp  FiO2 (%) 40 %  I Time 0.9 Sec(s)  Pressure Support 10 cmH20  PEEP 8 cmH20  Adult Ventilator Measurements  Peak Airway Pressure 23 L/min  Mean Airway Pressure 13 cmH20  Plateau Pressure 17 cmH20  Resp Rate Spontaneous 12 br/min  Resp Rate Total 22 br/min  Exhaled Vt 520 mL  Measured Ve 8.2 L  Auto PEEP 0 cmH20  Total PEEP 8 cmH20  Adult Ventilator Alarms  Alarms On Y  Ve High Alarm 20 L/min  Ve Low Alarm 4 L/min  Resp Rate High Alarm 40 br/min  Resp Rate Low Alarm 8  PEEP Low Alarm 3 cmH2O  Press High Alarm 45 cmH2O  T Apnea 3 sec(s)  Delay Alarm 20 sec(s)  VAP Prevention  HOB> 30 Degrees Y  Breath Sounds  Bilateral Breath Sounds Clear;Diminished  R Upper  Breath Sounds Clear;Diminished  L Upper Breath Sounds Clear;Diminished  Vent Respiratory Assessment  Patient Effort Good  Level of Consciousness Responds to Voice  Respiratory Pattern Regular;Unlabored;Symmetrical  Respiratory Effort Mild  Patient Tolerance Tolerated well  Suction Method  Respiratory Interventions Airway suction  Airway Suctioning/Secretions  Suction Type Tracheal  Suction Device  Catheter  Secretion Amount Other (Comment) (Scant)  Secretion Consistency Frothy;Thin  Suction Tolerance Tolerated well  Suctioning Adverse Effects None

## 2023-02-07 NOTE — Progress Notes (Signed)
   02/06/23 1951  Vent Select  Invasive or Noninvasive Invasive  Adult Vent Y  Tracheostomy Shiley Flexible 6 mm Cuffed  Placement Date/Time: 01/27/23 1400   Inserted prior to hospital arrival?: Other (Comment)  Placed By: ICU physician  Brand: Shiley Flexible  Size (mm): 6 mm  Style: Cuffed  Status Secured with trach ties  Site Assessment Clean;Dry  Site Care Protective barrier to skin (Remains)  Ties Assessment Clean, Dry  Cuff Pressure (cm H2O) Green OR 18-26 CmH2O  Tracheostomy Equipment at bedside Yes and checklist posted at head of bed  Adult Ventilator Settings  Vent Type Servo i  Humidity HME  Vent Mode PSV;CPAP  FiO2 (%) 40 %  Pressure Support 10 cmH20  PEEP 8 cmH20  Adult Ventilator Measurements  Peak Airway Pressure 18 L/min  Mean Airway Pressure 11 cmH20  Resp Rate Spontaneous 20 br/min  Exhaled Vt 468 mL  Spont TV 450 mL  Measured Ve 8.6 L  Adult Ventilator Alarms  Alarms On Y  Ve High Alarm 20 L/min  Ve Low Alarm 4 L/min  Resp Rate High Alarm 43 br/min (found at this setting)  Resp Rate Low Alarm 8  PEEP Low Alarm 3 cmH2O  Press High Alarm 45 cmH2O  Press Low Alarm 3 cmH2O  T Apnea 20 sec(s)  VAP Prevention  Cuff pressure (initial) 20 cm H2O (MOV)  HOB> 30 Degrees Y  Equipment wiped down Yes  Breath Sounds  Bilateral Breath Sounds Diminished;Clear (Slight Coarse I/E-good aeration)  R Upper  Breath Sounds Diminished  L Upper Breath Sounds Diminished  Vent Respiratory Assessment  Patient Effort Good  Level of Consciousness Responds to Voice  Respiratory Pattern Regular;Unlabored;Symmetrical  Respiratory Effort Mild  Patient Tolerance Tolerated well

## 2023-02-07 NOTE — TOC Progression Note (Signed)
Transition of Care [facility]) - Progression Note    Patient Details  Name: [name] MRN: [id number] Date of Birth: 1956-02-21  Transition of Care [facility]) CM/SW Contact  Lavenia Atlas, RN Phone Number: 02/07/2023, 12:01 PM  Clinical Narrative:   Per chart review PT recommended LTAC however patient actively continues radiation therapy, and is not a candidate for LTAC. Family is not interested in hospice. Palliative is following for GOC.  TOC will continue to follow for needs.      Expected Discharge Plan: Long Term Acute Care (LTAC) Barriers to Discharge: Continued Medical Work up  Expected Discharge Plan and Services In-house Referral: Chaplain, Hospice / Palliative Care Discharge Planning Services: CM Consult Post Acute Care Choice: Long Term Acute Care (LTAC) Living arrangements for the past 2 months: Apartment Expected Discharge Date: 01/14/23               DME Arranged: N/A DME Agency: NA       HH Arranged: NA HH Agency: NA         Social Determinants of Health (SDOH) Interventions SDOH Screenings   Food Insecurity: No Food Insecurity (01/13/2023)  Housing: Low Risk  (01/13/2023)  Transportation Needs: No Transportation Needs (01/13/2023)  Utilities: Not At Risk (01/13/2023)  Alcohol Screen: Low Risk  (12/13/2022)  Depression (PHQ2-9): Low Risk  (12/13/2022)  Financial Resource Strain: Low Risk  (12/13/2022)  Physical Activity: Inactive (12/13/2022)  Social Connections: Moderately Integrated (12/13/2022)  Stress: No Stress Concern Present (12/13/2022)  Tobacco Use: Medium Risk (01/22/2023)    Readmission Risk Interventions    02/01/2023   12:07 PM  Readmission Risk Prevention Plan  Transportation Screening Complete  PCP or Specialist Appt within 3-5 Days Complete  HRI or Home Care Consult Complete  Social Work Consult for Recovery Care Planning/Counseling Complete  Palliative Care Screening Complete  Medication Review Oceanographer) Complete

## 2023-02-07 NOTE — Progress Notes (Signed)
NAME:  [name], MRN:  [id number], DOB:  1956-08-06, LOS: 26 ADMISSION DATE:  01/11/2023, CONSULTATION DATE:  01/16/2023 REFERRING MD: FMTS, CHIEF COMPLAINT: Acute Respiratory Failure in the setting of Lung Cancer, COPD and infiltrate on CXR   History of Present Illness:  67 year old woman who presented to [facility] 5/1 for nausea/vomiting/diarrhea and abdominal pain x 2 weeks. PMHx significant for COPD, lung CA (adenocarcinoma of LLL, biopsy planned for 5/7), bladder CA (s/p TURBT), GERD, and panic/anxiety. Admitted for AHRF in setting of CAP/COPD exacerbation to FMTS at [facility].   Found to have postobstructive PNA (RML/RLL) and R hilar mass with progressive respiratory failure requiring intubation 5/6. Patient also experienced panic attacks/anxiety leading to exacerbated WOB and VCD with associated stridor. Of note, patient is a DNR; however she endorses that she is not ready to die and wanted to be intubated. (See Prior Note documentation 01/16/2023). Transferred to [facility] 5/7 for ongoing oncological care.  Pertinent Medical History:   Past Medical History:  Diagnosis Date   Abnormal CT lung screening 07/10/2020   Anxiety    Anxiety state 02/28/2012   GAD7 : 6  PHQ9 : 6   Arthritis    Bladder cancer (HCC) 09/2020   urologist--- dr Berneice Heinrich, incidental finding on PET scan 12/ 2021,  s/p TURBT 09-18-2020 high grade papillary urothelial    Cancer related pain 03/11/2022   CAP (community acquired pneumonia) 04/20/2022   Centrilobular emphysema (HCC)    Chronic left shoulder pain 04/29/2021   Close exposure to COVID-19 virus 05/03/2019   Constipation 04/09/2019   Costochondritis, acute 08/14/2020   DDD (degenerative disc disease), lumbar    Depression    DOE (dyspnea on exertion)    10-29-2020  per pt can do house work without sob, when walks/ stairs stops frequently  (stated recovers quickly when stops)   Emphysema lung (HCC)    Epidermoid cyst 10/29/2018   Recurrent. Located on left hip at iliac  crest 2" anterior to midaxillary line. Removed 2013, 2020, 2022.    Epigastric pain 11/16/2020   Fibroid 2003   GERD (gastroesophageal reflux disease)    History of radiation therapy    Left lung- 10/06/20-10/16/20- Dr. Antony Blackbird   Leg cramping 11/13/2019   Lumbar spine pain 01/09/2015   Menopausal vaginal dryness 11/13/2019   Multiple closed fractures of ribs of left side 08/20/2022   Nocturnal enuresis 06/23/2020   Chronic overnight "leaking" of unknown source. Likely urine, but reports no color or odor. Volume enough for liners or light pads. Occurs every night.    Primary adenocarcinoma of lower lobe of left lung (HCC) 07/2020   oncologist--- dr Arbutus Ped---  dx 11/ 2021 bilateral lower lobe masses,  s/p bronchoscopy w/ bx's 07-28-2020 malignancy cells on left ;  completed SBRT bilateral lower lung 10-16-2020 5 fractions   Radiation-induced dermatitis    on 10-29-2020 pt complaint stomach and chest areas,  completed SBRT 10-16-2020   Seasonal allergies    Stage 3 severe COPD by GOLD classification [facility])    pulmonologist--- dr Tonia Brooms,  using anoro inhaler daily   Wears glasses    Significant Hospital Events: Including procedures, antibiotic start and stop dates in addition to other pertinent events   01/16/2023 Transferred to ICU for acute Respiratory Failure , intubated  5/7 Bronch by Icard. significant extrinsic compression of the airways in the right lower lobe and middle lobe. This is likely malignant obstruction. Bx sent. Transferred to [facility] for rad/onc eval. PICC placed 5/8 Stable still  high PEEP/FiO2 needs. Rad/onc consult requested  5/10 Tolerating SBT trial this AM on low-dose fentanyl and Precedex.  Propofol DC'd due to high triglycerides 5/11 Tolerates SBT's, pressure support 10/5 5/12 Off of pressors. Remains on vent, sedated with Precedex/Fentanyl. PMT consulted. 5/13 Weaning on PSV 14/8. > 10L positive over the course of admission. Diuresing. 5/14 Weaning on PSV. Ongoing  diuresis. 5/15 Tolerating SBT, PSV 5/5, following commands. Continue diuresis, Foley placed for persistent urinary retention. Extubated, decompensated quickly after requiring reintubation. DHT placed. 5/16 Agitated overnight, c/o throat pain with ETT, RASS 1-2 despite sedation. Failed extubation. Tolerating SBT PSV 10/5, FiO2 35%. ~1630 desaturated with inability to pass [place] suction/difficulty bagging; urgent tube exchange completed with bougie and blood clot removed with improvement in oxygenation/ventilation. 5/17 Tracheostomy placed 5/19 Back in atrial fibrillation in am, HR 150s 5/20 Weaned on pressure support 10/5 5/21 Desaturated during weaning. Still on dex and fent gtt 5/23 added fent patch and seroquel had to have NE gtt and midodrine started for drug related hypotension 5/24 Trialing fent gtt off. Foley removed 5/25 3 hrs. trach collar 5/26 Pressure support minimal trach collar 5/28 SIMV/PS, increased pain.  Interim History / Subjective:  No significant events overnight Remains anxious Pain worse today, pointing to chest/back Feels uncomfortable in the bed, would like to get OOBTC Tachycardic to 140s, remains on SIMV/PS Switching medications to Dilaudid  Objective:  Blood pressure (!) 182/83, pulse (!) 141, temperature 98.7 F (37.1 C), temperature source Axillary, resp. rate (!) 24, height 5\' 8"  (1.727 m), weight 80.6 kg, SpO2 94 %.    Vent Mode: SIMV;PRVC;PSV FiO2 (%):  [40 %-50 %] 40 % Set Rate:  [10 bmp] 10 bmp Vt Set:  [510 mL] 510 mL PEEP:  [8 cmH20] 8 cmH20 Pressure Support:  [10 cmH20] 10 cmH20 Plateau Pressure:  [14 cmH20-21 cmH20] 14 cmH20   Intake/Output Summary (Last 24 hours) at 02/07/2023 0725 Last data filed at 02/07/2023 0600 Gross per 24 hour  Intake 4461.44 ml  Output 1475 ml  Net 2986.44 ml    Filed Weights   02/05/23 0500 02/06/23 0707 02/07/23 0500  Weight: 80.7 kg 80.6 kg 80.6 kg   Physical Examination: General: Chronically ill-appearing  middle-aged woman in NAD, appears anxious/uncomfortable. HEENT: [place]/AT, anicteric sclera, PERRL, moist mucous membranes. Neck: Tracheostomy in place, no surrounding erythema/drainage. Moderate secretions. Neuro: Awake, oriented x 4, mouthing answers to questions appropriately. Responds to verbal stimuli. Following commands consistently. Moves all 4 extremities spontaneously. Generalized weakness. CV: Tachycardic to 140s, regular rhythm, no m/g/r. PULM: Breathing even and mildly labored on vent (SIMV/PS). Lung fields diminished R > L. GI: Soft, nontender, nondistended. Normoactive bowel sounds. Extremities: Bilateral symmetric trace LE edema noted. Skin: Warm/dry, no rashes.  Resolved Hospital Problem List:  Hypertriglyceridemia due to propofol Hyponatremia Hypotension, Drug induced from sedation  Vocal cord edema  Post obstructive PNA: Zosyn completed 5/13  Assessment & Plan:   Tracheostomy and ventilator dependence due to lung cancer with failure to wean Large R hilar lung mass c/b post obstructive atelectasis History of LLL adenocarcinoma COPD TCT severely limited by patient's anxiety, additional factor of lung mass. Inability to liberate from vent is keeping patient in ICU at present; will require extensive rehab to be able to transition eventually to home if that remains patient's wish. - Continue vent support, SIMV/PS - Continue pressure support and TCT as tolerated; inability to tolerate TCT is preventing progression out of ICU - Wean FiO2 for O2 sat > 90% - Continue bronchodilators (  Brovana/Yupelri, Pulmicort) - VAP bundle - Pulmonary hygiene - PAD protocol as below - SLP as indicated for PMV evaluation - PT/OT for generalized deconditioning - Continued XRT per Rad-Onc   Anxiety/toxic encephalopathy due to sedatives Acute-on-chronic pain - PAD protocol for sedation: Precedex and Dilaudid for goal RASS 0 to -1 (Precedex ceiling 0.4), Versed PRN - Scheduled oxycodone  switched to Dilaudid - Continue scheduled Klonopin, Seroquel, gabapentin, Remeron; add Robaxin - Continue home Celexa   Hypotension, Intermittent, likely sedative related. - Goal MAP > 65 - Levophed titrated to goal MAP - Continue midodrine - Caution in the setting of ongoing diuresis  Atrial fibrillation CHA2DS2-VASc Score at least 3. - Cardiac monitoring - Optimize electrolytes for K > 4, Mg > 2 - Eliquis for [facility]   Hyperglycemia - SSI - CBGs Q4H - Goal CBG 140-180   Urinary retention Recurrent retention after Foley removal 5/24, replaced 5/25. - Continue bethanechol TID  Constipation With previous loose stools.  Suspect opiate related. - Continue bowel regimen, escalate as needed  Best Practice: (right click and "Reselect all SmartList Selections" daily)   Diet/type: tubefeeds DVT prophylaxis: Eliquis BID GI prophylaxis: H2B Lines: Central line - PICC Foley:  Yes Code Status:  full code Last date of multidisciplinary goals of care discussion: Updated patient extensively 5/25 discussed limitation of ventilator dependence from liberating from the ICU and thus going home discussed likely take weeks of rehab before she could return home  Critical care time:    The patient is critically ill with multiple organ system failure and requires high complexity decision making for assessment and support, frequent evaluation and titration of therapies, advanced monitoring, review of radiographic studies and interpretation of complex data.   Critical Care Time devoted to patient care services, exclusive of separately billable procedures, described in this note is 39 minutes.  Tim Lair, PA-C [facility] 02/07/23 7:28 AM  Please see [email or web address] for pager details.  From 7A-7P if no response, please call [phone] After hours, please call ELink [phone]

## 2023-02-08 ENCOUNTER — Ambulatory Visit
Admission: RE | Admit: 2023-02-08 | Discharge: 2023-02-08 | Disposition: A | Discharge status: Home / Self Care | Payer: 59 | Source: Ambulatory Visit | Attending: Radiation Oncology | Admitting: Radiation Oncology

## 2023-02-08 ENCOUNTER — Other Ambulatory Visit: Payer: Self-pay

## 2023-02-08 DIAGNOSIS — D649 Anemia, unspecified: Secondary | ICD-10-CM | POA: Diagnosis not present

## 2023-02-08 DIAGNOSIS — J9601 Acute respiratory failure with hypoxia: Secondary | ICD-10-CM | POA: Diagnosis not present

## 2023-02-08 DIAGNOSIS — I959 Hypotension, unspecified: Secondary | ICD-10-CM

## 2023-02-08 DIAGNOSIS — Z87891 Personal history of nicotine dependence: Secondary | ICD-10-CM | POA: Diagnosis not present

## 2023-02-08 DIAGNOSIS — Z930 Tracheostomy status: Secondary | ICD-10-CM | POA: Diagnosis not present

## 2023-02-08 DIAGNOSIS — Z510 Encounter for antineoplastic radiation therapy: Secondary | ICD-10-CM | POA: Diagnosis not present

## 2023-02-08 DIAGNOSIS — C3432 Malignant neoplasm of lower lobe, left bronchus or lung: Secondary | ICD-10-CM | POA: Diagnosis not present

## 2023-02-08 LAB — GLUCOSE, CAPILLARY
Glucose-Capillary: 117 mg/dL — ABNORMAL HIGH (ref 70–99)
Glucose-Capillary: 139 mg/dL — ABNORMAL HIGH (ref 70–99)
Glucose-Capillary: 151 mg/dL — ABNORMAL HIGH (ref 70–99)
Glucose-Capillary: 139 mg/dL — ABNORMAL HIGH (ref 70–99)
Glucose-Capillary: 116 mg/dL — ABNORMAL HIGH (ref 70–99)
Glucose-Capillary: 112 mg/dL — ABNORMAL HIGH (ref 70–99)

## 2023-02-08 LAB — RAD ONC ARIA SESSION SUMMARY
Course Elapsed Days: 21
Plan Fractions Treated to Date: 12
Plan Prescribed Dose Per Fraction: 2 Gy
Plan Total Fractions Prescribed: 27
Plan Total Prescribed Dose: 54 Gy
Reference Point Dosage Given to Date: 24 Gy
Reference Point Session Dosage Given: 2 Gy
Session Number: 15

## 2023-02-08 LAB — ABO/RH: ABO/RH(D): O POS

## 2023-02-08 LAB — CBC
HCT: 24.6 % — ABNORMAL LOW (ref 36.0–46.0)
Hemoglobin: 7.5 g/dL — ABNORMAL LOW (ref 12.0–15.0)
MCH: 27.5 pg (ref 26.0–34.0)
MCHC: 30.5 g/dL (ref 30.0–36.0)
MCV: 90.1 fL (ref 80.0–100.0)
Platelets: 342 10*3/uL (ref 150–400)
RBC: 2.73 MIL/uL — ABNORMAL LOW (ref 3.87–5.11)
RDW: 15.1 % (ref 11.5–15.5)
WBC: 8.7 10*3/uL (ref 4.0–10.5)
nRBC: 0 % (ref 0.0–0.2)

## 2023-02-08 LAB — MAGNESIUM: Magnesium: 2.3 mg/dL (ref 1.7–2.4)

## 2023-02-08 LAB — TYPE AND SCREEN
ABO/RH(D): O POS
Antibody Screen: NEGATIVE

## 2023-02-08 LAB — BASIC METABOLIC PANEL
Anion gap: 8 (ref 5–15)
BUN: 27 mg/dL — ABNORMAL HIGH (ref 8–23)
CO2: 28 mmol/L (ref 22–32)
Calcium: 8.4 mg/dL — ABNORMAL LOW (ref 8.9–10.3)
Chloride: 100 mmol/L (ref 98–111)
Creatinine, Ser: 0.76 mg/dL (ref 0.44–1.00)
GFR, Estimated: >60 mL/min (ref >60)
Glucose, Bld: 185 mg/dL — ABNORMAL HIGH (ref 70–99)
Potassium: 4.1 mmol/L (ref 3.5–5.1)
Sodium: 136 mmol/L (ref 135–145)

## 2023-02-08 MED ORDER — SENNOSIDES 8.8 MG/5ML PO SYRP: 10.0000 mL | ORAL_SOLUTION | Freq: Every evening | ORAL | Status: DC | PRN

## 2023-02-08 MED ORDER — MAGNESIUM HYDROXIDE 400 MG/5ML PO SUSP: 15.0000 mL | Freq: Once | ORAL | Status: DC

## 2023-02-08 MED ORDER — FERROUS SULFATE 300 (60 FE) MG/5ML PO SOLN
300.0000 mg | Freq: Every day | ORAL | Status: DC
  Administered 2023-02-08 – 2023-03-01 (×21): 300 mg
  Filled 2023-02-08 (×24): qty 5

## 2023-02-08 MED ORDER — HYDROMORPHONE HCL 2 MG PO TABS
1.0000 mg | ORAL_TABLET | ORAL | Status: DC
  Administered 2023-02-08 – 2023-02-27 (×106): 1 mg
  Filled 2023-02-08 (×107): qty 1

## 2023-02-08 MED ORDER — DOCUSATE SODIUM 50 MG/5ML PO LIQD: 100.0000 mg | Freq: Every day | ORAL | Status: DC | PRN

## 2023-02-08 MED ORDER — FERROUS SULFATE 75 (15 FE) MG/ML PO SOLN: 15.0000 mg | Freq: Every day | ORAL | Status: DC

## 2023-02-08 MED ORDER — POLYETHYLENE GLYCOL 3350 17 G PO PACK: 17.0000 g | PACK | Freq: Every day | ORAL | Status: DC | PRN

## 2023-02-08 NOTE — Progress Notes (Signed)
Pt breathing 49 times a min due to anxiety RN aware.

## 2023-02-08 NOTE — Progress Notes (Addendum)
NAME:  [name], MRN:  [id number], DOB:  [birth year], LOS: 27 ADMISSION DATE:  01/11/2023, CONSULTATION DATE:  01/16/2023 REFERRING MD: FMTS, CHIEF COMPLAINT: Acute Respiratory Failure in the setting of Lung Cancer, COPD and infiltrate on CXR   History of Present Illness:  67 year old woman who presented to [facility] 5/1 for nausea/vomiting/diarrhea and abdominal pain x 2 weeks. PMHx significant for COPD, lung CA (adenocarcinoma of LLL, biopsy planned for 5/7), bladder CA (s/p TURBT), GERD, and panic/anxiety. Admitted for AHRF in setting of CAP/COPD exacerbation to FMTS at [facility].   Found to have postobstructive PNA (RML/RLL) and R hilar mass with progressive respiratory failure requiring intubation 5/6. Patient also experienced panic attacks/anxiety leading to exacerbated WOB and VCD with associated stridor. Of note, patient is a DNR; however she endorses that she is not ready to die and wanted to be intubated. (See Prior Note documentation 01/16/2023). Transferred to [facility] 5/7 for ongoing oncological care.  Pertinent Medical History:   Past Medical History:  Diagnosis Date   Abnormal CT lung screening 07/10/2020   Anxiety    Anxiety state 02/28/2012   GAD7 : 6  PHQ9 : 6   Arthritis    Bladder cancer (HCC) 09/2020   urologist--- dr Berneice Heinrich, incidental finding on PET scan 12/ 2021,  s/p TURBT 09-18-2020 high grade papillary urothelial    Cancer related pain 03/11/2022   CAP (community acquired pneumonia) 04/20/2022   Centrilobular emphysema (HCC)    Chronic left shoulder pain 04/29/2021   Close exposure to COVID-19 virus 05/03/2019   Constipation 04/09/2019   Costochondritis, acute 08/14/2020   DDD (degenerative disc disease), lumbar    Depression    DOE (dyspnea on exertion)    10-29-2020  per pt can do house work without sob, when walks/ stairs stops frequently  (stated recovers quickly when stops)   Emphysema lung (HCC)    Epidermoid cyst 10/29/2018   Recurrent. Located on left hip at iliac  crest 2" anterior to midaxillary line. Removed 2013, 2020, 2022.    Epigastric pain 11/16/2020   Fibroid 2003   GERD (gastroesophageal reflux disease)    History of radiation therapy    Left lung- 10/06/20-10/16/20- Dr. Antony Blackbird   Leg cramping 11/13/2019   Lumbar spine pain 01/09/2015   Menopausal vaginal dryness 11/13/2019   Multiple closed fractures of ribs of left side 08/20/2022   Nocturnal enuresis 06/23/2020   Chronic overnight "leaking" of unknown source. Likely urine, but reports no color or odor. Volume enough for liners or light pads. Occurs every night.    Primary adenocarcinoma of lower lobe of left lung (HCC) 07/2020   oncologist--- dr Arbutus Ped---  dx 11/ 2021 bilateral lower lobe masses,  s/p bronchoscopy w/ bx's 07-28-2020 malignancy cells on left ;  completed SBRT bilateral lower lung 10-16-2020 5 fractions   Radiation-induced dermatitis    on 10-29-2020 pt complaint stomach and chest areas,  completed SBRT 10-16-2020   Seasonal allergies    Stage 3 severe COPD by GOLD classification [facility])    pulmonologist--- dr Tonia Brooms,  using anoro inhaler daily   Wears glasses    Significant Hospital Events: Including procedures, antibiotic start and stop dates in addition to other pertinent events   01/16/2023 Transferred to ICU for acute Respiratory Failure , intubated  5/7 Bronch by Icard. significant extrinsic compression of the airways in the right lower lobe and middle lobe. This is likely malignant obstruction. Bx sent. Transferred to [facility] for rad/onc eval. PICC placed 5/8 Stable still  high PEEP/FiO2 needs. Rad/onc consult requested  5/10 Tolerating SBT trial this AM on low-dose fentanyl and Precedex.  Propofol DC'd due to high triglycerides 5/11 Tolerates SBT's, pressure support 10/5 5/12 Off of pressors. Remains on vent, sedated with Precedex/Fentanyl. PMT consulted. 5/13 Weaning on PSV 14/8. > 10L positive over the course of admission. Diuresing. 5/14 Weaning on PSV. Ongoing  diuresis. 5/15 Tolerating SBT, PSV 5/5, following commands. Continue diuresis, Foley placed for persistent urinary retention. Extubated, decompensated quickly after requiring reintubation. DHT placed. 5/16 Agitated overnight, c/o throat pain with ETT, RASS 1-2 despite sedation. Failed extubation. Tolerating SBT PSV 10/5, FiO2 35%. ~1630 desaturated with inability to pass [place] suction/difficulty bagging; urgent tube exchange completed with bougie and blood clot removed with improvement in oxygenation/ventilation. 5/17 Tracheostomy placed 5/19 Back in atrial fibrillation in am, HR 150s 5/20 Weaned on pressure support 10/5 5/21 Desaturated during weaning. Still on dex and fent gtt 5/23 added fent patch and seroquel had to have NE gtt and midodrine started for drug related hypotension 5/24 Trialing fent gtt off. Foley removed 5/25 3 hrs. trach collar 5/26 Pressure support minimal trach collar 5/28 SIMV/PS, increased pain. Changed oxy fent patch to dilaudid incr seroquel 5/29 possible PRBC pending consent   Interim History / Subjective:  No significant events overnight Remains anxious Pain worse today, pointing to chest/back Feels uncomfortable in the bed, would like to get OOBTC Tachycardic to 140s, remains on SIMV/PS Switching medications to Dilaudid  Objective:  Blood pressure (!) 108/50, pulse 91, temperature 98.4 F (36.9 C), temperature source Oral, resp. rate 20, height 5\' 8"  (1.727 m), weight 79.7 kg, SpO2 100 %.    Vent Mode: CPAP;PSV FiO2 (%):  [40 %] 40 % Set Rate:  [10 bmp] 10 bmp Vt Set:  [510 mL] 510 mL PEEP:  [8 cmH20] 8 cmH20 Pressure Support:  [10 cmH20] 10 cmH20 Plateau Pressure:  [16 cmH20-21 cmH20] 16 cmH20   Intake/Output Summary (Last 24 hours) at 02/08/2023 0844 Last data filed at 02/08/2023 5643 Gross per 24 hour  Intake 2056.81 ml  Output 826 ml  Net 1230.81 ml   Filed Weights   02/06/23 0707 02/07/23 0500 02/08/23 0335  Weight: 80.6 kg 80.6 kg 79.7 kg    Physical Examination: General: Chronically ill older adult F  HEENT: NCAT Trach secure Cortrak secure  Neuro: Drowsy, following commands, mouthing words  CV: rr cap refill < 3 sec  PULM: Symmetrical chest expansion, Some scattered coarse sounds  GI: soft round  GU: foley  Extremities: BLE trace edema, no acute joint deformity. RUE PICC   Skin: c/d/w   Resolved Hospital Problem List:  Hypertriglyceridemia due to propofol Hyponatremia Hypotension, Drug induced from sedation  Vocal cord edema  Post obstructive PNA: Zosyn completed 5/13  Assessment & Plan:   LLL adenocarcinoma Acute respiratory failure, now with VDRF  S/p tracheostomy  R hilar mass, post obstructive PNA and atelectasis COPD  Small bilateral pleural effusions  P -cont weaning vent support as able, unfortunately lack of TCT progress precludes progression from ICU  -routine trach care  -VAP pulm hygiene, mobility  -brovana yupelri  pulmicort  - xrt pre rad onc   Anxiety / depression  Acute encephalopathy AoC pain  P -precedex, dialudid gtt, PRN versed -- trying to decr utilization as able . Changed range of dilaudid gtt --effort to wean off.  -decr SCH dilaudid to 1mg  q4 hr -clonazepam,seroquel, remeron   - neurontin, robaxin, celexa  -qtc permitting, there is room to incr  some of these if needed   Hypotension -1x elevated temp 5/28, no bump in white count or further febrile breakthroughs, do not this hypotension is septic shock. Think medication + deconditioning  P -cont midodrine and titrate NE -have recommended 1 PRBC as adjunct  given hgb 7.5 and hypotension, pt is contemplating this.   -MAP goal > 65  Afib  CHA2DS2-VASc Score at least 3. P - eliquis -optimize lytes    Anemia -critical illness, iatrogenic losses, chronic dz  P -type and screen -have rec 1 PRBC 5/29, pt is contemplating consent so have not ordered  -PRN CBC and minimize lab draws as able  -will add on an iron supp per  tube   Hyperglycemia - SSI  Urinary retention Recurrent retention after Foley removal 5/24, replaced 5/25. P -bethanecol   At risk constipation -LBM 5/27, on a lot of meds that would decr motility + is more or less immobile  P -cont bowel reg  -mobilize   Physical deconditioning Inadequate PO intake  P -PT/OT, OOB to chair  -EN per RDN, via cortrak -dispo wise, to successfully go home I anticipate will need pretty extensive rehab course +/- other destination as intermediary    Best Practice: (right click and "Reselect all SmartList Selections" daily)   Diet/type: tubefeeds DVT prophylaxis: Eliquis  GI prophylaxis: H2B Lines: Central line - PICC Foley:  Yes Code Status:  full code Last date of multidisciplinary goals of care discussion: Updated patient extensively 5/25 discussed limitation of ventilator dependence from liberating from the ICU and thus going home discussed likely take weeks of rehab before she could return home  CRITICAL CARE Performed by: Lanier Clam   Total critical care time: 37 minutes  Critical care time was exclusive of separately billable procedures and treating other patients. Critical care was necessary to treat or prevent imminent or life-threatening deterioration.  Critical care was time spent personally by me on the following activities: development of treatment plan with patient and/or surrogate as well as nursing, discussions with consultants, evaluation of patient's response to treatment, examination of patient, obtaining history from patient or surrogate, ordering and performing treatments and interventions, ordering and review of laboratory studies, ordering and review of radiographic studies, pulse oximetry and re-evaluation of patient's condition.  Tessie Fass MSN, AGACNP-BC [facility] Pulmonary/Critical Care Medicine Amion for pager 02/08/2023, 8:44 AM

## 2023-02-08 NOTE — Progress Notes (Signed)
   02/08/23 0015  Vent Select  $ Ventilator Initial/Subsequent  Subsequent  Invasive or Noninvasive Invasive  Adult Vent Y  Tracheostomy Shiley Flexible 6 mm Cuffed  Placement Date/Time: 01/27/23 1400   Inserted prior to hospital arrival?: Other (Comment)  Placed By: ICU physician  Brand: Shiley Flexible  Size (mm): 6 mm  Style: Cuffed  Status Secured with trach ties  Site Assessment Clean;Dry  Site Care Protective barrier to skin;Other (Comment) (Remains Intact)  Inner Cannula Care Changed/new  Ties Assessment Clean, Dry  Tracheostomy Equipment at bedside Yes and checklist posted at head of bed  Adult Ventilator Settings  Vent Type Servo i  Humidity HME  Vent Mode SIMV;PRVC;PSV  Vt Set 510 mL  Set Rate 10 bmp  FiO2 (%) 40 %  I Time 0.9 Sec(s)  Pressure Support 10 cmH20  PEEP 8 cmH20  Adult Ventilator Measurements  Peak Airway Pressure 23 L/min  Mean Airway Pressure 12 cmH20  Plateau Pressure 21 cmH20  Resp Rate Spontaneous 7 br/min  Resp Rate Total 17 br/min  Exhaled Vt 569 mL  Measured Ve 8 L  Auto PEEP 0 cmH20  Total PEEP 8 cmH20  Adult Ventilator Alarms  Alarms On Y  Ve High Alarm 20 L/min  Ve Low Alarm 4 L/min  Resp Rate High Alarm 40 br/min  Resp Rate Low Alarm 8  PEEP Low Alarm 3 cmH2O  Press High Alarm 45 cmH2O  Press Low Alarm 3 cmH2O  Delay Alarm 20 sec(s)  VAP Prevention  HOB> 30 Degrees Y  Equipment wiped down Yes  Breath Sounds  Bilateral Breath Sounds Clear;Diminished  R Upper  Breath Sounds Clear  L Upper Breath Sounds Clear  Vent Respiratory Assessment  Patient Effort Good  Respiratory Pattern Regular;Unlabored;Symmetrical  Respiratory Effort Mild  Patient Tolerance Tolerated well  Suction Method  Respiratory Interventions Airway suction  Airway Suctioning/Secretions  Suction Type Tracheal  Suction Device  Catheter  Secretion Amount None  Suction Tolerance Tolerated well  Suctioning Adverse Effects None

## 2023-02-08 NOTE — Progress Notes (Signed)
RT NOTE:  Pt transported to and from radiation via ventilator. BMV at bedside at all times.

## 2023-02-08 NOTE — Progress Notes (Signed)
Occupational Therapy Treatment Patient Details Name: [name] MRN: [id number] DOB: 11/11/1955 Today's Date: 02/08/2023   History of present illness Pt is a 67 y.o. female admitted 01/11/23 with acute hypoxic respiratory failure secondary to post-obstructive RLL PNA. Course complicated by anxiety. Intubated 5/6. Plan for biopsy 5/7. Tracheostomy placed 01/27/23.  PMH includes R hilar mass (concern for recurrent NSCLC), COPD, afib, panic attacks/anxiety.   OT comments  Patient was limited in session today with onset of loose Bms. Patient was +2 for movement at bed level with lines and leads. Patient's discharge plan remains appropriate at this time. OT will continue to follow acutely.     Recommendations for follow up therapy are one component of a multi-disciplinary discharge planning process, led by the attending physician.  Recommendations may be updated based on patient status, additional functional criteria and insurance authorization.    Assistance Recommended at Discharge Frequent or constant Supervision/Assistance  Patient can return home with the following  Two people to help with bathing/dressing/bathroom;Two people to help with walking and/or transfers;Assistance with cooking/housework;Assist for transportation;Help with stairs or ramp for entrance;Direct supervision/assist for financial management   Equipment Recommendations  Tub/shower seat       Precautions / Restrictions Precautions Precautions: Fall Precaution Comments: watch Spo2 , HR, RR, trach,, Cor trac, loose BM's( check) Restrictions Weight Bearing Restrictions: No       Mobility Bed Mobility Overal bed mobility: Needs Assistance Bed Mobility: Rolling Rolling: Mod assist, +2 for safety/equipment         General bed mobility comments: patient initiates rolling to each side, flexes legs  with tactile cues, reaches for rail, unable to sit up due to continuous loose BM            ADL either performed or  assessed with clinical judgement   ADL Overall ADL's : Needs assistance/impaired             General ADL Comments: Patient was lethargic but agreeable to attempt to get to EOB. patient unable to progress to EOB with onset of BM incontinence x2. after patient was cleaned up first time patient then continued to have very loose stool. nurse made aware. HR up to 133 bpm during session. patient was peep of 8 during session      Cognition Arousal/Alertness: Lethargic Behavior During Therapy: WFL for tasks assessed/performed, Flat affect Overall Cognitive Status: Within Functional Limits for tasks assessed           General Comments: on vent, , mouths words, not understandable,  able to nod head yes/no; intermittently alert, then falling asleep, follows 1 step commands inconsistently 2* lethargy                   Pertinent Vitals/ Pain       Pain Assessment Pain Assessment: PAINAD Breathing: occasional labored breathing, short period of hyperventilation Negative Vocalization: occasional moan/groan, low speech, negative/disapproving quality Facial Expression: sad, frightened, frown Body Language: tense, distressed pacing, fidgeting Consolability: distracted or reassured by voice/touch PAINAD Score: 5 Pain Location: points to trach Pain Descriptors / Indicators: Discomfort, Grimacing Pain Intervention(s): Limited activity within patient's tolerance, Monitored during session, Repositioned         Frequency  Min 1X/week        Progress Toward Goals  OT Goals(current goals can now be found in the care plan section)  Progress towards OT goals: OT to reassess next treatment     Plan Discharge plan remains appropriate    Co-evaluation  Reason for Co-Treatment: Complexity of the patient's impairments (multi-system involvement);For patient/therapist safety;To address functional/ADL transfers PT goals addressed during session: Mobility/safety with mobility OT  goals addressed during session: ADL's and self-care      AM-PAC OT "6 Clicks" Daily Activity     Outcome Measure   Help from another person eating meals?: Total Help from another person taking care of personal grooming?: Total Help from another person toileting, which includes using toliet, bedpan, or urinal?: Total Help from another person bathing (including washing, rinsing, drying)?: Total Help from another person to put on and taking off regular upper body clothing?: Total Help from another person to put on and taking off regular lower body clothing?: Total 6 Click Score: 6    End of Session Equipment Utilized During Treatment: Other (comment);Oxygen (trach)  OT Visit Diagnosis: Other abnormalities of gait and mobility (R26.89);Muscle weakness (generalized) (M62.81)   Activity Tolerance Other (comment) (onset of multiple loose stools)   Patient Left in bed;with call bell/phone within reach;with bed alarm set   Nurse Communication Other (comment) (ok to participate in session and called into room for suction during session per patient request)        Time: 1610-9604 OT Time Calculation (min): 23 min  Charges: OT Treatments $Therapeutic Activity: 8-22 mins  Rosalio Loud, MS Acute Rehabilitation Department Office# [phone]   Selinda Flavin 02/08/2023, 1:54 PM

## 2023-02-08 NOTE — Progress Notes (Signed)
Physical Therapy Treatment Patient Details Name: [name] MRN: [id number] DOB: Aug 03, 1956 Today's Date: 02/08/2023   History of Present Illness Pt is a 67 y.o. female admitted 01/11/23 with acute hypoxic respiratory failure secondary to post-obstructive RLL PNA. Course complicated by anxiety. Intubated 5/6. Plan for biopsy 5/7. Tracheostomy placed 01/27/23.  PMH includes R hilar mass (concern for recurrent NSCLC), COPD, afib, panic attacks/anxiety.    PT Comments    The patient aroused  with name calling, patient  did follow simple directions for rolling, keeps eyes closed.  HR [phone] with mobility, SPO2 95 %on vent, drop to 87% with rolling, RN in to suction. RR 20-35.  Patient  initially appeared to be stable to participate in dangling on the bed edge but began to have large amount of loose BM so unable to  dangle.  Continue PT , attempts for sitting bed edge.   Recommendations for follow up therapy are one component of a multi-disciplinary discharge planning process, led by the attending physician.  Recommendations may be updated based on patient status, additional functional criteria and insurance authorization.  Follow Up Recommendations  Can patient physically be transported by private vehicle: No    Assistance Recommended at Discharge Frequent or constant Supervision/Assistance  Patient can return home with the following Assist for transportation;Help with stairs or ramp for entrance;Assistance with cooking/housework;Two people to help with walking and/or transfers;Two people to help with bathing/dressing/bathroom   Equipment Recommendations  None recommended by PT    Recommendations for Other Services       Precautions / Restrictions Precautions Precautions: Fall Precaution Comments: watch Spo2 , HR, RR, trach,, Cor trac, loose BM's( check) Restrictions Weight Bearing Restrictions: No     Mobility  Bed Mobility Overal bed mobility: Needs Assistance Bed  Mobility: Rolling Rolling: Mod assist, +2 for safety/equipment         General bed mobility comments: patient initiates rolling to each side, flexes legs  with tactile cues, reaches for rail, unable to sit up due to continuous loose BM    Transfers                        Ambulation/Gait                   Stairs             Wheelchair Mobility    Modified Rankin (Stroke Patients Only)       Balance                                            Cognition Arousal/Alertness: Lethargic                                     General Comments: on vent, , mouths words, not understandable,  able to nod head yes/no; intermittently alert, then falling asleep, follows 1 step commands inconsistently 2* lethargy        Exercises      General Comments        Pertinent Vitals/Pain Pain Assessment Pain Assessment: PAINAD Breathing: occasional labored breathing, short period of hyperventilation Negative Vocalization: occasional moan/groan, low speech, negative/disapproving quality Facial Expression: sad, frightened, frown Body Language: tense, distressed pacing, fidgeting Consolability: distracted or reassured by voice/touch PAINAD Score: 5 Pain  Location: points to trach    Home Living                          Prior Function            PT Goals (current goals can now be found in the care plan section) Acute Rehab PT Goals Patient Stated Goal: unable PT Goal Formulation: Patient unable to participate in goal setting Time For Goal Achievement: 02/22/23 Potential to Achieve Goals: Fair Progress towards PT goals: Progressing toward goals    Frequency    Min 1X/week      PT Plan Current plan remains appropriate    Co-evaluation PT/OT/SLP Co-Evaluation/Treatment: Yes Reason for Co-Treatment: Complexity of the patient's impairments (multi-system involvement);For patient/therapist safety;To address  functional/ADL transfers PT goals addressed during session: Mobility/safety with mobility OT goals addressed during session: ADL's and self-care      AM-PAC PT "6 Clicks" Mobility   Outcome Measure  Help needed turning from your back to your side while in a flat bed without using bedrails?: Total Help needed moving from lying on your back to sitting on the side of a flat bed without using bedrails?: Total Help needed moving to and from a bed to a chair (including a wheelchair)?: Total Help needed standing up from a chair using your arms (e.g., wheelchair or bedside chair)?: Total Help needed to walk in hospital room?: Total Help needed climbing 3-5 steps with a railing? : Total 6 Click Score: 6    End of Session Equipment Utilized During Treatment:  (vent/trach) Activity Tolerance: Treatment limited secondary to medical complications (Comment) Patient left: in bed;with nursing/sitter in room;with bed alarm set Nurse Communication: Mobility status;Need for lift equipment PT Visit Diagnosis: Other abnormalities of gait and mobility (R26.89);Muscle weakness (generalized) (M62.81)     Time: 0960-4540 PT Time Calculation (min) (ACUTE ONLY): 23 min  Charges:  $Therapeutic Activity: 8-22 mins                     Blanchard Kelch PT Acute Rehabilitation Services Office [phone] Weekend [phone]    Rada Hay 02/08/2023, 12:12 PM

## 2023-02-09 ENCOUNTER — Ambulatory Visit
Admission: RE | Admit: 2023-02-09 | Discharge: 2023-02-09 | Disposition: A | Discharge status: Home / Self Care | Payer: 59 | Source: Ambulatory Visit | Attending: Radiation Oncology | Admitting: Radiation Oncology

## 2023-02-09 ENCOUNTER — Other Ambulatory Visit: Payer: Self-pay

## 2023-02-09 ENCOUNTER — Inpatient Hospital Stay (HOSPITAL_COMMUNITY): Payer: 59

## 2023-02-09 DIAGNOSIS — C3432 Malignant neoplasm of lower lobe, left bronchus or lung: Secondary | ICD-10-CM | POA: Diagnosis not present

## 2023-02-09 DIAGNOSIS — R5381 Other malaise: Secondary | ICD-10-CM

## 2023-02-09 DIAGNOSIS — Z510 Encounter for antineoplastic radiation therapy: Secondary | ICD-10-CM | POA: Diagnosis not present

## 2023-02-09 DIAGNOSIS — J9601 Acute respiratory failure with hypoxia: Secondary | ICD-10-CM | POA: Diagnosis not present

## 2023-02-09 DIAGNOSIS — Z87891 Personal history of nicotine dependence: Secondary | ICD-10-CM | POA: Diagnosis not present

## 2023-02-09 LAB — GLUCOSE, CAPILLARY
Glucose-Capillary: 113 mg/dL — ABNORMAL HIGH (ref 70–99)
Glucose-Capillary: 139 mg/dL — ABNORMAL HIGH (ref 70–99)
Glucose-Capillary: 123 mg/dL — ABNORMAL HIGH (ref 70–99)
Glucose-Capillary: 124 mg/dL — ABNORMAL HIGH (ref 70–99)
Glucose-Capillary: 156 mg/dL — ABNORMAL HIGH (ref 70–99)
Glucose-Capillary: 99 mg/dL (ref 70–99)
Glucose-Capillary: 157 mg/dL — ABNORMAL HIGH (ref 70–99)

## 2023-02-09 LAB — RAD ONC ARIA SESSION SUMMARY
Course Elapsed Days: 22
Plan Fractions Treated to Date: 13
Plan Prescribed Dose Per Fraction: 2 Gy
Plan Total Fractions Prescribed: 27
Plan Total Prescribed Dose: 54 Gy
Reference Point Dosage Given to Date: 26 Gy
Reference Point Session Dosage Given: 2 Gy
Session Number: 16

## 2023-02-09 LAB — CBC
HCT: 24.8 % — ABNORMAL LOW (ref 36.0–46.0)
Hemoglobin: 7.4 g/dL — ABNORMAL LOW (ref 12.0–15.0)
MCH: 26.5 pg (ref 26.0–34.0)
MCHC: 29.8 g/dL — ABNORMAL LOW (ref 30.0–36.0)
MCV: 88.9 fL (ref 80.0–100.0)
Platelets: 337 10*3/uL (ref 150–400)
RBC: 2.79 MIL/uL — ABNORMAL LOW (ref 3.87–5.11)
RDW: 14.8 % (ref 11.5–15.5)
WBC: 8.6 10*3/uL (ref 4.0–10.5)
nRBC: 0 % (ref 0.0–0.2)

## 2023-02-09 LAB — BASIC METABOLIC PANEL
Anion gap: 9 (ref 5–15)
BUN: 26 mg/dL — ABNORMAL HIGH (ref 8–23)
CO2: 28 mmol/L (ref 22–32)
Calcium: 8.6 mg/dL — ABNORMAL LOW (ref 8.9–10.3)
Chloride: 97 mmol/L — ABNORMAL LOW (ref 98–111)
Creatinine, Ser: 0.83 mg/dL (ref 0.44–1.00)
GFR, Estimated: >60 mL/min (ref >60)
Glucose, Bld: 128 mg/dL — ABNORMAL HIGH (ref 70–99)
Potassium: 4.4 mmol/L (ref 3.5–5.1)
Sodium: 134 mmol/L — ABNORMAL LOW (ref 135–145)

## 2023-02-09 LAB — MAGNESIUM: Magnesium: 2.2 mg/dL (ref 1.7–2.4)

## 2023-02-09 MED ORDER — DIPHENHYDRAMINE HCL 50 MG/ML IJ SOLN
50.0000 mg | Freq: Once | INTRAMUSCULAR | Status: AC
  Administered 2023-02-09: 50 mg via INTRAVENOUS
  Filled 2023-02-09: qty 1

## 2023-02-09 MED ORDER — HYDROMORPHONE HCL 1 MG/ML IJ SOLN: 0.5000 mg | INTRAMUSCULAR | Status: DC | PRN

## 2023-02-09 MED ORDER — HYDROCODONE BIT-HOMATROP MBR 5-1.5 MG/5ML PO SOLN
5.0000 mL | ORAL | Status: DC | PRN
  Administered 2023-02-12: 5 mL via ORAL
  Filled 2023-02-09: qty 5

## 2023-02-09 MED ORDER — BUSPIRONE HCL 10 MG PO TABS
10.0000 mg | ORAL_TABLET | Freq: Two times a day (BID) | ORAL | Status: DC
  Administered 2023-02-09 – 2023-02-11 (×6): 10 mg
  Filled 2023-02-09 (×6): qty 1

## 2023-02-09 MED ORDER — HYDROMORPHONE HCL 1 MG/ML IJ SOLN
0.5000 mg | INTRAMUSCULAR | Status: DC | PRN
  Administered 2023-02-09: 1 mg via INTRAVENOUS
  Administered 2023-02-10: 0.5 mg via INTRAVENOUS
  Administered 2023-02-10 – 2023-02-25 (×27): 1 mg via INTRAVENOUS
  Filled 2023-02-09 (×33): qty 1

## 2023-02-09 MED ORDER — HYDROXYZINE HCL 10 MG/5ML PO SYRP
50.0000 mg | ORAL_SOLUTION | Freq: Three times a day (TID) | ORAL | Status: DC | PRN
  Administered 2023-02-10 – 2023-02-17 (×9): 50 mg
  Filled 2023-02-09 (×16): qty 25

## 2023-02-09 NOTE — Progress Notes (Signed)
eLink Physician-Brief Progress Note Patient Name: KHALI STARKOVICH DOB: 1956-02-02 MRN: [id number]   Date of Service  02/09/2023  HPI/Events of Note  Received request for cough suppressant to be given via tube.  Pt has a trache in place and is on the vent.   eICU Interventions  Hycodan ordered PRN.      Intervention Category Minor Interventions: Routine modifications to care plan (e.g. PRN medications for pain, fever)  Larinda Buttery 02/09/2023, 5:28 AM

## 2023-02-09 NOTE — Progress Notes (Signed)
SLP Cancellation Note  Patient Details Name: [name] MRN: [id number] DOB: 01/15/1956   Cancelled treatment:       Reason Eval/Treat Not Completed: Other (comment) (note events from earlier after XRT with pt having air hunger and anxiety, will continue efforts for PMSV as pt able)   Chales Abrahams 02/09/2023, 2:37 PM

## 2023-02-09 NOTE — Progress Notes (Addendum)
Returned from radiation  Similar situation as yesterday, very anxious and incr RR after returning from radiation  Isnt hypoxic and there isnt a change in resp exam   Think this unfortunately is a positive feedback loop re her air hunger (which I do not think we can fix) increasing with repositioning, and that sensation driving worse anxiety. Is back on precedex and has received PRN versed without much impact.    In immediate sense, will order for a 1x of benadryl & incr ceiling on precedex, wean back down as able. Next dose of [facility] dilaudid is due, which may help some. Ultimately, goal is to wean off precedex. Will order for [facility] buspar and PRN atarax. Wondering if anxiety is building before radiation/at the start of -- might be worth incr PRN dose before going down to see if helps   Tessie Fass MSN, AGACNP-BC [facility] Pulmonary/Critical Care Medicine 02/09/2023, 11:50 AM

## 2023-02-09 NOTE — Progress Notes (Signed)
Nutrition Follow-up  DOCUMENTATION CODES:   Severe malnutrition in context of acute illness/injury  INTERVENTION:  - Continue goal TF: Osmolite 1.5 @ 55 ml/hr (1320 ml/day) PROSource TF20 60 ml daily - Provides 2060 kcal, 103 grams of protein, and 1006 ml of H2O.   - Continue Banatrol TF BID to aid in bulking of stools. Will put stop date of 6/1 for total of 10 days.  - If diarrhea worsens can restart for another 5 days.  - FWF per CCM/MD.   - MVI with minerals daily per tube.   NUTRITION DIAGNOSIS:   Severe Malnutrition related to acute illness as evidenced by moderate fat depletion, moderate muscle depletion. *ongoing  GOAL:   Patient will meet greater than or equal to 90% of their needs *met with TF  MONITOR:   Vent status, Labs, Weight trends, TF tolerance  REASON FOR ASSESSMENT:   Ventilator, Consult Enteral/tube feeding initiation and management  ASSESSMENT:   67 y.o. female with PMHx including adenocarcinoma of LLL now with recurrence, COPD, and anxiety presents with acute on chronic hypoxemic respiratory failure likely multifactorial  5/2 Admit 5/6 Intubated; Osm 1.5 started at 54mL/hr 5/8 Osm 1.5 increased to goal of 9mL/hr 5/15 Extubated by quickly re-intubated; TF restarted at 92mL/hr due to xray showing stomach distension 5/17 s/p trach  Weaning trials remain ongoing. Patient has been having anxiety which has limited progress on wean.     Patient tolerating Osmolite 1.5 @ 55 ml/hr.   Rectal tube removed 5/25. Having type 6 to type 7 BM. Has been on Banatrol since 5/23. Only type 6 bowel movements yesterday, none so far today. Will continue Banatrol for another 2 days for total admit duration of 10 days.  If diarrhea worsens again, can restart.   Admission weight: 174 lbs Current weight: 180 lbs  Patient experiencing generalized edema which could be affecting current weight status  Medications reviewed and include: 300mg  ferrous sulfate, MVI,  Remeron, Banatrol BID (started 5/23)  Labs reviewed:  Na 134   Diet Order:   Diet Order             Diet NPO time specified  Diet effective ____                   EDUCATION NEEDS:  Education needs have been addressed  Skin:  Skin Assessment: Reviewed RN Assessment  Last BM:  5/29  Height:  Ht Readings from Last 1 Encounters:  01/31/23 5\' 8"  (1.727 m)   Weight:  Wt Readings from Last 1 Encounters:  02/09/23 81.8 kg    BMI:  Body mass index is 27.42 kg/m.  Estimated Nutritional Needs:  Kcal:  1900-2100 Protein:  95-120 g Fluid:  >1.9 L    Shelle Iron RD, LDN For contact information, refer to [facility].

## 2023-02-09 NOTE — Progress Notes (Signed)
Pt transported to radiation on LTV vent and back to ICU on LTV vent with no complications. Once back to room pt placed back on SERVO-I with increase WOB and respiratory rate. RN aware of pt condition.

## 2023-02-09 NOTE — Progress Notes (Addendum)
NAME:  [name], MRN:  [id number], DOB:  [birth year], LOS: 28 ADMISSION DATE:  01/11/2023, CONSULTATION DATE:  01/16/2023 REFERRING MD: FMTS, CHIEF COMPLAINT: Acute Respiratory Failure in the setting of Lung Cancer, COPD and infiltrate on CXR   History of Present Illness:  67 year old woman who presented to [facility] 5/1 for nausea/vomiting/diarrhea and abdominal pain x 2 weeks. PMHx significant for COPD, lung CA (adenocarcinoma of LLL, biopsy planned for 5/7), bladder CA (s/p TURBT), GERD, and panic/anxiety. Admitted for AHRF in setting of CAP/COPD exacerbation to FMTS at [facility].   Found to have postobstructive PNA (RML/RLL) and R hilar mass with progressive respiratory failure requiring intubation 5/6. Patient also experienced panic attacks/anxiety leading to exacerbated WOB and VCD with associated stridor. Of note, patient is a DNR; however she endorses that she is not ready to die and wanted to be intubated. (See Prior Note documentation 01/16/2023). Transferred to [facility] 5/7 for ongoing oncological care.  Pertinent Medical History:   Past Medical History:  Diagnosis Date   Abnormal CT lung screening 07/10/2020   Anxiety    Anxiety state 02/28/2012   GAD7 : 6  PHQ9 : 6   Arthritis    Bladder cancer (HCC) 09/2020   urologist--- dr Berneice Heinrich, incidental finding on PET scan 12/ 2021,  s/p TURBT 09-18-2020 high grade papillary urothelial    Cancer related pain 03/11/2022   CAP (community acquired pneumonia) 04/20/2022   Centrilobular emphysema (HCC)    Chronic left shoulder pain 04/29/2021   Close exposure to COVID-19 virus 05/03/2019   Constipation 04/09/2019   Costochondritis, acute 08/14/2020   DDD (degenerative disc disease), lumbar    Depression    DOE (dyspnea on exertion)    10-29-2020  per pt can do house work without sob, when walks/ stairs stops frequently  (stated recovers quickly when stops)   Emphysema lung (HCC)    Epidermoid cyst 10/29/2018   Recurrent. Located on left hip at iliac  crest 2" anterior to midaxillary line. Removed 2013, 2020, 2022.    Epigastric pain 11/16/2020   Fibroid 2003   GERD (gastroesophageal reflux disease)    History of radiation therapy    Left lung- 10/06/20-10/16/20- Dr. Antony Blackbird   Leg cramping 11/13/2019   Lumbar spine pain 01/09/2015   Menopausal vaginal dryness 11/13/2019   Multiple closed fractures of ribs of left side 08/20/2022   Nocturnal enuresis 06/23/2020   Chronic overnight "leaking" of unknown source. Likely urine, but reports no color or odor. Volume enough for liners or light pads. Occurs every night.    Primary adenocarcinoma of lower lobe of left lung (HCC) 07/2020   oncologist--- dr Arbutus Ped---  dx 11/ 2021 bilateral lower lobe masses,  s/p bronchoscopy w/ bx's 07-28-2020 malignancy cells on left ;  completed SBRT bilateral lower lung 10-16-2020 5 fractions   Radiation-induced dermatitis    on 10-29-2020 pt complaint stomach and chest areas,  completed SBRT 10-16-2020   Seasonal allergies    Stage 3 severe COPD by GOLD classification [facility])    pulmonologist--- dr Tonia Brooms,  using anoro inhaler daily   Wears glasses    Significant Hospital Events: Including procedures, antibiotic start and stop dates in addition to other pertinent events   01/16/2023 Transferred to ICU for acute Respiratory Failure , intubated  5/7 Bronch by Icard. significant extrinsic compression of the airways in the right lower lobe and middle lobe. This is likely malignant obstruction. Bx sent. Transferred to [facility] for rad/onc eval. PICC placed 5/8 Stable still  high PEEP/FiO2 needs. Rad/onc consult requested  5/10 Tolerating SBT trial this AM on low-dose fentanyl and Precedex.  Propofol DC'd due to high triglycerides 5/11 Tolerates SBT's, pressure support 10/5 5/12 Off of pressors. Remains on vent, sedated with Precedex/Fentanyl. PMT consulted. 5/13 Weaning on PSV 14/8. > 10L positive over the course of admission. Diuresing. 5/14 Weaning on PSV. Ongoing  diuresis. 5/15 Tolerating SBT, PSV 5/5, following commands. Continue diuresis, Foley placed for persistent urinary retention. Extubated, decompensated quickly after requiring reintubation. DHT placed. 5/16 Agitated overnight, c/o throat pain with ETT, RASS 1-2 despite sedation. Failed extubation. Tolerating SBT PSV 10/5, FiO2 35%. ~1630 desaturated with inability to pass Ballard suction/difficulty bagging; urgent tube exchange completed with bougie and blood clot removed with improvement in oxygenation/ventilation. 5/17 Tracheostomy placed 5/19 Back in atrial fibrillation in am, HR 150s 5/20 Weaned on pressure support 10/5 5/21 Desaturated during weaning. Still on dex and fent gtt 5/23 added fent patch and seroquel had to have NE gtt and midodrine started for drug related hypotension 5/24 Trialing fent gtt off. Foley removed 5/25 3 hrs. trach collar 5/26 Pressure support minimal trach collar 5/28 SIMV/PS, increased pain. Changed oxy fent patch to dilaudid incr seroquel 5/29 radiation. Off dilaudid gtt. Lower dose precedex requirement  5/30 try to wean precedex   Interim History / Subjective:   NAEO Off pressors   Objective:  Blood pressure (!) 100/59, pulse 84, temperature 98.1 F (36.7 C), temperature source Oral, resp. rate 14, height 5\' 8"  (1.727 m), weight 81.8 kg, SpO2 99 %.    Vent Mode: SIMV;PSV FiO2 (%):  [40 %-100 %] 40 % Set Rate:  [10 bmp] 10 bmp Vt Set:  [510 mL] 510 mL PEEP:  [8 cmH20] 8 cmH20 Pressure Support:  [10 cmH20] 10 cmH20 Plateau Pressure:  [14 cmH20-22 cmH20] 22 cmH20   Intake/Output Summary (Last 24 hours) at 02/09/2023 0944 Last data filed at 02/09/2023 1610 Gross per 24 hour  Intake 1565.68 ml  Output 975 ml  Net 590.68 ml   Filed Weights   02/07/23 0500 02/08/23 0335 02/09/23 0500  Weight: 80.6 kg 79.7 kg 81.8 kg   Physical Examination: General: chronically ill older adult trach vent HEENT: Cortrak, trach secure  Neuro: Drowsy, awakens to voice    CV: rr cap refill brisk  PULM: Mechanically ventilated, symmetrical chest expansion  GI: soft round  GU: foley  Extremities: RUE PICC  Skin: skin c/d/w   Resolved Hospital Problem List:  Hypertriglyceridemia due to propofol Hyponatremia Hypotension, Drug induced from sedation  Vocal cord edema  Post obstructive PNA: Zosyn completed 5/13  Assessment & Plan:    LLL adenocarcinoma Acute respiratory failure w hypoxia, now s/p trach  R hilar mass -- associated post obstructive PNA and atelectasis COPD Small bilat pleural effusions   P -cont vent weaning -- goal trach collar, anxiety (probably air hunger driven) has limited progress, vent precludes progression from ICU  -routine trach care  -VAP pulm hygiene, mobility  -brovana yupelri  pulmicort  - xrt per rad onc   Anxiety Chronic pain Depression Acute encephalopathy / delirium  -think air hunger is driving a lot of anxiety  P -wean precedex as able  [facility] dilaudid to 1mg  q4 hr -clonazepam,seroquel, remeron   - neurontin, robaxin, celexa  -qtc permitting, there is room to incr some of these if needed   Hypotension  do not this hypotension is septic shock. Think medication + deconditioning  P -cont midodrine -dc NE   Afib  CHA2DS2-VASc  Score at least 3. P - eliquis -optimize lytes    Anemia -critical illness, iatrogenic losses, chronic dz  P -type and screen -PRN CBC and minimize lab draws as able  -will add on an iron supp per tube   Hyperglycemia - SSI  Urinary retention Recurrent retention after Foley removal 5/24, replaced 5/25. P -bethanecol  -voiding trial soon   Physical deconditioning Inadequate PO intake  P -PT/OT, OOB to chair  -EN per RDN, via cortrak -- think we might have to start thinking about PEG pending GOC  -dispo wise, to successfully go home I anticipate will need pretty extensive rehab course +/- other destination as intermediary    Best Practice: (right click and "Reselect  all SmartList Selections" daily)   Diet/type: tubefeeds DVT prophylaxis: Eliquis  GI prophylaxis: H2B Lines: Central line - PICC Foley:  Yes Code Status:  full code Last date of multidisciplinary goals of care discussion: Updated patient extensively 5/25 discussed limitation of ventilator dependence from liberating from the ICU and thus going home discussed likely take weeks of rehab before she could return home  CRITICAL CARE Performed by: Lanier Clam   Total critical care time: 35 minutes  Critical care time was exclusive of separately billable procedures and treating other patients.  Critical care was necessary to treat or prevent imminent or life-threatening deterioration.  Critical care was time spent personally by me on the following activities: development of treatment plan with patient and/or surrogate as well as nursing, discussions with consultants, evaluation of patient's response to treatment, examination of patient, obtaining history from patient or surrogate, ordering and performing treatments and interventions, ordering and review of laboratory studies, ordering and review of radiographic studies, pulse oximetry and re-evaluation of patient's condition.  Tessie Fass MSN, AGACNP-BC [facility] Pulmonary/Critical Care Medicine Amion for pager  02/09/2023, 9:44 AM

## 2023-02-10 ENCOUNTER — Ambulatory Visit
Admission: RE | Admit: 2023-02-10 | Discharge: 2023-02-10 | Disposition: A | Discharge status: Home / Self Care | Payer: 59 | Source: Ambulatory Visit | Attending: Radiation Oncology | Admitting: Radiation Oncology

## 2023-02-10 ENCOUNTER — Other Ambulatory Visit: Payer: Self-pay

## 2023-02-10 DIAGNOSIS — Z87891 Personal history of nicotine dependence: Secondary | ICD-10-CM | POA: Diagnosis not present

## 2023-02-10 DIAGNOSIS — C3432 Malignant neoplasm of lower lobe, left bronchus or lung: Secondary | ICD-10-CM | POA: Diagnosis not present

## 2023-02-10 DIAGNOSIS — J9601 Acute respiratory failure with hypoxia: Secondary | ICD-10-CM | POA: Diagnosis not present

## 2023-02-10 DIAGNOSIS — Z510 Encounter for antineoplastic radiation therapy: Secondary | ICD-10-CM | POA: Diagnosis not present

## 2023-02-10 LAB — GLUCOSE, CAPILLARY
Glucose-Capillary: 134 mg/dL — ABNORMAL HIGH (ref 70–99)
Glucose-Capillary: 121 mg/dL — ABNORMAL HIGH (ref 70–99)
Glucose-Capillary: 107 mg/dL — ABNORMAL HIGH (ref 70–99)
Glucose-Capillary: 112 mg/dL — ABNORMAL HIGH (ref 70–99)
Glucose-Capillary: 101 mg/dL — ABNORMAL HIGH (ref 70–99)
Glucose-Capillary: 115 mg/dL — ABNORMAL HIGH (ref 70–99)

## 2023-02-10 LAB — MAGNESIUM: Magnesium: 2.5 mg/dL — ABNORMAL HIGH (ref 1.7–2.4)

## 2023-02-10 LAB — CBC
HCT: 26 % — ABNORMAL LOW (ref 36.0–46.0)
Hemoglobin: 7.8 g/dL — ABNORMAL LOW (ref 12.0–15.0)
MCH: 26.6 pg (ref 26.0–34.0)
MCHC: 30 g/dL (ref 30.0–36.0)
MCV: 88.7 fL (ref 80.0–100.0)
Platelets: 425 10*3/uL — ABNORMAL HIGH (ref 150–400)
RBC: 2.93 MIL/uL — ABNORMAL LOW (ref 3.87–5.11)
RDW: 14.8 % (ref 11.5–15.5)
WBC: 6.9 10*3/uL (ref 4.0–10.5)
nRBC: 0 % (ref 0.0–0.2)

## 2023-02-10 LAB — RAD ONC ARIA SESSION SUMMARY
Course Elapsed Days: 23
Plan Fractions Treated to Date: 14
Plan Prescribed Dose Per Fraction: 2 Gy
Plan Total Fractions Prescribed: 27
Plan Total Prescribed Dose: 54 Gy
Reference Point Dosage Given to Date: 28 Gy
Reference Point Session Dosage Given: 2 Gy
Session Number: 17

## 2023-02-10 MED ORDER — LIDOCAINE 5 % EX PTCH
1.0000 | MEDICATED_PATCH | Freq: Two times a day (BID) | CUTANEOUS | Status: DC | PRN
  Administered 2023-02-10: 1 via TRANSDERMAL
  Filled 2023-02-10: qty 1

## 2023-02-10 MED ORDER — LIP MEDEX EX OINT
TOPICAL_OINTMENT | CUTANEOUS | Status: DC | PRN
  Filled 2023-02-10 (×3): qty 7

## 2023-02-10 NOTE — Progress Notes (Signed)
Pt placed on wean 12/8 40% with no complications at this time.

## 2023-02-10 NOTE — Progress Notes (Signed)
   02/10/23 1355  Spiritual Encounters  Type of Visit Initial  Care provided to: Pt and family  Referral source Chaplain team  Reason for visit Routine spiritual support  OnCall Visit No   Chaplain visited with patient and family inquiring about their well being. We prayed for [name] and her health and I wished them well as I departed.   Valerie Roys [facility]  [phone]

## 2023-02-10 NOTE — Progress Notes (Signed)
NAME:  [name], MRN:  [id number], DOB:  07/29/1956, LOS: 29 ADMISSION DATE:  01/11/2023, CONSULTATION DATE:  01/16/2023 REFERRING MD: FMTS, CHIEF COMPLAINT: Acute Respiratory Failure in the setting of Lung Cancer, COPD and infiltrate on CXR   History of Present Illness:  67 year old woman who presented to [facility] 5/1 for nausea/vomiting/diarrhea and abdominal pain x 2 weeks. PMHx significant for COPD, lung CA (adenocarcinoma of LLL, biopsy planned for 5/7), bladder CA (s/p TURBT), GERD, and panic/anxiety. Admitted for AHRF in setting of CAP/COPD exacerbation to FMTS at [facility].   Found to have postobstructive PNA (RML/RLL) and R hilar mass with progressive respiratory failure requiring intubation 5/6. Patient also experienced panic attacks/anxiety leading to exacerbated WOB and VCD with associated stridor. Of note, patient is a DNR; however she endorses that she is not ready to die and wanted to be intubated. (See Prior Note documentation 01/16/2023). Transferred to [facility] 5/7 for ongoing oncological care.  Pertinent Medical History:   Past Medical History:  Diagnosis Date   Abnormal CT lung screening 07/10/2020   Anxiety    Anxiety state 02/28/2012   GAD7 : 6  PHQ9 : 6   Arthritis    Bladder cancer (HCC) 09/2020   urologist--- dr Berneice Heinrich, incidental finding on PET scan 12/ 2021,  s/p TURBT 09-18-2020 high grade papillary urothelial    Cancer related pain 03/11/2022   CAP (community acquired pneumonia) 04/20/2022   Centrilobular emphysema (HCC)    Chronic left shoulder pain 04/29/2021   Close exposure to COVID-19 virus 05/03/2019   Constipation 04/09/2019   Costochondritis, acute 08/14/2020   DDD (degenerative disc disease), lumbar    Depression    DOE (dyspnea on exertion)    10-29-2020  per pt can do house work without sob, when walks/ stairs stops frequently  (stated recovers quickly when stops)   Emphysema lung (HCC)    Epidermoid cyst 10/29/2018   Recurrent. Located on left hip at iliac  crest 2" anterior to midaxillary line. Removed 2013, 2020, 2022.    Epigastric pain 11/16/2020   Fibroid 2003   GERD (gastroesophageal reflux disease)    History of radiation therapy    Left lung- 10/06/20-10/16/20- Dr. Antony Blackbird   Leg cramping 11/13/2019   Lumbar spine pain 01/09/2015   Menopausal vaginal dryness 11/13/2019   Multiple closed fractures of ribs of left side 08/20/2022   Nocturnal enuresis 06/23/2020   Chronic overnight "leaking" of unknown source. Likely urine, but reports no color or odor. Volume enough for liners or light pads. Occurs every night.    Primary adenocarcinoma of lower lobe of left lung (HCC) 07/2020   oncologist--- dr Arbutus Ped---  dx 11/ 2021 bilateral lower lobe masses,  s/p bronchoscopy w/ bx's 07-28-2020 malignancy cells on left ;  completed SBRT bilateral lower lung 10-16-2020 5 fractions   Radiation-induced dermatitis    on 10-29-2020 pt complaint stomach and chest areas,  completed SBRT 10-16-2020   Seasonal allergies    Stage 3 severe COPD by GOLD classification [facility])    pulmonologist--- dr Tonia Brooms,  using anoro inhaler daily   Wears glasses    Significant Hospital Events: Including procedures, antibiotic start and stop dates in addition to other pertinent events   01/16/2023 Transferred to ICU for acute Respiratory Failure , intubated  5/7 Bronch by Icard. significant extrinsic compression of the airways in the right lower lobe and middle lobe. This is likely malignant obstruction. Bx sent. Transferred to [facility] for rad/onc eval. PICC placed 5/8 Stable still  high PEEP/FiO2 needs. Rad/onc consult requested  5/10 Tolerating SBT trial this AM on low-dose fentanyl and Precedex.  Propofol DC'd due to high triglycerides 5/11 Tolerates SBT's, pressure support 10/5 5/12 Off of pressors. Remains on vent, sedated with Precedex/Fentanyl. PMT consulted. 5/13 Weaning on PSV 14/8. > 10L positive over the course of admission. Diuresing. 5/14 Weaning on PSV. Ongoing  diuresis. 5/15 Tolerating SBT, PSV 5/5, following commands. Continue diuresis, Foley placed for persistent urinary retention. Extubated, decompensated quickly after requiring reintubation. DHT placed. 5/16 Agitated overnight, c/o throat pain with ETT, RASS 1-2 despite sedation. Failed extubation. Tolerating SBT PSV 10/5, FiO2 35%. ~1630 desaturated with inability to pass Ballard suction/difficulty bagging; urgent tube exchange completed with bougie and blood clot removed with improvement in oxygenation/ventilation. 5/17 Tracheostomy placed 5/19 Back in atrial fibrillation in am, HR 150s 5/20 Weaned on pressure support 10/5 5/21 Desaturated during weaning. Still on dex and fent gtt 5/23 added fent patch and seroquel had to have NE gtt and midodrine started for drug related hypotension 5/24 Trialing fent gtt off. Foley removed 5/25 3 hrs. trach collar 5/26 Pressure support minimal trach collar 5/28 SIMV/PS, increased pain. Changed oxy fent patch to dilaudid incr seroquel 5/29 radiation. Off dilaudid gtt. Lower dose precedex requirement  5/30 try to wean precedex. Repeat anxiety during/after rad. Incr anxiolytics Trying incr premedication for rad  Interim History / Subjective:  NAEO    Objective:  Blood pressure (!) 167/150, pulse 97, temperature 98 F (36.7 C), temperature source Axillary, resp. rate (!) 27, height 5\' 8"  (1.727 m), weight 81.1 kg, SpO2 96 %.    Vent Mode: SIMV;PSV FiO2 (%):  [40 %] 40 % Set Rate:  [10 bmp] 10 bmp Vt Set:  [510 mL] 510 mL PEEP:  [8 cmH20] 8 cmH20 Pressure Support:  [10 cmH20-12 cmH20] 12 cmH20 Plateau Pressure:  [15 cmH20-25 cmH20] 25 cmH20   Intake/Output Summary (Last 24 hours) at 02/10/2023 1040 Last data filed at 02/10/2023 0908 Gross per 24 hour  Intake 2140.36 ml  Output 1750 ml  Net 390.36 ml   Filed Weights   02/08/23 0335 02/09/23 0500 02/10/23 0500  Weight: 79.7 kg 81.8 kg 81.1 kg   Physical Examination: General: chronically ill trach  vent asleep  HEENT: Cortrak, trach  Neuro: Asleep, awakens to voice   CV: rr cap refill brisk  PULM: Mechanically ventilated. GI: soft round  GU: foley  Extremities: RUE picc  Skin: c/d/w no rash   Resolved Hospital Problem List:  Hypertriglyceridemia due to propofol Hyponatremia Hypotension, Drug induced from sedation  Vocal cord edema  Post obstructive PNA: Zosyn completed 5/13  Assessment & Plan:    LLL adenocarcinoma R hilar mass, post obstructive pna and atelectasis  Acute resp failure hypoxia Trach/vent dependence  COPD Small bilateral pleural effusions  P -cont vent weaning as able -- goal trach collar, anxiety (probably air hunger driven) has limited progress, at times not tolerating even brief wean  -routine trach care  -VAP pulm hygiene, mobility  -brovana yupelri  pulmicort  - xrt per rad onc   Anxiety Chronic pain Depression Acute encephalopathy / delirium  -think air hunger is driving a lot of anxiety  P -wean precedex as able -- this is not a sustainable plan -has SCH BZD Buspar celexa remeron seroquel, PRN BZD atarax  & indications for PRN dilaudid have been changed to include air hunger  -can look at incr seroquel if needed    Hypotension  Think medication + deconditioning, not sepsis  P -cont midodrine  Afib -- sinus now P - eliquis -optimize lytes, think we can do PRN BMP   Anemia -critical illness, iatrogenic losses, chronic dz  P -type and screen -PRN CBC and minimize lab draws as able  -cont Fe supp per tube   Hyperglycemia - SSI  Urinary retention Recurrent retention after Foley removal 5/24, replaced 5/25. P -bethanecol  -void trail soon, will discuss w RN   Physical deconditioning Inadequate PO intake  P -PT/OT, OOB to chair  -EN per RDN, via cortrak -- think we might have to start thinking about PEG pending GOC  -family/pt has remained steadfast w goal to go home. Road to get there is quite long and would require  extensive rehab etc. Worry greatly about her anxiety/air hunger precluding vent weaning.    Best Practice: (right click and "Reselect all SmartList Selections" daily)   Diet/type: tubefeeds DVT prophylaxis: Eliquis  GI prophylaxis: H2B Lines: Central line - PICC Foley:  Yes Code Status:  full code Last date of multidisciplinary goals of care discussion: 5/25   CRITICAL CARE Performed by: Lanier Clam   Total critical care time: 36 minutes  Critical care time was exclusive of separately billable procedures and treating other patients. Critical care was necessary to treat or prevent imminent or life-threatening deterioration.  Critical care was time spent personally by me on the following activities: development of treatment plan with patient and/or surrogate as well as nursing, discussions with consultants, evaluation of patient's response to treatment, examination of patient, obtaining history from patient or surrogate, ordering and performing treatments and interventions, ordering and review of laboratory studies, ordering and review of radiographic studies, pulse oximetry and re-evaluation of patient's condition.  Tessie Fass MSN, AGACNP-BC [facility] Pulmonary/Critical Care Medicine Amion for pager  02/10/2023, 10:40 AM

## 2023-02-10 NOTE — TOC Progression Note (Signed)
Transition of Care [facility]) - Progression Note    Patient Details  Name: [name] MRN: [id number] Date of Birth: 1956/05/06  Transition of Care [facility]) CM/SW Contact  Lavenia Atlas, RN Phone Number: 02/10/2023, 3:36 PM  Clinical Narrative:   Per chart review patient has received radiation therapy. This RNCM offered choice for potential LTAC, post radiation therapy. [name] [name] chose [facility] for [employer] This RNCM explained to [place] that LTAC will not accept patient while on radiation, Tonya verbalized understanding. Notified Victorino Dike w/ [facility] development [facility].  TOC will continue to follow for needs.    Expected Discharge Plan: Long Term Acute Care (LTAC) Barriers to Discharge: Continued Medical Work up  Expected Discharge Plan and Services In-house Referral: Chaplain, Hospice / Palliative Care Discharge Planning Services: CM Consult Post Acute Care Choice: Long Term Acute Care (LTAC) Living arrangements for the past 2 months: Apartment Expected Discharge Date: 01/14/23               DME Arranged: N/A DME Agency: NA       HH Arranged: NA HH Agency: NA         Social Determinants of Health (SDOH) Interventions SDOH Screenings   Food Insecurity: No Food Insecurity (01/13/2023)  Housing: Low Risk  (01/13/2023)  Transportation Needs: No Transportation Needs (01/13/2023)  Utilities: Not At Risk (01/13/2023)  Alcohol Screen: Low Risk  (12/13/2022)  Depression (PHQ2-9): Low Risk  (12/13/2022)  Financial Resource Strain: Low Risk  (12/13/2022)  Physical Activity: Inactive (12/13/2022)  Social Connections: Moderately Integrated (12/13/2022)  Stress: No Stress Concern Present (12/13/2022)  Tobacco Use: Medium Risk (01/22/2023)    Readmission Risk Interventions    02/07/2023    2:14 PM 02/01/2023   12:07 PM  Readmission Risk Prevention Plan  Transportation Screening Complete Complete  PCP or Specialist Appt within 3-5 Days  Complete  HRI or Home Care Consult  Complete   Social Work Consult for Recovery Care Planning/Counseling  Complete  Palliative Care Screening  Complete  Medication Review Oceanographer) Complete Complete  PCP or Specialist appointment within 3-5 days of discharge Complete   HRI or Home Care Consult Complete   SW Recovery Care/Counseling Consult Complete   Palliative Care Screening Complete   Skilled Nursing Facility Complete

## 2023-02-10 NOTE — Progress Notes (Signed)
Physical Therapy Treatment Patient Details Name: [name] MRN: [id number] DOB: 04-30-1956 Today's Date: 02/10/2023   History of Present Illness 67 year old woman who presented to [facility] 5/1 for nausea/vomiting/diarrhea and abdominal pain x 2 weeks. PMHx significant for COPD, lung CA (adenocarcinoma of LLL, biopsy planned for 5/7), bladder CA (s/p TURBT), GERD, and panic/anxiety. Admitted for AHRF in setting of CAP/COPD exacerbation to FMTS at [facility].    PT Comments    General Comments: remains on VENT, attempts to mouth words but not understandable.  Attempts to write but unable to fully grasp pen/fat marker.  Attempted communication board but pt unable to pin point which box.  Following all commands.  Good eye contact.  Participated in TE's. With RN and NT, assisted OOB to recliner via MAXI MOVE LIFT. Performed AAROM/TE's B LE and B UE's.     Recommendations for follow up therapy are one component of a multi-disciplinary discharge planning process, led by the attending physician.  Recommendations may be updated based on patient status, additional functional criteria and insurance authorization.  Follow Up Recommendations  Can patient physically be transported by private vehicle: No    Assistance Recommended at Discharge Frequent or constant Supervision/Assistance  Patient can return home with the following Assist for transportation;Help with stairs or ramp for entrance;Assistance with cooking/housework;Two people to help with walking and/or transfers;Two people to help with bathing/dressing/bathroom   Equipment Recommendations  None recommended by PT    Recommendations for Other Services       Precautions / Restrictions Precautions Precautions: Fall Precaution Comments: watch Spo2 , HR, RR, trach,, Cor trac, loose BM's( check) Restrictions Weight Bearing Restrictions: No     Mobility  Bed Mobility Overal bed mobility: Needs Assistance Bed Mobility: Rolling Rolling: Max  assist, +2 for physical assistance, +2 for safety/equipment         General bed mobility comments: patient initiates rolling to each side, flexes legs  with AAROM, reaches for rail but unable to fully grasp, attempts to sit up active assist.    Transfers Overall transfer level: Needs assistance                 General transfer comment: with RN and NT assisted OOB to recliner via [employer] Transfer via Lift Equipment: Maximove  Ambulation/Gait                   Stairs             Wheelchair Mobility    Modified Rankin (Stroke Patients Only)       Balance                                            Cognition Arousal/Alertness: Awake/alert   Overall Cognitive Status: Within Functional Limits for tasks assessed                                 General Comments: remains on VENT, attempts to mouth words but not understandable.  Attempts to write but unable to fully grasp pen/fat marker.  Attempted communication board but pt unable to pin point which box.  Following all commands.  Good eye contact.  Participated in TE's.        Exercises  EXERCISES while in recliner:  LE TE's  15 resp AP AAROM with resistance  on PF  10 reps heels slides with AAROM hip/knee flexion and resistance with extension  10 reps hip ADd towel squeezes with a 3 second hold  05 reps knee presses with a 3 second hold  UE TE's 05 reps shoulder shrugs 05 reps elbow extension with resistance 05 reps elbow ABD  Trunk 05 reps long sitting     General Comments  Pt fatigues easily      Pertinent Vitals/Pain Pain Assessment Pain Assessment: No/denies pain    Home Living                          Prior Function            PT Goals (current goals can now be found in the care plan section) Progress towards PT goals: Progressing toward goals    Frequency    Min 1X/week      PT Plan Current plan remains appropriate     Co-evaluation              AM-PAC PT "6 Clicks" Mobility   Outcome Measure  Help needed turning from your back to your side while in a flat bed without using bedrails?: Total Help needed moving from lying on your back to sitting on the side of a flat bed without using bedrails?: Total Help needed moving to and from a bed to a chair (including a wheelchair)?: Total Help needed standing up from a chair using your arms (e.g., wheelchair or bedside chair)?: Total Help needed to walk in hospital room?: Total Help needed climbing 3-5 steps with a railing? : Total 6 Click Score: 6    End of Session   Activity Tolerance: Patient limited by fatigue Patient left: in chair;with call bell/phone within reach Nurse Communication: Mobility status;Need for lift equipment PT Visit Diagnosis: Other abnormalities of gait and mobility (R26.89);Muscle weakness (generalized) (M62.81)     Time: 6578-4696 PT Time Calculation (min) (ACUTE ONLY): 24 min  Charges:  $Therapeutic Exercise: 8-22 mins $Therapeutic Activity: 8-22 mins                     Felecia Shelling  PTA Acute  Rehabilitation Services Office M-F          [phone]

## 2023-02-10 NOTE — Progress Notes (Signed)
RT transported patient to radiation on LTV vent and back to ICU with no complications. Once back to room patient was hooked back to Servo-I with vitald stable.

## 2023-02-10 NOTE — Progress Notes (Signed)
Pt placed back on full support due to increase WOB, rr 49 and anxiety.

## 2023-02-11 DIAGNOSIS — Z9911 Dependence on respirator [ventilator] status: Secondary | ICD-10-CM | POA: Diagnosis not present

## 2023-02-11 DIAGNOSIS — Z930 Tracheostomy status: Secondary | ICD-10-CM | POA: Diagnosis not present

## 2023-02-11 DIAGNOSIS — J9601 Acute respiratory failure with hypoxia: Secondary | ICD-10-CM | POA: Diagnosis not present

## 2023-02-11 LAB — GLUCOSE, CAPILLARY
Glucose-Capillary: 129 mg/dL — ABNORMAL HIGH (ref 70–99)
Glucose-Capillary: 142 mg/dL — ABNORMAL HIGH (ref 70–99)
Glucose-Capillary: 97 mg/dL (ref 70–99)
Glucose-Capillary: 106 mg/dL — ABNORMAL HIGH (ref 70–99)
Glucose-Capillary: 118 mg/dL — ABNORMAL HIGH (ref 70–99)
Glucose-Capillary: 130 mg/dL — ABNORMAL HIGH (ref 70–99)

## 2023-02-11 LAB — BASIC METABOLIC PANEL
Anion gap: 9 (ref 5–15)
BUN: 22 mg/dL (ref 8–23)
CO2: 29 mmol/L (ref 22–32)
Calcium: 8.7 mg/dL — ABNORMAL LOW (ref 8.9–10.3)
Chloride: 101 mmol/L (ref 98–111)
Creatinine, Ser: 0.86 mg/dL (ref 0.44–1.00)
GFR, Estimated: >60 mL/min (ref >60)
Glucose, Bld: 141 mg/dL — ABNORMAL HIGH (ref 70–99)
Potassium: 4.4 mmol/L (ref 3.5–5.1)
Sodium: 139 mmol/L (ref 135–145)

## 2023-02-11 LAB — CBC
HCT: 25.4 % — ABNORMAL LOW (ref 36.0–46.0)
Hemoglobin: 7.7 g/dL — ABNORMAL LOW (ref 12.0–15.0)
MCH: 27 pg (ref 26.0–34.0)
MCHC: 30.3 g/dL (ref 30.0–36.0)
MCV: 89.1 fL (ref 80.0–100.0)
Platelets: 367 10*3/uL (ref 150–400)
RBC: 2.85 MIL/uL — ABNORMAL LOW (ref 3.87–5.11)
RDW: 14.9 % (ref 11.5–15.5)
WBC: 5.9 10*3/uL (ref 4.0–10.5)
nRBC: 0 % (ref 0.0–0.2)

## 2023-02-11 LAB — MAGNESIUM: Magnesium: 2.4 mg/dL (ref 1.7–2.4)

## 2023-02-11 MED ORDER — AMLODIPINE BESYLATE 10 MG PO TABS
10.0000 mg | ORAL_TABLET | Freq: Every day | ORAL | Status: DC
  Administered 2023-02-11 – 2023-02-12 (×2): 10 mg
  Filled 2023-02-11 (×2): qty 1

## 2023-02-11 MED ORDER — LACTATED RINGERS IV SOLN: INTRAVENOUS | Status: DC

## 2023-02-11 NOTE — Progress Notes (Signed)
NAME:  [name], MRN:  [id number], DOB:  01-02-1956, LOS: 30 ADMISSION DATE:  01/11/2023, CONSULTATION DATE:  01/16/2023 REFERRING MD: FMTS, CHIEF COMPLAINT: Acute Respiratory Failure in the setting of Lung Cancer, COPD and infiltrate on CXR   History of Present Illness:  67 yo female former smoker presented to [facility] ER with nausea, vomiting, diarrhea and abdominal pain for 2 weeks.  Found to have RML and RLL post-obstructive pneumonia with Rt hilar mass, new onset A fib.  She required intubation on 5/06 for hypoxic respiratory failure.  Transferred to [facility] on 5/07 for cancer therapy.  Pertinent Medical History:  COPD, Adenocarcinoma LLL November 2021, Bladder cancer, GERD, Anxiety with panic attacks, Depression, Allergies  Significant Hospital Events: Including procedures, antibiotic start and stop dates in addition to other pertinent events   5/6 Transferred to ICU for acute Respiratory Failure , intubated  5/7 Bronch by Icard. significant extrinsic compression of the airways in the right lower lobe and middle lobe. This is likely malignant obstruction. Bx sent. Transferred to [facility] for rad/onc eval. PICC placed 5/8 Stable still high PEEP/FiO2 needs. Rad/onc consult requested  5/9 start radiation therapy 5/10 Tolerating SBT trial this AM on low-dose fentanyl and Precedex.  Propofol DC'd due to high triglycerides 5/11 Tolerates SBT's, pressure support 10/5 5/12 Off of pressors. Remains on vent, sedated with Precedex/Fentanyl. PMT consulted. 5/13 Weaning on PSV 14/8. > 10L positive over the course of admission. Diuresing. 5/14 Weaning on PSV. Ongoing diuresis. 5/15 Tolerating SBT, PSV 5/5, following commands. Continue diuresis, Foley placed for persistent urinary retention. Extubated, decompensated quickly after requiring reintubation. DHT placed. 5/16 Agitated overnight, c/o throat pain with ETT, RASS 1-2 despite sedation. Failed extubation. Tolerating SBT PSV 10/5, FiO2 35%. ~1630 desaturated  with inability to pass Ballard suction/difficulty bagging; urgent tube exchange completed with bougie and blood clot removed with improvement in oxygenation/ventilation. 5/17 Tracheostomy placed 5/19 Back in atrial fibrillation in am, HR 150s 5/20 Weaned on pressure support 10/5 5/21 Desaturated during weaning. Still on dex and fent gtt 5/23 added fent patch and seroquel had to have NE gtt and midodrine started for drug related hypotension 5/24 Trialing fent gtt off. Foley removed 5/25 3 hrs. trach collar 5/26 Pressure support minimal trach collar 5/28 SIMV/PS, increased pain. Changed oxy fent patch to dilaudid incr seroquel 5/29 radiation. Off dilaudid gtt. Lower dose precedex requirement  5/30 try to wean precedex. Repeat anxiety during/after rad. Incr anxiolytics  Interim History / Subjective:  Remains on vent, sedation.  Objective:  Blood pressure (!) 94/49, pulse (!) 102, temperature (!) 97.3 F (36.3 C), temperature source Axillary, resp. rate (!) 24, height 5\' 8"  (1.727 m), weight 81.1 kg, SpO2 96 %.    Vent Mode: PSV FiO2 (%):  [40 %] 40 % Set Rate:  [10 bmp] 10 bmp Vt Set:  [510 mL] 510 mL PEEP:  [8 cmH20] 8 cmH20 Pressure Support:  [10 cmH20-12 cmH20] 10 cmH20 Plateau Pressure:  [18 cmH20-25 cmH20] 18 cmH20   Intake/Output Summary (Last 24 hours) at 02/11/2023 0827 Last data filed at 02/11/2023 0600 Gross per 24 hour  Intake 1971.75 ml  Output 1401 ml  Net 570.75 ml   Filed Weights   02/08/23 0335 02/09/23 0500 02/10/23 0500  Weight: 79.7 kg 81.8 kg 81.1 kg   Physical Examination:  General - sedated Eyes - pupils reactive ENT - trach site clean Cardiac - regular rate/rhythm, no murmur Chest - b/l rhonchi Abdomen - soft, non tender, + bowel sounds Extremities -  1+ edema Skin - no rashes Neuro - RASS -3  Resolved Hospital Problem List:  Hypertriglyceridemia due to propofol, Hyponatremia , Hypotension drug induced from sedation. Vocal cord edema, Post obstructive  PNA: Zosyn completed 5/13  Assessment & Plan:   Acute hypoxic respiratory failure from post-obstructive pneumonia and atelectasis in setting of COPD with emphysema. Failure to wean s/p tracheostomy. - pressure support wean to trach collar as tolerated; use pressure control as rest mode - goal SpO2 90 to 95% - f/u CXR intermittently - continue brovana, pulmicort, yupelri - prn albuterol - trach care  Rt hilar mass with poorly differentiated NSCLC. Hx of LLL adenocarcinoma from November 2021. - continue radiation therapy per radiation oncology  Acute metabolic encephalopathy from sepsis and hypoxia. Hx of depression, anxiety, chronic cancer pain. - precedex for RASS goal 0 to -1 - continue buspar, celexa, klonopin, neurontin, dilaudid, robaxin, remeron, seroquel  Hypotension from hypovolemia and sedation. - continue midodrine - continue IV fluids  Paroxysmal atrial fibrillation. HLD. - now in sinus rhythm - continue eliquis, lipitor   Anemia of critical illness, iron deficiency, and chronic disease. - continue ferrous sulfate - f/u CBC   Urine retention. - foley replaced 5/25  - continue bethanechol  Deconditioning. - PT/OT  Best Practice: (right click and "Reselect all SmartList Selections" daily)   Diet/type: tubefeeds DVT prophylaxis: Eliquis  GI prophylaxis: H2B Lines: Central line - PICC Foley:  Yes Code Status:  full code Last date of multidisciplinary goals of care discussion: 5/25   Labs:      Latest Ref Rng & Units 02/09/2023    4:25 AM 02/08/2023    5:22 AM 02/07/2023    6:06 AM  CMP  Glucose 70 - 99 mg/dL 784  696  295   BUN 8 - 23 mg/dL 26  27  27    Creatinine 0.44 - 1.00 mg/dL 2.84  1.32  4.40   Sodium 135 - 145 mmol/L 134  136  136   Potassium 3.5 - 5.1 mmol/L 4.4  4.1  3.9   Chloride 98 - 111 mmol/L 97  100  100   CO2 22 - 32 mmol/L 28  28  28    Calcium 8.9 - 10.3 mg/dL 8.6  8.4  8.4        Latest Ref Rng & Units 02/11/2023    4:56 AM  02/10/2023    4:16 AM 02/09/2023    4:25 AM  CBC  WBC 4.0 - 10.5 K/uL 5.9  6.9  8.6   Hemoglobin 12.0 - 15.0 g/dL 7.7  7.8  7.4   Hematocrit 36.0 - 46.0 % 25.4  26.0  24.8   Platelets 150 - 400 K/uL 367  425  337    ABG    Component Value Date/Time   PHART 7.550 (H) 01/17/2023 0343   PCO2ART 38.4 01/17/2023 0343   PO2ART 53 (L) 01/17/2023 0343   HCO3 33.7 (H) 01/17/2023 0343   TCO2 35 (H) 01/17/2023 0343   O2SAT 91 01/17/2023 0343    CBG (last 3)  Recent Labs    02/10/23 1258 02/10/23 1727 02/10/23 1921  GLUCAP 107* 101* 115*    Critical care time: 43 minutes  Coralyn Helling, MD [place] Pulmonary/Critical Care Pager - [phone] or [phone] 02/11/2023, 8:27 AM

## 2023-02-11 NOTE — Progress Notes (Signed)
BP improving.  Will d/c midodrine and restart outpt norvasc.  Coralyn Helling, MD [facility] Pulmonary/Critical Care Pager - [phone] or [phone] 02/11/2023, 6:54 PM

## 2023-02-11 DEATH — deceased

## 2023-02-12 DIAGNOSIS — J9601 Acute respiratory failure with hypoxia: Secondary | ICD-10-CM | POA: Diagnosis not present

## 2023-02-12 LAB — CBC
HCT: 22.8 % — ABNORMAL LOW (ref 36.0–46.0)
Hemoglobin: 7 g/dL — ABNORMAL LOW (ref 12.0–15.0)
MCH: 26.6 pg (ref 26.0–34.0)
MCHC: 30.7 g/dL (ref 30.0–36.0)
MCV: 86.7 fL (ref 80.0–100.0)
Platelets: 366 10*3/uL (ref 150–400)
RBC: 2.63 MIL/uL — ABNORMAL LOW (ref 3.87–5.11)
RDW: 14.6 % (ref 11.5–15.5)
WBC: 5.9 10*3/uL (ref 4.0–10.5)
nRBC: 0 % (ref 0.0–0.2)

## 2023-02-12 LAB — GLUCOSE, CAPILLARY
Glucose-Capillary: 98 mg/dL (ref 70–99)
Glucose-Capillary: 104 mg/dL — ABNORMAL HIGH (ref 70–99)
Glucose-Capillary: 121 mg/dL — ABNORMAL HIGH (ref 70–99)
Glucose-Capillary: 128 mg/dL — ABNORMAL HIGH (ref 70–99)
Glucose-Capillary: 90 mg/dL (ref 70–99)
Glucose-Capillary: 107 mg/dL — ABNORMAL HIGH (ref 70–99)

## 2023-02-12 LAB — MAGNESIUM: Magnesium: 2 mg/dL (ref 1.7–2.4)

## 2023-02-12 MED ORDER — HYDROCODONE BIT-HOMATROP MBR 5-1.5 MG/5ML PO SOLN
5.0000 mL | ORAL | Status: DC | PRN
  Administered 2023-02-16 – 2023-02-19 (×3): 5 mL
  Filled 2023-02-12 (×3): qty 5

## 2023-02-12 MED ORDER — CITALOPRAM HYDROBROMIDE 20 MG PO TABS
20.0000 mg | ORAL_TABLET | Freq: Every day | ORAL | Status: DC
  Administered 2023-02-12 – 2023-03-01 (×17): 20 mg
  Filled 2023-02-12 (×18): qty 1

## 2023-02-12 MED ORDER — BUSPIRONE HCL 10 MG PO TABS
20.0000 mg | ORAL_TABLET | Freq: Two times a day (BID) | ORAL | Status: DC
  Administered 2023-02-12 – 2023-02-26 (×29): 20 mg
  Filled 2023-02-12 (×29): qty 2

## 2023-02-12 NOTE — Progress Notes (Signed)
NAME:  [name], MRN:  [id number], DOB:  Apr 07, 1956, LOS: 31 ADMISSION DATE:  01/11/2023, CONSULTATION DATE:  01/16/2023 REFERRING MD: FMTS, CHIEF COMPLAINT: Acute Respiratory Failure in the setting of Lung Cancer, COPD and infiltrate on CXR   History of Present Illness:  67 yo female former smoker presented to [facility] ER with nausea, vomiting, diarrhea and abdominal pain for 2 weeks.  Found to have RML and RLL post-obstructive pneumonia with Rt hilar mass, new onset A fib.  She required intubation on 5/06 for hypoxic respiratory failure.  Transferred to [facility] on 5/07 for cancer therapy.  Pertinent Medical History:  COPD, Adenocarcinoma LLL November 2021, Bladder cancer, GERD, Anxiety with panic attacks, Depression, Allergies  Significant Hospital Events: Including procedures, antibiotic start and stop dates in addition to other pertinent events   5/6 Transferred to ICU for acute Respiratory Failure , intubated  5/7 Bronch by Icard. significant extrinsic compression of the airways in the right lower lobe and middle lobe. This is likely malignant obstruction. Bx sent. Transferred to [facility] for rad/onc eval. PICC placed 5/8 Stable still high PEEP/FiO2 needs. Rad/onc consult requested  5/9 start radiation therapy 5/10 Tolerating SBT trial this AM on low-dose fentanyl and Precedex.  Propofol DC'd due to high triglycerides 5/11 Tolerates SBT's, pressure support 10/5 5/12 Off of pressors. Remains on vent, sedated with Precedex/Fentanyl. PMT consulted. 5/13 Weaning on PSV 14/8. > 10L positive over the course of admission. Diuresing. 5/14 Weaning on PSV. Ongoing diuresis. 5/15 Tolerating SBT, PSV 5/5, following commands. Continue diuresis, Foley placed for persistent urinary retention. Extubated, decompensated quickly after requiring reintubation. DHT placed. 5/16 Agitated overnight, c/o throat pain with ETT, RASS 1-2 despite sedation. Failed extubation. Tolerating SBT PSV 10/5, FiO2 35%. ~1630 desaturated  with inability to pass Ballard suction/difficulty bagging; urgent tube exchange completed with bougie and blood clot removed with improvement in oxygenation/ventilation. 5/17 Tracheostomy placed 5/19 Back in atrial fibrillation in am, HR 150s 5/20 Weaned on pressure support 10/5 5/21 Desaturated during weaning. Still on dex and fent gtt 5/23 added fent patch and seroquel had to have NE gtt and midodrine started for drug related hypotension 5/24 Trialing fent gtt off. Foley removed 5/25 3 hrs. trach collar 5/26 Pressure support minimal trach collar 5/28 SIMV/PS, increased pain. Changed oxy fent patch to dilaudid incr seroquel 5/29 radiation. Off dilaudid gtt. Lower dose precedex requirement  5/30 try to wean precedex. Repeat anxiety during/after rad. Incr anxiolytics 6/01 change to pressure control as rest mode  Interim History / Subjective:  Feels more comfortable with pressure control.  Became anxious with increased RR when tried on pressure support.  Bed is uncomfortable.  Objective:  Blood pressure 125/62, pulse 78, temperature 98.1 F (36.7 C), temperature source Axillary, resp. rate (!) 24, height 5\' 8"  (1.727 m), weight 81.1 kg, SpO2 93 %.    Vent Mode: PCV FiO2 (%):  [40 %-50 %] 50 % Set Rate:  [24 bmp] 24 bmp PEEP:  [5 cmH20] 5 cmH20 Plateau Pressure:  [19 cmH20-26 cmH20] 20 cmH20   Intake/Output Summary (Last 24 hours) at 02/12/2023 4098 Last data filed at 02/12/2023 0700 Gross per 24 hour  Intake 2760.9 ml  Output 325 ml  Net 2435.9 ml   Filed Weights   02/08/23 0335 02/09/23 0500 02/10/23 0500  Weight: 79.7 kg 81.8 kg 81.1 kg   Physical Examination:  General - sedated Eyes - pupils reactive ENT - trach site clean Cardiac - regular rate/rhythm, no murmur Chest - scattered rhonchi Abdomen -  soft, non tender, + bowel sounds Extremities - no cyanosis, clubbing, or edema Skin - no rashes Neuro - RASS 0, follows commands, moves extremities  Resolved Hospital Problem  List:  Hypertriglyceridemia due to propofol, Hyponatremia , Hypotension drug induced from sedation. Vocal cord edema, Post obstructive PNA: Zosyn completed 5/13, Hypotension from hypovolemia and sedation  Assessment & Plan:   Acute hypoxic respiratory failure from post-obstructive pneumonia and atelectasis in setting of COPD with emphysema. Failure to wean s/p tracheostomy. - pressure support wean to trach collar as tolerated; use pressure control as rest mode - anxiety and deconditioning limit ability to tolerate vent weaning - goal SpO2 90 to 95% - f/u CXR 6/03 - continue brovana, pulmicort, yupelri - prn albuterol - trach care  Rt hilar mass with poorly differentiated NSCLC. Hx of LLL adenocarcinoma from November 2021. - continue radiation therapy per radiation oncology  Acute metabolic encephalopathy from sepsis and hypoxia. Hx of depression, anxiety, chronic cancer pain. - precedex for RASS goal 0 to -1 - increase celexa to 20 mg daily, buspar to 20 mg bid - continue klonopin 2 mg tid, neurontin 600 mg tid, dilaudid 1 mg q4h, robaxin 500 mg tid, remeron 15 mg qhs, seroquel 100 mg bid  Paroxysmal atrial fibrillation. HLD, HTN. - now in sinus rhythm - continue eliquis, lipitor, norvasc   Anemia of critical illness, iron deficiency, and chronic disease. - continue ferrous sulfate - f/u CBC   Urine retention. - foley replaced 5/25  - continue bethanechol  Deconditioning. - PT/OT  Best Practice: (right click and "Reselect all SmartList Selections" daily)   Diet/type: tubefeeds DVT prophylaxis: Eliquis  GI prophylaxis: H2B Lines: Central line - PICC Foley:  Yes Code Status:  full code Last date of multidisciplinary goals of care discussion: 5/25   Labs:      Latest Ref Rng & Units 02/11/2023    9:09 AM 02/09/2023    4:25 AM 02/08/2023    5:22 AM  CMP  Glucose 70 - 99 mg/dL 161  096  045   BUN 8 - 23 mg/dL 22  26  27    Creatinine 0.44 - 1.00 mg/dL 4.09  8.11   9.14   Sodium 135 - 145 mmol/L 139  134  136   Potassium 3.5 - 5.1 mmol/L 4.4  4.4  4.1   Chloride 98 - 111 mmol/L 101  97  100   CO2 22 - 32 mmol/L 29  28  28    Calcium 8.9 - 10.3 mg/dL 8.7  8.6  8.4        Latest Ref Rng & Units 02/12/2023    4:00 AM 02/11/2023    4:56 AM 02/10/2023    4:16 AM  CBC  WBC 4.0 - 10.5 K/uL 5.9  5.9  6.9   Hemoglobin 12.0 - 15.0 g/dL 7.0  7.7  7.8   Hematocrit 36.0 - 46.0 % 22.8  25.4  26.0   Platelets 150 - 400 K/uL 366  367  425    ABG    Component Value Date/Time   PHART 7.550 (H) 01/17/2023 0343   PCO2ART 38.4 01/17/2023 0343   PO2ART 53 (L) 01/17/2023 0343   HCO3 33.7 (H) 01/17/2023 0343   TCO2 35 (H) 01/17/2023 0343   O2SAT 91 01/17/2023 0343    CBG (last 3)  Recent Labs    02/11/23 1142 02/11/23 1544 02/11/23 1943  GLUCAP 106* 118* 130*    Critical care time: 33 minutes  Coralyn Helling, MD [place]  Pulmonary/Critical Care Pager - [phone] or [phone] 02/12/2023, 8:23 AM

## 2023-02-13 ENCOUNTER — Ambulatory Visit
Admission: RE | Admit: 2023-02-13 | Discharge: 2023-02-13 | Disposition: A | Discharge status: Home / Self Care | Payer: 59 | Source: Ambulatory Visit | Attending: Radiation Oncology | Admitting: Radiation Oncology

## 2023-02-13 ENCOUNTER — Inpatient Hospital Stay (HOSPITAL_COMMUNITY): Payer: 59

## 2023-02-13 ENCOUNTER — Other Ambulatory Visit: Payer: Self-pay

## 2023-02-13 DIAGNOSIS — Z87891 Personal history of nicotine dependence: Secondary | ICD-10-CM | POA: Diagnosis not present

## 2023-02-13 DIAGNOSIS — Z510 Encounter for antineoplastic radiation therapy: Secondary | ICD-10-CM | POA: Diagnosis not present

## 2023-02-13 DIAGNOSIS — J988 Other specified respiratory disorders: Secondary | ICD-10-CM | POA: Insufficient documentation

## 2023-02-13 DIAGNOSIS — R918 Other nonspecific abnormal finding of lung field: Secondary | ICD-10-CM | POA: Insufficient documentation

## 2023-02-13 DIAGNOSIS — C3432 Malignant neoplasm of lower lobe, left bronchus or lung: Secondary | ICD-10-CM | POA: Diagnosis not present

## 2023-02-13 DIAGNOSIS — C3431 Malignant neoplasm of lower lobe, right bronchus or lung: Secondary | ICD-10-CM | POA: Insufficient documentation

## 2023-02-13 DIAGNOSIS — J9601 Acute respiratory failure with hypoxia: Secondary | ICD-10-CM | POA: Diagnosis not present

## 2023-02-13 LAB — RAD ONC ARIA SESSION SUMMARY
Course Elapsed Days: 26
Plan Fractions Treated to Date: 15
Plan Prescribed Dose Per Fraction: 2 Gy
Plan Total Fractions Prescribed: 27
Plan Total Prescribed Dose: 54 Gy
Reference Point Dosage Given to Date: 30 Gy
Reference Point Session Dosage Given: 2 Gy
Session Number: 18

## 2023-02-13 LAB — BLOOD GAS, VENOUS
Acid-Base Excess: 5.4 mmol/L — ABNORMAL HIGH (ref 0.0–2.0)
Bicarbonate: 29.9 mmol/L — ABNORMAL HIGH (ref 20.0–28.0)
O2 Saturation: 60.2 %
Patient temperature: 37
pCO2, Ven: 42 mmHg — ABNORMAL LOW (ref 44–60)
pH, Ven: 7.46 — ABNORMAL HIGH (ref 7.25–7.43)
pO2, Ven: 37 mmHg (ref 32–45)

## 2023-02-13 LAB — BASIC METABOLIC PANEL
Anion gap: 7 (ref 5–15)
BUN: 24 mg/dL — ABNORMAL HIGH (ref 8–23)
CO2: 26 mmol/L (ref 22–32)
Calcium: 8.7 mg/dL — ABNORMAL LOW (ref 8.9–10.3)
Chloride: 105 mmol/L (ref 98–111)
Creatinine, Ser: 0.84 mg/dL (ref 0.44–1.00)
GFR, Estimated: >60 mL/min (ref >60)
Glucose, Bld: 153 mg/dL — ABNORMAL HIGH (ref 70–99)
Potassium: 4.1 mmol/L (ref 3.5–5.1)
Sodium: 138 mmol/L (ref 135–145)

## 2023-02-13 LAB — CULTURE, RESPIRATORY W GRAM STAIN

## 2023-02-13 LAB — CBC
HCT: 22.1 % — ABNORMAL LOW (ref 36.0–46.0)
Hemoglobin: 6.8 g/dL — CL (ref 12.0–15.0)
MCH: 27 pg (ref 26.0–34.0)
MCHC: 30.8 g/dL (ref 30.0–36.0)
MCV: 87.7 fL (ref 80.0–100.0)
Platelets: 337 10*3/uL (ref 150–400)
RBC: 2.52 MIL/uL — ABNORMAL LOW (ref 3.87–5.11)
RDW: 14.8 % (ref 11.5–15.5)
WBC: 5.3 10*3/uL (ref 4.0–10.5)
nRBC: 0 % (ref 0.0–0.2)

## 2023-02-13 LAB — PREPARE RBC (CROSSMATCH)

## 2023-02-13 LAB — HEMOGLOBIN AND HEMATOCRIT, BLOOD
HCT: 24.9 % — ABNORMAL LOW (ref 36.0–46.0)
Hemoglobin: 7.8 g/dL — ABNORMAL LOW (ref 12.0–15.0)

## 2023-02-13 LAB — GLUCOSE, CAPILLARY
Glucose-Capillary: 106 mg/dL — ABNORMAL HIGH (ref 70–99)
Glucose-Capillary: 119 mg/dL — ABNORMAL HIGH (ref 70–99)
Glucose-Capillary: 120 mg/dL — ABNORMAL HIGH (ref 70–99)
Glucose-Capillary: 133 mg/dL — ABNORMAL HIGH (ref 70–99)
Glucose-Capillary: 138 mg/dL — ABNORMAL HIGH (ref 70–99)
Glucose-Capillary: 160 mg/dL — ABNORMAL HIGH (ref 70–99)

## 2023-02-13 LAB — BPAM RBC: ISSUE DATE / TIME: 202406030920

## 2023-02-13 LAB — TYPE AND SCREEN: ABO/RH(D): O POS

## 2023-02-13 MED ORDER — SODIUM CHLORIDE 0.9% IV SOLUTION: Freq: Once | INTRAVENOUS | Status: AC

## 2023-02-13 MED ORDER — SODIUM CHLORIDE 3 % IN NEBU
4.0000 mL | INHALATION_SOLUTION | Freq: Two times a day (BID) | RESPIRATORY_TRACT | Status: AC
  Administered 2023-02-13 – 2023-02-15 (×6): 4 mL via RESPIRATORY_TRACT
  Filled 2023-02-13 (×6): qty 4

## 2023-02-13 MED ORDER — GUAIFENESIN ER 600 MG PO TB12: 1200.0000 mg | ORAL_TABLET | Freq: Two times a day (BID) | ORAL | Status: DC

## 2023-02-13 MED ORDER — NOREPINEPHRINE 4 MG/250ML-% IV SOLN
INTRAVENOUS | Status: AC
  Filled 2023-02-13: qty 250

## 2023-02-13 MED ORDER — NOREPINEPHRINE 4 MG/250ML-% IV SOLN
0.0000 ug/min | INTRAVENOUS | Status: DC
  Administered 2023-02-13: 2 ug/min via INTRAVENOUS
  Administered 2023-02-15: 7 ug/min via INTRAVENOUS
  Administered 2023-02-16: 4 ug/min via INTRAVENOUS
  Filled 2023-02-13 (×3): qty 250

## 2023-02-13 MED ORDER — ALBUMIN HUMAN 25 % IV SOLN
25.0000 g | Freq: Once | INTRAVENOUS | Status: AC
  Administered 2023-02-13: 25 g via INTRAVENOUS
  Filled 2023-02-13: qty 100

## 2023-02-13 MED ORDER — GUAIFENESIN 100 MG/5ML PO LIQD
10.0000 mL | ORAL | Status: AC
  Administered 2023-02-13 – 2023-02-15 (×12): 10 mL
  Filled 2023-02-13 (×12): qty 10

## 2023-02-13 NOTE — Progress Notes (Signed)
NAME:  [name], MRN:  [id number], DOB:  03-Oct-1955, LOS: 32 ADMISSION DATE:  01/11/2023, CONSULTATION DATE:  01/16/2023 REFERRING MD: FMTS, CHIEF COMPLAINT: Acute Respiratory Failure in the setting of Lung Cancer, COPD and infiltrate on CXR   History of Present Illness:  67 yo female former smoker presented to [facility] ER with nausea, vomiting, diarrhea and abdominal pain for 2 weeks.  Found to have RML and RLL post-obstructive pneumonia with Rt hilar mass, new onset A fib.  She required intubation on 5/06 for hypoxic respiratory failure.  Transferred to [facility] on 5/07 for cancer therapy.  Pertinent Medical History:  COPD, Adenocarcinoma LLL November 2021, Bladder cancer, GERD, Anxiety with panic attacks, Depression, Allergies  Significant Hospital Events: Including procedures, antibiotic start and stop dates in addition to other pertinent events   5/6 Transferred to ICU for acute Respiratory Failure , intubated  5/7 Bronch by Icard. significant extrinsic compression of the airways in the right lower lobe and middle lobe. This is likely malignant obstruction. Bx sent. Transferred to [facility] for rad/onc eval. PICC placed 5/8 Stable still high PEEP/FiO2 needs. Rad/onc consult requested  5/9 start radiation therapy 5/10 Tolerating SBT trial this AM on low-dose fentanyl and Precedex.  Propofol DC'd due to high triglycerides 5/11 Tolerates SBT's, pressure support 10/5 5/12 Off of pressors. Remains on vent, sedated with Precedex/Fentanyl. PMT consulted. 5/13 Weaning on PSV 14/8. > 10L positive over the course of admission. Diuresing. 5/14 Weaning on PSV. Ongoing diuresis. 5/15 Tolerating SBT, PSV 5/5, following commands. Continue diuresis, Foley placed for persistent urinary retention. Extubated, decompensated quickly after requiring reintubation. DHT placed. 5/16 Agitated overnight, c/o throat pain with ETT, RASS 1-2 despite sedation. Failed extubation. Tolerating SBT PSV 10/5, FiO2 35%. ~1630 desaturated  with inability to pass [place] suction/difficulty bagging; urgent tube exchange completed with bougie and blood clot removed with improvement in oxygenation/ventilation. 5/17 Tracheostomy placed 5/19 Back in atrial fibrillation in am, HR 150s 5/20 Weaned on pressure support 10/5 5/21 Desaturated during weaning. Still on dex and fent gtt 5/23 added fent patch and seroquel had to have NE gtt and midodrine started for drug related hypotension 5/24 Trialing fent gtt off. Foley removed 5/25 3 hrs. trach collar 5/26 Pressure support minimal trach collar 5/28 SIMV/PS, increased pain. Changed oxy fent patch to dilaudid incr seroquel 5/29 radiation. Off dilaudid gtt. Lower dose precedex requirement  5/30 try to wean precedex. Repeat anxiety during/after rad. Incr anxiolytics 6/01 change to pressure control as rest mode  Interim History / Subjective:   More tachycardic, hypotensive, had been started on amlodipine 6/1. Afebrile.   Albumin given and 1U pRBC has been ordered  Increased effusions or atelectasis on CXR  Objective:  Blood pressure (!) 90/45, pulse (!) 122, temperature 98.5 F (36.9 C), temperature source Oral, resp. rate 18, height 5\' 8"  (1.727 m), weight 81.1 kg, SpO2 94 %.    Vent Mode: PCV FiO2 (%):  [50 %] 50 % Set Rate:  [24 bmp] 24 bmp PEEP:  [5 cmH20] 5 cmH20 Plateau Pressure:  [16 cmH20-23 cmH20] 16 cmH20   Intake/Output Summary (Last 24 hours) at 02/13/2023 0839 Last data filed at 02/13/2023 0800 Gross per 24 hour  Intake 2987.49 ml  Output 2950 ml  Net 37.49 ml   Filed Weights   02/08/23 0335 02/09/23 0500 02/10/23 0500  Weight: 79.7 kg 81.8 kg 81.1 kg   Physical Examination: Anxious, follows commands, grossly nonfocal Rhonchi bl, equal chest rise, +secretions in ballard Abdomen soft, NT BLE  warm, trace edema  Resolved Hospital Problem List:  Hypertriglyceridemia due to propofol, Hyponatremia , Hypotension drug induced from sedation. Vocal cord edema, Post  obstructive PNA: Zosyn completed 5/13, Hypotension from hypovolemia and sedation  Assessment & Plan:   Acute hypoxic respiratory failure from post-obstructive pneumonia and atelectasis in setting of COPD with emphysema. Failure to wean s/p tracheostomy. - will ask if we can review most treatment CT, appears to still be having issues with mucus plugging and vent dependence raises concern she still may have central airway obstruction - check vbg while she's on PC - PS wean to trach collar as tolerated; use pressure control as rest mode - anxiety and deconditioning limit ability to tolerate vent weaning - increase airway clearance measures, check sputum culture - goal SpO2 90 to 95% - continue brovana, pulmicort, yupelri - prn albuterol - trach care  Rt hilar mass with poorly differentiated NSCLC. Hx of LLL adenocarcinoma from November 2021. - continue radiation therapy per radiation oncology  Acute metabolic encephalopathy from sepsis and hypoxia. Hx of depression, anxiety, chronic cancer pain. - precedex for RASS goal 0 to -1 - increase celexa to 20 mg daily, buspar to 20 mg bid - continue klonopin 2 mg tid, neurontin 600 mg tid, dilaudid 1 mg q4h, robaxin 500 mg tid, remeron 15 mg qhs, seroquel 100 mg bid  Paroxysmal atrial fibrillation. HLD, HTN. - back in AF today - continue eliquis, lipitor, hold norvasc   Anemia of critical illness, iron deficiency, and chronic disease. - continue ferrous sulfate - f/u CBC   Urine retention. - foley replaced 5/25  - continue bethanechol  Deconditioning. - PT/OT  Best Practice: (right click and "Reselect all SmartList Selections" daily)   Diet/type: tubefeeds DVT prophylaxis: Eliquis  GI prophylaxis: H2B Lines: Central line - PICC Foley:  Yes Code Status:  full code Last date of multidisciplinary goals of care discussion: 5/25   Labs:      Latest Ref Rng & Units 02/13/2023    5:00 AM 02/11/2023    9:09 AM 02/09/2023    4:25 AM   CMP  Glucose 70 - 99 mg/dL 098  119  147   BUN 8 - 23 mg/dL 24  22  26    Creatinine 0.44 - 1.00 mg/dL 8.29  5.62  1.30   Sodium 135 - 145 mmol/L 138  139  134   Potassium 3.5 - 5.1 mmol/L 4.1  4.4  4.4   Chloride 98 - 111 mmol/L 105  101  97   CO2 22 - 32 mmol/L 26  29  28    Calcium 8.9 - 10.3 mg/dL 8.7  8.7  8.6        Latest Ref Rng & Units 02/13/2023    5:00 AM 02/12/2023    4:00 AM 02/11/2023    4:56 AM  CBC  WBC 4.0 - 10.5 K/uL 5.3  5.9  5.9   Hemoglobin 12.0 - 15.0 g/dL 6.8  7.0  7.7   Hematocrit 36.0 - 46.0 % 22.1  22.8  25.4   Platelets 150 - 400 K/uL 337  366  367    ABG    Component Value Date/Time   PHART 7.550 (H) 01/17/2023 0343   PCO2ART 38.4 01/17/2023 0343   PO2ART 53 (L) 01/17/2023 0343   HCO3 33.7 (H) 01/17/2023 0343   TCO2 35 (H) 01/17/2023 0343   O2SAT 91 01/17/2023 0343    CBG (last 3)  Recent Labs    02/12/23 2322 02/13/23 0344  02/13/23 0747  GLUCAP 107* 120* 106*    Critical care time: 40 minutes   Laroy Apple Pulmonary/Critical Care  Pager - [phone] or [phone] 02/13/2023, 8:39 AM

## 2023-02-13 NOTE — Progress Notes (Signed)
PT Cancellation Note  Patient Details Name: [name] MRN: [id number] DOB: 12-30-1955   Cancelled Treatment:     Pt's HgB 6.8 Will attempt to see another day as schedule permits.    Felecia Shelling  PTA Acute  Rehabilitation Services Office M-F          [phone]

## 2023-02-13 NOTE — Progress Notes (Addendum)
eLink Physician-Brief Progress Note Patient Name: [name] DOB: January 09, 1956 MRN: [id number]   Date of Service  02/13/2023  HPI/Events of Note  67 year old female that initially presented with acute respiratory failure with postobstructive pneumonia in the setting of COPD exacerbation status post tracheostomy with a right hilar non-small cell lung cancer that was previously in septic shock then became hypertensive.  Now being called that the patient is slightly more hypotensive on similar sedation.  eICU Interventions  Start with albumin bolus to see if this helps.  If this is ineffective, initiate peripheral norepinephrine and discontinue scheduled amlodipine.  Stable sedation given ongoing intermittent agitation.   0603 - Hg 6.8, will transfuse 1u prbc  Intervention Category Intermediate Interventions: Hypotension - evaluation and management  Majour Frei 02/13/2023, 4:27 AM

## 2023-02-13 NOTE — Progress Notes (Signed)
CPT held at this time due to pt sleeping.  °

## 2023-02-13 NOTE — Progress Notes (Signed)
SLP Cancellation Note  Patient Details Name: [name] MRN: [id number] DOB: [birth year]   Cancelled treatment:       Reason Eval/Treat Not Completed: Patient not medically ready;Other (comment) (patient on vent, SLP will continue to follow)   Angela Nevin, MA, CCC-SLP Speech Therapy

## 2023-02-14 ENCOUNTER — Other Ambulatory Visit: Payer: Self-pay

## 2023-02-14 ENCOUNTER — Ambulatory Visit
Admission: RE | Admit: 2023-02-14 | Discharge: 2023-02-14 | Disposition: A | Discharge status: Home / Self Care | Payer: 59 | Source: Ambulatory Visit | Attending: Radiation Oncology | Admitting: Radiation Oncology

## 2023-02-14 DIAGNOSIS — Z510 Encounter for antineoplastic radiation therapy: Secondary | ICD-10-CM | POA: Diagnosis not present

## 2023-02-14 DIAGNOSIS — C3432 Malignant neoplasm of lower lobe, left bronchus or lung: Secondary | ICD-10-CM | POA: Diagnosis not present

## 2023-02-14 DIAGNOSIS — Z87891 Personal history of nicotine dependence: Secondary | ICD-10-CM | POA: Diagnosis not present

## 2023-02-14 DIAGNOSIS — J9601 Acute respiratory failure with hypoxia: Secondary | ICD-10-CM | POA: Diagnosis not present

## 2023-02-14 LAB — RAD ONC ARIA SESSION SUMMARY
Course Elapsed Days: 27
Plan Fractions Treated to Date: 16
Plan Prescribed Dose Per Fraction: 2 Gy
Plan Total Fractions Prescribed: 27
Plan Total Prescribed Dose: 54 Gy
Reference Point Dosage Given to Date: 32 Gy
Reference Point Session Dosage Given: 2 Gy
Session Number: 19

## 2023-02-14 LAB — BASIC METABOLIC PANEL
Anion gap: 10 (ref 5–15)
BUN: 27 mg/dL — ABNORMAL HIGH (ref 8–23)
CO2: 24 mmol/L (ref 22–32)
Calcium: 8.6 mg/dL — ABNORMAL LOW (ref 8.9–10.3)
Chloride: 106 mmol/L (ref 98–111)
Creatinine, Ser: 0.86 mg/dL (ref 0.44–1.00)
GFR, Estimated: >60 mL/min (ref >60)
Glucose, Bld: 142 mg/dL — ABNORMAL HIGH (ref 70–99)
Potassium: 3.8 mmol/L (ref 3.5–5.1)
Sodium: 140 mmol/L (ref 135–145)

## 2023-02-14 LAB — CBC
HCT: 25.4 % — ABNORMAL LOW (ref 36.0–46.0)
Hemoglobin: 8.2 g/dL — ABNORMAL LOW (ref 12.0–15.0)
MCH: 28 pg (ref 26.0–34.0)
MCHC: 32.3 g/dL (ref 30.0–36.0)
MCV: 86.7 fL (ref 80.0–100.0)
Platelets: 342 10*3/uL (ref 150–400)
RBC: 2.93 MIL/uL — ABNORMAL LOW (ref 3.87–5.11)
RDW: 14.7 % (ref 11.5–15.5)
WBC: 6.6 10*3/uL (ref 4.0–10.5)
nRBC: 0 % (ref 0.0–0.2)

## 2023-02-14 LAB — GLUCOSE, CAPILLARY
Glucose-Capillary: 141 mg/dL — ABNORMAL HIGH (ref 70–99)
Glucose-Capillary: 134 mg/dL — ABNORMAL HIGH (ref 70–99)
Glucose-Capillary: 120 mg/dL — ABNORMAL HIGH (ref 70–99)
Glucose-Capillary: 142 mg/dL — ABNORMAL HIGH (ref 70–99)
Glucose-Capillary: 125 mg/dL — ABNORMAL HIGH (ref 70–99)
Glucose-Capillary: 102 mg/dL — ABNORMAL HIGH (ref 70–99)

## 2023-02-14 LAB — CULTURE, RESPIRATORY W GRAM STAIN

## 2023-02-14 LAB — BPAM RBC: Unit Type and Rh: 5100

## 2023-02-14 LAB — TYPE AND SCREEN: Unit division: 0

## 2023-02-14 NOTE — Progress Notes (Signed)
OT Cancellation Note  Patient Details Name: [name] MRN: [id number] DOB: [birth year]   Cancelled Treatment:    Reason Eval/Treat Not Completed: Other (comment) Patient working with SLP this AM for valve usage and then to transition to radiation. OT to continue to follow and check back as schedule will allow.  Rosalio Loud, MS Acute Rehabilitation Department Office# [phone]  02/14/2023, 11:39 AM

## 2023-02-14 NOTE — Consult Note (Signed)
[facility] [phone]   [phone]  CONSULT NOTE  REFERRING PHYSICIAN: Dr. Felisa Bonier  REASON FOR CONSULTATION:  67 years old African-American female with progressive and recurrent lung cancer  HPI [name] is a 67 y.o. female with past medical history significant for multiple medical problems including history of anxiety, depression, bladder cancer, pneumonia, COPD, GERD, uterine fibroids as well as history of stage IIa (T2b, N0, M0) non-small cell lung cancer, adenocarcinoma diagnosed in November 2021 when she presented with left lower lobe lung mass and suspicious cystic lesion in the right lower lobe.  The patient was seen by cardiothoracic surgery but she was not a good surgical candidate and she was treated with SBRT under the care of Dr. Roselind Messier.  Unfortunately she was lost to follow-up with me since December 2021 but she was followed closely by Dr. Roselind Messier and repeat CT scan of the chest on November 24, 2022 showed a stable large cystic/cavitary lesion in the right lower lobe with medial soft tissue thickening but there was new/progressive metastatic disease most notably with the new large prevascular node and enlarging precarinal and anterior mediastinal nodes.  There was also new small nodules in the right lower lobe and a slightly enlarging nodule in the right lower lobe along the major fissure.  She had a PET scan on December 16, 2022 and that showed interval progression of disease with tracer avid mass within the right hilar region and similar size of the previously reported anterior mediastinal, prevascular and right paratracheal lymph nodes compatible with metabolically active nodal metastasis.  There was no evidence of distant metastatic disease.  The patient had video bronchoscopy with endobronchial ultrasound procedure under the care of Dr. Tonia Brooms on 01/17/2023 and the final pathology from the right middle lobe endobronchial biopsy was consistent  with non-small cell carcinoma, poorly differentiated consistent with primary lung adenocarcinoma.  PD-L1 expression by foundation 1 was 95%.  The patient was admitted to the hospital in early May 2024 with acute on chronic hypoxemic respiratory failure and being treated with pneumonia as well as new onset atrial fibrillation.  She was intubated since 01/16/2023.  She is currently undergoing palliative radiotherapy under the care of Dr. Roselind Messier. I was consulted to evaluate the patient for future treatment options. When seen today she was alert but unfortunately unable to communicate because of the tracheostomy and respiratory failure.  She has no fever or chills and no nausea or vomiting. HPI  Past Medical History:  Diagnosis Date   Abnormal CT lung screening 07/10/2020   Anxiety    Anxiety state 02/28/2012   GAD7 : 6  PHQ9 : 6   Arthritis    Bladder cancer (HCC) 09/2020   urologist--- dr Berneice Heinrich, incidental finding on PET scan 12/ 2021,  s/p TURBT 09-18-2020 high grade papillary urothelial    Cancer related pain 03/11/2022   CAP (community acquired pneumonia) 04/20/2022   Centrilobular emphysema (HCC)    Chronic left shoulder pain 04/29/2021   Close exposure to COVID-19 virus 05/03/2019   Constipation 04/09/2019   Costochondritis, acute 08/14/2020   DDD (degenerative disc disease), lumbar    Depression    DOE (dyspnea on exertion)    10-29-2020  per pt can do house work without sob, when walks/ stairs stops frequently  (stated recovers quickly when stops)   Emphysema lung (HCC)    Epidermoid cyst 10/29/2018   Recurrent. Located on left hip at iliac crest 2" anterior to midaxillary line.  Removed 2013, 2020, 2022.    Epigastric pain 11/16/2020   Fibroid 2003   GERD (gastroesophageal reflux disease)    History of radiation therapy    Left lung- 10/06/20-10/16/20- Dr. Antony Blackbird   Leg cramping 11/13/2019   Lumbar spine pain 01/09/2015   Menopausal vaginal dryness 11/13/2019   Multiple  closed fractures of ribs of left side 08/20/2022   Nocturnal enuresis 06/23/2020   Chronic overnight "leaking" of unknown source. Likely urine, but reports no color or odor. Volume enough for liners or light pads. Occurs every night.    Primary adenocarcinoma of lower lobe of left lung (HCC) 07/2020   oncologist--- dr Arbutus Ped---  dx 11/ 2021 bilateral lower lobe masses,  s/p bronchoscopy w/ bx's 07-28-2020 malignancy cells on left ;  completed SBRT bilateral lower lung 10-16-2020 5 fractions   Radiation-induced dermatitis    on 10-29-2020 pt complaint stomach and chest areas,  completed SBRT 10-16-2020   Seasonal allergies    Stage 3 severe COPD by GOLD classification [facility])    pulmonologist--- dr Tonia Brooms,  using anoro inhaler daily   Wears glasses     Past Surgical History:  Procedure Laterality Date   ANTERIOR CERVICAL DECOMP/DISCECTOMY FUSION  03-08-2003 @ [facility]   C3 --- C6   ARTHROTOMY Right 11/09/2015   Procedure: RIGHT WRIST DISTAL RADIAL ULNAR JOINT ARTHROTOMY AND DEBRIDEMENT,  AND ;  Surgeon: Bradly Bienenstock, MD;  Location: [facility];  Service: Orthopedics;  Laterality: Right;   BRONCHIAL BIOPSY  07/28/2020   Procedure: BRONCHIAL BIOPSIES;  Surgeon: Josephine Igo, DO;  Location: [facility];  Service: Pulmonary;;   BRONCHIAL BIOPSY  08/17/2021   Procedure: BRONCHIAL BIOPSIES;  Surgeon: Josephine Igo, DO;  Location: [facility];  Service: Pulmonary;;   BRONCHIAL BIOPSY  01/17/2023   Procedure: BRONCHIAL BIOPSIES;  Surgeon: Josephine Igo, DO;  Location: [facility];  Service: Cardiopulmonary;;   BRONCHIAL BRUSHINGS  07/28/2020   Procedure: BRONCHIAL BRUSHINGS;  Surgeon: Josephine Igo, DO;  Location: [facility];  Service: Pulmonary;;   BRONCHIAL BRUSHINGS  08/17/2021   Procedure: BRONCHIAL BRUSHINGS;  Surgeon: Josephine Igo, DO;  Location: [facility];  Service: Pulmonary;;   BRONCHIAL NEEDLE ASPIRATION BIOPSY  07/28/2020   Procedure: BRONCHIAL NEEDLE ASPIRATION BIOPSIES;   Surgeon: Josephine Igo, DO;  Location: [facility];  Service: Pulmonary;;   BRONCHIAL NEEDLE ASPIRATION BIOPSY  01/17/2023   Procedure: BRONCHIAL NEEDLE ASPIRATION BIOPSIES;  Surgeon: Josephine Igo, DO;  Location: [facility];  Service: Cardiopulmonary;;   BRONCHIAL WASHINGS  07/28/2020   Procedure: BRONCHIAL WASHINGS;  Surgeon: Josephine Igo, DO;  Location: [facility];  Service: Pulmonary;;   BRONCHIAL WASHINGS  08/17/2021   Procedure: BRONCHIAL WASHINGS;  Surgeon: Josephine Igo, DO;  Location: [facility];  Service: Pulmonary;;   COLONOSCOPY     CYSTOSCOPY WITH RETROGRADE PYELOGRAM, URETEROSCOPY AND STENT PLACEMENT N/A 09/18/2020   Procedure: CYSTOSCOPY WITH RETROGRADE PYELOGRAM bilateral,URETEROSCOPY AND  STENT PLACEMENT right ureter;  Surgeon: Sebastian Ache, MD;  Location: [facility];  Service: Urology;  Laterality: N/A;  1 HR   HARDWARE REMOVAL Right 07/24/2013   Procedure: RIGHT WRIST HARDWARE REMOVAL, JOINT RELEASE, RIGHT HAND MANIPULATION UNDER ANESTHESIA;  Surgeon: Sharma Covert, MD;  Location: [facility];  Service: Orthopedics;  Laterality: Right;   MASS EXCISION Left 10/14/2021   Procedure: EXCISION LEFT BACK CYSTIC MASS;  Surgeon: Sheliah Hatch, De Blanch, MD;  Location: [facility];  Service: General;  Laterality: Left;   OPEN REDUCTION  INTERNAL FIXATION (ORIF) DISTAL RADIAL FRACTURE Right 11/08/2012   Procedure: OPEN REDUCTION INTERNAL FIXATION (ORIF) RIGHT DISTAL RADIUS FRACTURE;  Surgeon: Sharma Covert, MD;  Location: [facility];  Service: Orthopedics;  Laterality: Right;   TRANSURETHRAL RESECTION OF BLADDER TUMOR N/A 09/18/2020   Procedure: TRANSURETHRAL RESECTION OF BLADDER TUMOR (TURBT);  Surgeon: Sebastian Ache, MD;  Location: [facility];  Service: Urology;  Laterality: N/A;   TRANSURETHRAL RESECTION OF BLADDER TUMOR N/A 11/04/2020   Procedure: RESTAGING TRANSURETHRAL RESECTION OF BLADDER TUMOR (TURBT); BILATERAL RETROGRADE  PYELOGRAM;  Surgeon: Sebastian Ache, MD;  Location: [facility];  Service: Urology;  Laterality: N/A;  1 HR   VIDEO BRONCHOSCOPY  01/17/2023   Procedure: VIDEO BRONCHOSCOPY WITHOUT FLUORO;  Surgeon: Josephine Igo, DO;  Location: [facility];  Service: Cardiopulmonary;;   VIDEO BRONCHOSCOPY WITH ENDOBRONCHIAL NAVIGATION N/A 07/28/2020   Procedure: VIDEO BRONCHOSCOPY WITH ENDOBRONCHIAL NAVIGATION;  Surgeon: Josephine Igo, DO;  Location: [facility];  Service: Pulmonary;  Laterality: N/A;   VIDEO BRONCHOSCOPY WITH RADIAL ENDOBRONCHIAL ULTRASOUND  08/17/2021   Procedure: VIDEO BRONCHOSCOPY WITH RADIAL ENDOBRONCHIAL ULTRASOUND;  Surgeon: Josephine Igo, DO;  Location: [facility];  Service: Pulmonary;;   WRIST ARTHROPLASTY Right 11/09/2015   Procedure: OR DISTAL ULNAR RESECTION ARTHROPLASTY ;  Surgeon: Bradly Bienenstock, MD;  Location: [facility];  Service: Orthopedics;  Laterality: Right;   WRIST ARTHROSCOPY WITH DEBRIDEMENT Right 07/21/2014   Procedure: RIGHT WRIST DISTAL RADIAL ULNAR JOINT DEBRIDEMENT AND JOINT RELEASE POSSIBLE TENDON INTERPOSITION;  Surgeon: Sharma Covert, MD;  Location: [facility];  Service: Orthopedics;  Laterality: Right;    Family History  Problem Relation Age of Onset   Hypertension Mother    Hyperlipidemia Mother    Dementia Father    Lung cancer Father    Parkinson's disease Father    Early death Sister 32       Drug overdose   Throat cancer Brother    Hypertension Daughter    Diabetes Daughter     Social History Social History   Tobacco Use   Smoking status: Former    Packs/day: 1.00    Years: 50.00    Additional pack years: 0.00    Total pack years: 50.00    Types: Cigarettes    Start date: 04/13/1975    Quit date: 07/14/2020    Years since quitting: 2.5    Passive exposure: Past   Smokeless tobacco: Never  Vaping Use   Vaping Use: Never used  Substance Use Topics   Alcohol use: Yes    Comment: Holidays only   Drug use: Never     Allergies  Allergen Reactions   Codeine Nausea And Vomiting and Other (See Comments)    Hallucinations   Prednisone Other (See Comments)    'Made me feel funny inside my head"-pt cannot be specific    Current Facility-Administered Medications  Medication Dose Route Frequency Provider Last Rate Last Admin   0.9 %  sodium chloride infusion   Intravenous PRN Westley Chandler, MD   Stopped at 02/02/23 0541   acetaminophen (TYLENOL) 160 MG/5ML solution 650 mg  650 mg Per Tube Q6H PRN Lorin Glass, MD   650 mg at 02/13/23 2000   albuterol (PROVENTIL) (2.5 MG/3ML) 0.083% nebulizer solution 2.5 mg  2.5 mg Nebulization Q2H PRN Westley Chandler, MD   2.5 mg at 02/09/23 0444   amLODipine (NORVASC) tablet 10 mg  10 mg Per Tube Daily Coralyn Helling, MD   10  mg at 02/12/23 1002   apixaban (ELIQUIS) tablet 5 mg  5 mg Per Tube BID Norva Pavlov, RPH   5 mg at 02/14/23 1029   arformoterol (BROVANA) nebulizer solution 15 mcg  15 mcg Nebulization BID Steffanie Dunn, DO   15 mcg at 02/14/23 1610   atorvastatin (LIPITOR) tablet 40 mg  40 mg Per Tube QHS Bevelyn Ngo, NP   40 mg at 02/13/23 2117   bethanechol (URECHOLINE) tablet 10 mg  10 mg Per Tube TID Tim Lair, PA-C   10 mg at 02/14/23 1545   budesonide (PULMICORT) nebulizer solution 0.5 mg  0.5 mg Nebulization BID Cyril Mourning V, MD   0.5 mg at 02/14/23 0742   busPIRone (BUSPAR) tablet 20 mg  20 mg Per Tube BID Coralyn Helling, MD   20 mg at 02/14/23 1028   calcium carbonate (TUMS - dosed in mg elemental calcium) chewable tablet 200 mg of elemental calcium  1 tablet Per Tube BID Bevelyn Ngo, NP   200 mg of elemental calcium at 02/14/23 1028   Chlorhexidine Gluconate Cloth 2 % PADS 6 each  6 each Topical Daily Leslye Peer, MD   6 each at 02/14/23 1130   citalopram (CELEXA) tablet 20 mg  20 mg Per Tube Daily Coralyn Helling, MD   20 mg at 02/14/23 1029   clonazePAM (KLONOPIN) disintegrating tablet 2 mg  2 mg Per Tube TID Hunsucker,  Lesia Sago, MD   2 mg at 02/14/23 1543   dexmedetomidine (PRECEDEX) 400 MCG/100ML (4 mcg/mL) infusion  0-0.8 mcg/kg/hr Intravenous Titrated Lanier Clam, NP 16.22 mL/hr at 02/14/23 1655 0.8 mcg/kg/hr at 02/14/23 1655   docusate (COLACE) 50 MG/5ML liquid 100 mg  100 mg Per Tube Daily PRN Lanier Clam, NP       famotidine (PEPCID) tablet 20 mg  20 mg Per Tube BID Bevelyn Ngo, NP   20 mg at 02/14/23 1027   feeding supplement (OSMOLITE 1.5 CAL) liquid 1,000 mL  1,000 mL Per Tube Continuous Martina Sinner, MD 55 mL/hr at 02/14/23 1626 Infusion Verify at 02/14/23 1626   feeding supplement (PROSource TF20) liquid 60 mL  60 mL Per Tube Daily Cheri Fowler, MD   60 mL at 02/14/23 1030   ferrous sulfate 300 (60 Fe) MG/5ML syrup 300 mg  300 mg Per Tube Daily Lorin Glass, MD   300 mg at 02/14/23 1030   gabapentin (NEURONTIN) capsule 600 mg  600 mg Per Tube TID Lorin Glass, MD   600 mg at 02/14/23 1545   guaiFENesin (ROBITUSSIN) 100 MG/5ML liquid 10 mL  10 mL Per Tube Q4H Omar Person, MD   10 mL at 02/14/23 1545   HYDROcodone bit-homatropine (HYCODAN) 5-1.5 MG/5ML syrup 5 mL  5 mL Per Tube Q4H PRN Coralyn Helling, MD       hydrocortisone (ANUSOL-HC) 2.5 % rectal cream   Rectal BID Alfredo Martinez, MD   Given at 02/14/23 1040   HYDROmorphone (DILAUDID) injection 0.5-1 mg  0.5-1 mg Intravenous Q3H PRN Lanier Clam, NP   1 mg at 02/14/23 1130   HYDROmorphone (DILAUDID) tablet 1 mg  1 mg Per Tube Q4H Bowser, Kaylyn Layer, NP   1 mg at 02/14/23 1545   hydrOXYzine (ATARAX) 10 MG/5ML syrup 50 mg  50 mg Per Tube TID PRN Lanier Clam, NP   50 mg at 02/12/23 9604   lactated ringers infusion   Intravenous Continuous Sood, Vineet,  MD 50 mL/hr at 02/14/23 1626 Infusion Verify at 02/14/23 1626   lidocaine (LIDODERM) 5 % 1 patch  1 patch Transdermal Q12H PRN Lanier Clam, NP   1 patch at 02/10/23 1520   lip balm (CARMEX) ointment   Topical PRN Lanier Clam, NP   Given at 02/10/23 1619    methocarbamol (ROBAXIN) tablet 500 mg  500 mg Per Tube TID Lorin Glass, MD   500 mg at 02/14/23 1544   midazolam (VERSED) injection 1-2 mg  1-2 mg Intravenous Q1H PRN Luciano Cutter, MD   2 mg at 02/14/23 0732   mirtazapine (REMERON) tablet 15 mg  15 mg Per Tube QHS Bevelyn Ngo, NP   15 mg at 02/13/23 2116   multivitamin with minerals tablet 1 tablet  1 tablet Per Tube Daily Bevelyn Ngo, NP   1 tablet at 02/14/23 1029   naloxone (NARCAN) injection 0.4 mg  0.4 mg Intravenous PRN Fayette Pho, MD       norepinephrine (LEVOPHED) 4mg  in (0.016 mg/mL) premix infusion  0-40 mcg/min Intravenous Titrated Janeann Forehand D, NP 18.75 mL/hr at 02/14/23 1708 5 mcg/min at 02/14/23 1708   nystatin (MYCOSTATIN/NYSTOP) topical powder   Topical PRN Martina Sinner, MD   1 Application at 02/07/23 0012   ondansetron (ZOFRAN) injection 4 mg  4 mg Intravenous Q8H PRN Solon Augusta S, PA   4 mg at 01/29/23 0836   Oral care mouth rinse  15 mL Mouth Rinse Q2H Cheri Fowler, MD   15 mL at 02/14/23 1554   Oral care mouth rinse  15 mL Mouth Rinse PRN Cheri Fowler, MD       polyethylene glycol (MIRALAX / GLYCOLAX) packet 17 g  17 g Per Tube Daily PRN Bowser, Kaylyn Layer, NP       QUEtiapine (SEROQUEL) tablet 100 mg  100 mg Per Tube BID Lorin Glass, MD   100 mg at 02/14/23 1029   revefenacin (YUPELRI) nebulizer solution 175 mcg  175 mcg Nebulization Daily Steffanie Dunn, DO   175 mcg at 02/14/23 1610   sennosides (SENOKOT) 8.8 MG/5ML syrup 10 mL  10 mL Per Tube QHS PRN Bowser, Kaylyn Layer, NP       sodium chloride flush (NS) 0.9 % injection 10-40 mL  10-40 mL Intracatheter Q12H Cheri Fowler, MD   10 mL at 02/14/23 1027   sodium chloride flush (NS) 0.9 % injection 10-40 mL  10-40 mL Intracatheter PRN Cheri Fowler, MD       sodium chloride HYPERTONIC 3 % nebulizer solution 4 mL  4 mL Nebulization BID Omar Person, MD   4 mL at 02/14/23 9604    Review of Systems  Constitutional: positive for  anorexia, fatigue, and weight loss Eyes: negative Ears, nose, mouth, throat, and face: negative Respiratory: positive for cough and dyspnea on exertion Cardiovascular: negative Gastrointestinal: negative Genitourinary:negative Integument/breast: negative Hematologic/lymphatic: negative Musculoskeletal:negative Neurological: negative Behavioral/Psych: negative Endocrine: negative Allergic/Immunologic: negative  Physical Exam  [id number]:[id number], distressed, and ill looking SKIN: skin color, texture, turgor are normal HEAD: Normocephalic, No masses, lesions, tenderness or abnormalities EYES: normal, PERRLA EARS: External ears normal, Canals clear OROPHARYNX:no exudate and no erythema  NECK: supple, no adenopathy LYMPH:  no palpable lymphadenopathy BREAST:not examined LUNGS: decreased breath sounds, prolonged expiratory phase HEART: regular rate & rhythm and no murmurs ABDOMEN:abdomen soft, non-tender, normal bowel sounds, and no masses or organomegaly BACK: Back symmetric, no curvature., No CVA tenderness EXTREMITIES:no joint deformities,  effusion, or inflammation, no edema  NEURO: alert & oriented x 3 with fluent speech, no focal motor/sensory deficits  PERFORMANCE STATUS: ECOG 2  LABORATORY DATA: Lab Results  Component Value Date   WBC 6.6 02/14/2023   HGB 8.2 (L) 02/14/2023   HCT 25.4 (L) 02/14/2023   MCV 86.7 02/14/2023   PLT 342 02/14/2023    @LASTCHEM @  RADIOGRAPHIC STUDIES: DG Chest Port 1 View  Result Date: 02/13/2023 CLINICAL DATA:  40981 Respiratory failure (HCC) 19147 EXAM: PORTABLE CHEST 1 VIEW COMPARISON:  CXR 02/09/23 FINDINGS: Weighted enteric tube courses below diaphragm is visualized to the level of the stomach. Right-sided PICC with the tip in the lower SVC. Cardiomegaly. Small bilateral pleural effusions, increased on the right. There are prominent bilateral interstitial opacities are favored to represent mild pulmonary edema. No radiographically apparent  displaced rib fractures. Visualized upper abdomen is unremarkable. Partially visualized cervical spinal fusion hardware in place. Degenerative changes of the bilateral glenohumeral joints. IMPRESSION: 1. Cardiomegaly with small bilateral pleural effusions, slightly increased on the right. 2. Prominent bilateral interstitial opacities are favored to represent mild pulmonary edema. Electronically Signed   By: Lorenza Cambridge M.D.   On: 02/13/2023 07:38   DG Chest 1 View  Result Date: 02/09/2023 CLINICAL DATA:  67 year old female with pneumonia, lung cancer. EXAM: CHEST  1 VIEW COMPARISON:  Portable chest 01/30/2023 and earlier. Most recent chest CT, CTA 01/16/2023. FINDINGS: Portable AP semi upright view at 0935 hours. Stable tracheostomy. Enteric feeding tube tip projects at the mid to distal gastric body. Increased gas within the stomach now. Stable right PICC line. Ongoing abnormal bilateral lung base opacity, mildly improved on the right since 01/30/2023. Stable cardiac size and mediastinal contours. No superimposed pneumothorax or pulmonary edema. Improved costophrenic angle ventilation, small pleural effusions may have resolved. No areas of worsening ventilation. Stable visualized osseous structures. Prior cervical ACDF. IMPRESSION: 1. Ongoing bibasilar lung opacity with only mildly improved ventilation since 01/30/2023. No new cardiopulmonary abnormality. 2. Stable lines and tubes; enteric feeding tube terminates at the mid or distal 3rd stomach. Electronically Signed   By: Odessa Fleming M.D.   On: 02/09/2023 11:27   DG Abd 1 View  Result Date: 02/04/2023 CLINICAL DATA:  Nasogastric tube placement. EXAM: ABDOMEN - 1 VIEW COMPARISON:  01/27/2023 FINDINGS: Tip of the weighted enteric tube is in the left upper quadrant in the region of the mid stomach. Nonspecific upper abdominal bowel gas pattern without significant bowel distention. IMPRESSION: Tip of the weighted enteric tube in the mid stomach. Electronically  Signed   By: Narda Rutherford M.D.   On: 02/04/2023 20:18   DG Chest Port 1 View  Result Date: 01/30/2023 CLINICAL DATA:  Respiratory failure EXAM: PORTABLE CHEST 1 VIEW COMPARISON:  CXR 01/27/23 FINDINGS: Tracheostomy in place. Partially visualized cervical spinal fusion hardware in place. Enteric tube courses below diaphragm with the tip out of the field of view. Right arm PICC with the tip near the cavoatrial junction. Redemonstrated are small bilateral pleural effusions, unchanged from prior exam. Compared to prior exam there is interval increase in bilateral infrahilar airspace opacities could represent atelectasis or infection. No radiographically apparent displaced rib fractures. Visualized upper abdomen is unremarkable. IMPRESSION: 1. Compared to prior exam there is interval increase in bilateral infrahilar airspace opacities that could represent atelectasis, infection, or changes related to aspiration. 2. Unchanged small bilateral pleural effusions. Electronically Signed   By: Lorenza Cambridge M.D.   On: 01/30/2023 10:26   DG Abd 1 View  Result Date: 01/27/2023 CLINICAL DATA:  Nasogastric tube placement EXAM: ABDOMEN - 1 VIEW COMPARISON:  01/26/2023 FINDINGS: Partially visualized tracheostomy tube tip. Esophageal tube tip overlies the gastric fundal area. Mild nonspecific air distension of bowel. Airspace disease at both bases. IMPRESSION: Esophageal tube tip overlies the gastric fundal area. Electronically Signed   By: Jasmine Pang M.D.   On: 01/27/2023 17:23   DG Chest Port 1 View  Result Date: 01/27/2023 CLINICAL DATA:  Status post tracheostomy EXAM: PORTABLE CHEST 1 VIEW COMPARISON:  01/26/2023 FINDINGS: Single frontal view of the chest demonstrates interval placement of a tracheostomy, tip overlying tracheal air column midway between thoracic inlet and carina. Right-sided PICC unchanged. The enteric catheter has been removed. The cardiac silhouette is stable. Persistent bibasilar consolidation  and small bilateral effusions, right greater than left. No pneumothorax. IMPRESSION: 1. No complication after tracheostomy placement. 2. Persistent bibasilar consolidation and effusions, right greater than left. No appreciable change since prior exam. Electronically Signed   By: Sharlet Salina M.D.   On: 01/27/2023 15:10   DG Abd 1 View  Result Date: 01/26/2023 CLINICAL DATA:  Nasogastric tube placement EXAM: ABDOMEN - 1 VIEW COMPARISON:  Yesterday FINDINGS: Nasogastric terminates over the distal gastric body. Numerous leads and wires project over the lower chest and upper abdomen. Gastric distention is improved, mild. No gross free intraperitoneal air or bowel obstruction. Bibasilar airspace disease. IMPRESSION: Nasogastric tube terminating at the distal gastric body. Electronically Signed   By: Jeronimo Greaves M.D.   On: 01/26/2023 17:04   DG CHEST PORT 1 VIEW  Result Date: 01/26/2023 CLINICAL DATA:  Respiratory failure EXAM: PORTABLE CHEST 1 VIEW COMPARISON:  01/25/2023 FINDINGS: Support apparatus: Endotracheal tube terminates 2.0 cm above carina. Right-sided PICC line terminates at the low SVC. Nasogastric tube extends beyond the inferior aspect of the film. Numerous leads and wires project over the chest. Heart/mediastinum: Midline trachea. Normal heart size for level of inspiration. Pleura: Suspect tiny bilateral pleural effusions. No apical pneumothorax. Relative lucency in the inferolateral right hemithorax is similar over prior exams and favored to represent relatively well-aerated lateral right lower lobe in the setting of bibasilar airspace disease. Lungs: Improved aeration. Decreased right-greater-than-left interstitial opacities. Persistent, slightly improved bibasilar airspace disease. Other: None IMPRESSION: Improved aeration, likely due to a combination of decreased interstitial edema and pneumonia. Probable small bilateral pleural effusions. Electronically Signed   By: Jeronimo Greaves M.D.   On:  01/26/2023 17:02   DG Abd 1 View  Result Date: 01/25/2023 CLINICAL DATA:  Orogastric tube placement. EXAM: ABDOMEN - 1 VIEW COMPARISON:  Radiographs 01/17/2023. FINDINGS: 1211 hours. Enteric tube projects over the distal stomach. The stomach appears moderately distended. No significant bowel distention identified. No supine evidence of pneumoperitoneum. Multiple telemetry leads overlie the abdomen. IMPRESSION: Enteric tube projects over the distal stomach. The stomach appears moderately distended. Electronically Signed   By: Carey Bullocks M.D.   On: 01/25/2023 12:45   DG CHEST PORT 1 VIEW  Result Date: 01/25/2023 CLINICAL DATA:  Intubation and orogastric tube placement. EXAM: PORTABLE CHEST 1 VIEW COMPARISON:  Radiographs 01/23/2023 and 01/21/2023.  CT 01/16/2023. FINDINGS: 1209 hours. Tip of the endotracheal tube is 2.0 cm above the carina, similar to prior study. Enteric tube projects below the diaphragm. There is a right arm PICC which projects to the level of the superior cavoatrial junction. The heart size and mediastinal contours are stable. Persistent bibasilar airspace opacities and small bilateral pleural effusions with interval increased diffuse interstitial opacities suspicious for  superimposed edema. No evidence of pneumothorax or acute osseous abnormality. IMPRESSION: 1. Endotracheal tube tip remains 2.0 cm above the carina. 2. Increased interstitial opacities suspicious for superimposed edema. Otherwise stable basilar airspace opacities and small pleural effusions. Electronically Signed   By: Carey Bullocks M.D.   On: 01/25/2023 12:43   DG Chest Port 1 View  Result Date: 01/23/2023 CLINICAL DATA:  Hypoxia EXAM: PORTABLE CHEST 1 VIEW COMPARISON:  Chest radiograph dated 01/21/2023 FINDINGS: Lines/tubes: Endotracheal tube tip projects 2.3 cm above the carina. Gastric/enteric tube tip projects over the stomach. Right upper extremity PICC tip projects over the superior cavoatrial junction.  Chest: Low lung volumes with bronchovascular crowding. Similar bibasilar hazy and interstitial opacities. Pleura: Similar small bilateral pleural effusions.  No pneumothorax. Heart/mediastinum: Similar  cardiomediastinal silhouette. Bones: Cervical spinal fixation hardware appears intact. IMPRESSION: 1. Similar mild pulmonary edema. Similar small bilateral pleural effusions. 2. Bibasilar hazy opacities may reflect atelectasis, aspiration, or pneumonia. 3. Support lines and tubes as above. Electronically Signed   By: Agustin Cree M.D.   On: 01/23/2023 10:23   DG Chest Port 1 View  Result Date: 01/21/2023 CLINICAL DATA:  Acute respiratory failure EXAM: PORTABLE CHEST 1 VIEW COMPARISON:  None Available. FINDINGS: No pneumothorax. The ETT is in good position. A right PICC line terminates in the SVC. The OG tube/NG tube terminates in the stomach. The cardiomediastinal silhouette is stable. Persistent opacity in the medial right lung base and and the left retrocardiac region, stable. Small effusions stable. No other interval changes. IMPRESSION: 1. Support apparatus as above. 2. Stable bibasilar opacities and small effusions. No other interval changes. Electronically Signed   By: Gerome Sam III M.D.   On: 01/21/2023 09:50   DG CHEST PORT 1 VIEW  Result Date: 01/19/2023 CLINICAL DATA:  Respiratory failure. EXAM: PORTABLE CHEST 1 VIEW COMPARISON:  01/17/2023 FINDINGS: The ET tube tip is 1.6 cm above the carina. Enteric tube tip and side port are below the level of the GE junction. Right arm PICC line tip is at the superior cavoatrial junction. Stable cardiomediastinal contours. Mild diffuse increase interstitial markings concerning for pulmonary edema. Extensive airspace consolidation within both lower lobes is unchanged in the interval. IMPRESSION: 1. No change in aeration to the lungs compared with previous exam. 2. Stable support apparatus. 3. Persistent bilateral lower lobe airspace consolidation.  Electronically Signed   By: Signa Kell M.D.   On: 01/19/2023 07:37   DG CHEST PORT 1 VIEW  Result Date: 01/17/2023 CLINICAL DATA:  Hypoxia EXAM: PORTABLE CHEST 1 VIEW COMPARISON:  Chest x-ray 01/17/2023.  Chest CT 01/16/2023. FINDINGS: Endotracheal tube tip is 2.5 cm above the carina. Enteric tube tip is in the stomach. Right upper extremity PICC terminates in the SVC. Right lower lung masslike opacity is unchanged. There is a small right pleural effusion which has increased. No pneumothorax. Cardiac silhouette appears stable. IMPRESSION: 1. Right lower lung masslike opacity is unchanged. 2. Small right pleural effusion which has increased. 3. Support apparatus as described. Electronically Signed   By: Darliss Cheney M.D.   On: 01/17/2023 23:04   DG Abd 1 View  Result Date: 01/17/2023 CLINICAL DATA:  Nasogastric tube EXAM: ABDOMEN - 1 VIEW COMPARISON:  Abdominal x-ray 01/17/2023 FINDINGS: The enteric tube tip and side-port are in the stomach. The tip of the tube is near the gastric fundus. Position is unchanged from prior. Examination is otherwise stable. IMPRESSION: The enteric tube tip and side-port are in the stomach. Electronically Signed  By: Darliss Cheney M.D.   On: 01/17/2023 18:48   [place] EKG SITE RITE  Result Date: 01/17/2023 If Site Rite image not attached, placement could not be confirmed due to current cardiac rhythm.  DG Abd Portable 1V  Result Date: 01/17/2023 CLINICAL DATA:  OG tube placement EXAM: PORTABLE ABDOMEN - 1 VIEW COMPARISON:  None Available. FINDINGS: OG tube tip and side port are in the stomach. Mild gaseous distention of multiple bowel loops, predominantly colon. Consolidation of the right lower lobe. IMPRESSION: OG tube tip and side port are in the stomach. Electronically Signed   By: Allegra Lai M.D.   On: 01/17/2023 17:15   DG Chest Port 1 View  Result Date: 01/17/2023 CLINICAL DATA:  Provided history: Respiratory failure. EXAM: PORTABLE CHEST 1 VIEW COMPARISON:   Prior chest radiographs 01/16/2023 and earlier. Chest CT 01/16/2023. FINDINGS: ET tube present with tip 1.2 cm above the level of the carina. An enteric tube passes below the level of the left hemidiaphragm, exits the field of view, and returns within the field of view terminating at the expected level of the gastric fundus. The cardiomediastinal silhouette is unchanged. Aortic atherosclerosis. Known right hilar lung mass with mediastinal invasion, better delineated on the recent prior chest CT of 01/16/2023. Persistent postobstructive atelectasis and consolidation within the right middle and right lower lobes, similar to the prior chest radiograph of 01/16/2023. Persistent mild ill-defined opacity within the left lung base which may reflect atelectasis and/or airspace consolidation. No evidence of pleural effusion or pneumothorax. No acute osseous abnormality identified. IMPRESSION: 1. Support apparatus as described. 2. Known right hilar lung mass with mediastinal invasion, better delineated on the recent prior chest CT of 01/16/2023. 3. Postobstructive consolidation and atelectasis within the right middle and right lower lobes, similar to the prior chest radiograph of 01/16/2023. 4. Mild atelectasis and/or airspace consolidation within the left lung base, unchanged from the prior chest radiograph. 5.  Aortic Atherosclerosis (ICD10-I70.0). Electronically Signed   By: Jackey Loge D.O.   On: 01/17/2023 08:16   DG CHEST PORT 1 VIEW  Result Date: 01/16/2023 CLINICAL DATA:  Intubation. EXAM: PORTABLE CHEST 1 VIEW COMPARISON:  Jan 15, 2023. FINDINGS: Stable cardiomediastinal silhouette. Endotracheal and nasogastric tubes are in grossly good position. Stable bibasilar opacities are noted, right greater than left, concerning for subsegmental atelectasis or infiltrates and associated pleural effusions. Bony thorax is unremarkable. IMPRESSION: Endotracheal and nasogastric tubes are in grossly good position. Stable  bibasilar opacities as described above. Electronically Signed   By: Lupita Raider M.D.   On: 01/16/2023 13:14   CT Angio Chest Pulmonary Embolism (PE) W or WO Contrast  Result Date: 01/16/2023 CLINICAL DATA:  Shortness of breath. Rule out pulmonary embolus. History of lung cancer. * Tracking Code: BO *. EXAM: CT ANGIOGRAPHY CHEST WITH CONTRAST TECHNIQUE: Multidetector CT imaging of the chest was performed using the standard protocol during bolus administration of intravenous contrast. Multiplanar CT image reconstructions and MIPs were obtained to evaluate the vascular anatomy. RADIATION DOSE REDUCTION: This exam was performed according to the departmental dose-optimization program which includes automated exposure control, adjustment of the mA and/or kV according to patient size and/or use of iterative reconstruction technique. CONTRAST:  75mL OMNIPAQUE IOHEXOL 350 MG/ML SOLN COMPARISON:  PET-CT 12/16/2022 and CT chest 11/24/22. FINDINGS: Cardiovascular: Satisfactory opacification of the pulmonary arteries to the segmental level. No evidence of pulmonary embolism. Cardiac enlargement. No pericardial effusion. Aortic atherosclerosis. Mediastinum/Nodes: Thyroid gland appears normal. The trachea appears patent and midline.  Unremarkable appearance of the esophagus. Bilateral mediastinal nodal metastasis identified. Index left pre-vascular lymph node measures 2.1 cm, image 42/6. Previously 1.9 cm. Right paratracheal nodal mass measures 2.3 x 2.0 cm, image 45/6. Formally this measured 1.1 x 1.1 cm. Left paratracheal node is new measuring 1.3 cm, image 44/6. Lungs/Pleura: There is a large right hilar lung mass with signs of mediastinal invasion. This measures 5.8 x 4.4 cm, image 58/6. Formally this measured approximately 4.5 x 4.2 cm. This mass encases and narrows the right middle lobe and right lower lobar pulmonary arteries and their proximal segmental branches. The tumor encases and completely occludes the right  lower lobe bronchus with new marked postobstructive consolidation and volume loss within the right lower lobe, image 57/6. Encasement and narrowing of the right middle lobe bronchus is also identified with new partial atelectasis of the right middle lobe. New diffuse airspace consolidation involving nearly the entire left lower lobe which may reflect aspiration or pneumonia. Moderate to severe changes of emphysema. Upper Abdomen: No acute abnormality. Musculoskeletal: No acute or suspicious osseous findings. 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 Review of the MIP images confirms the above findings. IMPRESSION: 1. No evidence for acute pulmonary embolus. 2. Interval increase in size of right hilar lung mass with signs of mediastinal invasion. This mass encases and narrows the right middle lobe and right lower lobar pulmonary arteries and their proximal segmental branches. The tumor encases and completely occludes the right lower lobe bronchus with new marked postobstructive consolidation and volume loss within the right lower lobe. Encasement and narrowing of the right middle lobe bronchus is also identified with new partial atelectasis of the right middle lobe. 3. New diffuse airspace consolidation involving nearly the entire left lower lobe which may reflect aspiration or pneumonia. 4. Interval progression of mediastinal nodal metastasis. 5.  Aortic Atherosclerosis (ICD10-I70.0). Electronically Signed   By: Signa Kell M.D.   On: 01/16/2023 12:16    ASSESSMENT: This is a very pleasant 67 years old African-American female with recurrent metastatic non-small cell lung cancer presented with obstructive right hilar mass in addition to mediastinal lymphadenopathy and she is currently intubated.  The patient has a history of stage Ia non-small cell lung cancer in December 2021 status post SBRT to the left lung lesion. PD-L1 expression is 95% with no actionable mutations.  PLAN: I  had a lengthy discussion with the patient today about her current condition and possible future treatment options. I personally and independently reviewed her imaging studies. Unfortunately she is not feeling well and she is currently intubated with tracheostomy tube in place She is currently undergoing palliative radiotherapy under the care of Dr. Roselind Messier hopefully this may help opening her airway to relieve the respiratory distress. If the patient has improvement in her condition and her performance status improves, I may consider her for treatment with immunotherapy because of the high PD-L1 expression or in combination with chemotherapy if her performance status is much better. I do not see a role for any systemic chemotherapy at this point especially with her condition but this can be discussed in the future if her respiratory failure improves. If no improvement in her respiratory status, she may be a good candidate for discussion of palliative care and hospice. Thank you so much for allowing me to participate in the care of Ms. Fujita, please call if you have any questions.  The patient voices understanding of current disease status and treatment options and is in agreement  with the current care plan.  All questions were answered. The patient knows to call the clinic with any problems, questions or concerns. We can certainly see the patient much sooner if necessary.  Thank you so much for allowing me to participate in the care of [name]. I will continue to follow up the patient with you and assist in her care.  Disclaimer: This note was dictated with voice recognition software. Similar sounding words can inadvertently be transcribed and may not be corrected upon review.   Lajuana Matte February 14, 2023, 5:42 PM

## 2023-02-14 NOTE — Progress Notes (Signed)
Oncology short note  Medical oncology consulted. Patient already receiving RT  for recurrent lung cancer with airway obstruction. Informed Dr Shirline Frees her medical oncologist for any additional input he might have regarding concurrent treatment options .  Wyvonnia Lora MD MS

## 2023-02-14 NOTE — Progress Notes (Signed)
Speech Language Pathology Treatment: [name] Speaking valve  Patient Details Name: [name] MRN: [id number] DOB: 1956/03/28 Today's Date: 02/14/2023 Time: 4098-1191 SLP Time Calculation (min) (ACUTE ONLY): 39 min  Assessment / Plan / Recommendation Clinical Impression  [name] valve on vent placed for approx 20 minutes, [name] valve on trach collar placed for approximately 6 minutes; pt's vitals varied from baseline HR 123, oxygen sats 95% and RR 24. During valve trial, pt with HR stables in 120s, Oxygen saturation mostly 95-96%, RR 22-26. Pt spoke to her daughter on the telephone. Able to verbalize up to 5 words per breath group - with excellent voicing and breathing coordination. Her voice is raspy with ? vocal cord closure even on inhalation? She denies anxiety during session and tolerated valve well throughout the session- one time vent valve was removed to allow suction of secretions due to excessive coughing - alleviated with suction. Use of [name] valve tolerated for a few minutes - but phonatory strength was much less than while on vent and she appeared to fatigue quickly - ? if this was due to fatigue from speaking. Upon removal of [name] valve after one minute, no back pressure observed - however after approx 5 minutes - pt admitted to fatigue - and when valve removed - significant back pressure noted. Pt was encouraged during session - and she demonstrated excellent verbal communication. SLP provided her with communication board to allow her nonverbal communication with staff. SLP will continue to follow for PMSV tolerance - hopefully when pt is able to tolerate trach collar, she can use pruple PMSV with nursing/technician, etc as with use of [name] vent=valve - RN has to be present to adapt vent settings. Excellent progress today and pt expressed gratitude to staff for helping her.     HPI HPI: Patient is a 67 y.o. female with PMH: COPD, recurrent adenocarcinoma of LLL, anxiety, GERD,  epigastric pain, depression. She presented to the hospital on 01/11/2023 for nausea/vomiting/diarrhea and abdominal pain x 2 weeks. She was admitted for AHRF in setting of CAP/COPD exacerbation. She was found to have post-obstructive PNA (RML/RLL) and right hilar mass with progressive respiratory failure requiring intubation 5/6. In addition, she experienced panic/anxiety attacks leading to exacerbated WOB and VCD with associated stridor. On 5/15 she was extubated but quickly decomensated and was reintubated. Tracheostomy placed 5/17. She tolerated 3 hours of trach collar on 5/25.  Pt has been having anxiety and thus has not tolerated vent weaning.  RN reached out to SLP to attempt PMSV in line and RN assisted with coordinating with RT, [name].      SLP Plan  Continue with current plan of care      Recommendations for follow up therapy are one component of a multi-disciplinary discharge planning process, led by the attending physician.  Recommendations may be updated based on patient status, additional functional criteria and insurance authorization.    Recommendations         PMSV Supervision: Full (with RT)           Oral care BID   Frequent or constant Supervision/Assistance Aphonia (R49.1)     Continue with current plan of care     [name]  02/14/2023, 11:17 AM

## 2023-02-14 NOTE — Progress Notes (Addendum)
Rt tried vent valve with pt. Pt remain stable for nearly 30 min. RT then placed pt on TC 50% with PMV. Pt tolerate PMV long enough to talk and tell us how she feels. Pt state she was getting SOB so RT remove PMV. Pt on 50% TC as of now. MD,RN and speech was at bedside during this process. No current complications at this time.

## 2023-02-14 NOTE — Progress Notes (Addendum)
Brief PCCM Progress Note  Tried vent valve, remained stable for nearly half hour. Actually able to tolerate PM valve with trach collar. She acknowledges that anxiety has played significant role in limiting her vent weaning. Her morale is ok however and she's willing to continue current measures, grateful for the supportive care she's been given in ICU to date. Dr. Kathrynn Running has shared treatment CT images showing a little improvement in RLL airway obstruction but some widespread ggo developing elsewhere - unsure if ggo related to treatment but does not currently appear to be dealing with active lower respiratory tract infection. She has completed 18 treatments out of 30. We'll continue current measures and oncology will let us know if there's any role for inpatient chemotherapy vs waiting to establish as outpatient.   Laroy Apple Pulmonary/Critical Care

## 2023-02-14 NOTE — Progress Notes (Signed)
Pt transported to radiation on LTV vent with no complications. Pt placed back on servo-I at this time.

## 2023-02-14 NOTE — Progress Notes (Signed)
NAME:  [name], MRN:  [id number], DOB:  [birth year], LOS: 33 ADMISSION DATE:  01/11/2023, CONSULTATION DATE:  01/16/2023 REFERRING MD: FMTS, CHIEF COMPLAINT: Acute Respiratory Failure in the setting of Lung Cancer, COPD and infiltrate on CXR   History of Present Illness:  67 yo female former smoker presented to [facility] ER with nausea, vomiting, diarrhea and abdominal pain for 2 weeks.  Found to have RML and RLL post-obstructive pneumonia with Rt hilar mass, new onset A fib.  She required intubation on 5/06 for hypoxic respiratory failure.  Transferred to [facility] on 5/07 for cancer therapy.  Pertinent Medical History:  COPD, Adenocarcinoma LLL November 2021, Bladder cancer, GERD, Anxiety with panic attacks, Depression, Allergies  Significant Hospital Events: Including procedures, antibiotic start and stop dates in addition to other pertinent events   5/6 Transferred to ICU for acute Respiratory Failure , intubated  5/7 Bronch by Icard. significant extrinsic compression of the airways in the right lower lobe and middle lobe. This is likely malignant obstruction. Bx sent. Transferred to [facility] for rad/onc eval. PICC placed 5/8 Stable still high PEEP/FiO2 needs. Rad/onc consult requested  5/9 start radiation therapy 5/10 Tolerating SBT trial this AM on low-dose fentanyl and Precedex.  Propofol DC'd due to high triglycerides 5/11 Tolerates SBT's, pressure support 10/5 5/12 Off of pressors. Remains on vent, sedated with Precedex/Fentanyl. PMT consulted. 5/13 Weaning on PSV 14/8. > 10L positive over the course of admission. Diuresing. 5/14 Weaning on PSV. Ongoing diuresis. 5/15 Tolerating SBT, PSV 5/5, following commands. Continue diuresis, Foley placed for persistent urinary retention. Extubated, decompensated quickly after requiring reintubation. DHT placed. 5/16 Agitated overnight, c/o throat pain with ETT, RASS 1-2 despite sedation. Failed extubation. Tolerating SBT PSV 10/5, FiO2 35%. ~1630 desaturated  with inability to pass [place] suction/difficulty bagging; urgent tube exchange completed with bougie and blood clot removed with improvement in oxygenation/ventilation. 5/17 Tracheostomy placed 5/19 Back in atrial fibrillation in am, HR 150s 5/20 Weaned on pressure support 10/5 5/21 Desaturated during weaning. Still on dex and fent gtt 5/23 added fent patch and seroquel had to have NE gtt and midodrine started for drug related hypotension 5/24 Trialing fent gtt off. Foley removed 5/25 3 hrs. trach collar 5/26 Pressure support minimal trach collar 5/28 SIMV/PS, increased pain. Changed oxy fent patch to dilaudid incr seroquel 5/29 radiation. Off dilaudid gtt. Lower dose precedex requirement  5/30 try to wean precedex. Repeat anxiety during/after rad. Incr anxiolytics 6/01 change to pressure control as rest mode  Interim History / Subjective:   Radiation doesn't seem to have made much difference wrt RLL/RMB obstruction on my quick read of treatment CT. Failing PS weans very quickly still.   Objective:  Blood pressure (!) 99/59, pulse (!) 118, temperature 98.1 F (36.7 C), temperature source Axillary, resp. rate (!) 24, height 5\' 8"  (1.727 m), weight 84 kg, SpO2 91 %.    Vent Mode: PCV FiO2 (%):  [40 %-50 %] 40 % Set Rate:  [24 bmp] 24 bmp PEEP:  [5 cmH20] 5 cmH20 Pressure Support:  [12 cmH20] 12 cmH20 Plateau Pressure:  [16 cmH20-26 cmH20] 24 cmH20   Intake/Output Summary (Last 24 hours) at 02/14/2023 0843 Last data filed at 02/14/2023 0837 Gross per 24 hour  Intake 3076.78 ml  Output 1000 ml  Net 2076.78 ml   Filed Weights   02/09/23 0500 02/10/23 0500 02/14/23 0500  Weight: 81.8 kg 81.1 kg 84 kg   Physical Examination: Anxious, follows commands  Rhonchi bl, equal chest rise, +secretions  in ballard Abdomen soft, NT BLE warm, trace edema  Resolved Hospital Problem List:  Hypertriglyceridemia due to propofol, Hyponatremia , Hypotension drug induced from sedation. Vocal cord  edema, Post obstructive PNA: Zosyn completed 5/13, Hypotension from hypovolemia and sedation  Assessment & Plan:   Acute hypoxic respiratory failure from post-obstructive pneumonia and atelectasis in setting of COPD with emphysema. Failure to wean s/p tracheostomy. - again will review treatment CT today and discuss with oncology, radiation oncology, likely plan for bronchoscopy/airway inspection tomorrow - PS wean to trach collar as tolerated; use pressure control as rest mode - anxiety and deconditioning limit ability to tolerate vent weaning - pulmonary hygiene, check sputum culture - goal SpO2 90 to 95% - continue brovana, pulmicort, yupelri - prn albuterol - trach care  Rt hilar mass with poorly differentiated NSCLC. Hx of LLL adenocarcinoma from November 2021. - continue radiation therapy per radiation oncology  Acute metabolic encephalopathy from sepsis and hypoxia. Hx of depression, anxiety, chronic cancer pain. - precedex for RASS goal 0 to -1 - increase celexa to 20 mg daily, buspar to 20 mg bid - continue klonopin 2 mg tid, neurontin 600 mg tid, dilaudid 1 mg q4h, robaxin 500 mg tid, remeron 15 mg qhs, seroquel 100 mg bid  Paroxysmal atrial fibrillation. HLD, HTN. - back in AF today - continue eliquis, lipitor, hold norvasc   Anemia of critical illness, iron deficiency, and chronic disease. - continue ferrous sulfate - f/u CBC   Urine retention. - foley replaced 5/25  - continue bethanechol  Deconditioning. - PT/OT  Goals of care - as above will reach out to radiation oncology, oncology today to discuss her progress here to date  Best Practice: (right click and "Reselect all SmartList Selections" daily)   Diet/type: tubefeeds DVT prophylaxis: Eliquis  GI prophylaxis: H2B Lines: Central line - PICC Foley:  Yes Code Status:  full code Last date of multidisciplinary goals of care discussion: 5/25   Labs:      Latest Ref Rng & Units 02/14/2023    4:15 AM  02/13/2023    5:00 AM 02/11/2023    9:09 AM  CMP  Glucose 70 - 99 mg/dL 952  841  324   BUN 8 - 23 mg/dL 27  24  22    Creatinine 0.44 - 1.00 mg/dL 4.01  0.27  2.53   Sodium 135 - 145 mmol/L 140  138  139   Potassium 3.5 - 5.1 mmol/L 3.8  4.1  4.4   Chloride 98 - 111 mmol/L 106  105  101   CO2 22 - 32 mmol/L 24  26  29    Calcium 8.9 - 10.3 mg/dL 8.6  8.7  8.7        Latest Ref Rng & Units 02/14/2023    4:15 AM 02/13/2023    1:28 PM 02/13/2023    5:00 AM  CBC  WBC 4.0 - 10.5 K/uL 6.6   5.3   Hemoglobin 12.0 - 15.0 g/dL 8.2  7.8  6.8   Hematocrit 36.0 - 46.0 % 25.4  24.9  22.1   Platelets 150 - 400 K/uL 342   337    ABG    Component Value Date/Time   PHART 7.550 (H) 01/17/2023 0343   PCO2ART 38.4 01/17/2023 0343   PO2ART 53 (L) 01/17/2023 0343   HCO3 29.9 (H) 02/13/2023 0916   TCO2 35 (H) 01/17/2023 0343   O2SAT 60.2 02/13/2023 0916    CBG (last 3)  Recent Labs  02/13/23 2332 02/14/23 0305 02/14/23 0731  GLUCAP 160* 141* 134*    Critical care time: 42 minutes   Laroy Apple Pulmonary/Critical Care  Pager - [phone] or [phone] 02/14/2023, 8:43 AM

## 2023-02-15 ENCOUNTER — Other Ambulatory Visit: Payer: Self-pay

## 2023-02-15 ENCOUNTER — Ambulatory Visit
Admission: RE | Admit: 2023-02-15 | Discharge: 2023-02-15 | Disposition: A | Discharge status: Home / Self Care | Payer: 59 | Source: Ambulatory Visit | Attending: Radiation Oncology | Admitting: Radiation Oncology

## 2023-02-15 ENCOUNTER — Inpatient Hospital Stay (HOSPITAL_COMMUNITY): Payer: 59

## 2023-02-15 ENCOUNTER — Other Ambulatory Visit: Payer: Self-pay | Admitting: Family Medicine

## 2023-02-15 DIAGNOSIS — J9601 Acute respiratory failure with hypoxia: Secondary | ICD-10-CM | POA: Diagnosis not present

## 2023-02-15 DIAGNOSIS — C3432 Malignant neoplasm of lower lobe, left bronchus or lung: Secondary | ICD-10-CM | POA: Diagnosis not present

## 2023-02-15 DIAGNOSIS — Z87891 Personal history of nicotine dependence: Secondary | ICD-10-CM | POA: Diagnosis not present

## 2023-02-15 DIAGNOSIS — Z510 Encounter for antineoplastic radiation therapy: Secondary | ICD-10-CM | POA: Diagnosis not present

## 2023-02-15 LAB — BASIC METABOLIC PANEL
Anion gap: 8 (ref 5–15)
BUN: 23 mg/dL (ref 8–23)
CO2: 24 mmol/L (ref 22–32)
Calcium: 8.2 mg/dL — ABNORMAL LOW (ref 8.9–10.3)
Chloride: 106 mmol/L (ref 98–111)
Creatinine, Ser: 0.69 mg/dL (ref 0.44–1.00)
GFR, Estimated: >60 mL/min (ref >60)
Glucose, Bld: 105 mg/dL — ABNORMAL HIGH (ref 70–99)
Potassium: 4.2 mmol/L (ref 3.5–5.1)
Sodium: 138 mmol/L (ref 135–145)

## 2023-02-15 LAB — GLUCOSE, CAPILLARY
Glucose-Capillary: 156 mg/dL — ABNORMAL HIGH (ref 70–99)
Glucose-Capillary: 109 mg/dL — ABNORMAL HIGH (ref 70–99)
Glucose-Capillary: 138 mg/dL — ABNORMAL HIGH (ref 70–99)
Glucose-Capillary: 141 mg/dL — ABNORMAL HIGH (ref 70–99)
Glucose-Capillary: 133 mg/dL — ABNORMAL HIGH (ref 70–99)

## 2023-02-15 LAB — CBC
HCT: 23.3 % — ABNORMAL LOW (ref 36.0–46.0)
Hemoglobin: 7.1 g/dL — ABNORMAL LOW (ref 12.0–15.0)
MCH: 27.7 pg (ref 26.0–34.0)
MCHC: 30.5 g/dL (ref 30.0–36.0)
MCV: 91 fL (ref 80.0–100.0)
Platelets: 322 10*3/uL (ref 150–400)
RBC: 2.56 MIL/uL — ABNORMAL LOW (ref 3.87–5.11)
RDW: 15 % (ref 11.5–15.5)
WBC: 7.6 10*3/uL (ref 4.0–10.5)
nRBC: 0 % (ref 0.0–0.2)

## 2023-02-15 LAB — RAD ONC ARIA SESSION SUMMARY
Course Elapsed Days: 28
Plan Fractions Treated to Date: 17
Plan Prescribed Dose Per Fraction: 2 Gy
Plan Total Fractions Prescribed: 27
Plan Total Prescribed Dose: 54 Gy
Reference Point Dosage Given to Date: 34 Gy
Reference Point Session Dosage Given: 2 Gy
Session Number: 20

## 2023-02-15 MED ORDER — BANATROL TF EN LIQD
60.0000 mL | Freq: Two times a day (BID) | ENTERAL | Status: AC
  Administered 2023-02-16 – 2023-02-20 (×9): 60 mL
  Filled 2023-02-15 (×9): qty 60

## 2023-02-15 NOTE — Evaluation (Signed)
SLP Cancellation Note  Patient Details Name: [name] MRN: [id number] DOB: 1955/10/23   Cancelled treatment:       Reason Eval/Treat Not Completed: Other (comment);Fatigue/lethargy limiting ability to participate (RN reports pt is resting on vent, sleepy at this time; will continue efforts)  Rolena Infante, MS [facility] SLP Acute Rehab Services Office [phone]   Chales Abrahams 02/15/2023, 3:09 PM

## 2023-02-15 NOTE — Progress Notes (Signed)
Nutrition Follow-up  DOCUMENTATION CODES:   Severe malnutrition in context of acute illness/injury  INTERVENTION:  - Continue goal TF: Osmolite 1.5 @ 55 ml/hr (1320 ml/day) PROSource TF20 60 ml daily - Provides 2060 kcal, 103 grams of protein, and 1006 ml of H2O.   - Restart Banatrol TF BID x5 days to aid in bulking of stools due to continue diarrhea.              - If diarrhea worsens can restart for another 5 days.   - FWF per CCM/MD.   - MVI with minerals daily per tube   NUTRITION DIAGNOSIS:   Severe Malnutrition related to acute illness as evidenced by moderate fat depletion, moderate muscle depletion. *ongoing  GOAL:   Patient will meet greater than or equal to 90% of their needs *met with TF  MONITOR:   Vent status, Labs, Weight trends, TF tolerance  REASON FOR ASSESSMENT:   Ventilator, Consult Enteral/tube feeding initiation and management  ASSESSMENT:   67 y.o. female with PMHx including adenocarcinoma of LLL now with recurrence, COPD, and anxiety presents with acute on chronic hypoxemic respiratory failure likely multifactorial  5/2 Admit 5/6 Intubated; Osm 1.5 started at 73mL/hr 5/8 Osm 1.5 increased to goal of 15mL/hr 5/15 Extubated by quickly re-intubated; TF restarted at 18mL/hr due to xray showing stomach distension 5/17 s/p trach  Patient sitting in bedside chair at time of visit. Awoke briefly to sound of voice but quickly went back to sleep. TF infusing at goal rate of 75mL/hr, continues to tolerate well.   Diarrhea has continued, Banatrol stopped 6/1. Will restart Banatrol BID for another 5 days to further aid in bulking of stools.  Weaning trials remain ongoing. Patient tolerated trach collar for ~1-2 hours yesterday.  Patient has had current NGT in since 5/17 (slightly less than 3 weeks). If unable to tolerate trach collar and advance oral diet would recommend consideration of long term feeding tube.   Admit weight: 174 # Current weight:  186# Weight likely elevated due to generalized edema and fluid status as patient +7L. Will continue to monitor for need to adjust TF.  Medications reviewed and include: 300mg  ferrous sulfate, MVI, Remeron  Labs reviewed:  -   Diet Order:   Diet Order             Diet NPO time specified  Diet effective now                   EDUCATION NEEDS:  Education needs have been addressed  Skin:  Skin Assessment: Reviewed RN Assessment  Last BM:  6/5  Height:  Ht Readings from Last 1 Encounters:  01/31/23 5\' 8"  (1.727 m)   Weight:  Wt Readings from Last 1 Encounters:  02/15/23 84.5 kg   BMI:  Body mass index is 28.33 kg/m.  Estimated Nutritional Needs:  Kcal:  1900-2100 Protein:  95-120 g Fluid:  >1.9 L    Shelle Iron RD, LDN For contact information, refer to [facility]

## 2023-02-15 NOTE — Progress Notes (Signed)
These changes were due to acute panic which lasted until 0030

## 2023-02-15 NOTE — Progress Notes (Deleted)
Cardiology Office Note:   Date:  02/15/2023  ID:  [name], DOB [birth year], MRN [id number] PCP: Fayette Pho, MD  [facility] Providers Cardiologist:  None { Click to update primary MD,subspecialty MD or APP then REFRESH:1}   History of Present Illness:   [name] is a 67 y.o. female  with the above history. She has no known prior cardiac history and has never had any cardiac work-up. She does have known stage 3 COPD and well as a history of lung cancer with suspected recurrence based on recent PET scan.  We saw her last month in the hospital.  She had was in the hospital with abdominal pain, sepsis and pneumonia.  She developed atrial fib.  ***      ***   Patient presented to the [facility] ED on 01/11/2023 for diffuse abdominal pain and nausea/ vomiting. However, she also endorsed shortness of breath. Abdominal/ pelvic CT showed wall thickening and stranding at the duodenal bulb and second portion of duodenum with inflammatory changes at the pancreatic head and uncinate process which could be secondary to pancreatitis or duodenitis. CT also showed incompletely visualized large heterogenous right hilar and infrahilar mass with heterogenous consolidations and ground glass disease in the right lower lobe potentially due to post-obstructive pneumonia as well as a large bulla in the right lower lobe containing fluid level suggesting superimposed infection. Chest x-ray showed interval development of a small right pleural effusion with overlying opacities in the right base consistent with atelectasis or pneumonia. WBC 16.8, Hgb 13.2, Plts 426. Na 137, K 3.1, Glucose 97, BUN 16, Cr 1.06. Total Bili elevated at 1.6 but otherwise LFTs normal. Lipase normal at 27. She was admitted with acute on chronic hypoxic respiratory failure and severe sepsis secondary to likely pneumonia and was started on antibiotics. Pulmonary was consulted for further evaluation of suspected lung cancer  recurrence.   Initial EKG last night arrival showed sinus tachycardia. However, repeat EKG early this morning showed atrial fibrillation with rate of 118 bpm. This is a new diagnosis for her. Therefore, Cardiology was consulted for further evaluation. At the time of this evaluation, patient is resting comfortably in no acute distress. She is currently on 6L of HFNC. She states she initially presented to the ED due to poor oral intake and nausea, vomiting, and diarrhea for the past 2 weeks as well as increased shortness of breath. She has chronic dyspnea at baseline and was recently started on 2L of O2 at home. However, she states that it has been getting worse over the last 2 weeks. She does describe having to sleep on a incline and waking up feeling short of breath in the middle of the night. However, it does not sound like this is new. She states she has had to sleep on a incline for years. I suspect this is due to her severe COPD rather than true orthopnea or PND from heart failure. No recent edema. She describes some occasional chest pain only when she takes a deep breath but no exertional shortness of breath. She has palpitations related to her anxiety but nothing outside of these times. No significant lightheadedness, dizziness, or syncope. She denies any fevers. She does describe a productive cough but no nasal congestion. No abnormal bleeding including hematemesis, hematochezia, melena, or hematuria. Of note, patient has significant anxiety. She had a mild panic attack when I started asking her question about her lung cancer but was able to quickly recover from this.  She is currently back in normal sinus rhythm with rates in the 90s. Looks like she converted back to sinus rhythm around 5ish this morning.   She does have a history of tobacco use but quit 2 years ago. She has a family history of CAD and states her father had a history of MI. No other known family history of heart disease.    ROS:  ***  Studies Reviewed:    EKG:  ***  ***  Risk Assessment/Calculations:   {Does this patient have ATRIAL FIBRILLATION?:[id number]}          Physical Exam:   VS:  There were no vitals taken for this visit.   Wt Readings from Last 3 Encounters:  02/15/23 186 lb 4.6 oz (84.5 kg)  01/09/23 174 lb 9.6 oz (79.2 kg)  01/06/23 172 lb (78 kg)     GEN: Well nourished, well developed in no acute distress NECK: No JVD; No carotid bruits CARDIAC: ***RRR, no murmurs, rubs, gallops RESPIRATORY:  Clear to auscultation without rales, wheezing or rhonchi  ABDOMEN: Soft, non-tender, non-distended EXTREMITIES:  No edema; No deformity   ASSESSMENT AND PLAN:   PAF:  *** .  She was sent home on Eliquis.  ***   No PAF.  Continue Eliquis.  OK to hold for upcoming bronchoscopy and upcoming colonoscopy.  See med change below   HTN:  ***  I will change her Norvasc to Cardizem.  BP is mildly elevated.  Sinus tachy.     ELEVATED PULMONARY PRESSURE:  ***  Likely related to underlying lung disease.       {Are you ordering a CV Procedure (e.g. stress test, cath, DCCV, TEE, etc)?   Press F2        :[id number]}   Signed, Rollene Rotunda, MD

## 2023-02-15 NOTE — TOC Progression Note (Signed)
Transition of Care [facility]) - Progression Note    Patient Details  Name: [name] MRN: [id number] Date of Birth: 08/23/1956  [facility] CM/SW Contact  Lavenia Atlas, RN Phone Number: 02/15/2023, 12:33 PM  Clinical Narrative:    Per chart review patient continues to received radiation therapy. This RNCM offered choice for potential LTAC, post radiation therapy. [name] [name] has Sports coach for [employer]     TOC will continue to follow for needs.     Expected Discharge Plan: Long Term Acute Care (LTAC) Barriers to Discharge: Continued Medical Work up  Expected Discharge Plan and Services In-house Referral: Chaplain, Hospice / Palliative Care Discharge Planning Services: CM Consult Post Acute Care Choice: Long Term Acute Care (LTAC) Living arrangements for the past 2 months: Apartment Expected Discharge Date: 01/14/23               DME Arranged: N/A DME Agency: NA       HH Arranged: NA HH Agency: NA         Social Determinants of Health (SDOH) Interventions SDOH Screenings   Food Insecurity: No Food Insecurity (01/13/2023)  Housing: Low Risk  (01/13/2023)  Transportation Needs: No Transportation Needs (01/13/2023)  Utilities: Not At Risk (01/13/2023)  Alcohol Screen: Low Risk  (12/13/2022)  Depression (PHQ2-9): Low Risk  (12/13/2022)  Financial Resource Strain: Low Risk  (12/13/2022)  Physical Activity: Inactive (12/13/2022)  Social Connections: Moderately Integrated (12/13/2022)  Stress: No Stress Concern Present (12/13/2022)  Tobacco Use: Medium Risk (01/22/2023)    Readmission Risk Interventions    02/07/2023    2:14 PM 02/01/2023   12:07 PM  Readmission Risk Prevention Plan  Transportation Screening Complete Complete  PCP or Specialist Appt within 3-5 Days  Complete  HRI or Home Care Consult  Complete  Social Work Consult for Recovery Care Planning/Counseling  Complete  Palliative Care Screening  Complete  Medication Review Furniture conservator/restorer) Complete Complete  PCP or Specialist appointment within 3-5 days of discharge Complete   HRI or Home Care Consult Complete   SW Recovery Care/Counseling Consult Complete   Palliative Care Screening Complete   Skilled Nursing Facility Complete

## 2023-02-15 NOTE — Progress Notes (Signed)
NAME:  [name], MRN:  [id number], DOB:  07/05/1956, LOS: 34 ADMISSION DATE:  01/11/2023, CONSULTATION DATE:  01/16/2023 REFERRING MD: FMTS, CHIEF COMPLAINT: Acute Respiratory Failure in the setting of Lung Cancer, COPD and infiltrate on CXR   History of Present Illness:  67 yo female former smoker presented to [facility] ER with nausea, vomiting, diarrhea and abdominal pain for 2 weeks.  Found to have RML and RLL post-obstructive pneumonia with Rt hilar mass, new onset A fib.  She required intubation on 5/06 for hypoxic respiratory failure.  Transferred to [facility] on 5/07 for cancer therapy.  Pertinent Medical History:  COPD, Adenocarcinoma LLL November 2021, Bladder cancer, GERD, Anxiety with panic attacks, Depression, Allergies  Significant Hospital Events: Including procedures, antibiotic start and stop dates in addition to other pertinent events   5/6 Transferred to ICU for acute Respiratory Failure , intubated  5/7 Bronch by Icard. significant extrinsic compression of the airways in the right lower lobe and middle lobe. This is likely malignant obstruction. Bx sent. Transferred to [facility] for rad/onc eval. PICC placed 5/8 Stable still high PEEP/FiO2 needs. Rad/onc consult requested  5/9 start radiation therapy 5/10 Tolerating SBT trial this AM on low-dose fentanyl and Precedex.  Propofol DC'd due to high triglycerides 5/11 Tolerates SBT's, pressure support 10/5 5/12 Off of pressors. Remains on vent, sedated with Precedex/Fentanyl. PMT consulted. 5/13 Weaning on PSV 14/8. > 10L positive over the course of admission. Diuresing. 5/14 Weaning on PSV. Ongoing diuresis. 5/15 Tolerating SBT, PSV 5/5, following commands. Continue diuresis, Foley placed for persistent urinary retention. Extubated, decompensated quickly after requiring reintubation. DHT placed. 5/16 Agitated overnight, c/o throat pain with ETT, RASS 1-2 despite sedation. Failed extubation. Tolerating SBT PSV 10/5, FiO2 35%. ~1630 desaturated  with inability to pass Ballard suction/difficulty bagging; urgent tube exchange completed with bougie and blood clot removed with improvement in oxygenation/ventilation. 5/17 Tracheostomy placed 5/19 Back in atrial fibrillation in am, HR 150s 5/20 Weaned on pressure support 10/5 5/21 Desaturated during weaning. Still on dex and fent gtt 5/23 added fent patch and seroquel had to have NE gtt and midodrine started for drug related hypotension 5/24 Trialing fent gtt off. Foley removed 5/25 3 hrs. trach collar 5/26 Pressure support minimal trach collar 5/28 SIMV/PS, increased pain. Changed oxy fent patch to dilaudid incr seroquel 5/29 radiation. Off dilaudid gtt. Lower dose precedex requirement  5/30 try to wean precedex. Repeat anxiety during/after rad. Incr anxiolytics 6/01 change to pressure control as rest mode 6/4 trial vent valve and able to phonate, trach collar trial for ~1-2 hours surprisingly and wore PMV for 5 minutes and able to phonate well   Interim History / Subjective:   Had trial of vent valve, PMV as above  A little bit of progress wrt RLL/RML airway obstruction - possibly developing immediate phase pulmonary edema however related to radiation treatment.  Episode of bloody plugging this morning requiring in-line suctioning  Objective:  Blood pressure (!) 97/56, pulse 98, temperature 98.8 F (37.1 C), temperature source Axillary, resp. rate (!) 30, height 5\' 8"  (1.727 m), weight 84.5 kg, SpO2 91 %.    Vent Mode: PCV FiO2 (%):  [40 %-100 %] 45 % Set Rate:  [24 bmp] 24 bmp PEEP:  [5 cmH20] 5 cmH20 Pressure Support:  [12 cmH20] 12 cmH20 Plateau Pressure:  [22 cmH20-23 cmH20] 22 cmH20   Intake/Output Summary (Last 24 hours) at 02/15/2023 0802 Last data filed at 02/15/2023 0731 Gross per 24 hour  Intake 3855.51 ml  Output 1750 ml  Net 2105.51 ml   Filed Weights   02/10/23 0500 02/14/23 0500 02/15/23 0500  Weight: 81.1 kg 84 kg 84.5 kg   Physical Examination: Follows  commands, grossly nonfocal  Mech breath sounds bl, equal chest rise, +bloody secretions in ballard Abdomen soft, NT BLE warm, trace edema  Resolved Hospital Problem List:  Hypertriglyceridemia due to propofol, Hyponatremia , Hypotension drug induced from sedation. Vocal cord edema, Post obstructive PNA: Zosyn completed 5/13, Hypotension from hypovolemia and sedation  Assessment & Plan:   Acute hypoxic respiratory failure from post-obstructive pneumonia and atelectasis in setting of COPD with emphysema. Failure to wean s/p tracheostomy. - PS wean to trach collar as tolerated; use pressure control as rest mode - anxiety, deconditioning, tumor burden limit ability to tolerate vent weaning - pulmonary hygiene, check sputum culture - goal SpO2 90 to 95% - continue brovana, pulmicort, yupelri - prn albuterol - trach care  Rt hilar mass with poorly differentiated NSCLC. Hx of LLL adenocarcinoma from November 2021. - continue radiation therapy per radiation oncology  Acute metabolic encephalopathy from sepsis and hypoxia. Hx of depression, anxiety, chronic cancer pain. - precedex for RASS goal 0 to +1 - celexa 20 mg daily, buspar 20 mg bid - continue klonopin 2 mg tid, neurontin 600 mg tid, dilaudid 1 mg q4h, robaxin 500 mg tid, remeron 15 mg qhs, seroquel 100 mg bid  Paroxysmal atrial fibrillation. HLD, HTN. - continue eliquis, lipitor, hold norvasc   Anemia of critical illness, iron deficiency, and chronic disease. - continue ferrous sulfate - f/u CBC   Urine retention. - foley replaced 5/25  - continue bethanechol  Deconditioning. - PT/OT  Goals of care - as above will reach out to radiation oncology, oncology today to discuss her progress here to date  Best Practice: (right click and "Reselect all SmartList Selections" daily)   Diet/type: tubefeeds DVT prophylaxis: Eliquis  GI prophylaxis: H2B Lines: Central line - PICC Foley:  Yes Code Status:  full code Last date  of multidisciplinary goals of care discussion: 6/4 - remains full code, wishes to continue XRT treatment  Labs:      Latest Ref Rng & Units 02/15/2023    6:26 AM 02/14/2023    4:15 AM 02/13/2023    5:00 AM  CMP  Glucose 70 - 99 mg/dL 409  811  914   BUN 8 - 23 mg/dL 23  27  24    Creatinine 0.44 - 1.00 mg/dL 7.82  9.56  2.13   Sodium 135 - 145 mmol/L 138  140  138   Potassium 3.5 - 5.1 mmol/L 4.2  3.8  4.1   Chloride 98 - 111 mmol/L 106  106  105   CO2 22 - 32 mmol/L 24  24  26    Calcium 8.9 - 10.3 mg/dL 8.2  8.6  8.7        Latest Ref Rng & Units 02/15/2023    6:26 AM 02/14/2023    4:15 AM 02/13/2023    1:28 PM  CBC  WBC 4.0 - 10.5 K/uL 7.6  6.6    Hemoglobin 12.0 - 15.0 g/dL 7.1  8.2  7.8   Hematocrit 36.0 - 46.0 % 23.3  25.4  24.9   Platelets 150 - 400 K/uL 322  342     ABG    Component Value Date/Time   PHART 7.550 (H) 01/17/2023 0343   PCO2ART 38.4 01/17/2023 0343   PO2ART 53 (L) 01/17/2023 0343   HCO3 29.9 (  H) 02/13/2023 0916   TCO2 35 (H) 01/17/2023 0343   O2SAT 60.2 02/13/2023 0916    CBG (last 3)  Recent Labs    02/14/23 1938 02/14/23 2328 02/15/23 0329  GLUCAP 125* 102* 156*    Critical care time: 38 minutes   [name] Pulmonary/Critical Care  Pager - [phone] or [phone] 02/15/2023, 8:02 AM

## 2023-02-15 NOTE — Progress Notes (Signed)
Physical Therapy Treatment Patient Details Name: [name] MRN: [id number] DOB: 02-13-1956 Today's Date: 02/15/2023   History of Present Illness 67 year old woman who presented to [facility] 5/1 for nausea/vomiting/diarrhea and abdominal pain x 2 weeks. PMHx significant for COPD, lung CA (adenocarcinoma of LLL, biopsy planned for 5/7), bladder CA (s/p TURBT), GERD, and panic/anxiety. Admitted for AHRF in setting of CAP/COPD exacerbation to FMTS at [facility].    PT Comments    General Comments: remains on VENT, attempts to mouth words but not understandable. Will nod/shake head some with "yes/no" questions.  Good eye contact and engagment. General bed mobility comments: Max Assist + 2 side to side rolling for peri care (loose dark BM) and placement od MAXI SKY PAD. General transfer comment: with NT, Rehab Tech and 2 NT Students pt was assisted OOB to recliner via MAXI MOVE SKY LIFT General Gait Details: performed Pre gait activity of static standing from recliner + 2 Mod Assist twice using walker with an upright stance time of 25 seconds first time and 65 seconds the second time. Good upright trunk control.  No c/o dizziness.  Max fatigue after. Then performed a few seated TE's B LE of LAQ's and marching.  Positioned to comfort with multiple pillows.    Recommendations for follow up therapy are one component of a multi-disciplinary discharge planning process, led by the attending physician.  Recommendations may be updated based on patient status, additional functional criteria and insurance authorization.  Follow Up Recommendations  Can patient physically be transported by private vehicle: No    Assistance Recommended at Discharge Frequent or constant Supervision/Assistance  Patient can return home with the following Assist for transportation;Help with stairs or ramp for entrance;Assistance with cooking/housework;Two people to help with walking and/or transfers;Two people to help with  bathing/dressing/bathroom   Equipment Recommendations  None recommended by PT    Recommendations for Other Services       Precautions / Restrictions Precautions Precautions: Fall Precaution Comments: watch Spo2 , HR, RR, trach,, Cor trac, loose BM's( check) Restrictions Weight Bearing Restrictions: No     Mobility  Bed Mobility Overal bed mobility: Needs Assistance Bed Mobility: Rolling Rolling: Max assist, +2 for physical assistance, +2 for safety/equipment         General bed mobility comments: Max Assist + 2 side to side rolling for peri care (loose dark BM) and placement od MAXI SKY PAD.    Transfers Overall transfer level: Needs assistance                 General transfer comment: with NT, Rehab Tech and 2 NT Students pt was assisted OOB to recliner via MAXI MOVE SKY LIFT Transfer via Lift Equipment: Maximove  Ambulation/Gait               General Gait Details: performed Pre gait activity of static standing from recliner + 2 Mod Assist twice using walker with an upright stance time of 25 seconds first time and 65 seconds the second time. Good upright trunk control.  No c/o dizziness.  Max fatigue after.   Stairs             Wheelchair Mobility    Modified Rankin (Stroke Patients Only)       Balance  Cognition Arousal/Alertness: Awake/alert Behavior During Therapy: WFL for tasks assessed/performed Overall Cognitive Status: Within Functional Limits for tasks assessed                                 General Comments: remains on VENT, attempts to mouth words but not understandable. Will nod/shake head some with "yes/no" questions.  Good eye contact and engagment.        Exercises      General Comments        Pertinent Vitals/Pain Pain Assessment Pain Assessment: Faces Faces Pain Scale: Hurts little more Pain Location: buttocks during peri care Pain  Descriptors / Indicators: Discomfort, Grimacing Pain Intervention(s): Monitored during session, Repositioned    Home Living                          Prior Function            PT Goals (current goals can now be found in the care plan section) Progress towards PT goals: Progressing toward goals    Frequency    Min 1X/week      PT Plan Current plan remains appropriate    Co-evaluation              AM-PAC PT "6 Clicks" Mobility   Outcome Measure  Help needed turning from your back to your side while in a flat bed without using bedrails?: A Lot Help needed moving from lying on your back to sitting on the side of a flat bed without using bedrails?: A Lot Help needed moving to and from a bed to a chair (including a wheelchair)?: A Lot Help needed standing up from a chair using your arms (e.g., wheelchair or bedside chair)?: A Lot Help needed to walk in hospital room?: Total Help needed climbing 3-5 steps with a railing? : Total 6 Click Score: 10    End of Session Equipment Utilized During Treatment: Gait belt Activity Tolerance: Patient limited by fatigue Patient left: in chair;with call bell/phone within reach Nurse Communication: Mobility status;Need for lift equipment PT Visit Diagnosis: Other abnormalities of gait and mobility (R26.89);Muscle weakness (generalized) (M62.81)     Time: 4098-1191 PT Time Calculation (min) (ACUTE ONLY): 45 min  Charges:  $Gait Training: 8-22 mins (pre gait) $Therapeutic Exercise: 8-22 mins $Therapeutic Activity: 8-22 mins                     Felecia Shelling  PTA Acute  Rehabilitation Services Office M-F          [phone]

## 2023-02-15 NOTE — Progress Notes (Signed)
RT transported patient to radiation on LTV vent with no complications. Once back to room patient was placed back servo-I full support.

## 2023-02-16 ENCOUNTER — Inpatient Hospital Stay (HOSPITAL_COMMUNITY): Payer: 59

## 2023-02-16 ENCOUNTER — Other Ambulatory Visit: Payer: Self-pay

## 2023-02-16 ENCOUNTER — Telehealth: Payer: Self-pay | Admitting: Family Medicine

## 2023-02-16 ENCOUNTER — Ambulatory Visit
Admission: RE | Admit: 2023-02-16 | Discharge: 2023-02-16 | Disposition: A | Discharge status: Home / Self Care | Payer: 59 | Source: Ambulatory Visit | Attending: Radiation Oncology | Admitting: Radiation Oncology

## 2023-02-16 DIAGNOSIS — Z510 Encounter for antineoplastic radiation therapy: Secondary | ICD-10-CM | POA: Diagnosis not present

## 2023-02-16 DIAGNOSIS — Z87891 Personal history of nicotine dependence: Secondary | ICD-10-CM | POA: Diagnosis not present

## 2023-02-16 DIAGNOSIS — J9601 Acute respiratory failure with hypoxia: Secondary | ICD-10-CM | POA: Diagnosis not present

## 2023-02-16 DIAGNOSIS — C3432 Malignant neoplasm of lower lobe, left bronchus or lung: Secondary | ICD-10-CM | POA: Diagnosis not present

## 2023-02-16 LAB — BASIC METABOLIC PANEL
Anion gap: 8 (ref 5–15)
BUN: 22 mg/dL (ref 8–23)
CO2: 24 mmol/L (ref 22–32)
Calcium: 8 mg/dL — ABNORMAL LOW (ref 8.9–10.3)
Chloride: 103 mmol/L (ref 98–111)
Creatinine, Ser: 0.69 mg/dL (ref 0.44–1.00)
GFR, Estimated: >60 mL/min (ref >60)
Glucose, Bld: 227 mg/dL — ABNORMAL HIGH (ref 70–99)
Potassium: 3.8 mmol/L (ref 3.5–5.1)
Sodium: 135 mmol/L (ref 135–145)

## 2023-02-16 LAB — TYPE AND SCREEN
Antibody Screen: NEGATIVE
Unit division: 0
Unit division: 0

## 2023-02-16 LAB — RAD ONC ARIA SESSION SUMMARY
Course Elapsed Days: 29
Plan Fractions Treated to Date: 18
Plan Prescribed Dose Per Fraction: 2 Gy
Plan Total Fractions Prescribed: 27
Plan Total Prescribed Dose: 54 Gy
Reference Point Dosage Given to Date: 36 Gy
Reference Point Session Dosage Given: 2 Gy
Session Number: 21

## 2023-02-16 LAB — HEMOGLOBIN AND HEMATOCRIT, BLOOD
HCT: 22.9 % — ABNORMAL LOW (ref 36.0–46.0)
Hemoglobin: 6.9 g/dL — CL (ref 12.0–15.0)

## 2023-02-16 LAB — CBC
HCT: 21.9 % — ABNORMAL LOW (ref 36.0–46.0)
Hemoglobin: 6.6 g/dL — CL (ref 12.0–15.0)
MCH: 27.2 pg (ref 26.0–34.0)
MCHC: 30.1 g/dL (ref 30.0–36.0)
MCV: 90.1 fL (ref 80.0–100.0)
Platelets: 331 10*3/uL (ref 150–400)
RBC: 2.43 MIL/uL — ABNORMAL LOW (ref 3.87–5.11)
RDW: 15.1 % (ref 11.5–15.5)
WBC: 6 10*3/uL (ref 4.0–10.5)
nRBC: 0 % (ref 0.0–0.2)

## 2023-02-16 LAB — GLUCOSE, CAPILLARY
Glucose-Capillary: 145 mg/dL — ABNORMAL HIGH (ref 70–99)
Glucose-Capillary: 149 mg/dL — ABNORMAL HIGH (ref 70–99)
Glucose-Capillary: 119 mg/dL — ABNORMAL HIGH (ref 70–99)
Glucose-Capillary: 148 mg/dL — ABNORMAL HIGH (ref 70–99)
Glucose-Capillary: 146 mg/dL — ABNORMAL HIGH (ref 70–99)

## 2023-02-16 LAB — BPAM RBC: Unit Type and Rh: 5100

## 2023-02-16 LAB — PREPARE RBC (CROSSMATCH)

## 2023-02-16 MED ORDER — SODIUM CHLORIDE 0.9% IV SOLUTION: Freq: Once | INTRAVENOUS | Status: DC

## 2023-02-16 MED ORDER — CLONAZEPAM 0.5 MG PO TBDP
2.0000 mg | ORAL_TABLET | Freq: Two times a day (BID) | ORAL | Status: DC
  Administered 2023-02-16 – 2023-02-24 (×16): 2 mg
  Filled 2023-02-16 (×16): qty 4

## 2023-02-16 MED ORDER — FENTANYL CITRATE (PF) 100 MCG/2ML IJ SOLN
100.0000 ug | INTRAMUSCULAR | Status: DC | PRN
  Administered 2023-02-16 – 2023-02-26 (×29): 100 ug via INTRAVENOUS
  Filled 2023-02-16 (×33): qty 2

## 2023-02-16 MED ORDER — ENOXAPARIN SODIUM 40 MG/0.4ML IJ SOSY
40.0000 mg | PREFILLED_SYRINGE | INTRAMUSCULAR | Status: DC
  Administered 2023-02-16: 40 mg via SUBCUTANEOUS
  Filled 2023-02-16: qty 0.4

## 2023-02-16 MED ORDER — QUETIAPINE FUMARATE 100 MG PO TABS
100.0000 mg | ORAL_TABLET | Freq: Every day | ORAL | Status: DC
  Administered 2023-02-16 – 2023-02-28 (×13): 100 mg
  Filled 2023-02-16 (×13): qty 1

## 2023-02-16 MED ORDER — SODIUM CHLORIDE 0.9% IV SOLUTION: Freq: Once | INTRAVENOUS | Status: AC

## 2023-02-16 MED ORDER — MIDAZOLAM HCL 2 MG/2ML IJ SOLN
4.0000 mg | Freq: Once | INTRAMUSCULAR | Status: DC
  Filled 2023-02-16: qty 4

## 2023-02-16 MED ORDER — ENOXAPARIN SODIUM 100 MG/ML IJ SOSY: 1.0000 mg/kg | PREFILLED_SYRINGE | Freq: Two times a day (BID) | INTRAMUSCULAR | Status: DC

## 2023-02-16 MED ORDER — MIDAZOLAM HCL 2 MG/2ML IJ SOLN
4.0000 mg | Freq: Once | INTRAMUSCULAR | Status: AC
  Administered 2023-02-16: 4 mg via INTRAVENOUS

## 2023-02-16 MED ORDER — ROCURONIUM BROMIDE 10 MG/ML (PF) SYRINGE
PREFILLED_SYRINGE | INTRAVENOUS | Status: AC
  Filled 2023-02-16: qty 10

## 2023-02-16 MED ORDER — FENTANYL CITRATE (PF) 100 MCG/2ML IJ SOLN
200.0000 ug | Freq: Once | INTRAMUSCULAR | Status: AC
  Administered 2023-02-16: 200 ug via INTRAVENOUS

## 2023-02-16 MED ORDER — ENOXAPARIN SODIUM 40 MG/0.4ML IJ SOSY: 40.0000 mg | PREFILLED_SYRINGE | INTRAMUSCULAR | Status: DC

## 2023-02-16 MED ORDER — PHENYLEPHRINE 80 MCG/ML (10ML) SYRINGE FOR IV PUSH (FOR BLOOD PRESSURE SUPPORT)
PREFILLED_SYRINGE | INTRAVENOUS | Status: AC
  Filled 2023-02-16: qty 10

## 2023-02-16 MED ORDER — APIXABAN 5 MG PO TABS: 5.0000 mg | ORAL_TABLET | Freq: Two times a day (BID) | ORAL | Status: DC

## 2023-02-16 NOTE — Progress Notes (Signed)
Bedside bronchoscopy assisted with Dr. Thora Lance. Patient remained stable during procedure.

## 2023-02-16 NOTE — Progress Notes (Signed)
eLink Physician-Brief Progress Note Patient Name: [name] DOB: [birth year] MRN: [id number]   Date of Service  02/16/2023  HPI/Events of Note  Hemoglobin 6.6 gm / dl, no evidence of active bleeding.  eICU Interventions  One unit PRBC ordered transfused.     Intervention Category Intermediate Interventions: Bleeding - evaluation and treatment with blood products  Ivanna Kocak U Chiffon Kittleson 02/16/2023, 5:14 AM

## 2023-02-16 NOTE — Progress Notes (Signed)
Pt very anxious at this time hr 135, rr 38, trach collar not attempted, RT will assess at later time

## 2023-02-16 NOTE — Progress Notes (Signed)
Contacted e-link regarding critical Hgb value: 6.6

## 2023-02-16 NOTE — Telephone Encounter (Signed)
PCP note. Called patient's daughter [name] to offer support. Daughter very up to date. Remains an uphill battle but they are holding out hope.   Fayette Pho, MD

## 2023-02-16 NOTE — Progress Notes (Signed)
Pt transported to radiology via transport vent with no complications.

## 2023-02-16 NOTE — Procedures (Signed)
Bronchoscopy Procedure Note  [name]  [id number]  [birth year]  Date:02/16/23  Time:7:43 PM   Provider Performing:Saifullah Jolley M Thora Lance   Procedure(s):  Flexible Bronchoscopy [phone]) and Initial Therapeutic Aspiration of Tracheobronchial Tree [id number])  Indication(s) Hemoptysis Mucus plugging  Consent Unable to obtain consent due to emergent nature of procedure.  Anesthesia See [facility] for details   Time Out Verified patient identification, verified procedure, site/side was marked, verified correct patient position, special equipment/implants available, medications/allergies/relevant history reviewed, required imaging and test results available.   Sterile Technique Usual hand hygiene, masks, gowns, and gloves were used   Procedure Description Bronchoscope advanced through tracheostomy tube and into airway.  Airways were examined down to subsegmental level with findings noted below.    Findings:  - secretions at end of trach suctioned - blood tinged secretions occluding LLL suctioned - RMB, BI patent, RLL bronchus with lumen about the same diameter as scope at takeoff, did not attempt to traverse    Complications/Tolerance None; patient tolerated the procedure well. Chest X-ray is not needed post procedure.   EBL Minimal   Specimen(s) None

## 2023-02-16 NOTE — Progress Notes (Addendum)
SLP Cancellation Note  Patient Details Name: [name] MRN: [id number] DOB: 11-01-1955   Cancelled treatment:       Reason Eval/Treat Not Completed: Other (comment) [name], RT, present and reports pt is having anxiety at this time with increased HR and RR and is not appropriate for inline PMSV or trach collar trials with PMSV.  Will continue efforts. Advised pt would continue efforts.)  Laura Infante, MS [facility] SLP Acute Rehab Services Office [phone]  [name] 02/16/2023, 3:40 PM

## 2023-02-16 NOTE — Progress Notes (Signed)
NAME:  [name], MRN:  [id number], DOB:  [birth year], LOS: 35 ADMISSION DATE:  01/11/2023, CONSULTATION DATE:  01/16/2023 REFERRING MD: FMTS, CHIEF COMPLAINT: Acute Respiratory Failure in the setting of Lung Cancer, COPD and infiltrate on CXR   History of Present Illness:  67 yo female former smoker presented to [facility] ER with nausea, vomiting, diarrhea and abdominal pain for 2 weeks.  Found to have RML and RLL post-obstructive pneumonia with Rt hilar mass, new onset A fib.  She required intubation on 5/06 for hypoxic respiratory failure.  Transferred to [facility] on 5/07 for cancer therapy.  Pertinent Medical History:  COPD, Adenocarcinoma LLL November 2021, Bladder cancer, GERD, Anxiety with panic attacks, Depression, Allergies  Significant Hospital Events: Including procedures, antibiotic start and stop dates in addition to other pertinent events   5/6 Transferred to ICU for acute Respiratory Failure , intubated  5/7 Bronch by Icard. significant extrinsic compression of the airways in the right lower lobe and middle lobe. This is likely malignant obstruction. Bx sent. Transferred to [facility] for rad/onc eval. PICC placed 5/8 Stable still high PEEP/FiO2 needs. Rad/onc consult requested  5/9 start radiation therapy 5/10 Tolerating SBT trial this AM on low-dose fentanyl and Precedex.  Propofol DC'd due to high triglycerides 5/11 Tolerates SBT's, pressure support 10/5 5/12 Off of pressors. Remains on vent, sedated with Precedex/Fentanyl. PMT consulted. 5/13 Weaning on PSV 14/8. > 10L positive over the course of admission. Diuresing. 5/14 Weaning on PSV. Ongoing diuresis. 5/15 Tolerating SBT, PSV 5/5, following commands. Continue diuresis, Foley placed for persistent urinary retention. Extubated, decompensated quickly after requiring reintubation. DHT placed. 5/16 Agitated overnight, c/o throat pain with ETT, RASS 1-2 despite sedation. Failed extubation. Tolerating SBT PSV 10/5, FiO2 35%. ~1630 desaturated  with inability to pass Ballard suction/difficulty bagging; urgent tube exchange completed with bougie and blood clot removed with improvement in oxygenation/ventilation. 5/17 Tracheostomy placed 5/19 Back in atrial fibrillation in am, HR 150s 5/20 Weaned on pressure support 10/5 5/21 Desaturated during weaning. Still on dex and fent gtt 5/23 added fent patch and seroquel had to have NE gtt and midodrine started for drug related hypotension 5/24 Trialing fent gtt off. Foley removed 5/25 3 hrs. trach collar 5/26 Pressure support minimal trach collar 5/28 SIMV/PS, increased pain. Changed oxy fent patch to dilaudid incr seroquel 5/29 radiation. Off dilaudid gtt. Lower dose precedex requirement  5/30 try to wean precedex. Repeat anxiety during/after rad. Incr anxiolytics 6/01 change to pressure control as rest mode 6/4 trial vent valve and able to phonate, trach collar trial for ~1-2 hours surprisingly and wore PMV for 5 minutes and able to phonate well   Interim History / Subjective:   1U pRBC this morning  Otherwise no acute issues   Objective:  Blood pressure 134/65, pulse 100, temperature (!) 97.4 F (36.3 C), temperature source Axillary, resp. rate (!) 24, height 5\' 8"  (1.727 m), weight 86.3 kg, SpO2 97 %.    Vent Mode: PCV FiO2 (%):  [45 %-60 %] 50 % Set Rate:  [24 bmp] 24 bmp PEEP:  [5 cmH20] 5 cmH20 Plateau Pressure:  [14 cmH20-22 cmH20] 19 cmH20   Intake/Output Summary (Last 24 hours) at 02/16/2023 0831 Last data filed at 02/16/2023 0657 Gross per 24 hour  Intake 3019.59 ml  Output 1350 ml  Net 1669.59 ml   Filed Weights   02/14/23 0500 02/15/23 0500 02/16/23 0500  Weight: 84 kg 84.5 kg 86.3 kg   Physical Examination: Follows commands, grossly nonfocal Mech breath  sounds bl, equal chest rise Abdomen soft, NT BLE warm, trace edema  Resolved Hospital Problem List:  Hypertriglyceridemia due to propofol, Hyponatremia , Hypotension drug induced from sedation. Vocal cord  edema, Post obstructive PNA: Zosyn completed 5/13, Hypotension from hypovolemia and sedation  Assessment & Plan:   Acute hypoxic respiratory failure from post-obstructive pneumonia and atelectasis in setting of COPD with emphysema. Failure to wean s/p tracheostomy. - PS wean to trach collar as tolerated; use pressure control as rest mode - anxiety, deconditioning, tumor burden limit ability to tolerate vent weaning - pulmonary hygiene, check sputum culture - goal SpO2 90 to 95% - continue brovana, pulmicort, yupelri - prn albuterol - trach care  Rt hilar mass with poorly differentiated NSCLC. Hx of LLL adenocarcinoma from November 2021. - continue radiation therapy per radiation oncology  Acute metabolic encephalopathy from sepsis and hypoxia. Hx of depression, anxiety, chronic cancer pain. - precedex for RASS goal 0 to +1 - celexa 20 mg daily, buspar 20 mg bid - we'll try to start chipping away at the following sedation regimen to try clarify what's absolutely essential to facilitate her weaning: decrease to klonopin 2 mg BID,continue neurontin 600 mg tid (home med), dilaudid 1 mg q4h, stop robaxin 500 mg tid, continue remeron 15 mg qhs, decrease to seroquel 100 mg QHS  Paroxysmal atrial fibrillation. HLD, HTN. - hold eliquis in advance of likely g-tube placement - lipitor, hold norvasc   Anemia of critical illness, iron deficiency, and chronic disease. - continue ferrous sulfate - f/u CBC   Urine retention. - foley replaced 5/25  - continue bethanechol  Deconditioning. - PT/OT  Goals of care - revisited on 6/4, continuing current measures including goal to complete planned course of XRT (now 19/30 treatments completed)  Best Practice: (right click and "Reselect all SmartList Selections" daily)   Diet/type: tubefeeds DVT prophylaxis: Eliquis on hold in advance of g tube placement GI prophylaxis: H2B Lines: Central line - PICC Foley:  Yes Code Status:  full  code Last date of multidisciplinary goals of care discussion: 6/4 - remains full code, wishes to continue XRT treatment  Labs:      Latest Ref Rng & Units 02/16/2023    4:30 AM 02/15/2023    6:26 AM 02/14/2023    4:15 AM  CMP  Glucose 70 - 99 mg/dL 161  096  045   BUN 8 - 23 mg/dL 22  23  27    Creatinine 0.44 - 1.00 mg/dL 4.09  8.11  9.14   Sodium 135 - 145 mmol/L 135  138  140   Potassium 3.5 - 5.1 mmol/L 3.8  4.2  3.8   Chloride 98 - 111 mmol/L 103  106  106   CO2 22 - 32 mmol/L 24  24  24    Calcium 8.9 - 10.3 mg/dL 8.0  8.2  8.6        Latest Ref Rng & Units 02/16/2023    4:30 AM 02/15/2023    6:26 AM 02/14/2023    4:15 AM  CBC  WBC 4.0 - 10.5 K/uL 6.0  7.6  6.6   Hemoglobin 12.0 - 15.0 g/dL 6.6  7.1  8.2   Hematocrit 36.0 - 46.0 % 21.9  23.3  25.4   Platelets 150 - 400 K/uL 331  322  342    ABG    Component Value Date/Time   PHART 7.550 (H) 01/17/2023 0343   PCO2ART 38.4 01/17/2023 0343   PO2ART 53 (L) 01/17/2023 0343  HCO3 29.9 (H) 02/13/2023 0916   TCO2 35 (H) 01/17/2023 0343   O2SAT 60.2 02/13/2023 0916    CBG (last 3)  Recent Labs    02/15/23 1522 02/15/23 1948 02/16/23 0427  GLUCAP 141* 133* 145*    Critical care time: 35 minutes   Laroy Apple Pulmonary/Critical Care  Pager - [phone] or [phone] 02/16/2023, 8:31 AM

## 2023-02-17 ENCOUNTER — Inpatient Hospital Stay (HOSPITAL_COMMUNITY): Payer: 59

## 2023-02-17 ENCOUNTER — Ambulatory Visit
Admission: RE | Admit: 2023-02-17 | Discharge: 2023-02-17 | Disposition: A | Discharge status: Home / Self Care | Payer: 59 | Source: Ambulatory Visit | Attending: Radiation Oncology | Admitting: Radiation Oncology

## 2023-02-17 ENCOUNTER — Other Ambulatory Visit: Payer: Self-pay

## 2023-02-17 ENCOUNTER — Ambulatory Visit: Payer: 59 | Admitting: Cardiology

## 2023-02-17 DIAGNOSIS — C3432 Malignant neoplasm of lower lobe, left bronchus or lung: Secondary | ICD-10-CM | POA: Diagnosis not present

## 2023-02-17 DIAGNOSIS — Z87891 Personal history of nicotine dependence: Secondary | ICD-10-CM | POA: Diagnosis not present

## 2023-02-17 DIAGNOSIS — Z510 Encounter for antineoplastic radiation therapy: Secondary | ICD-10-CM | POA: Diagnosis not present

## 2023-02-17 DIAGNOSIS — J439 Emphysema, unspecified: Secondary | ICD-10-CM

## 2023-02-17 DIAGNOSIS — I10 Essential (primary) hypertension: Secondary | ICD-10-CM

## 2023-02-17 DIAGNOSIS — J9601 Acute respiratory failure with hypoxia: Secondary | ICD-10-CM | POA: Diagnosis not present

## 2023-02-17 DIAGNOSIS — I480 Paroxysmal atrial fibrillation: Secondary | ICD-10-CM

## 2023-02-17 LAB — HEMOGLOBIN AND HEMATOCRIT, BLOOD
HCT: 26.4 % — ABNORMAL LOW (ref 36.0–46.0)
Hemoglobin: 7.8 g/dL — ABNORMAL LOW (ref 12.0–15.0)

## 2023-02-17 LAB — TYPE AND SCREEN: Unit division: 0

## 2023-02-17 LAB — BPAM RBC
Blood Product Expiration Date: 202407032359
Blood Product Expiration Date: 202407082359
Blood Product Expiration Date: 202407092359
ISSUE DATE / TIME: 202406061207
ISSUE DATE / TIME: 202406070439
Unit Type and Rh: 5100
Unit Type and Rh: 5100

## 2023-02-17 LAB — CBC
HCT: 22.7 % — ABNORMAL LOW (ref 36.0–46.0)
Hemoglobin: 6.9 g/dL — CL (ref 12.0–15.0)
MCH: 27.4 pg (ref 26.0–34.0)
MCHC: 30.4 g/dL (ref 30.0–36.0)
MCV: 90.1 fL (ref 80.0–100.0)
Platelets: 299 10*3/uL (ref 150–400)
RBC: 2.52 MIL/uL — ABNORMAL LOW (ref 3.87–5.11)
RDW: 15.8 % — ABNORMAL HIGH (ref 11.5–15.5)
WBC: 6.1 10*3/uL (ref 4.0–10.5)
nRBC: 0 % (ref 0.0–0.2)

## 2023-02-17 LAB — BASIC METABOLIC PANEL
Anion gap: 10 (ref 5–15)
BUN: 20 mg/dL (ref 8–23)
CO2: 24 mmol/L (ref 22–32)
Calcium: 7.8 mg/dL — ABNORMAL LOW (ref 8.9–10.3)
Chloride: 101 mmol/L (ref 98–111)
Creatinine, Ser: 0.61 mg/dL (ref 0.44–1.00)
GFR, Estimated: >60 mL/min (ref >60)
Glucose, Bld: 175 mg/dL — ABNORMAL HIGH (ref 70–99)
Potassium: 4.3 mmol/L (ref 3.5–5.1)
Sodium: 135 mmol/L (ref 135–145)

## 2023-02-17 LAB — RAD ONC ARIA SESSION SUMMARY
Course Elapsed Days: 30
Plan Fractions Treated to Date: 19
Plan Prescribed Dose Per Fraction: 2 Gy
Plan Total Fractions Prescribed: 27
Plan Total Prescribed Dose: 54 Gy
Reference Point Dosage Given to Date: 38 Gy
Reference Point Session Dosage Given: 2 Gy
Session Number: 22

## 2023-02-17 LAB — GLUCOSE, CAPILLARY
Glucose-Capillary: 191 mg/dL — ABNORMAL HIGH (ref 70–99)
Glucose-Capillary: 113 mg/dL — ABNORMAL HIGH (ref 70–99)
Glucose-Capillary: 122 mg/dL — ABNORMAL HIGH (ref 70–99)
Glucose-Capillary: 126 mg/dL — ABNORMAL HIGH (ref 70–99)
Glucose-Capillary: 203 mg/dL — ABNORMAL HIGH (ref 70–99)
Glucose-Capillary: 238 mg/dL — ABNORMAL HIGH (ref 70–99)

## 2023-02-17 LAB — PREPARE RBC (CROSSMATCH)

## 2023-02-17 MED ORDER — SODIUM CHLORIDE 0.9% IV SOLUTION: Freq: Once | INTRAVENOUS | Status: DC

## 2023-02-17 MED ORDER — DEXAMETHASONE SODIUM PHOSPHATE 10 MG/ML IJ SOLN
6.0000 mg | INTRAMUSCULAR | Status: AC
  Administered 2023-02-17 – 2023-02-21 (×5): 6 mg via INTRAVENOUS
  Filled 2023-02-17 (×5): qty 1

## 2023-02-17 NOTE — Progress Notes (Signed)
RT NOTE:  Pt transported to and from radiation via transport ventilator. BMV at bedside at all times. No complications noted.

## 2023-02-17 NOTE — Progress Notes (Signed)
NAME:  [name], MRN:  [id number], DOB:  30-Sep-1955, LOS: 36 ADMISSION DATE:  01/11/2023, CONSULTATION DATE:  01/16/2023 REFERRING MD: FMTS, CHIEF COMPLAINT: Acute Respiratory Failure in the setting of Lung Cancer, COPD and infiltrate on CXR   History of Present Illness:  67 yo female former smoker presented to [facility] ER with nausea, vomiting, diarrhea and abdominal pain for 2 weeks.  Found to have RML and RLL post-obstructive pneumonia with Rt hilar mass, new onset A fib.  She required intubation on 5/06 for hypoxic respiratory failure.  Transferred to [facility] on 5/07 for cancer therapy.  Pertinent Medical History:  COPD, Adenocarcinoma LLL November 2021, Bladder cancer, GERD, Anxiety with panic attacks, Depression, Allergies  Significant Hospital Events: Including procedures, antibiotic start and stop dates in addition to other pertinent events   5/6 Transferred to ICU for acute Respiratory Failure , intubated  5/7 Bronch by Icard. significant extrinsic compression of the airways in the right lower lobe and middle lobe. This is likely malignant obstruction. Bx sent. Transferred to [facility] for rad/onc eval. PICC placed 5/8 Stable still high PEEP/FiO2 needs. Rad/onc consult requested  5/9 start radiation therapy 5/10 Tolerating SBT trial this AM on low-dose fentanyl and Precedex.  Propofol DC'd due to high triglycerides 5/11 Tolerates SBT's, pressure support 10/5 5/12 Off of pressors. Remains on vent, sedated with Precedex/Fentanyl. PMT consulted. 5/13 Weaning on PSV 14/8. > 10L positive over the course of admission. Diuresing. 5/14 Weaning on PSV. Ongoing diuresis. 5/15 Tolerating SBT, PSV 5/5, following commands. Continue diuresis, Foley placed for persistent urinary retention. Extubated, decompensated quickly after requiring reintubation. DHT placed. 5/16 Agitated overnight, c/o throat pain with ETT, RASS 1-2 despite sedation. Failed extubation. Tolerating SBT PSV 10/5, FiO2 35%. ~1630 desaturated  with inability to pass Ballard suction/difficulty bagging; urgent tube exchange completed with bougie and blood clot removed with improvement in oxygenation/ventilation. 5/17 Tracheostomy placed 5/19 Back in atrial fibrillation in am, HR 150s 5/20 Weaned on pressure support 10/5 5/21 Desaturated during weaning. Still on dex and fent gtt 5/23 added fent patch and seroquel had to have NE gtt and midodrine started for drug related hypotension 5/24 Trialing fent gtt off. Foley removed 5/25 3 hrs. trach collar 5/26 Pressure support minimal trach collar 5/28 SIMV/PS, increased pain. Changed oxy fent patch to dilaudid incr seroquel 5/29 radiation. Off dilaudid gtt. Lower dose precedex requirement  5/30 try to wean precedex. Repeat anxiety during/after rad. Incr anxiolytics 6/01 change to pressure control as rest mode 6/4 trial vent valve and able to phonate, trach collar trial for ~1-2 hours surprisingly and wore PMV for 5 minutes and able to phonate well   Interim History / Subjective:   Has required 3U pRBC over last 24h as Hb has drifted  Some bloody output from ETT   She felt like she was choking yesterday with secretions and was tachypneic requiring suctioning. Bronch yesterday with concern for mucus plugging but secretion burden actually wasn't that bad  Objective:  Blood pressure (!) 147/85, pulse (!) 116, temperature 98.2 F (36.8 C), temperature source Axillary, resp. rate (!) 39, height 5\' 8"  (1.727 m), weight 86.6 kg, SpO2 (!) 89 %.    Vent Mode: PCV FiO2 (%):  [50 %-70 %] 70 % Set Rate:  [24 bmp] 24 bmp PEEP:  [5 cmH20] 5 cmH20 Plateau Pressure:  [19 cmH20-27 cmH20] 27 cmH20   Intake/Output Summary (Last 24 hours) at 02/17/2023 0812 Last data filed at 02/17/2023 0745 Gross per 24 hour  Intake 4421.87  ml  Output 1995 ml  Net 2426.87 ml   Filed Weights   02/15/23 0500 02/16/23 0500 02/17/23 0427  Weight: 84.5 kg 86.3 kg 86.6 kg   Physical Examination: Drowsy but arousable,  grossly nonfocal Wheeze, rhonchi bl, equal chest rise Abdomen soft, NT BLE warm, trace edema  Resolved Hospital Problem List:  Hypertriglyceridemia due to propofol, Hyponatremia , Hypotension drug induced from sedation. Vocal cord edema, Post obstructive PNA: Zosyn completed 5/13, Hypotension from hypovolemia and sedation  Assessment & Plan:   Acute hypoxic respiratory failure from post-obstructive pneumonia and atelectasis in setting of COPD with emphysema. Failure to wean s/p tracheostomy. Bronchospasm - check CXR - PS wean/trach collar as tolerated; use pressure control as rest mode - anxiety, deconditioning, tumor burden limit ability to tolerate vent weaning - start dexamethasone 6 mg IV daily - pulmonary hygiene, check sputum culture - goal SpO2 90 to 95% - continue brovana, pulmicort, yupelri - prn albuterol - trach care  Rt hilar mass with poorly differentiated NSCLC. Hx of LLL adenocarcinoma from November 2021. - continue radiation therapy per radiation oncology  Acute metabolic encephalopathy from sepsis and hypoxia. Hx of depression, anxiety, chronic cancer pain. - precedex for RASS goal 0 to +1 - celexa 20 mg daily, buspar 20 mg bid - continue klonopin 2 mg BID,continue neurontin 600 mg tid (home med), dilaudid 1 mg q4h, continue remeron 15 mg qhs, continue seroquel 100 mg QHS  Paroxysmal atrial fibrillation. HLD, HTN. - hold eliquis in advance of likely g-tube placement, Hb drifting as well  - lipitor, hold norvasc   Anemia of critical illness, iron deficiency, and chronic disease, ICU phlebotomy - continue ferrous sulfate - f/u CBC - hold therapeutic/ppx AC as she has required 3U pRBC in last 24h   Urine retention. - foley replaced 5/25  - continue bethanechol  Deconditioning. - PT/OT  Goals of care - revisited on 6/4, continuing current measures including goal to complete planned course of XRT (now 19/30 treatments completed)  Best Practice: (right  click and "Reselect all SmartList Selections" daily)   Diet/type: tubefeeds DVT prophylaxis: Eliquis on hold in advance of g tube placement, DVT chemoppx on hold with transfusion requirement in last 24h. SCDs. GI prophylaxis: H2B Lines: Central line - PICC Foley:  Yes Code Status:  full code Last date of multidisciplinary goals of care discussion: 6/4 - remains full code, wishes to continue XRT treatment  Labs:      Latest Ref Rng & Units 02/17/2023    4:34 AM 02/16/2023    4:30 AM 02/15/2023    6:26 AM  CMP  Glucose 70 - 99 mg/dL 956  213  086   BUN 8 - 23 mg/dL 20  22  23    Creatinine 0.44 - 1.00 mg/dL 5.78  4.69  6.29   Sodium 135 - 145 mmol/L 135  135  138   Potassium 3.5 - 5.1 mmol/L 4.3  3.8  4.2   Chloride 98 - 111 mmol/L 101  103  106   CO2 22 - 32 mmol/L 24  24  24    Calcium 8.9 - 10.3 mg/dL 7.8  8.0  8.2        Latest Ref Rng & Units 02/17/2023    4:34 AM 02/16/2023    3:01 PM 02/16/2023    4:30 AM  CBC  WBC 4.0 - 10.5 K/uL 6.1   6.0   Hemoglobin 12.0 - 15.0 g/dL 6.9  6.9  6.6   Hematocrit 36.0 -  46.0 % 22.7  22.9  21.9   Platelets 150 - 400 K/uL 299   331    ABG    Component Value Date/Time   PHART 7.550 (H) 01/17/2023 0343   PCO2ART 38.4 01/17/2023 0343   PO2ART 53 (L) 01/17/2023 0343   HCO3 29.9 (H) 02/13/2023 0916   TCO2 35 (H) 01/17/2023 0343   O2SAT 60.2 02/13/2023 0916    CBG (last 3)  Recent Labs    02/16/23 1822 02/16/23 2327 02/17/23 0511  GLUCAP 146* 191* 113*    Critical care time: 38 minutes   Laroy Apple Pulmonary/Critical Care  Pager - [phone] or [phone] 02/17/2023, 8:12 AM

## 2023-02-17 NOTE — Progress Notes (Signed)
eLink Physician-Brief Progress Note Patient Name: [name] DOB: 1956/08/17 MRN: [id number]   Date of Service  02/17/2023  HPI/Events of Note  67 yo with Rt hilar mass from NSCLC, post-ob PNA, COPD, VDRF s/p trach, Anxiety/depression.  Intermittent transfusion dependent anemia with ongoing bloody pulmonary secretions, Hg 6.9  eICU Interventions  1 unit PRBC ordered     Intervention Category Intermediate Interventions: Bleeding - evaluation and treatment with blood products  Virgen Belland 02/17/2023, 5:35 AM

## 2023-02-17 NOTE — Progress Notes (Signed)
SLP Cancellation Note  Patient Details Name: [name] MRN: [id number] DOB: 01/19/1956   Cancelled treatment:       Reason Eval/Treat Not Completed: Other (comment) (per RN pt is not appropriate for PMSV today due to requiring bronch and airway resistance on vent) Rolena Infante, MS [facility] SLP Acute Rehab Services Office [phone]   Chales Abrahams 02/17/2023, 8:18 AM

## 2023-02-18 DIAGNOSIS — J9601 Acute respiratory failure with hypoxia: Secondary | ICD-10-CM | POA: Diagnosis not present

## 2023-02-18 LAB — TYPE AND SCREEN
ABO/RH(D): O POS
Antibody Screen: NEGATIVE
Unit division: 0

## 2023-02-18 LAB — CBC
HCT: 29.1 % — ABNORMAL LOW (ref 36.0–46.0)
Hemoglobin: 9 g/dL — ABNORMAL LOW (ref 12.0–15.0)
MCH: 27.5 pg (ref 26.0–34.0)
MCHC: 30.9 g/dL (ref 30.0–36.0)
MCV: 89 fL (ref 80.0–100.0)
Platelets: 436 10*3/uL — ABNORMAL HIGH (ref 150–400)
RBC: 3.27 MIL/uL — ABNORMAL LOW (ref 3.87–5.11)
RDW: 15.1 % (ref 11.5–15.5)
WBC: 9.9 10*3/uL (ref 4.0–10.5)
nRBC: 0 % (ref 0.0–0.2)

## 2023-02-18 LAB — GLUCOSE, CAPILLARY
Glucose-Capillary: 144 mg/dL — ABNORMAL HIGH (ref 70–99)
Glucose-Capillary: 152 mg/dL — ABNORMAL HIGH (ref 70–99)
Glucose-Capillary: 155 mg/dL — ABNORMAL HIGH (ref 70–99)
Glucose-Capillary: 162 mg/dL — ABNORMAL HIGH (ref 70–99)
Glucose-Capillary: 169 mg/dL — ABNORMAL HIGH (ref 70–99)
Glucose-Capillary: 180 mg/dL — ABNORMAL HIGH (ref 70–99)
Glucose-Capillary: 181 mg/dL — ABNORMAL HIGH (ref 70–99)

## 2023-02-18 LAB — BPAM RBC
Blood Product Expiration Date: 202407092359
ISSUE DATE / TIME: 202406070439
Unit Type and Rh: 5100

## 2023-02-18 LAB — BASIC METABOLIC PANEL
Anion gap: 11 (ref 5–15)
BUN: 26 mg/dL — ABNORMAL HIGH (ref 8–23)
CO2: 26 mmol/L (ref 22–32)
Calcium: 8.8 mg/dL — ABNORMAL LOW (ref 8.9–10.3)
Chloride: 100 mmol/L (ref 98–111)
Creatinine, Ser: 0.74 mg/dL (ref 0.44–1.00)
GFR, Estimated: >60 mL/min (ref >60)
Glucose, Bld: 149 mg/dL — ABNORMAL HIGH (ref 70–99)
Potassium: 4.6 mmol/L (ref 3.5–5.1)
Sodium: 137 mmol/L (ref 135–145)

## 2023-02-18 MED ORDER — HYDROXYZINE HCL 10 MG/5ML PO SYRP
50.0000 mg | ORAL_SOLUTION | Freq: Three times a day (TID) | ORAL | Status: DC | PRN
  Administered 2023-02-18: 50 mg
  Filled 2023-02-18 (×3): qty 25

## 2023-02-18 MED ORDER — ENOXAPARIN SODIUM 40 MG/0.4ML IJ SOSY
40.0000 mg | PREFILLED_SYRINGE | INTRAMUSCULAR | Status: DC
  Administered 2023-02-18 – 2023-02-26 (×8): 40 mg via SUBCUTANEOUS
  Filled 2023-02-18 (×8): qty 0.4

## 2023-02-18 MED ORDER — ZINC OXIDE 40 % EX OINT
TOPICAL_OINTMENT | Freq: Two times a day (BID) | CUTANEOUS | Status: DC
  Administered 2023-02-18 – 2023-02-27 (×10): 1 via TOPICAL
  Filled 2023-02-18 (×3): qty 57

## 2023-02-18 NOTE — Progress Notes (Addendum)
eLink Physician-Brief Progress Note Patient Name: [name] DOB: 03-15-1956 MRN: [id number]   Date of Service  02/18/2023  HPI/Events of Note  Incontinent stools, Flexi-Seal, skin is raw and painful  eICU Interventions  Desitin cream twice daily Wound care consultation   0550 -  itching on her chest and abdomen, could be 2/2 recent radiation.  Could consider as needed Atarax.  Intervention Category Minor Interventions: Routine modifications to care plan (e.g. PRN medications for pain, fever)  Keaghan Bowens 02/18/2023, 3:08 AM

## 2023-02-18 NOTE — Progress Notes (Signed)
NAME:  [name], MRN:  [id number], DOB:  Oct 23, 1955, LOS: 37 ADMISSION DATE:  01/11/2023, CONSULTATION DATE:  01/16/2023 REFERRING MD: FMTS, CHIEF COMPLAINT: Acute Respiratory Failure in the setting of Lung Cancer, COPD and infiltrate on CXR   History of Present Illness:  67 yo female former smoker presented to [facility] ER with nausea, vomiting, diarrhea and abdominal pain for 2 weeks.  Found to have RML and RLL post-obstructive pneumonia with Rt hilar mass, new onset A fib.  She required intubation on 5/06 for hypoxic respiratory failure.  Transferred to [facility] on 5/07 for cancer therapy.  Pertinent Medical History:  COPD, Adenocarcinoma LLL November 2021, Bladder cancer, GERD, Anxiety with panic attacks, Depression, Allergies  Significant Hospital Events: Including procedures, antibiotic start and stop dates in addition to other pertinent events   5/6 Transferred to ICU for acute Respiratory Failure , intubated  5/7 Bronch by Icard. significant extrinsic compression of the airways in the right lower lobe and middle lobe. This is likely malignant obstruction. Bx sent. Transferred to [facility] for rad/onc eval. PICC placed 5/8 Stable still high PEEP/FiO2 needs. Rad/onc consult requested  5/9 start radiation therapy 5/10 Tolerating SBT trial this AM on low-dose fentanyl and Precedex.  Propofol DC'd due to high triglycerides 5/11 Tolerates SBT's, pressure support 10/5 5/12 Off of pressors. Remains on vent, sedated with Precedex/Fentanyl. PMT consulted. 5/13 Weaning on PSV 14/8. > 10L positive over the course of admission. Diuresing. 5/14 Weaning on PSV. Ongoing diuresis. 5/15 Tolerating SBT, PSV 5/5, following commands. Continue diuresis, Foley placed for persistent urinary retention. Extubated, decompensated quickly after requiring reintubation. DHT placed. 5/16 Agitated overnight, c/o throat pain with ETT, RASS 1-2 despite sedation. Failed extubation. Tolerating SBT PSV 10/5, FiO2 35%. ~1630 desaturated  with inability to pass Ballard suction/difficulty bagging; urgent tube exchange completed with bougie and blood clot removed with improvement in oxygenation/ventilation. 5/17 Tracheostomy placed 5/19 Back in atrial fibrillation in am, HR 150s 5/20 Weaned on pressure support 10/5 5/21 Desaturated during weaning. Still on dex and fent gtt 5/23 added fent patch and seroquel had to have NE gtt and midodrine started for drug related hypotension 5/24 Trialing fent gtt off. Foley removed 5/25 3 hrs. trach collar 5/26 Pressure support minimal trach collar 5/28 SIMV/PS, increased pain. Changed oxy fent patch to dilaudid incr seroquel 5/29 radiation. Off dilaudid gtt. Lower dose precedex requirement  5/30 try to wean precedex. Repeat anxiety during/after rad. Incr anxiolytics 6/01 change to pressure control as rest mode 6/4 trial vent valve and able to phonate, trach collar trial for ~1-2 hours surprisingly and wore PMV for 5 minutes and able to phonate well  6/6 intermittent plugging, underwent bronch but required surprisingly little therapeutic aspiration   Interim History / Subjective:   Itching overnight on chest - given atarax  Really eager to get out of bed to chair  Objective:  Blood pressure 117/65, pulse 86, temperature (!) 97.3 F (36.3 C), temperature source Axillary, resp. rate (!) 24, height 5\' 8"  (1.727 m), weight 86.6 kg, SpO2 96 %.    Vent Mode: PCV FiO2 (%):  [70 %] 70 % Set Rate:  [24 bmp] 24 bmp PEEP:  [5 cmH20] 5 cmH20 Plateau Pressure:  [20 cmH20-25 cmH20] 20 cmH20   Intake/Output Summary (Last 24 hours) at 02/18/2023 0810 Last data filed at 02/18/2023 0730 Gross per 24 hour  Intake 2831.96 ml  Output 1230 ml  Net 1601.96 ml   Filed Weights   02/15/23 0500 02/16/23 0500 02/17/23 0427  Weight: 84.5 kg 86.3 kg 86.6 kg   Physical Examination: Drowsy but arousable, grossly nonfocal Less wheeze/rhonchi bl, equal chest rise Abdomen soft, NT BLE warm, trace LE  edema  Resolved Hospital Problem List:  Hypertriglyceridemia due to propofol, Hyponatremia , Hypotension drug induced from sedation. Vocal cord edema, Post obstructive PNA: Zosyn completed 5/13, Hypotension from hypovolemia and sedation  Assessment & Plan:   Acute hypoxic respiratory failure from post-obstructive pneumonia and atelectasis in setting of COPD with emphysema. Failure to wean s/p tracheostomy. Bronchospasm - PS wean/trach collar as tolerated; use pressure control as rest mode - anxiety, deconditioning, tumor burden limit ability to tolerate vent weaning - continue dexamethasone 6 mg IV daily - would probably continue through the rest of her radiation course unless we're running into issues with agitation/insomnia - pulmonary hygiene - goal SpO2 90 to 95% - continue brovana, pulmicort, yupelri - prn albuterol - trach care  Rt hilar mass with poorly differentiated NSCLC. Hx of LLL adenocarcinoma from November 2021. - continue radiation therapy per radiation oncology  Acute metabolic encephalopathy from sepsis and hypoxia. Hx of depression, anxiety, chronic cancer pain. - precedex for RASS goal 0 to +1 - celexa 20 mg daily, buspar 20 mg bid - continue klonopin 2 mg BID,continue neurontin 600 mg tid (home med), dilaudid 1 mg q4h, continue remeron 15 mg qhs, continue seroquel 100 mg QHS  Paroxysmal atrial fibrillation. HLD, HTN. - hold eliquis in advance of likely g-tube placement, Hb drifting as well  - lipitor, hold norvasc   Anemia of critical illness, iron deficiency, and chronic disease, ICU phlebotomy - continue ferrous sulfate - f/u CBC - hold therapeutic AC as she has required 3U pRBC over preceding 24h and will likely pursue g-tube on Monday   Urine retention. - foley replaced 5/25  - continue bethanechol  Deconditioning. - PT/OT  Goals of care - revisited on 6/4, continuing current measures including goal to complete planned course of XRT (now 20/30  treatments completed)  Best Practice: (right click and "Reselect all SmartList Selections" daily)   Diet/type: tubefeeds DVT prophylaxis: Eliquis on hold in advance of g tube placement. Trial DVT chemoppx.  GI prophylaxis: H2B Lines: Central line - PICC Foley:  Yes Code Status:  full code Last date of multidisciplinary goals of care discussion: 6/4 - remains full code, wishes to continue XRT treatment  Labs:      Latest Ref Rng & Units 02/18/2023    4:52 AM 02/17/2023    4:34 AM 02/16/2023    4:30 AM  CMP  Glucose 70 - 99 mg/dL 161  096  045   BUN 8 - 23 mg/dL 26  20  22    Creatinine 0.44 - 1.00 mg/dL 4.09  8.11  9.14   Sodium 135 - 145 mmol/L 137  135  135   Potassium 3.5 - 5.1 mmol/L 4.6  4.3  3.8   Chloride 98 - 111 mmol/L 100  101  103   CO2 22 - 32 mmol/L 26  24  24    Calcium 8.9 - 10.3 mg/dL 8.8  7.8  8.0        Latest Ref Rng & Units 02/18/2023    4:52 AM 02/17/2023    9:41 AM 02/17/2023    4:34 AM  CBC  WBC 4.0 - 10.5 K/uL 9.9   6.1   Hemoglobin 12.0 - 15.0 g/dL 9.0  7.8  6.9   Hematocrit 36.0 - 46.0 % 29.1  26.4  22.7  Platelets 150 - 400 K/uL 436   299    ABG    Component Value Date/Time   PHART 7.550 (H) 01/17/2023 0343   PCO2ART 38.4 01/17/2023 0343   PO2ART 53 (L) 01/17/2023 0343   HCO3 29.9 (H) 02/13/2023 0916   TCO2 35 (H) 01/17/2023 0343   O2SAT 60.2 02/13/2023 0916    CBG (last 3)  Recent Labs    02/17/23 1943 02/18/23 0008 02/18/23 0435  GLUCAP 238* 181* 144*    Critical care time: 35 minutes   Laroy Apple Pulmonary/Critical Care  Pager - [phone] or [phone] 02/18/2023, 8:10 AM

## 2023-02-18 NOTE — Progress Notes (Signed)
OT Cancellation Note  Patient Details Name: [name] MRN: [id number] DOB: August 14, 1956   Cancelled Treatment:    Reason Eval/Treat Not Completed: Other (comment) Patient with increased FiO2 needs at this time 70% which is out of range for therapy according to therapeutic guidelines. OT to continue to follow and check back as schedule will allow for medical appropriateness.  Rosalio Loud, MS Acute Rehabilitation Department Office# [phone]  02/18/2023, 7:36 AM

## 2023-02-18 NOTE — Progress Notes (Signed)
SLP Cancellation Note  Patient Details Name: [name] MRN: [id number] DOB: 05-20-1956   Cancelled treatment:       Reason Eval/Treat Not Completed: Other (comment) (SLP contacted by RN for potential use of vent valve today; SLP tried to see pt for vent valve prior 2 days but pt was not appropriate per RNs. Unfortunately triaging patients does not allow for vent valve this weekend. Will plan to see pt for vent use on Monday 02/20/2023.)  Rolena Infante, MS [facility] SLP Acute Rehab Services Office [phone]  Chales Abrahams 02/18/2023, 4:01 PM

## 2023-02-19 ENCOUNTER — Inpatient Hospital Stay (HOSPITAL_COMMUNITY): Payer: 59

## 2023-02-19 DIAGNOSIS — J9601 Acute respiratory failure with hypoxia: Secondary | ICD-10-CM | POA: Diagnosis not present

## 2023-02-19 LAB — CBC
HCT: 30.2 % — ABNORMAL LOW (ref 36.0–46.0)
Hemoglobin: 9.2 g/dL — ABNORMAL LOW (ref 12.0–15.0)
MCH: 27.1 pg (ref 26.0–34.0)
MCHC: 30.5 g/dL (ref 30.0–36.0)
MCV: 89.1 fL (ref 80.0–100.0)
Platelets: 441 10*3/uL — ABNORMAL HIGH (ref 150–400)
RBC: 3.39 MIL/uL — ABNORMAL LOW (ref 3.87–5.11)
RDW: 15.3 % (ref 11.5–15.5)
WBC: 12.5 10*3/uL — ABNORMAL HIGH (ref 4.0–10.5)
nRBC: 0 % (ref 0.0–0.2)

## 2023-02-19 LAB — BASIC METABOLIC PANEL
Anion gap: 7 (ref 5–15)
BUN: 28 mg/dL — ABNORMAL HIGH (ref 8–23)
CO2: 29 mmol/L (ref 22–32)
Calcium: 9.1 mg/dL (ref 8.9–10.3)
Chloride: 102 mmol/L (ref 98–111)
Creatinine, Ser: 0.6 mg/dL (ref 0.44–1.00)
GFR, Estimated: >60 mL/min (ref >60)
Glucose, Bld: 152 mg/dL — ABNORMAL HIGH (ref 70–99)
Potassium: 4.5 mmol/L (ref 3.5–5.1)
Sodium: 138 mmol/L (ref 135–145)

## 2023-02-19 LAB — GLUCOSE, CAPILLARY
Glucose-Capillary: 155 mg/dL — ABNORMAL HIGH (ref 70–99)
Glucose-Capillary: 107 mg/dL — ABNORMAL HIGH (ref 70–99)
Glucose-Capillary: 110 mg/dL — ABNORMAL HIGH (ref 70–99)
Glucose-Capillary: 144 mg/dL — ABNORMAL HIGH (ref 70–99)
Glucose-Capillary: 131 mg/dL — ABNORMAL HIGH (ref 70–99)

## 2023-02-19 MED ORDER — FUROSEMIDE 10 MG/ML IJ SOLN
40.0000 mg | Freq: Once | INTRAMUSCULAR | Status: AC
  Administered 2023-02-19: 40 mg via INTRAVENOUS
  Filled 2023-02-19: qty 4

## 2023-02-19 MED ORDER — HYDROXYZINE HCL 25 MG PO TABS
50.0000 mg | ORAL_TABLET | Freq: Three times a day (TID) | ORAL | Status: DC | PRN
  Administered 2023-02-19 – 2023-02-26 (×7): 50 mg
  Filled 2023-02-19 (×8): qty 2

## 2023-02-19 NOTE — Progress Notes (Signed)
NAME:  [name], MRN:  [id number], DOB:  [birth year], LOS: 38 ADMISSION DATE:  01/11/2023, CONSULTATION DATE:  01/16/2023 REFERRING MD: FMTS, CHIEF COMPLAINT: Acute Respiratory Failure in the setting of Lung Cancer, COPD and infiltrate on CXR   History of Present Illness:  67 yo female former smoker presented to [facility] ER with nausea, vomiting, diarrhea and abdominal pain for 2 weeks.  Found to have RML and RLL post-obstructive pneumonia with Rt hilar mass, new onset A fib.  She required intubation on 5/06 for hypoxic respiratory failure.  Transferred to [facility] on 5/07 for cancer therapy.  Pertinent Medical History:  COPD, Adenocarcinoma LLL November 2021, Bladder cancer, GERD, Anxiety with panic attacks, Depression, Allergies  Significant Hospital Events: Including procedures, antibiotic start and stop dates in addition to other pertinent events   5/6 Transferred to ICU for acute Respiratory Failure , intubated  5/7 Bronch by Icard. significant extrinsic compression of the airways in the right lower lobe and middle lobe. This is likely malignant obstruction. Bx sent. Transferred to [facility] for rad/onc eval. PICC placed 5/8 Stable still high PEEP/FiO2 needs. Rad/onc consult requested  5/9 start radiation therapy 5/10 Tolerating SBT trial this AM on low-dose fentanyl and Precedex.  Propofol DC'd due to high triglycerides 5/11 Tolerates SBT's, pressure support 10/5 5/12 Off of pressors. Remains on vent, sedated with Precedex/Fentanyl. PMT consulted. 5/13 Weaning on PSV 14/8. > 10L positive over the course of admission. Diuresing. 5/14 Weaning on PSV. Ongoing diuresis. 5/15 Tolerating SBT, PSV 5/5, following commands. Continue diuresis, Foley placed for persistent urinary retention. Extubated, decompensated quickly after requiring reintubation. DHT placed. 5/16 Agitated overnight, c/o throat pain with ETT, RASS 1-2 despite sedation. Failed extubation. Tolerating SBT PSV 10/5, FiO2 35%. ~1630 desaturated  with inability to pass [place] suction/difficulty bagging; urgent tube exchange completed with bougie and blood clot removed with improvement in oxygenation/ventilation. 5/17 Tracheostomy placed 5/19 Back in atrial fibrillation in am, HR 150s 5/20 Weaned on pressure support 10/5 5/21 Desaturated during weaning. Still on dex and fent gtt 5/23 added fent patch and seroquel had to have NE gtt and midodrine started for drug related hypotension 5/24 Trialing fent gtt off. Foley removed 5/25 3 hrs. trach collar 5/26 Pressure support minimal trach collar 5/28 SIMV/PS, increased pain. Changed oxy fent patch to dilaudid incr seroquel 5/29 radiation. Off dilaudid gtt. Lower dose precedex requirement  5/30 try to wean precedex. Repeat anxiety during/after rad. Incr anxiolytics 6/01 change to pressure control as rest mode 6/4 trial vent valve and able to phonate, trach collar trial for ~1-2 hours surprisingly and wore PMV for 5 minutes and able to phonate well  6/6 intermittent plugging, underwent bronch but required surprisingly little therapeutic aspiration  6/6-6/7 required 3U pRBC transfusion total without clear source of blood loss. Therapeutic AC has been held in advance of g-tube placement on 6/10, remains on ppx lovenox.  Interim History / Subjective:   No acute issues  Objective:  Blood pressure 122/72, pulse 80, temperature 98.3 F (36.8 C), temperature source Axillary, resp. rate (!) 24, height 5\' 8"  (1.727 m), weight 89.5 kg, SpO2 100 %.    Vent Mode: PCV FiO2 (%):  [60 %-70 %] 60 % Set Rate:  [24 bmp] 24 bmp PEEP:  [5 cmH20] 5 cmH20 Plateau Pressure:  [22 cmH20-24 cmH20] 22 cmH20   Intake/Output Summary (Last 24 hours) at 02/19/2023 0835 Last data filed at 02/19/2023 1324 Gross per 24 hour  Intake 2814.72 ml  Output 2000 ml  Net  814.72 ml   Filed Weights   02/16/23 0500 02/17/23 0427 02/19/23 0355  Weight: 86.3 kg 86.6 kg 89.5 kg   Physical Examination: Drowsy but arousable,  grossly nonfocal Less wheeze/rhonchi bl, equal chest rise Abdomen soft/NT, nondistended Ext warm, trace BLE edema  Resolved Hospital Problem List:  Hypertriglyceridemia due to propofol, Hyponatremia , Hypotension drug induced from sedation. Vocal cord edema, Post obstructive PNA: Zosyn completed 5/13, Hypotension from hypovolemia and sedation  Assessment & Plan:   Acute hypoxic respiratory failure from post-obstructive pneumonia and atelectasis in setting of COPD with emphysema. Failure to wean s/p tracheostomy. Bronchospasm - PS wean/trach collar as tolerated; use pressure control as rest mode. Trial vent valve tomorrow if she fails to tolerate trach collar/PMV today - anxiety, deconditioning, tumor burden limit ability to tolerate vent weaning - continue dexamethasone 6 mg IV daily - would probably continue through the rest of her radiation course unless we're running into issues with agitation/insomnia - diurese today - pulmonary hygiene - goal SpO2 90 to 95% - continue brovana, pulmicort, yupelri - prn albuterol - routine trach care  Rt hilar mass with poorly differentiated NSCLC. Hx of LLL adenocarcinoma from November 2021. - continue radiation therapy per radiation oncology  Acute metabolic encephalopathy from sepsis and hypoxia. Hx of depression, anxiety, chronic cancer pain. - precedex for RASS goal 0 to +1 - celexa 20 mg daily, buspar 20 mg bid - continue klonopin 2 mg BID,continue neurontin 600 mg tid (home med), dilaudid 1 mg q4h, continue remeron 15 mg qhs, continue seroquel 100 mg QHS  Paroxysmal atrial fibrillation. HLD, HTN. - hold eliquis in advance of likely g-tube placement - lipitor, hold norvasc   Anemia of critical illness, iron deficiency, and chronic disease, ICU phlebotomy - continue ferrous sulfate - f/u CBC - hold therapeutic AC as she has required 3U pRBC over course of 6/6-6/7 and will likely pursue g-tube on Monday   Urine retention. - foley  replaced 5/25  - continue bethanechol  Deconditioning. - PT/OT  Goals of care - revisited on 6/4, continuing current measures including goal to complete planned course of XRT (now 20/30 treatments completed)  Best Practice: (right click and "Reselect all SmartList Selections" daily)   Diet/type: tubefeeds DVT prophylaxis: Eliquis on hold in advance of g tube placement. Continue DVT chemoppx.  GI prophylaxis: H2B Lines: Central line - PICC Foley:  Yes Code Status:  full code Last date of multidisciplinary goals of care discussion: 6/4 - remains full code, wishes to continue XRT treatment  Labs:      Latest Ref Rng & Units 02/19/2023    3:58 AM 02/18/2023    4:52 AM 02/17/2023    4:34 AM  CMP  Glucose 70 - 99 mg/dL 161  096  045   BUN 8 - 23 mg/dL 28  26  20    Creatinine 0.44 - 1.00 mg/dL 4.09  8.11  9.14   Sodium 135 - 145 mmol/L 138  137  135   Potassium 3.5 - 5.1 mmol/L 4.5  4.6  4.3   Chloride 98 - 111 mmol/L 102  100  101   CO2 22 - 32 mmol/L 29  26  24    Calcium 8.9 - 10.3 mg/dL 9.1  8.8  7.8        Latest Ref Rng & Units 02/19/2023    3:58 AM 02/18/2023    4:52 AM 02/17/2023    9:41 AM  CBC  WBC 4.0 - 10.5 K/uL 12.5  9.9  Hemoglobin 12.0 - 15.0 g/dL 9.2  9.0  7.8   Hematocrit 36.0 - 46.0 % 30.2  29.1  26.4   Platelets 150 - 400 K/uL 441  436     ABG    Component Value Date/Time   PHART 7.550 (H) 01/17/2023 0343   PCO2ART 38.4 01/17/2023 0343   PO2ART 53 (L) 01/17/2023 0343   HCO3 29.9 (H) 02/13/2023 0916   TCO2 35 (H) 01/17/2023 0343   O2SAT 60.2 02/13/2023 0916    CBG (last 3)  Recent Labs    02/18/23 2327 02/19/23 0341 02/19/23 0745  GLUCAP 155* 155* 107*    Critical care time: 34 minutes   Laroy Apple Pulmonary/Critical Care  Pager - [phone] or [phone] 02/19/2023, 8:35 AM

## 2023-02-19 NOTE — Progress Notes (Signed)
   02/19/23 0100  Vent Select  Invasive or Noninvasive Invasive  Adult Vent Y  Adult Ventilator Settings  Vent Type Servo i  Humidity HME  Vent Mode PCV  Set Rate 24 bmp  FiO2 (%) 60 %  I Time 0.8 Sec(s)  I:E Ratio Set 1:2.1  Pressure Control 22 cmH20 (27-Total)  PEEP 5 cmH20  Adult Ventilator Measurements  Peak Airway Pressure 27 L/min  Mean Airway Pressure 12 cmH20  Plateau Pressure 22 cmH20  Resp Rate Spontaneous 2 br/min  Resp Rate Total 26 br/min  Exhaled Vt 526 mL  Measured Ve 131.5 L  I:E Ratio Measured 1:2.1  SpO2 99 %  Adult Ventilator Alarms  Alarms On Y  Ve High Alarm 20 L/min  Ve Low Alarm 4 L/min  Resp Rate High Alarm 50 br/min  Resp Rate Low Alarm 12  PEEP Low Alarm 4 cmH2O  Press High Alarm 50 cmH2O  Press Low Alarm 4 cmH2O  T Apnea 20 sec(s)  VAP Prevention  HOB> 30 Degrees Y  Equipment wiped down Yes  Breath Sounds  Bilateral Breath Sounds Diminished  R Upper  Breath Sounds Diminished  L Upper Breath Sounds Diminished  Vent Respiratory Assessment  Level of Consciousness Responds to Voice  Respiratory Pattern Regular;Unlabored;Symmetrical  Airway Suctioning/Secretions  Suction Type Tracheal  Suction Device  Catheter  Secretion Amount Other (Comment) (Scant)  Secretion Color Tan  Secretion Consistency Thick  Suction Tolerance Tolerated well  Suctioning Adverse Effects None     02/19/23 0100  Vent Select  Invasive or Noninvasive Invasive  Adult Vent Y  Adult Ventilator Settings  Vent Type Servo i  Humidity HME  Vent Mode PCV  Set Rate 24 bmp  FiO2 (%) 60 %  I Time 0.8 Sec(s)  I:E Ratio Set 1:2.1  Pressure Control 22 cmH20 (27-Total)  PEEP 5 cmH20  Adult Ventilator Measurements  Peak Airway Pressure 27 L/min  Mean Airway Pressure 12 cmH20  Plateau Pressure 22 cmH20  Resp Rate Spontaneous 2 br/min  Resp Rate Total 26 br/min  Exhaled Vt 526 mL  Measured Ve 131.5 L  I:E Ratio Measured 1:2.1  SpO2 99 %  Adult Ventilator Alarms   Alarms On Y  Ve High Alarm 20 L/min  Ve Low Alarm 4 L/min  Resp Rate High Alarm 50 br/min  Resp Rate Low Alarm 12  PEEP Low Alarm 4 cmH2O  Press High Alarm 50 cmH2O  Press Low Alarm 4 cmH2O  T Apnea 20 sec(s)  VAP Prevention  HOB> 30 Degrees Y  Equipment wiped down Yes  Breath Sounds  Bilateral Breath Sounds Diminished  R Upper  Breath Sounds Diminished  L Upper Breath Sounds Diminished  Vent Respiratory Assessment  Level of Consciousness Responds to Voice  Respiratory Pattern Regular;Unlabored;Symmetrical  Airway Suctioning/Secretions  Suction Type Tracheal  Suction Device  Catheter  Secretion Amount Other (Comment) (Scant)  Secretion Color Tan  Secretion Consistency Thick  Suction Tolerance Tolerated well  Suctioning Adverse Effects None

## 2023-02-19 NOTE — Progress Notes (Signed)
   02/19/23 2042  Vent Select  Invasive or Noninvasive Invasive  Adult Vent Y  Tracheostomy Shiley Flexible 6 mm Cuffed  Placement Date/Time: 01/27/23 1400   Inserted prior to hospital arrival?: Other (Comment)  Placed By: ICU physician  Brand: Shiley Flexible  Size (mm): 6 mm  Style: Cuffed  Status Secured with trach ties  Site Assessment Dry;Clean  Ties Assessment Clean, Dry (Intact)  Cuff Pressure (cm H2O) Green OR 18-26 CmH2O  Tracheostomy Equipment at bedside Yes and checklist posted at head of bed  Adult Ventilator Settings  Vent Type Servo i  Humidity HME  Vent Mode PCV  Set Rate 24 bmp  FiO2 (%) 50 %  I Time 0.8 Sec(s)  I:E Ratio Set 1:2.1  Pressure Control 22 cmH20 (27-Total)  PEEP 5 cmH20  Adult Ventilator Measurements  Peak Airway Pressure 27 L/min  Mean Airway Pressure 11 cmH20  Plateau Pressure 18 cmH20  Resp Rate Spontaneous 0 br/min  Resp Rate Total 24 br/min  Exhaled Vt 507 mL  Measured Ve 11 L  I:E Ratio Measured 1:2.1  Auto PEEP 0 cmH20  Total PEEP 5 cmH20  Adult Ventilator Alarms  Alarms On Y  Ve High Alarm 24 L/min  Ve Low Alarm 4 L/min  Resp Rate High Alarm 46 br/min  Resp Rate Low Alarm 12  PEEP Low Alarm 4 cmH2O  Press High Alarm 50 cmH2O  Press Low Alarm 4 cmH2O  T Apnea 20 sec(s)  VAP Prevention  Cuff pressure (initial) 24 cm H2O (Via Cufflator)  HOB> 30 Degrees Y  Breath Sounds  Bilateral Breath Sounds Diminished;Clear  R Upper  Breath Sounds Diminished;Clear  L Upper Breath Sounds Clear;Diminished  Vent Respiratory Assessment  Level of Consciousness Responds to Voice  Respiratory Pattern Regular;Unlabored;Symmetrical  Suction Method  Respiratory Interventions Airway suction  Airway Suctioning/Secretions  Suction Type Tracheal  Suction Device  Catheter  Secretion Amount Small  Secretion Color Tan;Pink tinged  Secretion Consistency Frothy  Suction Tolerance Tolerated well  Suctioning Adverse Effects None

## 2023-02-19 NOTE — Progress Notes (Signed)
   02/19/23 0500  Vent Select  Invasive or Noninvasive Invasive  Adult Vent Y  Tracheostomy Shiley Flexible 6 mm Cuffed  Placement Date/Time: 01/27/23 1400   Inserted prior to hospital arrival?: Other (Comment)  Placed By: ICU physician  Brand: Shiley Flexible  Size (mm): 6 mm  Style: Cuffed  Status Secured with trach ties  Site Assessment Clean;Dry  Ties Assessment Clean, Dry  Cuff Pressure (cm H2O) MOV (Manual Technique)  Tracheostomy Equipment at bedside Yes and checklist posted at head of bed  Adult Ventilator Settings  Vent Type Servo i  Humidity HME  Vent Mode PCV  Set Rate 24 bmp  FiO2 (%) 60 %  I Time 0.8 Sec(s)  I:E Ratio Set 1:2.1  Pressure Control 22 cmH20 (27-Total)  PEEP 5 cmH20  Adult Ventilator Measurements  Peak Airway Pressure 26 L/min  Mean Airway Pressure 11 cmH20  Plateau Pressure 22 cmH20  Resp Rate Spontaneous 1 br/min  Resp Rate Total 25 br/min  Exhaled Vt 523 mL  Measured Ve 15 L  I:E Ratio Measured 1:2.1  Auto PEEP 0 cmH20  Total PEEP 5 cmH20  Adult Ventilator Alarms  Alarms On Y  Ve High Alarm 20 L/min  Ve Low Alarm 4 L/min  Resp Rate High Alarm 50 br/min  Resp Rate Low Alarm 12  PEEP Low Alarm 4 cmH2O  Press High Alarm 50 cmH2O  Press Low Alarm 4 cmH2O  T Apnea 20 sec(s)  VAP Prevention  HOB> 30 Degrees Y  Daily Weaning Assessment  Daily Assessment of Readiness to Wean Wean protocol criteria met (SBT performed) (After 0700 on 02/19/2023)  Breath Sounds  Bilateral Breath Sounds Diminished  R Upper  Breath Sounds Diminished  L Upper Breath Sounds Diminished  Vent Respiratory Assessment  Level of Consciousness Responds to Voice  Respiratory Pattern Regular;Unlabored;Symmetrical  Airway Suctioning/Secretions  Suction Type Tracheal  Suction Device  Catheter  Secretion Amount Small  Secretion Color Tan  Secretion Consistency Frothy  Suction Tolerance Tolerated well  Suctioning Adverse Effects None

## 2023-02-19 NOTE — Progress Notes (Signed)
02/18/23 2030  Vent Select  Invasive or Noninvasive Invasive  Adult Vent Y  Tracheostomy Shiley Flexible 6 mm Cuffed  Placement Date/Time: 01/27/23 1400   Inserted prior to hospital arrival?: Other (Comment)  Placed By: ICU physician  Brand: Shiley Flexible  Size (mm): 6 mm  Style: Cuffed  Status Secured with trach ties  Site Assessment Clean;Dry (Secure)  Ties Assessment Clean, Dry  Cuff Pressure (cm H2O) MOV (Manual Technique)  Tracheostomy Equipment at bedside Yes and checklist posted at head of bed  Adult Ventilator Settings  Vent Type Servo i  Humidity HME  Vent Mode PCV  Set Rate 24 bmp  FiO2 (%) (S)  60 % (RT found @ this setting-sats 99%)  I Time 0.8 Sec(s)  I:E Ratio Set 1:2.1  Pressure Control 22 cmH20 (+5=27 Total)  PEEP 5 cmH20  Adult Ventilator Measurements  Peak Airway Pressure 27 L/min  Mean Airway Pressure 11 cmH20  Plateau Pressure 23 cmH20  Resp Rate Spontaneous 1 br/min  Resp Rate Total 25 br/min  Exhaled Vt 551 mL  Measured Ve 13 L  I:E Ratio Measured 1:2  Auto PEEP 0 cmH20  Total PEEP 5 cmH20  Adult Ventilator Alarms  Alarms On Y  Ve High Alarm 20 L/min  Ve Low Alarm 4 L/min  Resp Rate High Alarm 50 br/min  Resp Rate Low Alarm 12  PEEP Low Alarm 4 cmH2O  Press High Alarm 50 cmH2O  Press Low Alarm 4 cmH2O  T Apnea 20 sec(s)  VAP Prevention  HOB> 30 Degrees Y  Breath Sounds  Bilateral Breath Sounds Diminished;Coarse crackles  R Upper  Breath Sounds Coarse crackles  L Upper Breath Sounds Coarse crackles  R Lower Breath Sounds Diminished  L Lower Breath Sounds Diminished  Vent Respiratory Assessment  Patient Effort Good  Level of Consciousness Alert  Respiratory Pattern Regular;Unlabored;Symmetrical  Airway Suctioning/Secretions  Suction Type Tracheal  Suction Device  Catheter  Secretion Amount Small  Secretion Color Tan  Suction Tolerance Tolerated well  Suctioning Adverse Effects None     02/18/23 2030  Vent Select  Invasive or  Noninvasive Invasive  Adult Vent Y  Tracheostomy Shiley Flexible 6 mm Cuffed  Placement Date/Time: 01/27/23 1400   Inserted prior to hospital arrival?: Other (Comment)  Placed By: ICU physician  Brand: Shiley Flexible  Size (mm): 6 mm  Style: Cuffed  Status Secured with trach ties  Site Assessment Clean;Dry (Secure)  Ties Assessment Clean, Dry  Cuff Pressure (cm H2O) MOV (Manual Technique)  Tracheostomy Equipment at bedside Yes and checklist posted at head of bed  Adult Ventilator Settings  Vent Type Servo i  Humidity HME  Vent Mode PCV  Set Rate 24 bmp  FiO2 (%) (S)  60 % (RT found @ this setting-sats 99%)  I Time 0.8 Sec(s)  I:E Ratio Set 1:2.1  Pressure Control 22 cmH20 (+5=27 Total)  PEEP 5 cmH20  Adult Ventilator Measurements  Peak Airway Pressure 27 L/min  Mean Airway Pressure 11 cmH20  Plateau Pressure 23 cmH20  Resp Rate Spontaneous 1 br/min  Resp Rate Total 25 br/min  Exhaled Vt 551 mL  Measured Ve 13 L  I:E Ratio Measured 1:2  Auto PEEP 0 cmH20  Total PEEP 5 cmH20  Adult Ventilator Alarms  Alarms On Y  Ve High Alarm 20 L/min  Ve Low Alarm 4 L/min  Resp Rate High Alarm 50 br/min  Resp Rate Low Alarm 12  PEEP Low Alarm 4 cmH2O  Press High Alarm  50 cmH2O  Press Low Alarm 4 cmH2O  T Apnea 20 sec(s)  VAP Prevention  HOB> 30 Degrees Y  Breath Sounds  Bilateral Breath Sounds Diminished;Coarse crackles  R Upper  Breath Sounds Coarse crackles  L Upper Breath Sounds Coarse crackles  R Lower Breath Sounds Diminished  L Lower Breath Sounds Diminished  Vent Respiratory Assessment  Patient Effort Good  Level of Consciousness Alert  Respiratory Pattern Regular;Unlabored;Symmetrical  Airway Suctioning/Secretions  Suction Type Tracheal  Suction Device  Catheter  Secretion Amount Small  Secretion Color Tan  Suction Tolerance Tolerated well  Suctioning Adverse Effects None

## 2023-02-19 NOTE — Progress Notes (Signed)
Brief Interventional Radiology Note:  Request received for gastrostomy tube placement.  After review, Dr. Lowella Dandy recommends repeat CT abdomen without contrast to evaluate for ascites or new malignancy.  CT abdomen ordered and IR to evaluate for gastrostomy tube once resulted.   Alex Gardener, AGNP-BC 02/19/2023, 1:24 PM

## 2023-02-20 ENCOUNTER — Ambulatory Visit
Admission: RE | Admit: 2023-02-20 | Discharge: 2023-02-20 | Disposition: A | Discharge status: Home / Self Care | Payer: 59 | Source: Ambulatory Visit | Attending: Radiation Oncology | Admitting: Radiation Oncology

## 2023-02-20 ENCOUNTER — Other Ambulatory Visit: Payer: Self-pay

## 2023-02-20 ENCOUNTER — Inpatient Hospital Stay (HOSPITAL_COMMUNITY): Payer: 59

## 2023-02-20 DIAGNOSIS — Z930 Tracheostomy status: Secondary | ICD-10-CM | POA: Diagnosis not present

## 2023-02-20 DIAGNOSIS — C3431 Malignant neoplasm of lower lobe, right bronchus or lung: Secondary | ICD-10-CM | POA: Diagnosis not present

## 2023-02-20 DIAGNOSIS — J988 Other specified respiratory disorders: Secondary | ICD-10-CM | POA: Diagnosis not present

## 2023-02-20 DIAGNOSIS — Z9911 Dependence on respirator [ventilator] status: Secondary | ICD-10-CM | POA: Diagnosis not present

## 2023-02-20 DIAGNOSIS — C3432 Malignant neoplasm of lower lobe, left bronchus or lung: Secondary | ICD-10-CM | POA: Diagnosis not present

## 2023-02-20 DIAGNOSIS — Z87891 Personal history of nicotine dependence: Secondary | ICD-10-CM | POA: Diagnosis not present

## 2023-02-20 DIAGNOSIS — C3491 Malignant neoplasm of unspecified part of right bronchus or lung: Secondary | ICD-10-CM

## 2023-02-20 DIAGNOSIS — Z510 Encounter for antineoplastic radiation therapy: Secondary | ICD-10-CM | POA: Diagnosis not present

## 2023-02-20 DIAGNOSIS — J9601 Acute respiratory failure with hypoxia: Secondary | ICD-10-CM | POA: Diagnosis not present

## 2023-02-20 DIAGNOSIS — R918 Other nonspecific abnormal finding of lung field: Secondary | ICD-10-CM | POA: Diagnosis not present

## 2023-02-20 HISTORY — PX: IR NASO G TUBE PLC W/FL W/RAD: IMG2321

## 2023-02-20 HISTORY — PX: IR FLUORO RM 30-60 MIN: IMG2384

## 2023-02-20 LAB — BASIC METABOLIC PANEL
Anion gap: 10 (ref 5–15)
BUN: 30 mg/dL — ABNORMAL HIGH (ref 8–23)
CO2: 28 mmol/L (ref 22–32)
Calcium: 8.7 mg/dL — ABNORMAL LOW (ref 8.9–10.3)
Chloride: 99 mmol/L (ref 98–111)
Creatinine, Ser: 0.68 mg/dL (ref 0.44–1.00)
GFR, Estimated: >60 mL/min (ref >60)
Glucose, Bld: 138 mg/dL — ABNORMAL HIGH (ref 70–99)
Potassium: 4 mmol/L (ref 3.5–5.1)
Sodium: 137 mmol/L (ref 135–145)

## 2023-02-20 LAB — GLUCOSE, CAPILLARY
Glucose-Capillary: 128 mg/dL — ABNORMAL HIGH (ref 70–99)
Glucose-Capillary: 145 mg/dL — ABNORMAL HIGH (ref 70–99)
Glucose-Capillary: 83 mg/dL (ref 70–99)
Glucose-Capillary: 98 mg/dL (ref 70–99)

## 2023-02-20 LAB — RAD ONC ARIA SESSION SUMMARY
Course Elapsed Days: 33
Plan Fractions Treated to Date: 20
Plan Prescribed Dose Per Fraction: 2 Gy
Plan Total Fractions Prescribed: 27
Plan Total Prescribed Dose: 54 Gy
Reference Point Dosage Given to Date: 40 Gy
Reference Point Session Dosage Given: 2 Gy
Session Number: 23

## 2023-02-20 LAB — CBC
HCT: 25.6 % — ABNORMAL LOW (ref 36.0–46.0)
Hemoglobin: 8 g/dL — ABNORMAL LOW (ref 12.0–15.0)
MCH: 27.5 pg (ref 26.0–34.0)
MCHC: 31.3 g/dL (ref 30.0–36.0)
MCV: 88 fL (ref 80.0–100.0)
Platelets: 440 10*3/uL — ABNORMAL HIGH (ref 150–400)
RBC: 2.91 MIL/uL — ABNORMAL LOW (ref 3.87–5.11)
RDW: 15.2 % (ref 11.5–15.5)
WBC: 9.8 10*3/uL (ref 4.0–10.5)
nRBC: 0 % (ref 0.0–0.2)

## 2023-02-20 LAB — PROTIME-INR
INR: 1.3 — ABNORMAL HIGH (ref 0.8–1.2)
Prothrombin Time: 15.9 seconds — ABNORMAL HIGH (ref 11.4–15.2)

## 2023-02-20 MED ORDER — CEFAZOLIN SODIUM-DEXTROSE 2-4 GM/100ML-% IV SOLN
2.0000 g | INTRAVENOUS | Status: AC
  Administered 2023-02-20: 2 g via INTRAVENOUS
  Filled 2023-02-20: qty 100

## 2023-02-20 MED ORDER — FENTANYL CITRATE (PF) 100 MCG/2ML IJ SOLN
INTRAMUSCULAR | Status: AC | PRN
  Administered 2023-02-20: 50 ug via INTRAVENOUS

## 2023-02-20 MED ORDER — LIDOCAINE HCL 1 % IJ SOLN
INTRAMUSCULAR | Status: AC
  Filled 2023-02-20: qty 20

## 2023-02-20 MED ORDER — LIDOCAINE VISCOUS HCL 2 % MT SOLN
15.0000 mL | Freq: Once | OROMUCOSAL | Status: AC
  Administered 2023-02-20: 2 mL via OROMUCOSAL

## 2023-02-20 MED ORDER — IOHEXOL 300 MG/ML  SOLN: 50.0000 mL | Freq: Once | INTRAMUSCULAR | Status: DC | PRN

## 2023-02-20 MED ORDER — LIDOCAINE VISCOUS HCL 2 % MT SOLN
OROMUCOSAL | Status: AC
  Filled 2023-02-20: qty 15

## 2023-02-20 MED ORDER — FENTANYL CITRATE (PF) 100 MCG/2ML IJ SOLN
INTRAMUSCULAR | Status: AC
  Filled 2023-02-20: qty 2

## 2023-02-20 MED ORDER — CEFAZOLIN SODIUM-DEXTROSE 1-4 GM/50ML-% IV SOLN
1.0000 g | Freq: Once | INTRAVENOUS | Status: DC
  Filled 2023-02-20: qty 50

## 2023-02-20 MED ORDER — MIDAZOLAM HCL 2 MG/2ML IJ SOLN
INTRAMUSCULAR | Status: AC
  Filled 2023-02-20: qty 2

## 2023-02-20 MED ORDER — MIDAZOLAM HCL 2 MG/2ML IJ SOLN
INTRAMUSCULAR | Status: AC | PRN
  Administered 2023-02-20: 1 mg via INTRAVENOUS

## 2023-02-20 NOTE — Consult Note (Signed)
Chief Complaint: Patient was seen in consultation today for percutaneous gastrostomy tube placement Chief Complaint  Patient presents with   Abdominal Pain    Referring Physician(s): Sood,V  Supervising Physician: Irish Lack  Patient Status: [facility] - In-pt  History of Present Illness: [name] is a 67 y.o. female with PMH sig for anxiety/depression, bladder cancer 2022, lung cancer , COPD, DDD, HTN,HLD, anemia, GERD, fibroid who was admitted to [facility] on 5/1 with N/V, diarrhea, abd pain. Found to have pneumonia/resp failure new onset afib. Transferred to [facility] for cancer treatment. Subsequently intubated for resp failure, trach placed 5/17. NG tube in place. On precedex. She is afebrile, WBC nl, hgb 8, plts 440k, creat 0.68, PT/INR pend. Request now received for percutaneous gastrostomy tube placement. CT A/P done yesterday revealed:  1. Anatomy is suitable for percutaneous gastrostomy tube placement. 2. Progressive metastatic lung cancer with new subcutaneous and left upper abdominal/retroperitoneal soft tissue nodules. 3. Improved pneumonia/aspiration with some residual consolidative airspace disease in the left lower lobe when compared to prior imaging from 01/16/2023. 4. Persistent bilateral pleural effusions. 5. Moderate pericardial effusion. 6. The intracardiac blood pool is hypodense relative to the adjacent myocardium consistent with anemia. 7. Right hilar mass and right lower lobe pulmonary nodule consistent with known lung cancer. 8. Severe bullous pulmonary emphysema on the right. 9. Aortic Atherosclerosis (ICD10-I70.0) and Emphysema   Past Medical History:  Diagnosis Date   Abnormal CT lung screening 07/10/2020   Anxiety    Anxiety state 02/28/2012   GAD7 : 6  PHQ9 : 6   Arthritis    Bladder cancer (HCC) 09/2020   urologist--- dr Berneice Heinrich, incidental finding on PET scan 12/ 2021,  s/p TURBT 09-18-2020 high grade papillary urothelial    Cancer related pain  03/11/2022   CAP (community acquired pneumonia) 04/20/2022   Centrilobular emphysema (HCC)    Chronic left shoulder pain 04/29/2021   Close exposure to COVID-19 virus 05/03/2019   Constipation 04/09/2019   Costochondritis, acute 08/14/2020   DDD (degenerative disc disease), lumbar    Depression    DOE (dyspnea on exertion)    10-29-2020  per pt can do house work without sob, when walks/ stairs stops frequently  (stated recovers quickly when stops)   Emphysema lung (HCC)    Epidermoid cyst 10/29/2018   Recurrent. Located on left hip at iliac crest 2" anterior to midaxillary line. Removed 2013, 2020, 2022.    Epigastric pain 11/16/2020   Fibroid 2003   GERD (gastroesophageal reflux disease)    History of radiation therapy    Left lung- 10/06/20-10/16/20- Dr. Antony Blackbird   Leg cramping 11/13/2019   Lumbar spine pain 01/09/2015   Menopausal vaginal dryness 11/13/2019   Multiple closed fractures of ribs of left side 08/20/2022   Nocturnal enuresis 06/23/2020   Chronic overnight "leaking" of unknown source. Likely urine, but reports no color or odor. Volume enough for liners or light pads. Occurs every night.    Primary adenocarcinoma of lower lobe of left lung (HCC) 07/2020   oncologist--- dr Arbutus Ped---  dx 11/ 2021 bilateral lower lobe masses,  s/p bronchoscopy w/ bx's 07-28-2020 malignancy cells on left ;  completed SBRT bilateral lower lung 10-16-2020 5 fractions   Radiation-induced dermatitis    on 10-29-2020 pt complaint stomach and chest areas,  completed SBRT 10-16-2020   Seasonal allergies    Stage 3 severe COPD by GOLD classification [facility])    pulmonologist--- dr Tonia Brooms,  using anoro inhaler daily  Wears glasses     Past Surgical History:  Procedure Laterality Date   ANTERIOR CERVICAL DECOMP/DISCECTOMY FUSION  03-08-2003 @ [facility]   C3 --- C6   ARTHROTOMY Right 11/09/2015   Procedure: RIGHT WRIST DISTAL RADIAL ULNAR JOINT ARTHROTOMY AND DEBRIDEMENT,  AND ;  Surgeon: Bradly Bienenstock, MD;  Location: [facility] OR;  Service: Orthopedics;  Laterality: Right;   BRONCHIAL BIOPSY  07/28/2020   Procedure: BRONCHIAL BIOPSIES;  Surgeon: Josephine Igo, DO;  Location: [facility];  Service: Pulmonary;;   BRONCHIAL BIOPSY  08/17/2021   Procedure: BRONCHIAL BIOPSIES;  Surgeon: Josephine Igo, DO;  Location: [facility];  Service: Pulmonary;;   BRONCHIAL BIOPSY  01/17/2023   Procedure: BRONCHIAL BIOPSIES;  Surgeon: Josephine Igo, DO;  Location: [facility];  Service: Cardiopulmonary;;   BRONCHIAL BRUSHINGS  07/28/2020   Procedure: BRONCHIAL BRUSHINGS;  Surgeon: Josephine Igo, DO;  Location: [facility];  Service: Pulmonary;;   BRONCHIAL BRUSHINGS  08/17/2021   Procedure: BRONCHIAL BRUSHINGS;  Surgeon: Josephine Igo, DO;  Location: [facility];  Service: Pulmonary;;   BRONCHIAL NEEDLE ASPIRATION BIOPSY  07/28/2020   Procedure: BRONCHIAL NEEDLE ASPIRATION BIOPSIES;  Surgeon: Josephine Igo, DO;  Location: [facility];  Service: Pulmonary;;   BRONCHIAL NEEDLE ASPIRATION BIOPSY  01/17/2023   Procedure: BRONCHIAL NEEDLE ASPIRATION BIOPSIES;  Surgeon: Josephine Igo, DO;  Location: [facility];  Service: Cardiopulmonary;;   BRONCHIAL WASHINGS  07/28/2020   Procedure: BRONCHIAL WASHINGS;  Surgeon: Josephine Igo, DO;  Location: [facility];  Service: Pulmonary;;   BRONCHIAL WASHINGS  08/17/2021   Procedure: BRONCHIAL WASHINGS;  Surgeon: Josephine Igo, DO;  Location: [facility];  Service: Pulmonary;;   COLONOSCOPY     CYSTOSCOPY WITH RETROGRADE PYELOGRAM, URETEROSCOPY AND STENT PLACEMENT N/A 09/18/2020   Procedure: CYSTOSCOPY WITH RETROGRADE PYELOGRAM bilateral,URETEROSCOPY AND  STENT PLACEMENT right ureter;  Surgeon: Sebastian Ache, MD;  Location: [facility];  Service: Urology;  Laterality: N/A;  1 HR   HARDWARE REMOVAL Right 07/24/2013   Procedure: RIGHT WRIST HARDWARE REMOVAL, JOINT RELEASE, RIGHT HAND MANIPULATION UNDER ANESTHESIA;  Surgeon: Sharma Covert, MD;   Location: [facility];  Service: Orthopedics;  Laterality: Right;   MASS EXCISION Left 10/14/2021   Procedure: EXCISION LEFT BACK CYSTIC MASS;  Surgeon: Sheliah Hatch, De Blanch, MD;  Location: [facility];  Service: General;  Laterality: Left;   OPEN REDUCTION INTERNAL FIXATION (ORIF) DISTAL RADIAL FRACTURE Right 11/08/2012   Procedure: OPEN REDUCTION INTERNAL FIXATION (ORIF) RIGHT DISTAL RADIUS FRACTURE;  Surgeon: Sharma Covert, MD;  Location: [facility];  Service: Orthopedics;  Laterality: Right;   TRANSURETHRAL RESECTION OF BLADDER TUMOR N/A 09/18/2020   Procedure: TRANSURETHRAL RESECTION OF BLADDER TUMOR (TURBT);  Surgeon: Sebastian Ache, MD;  Location: [facility];  Service: Urology;  Laterality: N/A;   TRANSURETHRAL RESECTION OF BLADDER TUMOR N/A 11/04/2020   Procedure: RESTAGING TRANSURETHRAL RESECTION OF BLADDER TUMOR (TURBT); BILATERAL RETROGRADE PYELOGRAM;  Surgeon: Sebastian Ache, MD;  Location: [facility];  Service: Urology;  Laterality: N/A;  1 HR   VIDEO BRONCHOSCOPY  01/17/2023   Procedure: VIDEO BRONCHOSCOPY WITHOUT FLUORO;  Surgeon: Josephine Igo, DO;  Location: [facility];  Service: Cardiopulmonary;;   VIDEO BRONCHOSCOPY WITH ENDOBRONCHIAL NAVIGATION N/A 07/28/2020   Procedure: VIDEO BRONCHOSCOPY WITH ENDOBRONCHIAL NAVIGATION;  Surgeon: Josephine Igo, DO;  Location: [facility];  Service: Pulmonary;  Laterality: N/A;   VIDEO BRONCHOSCOPY WITH RADIAL ENDOBRONCHIAL ULTRASOUND  08/17/2021   Procedure: VIDEO BRONCHOSCOPY WITH  RADIAL ENDOBRONCHIAL ULTRASOUND;  Surgeon: Josephine Igo, DO;  Location: [facility];  Service: Pulmonary;;   WRIST ARTHROPLASTY Right 11/09/2015   Procedure: OR DISTAL ULNAR RESECTION ARTHROPLASTY ;  Surgeon: Bradly Bienenstock, MD;  Location: [facility] OR;  Service: Orthopedics;  Laterality: Right;   WRIST ARTHROSCOPY WITH DEBRIDEMENT Right 07/21/2014   Procedure: RIGHT WRIST DISTAL RADIAL ULNAR JOINT DEBRIDEMENT AND  JOINT RELEASE POSSIBLE TENDON INTERPOSITION;  Surgeon: Sharma Covert, MD;  Location: [facility] OR;  Service: Orthopedics;  Laterality: Right;    Allergies: Codeine and Prednisone  Medications: Prior to Admission medications   Medication Sig Start Date End Date Taking? Authorizing Provider  albuterol (VENTOLIN HFA) 108 (90 Base) MCG/ACT inhaler USE 2 INHALATIONS BY MOUTH INTO  THE LUNGS EVERY 6 HOURS AS  NEEDED FOR SHORTNESS OF BREATH  OR WHEEZING 02/16/23   Fayette Pho, MD  alendronate (FOSAMAX) 70 MG tablet Take 1 tablet (70 mg total) by mouth every 7 (seven) days. Take with a full glass of water on an empty stomach. 12/27/22   Fayette Pho, MD  amLODipine (NORVASC) 10 MG tablet Take 1 tablet (10 mg total) by mouth daily. 10/20/22   Eber Hong, MD  apixaban (ELIQUIS) 5 MG TABS tablet Take 1 tablet (5 mg total) by mouth 2 (two) times daily. 01/13/23   Lincoln Brigham, MD  atorvastatin (LIPITOR) 40 MG tablet TAKE 1 TABLET BY MOUTH AT  BEDTIME 07/04/22   Sabino Dick, DO  baclofen (LIORESAL) 10 MG tablet Take 0.5 tablets (5 mg total) by mouth 3 (three) times daily. 06/08/21   Cresenzo, Cyndi Lennert, MD  Budeson-Glycopyrrol-Formoterol (BREZTRI AEROSPHERE) 160-9-4.8 MCG/ACT AERO Inhale 2 puffs into the lungs in the morning and at bedtime. 11/18/22   Fayette Pho, MD  calcium-vitamin D [facility] Rummage WITH D) 500-5 MG-MCG tablet Take 1 tablet by mouth daily with breakfast. 10/03/22   Fayette Pho, MD  cefdinir (OMNICEF) 300 MG capsule Take 1 capsule (300 mg total) by mouth every 12 (twelve) hours. 01/13/23   Lincoln Brigham, MD  Cholecalciferol (VITAMIN D) 50 MCG (2000 UT) tablet Take 1 tablet (2,000 Units total) by mouth daily. 10/03/22   Fayette Pho, MD  citalopram (CELEXA) 10 MG tablet Take 1 tablet (10 mg total) by mouth daily. Follow up in 3-4 weeks for side effect check. 12/06/22   Fayette Pho, MD  diclofenac Sodium (VOLTAREN) 1 % GEL Apply 2 g topically 4 (four) times daily. Apply to painful area of ribs.  Need to use regularly for relief. 01/19/21   Fayette Pho, MD  diltiazem (CARDIZEM CD) 120 MG 24 hr capsule Take 1 capsule (120 mg total) by mouth daily. 01/14/23   Lincoln Brigham, MD  fentaNYL (DURAGESIC) 25 MCG/HR Place 1 patch onto the skin every 3 (three) days. 12/26/22   Fayette Pho, MD  fluticasone [facility]) 50 MCG/ACT nasal spray SHAKE LIQUID AND USE 2 SPRAYS IN [facility] NOSTRIL DAILY 05/02/22   Fayette Pho, MD  gabapentin (NEURONTIN) 600 MG tablet Take 1 tablet (600 mg total) by mouth 3 (three) times daily. 08/19/22   Fayette Pho, MD  hydrOXYzine (ATARAX) 50 MG tablet Take 2 tablets (100 mg total) by mouth at bedtime as needed for anxiety or itching (sleep). 11/30/22   Fayette Pho, MD  ibuprofen (ADVIL) 800 MG tablet Take 1 tablet (800 mg total) by mouth every 8 (eight) hours as needed. Patient taking differently: Take 800 mg by mouth every 8 (eight) hours as needed for headache or mild pain. 10/14/21   Kinsinger, De Blanch,  MD  lidocaine (LIDODERM) 5 % Place 1 patch onto the skin daily. Remove & Discard patch within 12 hours or as directed by MD 08/19/22   Fayette Pho, MD  MELATONIN GUMMIES PO Take 10 mg by mouth at bedtime.    [provider]  metoprolol tartrate (LOPRESSOR) 25 MG tablet Take 1 tablet (25 mg total) by mouth 2 (two) times daily. 01/13/23   Lincoln Brigham, MD  mirtazapine (REMERON) 15 MG tablet Take 1 tablet (15 mg total) by mouth at bedtime. 01/13/23   Lincoln Brigham, MD  naloxone [facility]) nasal spray 4 mg/0.1 mL Use for suspected opioid overdose - too sleepy to wake up, difficulty breathing 05/13/22   Fayette Pho, MD  ondansetron (ZOFRAN) 4 MG tablet Take 1 tablet (4 mg total) by mouth every 8 (eight) hours as needed for nausea or vomiting. Take one tablet before starting each prep. 12/14/22   Jenel Lucks, MD  ondansetron (ZOFRAN-ODT) 4 MG disintegrating tablet Take 1 tablet (4 mg total) by mouth every 8 (eight) hours as needed for nausea or vomiting. 01/02/23    Fayette Pho, MD  pantoprazole (PROTONIX) 40 MG tablet Take 1 tablet (40 mg total) by mouth daily as needed (acid indigestion/heartburn.). 05/27/22   Fayette Pho, MD  polyethylene glycol powder [facility]) 17 GM/SCOOP powder Take 17 g by mouth 2 (two) times daily as needed. Patient taking differently: Take 17 g by mouth 2 (two) times daily as needed for mild constipation. 05/27/22   Fayette Pho, MD  senna (SENOKOT) 8.6 MG TABS tablet Take 1 tablet (8.6 mg total) by mouth at bedtime. 05/27/22   Fayette Pho, MD  triamcinolone cream (KENALOG) 0.1 % Apply 1 Application topically 2 (two) times daily. 12/06/22   Fayette Pho, MD     Family History  Problem Relation Age of Onset   Hypertension Mother    Hyperlipidemia Mother    Dementia Father    Lung cancer Father    Parkinson's disease Father    Early death Sister 76       Drug overdose   Throat cancer Brother    Hypertension Daughter    Diabetes Daughter     Social History   Socioeconomic History   Marital status: Married    Spouse name: Mildred Tuccillo   Number of children: 2   Years of education: 12   Highest education level: Not on file  Occupational History   Occupation: Unemployed     Employer:  [employer]    Comment: Sweep   Tobacco Use   Smoking status: Former    Packs/day: 1.00    Years: 50.00    Additional pack years: 0.00    Total pack years: 50.00    Types: Cigarettes    Start date: 04/13/1975    Quit date: 07/14/2020    Years since quitting: 2.6    Passive exposure: Past   Smokeless tobacco: Never  Vaping Use   Vaping Use: Never used  Substance and Sexual Activity   Alcohol use: Yes    Comment: Holidays only   Drug use: Never   Sexual activity: Not Currently    Partners: Male    Birth control/protection: Post-menopausal  Other Topics Concern   Not on file  Social History Narrative   Patient lives alone currently in [place].    Patient is married, they are living separately at this  time.    Patient enjoys watching tv, cooking, and spending time with her children and grandchildren.  Social Determinants of Health   Financial Resource Strain: Low Risk  (12/13/2022)   Overall Financial Resource Strain (CARDIA)    Difficulty of Paying Living Expenses: Not hard at all  Food Insecurity: No Food Insecurity (01/13/2023)   Hunger Vital Sign    Worried About Running Out of Food in the Last Year: Never true    Ran Out of Food in the Last Year: Never true  Transportation Needs: No Transportation Needs (01/13/2023)   PRAPARE - Administrator, Civil Service (Medical): No    Lack of Transportation (Non-Medical): No  Physical Activity: Inactive (12/13/2022)   Exercise Vital Sign    Days of Exercise per Week: 0 days    Minutes of Exercise per Session: 0 min  Stress: No Stress Concern Present (12/13/2022)   [employer] of Occupational Health - Occupational Stress Questionnaire    Feeling of Stress : Not at all  Social Connections: Moderately Integrated (12/13/2022)   Social Connection and Isolation Panel [NHANES]    Frequency of Communication with Friends and Family: More than three times a week    Frequency of Social Gatherings with Friends and Family: More than three times a week    Attends Religious Services: More than 4 times per year    Active Member of [employer] or Organizations: Yes    Attends Engineer, structural: More than 4 times per year    Marital Status: Separated      Review of Systems denies fever,HA, worsening abd pain,N/V or bleeding ; does have chest discomfort, dyspnea, occ cough  Vital Signs: BP 131/84 (BP Location: Left Arm)   Pulse 74   Temp 98.8 F (37.1 C) (Oral)   Resp 19   Ht 5\' 8"  (1.727 m)   Wt 198 lb 10.2 oz (90.1 kg)   SpO2 (!) 84%   BMI 30.20 kg/m   Advance Care Plan:  no documents on file   Physical Exam: awake, trach/NG tube in place; FC, answers questions; chest- dim BS bases; heart - RRR; abd- soft,+BS,NT; no LE  edema  Imaging: CT ABDOMEN WO CONTRAST  Result Date: 02/20/2023 CLINICAL DATA:  Dysphagia. Evaluate anatomy prior to gastrostomy tube placement. Known lung cancer. EXAM: CT ABDOMEN WITHOUT CONTRAST TECHNIQUE: Multidetector CT imaging of the abdomen was performed following the standard protocol without IV contrast. RADIATION DOSE REDUCTION: This exam was performed according to the departmental dose-optimization program which includes automated exposure control, adjustment of the mA and/or kV according to patient size and/or use of iterative reconstruction technique. COMPARISON:  CT scan the chest 01/16/2023; PET-CT 12/16/2022 FINDINGS: Lower chest: Severe bullous emphysema in the periphery of the right lower lobe. Right infrahilar mass again noted and better demonstrated on prior PET-CT imaging. Probable metastatic nodule in the inferior right lower lobe measuring up to 1.9 cm. Small bilateral pleural effusions. Overall improved infiltrative and consolidative airspace disease in the right and left lower lobes. The intracardiac blood pool is hypodense relative to the adjacent myocardium consistent with anemia. Moderate pericardial effusion. Hepatobiliary: No focal liver abnormality is seen. No gallstones, gallbladder wall thickening, or biliary dilatation. Pancreas: Unremarkable. No pancreatic ductal dilatation or surrounding inflammatory changes. Spleen: No splenic injury or perisplenic hematoma. Adrenals/Urinary Tract: Normal adrenal glands. Chronic right hydronephrosis secondary to UPJ obstruction simple cyst exophytic from the upper pole of the right kidney. No hydronephrosis on the left. Stomach/Bowel: Normal gastric anatomy. Feeding tube present in the body of the stomach. Colonic diverticular disease without CT evidence of active  inflammation. No evidence of bowel obstruction. Vascular/Lymphatic: Limited evaluation in the absence of intravenous contrast. Atherosclerotic calcifications visualized in the  abdominal aorta. Other: New subcutaneous nodule along the right lateral abdominal wall measures 0.8 cm (image 40 series 2). New mass adjacent to the pancreas and just anterior to the left pararenal space measures 2 cm (image 35 series 2). Both likely represent metastatic implants. Musculoskeletal: No acute or significant osseous findings. IMPRESSION: 1. Anatomy is suitable for percutaneous gastrostomy tube placement. 2. Progressive metastatic lung cancer with new subcutaneous and left upper abdominal/retroperitoneal soft tissue nodules. 3. Improved pneumonia/aspiration with some residual consolidative airspace disease in the left lower lobe when compared to prior imaging from 01/16/2023. 4. Persistent bilateral pleural effusions. 5. Moderate pericardial effusion. 6. The intracardiac blood pool is hypodense relative to the adjacent myocardium consistent with anemia. 7. Right hilar mass and right lower lobe pulmonary nodule consistent with known lung cancer. 8. Severe bullous pulmonary emphysema on the right. 9. Aortic Atherosclerosis (ICD10-I70.0) and Emphysema (ICD10-J43.9). Electronically Signed   By: Malachy Moan M.D.   On: 02/20/2023 07:41   DG CHEST PORT 1 VIEW  Result Date: 02/17/2023 CLINICAL DATA:  Hypoxia. EXAM: PORTABLE CHEST 1 VIEW COMPARISON:  February 16, 2023. FINDINGS: Stable cardiomediastinal silhouette. Tracheostomy and feeding tubes are unchanged in position. Right-sided PICC line is unchanged. Stable bibasilar opacities are noted concerning for atelectasis or pneumonia. Small pleural effusions may be present. Bony thorax is unremarkable. IMPRESSION: Stable support apparatus. Stable bibasilar opacities as described above. Electronically Signed   By: Lupita Raider M.D.   On: 02/17/2023 08:29   DG CHEST PORT 1 VIEW  Result Date: 02/16/2023 CLINICAL DATA:  Hypoxia, respiratory failure. EXAM: PORTABLE CHEST 1 VIEW COMPARISON:  02/15/2023. FINDINGS: The heart size and mediastinal contours are  stable. The pulmonary vasculature is distended. Interstitial prominence and strandy airspace opacities are noted bilaterally. There are small bilateral pleural effusions. No pneumothorax. A tracheostomy tube terminates 4.2 cm above the carina. An enteric tube courses over the stomach and out of the field of view. Cervical spinal fusion hardware is noted. IMPRESSION: 1. Cardiomegaly with distended pulmonary vasculature. 2. Interstitial and airspace opacities in the lungs bilaterally, slightly increased on the right. 3. Small bilateral pleural effusions. Electronically Signed   By: Thornell Sartorius M.D.   On: 02/16/2023 20:19   DG CHEST PORT 1 VIEW  Result Date: 02/15/2023 CLINICAL DATA:  Hypoxia. EXAM: PORTABLE CHEST 1 VIEW COMPARISON:  February 13, 2023. FINDINGS: Stable cardiomegaly. Tracheostomy tube is unchanged. Feeding tube is seen entering stomach. Right-sided PICC line is unchanged. Stable bibasilar subsegmental atelectasis is noted with probable small pleural effusions. Bony thorax is unremarkable. IMPRESSION: Stable support apparatus. Stable bibasilar opacities as described above. Electronically Signed   By: Lupita Raider M.D.   On: 02/15/2023 11:38   DG Chest Port 1 View  Result Date: 02/13/2023 CLINICAL DATA:  [id number] Respiratory failure (HCC) [phone] EXAM: PORTABLE CHEST 1 VIEW COMPARISON:  CXR 02/09/23 FINDINGS: Weighted enteric tube courses below diaphragm is visualized to the level of the stomach. Right-sided PICC with the tip in the lower SVC. Cardiomegaly. Small bilateral pleural effusions, increased on the right. There are prominent bilateral interstitial opacities are favored to represent mild pulmonary edema. No radiographically apparent displaced rib fractures. Visualized upper abdomen is unremarkable. Partially visualized cervical spinal fusion hardware in place. Degenerative changes of the bilateral glenohumeral joints. IMPRESSION: 1. Cardiomegaly with small bilateral pleural effusions, slightly  increased on the right. 2. Prominent bilateral  interstitial opacities are favored to represent mild pulmonary edema. Electronically Signed   By: Lorenza Cambridge M.D.   On: 02/13/2023 07:38   DG Chest 1 View  Result Date: 02/09/2023 CLINICAL DATA:  67 year old female with pneumonia, lung cancer. EXAM: CHEST  1 VIEW COMPARISON:  Portable chest 01/30/2023 and earlier. Most recent chest CT, CTA 01/16/2023. FINDINGS: Portable AP semi upright view at 0935 hours. Stable tracheostomy. Enteric feeding tube tip projects at the mid to distal gastric body. Increased gas within the stomach now. Stable right PICC line. Ongoing abnormal bilateral lung base opacity, mildly improved on the right since 01/30/2023. Stable cardiac size and mediastinal contours. No superimposed pneumothorax or pulmonary edema. Improved costophrenic angle ventilation, small pleural effusions may have resolved. No areas of worsening ventilation. Stable visualized osseous structures. Prior cervical ACDF. IMPRESSION: 1. Ongoing bibasilar lung opacity with only mildly improved ventilation since 01/30/2023. No new cardiopulmonary abnormality. 2. Stable lines and tubes; enteric feeding tube terminates at the mid or distal 3rd stomach. Electronically Signed   By: Odessa Fleming M.D.   On: 02/09/2023 11:27   DG Abd 1 View  Result Date: 02/04/2023 CLINICAL DATA:  Nasogastric tube placement. EXAM: ABDOMEN - 1 VIEW COMPARISON:  01/27/2023 FINDINGS: Tip of the weighted enteric tube is in the left upper quadrant in the region of the mid stomach. Nonspecific upper abdominal bowel gas pattern without significant bowel distention. IMPRESSION: Tip of the weighted enteric tube in the mid stomach. Electronically Signed   By: Narda Rutherford M.D.   On: 02/04/2023 20:18   DG Chest Port 1 View  Result Date: 01/30/2023 CLINICAL DATA:  Respiratory failure EXAM: PORTABLE CHEST 1 VIEW COMPARISON:  CXR 01/27/23 FINDINGS: Tracheostomy in place. Partially visualized cervical  spinal fusion hardware in place. Enteric tube courses below diaphragm with the tip out of the field of view. Right arm PICC with the tip near the cavoatrial junction. Redemonstrated are small bilateral pleural effusions, unchanged from prior exam. Compared to prior exam there is interval increase in bilateral infrahilar airspace opacities could represent atelectasis or infection. No radiographically apparent displaced rib fractures. Visualized upper abdomen is unremarkable. IMPRESSION: 1. Compared to prior exam there is interval increase in bilateral infrahilar airspace opacities that could represent atelectasis, infection, or changes related to aspiration. 2. Unchanged small bilateral pleural effusions. Electronically Signed   By: Lorenza Cambridge M.D.   On: 01/30/2023 10:26   DG Abd 1 View  Result Date: 01/27/2023 CLINICAL DATA:  Nasogastric tube placement EXAM: ABDOMEN - 1 VIEW COMPARISON:  01/26/2023 FINDINGS: Partially visualized tracheostomy tube tip. Esophageal tube tip overlies the gastric fundal area. Mild nonspecific air distension of bowel. Airspace disease at both bases. IMPRESSION: Esophageal tube tip overlies the gastric fundal area. Electronically Signed   By: Jasmine Pang M.D.   On: 01/27/2023 17:23   DG Chest Port 1 View  Result Date: 01/27/2023 CLINICAL DATA:  Status post tracheostomy EXAM: PORTABLE CHEST 1 VIEW COMPARISON:  01/26/2023 FINDINGS: Single frontal view of the chest demonstrates interval placement of a tracheostomy, tip overlying tracheal air column midway between thoracic inlet and carina. Right-sided PICC unchanged. The enteric catheter has been removed. The cardiac silhouette is stable. Persistent bibasilar consolidation and small bilateral effusions, right greater than left. No pneumothorax. IMPRESSION: 1. No complication after tracheostomy placement. 2. Persistent bibasilar consolidation and effusions, right greater than left. No appreciable change since prior exam.  Electronically Signed   By: Sharlet Salina M.D.   On: 01/27/2023 15:10  DG Abd 1 View  Result Date: 01/26/2023 CLINICAL DATA:  Nasogastric tube placement EXAM: ABDOMEN - 1 VIEW COMPARISON:  Yesterday FINDINGS: Nasogastric terminates over the distal gastric body. Numerous leads and wires project over the lower chest and upper abdomen. Gastric distention is improved, mild. No gross free intraperitoneal air or bowel obstruction. Bibasilar airspace disease. IMPRESSION: Nasogastric tube terminating at the distal gastric body. Electronically Signed   By: Jeronimo Greaves M.D.   On: 01/26/2023 17:04   DG CHEST PORT 1 VIEW  Result Date: 01/26/2023 CLINICAL DATA:  Respiratory failure EXAM: PORTABLE CHEST 1 VIEW COMPARISON:  01/25/2023 FINDINGS: Support apparatus: Endotracheal tube terminates 2.0 cm above carina. Right-sided PICC line terminates at the low SVC. Nasogastric tube extends beyond the inferior aspect of the film. Numerous leads and wires project over the chest. Heart/mediastinum: Midline trachea. Normal heart size for level of inspiration. Pleura: Suspect tiny bilateral pleural effusions. No apical pneumothorax. Relative lucency in the inferolateral right hemithorax is similar over prior exams and favored to represent relatively well-aerated lateral right lower lobe in the setting of bibasilar airspace disease. Lungs: Improved aeration. Decreased right-greater-than-left interstitial opacities. Persistent, slightly improved bibasilar airspace disease. Other: None IMPRESSION: Improved aeration, likely due to a combination of decreased interstitial edema and pneumonia. Probable small bilateral pleural effusions. Electronically Signed   By: Jeronimo Greaves M.D.   On: 01/26/2023 17:02   DG Abd 1 View  Result Date: 01/25/2023 CLINICAL DATA:  Orogastric tube placement. EXAM: ABDOMEN - 1 VIEW COMPARISON:  Radiographs 01/17/2023. FINDINGS: 1211 hours. Enteric tube projects over the distal stomach. The stomach appears  moderately distended. No significant bowel distention identified. No supine evidence of pneumoperitoneum. Multiple telemetry leads overlie the abdomen. IMPRESSION: Enteric tube projects over the distal stomach. The stomach appears moderately distended. Electronically Signed   By: Carey Bullocks M.D.   On: 01/25/2023 12:45   DG CHEST PORT 1 VIEW  Result Date: 01/25/2023 CLINICAL DATA:  Intubation and orogastric tube placement. EXAM: PORTABLE CHEST 1 VIEW COMPARISON:  Radiographs 01/23/2023 and 01/21/2023.  CT 01/16/2023. FINDINGS: 1209 hours. Tip of the endotracheal tube is 2.0 cm above the carina, similar to prior study. Enteric tube projects below the diaphragm. There is a right arm PICC which projects to the level of the superior cavoatrial junction. The heart size and mediastinal contours are stable. Persistent bibasilar airspace opacities and small bilateral pleural effusions with interval increased diffuse interstitial opacities suspicious for superimposed edema. No evidence of pneumothorax or acute osseous abnormality. IMPRESSION: 1. Endotracheal tube tip remains 2.0 cm above the carina. 2. Increased interstitial opacities suspicious for superimposed edema. Otherwise stable basilar airspace opacities and small pleural effusions. Electronically Signed   By: Carey Bullocks M.D.   On: 01/25/2023 12:43   DG Chest Port 1 View  Result Date: 01/23/2023 CLINICAL DATA:  Hypoxia EXAM: PORTABLE CHEST 1 VIEW COMPARISON:  Chest radiograph dated 01/21/2023 FINDINGS: Lines/tubes: Endotracheal tube tip projects 2.3 cm above the carina. Gastric/enteric tube tip projects over the stomach. Right upper extremity PICC tip projects over the superior cavoatrial junction. Chest: Low lung volumes with bronchovascular crowding. Similar bibasilar hazy and interstitial opacities. Pleura: Similar small bilateral pleural effusions.  No pneumothorax. Heart/mediastinum: Similar  cardiomediastinal silhouette. Bones: Cervical spinal  fixation hardware appears intact. IMPRESSION: 1. Similar mild pulmonary edema. Similar small bilateral pleural effusions. 2. Bibasilar hazy opacities may reflect atelectasis, aspiration, or pneumonia. 3. Support lines and tubes as above. Electronically Signed   By: Milus Height.D.  On: 01/23/2023 10:23    Labs:  CBC: Recent Labs    02/17/23 0434 02/17/23 0941 02/18/23 0452 02/19/23 0358 02/20/23 0413  WBC 6.1  --  9.9 12.5* 9.8  HGB 6.9* 7.8* 9.0* 9.2* 8.0*  HCT 22.7* 26.4* 29.1* 30.2* 25.6*  PLT 299  --  436* 441* 440*    COAGS: Recent Labs    01/19/23 0550 01/19/23 1641 01/20/23 0043 01/20/23 0640  APTT 101* 127* 97* 80*    BMP: Recent Labs    02/17/23 0434 02/18/23 0452 02/19/23 0358 02/20/23 0413  NA 135 137 138 137  K 4.3 4.6 4.5 4.0  CL 101 100 102 99  CO2 24 26 29 28   GLUCOSE 175* 149* 152* 138*  BUN 20 26* 28* 30*  CALCIUM 7.8* 8.8* 9.1 8.7*  CREATININE 0.61 0.74 0.60 0.68  GFRNONAA >60 >60 >60 >60    LIVER FUNCTION TESTS: Recent Labs    01/24/23 0515 01/29/23 0321 01/31/23 0329 02/03/23 0541  BILITOT 0.3 0.7 0.9 0.7  AST 25 18 16 28   ALT 50* 43 32 47*  ALKPHOS 65 70 72 83  PROT 5.7* 6.5 6.5 6.9  ALBUMIN 2.0* 2.5* 2.5* 2.4*    TUMOR MARKERS: No results for input(s): "AFPTM", "CEA", "CA199", "CHROMGRNA" in the last 8760 hours.  Assessment and Plan: 66 y.o. female with PMH sig for anxiety/depression, bladder cancer 2022, lung cancer , COPD, DDD, HTN,HLD, anemia, GERD, fibroid who was admitted to [facility] on 5/1 with N/V, diarrhea, abd pain. Found to have pneumonia/resp failure new onset afib. Transferred to [facility] for cancer treatment. Subsequently intubated for resp failure, trach placed 5/17. NG tube in place. On precedex. She is afebrile, WBC nl, hgb 8, plts 440k, creat 0.68, PT/INR pend. Request now received for percutaneous gastrostomy tube placement. CT A/P done yesterday revealed:  1. Anatomy is suitable for percutaneous gastrostomy tube  placement. 2. Progressive metastatic lung cancer with new subcutaneous and left upper abdominal/retroperitoneal soft tissue nodules. 3. Improved pneumonia/aspiration with some residual consolidative airspace disease in the left lower lobe when compared to prior imaging from 01/16/2023. 4. Persistent bilateral pleural effusions. 5. Moderate pericardial effusion. 6. The intracardiac blood pool is hypodense relative to the adjacent myocardium consistent with anemia. 7. Right hilar mass and right lower lobe pulmonary nodule consistent with known lung cancer. 8. Severe bullous pulmonary emphysema on the right. 9. Aortic Atherosclerosis (ICD10-I70.0) and Emphysema  Imaging studies were reviewed by Dr. Fredia Sorrow. Risks and benefits image guided gastrostomy tube placement was discussed with the patient /daughter including, but not limited to the need for a barium enema during the procedure, bleeding, infection, peritonitis and/or damage to adjacent structures.  All of the patient's questions were answered, patient is agreeable to proceed.  Consent signed and in chart. Procedure tent planned for later today schedule permitting    Thank you for this interesting consult.  I greatly enjoyed meeting [name] and look forward to participating in their care.  A copy of this report was sent to the requesting provider on this date.  Electronically Signed: D. Jeananne Rama, PA-C 02/20/2023, 10:19 AM   I spent a total of  25 minutes   in face to face in clinical consultation, greater than 50% of which was counseling/coordinating care for percutaneous gastrostomy tube placement

## 2023-02-20 NOTE — Progress Notes (Addendum)
Precedex increased to 1.6 mcg/kg/hr per verbal order by IR physician Dr. Fredia Sorrow for procedure sedation. This nurse resumed regular rate at 0.8 mcg/kg/hr after procedure was aborted.

## 2023-02-20 NOTE — Consult Note (Addendum)
WOC Nurse Consult Note: Reason for Consult: Consult requested for sacrum.  Pt has red moist macerated skin; appearance is consistent with moisture associated skin damage.   ICD-10 CM Codes for Irritant Dermatitis.  No open wounds or pressure injuries noted at this time.  Pt already has Desitin cream and antifungal powder ordered and is on an air mattress to decrease pressure. Flexiseal in place to attempt to contain liquid stool, but patient still leaks around the insertion site.  Please re-consult if further assistance is needed.  Thank-you,  Cammie Mcgee MSN, RN, CWOCN, [place], CNS [phone]

## 2023-02-20 NOTE — Procedures (Signed)
Interventional Radiology Progress Note  Patient brought to IR for gastrostomy tube placement and orogastric tube placed for gastric insufflation under fluoroscopy. Patient has developed significant ileus of SB and colon since CT yesterday with distal transverse colon and splenic flexure interposed between stomach and abdominal wall. No safe route for percutaneous gastrostomy tube placement at this time. Recommend following bowel gas pattern with x-ray to determine if we could safely re-attempt gastrostomy tube placement. Procedure aborted for today.  Jodi Marble. Fredia Sorrow, M.D Pager:  [phone]

## 2023-02-20 NOTE — Progress Notes (Signed)
NAME:  [name], MRN:  [id number], DOB:  1955/12/19, LOS: 39 ADMISSION DATE:  01/11/2023, CONSULTATION DATE:  01/16/2023 REFERRING MD: FMTS, CHIEF COMPLAINT: Acute Respiratory Failure in the setting of Lung Cancer, COPD and infiltrate on CXR   History of Present Illness:  67 yo female former smoker presented to [facility] ER with nausea, vomiting, diarrhea and abdominal pain for 2 weeks.  Found to have RML and RLL post-obstructive pneumonia with Rt hilar mass, new onset A fib.  She required intubation on 5/06 for hypoxic respiratory failure.  Transferred to [facility] on 5/07 for cancer therapy.  Pertinent Medical History:  COPD, Adenocarcinoma LLL November 2021, Bladder cancer, GERD, Anxiety with panic attacks, Depression, Allergies  Significant Hospital Events: Including procedures, antibiotic start and stop dates in addition to other pertinent events   5/6 Transferred to ICU for acute Respiratory Failure , intubated  5/7 Bronch by Icard. significant extrinsic compression of the airways in the right lower lobe and middle lobe. This is likely malignant obstruction. Bx sent. Transferred to [facility] for rad/onc eval. PICC placed 5/8 Stable still high PEEP/FiO2 needs. Rad/onc consult requested  5/9 start radiation therapy 5/10 Tolerating SBT trial this AM on low-dose fentanyl and Precedex.  Propofol DC'd due to high triglycerides 5/11 Tolerates SBT's, pressure support 10/5 5/12 Off of pressors. Remains on vent, sedated with Precedex/Fentanyl. PMT consulted. 5/13 Weaning on PSV 14/8. > 10L positive over the course of admission. Diuresing. 5/14 Weaning on PSV. Ongoing diuresis. 5/15 Tolerating SBT, PSV 5/5, following commands. Continue diuresis, Foley placed for persistent urinary retention. Extubated, decompensated quickly after requiring reintubation. DHT placed. 5/16 Agitated overnight, c/o throat pain with ETT, RASS 1-2 despite sedation. Failed extubation. Tolerating SBT PSV 10/5, FiO2 35%. ~1630 desaturated  with inability to pass [place] suction/difficulty bagging; urgent tube exchange completed with bougie and blood clot removed with improvement in oxygenation/ventilation. 5/17 Tracheostomy placed 5/19 Back in atrial fibrillation in am, HR 150s 5/20 Weaned on pressure support 10/5 5/21 Desaturated during weaning. Still on dex and fent gtt 5/23 added fent patch and seroquel had to have NE gtt and midodrine started for drug related hypotension 5/24 Trialing fent gtt off. Foley removed 5/25 3 hrs. trach collar 5/26 Pressure support minimal trach collar 5/28 SIMV/PS, increased pain. Changed oxy fent patch to dilaudid incr seroquel 5/29 radiation. Off dilaudid gtt. Lower dose precedex requirement  5/30 try to wean precedex. Repeat anxiety during/after rad. Incr anxiolytics 6/01 change to pressure control as rest mode 6/4 trial vent valve and able to phonate, trach collar trial for ~1-2 hours surprisingly and wore PMV for 5 minutes and able to phonate well  6/6 intermittent plugging, underwent bronch but required surprisingly little therapeutic aspiration  6/6-6/7 required 3U pRBC transfusion total without clear source of blood loss. Therapeutic AC has been held in advance of g-tube placement on 6/10, remains on ppx lovenox.  Interim History / Subjective:   Required FiO2 increase to 50% yesterday morning due to mucous/tumor material partially obstructing trach No complaints.  TF ongoing Hgb drifting down a bit No fevers I&O -3L x 24 hours   Objective:  Blood pressure 105/61, pulse 83, temperature 98.9 F (37.2 C), temperature source Oral, resp. rate (!) 24, height 5\' 8"  (1.727 m), weight 90.1 kg, SpO2 95 %.    Vent Mode: PCV FiO2 (%):  [50 %-60 %] 50 % Set Rate:  [24 bmp] 24 bmp PEEP:  [5 cmH20] 5 cmH20 Plateau Pressure:  [18 cmH20-24 cmH20] 23 cmH20  Intake/Output Summary (Last 24 hours) at 02/20/2023 0735 Last data filed at 02/20/2023 0700 Gross per 24 hour  Intake 2768.16 ml  Output  5900 ml  Net -3131.84 ml    Filed Weights   02/17/23 0427 02/19/23 0355 02/20/23 0500  Weight: 86.6 kg 89.5 kg 90.1 kg   Physical Examination: General: middle aged female of normal body habitus in NAD  Neuro:  Alert, oriented, non-focal HEENT:  [place]/AT, PERRL, Trach in place. Cortrak.  Cardiovascular:  RRR, no MRG Lungs:  Coarse throughout right lung field. More clear on the left. No distress on full support. Breathing over set rate.  Abdomen:  Soft, non-distended, non-tender. Normoactive.  Musculoskeletal:  No acute deformity or ROM limitation Skin:  Intact, MMM  Resolved Hospital Problem List:  Hypertriglyceridemia due to propofol, Hyponatremia , Hypotension drug induced from sedation. Vocal cord edema, Post obstructive PNA: Zosyn completed 5/13, Hypotension from hypovolemia and sedation  Assessment & Plan:   Acute hypoxic respiratory failure from post-obstructive pneumonia and atelectasis in setting of COPD with emphysema. Failure to wean s/p tracheostomy. Bronchospasm Mucous Plugging - PS wean/trach collar as tolerated; use pressure control as rest mode. In line speaking valve trials per RT/SLP - anxiety, deconditioning, tumor burden limit ability to tolerate vent weaning - continue dexamethasone 6 mg IV daily - would probably continue through the rest of her radiation course unless we're running into issues with agitation/insomnia - Diuresed well yesterday. Planning to remove foley today. If she tolerates we will try to diurese further.  - goal SpO2 90 to 95% - continue brovana, pulmicort, yupelri - prn albuterol - routine trach care  Rt hilar mass with poorly differentiated NSCLC. Hx of LLL adenocarcinoma from November 2021. - continue radiation therapy per radiation oncology  Acute metabolic encephalopathy from sepsis and hypoxia. Hx of depression, anxiety, chronic cancer pain. - precedex for RASS goal 0 to +1. Try to wean some today.  - celexa 20 mg daily, buspar 20  mg bid - continue klonopin 2 mg BID,continue neurontin 600 mg tid (home med), dilaudid 1 mg q4h, continue remeron 15 mg qhs, continue seroquel 100 mg QHS  Paroxysmal atrial fibrillation. HLD, HTN. - hold eliquis in advance of likely g-tube placement - lipitor, hold norvasc   Anemia of critical illness, iron deficiency, and chronic disease, ICU phlebotomy - continue ferrous sulfate - f/u CBC - hold therapeutic AC as she has required 3U pRBC over course of 6/6-6/7 and will likely pursue g-tube today - Lovenox   Urine retention. - foley replaced 5/25  - continue bethanechol - DC foley today, bladder scans  Deconditioning. - PT/OT  Goals of care - revisited on 6/4, continuing current measures including goal to complete planned course of XRT (now 20/30 treatments completed)  Best Practice: (right click and "Reselect all SmartList Selections" daily)   Diet/type: tubefeeds DVT prophylaxis: Eliquis on hold in advance of g tube placement. Continue DVT chemoppx.  GI prophylaxis: H2B Lines: Central line - PICC Foley:  Yes Code Status:  full code Last date of multidisciplinary goals of care discussion: 6/4 - remains full code, wishes to continue XRT treatment  Labs:      Latest Ref Rng & Units 02/20/2023    4:13 AM 02/19/2023    3:58 AM 02/18/2023    4:52 AM  CMP  Glucose 70 - 99 mg/dL 829  562  130   BUN 8 - 23 mg/dL 30  28  26    Creatinine 0.44 - 1.00 mg/dL 8.65  0.60  0.74   Sodium 135 - 145 mmol/L 137  138  137   Potassium 3.5 - 5.1 mmol/L 4.0  4.5  4.6   Chloride 98 - 111 mmol/L 99  102  100   CO2 22 - 32 mmol/L 28  29  26    Calcium 8.9 - 10.3 mg/dL 8.7  9.1  8.8        Latest Ref Rng & Units 02/20/2023    4:13 AM 02/19/2023    3:58 AM 02/18/2023    4:52 AM  CBC  WBC 4.0 - 10.5 K/uL 9.8  12.5  9.9   Hemoglobin 12.0 - 15.0 g/dL 8.0  9.2  9.0   Hematocrit 36.0 - 46.0 % 25.6  30.2  29.1   Platelets 150 - 400 K/uL 440  441  436    ABG    Component Value Date/Time   PHART  7.550 (H) 01/17/2023 0343   PCO2ART 38.4 01/17/2023 0343   PO2ART 53 (L) 01/17/2023 0343   HCO3 29.9 (H) 02/13/2023 0916   TCO2 35 (H) 01/17/2023 0343   O2SAT 60.2 02/13/2023 0916    CBG (last 3)  Recent Labs    02/19/23 1937 02/20/23 0014 02/20/23 0346  GLUCAP 131* 128* 145*     Critical care time: 33 minutes    Joneen Roach, AGACNP-BC [facility]  See Amion for personal pager PCCM on call pager [phone] until 7pm. Please call Elink 7p-7a. [phone]  02/20/2023 7:44 AM

## 2023-02-20 NOTE — Progress Notes (Signed)
PT Cancellation Note  Patient Details Name: JANEIL SCHEXNAYDER MRN: [id number] DOB: 1956-06-28   Cancelled Treatment:     Per RN "Pt has a busy day today".  Radiation at 10:30 am, then suppose to undergo a Pacey Valve placement then if able a PEG placement.  Will attempt to see another day as schedule permits.    Felecia Shelling  PTA Acute  Rehabilitation Services Office M-F          [phone]

## 2023-02-20 NOTE — Sedation Documentation (Signed)
Procedure aborted no clear path to place g-tube

## 2023-02-21 ENCOUNTER — Inpatient Hospital Stay (HOSPITAL_COMMUNITY): Payer: 59

## 2023-02-21 ENCOUNTER — Ambulatory Visit
Admission: RE | Admit: 2023-02-21 | Discharge: 2023-02-21 | Disposition: A | Discharge status: Home / Self Care | Payer: 59 | Source: Ambulatory Visit | Attending: Radiation Oncology | Admitting: Radiation Oncology

## 2023-02-21 ENCOUNTER — Other Ambulatory Visit: Payer: Self-pay

## 2023-02-21 DIAGNOSIS — J9601 Acute respiratory failure with hypoxia: Secondary | ICD-10-CM | POA: Diagnosis not present

## 2023-02-21 DIAGNOSIS — C3432 Malignant neoplasm of lower lobe, left bronchus or lung: Secondary | ICD-10-CM | POA: Diagnosis not present

## 2023-02-21 DIAGNOSIS — C3431 Malignant neoplasm of lower lobe, right bronchus or lung: Secondary | ICD-10-CM | POA: Diagnosis not present

## 2023-02-21 DIAGNOSIS — Z510 Encounter for antineoplastic radiation therapy: Secondary | ICD-10-CM | POA: Diagnosis not present

## 2023-02-21 DIAGNOSIS — Z87891 Personal history of nicotine dependence: Secondary | ICD-10-CM | POA: Diagnosis not present

## 2023-02-21 DIAGNOSIS — J988 Other specified respiratory disorders: Secondary | ICD-10-CM | POA: Diagnosis not present

## 2023-02-21 DIAGNOSIS — I3139 Other pericardial effusion (noninflammatory): Secondary | ICD-10-CM

## 2023-02-21 DIAGNOSIS — R918 Other nonspecific abnormal finding of lung field: Secondary | ICD-10-CM | POA: Diagnosis not present

## 2023-02-21 LAB — BASIC METABOLIC PANEL
Anion gap: 9 (ref 5–15)
BUN: 29 mg/dL — ABNORMAL HIGH (ref 8–23)
CO2: 29 mmol/L (ref 22–32)
Calcium: 8.9 mg/dL (ref 8.9–10.3)
Chloride: 100 mmol/L (ref 98–111)
Creatinine, Ser: 0.58 mg/dL (ref 0.44–1.00)
GFR, Estimated: >60 mL/min (ref >60)
Glucose, Bld: 94 mg/dL (ref 70–99)
Potassium: 4.2 mmol/L (ref 3.5–5.1)
Sodium: 138 mmol/L (ref 135–145)

## 2023-02-21 LAB — MAGNESIUM: Magnesium: 1.9 mg/dL (ref 1.7–2.4)

## 2023-02-21 LAB — BPAM RBC: Blood Product Expiration Date: 202407102359

## 2023-02-21 LAB — RAD ONC ARIA SESSION SUMMARY
Course Elapsed Days: 34
Plan Fractions Treated to Date: 21
Plan Prescribed Dose Per Fraction: 2 Gy
Plan Total Fractions Prescribed: 27
Plan Total Prescribed Dose: 54 Gy
Reference Point Dosage Given to Date: 42 Gy
Reference Point Session Dosage Given: 2 Gy
Session Number: 24

## 2023-02-21 LAB — CBC
HCT: 26.2 % — ABNORMAL LOW (ref 36.0–46.0)
Hemoglobin: 8 g/dL — ABNORMAL LOW (ref 12.0–15.0)
MCH: 26.8 pg (ref 26.0–34.0)
MCHC: 30.5 g/dL (ref 30.0–36.0)
MCV: 87.9 fL (ref 80.0–100.0)
Platelets: 398 10*3/uL (ref 150–400)
RBC: 2.98 MIL/uL — ABNORMAL LOW (ref 3.87–5.11)
RDW: 15.1 % (ref 11.5–15.5)
WBC: 10.3 10*3/uL (ref 4.0–10.5)
nRBC: 0 % (ref 0.0–0.2)

## 2023-02-21 LAB — GLUCOSE, CAPILLARY
Glucose-Capillary: 110 mg/dL — ABNORMAL HIGH (ref 70–99)
Glucose-Capillary: 89 mg/dL (ref 70–99)
Glucose-Capillary: 168 mg/dL — ABNORMAL HIGH (ref 70–99)

## 2023-02-21 LAB — TYPE AND SCREEN

## 2023-02-21 LAB — ECHOCARDIOGRAM LIMITED
Height: 68 in
S' Lateral: 2.7 cm
Weight: 3178.15 oz

## 2023-02-21 LAB — PHOSPHORUS: Phosphorus: 4.6 mg/dL (ref 2.5–4.6)

## 2023-02-21 NOTE — Progress Notes (Signed)
Nutrition Follow-up  DOCUMENTATION CODES:   Severe malnutrition in context of acute illness/injury  INTERVENTION:  - Continue goal TF: Osmolite 1.5 @ 55 ml/hr (1320 ml/day) PROSource TF20 60 ml daily - Provides 2060 kcal, 103 grams of protein, and 1006 ml of H2O.   - FWF per CCM/MD.   - MVI with minerals daily per tube  - Monitor bowel movements.   - Monitor weight trends.   NUTRITION DIAGNOSIS:   Severe Malnutrition related to acute illness as evidenced by moderate fat depletion, moderate muscle depletion. *ongoing  GOAL:   Patient will meet greater than or equal to 90% of their needs *progressing, on TF which is titrating back to gaol  MONITOR:   Vent status, Labs, Weight trends, TF tolerance  REASON FOR ASSESSMENT:   Ventilator, Consult Enteral/tube feeding initiation and management  ASSESSMENT:   67 y.o. female with PMHx including adenocarcinoma of LLL now with recurrence, COPD, and anxiety presents with acute on chronic hypoxemic respiratory failure likely multifactorial  5/2 Admit 5/6 Intubated; Osm 1.5 started at 26mL/hr 5/8 Osm 1.5 increased to goal of 28mL/hr 5/15 Extubated by quickly re-intubated; TF restarted at 29mL/hr due to xray showing stomach distension 5/17 s/p trach  Patient being taken to radiation at time of visit.   Has continued on goal TF until yesterday when patient was scheduled for G-tube placement with IR. Unfortunately, this had to be aborted due to finding of ileus of SB and colon and due to the bowel migrating anterior to the stomach.  No note yet today from IR for update on G-tube placement. Of note, CT abdomen yesterday concerning for new nodular lesions in soft tissue and abdomen.  After G-tube was aborted yesterday, another NGT was placed, marked at 61cm. During RN assessment this morning, marking noted to be at 43cm. TF stopped. Xray revealed tube had come out to GE junction. Advanced to 60cm and xray shows tube appears to be  back in stomach but no read yet.  Diarrhea remains ongoing. Patient has received a total of 15 days of Banatrol BID. No major change in output noted. Question if trailing enteral nutrition with fiber could help however question if this could be too much fiber with concern for ileus. Will continue to monitor.    Weights have also continued to trend up: Date/Time Weight in lbs Net I/O since 5/28  02/21/23 0500 198.63 lbs +13.9L  02/19/23 0355 197.31 lbs +12.2L  02/17/23 0427 190.92 lbs +14L  02/14/23 0500 185.19 lbs +6.6L  02/09/23 0500 180.34 lbs +2L  02/08/23 0335 175.71 lbs +1.6L   02/02/23 0500 177.47 lbs   01/28/23 0422 172.62 lbs   01/26/23 0401 183.86 lbs   01/25/23 0419 179.68 lbs   01/24/23 0702 181 lbs   01/23/23 0500 194.89 lbs   01/22/23 0500 188.71 lbs   01/21/23 0500 184.3 lbs   01/19/23 0703 172.62 lbs   01/11/23 1658 174.6 lbs    Weight likely elevated due to ongoing generalized edema and overall fluid status. Will continue to monitor for need to adjust TF.   Medications reviewed and include: 300mg  ferrous sulfate, MVI, Remeron Banatrol (6/6-6/10)  Labs reviewed:  -   Diet Order:   Diet Order             Diet NPO time specified  Diet effective now                   EDUCATION NEEDS:  Education needs have been addressed  Skin:  Skin Assessment: Reviewed RN Assessment  Last BM:  6/11 - fecal management system  Height:  Ht Readings from Last 1 Encounters:  01/31/23 5\' 8"  (1.727 m)   Weight:  Wt Readings from Last 1 Encounters:  02/21/23 90.1 kg    BMI:  Body mass index is 30.2 kg/m.  Estimated Nutritional Needs:  Kcal:  1900-2100 Protein:  95-120 g Fluid:  >1.9 L    Shelle Iron RD, LDN For contact information, refer to [facility].

## 2023-02-21 NOTE — Progress Notes (Signed)
Speech Language Pathology Treatment: Cognitive-Linquistic  Patient Details Name: MAKHYA ARAVE MRN: [id number] DOB: Nov 16, 1955 Today's Date: 02/21/2023 Time: 3244-0102 SLP Time Calculation (min) (ACUTE ONLY): 10 min  Assessment / Plan / Recommendation Clinical Impression  SLP discussed with patient option to determine if free AAC programs may be desired to use on her Iphone given her inconsistent ability to use PMSV due to staffing coordination requirements. SLP demonstrated 2 options of AAC program and wrote down specific program for her daughter to download on her phone and for SLP to modify next date. After demonstration, pt agreeble to try AAC. In addition, discussed potential to see if daughter can come in 6/12 or 6/13 at 1400 to talk with pt while she is using her PMSV with RT/SLP present. Left written instructions for pt and requested RN to inform SLP if daughter agreeable. Pt currently has a AAC board, paper/pen to use for communication and is oriented to self and able to talk about her family including ggrand and grandchildren.     HPI HPI: Patient is a 67 y.o. female with PMH: COPD, recurrent adenocarcinoma of LLL, anxiety, GERD, epigastric pain, depression. She presented to the hospital on 01/11/2023 for nausea/vomiting/diarrhea and abdominal pain x 2 weeks. She was admitted for AHRF in setting of CAP/COPD exacerbation. She was found to have post-obstructive PNA (RML/RLL) and right hilar mass with progressive respiratory failure requiring intubation 5/6. In addition, she experienced panic/anxiety attacks leading to exacerbated WOB and VCD with associated stridor. On 5/15 she was extubated but quickly decomensated and was reintubated. Tracheostomy placed 5/17. She tolerated 3 hours of trach collar on 5/25.  Pt has been having anxiety and thus has not tolerated vent weaning.  Pt previously had used valve on the vent approx 20 minutes and then valve on trach collar for 5 minutes on Tuesday *6/4.   Further trials indicated as pt continues mostly on full vent support.      SLP Plan  Continue with current plan of care      Recommendations for follow up therapy are one component of a multi-disciplinary discharge planning process, led by the attending physician.  Recommendations may be updated based on patient status, additional functional criteria and insurance authorization.    Recommendations   AAC program for Iphone      PMSV Supervision: Full           Oral care BID   Frequent or constant Supervision/Assistance Aphonia (R49.1)     Continue with current plan of care    Rolena Infante, MS [facility] SLP Acute Rehab Services Office [phone]  Chales Abrahams  02/21/2023, 4:51 PM

## 2023-02-21 NOTE — Progress Notes (Signed)
Speech Language Pathology Treatment: [name] Speaking valve  Patient Details Name: [name] MRN: [id number] DOB: [birth year] Today's Date: 02/21/2023 Time: 5621-3086 SLP Time Calculation (min) (ACUTE ONLY): 40 min  Assessment / Plan / Recommendation Clinical Impression  PMSV trials continue in conjunction with RT. Initially when pt's cuff deflated and with valve placement attempt, pt with overt cough and expectoration of viscous secretions- after which RT provided suctioning. Pt tolerated her PMSV trials excellently today- using teal vent valve placed for approx 20 minutes, Purple valve on trach collar placed for approximately 30 minutes; Ms [name] tolerated this beautifully today and expressed gratitude for staff as well as demonstrating humor. Harsh vocal quality continues throughout entire trials but her vitals stayed stable overall. Baseline measues included : PEEP was 5, RR 24, PC above PEEP 22, 50% oxygen; HR 76, o2 sats 91%, RR 19. RT adjusted vent settings to accommodate air loss from cuff deflation increasing PC above PEEP to approx 30. Pt's vitals varied from baseline HR 94-115. Oxygen saturation 96-100%, and RR 19-24 - RR increased toward end of trial to 26-31 and pt fatigured with SLP removing valve and RT placing pt back on ventilator. A few occasions of air retention noted with PMSV removal evidenced by air loss on inhalation but pt overall tolerated well. Pt spoke to her daughter on the telephone twice. She continues to verbalize up to 5 words per breath group - with excellent voicing and breathing coordination. Her voice is raspy with ? vocal cord closure even on inhalation? Pt advised to anxiety at the very end of the session and thus was placed back on ventilator by RT. Pt was encouraged during session - and she demonstrated excellent verbal communication. SLP provided her with communication board to allow her nonverbal communication with staff. SLP will continue to follow for PMSV  tolerance -both on and off vent. Encouraged pt if she could tolerate being off the vent, she could use the purple valve with SLP and nursing. Excellent progress today as pt tolerated valve much longer than previously. Pt expressed gratitude to staff for helping her including Dr Craige Cotta, Joneen Roach, Reece Agar and nurse technician. Will continue treatment.     HPI HPI: Patient is a 67 y.o. female with PMH: COPD, recurrent adenocarcinoma of LLL, anxiety, GERD, epigastric pain, depression. She presented to the hospital on 01/11/2023 for nausea/vomiting/diarrhea and abdominal pain x 2 weeks. She was admitted for AHRF in setting of CAP/COPD exacerbation. She was found to have post-obstructive PNA (RML/RLL) and right hilar mass with progressive respiratory failure requiring intubation 5/6. In addition, she experienced panic/anxiety attacks leading to exacerbated WOB and VCD with associated stridor. On 5/15 she was extubated but quickly decomensated and was reintubated. Tracheostomy placed 5/17. She tolerated 3 hours of trach collar on 5/25.  Pt has been having anxiety and thus has not tolerated vent weaning.  Pt previously had used valve on the vent approx 20 minutes and then valve on trach collar for 5 minutes on Tuesday *6/4.  Further trials indicated as pt continues mostly on full vent support.      SLP Plan  Continue with current plan of care      Recommendations for follow up therapy are one component of a multi-disciplinary discharge planning process, led by the attending physician.  Recommendations may be updated based on patient status, additional functional criteria and insurance authorization.    Recommendations Consider LTACH PMSV with RT, SLP present  PMSV Supervision: Full           Oral care BID   Frequent or constant Supervision/Assistance Aphonia (R49.1)     Continue with current plan of care    Rolena Infante, MS [facility] SLP Acute Rehab Services Office  [phone]  Chales Abrahams  02/21/2023, 4:37 PM

## 2023-02-21 NOTE — Progress Notes (Signed)
NAME:  [name], MRN:  [id number], DOB:  [birth year], LOS: 40 ADMISSION DATE:  01/11/2023, CONSULTATION DATE:  01/16/2023 REFERRING MD: FMTS, CHIEF COMPLAINT: Acute Respiratory Failure in the setting of Lung Cancer, COPD and infiltrate on CXR   History of Present Illness:  67 yo female former smoker presented to [facility] ER with nausea, vomiting, diarrhea and abdominal pain for 2 weeks.  Found to have RML and RLL post-obstructive pneumonia with Rt hilar mass, new onset A fib.  She required intubation on 5/06 for hypoxic respiratory failure.  Transferred to [facility] on 5/07 for cancer therapy.  Pertinent Medical History:  COPD, Adenocarcinoma LLL November 2021, Bladder cancer, GERD, Anxiety with panic attacks, Depression, Allergies  Significant Hospital Events: Including procedures, antibiotic start and stop dates in addition to other pertinent events   5/6 Transferred to ICU for acute Respiratory Failure , intubated  5/7 Bronch by Icard. significant extrinsic compression of the airways in the right lower lobe and middle lobe. This is likely malignant obstruction. Bx sent. Transferred to [facility] for rad/onc eval. PICC placed 5/8 Stable still high PEEP/FiO2 needs. Rad/onc consult requested  5/9 start radiation therapy 5/10 Tolerating SBT trial this AM on low-dose fentanyl and Precedex.  Propofol DC'd due to high triglycerides 5/11 Tolerates SBT's, pressure support 10/5 5/12 Off of pressors. Remains on vent, sedated with Precedex/Fentanyl. PMT consulted. 5/13 Weaning on PSV 14/8. > 10L positive over the course of admission. Diuresing. 5/14 Weaning on PSV. Ongoing diuresis. 5/15 Tolerating SBT, PSV 5/5, following commands. Continue diuresis, Foley placed for persistent urinary retention. Extubated, decompensated quickly after requiring reintubation. DHT placed. 5/16 Agitated overnight, c/o throat pain with ETT, RASS 1-2 despite sedation. Failed extubation. Tolerating SBT PSV 10/5, FiO2 35%. ~1630 desaturated  with inability to pass [place] suction/difficulty bagging; urgent tube exchange completed with bougie and blood clot removed with improvement in oxygenation/ventilation. 5/17 Tracheostomy placed 5/19 Back in atrial fibrillation in am, HR 150s 5/20 Weaned on pressure support 10/5 5/21 Desaturated during weaning. Still on dex and fent gtt 5/23 added fent patch and seroquel had to have NE gtt and midodrine started for drug related hypotension 5/24 Trialing fent gtt off. Foley removed 5/25 3 hrs. trach collar 5/26 Pressure support minimal trach collar 5/28 SIMV/PS, increased pain. Changed oxy fent patch to dilaudid incr seroquel 5/29 radiation. Off dilaudid gtt. Lower dose precedex requirement  5/30 try to wean precedex. Repeat anxiety during/after rad. Incr anxiolytics 6/01 change to pressure control as rest mode 6/4 trial vent valve and able to phonate, trach collar trial for ~1-2 hours surprisingly and wore PMV for 5 minutes and able to phonate well  6/6 intermittent plugging, underwent bronch but required surprisingly little therapeutic aspiration  6/6-6/7 required 3U pRBC transfusion total without clear source of blood loss. Therapeutic AC has been held in advance of g-tube placement on 6/10, remains on ppx lovenox. 6/10 CT abdomen concerning for new nodular lesions in the soft tissue and retroperitoneal space.   Interim History / Subjective:   Remains on 50% FiO2 with sats in the high 80s and low 90s.  Unable to have G-tube placed yesterday as bowel had migrated anterior to the stomach.  CT abdomen yesterday concerning for new nodular lesions in the abdomen and the soft tissue.   Objective:  Blood pressure 113/60, pulse (!) 112, temperature 98.1 F (36.7 C), temperature source Oral, resp. rate 18, height 5\' 8"  (1.727 m), weight 90.1 kg, SpO2 (!) 89 %.    Vent Mode: PCV  FiO2 (%):  [40 %-60 %] 55 % Set Rate:  [24 bmp] 24 bmp PEEP:  [5 cmH20] 5 cmH20 Plateau Pressure:  [18 cmH20-19  cmH20] 18 cmH20   Intake/Output Summary (Last 24 hours) at 02/21/2023 1058 Last data filed at 02/21/2023 0725 Gross per 24 hour  Intake 2063.37 ml  Output 450 ml  Net 1613.37 ml    Filed Weights   02/19/23 0355 02/20/23 0500 02/21/23 0500  Weight: 89.5 kg 90.1 kg 90.1 kg   Physical Examination: General: Middle aged female on vent via trach Neuro:  alert, oriented, non-focal HEENT:  /AT, PERRL, no JVD Cardiovascular:  RRR, no MRG Lungs:  Rhonchi right base, more clear on the left.  Abdomen:  Soft, non-distended, non-tender. Normoactive.  Musculoskeletal:  No acute deformity or ROM limitation Skin:  Intact, MMM  Resolved Hospital Problem List:  Hypertriglyceridemia due to propofol, Hyponatremia , Hypotension drug induced from sedation. Vocal cord edema, Post obstructive PNA: Zosyn completed 5/13, Hypotension from hypovolemia and sedation  Assessment & Plan:   Acute hypoxic respiratory failure from post-obstructive pneumonia and atelectasis in setting of COPD with emphysema. Failure to wean s/p tracheostomy. Bronchospasm Mucous Plugging - PS wean/trach collar as tolerated; use pressure control as rest mode. In line speaking valve trials per RT/SLP - anxiety, deconditioning, tumor burden limit ability to tolerate vent weaning - continue dexamethasone 6 mg IV daily - would probably continue through the rest of her radiation course unless we're running into issues with agitation/insomnia - goal SpO2 90 to 95% - continue brovana, pulmicort, yupelri - prn albuterol - routine trach care  Rt hilar mass with poorly differentiated NSCLC. Hx of LLL adenocarcinoma from November 2021. - continue radiation therapy per radiation oncology - now with concern for intraabdominal mets, will re-engage oncology  Acute metabolic encephalopathy from sepsis and hypoxia. Hx of depression, anxiety, chronic cancer pain. - precedex for RASS goal 0 to +1. Try to wean some today.  - celexa 20 mg  daily, buspar 20 mg bid - continue klonopin 2 mg BID,continue neurontin 600 mg tid (home med), dilaudid 1 mg q4h, continue remeron 15 mg qhs, continue seroquel 100 mg QHS  Paroxysmal atrial fibrillation. HLD, HTN. - hold eliquis in advance of likely g-tube placement - lipitor, hold norvasc   Anemia of critical illness, iron deficiency, and chronic disease, ICU phlebotomy - continue ferrous sulfate - f/u CBC - hold therapeutic AC as she has required 3U pRBC over course of 6/6-6/7 and will likely pursue g-tube today - Lovenox   Urine retention. - foley DC 6/10 - continue bethanechol  Deconditioning. - PT/OT  Goals of care - revisited on 6/4, continuing current measures including goal to complete planned course of XRT (now 20/30 treatments completed).   Best Practice: (right click and "Reselect all SmartList Selections" daily)   Diet/type: tubefeeds DVT prophylaxis: Eliquis on hold in advance of g tube placement. Continue DVT chemoppx.  GI prophylaxis: H2B Lines: Central line - PICC Foley:  Yes Code Status:  full code Last date of multidisciplinary goals of care discussion: 6/4 - remains full code, wishes to continue XRT treatment  Labs:      Latest Ref Rng & Units 02/21/2023    6:03 AM 02/20/2023    4:13 AM 02/19/2023    3:58 AM  CMP  Glucose 70 - 99 mg/dL 94  098  119   BUN 8 - 23 mg/dL 29  30  28    Creatinine 0.44 - 1.00 mg/dL 1.47  8.29  0.60   Sodium 135 - 145 mmol/L 138  137  138   Potassium 3.5 - 5.1 mmol/L 4.2  4.0  4.5   Chloride 98 - 111 mmol/L 100  99  102   CO2 22 - 32 mmol/L 29  28  29    Calcium 8.9 - 10.3 mg/dL 8.9  8.7  9.1        Latest Ref Rng & Units 02/21/2023    6:03 AM 02/20/2023    4:13 AM 02/19/2023    3:58 AM  CBC  WBC 4.0 - 10.5 K/uL 10.3  9.8  12.5   Hemoglobin 12.0 - 15.0 g/dL 8.0  8.0  9.2   Hematocrit 36.0 - 46.0 % 26.2  25.6  30.2   Platelets 150 - 400 K/uL 398  440  441    ABG    Component Value Date/Time   PHART 7.550 (H)  01/17/2023 0343   PCO2ART 38.4 01/17/2023 0343   PO2ART 53 (L) 01/17/2023 0343   HCO3 29.9 (H) 02/13/2023 0916   TCO2 35 (H) 01/17/2023 0343   O2SAT 60.2 02/13/2023 0916    CBG (last 3)  Recent Labs    02/20/23 0835 02/20/23 1155 02/21/23 0739  GLUCAP 98 83 110*     Critical care time: 36 minutes    Joneen Roach, AGACNP-BC [facility]  See Amion for personal pager PCCM on call pager [phone] until 7pm. Please call Elink 7p-7a. [phone]  02/21/2023 10:58 AM

## 2023-02-21 NOTE — Progress Notes (Signed)
  Echocardiogram 2D Echocardiogram has been performed.  Janalyn Harder 02/21/2023, 8:36 AM

## 2023-02-21 NOTE — Progress Notes (Signed)
OT Cancellation Note  Patient Details Name: [name] MRN: [id number] DOB: Apr 21, 1956   Cancelled Treatment:    Reason Eval/Treat Not Completed: Other (comment) Patient is currently sleeping with recent administration of Versed. OT to continue to follow and check back as schedule will allow.  Rosalio Loud, MS Acute Rehabilitation Department Office# [phone] 02/21/2023, 9:07 AM

## 2023-02-22 ENCOUNTER — Ambulatory Visit
Admission: RE | Admit: 2023-02-22 | Discharge: 2023-02-22 | Disposition: A | Discharge status: Home / Self Care | Payer: 59 | Source: Ambulatory Visit | Attending: Radiation Oncology | Admitting: Radiation Oncology

## 2023-02-22 ENCOUNTER — Other Ambulatory Visit: Payer: Self-pay

## 2023-02-22 DIAGNOSIS — Z87891 Personal history of nicotine dependence: Secondary | ICD-10-CM | POA: Diagnosis not present

## 2023-02-22 DIAGNOSIS — C3432 Malignant neoplasm of lower lobe, left bronchus or lung: Secondary | ICD-10-CM | POA: Diagnosis not present

## 2023-02-22 DIAGNOSIS — R918 Other nonspecific abnormal finding of lung field: Secondary | ICD-10-CM | POA: Diagnosis not present

## 2023-02-22 DIAGNOSIS — Z510 Encounter for antineoplastic radiation therapy: Secondary | ICD-10-CM | POA: Diagnosis not present

## 2023-02-22 DIAGNOSIS — J988 Other specified respiratory disorders: Secondary | ICD-10-CM | POA: Diagnosis not present

## 2023-02-22 DIAGNOSIS — C3431 Malignant neoplasm of lower lobe, right bronchus or lung: Secondary | ICD-10-CM | POA: Diagnosis not present

## 2023-02-22 DIAGNOSIS — J9601 Acute respiratory failure with hypoxia: Secondary | ICD-10-CM | POA: Diagnosis not present

## 2023-02-22 LAB — RAD ONC ARIA SESSION SUMMARY
Course Elapsed Days: 35
Plan Fractions Treated to Date: 22
Plan Prescribed Dose Per Fraction: 2 Gy
Plan Total Fractions Prescribed: 27
Plan Total Prescribed Dose: 54 Gy
Reference Point Dosage Given to Date: 44 Gy
Reference Point Session Dosage Given: 2 Gy
Session Number: 25

## 2023-02-22 LAB — GLUCOSE, CAPILLARY
Glucose-Capillary: 112 mg/dL — ABNORMAL HIGH (ref 70–99)
Glucose-Capillary: 115 mg/dL — ABNORMAL HIGH (ref 70–99)
Glucose-Capillary: 119 mg/dL — ABNORMAL HIGH (ref 70–99)
Glucose-Capillary: 147 mg/dL — ABNORMAL HIGH (ref 70–99)
Glucose-Capillary: 159 mg/dL — ABNORMAL HIGH (ref 70–99)
Glucose-Capillary: 98 mg/dL (ref 70–99)
Glucose-Capillary: 168 mg/dL — ABNORMAL HIGH (ref 70–99)
Glucose-Capillary: 132 mg/dL — ABNORMAL HIGH (ref 70–99)
Glucose-Capillary: 102 mg/dL — ABNORMAL HIGH (ref 70–99)
Glucose-Capillary: 148 mg/dL — ABNORMAL HIGH (ref 70–99)
Glucose-Capillary: 133 mg/dL — ABNORMAL HIGH (ref 70–99)

## 2023-02-22 NOTE — Plan of Care (Signed)
  Problem: Education: Goal: Knowledge of General Education information will improve Description: Including pain rating scale, medication(s)/side effects and non-pharmacologic comfort measures Outcome: Progressing   Problem: Health Behavior/Discharge Planning: Goal: Ability to manage health-related needs will improve Outcome: Progressing   Problem: Clinical Measurements: Goal: Ability to maintain clinical measurements within normal limits will improve Outcome: Progressing Goal: Will remain free from infection Outcome: Progressing Goal: Diagnostic test results will improve Outcome: Progressing Goal: Respiratory complications will improve Outcome: Progressing Goal: Cardiovascular complication will be avoided Outcome: Progressing   Problem: Activity: Goal: Risk for activity intolerance will decrease Outcome: Progressing   Problem: Nutrition: Goal: Adequate nutrition will be maintained Outcome: Progressing   Problem: Coping: Goal: Level of anxiety will decrease Outcome: Progressing   Problem: Elimination: Goal: Will not experience complications related to bowel motility Outcome: Progressing Goal: Will not experience complications related to urinary retention Outcome: Progressing   Problem: Pain Managment: Goal: General experience of comfort will improve Outcome: Progressing   Problem: Safety: Goal: Ability to remain free from injury will improve Outcome: Progressing   Problem: Skin Integrity: Goal: Risk for impaired skin integrity will decrease Outcome: Progressing   Problem: Education: Goal: Ability to describe self-care measures that may prevent or decrease complications (Diabetes Survival Skills Education) will improve Outcome: Progressing Goal: Individualized Educational Video(s) Outcome: Progressing   Problem: Coping: Goal: Ability to adjust to condition or change in health will improve Outcome: Progressing   Problem: Fluid Volume: Goal: Ability to  maintain a balanced intake and output will improve Outcome: Progressing   Problem: Health Behavior/Discharge Planning: Goal: Ability to identify and utilize available resources and services will improve Outcome: Progressing Goal: Ability to manage health-related needs will improve Outcome: Progressing   Problem: Metabolic: Goal: Ability to maintain appropriate glucose levels will improve Outcome: Progressing   Problem: Nutritional: Goal: Maintenance of adequate nutrition will improve Outcome: Progressing Goal: Progress toward achieving an optimal weight will improve Outcome: Progressing   Problem: Skin Integrity: Goal: Risk for impaired skin integrity will decrease Outcome: Progressing   Problem: Tissue Perfusion: Goal: Adequacy of tissue perfusion will improve Outcome: Progressing   Problem: Safety: Goal: Non-violent Restraint(s) Outcome: Progressing   Problem: Activity: Goal: Ability to tolerate increased activity will improve Outcome: Progressing   Problem: Respiratory: Goal: Ability to maintain a clear airway and adequate ventilation will improve Outcome: Progressing   Problem: Role Relationship: Goal: Method of communication will improve Outcome: Progressing   Problem: Education: Goal: Knowledge about tracheostomy care/management will improve Outcome: Progressing   Problem: Activity: Goal: Ability to tolerate increased activity will improve Outcome: Progressing   Problem: Health Behavior/Discharge Planning: Goal: Ability to manage tracheostomy will improve Outcome: Progressing   Problem: Respiratory: Goal: Patent airway maintenance will improve Outcome: Progressing   Problem: Role Relationship: Goal: Ability to communicate will improve Outcome: Progressing

## 2023-02-22 NOTE — Progress Notes (Signed)
Physical Therapy Treatment Patient Details Name: [name] MRN: [id number] DOB: 1956-03-07 Today's Date: 02/22/2023   History of Present Illness 67 year old woman who presented to [facility] 5/1 for nausea/vomiting/diarrhea and abdominal pain x 2 weeks. PMHx significant for COPD, lung CA (adenocarcinoma of LLL, biopsy planned for 5/7), bladder CA (s/p TURBT), GERD, and panic/anxiety. Admitted for AHRF in setting of CAP/COPD exacerbation to FMTS at [facility].    PT Comments    Pt was able to stand with [facility] lift equipment for ~45 seconds. Mod assist of 2 for sit to stand from elevated bed. RN present and managed ventilator.    Recommendations for follow up therapy are one component of a multi-disciplinary discharge planning process, led by the attending physician.  Recommendations may be updated based on patient status, additional functional criteria and insurance authorization.  Follow Up Recommendations  Can patient physically be transported by private vehicle: No    Assistance Recommended at Discharge Frequent or constant Supervision/Assistance  Patient can return home with the following Assist for transportation;Help with stairs or ramp for entrance;Assistance with cooking/housework;Two people to help with walking and/or transfers;Two people to help with bathing/dressing/bathroom   Equipment Recommendations  None recommended by PT    Recommendations for Other Services       Precautions / Restrictions Precautions Precautions: Fall Precaution Comments: watch Spo2 , HR, RR, trach,, Cor trac, loose BM's Restrictions Weight Bearing Restrictions: No     Mobility  Bed Mobility Overal bed mobility: Needs Assistance Bed Mobility: Supine to Sit     Supine to sit: HOB elevated, Mod assist, +2 for physical assistance     General bed mobility comments: assist to pivot hips to edge of bed and to raise trunk    Transfers Overall transfer level: Needs assistance     Sit to Stand: Mod  assist, From elevated surface, +2 physical assistance           General transfer comment: assist to power up using Stedy from elevated bed. Pt able to stand for ~45 seconds for pivot to recliner Transfer via Lift Equipment: Stedy  Ambulation/Gait                   Stairs             Wheelchair Mobility    Modified Rankin (Stroke Patients Only)       Balance Overall balance assessment: Needs assistance Sitting-balance support: No upper extremity supported, Feet supported Sitting balance-Leahy Scale: Good     Standing balance support: During functional activity, Bilateral upper extremity supported, Reliant on assistive device for balance Standing balance-Leahy Scale: Poor                              Cognition Arousal/Alertness: Awake/alert Behavior During Therapy: WFL for tasks assessed/performed Overall Cognitive Status: Within Functional Limits for tasks assessed                                 General Comments: remains on VENT, attempts to mouth words. Will nod/shake head some with "yes/no" questions.  Good eye contact and engagment.        Exercises General Exercises - Lower Extremity Ankle Circles/Pumps: AAROM, Both, 15 reps, Supine    General Comments        Pertinent Vitals/Pain Pain Assessment Facial Expression: Tense Body Movements: Protection Muscle Tension: Tense, rigid Compliance with  ventilator (intubated pts.): Coughing but tolerating Vocalization (extubated pts.): N/A CPOT Total: 4    Home Living                          Prior Function            PT Goals (current goals can now be found in the care plan section) Acute Rehab PT Goals Patient Stated Goal: unable PT Goal Formulation: Patient unable to participate in goal setting Time For Goal Achievement: 03/08/23 Potential to Achieve Goals: Fair Progress towards PT goals: Progressing toward goals    Frequency    Min  1X/week      PT Plan Current plan remains appropriate    Co-evaluation   Reason for Co-Treatment: Complexity of the patient's impairments (multi-system involvement);For patient/therapist safety;To address functional/ADL transfers PT goals addressed during session: Mobility/safety with mobility;Proper use of DME        AM-PAC PT "6 Clicks" Mobility   Outcome Measure  Help needed turning from your back to your side while in a flat bed without using bedrails?: A Lot Help needed moving from lying on your back to sitting on the side of a flat bed without using bedrails?: A Lot Help needed moving to and from a bed to a chair (including a wheelchair)?: Total Help needed standing up from a chair using your arms (e.g., wheelchair or bedside chair)?: A Lot Help needed to walk in hospital room?: Total Help needed climbing 3-5 steps with a railing? : Total 6 Click Score: 9    End of Session   Activity Tolerance: Patient limited by fatigue Patient left: in chair;with call bell/phone within reach;with nursing/sitter in room Nurse Communication: Mobility status;Need for lift equipment PT Visit Diagnosis: Other abnormalities of gait and mobility (R26.89);Muscle weakness (generalized) (M62.81)     Time: 1610-9604 PT Time Calculation (min) (ACUTE ONLY): 21 min  Charges:  $Therapeutic Activity: 8-22 mins                     Ralene Bathe Kistler PT 02/22/2023  Acute Rehabilitation Services  Office [phone]

## 2023-02-22 NOTE — Progress Notes (Signed)
Occupational Therapy Treatment Patient Details Name: [name] MRN: [id number] DOB: 03-05-1956 Today's Date: 02/22/2023   History of present illness 67 year old woman who presented to [facility] 5/1 for nausea/vomiting/diarrhea and abdominal pain x 2 weeks. PMHx significant for COPD, lung CA (adenocarcinoma of LLL, biopsy planned for 5/7), bladder CA (s/p TURBT), GERD, and panic/anxiety. Admitted for AHRF in setting of CAP/COPD exacerbation to FMTS at [facility].   OT comments  Pt progressing towards goals, needing set up - mod A for ADLs, pt with n/v during session, RN present. Pt needing mod A +2 for bed mobility and transfer to chair with use of Stedy. SpO2 down to 86% at lowest, 50% FiO2, PEEP 5. Pt with increased anxiety during session, increased time needed for all aspects of mobility. Pt presenting with impairments listed below, will follow acutely. Continue to recommend OT at [facility] at d/c.    Recommendations for follow up therapy are one component of a multi-disciplinary discharge planning process, led by the attending physician.  Recommendations may be updated based on patient status, additional functional criteria and insurance authorization.    Assistance Recommended at Discharge Frequent or constant Supervision/Assistance  Patient can return home with the following  Two people to help with bathing/dressing/bathroom;Two people to help with walking and/or transfers;Assistance with cooking/housework;Assist for transportation;Help with stairs or ramp for entrance;Direct supervision/assist for financial management   Equipment Recommendations  Tub/shower seat    Recommendations for Other Services PT consult    Precautions / Restrictions Precautions Precautions: Fall Precaution Comments: watch Spo2 , HR, RR, trach,, Cor trac, loose BM's Restrictions Weight Bearing Restrictions: No       Mobility Bed Mobility Overal bed mobility: Needs Assistance Bed Mobility: Supine to Sit     Supine  to sit: HOB elevated, Mod assist, +2 for physical assistance     General bed mobility comments: assist to pivot hips to edge of bed and to raise trunk    Transfers Overall transfer level: Needs assistance     Sit to Stand: Mod assist, From elevated surface, +2 physical assistance             Transfer via Lift Equipment: Stedy   Balance Overall balance assessment: Needs assistance Sitting-balance support: No upper extremity supported, Feet supported Sitting balance-Leahy Scale: Good     Standing balance support: During functional activity, Bilateral upper extremity supported, Reliant on assistive device for balance Standing balance-Leahy Scale: Poor                             ADL either performed or assessed with clinical judgement   ADL Overall ADL's : Needs assistance/impaired Eating/Feeding: NPO   Grooming: Set up;Sitting;Wash/dry face           Upper Body Dressing : Moderate assistance Upper Body Dressing Details (indicate cue type and reason): mod A for lines     Toilet Transfer: Moderate assistance;+2 for physical assistance           Functional mobility during ADLs: Moderate assistance;+2 for physical assistance      Extremity/Trunk Assessment Upper Extremity Assessment Upper Extremity Assessment: Generalized weakness   Lower Extremity Assessment Lower Extremity Assessment: Defer to PT evaluation        Vision   Vision Assessment?: No apparent visual deficits   Perception Perception Perception: Not tested   Praxis Praxis Praxis: Not tested    Cognition Arousal/Alertness: Awake/alert Behavior During Therapy: WFL for tasks assessed/performed Overall Cognitive Status:  Within Functional Limits for tasks assessed                                 General Comments: able to mouth words, gesture, and nod yes/no to questions asked; follows commands appropriately        Exercises      Shoulder Instructions        General Comments 50% FiO2, 5 PEEP    Pertinent Vitals/ Pain       Pain Assessment Pain Assessment: No/denies pain  Home Living                                          Prior Functioning/Environment              Frequency  Min 1X/week        Progress Toward Goals  OT Goals(current goals can now be found in the care plan section)  Progress towards OT goals: Progressing toward goals  Acute Rehab OT Goals Patient Stated Goal: none stated OT Goal Formulation: With patient Time For Goal Achievement: 03/08/23 Potential to Achieve Goals: Fair ADL Goals Pt Will Perform Upper Body Bathing: with min assist;sitting;bed level Pt Will Perform Lower Body Bathing: with min assist;sit to/from stand;sitting/lateral leans Pt Will Transfer to Toilet: with min assist;squat pivot transfer;stand pivot transfer;bedside commode Additional ADL Goal #3: pt will perform bed mobility min A in prep for ADLs  Plan Discharge plan remains appropriate;Frequency remains appropriate    Co-evaluation    PT/OT/SLP Co-Evaluation/Treatment: Yes Reason for Co-Treatment: Complexity of the patient's impairments (multi-system involvement);For patient/therapist safety;To address functional/ADL transfers PT goals addressed during session: Mobility/safety with mobility;Proper use of DME OT goals addressed during session: ADL's and self-care      AM-PAC OT "6 Clicks" Daily Activity     Outcome Measure   Help from another person eating meals?: Total Help from another person taking care of personal grooming?: A Lot Help from another person toileting, which includes using toliet, bedpan, or urinal?: A Lot Help from another person bathing (including washing, rinsing, drying)?: A Lot Help from another person to put on and taking off regular upper body clothing?: A Lot Help from another person to put on and taking off regular lower body clothing?: A Lot 6 Click Score: 11    End of  Session Equipment Utilized During Treatment: Oxygen;Other (comment) (stedy)  OT Visit Diagnosis: Other abnormalities of gait and mobility (R26.89);Muscle weakness (generalized) (M62.81)   Activity Tolerance Patient tolerated treatment well   Patient Left in chair;with call bell/phone within reach;with nursing/sitter in room   Nurse Communication Mobility status        Time: 8295-6213 OT Time Calculation (min): 23 min  Charges: OT General Charges $OT Visit: 1 Visit OT Treatments $Self Care/Home Management : 8-22 mins  Carver Fila, OTD, OTR/L SecureChat Preferred Acute Rehab [phone]   Carver Fila Koonce 02/22/2023, 2:48 PM

## 2023-02-22 NOTE — Progress Notes (Addendum)
NAME:  [name], MRN:  [id number], DOB:  [birth year], LOS: 41 ADMISSION DATE:  01/11/2023, CONSULTATION DATE:  01/16/2023 REFERRING MD: FMTS, CHIEF COMPLAINT: Acute Respiratory Failure in the setting of Lung Cancer, COPD and infiltrate on CXR   History of Present Illness:  67 yo female former smoker presented to [facility] ER with nausea, vomiting, diarrhea and abdominal pain for 2 weeks.  Found to have RML and RLL post-obstructive pneumonia with Rt hilar mass, new onset A fib.  She required intubation on 5/06 for hypoxic respiratory failure.  Transferred to [facility] on 5/07 for cancer therapy.  Pertinent Medical History:  COPD, Adenocarcinoma LLL November 2021, Bladder cancer, GERD, Anxiety with panic attacks, Depression, Allergies  Significant Hospital Events: Including procedures, antibiotic start and stop dates in addition to other pertinent events   5/6 Transferred to ICU for acute Respiratory Failure , intubated  5/7 Bronch by Icard. significant extrinsic compression of the airways in the right lower lobe and middle lobe. This is likely malignant obstruction. Bx sent. Transferred to [facility] for rad/onc eval. PICC placed 5/8 Stable still high PEEP/FiO2 needs. Rad/onc consult requested  5/9 start radiation therapy 5/10 Tolerating SBT trial this AM on low-dose fentanyl and Precedex.  Propofol DC'd due to high triglycerides 5/11 Tolerates SBT's, pressure support 10/5 5/12 Off of pressors. Remains on vent, sedated with Precedex/Fentanyl. PMT consulted. 5/13 Weaning on PSV 14/8. > 10L positive over the course of admission. Diuresing. 5/14 Weaning on PSV. Ongoing diuresis. 5/15 Tolerating SBT, PSV 5/5, following commands. Continue diuresis, Foley placed for persistent urinary retention. Extubated, decompensated quickly after requiring reintubation. DHT placed. 5/16 Agitated overnight, c/o throat pain with ETT, RASS 1-2 despite sedation. Failed extubation. Tolerating SBT PSV 10/5, FiO2 35%. ~1630 desaturated  with inability to pass [place] suction/difficulty bagging; urgent tube exchange completed with bougie and blood clot removed with improvement in oxygenation/ventilation. 5/17 Tracheostomy placed 5/19 Back in atrial fibrillation in am, HR 150s 5/20 Weaned on pressure support 10/5 5/21 Desaturated during weaning. Still on dex and fent gtt 5/23 added fent patch and seroquel had to have NE gtt and midodrine started for drug related hypotension 5/24 Trialing fent gtt off. Foley removed 5/25 3 hrs. trach collar 5/26 Pressure support minimal trach collar 5/28 SIMV/PS, increased pain. Changed oxy fent patch to dilaudid incr seroquel 5/29 radiation. Off dilaudid gtt. Lower dose precedex requirement  5/30 try to wean precedex. Repeat anxiety during/after rad. Incr anxiolytics 6/01 change to pressure control as rest mode 6/4 trial vent valve and able to phonate, trach collar trial for ~1-2 hours surprisingly and wore PMV for 5 minutes and able to phonate well  6/6 intermittent plugging, underwent bronch but required surprisingly little therapeutic aspiration  6/6-6/7 required 3U pRBC transfusion total without clear source of blood loss. Therapeutic AC has been held in advance of g-tube placement on 6/10, remains on ppx lovenox. 6/10 CT abdomen concerning for new nodular lesions in the soft tissue and retroperitoneal space.   Interim History / Subjective:  Tolerated PMV for ~1 hour yesterday then became anxious and placed back on vent. This AM is still on vent. Nods head "no" when asked if any concerns.    Objective:  Blood pressure (!) 107/57, pulse 94, temperature 97.6 F (36.4 C), temperature source Oral, resp. rate 16, height 5\' 8"  (1.727 m), weight 91.1 kg, SpO2 93 %.    Vent Mode: PCV FiO2 (%):  [45 %-50 %] 50 % Set Rate:  [24 bmp] 24 bmp PEEP:  [  5 cmH20] 5 cmH20 Pressure Support:  [12 cmH20] 12 cmH20 Plateau Pressure:  [18 cmH20-26 cmH20] 26 cmH20   Intake/Output Summary (Last 24 hours)  at 02/22/2023 0842 Last data filed at 02/22/2023 7564 Gross per 24 hour  Intake 2693.46 ml  Output 975 ml  Net 1718.46 ml    Filed Weights   02/20/23 0500 02/21/23 0500 02/22/23 0500  Weight: 90.1 kg 90.1 kg 91.1 kg   Physical Examination: General: Middle aged female on vent via trach Neuro:  alert, oriented, non-focal HEENT:  Trach C/D/I. [place]/AT. MMM Cardiovascular:  RRR, no MRG Lungs:  Rhonchi bilateral bases Abdomen:  BS x 4. Soft, non-distended, non-tender Musculoskeletal:  No acute deformity, no edema Skin:  Intact, no rashes  Resolved Hospital Problem List:  Hypertriglyceridemia due to propofol, Hyponatremia , Hypotension drug induced from sedation. Vocal cord edema, Post obstructive PNA: Zosyn completed 5/13, Hypotension from hypovolemia and sedation  Assessment & Plan:   Acute hypoxic respiratory failure from post-obstructive pneumonia and atelectasis in setting of COPD with emphysema. Failure to wean s/p tracheostomy. Bronchospasm Mucous Plugging - PS wean/trach collar as tolerated; use pressure control as rest mode. - In line speaking valve trials per RT/SLP - TOC asked to assist with starting placement inquiries as liberating from vent might be an issue - anxiety, deconditioning, tumor burden limit ability to tolerate vent weaning - continue brovana, pulmicort, yupelri - prn albuterol - routine trach care  Rt hilar mass with poorly differentiated NSCLC. Hx of LLL adenocarcinoma from November 2021. - continue radiation therapy per radiation oncology. She is s/p 5 days of dexamethasone 6 mg IV, question whether this should continue? (will defer to oncology) - now with concern for intraabdominal mets, oncology aware. Pt requests to complete radiation then discuss further plans with oncology  Acute metabolic encephalopathy from sepsis and hypoxia. Hx of depression, anxiety, chronic cancer pain. - precedex for RASS goal 0 to +1. Try to wean some today but has been  challenging - continue celexa 20 mg daily, buspar 20 mg bid, klonopin 2 mg BID,continue neurontin 600 mg tid (home med), dilaudid 1 mg q4h, continue remeron 15 mg qhs, continue seroquel 100 mg QHS  Paroxysmal atrial fibrillation. HLD, HTN. - hold eliquis in advance of likely g-tube placement (hopefully today) - lipitor, hold norvasc   Anemia of critical illness, iron deficiency, and chronic disease, ICU phlebotomy - continue ferrous sulfate - limit labs unless necessary - hold therapeutic AC as she has required 3U pRBC over course of 6/6-6/7 and will hopefully pursue g-tube today - Lovenox   Urine retention - foley DC 6/10 - continue bethanechol  Deconditioning. - PT/OT  Goals of care - revisited on 6/4, continuing current measures including goal to complete planned course of XRT (now 20/30 treatments completed) - Will place new palliative care consult to get them re-engaged to assist with future plans  Best Practice: (right click and "Reselect all SmartList Selections" daily)   Diet/type: tubefeeds DVT prophylaxis: Eliquis on hold in advance of g tube placement. Continue DVT chemoppx.  GI prophylaxis: H2B Lines: Central line - PICC Foley:  Yes Code Status:  full code Last date of multidisciplinary goals of care discussion: 6/4 - remains full code, wishes to continue XRT treatment   Critical care time: 35 minutes   Sahas Sluka Celine Mans, [place] - [facility] For pager details, please see AMION or use Epic chat  After 1900, please call [facility] for cross coverage needs 02/22/2023, 9:05 AM

## 2023-02-23 ENCOUNTER — Ambulatory Visit
Admission: RE | Admit: 2023-02-23 | Discharge: 2023-02-23 | Disposition: A | Discharge status: Home / Self Care | Payer: 59 | Source: Ambulatory Visit | Attending: Radiation Oncology | Admitting: Radiation Oncology

## 2023-02-23 ENCOUNTER — Other Ambulatory Visit: Payer: Self-pay

## 2023-02-23 ENCOUNTER — Ambulatory Visit: Payer: 59

## 2023-02-23 DIAGNOSIS — J988 Other specified respiratory disorders: Secondary | ICD-10-CM | POA: Diagnosis not present

## 2023-02-23 DIAGNOSIS — Z510 Encounter for antineoplastic radiation therapy: Secondary | ICD-10-CM | POA: Diagnosis not present

## 2023-02-23 DIAGNOSIS — Z87891 Personal history of nicotine dependence: Secondary | ICD-10-CM | POA: Diagnosis not present

## 2023-02-23 DIAGNOSIS — J9601 Acute respiratory failure with hypoxia: Secondary | ICD-10-CM | POA: Diagnosis not present

## 2023-02-23 DIAGNOSIS — C3431 Malignant neoplasm of lower lobe, right bronchus or lung: Secondary | ICD-10-CM | POA: Diagnosis not present

## 2023-02-23 DIAGNOSIS — C3432 Malignant neoplasm of lower lobe, left bronchus or lung: Secondary | ICD-10-CM | POA: Diagnosis not present

## 2023-02-23 DIAGNOSIS — R918 Other nonspecific abnormal finding of lung field: Secondary | ICD-10-CM | POA: Diagnosis not present

## 2023-02-23 LAB — RAD ONC ARIA SESSION SUMMARY
Course Elapsed Days: 36
Plan Fractions Treated to Date: 23
Plan Prescribed Dose Per Fraction: 2 Gy
Plan Total Fractions Prescribed: 27
Plan Total Prescribed Dose: 54 Gy
Reference Point Dosage Given to Date: 46 Gy
Reference Point Session Dosage Given: 2 Gy
Session Number: 26

## 2023-02-23 LAB — GLUCOSE, CAPILLARY
Glucose-Capillary: 111 mg/dL — ABNORMAL HIGH (ref 70–99)
Glucose-Capillary: 93 mg/dL (ref 70–99)

## 2023-02-23 MED ORDER — PANTOPRAZOLE SODIUM 40 MG IV SOLR
40.0000 mg | Freq: Every day | INTRAVENOUS | Status: DC
  Administered 2023-02-23 – 2023-03-05 (×11): 40 mg via INTRAVENOUS
  Filled 2023-02-23 (×11): qty 10

## 2023-02-23 MED ORDER — NOREPINEPHRINE 4 MG/250ML-% IV SOLN
0.0000 ug/min | INTRAVENOUS | Status: DC
  Administered 2023-02-24: 2 ug/min via INTRAVENOUS
  Filled 2023-02-23: qty 250

## 2023-02-23 NOTE — Progress Notes (Signed)
NAME:  [name], MRN:  [id number], DOB:  17-Jun-1956, LOS: 42 ADMISSION DATE:  01/11/2023, CONSULTATION DATE:  01/16/2023 REFERRING MD: FMTS, CHIEF COMPLAINT: Acute Respiratory Failure in the setting of Lung Cancer, COPD and infiltrate on CXR   History of Present Illness:  67 year old woman, former smoker who presented to [facility] ED 5/1 with nausea, vomiting, diarrhea and abdominal pain for 2 weeks.  Found to have RML and RLL post-obstructive pneumonia with Rt hilar mass, new onset A fib.  She required intubation on 5/06 for hypoxic respiratory failure.  Transferred to [facility] on 5/07 for cancer therapy.  Pertinent Medical History:  COPD, Adenocarcinoma LLL November 2021, Bladder cancer, GERD, Anxiety with panic attacks, Depression, Allergies  Significant Hospital Events: Including procedures, antibiotic start and stop dates in addition to other pertinent events   5/6 Transferred to ICU for acute Respiratory Failure , intubated  5/7 Bronch by Icard. significant extrinsic compression of the airways in the right lower lobe and middle lobe. This is likely malignant obstruction. Bx sent. Transferred to [facility] for rad/onc eval. PICC placed 5/8 Stable still high PEEP/FiO2 needs. Rad/onc consult requested  5/9 start radiation therapy 5/10 Tolerating SBT trial this AM on low-dose fentanyl and Precedex.  Propofol DC'd due to high triglycerides 5/11 Tolerates SBT's, pressure support 10/5 5/12 Off of pressors. Remains on vent, sedated with Precedex/Fentanyl. PMT consulted. 5/13 Weaning on PSV 14/8. > 10L positive over the course of admission. Diuresing. 5/14 Weaning on PSV. Ongoing diuresis. 5/15 Tolerating SBT, PSV 5/5, following commands. Continue diuresis, Foley placed for persistent urinary retention. Extubated, decompensated quickly after requiring reintubation. DHT placed. 5/16 Agitated overnight, c/o throat pain with ETT, RASS 1-2 despite sedation. Failed extubation. Tolerating SBT PSV 10/5, FiO2 35%.  ~1630 desaturated with inability to pass [place] suction/difficulty bagging; urgent tube exchange completed with bougie and blood clot removed with improvement in oxygenation/ventilation. 5/17 Tracheostomy placed 5/19 Back in atrial fibrillation in am, HR 150s 5/20 Weaned on pressure support 10/5 5/21 Desaturated during weaning. Still on dex and fent gtt 5/23 added fent patch and seroquel had to have NE gtt and midodrine started for drug related hypotension 5/24 Trialing fent gtt off. Foley removed 5/25 3 hrs. trach collar 5/26 Pressure support minimal trach collar 5/28 SIMV/PS, increased pain. Changed oxy fent patch to dilaudid incr seroquel 5/29 radiation. Off dilaudid gtt. Lower dose precedex requirement  5/30 try to wean precedex. Repeat anxiety during/after rad. Incr anxiolytics 6/01 change to pressure control as rest mode 6/4 trial vent valve and able to phonate, trach collar trial for ~1-2 hours surprisingly and wore PMV for 5 minutes and able to phonate well  6/6 intermittent plugging, underwent bronch but required surprisingly little therapeutic aspiration  6/6-6/7 required 3U pRBC transfusion total without clear source of blood loss. Therapeutic AC has been held in advance of g-tube placement on 6/10, remains on ppx lovenox. 6/10 CT abdomen concerning for new nodular lesions in the soft tissue and retroperitoneal space. 6/12 Tolerated TCT with PMV in place x 1 hour, time-limited by anxiety.  Interim History / Subjective:  Looks well overall this morning In good spirits, c/o mild reflux/bloating Intermittent anxiety ongoing PPI added XRT planned for today Will attempt TCT/PMV with SLP later today  Objective:  Blood pressure 116/65, pulse 95, temperature 99.1 F (37.3 C), temperature source Axillary, resp. rate (!) 23, height 5\' 8"  (1.727 m), weight 92.9 kg, SpO2 98 %.    Vent Mode: PCV FiO2 (%):  [50 %-60 %] 50 %  Set Rate:  [24 bmp] 24 bmp PEEP:  [5 cmH20] 5 cmH20 Plateau  Pressure:  [18 cmH20-25 cmH20] 18 cmH20   Intake/Output Summary (Last 24 hours) at 02/23/2023 0757 Last data filed at 02/23/2023 1610 Gross per 24 hour  Intake 1786.4 ml  Output 600 ml  Net 1186.4 ml    Filed Weights   02/21/23 0500 02/22/23 0500 02/23/23 0500  Weight: 90.1 kg 91.1 kg 92.9 kg   Physical Examination: General: Acute-on-chronically ill-appearing middle-aged woman in NAD. HEENT: [place]/AT, anicteric sclera, PERRL, moist mucous membranes. Neck: Tracheostomy in place without significant secretions, drainage. Neuro: Awake, oriented x 4. Mouthing answers to questions appropriately. Responds to verbal stimuli. Following commands consistently. Moves all 4 extremities spontaneously. Generalized weakness. CV: Tachycardic, regular rhythm, no m/g/r. PULM: Breathing even and unlabored on vent (PCV with PEEP 5, FiO2 50%). Lung fields with scattered rhonchi, R > L. GI: Soft, mild-moderate distention, mild diffuse TTP, hypoactive bowel sounds. Extremities: Bilateral trace symmetric LE edema noted. Skin: Warm/dry, no rashes.  Resolved Hospital Problem List:  Hypertriglyceridemia due to propofol, Hyponatremia , Hypotension drug induced from sedation. Vocal cord edema, Post obstructive PNA: Zosyn completed 5/13, Hypotension from hypovolemia and sedation  Assessment & Plan:   Acute hypoxic respiratory failure from post-obstructive pneumonia and atelectasis in setting of COPD with emphysema Failure to wean s/p tracheostomy Bronchospasm Mucous Plugging - Vent support with PCV as rest mode - Continue TCT as tolerated +/- PMV use - Wean FiO2 for O2 sat > 90% - Bronchodilators (triple therapy with Roxy Manns, Pulmicort), albuterol PRN - VAP bundle - Pulmonary hygiene - Trach care per protocol - TOC following for placement, multiple barriers to vent liberation at present (anxiety, deconditioning, tumor burden)  Rt hilar mass with poorly differentiated NSCLC Hx of LLL adenocarcinoma  from November 2021 - Ongoing XRT per Rad Onc, due to complete next week - S/p Decadron course, defer to Onc re: continuation - CT Abdomen 6/10 with c/f intraabdominal mets, Onc aware - Patient wishes to continue radiation then further discuss trajectory; per Dr. Arbutus Ped would need to make significant progress/recover from respiratory failure before she would be a candidate for chemotherapy  Acute metabolic encephalopathy from sepsis and hypoxia Hx of depression, anxiety, chronic cancer pain Anxiety remains a significant barrier to ventilator weaning/overall progress. - PAD protocol for sedation: Precedex for goal RASS  0 to +1 - Continue Celexa 20mg  daily, Buspar 20mg  BID, Klonopin 2mg  BID - Continue gabapentin 600mg  TID (PTA medication), Dilaudid 1mg  Q4H - Remeron 15mg  QHS, Seroquel 100mg  QHS  Paroxysmal atrial fibrillation HLD, HTN - Cardiac monitoring - Optimize electrolytes for K > 4, Mg > 2 - Lovenox for AC while anticipating eventual PEG placement - Continue statin - Hold Norvasc   Anemia of critical illness, iron deficiency, and chronic disease, ICU phlebotomy Required 3U PRBCs 6/6-6/7, holding therapeutic AC. - Trend H&H on scheduled labs, do not obtain unless necessary - Monitor for signs of active bleeding - Transfuse for Hgb < 7.0 or hemodynamically significant bleeding - Remains on Lovenox, Eliquis held - Continue iron supplementation   Urine retention - foley DC 6/10 - Continue bethanechol  Physical deconditioning - PT/OT/SLP as able - TOC engaged for placement options  Goals of care Revisited on 6/4, continuing current measures including goal to complete planned course of XRT (now 20/30 treatments completed) - PMT has been consulted  Best Practice: (right click and "Reselect all SmartList Selections" daily)   Diet/type: tubefeeds DVT prophylaxis: Lovenox, hold Eliquis GI  prophylaxis: H2B Lines: Central line - PICC Foley:  Yes Code Status:  full code Last  date of multidisciplinary goals of care discussion: 6/4 - remains full code, wishes to continue XRT treatment  Critical care time:   The patient is critically ill with multiple organ system failure and requires high complexity decision making for assessment and support, frequent evaluation and titration of therapies, advanced monitoring, review of radiographic studies and interpretation of complex data.   Critical Care Time devoted to patient care services, exclusive of separately billable procedures, described in this note is 36 minutes.  Tim Lair, PA-C [facility] 02/23/23 8:11 AM  Please see [email or web address] for pager details.  From 7A-7P if no response, please call [phone] After hours, please call ELink [phone]

## 2023-02-23 NOTE — TOC Progression Note (Addendum)
Transition of Care [facility]) - Progression Note    Patient Details  Name: [name] MRN: [id number] Date of Birth: Dec 17, 1955  Transition of [facility]) CM/SW Contact  Lavenia Atlas, RN Phone Number: 02/23/2023, 2:19 PM  Clinical Narrative:   Per chart review patient continues to receive radiation therapy, likely LTAC placement as next week radiation therapy will end and patient to remain on vent. This RNCM has offered choice for LTAC, post radiation therapy. [name] [name] has Sports coach for [employer] Victorino Dike w/[facility] has been notified and will review for LTAC placement.      TOC will continue to follow for needs.     Expected Discharge Plan: Long Term Acute Care (LTAC) Barriers to Discharge: Continued Medical Work up  Expected Discharge Plan and Services In-house Referral: Chaplain, Hospice / Palliative Care Discharge Planning Services: CM Consult Post Acute Care Choice: Long Term Acute Care (LTAC) Living arrangements for the past 2 months: Apartment Expected Discharge Date: 01/14/23               DME Arranged: N/A DME Agency: NA       HH Arranged: NA HH Agency: NA         Social Determinants of Health (SDOH) Interventions SDOH Screenings   Food Insecurity: No Food Insecurity (01/13/2023)  Housing: Low Risk  (01/13/2023)  Transportation Needs: No Transportation Needs (01/13/2023)  Utilities: Not At Risk (01/13/2023)  Alcohol Screen: Low Risk  (12/13/2022)  Depression (PHQ2-9): Low Risk  (12/13/2022)  Financial Resource Strain: Low Risk  (12/13/2022)  Physical Activity: Inactive (12/13/2022)  Social Connections: Moderately Integrated (12/13/2022)  Stress: No Stress Concern Present (12/13/2022)  Tobacco Use: Medium Risk (02/20/2023)    Readmission Risk Interventions    02/07/2023    2:14 PM 02/01/2023   12:07 PM  Readmission Risk Prevention Plan  Transportation Screening Complete Complete  PCP or Specialist Appt within 3-5 Days  Complete  HRI or  Home Care Consult  Complete  Social Work Consult for Recovery Care Planning/Counseling  Complete  Palliative Care Screening  Complete  Medication Review Oceanographer) Complete Complete  PCP or Specialist appointment within 3-5 days of discharge Complete   HRI or Home Care Consult Complete   SW Recovery Care/Counseling Consult Complete   Palliative Care Screening Complete   Skilled Nursing Facility Complete

## 2023-02-23 NOTE — Progress Notes (Signed)
Speech Language Pathology Treatment: [name] Speaking valve  Patient Details Name: [name] MRN: [id number] DOB: 1955/12/16 Today's Date: 02/23/2023 Time: 4098-1191 SLP Time Calculation (min) (ACUTE ONLY): 30 min  Assessment / Plan / Recommendation Clinical Impression  Today's session focused on use of PMSV on the vent to allow pt to speak to her daughter, [name], and grandson, Waldron Labs. RN advises pt required pain and anti-anxiety medications today and having some issues with nausea. Misty Stanley, RT, assisted with PMSV usage on vent today and suctioned pt prior to cuff deflation - Upon cuff deflation, pt with coughing that she was able to recover - RT adapated vent to allow pt to remain at near baseline tidal volumes for phonation. Pt continues to demonstrate excellent coordination of breathing and phonation today - She demonstrated increased HR rather rapidly however during valve use - Baseline 115, O2sats 90, RR 26. HR varied from 124-126, oxygen saturation 90-94 and RR 21-31. Pt admitted it was harder today to tolerate the valve than during prior trial 2 days ago. She reported nausea and coughed and expectorated secretions x2 during session, at end of trial, she stated she felt she may vomit - thus trial was discontinued to prevent overt aspiration if pt did vomit. Today's trial was approx 20 minutes long. Daughter and grandson present and able to talk with pt. Daughter, [name], uploaded communication app for pt and SLP adapted it to remove food options given pt is npo x ice. Pt demonstrated adequate usage of app given specific situations. Tactile sensitivity was adapted by her grandson for ease of use. SLP left to make sign re: AAC program for pt and posted in room. Pt hesitant to leave phone with her but reported she would be comfortabel trying it tonight. If she did not find if helpful, [name] will take it home tomorrow per plan reviewed. Recommend continue PMSV trials with RT/SLP to help pt with  communication and to faciliate potential weaning. Of note, single ice chips provided to pt for comfort due to xerostomia.     HPI  Patient is a 67 y.o. female with PMH: COPD, recurrent adenocarcinoma of LLL, anxiety, GERD, epigastric pain, depression. She presented to the hospital on 01/11/2023 for nausea/vomiting/diarrhea and abdominal pain x 2 weeks. She was admitted for AHRF in setting of CAP/COPD exacerbation. She was found to have post-obstructive PNA (RML/RLL) and right hilar mass with progressive respiratory failure requiring intubation 5/6. In addition, she experienced panic/anxiety attacks leading to exacerbated WOB and VCD with associated stridor. On 5/15 she was extubated but quickly decomensated and was reintubated. Tracheostomy placed 5/17. She tolerated 3 hours of trach collar on 5/25. Pt has been having anxiety and thus has not tolerated vent weaning. Pt previously had used valve on the vent approx 20 minutes and then valve on trach collar for 5 minutes on Tuesday *6/4. Further trials indicated as pt continues mostly on full vent support.       SLP Plan  Continue with current plan of care      Recommendations for follow up therapy are one component of a multi-disciplinary discharge planning process, led by the attending physician.  Recommendations may be updated based on patient status, additional functional criteria and insurance authorization.    Recommendations   Continue trials      Patient may use Passy-Muir Speech Valve: with SLP only (with SLP and RT) PMSV Supervision: Full           Oral care BID   Frequent or  constant Supervision/Assistance Aphonia (R49.1)     Continue with current plan of care   Rolena Infante, MS [facility] SLP Acute Rehab Services Office [phone]   Chales Abrahams  02/23/2023, 4:54 PM

## 2023-02-23 NOTE — Progress Notes (Signed)
RT transported patient to radiation on LTV vent with no complications.  Once back from radiation patient was placed back on Servo-I.

## 2023-02-24 ENCOUNTER — Other Ambulatory Visit: Payer: Self-pay

## 2023-02-24 ENCOUNTER — Inpatient Hospital Stay (HOSPITAL_COMMUNITY): Payer: 59

## 2023-02-24 ENCOUNTER — Ambulatory Visit
Admission: RE | Admit: 2023-02-24 | Discharge: 2023-02-24 | Disposition: A | Discharge status: Home / Self Care | Payer: 59 | Source: Ambulatory Visit | Attending: Radiation Oncology | Admitting: Radiation Oncology

## 2023-02-24 ENCOUNTER — Telehealth: Payer: Self-pay | Admitting: Pulmonary Disease

## 2023-02-24 DIAGNOSIS — J449 Chronic obstructive pulmonary disease, unspecified: Secondary | ICD-10-CM

## 2023-02-24 DIAGNOSIS — J988 Other specified respiratory disorders: Secondary | ICD-10-CM | POA: Diagnosis not present

## 2023-02-24 DIAGNOSIS — Z510 Encounter for antineoplastic radiation therapy: Secondary | ICD-10-CM | POA: Diagnosis not present

## 2023-02-24 DIAGNOSIS — Z930 Tracheostomy status: Secondary | ICD-10-CM | POA: Diagnosis not present

## 2023-02-24 DIAGNOSIS — C3432 Malignant neoplasm of lower lobe, left bronchus or lung: Secondary | ICD-10-CM | POA: Diagnosis not present

## 2023-02-24 DIAGNOSIS — R918 Other nonspecific abnormal finding of lung field: Secondary | ICD-10-CM | POA: Diagnosis not present

## 2023-02-24 DIAGNOSIS — C3490 Malignant neoplasm of unspecified part of unspecified bronchus or lung: Secondary | ICD-10-CM

## 2023-02-24 DIAGNOSIS — Z9911 Dependence on respirator [ventilator] status: Secondary | ICD-10-CM | POA: Diagnosis not present

## 2023-02-24 DIAGNOSIS — C3431 Malignant neoplasm of lower lobe, right bronchus or lung: Secondary | ICD-10-CM | POA: Diagnosis not present

## 2023-02-24 DIAGNOSIS — Z87891 Personal history of nicotine dependence: Secondary | ICD-10-CM | POA: Diagnosis not present

## 2023-02-24 LAB — RAD ONC ARIA SESSION SUMMARY
Course Elapsed Days: 37
Plan Fractions Treated to Date: 24
Plan Prescribed Dose Per Fraction: 2 Gy
Plan Total Fractions Prescribed: 27
Plan Total Prescribed Dose: 54 Gy
Reference Point Dosage Given to Date: 48 Gy
Reference Point Session Dosage Given: 2 Gy
Session Number: 27

## 2023-02-24 LAB — GLUCOSE, CAPILLARY
Glucose-Capillary: 104 mg/dL — ABNORMAL HIGH (ref 70–99)
Glucose-Capillary: 98 mg/dL (ref 70–99)
Glucose-Capillary: 125 mg/dL — ABNORMAL HIGH (ref 70–99)
Glucose-Capillary: 112 mg/dL — ABNORMAL HIGH (ref 70–99)
Glucose-Capillary: 135 mg/dL — ABNORMAL HIGH (ref 70–99)
Glucose-Capillary: 103 mg/dL — ABNORMAL HIGH (ref 70–99)
Glucose-Capillary: 124 mg/dL — ABNORMAL HIGH (ref 70–99)
Glucose-Capillary: 91 mg/dL (ref 70–99)
Glucose-Capillary: 116 mg/dL — ABNORMAL HIGH (ref 70–99)
Glucose-Capillary: 143 mg/dL — ABNORMAL HIGH (ref 70–99)

## 2023-02-24 MED ORDER — CLONAZEPAM 1 MG PO TABS
2.0000 mg | ORAL_TABLET | Freq: Two times a day (BID) | ORAL | Status: DC
  Administered 2023-02-24: 2 mg
  Filled 2023-02-24 (×2): qty 2

## 2023-02-24 MED ORDER — FAMOTIDINE 40 MG/5ML PO SUSR
20.0000 mg | Freq: Two times a day (BID) | ORAL | Status: DC
  Administered 2023-02-24 – 2023-03-01 (×11): 20 mg
  Filled 2023-02-24 (×14): qty 2.5

## 2023-02-24 MED ORDER — SODIUM CHLORIDE 0.9 % IV BOLUS
1000.0000 mL | Freq: Once | INTRAVENOUS | Status: AC
  Administered 2023-02-24: 1000 mL via INTRAVENOUS

## 2023-02-24 MED ORDER — PROPOFOL 1000 MG/100ML IV EMUL
0.0000 ug/kg/min | INTRAVENOUS | Status: DC
  Administered 2023-02-24: 5 ug/kg/min via INTRAVENOUS
  Administered 2023-02-25: 15 ug/kg/min via INTRAVENOUS
  Administered 2023-02-25: 10 ug/kg/min via INTRAVENOUS
  Administered 2023-02-26: 15 ug/kg/min via INTRAVENOUS
  Administered 2023-02-26: 30 ug/kg/min via INTRAVENOUS
  Administered 2023-02-26 – 2023-02-27 (×4): 20 ug/kg/min via INTRAVENOUS
  Filled 2023-02-24 (×9): qty 100

## 2023-02-24 NOTE — Progress Notes (Addendum)
Physical Therapy Treatment Patient Details Name: [name] MRN: [id number] DOB: Nov 06, 1955 Today's Date: 02/24/2023   History of Present Illness 67 year old woman who presented to [facility] 5/1 for nausea/vomiting/diarrhea and abdominal pain x 2 weeks.  Found to have RML and RLL post-obstructive pneumonia with Rt hilar mass, new onset A fib.  She required intubation on 5/06 for hypoxic respiratory failure.  Transferred to [facility] on 5/07 for cancer therapy.  Pt currently with failure to wean from ventilator s/p tracheostomy and should finish XRT (for NSCLC) next week. PMHx significant for COPD, lung CA (adenocarcinoma of LLL, biopsy planned for 5/7), bladder CA (s/p TURBT), GERD, and panic/anxiety.    PT Comments    Pt medicated for anxiety prior to session.  Pt groggy but following commands and participating with exercises.  Pt fatigued quickly however.  Pt scheduled for xrt as well as possible PEG later today (also lethargic) so did not perform OOB this session.    Recommendations for follow up therapy are one component of a multi-disciplinary discharge planning process, led by the attending physician.  Recommendations may be updated based on patient status, additional functional criteria and insurance authorization.  Follow Up Recommendations       Assistance Recommended at Discharge Frequent or constant Supervision/Assistance  Patient can return home with the following Assist for transportation;Help with stairs or ramp for entrance;Assistance with cooking/housework;Two people to help with walking and/or transfers;Two people to help with bathing/dressing/bathroom   Equipment Recommendations  None recommended by PT    Recommendations for Other Services       Precautions / Restrictions Precautions Precautions: Fall Precaution Comments: monitor vitals, NG feeding tube, trach to vent     Mobility  Bed Mobility Overal bed mobility: Needs Assistance             General bed mobility  comments: repositioned to comfort, floating bil heels on pillows    Transfers                        Ambulation/Gait                   Stairs             Wheelchair Mobility    Modified Rankin (Stroke Patients Only)       Balance                                            Cognition Arousal/Alertness: Suspect due to medications, Lethargic Behavior During Therapy: Flat affect Overall Cognitive Status: Difficult to assess                                 General Comments: pt appropriate with head nods, appears groggy although medicated for anxiety earlier, does follow simple commands        Exercises General Exercises - Lower Extremity Ankle Circles/Pumps: Both, 15 reps, Supine, AAROM Quad Sets: AROM, Both, 10 reps Heel Slides: Both, Supine, AROM, 5 reps Hip ABduction/ADduction: AAROM, Both, Supine, 5 reps Other Exercises Other Exercises: forward reaching bilaterally x5 Other Exercises: shoulder elevation/depression x5    General Comments        Pertinent Vitals/Pain Pain Assessment Pain Assessment: No/denies pain Pain Intervention(s): Monitored during session, Repositioned (medicated for anxiety prior to session)  Ventilator to  trach: PEEP 5, RR 28, O2 conc 60%    Home Living                          Prior Function            PT Goals (current goals can now be found in the care plan section) Progress towards PT goals: Progressing toward goals    Frequency    Min 1X/week      PT Plan Current plan remains appropriate    Co-evaluation              AM-PAC PT "6 Clicks" Mobility   Outcome Measure  Help needed turning from your back to your side while in a flat bed without using bedrails?: A Lot Help needed moving from lying on your back to sitting on the side of a flat bed without using bedrails?: A Lot Help needed moving to and from a bed to a chair (including a  wheelchair)?: Total Help needed standing up from a chair using your arms (e.g., wheelchair or bedside chair)?: Total Help needed to walk in hospital room?: Total Help needed climbing 3-5 steps with a railing? : Total 6 Click Score: 8    End of Session   Activity Tolerance: Patient limited by fatigue Patient left: in bed;with call bell/phone within reach   PT Visit Diagnosis: Muscle weakness (generalized) (M62.81)     Time: 1610-9604 PT Time Calculation (min) (ACUTE ONLY): 12 min  Charges:  $Therapeutic Exercise: 8-22 mins                    Paulino Door, DPT Physical Therapist Acute Rehabilitation Services Office: [phone]    [name] 02/24/2023, 12:09 PM

## 2023-02-24 NOTE — Progress Notes (Addendum)
NAME:  [name], MRN:  [id number], DOB:  [birth year], LOS: 43 ADMISSION DATE:  01/11/2023, CONSULTATION DATE:  01/16/2023 REFERRING MD: FMTS, CHIEF COMPLAINT: Acute Respiratory Failure in the setting of Lung Cancer, COPD and infiltrate on CXR   History of Present Illness:  67 year old woman, former smoker who presented to [facility] ED 5/1 with nausea, vomiting, diarrhea and abdominal pain for 2 weeks.  Found to have RML and RLL post-obstructive pneumonia with Rt hilar mass, new onset A fib.  She required intubation on 5/06 for hypoxic respiratory failure.  Transferred to [facility] on 5/07 for cancer therapy.  Pertinent Medical History:  COPD, Adenocarcinoma LLL November 2021, Bladder cancer, GERD, Anxiety with panic attacks, Depression, Allergies  Significant Hospital Events: Including procedures, antibiotic start and stop dates in addition to other pertinent events   5/6 Transferred to ICU for acute Respiratory Failure , intubated  5/7 Bronch by Icard. significant extrinsic compression of the airways in the right lower lobe and middle lobe. This is likely malignant obstruction. Bx sent. Transferred to [facility] for rad/onc eval. PICC placed 5/8 Stable still high PEEP/FiO2 needs. Rad/onc consult requested  5/9 start radiation therapy 5/10 Tolerating SBT trial this AM on low-dose fentanyl and Precedex.  Propofol DC'd due to high triglycerides 5/11 Tolerates SBT's, pressure support 10/5 5/12 Off of pressors. Remains on vent, sedated with Precedex/Fentanyl. PMT consulted. 5/13 Weaning on PSV 14/8. > 10L positive over the course of admission. Diuresing. 5/14 Weaning on PSV. Ongoing diuresis. 5/15 Tolerating SBT, PSV 5/5, following commands. Continue diuresis, Foley placed for persistent urinary retention. Extubated, decompensated quickly after requiring reintubation. DHT placed. 5/16 Agitated overnight, c/o throat pain with ETT, RASS 1-2 despite sedation. Failed extubation. Tolerating SBT PSV 10/5, FiO2 35%.  ~1630 desaturated with inability to pass Ballard suction/difficulty bagging; urgent tube exchange completed with bougie and blood clot removed with improvement in oxygenation/ventilation. 5/17 Tracheostomy placed 5/19 Back in atrial fibrillation in am, HR 150s 5/20 Weaned on pressure support 10/5 5/21 Desaturated during weaning. Still on dex and fent gtt 5/23 added fent patch and seroquel had to have NE gtt and midodrine started for drug related hypotension 5/24 Trialing fent gtt off. Foley removed 5/25 3 hrs. trach collar 5/26 Pressure support minimal trach collar 5/28 SIMV/PS, increased pain. Changed oxy fent patch to dilaudid incr seroquel 5/29 radiation. Off dilaudid gtt. Lower dose precedex requirement  5/30 try to wean precedex. Repeat anxiety during/after rad. Incr anxiolytics 6/01 change to pressure control as rest mode 6/4 trial vent valve and able to phonate, trach collar trial for ~1-2 hours surprisingly and wore PMV for 5 minutes and able to phonate well  6/6 intermittent plugging, underwent bronch but required surprisingly little therapeutic aspiration  6/6-6/7 required 3U pRBC transfusion total without clear source of blood loss. Therapeutic AC has been held in advance of g-tube placement on 6/10, remains on ppx lovenox. 6/10 CT abdomen concerning for new nodular lesions in the soft tissue and retroperitoneal space. 6/12 Tolerated TCT with PMV in place x 1 hour, time-limited by anxiety. 6/13 XRT session. Transiently hypotensive overnight, Levo ordered but not started.  Interim History / Subjective:  Looks uncomfortable today, but feeling ok Breathing feels better Remains on PCV on vent Transiently hypotensive overnight, Levo ordered but not started S/p XRT yesterday, another session planned for today D/w IR re: PEG, repeat KUB today  Objective:  Blood pressure (!) 101/59, pulse (!) 110, temperature 99.3 F (37.4 C), temperature source Axillary, resp. rate (!) 27, height  5'  8" (1.727 m), weight 92.9 kg, SpO2 98 %.    Vent Mode: PCV FiO2 (%):  [50 %-60 %] 60 % Set Rate:  [24 bmp] 24 bmp PEEP:  [5 cmH20] 5 cmH20 Plateau Pressure:  [19 cmH20-39 cmH20] 25 cmH20   Intake/Output Summary (Last 24 hours) at 02/24/2023 0726 Last data filed at 02/24/2023 1610 Gross per 24 hour  Intake 2076.1 ml  Output 600 ml  Net 1476.1 ml    Filed Weights   02/21/23 0500 02/22/23 0500 02/23/23 0500  Weight: 90.1 kg 91.1 kg 92.9 kg   Physical Examination: General: Acute-on-chronically ill-appearing middle-aged woman in NAD. Appears uncomfortable. HEENT: /AT, anicteric sclera, PERRL, moist mucous membranes. Neuro: Awake, oriented x 4. Answering questions appropriately. Responds to verbal stimuli. Following commands consistently. Moves all 4 extremities spontaneously. Generalized weakness. +Cough and +Gag  CV: Tachycardic, regular rhythm, no m/g/r. PULM: Breathing even and minimally labored on vent (PCV PEEP 5, FiO2 60%). Lung fields with scattered rhonchi, L > R. GI: Soft, nontender, mildly distended. Hypoactive bowel sounds. Extremities: Bilateral 1+ pitting LE edema noted. Skin: Warm/dry, no rashes.  Resolved Hospital Problem List:  Hypertriglyceridemia due to propofol, Hyponatremia , Hypotension drug induced from sedation. Vocal cord edema, Post obstructive PNA: Zosyn completed 5/13, Hypotension from hypovolemia and sedation  Assessment & Plan:   Acute hypoxic respiratory failure from post-obstructive pneumonia and atelectasis in setting of COPD with emphysema Failure to wean s/p tracheostomy Bronchospasm Mucous Plugging - Vent support, PCV as rest mode - TCT as tolerated - Continue to work with SLP on PMV tolerance - Wean FiO2 for O2 sat > 90% - Bronchodilators (triple therapy with Brovana/Yupelri, Pulmicort), albuterol PRN - Consider diuresis, as 1.5L net positive/24H, +15.6L/admission - VAP bundle - Pulmonary hygiene - Trach care per protocol - TOC following  for placement; barriers to vent liberation include anxiety, deconditioning, tumor burden  Rt hilar mass with poorly differentiated NSCLC Hx of LLL adenocarcinoma from November 2021 CT Abdomen 6/10 with c/f intraabdominal mets, Onc aware. - Ongoing XRT per Rad-Onc, due to complete next week. - S/p Decadron course, defer to Onc re: continuation - Patient wishes to continue radiation then further discuss trajectory; per Dr. Arbutus Ped would need to make significant progress/recover from respiratory failure before she would be a candidate for chemotherapy  Acute metabolic encephalopathy from sepsis and hypoxia Hx of depression, anxiety, chronic cancer pain Anxiety remains a significant barrier to ventilator weaning/overall progress. - PAD protocol for sedation: Precedex for goal RASS  0 to +1 - Continue Celexa, Buspar, Klonopin (scheduled) - Continue gabapentin, Dilaudid PRN - Continue Remeron, Seroquel QHS  Paroxysmal atrial fibrillation HLD, HTN Paroxysmal Afib - Cardiac monitoring - Optimize electrolytes for K > 4, Mg > 2 - Lovenox for AC while anticipating eventual PEG placement - Continue statin   Anemia of critical illness, iron deficiency, and chronic disease, ICU phlebotomy Required 3U PRBCs 6/6-6/7, holding therapeutic AC. - Monitor for signs of active bleeding - Limit labs as able - Transfuse for Hgb < 7.0 or hemodynamically significant bleeding - Remains on Lovenox, Eliquis held - Continue iron supplementation   Urine retention - foley DC 6/10 - Continue bethanechol  Physical deconditioning - PT/OT/SLP as able - TOC engaged for placement options  Goals of care Revisited on 6/4, continuing current measures including goal to complete planned course of XRT (now 20/30 treatments completed) - PMT consulted  Best Practice: (right click and "Reselect all SmartList Selections" daily)   Diet/type: tubefeeds DVT prophylaxis:  Lovenox, hold Eliquis GI prophylaxis: H2B Lines:  Central line - PICC Foley:  Yes Code Status:  full code Last date of multidisciplinary goals of care discussion: 6/4 - remains full code, wishes to continue XRT treatment  Signature:   Faythe Ghee [facility] 02/24/23 7:26 AM  Please see [email or web address] for pager details.  From 7A-7P if no response, please call [phone] After hours, please call ELink [phone]

## 2023-02-24 NOTE — Telephone Encounter (Signed)
PT's mom has been in ICU since May  They would like an order sent in to terminate her O2 in her home.  Daughter oversees her care [name]) and she would be the one to contact to advise on action taken . Please do call.  [phone]

## 2023-02-24 NOTE — Plan of Care (Signed)
  Problem: Clinical Measurements: Goal: Ability to maintain clinical measurements within normal limits will improve Outcome: Not Progressing Goal: Will remain free from infection Outcome: Not Progressing Goal: Diagnostic test results will improve Outcome: Not Progressing Goal: Respiratory complications will improve Outcome: Not Progressing Goal: Cardiovascular complication will be avoided Outcome: Not Progressing   Problem: Coping: Goal: Level of anxiety will decrease Outcome: Not Progressing

## 2023-02-24 NOTE — Progress Notes (Signed)
eLink Physician-Brief Progress Note Patient Name: [name] DOB: 06/25/1956 MRN: [id number]   Date of Service  02/24/2023  HPI/Events of Note  Notified of hypotension. BP 79/48, MAP 56, HR 89. SpO2 84% on FiO2 60% Review of I/Os indicate she is +15L since admission, with small bilateral effusions on prior imaging.    eICU Interventions  Will restart levophed infusion. Titrate to maintain MAP >65        Dalyn Kjos M DELA CRUZ 02/24/2023, 12:00 AM

## 2023-02-24 NOTE — Progress Notes (Signed)
OT Cancellation Note  Patient Details Name: [name] MRN: [id number] DOB: Jan 30, 1956   Cancelled Treatment:    Reason Eval/Treat Not Completed: Other (comment). The pt is currently receiving radiation.   Reuben Likes, OTR/L 02/24/2023, 3:02 PM

## 2023-02-24 NOTE — Progress Notes (Signed)
RT transported patient to radiation on LTV vent with no complications. Once back to room patient was placed back on Servo -I.

## 2023-02-25 ENCOUNTER — Inpatient Hospital Stay (HOSPITAL_COMMUNITY): Payer: 59

## 2023-02-25 DIAGNOSIS — J9601 Acute respiratory failure with hypoxia: Secondary | ICD-10-CM | POA: Diagnosis not present

## 2023-02-25 LAB — CBC
HCT: 26.6 % — ABNORMAL LOW (ref 36.0–46.0)
Hemoglobin: 8 g/dL — ABNORMAL LOW (ref 12.0–15.0)
MCH: 26.9 pg (ref 26.0–34.0)
MCHC: 30.1 g/dL (ref 30.0–36.0)
MCV: 89.6 fL (ref 80.0–100.0)
Platelets: 366 10*3/uL (ref 150–400)
RBC: 2.97 MIL/uL — ABNORMAL LOW (ref 3.87–5.11)
RDW: 15.3 % (ref 11.5–15.5)
WBC: 8.8 10*3/uL (ref 4.0–10.5)
nRBC: 0 % (ref 0.0–0.2)

## 2023-02-25 LAB — BASIC METABOLIC PANEL
Anion gap: 7 (ref 5–15)
BUN: 34 mg/dL — ABNORMAL HIGH (ref 8–23)
CO2: 29 mmol/L (ref 22–32)
Calcium: 8.4 mg/dL — ABNORMAL LOW (ref 8.9–10.3)
Chloride: 101 mmol/L (ref 98–111)
Creatinine, Ser: 0.64 mg/dL (ref 0.44–1.00)
GFR, Estimated: >60 mL/min (ref >60)
Glucose, Bld: 124 mg/dL — ABNORMAL HIGH (ref 70–99)
Potassium: 4.4 mmol/L (ref 3.5–5.1)
Sodium: 137 mmol/L (ref 135–145)

## 2023-02-25 LAB — GLUCOSE, CAPILLARY
Glucose-Capillary: 103 mg/dL — ABNORMAL HIGH (ref 70–99)
Glucose-Capillary: 107 mg/dL — ABNORMAL HIGH (ref 70–99)
Glucose-Capillary: 108 mg/dL — ABNORMAL HIGH (ref 70–99)
Glucose-Capillary: 111 mg/dL — ABNORMAL HIGH (ref 70–99)
Glucose-Capillary: 118 mg/dL — ABNORMAL HIGH (ref 70–99)
Glucose-Capillary: 154 mg/dL — ABNORMAL HIGH (ref 70–99)

## 2023-02-25 LAB — TRIGLYCERIDES: Triglycerides: 298 mg/dL — ABNORMAL HIGH (ref <150)

## 2023-02-25 LAB — CULTURE, RESPIRATORY W GRAM STAIN: Gram Stain: NONE SEEN

## 2023-02-25 MED ORDER — MIRTAZAPINE 15 MG PO TABS
30.0000 mg | ORAL_TABLET | Freq: Every day | ORAL | Status: DC
  Administered 2023-02-25 – 2023-03-01 (×5): 30 mg
  Filled 2023-02-25 (×5): qty 2

## 2023-02-25 MED ORDER — CLONAZEPAM 1 MG PO TABS
2.0000 mg | ORAL_TABLET | Freq: Three times a day (TID) | ORAL | Status: DC
  Administered 2023-02-25 – 2023-03-02 (×15): 2 mg
  Filled 2023-02-25 (×15): qty 2

## 2023-02-25 NOTE — Progress Notes (Signed)
NAME:  [name], MRN:  [id number], DOB:  1956/02/06, LOS: 44 ADMISSION DATE:  01/11/2023, CONSULTATION DATE:  01/16/2023 REFERRING MD: FMTS, CHIEF COMPLAINT: Acute Respiratory Failure in the setting of Lung Cancer, COPD and infiltrate on CXR   History of Present Illness:  67 year old woman, former smoker who presented to [facility] ED 5/1 with nausea, vomiting, diarrhea and abdominal pain for 2 weeks.  Found to have RML and RLL post-obstructive pneumonia with Rt hilar mass, new onset A fib.  She required intubation on 5/06 for hypoxic respiratory failure.  Transferred to [facility] on 5/07 for cancer therapy.  Pertinent Medical History:  COPD, Adenocarcinoma LLL November 2021, Bladder cancer, GERD, Anxiety with panic attacks, Depression, Allergies  Significant Hospital Events: Including procedures, antibiotic start and stop dates in addition to other pertinent events   5/6 Transferred to ICU for acute Respiratory Failure , intubated  5/7 Bronch by Icard. significant extrinsic compression of the airways in the right lower lobe and middle lobe. This is likely malignant obstruction. Bx sent. Transferred to [facility] for rad/onc eval. PICC placed 5/8 Stable still high PEEP/FiO2 needs. Rad/onc consult requested  5/9 start radiation therapy 5/10 Tolerating SBT trial this AM on low-dose fentanyl and Precedex.  Propofol DC'd due to high triglycerides 5/11 Tolerates SBT's, pressure support 10/5 5/12 Off of pressors. Remains on vent, sedated with Precedex/Fentanyl. PMT consulted. 5/13 Weaning on PSV 14/8. > 10L positive over the course of admission. Diuresing. 5/14 Weaning on PSV. Ongoing diuresis. 5/15 Tolerating SBT, PSV 5/5, following commands. Continue diuresis, Foley placed for persistent urinary retention. Extubated, decompensated quickly after requiring reintubation. DHT placed. 5/16 Agitated overnight, c/o throat pain with ETT, RASS 1-2 despite sedation. Failed extubation. Tolerating SBT PSV 10/5, FiO2 35%.  ~1630 desaturated with inability to pass Ballard suction/difficulty bagging; urgent tube exchange completed with bougie and blood clot removed with improvement in oxygenation/ventilation. 5/17 Tracheostomy placed 5/19 Back in atrial fibrillation in am, HR 150s 5/20 Weaned on pressure support 10/5 5/21 Desaturated during weaning. Still on dex and fent gtt 5/23 added fent patch and seroquel had to have NE gtt and midodrine started for drug related hypotension 5/24 Trialing fent gtt off. Foley removed 5/25 3 hrs. trach collar 5/26 Pressure support minimal trach collar 5/28 SIMV/PS, increased pain. Changed oxy fent patch to dilaudid incr seroquel 5/29 radiation. Off dilaudid gtt. Lower dose precedex requirement  5/30 try to wean precedex. Repeat anxiety during/after rad. Incr anxiolytics 6/01 change to pressure control as rest mode 6/4 trial vent valve and able to phonate, trach collar trial for ~1-2 hours surprisingly and wore PMV for 5 minutes and able to phonate well  6/6 intermittent plugging, underwent bronch but required surprisingly little therapeutic aspiration  6/6-6/7 required 3U pRBC transfusion total without clear source of blood loss. Therapeutic AC has been held in advance of g-tube placement on 6/10, remains on ppx lovenox. 6/10 CT abdomen concerning for new nodular lesions in the soft tissue and retroperitoneal space. 6/12 Tolerated TCT with PMV in place x 1 hour, time-limited by anxiety. 6/13 XRT session. Transiently hypotensive overnight, Levo ordered but not started. 6/14 XRT session.  Increased anxiety >> added diprivan and continue precedex  Interim History / Subjective:  Feels more calm this morning.    Objective:  Blood pressure (!) 141/63, pulse (!) 119, temperature 98.5 F (36.9 C), temperature source Axillary, resp. rate (!) 27, height 5\' 8"  (1.727 m), weight 92.9 kg, SpO2 94 %.    Vent Mode: PCV FiO2 (%):  [  50 %-60 %] 50 % Set Rate:  [24 bmp] 24 bmp PEEP:  [5  cmH20] 5 cmH20 Plateau Pressure:  [19 cmH20-42 cmH20] 19 cmH20   Intake/Output Summary (Last 24 hours) at 02/25/2023 0910 Last data filed at 02/25/2023 0716 Gross per 24 hour  Intake 1763.29 ml  Output 500 ml  Net 1263.29 ml   Filed Weights   02/21/23 0500 02/22/23 0500 02/23/23 0500  Weight: 90.1 kg 91.1 kg 92.9 kg   Physical Examination:  General - alert Eyes - pupils reactive ENT - trach site clean Cardiac - regular, tachycardic Chest - scattered rhonchi more at bases Abdomen - soft, non tender, + bowel sounds Extremities - 1+ edema Skin - no rashes Neuro - moves extremities, follows commands  Resolved Hospital Problem List:  Hyponatremia , Hypotension drug induced from sedation. Vocal cord edema, Post obstructive PNA: Zosyn completed 5/13, Hypotension from hypovolemia and sedation  Assessment & Plan:   Acute hypoxic respiratory failure in setting of NSCLC, post obstructive pneumonia, and mucus plugging with atelectasis. Failure to wean from ventilator s/p tracheostomy. COPD with emphysema. - PS wean to TC as able - PMV as able with speech when on TC - PC for vent rest mode - follow up CXR intermittently - will need to look into LTACH once she has completed XRT - has increased ASD on CXR and Tm 100.21F from 6/14 >> check sputum culture; hold ABx for now   Rt hilar mass from NSCLC with hx of LLL adenocarcinoma in November 2021. - should complete XRT week of 6/17 - concern she has new metastatic lesions seen on CT abd/pelvis from 6/09 - d/w pt and family >> continue current cancer therapies - seen by Dr. Arbutus Ped >> would need to recover from respiratory failure before being a candidate for systemic chemotherapy   Acute metabolic encephalopathy from sepsis and hypoxia. Depression with anxiety. Cancer pain. - continue diprivan with precedex - increase remeron to 30 mg qhs, klonopin to 2 mg q8h - continue buspar 20 mg bid, celexa 20 mg daily, neurontin 600 mg tid,  dilaudid 1 mg q4h, seroquel 100 mg qhs   Paroxysmal atrial fibrillation. Hx of HTN, HLD. - continue lipitor - hold eliquis for now; continue DVT dose of lovenox   Dysphagia. - IR to follow up week of 6/17 and arrange for PEG   Anemia of critical illness and chronic disease. - f/u CBC intermittently  Urine retention. - foley d/c'ed 6/10 - continue bethanechol  Physical deconditioning - PT/OT/SLP as able - TOC engaged for placement options  Goals of care - Revisited on 6/4, continuing current measures including goal to complete planned course of XRT - PMT consulted  Best Practice: (right click and "Reselect all SmartList Selections" daily)   Diet/type: tubefeeds DVT prophylaxis: Lovenox, hold Eliquis GI prophylaxis: H2B Lines: Central line - PICC Foley:  No Code Status:  full code Last date of multidisciplinary goals of care discussion: 6/4 - remains full code, wishes to continue XRT treatment  Labs:      Latest Ref Rng & Units 02/25/2023    5:45 AM 02/21/2023    6:03 AM 02/20/2023    4:13 AM  CMP  Glucose 70 - 99 mg/dL 409  94  811   BUN 8 - 23 mg/dL 34  29  30   Creatinine 0.44 - 1.00 mg/dL 9.14  7.82  9.56   Sodium 135 - 145 mmol/L 137  138  137   Potassium 3.5 - 5.1 mmol/L  4.4  4.2  4.0   Chloride 98 - 111 mmol/L 101  100  99   CO2 22 - 32 mmol/L 29  29  28    Calcium 8.9 - 10.3 mg/dL 8.4  8.9  8.7       Latest Ref Rng & Units 02/25/2023    5:45 AM 02/21/2023    6:03 AM 02/20/2023    4:13 AM  CBC  WBC 4.0 - 10.5 K/uL 8.8  10.3  9.8   Hemoglobin 12.0 - 15.0 g/dL 8.0  8.0  8.0   Hematocrit 36.0 - 46.0 % 26.6  26.2  25.6   Platelets 150 - 400 K/uL 366  398  440    CBG (last 3)  Recent Labs    02/24/23 2031 02/25/23 0009 02/25/23 0415  GLUCAP 143* 154* 111*   ABG    Component Value Date/Time   PHART 7.550 (H) 01/17/2023 0343   PCO2ART 38.4 01/17/2023 0343   PO2ART 53 (L) 01/17/2023 0343   HCO3 29.9 (H) 02/13/2023 0916   TCO2 35 (H) 01/17/2023 0343    O2SAT 60.2 02/13/2023 0916   Signature:  Coralyn Helling, MD [place] [facility] Pager - [phone] or [phone] 02/25/2023, 9:20 AM

## 2023-02-26 DIAGNOSIS — J9601 Acute respiratory failure with hypoxia: Secondary | ICD-10-CM | POA: Diagnosis not present

## 2023-02-26 LAB — GLUCOSE, CAPILLARY
Glucose-Capillary: 113 mg/dL — ABNORMAL HIGH (ref 70–99)
Glucose-Capillary: 131 mg/dL — ABNORMAL HIGH (ref 70–99)
Glucose-Capillary: 136 mg/dL — ABNORMAL HIGH (ref 70–99)
Glucose-Capillary: 106 mg/dL — ABNORMAL HIGH (ref 70–99)
Glucose-Capillary: 93 mg/dL (ref 70–99)

## 2023-02-26 LAB — BASIC METABOLIC PANEL
Anion gap: 8 (ref 5–15)
BUN: 29 mg/dL — ABNORMAL HIGH (ref 8–23)
CO2: 29 mmol/L (ref 22–32)
Calcium: 8.2 mg/dL — ABNORMAL LOW (ref 8.9–10.3)
Chloride: 100 mmol/L (ref 98–111)
Creatinine, Ser: 0.6 mg/dL (ref 0.44–1.00)
GFR, Estimated: >60 mL/min (ref >60)
Glucose, Bld: 127 mg/dL — ABNORMAL HIGH (ref 70–99)
Potassium: 4.6 mmol/L (ref 3.5–5.1)
Sodium: 137 mmol/L (ref 135–145)

## 2023-02-26 LAB — CBC
HCT: 25.1 % — ABNORMAL LOW (ref 36.0–46.0)
Hemoglobin: 7.9 g/dL — ABNORMAL LOW (ref 12.0–15.0)
MCH: 28.4 pg (ref 26.0–34.0)
MCHC: 31.5 g/dL (ref 30.0–36.0)
MCV: 90.3 fL (ref 80.0–100.0)
Platelets: 351 10*3/uL (ref 150–400)
RBC: 2.78 MIL/uL — ABNORMAL LOW (ref 3.87–5.11)
RDW: 15.5 % (ref 11.5–15.5)
WBC: 11.2 10*3/uL — ABNORMAL HIGH (ref 4.0–10.5)
nRBC: 0 % (ref 0.0–0.2)

## 2023-02-26 LAB — CULTURE, RESPIRATORY W GRAM STAIN

## 2023-02-26 LAB — MAGNESIUM: Magnesium: 2.2 mg/dL (ref 1.7–2.4)

## 2023-02-26 LAB — PHOSPHORUS: Phosphorus: 3.8 mg/dL (ref 2.5–4.6)

## 2023-02-26 NOTE — Progress Notes (Signed)
NAME:  [name], MRN:  [id number], DOB:  06-Feb-1956, LOS: 45 ADMISSION DATE:  01/11/2023, CONSULTATION DATE:  01/16/2023 REFERRING MD: FMTS, CHIEF COMPLAINT: Acute Respiratory Failure in the setting of Lung Cancer, COPD and infiltrate on CXR   History of Present Illness:  67 year old woman, former smoker who presented to [facility] ED 5/1 with nausea, vomiting, diarrhea and abdominal pain for 2 weeks.  Found to have RML and RLL post-obstructive pneumonia with Rt hilar mass, new onset A fib.  She required intubation on 5/06 for hypoxic respiratory failure.  Transferred to [facility] on 5/07 for cancer therapy.  Pertinent Medical History:  COPD, Adenocarcinoma LLL November 2021, Bladder cancer, GERD, Anxiety with panic attacks, Depression, Allergies  Significant Hospital Events: Including procedures, antibiotic start and stop dates in addition to other pertinent events   5/6 Transferred to ICU for acute Respiratory Failure , intubated  5/7 Bronch by Icard. significant extrinsic compression of the airways in the right lower lobe and middle lobe. This is likely malignant obstruction. Bx sent. Transferred to [facility] for rad/onc eval. PICC placed 5/8 Stable still high PEEP/FiO2 needs. Rad/onc consult requested  5/9 start radiation therapy 5/10 Tolerating SBT trial this AM on low-dose fentanyl and Precedex.  Propofol DC'd due to high triglycerides 5/11 Tolerates SBT's, pressure support 10/5 5/12 Off of pressors. Remains on vent, sedated with Precedex/Fentanyl. PMT consulted. 5/13 Weaning on PSV 14/8. > 10L positive over the course of admission. Diuresing. 5/14 Weaning on PSV. Ongoing diuresis. 5/15 Tolerating SBT, PSV 5/5, following commands. Continue diuresis, Foley placed for persistent urinary retention. Extubated, decompensated quickly after requiring reintubation. DHT placed. 5/16 Agitated overnight, c/o throat pain with ETT, RASS 1-2 despite sedation. Failed extubation. Tolerating SBT PSV 10/5, FiO2 35%.  ~1630 desaturated with inability to pass Ballard suction/difficulty bagging; urgent tube exchange completed with bougie and blood clot removed with improvement in oxygenation/ventilation. 5/17 Tracheostomy placed 5/19 Back in atrial fibrillation in am, HR 150s 5/20 Weaned on pressure support 10/5 5/21 Desaturated during weaning. Still on dex and fent gtt 5/23 added fent patch and seroquel had to have NE gtt and midodrine started for drug related hypotension 5/24 Trialing fent gtt off. Foley removed 5/25 3 hrs. trach collar 5/26 Pressure support minimal trach collar 5/28 SIMV/PS, increased pain. Changed oxy fent patch to dilaudid incr seroquel 5/29 radiation. Off dilaudid gtt. Lower dose precedex requirement  5/30 try to wean precedex. Repeat anxiety during/after rad. Incr anxiolytics 6/01 change to pressure control as rest mode 6/4 trial vent valve and able to phonate, trach collar trial for ~1-2 hours surprisingly and wore PMV for 5 minutes and able to phonate well  6/6 intermittent plugging, underwent bronch but required surprisingly little therapeutic aspiration  6/6-6/7 required 3U pRBC transfusion total without clear source of blood loss. Therapeutic AC has been held in advance of g-tube placement on 6/10, remains on ppx lovenox. 6/10 CT abdomen concerning for new nodular lesions in the soft tissue and retroperitoneal space. 6/12 Tolerated TCT with PMV in place x 1 hour, time-limited by anxiety. 6/13 XRT session. Transiently hypotensive overnight, Levo ordered but not started. 6/14 XRT session.  Increased anxiety >> added diprivan and continue precedex  Interim History / Subjective:  Resting comfortably.  Blood pressure runs low when she is asleep, but improves when she wakes up.  Objective:  Blood pressure 101/65, pulse 96, temperature 97.8 F (36.6 C), temperature source Oral, resp. rate (!) 22, height 5\' 8"  (1.727 m), weight 92.9 kg, SpO2 92 %.  Vent Mode: PCV FiO2 (%):  [50  %-60 %] 60 % Set Rate:  [24 bmp] 24 bmp PEEP:  [5 cmH20] 5 cmH20 Plateau Pressure:  [12 cmH20-24 cmH20] 24 cmH20   Intake/Output Summary (Last 24 hours) at 02/26/2023 0828 Last data filed at 02/26/2023 1610 Gross per 24 hour  Intake 1987.87 ml  Output 825 ml  Net 1162.87 ml   Filed Weights   02/21/23 0500 02/22/23 0500 02/23/23 0500  Weight: 90.1 kg 91.1 kg 92.9 kg   Physical Examination:  General - sedated Eyes - pupils reactive ENT - trach site clean Cardiac - regular rate/rhythm, no murmur Chest - basilar crackles Abdomen - soft, non tender, + bowel sounds Extremities - no cyanosis, clubbing, or edema Skin - no rashes Neuro - moves extremities, follows commands  Resolved Hospital Problem List:  Hyponatremia , Hypotension drug induced from sedation. Vocal cord edema, Post obstructive PNA: Zosyn completed 5/13, Hypotension from hypovolemia and sedation  Assessment & Plan:   Acute hypoxic respiratory failure in setting of NSCLC, post obstructive pneumonia, and mucus plugging with atelectasis. Failure to wean from ventilator s/p tracheostomy. COPD with emphysema. - PS wean to TC as able - PMV as able with speech when on TC - PC for vent rest mode - follow up CXR intermittently - will need to look into LTACH once she has completed XRT - has increased ASD on CXR and Tm 100.3F from 6/14 >> follow up sputum culture from 6/15 >> defer Abx for now   Rt hilar mass from NSCLC with hx of LLL adenocarcinoma in November 2021. - should be completing XRT the week of 6/17 - concern she has new metastatic lesions seen on CT abd/pelvis from 6/09 - d/w pt and family >> continue current cancer therapies - seen by Dr. Arbutus Ped >> would need to recover from respiratory failure before being a candidate for systemic chemotherapy   Acute metabolic encephalopathy from sepsis and hypoxia. Depression with anxiety. Cancer pain. - continue diprivan with precedex - continue buspar 20 mg bid,  celexa 20 mg daily, neurontin 600 mg tid, dilaudid 1 mg q4h, seroquel 100 mg at bedtime, remeron 30 mg at bedtime, klonopin 2 mg q8h   Paroxysmal atrial fibrillation. Hx of HTN, HLD. - continue lipitor - hold eliquis for now; continue DVT dose of lovenox   Dysphagia. - IR to follow up week of 6/17 and arrange for PEG   Anemia of critical illness and chronic disease. - f/u CBC intermittently  Urine retention. - foley d/c'ed 6/10 - continue bethanechol  Physical deconditioning - PT/OT/SLP as able - TOC engaged for placement options  Goals of care - Revisited on 6/4, continuing current measures including goal to complete planned course of XRT - PMT consulted  Best Practice: (right click and "Reselect all SmartList Selections" daily)   Diet/type: tubefeeds DVT prophylaxis: Lovenox, hold Eliquis GI prophylaxis: H2B Lines: Central line - PICC Foley:  No Code Status:  full code Last date of multidisciplinary goals of care discussion: 6/4 - remains full code, wishes to continue XRT treatment  Labs:      Latest Ref Rng & Units 02/26/2023    6:03 AM 02/25/2023    5:45 AM 02/21/2023    6:03 AM  CMP  Glucose 70 - 99 mg/dL 960  454  94   BUN 8 - 23 mg/dL 29  34  29   Creatinine 0.44 - 1.00 mg/dL 0.98  1.19  1.47   Sodium 135 - 145 mmol/L  137  137  138   Potassium 3.5 - 5.1 mmol/L 4.6  4.4  4.2   Chloride 98 - 111 mmol/L 100  101  100   CO2 22 - 32 mmol/L 29  29  29    Calcium 8.9 - 10.3 mg/dL 8.2  8.4  8.9       Latest Ref Rng & Units 02/26/2023    6:03 AM 02/25/2023    5:45 AM 02/21/2023    6:03 AM  CBC  WBC 4.0 - 10.5 K/uL 11.2  8.8  10.3   Hemoglobin 12.0 - 15.0 g/dL 7.9  8.0  8.0   Hematocrit 36.0 - 46.0 % 25.1  26.6  26.2   Platelets 150 - 400 K/uL 351  366  398    CBG (last 3)  Recent Labs    02/25/23 2007 02/26/23 0015 02/26/23 0348  GLUCAP 118* 131* 113*   ABG    Component Value Date/Time   PHART 7.550 (H) 01/17/2023 0343   PCO2ART 38.4 01/17/2023 0343    PO2ART 53 (L) 01/17/2023 0343   HCO3 29.9 (H) 02/13/2023 0916   TCO2 35 (H) 01/17/2023 0343   O2SAT 60.2 02/13/2023 0916   Signature:  Coralyn Helling, MD [facility] Pager - [phone] or [phone] 02/26/2023, 8:28 AM

## 2023-02-27 ENCOUNTER — Other Ambulatory Visit: Payer: Self-pay

## 2023-02-27 ENCOUNTER — Ambulatory Visit
Admission: RE | Admit: 2023-02-27 | Discharge: 2023-02-27 | Disposition: A | Discharge status: Home / Self Care | Payer: 59 | Source: Ambulatory Visit | Attending: Radiation Oncology | Admitting: Radiation Oncology

## 2023-02-27 DIAGNOSIS — Z510 Encounter for antineoplastic radiation therapy: Secondary | ICD-10-CM | POA: Diagnosis not present

## 2023-02-27 DIAGNOSIS — J988 Other specified respiratory disorders: Secondary | ICD-10-CM | POA: Diagnosis not present

## 2023-02-27 DIAGNOSIS — Z87891 Personal history of nicotine dependence: Secondary | ICD-10-CM | POA: Diagnosis not present

## 2023-02-27 DIAGNOSIS — C3432 Malignant neoplasm of lower lobe, left bronchus or lung: Secondary | ICD-10-CM | POA: Diagnosis not present

## 2023-02-27 DIAGNOSIS — J9601 Acute respiratory failure with hypoxia: Secondary | ICD-10-CM | POA: Diagnosis not present

## 2023-02-27 DIAGNOSIS — R918 Other nonspecific abnormal finding of lung field: Secondary | ICD-10-CM | POA: Diagnosis not present

## 2023-02-27 DIAGNOSIS — C3431 Malignant neoplasm of lower lobe, right bronchus or lung: Secondary | ICD-10-CM | POA: Diagnosis not present

## 2023-02-27 LAB — RAD ONC ARIA SESSION SUMMARY
Course Elapsed Days: 40
Plan Fractions Treated to Date: 25
Plan Prescribed Dose Per Fraction: 2 Gy
Plan Total Fractions Prescribed: 27
Plan Total Prescribed Dose: 54 Gy
Reference Point Dosage Given to Date: 50 Gy
Reference Point Session Dosage Given: 2 Gy
Session Number: 28

## 2023-02-27 LAB — CBC
HCT: 26 % — ABNORMAL LOW (ref 36.0–46.0)
Hemoglobin: 8 g/dL — ABNORMAL LOW (ref 12.0–15.0)
MCH: 27.2 pg (ref 26.0–34.0)
MCHC: 30.8 g/dL (ref 30.0–36.0)
MCV: 88.4 fL (ref 80.0–100.0)
Platelets: 344 10*3/uL (ref 150–400)
RBC: 2.94 MIL/uL — ABNORMAL LOW (ref 3.87–5.11)
RDW: 15.4 % (ref 11.5–15.5)
WBC: 9.5 10*3/uL (ref 4.0–10.5)
nRBC: 0 % (ref 0.0–0.2)

## 2023-02-27 LAB — BASIC METABOLIC PANEL
Anion gap: 7 (ref 5–15)
BUN: 28 mg/dL — ABNORMAL HIGH (ref 8–23)
CO2: 31 mmol/L (ref 22–32)
Calcium: 8.5 mg/dL — ABNORMAL LOW (ref 8.9–10.3)
Chloride: 101 mmol/L (ref 98–111)
Creatinine, Ser: 0.66 mg/dL (ref 0.44–1.00)
GFR, Estimated: >60 mL/min (ref >60)
Glucose, Bld: 144 mg/dL — ABNORMAL HIGH (ref 70–99)
Potassium: 4.9 mmol/L (ref 3.5–5.1)
Sodium: 139 mmol/L (ref 135–145)

## 2023-02-27 LAB — GLUCOSE, CAPILLARY
Glucose-Capillary: 100 mg/dL — ABNORMAL HIGH (ref 70–99)
Glucose-Capillary: 118 mg/dL — ABNORMAL HIGH (ref 70–99)
Glucose-Capillary: 106 mg/dL — ABNORMAL HIGH (ref 70–99)
Glucose-Capillary: 92 mg/dL (ref 70–99)
Glucose-Capillary: 118 mg/dL — ABNORMAL HIGH (ref 70–99)

## 2023-02-27 LAB — PHOSPHORUS: Phosphorus: 4.1 mg/dL (ref 2.5–4.6)

## 2023-02-27 LAB — MAGNESIUM: Magnesium: 2.1 mg/dL (ref 1.7–2.4)

## 2023-02-27 MED ORDER — BUSPIRONE HCL 10 MG PO TABS
20.0000 mg | ORAL_TABLET | Freq: Three times a day (TID) | ORAL | Status: DC
  Administered 2023-02-27 – 2023-03-01 (×9): 20 mg
  Filled 2023-02-27 (×10): qty 2

## 2023-02-27 MED ORDER — FUROSEMIDE 10 MG/ML IJ SOLN
40.0000 mg | Freq: Four times a day (QID) | INTRAMUSCULAR | Status: AC
  Administered 2023-02-27 (×2): 40 mg via INTRAVENOUS
  Filled 2023-02-27 (×2): qty 4

## 2023-02-27 MED ORDER — LACTATED RINGERS IV BOLUS
500.0000 mL | Freq: Once | INTRAVENOUS | Status: AC
  Administered 2023-02-27: 500 mL via INTRAVENOUS

## 2023-02-27 MED ORDER — HYDROMORPHONE HCL 2 MG PO TABS
3.0000 mg | ORAL_TABLET | ORAL | Status: DC
  Administered 2023-02-27 – 2023-02-28 (×6): 3 mg
  Filled 2023-02-27 (×6): qty 2

## 2023-02-27 MED ORDER — NOREPINEPHRINE 4 MG/250ML-% IV SOLN
INTRAVENOUS | Status: AC
  Filled 2023-02-27: qty 250

## 2023-02-27 MED ORDER — HYDROMORPHONE HCL 1 MG/ML IJ SOLN: 1.0000 mg | INTRAMUSCULAR | Status: DC | PRN

## 2023-02-27 MED ORDER — MIDAZOLAM HCL 2 MG/2ML IJ SOLN
2.0000 mg | INTRAMUSCULAR | Status: DC | PRN
  Administered 2023-02-28 – 2023-03-02 (×4): 2 mg via INTRAVENOUS
  Filled 2023-02-27 (×6): qty 2

## 2023-02-27 MED ORDER — NOREPINEPHRINE 4 MG/250ML-% IV SOLN
0.0000 ug/min | INTRAVENOUS | Status: DC
  Administered 2023-03-01: 2 ug/min via INTRAVENOUS
  Filled 2023-02-27: qty 250

## 2023-02-27 MED ORDER — HYDROMORPHONE HCL 1 MG/ML IJ SOLN
3.0000 mg | INTRAMUSCULAR | Status: DC | PRN
  Administered 2023-02-27: 3 mg via INTRAVENOUS
  Filled 2023-02-27 (×3): qty 3

## 2023-02-27 MED ORDER — ENOXAPARIN SODIUM 40 MG/0.4ML IJ SOSY
40.0000 mg | PREFILLED_SYRINGE | INTRAMUSCULAR | Status: DC
  Administered 2023-02-28 – 2023-03-02 (×3): 40 mg via SUBCUTANEOUS
  Filled 2023-02-27 (×3): qty 0.4

## 2023-02-27 MED ORDER — DEXMEDETOMIDINE HCL IN NACL 200 MCG/50ML IV SOLN
0.0000 ug/kg/h | INTRAVENOUS | Status: DC
  Administered 2023-02-27: 0.6 ug/kg/h via INTRAVENOUS
  Administered 2023-02-27: 0.06 ug/kg/h via INTRAVENOUS
  Administered 2023-02-27: 0.6 ug/kg/h via INTRAVENOUS
  Administered 2023-02-28 – 2023-03-01 (×4): 0.5 ug/kg/h via INTRAVENOUS
  Administered 2023-03-01: 0.6 ug/kg/h via INTRAVENOUS
  Administered 2023-03-01: 0.3 ug/kg/h via INTRAVENOUS
  Administered 2023-03-01: 0.5 ug/kg/h via INTRAVENOUS
  Administered 2023-03-02: 0.2 ug/kg/h via INTRAVENOUS
  Administered 2023-03-03: 0.5 ug/kg/h via INTRAVENOUS
  Administered 2023-03-03: 0.3 ug/kg/h via INTRAVENOUS
  Administered 2023-03-03: 0.5 ug/kg/h via INTRAVENOUS
  Administered 2023-03-03: 0.3 ug/kg/h via INTRAVENOUS
  Filled 2023-02-27 (×15): qty 50

## 2023-02-27 MED ORDER — HYDROMORPHONE HCL 2 MG PO TABS: 2.0000 mg | ORAL_TABLET | ORAL | Status: DC

## 2023-02-27 NOTE — Telephone Encounter (Signed)
Spoke with patients daughter. She states patient is in ICU with a trach and has been there since May. She is not sure when she will get to come home and is requesting we cancel the patients oxygen in the meantime.  Dr. Tonia Brooms are you okay with D/C oxygen?

## 2023-02-27 NOTE — Progress Notes (Signed)
Patient transported on vent to Radiation therapy and returned to #[id number] without complications.

## 2023-02-27 NOTE — Progress Notes (Signed)
eLink Physician-Brief Progress Note Patient Name: [name] DOB: 24-Feb-1956 MRN: [id number]   Date of Service  02/27/2023  HPI/Events of Note  67 year old woman with lung mass diagnosed with lung cancer subsequent respiratory failure and ventilator dependence now status post tracheostomy with ongoing ventilator dependence despite weeks in the hospital undergoing palliative radiation.   She has been diuresing today and is -1.2 L not for the day.  She developed progressive hypotension and tachycardia tonight with maps in the 40s-50s.  On examination, she is arousable, not on any sedation and has significant pulse pressure variation with her Plath waveform.  eICU Interventions  Placed the patient in reverse Trendelenburg with improvement in maps to the mid 60s.  Consistent with volume responsive state.  500 cc LR bolus.  If this is ineffective, will utilize low-dose norepinephrine as needed through her PICC to maintain MAP greater than 60.     Intervention Category Intermediate Interventions: Hypotension - evaluation and management  Abigail Marsiglia 02/27/2023, 10:56 PM

## 2023-02-27 NOTE — Telephone Encounter (Signed)
Laura Igo, DO  to Powers, [name], CMA  Lbpu Triage [facility]     02/27/23 10:17 AM Agreed BLI   I called and spoke with the pt's daughter and notified of response from Dr Tonia Brooms  Order sent to d/c o2  Nothing further needed

## 2023-02-27 NOTE — Progress Notes (Signed)
NAME:  [name], MRN:  [id number], DOB:  1955-12-28, LOS: 46 ADMISSION DATE:  01/11/2023, CONSULTATION DATE:  01/16/2023 REFERRING MD: FMTS, CHIEF COMPLAINT: Acute Respiratory Failure in the setting of Lung Cancer, COPD and infiltrate on CXR   History of Present Illness:  67 year old woman, former smoker who presented to [facility] ED 5/1 with nausea, vomiting, diarrhea and abdominal pain for 2 weeks.  Found to have RML and RLL post-obstructive pneumonia with Rt hilar mass, new onset A fib.  She required intubation on 5/06 for hypoxic respiratory failure.  Transferred to [facility] on 5/07 for cancer therapy.  Pertinent Medical History:  COPD, Adenocarcinoma LLL November 2021, Bladder cancer, GERD, Anxiety with panic attacks, Depression, Allergies  Significant Hospital Events: Including procedures, antibiotic start and stop dates in addition to other pertinent events   5/6 Transferred to ICU for acute Respiratory Failure , intubated  5/7 Bronch by Icard. significant extrinsic compression of the airways in the right lower lobe and middle lobe. This is likely malignant obstruction. Bx sent. Transferred to [facility] for rad/onc eval. PICC placed 5/8 Stable still high PEEP/FiO2 needs. Rad/onc consult requested  5/9 start radiation therapy 5/10 Tolerating SBT trial this AM on low-dose fentanyl and Precedex.  Propofol DC'd due to high triglycerides 5/11 Tolerates SBT's, pressure support 10/5 5/12 Off of pressors. Remains on vent, sedated with Precedex/Fentanyl. PMT consulted. 5/13 Weaning on PSV 14/8. > 10L positive over the course of admission. Diuresing. 5/14 Weaning on PSV. Ongoing diuresis. 5/15 Tolerating SBT, PSV 5/5, following commands. Continue diuresis, Foley placed for persistent urinary retention. Extubated, decompensated quickly after requiring reintubation. DHT placed. 5/16 Agitated overnight, c/o throat pain with ETT, RASS 1-2 despite sedation. Failed extubation. Tolerating SBT PSV 10/5, FiO2 35%.  ~1630 desaturated with inability to pass Ballard suction/difficulty bagging; urgent tube exchange completed with bougie and blood clot removed with improvement in oxygenation/ventilation. 5/17 Tracheostomy placed 5/19 Back in atrial fibrillation in am, HR 150s 5/20 Weaned on pressure support 10/5 5/21 Desaturated during weaning. Still on dex and fent gtt 5/23 added fent patch and seroquel had to have NE gtt and midodrine started for drug related hypotension 5/24 Trialing fent gtt off. Foley removed 5/25 3 hrs. trach collar 5/26 Pressure support minimal trach collar 5/28 SIMV/PS, increased pain. Changed oxy fent patch to dilaudid incr seroquel 5/29 radiation. Off dilaudid gtt. Lower dose precedex requirement  5/30 try to wean precedex. Repeat anxiety during/after rad. Incr anxiolytics 6/01 change to pressure control as rest mode 6/4 trial vent valve and able to phonate, trach collar trial for ~1-2 hours surprisingly and wore PMV for 5 minutes and able to phonate well  6/6 intermittent plugging, underwent bronch but required surprisingly little therapeutic aspiration  6/6-6/7 required 3U pRBC transfusion total without clear source of blood loss. Therapeutic AC has been held in advance of g-tube placement on 6/10, remains on ppx lovenox. 6/10 CT abdomen concerning for new nodular lesions in the soft tissue and retroperitoneal space. 6/12 Tolerated TCT with PMV in place x 1 hour, time-limited by anxiety. 6/13 XRT session. Transiently hypotensive overnight, Levo ordered but not started. 6/14 XRT session.  Increased anxiety >> added diprivan and continue precedex 6/17 Enteral pain and anxiety regiment optimized with goal to discontinue propofol  Interim History / Subjective:  Lightly sedated on ventilator  Objective:  Blood pressure (!) 142/116, pulse (!) 108, temperature 100.2 F (37.9 C), temperature source Axillary, resp. rate (!) 23, height 5\' 8"  (1.727 m), weight 97.7 kg, SpO2 99 %.  Vent Mode: PCV FiO2 (%):  [60 %] 60 % Set Rate:  [24 bmp] 24 bmp PEEP:  [5 cmH20] 5 cmH20 Plateau Pressure:  [20 cmH20-23 cmH20] 23 cmH20   Intake/Output Summary (Last 24 hours) at 02/27/2023 0943 Last data filed at 02/27/2023 1610 Gross per 24 hour  Intake 1807.57 ml  Output 1775 ml  Net 32.57 ml    Filed Weights   02/22/23 0500 02/23/23 0500 02/27/23 0438  Weight: 91.1 kg 92.9 kg 97.7 kg   Physical Examination: General: Acute on chronic ill-appearing elderly female lying in bed on mechanical ventilation via trach in no acute distress HEENT: Trach midline, MM pink/moist, PERRL,  Neuro: Will arouse to verbal and physical stimuli, nonfocal CV: s1s2 regular rate and rhythm, no murmur, rubs, or gallops,  PULM: Clear to auscultation bilaterally, no increased work of breathing, no added breath sounds GI: soft, bowel sounds active in all 4 quadrants, non-tender, non-distended, tolerating TF Extremities: warm/dry, no edema  Skin: no rashes or lesions  Resolved Hospital Problem List:  Hyponatremia , Hypotension drug induced from sedation. Vocal cord edema, Post obstructive PNA: Zosyn completed 5/13, Hypotension from hypovolemia and sedation  Assessment & Plan:   Acute hypoxic respiratory failure in setting of NSCLC, post obstructive pneumonia, and mucus plugging with atelectasis. Failure to wean from ventilator s/p tracheostomy. COPD with emphysema. P: SBT trials have been limited by significant anxiety thus far Continue ventilator support with lung protective strategies  Wean PEEP and FiO2 for sats greater than 90%. Head of bed elevated 30 degrees. Plateau pressures less than 30 cm H20.  Follow intermittent chest x-ray and ABG.   SAT/SBT as tolerated, mentation preclude extubation  Ensure adequate pulmonary hygiene  Follow cultures  VAP bundle in place  PAD protocol XRT treatments coming to an end will need to look into LTAC options. Follow sputum culture obtained 6/15    Rt hilar mass from NSCLC with hx of LLL adenocarcinoma in November 2021. - should complete XRT week of 6/17 - concern she has new metastatic lesions seen on CT abd/pelvis from 6/09 - seen by Dr. Arbutus Ped >> would need to recover from respiratory failure before being a candidate for systemic chemotherapy P: Continue XRT treatment Need ongoing goals of care, likely need to reengage palliative care  Acute metabolic encephalopathy from sepsis and hypoxia. Depression with anxiety. Cancer pain. P: Stop propofol, continue Precedex As needed Versed pushes Increase BuSpar to 3 times daily Add oral and IV opioids Continue Seroquel, Celexa, and Neurontin   Paroxysmal atrial fibrillation. Hx of HTN, HLD. P: Telemetry Continue statin Remains on DVT dose of Lovenox Hold home Eliquis  Dysphagia. - IR to follow up week of 6/17 and arrange for PEG P: Continue NG tube for now, discussed goals of care prior to PEG tube placement   Anemia of critical illness and chronic disease. P: Trend CBC Transfuse per protocol Hemoglobin goal greater than 7  Urine retention. - foley d/c'ed 6/10 P: Continue your colchicine  Physical deconditioning P: PT/OT as able TOC following for placement needs  Goals of care - Revisited on 6/4, continuing current measures including goal to complete planned course of XRT.  Will reengage goals of care discussion this week as XRT therapy is due to end and images reveal likely progressive disease - PMT consulted  Best Practice: (right click and "Reselect all SmartList Selections" daily)   Diet/type: tubefeeds DVT prophylaxis: Lovenox, hold Eliquis GI prophylaxis: H2B Lines: Central line - PICC Foley:  No Code  Status:  full code Last date of multidisciplinary goals of care discussion: Continue to update patient and family daily  Signature:   Performed by: Atlee Kluth D. Harris  Total critical care time: 37 minutes  Critical care time was exclusive of  separately billable procedures and treating other patients.  Critical care was necessary to treat or prevent imminent or life-threatening deterioration.  Critical care was time spent personally by me on the following activities: development of treatment plan with patient and/or surrogate as well as nursing, discussions with consultants, evaluation of patient's response to treatment, examination of patient, obtaining history from patient or surrogate, ordering and performing treatments and interventions, ordering and review of laboratory studies, ordering and review of radiographic studies, pulse oximetry and re-evaluation of patient's condition.  Nickholas Goldston D. Harris, NP-C [facility] Personal contact information can be found on Amion  If no contact or response made please call [phone] 02/27/2023, 9:50 AM

## 2023-02-27 NOTE — Progress Notes (Signed)
Occupational Therapy Treatment Patient Details Name: [name] MRN: [id number] DOB: 1956/04/08 Today's Date: 02/27/2023   History of present illness 67 year old woman who presented to [facility] 5/1 for nausea/vomiting/diarrhea and abdominal pain x 2 weeks.  Found to have RML and RLL post-obstructive pneumonia with Rt hilar mass, new onset A fib.  She required intubation on 5/06 for hypoxic respiratory failure.  Transferred to [facility] on 5/07 for cancer therapy.  Pt currently with failure to wean from ventilator s/p tracheostomy and should finish XRT (for NSCLC) next week. PMHx significant for COPD, lung CA (adenocarcinoma of LLL, biopsy planned for 5/7), bladder CA (s/p TURBT), GERD, and panic/anxiety.   OT comments  Patient reporting increased pain upon entrance to room with nurse providing pain medications. As session progressed patient increased in lethargy. Patient participated in [place] of BUE and positioning of BUE to reduce edema. Patient's discharge plan remains appropriate at this time. OT will continue to follow acutely.     Recommendations for follow up therapy are one component of a multi-disciplinary discharge planning process, led by the attending physician.  Recommendations may be updated based on patient status, additional functional criteria and insurance authorization.    Assistance Recommended at Discharge Frequent or constant Supervision/Assistance  Patient can return home with the following  Two people to help with bathing/dressing/bathroom;Two people to help with walking and/or transfers;Assistance with cooking/housework;Assist for transportation;Help with stairs or ramp for entrance;Direct supervision/assist for financial management         Precautions / Restrictions Precautions Precautions: Fall Precaution Comments: monitor vitals, NG feeding tube, trach to vent Restrictions Weight Bearing Restrictions: No              ADL either performed or assessed with clinical  judgement      Cognition Arousal/Alertness: Suspect due to medications, Lethargic Behavior During Therapy: Flat affect Overall Cognitive Status: Difficult to assess         General Comments: patient was able to non and shake head to answer questions, recent administration of pain medications with patient more lethargic as session went on        Exercises General Exercises - Upper Extremity Shoulder Flexion: Both, 10 reps, AAROM, Supine Elbow Flexion: AAROM, 10 reps Wrist Flexion: 5 reps, AAROM (on R side. L side with h/o surgical intevention and limited ROM) Wrist Extension: AAROM, 5 reps (on R side. L side with h/o surgical intevention and limited ROM) Other Exercises Other Exercises: had patient lean forwards in bed with grip on bilateral bed rails for scratching of back with wash cloth. with mod A for sitting balance in chair position in bed.            Pertinent Vitals/ Pain       Pain Assessment Pain Assessment: Faces Faces Pain Scale: Hurts a little bit Pain Location: with some movements of BUE and BLE but shake head no when asked about pain Pain Descriptors / Indicators: Discomfort, Grimacing Pain Intervention(s): Monitored during session, Premedicated before session         Frequency  Min 1X/week        Progress Toward Goals  OT Goals(current goals can now be found in the care plan section)  Progress towards OT goals: Not progressing toward goals - comment (patient has been limited with varying levels of lethargy and anxiousness.)     Plan Discharge plan remains appropriate;Frequency remains appropriate    Co-evaluation    PT/OT/SLP Co-Evaluation/Treatment: Yes Reason for Co-Treatment: Complexity of the patient's impairments (multi-system  involvement);For patient/therapist safety;To address functional/ADL transfers PT goals addressed during session: Mobility/safety with mobility;Proper use of DME OT goals addressed during session: ADL's and  self-care      AM-PAC OT "6 Clicks" Daily Activity     Outcome Measure   Help from another person eating meals?: Total Help from another person taking care of personal grooming?: A Lot Help from another person toileting, which includes using toliet, bedpan, or urinal?: A Lot Help from another person bathing (including washing, rinsing, drying)?: A Lot Help from another person to put on and taking off regular upper body clothing?: A Lot Help from another person to put on and taking off regular lower body clothing?: A Lot 6 Click Score: 11    End of Session Equipment Utilized During Treatment: Oxygen  OT Visit Diagnosis: Other abnormalities of gait and mobility (R26.89);Muscle weakness (generalized) (M62.81)   Activity Tolerance Patient tolerated treatment well   Patient Left in bed;with bed alarm set;with call bell/phone within reach   Nurse Communication Mobility status        Time: 1308-6578 OT Time Calculation (min): 24 min  Charges: OT General Charges $OT Visit: 1 Visit OT Treatments $Therapeutic Activity: 8-22 mins  Rosalio Loud, MS Acute Rehabilitation Department Office# [phone]   Selinda Flavin 02/27/2023, 10:42 AM

## 2023-02-27 NOTE — IPAL (Signed)
  Interdisciplinary Goals of Care Family Meeting   Date carried out: 02/27/2023  Location of the meeting: Phone conference  Member's involved: Nurse Practitioner and Family Member or next of kin  Durable Power of Attorney or acting medical decision maker: [name] - Daughter   Discussion: Called and spoke to [place] for daily update, she verbalized she understands patients clinical projection. She states she is aware that repeat CT shows likely progression of disease. She wishes to continue current treatment until XRT therapy has been completed (projected to be completed 6/20) after which she would like to speak with Oncologist to determine if patient is a candidate for chemotherapy. If not she would like to discuss next steps such as possible need for palliative care.  Code status: Full Code  Disposition: Continue current acute care  Time spent for the meeting: 25 mins  Zarria Towell D. Harris 02/27/2023, 2:32 PM

## 2023-02-27 NOTE — Progress Notes (Signed)
Physical Therapy Treatment Patient Details Name: [name] MRN: [id number] DOB: 02-Jul-1956 Today's Date: 02/27/2023   History of Present Illness 67 year old woman who presented to [facility] 5/1 for nausea/vomiting/diarrhea and abdominal pain x 2 weeks.  Found to have RML and RLL post-obstructive pneumonia with Rt hilar mass, new onset A fib.  She required intubation on 5/06 for hypoxic respiratory failure.  Transferred to [facility] on 5/07 for cancer therapy.  Pt currently with failure to wean from ventilator s/p tracheostomy and should finish XRT (for NSCLC) next week. PMHx significant for COPD, lung CA (adenocarcinoma of LLL, biopsy planned for 5/7), bladder CA (s/p TURBT), GERD, and panic/anxiety.    PT Comments    The  patient was given Dilaudid prior to Therapy, patient was  limited in arousal , assisted patient  in bed chair position and assisted patient to sit upright for ~ . Continue PT.  Patient currently on vent, HR up to 123.  Recommendations for follow up therapy are one component of a multi-disciplinary discharge planning process, led by the attending physician.  Recommendations may be updated based on patient status, additional functional criteria and insurance authorization.  Follow Up Recommendations  Can patient physically be transported by private vehicle: No    Assistance Recommended at Discharge Frequent or constant Supervision/Assistance  Patient can return home with the following Assist for transportation;Help with stairs or ramp for entrance;Assistance with cooking/housework;Two people to help with walking and/or transfers;Two people to help with bathing/dressing/bathroom   Equipment Recommendations  None recommended by PT    Recommendations for Other Services       Precautions / Restrictions Precautions Precautions: Fall Precaution Comments: monitor vitals, NG feeding tube, trach to vent Restrictions Weight Bearing Restrictions: No     Mobility  Bed  Mobility Overal bed mobility: Needs Assistance             General bed mobility comments: HOB raised to 60 and lgs lowered  to place patientrin a bed chair position, assisted placing each hand on bed rail, patient gripped rail, assisted to lean forward for ~ 2 minutes with mod external support to  remain unsupported at the back.    Transfers                        Ambulation/Gait                   Stairs             Wheelchair Mobility    Modified Rankin (Stroke Patients Only)       Balance Overall balance assessment: Needs assistance Sitting-balance support: Feet unsupported   Sitting balance - Comments: seated in bed chair position with HOB removed for support xx 2 mins                                    Cognition Arousal/Alertness: Suspect due to medications, Lethargic Behavior During Therapy: Flat affect Overall Cognitive Status: Difficult to assess                                 General Comments: patient was able to nod and shake head to answer questions, recent administration of pain medications with patient more lethargic as session went on        Exercises General Exercises - Lower Extremity Ankle  Circles/Pumps: Both, 15 reps, Supine, AAROM Long Arc Quad: AAROM, Both, 10 reps, Seated    General Comments        Pertinent Vitals/Pain Pain Assessment Faces Pain Scale: Hurts a little bit Pain Location: with some movements of BUE and BLE but shake head no when asked about pain Pain Descriptors / Indicators: Discomfort, Grimacing Pain Intervention(s): Premedicated before session    Home Living                          Prior Function            PT Goals (current goals can now be found in the care plan section) Progress towards PT goals: Progressing toward goals    Frequency    Min 1X/week      PT Plan Current plan remains appropriate    Co-evaluation PT/OT/SLP  Co-Evaluation/Treatment: Yes Reason for Co-Treatment: Complexity of the patient's impairments (multi-system involvement);For patient/therapist safety;To address functional/ADL transfers PT goals addressed during session: Mobility/safety with mobility;Proper use of DME OT goals addressed during session: ADL's and self-care      AM-PAC PT "6 Clicks" Mobility   Outcome Measure  Help needed turning from your back to your side while in a flat bed without using bedrails?: A Lot Help needed moving from lying on your back to sitting on the side of a flat bed without using bedrails?: A Lot Help needed moving to and from a bed to a chair (including a wheelchair)?: Total Help needed standing up from a chair using your arms (e.g., wheelchair or bedside chair)?: Total Help needed to walk in hospital room?: Total Help needed climbing 3-5 steps with a railing? : Total 6 Click Score: 8    End of Session   Activity Tolerance: Patient limited by lethargy Patient left: in bed;with call bell/phone within reach;with bed alarm set Nurse Communication: Mobility status;Need for lift equipment PT Visit Diagnosis: Muscle weakness (generalized) (M62.81)     Time: 1610-9604 PT Time Calculation (min) (ACUTE ONLY): 26 min  Charges:  $Therapeutic Activity: 8-22 mins                     Blanchard Kelch PT Acute Rehabilitation Services Office [phone] Weekend [phone]    Rada Hay 02/27/2023, 12:29 PM

## 2023-02-28 ENCOUNTER — Other Ambulatory Visit: Payer: Self-pay

## 2023-02-28 ENCOUNTER — Inpatient Hospital Stay (HOSPITAL_COMMUNITY): Payer: 59

## 2023-02-28 ENCOUNTER — Telehealth: Payer: Self-pay | Admitting: Family Medicine

## 2023-02-28 ENCOUNTER — Ambulatory Visit
Admission: RE | Admit: 2023-02-28 | Discharge: 2023-02-28 | Disposition: A | Discharge status: Home / Self Care | Payer: 59 | Source: Ambulatory Visit | Attending: Radiation Oncology | Admitting: Radiation Oncology

## 2023-02-28 DIAGNOSIS — Z87891 Personal history of nicotine dependence: Secondary | ICD-10-CM | POA: Diagnosis not present

## 2023-02-28 DIAGNOSIS — J9601 Acute respiratory failure with hypoxia: Secondary | ICD-10-CM | POA: Diagnosis not present

## 2023-02-28 DIAGNOSIS — Z510 Encounter for antineoplastic radiation therapy: Secondary | ICD-10-CM | POA: Diagnosis not present

## 2023-02-28 DIAGNOSIS — R918 Other nonspecific abnormal finding of lung field: Secondary | ICD-10-CM | POA: Diagnosis not present

## 2023-02-28 DIAGNOSIS — C3432 Malignant neoplasm of lower lobe, left bronchus or lung: Secondary | ICD-10-CM | POA: Diagnosis not present

## 2023-02-28 DIAGNOSIS — J988 Other specified respiratory disorders: Secondary | ICD-10-CM | POA: Diagnosis not present

## 2023-02-28 DIAGNOSIS — C3431 Malignant neoplasm of lower lobe, right bronchus or lung: Secondary | ICD-10-CM | POA: Diagnosis not present

## 2023-02-28 LAB — BASIC METABOLIC PANEL
Anion gap: 10 (ref 5–15)
BUN: 32 mg/dL — ABNORMAL HIGH (ref 8–23)
CO2: 31 mmol/L (ref 22–32)
Calcium: 8.8 mg/dL — ABNORMAL LOW (ref 8.9–10.3)
Chloride: 99 mmol/L (ref 98–111)
Creatinine, Ser: 0.86 mg/dL (ref 0.44–1.00)
GFR, Estimated: >60 mL/min (ref >60)
Glucose, Bld: 125 mg/dL — ABNORMAL HIGH (ref 70–99)
Potassium: 4.6 mmol/L (ref 3.5–5.1)
Sodium: 140 mmol/L (ref 135–145)

## 2023-02-28 LAB — GLUCOSE, CAPILLARY
Glucose-Capillary: 114 mg/dL — ABNORMAL HIGH (ref 70–99)
Glucose-Capillary: 116 mg/dL — ABNORMAL HIGH (ref 70–99)
Glucose-Capillary: 121 mg/dL — ABNORMAL HIGH (ref 70–99)
Glucose-Capillary: 123 mg/dL — ABNORMAL HIGH (ref 70–99)
Glucose-Capillary: 129 mg/dL — ABNORMAL HIGH (ref 70–99)
Glucose-Capillary: 152 mg/dL — ABNORMAL HIGH (ref 70–99)
Glucose-Capillary: 167 mg/dL — ABNORMAL HIGH (ref 70–99)

## 2023-02-28 LAB — RAD ONC ARIA SESSION SUMMARY
Course Elapsed Days: 41
Plan Fractions Treated to Date: 26
Plan Prescribed Dose Per Fraction: 2 Gy
Plan Total Fractions Prescribed: 27
Plan Total Prescribed Dose: 54 Gy
Reference Point Dosage Given to Date: 52 Gy
Reference Point Session Dosage Given: 2 Gy
Session Number: 29

## 2023-02-28 MED ORDER — ALTEPLASE 2 MG IJ SOLR
2.0000 mg | Freq: Once | INTRAMUSCULAR | Status: AC
  Administered 2023-02-28: 2 mg
  Filled 2023-02-28: qty 2

## 2023-02-28 MED ORDER — HYDROMORPHONE HCL 1 MG/ML IJ SOLN
2.0000 mg | INTRAMUSCULAR | Status: DC | PRN
  Administered 2023-03-01 – 2023-03-03 (×11): 2 mg via INTRAVENOUS
  Filled 2023-02-28 (×11): qty 2

## 2023-02-28 MED ORDER — HYDROMORPHONE HCL 2 MG PO TABS
2.0000 mg | ORAL_TABLET | ORAL | Status: DC
  Administered 2023-03-01 – 2023-03-02 (×7): 2 mg
  Filled 2023-02-28 (×7): qty 1

## 2023-02-28 MED ORDER — FENTANYL CITRATE PF 50 MCG/ML IJ SOSY
25.0000 ug | PREFILLED_SYRINGE | INTRAMUSCULAR | Status: AC | PRN
  Administered 2023-02-28: 100 ug via INTRAVENOUS
  Filled 2023-02-28: qty 2

## 2023-02-28 MED ORDER — FENTANYL 2500MCG IN NS 250ML (10MCG/ML) PREMIX INFUSION
0.0000 ug/h | INTRAVENOUS | Status: DC
  Administered 2023-02-28: 25 ug/h via INTRAVENOUS
  Filled 2023-02-28: qty 250

## 2023-02-28 MED ORDER — ACETAMINOPHEN 160 MG/5ML PO SOLN
650.0000 mg | Freq: Four times a day (QID) | ORAL | Status: DC | PRN
  Administered 2023-02-28: 650 mg
  Filled 2023-02-28: qty 20.3

## 2023-02-28 NOTE — Progress Notes (Signed)
RT note: This RT notified by RN @ 2031 that pt. becoming more S.O.B. with Oxygen saturations dropping, HR elevated to~140's and being noted to be more short of breath/anxious than she was on initial visual assessment. Upon this RT 's arrival pt. was given 100% Fi02 breaths via Vent while obtaining AMBU bag, EtC02 Cap, Trach cuff balloon remained up with no audible leak heard via auscultation, b/l b.s. decreased b/l Coarse, RN aware, in room, pt. taken off vent and was bagged w/o difficulty before/after EtC02 Cap being placed which showed (+) for adequate placed/no dislodgment. I/E Vt's were noted to be <'d somewhat due to >'d RR=>40's, prior to be taken off and manually bagged,=~300's, Charge RN arrived, pt. Placed back on vent, scheduled Brovana/Pulmicort obtained given via Aerogen Neb on vent, sx'd for Moderate # dark Mucus, not likely resembling tube feeds which were placed on hold prior with this incident. Prior inline sx., HME both changed out with tubing cleared with NSS, new EtC02 Cap obtained for pts. Room, Trach cuff maintained @ MOV, CXR ordered, RT/RN to monitor.

## 2023-02-28 NOTE — Progress Notes (Signed)
   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 Patient was unable to speak loudly. Patient informed chaplain that she is [facility] and that she has 2 children. Patient was receptive to prayer. Chaplain/chaplain team will continue to follow patient.   Arlyce Dice, Chaplain Resident

## 2023-02-28 NOTE — Telephone Encounter (Signed)
Late entry.  Visited Ms. [name] in her hospital room at [facility], Monday 6/17.  She seemed to be in good spirits.  She asked about her rings from her fingers, I told her I would ask her daughter.  Today, called daughter [name] with an update.  Relayed that [name] wanted to know where her rings were and that her phone at bedside was nearly out of battery and needed a charger.  Will continue to follow via chart.   Fayette Pho, MD

## 2023-02-28 NOTE — Progress Notes (Signed)
Nutrition Follow-up  DOCUMENTATION CODES:   Severe malnutrition in context of acute illness/injury  INTERVENTION:   Osmolite 1.5 @ 55 ml/hr (1320 ml/day) PROSource TF20 60 ml daily - Provides 2060 kcal, 103 grams of protein, and 1006 ml of H2O.   - FWF per CCM/MD.   - MVI with minerals daily per tube   - Monitor bowel movements.    - Monitor weight trends.   NUTRITION DIAGNOSIS:   Severe Malnutrition related to acute illness as evidenced by moderate fat depletion, moderate muscle depletion. *ongoing  GOAL:   Patient will meet greater than or equal to 90% of their needs *met with TF  MONITOR:   Vent status, Labs, Weight trends, TF tolerance  REASON FOR ASSESSMENT:   Ventilator, Consult Enteral/tube feeding initiation and management  ASSESSMENT:   67 y.o. female with PMHx including adenocarcinoma of LLL now with recurrence, COPD, and anxiety presents with acute on chronic hypoxemic respiratory failure likely multifactorial  5/2 Admit 5/6 Intubated; Osm 1.5 started at 27mL/hr 5/8 Osm 1.5 increased to goal of 49mL/hr 5/15 Extubated by quickly re-intubated; TF restarted at 68mL/hr due to xray showing stomach distension 5/17 s/p trach 6/10 scheduled for IR placement of G-tube, aborted due to ileus and bowel migrating to anterior to the stomach  Patient had just been taken to radiation at time of visit this AM.   She continues to tolerate goal TF. NGT remains in place. Last abdominal xray from 6/14 indicates patient remains in stomach, nonspecific bowel gas pattern noted.  G-tube attempted by IR on 6/10 but was aborted. There has been no IR note since that time about when this could be attempted again.  Diarrhea appears to be decreasing, last BM noted to be 6/15. No bowel regimen ordered. Will continue to monitor.    Weight continues to trend up: Date/Time Weight in lbs Net I/O since 6/4  02/28/23 0500 209 lbs +13.4L  02/27/23 0438 215.39 lbs +13.9L  02/23/23 0500  204.81 lbs +11.3L  02/22/23 0500 200.84 lbs +9.8L  02/21/23 0500 198.63 lbs   02/19/23 0355 197.31 lbs +8.6L  02/17/23 0427 190.92 lbs +3.7L  02/14/23 0500 185.19 lbs   02/09/23 0500 180.34 lbs   02/08/23 0335 175.71 lbs   02/02/23 0500 177.47 lbs    01/28/23 0422 172.62 lbs    01/26/23 0401 183.86 lbs    01/25/23 0419 179.68 lbs    01/24/23 0702 181 lbs    01/23/23 0500 194.89 lbs    01/22/23 0500 188.71 lbs    01/21/23 0500 184.3 lbs    01/19/23 0703 172.62 lbs    01/11/23 1658 174.6 lbs    Weight likely elevated due to ongoing generalized edema and overall fluid status. Last Net I/O since 5/28 had patient at +13.9 on 6/11 so it appears she continues to be grossly positive. Do not feel patient is gaining true body weight but will continue to monitor for need to adjust TF.  Medications reviewed and include: 300mg  ferrous sulfate, MVI, Remeron  Labs reviewed:  -   Diet Order:   Diet Order             Diet NPO time specified  Diet effective now                   EDUCATION NEEDS:  Education needs have been addressed  Skin:  Skin Assessment: Skin Integrity Issues: Skin Integrity Issues:: Other (Comment) Other: Skin tear on right back  Last BM:  6/15 -  rectal tube  Height: Ht Readings from Last 1 Encounters:  01/31/23 5\' 8"  (1.727 m)   Weight:  Wt Readings from Last 1 Encounters:  02/28/23 94.8 kg    BMI:  Body mass index is 31.78 kg/m.  Estimated Nutritional Needs:  Kcal:  1900-2100 Protein:  95-120 g Fluid:  >1.9 L    Shelle Iron RD, LDN For contact information, refer to [facility].

## 2023-02-28 NOTE — Progress Notes (Signed)
NAME:  [name], MRN:  [id number], DOB:  06-23-1956, LOS: 47 ADMISSION DATE:  01/11/2023, CONSULTATION DATE:  01/16/2023 REFERRING MD: FMTS, CHIEF COMPLAINT: Acute Respiratory Failure in the setting of Lung Cancer, COPD and infiltrate on CXR   History of Present Illness:  67 year old woman, former smoker who presented to [facility] ED 5/1 with nausea, vomiting, diarrhea and abdominal pain for 2 weeks.  Found to have RML and RLL post-obstructive pneumonia with Rt hilar mass, new onset A fib.  She required intubation on 5/06 for hypoxic respiratory failure.  Transferred to [facility] on 5/07 for cancer therapy.  Pertinent Medical History:  COPD, Adenocarcinoma LLL November 2021, Bladder cancer, GERD, Anxiety with panic attacks, Depression, Allergies  Significant Hospital Events: Including procedures, antibiotic start and stop dates in addition to other pertinent events   5/6 Transferred to ICU for acute Respiratory Failure , intubated  5/7 Bronch by Icard. significant extrinsic compression of the airways in the right lower lobe and middle lobe. This is likely malignant obstruction. Bx sent. Transferred to [facility] for rad/onc eval. PICC placed 5/8 Stable still high PEEP/FiO2 needs. Rad/onc consult requested  5/9 start radiation therapy 5/10 Tolerating SBT trial this AM on low-dose fentanyl and Precedex.  Propofol DC'd due to high triglycerides 5/11 Tolerates SBT's, pressure support 10/5 5/12 Off of pressors. Remains on vent, sedated with Precedex/Fentanyl. PMT consulted. 5/13 Weaning on PSV 14/8. > 10L positive over the course of admission. Diuresing. 5/14 Weaning on PSV. Ongoing diuresis. 5/15 Tolerating SBT, PSV 5/5, following commands. Continue diuresis, Foley placed for persistent urinary retention. Extubated, decompensated quickly after requiring reintubation. DHT placed. 5/16 Agitated overnight, c/o throat pain with ETT, RASS 1-2 despite sedation. Failed extubation. Tolerating SBT PSV 10/5, FiO2 35%.  ~1630 desaturated with inability to pass [place] suction/difficulty bagging; urgent tube exchange completed with bougie and blood clot removed with improvement in oxygenation/ventilation. 5/17 Tracheostomy placed 5/19 Back in atrial fibrillation in am, HR 150s 5/20 Weaned on pressure support 10/5 5/21 Desaturated during weaning. Still on dex and fent gtt 5/23 added fent patch and seroquel had to have NE gtt and midodrine started for drug related hypotension 5/24 Trialing fent gtt off. Foley removed 5/25 3 hrs. trach collar 5/26 Pressure support minimal trach collar 5/28 SIMV/PS, increased pain. Changed oxy fent patch to dilaudid incr seroquel 5/29 radiation. Off dilaudid gtt. Lower dose precedex requirement  5/30 try to wean precedex. Repeat anxiety during/after rad. Incr anxiolytics 6/01 change to pressure control as rest mode 6/4 trial vent valve and able to phonate, trach collar trial for ~1-2 hours surprisingly and wore PMV for 5 minutes and able to phonate well  6/6 intermittent plugging, underwent bronch but required surprisingly little therapeutic aspiration  6/6-6/7 required 3U pRBC transfusion total without clear source of blood loss. Therapeutic AC has been held in advance of g-tube placement on 6/10, remains on ppx lovenox. 6/10 CT abdomen concerning for new nodular lesions in the soft tissue and retroperitoneal space. 6/12 Tolerated TCT with PMV in place x 1 hour, time-limited by anxiety. 6/13 XRT session. Transiently hypotensive overnight, Levo ordered but not started. 6/14 XRT session.  Increased anxiety >> added diprivan and continue precedex 6/17 Enteral pain and anxiety regiment optimized with goal to discontinue propofol  Interim History / Subjective:  Tmax 100 Hypotensive overnight, responsive to IVF bolus Worsening tachycardia/ anxiety this am, denies pain or SOB  Objective:  Blood pressure 110/63, pulse (!) 120, temperature 99.8 F (37.7 C), temperature source  Rectal, resp.  rate (!) 36, height 5\' 8"  (1.727 m), weight 94.8 kg, SpO2 98 %.    Vent Mode: PCV FiO2 (%):  [60 %] 60 % Set Rate:  [24 bmp] 24 bmp PEEP:  [5 cmH20] 5 cmH20 Plateau Pressure:  [11 cmH20-21 cmH20] 11 cmH20   Intake/Output Summary (Last 24 hours) at 02/28/2023 0904 Last data filed at 02/28/2023 1610 Gross per 24 hour  Intake 1996.84 ml  Output 2500 ml  Net -503.16 ml   Filed Weights   02/23/23 0500 02/27/23 0438 02/28/23 0500  Weight: 92.9 kg 97.7 kg 94.8 kg   Physical Examination: General:  AoC ill appearing elderly female, anxious appearing on vent HEENT: MM pink/moist, cortrak, pupils 4/r, midline trach Neuro: Awake, follows commands, nods but at times has some phonation/ sounds around trach, MAE- generally weak CV: ST 130s, no murmur PULM:  tachypneic over full MV, coarse and diminished GI: protuberant/ ?mildly distended but soft, NT, +bs, purwick, FMS Extremities: warm/dry, generalized, +3 LE  UOP 2.5L/ 24hrs  Net +13.4L BMET pending  Resolved Hospital Problem List:  Hyponatremia , Hypotension drug induced from sedation. Vocal cord edema, Post obstructive PNA: Zosyn completed 5/13, Hypotension from hypovolemia and sedation  Assessment & Plan:   Acute hypoxic respiratory failure in setting of NSCLC, post obstructive pneumonia, and mucus plugging with atelectasis. Failure to wean from ventilator s/p tracheostomy. COPD with emphysema. P: - cont full MV support, PCV - scheduled to complete palliative XRT 6/20 - has not been able to tolerate any weaning trials in some time and or efforts in decreasing FiO2/ vent support given anxiety issues, underlying lung malignancy, and generalized weakness 2/2 critical/ prolonged illness/ failure to thrive.  Possibly will need vent/ snf vs LTAC - intermittent CXR/ ABG - cont BD, aggressive pulm hygiene - hold further diureses given hypotension overnight - VAP/ PPI - cont trach care  - pending trach asp > no reported  changes in amount/ color - cont PAD protocol> see below.  Hopeful to minimize as able, in hopes to make some progression on vent needs   Rt hilar mass from NSCLC with hx of LLL adenocarcinoma in November 2021. - should complete XRT 6/20 - concern she has new metastatic lesions seen on CT abd/pelvis from 6/09 - seen by Dr. Arbutus Ped 6/4 >> would need to recover from respiratory failure before being a candidate for systemic chemotherapy P: - Continue palliative XRT treatment - see IPAL note 6/17> daughter would like to speak again with oncology after completion of XRT to determine if candidate for chemotherapy.    Acute metabolic encephalopathy from sepsis and hypoxia. Depression with anxiety. Cancer pain. P: - cont precedex, prn versed, klonopin 2mg  q8hrs - cont buspar (increased 6/17), seroquel, celexa, and Neurontin  - cont dilaudid 3mg  q4hrs and prn for breakthrough pain   Paroxysmal atrial fibrillation. Hx of HTN, HLD. P: - remains in ST, monitor on tele - holding eliquis - hypotensive overnight, responsive to fluids> , stable this am.  Diuresed 6/17.  Hold further diuresis today, pending BMET - goal MAP > 65 - cont statin  Dysphagia. - IR to follow up week of 6/17 and arrange for PEG after resolution of ileus P: - TF per cortrak pending IR re-evaluation   Anemia of critical illness and chronic disease. P: - trend CBC, transfuse for Hgb < 7  Urine retention. - foley d/c'ed 6/10 P: - monitor  Physical deconditioning P: - PT/OT as able - TOC following for placement needs  Goals of  care - Revisited on 6/4, continuing current measures including goal to complete planned course of XRT.  Will reengage goals of care discussion this week as XRT therapy is due to end and images reveal likely progressive disease - PMT consulted 5/12.  Will reach out to Dr. Arbutus Ped again to see if he would be willing to speak/ reach out to daughter again. Pending his recommendations, will  likely need to re-engage PMT.   Best Practice: (right click and "Reselect all SmartList Selections" daily)   Diet/type: tubefeeds DVT prophylaxis: Lovenox, hold Eliquis GI prophylaxis: H2B Lines: Central line - PICC Foley:  No Code Status:  full code Last date of multidisciplinary goals of care discussion: Continue to update patient and family daily  No family at bedside 6/18.   Signature:   Performed by: Posey Boyer  Total critical care time: 35 minutes  Critical care time was exclusive of separately billable procedures and treating other patients.  Critical care was necessary to treat or prevent imminent or life-threatening deterioration.  Critical care was time spent personally by me on the following activities: development of treatment plan with patient and/or surrogate as well as nursing, discussions with consultants, evaluation of patient's response to treatment, examination of patient, obtaining history from patient or surrogate, ordering and performing treatments and interventions, ordering and review of laboratory studies, ordering and review of radiographic studies, pulse oximetry and re-evaluation of patient's condition.     Posey Boyer, MSN, NP, AG-ACNP-BC [facility] 02/28/2023, 9:04 AM  See Amion for pager If no response to pager , please call [phone] until 7pm After 7:00 pm call Elink  [phone]

## 2023-02-28 NOTE — Progress Notes (Signed)
OT Cancellation Note  Patient Details Name: [name] MRN: [id number] DOB: 1955-11-30   Cancelled Treatment:    Reason Eval/Treat Not Completed: Other (comment) Patient is in bed with increased sleepiness with nursing and MD working on adjustment of medications. OT to continue to follow and check back as schedule will allow.  Rosalio Loud, MS Acute Rehabilitation Department Office# [phone]  02/28/2023, 3:01 PM

## 2023-02-28 NOTE — Progress Notes (Signed)
Rt transport patient to radiation on LTV vent with no complications. Once back to room patient was placed back on Servo-I.

## 2023-02-28 NOTE — Progress Notes (Signed)
eLink Physician-Brief Progress Note Patient Name: [name] DOB: [birth year] MRN: [id number]   Date of Service  02/28/2023  HPI/Events of Note  Patient became anxious, tachypneic and tachycardic with transient desaturation, per RT breath sounds remained symmetrical and normal but she was a bit wheezy, suction was not compatible with a major mucous plug, she was visibly anxious. Versed 2 mg iv has been given by bedside RN.  eICU Interventions  Fentanyl 100 mcg iv x 1 then begin Fentanyl gtt, titrate Precedex for anxiolysis / comfort. Will check a stat portable CXR to r/o other issues but overall sounds like anxiety / sub-optimal sedation driven.        Migdalia Dk 02/28/2023, 9:39 PM

## 2023-03-01 ENCOUNTER — Other Ambulatory Visit: Payer: Self-pay

## 2023-03-01 ENCOUNTER — Telehealth: Payer: Self-pay | Admitting: Medical Oncology

## 2023-03-01 ENCOUNTER — Ambulatory Visit
Admission: RE | Admit: 2023-03-01 | Discharge: 2023-03-01 | Disposition: A | Discharge status: Home / Self Care | Payer: 59 | Source: Ambulatory Visit | Attending: Radiation Oncology | Admitting: Radiation Oncology

## 2023-03-01 ENCOUNTER — Encounter: Payer: Self-pay | Admitting: Internal Medicine

## 2023-03-01 ENCOUNTER — Ambulatory Visit: Payer: 59

## 2023-03-01 DIAGNOSIS — Z510 Encounter for antineoplastic radiation therapy: Secondary | ICD-10-CM | POA: Diagnosis not present

## 2023-03-01 DIAGNOSIS — R918 Other nonspecific abnormal finding of lung field: Secondary | ICD-10-CM | POA: Diagnosis not present

## 2023-03-01 DIAGNOSIS — J9601 Acute respiratory failure with hypoxia: Secondary | ICD-10-CM | POA: Diagnosis not present

## 2023-03-01 DIAGNOSIS — C3431 Malignant neoplasm of lower lobe, right bronchus or lung: Secondary | ICD-10-CM

## 2023-03-01 DIAGNOSIS — J988 Other specified respiratory disorders: Secondary | ICD-10-CM | POA: Diagnosis not present

## 2023-03-01 DIAGNOSIS — C3432 Malignant neoplasm of lower lobe, left bronchus or lung: Secondary | ICD-10-CM | POA: Diagnosis not present

## 2023-03-01 DIAGNOSIS — Z87891 Personal history of nicotine dependence: Secondary | ICD-10-CM | POA: Diagnosis not present

## 2023-03-01 LAB — GLUCOSE, CAPILLARY
Glucose-Capillary: 110 mg/dL — ABNORMAL HIGH (ref 70–99)
Glucose-Capillary: 116 mg/dL — ABNORMAL HIGH (ref 70–99)
Glucose-Capillary: 132 mg/dL — ABNORMAL HIGH (ref 70–99)
Glucose-Capillary: 139 mg/dL — ABNORMAL HIGH (ref 70–99)
Glucose-Capillary: 156 mg/dL — ABNORMAL HIGH (ref 70–99)
Glucose-Capillary: 158 mg/dL — ABNORMAL HIGH (ref 70–99)

## 2023-03-01 LAB — CBC WITH DIFFERENTIAL/PLATELET
Abs Immature Granulocytes: 0.09 10*3/uL — ABNORMAL HIGH (ref 0.00–0.07)
Basophils Absolute: 0.1 10*3/uL (ref 0.0–0.1)
Basophils Relative: 1 %
Eosinophils Absolute: 0.4 10*3/uL (ref 0.0–0.5)
Eosinophils Relative: 4 %
HCT: 25.3 % — ABNORMAL LOW (ref 36.0–46.0)
Hemoglobin: 7.8 g/dL — ABNORMAL LOW (ref 12.0–15.0)
Immature Granulocytes: 1 %
Lymphocytes Relative: 7 %
Lymphs Abs: 0.7 10*3/uL (ref 0.7–4.0)
MCH: 27.1 pg (ref 26.0–34.0)
MCHC: 30.8 g/dL (ref 30.0–36.0)
MCV: 87.8 fL (ref 80.0–100.0)
Monocytes Absolute: 0.7 10*3/uL (ref 0.1–1.0)
Monocytes Relative: 7 %
Neutro Abs: 8.9 10*3/uL — ABNORMAL HIGH (ref 1.7–7.7)
Neutrophils Relative %: 80 %
Platelets: 347 10*3/uL (ref 150–400)
RBC: 2.88 MIL/uL — ABNORMAL LOW (ref 3.87–5.11)
RDW: 15.4 % (ref 11.5–15.5)
WBC: 11 10*3/uL — ABNORMAL HIGH (ref 4.0–10.5)
nRBC: 0 % (ref 0.0–0.2)

## 2023-03-01 LAB — RAD ONC ARIA SESSION SUMMARY
Course Elapsed Days: 42
Plan Fractions Treated to Date: 27
Plan Prescribed Dose Per Fraction: 2 Gy
Plan Total Fractions Prescribed: 27
Plan Total Prescribed Dose: 54 Gy
Reference Point Dosage Given to Date: 54 Gy
Reference Point Session Dosage Given: 2 Gy
Session Number: 30

## 2023-03-01 LAB — BASIC METABOLIC PANEL
Anion gap: 8 (ref 5–15)
BUN: 39 mg/dL — ABNORMAL HIGH (ref 8–23)
CO2: 32 mmol/L (ref 22–32)
Calcium: 8.7 mg/dL — ABNORMAL LOW (ref 8.9–10.3)
Chloride: 99 mmol/L (ref 98–111)
Creatinine, Ser: 0.92 mg/dL (ref 0.44–1.00)
GFR, Estimated: >60 mL/min (ref >60)
Glucose, Bld: 135 mg/dL — ABNORMAL HIGH (ref 70–99)
Potassium: 4.5 mmol/L (ref 3.5–5.1)
Sodium: 139 mmol/L (ref 135–145)

## 2023-03-01 MED ORDER — QUETIAPINE FUMARATE 100 MG PO TABS
200.0000 mg | ORAL_TABLET | Freq: Every day | ORAL | Status: DC
  Administered 2023-03-01: 200 mg
  Filled 2023-03-01: qty 2

## 2023-03-01 MED ORDER — HYDROXYZINE HCL 25 MG PO TABS
25.0000 mg | ORAL_TABLET | Freq: Three times a day (TID) | ORAL | Status: DC
  Administered 2023-03-01 – 2023-03-02 (×4): 25 mg
  Filled 2023-03-01 (×4): qty 1

## 2023-03-01 MED ORDER — GUAIFENESIN 100 MG/5ML PO LIQD
15.0000 mL | Freq: Four times a day (QID) | ORAL | Status: DC
  Administered 2023-03-01 – 2023-03-02 (×4): 15 mL
  Filled 2023-03-01 (×4): qty 20

## 2023-03-01 NOTE — Progress Notes (Signed)
SLP Cancellation Note  Patient Details Name: [name] MRN: [id number] DOB: 1956-05-03   Cancelled treatment:       Reason Eval/Treat Not Completed: Fatigue/lethargy limiting ability to participate. SLP spoke with RN who reported that patient has been sleepy from sedation for rad tx this morning. SLP will plan to f/u next date.  Angela Nevin, MA, CCC-SLP Speech Therapy

## 2023-03-01 NOTE — Progress Notes (Signed)
CHART NOTE I spoke to the patient's daughter [name] today about her mother's condition and her treatment options.  She has several questions about her prognosis and the response to the radiotherapy. I explained to the patient that her recent CT scan of the abdomen and pelvis which covers part of the chest showed significant evidence for disease progression with retroperitoneal lymphadenopathy as well as subcutaneous soft tissue nodules. I explained to the patient that she is not a good candidate for any systemic treatment at this point even with just immunotherapy because of her poor performance status and significant decline in her condition. I strongly recommend for them to consider palliative care and hospice for more comfort and end-of-life care. The daughter understands the grim situation and the poor prognosis of her mother's condition and she was appreciative for the call.

## 2023-03-01 NOTE — Telephone Encounter (Signed)
Request a call back today from [facility] regarding this pt . Jeannelle is in room 1228 @ [facility].

## 2023-03-01 NOTE — Progress Notes (Signed)
Occupational Therapy Treatment Patient Details Name: [name] MRN: [id number] DOB: 05-28-1956 Today's Date: 03/01/2023   History of present illness 67 year old woman who presented to [facility] 5/1 for nausea/vomiting/diarrhea and abdominal pain x 2 weeks.  Found to have RML and RLL post-obstructive pneumonia with Rt hilar mass, new onset A fib.  She required intubation on 5/06 for hypoxic respiratory failure.  Transferred to [facility] on 5/07 for cancer therapy.  Pt currently with failure to wean from ventilator s/p tracheostomy and should finish XRT (for NSCLC) next week. PMHx significant for COPD, lung CA (adenocarcinoma of LLL, biopsy planned for 5/7), bladder CA (s/p TURBT), GERD, and panic/anxiety.   OT comments  Patient was noted to make progress towards goals today. Patient was motivated to transition to chair with patient indicating her desire when therapists were introducing themselves. Patient was able to transition to recliner in room with STEDY with +2 and nursing present in room for line management. Patient noted to have edema in BUE with education on movement and elevation. Patient indicated understanding. Patient's discharge plan remains appropriate at this time. OT will continue to follow acutely.     Recommendations for follow up therapy are one component of a multi-disciplinary discharge planning process, led by the attending physician.  Recommendations may be updated based on patient status, additional functional criteria and insurance authorization.    Assistance Recommended at Discharge Frequent or constant Supervision/Assistance  Patient can return home with the following  Two people to help with bathing/dressing/bathroom;Two people to help with walking and/or transfers;Assistance with cooking/housework;Assist for transportation;Help with stairs or ramp for entrance;Direct supervision/assist for financial management   Equipment Recommendations  None recommended by OT        Precautions / Restrictions Precautions Precautions: Fall Precaution Comments: monitor vitals, NG feeding tube, trach to vent Restrictions Weight Bearing Restrictions: No       Mobility Bed Mobility Overal bed mobility: Needs Assistance Bed Mobility: Supine to Sit     Supine to sit: HOB elevated, Mod assist, +2 for physical assistance     General bed mobility comments: with increased time and cues for proper movements.    Transfers Overall transfer level: Needs assistance   Transfers: Bed to chair/wheelchair/BSC, Sit to/from Stand Sit to Stand: Mod assist, From elevated surface, +2 physical assistance           General transfer comment: assist to power up using Stedy from elevated bed. Pt able to stand for ~45 seconds for pivot to recliner Transfer via Lift Equipment: Stedy   Balance Overall balance assessment: Needs assistance Sitting-balance support: Feet unsupported Sitting balance-Leahy Scale: Good     Standing balance support: During functional activity, Bilateral upper extremity supported, Reliant on assistive device for balance Standing balance-Leahy Scale: Poor         ADL either performed or assessed with clinical judgement    Extremity/Trunk Assessment Upper Extremity Assessment Upper Extremity Assessment: LUE deficits/detail LUE Deficits / Details: noted to have edema on posterior hand with patient provided with stress ball to use in hands and pillows to elevate BUE.             Cognition Arousal/Alertness: Awake/alert Behavior During Therapy: Flat affect Overall Cognitive Status: Difficult to assess         General Comments: patient was able to nod and shake head to answer questions, motivated to get in chair today        Exercises Other Exercises Other Exercises: patient able to use stress ball in  L hand to paritcipate in squeezes to reduce edema build up. with education provided on use. patient nodded in understanding.             Pertinent Vitals/ Pain       Pain Assessment Pain Assessment: Faces Faces Pain Scale: Hurts a little bit Pain Location: throat Pain Descriptors / Indicators: Discomfort Pain Intervention(s): Limited activity within patient's tolerance, Monitored during session, Other (comment) (nurse present provided medications)         Frequency  Min 1X/week        Progress Toward Goals  OT Goals(current goals can now be found in the care plan section)  Progress towards OT goals: Progressing toward goals     Plan Discharge plan remains appropriate;Frequency remains appropriate    Co-evaluation    PT/OT/SLP Co-Evaluation/Treatment: Yes Reason for Co-Treatment: Complexity of the patient's impairments (multi-system involvement);For patient/therapist safety;To address functional/ADL transfers PT goals addressed during session: Mobility/safety with mobility;Proper use of DME OT goals addressed during session: ADL's and self-care      AM-PAC OT "6 Clicks" Daily Activity     Outcome Measure   Help from another person eating meals?: Total Help from another person taking care of personal grooming?: A Lot Help from another person toileting, which includes using toliet, bedpan, or urinal?: A Lot Help from another person bathing (including washing, rinsing, drying)?: A Lot Help from another person to put on and taking off regular upper body clothing?: A Lot Help from another person to put on and taking off regular lower body clothing?: A Lot 6 Click Score: 11    End of Session Equipment Utilized During Treatment: Oxygen;Other (comment) (STEDY)  OT Visit Diagnosis: Other abnormalities of gait and mobility (R26.89);Muscle weakness (generalized) (M62.81)   Activity Tolerance Patient tolerated treatment well   Patient Left with call bell/phone within reach;in chair;with chair alarm set   Nurse Communication Other (comment) (nurse in room during transfer)        Time: [id number] OT  Time Calculation (min): 28 min  Charges: OT General Charges $OT Visit: 1 Visit OT Treatments $Therapeutic Activity: 8-22 mins  Rosalio Loud, MS Acute Rehabilitation Department Office# [phone]   Selinda Flavin 03/01/2023, 3:24 PM

## 2023-03-01 NOTE — Progress Notes (Signed)
Rt tried patient on CPAP/ PS 12/5 and patient failed. RR went to 56 with increased WOB. Patient placed back on full support.

## 2023-03-01 NOTE — Progress Notes (Signed)
Rt transported patient to radiation on LTV vent with no complications. Once back to room patient was hooked back to servo-I.

## 2023-03-01 NOTE — Progress Notes (Signed)
Physical Therapy Treatment Patient Details Name: [name] MRN: [id number] DOB: Oct 23, 1955 Today's Date: 03/01/2023   History of Present Illness 67 year old woman who presented to [facility] 5/1 for nausea/vomiting/diarrhea and abdominal pain x 2 weeks.  Found to have RML and RLL post-obstructive pneumonia with Rt hilar mass, new onset A fib.  She required intubation on 5/06 for hypoxic respiratory failure.  Transferred to [facility] on 5/07 for cancer therapy.  Pt currently with failure to wean from ventilator s/p tracheostomy and should finish XRT (for NSCLC) next week. PMHx significant for COPD, lung CA (adenocarcinoma of LLL, biopsy planned for 5/7), bladder CA (s/p TURBT), GERD, and panic/anxiety.    PT Comments    The patient  is aroused and able to participate in mobility.  Patient indicated that she wanted to get up. The patient able to stand  at [facility] lift and transferred to recliner and stood again. HR 120-140 , Spo2 on vent remained 87-95%.  Continue  mobility.  Recommendations for follow up therapy are one component of a multi-disciplinary discharge planning process, led by the attending physician.  Recommendations may be updated based on patient status, additional functional criteria and insurance authorization.  Follow Up Recommendations  Can patient physically be transported by private vehicle: No    Assistance Recommended at Discharge Frequent or constant Supervision/Assistance  Patient can return home with the following Assist for transportation;Help with stairs or ramp for entrance;Assistance with cooking/housework;Two people to help with walking and/or transfers;Two people to help with bathing/dressing/bathroom   Equipment Recommendations  None recommended by PT    Recommendations for Other Services       Precautions / Restrictions Precautions Precautions: Fall Precaution Comments: monitor vitals, NG feeding tube, trach to vent Restrictions Weight Bearing Restrictions: No      Mobility  Bed Mobility                    Transfers Overall transfer level: Needs assistance   Transfers: Bed to chair/wheelchair/BSC, Sit to/from Stand Sit to Stand: Mod assist, From elevated surface, +2 physical assistance           General transfer comment: assist to power up using Stedy from elevated bed. Patient able to stand  pulling on STEDY bar to have flaps placed to sit for transfer to recliner and stood again  prior to sitting down to recliner.  Transfer via Lift Equipment: Stedy  Ambulation/Gait                   Stairs             Wheelchair Mobility    Modified Rankin (Stroke Patients Only)       Balance Overall balance assessment: Needs assistance Sitting-balance support: Feet supported Sitting balance-Leahy Scale: Fair Sitting balance - Comments: supports self on stedy bar or bed   Standing balance support: During functional activity, Bilateral upper extremity supported, Reliant on assistive device for balance Standing balance-Leahy Scale: Poor                              Cognition Arousal/Alertness: Awake/alert Behavior During Therapy: Flat affect Overall Cognitive Status: Difficult to assess                                 General Comments: patient was able to nod and shake head to answer questions, motivated to get  in chair today        Exercises      General Comments        Pertinent Vitals/Pain Pain Assessment Faces Pain Scale: Hurts little more Pain Location: throat Pain Descriptors / Indicators: Discomfort    Home Living                          Prior Function            PT Goals (current goals can now be found in the care plan section) Acute Rehab PT Goals Potential to Achieve Goals: Fair Progress towards PT goals: Progressing toward goals    Frequency    Min 1X/week      PT Plan Current plan remains appropriate    Co-evaluation PT/OT/SLP  Co-Evaluation/Treatment: Yes Reason for Co-Treatment: Complexity of the patient's impairments (multi-system involvement);For patient/therapist safety;To address functional/ADL transfers PT goals addressed during session: Mobility/safety with mobility OT goals addressed during session: ADL's and self-care      AM-PAC PT "6 Clicks" Mobility   Outcome Measure  Help needed turning from your back to your side while in a flat bed without using bedrails?: A Lot Help needed moving from lying on your back to sitting on the side of a flat bed without using bedrails?: A Lot Help needed moving to and from a bed to a chair (including a wheelchair)?: Total Help needed standing up from a chair using your arms (e.g., wheelchair or bedside chair)?: Total Help needed to walk in hospital room?: Total Help needed climbing 3-5 steps with a railing? : Total 6 Click Score: 8    End of Session Equipment Utilized During Treatment: Gait belt Activity Tolerance: Patient limited by lethargy Patient left: in chair;with chair alarm set;with nursing/sitter in room Nurse Communication: Mobility status;Need for lift equipment PT Visit Diagnosis: Muscle weakness (generalized) (M62.81)     Time: 1610-9604 PT Time Calculation (min) (ACUTE ONLY): 29 min  Charges:  $Therapeutic Activity: 8-22 mins                     Blanchard Kelch PT Acute Rehabilitation Services Office [phone] Weekend [phone]    Rada Hay 03/01/2023, 4:02 PM

## 2023-03-01 NOTE — Progress Notes (Signed)
NAME:  [name], MRN:  [id number], DOB:  1955/11/05, LOS: 48 ADMISSION DATE:  01/11/2023, CONSULTATION DATE:  01/16/2023 REFERRING MD: FMTS, CHIEF COMPLAINT: Acute Respiratory Failure in the setting of Lung Cancer, COPD and infiltrate on CXR   History of Present Illness:  67 year old woman, former smoker who presented to [facility] ED 5/1 with nausea, vomiting, diarrhea and abdominal pain for 2 weeks.  Found to have RML and RLL post-obstructive pneumonia with Rt hilar mass, new onset A fib.  She required intubation on 5/06 for hypoxic respiratory failure.  Transferred to [facility] on 5/07 for cancer therapy.  Pertinent Medical History:  COPD, Adenocarcinoma LLL November 2021, Bladder cancer, GERD, Anxiety with panic attacks, Depression, Allergies  Significant Hospital Events: Including procedures, antibiotic start and stop dates in addition to other pertinent events   5/6 Transferred to ICU for acute Respiratory Failure , intubated  5/7 Bronch by Icard. significant extrinsic compression of the airways in the right lower lobe and middle lobe. This is likely malignant obstruction. Bx sent. Transferred to [facility] for rad/onc eval. PICC placed 5/8 Stable still high PEEP/FiO2 needs. Rad/onc consult requested  5/9 start radiation therapy 5/10 Tolerating SBT trial this AM on low-dose fentanyl and Precedex.  Propofol DC'd due to high triglycerides 5/11 Tolerates SBT's, pressure support 10/5 5/12 Off of pressors. Remains on vent, sedated with Precedex/Fentanyl. PMT consulted. 5/13 Weaning on PSV 14/8. > 10L positive over the course of admission. Diuresing. 5/14 Weaning on PSV. Ongoing diuresis. 5/15 Tolerating SBT, PSV 5/5, following commands. Continue diuresis, Foley placed for persistent urinary retention. Extubated, decompensated quickly after requiring reintubation. DHT placed. 5/16 Agitated overnight, c/o throat pain with ETT, RASS 1-2 despite sedation. Failed extubation. Tolerating SBT PSV 10/5, FiO2 35%.  ~1630 desaturated with inability to pass [place] suction/difficulty bagging; urgent tube exchange completed with bougie and blood clot removed with improvement in oxygenation/ventilation. 5/17 Tracheostomy placed 5/19 Back in atrial fibrillation in am, HR 150s 5/20 Weaned on pressure support 10/5 5/21 Desaturated during weaning. Still on dex and fent gtt 5/23 added fent patch and seroquel had to have NE gtt and midodrine started for drug related hypotension 5/24 Trialing fent gtt off. Foley removed 5/25 3 hrs. trach collar 5/26 Pressure support minimal trach collar 5/28 SIMV/PS, increased pain. Changed oxy fent patch to dilaudid incr seroquel 5/29 radiation. Off dilaudid gtt. Lower dose precedex requirement  5/30 try to wean precedex. Repeat anxiety during/after rad. Incr anxiolytics 6/01 change to pressure control as rest mode 6/4 trial vent valve and able to phonate, trach collar trial for ~1-2 hours surprisingly and wore PMV for 5 minutes and able to phonate well  6/6 intermittent plugging, underwent bronch but required surprisingly little therapeutic aspiration  6/6-6/7 required 3U pRBC transfusion total without clear source of blood loss. Therapeutic AC has been held in advance of g-tube placement on 6/10, remains on ppx lovenox. 6/10 CT abdomen concerning for new nodular lesions in the soft tissue and retroperitoneal space. 6/12 Tolerated TCT with PMV in place x 1 hour, time-limited by anxiety. 6/13 XRT session. Transiently hypotensive overnight, Levo ordered but not started. 6/14 XRT session.  Increased anxiety >> added diprivan and continue precedex 6/17 Enteral pain and anxiety regiment optimized with goal to discontinue propofol.   6/18 tmax 100, hypotensive overnight, responsive to IVF  Interim History / Subjective:  desaturated overnight/ HR 160s  2/2 suspected partial mucous plugging (was easy to bag) and worsening anxiety overnight, started on fentanyl gtt.  Pt denies sob  or  pain, c/o of anxiety Last radiation tx today  Objective:  Blood pressure (!) 141/69, pulse (!) 125, temperature 98.9 F (37.2 C), temperature source Axillary, resp. rate (!) 30, height 5\' 8"  (1.727 m), weight 96.9 kg, SpO2 97 %.    Vent Mode: PCV FiO2 (%):  [60 %] 60 % Set Rate:  [24 bmp] 24 bmp PEEP:  [5 cmH20] 5 cmH20 Plateau Pressure:  [11 cmH20-27 cmH20] 20 cmH20   Intake/Output Summary (Last 24 hours) at 03/01/2023 0740 Last data filed at 03/01/2023 0600 Gross per 24 hour  Intake 1632.41 ml  Output 538 ml  Net 1094.41 ml   Filed Weights   02/27/23 0438 02/28/23 0500 03/01/23 0403  Weight: 97.7 kg 94.8 kg 96.9 kg   Physical Examination: General:  AoC ill appearing older female sitting upright in bed, anxious  HEENT: MM pink/moist, cortrak, midline trach- at times grunts/ phonates around trach Neuro: Awake, nods appropriately, f/c, MAE-generalized weakness CV: rr, ST 130s, no murmur PULM:  tachypneic in 30's on full MV PCV, good TV's, coarse/ scattered rhonchi> minimal thick tan secretions with few small darker pieces  GI:  soft, +bs, NT, purwick- some stool noted on purwick despite FMS Extremities: warm/dry, generalized +1-2  Skin: no rashes   Tmax 99.9 Labs/ CXR overnight reviewed> BMET/ CBC stable   Resolved Hospital Problem List:  Hyponatremia , Hypotension drug induced from sedation. Vocal cord edema, Post obstructive PNA: Zosyn completed 5/13, Hypotension from hypovolemia and sedation  Assessment & Plan:   Acute hypoxic respiratory failure in setting of NSCLC, post obstructive pneumonia, and mucus plugging with atelectasis. Failure to wean from ventilator s/p tracheostomy. COPD with emphysema. P: - cont full MV support, PCV - scheduled to complete palliative XRT 6/19, today - still no progression on MV.  Unable to wean or titrate FiO2.  Anxiety remains large barrier, in addition to underlying lung malignancy, and generalized weakness 2/2 critical/ prolonged  illness/ failure to thrive.   - possible mucous plugging overnight> some part may be partial occluding of airway due to tumor burden.  Add guaifenesin  - cont BD, aggressive pulm hygiene - VAP/ PPI - cont trach care  - trach asp > few candida albicans - cont PAD protocol> see below.     Rt hilar mass from NSCLC with hx of LLL adenocarcinoma in November 2021. - should complete XRT 6/19 - concern she has new metastatic lesions seen on CT abd/pelvis from 6/09 - seen by Dr. Arbutus Ped 6/4 >> would need to recover from respiratory failure before being a candidate for systemic chemotherapy P: - Continue palliative XRT treatment - see IPAL note 6/17> daughter would like to speak again with oncology after completion of XRT to determine if candidate for chemotherapy.  Reaching back out to Dr. Arbutus Ped today for further recommendations.  Pending discussion, consider reconsulting PMT.   Acute metabolic encephalopathy from sepsis and hypoxia. Depression with anxiety. Cancer pain. P: - cont precedex, prn versed, klonopin 2mg  q8hrs - stop fentanyl gtt.  More anxiety per patient, denies pain - add back hydroxyzine and increase seroquel (Qtc ok) - cont buspar (increased 6/17), celexa, and Neurontin  - cont dilaudid 3mg  q4hrs and prn for breakthrough pain w/ bowel regimen   Paroxysmal atrial fibrillation. Hx of HTN, HLD. P: - remains in ST, monitor on tele - holding eliquis - goal MAP > 65.  SBP's remains liable at time, off pressors, mostly due to pain, anxiety, and medications.  Consider trial of diureses again  today if SBP remains elevated.  - cont statin  Dysphagia. - IR to follow up week of 6/17 and arrange for PEG after resolution of ileus P: - TF per cortrak pending IR re-evaluation   Anemia of critical illness and chronic disease. P: - trend CBC, transfuse for Hgb < 7  Urine retention. - foley d/c'ed 6/10 P: - monitor.  D/c purwick given leaking FMS.   Physical  deconditioning P: - PT/OT as able - TOC following for placement needs  Goals of care - Revisited on 6/4, continuing current measures including goal to complete planned course of XRT.  Will reengage goals of care discussion this week as XRT therapy is due to end and images reveal likely progressive disease - pending Dr. Asa Lente recommendations today, ongoing GOC  Best Practice: (right click and "Reselect all SmartList Selections" daily)   Diet/type: tubefeeds DVT prophylaxis: Lovenox, hold Eliquis GI prophylaxis: H2B Lines: Central line - PICC Foley:  No Code Status:  full code Last date of multidisciplinary goals of care discussion: Continue to update patient and family daily  No family at bedside 6/19am.  Daughter usually visits after 6pm.   Signature:   Performed by: Posey Boyer  Total critical care time: 32 minutes  Critical care time was exclusive of separately billable procedures and treating other patients.  Critical care was necessary to treat or prevent imminent or life-threatening deterioration.  Critical care was time spent personally by me on the following activities: development of treatment plan with patient and/or surrogate as well as nursing, discussions with consultants, evaluation of patient's response to treatment, examination of patient, obtaining history from patient or surrogate, ordering and performing treatments and interventions, ordering and review of laboratory studies, ordering and review of radiographic studies, pulse oximetry and re-evaluation of patient's condition.     Posey Boyer, MSN, NP, AG-ACNP-BC [facility] 03/01/2023, 7:40 AM  See Amion for pager If no response to pager , please call [phone] until 7pm After 7:00 pm call Elink  [phone]

## 2023-03-02 DIAGNOSIS — J9601 Acute respiratory failure with hypoxia: Secondary | ICD-10-CM

## 2023-03-02 DIAGNOSIS — R5381 Other malaise: Secondary | ICD-10-CM | POA: Diagnosis not present

## 2023-03-02 DIAGNOSIS — J9602 Acute respiratory failure with hypercapnia: Secondary | ICD-10-CM

## 2023-03-02 DIAGNOSIS — Z515 Encounter for palliative care: Secondary | ICD-10-CM | POA: Diagnosis not present

## 2023-03-02 DIAGNOSIS — R918 Other nonspecific abnormal finding of lung field: Secondary | ICD-10-CM | POA: Diagnosis not present

## 2023-03-02 DIAGNOSIS — Z7189 Other specified counseling: Secondary | ICD-10-CM | POA: Diagnosis not present

## 2023-03-02 DIAGNOSIS — Z930 Tracheostomy status: Secondary | ICD-10-CM

## 2023-03-02 DIAGNOSIS — Z9911 Dependence on respirator [ventilator] status: Secondary | ICD-10-CM

## 2023-03-02 DIAGNOSIS — F419 Anxiety disorder, unspecified: Secondary | ICD-10-CM | POA: Diagnosis not present

## 2023-03-02 LAB — GLUCOSE, CAPILLARY
Glucose-Capillary: 129 mg/dL — ABNORMAL HIGH (ref 70–99)
Glucose-Capillary: 108 mg/dL — ABNORMAL HIGH (ref 70–99)
Glucose-Capillary: 138 mg/dL — ABNORMAL HIGH (ref 70–99)
Glucose-Capillary: 90 mg/dL (ref 70–99)
Glucose-Capillary: 91 mg/dL (ref 70–99)

## 2023-03-02 LAB — MAGNESIUM: Magnesium: 2.2 mg/dL (ref 1.7–2.4)

## 2023-03-02 LAB — RENAL FUNCTION PANEL
Albumin: 2.3 g/dL — ABNORMAL LOW (ref 3.5–5.0)
Anion gap: 6 (ref 5–15)
BUN: 34 mg/dL — ABNORMAL HIGH (ref 8–23)
CO2: 32 mmol/L (ref 22–32)
Calcium: 8.3 mg/dL — ABNORMAL LOW (ref 8.9–10.3)
Chloride: 100 mmol/L (ref 98–111)
Creatinine, Ser: 0.83 mg/dL (ref 0.44–1.00)
GFR, Estimated: >60 mL/min (ref >60)
Glucose, Bld: 157 mg/dL — ABNORMAL HIGH (ref 70–99)
Phosphorus: 4.1 mg/dL (ref 2.5–4.6)
Potassium: 4.2 mmol/L (ref 3.5–5.1)
Sodium: 138 mmol/L (ref 135–145)

## 2023-03-02 LAB — CBC WITH DIFFERENTIAL/PLATELET
Abs Immature Granulocytes: 0.07 10*3/uL (ref 0.00–0.07)
Basophils Absolute: 0 10*3/uL (ref 0.0–0.1)
Basophils Relative: 0 %
Eosinophils Absolute: 0.4 10*3/uL (ref 0.0–0.5)
Eosinophils Relative: 4 %
HCT: 23.8 % — ABNORMAL LOW (ref 36.0–46.0)
Hemoglobin: 7.1 g/dL — ABNORMAL LOW (ref 12.0–15.0)
Immature Granulocytes: 1 %
Lymphocytes Relative: 6 %
Lymphs Abs: 0.6 10*3/uL — ABNORMAL LOW (ref 0.7–4.0)
MCH: 26.6 pg (ref 26.0–34.0)
MCHC: 29.8 g/dL — ABNORMAL LOW (ref 30.0–36.0)
MCV: 89.1 fL (ref 80.0–100.0)
Monocytes Absolute: 0.8 10*3/uL (ref 0.1–1.0)
Monocytes Relative: 7 %
Neutro Abs: 8.6 10*3/uL — ABNORMAL HIGH (ref 1.7–7.7)
Neutrophils Relative %: 82 %
Platelets: 323 10*3/uL (ref 150–400)
RBC: 2.67 MIL/uL — ABNORMAL LOW (ref 3.87–5.11)
RDW: 15.5 % (ref 11.5–15.5)
WBC: 10.5 10*3/uL (ref 4.0–10.5)
nRBC: 0 % (ref 0.0–0.2)

## 2023-03-02 MED ORDER — SODIUM BICARBONATE 650 MG PO TABS
650.0000 mg | ORAL_TABLET | Freq: Once | ORAL | Status: AC
  Administered 2023-03-02: 650 mg
  Filled 2023-03-02: qty 1

## 2023-03-02 MED ORDER — PANCRELIPASE (LIP-PROT-AMYL) 10440-39150 UNITS PO TABS
20880.0000 [IU] | ORAL_TABLET | Freq: Once | ORAL | Status: AC
  Administered 2023-03-02: 20880 [IU]
  Filled 2023-03-02: qty 2

## 2023-03-02 NOTE — Progress Notes (Addendum)
NAME:  [name], MRN:  [id number], DOB:  1955-12-21, LOS: 49 ADMISSION DATE:  01/11/2023, CONSULTATION DATE:  01/16/2023 REFERRING MD: FMTS, CHIEF COMPLAINT: Acute Respiratory Failure in the setting of Lung Cancer, COPD and infiltrate on CXR   History of Present Illness:  67 year old woman, former smoker who presented to [facility] ED 5/1 with nausea, vomiting, diarrhea and abdominal pain for 2 weeks.  Found to have RML and RLL post-obstructive pneumonia with Rt hilar mass, new onset A fib.  She required intubation on 5/06 for hypoxic respiratory failure.  Transferred to [facility] on 5/07 for cancer therapy.  Pertinent Medical History:  COPD, Adenocarcinoma LLL November 2021, Bladder cancer, GERD, Anxiety with panic attacks, Depression, Allergies  Significant Hospital Events: Including procedures, antibiotic start and stop dates in addition to other pertinent events   5/6 Transferred to ICU for acute Respiratory Failure , intubated  5/7 Bronch by Icard. significant extrinsic compression of the airways in the right lower lobe and middle lobe. This is likely malignant obstruction. Bx sent. Transferred to [facility] for rad/onc eval. PICC placed 5/8 Stable still high PEEP/FiO2 needs. Rad/onc consult requested  5/9 start radiation therapy 5/10 Tolerating SBT trial this AM on low-dose fentanyl and Precedex.  Propofol DC'd due to high triglycerides 5/11 Tolerates SBT's, pressure support 10/5 5/12 Off of pressors. Remains on vent, sedated with Precedex/Fentanyl. PMT consulted. 5/13 Weaning on PSV 14/8. > 10L positive over the course of admission. Diuresing. 5/14 Weaning on PSV. Ongoing diuresis. 5/15 Tolerating SBT, PSV 5/5, following commands. Continue diuresis, Foley placed for persistent urinary retention. Extubated, decompensated quickly after requiring reintubation. DHT placed. 5/16 Agitated overnight, c/o throat pain with ETT, RASS 1-2 despite sedation. Failed extubation. Tolerating SBT PSV 10/5, FiO2 35%.  ~1630 desaturated with inability to pass [place] suction/difficulty bagging; urgent tube exchange completed with bougie and blood clot removed with improvement in oxygenation/ventilation. 5/17 Tracheostomy placed 5/19 Back in atrial fibrillation in am, HR 150s 5/20 Weaned on pressure support 10/5 5/21 Desaturated during weaning. Still on dex and fent gtt 5/23 added fent patch and seroquel had to have NE gtt and midodrine started for drug related hypotension 5/24 Trialing fent gtt off. Foley removed 5/25 3 hrs. trach collar 5/26 Pressure support minimal trach collar 5/28 SIMV/PS, increased pain. Changed oxy fent patch to dilaudid incr seroquel 5/29 radiation. Off dilaudid gtt. Lower dose precedex requirement  5/30 try to wean precedex. Repeat anxiety during/after rad. Incr anxiolytics 6/01 change to pressure control as rest mode 6/4 trial vent valve and able to phonate, trach collar trial for ~1-2 hours surprisingly and wore PMV for 5 minutes and able to phonate well  6/6 intermittent plugging, underwent bronch but required surprisingly little therapeutic aspiration  6/6-6/7 required 3U pRBC transfusion total without clear source of blood loss. Therapeutic AC has been held in advance of g-tube placement on 6/10, remains on ppx lovenox. 6/10 CT abdomen concerning for new nodular lesions in the soft tissue and retroperitoneal space. 6/12 Tolerated TCT with PMV in place x 1 hour, time-limited by anxiety. 6/13 XRT session. Transiently hypotensive overnight, Levo ordered but not started. 6/14 XRT session.  Increased anxiety >> added diprivan and continue precedex 6/17 Enteral pain and anxiety regiment optimized with goal to discontinue propofol.   6/18 tmax 100, hypotensive overnight, responsive to IVF 6/19 completed XRT. D/w onc, no systemic tx options. Rec comfort/hospice 6/20 Palli to see   Interim History / Subjective:  NAEO  Off precedex gtt   Objective:  Blood  pressure 134/76, pulse  (!) 128, temperature 98.7 F (37.1 C), temperature source Axillary, resp. rate 17, height 5\' 8"  (1.727 m), weight 94.9 kg, SpO2 98 %.    Vent Mode: PCV FiO2 (%):  [50 %-60 %] 60 % Set Rate:  [24 bmp] 24 bmp PEEP:  [5 cmH20] 5 cmH20 Pressure Support:  [12 cmH20] 12 cmH20 Plateau Pressure:  [19 cmH20-31 cmH20] 19 cmH20   Intake/Output Summary (Last 24 hours) at 03/02/2023 0854 Last data filed at 03/02/2023 0600 Gross per 24 hour  Intake 1530.55 ml  Output 3575 ml  Net -2044.45 ml   Filed Weights   02/28/23 0500 03/01/23 0403 03/02/23 0500  Weight: 94.8 kg 96.9 kg 94.9 kg   Physical Examination: General:  Acutely and chronically ill older adult F trach/vent NAD  HEENT: Trach secure. Facial hair.  Neuro:Drowsy, awakens to voice and follows commands but drifts off  CV: reg rhythm, tachy rate PULM:  Mechanically ventilated. Symmetrical chest expansion  GI: soft round nontender  Extremities: generalized edema no acute joint deformity  Skin: c/d/w   Resolved Hospital Problem List:  Hyponatremia , Hypotension drug induced from sedation. Vocal cord edema, Post obstructive PNA: Zosyn completed 5/13, Hypotension from hypovolemia and sedation  Assessment & Plan:   NSCLC GOC  -Rt hilar mass from NSCLC with hx of LLL adenocarcinoma in November 2021. -concern on CT a/p 6/9 she may have met dz  P -completed palliative XRT 6/19 -not a candidate for systemic tx, onc has recommended hospice / comfort care, which I agree with as I think degree of physical debility/anxiety will preclude ability to wean from MV  -will talk w family today, palliative care is also consulted to aid in this discussions   Acute resp failure with hypoxia and hypercarbia NSCLC, associated post obstructive PNA  S/p Trach Trach/vent dependence  COPD  P: - Cont to try MV wean but fails SBT very quickly with degree of physical debility + anxiety  -cont pulm hygiene, VAP  -routine trach care   Acute metabolic  encephalopathy Anxiety (severe)  Depression  Cancer related pain, opiate dependence  -anxiety has been greatly limiting ability to liberate from cont sedation. Her anxiety + debility are limiting MV progression, and unfortunately attempts to wean MV are generally met w severe anxiety  P: - cont precedex, prn versed, klonopin 2mg  q8hrs - cont SCH buspar, atarax (room to incr this) seroquel (room to incr),  clonazepam  -wean precedex gtt, PRN versed  -cont celexa, remeron  -cont SCH gaba, dilaudid  + PRN dilaudid    Hypotension -?medication related, Dont think septic  P -wean NE SBP goal > 90 MAP 65   pAF -- now sinus tach Hx HTN, HLD  P: - holding eliquis - PRN diuresis   Dysphagia  Ileus, resolved  - IR to follow up week of 6/17 and arrange for PEG given resolution of ileus    Anemia -chronic dz critical illness  P: - PRN CBC, do not think daily labs are needed    Urinary retention  - foley replaced 6/19  P: - cont foley, urecholine   Severe physical deconditioning  P: - TOC following, goal to date has been LTAC. As above, concerns re degree of debility and anxiety. GOC discussions are pending given latest onc update.    Best Practice: (right click and "Reselect all SmartList Selections" daily)   Diet/type: tubefeeds DVT prophylaxis: Lovenox  GI prophylaxis: H2B Lines: Central line - PICC Foley:  Yes  Code Status:  full code Last date of multidisciplinary goals of care discussion: -- Talked w daughter [name] 6/20 on the phone, she plans to come to the hospital later today and we plan to discuss further where we go from here. She brought up goal of LTAC and I gently said that it is possible A[facility] may not be a candidate for this, and in light of Onc update rec no further tx, think we need to revisit overall GOC and consider comfort care.  Signature:   CRITICAL CARE Performed by: Lanier Clam   Total critical care time: 46 minutes  Critical care time was  exclusive of separately billable procedures and treating other patients. Critical care was necessary to treat or prevent imminent or life-threatening deterioration.  Critical care was time spent personally by me on the following activities: development of treatment plan with patient and/or surrogate as well as nursing, discussions with consultants, evaluation of patient's response to treatment, examination of patient, obtaining history from patient or surrogate, ordering and performing treatments and interventions, ordering and review of laboratory studies, ordering and review of radiographic studies, pulse oximetry and re-evaluation of patient's condition.  Tessie Fass MSN, AGACNP-BC [facility] Pulmonary/Critical Care Medicine Amion for pager  03/02/2023, 8:54 AM

## 2023-03-02 NOTE — TOC Progression Note (Signed)
Transition of Care [facility]) - Progression Note    Patient Details  Name: [name] MRN: [id number] Date of Birth: [birth year]  [facility] CM/SW Contact  Lavenia Atlas, RN Phone Number: 03/02/2023, 2:10 PM  Clinical Narrative:   Per chart review palliative team continues to follow patient for GOC.   TOC will continue to follow for discharge needs.     Expected Discharge Plan: Long Term Acute Care (LTAC) Barriers to Discharge: Continued Medical Work up  Expected Discharge Plan and Services In-house Referral: Chaplain, Hospice / Palliative Care Discharge Planning Services: CM Consult Post Acute Care Choice: Long Term Acute Care (LTAC) Living arrangements for the past 2 months: Apartment Expected Discharge Date: 01/14/23               DME Arranged: N/A DME Agency: NA       HH Arranged: NA HH Agency: NA         Social Determinants of Health (SDOH) Interventions SDOH Screenings   Food Insecurity: No Food Insecurity (01/13/2023)  Housing: Low Risk  (01/13/2023)  Transportation Needs: No Transportation Needs (01/13/2023)  Utilities: Not At Risk (01/13/2023)  Alcohol Screen: Low Risk  (12/13/2022)  Depression (PHQ2-9): Low Risk  (12/13/2022)  Financial Resource Strain: Low Risk  (12/13/2022)  Physical Activity: Inactive (12/13/2022)  Social Connections: Moderately Integrated (12/13/2022)  Stress: No Stress Concern Present (12/13/2022)  Tobacco Use: Medium Risk (02/20/2023)    Readmission Risk Interventions    02/07/2023    2:14 PM 02/01/2023   12:07 PM  Readmission Risk Prevention Plan  Transportation Screening Complete Complete  PCP or Specialist Appt within 3-5 Days  Complete  HRI or Home Care Consult  Complete  Social Work Consult for Recovery Care Planning/Counseling  Complete  Palliative Care Screening  Complete  Medication Review Oceanographer) Complete Complete  PCP or Specialist appointment within 3-5 days of discharge Complete   HRI or Home Care Consult  Complete   SW Recovery Care/Counseling Consult Complete   Palliative Care Screening Complete   Skilled Nursing Facility Complete

## 2023-03-02 NOTE — IPAL (Signed)
  Interdisciplinary Goals of Care Family Meeting   Date carried out: 03/02/2023  Location of the meeting: Bedside  Member's involved: Physician and Family Member or next of kin  Durable Power of Attorney or acting medical decision maker: Daughter, [name] present at bedside  Discussion: We discussed goals of care for [employer] .  Despite aggressive efforts at treating her cancer patient is worse overall.  On more oxygen.  Signs of metastasis on most recent imaging.  No significant clinical improvement despite aggressive radiation.  High doses of sedatives and pain medicines to treat significant cancer related symptoms.  Given her inability to wean from the ventilator and worsening over time, she does not have rehab potential and would not be a candidate for additional rehabilitative services.  Oncology has discussed with daughter that she is not a candidate for additional cancer directed therapies.  In light of this, I recommended comfort measures.  This was explained in detail.  The daughter agrees.  I recommend additional family and loved ones visit soon in the coming days and and effort not to prolong ongoing pain and suffering.  Sounds like other daughter plans to visit this weekend.  Daughter will let other family members know to visit in the coming days.  I recommended that patient protest be changed to DNR and allow her to pass naturally in the interim if that were to occur prior to the planned withdrawal of care.  Daughter agrees.  Code status:   Code Status: DNR   Disposition: Continue current measures without escalation of care, anticipate In-patient comfort care  Time spent for the meeting: n/a    Karren Burly, MD  03/02/2023, 5:07 PM

## 2023-03-02 NOTE — Progress Notes (Signed)
Daily Progress Note   Patient Name: [name]       Date: 03/02/2023 DOB: 07/08/1956  Age: 67 y.o. MRN#: [id number] Attending Physician: Karren Burly, MD Primary Care Physician: Fayette Pho, MD Admit Date: 01/11/2023 Length of Stay: 49 days  Reason for Consultation/Follow-up: Establishing goals of care  Subjective:   CC: Patient will awaken to voice and nod head, though unable to speak. Following up regarding complex medical decision making.   Subjective: Palliative medicine team has followed along peripherally throughout patient's prolonged hospitalization.  During patient's prolonged hospitalization she is continue to receive radiation therapy.  Patient failed extubation and so had trach placed for ventilator support.  Patient failing ventilator wean despite aggressive medical interventions and continues to functionally decline.  PCCM provider asked palliative medicine team to reengage after repeat scans regarding lung cancer.  CT scans noting significant evidence for disease progression despite radiation therapy.  Patient's oncologist, Dr. Arbutus Ped, spoke to patient and family that based on her poor PPS, patient is not a candidate for further cancer directed therapies.  Oncologist strongly recommended consideration of hospice.  Discussed care with PCCM providers prior to presenting at bedside.   PCCM provider Tessie Fass, NP had already spoke with daughter via telephone today to update on care. Daughter noted she would be present later in the day.   When presenting at bedside, no family present.  Patient laying in bed on ventilator support via trach. Patient will awaken to voice and nod head to attempt to answer. Quickly falls back asleep though. Noted this provider would plan to follow up hopefully this afternoon once patient's daughter was at bedside.  Discussed care with bedside RN for updates as well. RN will inform if family presents to bedside.   Review of  Systems Patient lethargic  Objective:   Vital Signs:  BP 134/76   Pulse (!) 128   Temp 98.7 F (37.1 C) (Axillary)   Resp 17   Ht 5\' 8"  (1.727 m)   Wt 94.9 kg   SpO2 98%   BMI 31.81 kg/m   Physical Exam: General: chronically ill appearing, trach in place on vent support, will awaken though lethargic  Eyes: no drainage intoed HENT: trach in place Cardiovascular: tachycardia noted  Respiratory: trach on vent support Neuro: lethargic   Imaging:  I personally reviewed recent imaging.   Assessment & Plan:   Assessment: Patient is a 67 year old female with a past medical history of COPD, history of adenocarcinoma of the lung LLL, and panic/anxiety disorder, GERD, and depression who was admitted on 01/12/23 for nausea/vomiting, abdominal pain and diarrhea for 2 weeks. Patient initially admitted to [facility]. Patient was found to have postobstructive pneumonia of the right middle/lower lobe with progressive respiratory failure which required intubation. Had initially been DNR though elected for full code. Imaging showed significant extrinsic compression of the airways in the right lower lobe and middle lobe which is likely malignant and was biopsied. Palliative medicine team consulted to assist with complex medical decision making.   Recommendations/Plan: # Complex medical decision making/goals of care:   -Patient unable to participate in complex medical decision making at this time as not able to actively engage in conversation in light of lethargy when seen this morning. No family present at bedside when seen today. Daughter had already been called by [facility] provider to morning and updated regarding care. PMT to continue following along with patient's medical journey to engage in complex medical decision making with patient and family  as able.   -May need to consider scheduled family meeting with all IDT members included.                 -Prior discussions:  -Patient and daughter  agreed on 5/12 that if patient is unable to make medical decisions for herself, daughter [name] should be called. [name] informs her sister Mia about medical updates.                  - Patient's dignity is very important to her. Things like daily baths would be greatly appreciate to assist in maintaining this. Patient also enjoys [employer] spiritual support.                 -  Code Status: Full Code    # Symptom management                -As per primary team    # Psycho-social/Spiritual Support:  - Support System: daughters - Desire for further Chaplain support:yes, placed consult. Discussed with chaplain today about visiting.    # Discharge Planning: To Be Determined   Discussed with: patient, bedside RN, PCCM  Thank you for allowing the palliative care team to participate in the care Kasyn B Heffley.  Alvester Morin, DO Palliative Care Provider PMT # [phone]  If patient remains symptomatic despite maximum doses, please call PMT at [phone] between 0700 and 1900. Outside of these hours, please call attending, as PMT does not have night coverage.  This provider spent a total of 41 minutes providing patient's care.  Includes review of EMR, discussing care with other staff members involved in patient's medical care, obtaining relevant history and information from patient and/or patient's family, and personal review of imaging and lab work. Greater than 50% of the time was spent counseling and coordinating care related to the above assessment and plan.    *Please note that this is a verbal dictation therefore any spelling or grammatical errors are due to the "[employer] One" system interpretation.

## 2023-03-02 NOTE — Progress Notes (Addendum)
SLP Cancellation Note  Patient Details Name: [name] MRN: [id number] DOB: 1956-07-06   Cancelled treatment:       Reason Eval/Treat Not Completed: Other (comment);Medical issues which prohibited therapy (RN reports pt's feeding tube is clogged, pt is lethargic and is about to get Dilauded for pain, will continue efforts for PMSV on vent to facilitate pt's communication)  Rolena Infante, MS [facility] SLP Acute Rehab Services Office [phone]  Chales Abrahams 03/02/2023, 11:53 AM

## 2023-03-03 DIAGNOSIS — Z7189 Other specified counseling: Secondary | ICD-10-CM | POA: Diagnosis not present

## 2023-03-03 DIAGNOSIS — R5381 Other malaise: Secondary | ICD-10-CM | POA: Diagnosis not present

## 2023-03-03 DIAGNOSIS — Z930 Tracheostomy status: Secondary | ICD-10-CM | POA: Diagnosis not present

## 2023-03-03 DIAGNOSIS — J9601 Acute respiratory failure with hypoxia: Secondary | ICD-10-CM | POA: Diagnosis not present

## 2023-03-03 LAB — GLUCOSE, CAPILLARY
Glucose-Capillary: 82 mg/dL (ref 70–99)
Glucose-Capillary: 84 mg/dL (ref 70–99)
Glucose-Capillary: 86 mg/dL (ref 70–99)
Glucose-Capillary: 92 mg/dL (ref 70–99)
Glucose-Capillary: 93 mg/dL (ref 70–99)
Glucose-Capillary: 94 mg/dL (ref 70–99)
Glucose-Capillary: 98 mg/dL (ref 70–99)

## 2023-03-03 LAB — CBC WITH DIFFERENTIAL/PLATELET
Abs Immature Granulocytes: 0.07 10*3/uL (ref 0.00–0.07)
Basophils Absolute: 0 10*3/uL (ref 0.0–0.1)
Basophils Relative: 0 %
Eosinophils Absolute: 0.4 10*3/uL (ref 0.0–0.5)
Eosinophils Relative: 4 %
HCT: 23 % — ABNORMAL LOW (ref 36.0–46.0)
Hemoglobin: 6.8 g/dL — CL (ref 12.0–15.0)
Immature Granulocytes: 1 %
Lymphocytes Relative: 5 %
Lymphs Abs: 0.5 10*3/uL — ABNORMAL LOW (ref 0.7–4.0)
MCH: 26.6 pg (ref 26.0–34.0)
MCHC: 29.6 g/dL — ABNORMAL LOW (ref 30.0–36.0)
MCV: 89.8 fL (ref 80.0–100.0)
Monocytes Absolute: 0.6 10*3/uL (ref 0.1–1.0)
Monocytes Relative: 6 %
Neutro Abs: 8.8 10*3/uL — ABNORMAL HIGH (ref 1.7–7.7)
Neutrophils Relative %: 84 %
Platelets: 295 10*3/uL (ref 150–400)
RBC: 2.56 MIL/uL — ABNORMAL LOW (ref 3.87–5.11)
RDW: 15.1 % (ref 11.5–15.5)
WBC: 10.5 10*3/uL (ref 4.0–10.5)
nRBC: 0 % (ref 0.0–0.2)

## 2023-03-03 LAB — BASIC METABOLIC PANEL
Anion gap: 5 (ref 5–15)
BUN: 29 mg/dL — ABNORMAL HIGH (ref 8–23)
CO2: 34 mmol/L — ABNORMAL HIGH (ref 22–32)
Calcium: 8.9 mg/dL (ref 8.9–10.3)
Chloride: 102 mmol/L (ref 98–111)
Creatinine, Ser: 0.73 mg/dL (ref 0.44–1.00)
GFR, Estimated: >60 mL/min (ref >60)
Glucose, Bld: 94 mg/dL (ref 70–99)
Potassium: 4.5 mmol/L (ref 3.5–5.1)
Sodium: 141 mmol/L (ref 135–145)

## 2023-03-03 LAB — TYPE AND SCREEN: Antibody Screen: NEGATIVE

## 2023-03-03 LAB — PREPARE RBC (CROSSMATCH)

## 2023-03-03 LAB — BPAM RBC: Blood Product Expiration Date: 202407242359

## 2023-03-03 MED ORDER — CLONAZEPAM 0.5 MG PO TBDP
2.0000 mg | ORAL_TABLET | Freq: Three times a day (TID) | ORAL | Status: DC
  Administered 2023-03-03 – 2023-03-04 (×4): 2 mg via ORAL
  Filled 2023-03-03 (×7): qty 4

## 2023-03-03 MED ORDER — SODIUM CHLORIDE 0.9% IV SOLUTION: Freq: Once | INTRAVENOUS | Status: DC

## 2023-03-03 MED ORDER — DEXMEDETOMIDINE HCL IN NACL 400 MCG/100ML IV SOLN
0.0000 ug/kg/h | INTRAVENOUS | Status: DC
  Administered 2023-03-03 – 2023-03-04 (×2): 0.6 ug/kg/h via INTRAVENOUS
  Filled 2023-03-03 (×2): qty 100

## 2023-03-03 MED ORDER — OLANZAPINE 5 MG PO TBDP
15.0000 mg | ORAL_TABLET | Freq: Every day | ORAL | Status: DC
  Filled 2023-03-03: qty 3

## 2023-03-03 MED ORDER — HYDROMORPHONE HCL 1 MG/ML IJ SOLN
1.0000 mg | Freq: Four times a day (QID) | INTRAMUSCULAR | Status: DC
  Administered 2023-03-03 – 2023-03-05 (×11): 1 mg via INTRAVENOUS
  Filled 2023-03-03 (×10): qty 1

## 2023-03-03 MED ORDER — MIDAZOLAM HCL 2 MG/2ML IJ SOLN
2.0000 mg | INTRAMUSCULAR | Status: DC | PRN
  Administered 2023-03-03 (×3): 2 mg via INTRAVENOUS
  Administered 2023-03-03 – 2023-03-04 (×2): 4 mg via INTRAVENOUS
  Administered 2023-03-04 (×3): 2 mg via INTRAVENOUS
  Filled 2023-03-03: qty 4
  Filled 2023-03-03 (×6): qty 2
  Filled 2023-03-03: qty 4

## 2023-03-03 MED ORDER — DIPHENHYDRAMINE HCL 50 MG/ML IJ SOLN
50.0000 mg | Freq: Four times a day (QID) | INTRAMUSCULAR | Status: DC | PRN
  Administered 2023-03-04 – 2023-03-05 (×4): 50 mg via INTRAVENOUS
  Filled 2023-03-03 (×4): qty 1

## 2023-03-03 MED ORDER — HALOPERIDOL LACTATE 5 MG/ML IJ SOLN
5.0000 mg | Freq: Four times a day (QID) | INTRAMUSCULAR | Status: DC | PRN
  Administered 2023-03-03 – 2023-03-05 (×3): 5 mg via INTRAVENOUS
  Filled 2023-03-03 (×3): qty 1

## 2023-03-03 MED ORDER — HYDROMORPHONE HCL 1 MG/ML IJ SOLN
3.0000 mg | INTRAMUSCULAR | Status: DC | PRN
  Administered 2023-03-03 – 2023-03-05 (×10): 3 mg via INTRAVENOUS
  Filled 2023-03-03 (×9): qty 3

## 2023-03-03 NOTE — Progress Notes (Signed)
NAME:  [name], MRN:  [id number], DOB:  [birth year], LOS: 50 ADMISSION DATE:  01/11/2023, CONSULTATION DATE:  01/16/2023 REFERRING MD: FMTS, CHIEF COMPLAINT: Acute Respiratory Failure in the setting of Lung Cancer, COPD and infiltrate on CXR   History of Present Illness:  67 year old woman, former smoker who presented to [facility] ED 5/1 with nausea, vomiting, diarrhea and abdominal pain for 2 weeks.  Found to have RML and RLL post-obstructive pneumonia with Rt hilar mass, new onset A fib.  She required intubation on 5/06 for hypoxic respiratory failure.  Transferred to [facility] on 5/07 for cancer therapy.  Pertinent Medical History:  COPD, Adenocarcinoma LLL November 2021, Bladder cancer, GERD, Anxiety with panic attacks, Depression, Allergies  Significant Hospital Events: Including procedures, antibiotic start and stop dates in addition to other pertinent events   5/6 Transferred to ICU for acute Respiratory Failure , intubated  5/7 Bronch by Icard. significant extrinsic compression of the airways in the right lower lobe and middle lobe. This is likely malignant obstruction. Bx sent. Transferred to [facility] for rad/onc eval. PICC placed 5/8 Stable still high PEEP/FiO2 needs. Rad/onc consult requested  5/9 start radiation therapy 5/10 Tolerating SBT trial this AM on low-dose fentanyl and Precedex.  Propofol DC'd due to high triglycerides 5/11 Tolerates SBT's, pressure support 10/5 5/12 Off of pressors. Remains on vent, sedated with Precedex/Fentanyl. PMT consulted. 5/13 Weaning on PSV 14/8. > 10L positive over the course of admission. Diuresing. 5/14 Weaning on PSV. Ongoing diuresis. 5/15 Tolerating SBT, PSV 5/5, following commands. Continue diuresis, Foley placed for persistent urinary retention. Extubated, decompensated quickly after requiring reintubation. DHT placed. 5/16 Agitated overnight, c/o throat pain with ETT, RASS 1-2 despite sedation. Failed extubation. Tolerating SBT PSV 10/5, FiO2 35%.  ~1630 desaturated with inability to pass Ballard suction/difficulty bagging; urgent tube exchange completed with bougie and blood clot removed with improvement in oxygenation/ventilation. 5/17 Tracheostomy placed 5/19 Back in atrial fibrillation in am, HR 150s 5/20 Weaned on pressure support 10/5 5/21 Desaturated during weaning. Still on dex and fent gtt 5/23 added fent patch and seroquel had to have NE gtt and midodrine started for drug related hypotension 5/24 Trialing fent gtt off. Foley removed 5/25 3 hrs. trach collar 5/26 Pressure support minimal trach collar 5/28 SIMV/PS, increased pain. Changed oxy fent patch to dilaudid incr seroquel 5/29 radiation. Off dilaudid gtt. Lower dose precedex requirement  5/30 try to wean precedex. Repeat anxiety during/after rad. Incr anxiolytics 6/01 change to pressure control as rest mode 6/4 trial vent valve and able to phonate, trach collar trial for ~1-2 hours surprisingly and wore PMV for 5 minutes and able to phonate well  6/6 intermittent plugging, underwent bronch but required surprisingly little therapeutic aspiration  6/6-6/7 required 3U pRBC transfusion total without clear source of blood loss. Therapeutic AC has been held in advance of g-tube placement on 6/10, remains on ppx lovenox. 6/10 CT abdomen concerning for new nodular lesions in the soft tissue and retroperitoneal space. 6/12 Tolerated TCT with PMV in place x 1 hour, time-limited by anxiety. 6/13 XRT session. Transiently hypotensive overnight, Levo ordered but not started. 6/14 XRT session.  Increased anxiety >> added diprivan and continue precedex 6/17 Enteral pain and anxiety regiment optimized with goal to discontinue propofol.   6/18 tmax 100, hypotensive overnight, responsive to IVF 6/19 completed XRT. D/w onc, no systemic tx options. Rec comfort/hospice 6/20 Palli to see. DNR 6/21 Decision not to transfuse 1 PRBC ordered by elink given lack of sx and GOC  Interim History /  Subjective:   Labs were ordered by Elink and 1 PRBC was subsequently ordered   Objective:  Blood pressure (!) 181/106, pulse (!) 130, temperature 98.1 F (36.7 C), temperature source Oral, resp. rate 19, height 5\' 8"  (1.727 m), weight 92.2 kg, SpO2 93 %.    Vent Mode: PCV FiO2 (%):  [50 %-60 %] 60 % Set Rate:  [24 bmp] 24 bmp PEEP:  [5 cmH20] 5 cmH20 Plateau Pressure:  [20 cmH20-25 cmH20] 20 cmH20   Intake/Output Summary (Last 24 hours) at 03/03/2023 0834 Last data filed at 03/03/2023 0540 Gross per 24 hour  Intake 59.37 ml  Output 945 ml  Net -885.63 ml   Filed Weights   03/01/23 0403 03/02/23 0500 03/03/23 0426  Weight: 96.9 kg 94.9 kg 92.2 kg   Physical Examination: General:  chronically and acutely ill older adult F NAD  HEENT: Trach secure anicteric sclera facial hair  Neuro: Lethargic, awakens and is oriented x2 following commands and asking appropriate questions  CV: tachycardic Pulm: symmetrical chest expansion, mechanically ventilated. Pursed lips breathing intermittently  GI: soft ndnt, + FMS  Extremities: dependent edema. RUE PICC  Skin:pale c/d/w   Resolved Hospital Problem List:  Hyponatremia , Hypotension drug induced from sedation. Vocal cord edema, Post obstructive PNA: Zosyn completed 5/13  Ileus    Assessment & Plan:   NSCLC GOC Discussion // DNR status  Acute respiratory failure w hypoxia and hypercarbia  S/p trach, trach/vent dependence  COPD  Acute metabolic encephalopathy  Anxiety, severe Depression Cancer-related pain  Hypotension - medication related  pAFib, now sinus  Hx HTN HLD  Dysphagia Malnutrition  Urinary retention  Anemia, multifactorial  Severe physical deconditioning // debility  -completed palliative XRT 6/19 and is not a candidate for further systemic tx  -she is profoundly deconditioned and unfortunately efforts at decr MV support need have not been successful, and she is now unable to briefly tolerate SBT. Further  confounded by severe anxiety / air hunger  P: -DNR, anticipate transition to comfort care this weekend  -do not replace NGT and no IR PEG   -will convert her enteral meds to IV or ODT as able  -prioritize sx management  -dc the PRBC ordered overnight  -dc chemical vte ppx  -we do not need daily labs, especially with expected transition to comfort care.  -NE ok if needed to bridge to weekend for comfort transition, but no additional pressors.  -cont foley    Best Practice: (right click and "Reselect all SmartList Selections" daily)   Diet/type: NPO DVT prophylaxis: Lovenox  GI prophylaxis: PPI Lines: Central line - PICC Foley:  Yes  Code Status:  DNR Last date of multidisciplinary goals of care discussion: -- Talked w daughter [name] 6/20 on the phone, she plans to come to the hospital later today and we plan to discuss further where we go from here. She brought up goal of LTAC and I gently said that it is possible [facility] may not be a candidate for this, and in light of Onc update rec no further tx, think we need to revisit overall GOC and consider comfort care.  Signature:   CRITICAL CARE Performed by: Lanier Clam   Total critical care time: 37 minutes  Critical care time was exclusive of separately billable procedures and treating other patients. Critical care was necessary to treat or prevent imminent or life-threatening deterioration.  Critical care was time spent personally by me on the following activities: development of  treatment plan with patient and/or surrogate as well as nursing, discussions with consultants, evaluation of patient's response to treatment, examination of patient, obtaining history from patient or surrogate, ordering and performing treatments and interventions, ordering and review of laboratory studies, ordering and review of radiographic studies, pulse oximetry and re-evaluation of patient's condition.  Tessie Fass MSN, AGACNP-BC [facility]  Pulmonary/Critical Care Medicine Amion for pager  03/03/2023, 8:34 AM

## 2023-03-03 NOTE — Progress Notes (Signed)
PT Cancellation Note  Patient Details Name: [name] MRN: [id number] DOB: [birth year]   Cancelled Treatment:    Reason Eval/Treat Not Completed: Medical issues which prohibited therapy RN reports pt with elevated HR with movement in bed. Recommends holding therapy today.  Also noted GOC critical care note.  Palliative involved and pt to see family this weekend. Pt heading in direction of comfort care.  Will check back on pt likely Monday to determine if acute physical therapy remains appropriate.  Please discontinue PT order over weekend if pt and family desires.   [name] 03/03/2023, 11:20 AM Paulino Door, DPT Physical Therapist Acute Rehabilitation Services Office: [phone]

## 2023-03-03 NOTE — IPAL (Signed)
  Interdisciplinary Goals of Care Family Meeting   Date carried out: 03/03/2023  Location of the meeting: Unit  Member's involved: Nurse Practitioner and Family Member or next of kin  Durable Power of Attorney or acting medical decision maker: daughter [name]     Discussion: We discussed goals of care for [name]  w [name] her husband and son. We talked about the planned transition to comfort care-- tentatively later this weekend after family has visited. She asked if this timeline is firm to which I replied that there is no firm timeline, however, I think the window of time where we can compassionately impact Kathee's life is narrowing and expressed my agreement w existing plan for comfort Sunday.   Talked about care in the interim-- we will focus grossly on comfort now-- no daily labs. Ok to check CBG and replace glucose PRN through the weekend. Not replacing NGT, no indication for IR PEG. Discussed changes to meds (now IV or ODT) and prioritizing optimizing Jacara's symptom management and quality time with family in the coming days.   All questions answered, she is accepting but understandably having a hard time with the nature of this cancer recurrence   Code status:   Code Status: DNR   Disposition: Plan for comfort care in coming days   Time spent for the meeting: 15 min     Lanier Clam, NP  03/03/2023, 5:12 PM

## 2023-03-03 NOTE — Progress Notes (Signed)
  Daily Progress Note   Patient Name: [name]       Date: 03/03/2023 DOB: 07-11-1956  Age: 67 y.o. MRN#: [id number] Attending Physician: Karren Burly, MD Primary Care Physician: Fayette Pho, MD Admit Date: 01/11/2023 Length of Stay: 50 days  Discussed care with primary PCCM team today. Dr. Judeth Horn met with patient's daughter, [name], yesterday afternoon at bedside. Family planning to visit in the coming days and will transition to full comfort once family has has chance to say goodbyes. Now DNR. No requested needs for PMT at this time as goals currently determined. Please reach out if acute needs arise. Thank you.    Alvester Morin, DO Palliative Care Provider PMT # [phone]

## 2023-03-03 NOTE — Radiation Completion Notes (Addendum)
Radiation Oncology         [phone] ________________________________  Name: [name] MRN: [id number]  Date: 03/01/2023  DOB: 1956-09-01  Referring Physician: Audie Box, M.D. Date of Service: 2023-03-03 Radiation Oncologist: Margaretmary Bayley, M.D. [facility]     RADIATION ONCOLOGY END OF TREATMENT NOTE     Diagnosis: 67 y/o woman with obstructing right lower and middle lobe lung mass secondary to presumed NSCLC, pathology pending - On a ventilator      ==========DELIVERED PLANS==========  First Treatment Date: 2023-01-18 - Last Treatment Date: 2023-03-01   Plan Name: Lung_R Site: Bronchus, Right Technique: 3D Mode: Photon Dose Per Fraction: 3 Gy Prescribed Dose (Delivered / Prescribed): 9 Gy / 9 Gy Prescribed Fxs (Delivered / Prescribed): 3 / 3   Plan Name: Lung_R_Bst Site: Bronchus, Right Technique: 3D Mode: Photon Dose Per Fraction: 2 Gy Prescribed Dose (Delivered / Prescribed): 54 Gy / 54 Gy Prescribed Fxs (Delivered / Prescribed): 27 / 27     ==========ON TREATMENT VISIT DATES========== 2023-01-19, 2023-01-27, 2023-02-02, 2023-02-10, 2023-02-17, 2023-02-23   See weekly On Treatment Notes in Epic for details. She tolerated the radiation treatments well but unfortunately her condition worsened despite aggressive therapies and she transitioned to comfort care on 03/01/23.  ------------------------------------------------   Margaretmary Dys, MD [facility]  Radiation Oncology Direct Dial: [phone]  Fax: [phone] [email or web address]  Skype  LinkedIn

## 2023-03-03 NOTE — Progress Notes (Signed)
eLink Physician-Brief Progress Note Patient Name: [name] DOB: September 18, 1955 MRN: [id number]   Date of Service  03/03/2023  HPI/Events of Note  Hemoglobin 6.8, no evidence of active bleeding.                                                                                                                               eICU Interventions  Transfuse 1 Unit of PRBC.        Migdalia Dk 03/03/2023, 5:36 AM

## 2023-03-04 DIAGNOSIS — J9601 Acute respiratory failure with hypoxia: Secondary | ICD-10-CM | POA: Diagnosis not present

## 2023-03-04 LAB — GLUCOSE, CAPILLARY
Glucose-Capillary: 99 mg/dL (ref 70–99)
Glucose-Capillary: 93 mg/dL (ref 70–99)

## 2023-03-04 MED ORDER — ACETAMINOPHEN 10 MG/ML IV SOLN
1000.0000 mg | Freq: Once | INTRAVENOUS | Status: AC
  Administered 2023-03-04: 1000 mg via INTRAVENOUS
  Filled 2023-03-04: qty 100

## 2023-03-04 MED ORDER — MIDAZOLAM HCL 2 MG/2ML IJ SOLN
4.0000 mg | INTRAMUSCULAR | Status: DC | PRN
  Administered 2023-03-04 – 2023-03-05 (×9): 4 mg via INTRAVENOUS
  Filled 2023-03-04 (×11): qty 4

## 2023-03-04 MED ORDER — DEXMEDETOMIDINE HCL IN NACL 200 MCG/50ML IV SOLN
0.0000 ug/kg/h | INTRAVENOUS | Status: DC
  Administered 2023-03-04: 1.2 ug/kg/h via INTRAVENOUS
  Filled 2023-03-04: qty 50

## 2023-03-04 MED ORDER — DEXMEDETOMIDINE HCL IN NACL 400 MCG/100ML IV SOLN
0.0000 ug/kg/h | INTRAVENOUS | Status: DC
  Administered 2023-03-04 – 2023-03-05 (×8): 1.2 ug/kg/h via INTRAVENOUS
  Filled 2023-03-04 (×2): qty 100
  Filled 2023-03-04 (×2): qty 200
  Filled 2023-03-04 (×2): qty 100

## 2023-03-04 NOTE — Progress Notes (Signed)
Patient refused IV tylenol for fever. Educated patient on risks of uncontrolled fever, to which patient nodded that she understood and still refused.

## 2023-03-04 NOTE — Progress Notes (Signed)
Notified Dr. Judeth Horn about pt's high BP w/SBP in the 200s.... No new orders given and will cont to monitor since pt has been very labile.

## 2023-03-04 NOTE — Progress Notes (Signed)
Patient refused her 0600 CBG

## 2023-03-04 NOTE — Progress Notes (Signed)
NAME:  [name], MRN:  [id number], DOB:  27-Jul-1956, LOS: 51 ADMISSION DATE:  01/11/2023, CONSULTATION DATE:  01/16/2023 REFERRING MD: FMTS, CHIEF COMPLAINT: Acute Respiratory Failure in the setting of Lung Cancer, COPD and infiltrate on CXR   History of Present Illness:  67 year old woman, former smoker who presented to [facility] ED 5/1 with nausea, vomiting, diarrhea and abdominal pain for 2 weeks.  Found to have RML and RLL post-obstructive pneumonia with Rt hilar mass, new onset A fib.  She required intubation on 5/06 for hypoxic respiratory failure.  Transferred to [facility] on 5/07 for cancer therapy.  Pertinent Medical History:  COPD, Adenocarcinoma LLL November 2021, Bladder cancer, GERD, Anxiety with panic attacks, Depression, Allergies  Significant Hospital Events: Including procedures, antibiotic start and stop dates in addition to other pertinent events   5/6 Transferred to ICU for acute Respiratory Failure , intubated  5/7 Bronch by Icard. significant extrinsic compression of the airways in the right lower lobe and middle lobe. This is likely malignant obstruction. Bx sent. Transferred to [facility] for rad/onc eval. PICC placed 5/8 Stable still high PEEP/FiO2 needs. Rad/onc consult requested  5/9 start radiation therapy 5/10 Tolerating SBT trial this AM on low-dose fentanyl and Precedex.  Propofol DC'd due to high triglycerides 5/11 Tolerates SBT's, pressure support 10/5 5/12 Off of pressors. Remains on vent, sedated with Precedex/Fentanyl. PMT consulted. 5/13 Weaning on PSV 14/8. > 10L positive over the course of admission. Diuresing. 5/14 Weaning on PSV. Ongoing diuresis. 5/15 Tolerating SBT, PSV 5/5, following commands. Continue diuresis, Foley placed for persistent urinary retention. Extubated, decompensated quickly after requiring reintubation. DHT placed. 5/16 Agitated overnight, c/o throat pain with ETT, RASS 1-2 despite sedation. Failed extubation. Tolerating SBT PSV 10/5, FiO2 35%.  ~1630 desaturated with inability to pass Ballard suction/difficulty bagging; urgent tube exchange completed with bougie and blood clot removed with improvement in oxygenation/ventilation. 5/17 Tracheostomy placed 5/19 Back in atrial fibrillation in am, HR 150s 5/20 Weaned on pressure support 10/5 5/21 Desaturated during weaning. Still on dex and fent gtt 5/23 added fent patch and seroquel had to have NE gtt and midodrine started for drug related hypotension 5/24 Trialing fent gtt off. Foley removed 5/25 3 hrs. trach collar 5/26 Pressure support minimal trach collar 5/28 SIMV/PS, increased pain. Changed oxy fent patch to dilaudid incr seroquel 5/29 radiation. Off dilaudid gtt. Lower dose precedex requirement  5/30 try to wean precedex. Repeat anxiety during/after rad. Incr anxiolytics 6/01 change to pressure control as rest mode 6/4 trial vent valve and able to phonate, trach collar trial for ~1-2 hours surprisingly and wore PMV for 5 minutes and able to phonate well  6/6 intermittent plugging, underwent bronch but required surprisingly little therapeutic aspiration  6/6-6/7 required 3U pRBC transfusion total without clear source of blood loss. Therapeutic AC has been held in advance of g-tube placement on 6/10, remains on ppx lovenox. 6/10 CT abdomen concerning for new nodular lesions in the soft tissue and retroperitoneal space. 6/12 Tolerated TCT with PMV in place x 1 hour, time-limited by anxiety. 6/13 XRT session. Transiently hypotensive overnight, Levo ordered but not started. 6/14 XRT session.  Increased anxiety >> added diprivan and continue precedex 6/17 Enteral pain and anxiety regiment optimized with goal to discontinue propofol.   6/18 tmax 100, hypotensive overnight, responsive to IVF 6/19 completed XRT. D/w onc, no systemic tx options. Rec comfort/hospice 6/20 Palli to see. DNR 6/21 Decision not to transfuse 1 PRBC ordered by elink given lack of sx and GOC  Interim History /  Subjective:   Labs were ordered by Elink and 1 PRBC was subsequently ordered   Objective:  Blood pressure (!) 144/72, pulse (!) 136, temperature (!) 101.1 F (38.4 C), temperature source Axillary, resp. rate (!) 24, height 5\' 8"  (1.727 m), weight 92.2 kg, SpO2 90 %.    Vent Mode: PCV FiO2 (%):  [60 %] 60 % Set Rate:  [24 bmp] 24 bmp PEEP:  [5 cmH20] 5 cmH20 Plateau Pressure:  [20 cmH20-28 cmH20] 20 cmH20   Intake/Output Summary (Last 24 hours) at 03/04/2023 0806 Last data filed at 03/03/2023 2326 Gross per 24 hour  Intake 169.75 ml  Output 725 ml  Net -555.25 ml    Filed Weights   03/01/23 0403 03/02/23 0500 03/03/23 0426  Weight: 96.9 kg 94.9 kg 92.2 kg   Physical Examination: General:  chronically and acutely ill older adult F NAD  HEENT: Trach secure anicteric sclera facial hair  Neuro: Lethargic, awakens and is oriented x2 following commands and asking appropriate questions  CV: tachycardic Pulm: symmetrical chest expansion, mechanically ventilated. Pursed lips breathing intermittently  GI: soft ndnt, + FMS  Extremities: dependent edema. RUE PICC  Skin:pale c/d/w   Resolved Hospital Problem List:  Hyponatremia , Hypotension drug induced from sedation. Vocal cord edema, Post obstructive PNA: Zosyn completed 5/13  Ileus    Assessment & Plan:   Ventilator dependent/hypoxemic respiratory failure: Mix of burden of malignancy as well as neuromuscular weakness with prolonged critical illness as well as severe anxiety. -- PRVC, wean oxygen for O2 sats 90% or greater -- intermittent lasix --Despite multiple efforts recently she has had worsening respiratory failure and no longer tolerating attempts at weaning, I fear she has not rehab a little, recommend comfort measures and lack of progression as well as lack of any options to improve the underlying physiology related to weakness and cancer; family accepting of withdrawal of care   Severe anxiety: Major barrier to liberation  from vent over time requiring multiple sedatives and multiple setbacks. -- Continue IV dexmedetomidine for now, working on slowly weaning this off -- Continue clonazepam 2 mg 3 times sublingual ODT -- Zyprexa ODT -- As needed IV midazolam   Pain: Related to the critical illness is where his cancer presumably. -- Dilaudid scheduled and as needed IV   Severe diffuse weakness due to critical illness --No rehab potential   Lung cancer: -- s/p palliative radiation to end 03/01/2023 -- Concerning for worsening metastatic disease on CT scan 02/19/2023 -- Oncology has weighed in, not a candidate for chemotherapy --Appreciate palliative care assistance   Best Practice: (right click and "Reselect all SmartList Selections" daily)   Diet/type: NPO DVT prophylaxis: Lovenox  GI prophylaxis: PPI Lines: Central line - PICC Foley:  Yes  Code Status:  DNR Last date of multidisciplinary goals of care discussion: 6/21 see IPAL  Signature:   CRITICAL CARE Performed by: Lesia Sago Shayden Bobier   Total critical care time: 31 minutes  Critical care time was exclusive of separately billable procedures and treating other patients. Critical care was necessary to treat or prevent imminent or life-threatening deterioration.  Critical care was time spent personally by me on the following activities: development of treatment plan with patient and/or surrogate as well as nursing, discussions with consultants, evaluation of patient's response to treatment, examination of patient, obtaining history from patient or surrogate, ordering and performing treatments and interventions, ordering and review of laboratory studies, ordering and review of radiographic studies, pulse oximetry and re-evaluation  of patient's condition.  Karren Burly, MD [facility] Pulmonary/Critical Care Medicine Amion for contact  03/04/2023, 8:06 AM

## 2023-03-04 NOTE — Progress Notes (Signed)
Pt refusing meds this morning. Daughter [name]) at bedside   Updated daughter [name]) that pt reported she does not want to live any more and made daughter aware of this. Tonya asked pt if she could wait before making this decision since other daughters are coming.   ALSO [name] and pt are requesting female nurses ONLY. Would not elaborate on this.   Notified Geophysical data processor). Asher Muir spoke w/daughter and pt. She will try to change assignments but for now, it's not possible...   Also, Asher Muir reports that pt and daughter deny pt made any comments of wanting to die.

## 2023-03-05 DIAGNOSIS — J9601 Acute respiratory failure with hypoxia: Secondary | ICD-10-CM | POA: Diagnosis not present

## 2023-03-05 LAB — GLUCOSE, CAPILLARY
Glucose-Capillary: 105 mg/dL — ABNORMAL HIGH (ref 70–99)
Glucose-Capillary: 87 mg/dL (ref 70–99)
Glucose-Capillary: 87 mg/dL (ref 70–99)

## 2023-03-05 MED ORDER — ACETAMINOPHEN 325 MG PO TABS: 650.0000 mg | ORAL_TABLET | Freq: Four times a day (QID) | ORAL | Status: DC | PRN

## 2023-03-05 MED ORDER — HYDROMORPHONE HCL-NACL 50-0.9 MG/50ML-% IV SOLN
0.0000 mg/h | INTRAVENOUS | Status: DC
  Administered 2023-03-05: 3 mg/h via INTRAVENOUS
  Administered 2023-03-05: 7.5 mg/h via INTRAVENOUS
  Filled 2023-03-05 (×4): qty 50

## 2023-03-05 MED ORDER — ACETAMINOPHEN 650 MG RE SUPP: 650.0000 mg | Freq: Four times a day (QID) | RECTAL | Status: DC | PRN

## 2023-03-05 MED ORDER — SODIUM CHLORIDE 0.9 % IV SOLN
INTRAVENOUS | Status: DC
Start: 2023-03-05 — End: 2023-03-05

## 2023-03-05 MED ORDER — GLYCOPYRROLATE 1 MG PO TABS: 1.0000 mg | ORAL_TABLET | ORAL | Status: DC | PRN

## 2023-03-05 MED ORDER — POLYVINYL ALCOHOL 1.4 % OP SOLN: 1.0000 [drp] | Freq: Four times a day (QID) | OPHTHALMIC | Status: DC | PRN

## 2023-03-05 MED ORDER — HYDROMORPHONE BOLUS VIA INFUSION
5.0000 mg | INTRAVENOUS | Status: DC | PRN
  Administered 2023-03-05: 5 mg via INTRAVENOUS

## 2023-03-05 MED ORDER — GLYCOPYRROLATE 0.2 MG/ML IJ SOLN: 0.2000 mg | INTRAMUSCULAR | Status: DC | PRN

## 2023-03-05 MED ORDER — MIDAZOLAM HCL 2 MG/2ML IJ SOLN: 4.0000 mg | Freq: Once | INTRAMUSCULAR | Status: DC

## 2023-03-05 MED ORDER — GLYCOPYRROLATE 0.2 MG/ML IJ SOLN
0.2000 mg | INTRAMUSCULAR | Status: DC | PRN
  Administered 2023-03-05: 0.2 mg via INTRAVENOUS
  Filled 2023-03-05: qty 1

## 2023-03-05 MED ORDER — MIDAZOLAM HCL 2 MG/2ML IJ SOLN
4.0000 mg | INTRAMUSCULAR | Status: DC | PRN
  Administered 2023-03-05: 4 mg via INTRAVENOUS
  Filled 2023-03-05: qty 4

## 2023-03-06 ENCOUNTER — Telehealth: Payer: Self-pay | Admitting: Family Medicine

## 2023-03-06 NOTE — Telephone Encounter (Signed)
Saw the note regarding Ms. Banas's passing. Called daughter [name] to offer support. I am hopeful to be able to attend her funeral.   Fayette Pho, MD

## 2023-03-07 LAB — TYPE AND SCREEN
ABO/RH(D): O POS
Unit division: 0

## 2023-03-07 LAB — BPAM RBC: Unit Type and Rh: 5100

## 2023-03-08 DIAGNOSIS — Z66 Do not resuscitate: Secondary | ICD-10-CM | POA: Insufficient documentation

## 2023-03-08 DIAGNOSIS — Z515 Encounter for palliative care: Secondary | ICD-10-CM

## 2023-03-13 NOTE — Death Summary Note (Signed)
DEATH SUMMARY   Patient Details  Name: [name] MRN: [id number] DOB: 07/07/1956  Admission/Discharge Information   Admit Date:  01/16/2023  Date of Death: Date of Death: 03-10-23  Time of Death: Time of Death: 03/27/99  Length of Stay: 27-Mar-2051  Referring Physician: Fayette Pho, MD   Reason(s) for Hospitalization  Acute on chronic hypoxemic respiratory failure  Diagnoses  Preliminary cause of death: Respiratory failure due to metastatic lung cancer and inability to wean from ventilator Secondary Diagnoses (including complications and co-morbidities):  Principal Problem:   Acute hypoxic respiratory failure (HCC) - secondary to CAP Active Problems:   Adenopathy   Anxiety   Respiratory distress   Pneumonia of right lower lobe due to infectious organism   Atrial fibrillation (HCC)   Protein-calorie malnutrition, severe   Vocal cord dysfunction   Acute respiratory failure with hypoxia (HCC)   Respiratory failure (HCC)   Airway obstruction   Need for emotional support   Goals of care, counseling/discussion   Palliative care encounter   Counseling and coordination of care   Tracheostomy in place (HCC)   Anemia   Hypotension   Physical deconditioning   Ventilator dependent (HCC)   Tracheostomy dependence (HCC)   Physical debility   Acute respiratory failure with hypoxia and hypercarbia (HCC)   DNR (do not resuscitate)   Brief Hospital Course (including significant findings, care, treatment, and services provided and events leading to death)  [name] is a 67 y.o. year old female who former smoker, prior history of lung cancer, who presented to [facility] ED 5/1 with nausea, vomiting, diarrhea and abdominal pain for 2 weeks. Found to have RML and RLL post-obstructive pneumonia with Rt hilar mass, new onset A fib.    She required intubation on 5/06 for hypoxic respiratory failure. Transferred to [facility] on 5/07 for cancer therapy.  Biopsy of lymphadenopathy 5/7 consistent with  recurrence of cancer, non-small cell lung cancer.  Tracheostomy was performed 01/27/2023.  Despite aggressive medical therapies including completion total radiation therapy to the hilar mass treatments of various complications, aggressive diuresis, very aggressive palliation of severe comorbid anxiety she made no improvement.  In fact she had worsening respiratory mechanics hypoxemia over time.  She was unable to wean from the ventilator despite aggressive efforts.  She would not tolerate pressure support much less trach collar.  She had a CT scan 02/19/2023 that demonstrated metastatic disease.  Given her prolonged illness and inability to wean from the ventilator, she was deemed not to to have rehabilitation potential.  Oncology was consulted and stated she was not a candidate for additional cancer directed therapies.  Goals of care were discussed with her daughter [name] primarily.  She with the assistance of family came to the consensus that the patient would not want to go through with additional treatment especially given the futile nature of ongoing care given inability to treat cancer.  They elected for DNR and comfort measures only.  Family was given time to visit.  She was removed from ventilator support on the evening of 03-10-23 and subsequently passed away surrounded by loved ones.    Pertinent Labs and Studies  Significant Diagnostic Studies DG Chest Port 1 View  Result Date: 02/28/2023 CLINICAL DATA:  Acute respiratory failure EXAM: PORTABLE CHEST 1 VIEW COMPARISON:  02/25/2023 FINDINGS: Cardiac shadow is stable. Weighted feeding catheter is noted in the stomach. Tracheostomy tube is noted in place. Right PICC is seen in the mid superior vena cava. Central vascular congestion is  noted with bibasilar airspace opacities left worse than right. Some improved aeration in the right base is noted. IMPRESSION: Tubes and lines as described. Persistent but slightly improved basilar airspace opacities.  Electronically Signed   By: Alcide Clever M.D.   On: 02/28/2023 21:55   DG Chest Port 1 View  Result Date: 02/25/2023 CLINICAL DATA:  24401 Respiratory failure (HCC) [phone] EXAM: PORTABLE CHEST - 1 VIEW COMPARISON:  02/17/2023 FINDINGS: Tracheostomy, right arm PICC, and feeding tube remain in place. Perihilar and bibasilar airspace opacities, increased on the right since prior study. Heart size upper limits normal. Blunting of the right lateral costophrenic angle. Cervical fixation hardware partially visualized. IMPRESSION: Increasing right basilar airspace disease.  If Electronically Signed   By: Corlis Leak M.D.   On: 02/25/2023 08:07   DG Abd 1 View  Result Date: 02/24/2023 CLINICAL DATA:  Abdominal distention EXAM: ABDOMEN - 1 VIEW COMPARISON:  02/21/2023 FINDINGS: Bowel gas pattern is nonspecific. Feeding tube tip is seen in the region of body of stomach. Urinary bladder is distended. IMPRESSION: Nonspecific bowel gas pattern. Tip of feeding tube is seen in the stomach. Electronically Signed   By: Ernie Avena M.D.   On: 02/24/2023 14:37   DG Abd 1 View  Result Date: 02/21/2023 CLINICAL DATA:  Antral tube placement EXAM: ABDOMEN - 1 VIEW COMPARISON:  02/21/2023 FINDINGS: Soft feeding tube enters the stomach, with its tip in the body of the stomach along the greater curvature. IMPRESSION: Soft feeding tube tip in the body of the stomach along the greater curvature. Electronically Signed   By: Paulina Fusi M.D.   On: 02/21/2023 13:26   ECHOCARDIOGRAM LIMITED  Result Date: 02/21/2023    ECHOCARDIOGRAM LIMITED REPORT   Patient Name:   [name] [facility] Date of Exam: 02/21/2023 Medical Rec #:  [id number]      Height:       68.0 in Accession #:    [id number]     Weight:       198.6 lb Date of Birth:  [birth year]      BSA:          2.038 m Patient Age:    67 years       BP:           125/62 mmHg Patient Gender: F              HR:           94 bpm. Exam Location:  Inpatient Procedure: Limited Echo,  Cardiac Doppler and Color Doppler Indications:    I31.3 Pericardial effusion (noninflammatory)  History:        Patient has prior history of Echocardiogram examinations, most                 recent 01/12/2023. Abnormal ECG, COPD, Arrythmias:Atrial                 Fibrillation, Signs/Symptoms:Shortness of Breath and Dyspnea;                 Risk Factors:Hypertension and Former Smoker. Metastatic cancer.  Sonographer:    Sheralyn Boatman RDCS Referring Phys: [id number]  Sonographer Comments: Echo performed with patient supine and on artificial respirator. Patient in high Fowler's position on vent. IMPRESSIONS  1. Small pericardial effusion that is mostly posterior to the LV. No tamponade. Slightly larger on this study, but still small.  2. Left ventricular ejection fraction, by estimation, is 60 to 65%. The left ventricle  has normal function. The left ventricle has no regional wall motion abnormalities. Left ventricular diastolic parameters are consistent with Grade I diastolic dysfunction (impaired relaxation).  3. Right ventricular systolic function is normal. The right ventricular size is normal. Tricuspid regurgitation signal is inadequate for assessing PA pressure.  4. A small pericardial effusion is present. The pericardial effusion is posterior to the left ventricle. There is no evidence of cardiac tamponade.  5. The mitral valve is grossly normal. No evidence of mitral valve regurgitation. No evidence of mitral stenosis.  6. The aortic valve is tricuspid. Aortic valve regurgitation is mild. No aortic stenosis is present. FINDINGS  Left Ventricle: Left ventricular ejection fraction, by estimation, is 60 to 65%. The left ventricle has normal function. The left ventricle has no regional wall motion abnormalities. The left ventricular internal cavity size was normal in size. There is  no left ventricular hypertrophy. Left ventricular diastolic parameters are consistent with Grade I diastolic dysfunction (impaired  relaxation). Right Ventricle: The right ventricular size is normal. No increase in right ventricular wall thickness. Right ventricular systolic function is normal. Tricuspid regurgitation signal is inadequate for assessing PA pressure. Pericardium: A small pericardial effusion is present. The pericardial effusion is posterior to the left ventricle. There is no evidence of cardiac tamponade. Presence of epicardial fat layer. Mitral Valve: The mitral valve is grossly normal. No evidence of mitral valve stenosis. Tricuspid Valve: The tricuspid valve is grossly normal. Tricuspid valve regurgitation is trivial. No evidence of tricuspid stenosis. Aortic Valve: The aortic valve is tricuspid. Aortic valve regurgitation is mild. No aortic stenosis is present. Aorta: The aortic root and ascending aorta are structurally normal, with no evidence of dilitation. Venous: The inferior vena cava was not well visualized. Additional Comments: There is a small pleural effusion in the left lateral region. Spectral Doppler performed. Color Doppler performed.  LEFT VENTRICLE PLAX 2D LVIDd:         4.20 cm LVIDs:         2.70 cm LV PW:         1.10 cm LV IVS:        1.10 cm  IVC IVC diam: 3.00 cm  AORTA Ao Asc diam: 3.10 cm Lennie Odor MD Electronically signed by Lennie Odor MD Signature Date/Time: 02/21/2023/9:37:31 AM    Final    DG Abd 1 View  Result Date: 02/21/2023 CLINICAL DATA:  NG tube placement EXAM: ABDOMEN - 1 VIEW COMPARISON:  Previous studies including the examination of 02/20/2023 FINDINGS: Tip of enteric tube is noted in region of gastroesophageal junction. There is interval cephalad migration of the tip of enteric tube. Bowel gas pattern is nonspecific. Gas is present in the lumen of nondilated small bowel loops. IMPRESSION: There is interval withdrawal of enteric tube with its tip at the gastroesophageal junction in the current study. Electronically Signed   By: Ernie Avena M.D.   On: 02/21/2023 09:17   DG  Abd 1 View  Result Date: 02/20/2023 CLINICAL DATA:  Confirm OG tube placement EXAM: ABDOMEN - 1 VIEW COMPARISON:  CT 02/19/2023 FINDINGS: Limited field of view for tube placement. An enteric tube is present with tip projecting over the left upper quadrant consistent with location in the body of the stomach. Visualized bowel gas pattern is normal. Linear atelectasis in the lung bases. IMPRESSION: Enteric tube tip projects over the left upper quadrant consistent with location in the body of the stomach. Electronically Signed   By: Marisa Cyphers.D.  On: 02/20/2023 19:20   IR Naso G Tube Plc W/FL W/Rad  Result Date: 02/20/2023 CLINICAL DATA:  Respiratory failure, lung carcinoma and need for percutaneous gastrostomy tube. EXAM: IR FLUORO RM 0-60 MIN; NG/OG TUBE BY MD MEDICATIONS: None CONTRAST:  None FLUOROSCOPY: Radiation Exposure Index (as provided by the fluoroscopic device): 12.0 mGy Kerma PROCEDURE: The patient was brought to the interventional suite in anticipation of placing a gastrostomy tube. A 5 French orogastric catheter was placed through the mouth and into the stomach under fluoroscopic guidance. The stomach was insufflated with air under fluoroscopy and fluoroscopic spot images saved. The orogastric tube was then removed and the procedure aborted. FINDINGS: There is a significant change in appearance of bowel gas pattern under fluoroscopy compared to the CT study yesterday. The distal transverse colon and splenic flexure of the colon as well as multiple small bowel loops are now interposed between the stomach and abdominal wall and are also distended with air, likely reflecting ileus. There is no safe window to allow for percutaneous gastrostomy tube placement at this time. IMPRESSION: Aborted gastrostomy tube placement due to significant change in bowel gas pattern compared to the CT yesterday. The distal transverse colon and splenic flexure of the colon as well as multiple small bowel loops  are now interposed between the stomach and abdominal wall and are also distended with air, likely reflecting ileus. There is no safe window to allow for percutaneous gastrostomy tube placement at this time. Electronically Signed   By: Irish Lack M.D.   On: 02/20/2023 17:06   IR Fluoro Rm 30-60 Min  Result Date: 02/20/2023 CLINICAL DATA:  Respiratory failure, lung carcinoma and need for percutaneous gastrostomy tube. EXAM: IR FLUORO RM 0-60 MIN; NG/OG TUBE BY MD MEDICATIONS: None CONTRAST:  None FLUOROSCOPY: Radiation Exposure Index (as provided by the fluoroscopic device): 12.0 mGy Kerma PROCEDURE: The patient was brought to the interventional suite in anticipation of placing a gastrostomy tube. A 5 French orogastric catheter was placed through the mouth and into the stomach under fluoroscopic guidance. The stomach was insufflated with air under fluoroscopy and fluoroscopic spot images saved. The orogastric tube was then removed and the procedure aborted. FINDINGS: There is a significant change in appearance of bowel gas pattern under fluoroscopy compared to the CT study yesterday. The distal transverse colon and splenic flexure of the colon as well as multiple small bowel loops are now interposed between the stomach and abdominal wall and are also distended with air, likely reflecting ileus. There is no safe window to allow for percutaneous gastrostomy tube placement at this time. IMPRESSION: Aborted gastrostomy tube placement due to significant change in bowel gas pattern compared to the CT yesterday. The distal transverse colon and splenic flexure of the colon as well as multiple small bowel loops are now interposed between the stomach and abdominal wall and are also distended with air, likely reflecting ileus. There is no safe window to allow for percutaneous gastrostomy tube placement at this time. Electronically Signed   By: Irish Lack M.D.   On: 02/20/2023 17:06   CT ABDOMEN WO  CONTRAST  Result Date: 02/20/2023 CLINICAL DATA:  Dysphagia. Evaluate anatomy prior to gastrostomy tube placement. Known lung cancer. EXAM: CT ABDOMEN WITHOUT CONTRAST TECHNIQUE: Multidetector CT imaging of the abdomen was performed following the standard protocol without IV contrast. RADIATION DOSE REDUCTION: This exam was performed according to the departmental dose-optimization program which includes automated exposure control, adjustment of the mA and/or kV according to patient size  and/or use of iterative reconstruction technique. COMPARISON:  CT scan the chest 01/16/2023; PET-CT 12/16/2022 FINDINGS: Lower chest: Severe bullous emphysema in the periphery of the right lower lobe. Right infrahilar mass again noted and better demonstrated on prior PET-CT imaging. Probable metastatic nodule in the inferior right lower lobe measuring up to 1.9 cm. Small bilateral pleural effusions. Overall improved infiltrative and consolidative airspace disease in the right and left lower lobes. The intracardiac blood pool is hypodense relative to the adjacent myocardium consistent with anemia. Moderate pericardial effusion. Hepatobiliary: No focal liver abnormality is seen. No gallstones, gallbladder wall thickening, or biliary dilatation. Pancreas: Unremarkable. No pancreatic ductal dilatation or surrounding inflammatory changes. Spleen: No splenic injury or perisplenic hematoma. Adrenals/Urinary Tract: Normal adrenal glands. Chronic right hydronephrosis secondary to UPJ obstruction simple cyst exophytic from the upper pole of the right kidney. No hydronephrosis on the left. Stomach/Bowel: Normal gastric anatomy. Feeding tube present in the body of the stomach. Colonic diverticular disease without CT evidence of active inflammation. No evidence of bowel obstruction. Vascular/Lymphatic: Limited evaluation in the absence of intravenous contrast. Atherosclerotic calcifications visualized in the abdominal aorta. Other: New  subcutaneous nodule along the right lateral abdominal wall measures 0.8 cm (image 40 series 2). New mass adjacent to the pancreas and just anterior to the left pararenal space measures 2 cm (image 35 series 2). Both likely represent metastatic implants. Musculoskeletal: No acute or significant osseous findings. IMPRESSION: 1. Anatomy is suitable for percutaneous gastrostomy tube placement. 2. Progressive metastatic lung cancer with new subcutaneous and left upper abdominal/retroperitoneal soft tissue nodules. 3. Improved pneumonia/aspiration with some residual consolidative airspace disease in the left lower lobe when compared to prior imaging from 01/16/2023. 4. Persistent bilateral pleural effusions. 5. Moderate pericardial effusion. 6. The intracardiac blood pool is hypodense relative to the adjacent myocardium consistent with anemia. 7. Right hilar mass and right lower lobe pulmonary nodule consistent with known lung cancer. 8. Severe bullous pulmonary emphysema on the right. 9. Aortic Atherosclerosis (ICD10-I70.0) and Emphysema (ICD10-J43.9). Electronically Signed   By: Malachy Moan M.D.   On: 02/20/2023 07:41   DG CHEST PORT 1 VIEW  Result Date: 02/17/2023 CLINICAL DATA:  Hypoxia. EXAM: PORTABLE CHEST 1 VIEW COMPARISON:  February 16, 2023. FINDINGS: Stable cardiomediastinal silhouette. Tracheostomy and feeding tubes are unchanged in position. Right-sided PICC line is unchanged. Stable bibasilar opacities are noted concerning for atelectasis or pneumonia. Small pleural effusions may be present. Bony thorax is unremarkable. IMPRESSION: Stable support apparatus. Stable bibasilar opacities as described above. Electronically Signed   By: Lupita Raider M.D.   On: 02/17/2023 08:29   DG CHEST PORT 1 VIEW  Result Date: 02/16/2023 CLINICAL DATA:  Hypoxia, respiratory failure. EXAM: PORTABLE CHEST 1 VIEW COMPARISON:  02/15/2023. FINDINGS: The heart size and mediastinal contours are stable. The pulmonary  vasculature is distended. Interstitial prominence and strandy airspace opacities are noted bilaterally. There are small bilateral pleural effusions. No pneumothorax. A tracheostomy tube terminates 4.2 cm above the carina. An enteric tube courses over the stomach and out of the field of view. Cervical spinal fusion hardware is noted. IMPRESSION: 1. Cardiomegaly with distended pulmonary vasculature. 2. Interstitial and airspace opacities in the lungs bilaterally, slightly increased on the right. 3. Small bilateral pleural effusions. Electronically Signed   By: Thornell Sartorius M.D.   On: 02/16/2023 20:19   DG CHEST PORT 1 VIEW  Result Date: 02/15/2023 CLINICAL DATA:  Hypoxia. EXAM: PORTABLE CHEST 1 VIEW COMPARISON:  February 13, 2023. FINDINGS: Stable cardiomegaly. Tracheostomy  tube is unchanged. Feeding tube is seen entering stomach. Right-sided PICC line is unchanged. Stable bibasilar subsegmental atelectasis is noted with probable small pleural effusions. Bony thorax is unremarkable. IMPRESSION: Stable support apparatus. Stable bibasilar opacities as described above. Electronically Signed   By: Lupita Raider M.D.   On: 02/15/2023 11:38   DG Chest Port 1 View  Result Date: 02/13/2023 CLINICAL DATA:  44034 Respiratory failure (HCC) [phone] EXAM: PORTABLE CHEST 1 VIEW COMPARISON:  CXR 02/09/23 FINDINGS: Weighted enteric tube courses below diaphragm is visualized to the level of the stomach. Right-sided PICC with the tip in the lower SVC. Cardiomegaly. Small bilateral pleural effusions, increased on the right. There are prominent bilateral interstitial opacities are favored to represent mild pulmonary edema. No radiographically apparent displaced rib fractures. Visualized upper abdomen is unremarkable. Partially visualized cervical spinal fusion hardware in place. Degenerative changes of the bilateral glenohumeral joints. IMPRESSION: 1. Cardiomegaly with small bilateral pleural effusions, slightly increased on the right.  2. Prominent bilateral interstitial opacities are favored to represent mild pulmonary edema. Electronically Signed   By: Lorenza Cambridge M.D.   On: 02/13/2023 07:38   DG Chest 1 View  Result Date: 02/09/2023 CLINICAL DATA:  67 year old female with pneumonia, lung cancer. EXAM: CHEST  1 VIEW COMPARISON:  Portable chest 01/30/2023 and earlier. Most recent chest CT, CTA 01/16/2023. FINDINGS: Portable AP semi upright view at 0935 hours. Stable tracheostomy. Enteric feeding tube tip projects at the mid to distal gastric body. Increased gas within the stomach now. Stable right PICC line. Ongoing abnormal bilateral lung base opacity, mildly improved on the right since 01/30/2023. Stable cardiac size and mediastinal contours. No superimposed pneumothorax or pulmonary edema. Improved costophrenic angle ventilation, small pleural effusions may have resolved. No areas of worsening ventilation. Stable visualized osseous structures. Prior cervical ACDF. IMPRESSION: 1. Ongoing bibasilar lung opacity with only mildly improved ventilation since 01/30/2023. No new cardiopulmonary abnormality. 2. Stable lines and tubes; enteric feeding tube terminates at the mid or distal 3rd stomach. Electronically Signed   By: Odessa Fleming M.D.   On: 02/09/2023 11:27    Microbiology No results found for this or any previous visit (from the past 240 hour(s)).  Lab Basic Metabolic Panel: Recent Labs  Lab 03/02/23 0500 03/03/23 0432  NA 138 141  K 4.2 4.5  CL 100 102  CO2 32 34*  GLUCOSE 157* 94  BUN 34* 29*  CREATININE 0.83 0.73  CALCIUM 8.3* 8.9  MG 2.2  --   PHOS 4.1  --    Liver Function Tests: Recent Labs  Lab 03/02/23 0500  ALBUMIN 2.3*   No results for input(s): "LIPASE", "AMYLASE" in the last 168 hours. No results for input(s): "AMMONIA" in the last 168 hours. CBC: Recent Labs  Lab 03/02/23 0500 03/03/23 0432  WBC 10.5 10.5  NEUTROABS 8.6* 8.8*  HGB 7.1* 6.8*  HCT 23.8* 23.0*  MCV 89.1 89.8  PLT 323 295    Cardiac Enzymes: No results for input(s): "CKTOTAL", "CKMB", "CKMBINDEX", "TROPONINI" in the last 168 hours. Sepsis Labs: Recent Labs  Lab 03/02/23 0500 03/03/23 0432  WBC 10.5 10.5    Procedures/Operations  As per EMR   Lesia Sago Samaia Iwata 03/08/2023, 10:47 AM

## 2023-03-13 NOTE — Progress Notes (Signed)
NAME:  [name], MRN:  [id number], DOB:  1956-08-12, LOS: 52 ADMISSION DATE:  01/11/2023, CONSULTATION DATE:  01/16/2023 REFERRING MD: FMTS, CHIEF COMPLAINT: Acute Respiratory Failure in the setting of Lung Cancer, COPD and infiltrate on CXR   History of Present Illness:  67 year old woman, former smoker who presented to [facility] ED 5/1 with nausea, vomiting, diarrhea and abdominal pain for 2 weeks.  Found to have RML and RLL post-obstructive pneumonia with Rt hilar mass, new onset A fib.  She required intubation on 5/06 for hypoxic respiratory failure.  Transferred to [facility] on Feb 01, 2023 for cancer therapy.  Pertinent Medical History:  COPD, Adenocarcinoma LLL November 2021, Bladder cancer, GERD, Anxiety with panic attacks, Depression, Allergies  Significant Hospital Events: Including procedures, antibiotic start and stop dates in addition to other pertinent events   5/6 Transferred to ICU for acute Respiratory Failure , intubated  01-Feb-2023 Bronch by Icard. significant extrinsic compression of the airways in the right lower lobe and middle lobe. This is likely malignant obstruction. Bx sent. Transferred to [facility] for rad/onc eval. PICC placed 5/8 Stable still high PEEP/FiO2 needs. Rad/onc consult requested  5/9 start radiation therapy 5/10 Tolerating SBT trial this AM on low-dose fentanyl and Precedex.  Propofol DC'd due to high triglycerides 5/11 Tolerates SBT's, pressure support 10/5 5/12 Off of pressors. Remains on vent, sedated with Precedex/Fentanyl. PMT consulted. 5/13 Weaning on PSV 14/8. > 10L positive over the course of admission. Diuresing. 5/14 Weaning on PSV. Ongoing diuresis. 5/15 Tolerating SBT, PSV 5/5, following commands. Continue diuresis, Foley placed for persistent urinary retention. Extubated, decompensated quickly after requiring reintubation. DHT placed. 5/16 Agitated overnight, c/o throat pain with ETT, RASS 1-2 despite sedation. Failed extubation. Tolerating SBT PSV 10/5, FiO2 35%.  ~1630 desaturated with inability to pass [place] suction/difficulty bagging; urgent tube exchange completed with bougie and blood clot removed with improvement in oxygenation/ventilation. 5/17 Tracheostomy placed 5/19 Back in atrial fibrillation in am, HR 150s 5/20 Weaned on pressure support 10/5 5/21 Desaturated during weaning. Still on dex and fent gtt 5/23 added fent patch and seroquel had to have NE gtt and midodrine started for drug related hypotension 5/24 Trialing fent gtt off. Foley removed 5/25 3 hrs. trach collar 5/26 Pressure support minimal trach collar 5/28 SIMV/PS, increased pain. Changed oxy fent patch to dilaudid incr seroquel 5/29 radiation. Off dilaudid gtt. Lower dose precedex requirement  5/30 try to wean precedex. Repeat anxiety during/after rad. Incr anxiolytics 6/01 change to pressure control as rest mode 6/4 trial vent valve and able to phonate, trach collar trial for ~1-2 hours surprisingly and wore PMV for 5 minutes and able to phonate well  6/6 intermittent plugging, underwent bronch but required surprisingly little therapeutic aspiration  6/6-6/7 required 3U pRBC transfusion total without clear source of blood loss. Therapeutic AC has been held in advance of g-tube placement on 6/10, remains on ppx lovenox. 6/10 CT abdomen concerning for new nodular lesions in the soft tissue and retroperitoneal space. 6/12 Tolerated TCT with PMV in place x 1 hour, time-limited by anxiety. 6/13 XRT session. Transiently hypotensive overnight, Levo ordered but not started. 6/14 XRT session.  Increased anxiety >> added diprivan and continue precedex 6/17 Enteral pain and anxiety regiment optimized with goal to discontinue propofol.   6/18 tmax 100, hypotensive overnight, responsive to IVF 6/19 completed XRT. D/w onc, no systemic tx options. Rec comfort/hospice 6/20 Palli to see. DNR 6/21 Decision not to transfuse 1 PRBC ordered by elink given lack of sx and GOC  6/22 no significant  changes  Interim History / Subjective:   Intermittent tachycardia.  Medicines working well to control agitation.  Intermittently febrile.  Objective:  Blood pressure (!) 150/69, pulse (!) 158, temperature (!) 101.1 F (38.4 C), temperature source Axillary, resp. rate (!) 27, height 5\' 8"  (1.727 m), weight 92.2 kg, SpO2 97 %.    Vent Mode: PCV FiO2 (%):  [60 %-100 %] 100 % Set Rate:  [24 bmp] 24 bmp PEEP:  [5 cmH20] 5 cmH20 Plateau Pressure:  [20 cmH20-25 cmH20] 25 cmH20   Intake/Output Summary (Last 24 hours) at 02/22/2023 0914 Last data filed at 03/07/2023 8295 Gross per 24 hour  Intake 538.15 ml  Output 1600 ml  Net -1061.85 ml    Filed Weights   03/01/23 0403 03/02/23 0500 03/03/23 0426  Weight: 96.9 kg 94.9 kg 92.2 kg   Physical Examination: General:  chronically and acutely ill older adult F NAD  HEENT: Trach secure anicteric sclera facial hair  Neuro: Lethargic, awakens and is oriented x2 following commands and asking appropriate questions  CV: tachycardic Pulm: symmetrical chest expansion, mechanically ventilated. Pursed lips breathing intermittently  GI: soft ndnt, + FMS  Extremities: dependent edema. RUE PICC  Skin:pale c/d/w   Resolved Hospital Problem List:  Hyponatremia , Hypotension drug induced from sedation. Vocal cord edema, Post obstructive PNA: Zosyn completed 5/13  Ileus    Assessment & Plan:   Ventilator dependent/hypoxemic respiratory failure: Mix of burden of malignancy as well as neuromuscular weakness with prolonged critical illness as well as severe anxiety. -- PRVC, wean oxygen for O2 sats 90% or greater -- intermittent lasix --Despite multiple efforts recently she has had worsening respiratory failure and no longer tolerating attempts at weaning, I fear she has not rehab a little, recommend comfort measures and lack of progression as well as lack of any options to improve the underlying physiology related to weakness and cancer; family  accepting of withdrawal of care   Severe anxiety: Major barrier to liberation from vent over time requiring multiple sedatives and multiple setbacks. -- Continue IV dexmedetomidine for now -- Continue clonazepam 2 mg 3 times sublingual ODT -- Zyprexa ODT -- As needed IV midazolam   Pain: Related to the critical illness , cancer. -- Dilaudid scheduled and as needed IV   Severe diffuse weakness due to critical illness --No rehab potential   Lung cancer: -- s/p palliative radiation to end 03/01/2023 -- Concerning for worsening metastatic disease on CT scan 02/19/2023 -- Oncology has weighed in, not a candidate for chemotherapy --Appreciate palliative care assistance   Best Practice: (right click and "Reselect all SmartList Selections" daily)   Diet/type: NPO DVT prophylaxis: Lovenox  GI prophylaxis: PPI Lines: Central line - PICC Foley:  Yes  Code Status:  DNR Last date of multidisciplinary goals of care discussion: 6/21 see IPAL, have recommended withdrawal of care by 6/23 and recommended having friends, families, loved ones visit if they want to  Signature:   CRITICAL CARE Performed by: Lesia Sago [place] Lasecki   Total critical care time: 31 minutes  Critical care time was exclusive of separately billable procedures and treating other patients. Critical care was necessary to treat or prevent imminent or life-threatening deterioration.  Critical care was time spent personally by me on the following activities: development of treatment plan with patient and/or surrogate as well as nursing, discussions with consultants, evaluation of patient's response to treatment, examination of patient, obtaining history from patient or surrogate, ordering and performing treatments and interventions,  ordering and review of laboratory studies, ordering and review of radiographic studies, pulse oximetry and re-evaluation of patient's condition.  Karren Burly, MD [facility] Pulmonary/Critical  Care Medicine Amion for contact  02/14/2023, 9:14 AM

## 2023-03-13 NOTE — IPAL (Signed)
  Interdisciplinary Goals of Care Family Meeting   Date carried out: 02/28/2023  Location of the meeting: Bedside  Member's involved: Physician, Bedside Registered Nurse, and Family Member or next of kin  Durable Power of Attorney or acting medical decision maker: Both daughters, son-in-law  Discussion: We discussed goals of care for [name] .  As discussed in the past, plan for comfort measures.  They would like the afternoon to allow family members and others to visit.  Then, after visiting hours he would like to select certain visual to stay behind and be with patient her last moments.  Discussed medications to be used to keep her comfortable.  Discussed starting medications this afternoon to start the process and are not behind later this evening.  He expressed understanding.  New orders placed.  Discussed plan with bedside RN.  Code status:   Code Status: DNR   Disposition: In-patient comfort care  Time spent for the meeting: 10 minutes    Karren Burly, MD  02/11/2023, 2:28 PM

## 2023-03-13 NOTE — Procedures (Signed)
Extubation Procedure Note  Patient Details:   Name: [name] DOB: 1956-04-29 MRN: [id number]   Airway Documentation:    Vent end date: 03/11/2023 Vent end time: 2010   Evaluation  O2 sats: currently acceptable Complications: No apparent complications Patient did tolerate procedure well. Bilateral Breath Sounds: Diminished   No  Pt taken off of vent per MD order. Pt is currently on ATC 5L.   Max Sane 03/04/2023, 8:14 PM

## 2023-03-13 DEATH — deceased

## 2023-07-13 NOTE — Addendum Note (Signed)
Encounter addended by: Marcello Fennel, PA-C on: 07/13/2023 3:44 PM  Actions taken: Clinical Note Signed, Visit diagnoses modified
